# Patient Record
Sex: Female | Born: 1955 | Race: White | Hispanic: No | Smoking: Former smoker
Health system: Southern US, Community
[De-identification: ages and names within clinical notes are randomized; demographics above are authoritative.]

## PROBLEM LIST (undated history)

## (undated) DIAGNOSIS — I878 Other specified disorders of veins: Secondary | ICD-10-CM

## (undated) DIAGNOSIS — J449 Chronic obstructive pulmonary disease, unspecified: Secondary | ICD-10-CM

## (undated) DIAGNOSIS — M1990 Unspecified osteoarthritis, unspecified site: Secondary | ICD-10-CM

## (undated) DIAGNOSIS — R55 Syncope and collapse: Secondary | ICD-10-CM

## (undated) DIAGNOSIS — E119 Type 2 diabetes mellitus without complications: Secondary | ICD-10-CM

## (undated) DIAGNOSIS — F329 Major depressive disorder, single episode, unspecified: Secondary | ICD-10-CM

## (undated) DIAGNOSIS — M797 Fibromyalgia: Secondary | ICD-10-CM

## (undated) DIAGNOSIS — G2581 Restless legs syndrome: Secondary | ICD-10-CM

## (undated) DIAGNOSIS — G629 Polyneuropathy, unspecified: Secondary | ICD-10-CM

## (undated) DIAGNOSIS — E7800 Pure hypercholesterolemia, unspecified: Secondary | ICD-10-CM

## (undated) DIAGNOSIS — M79606 Pain in leg, unspecified: Secondary | ICD-10-CM

## (undated) DIAGNOSIS — G8929 Other chronic pain: Secondary | ICD-10-CM

## (undated) DIAGNOSIS — G4733 Obstructive sleep apnea (adult) (pediatric): Secondary | ICD-10-CM

## (undated) DIAGNOSIS — I10 Essential (primary) hypertension: Secondary | ICD-10-CM

## (undated) DIAGNOSIS — C649 Malignant neoplasm of unspecified kidney, except renal pelvis: Secondary | ICD-10-CM

## (undated) DIAGNOSIS — E039 Hypothyroidism, unspecified: Secondary | ICD-10-CM

## (undated) DIAGNOSIS — S129XXA Fracture of neck, unspecified, initial encounter: Secondary | ICD-10-CM

## (undated) DIAGNOSIS — N183 Chronic kidney disease, stage 3 unspecified: Secondary | ICD-10-CM

## (undated) DIAGNOSIS — G5600 Carpal tunnel syndrome, unspecified upper limb: Secondary | ICD-10-CM

## (undated) DIAGNOSIS — R1909 Other intra-abdominal and pelvic swelling, mass and lump: Secondary | ICD-10-CM

## (undated) DIAGNOSIS — I509 Heart failure, unspecified: Secondary | ICD-10-CM

## (undated) DIAGNOSIS — B1920 Unspecified viral hepatitis C without hepatic coma: Secondary | ICD-10-CM

## (undated) DIAGNOSIS — F32A Depression, unspecified: Secondary | ICD-10-CM

## (undated) HISTORY — DX: Malignant neoplasm of unspecified kidney, except renal pelvis: C64.9

## (undated) HISTORY — DX: Other chronic pain: G89.29

## (undated) HISTORY — PX: TONSILLECTOMY: SUR1361

## (undated) HISTORY — PX: ABDOMINAL HERNIA REPAIR: SHX539

## (undated) HISTORY — DX: Essential (primary) hypertension: I10

## (undated) HISTORY — DX: Major depressive disorder, single episode, unspecified: F32.9

## (undated) HISTORY — DX: Carpal tunnel syndrome, unspecified upper limb: G56.00

## (undated) HISTORY — PX: VAGINA SURGERY: SHX829

## (undated) HISTORY — PX: INCISIONAL HERNIA REPAIR: SHX193

## (undated) HISTORY — DX: Type 2 diabetes mellitus without complications: E11.9

## (undated) HISTORY — PX: WISDOM TOOTH EXTRACTION: SHX21

## (undated) HISTORY — DX: Hypothyroidism, unspecified: E03.9

## (undated) HISTORY — DX: Pain in leg, unspecified: M79.606

## (undated) HISTORY — DX: Obstructive sleep apnea (adult) (pediatric): G47.33

## (undated) HISTORY — DX: Other specified disorders of veins: I87.8

## (undated) HISTORY — DX: Morbid (severe) obesity due to excess calories: E66.01

## (undated) HISTORY — PX: BLADDER SUSPENSION: SHX72

## (undated) HISTORY — PX: ABDOMINAL HYSTERECTOMY: SHX81

## (undated) HISTORY — PX: CHOLECYSTECTOMY: SHX55

## (undated) HISTORY — PX: NEPHRECTOMY: SHX65

## (undated) HISTORY — PX: UMBILICAL HERNIA REPAIR: SHX196

## (undated) HISTORY — DX: Restless legs syndrome: G25.81

## (undated) HISTORY — PX: KNEE ARTHROSCOPY: SUR90

## (undated) HISTORY — DX: Syncope and collapse: R55

## (undated) HISTORY — PX: CYST EXCISION: SHX5701

## (undated) HISTORY — DX: Unspecified osteoarthritis, unspecified site: M19.90

## (undated) HISTORY — DX: Unspecified viral hepatitis C without hepatic coma: B19.20

## (undated) HISTORY — DX: Depression, unspecified: F32.A

## (undated) HISTORY — DX: Chronic kidney disease, stage 3 (moderate): N18.3

## (undated) HISTORY — DX: Polyneuropathy, unspecified: G62.9

## (undated) HISTORY — DX: Other intra-abdominal and pelvic swelling, mass and lump: R19.09

## (undated) HISTORY — DX: Chronic obstructive pulmonary disease, unspecified: J44.9

## (undated) HISTORY — DX: Chronic kidney disease, stage 3 unspecified: N18.30

## (undated) HISTORY — PX: TOOTH EXTRACTION: SUR596

## (undated) HISTORY — DX: Fibromyalgia: M79.7

## (undated) HISTORY — DX: Fracture of neck, unspecified, initial encounter: S12.9XXA

## (undated) HISTORY — DX: Pure hypercholesterolemia, unspecified: E78.00

---

## 2008-04-09 LAB — PROTIME-INR: INR: 1.3 — AB (ref 0.9–1.1)

## 2013-09-17 ENCOUNTER — Other Ambulatory Visit: Payer: Self-pay | Admitting: Pain Medicine

## 2013-09-17 DIAGNOSIS — M545 Low back pain, unspecified: Secondary | ICD-10-CM

## 2013-09-17 DIAGNOSIS — M542 Cervicalgia: Secondary | ICD-10-CM

## 2013-09-25 ENCOUNTER — Ambulatory Visit
Admission: RE | Admit: 2013-09-25 | Discharge: 2013-09-25 | Disposition: A | Discharge status: Home / Self Care | Payer: Commercial Managed Care - HMO | Source: Ambulatory Visit | Attending: Pain Medicine | Admitting: Pain Medicine

## 2013-09-25 DIAGNOSIS — M545 Low back pain, unspecified: Secondary | ICD-10-CM

## 2013-09-25 DIAGNOSIS — M542 Cervicalgia: Secondary | ICD-10-CM

## 2013-12-06 ENCOUNTER — Encounter: Payer: Self-pay | Admitting: Physician Assistant

## 2013-12-06 ENCOUNTER — Ambulatory Visit (INDEPENDENT_AMBULATORY_CARE_PROVIDER_SITE_OTHER): Payer: Commercial Managed Care - HMO | Admitting: Physician Assistant

## 2013-12-06 VITALS — BP 99/64 | HR 105 | Temp 98.1°F | Resp 16 | Ht 64.0 in | Wt 305.5 lb

## 2013-12-06 DIAGNOSIS — J449 Chronic obstructive pulmonary disease, unspecified: Secondary | ICD-10-CM

## 2013-12-06 DIAGNOSIS — Z13820 Encounter for screening for osteoporosis: Secondary | ICD-10-CM

## 2013-12-06 DIAGNOSIS — B354 Tinea corporis: Secondary | ICD-10-CM

## 2013-12-06 DIAGNOSIS — B182 Chronic viral hepatitis C: Secondary | ICD-10-CM

## 2013-12-06 DIAGNOSIS — Z794 Long term (current) use of insulin: Secondary | ICD-10-CM

## 2013-12-06 DIAGNOSIS — N189 Chronic kidney disease, unspecified: Secondary | ICD-10-CM

## 2013-12-06 DIAGNOSIS — L03119 Cellulitis of unspecified part of limb: Secondary | ICD-10-CM

## 2013-12-06 DIAGNOSIS — N3000 Acute cystitis without hematuria: Secondary | ICD-10-CM

## 2013-12-06 DIAGNOSIS — L02419 Cutaneous abscess of limb, unspecified: Secondary | ICD-10-CM

## 2013-12-06 DIAGNOSIS — J4489 Other specified chronic obstructive pulmonary disease: Secondary | ICD-10-CM

## 2013-12-06 DIAGNOSIS — E119 Type 2 diabetes mellitus without complications: Secondary | ICD-10-CM

## 2013-12-06 DIAGNOSIS — Z1239 Encounter for other screening for malignant neoplasm of breast: Secondary | ICD-10-CM

## 2013-12-06 DIAGNOSIS — E785 Hyperlipidemia, unspecified: Secondary | ICD-10-CM

## 2013-12-06 DIAGNOSIS — L03116 Cellulitis of left lower limb: Secondary | ICD-10-CM

## 2013-12-06 LAB — POCT URINALYSIS DIPSTICK
BILIRUBIN UA: NEGATIVE
GLUCOSE UA: 1000
Ketones, UA: NEGATIVE
NITRITE UA: NEGATIVE
Spec Grav, UA: 1.015
Urobilinogen, UA: 0.2
pH, UA: 6

## 2013-12-06 MED ORDER — CEPHALEXIN 500 MG PO CAPS
500.0000 mg | ORAL_CAPSULE | Freq: Two times a day (BID) | ORAL | Status: DC
Start: 2013-12-06 — End: 2013-12-07

## 2013-12-06 NOTE — Progress Notes (Signed)
Pre visit review using our clinic review tool, if applicable. No additional management support is needed unless otherwise documented below in the visit note/SLS  

## 2013-12-06 NOTE — Patient Instructions (Signed)
You will be contacted by the Pulmonologist and the Gastroenterologist for evaluation.  Please continue medications as directed.  Take Keflex as directed.  Stay well hydrated.  Take a probiotic and a cranberry supplement.  I will call you with your urine culture results.  We will switch antibiotic if indicated.  I will also call you once I have received your records from your previous doctor.     Cellulitis Cellulitis is an infection of the skin and the tissue beneath it. The infected area is usually red and tender. Cellulitis occurs most often in the arms and lower legs.  CAUSES  Cellulitis is caused by bacteria that enter the skin through cracks or cuts in the skin. The most common types of bacteria that cause cellulitis are staphylococci and streptococci. SIGNS AND SYMPTOMS   Redness and warmth.  Swelling.  Tenderness or pain.  Fever. DIAGNOSIS  Your health care provider can usually determine what is wrong based on a physical exam. Blood tests may also be done. TREATMENT  Treatment usually involves taking an antibiotic medicine. HOME CARE INSTRUCTIONS   Take your antibiotic medicine as directed by your health care provider. Finish the antibiotic even if you start to feel better.  Keep the infected arm or leg elevated to reduce swelling.  Apply a warm cloth to the affected area up to 4 times per day to relieve pain.  Take medicines only as directed by your health care provider.  Keep all follow-up visits as directed by your health care provider. SEEK MEDICAL CARE IF:   You notice red streaks coming from the infected area.  Your red area gets larger or turns dark in color.  Your bone or joint underneath the infected area becomes painful after the skin has healed.  Your infection returns in the same area or another area.  You notice a swollen bump in the infected area.  You develop new symptoms.  You have a fever. SEEK IMMEDIATE MEDICAL CARE IF:   You feel very  sleepy.  You develop vomiting or diarrhea.  You have a general ill feeling (malaise) with muscle aches and pains. MAKE SURE YOU:   Understand these instructions.  Will watch your condition.  Will get help right away if you are not doing well or get worse. Document Released: 02/10/2005 Document Revised: 09/17/2013 Document Reviewed: 07/19/2011 [facility] Patient Information 2015 [place], [place]. This information is not intended to replace advice given to you by your health care provider. Make sure you discuss any questions you have with your health care provider.

## 2013-12-06 NOTE — Progress Notes (Signed)
Patient presents to clinic today to establish care.  Acute Concerns: Patient complains of dysuria, urinary urgency, urinary frequency over the past week.  Denies hematuria, N/V, fever, chills or low back pain.  Patient also c/o pruritic yeast infection under the skin folds of abdomen, breast and in the inguinal region bilaterally.  Patient with history of epidermal candidiasis.  Is a diabetic.  Also with hepatitis C and unknown liver function status. Denies other immunocompromised state, such as HIV.  Chronic Issues: Type II Diabetes Mellitus -- Diagnosed in 2001.  Is currently on Lantus 30 units qam, Humalog 15 units TID, and Januvia 50 mg daily.  Endorses A1C last month < 9. Endorses AM glucose 100-110 and PM glucose ~ 120. Body mass index is 52.41 kg/(m^2)..  Is not currently followed by Endocrinology.  Overdue for Ophthalmologic exam. Endorses diabetic neuropathy for which her  Nortriptyline, taken for chronic pain, helps.  Hypothyroidism -- Unspecified cause.   Patient currently on Levothyroxine (generic) 75 mcg daily.  Endorses recent TSH with her previous PCP was good.   Hyperlipidemia -- Currently on Pravastatin.  Denies myalgias.  Has chronic back pain.  Patient with history of Hepatitis C but denies significant liver impairment.  Will need lipid panel and LFTs.  Will need records from previous PCP as patient endorses just having her physical.  COPD -- Unspecified stage.  Has never seen Pulmonology.  Endorses that her symptoms are predominantly due to chronic bronchitis and not emphysema.  Is currently on Albuterol PRN, Spiriva and Advair daily, and uses O2 at night and with ambulation.  O2 currently set on 2 L/min with good saturation.  Patient unsure if she has ever had PFTs.  Peripheral Edema -- denies hx of CHF.  Endorses history of CKD.  Possibly due to weight and other comorbidity.  Patient takes Lasix occasionally for edema.   Hepatitis C -- Endorses hx.  Denies known hepatic  impairment.  Will need records from previous PCP.  Patient denies seeing GI.  Will need referral.   Health Maintenance: Dental -- up-to-date Vision -- Overdue.  Needs Ophthalmologist. Immunizations --  Tetanus in 2007.   Colonoscopy --  Last Colonoscopy in 2010; no abnormal findings.  Due in 2020 Mammogram -- Due for mammogram.  Will send to [facility].  PAP -- s/p hysterectomy.  Followed by OB/GYN.  Bone Density -- Last DEXA in 2003.  No diagnosis of osteoporosis.  Patient s/p hysterectomy in  2010.   Past Medical History  Diagnosis Date  . Diabetes     Type II  . Neuropathy     Diabetes  . COPD (chronic obstructive pulmonary disease)   . Renal cancer     Left Kidney Removed  . Fibromyalgia   . Hepatitis C   . Abdominal mass of other site   . Chronic kidney disease, stage 3     Borderline Stage 2-3  . Depression   . Cervical compression fracture   . Hypercholesteremia   . Syncope   . Osteoarthritis   . Hypertension   . Hypothyroidism   . Chronic lower limb pain   . Morbid obesity   . CTS (carpal tunnel syndrome)   . RLS (restless legs syndrome)   . Venous stasis   . OSA (obstructive sleep apnea)     Past Surgical History  Procedure Laterality Date  . Nephrectomy      Left  . Umbilical hernia repair    . Abdominal hernia repair  x2  . Incisional hernia repair    . Abdominal hysterectomy    . Bladder suspension    . Vagina surgery    . Cholecystectomy    . Wisdom tooth extraction    . Tooth extraction    . Tonsillectomy    . Knee arthroscopy      Bilateral  . Cyst excision      Head    No current outpatient prescriptions on file prior to visit.   No current facility-administered medications on file prior to visit.    Allergies  Allergen Reactions  . Gabapentin Anaphylaxis  . Ketorolac Tromethamine Hives  . Lisinopril Cough    Family History  Problem Relation Age of Onset  . Heart attack Mother 2    Deceased  . Heart disease Mother    . Emphysema Mother   . COPD Father 68    Deceased  . Emphysema Father   . Alcoholism Father   . Alcoholism Mother   . Esophageal varices Father   . Alcoholism Paternal Grandfather   . Diabetes Maternal Grandmother   . Heart disease Maternal Grandmother   . Lung cancer Maternal Grandfather   . Emphysema Maternal Grandfather   . Brain cancer Maternal Aunt   . Diabetes Sister   . Heart defect Sister   . Obesity Son   . Breast cancer Maternal Aunt     History   Social History  . Marital Status: Single    Spouse Name: N/A    Number of Children: N/A  . Years of Education: N/A   Occupational History  . Not on file.   Social History Main Topics  . Smoking status: Former Smoker -- 2.50 packs/day for 45 years    Types: Cigarettes    Quit date: 09/25/2013  . Smokeless tobacco: Not on file  . Alcohol Use: Not on file  . Drug Use: Not on file  . Sexual Activity: Not on file   Other Topics Concern  . Not on file   Social History Narrative  . No narrative on file   ROS See HPI.  All other ROS are negative.  BP 99/64  Pulse 105  Temp(Src) 98.1 F (36.7 C) (Oral)  Resp 16  Ht 5\' 4"  (1.626 m)  Wt 305 lb 8 oz (138.574 kg)  BMI 52.41 kg/m2  SpO2 94%  Physical Exam  Vitals reviewed. Constitutional: She is oriented to person, place, and time and well-developed, well-nourished, and in no distress.  HENT:  Head: Normocephalic and atraumatic.  Right Ear: External ear normal.  Left Ear: External ear normal.  Nose: Nose normal.  Mouth/Throat: Oropharynx is clear and moist. No oropharyngeal exudate.  TM within normal limits bilaterally  Eyes: Conjunctivae are normal. Pupils are equal, round, and reactive to light.  Neck: Neck supple. No thyromegaly present.  Cardiovascular: Normal rate, regular rhythm, normal heart sounds and intact distal pulses.   Pulmonary/Chest: Effort normal. No respiratory distress. She has no wheezes. She has no rales. She exhibits no tenderness.   Lymphadenopathy:    She has no cervical adenopathy.  Neurological: She is alert and oriented to person, place, and time.  Skin: Skin is warm and dry.  Presence of tinea corporis rash of axillary regions, inguinal regions, and underneath breasts and abdominal pannus.  Presence of erythema, warmth and tenderness of left inguinal region; consistent with cellulitis.   Assessment/Plan: COPD (chronic obstructive pulmonary disease) with chronic bronchitis Continue current medication regimen.  Referral placed to Pulmonology for  evaluation and formal PFTs.  Chronic hepatitis C without hepatic coma Patient endorses hx of Hep C.  Thinks she has chronic.  Will check CMP for liver function and refer to a hepatic specialist for further workup and treatment.   Diabetes mellitus, type II, insulin dependent Continue current regimen.  Will obtain CMP and urine microalbumin.  Will obtain records from previous PCP to assess recent A1C.  Referral placed to Ophthalmology for diabetic eye examination.  Discussed appropriate foot care.  Continue Nortriptyline for neuropathy.  Acute cystitis without hematuria Ua + for nitrites and LE.  Will treat with Keflex. Will obtain culture.  Maintain hydration.  Daily probiotic and cranberry supplement.  CKD (chronic kidney disease) Will obtain CMP today. Will obtain records from previous PCP.   Cellulitis of leg, left Rx Keflex. Follow-up 1 week.  Breast cancer screening Order from screening mammogram placed.  Screening for osteoporosis DEXA ordered.  Hyperlipidemia LDL goal <100 Will check LFTS.  Will obtain records from previous PCP to see recent lipid panel.  Tinea corporis Rx lotrisone.  Continue Nystatin powder.  Hygiene measures discussed.  May need oral agent but will hold off for now until topical measure is attempted and for CMP to result.

## 2013-12-07 ENCOUNTER — Telehealth: Payer: Self-pay | Admitting: Physician Assistant

## 2013-12-07 MED ORDER — CEPHALEXIN 500 MG PO CAPS: 500.0000 mg | ORAL_CAPSULE | Freq: Two times a day (BID) | ORAL | Status: DC

## 2013-12-07 NOTE — Telephone Encounter (Signed)
Received medical records from [facility]

## 2013-12-09 LAB — CULTURE, URINE COMPREHENSIVE

## 2013-12-16 DIAGNOSIS — E1129 Type 2 diabetes mellitus with other diabetic kidney complication: Secondary | ICD-10-CM | POA: Insufficient documentation

## 2013-12-16 DIAGNOSIS — Z8619 Personal history of other infectious and parasitic diseases: Secondary | ICD-10-CM | POA: Insufficient documentation

## 2013-12-16 DIAGNOSIS — N189 Chronic kidney disease, unspecified: Secondary | ICD-10-CM | POA: Insufficient documentation

## 2013-12-16 DIAGNOSIS — L03116 Cellulitis of left lower limb: Secondary | ICD-10-CM | POA: Insufficient documentation

## 2013-12-16 DIAGNOSIS — N3000 Acute cystitis without hematuria: Secondary | ICD-10-CM | POA: Insufficient documentation

## 2013-12-16 DIAGNOSIS — Z13820 Encounter for screening for osteoporosis: Secondary | ICD-10-CM | POA: Insufficient documentation

## 2013-12-16 DIAGNOSIS — E785 Hyperlipidemia, unspecified: Secondary | ICD-10-CM | POA: Insufficient documentation

## 2013-12-16 DIAGNOSIS — J449 Chronic obstructive pulmonary disease, unspecified: Secondary | ICD-10-CM | POA: Insufficient documentation

## 2013-12-16 DIAGNOSIS — Z1239 Encounter for other screening for malignant neoplasm of breast: Secondary | ICD-10-CM | POA: Insufficient documentation

## 2013-12-16 DIAGNOSIS — B354 Tinea corporis: Secondary | ICD-10-CM | POA: Insufficient documentation

## 2013-12-16 NOTE — Assessment & Plan Note (Signed)
DEXA ordered.  

## 2013-12-16 NOTE — Assessment & Plan Note (Signed)
Rx Keflex. Follow-up 1 week.

## 2013-12-16 NOTE — Assessment & Plan Note (Signed)
Continue current medication regimen.  Referral placed to Pulmonology for evaluation and formal PFTs.

## 2013-12-16 NOTE — Assessment & Plan Note (Signed)
Ua + for nitrites and LE.  Will treat with Keflex. Will obtain culture.  Maintain hydration.  Daily probiotic and cranberry supplement.

## 2013-12-16 NOTE — Assessment & Plan Note (Signed)
Rx lotrisone.  Continue Nystatin powder.  Hygiene measures discussed.  May need oral agent but will hold off for now until topical measure is attempted and for CMP to result.

## 2013-12-16 NOTE — Assessment & Plan Note (Signed)
Will obtain CMP today. Will obtain records from previous PCP.

## 2013-12-16 NOTE — Assessment & Plan Note (Signed)
Patient endorses hx of Hep C.  Thinks she has chronic.  Will check CMP for liver function and refer to a hepatic specialist for further workup and treatment.

## 2013-12-16 NOTE — Assessment & Plan Note (Signed)
Continue current regimen.  Will obtain CMP and urine microalbumin.  Will obtain records from previous PCP to assess recent A1C.  Referral placed to Ophthalmology for diabetic eye examination.  Discussed appropriate foot care.  Continue Nortriptyline for neuropathy.

## 2013-12-16 NOTE — Assessment & Plan Note (Signed)
Order from screening mammogram placed.

## 2013-12-16 NOTE — Assessment & Plan Note (Signed)
Will check LFTS.  Will obtain records from previous PCP to see recent lipid panel.

## 2013-12-25 ENCOUNTER — Encounter: Payer: Self-pay | Admitting: Internal Medicine

## 2013-12-25 ENCOUNTER — Ambulatory Visit (INDEPENDENT_AMBULATORY_CARE_PROVIDER_SITE_OTHER): Payer: Commercial Managed Care - HMO | Admitting: Internal Medicine

## 2013-12-25 VITALS — BP 126/68 | HR 108 | Ht 65.0 in | Wt 296.0 lb

## 2013-12-25 DIAGNOSIS — J449 Chronic obstructive pulmonary disease, unspecified: Secondary | ICD-10-CM

## 2013-12-25 NOTE — Patient Instructions (Addendum)
Continue spiriva each am automatically   Only use your albuterol (proair) as a rescue medication to be used if you can't catch your breath by resting or doing a relaxed purse lip breathing pattern.  - The less you use it, the better it will work when you need it. - Ok to use up to 2 puffs  every 4 hours if you must but call for immediate appointment if use goes up over your usual need - Don't leave home without it !!  (think of it like the spare tire for your car)   Only use your albuterol nebulizer after trying the proair first   Adjust the 02 for a sat over 90% but continue at bedtime x 2.5 lpm   Please schedule a follow up office visit in 4 weeks, sooner if needed for pfts on return Add needs cxr on return

## 2013-12-25 NOTE — Progress Notes (Signed)
   Subjective:    Patient ID: Renee Noble, female    DOB: [birth year],    MRN: [id number]  HPI  16 yowf quit smoking 09/2013 Mothers day referred by Renee Noble to pulmonary clinic for copd evaluation 12/25/2013   12/25/2013 1st [facility] office visit/ Renee Noble on spiriva and saba rarely/ not consistent with advair Chief Complaint  Patient presents with  . Advice Only    Referred by Renee Noble for COPD.  Was dx'ed with copd in 2000. Has been on cpap X13 years.    doe x whole HT some better on 02, worse in heat  Did have a lot of chest congestion now better since quit smoking  No obvious other patterns in day to day or daytime variabilty or cp or chest tightness, subjective wheeze overt sinus or hb symptoms. No unusual exp hx or h/o childhood pna/ asthma or knowledge of premature birth.  Sleeping ok without nocturnal  or early am exacerbation  of respiratory  c/o's or need for noct saba. Also denies any obvious fluctuation of symptoms with weather or environmental changes or other aggravating or alleviating factors except as outlined above   Current Medications, Allergies, Complete Past Medical History, Past Surgical History, Family History, and Social History were reviewed in [employer] record.             Review of Systems  Constitutional: Negative for fever and unexpected weight change.  HENT: Negative for congestion, dental problem, ear pain, nosebleeds, postnasal drip, rhinorrhea, sinus pressure, sneezing, sore throat and trouble swallowing.   Eyes: Negative for redness and itching.  Respiratory: Positive for chest tightness, shortness of breath and wheezing. Negative for cough.   Cardiovascular: Negative for palpitations and leg swelling.  Gastrointestinal: Negative for nausea and vomiting.  Genitourinary: Negative for dysuria.  Musculoskeletal: Negative for joint swelling.  Skin: Negative for rash.  Neurological: Negative for headaches.    Hematological: Does not bruise/bleed easily.  Psychiatric/Behavioral: Negative for dysphoric mood. The patient is not nervous/anxious.        Objective:   Physical Exam  Wt Readings from Last 3 Encounters:  12/25/13 296 lb (134.265 kg)  12/06/13 305 lb 8 oz (138.574 kg)    amb obese wf nad  Full dentures  HEENT: nl dentition, turbinates, and orophanx. Nl external ear canals without cough reflex   NECK :  without JVD/Nodes/TM/ nl carotid upstrokes bilaterally   LUNGS: no acc muscle use, clear to A and P bilaterally without cough on insp or exp maneuvers   CV:  RRR  no s3 or murmur or increase in P2, no edema   ABD:  soft and nontender with nl excursion in the supine position. No bruits or organomegaly, bowel sounds nl  MS:  warm without deformities, calf tenderness, cyanosis or clubbing  SKIN: warm and dry without lesions    NEURO:  alert, approp, no deficits    cxr not on file            Assessment & Plan:

## 2013-12-27 NOTE — Assessment & Plan Note (Addendum)
She appears to have at least moderate dz though her weight probably contributing just as much to her doe - note Try ok to try  off advair   as not using it consistently anyway and now that not smoking does not have much in terms of an AB component anyway   Reinforced importance of maintaining off cigs at all cost and returning for full pfts in one month    Each maintenance medication was reviewed in detail including most importantly the difference between maintenance and as needed and under what circumstances the prns are to be used.  Please see instructions for details which were reviewed in writing and the patient given a copy.    The proper method of use, as well as anticipated side effects, of a metered-dose inhaler are discussed and demonstrated to the patient. Improved effectiveness after extensive coaching during this visit to a level of approximately  75%

## 2013-12-27 NOTE — Addendum Note (Signed)
Addended by: Christinia Gully B on: 12/27/2013 07:24 AM   Modules accepted: Orders

## 2014-01-02 ENCOUNTER — Encounter: Payer: Self-pay | Admitting: Physician Assistant

## 2014-01-15 ENCOUNTER — Telehealth: Payer: Self-pay | Admitting: Physician Assistant

## 2014-01-15 DIAGNOSIS — E2839 Other primary ovarian failure: Secondary | ICD-10-CM

## 2014-01-15 NOTE — Telephone Encounter (Signed)
Order changed.

## 2014-01-25 ENCOUNTER — Ambulatory Visit: Payer: Commercial Managed Care - HMO | Admitting: Internal Medicine

## 2014-02-14 ENCOUNTER — Encounter: Payer: Self-pay | Admitting: Internal Medicine

## 2014-02-14 ENCOUNTER — Ambulatory Visit (INDEPENDENT_AMBULATORY_CARE_PROVIDER_SITE_OTHER): Payer: Commercial Managed Care - HMO | Admitting: Internal Medicine

## 2014-02-14 ENCOUNTER — Ambulatory Visit (INDEPENDENT_AMBULATORY_CARE_PROVIDER_SITE_OTHER)
Admission: RE | Admit: 2014-02-14 | Discharge: 2014-02-14 | Disposition: A | Discharge status: Home / Self Care | Payer: Commercial Managed Care - HMO | Source: Ambulatory Visit | Attending: Internal Medicine | Admitting: Internal Medicine

## 2014-02-14 VITALS — BP 120/90 | HR 106 | Temp 98.1°F | Ht 64.0 in | Wt 300.0 lb

## 2014-02-14 DIAGNOSIS — R938 Abnormal findings on diagnostic imaging of other specified body structures: Secondary | ICD-10-CM

## 2014-02-14 DIAGNOSIS — J449 Chronic obstructive pulmonary disease, unspecified: Secondary | ICD-10-CM

## 2014-02-14 DIAGNOSIS — R9389 Abnormal findings on diagnostic imaging of other specified body structures: Secondary | ICD-10-CM

## 2014-02-14 LAB — PULMONARY FUNCTION TEST
DL/VA % pred: 84 %
DL/VA: 4.07 ml/min/mmHg/L
DLCO UNC % PRED: 73 %
DLCO UNC: 17.79 ml/min/mmHg
FEF 25-75 Post: 0.57 L/sec
FEF 25-75 Pre: 0.42 L/sec
FEF2575-%Change-Post: 33 %
FEF2575-%Pred-Post: 24 %
FEF2575-%Pred-Pre: 18 %
FEV1-%Change-Post: 12 %
FEV1-%PRED-POST: 48 %
FEV1-%Pred-Pre: 42 %
FEV1-Post: 1.22 L
FEV1-Pre: 1.09 L
FEV1FVC-%CHANGE-POST: 6 %
FEV1FVC-%Pred-Pre: 64 %
FEV6-%Change-Post: 5 %
FEV6-%PRED-PRE: 65 %
FEV6-%Pred-Post: 69 %
FEV6-POST: 2.2 L
FEV6-PRE: 2.08 L
FEV6FVC-%CHANGE-POST: 0 %
FEV6FVC-%Pred-Post: 99 %
FEV6FVC-%Pred-Pre: 99 %
FVC-%CHANGE-POST: 5 %
FVC-%PRED-PRE: 65 %
FVC-%Pred-Post: 69 %
FVC-PRE: 2.17 L
FVC-Post: 2.29 L
POST FEV6/FVC RATIO: 96 %
PRE FEV6/FVC RATIO: 96 %
Post FEV1/FVC ratio: 53 %
Pre FEV1/FVC ratio: 50 %
RV % PRED: 185 %
RV: 3.72 L
TLC % PRED: 120 %
TLC: 6.07 L

## 2014-02-14 NOTE — Patient Instructions (Addendum)
Weight control is simply a matter of calorie balance which needs to be tilted in your favor by eating less and exercising more.  To get the most out of exercise, you need to be continuously aware that you are short of breath, but never out of breath, for 30 minutes daily. As you improve, it will actually be easier for you to do the same amount of exercise  in  30 minutes so always push to the level where you are short of breath.  If this does not result in gradual weight reduction then I strongly recommend you see a nutritionist with a food diary x 2 weeks so that we can work out a negative calorie balance which is universally effective in steady weight loss programs.  Think of your calorie balance like you do your bank account where in this case you want the balance to go down so you must take in less calories than you burn up.  It's just that simple:  Hard to do, but easy to understand.  Good luck!   No change in medications   Call us if desire referral to pulmonary rehab    If you are satisfied with your treatment plan,  let your doctor know and he/she can either refill your medications or you can return here when your prescription runs out.     If in any way you are not 100% satisfied,  please tell us.  If 100% better, tell your friends!  Please remember to go to the   x-ray department downstairs for your tests - we will call you with the results when they are available.     Pulmonary follow up is as needed

## 2014-02-14 NOTE — Progress Notes (Signed)
Subjective:    Patient ID: Renee Noble, female    DOB: [birth year],    MRN: [id number]    Brief patient profile:  58 yowf quit smoking 09/2013 on  Mothers day referred by [name] to pulmonary clinic for copd evaluation 12/25/2013   12/25/2013 1st [facility] office visit/ Wert on spiriva and saba rarely/ not consistent with advair Chief Complaint  Patient presents with  . Advice Only    Referred by [name] for COPD.  Was dx'ed with copd in 2000. Has been on cpap X13 years.   doe x whole HT some better on 02, worse in heat  Did have a lot of chest congestion now better since quit smoking rec Continue spiriva each am automatically  Only use your albuterol (proair) as a rescue medication  Only use your albuterol nebulizer after trying the proair first  Adjust the 02 for a sat over 90% but continue at bedtime x 2.5 lpm       02/14/2014 f/u ov/Wert re: GOLD III spiriva and advair  Chief Complaint  Patient presents with  . Follow-up    PFT done today. Pt states that her breathing is doing well. She uses rescue inhaler approx 1 x per wk.   doe a little better since started back on advair and only use saba maybe once a week    Not using 02 at all    No obvious day to day or daytime variabilty or assoc chronic cough or cp or chest tightness, subjective wheeze overt sinus or hb symptoms. No unusual exp hx or h/o childhood pna/ asthma or knowledge of premature birth.  Sleeping ok without nocturnal  or early am exacerbation  of respiratory  c/o's or need for noct saba. Also denies any obvious fluctuation of symptoms with weather or environmental changes or other aggravating or alleviating factors except as outlined above   Current Medications, Allergies, Complete Past Medical History, Past Surgical History, Family History, and Social History were reviewed in [employer] record.  ROS  The following are not active complaints unless bolded sore throat,  dysphagia, dental problems, itching, sneezing,  nasal congestion or excess/ purulent secretions, ear ache,   fever, chills, sweats, unintended wt loss, pleuritic or exertional cp, hemoptysis,  orthopnea pnd or leg swelling, presyncope, palpitations, heartburn, abdominal pain, anorexia, nausea, vomiting, diarrhea  or change in bowel or urinary habits, change in stools or urine, dysuria,hematuria,  rash, arthralgias, visual complaints, headache, numbness weakness or ataxia or problems with walking or coordination,  change in mood/affect or memory.                        Objective:   Physical Exam   Wt Readings from Last 3 Encounters:  02/14/14 300 lb (136.079 kg)  12/25/13 296 lb (134.265 kg)  12/06/13 305 lb 8 oz (138.574 kg)       amb obese wf nad    HEENT: Full dentures -  nl  turbinates, and orophanx. Nl external ear canals without cough reflex   NECK :  without JVD/Nodes/TM/ nl carotid upstrokes bilaterally   LUNGS: no acc muscle use, clear to A and P bilaterally without cough on insp or exp maneuvers   CV:  RRR  no s3 or murmur or increase in P2, no edema   ABD:  soft and nontender with nl excursion in the supine position. No bruits or organomegaly, bowel sounds nl  MS:  warm without  deformities, calf tenderness, cyanosis or clubbing  SKIN: warm and dry without lesions    NEURO:  alert, approp, no deficits      CXR  02/14/2014 :  Left basilar opacity may reflect acute process such as pneumonia,  although partial lobar collapse or mass is included on the  differential            Assessment & Plan:

## 2014-02-14 NOTE — Assessment & Plan Note (Addendum)
-   02/14/2014 PFTs  FEV1  1.22 (48%) ratio 53 p 12% improvement from SABA and dlco 73% corrects to 84  - 02/15/2014  Walked RA  2 laps @ 185 ft each stopped due to  89% /   nl pace / legs gave out same time chest got "tight" relieved immediately at rest   Adequate control on present rx, reviewed > no change in rx needed

## 2014-02-14 NOTE — Progress Notes (Signed)
PFT done today. 

## 2014-02-15 ENCOUNTER — Encounter: Payer: Self-pay | Admitting: Internal Medicine

## 2014-02-15 ENCOUNTER — Telehealth: Payer: Self-pay | Admitting: Internal Medicine

## 2014-02-15 DIAGNOSIS — R9389 Abnormal findings on diagnostic imaging of other specified body structures: Secondary | ICD-10-CM | POA: Insufficient documentation

## 2014-02-15 NOTE — Progress Notes (Signed)
Quick Note:  Dr Melvyn Novas has called and discussed this with the pt ______

## 2014-02-15 NOTE — Assessment & Plan Note (Signed)
?   Hernia vs atx > CT chest rec

## 2014-04-16 ENCOUNTER — Other Ambulatory Visit: Payer: Self-pay | Admitting: Physician Assistant

## 2014-04-16 NOTE — Telephone Encounter (Signed)
Caller name: Loghan, Subia Relation to pt: self  Call back number: [phone] Pharmacy: [facility]   Reason for call:  levothyroxine (SYNTHROID, LEVOTHROID) 75 MCG tablet  tiotropium (SPIRIVA) 18 MCG inhalation capsule  nortriptyline (PAMELOR) 75 MG capsule pravastatin (PRAVACHOL) 20 MG tablet  albuterol (PROVENTIL HFA;VENTOLIN HFA) 108 (90 BASE) MCG/ACT inhaler Fluticasone-Salmeterol (ADVAIR) 500-50 MCG/DOSE AEPB insulin lispro (HUMALOG) 100 UNIT/ML injection insulin glargine (LANTUS) 100 UNIT/ML injection zolpidem (AMBIEN) 10 MG tablet sitaGLIPtin (JANUVIA) 50 MG tablet insulin needles 1 millimeters length 12.7 millimeters 30 gage needle

## 2014-04-17 NOTE — Telephone Encounter (Signed)
Cellulitis of leg, left - [name], PA-C at 12/16/2013 6:32 PM     Status: Written Related Problem: Cellulitis of leg, left   Expand All Collapse All   Rx Keflex. Follow-up 1 week

## 2014-04-23 ENCOUNTER — Telehealth: Payer: Self-pay

## 2014-04-23 MED ORDER — "INSULIN SYRINGE 30G X 1/2"" 0.5 ML MISC": Status: DC

## 2014-04-23 MED ORDER — INSULIN GLARGINE 100 UNIT/ML ~~LOC~~ SOLN: 30.0000 [IU] | Freq: Every day | SUBCUTANEOUS | Status: DC

## 2014-04-23 MED ORDER — ZOLPIDEM TARTRATE 10 MG PO TABS: 10.0000 mg | ORAL_TABLET | Freq: Every evening | ORAL | Status: DC | PRN

## 2014-04-23 MED ORDER — SITAGLIPTIN PHOSPHATE 50 MG PO TABS: 50.0000 mg | ORAL_TABLET | Freq: Every day | ORAL | Status: DC

## 2014-04-23 MED ORDER — TIOTROPIUM BROMIDE MONOHYDRATE 18 MCG IN CAPS: 18.0000 ug | ORAL_CAPSULE | Freq: Every day | RESPIRATORY_TRACT | Status: DC

## 2014-04-23 MED ORDER — FLUTICASONE-SALMETEROL 500-50 MCG/DOSE IN AEPB: 1.0000 | INHALATION_SPRAY | Freq: Once | RESPIRATORY_TRACT | Status: DC

## 2014-04-23 MED ORDER — INSULIN LISPRO 100 UNIT/ML ~~LOC~~ SOLN: 15.0000 [IU] | Freq: Three times a day (TID) | SUBCUTANEOUS | Status: DC

## 2014-04-23 MED ORDER — ALBUTEROL SULFATE HFA 108 (90 BASE) MCG/ACT IN AERS: 1.0000 | INHALATION_SPRAY | Freq: Four times a day (QID) | RESPIRATORY_TRACT | Status: DC | PRN

## 2014-04-23 MED ORDER — NORTRIPTYLINE HCL 75 MG PO CAPS: 75.0000 mg | ORAL_CAPSULE | Freq: Every day | ORAL | Status: DC

## 2014-04-23 MED ORDER — PRAVASTATIN SODIUM 20 MG PO TABS: 20.0000 mg | ORAL_TABLET | Freq: Every day | ORAL | Status: DC

## 2014-04-23 MED ORDER — LEVOTHYROXINE SODIUM 75 MCG PO TABS: 75.0000 ug | ORAL_TABLET | Freq: Every day | ORAL | Status: DC

## 2014-04-23 NOTE — Telephone Encounter (Signed)
Patient does not have a voice mail set up with [facility] phone.  Needs to schedule appt for CPE. Can send in 30 day refills to local pharmacy until she can be seen in office with [facility].

## 2014-04-23 NOTE — Telephone Encounter (Signed)
Per [name] to refill pts medications only for one month since pt is overdue for appointment. Spoke with pts relative margaret ,  pt to call back to schedule follow - up appointment.

## 2014-04-25 NOTE — Telephone Encounter (Signed)
Patient does not have a voice mail set up on [name] phone

## 2014-04-28 ENCOUNTER — Other Ambulatory Visit: Payer: Commercial Managed Care - HMO

## 2014-04-29 ENCOUNTER — Ambulatory Visit: Payer: Commercial Managed Care - HMO | Admitting: Physician Assistant

## 2014-04-29 ENCOUNTER — Telehealth: Payer: Self-pay | Admitting: *Deleted

## 2014-04-29 NOTE — Telephone Encounter (Signed)
Pt did not show for appointment 04/29/2014 at 3:30pm for med check. Pt called 04/29/14 at 2:21pm and rescheduled for 04/30/14 at 4:00pm

## 2014-04-29 NOTE — Telephone Encounter (Signed)
Patient is scheduled to see PCP 04/30/2014. Ambien Rx returned to provider.

## 2014-04-30 ENCOUNTER — Encounter: Payer: Self-pay | Admitting: Physician Assistant

## 2014-04-30 ENCOUNTER — Ambulatory Visit (INDEPENDENT_AMBULATORY_CARE_PROVIDER_SITE_OTHER): Payer: Commercial Managed Care - HMO | Admitting: Physician Assistant

## 2014-04-30 VITALS — BP 120/71 | HR 99 | Temp 99.0°F | Resp 16 | Ht 64.0 in | Wt 295.4 lb

## 2014-04-30 DIAGNOSIS — E785 Hyperlipidemia, unspecified: Secondary | ICD-10-CM

## 2014-04-30 DIAGNOSIS — Z794 Long term (current) use of insulin: Secondary | ICD-10-CM

## 2014-04-30 DIAGNOSIS — B354 Tinea corporis: Secondary | ICD-10-CM

## 2014-04-30 DIAGNOSIS — E119 Type 2 diabetes mellitus without complications: Secondary | ICD-10-CM

## 2014-04-30 DIAGNOSIS — N189 Chronic kidney disease, unspecified: Secondary | ICD-10-CM

## 2014-04-30 DIAGNOSIS — B182 Chronic viral hepatitis C: Secondary | ICD-10-CM

## 2014-04-30 DIAGNOSIS — J449 Chronic obstructive pulmonary disease, unspecified: Secondary | ICD-10-CM

## 2014-04-30 MED ORDER — NORTRIPTYLINE HCL 75 MG PO CAPS: 75.0000 mg | ORAL_CAPSULE | Freq: Every day | ORAL | Status: DC

## 2014-04-30 MED ORDER — INSULIN LISPRO 100 UNIT/ML ~~LOC~~ SOLN: 15.0000 [IU] | Freq: Three times a day (TID) | SUBCUTANEOUS | Status: DC

## 2014-04-30 MED ORDER — "INSULIN SYRINGE-NEEDLE U-100 30G X 1/2"" 1 ML MISC": Status: DC

## 2014-04-30 MED ORDER — FUROSEMIDE 20 MG PO TABS: 20.0000 mg | ORAL_TABLET | Freq: Every day | ORAL | Status: DC | PRN

## 2014-04-30 MED ORDER — FLUTICASONE-SALMETEROL 500-50 MCG/DOSE IN AEPB: 1.0000 | INHALATION_SPRAY | Freq: Once | RESPIRATORY_TRACT | Status: DC

## 2014-04-30 MED ORDER — CLOTRIMAZOLE-BETAMETHASONE 1-0.05 % EX CREA: 1.0000 "application " | TOPICAL_CREAM | Freq: Two times a day (BID) | CUTANEOUS | Status: DC

## 2014-04-30 MED ORDER — INSULIN GLARGINE 100 UNIT/ML ~~LOC~~ SOLN: 30.0000 [IU] | Freq: Every day | SUBCUTANEOUS | Status: DC

## 2014-04-30 MED ORDER — SITAGLIPTIN PHOSPHATE 50 MG PO TABS: 50.0000 mg | ORAL_TABLET | Freq: Every day | ORAL | Status: DC

## 2014-04-30 MED ORDER — LEVOTHYROXINE SODIUM 75 MCG PO TABS: 75.0000 ug | ORAL_TABLET | Freq: Every day | ORAL | Status: DC

## 2014-04-30 MED ORDER — ALBUTEROL SULFATE HFA 108 (90 BASE) MCG/ACT IN AERS: 1.0000 | INHALATION_SPRAY | Freq: Four times a day (QID) | RESPIRATORY_TRACT | Status: DC | PRN

## 2014-04-30 MED ORDER — TIOTROPIUM BROMIDE MONOHYDRATE 18 MCG IN CAPS: 18.0000 ug | ORAL_CAPSULE | Freq: Every day | RESPIRATORY_TRACT | Status: DC

## 2014-04-30 MED ORDER — PRAVASTATIN SODIUM 20 MG PO TABS
20.0000 mg | ORAL_TABLET | Freq: Every day | ORAL | Status: DC
Start: 2014-04-30 — End: 2014-12-10

## 2014-04-30 NOTE — Patient Instructions (Signed)
Please stop by the lab for blood work.  Please continue medications as directed.  I will call you with your results.  I will also call you when I have spoken to Dr. Alonza Bogus and have ordered the imaging he has requested.  Follow-up in 3 months.  Diabetes and Foot Care Diabetes may cause you to have problems because of poor blood supply (circulation) to your feet and legs. This may cause the skin on your feet to become thinner, break easier, and heal more slowly. Your skin may become dry, and the skin may peel and crack. You may also have nerve damage in your legs and feet causing decreased feeling in them. You may not notice minor injuries to your feet that could lead to infections or more serious problems. Taking care of your feet is one of the most important things you can do for yourself.  HOME CARE INSTRUCTIONS  Wear shoes at all times, even in the house. Do not go barefoot. Bare feet are easily injured.  Check your feet daily for blisters, cuts, and redness. If you cannot see the bottom of your feet, use a mirror or ask someone for help.  Wash your feet with warm water (do not use hot water) and mild soap. Then pat your feet and the areas between your toes until they are completely dry. Do not soak your feet as this can dry your skin.  Apply a moisturizing lotion or petroleum jelly (that does not contain alcohol and is unscented) to the skin on your feet and to dry, brittle toenails. Do not apply lotion between your toes.  Trim your toenails straight across. Do not dig under them or around the cuticle. File the edges of your nails with an emery board or nail file.  Do not cut corns or calluses or try to remove them with medicine.  Wear clean socks or stockings every day. Make sure they are not too tight. Do not wear knee-high stockings since they may decrease blood flow to your legs.  Wear shoes that fit properly and have enough cushioning. To break in new shoes, wear them for just a  few hours a day. This prevents you from injuring your feet. Always look in your shoes before you put them on to be sure there are no objects inside.  Do not cross your legs. This may decrease the blood flow to your feet.  If you find a minor scrape, cut, or break in the skin on your feet, keep it and the skin around it clean and dry. These areas may be cleansed with mild soap and water. Do not cleanse the area with peroxide, alcohol, or iodine.  When you remove an adhesive bandage, be sure not to damage the skin around it.  If you have a wound, look at it several times a day to make sure it is healing.  Do not use heating pads or hot water bottles. They may burn your skin. If you have lost feeling in your feet or legs, you may not know it is happening until it is too late.  Make sure your health care provider performs a complete foot exam at least annually or more often if you have foot problems. Report any cuts, sores, or bruises to your health care provider immediately. SEEK MEDICAL CARE IF:   You have an injury that is not healing.  You have cuts or breaks in the skin.  You have an ingrown nail.  You notice redness on your  legs or feet.  You feel burning or tingling in your legs or feet.  You have pain or cramps in your legs and feet.  Your legs or feet are numb.  Your feet always feel cold. SEEK IMMEDIATE MEDICAL CARE IF:   There is increasing redness, swelling, or pain in or around a wound.  There is a red line that goes up your leg.  Pus is coming from a wound.  You develop a fever or as directed by your health care provider.  You notice a bad smell coming from an ulcer or wound. Document Released: 04/30/2000 Document Revised: 01/03/2013 Document Reviewed: 10/10/2012 [facility] Patient Information 2015 [place], [place]. This information is not intended to replace advice given to you by your health care provider. Make sure you discuss any questions you have with your  health care provider.

## 2014-04-30 NOTE — Progress Notes (Signed)
Patient presents to clinic today for medication management.  Diabetes Mellitus, II -- Endorses AM fasting sugars averaging 100-120.  Post-prandial glucose averaging 130-140.  Is taking 30 units of Lantus daily and 15 units of Humalog with meals.  Is continuing oral agents.   Hyperlipidemia -- Is continuing Pravachol as directed without myalgias.  Hepatitis C -- Has been set up with Dr. Alonza Bogus and has started treatment.  Has upcoming imaging to further assess an asymptomatic abdominal hernia. Denies pain, nausea or vomiting.  Denies dark urine or yellowing of skins.   Past Medical History  Diagnosis Date  . Diabetes     Type II  . Neuropathy     Diabetes  . COPD (chronic obstructive pulmonary disease)   . Renal cancer     Left Kidney Removed  . Fibromyalgia   . Hepatitis C   . Abdominal mass of other site   . Chronic kidney disease, stage 3     Borderline Stage 2-3  . Depression   . Cervical compression fracture   . Hypercholesteremia   . Syncope   . Osteoarthritis   . Hypertension   . Hypothyroidism   . Chronic lower limb pain   . Morbid obesity   . CTS (carpal tunnel syndrome)   . RLS (restless legs syndrome)   . Venous stasis   . OSA (obstructive sleep apnea)     Current Outpatient Prescriptions on File Prior to Visit  Medication Sig Dispense Refill  . Bisacodyl (LAXATIVE PO) Take by mouth as needed.    . nystatin (MYCOSTATIN/NYSTOP) 100000 UNIT/GM POWD Apply topically.    . Oxycodone HCl 10 MG TABS Take 10 mg by mouth 4 (four) times daily as needed.     . polycarbophil (FIBERCON) 625 MG tablet Take 625 mg by mouth daily.    [name] Kitchen zolpidem (AMBIEN) 10 MG tablet Take 1 tablet (10 mg total) by mouth at bedtime as needed for sleep. 30 tablet 0   No current facility-administered medications on file prior to visit.    Allergies  Allergen Reactions  . Gabapentin Anaphylaxis  . Ketorolac Tromethamine Hives  . Lisinopril Cough    Family History  Problem Relation  Age of Onset  . Heart attack Mother 21    Deceased  . Heart disease Mother   . Emphysema Mother   . COPD Father 30    Deceased  . Emphysema Father   . Alcoholism Father   . Alcoholism Mother   . Esophageal varices Father   . Alcoholism Paternal Grandfather   . Diabetes Maternal Grandmother   . Heart disease Maternal Grandmother   . Lung cancer Maternal Grandfather   . Emphysema Maternal Grandfather   . Brain cancer Maternal Aunt   . Diabetes Sister   . Heart defect Sister   . Obesity Son   . Breast cancer Maternal Aunt     History   Social History  . Marital Status: Single    Spouse Name: N/A    Number of Children: N/A  . Years of Education: N/A   Social History Main Topics  . Smoking status: Former Smoker -- 2.50 packs/day for 45 years    Types: Cigarettes    Quit date: 09/25/2013  . Smokeless tobacco: Never Used  . Alcohol Use: None  . Drug Use: None  . Sexual Activity: None   Other Topics Concern  . None   Social History Narrative   Review of Systems - See HPI.  All other ROS  are negative.  BP 120/71 mmHg  Pulse 99  Temp(Src) 99 F (37.2 C) (Oral)  Resp 16  Ht _0  (1.626 m)  Wt 295 lb 6 oz (133.981 kg)  BMI 50.68 kg/m2  SpO2 98%  Physical Exam  Constitutional: She is oriented to person, place, and time and well-developed, well-nourished, and in no distress.  HENT:  Head: Normocephalic and atraumatic.  Eyes: Conjunctivae are normal. Pupils are equal, round, and reactive to light.  Neck: Neck supple.  Cardiovascular: Normal rate, regular rhythm, normal heart sounds and intact distal pulses.   Pulmonary/Chest: Effort normal and breath sounds normal. No respiratory distress. She has no wheezes. She has no rales. She exhibits no tenderness.  Neurological: She is alert and oriented to person, place, and time.  Skin: Skin is warm and dry. No rash noted.  Psychiatric: Affect normal.  Vitals reviewed.  Recent Results (from the past 2160 hour(s))    Pulmonary function test     Status: None   Collection Time: 02/14/14 11:47 AM  Result Value Ref Range   FVC-Pre 2.17 L   FVC-%Pred-Pre 65 %   FVC-Post 2.29 L   FVC-%Pred-Post 69 %   FVC-%Change-Post 5 %   FEV1-Pre 1.09 L   FEV1-%Pred-Pre 42 %   FEV1-Post 1.22 L   FEV1-%Pred-Post 48 %   FEV1-%Change-Post 12 %   FEV6-Pre 2.08 L   FEV6-%Pred-Pre 65 %   FEV6-Post 2.20 L   FEV6-%Pred-Post 69 %   FEV6-%Change-Post 5 %   Pre FEV1/FVC ratio 50 %   FEV1FVC-%Pred-Pre 64 %   Post FEV1/FVC ratio 53 %   FEV1FVC-%Change-Post 6 %   Pre FEV6/FVC Ratio 96 %   FEV6FVC-%Pred-Pre 99 %   Post FEV6/FVC ratio 96 %   FEV6FVC-%Pred-Post 99 %   FEV6FVC-%Change-Post 0 %   FEF 25-75 Pre 0.42 L/sec   FEF2575-%Pred-Pre 18 %   FEF 25-75 Post 0.57 L/sec   FEF2575-%Pred-Post 24 %   FEF2575-%Change-Post 33 %   RV 3.72 L   RV % pred 185 %   TLC 6.07 L   TLC % pred 120 %   DLCO unc 17.79 ml/min/mmHg   DLCO unc % pred 73 %   DL/VA 4.07 ml/min/mmHg/L   DL/VA % pred 84 %  Hemoglobin A1c     Status: Abnormal   Collection Time: 04/30/14  4:25 PM  Result Value Ref Range   Hgb A1c MFr Bld 7.7 (H) 4.6 - 6.5 %    Comment: Glycemic Control Guidelines for People with Diabetes:Non Diabetic:  <6%Goal of Therapy: <7%Additional Action Suggested:  >8%   Urinalysis, Routine w reflex microscopic     Status: Abnormal   Collection Time: 04/30/14  4:25 PM  Result Value Ref Range   Color, Urine YELLOW Yellow;Lt. Yellow   APPearance Sl Cloudy (A) Clear   Specific Gravity, Urine 1.010 1.000-1.030   pH 6.5 5.0 - 8.0   Total Protein, Urine NEGATIVE Negative   Urine Glucose >=1000 (A) Negative   Ketones, ur NEGATIVE Negative   Bilirubin Urine NEGATIVE Negative   Hgb urine dipstick NEGATIVE Negative   Urobilinogen, UA 0.2 0.0 - 1.0   Leukocytes, UA NEGATIVE Negative   Nitrite NEGATIVE Negative   WBC, UA 0-2/hpf 0-2/hpf   RBC / HPF 0-2/hpf 0-2/hpf   Squamous Epithelial / LPF Few(5-10/hpf) (A) Rare(0-4/hpf)    Bacteria, UA Rare(<10/hpf) (A) None   Yeast, UA Presence of (A) None  Comp Met (CMET)     Status: Abnormal  Collection Time: 04/30/14  4:25 PM  Result Value Ref Range   Sodium 131 (L) 135 - 145 mEq/L   Potassium 4.1 3.5 - 5.1 mEq/L   Chloride 97 96 - 112 mEq/L   CO2 28 19 - 32 mEq/L   Glucose, Bld 278 (H) 70 - 99 mg/dL   BUN 12 6 - 23 mg/dL   Creatinine, Ser 1.0 0.4 - 1.2 mg/dL   Total Bilirubin 0.8 0.2 - 1.2 mg/dL   Alkaline Phosphatase 65 39 - 117 U/L   AST 18 0 - 37 U/L   ALT 10 0 - 35 U/L   Total Protein 7.4 6.0 - 8.3 g/dL   Albumin 3.5 3.5 - 5.2 g/dL   Calcium 9.0 8.4 - 10.5 mg/dL   GFR 59.95 (L) >60.00 mL/min  Urine Microalbumin w/creat. ratio     Status: None   Collection Time: 04/30/14  4:25 PM  Result Value Ref Range   Microalb, Ur 0.4 0.0 - 1.9 mg/dL   Creatinine,U 64.4 mg/dL   Microalb Creat Ratio 0.6 0.0 - 30.0 mg/g    Assessment/Plan: Chronic hepatitis C without hepatic coma Now followed by Hepatology.  Currently on antiviral regimen.  Has scheduled follow-up and upcoming imaging to further assess liver.  Diabetes mellitus, type II, insulin dependent Endorses sugars are at acceptable range.  Will check repeat labs today to include a urine microalbumin/creatinine ratio.  Continue foot care. Continue medications as directed.  Again recommended nutritionist.  Follow-up 3 months.  CKD (chronic kidney disease) Will repeat BMP today to assess.

## 2014-04-30 NOTE — Progress Notes (Signed)
Pre visit review using our clinic review tool, if applicable. No additional management support is needed unless otherwise documented below in the visit note/SLS  

## 2014-05-01 ENCOUNTER — Telehealth: Payer: Self-pay | Admitting: Physician Assistant

## 2014-05-01 LAB — URINALYSIS, ROUTINE W REFLEX MICROSCOPIC
Bilirubin Urine: NEGATIVE
Hgb urine dipstick: NEGATIVE
KETONES UR: NEGATIVE
LEUKOCYTES UA: NEGATIVE
NITRITE: NEGATIVE
PH: 6.5 (ref 5.0–8.0)
SPECIFIC GRAVITY, URINE: 1.01 (ref 1.000–1.030)
Total Protein, Urine: NEGATIVE
Urine Glucose: >=1000 — AB
Urobilinogen, UA: 0.2 (ref 0.0–1.0)

## 2014-05-01 LAB — COMPREHENSIVE METABOLIC PANEL
ALBUMIN: 3.5 g/dL (ref 3.5–5.2)
ALT: 10 U/L (ref 0–35)
AST: 18 U/L (ref 0–37)
Alkaline Phosphatase: 65 U/L (ref 39–117)
BILIRUBIN TOTAL: 0.8 mg/dL (ref 0.2–1.2)
BUN: 12 mg/dL (ref 6–23)
CO2: 28 mEq/L (ref 19–32)
CREATININE: 1 mg/dL (ref 0.4–1.2)
Calcium: 9 mg/dL (ref 8.4–10.5)
Chloride: 97 mEq/L (ref 96–112)
GFR: 59.95 mL/min — ABNORMAL LOW (ref >60.00)
GLUCOSE: 278 mg/dL — AB (ref 70–99)
POTASSIUM: 4.1 meq/L (ref 3.5–5.1)
Sodium: 131 mEq/L — ABNORMAL LOW (ref 135–145)
Total Protein: 7.4 g/dL (ref 6.0–8.3)

## 2014-05-01 LAB — MICROALBUMIN / CREATININE URINE RATIO
CREATININE, U: 64.4 mg/dL
MICROALB UR: 0.4 mg/dL (ref 0.0–1.9)
MICROALB/CREAT RATIO: 0.6 mg/g (ref 0.0–30.0)

## 2014-05-01 LAB — HEMOGLOBIN A1C: Hgb A1c MFr Bld: 7.7 % — ABNORMAL HIGH (ref 4.6–6.5)

## 2014-05-01 MED ORDER — FLUCONAZOLE 150 MG PO TABS: 150.0000 mg | ORAL_TABLET | Freq: Once | ORAL | Status: DC

## 2014-05-01 NOTE — Telephone Encounter (Signed)
Patient informed, understood & agreed/SLS  

## 2014-05-01 NOTE — Telephone Encounter (Signed)
Labs good overall.  A1C at 7.7 so just above goal of 7.5.  Continue current measures but decrease intake of carbohydrates.  Urine shows evidence of yeast.  I have sent in a diflucan.  Take as directed.  Follow-up in 3 months.

## 2014-05-07 ENCOUNTER — Ambulatory Visit
Admission: RE | Admit: 2014-05-07 | Discharge: 2014-05-07 | Disposition: A | Discharge status: Home / Self Care | Payer: Commercial Managed Care - HMO | Source: Ambulatory Visit | Attending: Pain Medicine | Admitting: Pain Medicine

## 2014-05-08 NOTE — Assessment & Plan Note (Signed)
Endorses sugars are at acceptable range.  Will check repeat labs today to include a urine microalbumin/creatinine ratio.  Continue foot care. Continue medications as directed.  Again recommended nutritionist.  Follow-up 3 months.

## 2014-05-08 NOTE — Assessment & Plan Note (Signed)
Will repeat BMP today to assess.

## 2014-05-08 NOTE — Assessment & Plan Note (Signed)
Now followed by Hepatology.  Currently on antiviral regimen.  Has scheduled follow-up and upcoming imaging to further assess liver.

## 2014-05-30 ENCOUNTER — Telehealth: Payer: Self-pay | Admitting: Physician Assistant

## 2014-05-30 MED ORDER — FLUTICASONE-SALMETEROL 500-50 MCG/DOSE IN AEPB: 1.0000 | INHALATION_SPRAY | Freq: Once | RESPIRATORY_TRACT | Status: DC

## 2014-05-30 NOTE — Telephone Encounter (Signed)
Refilled Advair.  Appointment scheduled with Mackie Pai, PA-C tomorrow at 8:45 am for UTI.

## 2014-05-30 NOTE — Telephone Encounter (Signed)
Caller name: Renee Noble, Renee Noble Relationship to patient: self  Can be reached: [phone] Pharmacy:  [facility] [place], [place] - [address] [phone] (Phone)     Reason for call: Pt requesting a refill Fluticasone-Salmeterol (ADVAIR DISKUS) 500-50 MCG/DOSE AEPB and a rx for reoccurring UTI. Advised pt she would need an appointment pt stated she knows its a UTI due to frequent urination and urine being cloudy. Please advise

## 2014-05-31 ENCOUNTER — Ambulatory Visit (INDEPENDENT_AMBULATORY_CARE_PROVIDER_SITE_OTHER): Payer: Commercial Managed Care - HMO | Admitting: Medical

## 2014-05-31 ENCOUNTER — Encounter: Payer: Self-pay | Admitting: Medical

## 2014-05-31 VITALS — BP 138/95 | HR 106 | Temp 98.3°F | Ht 64.0 in | Wt 293.6 lb

## 2014-05-31 DIAGNOSIS — R3915 Urgency of urination: Secondary | ICD-10-CM

## 2014-05-31 DIAGNOSIS — N390 Urinary tract infection, site not specified: Secondary | ICD-10-CM | POA: Insufficient documentation

## 2014-05-31 DIAGNOSIS — N3001 Acute cystitis with hematuria: Secondary | ICD-10-CM

## 2014-05-31 DIAGNOSIS — R82998 Other abnormal findings in urine: Secondary | ICD-10-CM

## 2014-05-31 LAB — POCT URINALYSIS DIPSTICK
BILIRUBIN UA: NEGATIVE
Glucose, UA: 2000
Ketones, UA: NEGATIVE
NITRITE UA: NEGATIVE
SPEC GRAV UA: 1.01
Urobilinogen, UA: 0.2
pH, UA: 6

## 2014-05-31 MED ORDER — CEFTRIAXONE SODIUM 1 G IJ SOLR: 1.0000 g | Freq: Once | INTRAMUSCULAR | Status: DC

## 2014-05-31 MED ORDER — CIPROFLOXACIN HCL 500 MG PO TABS: 500.0000 mg | ORAL_TABLET | Freq: Two times a day (BID) | ORAL | Status: DC

## 2014-05-31 NOTE — Progress Notes (Signed)
Lb

## 2014-05-31 NOTE — Patient Instructions (Addendum)
You appear to have a urinary tract infection. I am prescribing  cipr antibiotic for the probable infection.(But also rocephin 1 gram since you only have one kidney. Would like to make sure infection resolved quickly).  Hydrate well. I am sending out a urine culture. During the interim if your signs and symptoms worsen rather than improving please notify us. We will notify your when the culture results are back.  Follow up in 7 days or as needed.

## 2014-05-31 NOTE — Progress Notes (Signed)
Subjective:    Patient ID: Renee Noble, female    DOB: 04/15/1956, 59 y.o.   MRN: [id number]  HPI   Pt in today reporting urinary symptoms x 1 wk.  Dysuria- yes Frequent urination-yes Hesitancy-yes Suprapubic pressure-yes Fever-yes chills-yes Nausea-no Vomiting-no CVA pain-No. 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 History of UTI-No Gross hematuria-No.  Pt is diabetic. Her blood sugar this am is 97 this year.  Past Medical History  Diagnosis Date  . Diabetes     Type II  . Neuropathy     Diabetes  . COPD (chronic obstructive pulmonary disease)   . Renal cancer     Left Kidney Removed  . Fibromyalgia   . Hepatitis C   . Abdominal mass of other site   . Chronic kidney disease, stage 3     Borderline Stage 2-3  . Depression   . Cervical compression fracture   . Hypercholesteremia   . Syncope   . Osteoarthritis   . Hypertension   . Hypothyroidism   . Chronic lower limb pain   . Morbid obesity   . CTS (carpal tunnel syndrome)   . RLS (restless legs syndrome)   . Venous stasis   . OSA (obstructive sleep apnea)     History   Social History  . Marital Status: Single    Spouse Name: N/A    Number of Children: N/A  . Years of Education: N/A   Occupational History  . Not on file.   Social History Main Topics  . Smoking status: Former Smoker -- 2.50 packs/day for 45 years    Types: Cigarettes    Quit date: 09/25/2013  . Smokeless tobacco: Never Used  . Alcohol Use: Not on file  . Drug Use: Not on file  . Sexual Activity: Not on file   Other Topics Concern  . Not on file   Social History Narrative    Past Surgical History  Procedure Laterality Date  . Nephrectomy      Left  . Umbilical hernia repair    . Abdominal hernia repair      x2  . Incisional hernia repair    . Abdominal hysterectomy    . Bladder suspension    . Vagina surgery    . Cholecystectomy    . Wisdom tooth extraction    . Tooth extraction    . Tonsillectomy    . Knee  arthroscopy      Bilateral  . Cyst excision      Head    Family History  Problem Relation Age of Onset  . Heart attack Mother 81    Deceased  . Heart disease Mother   . Emphysema Mother   . COPD Father 50    Deceased  . Emphysema Father   . Alcoholism Father   . Alcoholism Mother   . Esophageal varices Father   . Alcoholism Paternal Grandfather   . Diabetes Maternal Grandmother   . Heart disease Maternal Grandmother   . Lung cancer Maternal Grandfather   . Emphysema Maternal Grandfather   . Brain cancer Maternal Aunt   . Diabetes Sister   . Heart defect Sister   . Obesity Son   . Breast cancer Maternal Aunt     Allergies  Allergen Reactions  . Gabapentin Anaphylaxis  . Ketorolac Tromethamine Hives  . Lisinopril Cough    Current Outpatient Prescriptions on File Prior to Visit  Medication Sig Dispense Refill  . albuterol (PROVENTIL HFA;VENTOLIN HFA)  108 (90 BASE) MCG/ACT inhaler Inhale 1-2 puffs into the lungs every 6 (six) hours as needed for wheezing or shortness of breath. 3.7 g 0  . Bisacodyl (LAXATIVE PO) Take by mouth as needed.    . clotrimazole-betamethasone (LOTRISONE) cream Apply 1 application topically 2 (two) times daily. 30 g 0  . Fluticasone-Salmeterol (ADVAIR DISKUS) 500-50 MCG/DOSE AEPB Inhale 1 puff into the lungs once. 60 each 0  . furosemide (LASIX) 20 MG tablet Take 1 tablet (20 mg total) by mouth daily as needed. 90 tablet 1  . insulin glargine (LANTUS) 100 UNIT/ML injection Inject 0.3 mLs (30 Units total) into the skin daily. 10 mL 3  . insulin lispro (HUMALOG) 100 UNIT/ML injection Inject 0.15 mLs (15 Units total) into the skin 3 (three) times daily before meals. 10 mL 3  . Insulin Syringe-Needle U-100 30G X 1/2" 1 ML MISC Use 4 times daily as directed. 100 each 3  . levothyroxine (SYNTHROID, LEVOTHROID) 75 MCG tablet Take 1 tablet (75 mcg total) by mouth daily before breakfast. 30 tablet 0  . nortriptyline (PAMELOR) 75 MG capsule Take 1 capsule  (75 mg total) by mouth at bedtime. 30 capsule 0  . Oxycodone HCl 10 MG TABS Take 10 mg by mouth 4 (four) times daily as needed.     . polycarbophil (FIBERCON) 625 MG tablet Take 625 mg by mouth daily.    . pravastatin (PRAVACHOL) 20 MG tablet Take 1 tablet (20 mg total) by mouth daily. 90 tablet 1  . ribavirin (REBETOL) 200 MG capsule Take 200 mg by mouth 2 (two) times daily.    . sitaGLIPtin (JANUVIA) 50 MG tablet Take 1 tablet (50 mg total) by mouth daily. 90 tablet 1  . Sofosbuvir (SOVALDI) 400 MG TABS Take 400 mg by mouth daily.    [name] Kitchen tiotropium (SPIRIVA) 18 MCG inhalation capsule Place 1 capsule (18 mcg total) into inhaler and inhale daily. 30 capsule 0  . zolpidem (AMBIEN) 10 MG tablet Take 1 tablet (10 mg total) by mouth at bedtime as needed for sleep. 30 tablet 0  . fluconazole (DIFLUCAN) 150 MG tablet Take 1 tablet (150 mg total) by mouth once. (Patient not taking: Reported on 05/31/2014) 1 tablet 0  . nystatin (MYCOSTATIN/NYSTOP) 100000 UNIT/GM POWD Apply topically.     No current facility-administered medications on file prior to visit.    BP 138/95 mmHg  Pulse 106  Temp(Src) 98.3 F (36.8 C) (Oral)  Ht 5\' 4"  (1.626 m)  Wt 293 lb 9.6 oz (133.176 kg)  BMI 50.37 kg/m2  SpO2 96%      Review of Systems  Constitutional: Negative for fever, chills and fatigue.  Cardiovascular: Negative for chest pain and palpitations.  Gastrointestinal: Negative for nausea, abdominal pain, diarrhea, constipation and blood in stool.  Genitourinary: Positive for dysuria, urgency and frequency. Negative for hematuria, flank pain, decreased urine volume, genital sores, vaginal pain and pelvic pain.  Musculoskeletal: Negative for back pain.  Neurological: Negative for headaches.  Hematological: Negative for adenopathy. Does not bruise/bleed easily.       Objective:   Physical Exam   General Appearance- Not in acute distress.  HEENT Eyes- Scleraeral/Conjuntiva-bilat- Not Yellow. Mouth &  Throat- Normal.  Chest and Lung Exam Auscultation: Breath sounds:-Normal. Adventitious sounds:- No Adventitious sounds.  Cardiovascular Auscultation:Rythm - Regular. Heart Sounds -Normal heart sounds.  Abdomen Inspection:-Inspection Normal.  Palpation/Perucssion: Palpation and Percussion of the abdomen reveal- faint  Tender suprapubic, No Rebound tenderness, No rigidity(Guarding) and No Palpable abdominal masses. +  bs.  Liver:-Normal.  Spleen:- Normal.   Back- no cva tenderness.         Assessment & Plan:

## 2014-05-31 NOTE — Assessment & Plan Note (Signed)
Rocephin 1 gram im and cipro rx. Pending urine culture.

## 2014-06-03 LAB — URINE CULTURE

## 2014-06-11 NOTE — Telephone Encounter (Signed)
error 

## 2014-07-10 ENCOUNTER — Other Ambulatory Visit: Payer: Self-pay | Admitting: Physician Assistant

## 2014-07-10 DIAGNOSIS — J449 Chronic obstructive pulmonary disease, unspecified: Secondary | ICD-10-CM

## 2014-07-10 MED ORDER — LEVOTHYROXINE SODIUM 75 MCG PO TABS: 75.0000 ug | ORAL_TABLET | Freq: Every day | ORAL | Status: DC

## 2014-07-10 MED ORDER — TIOTROPIUM BROMIDE MONOHYDRATE 18 MCG IN CAPS
18.0000 ug | ORAL_CAPSULE | Freq: Every day | RESPIRATORY_TRACT | Status: DC
Start: 2014-07-10 — End: 2015-06-03

## 2014-07-10 MED ORDER — NORTRIPTYLINE HCL 75 MG PO CAPS: 75.0000 mg | ORAL_CAPSULE | Freq: Every day | ORAL | Status: DC

## 2014-07-10 MED ORDER — ALBUTEROL SULFATE HFA 108 (90 BASE) MCG/ACT IN AERS: 1.0000 | INHALATION_SPRAY | Freq: Four times a day (QID) | RESPIRATORY_TRACT | Status: DC | PRN

## 2014-07-30 ENCOUNTER — Encounter: Payer: Self-pay | Admitting: Physician Assistant

## 2014-07-30 ENCOUNTER — Ambulatory Visit (INDEPENDENT_AMBULATORY_CARE_PROVIDER_SITE_OTHER): Payer: Commercial Managed Care - HMO | Admitting: Physician Assistant

## 2014-07-30 ENCOUNTER — Ambulatory Visit: Payer: Commercial Managed Care - HMO | Admitting: Physician Assistant

## 2014-07-30 VITALS — BP 128/86 | HR 99 | Temp 98.4°F | Resp 18 | Ht 64.0 in | Wt 310.5 lb

## 2014-07-30 DIAGNOSIS — Z794 Long term (current) use of insulin: Secondary | ICD-10-CM

## 2014-07-30 DIAGNOSIS — J449 Chronic obstructive pulmonary disease, unspecified: Secondary | ICD-10-CM

## 2014-07-30 DIAGNOSIS — K469 Unspecified abdominal hernia without obstruction or gangrene: Secondary | ICD-10-CM

## 2014-07-30 DIAGNOSIS — E119 Type 2 diabetes mellitus without complications: Secondary | ICD-10-CM

## 2014-07-30 MED ORDER — ALBUTEROL SULFATE HFA 108 (90 BASE) MCG/ACT IN AERS: 1.0000 | INHALATION_SPRAY | Freq: Four times a day (QID) | RESPIRATORY_TRACT | Status: DC | PRN

## 2014-07-30 NOTE — Assessment & Plan Note (Signed)
Medications refilled.  Condition stable.

## 2014-07-30 NOTE — Patient Instructions (Signed)
Go to the lab for blood work.  I will call you with your results.  Increase your Lantus by 1-2 units each morning. Eat more consistently but continue to limit carbs.  Follow-up will be based on results.

## 2014-07-30 NOTE — Progress Notes (Signed)
Pre visit review using our clinic review tool, if applicable. No additional management support is needed unless otherwise documented below in the visit note/SLS  

## 2014-07-30 NOTE — Assessment & Plan Note (Signed)
Will check BMP and A1C. Increase Lantus 2 units in the am.  Continue diet.  Continue other medications as directed.  Follow-up Q3 months.

## 2014-07-30 NOTE — Progress Notes (Signed)
Patient presents to clinic today c/o worsening of her abdominal hernia over the past few weeks.  Endorses pain and increased swelling.  Denies erythema, warmth.  Denies fever, chills.  Still passing gas and with good bowel output.  Last bowel movement was this morning.  Patient endorses taking Lantus 30 units QAM and Humalog 15 units with meals.  Endorses fasting glucose 120-140.  Is trying to do better with diet.  Is due for repeat A1C.  Past Medical History  Diagnosis Date  . Diabetes     Type II  . Neuropathy     Diabetes  . COPD (chronic obstructive pulmonary disease)   . Renal cancer     Left Kidney Removed  . Fibromyalgia   . Hepatitis C   . Abdominal mass of other site   . Chronic kidney disease, stage 3     Borderline Stage 2-3  . Depression   . Cervical compression fracture   . Hypercholesteremia   . Syncope   . Osteoarthritis   . Hypertension   . Hypothyroidism   . Chronic lower limb pain   . Morbid obesity   . CTS (carpal tunnel syndrome)   . RLS (restless legs syndrome)   . Venous stasis   . OSA (obstructive sleep apnea)     Current Outpatient Prescriptions on File Prior to Visit  Medication Sig Dispense Refill  . Bisacodyl (LAXATIVE PO) Take by mouth as needed.    . clotrimazole-betamethasone (LOTRISONE) cream Apply 1 application topically 2 (two) times daily. 30 g 0  . Fluticasone-Salmeterol (ADVAIR DISKUS) 500-50 MCG/DOSE AEPB Inhale 1 puff into the lungs once. 60 each 0  . furosemide (LASIX) 20 MG tablet Take 1 tablet (20 mg total) by mouth daily as needed. 90 tablet 1  . insulin glargine (LANTUS) 100 UNIT/ML injection Inject 0.3 mLs (30 Units total) into the skin daily. 10 mL 3  . insulin lispro (HUMALOG) 100 UNIT/ML injection Inject 0.15 mLs (15 Units total) into the skin 3 (three) times daily before meals. 10 mL 3  . Insulin Syringe-Needle U-100 30G X 1/2" 1 ML MISC Use 4 times daily as directed. 100 each 3  . levothyroxine (SYNTHROID, LEVOTHROID) 75  MCG tablet Take 1 tablet (75 mcg total) by mouth daily before breakfast. 30 tablet 0  . nortriptyline (PAMELOR) 75 MG capsule Take 1 capsule (75 mg total) by mouth at bedtime. 30 capsule 0  . nystatin (MYCOSTATIN/NYSTOP) 100000 UNIT/GM POWD Apply topically.    . Oxycodone HCl 10 MG TABS Take 10 mg by mouth 4 (four) times daily as needed.     . polycarbophil (FIBERCON) 625 MG tablet Take 625 mg by mouth daily.    . pravastatin (PRAVACHOL) 20 MG tablet Take 1 tablet (20 mg total) by mouth daily. 90 tablet 1  . ribavirin (REBETOL) 200 MG capsule Take 200 mg by mouth 2 (two) times daily.    . sitaGLIPtin (JANUVIA) 50 MG tablet Take 1 tablet (50 mg total) by mouth daily. 90 tablet 1  . Sofosbuvir (SOVALDI) 400 MG TABS Take 400 mg by mouth daily.    [name] Kitchen tiotropium (SPIRIVA) 18 MCG inhalation capsule Place 1 capsule (18 mcg total) into inhaler and inhale daily. 30 capsule 6  . zolpidem (AMBIEN) 10 MG tablet Take 1 tablet (10 mg total) by mouth at bedtime as needed for sleep. 30 tablet 0   No current facility-administered medications on file prior to visit.    Allergies  Allergen Reactions  .  Gabapentin Anaphylaxis  . Ketorolac Tromethamine Hives  . Lisinopril Cough    Family History  Problem Relation Age of Onset  . Heart attack Mother 50    Deceased  . Heart disease Mother   . Emphysema Mother   . COPD Father 28    Deceased  . Emphysema Father   . Alcoholism Father   . Alcoholism Mother   . Esophageal varices Father   . Alcoholism Paternal Grandfather   . Diabetes Maternal Grandmother   . Heart disease Maternal Grandmother   . Lung cancer Maternal Grandfather   . Emphysema Maternal Grandfather   . Brain cancer Maternal Aunt   . Diabetes Sister   . Heart defect Sister   . Obesity Son   . Breast cancer Maternal Aunt     History   Social History  . Marital Status: Single    Spouse Name: N/A  . Number of Children: N/A  . Years of Education: N/A   Social History Main Topics    . Smoking status: Former Smoker -- 2.50 packs/day for 45 years    Types: Cigarettes    Quit date: 09/25/2013  . Smokeless tobacco: Never Used  . Alcohol Use: Not on file  . Drug Use: Not on file  . Sexual Activity: Not on file   Other Topics Concern  . None   Social History Narrative    Review of Systems - See HPI.  All other ROS are negative.  BP 128/86 mmHg  Pulse 99  Temp(Src) 98.4 F (36.9 C) (Oral)  Resp 18  Ht 5\' 4"  (1.626 m)  Wt 310 lb 8 oz (140.842 kg)  BMI 53.27 kg/m2  SpO2 94%  Physical Exam  Constitutional: She is oriented to person, place, and time and well-developed, well-nourished, and in no distress.  HENT:  Head: Normocephalic and atraumatic.  Eyes: Conjunctivae are normal.  Neck: Neck supple.  Cardiovascular: Normal rate, regular rhythm, normal heart sounds and intact distal pulses.   Pulmonary/Chest: Effort normal and breath sounds normal. No respiratory distress. She has no wheezes. She has no rales. She exhibits no tenderness.  Abdominal: Soft. Bowel sounds are normal.    Neurological: She is alert and oriented to person, place, and time.  Skin: Skin is warm and dry. No rash noted.  Psychiatric: Affect normal.  Vitals reviewed.   Recent Results (from the past 2160 hour(s))  POCT Urinalysis Dipstick     Status: Abnormal   Collection Time: 05/31/14  9:46 AM  Result Value Ref Range   Color, UA Yellow    Clarity, UA Clear    Glucose, UA 2000    Bilirubin, UA Neg    Ketones, UA Neg    Spec Grav, UA 1.010    Blood, UA Moderate    pH, UA 6.0    Protein, UA Trace    Urobilinogen, UA 0.2    Nitrite, UA Neg    Leukocytes, UA Trace   Urine Culture     Status: None   Collection Time: 05/31/14 12:33 PM  Result Value Ref Range   Culture ESCHERICHIA COLI    Colony Count >=100,000 COLONIES/ML    Organism ID, Bacteria ESCHERICHIA COLI       Susceptibility   Escherichia coli -  (no method available)    AMPICILLIN <=2 Sensitive      AMOX/CLAVULANIC <=2 Sensitive     AMPICILLIN/SULBACTAM <=2 Sensitive     PIP/TAZO <=4 Sensitive     IMIPENEM <=0.25 Sensitive  CEFAZOLIN <=4 Sensitive     CEFTRIAXONE <=1 Sensitive     CEFTAZIDIME <=1 Sensitive     CEFEPIME <=1 Sensitive     GENTAMICIN <=1 Sensitive     TOBRAMYCIN <=1 Sensitive     CIPROFLOXACIN <=0.25 Sensitive     LEVOFLOXACIN <=0.12 Sensitive     NITROFURANTOIN <=16 Sensitive     TRIMETH/SULFA <=20 Sensitive     Assessment/Plan: Diabetes mellitus, type II, insulin dependent Will check BMP and A1C. Increase Lantus 2 units in the am.  Continue diet.  Continue other medications as directed.  Follow-up Q3 months.   COPD GOLDIII with min reversibility  Medications refilled.  Condition stable.   Hernia of abdominal cavity Deteriorating.  Referral to General Surgery placed.  Alarm signs/symptoms discussed with patient.

## 2014-07-30 NOTE — Assessment & Plan Note (Signed)
Deteriorating.  Referral to General Surgery placed.  Alarm signs/symptoms discussed with patient.

## 2014-07-31 LAB — BASIC METABOLIC PANEL
BUN: 14 mg/dL (ref 6–23)
CALCIUM: 8.9 mg/dL (ref 8.4–10.5)
CHLORIDE: 99 meq/L (ref 96–112)
CO2: 31 meq/L (ref 19–32)
CREATININE: 1.1 mg/dL (ref 0.40–1.20)
GFR: 53.66 mL/min — ABNORMAL LOW (ref >60.00)
Glucose, Bld: 189 mg/dL — ABNORMAL HIGH (ref 70–99)
Potassium: 4.2 mEq/L (ref 3.5–5.1)
Sodium: 135 mEq/L (ref 135–145)

## 2014-07-31 LAB — HEMOGLOBIN A1C: Hgb A1c MFr Bld: 8.8 % — ABNORMAL HIGH (ref 4.6–6.5)

## 2014-08-15 ENCOUNTER — Telehealth: Payer: Self-pay | Admitting: *Deleted

## 2014-08-15 DIAGNOSIS — M5136 Other intervertebral disc degeneration, lumbar region: Secondary | ICD-10-CM

## 2014-08-15 DIAGNOSIS — G894 Chronic pain syndrome: Secondary | ICD-10-CM

## 2014-08-15 DIAGNOSIS — E038 Other specified hypothyroidism: Secondary | ICD-10-CM

## 2014-08-15 MED ORDER — FLUTICASONE-SALMETEROL 500-50 MCG/DOSE IN AEPB: 1.0000 | INHALATION_SPRAY | Freq: Once | RESPIRATORY_TRACT | Status: DC

## 2014-08-16 ENCOUNTER — Other Ambulatory Visit: Payer: Self-pay | Admitting: Physician Assistant

## 2014-08-16 MED ORDER — NORTRIPTYLINE HCL 75 MG PO CAPS: 75.0000 mg | ORAL_CAPSULE | Freq: Every day | ORAL | Status: DC

## 2014-08-16 NOTE — Telephone Encounter (Signed)
Last OV with PCP 07/30/14 Last refill #30 with 0 refills on 07/10/14. No Future appointment scheduled at this time. Advise on refill please

## 2014-08-16 NOTE — Telephone Encounter (Signed)
Called and spoke with the pt and informed her that [name] would like for her to come in for a TSH blood draw.  Pt verbalized understanding and agreed.  Lab appt scheduled for (Wed 08/21/14 @ 1:30pm).  Will refill the Levothyroxine after getting the TSH results.  Rx for the Advair was sent to the pharmacy by e-script.//AB/CMA

## 2014-08-16 NOTE — Telephone Encounter (Signed)
Referral to pain clinic placed. TSH ordered -- return ASAP to lab for this test.

## 2014-08-20 ENCOUNTER — Other Ambulatory Visit: Payer: Self-pay | Admitting: *Deleted

## 2014-08-20 ENCOUNTER — Other Ambulatory Visit (INDEPENDENT_AMBULATORY_CARE_PROVIDER_SITE_OTHER): Payer: Self-pay | Admitting: General Surgery

## 2014-08-20 DIAGNOSIS — K432 Incisional hernia without obstruction or gangrene: Secondary | ICD-10-CM

## 2014-08-20 NOTE — Addendum Note (Signed)
Addended by: Adin Hector on: 08/20/2014 05:55 PM   Modules accepted: Orders

## 2014-08-21 ENCOUNTER — Other Ambulatory Visit: Payer: Self-pay | Admitting: *Deleted

## 2014-08-21 ENCOUNTER — Other Ambulatory Visit (INDEPENDENT_AMBULATORY_CARE_PROVIDER_SITE_OTHER): Payer: Commercial Managed Care - HMO

## 2014-08-21 DIAGNOSIS — K432 Incisional hernia without obstruction or gangrene: Secondary | ICD-10-CM

## 2014-08-21 DIAGNOSIS — E038 Other specified hypothyroidism: Secondary | ICD-10-CM

## 2014-08-21 LAB — TSH: TSH: 3.73 u[IU]/mL (ref 0.35–4.50)

## 2014-08-22 ENCOUNTER — Other Ambulatory Visit: Payer: Self-pay | Admitting: Physician Assistant

## 2014-08-22 MED ORDER — LEVOTHYROXINE SODIUM 75 MCG PO TABS: 75.0000 ug | ORAL_TABLET | Freq: Every day | ORAL | Status: DC

## 2014-08-23 ENCOUNTER — Ambulatory Visit
Admission: RE | Admit: 2014-08-23 | Discharge: 2014-08-23 | Disposition: A | Discharge status: Home / Self Care | Payer: Commercial Managed Care - HMO | Source: Ambulatory Visit | Attending: General Surgery | Admitting: General Surgery

## 2014-08-23 MED ORDER — IOPAMIDOL (ISOVUE-300) INJECTION 61%
125.0000 mL | Freq: Once | INTRAVENOUS | Status: AC | PRN
  Administered 2014-08-23: 125 mL via INTRAVENOUS

## 2014-09-02 ENCOUNTER — Encounter: Payer: Self-pay | Admitting: *Deleted

## 2014-09-02 ENCOUNTER — Ambulatory Visit (INDEPENDENT_AMBULATORY_CARE_PROVIDER_SITE_OTHER): Payer: Commercial Managed Care - HMO | Admitting: Physician Assistant

## 2014-09-02 ENCOUNTER — Encounter: Payer: Self-pay | Admitting: Physician Assistant

## 2014-09-02 VITALS — BP 142/79 | HR 107 | Temp 98.3°F | Resp 18 | Ht 64.0 in | Wt 291.1 lb

## 2014-09-02 DIAGNOSIS — E119 Type 2 diabetes mellitus without complications: Secondary | ICD-10-CM | POA: Diagnosis not present

## 2014-09-02 DIAGNOSIS — G8929 Other chronic pain: Secondary | ICD-10-CM

## 2014-09-02 DIAGNOSIS — Z4659 Encounter for fitting and adjustment of other gastrointestinal appliance and device: Secondary | ICD-10-CM | POA: Insufficient documentation

## 2014-09-02 DIAGNOSIS — J449 Chronic obstructive pulmonary disease, unspecified: Secondary | ICD-10-CM | POA: Diagnosis not present

## 2014-09-02 DIAGNOSIS — Z7189 Other specified counseling: Secondary | ICD-10-CM

## 2014-09-02 DIAGNOSIS — Z794 Long term (current) use of insulin: Secondary | ICD-10-CM | POA: Diagnosis not present

## 2014-09-02 MED ORDER — ZOLPIDEM TARTRATE 10 MG PO TABS: 10.0000 mg | ORAL_TABLET | Freq: Every evening | ORAL | Status: DC | PRN

## 2014-09-02 MED ORDER — OXYCODONE HCL 15 MG PO TABS: 15.0000 mg | ORAL_TABLET | Freq: Four times a day (QID) | ORAL | Status: DC | PRN

## 2014-09-02 MED ORDER — FLUTICASONE-SALMETEROL 500-50 MCG/DOSE IN AEPB: 1.0000 | INHALATION_SPRAY | Freq: Once | RESPIRATORY_TRACT | Status: DC

## 2014-09-02 MED ORDER — INSULIN LISPRO 100 UNIT/ML ~~LOC~~ SOLN: 15.0000 [IU] | Freq: Three times a day (TID) | SUBCUTANEOUS | Status: DC

## 2014-09-02 MED ORDER — ALBUTEROL SULFATE HFA 108 (90 BASE) MCG/ACT IN AERS: 1.0000 | INHALATION_SPRAY | Freq: Four times a day (QID) | RESPIRATORY_TRACT | Status: DC | PRN

## 2014-09-02 NOTE — Assessment & Plan Note (Signed)
Patient in queue for Pain Management.  I will take over medication management until her appointment.  CSC signed. Medications refilled. Follow-up in 1 month.  Will obtain UDS at next visit.

## 2014-09-02 NOTE — Progress Notes (Signed)
Patient presents to clinic today to discuss medication management for chronic pain.  Was just released from her current Pain Clinic as they no longer take her insurance.  Referral to new specialist in the works but she has been told it may be 1-2 months before she can get in.  Is requesting we take over management in the interm time period.  Past Medical History  Diagnosis Date  . Diabetes     Type II  . Neuropathy     Diabetes  . COPD (chronic obstructive pulmonary disease)   . Renal cancer     Left Kidney Removed  . Fibromyalgia   . Hepatitis C   . Abdominal mass of other site   . Chronic kidney disease, stage 3     Borderline Stage 2-3  . Depression   . Cervical compression fracture   . Hypercholesteremia   . Syncope   . Osteoarthritis   . Hypertension   . Hypothyroidism   . Chronic lower limb pain   . Morbid obesity   . CTS (carpal tunnel syndrome)   . RLS (restless legs syndrome)   . Venous stasis   . OSA (obstructive sleep apnea)     Current Outpatient Prescriptions on File Prior to Visit  Medication Sig Dispense Refill  . Bisacodyl (LAXATIVE PO) Take by mouth as needed.    . clotrimazole-betamethasone (LOTRISONE) cream Apply 1 application topically 2 (two) times daily. 30 g 0  . furosemide (LASIX) 20 MG tablet Take 1 tablet (20 mg total) by mouth daily as needed. 90 tablet 1  . insulin glargine (LANTUS) 100 UNIT/ML injection Inject 0.3 mLs (30 Units total) into the skin daily. 10 mL 3  . Insulin Syringe-Needle U-100 30G X 1/2" 1 ML MISC Use 4 times daily as directed. 100 each 3  . levothyroxine (SYNTHROID, LEVOTHROID) 75 MCG tablet Take 1 tablet (75 mcg total) by mouth daily before breakfast. 90 tablet 1  . nortriptyline (PAMELOR) 75 MG capsule Take 1 capsule (75 mg total) by mouth at bedtime. 30 capsule 0  . nystatin (MYCOSTATIN/NYSTOP) 100000 UNIT/GM POWD Apply topically.    . polycarbophil (FIBERCON) 625 MG tablet Take 625 mg by mouth daily.    . pravastatin  (PRAVACHOL) 20 MG tablet Take 1 tablet (20 mg total) by mouth daily. 90 tablet 1  . sitaGLIPtin (JANUVIA) 50 MG tablet Take 1 tablet (50 mg total) by mouth daily. 90 tablet 1  . tiotropium (SPIRIVA) 18 MCG inhalation capsule Place 1 capsule (18 mcg total) into inhaler and inhale daily. 30 capsule 6   No current facility-administered medications on file prior to visit.    Allergies  Allergen Reactions  . Gabapentin Anaphylaxis  . Ketorolac Tromethamine Hives  . Lisinopril Cough    Family History  Problem Relation Age of Onset  . Heart attack Mother 29    Deceased  . Heart disease Mother   . Emphysema Mother   . COPD Father 44    Deceased  . Emphysema Father   . Alcoholism Father   . Alcoholism Mother   . Esophageal varices Father   . Alcoholism Paternal Grandfather   . Diabetes Maternal Grandmother   . Heart disease Maternal Grandmother   . Lung cancer Maternal Grandfather   . Emphysema Maternal Grandfather   . Brain cancer Maternal Aunt   . Diabetes Sister   . Heart defect Sister   . Obesity Son   . Breast cancer Maternal Aunt  History   Social History  . Marital Status: Single    Spouse Name: N/A  . Number of Children: N/A  . Years of Education: N/A   Social History Main Topics  . Smoking status: Former Smoker -- 2.50 packs/day for 45 years    Types: Cigarettes    Quit date: 09/25/2013  . Smokeless tobacco: Never Used  . Alcohol Use: Not on file  . Drug Use: Not on file  . Sexual Activity: Not on file   Other Topics Concern  . None   Social History Narrative   Review of Systems - See HPI.  All other ROS are negative.  BP 142/79 mmHg  Pulse 107  Temp(Src) 98.3 F (36.8 C) (Oral)  Resp 18  Ht 5\' 4"  (1.626 m)  Wt 291 lb 2 oz (132.053 kg)  BMI 49.95 kg/m2  SpO2 97%  Physical Exam  Constitutional: She is oriented to person, place, and time and well-developed, well-nourished, and in no distress.  HENT:  Head: Normocephalic and atraumatic.    Cardiovascular: Normal rate.   Pulmonary/Chest: Effort normal.  Neurological: She is alert and oriented to person, place, and time.  Psychiatric: Affect normal.  Vitals reviewed.   Recent Results (from the past 2160 hour(s))  Basic Metabolic Panel (BMET)     Status: Abnormal   Collection Time: 07/30/14  4:02 PM  Result Value Ref Range   Sodium 135 135 - 145 mEq/L   Potassium 4.2 3.5 - 5.1 mEq/L   Chloride 99 96 - 112 mEq/L   CO2 31 19 - 32 mEq/L   Glucose, Bld 189 (H) 70 - 99 mg/dL   BUN 14 6 - 23 mg/dL   Creatinine, Ser 1.10 0.40 - 1.20 mg/dL   Calcium 8.9 8.4 - 10.5 mg/dL   GFR 53.66 (L) >60.00 mL/min  Hemoglobin A1c     Status: Abnormal   Collection Time: 07/30/14  4:02 PM  Result Value Ref Range   Hgb A1c MFr Bld 8.8 (H) 4.6 - 6.5 %    Comment: Glycemic Control Guidelines for People with Diabetes:Non Diabetic:  <6%Goal of Therapy: <7%Additional Action Suggested:  >8%   TSH     Status: None   Collection Time: 08/21/14  1:19 PM  Result Value Ref Range   TSH 3.73 0.35 - 4.50 uIU/mL    Assessment/Plan: Encounter for chronic pain management Patient in queue for Pain Management.  I will take over medication management until her appointment.  CSC signed. Medications refilled. Follow-up in 1 month.  Will obtain UDS at next visit.

## 2014-09-02 NOTE — Progress Notes (Signed)
Pre visit review using our clinic review tool, if applicable. No additional management support is needed unless otherwise documented below in the visit note/SLS  

## 2014-09-02 NOTE — Patient Instructions (Signed)
Please continue medications as directed. I will take over your pain medications until we get you in with Dr. Gerarda Fraction with pain management. Let me know if you have any difficulty getting the Ventolin covered by insurance.  Chronic Pain Chronic pain can be defined as pain that is off and on and lasts for 3-6 months or longer. Many things cause chronic pain, which can make it difficult to make a diagnosis. There are many treatment options available for chronic pain. However, finding a treatment that works well for you may require trying various approaches until the right one is found. Many people benefit from a combination of two or more types of treatment to control their pain. SYMPTOMS  Chronic pain can occur anywhere in the body and can range from mild to very severe. Some types of chronic pain include:  Headache.  Low back pain.  Cancer pain.  Arthritis pain.  Neurogenic pain. This is pain resulting from damage to nerves. People with chronic pain may also have other symptoms such as:  Depression.  Anger.  Insomnia.  Anxiety. DIAGNOSIS  Your health care provider will help diagnose your condition over time. In many cases, the initial focus will be on excluding possible conditions that could be causing the pain. Depending on your symptoms, your health care provider may order tests to diagnose your condition. Some of these tests may include:   Blood tests.   CT scan.   MRI.   X-rays.   Ultrasounds.   Nerve conduction studies.  You may need to see a specialist.  TREATMENT  Finding treatment that works well may take time. You may be referred to a pain specialist. He or she may prescribe medicine or therapies, such as:   Mindful meditation or yoga.  Shots (injections) of numbing or pain-relieving medicines into the spine or area of pain.  Local electrical stimulation.  Acupuncture.   Massage therapy.   Aroma, color, light, or sound therapy.    Biofeedback.   Working with a physical therapist to keep from getting stiff.   Regular, gentle exercise.   Cognitive or behavioral therapy.   Group support.  Sometimes, surgery may be recommended.  HOME CARE INSTRUCTIONS   Take all medicines as directed by your health care provider.   Lessen stress in your life by relaxing and doing things such as listening to calming music.   Exercise or be active as directed by your health care provider.   Eat a healthy diet and include things such as vegetables, fruits, fish, and lean meats in your diet.   Keep all follow-up appointments with your health care provider.   Attend a support group with others suffering from chronic pain. SEEK MEDICAL CARE IF:   Your pain gets worse.   You develop a new pain that was not there before.   You cannot tolerate medicines given to you by your health care provider.   You have new symptoms since your last visit with your health care provider.  SEEK IMMEDIATE MEDICAL CARE IF:   You feel weak.   You have decreased sensation or numbness.   You lose control of bowel or bladder function.   Your pain suddenly gets much worse.   You develop shaking.  You develop chills.  You develop confusion.  You develop chest pain.  You develop shortness of breath.  MAKE SURE YOU:  Understand these instructions.  Will watch your condition.  Will get help right away if you are not doing well or  get worse. Document Released: 01/23/2002 Document Revised: 01/03/2013 Document Reviewed: 10/27/2012 [facility] Patient Information 2015 [place], [place]. This information is not intended to replace advice given to you by your health care provider. Make sure you discuss any questions you have with your health care provider.

## 2014-10-04 ENCOUNTER — Telehealth: Payer: Self-pay | Admitting: Physician Assistant

## 2014-10-04 MED ORDER — OXYCODONE HCL 15 MG PO TABS: 15.0000 mg | ORAL_TABLET | Freq: Four times a day (QID) | ORAL | Status: DC | PRN

## 2014-10-04 NOTE — Telephone Encounter (Signed)
Refill Oxycodone granted.  Rx printed and at front desk for signature.  Ambien should have refills on it. She needs to contact her pharmacy.

## 2014-10-04 NOTE — Telephone Encounter (Signed)
Caller name: [place] Relation to pt: self Call back number: [phone] Pharmacy:  Reason for call:   Requesting ambien and oxycodone rx

## 2014-10-04 NOTE — Telephone Encounter (Signed)
Last OV 09/02/14 ambien last filled 09/02/14 #30 with 5 (should have refills) Oxycodone last filled 09/02/14 #120 with 0  UDS given on 09/02/14 and CSC signed.

## 2014-10-04 NOTE — Telephone Encounter (Signed)
Informed patient of this.  °

## 2014-10-07 ENCOUNTER — Other Ambulatory Visit: Payer: Self-pay | Admitting: Physician Assistant

## 2014-10-07 DIAGNOSIS — E119 Type 2 diabetes mellitus without complications: Secondary | ICD-10-CM

## 2014-10-07 DIAGNOSIS — Z1231 Encounter for screening mammogram for malignant neoplasm of breast: Secondary | ICD-10-CM

## 2014-10-07 DIAGNOSIS — Z794 Long term (current) use of insulin: Principal | ICD-10-CM

## 2014-10-07 MED ORDER — SITAGLIPTIN PHOSPHATE 50 MG PO TABS: 50.0000 mg | ORAL_TABLET | Freq: Every day | ORAL | Status: DC

## 2014-10-11 ENCOUNTER — Other Ambulatory Visit: Payer: Self-pay | Admitting: Physician Assistant

## 2014-10-11 MED ORDER — NORTRIPTYLINE HCL 75 MG PO CAPS: 75.0000 mg | ORAL_CAPSULE | Freq: Every day | ORAL | Status: DC

## 2014-10-17 NOTE — Addendum Note (Signed)
Addended by: Peggyann Shoals on: 10/17/2014 03:01 PM   Modules accepted: Orders

## 2014-10-22 ENCOUNTER — Encounter: Payer: Self-pay | Admitting: Physical Medicine & Rehabilitation

## 2014-11-06 ENCOUNTER — Telehealth: Payer: Self-pay | Admitting: Physician Assistant

## 2014-11-06 MED ORDER — OXYCODONE HCL 15 MG PO TABS: 15.0000 mg | ORAL_TABLET | Freq: Four times a day (QID) | ORAL | Status: DC | PRN

## 2014-11-06 NOTE — Telephone Encounter (Signed)
Informed patient of this.  °

## 2014-11-06 NOTE — Telephone Encounter (Signed)
Caller name: [place] Relation to pt: Call back number: [phone] Pharmacy:  Reason for call:   Requesting oxycodone rx

## 2014-11-06 NOTE — Telephone Encounter (Signed)
Rx ready for pick up at front desk.

## 2014-12-02 ENCOUNTER — Encounter: Payer: Self-pay | Admitting: Physical Medicine & Rehabilitation

## 2014-12-02 ENCOUNTER — Ambulatory Visit (HOSPITAL_BASED_OUTPATIENT_CLINIC_OR_DEPARTMENT_OTHER): Payer: Commercial Managed Care - HMO | Admitting: Physical Medicine & Rehabilitation

## 2014-12-02 ENCOUNTER — Other Ambulatory Visit: Payer: Self-pay | Admitting: Physical Medicine & Rehabilitation

## 2014-12-02 ENCOUNTER — Encounter: Payer: Commercial Managed Care - HMO | Attending: Physical Medicine & Rehabilitation

## 2014-12-02 VITALS — BP 126/70 | HR 99 | Resp 16

## 2014-12-02 DIAGNOSIS — Z825 Family history of asthma and other chronic lower respiratory diseases: Secondary | ICD-10-CM | POA: Diagnosis not present

## 2014-12-02 DIAGNOSIS — M47816 Spondylosis without myelopathy or radiculopathy, lumbar region: Secondary | ICD-10-CM | POA: Diagnosis not present

## 2014-12-02 DIAGNOSIS — M25561 Pain in right knee: Secondary | ICD-10-CM

## 2014-12-02 DIAGNOSIS — Z79899 Other long term (current) drug therapy: Secondary | ICD-10-CM

## 2014-12-02 DIAGNOSIS — J449 Chronic obstructive pulmonary disease, unspecified: Secondary | ICD-10-CM | POA: Insufficient documentation

## 2014-12-02 DIAGNOSIS — Z803 Family history of malignant neoplasm of breast: Secondary | ICD-10-CM | POA: Insufficient documentation

## 2014-12-02 DIAGNOSIS — Z87891 Personal history of nicotine dependence: Secondary | ICD-10-CM | POA: Insufficient documentation

## 2014-12-02 DIAGNOSIS — M25562 Pain in left knee: Secondary | ICD-10-CM | POA: Diagnosis present

## 2014-12-02 DIAGNOSIS — M797 Fibromyalgia: Secondary | ICD-10-CM

## 2014-12-02 DIAGNOSIS — Z808 Family history of malignant neoplasm of other organs or systems: Secondary | ICD-10-CM | POA: Insufficient documentation

## 2014-12-02 DIAGNOSIS — Z5181 Encounter for therapeutic drug level monitoring: Secondary | ICD-10-CM

## 2014-12-02 DIAGNOSIS — M545 Low back pain: Secondary | ICD-10-CM | POA: Insufficient documentation

## 2014-12-02 DIAGNOSIS — E119 Type 2 diabetes mellitus without complications: Secondary | ICD-10-CM | POA: Insufficient documentation

## 2014-12-02 DIAGNOSIS — Z811 Family history of alcohol abuse and dependence: Secondary | ICD-10-CM | POA: Diagnosis not present

## 2014-12-02 DIAGNOSIS — Z833 Family history of diabetes mellitus: Secondary | ICD-10-CM | POA: Insufficient documentation

## 2014-12-02 DIAGNOSIS — G894 Chronic pain syndrome: Secondary | ICD-10-CM | POA: Diagnosis not present

## 2014-12-02 NOTE — Patient Instructions (Signed)
We'll need to get results of urine drug screen prior to prescribing any narcotic analgesics X-rays of both knees can be done at the imaging center Back injection at next visit

## 2014-12-02 NOTE — Progress Notes (Signed)
Subjective:    Patient ID: Renee Noble, female    DOB: Oct 06, 1955, 59 y.o.   MRN: [id number] CC:  Bilateral knee pain   HPI 59 year old female who has a history of low back pain. She has been seen by pain management physicians but was recently discharged from there practice due to her insurance. Patient states that over the last several weeks she's had increasing bilateral knee pain. In the past she has had arthroscopic surgery on her knees and was told by the orthopedic surgeon that eventually she will need knee replacements. This was more than 5 years ago. Knee pain increases with with movement as well as standing. Patient states that as long she stays still at night the knee pain doesn't wake her but if she does move it bothers her. He takes a sleeping pill at night.  In regards to her low back. She has had MRI in December 2015. Results were reviewed. She does have lumbar degenerative disc at L3-L4, Also has evidence of lumbar spondylosis at L4-5 and L5-S1  Has some neck pain but this is not as severe as low back pain with the knee pain. 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 Patient states she has occasional left arm numbness involving the whole arm  Patient with a history of morbid obesity however she has reduced her weight from 337-300 pounds and is still losing weight. Also the patient has quit smoking sometime in the last year and since that time has been able to come off her oxygen.  Past history also significant for fibromyalgia syndrome,  Pain Inventory Average Pain 7 Pain Right Now 8 My pain is constant, burning, stabbing, tingling and aching  In the last 24 hours, has pain interfered with the following? General activity 6 Relation with others 5 Enjoyment of life 9 What TIME of day is your pain at its worst? daytime  And evening Sleep (in general) Fair  Pain is worse with: walking, bending,  inactivity and some activites Pain improves with: rest, pacing activities and medication Relief from Meds: 6  Mobility walk without assistance how many minutes can you walk? 10-15 ability to climb steps?  no do you drive?  yes  Function retired  Neuro/Psych weakness numbness tingling spasms dizziness depression anxiety  Prior Studies Any changes since last visit?  no  Physicians involved in your care Any changes since last visit?  no   Family History  Problem Relation Age of Onset  . Heart attack Mother 60    Deceased  . Heart disease Mother   . Emphysema Mother   . COPD Father 79    Deceased  . Emphysema Father   . Alcoholism Father   . Alcoholism Mother   . Esophageal varices Father   . Alcoholism Paternal Grandfather   . Diabetes Maternal Grandmother   . Heart disease Maternal Grandmother   . Lung cancer Maternal Grandfather   . Emphysema Maternal Grandfather   . Brain cancer Maternal Aunt   . Diabetes Sister   . Heart defect Sister   . Obesity Son   . Breast cancer Maternal Aunt    History   Social History  . Marital Status: Single    Spouse Name: N/A  . Number of Children: N/A  . Years of Education: N/A   Social History Main Topics  . Smoking status: Former Smoker -- 2.50 packs/day for 45 years  Types: Cigarettes    Quit date: 09/25/2013  . Smokeless tobacco: Never Used  . Alcohol Use: No  . Drug Use: No  . Sexual Activity: No   Other Topics Concern  . None   Social History Narrative   Past Surgical History  Procedure Laterality Date  . Nephrectomy      Left  . Umbilical hernia repair    . Abdominal hernia repair      x2  . Incisional hernia repair    . Abdominal hysterectomy    . Bladder suspension    . Vagina surgery    . Cholecystectomy    . Wisdom tooth extraction    . Tooth extraction    . Tonsillectomy    . Knee arthroscopy      Bilateral  . Cyst excision      Head   Past Medical History  Diagnosis Date  .  Diabetes     Type II  . Neuropathy     Diabetes  . COPD (chronic obstructive pulmonary disease)   . Renal cancer     Left Kidney Removed  . Fibromyalgia   . Hepatitis C   . Abdominal mass of other site   . Chronic kidney disease, stage 3     Borderline Stage 2-3  . Depression   . Cervical compression fracture   . Hypercholesteremia   . Syncope   . Osteoarthritis   . Hypertension   . Hypothyroidism   . Chronic lower limb pain   . Morbid obesity   . CTS (carpal tunnel syndrome)   . RLS (restless legs syndrome)   . Venous stasis   . OSA (obstructive sleep apnea)    BP 126/70 mmHg  Pulse 99  Resp 16  SpO2 96%  Opioid Risk Score:   Fall Risk Score: High Fall Risk (>13 points)`1  Depression screen PHQ 2/9  Score of 12  No flowsheet data found.   Review of Systems  Constitutional:       Night sweats, skin rash/breakdown, high blood sugar- 99 this am  HENT: Negative.   Eyes: Negative.   Respiratory: Positive for cough.   Cardiovascular: Positive for leg swelling.  Gastrointestinal: Negative.   Endocrine: Negative.   Genitourinary: Negative.   Musculoskeletal: Positive for myalgias, back pain and arthralgias.       Bilateral knee pain  Skin: Negative.   Allergic/Immunologic: Negative.   Neurological: Positive for dizziness, weakness and numbness.       Tingling, spasms  Hematological: Negative.   Psychiatric/Behavioral: Positive for dysphoric mood. The patient is nervous/anxious.        Objective:   Physical Exam  Constitutional: She is oriented to person, place, and time. She appears well-developed and well-nourished.  HENT:  Head: Normocephalic and atraumatic.  Eyes: Conjunctivae and EOM are normal. Pupils are equal, round, and reactive to light.  Neck: Normal range of motion.  Musculoskeletal:       Right shoulder: Normal.       Left shoulder: She exhibits normal range of motion, no deformity and normal strength.       Right elbow: Normal.      Left  elbow: Normal.       Right wrist: Normal.       Left wrist: Normal.       Right hip: She exhibits decreased range of motion. She exhibits no bony tenderness.       Left hip: She exhibits decreased range of motion. She exhibits no  bony tenderness.       Right knee: She exhibits normal range of motion and no effusion. Tenderness found. Medial joint line tenderness noted.       Left knee: She exhibits no effusion. Tenderness found. Medial joint line tenderness noted.       Right ankle: She exhibits decreased range of motion.       Left ankle: She exhibits decreased range of motion. No tenderness.       Cervical back: She exhibits decreased range of motion. She exhibits no tenderness.       Thoracic back: She exhibits decreased range of motion. She exhibits no tenderness.       Lumbar back: She exhibits decreased range of motion and tenderness. She exhibits no deformity.       Right hand: Normal.       Left hand: Normal.  Knees have pain with palpation medial aspect of the joint There is crepitus at bilateral knees No evidence of lateral instability  There is no evidence of scoliosis in the lumbar or thoracic spine.    Neurological: She is alert and oriented to person, place, and time. She displays no atrophy and no tremor. A sensory deficit is present. No cranial nerve deficit. She exhibits normal muscle tone. She displays no seizure activity. Coordination and gait normal. GCS eye subscore is 4. GCS verbal subscore is 5. GCS motor subscore is 6.  Reflex Scores:      Tricep reflexes are 2+ on the right side and 2+ on the left side.      Bicep reflexes are 2+ on the right side and 2+ on the left side.      Brachioradialis reflexes are 2+ on the right side and 2+ on the left side.      Patellar reflexes are 1+ on the right side and 1+ on the left side.      Achilles reflexes are 1+ on the right side and 1+ on the left side. Decreased sensation below the tibial tubercle on the right and  decreased below the patella on the left Normal sensation in both upper extremities.  Motor strength has 5/5 bilateral deltoids, biceps, triceps, grip, hip flexor, knee extensor, ankle dorsiflexor  Negative straight leg raising test  Psychiatric: She has a normal mood and affect.  Nursing note and vitals reviewed.         Assessment & Plan:  1. Bilateral knee pain, medial joint line tenderness with crepitus, suspect osteoarthritis given her age as well as chronic obesity. Reports having had arthroscopic surgery in the past. No recent x-rays. Has tried corticosteroid injections without relief has not tried Visco supplementation  We'll check bilateral 2 views of the knees. Consider Synvisc injection under ultrasound guidance  2. Chronic low back pain mainly axial no significant radicular component.  Has L3-4 disc degeneration as well as spondylosis at L3-4 through L5-S1. No significant stenosis noted. It is possible she could have a dynamic stenosis that is underestimated by the static films  Recommend lumbar medial branch blocks and if helpful on a temporary basis consider radiofrequency neurotomy   3. Fibromyalgia syndrome this is Amplifying the pain in all the areas noted above.  Exercise tolerance is poor given her comorbidities. Consider pool exercise once other conditions are stabilized

## 2014-12-04 ENCOUNTER — Telehealth: Payer: Self-pay | Admitting: Physician Assistant

## 2014-12-04 LAB — PMP ALCOHOL METABOLITE (ETG): Ethyl Glucuronide (EtG): NEGATIVE ng/mL

## 2014-12-04 NOTE — Telephone Encounter (Signed)
EMR reviewed. She saw Pain Specialist Monday. Has urine drug screen pending. Pain specialist will take over pain medications once UDS results. I cannot give refill because if pain specialist sees she is still getting from me they will dismiss her from practice.

## 2014-12-04 NOTE — Telephone Encounter (Signed)
Patient called Renee Noble stating that her PCP is refusing to cover her pain medications while Dr. Letta Pate awaits the results of her UDS.  The referring PCP is under the belief that we will kick the patient out of our program if he writes her a 2 week bridge script.  Seabron Spates this is not the case and this is starting to become a consistent problem....please advise

## 2014-12-04 NOTE — Telephone Encounter (Signed)
Caller name:Rania Relationship to patient:self Can be [phone] Pharmacy:  Reason for call: she only needs 60 of the oxycodone this month as she starts a new pain doctor soon.

## 2014-12-05 ENCOUNTER — Encounter: Payer: Self-pay | Admitting: Physical Medicine & Rehabilitation

## 2014-12-05 NOTE — Telephone Encounter (Signed)
There  is a letter in [place] from our clinic regarding New Patient Information for patient Renee Noble     DOB: Feb 19, 1956.  The bottom paragraph of the letter states, "We will obtain a urine drug screen, after receipt of results the evaluating MD will determine if any narcotic medications will be prescribed or not. Turn around time for results are usually two weeks. If the patient is to continue with any narcotic medications in this interim time frame, they will need to be prescribed by the referring physician office".  There is no penalty to the patient while going through this transition. If you have any questions you can call our clinic manager Anner Crete.  She can be reached at [phone].

## 2014-12-05 NOTE — Telephone Encounter (Signed)
Thank you for making me aware of of this. Our office will now know that you generate letters regarding new patient and filling medication gaps. Did not want to jeopardize patient's ability to remain in your clinic as this has been an ongoing problem for [place] in primary care regarding pain management clinics. I have no issue covering her if Pain Specialist is fine with this. Will write Rx when back in office tomorrow.

## 2014-12-06 ENCOUNTER — Telehealth: Payer: Self-pay | Admitting: Physician Assistant

## 2014-12-06 MED ORDER — OXYCODONE HCL 15 MG PO TABS: 15.0000 mg | ORAL_TABLET | Freq: Four times a day (QID) | ORAL | Status: DC | PRN

## 2014-12-06 NOTE — Telephone Encounter (Signed)
Will you please tell patient I spoke with Dr. Gardenia Phlegm who explained situation to me. Refill of pain medication (60 tablets) is at front desk ready for pick up.

## 2014-12-07 LAB — OPIATES/OPIOIDS (LC/MS-MS)
CODEINE URINE: NEGATIVE ng/mL (ref <50)
HYDROCODONE: NEGATIVE ng/mL (ref <50)
Hydromorphone: NEGATIVE ng/mL (ref <50)
Morphine Urine: NEGATIVE ng/mL (ref <50)
NORHYDROCODONE, UR: NEGATIVE ng/mL (ref <50)
NOROXYCODONE, UR: 8518 ng/mL (ref <50)
OXYCODONE, UR: 4741 ng/mL (ref <50)
Oxymorphone: 1568 ng/mL (ref <50)

## 2014-12-07 LAB — OXYCODONE, URINE (LC/MS-MS)
Noroxycodone, Ur: 8518 ng/mL (ref <50)
OXYCODONE, UR: 4741 ng/mL (ref <50)
Oxymorphone: 1568 ng/mL (ref <50)

## 2014-12-07 LAB — TRAMADOL, URINE
N-DESMETHYL-CIS-TRAMADOL: 1862 ng/mL — AB (ref <100)
Tramadol, Urine: 10804 ng/mL — AB (ref <100)

## 2014-12-10 ENCOUNTER — Other Ambulatory Visit: Payer: Self-pay | Admitting: *Deleted

## 2014-12-10 DIAGNOSIS — E785 Hyperlipidemia, unspecified: Secondary | ICD-10-CM

## 2014-12-10 LAB — PRESCRIPTION MONITORING PROFILE (SOLSTAS)
Amphetamine/Meth: NEGATIVE ng/mL
Barbiturate Screen, Urine: NEGATIVE ng/mL
Benzodiazepine Screen, Urine: NEGATIVE ng/mL
Buprenorphine, Urine: NEGATIVE ng/mL
Cannabinoid Scrn, Ur: NEGATIVE ng/mL
Carisoprodol, Urine: NEGATIVE ng/mL
Cocaine Metabolites: NEGATIVE ng/mL
Creatinine, Urine: 113.88 mg/dL
Fentanyl, Ur: NEGATIVE ng/mL
MDMA URINE: NEGATIVE ng/mL
Meperidine, Ur: NEGATIVE ng/mL
Methadone Screen, Urine: NEGATIVE ng/mL
Nitrites, Initial: NEGATIVE ug/mL
Propoxyphene: NEGATIVE ng/mL
Tapentadol, urine: NEGATIVE ng/mL
Zolpidem, Urine: NEGATIVE ng/mL
pH, Initial: 5.7 pH (ref 4.5–8.9)

## 2014-12-10 MED ORDER — PRAVASTATIN SODIUM 20 MG PO TABS: 20.0000 mg | ORAL_TABLET | Freq: Every day | ORAL | Status: DC

## 2014-12-10 MED ORDER — FUROSEMIDE 20 MG PO TABS: 20.0000 mg | ORAL_TABLET | Freq: Every day | ORAL | Status: DC | PRN

## 2014-12-10 NOTE — Telephone Encounter (Signed)
Rx's sent to the pharmacy by e-script.  Pt needs to schedule a complete physical and fasting labs.//AB/CMA

## 2014-12-12 ENCOUNTER — Ambulatory Visit
Admission: RE | Admit: 2014-12-12 | Discharge: 2014-12-12 | Disposition: A | Discharge status: Home / Self Care | Payer: Commercial Managed Care - HMO | Source: Ambulatory Visit | Attending: Physical Medicine & Rehabilitation | Admitting: Physical Medicine & Rehabilitation

## 2014-12-12 DIAGNOSIS — M25561 Pain in right knee: Secondary | ICD-10-CM

## 2014-12-12 DIAGNOSIS — M25562 Pain in left knee: Principal | ICD-10-CM

## 2014-12-13 NOTE — Progress Notes (Addendum)
Urine drug screen for this encounter is inconsistent. Positive for prescribed oxycodone, but is also positivie for high amount of tramadol.  There is no record on [facility] for Rx in last year. Per Dr Letta Pate she will be non narcotic treatment only.                           Charlett Blake, MD   Sent: Fri December 13, 2014 3:34 PM    To: Caro Hight, RN        Message     Will do nonnarcotic treatment only

## 2014-12-17 ENCOUNTER — Other Ambulatory Visit: Payer: Self-pay

## 2014-12-17 DIAGNOSIS — E119 Type 2 diabetes mellitus without complications: Secondary | ICD-10-CM

## 2014-12-17 DIAGNOSIS — Z794 Long term (current) use of insulin: Principal | ICD-10-CM

## 2014-12-17 MED ORDER — INSULIN GLARGINE 100 UNIT/ML ~~LOC~~ SOLN: 30.0000 [IU] | Freq: Every day | SUBCUTANEOUS | Status: DC

## 2014-12-18 ENCOUNTER — Telehealth: Payer: Self-pay | Admitting: *Deleted

## 2014-12-18 ENCOUNTER — Encounter: Payer: Commercial Managed Care - HMO | Admitting: Registered Nurse

## 2014-12-18 NOTE — Telephone Encounter (Signed)
I spoke with Renee Noble and gave her Dr Letta Pate decision on  non narcotic treatment only. She does recall taking some of her cousins Ultram because she had run out of her pain medication.  I was offering her to cancel today's appointment since [facility] can offer her nothing but an exam at this point.  She has agreed to cancel and keep the appointment with Dr Letta Pate on 01/10/15 for an injection.  We have placed her on a cancellation list if an appointment opens earlier.  In reviewing her drug screen after we concluded our conversation-because she told me she took the tramadol because she had run out of her medication-  Her test was positive for very high levels of oxycodone and its metabolites which does not match up with the explanation.  She will discuss plans going forward with Dr Letta Pate.

## 2015-01-09 ENCOUNTER — Other Ambulatory Visit: Payer: Self-pay | Admitting: Physician Assistant

## 2015-01-09 DIAGNOSIS — J449 Chronic obstructive pulmonary disease, unspecified: Secondary | ICD-10-CM

## 2015-01-09 MED ORDER — ALBUTEROL SULFATE HFA 108 (90 BASE) MCG/ACT IN AERS: 1.0000 | INHALATION_SPRAY | Freq: Four times a day (QID) | RESPIRATORY_TRACT | Status: DC | PRN

## 2015-01-10 ENCOUNTER — Ambulatory Visit (HOSPITAL_BASED_OUTPATIENT_CLINIC_OR_DEPARTMENT_OTHER): Payer: Commercial Managed Care - HMO | Admitting: Physical Medicine & Rehabilitation

## 2015-01-10 ENCOUNTER — Encounter: Payer: Self-pay | Admitting: Physical Medicine & Rehabilitation

## 2015-01-10 ENCOUNTER — Encounter: Payer: Commercial Managed Care - HMO | Attending: Physical Medicine & Rehabilitation

## 2015-01-10 VITALS — BP 130/73 | HR 100 | Resp 14

## 2015-01-10 DIAGNOSIS — G894 Chronic pain syndrome: Secondary | ICD-10-CM | POA: Diagnosis not present

## 2015-01-10 DIAGNOSIS — Z833 Family history of diabetes mellitus: Secondary | ICD-10-CM | POA: Diagnosis not present

## 2015-01-10 DIAGNOSIS — M545 Low back pain: Secondary | ICD-10-CM | POA: Insufficient documentation

## 2015-01-10 DIAGNOSIS — M25561 Pain in right knee: Secondary | ICD-10-CM | POA: Insufficient documentation

## 2015-01-10 DIAGNOSIS — E119 Type 2 diabetes mellitus without complications: Secondary | ICD-10-CM | POA: Insufficient documentation

## 2015-01-10 DIAGNOSIS — Z87891 Personal history of nicotine dependence: Secondary | ICD-10-CM | POA: Insufficient documentation

## 2015-01-10 DIAGNOSIS — J449 Chronic obstructive pulmonary disease, unspecified: Secondary | ICD-10-CM | POA: Diagnosis not present

## 2015-01-10 DIAGNOSIS — M25562 Pain in left knee: Secondary | ICD-10-CM | POA: Insufficient documentation

## 2015-01-10 DIAGNOSIS — Z811 Family history of alcohol abuse and dependence: Secondary | ICD-10-CM | POA: Diagnosis not present

## 2015-01-10 DIAGNOSIS — M797 Fibromyalgia: Secondary | ICD-10-CM | POA: Diagnosis not present

## 2015-01-10 DIAGNOSIS — Z825 Family history of asthma and other chronic lower respiratory diseases: Secondary | ICD-10-CM | POA: Diagnosis not present

## 2015-01-10 DIAGNOSIS — Z808 Family history of malignant neoplasm of other organs or systems: Secondary | ICD-10-CM | POA: Insufficient documentation

## 2015-01-10 DIAGNOSIS — M47816 Spondylosis without myelopathy or radiculopathy, lumbar region: Secondary | ICD-10-CM | POA: Diagnosis not present

## 2015-01-10 DIAGNOSIS — Z803 Family history of malignant neoplasm of breast: Secondary | ICD-10-CM | POA: Insufficient documentation

## 2015-01-10 NOTE — Progress Notes (Signed)
Bilateral Lumbar L2, L3, L4  medial branch blocks and L 5 dorsal ramus injection under fluoroscopic guidance  Indication: Lumbar pain which is not relieved by medication management or other conservative care and interfering with self-care and mobility.  Informed consent was obtained after describing risks and benefits of the procedure with the patient, this includes bleeding, infection, paralysis and medication side effects.  The patient wishes to proceed and has given written consent.  The patient was placed in prone position.  The lumbar area was marked and prepped with Betadine.  One mL of 1% lidocaine was injected into each of 6 areas into the skin and subcutaneous tissue.  Then a 22-gauge 5inch spinal needle was inserted targeting the junction of the left S1 superior articular process and sacral ala junction. Needle was advanced under fluoroscopic guidance.  Bone contact was made.  Omnipaque 180 was injected x 0.5 mL demonstrating no intravascular uptake.  Then a solution containing one mL of 4 mg per mL dexamethasone and 3 mL of 2% MPF lidocaine was injected x 0.5 mL.  Then the left L5 superior articular process in transverse process junction was targeted.  Bone contact was made.  Omnipaque 180 was injected x 0.5 mL demonstrating no intravascular uptake. Then a solution containing one mL of 4 mg per mL dexamethasone and 3 mL of 2% MPF lidocaine was injected x 0.5 mL.  Then the left L4 superior articular process in transverse process junction was targeted.  Bone contact was made.  Omnipaque 180 was injected x 0.5 mL demonstrating no intravascular uptake.  Then a solution containing one mL of 4 mg per mL dexamethasone and 3 mL if 2% MPF lidocaine was injected x 0.5 mL.Then the left L3 superior articular process in transverse process junction was targeted.  Bone contact was made.  Omnipaque 180 was injected x 0.5 mL demonstrating no intravascular uptake.  Then a solution containing one mL of 4 mg per mL  dexamethasone and 3 mL if 2% MPF lidocaine was injected x 0.5 mL.   This same procedure was performed on the right side using the same needle, technique and injectate.  Patient tolerated procedure well.  Post procedure instructions were given.

## 2015-01-10 NOTE — Progress Notes (Signed)
  [facility]   Name: Renee Noble DOB:12/21/1955 MRN: [id number]  Date:01/10/2015  Physician: Alysia Penna, MD    Nurse/CMA: Wenda Overland  Allergies:  Allergies  Allergen Reactions  . Gabapentin Anaphylaxis  . Ketorolac Tromethamine Hives  . Lisinopril Cough    Consent Signed: Yes.    Is patient diabetic? Yes.    CBG today? 46  Pregnant: No. LMP: No LMP recorded. Patient has had a hysterectomy. (age [age])  Anticoagulants: no Anti-inflammatory: no Antibiotics: no  Procedure: Bilateral Medial Branch Block L2-3-4-5  Position: Prone Start Time: 325  End Time: 338 Fluoro Time: 57  RN/CMA Mancel Parsons Sybil Shumaker    Time 3:07 pm 344    BP 130/73 147/82    Pulse 100 103    Respirations 14 14    O2 Sat 92 94    S/S 6 6    Pain Level 10/10 7/10     D/C home with Orpah Greek, patient A & O X 3, D/C instructions reviewed, and sits independently.

## 2015-01-15 ENCOUNTER — Ambulatory Visit: Payer: Commercial Managed Care - HMO | Admitting: Physician Assistant

## 2015-01-15 ENCOUNTER — Encounter: Payer: Self-pay | Admitting: Physician Assistant

## 2015-01-15 ENCOUNTER — Ambulatory Visit (INDEPENDENT_AMBULATORY_CARE_PROVIDER_SITE_OTHER): Payer: Commercial Managed Care - HMO | Admitting: Physician Assistant

## 2015-01-15 VITALS — BP 118/72 | HR 100 | Temp 98.1°F | Resp 16 | Ht 64.0 in | Wt 293.1 lb

## 2015-01-15 DIAGNOSIS — G8929 Other chronic pain: Secondary | ICD-10-CM

## 2015-01-15 DIAGNOSIS — E119 Type 2 diabetes mellitus without complications: Secondary | ICD-10-CM

## 2015-01-15 DIAGNOSIS — Z7189 Other specified counseling: Secondary | ICD-10-CM | POA: Diagnosis not present

## 2015-01-15 DIAGNOSIS — R350 Frequency of micturition: Secondary | ICD-10-CM | POA: Insufficient documentation

## 2015-01-15 DIAGNOSIS — K469 Unspecified abdominal hernia without obstruction or gangrene: Secondary | ICD-10-CM | POA: Diagnosis not present

## 2015-01-15 DIAGNOSIS — Z794 Long term (current) use of insulin: Secondary | ICD-10-CM | POA: Diagnosis not present

## 2015-01-15 LAB — POCT URINALYSIS DIPSTICK
Bilirubin, UA: NEGATIVE
Glucose, UA: NEGATIVE
KETONES UA: NEGATIVE
Nitrite, UA: NEGATIVE
Protein, UA: POSITIVE
SPEC GRAV UA: 1.02
Urobilinogen, UA: 1
pH, UA: 6

## 2015-01-15 LAB — BASIC METABOLIC PANEL
BUN: 18 mg/dL (ref 6–23)
CHLORIDE: 98 meq/L (ref 96–112)
CO2: 34 meq/L — AB (ref 19–32)
Calcium: 9.4 mg/dL (ref 8.4–10.5)
Creatinine, Ser: 1.06 mg/dL (ref 0.40–1.20)
GFR: 55.92 mL/min — ABNORMAL LOW (ref >60.00)
Glucose, Bld: 106 mg/dL — ABNORMAL HIGH (ref 70–99)
Potassium: 4 mEq/L (ref 3.5–5.1)
Sodium: 138 mEq/L (ref 135–145)

## 2015-01-15 LAB — HEMOGLOBIN A1C: Hgb A1c MFr Bld: 6.5 % (ref 4.6–6.5)

## 2015-01-15 MED ORDER — CEPHALEXIN 500 MG PO CAPS: 500.0000 mg | ORAL_CAPSULE | Freq: Two times a day (BID) | ORAL | Status: DC

## 2015-01-15 MED ORDER — OXYCODONE HCL 15 MG PO TABS: 15.0000 mg | ORAL_TABLET | Freq: Three times a day (TID) | ORAL | Status: DC | PRN

## 2015-01-15 NOTE — Assessment & Plan Note (Signed)
Reviewed notes received from [facility]. She was supposed to schedule pulmonary surgical clearance with Dr. Melvyn Novas so they could proceed with surgery. Encouraged patient to set this up as well as a follow-up with Dr. Dalbert Batman and [facility].

## 2015-01-15 NOTE — Assessment & Plan Note (Signed)
Urine dip + LE and blood. Will begin Keflex 500 mg BID due to renal impairment. Increase fluids. Probiotic. Urine sent for culture. Will alter regimen based on results.

## 2015-01-15 NOTE — Patient Instructions (Addendum)
Please go to the lab for blood work. I will call you with your results.  Schedule a follow-up with Dr. Melvyn Novas for pulmonary surgical clearance as directed by Dr. Dalbert Batman. Follow-up with Dr. Dalbert Batman at [facility].  You will be contacted regarding new pain management referral. I will refill pain medication but we will start weaning down to three times daily instead of four times daily. I do not do chronic pain management, so we will continue to wean until you are seen by new specialist.  I have sent your urine for culture.  Please take the antibiotic (Keflex) as directed. Stay hydrated. I will call you with your culture results.

## 2015-01-15 NOTE — Assessment & Plan Note (Signed)
Record reviewed. Patient tested + for Tramadol which she was not prescribed and still tested highly + for Oxycodone that she is prescribed. Was told by specialist she could be given non-narcotic treatment only. Will refer her to Dr. Nelva Bush but informed patient that cannot guarantee he will take her. Will grant one-time Rx for Oxycodone but will decrease to TID from QID as prescribed previously.

## 2015-01-15 NOTE — Assessment & Plan Note (Signed)
Will check BMP and A1C today to assess glycemic control. Encouraged diet and exercise regimen although this is limited due to chronic back pain.

## 2015-01-15 NOTE — Progress Notes (Signed)
Pre visit review using our clinic review tool, if applicable. No additional management support is needed unless otherwise documented below in the visit note/SLS  

## 2015-01-15 NOTE — Progress Notes (Signed)
Patient presents to clinic today to discuss multiple issues.  Patient c/o urinary urgency and frequency over the past week with suprapubic pressure. Denies fever, chills, dysuria. Denies flank pain or hematuria. Has + hx of UTI.  Patient requests referral to new Pain Specialist. Patient currently followed by Dr. Joan Mayans. Patient states provider has been rude to her and she would like to see Dr. Nelva Bush instead.  Patient also states she has not heard from [facility] regarding surgery for her incisional hernia. Endorses seeing them in April and having CT scan. States she never received results or surgery appointment. Has not tried to reach specialist regarding this.  Patient also due for follow-up of DM previously uncontrolled. LAst A1C at 8.8. Patient endorses taking medications as directed with fasting CBGs averaging 100-120.   Past Medical History  Diagnosis Date  . Diabetes     Type II  . Neuropathy     Diabetes  . COPD (chronic obstructive pulmonary disease)   . Renal cancer     Left Kidney Removed  . Fibromyalgia   . Hepatitis C   . Abdominal mass of other site   . Chronic kidney disease, stage 3     Borderline Stage 2-3  . Depression   . Cervical compression fracture   . Hypercholesteremia   . Syncope   . Osteoarthritis   . Hypertension   . Hypothyroidism   . Chronic lower limb pain   . Morbid obesity   . CTS (carpal tunnel syndrome)   . RLS (restless legs syndrome)   . Venous stasis   . OSA (obstructive sleep apnea)     Current Outpatient Prescriptions on File Prior to Visit  Medication Sig Dispense Refill  . albuterol (PROVENTIL HFA;VENTOLIN HFA) 108 (90 BASE) MCG/ACT inhaler Inhale 1-2 puffs into the lungs every 6 (six) hours as needed for wheezing or shortness of breath. 3.7 g 3  . Bisacodyl (LAXATIVE PO) Take by mouth as needed.    . clotrimazole-betamethasone (LOTRISONE) cream Apply 1 application topically 2 (two) times daily. 30 g 0  . Fluticasone-Salmeterol  (ADVAIR DISKUS) 500-50 MCG/DOSE AEPB Inhale 1 puff into the lungs once. 60 each 5  . furosemide (LASIX) 20 MG tablet Take 1 tablet (20 mg total) by mouth daily as needed. 30 tablet 0  . insulin glargine (LANTUS) 100 UNIT/ML injection Inject 0.3 mLs (30 Units total) into the skin daily. 10 mL 1  . insulin lispro (HUMALOG) 100 UNIT/ML injection Inject 0.15 mLs (15 Units total) into the skin 3 (three) times daily before meals. 10 mL 5  . Insulin Syringe-Needle U-100 30G X 1/2" 1 ML MISC Use 4 times daily as directed. 100 each 3  . levothyroxine (SYNTHROID, LEVOTHROID) 75 MCG tablet Take 1 tablet (75 mcg total) by mouth daily before breakfast. 90 tablet 1  . nortriptyline (PAMELOR) 75 MG capsule Take 1 capsule (75 mg total) by mouth at bedtime. 30 capsule 5  . nystatin (MYCOSTATIN/NYSTOP) 100000 UNIT/GM POWD Apply topically.    . polycarbophil (FIBERCON) 625 MG tablet Take 625 mg by mouth daily.    . pravastatin (PRAVACHOL) 20 MG tablet Take 1 tablet (20 mg total) by mouth daily. 30 tablet 0  . sitaGLIPtin (JANUVIA) 50 MG tablet Take 1 tablet (50 mg total) by mouth daily. 90 tablet 1  . tiotropium (SPIRIVA) 18 MCG inhalation capsule Place 1 capsule (18 mcg total) into inhaler and inhale daily. 30 capsule 6  . zolpidem (AMBIEN) 10 MG tablet Take 1 tablet (  10 mg total) by mouth at bedtime as needed for sleep. 30 tablet 5   No current facility-administered medications on file prior to visit.    Allergies  Allergen Reactions  . Gabapentin Anaphylaxis  . Ketorolac Tromethamine Hives  . Lisinopril Cough    Family History  Problem Relation Age of Onset  . Heart attack Mother 39    Deceased  . Heart disease Mother   . Emphysema Mother   . COPD Father 70    Deceased  . Emphysema Father   . Alcoholism Father   . Alcoholism Mother   . Esophageal varices Father   . Alcoholism Paternal Grandfather   . Diabetes Maternal Grandmother   . Heart disease Maternal Grandmother   . Lung cancer Maternal  Grandfather   . Emphysema Maternal Grandfather   . Brain cancer Maternal Aunt   . Diabetes Sister   . Heart defect Sister   . Obesity Son   . Breast cancer Maternal Aunt     Social History   Social History  . Marital Status: Single    Spouse Name: N/A  . Number of Children: N/A  . Years of Education: N/A   Social History Main Topics  . Smoking status: Former Smoker -- 2.50 packs/day for 45 years    Types: Cigarettes    Quit date: 09/25/2013  . Smokeless tobacco: Never Used  . Alcohol Use: No  . Drug Use: No  . Sexual Activity: No   Other Topics Concern  . None   Social History Narrative    Review of Systems - See HPI.  All other ROS are negative.  BP 118/72 mmHg  Pulse 100  Temp(Src) 98.1 F (36.7 C) (Oral)  Resp 16  Ht 5\' 4"  (1.626 m)  Wt 293 lb 2 oz (132.961 kg)  BMI 50.29 kg/m2  SpO2 95%  Physical Exam  Constitutional: She is well-developed, well-nourished, and in no distress.  HENT:  Head: Normocephalic and atraumatic.  Eyes: Conjunctivae are normal.  Cardiovascular: Normal rate, regular rhythm, normal heart sounds and intact distal pulses.   Pulmonary/Chest: Effort normal.  Abdominal: There is no tenderness.  Skin: Skin is warm and dry. No rash noted.  Vitals reviewed.   Recent Results (from the past 2160 hour(s))  PMP Alcohol Metabolite (ETG)     Status: None   Collection Time: 12/02/14  1:51 PM  Result Value Ref Range   Ethyl Glucuronide (EtG) NEG Cutoff:500 ng/mL  Prescription Monitoring Profile-[facility]     Status: None   Collection Time: 12/02/14  1:51 PM  Result Value Ref Range   Creatinine, Urine 113.88 >20.0 mg/dL   pH, Initial 5.7 4.5 - 8.9 pH   Nitrites, Initial NEG Cutoff:200 ug/mL   Amphetamine/Meth NEG Cutoff:500 ng/mL   Barbiturate Screen, Urine NEG Cutoff:200 ng/mL   Benzodiazepine Screen, Urine NEG Cutoff:100 ng/mL   Buprenorphine, Urine NEG Cutoff:10 ng/mL   Cannabinoid Scrn, Ur NEG Cutoff:50 ng/mL   Cocaine Metabolites NEG  Cutoff:150 ng/mL   MDMA URINE NEG Cutoff:500 ng/mL   Methadone Screen, Urine NEG Cutoff:300 ng/mL   Oxycodone Screen, Ur PPS Cutoff:100 ng/mL   Tramadol Scrn, Ur PPS Cutoff:200 ng/mL   Propoxyphene NEG Cutoff:300 ng/mL   Tapentadol, urine NEG Cutoff:200 ng/mL   Opiate Screen, Urine PPS Cutoff:100 ng/mL   Zolpidem, Urine NEG Cutoff:20 ng/mL   Fentanyl, Ur NEG Cutoff:2 ng/mL   Meperidine, Ur NEG Cutoff:200 ng/mL   Carisoprodol, Urine NEG Cutoff:100 ng/mL   Prescribed Drug 1 OXYCODONE  Comment: * (PPS) Presumptive positive screen result to be verified by         quantitative LC/MS or GC/MS confirmation testing.   Opiates/Opioids (LC/MS-MS)     Status: None   Collection Time: 12/02/14  1:51 PM  Result Value Ref Range   Codeine Urine NEG <50 ng/mL   Hydrocodone NEG <50 ng/mL   Hydromorphone NEG <50 ng/mL   Morphine Urine NEG <50 ng/mL   Norhydrocodone, Ur NEG <50 ng/mL   Noroxycodone, Ur 8518 <50 ng/mL   Oxycodone, ur 4741 <50 ng/mL   Oxymorphone 1568 <50 ng/mL  Oxycodone, Urine (LC/MS-MS)     Status: None   Collection Time: 12/02/14  1:51 PM  Result Value Ref Range   Noroxycodone, Ur 8518 <50 ng/mL   Oxycodone, ur 4741 <50 ng/mL   Oxymorphone 1568 <50 ng/mL  Tramadol, urine     Status: Abnormal   Collection Time: 12/02/14  1:51 PM  Result Value Ref Range   Tramadol, Urine 10804 (A) <100 ng/mL   N-DESMETHYL-CIS-TRAMADOL 1862 (A) <100 ng/mL  POCT urinalysis dipstick     Status: Abnormal   Collection Time: 01/15/15  1:53 PM  Result Value Ref Range   Color, UA yellow    Clarity, UA cloudy    Glucose, UA neg    Bilirubin, UA neg    Ketones, UA neg    Spec Grav, UA 1.020    Blood, UA small    pH, UA 6.0    Protein, UA positive+    Urobilinogen, UA 1.0    Nitrite, UA neg    Leukocytes, UA moderate (2+) (A) Negative    Assessment/Plan: Diabetes mellitus, type II, insulin dependent Will check BMP and A1C today to assess glycemic control. Encouraged diet and exercise  regimen although this is limited due to chronic back pain.  Urinary frequency Urine dip + LE and blood. Will begin Keflex 500 mg BID due to renal impairment. Increase fluids. Probiotic. Urine sent for culture. Will alter regimen based on results.  Encounter for chronic pain management Record reviewed. Patient tested + for Tramadol which she was not prescribed and still tested highly + for Oxycodone that she is prescribed. Was told by specialist she could be given non-narcotic treatment only. Will refer her to Dr. Nelva Bush but informed patient that cannot guarantee he will take her. Will grant one-time Rx for Oxycodone but will decrease to TID from QID as prescribed previously.   Hernia of abdominal cavity Reviewed notes received from [facility]. She was supposed to schedule pulmonary surgical clearance with Dr. Melvyn Novas so they could proceed with surgery. Encouraged patient to set this up as well as a follow-up with Dr. Dalbert Batman and [facility].

## 2015-01-17 LAB — CULTURE, URINE COMPREHENSIVE: Colony Count: >=100000

## 2015-02-07 ENCOUNTER — Ambulatory Visit: Payer: Commercial Managed Care - HMO | Admitting: Physical Medicine & Rehabilitation

## 2015-02-07 ENCOUNTER — Telehealth: Payer: Self-pay | Admitting: *Deleted

## 2015-02-07 DIAGNOSIS — Z794 Long term (current) use of insulin: Principal | ICD-10-CM

## 2015-02-07 DIAGNOSIS — E119 Type 2 diabetes mellitus without complications: Secondary | ICD-10-CM

## 2015-02-07 MED ORDER — INSULIN LISPRO 100 UNIT/ML ~~LOC~~ SOLN: 15.0000 [IU] | Freq: Three times a day (TID) | SUBCUTANEOUS | Status: DC

## 2015-02-07 NOTE — Telephone Encounter (Signed)
Rx request to pharmacy/SLS  

## 2015-02-12 ENCOUNTER — Telehealth: Payer: Self-pay | Admitting: Physician Assistant

## 2015-02-12 DIAGNOSIS — E119 Type 2 diabetes mellitus without complications: Secondary | ICD-10-CM

## 2015-02-12 DIAGNOSIS — G8929 Other chronic pain: Secondary | ICD-10-CM

## 2015-02-12 DIAGNOSIS — Z794 Long term (current) use of insulin: Secondary | ICD-10-CM

## 2015-02-12 MED ORDER — INSULIN GLARGINE 100 UNIT/ML ~~LOC~~ SOLN: 30.0000 [IU] | Freq: Every day | SUBCUTANEOUS | Status: DC

## 2015-02-12 MED ORDER — OXYCODONE HCL 15 MG PO TABS: 15.0000 mg | ORAL_TABLET | Freq: Three times a day (TID) | ORAL | Status: DC | PRN

## 2015-02-12 NOTE — Telephone Encounter (Signed)
Please inform patient that her pain medication refill is at front desk for pick up. I have not received insulin refill requests personally from pharmacy. Will you call patient to see which insulin she is needing as she has both long-acting and short-acting.

## 2015-02-12 NOTE — Telephone Encounter (Signed)
Medication Detail      Disp Refills Start End     insulin lispro (HUMALOG) 100 UNIT/ML injection 10 mL 5 02/07/2015     Sig - Route: Inject 0.15 mLs (15 Units total) into the skin 3 (three) times daily before meals. - Subcutaneous    Notes to Pharmacy: D/C PREVIOUS SCRIPTS FOR THIS MEDICATION    E-Prescribing Status: Receipt confirmed by pharmacy (02/07/2015 5:08 PM EDT)   Associated Diagnoses    Diabetes mellitus, type II, insulin dependent - [place], [place] - [address]   Patient informed, understood & agreed; needs Lantus insulin refill; Refill sent per [facility] refill protocol/SLS

## 2015-02-12 NOTE — Telephone Encounter (Signed)
Caller name: Millisa Giarrusso  Relationship to patient: Self   Can be reached: [phone]  Pharmacy: [facility], [place] - [address]   Reason for call: pt is requesting a refill on her Oxycodone Rx.        ALSO, pt says that her dog knocked over her insulin so she has been out over the weekend. She says that the pharmacy says that they faxed over a request for refill on it but hasn't received a response. Please advise.     Thanks.

## 2015-02-21 ENCOUNTER — Other Ambulatory Visit: Payer: Self-pay | Admitting: Physician Assistant

## 2015-03-17 ENCOUNTER — Telehealth: Payer: Self-pay | Admitting: Physician Assistant

## 2015-03-17 ENCOUNTER — Other Ambulatory Visit: Payer: Self-pay | Admitting: Physician Assistant

## 2015-03-17 DIAGNOSIS — G8929 Other chronic pain: Secondary | ICD-10-CM

## 2015-03-17 MED ORDER — OXYCODONE HCL 15 MG PO TABS: 15.0000 mg | ORAL_TABLET | Freq: Three times a day (TID) | ORAL | Status: DC | PRN

## 2015-03-17 NOTE — Telephone Encounter (Signed)
Caller name: Jelena Malicoat  Relationship to patient: Self   Can be reached: [phone]   Pharmacy: [facility], [place] - [address]   Reason for call: Pt need a refill on her oxyCODONE  Rx.

## 2015-03-17 NOTE — Telephone Encounter (Signed)
Refill ready for pick up at front desk.

## 2015-03-17 NOTE — Telephone Encounter (Signed)
Patient Unavailable; spouse informed, understood & agreed, will p/u during regular business hours/SLS

## 2015-03-21 ENCOUNTER — Other Ambulatory Visit: Payer: Self-pay | Admitting: Physician Assistant

## 2015-03-21 MED ORDER — FLUTICASONE-SALMETEROL 500-50 MCG/DOSE IN AEPB: 1.0000 | INHALATION_SPRAY | Freq: Once | RESPIRATORY_TRACT | Status: DC

## 2015-04-04 ENCOUNTER — Telehealth: Payer: Self-pay | Admitting: *Deleted

## 2015-04-04 MED ORDER — NORTRIPTYLINE HCL 75 MG PO CAPS: 75.0000 mg | ORAL_CAPSULE | Freq: Every day | ORAL | Status: DC

## 2015-04-04 NOTE — Telephone Encounter (Signed)
Rx request to pharmacy, [facility] with contact name and number RE: request Rx to pharmacy and she will be due for F/U at end of Feb per provider instructions/SLS

## 2015-04-17 ENCOUNTER — Ambulatory Visit (INDEPENDENT_AMBULATORY_CARE_PROVIDER_SITE_OTHER): Payer: Commercial Managed Care - HMO | Admitting: Behavioral Health

## 2015-04-17 ENCOUNTER — Telehealth: Payer: Self-pay | Admitting: Physician Assistant

## 2015-04-17 DIAGNOSIS — Z23 Encounter for immunization: Secondary | ICD-10-CM | POA: Diagnosis not present

## 2015-04-17 DIAGNOSIS — G8929 Other chronic pain: Secondary | ICD-10-CM

## 2015-04-17 NOTE — Progress Notes (Signed)
Pre visit review using our clinic review tool, if applicable. No additional management support is needed unless otherwise documented below in the visit note. 

## 2015-04-17 NOTE — Telephone Encounter (Signed)
Caller name: Self   Can be reached: [phone]   Reason for call: Requesting refill on oxyCODONE (ROXICODONE) 15 MG immediate release tablet [id number] and will get Flu Shot when she picks up rx. Tomorrow 12/2

## 2015-04-17 NOTE — Telephone Encounter (Signed)
Caller name: Self   Can be reached: [phone]   Reason for call: Requesting refill on oxyCODONE (ROXICODONE) 15 MG immediate release tablet [id number] and will get Flu Shot when she picks up rx. Tomorrow 12/2

## 2015-04-17 NOTE — Telephone Encounter (Signed)
Called patient to check on appointment for flu shot. States she was scheduled to come in tomorrow and is supposed to pick up Rx for Oxycodone at the same time. Forwarded message to provider for medication approval because it is a Narcotic.

## 2015-04-18 MED ORDER — OXYCODONE HCL 15 MG PO TABS: 15.0000 mg | ORAL_TABLET | Freq: Three times a day (TID) | ORAL | Status: DC | PRN

## 2015-04-18 NOTE — Telephone Encounter (Signed)
Refill granted. At front desk. We will begin weaning down on medication next month as stated before I am not a chronic pain management specialist.  Please remind her that there is a 72 hour turn around for these medications.

## 2015-04-28 ENCOUNTER — Telehealth: Payer: Self-pay | Admitting: Physician Assistant

## 2015-04-28 DIAGNOSIS — Z794 Long term (current) use of insulin: Secondary | ICD-10-CM

## 2015-04-28 DIAGNOSIS — E785 Hyperlipidemia, unspecified: Secondary | ICD-10-CM

## 2015-04-28 DIAGNOSIS — E119 Type 2 diabetes mellitus without complications: Secondary | ICD-10-CM

## 2015-04-28 MED ORDER — PRAVASTATIN SODIUM 20 MG PO TABS: 20.0000 mg | ORAL_TABLET | Freq: Every day | ORAL | Status: DC

## 2015-04-28 MED ORDER — INSULIN GLARGINE 100 UNIT/ML ~~LOC~~ SOLN: 30.0000 [IU] | Freq: Every day | SUBCUTANEOUS | Status: DC

## 2015-04-28 NOTE — Telephone Encounter (Signed)
Caller name: Self    Can be reached: [phone]  Pharmacy:  [facility], [place] - [address] [phone] (Phone) [phone] (Fax)         Reason for call: Request refill on pravastatin (PRAVACHOL) 20 MG tablet [id number] and insulin glargine (LANTUS) 100 UNIT/ML injection [id number]

## 2015-04-28 NOTE — Telephone Encounter (Signed)
Refill sent per [facility] refill protocol/SLS  

## 2015-05-06 ENCOUNTER — Telehealth: Payer: Self-pay | Admitting: *Deleted

## 2015-05-06 NOTE — Telephone Encounter (Signed)
Received fax request from [employer] for patient's Diabetic Testing supplies, will verify with patient prior to faxing; forwarded to provider for completion/SLS

## 2015-05-07 NOTE — Telephone Encounter (Signed)
Per patient request, order form was shredded; pt did not request from supplier/SLS

## 2015-05-09 ENCOUNTER — Other Ambulatory Visit: Payer: Self-pay | Admitting: Physician Assistant

## 2015-05-09 DIAGNOSIS — J449 Chronic obstructive pulmonary disease, unspecified: Secondary | ICD-10-CM

## 2015-05-09 MED ORDER — ALBUTEROL SULFATE HFA 108 (90 BASE) MCG/ACT IN AERS: 1.0000 | INHALATION_SPRAY | Freq: Four times a day (QID) | RESPIRATORY_TRACT | Status: DC | PRN

## 2015-05-20 ENCOUNTER — Telehealth: Payer: Self-pay

## 2015-05-20 DIAGNOSIS — G8929 Other chronic pain: Secondary | ICD-10-CM

## 2015-05-20 NOTE — Telephone Encounter (Signed)
Patient needs a refill for oxyCODONE (ROXICODONE) 15 MG immediate release tablet please notify when ready for pick up. She has 3 days left

## 2015-05-20 NOTE — Telephone Encounter (Signed)
Requesting Oxycodone 15mg -Take 1 tablet by mouth every 8 hours as needed for pain. Last refill:04/18/15;#90,0 Last OV:01/15/15 UDS:02/17/15-Moderate risk-Next screen:05/20/15 Please advise.//AB/CMA

## 2015-05-21 MED ORDER — OXYCODONE HCL 10 MG PO TABS: 10.0000 mg | ORAL_TABLET | Freq: Three times a day (TID) | ORAL | Status: DC | PRN

## 2015-05-21 NOTE — Telephone Encounter (Signed)
Called and spoke with the pt and informed her of the note below.  Pt verbalized understanding and agreed.  Pt stated that she has not gotten an appt with the pain specialist yet because they would not take her old insurance.  Now she has new insurance and the pain specialist is under the new insurance.  Pt stated that she will call the pain specialist to schedule an appt.//AB/CMA

## 2015-05-21 NOTE — Telephone Encounter (Signed)
Again we are weaning down on the medication until she sees pain specialist. New Rx Oxycodone 10 mg printed, signed and ready for pick up. She needs to schedule a follow-up before next refill.

## 2015-05-22 DIAGNOSIS — B182 Chronic viral hepatitis C: Secondary | ICD-10-CM | POA: Diagnosis not present

## 2015-05-27 ENCOUNTER — Other Ambulatory Visit: Payer: Self-pay | Admitting: *Deleted

## 2015-05-27 MED ORDER — LEVOTHYROXINE SODIUM 75 MCG PO TABS: 75.0000 ug | ORAL_TABLET | Freq: Every day | ORAL | Status: DC

## 2015-05-27 NOTE — Telephone Encounter (Signed)
Rx sent to the pharmacy by e-script.//AB/CMA 

## 2015-06-02 ENCOUNTER — Other Ambulatory Visit: Payer: Self-pay | Admitting: *Deleted

## 2015-06-02 DIAGNOSIS — Z794 Long term (current) use of insulin: Principal | ICD-10-CM

## 2015-06-02 DIAGNOSIS — E119 Type 2 diabetes mellitus without complications: Secondary | ICD-10-CM

## 2015-06-02 MED ORDER — INSULIN GLARGINE 100 UNIT/ML ~~LOC~~ SOLN: 30.0000 [IU] | Freq: Every day | SUBCUTANEOUS | Status: DC

## 2015-06-02 NOTE — Telephone Encounter (Signed)
Rx sent to the pharmacy by e-script.//AB/CMA 

## 2015-06-03 ENCOUNTER — Other Ambulatory Visit: Payer: Self-pay | Admitting: *Deleted

## 2015-06-03 MED ORDER — TIOTROPIUM BROMIDE MONOHYDRATE 18 MCG IN CAPS: 18.0000 ug | ORAL_CAPSULE | Freq: Every day | RESPIRATORY_TRACT | Status: DC

## 2015-06-03 NOTE — Telephone Encounter (Signed)
Rx for the Spiriva inhaler approved and sent to the pharmacy by e-script.  Rx for the Ambien has been refused.  Pt need to schedule an office visit.  Note sent to the pharmacy.//AB/CMA

## 2015-06-18 ENCOUNTER — Emergency Department (HOSPITAL_BASED_OUTPATIENT_CLINIC_OR_DEPARTMENT_OTHER): Payer: Medicare Other

## 2015-06-18 ENCOUNTER — Encounter: Payer: Self-pay | Admitting: Physician Assistant

## 2015-06-18 ENCOUNTER — Encounter (HOSPITAL_BASED_OUTPATIENT_CLINIC_OR_DEPARTMENT_OTHER): Payer: Self-pay | Admitting: Emergency Medicine

## 2015-06-18 ENCOUNTER — Emergency Department (HOSPITAL_BASED_OUTPATIENT_CLINIC_OR_DEPARTMENT_OTHER)
Admission: EM | Admit: 2015-06-18 | Discharge: 2015-06-18 | Disposition: A | Discharge status: Home / Self Care | Payer: Medicare Other | Attending: Emergency Medicine | Admitting: Emergency Medicine

## 2015-06-18 ENCOUNTER — Ambulatory Visit (INDEPENDENT_AMBULATORY_CARE_PROVIDER_SITE_OTHER): Payer: Medicare Other | Admitting: Physician Assistant

## 2015-06-18 VITALS — BP 144/90 | HR 107 | Temp 98.1°F | Ht 64.0 in | Wt 294.6 lb

## 2015-06-18 DIAGNOSIS — Z85528 Personal history of other malignant neoplasm of kidney: Secondary | ICD-10-CM | POA: Diagnosis not present

## 2015-06-18 DIAGNOSIS — Z7951 Long term (current) use of inhaled steroids: Secondary | ICD-10-CM | POA: Diagnosis not present

## 2015-06-18 DIAGNOSIS — Z8739 Personal history of other diseases of the musculoskeletal system and connective tissue: Secondary | ICD-10-CM | POA: Diagnosis not present

## 2015-06-18 DIAGNOSIS — B354 Tinea corporis: Secondary | ICD-10-CM

## 2015-06-18 DIAGNOSIS — Z8781 Personal history of (healed) traumatic fracture: Secondary | ICD-10-CM | POA: Insufficient documentation

## 2015-06-18 DIAGNOSIS — G8929 Other chronic pain: Secondary | ICD-10-CM | POA: Insufficient documentation

## 2015-06-18 DIAGNOSIS — E1140 Type 2 diabetes mellitus with diabetic neuropathy, unspecified: Secondary | ICD-10-CM | POA: Insufficient documentation

## 2015-06-18 DIAGNOSIS — Z794 Long term (current) use of insulin: Secondary | ICD-10-CM | POA: Insufficient documentation

## 2015-06-18 DIAGNOSIS — E039 Hypothyroidism, unspecified: Secondary | ICD-10-CM

## 2015-06-18 DIAGNOSIS — Z79899 Other long term (current) drug therapy: Secondary | ICD-10-CM | POA: Insufficient documentation

## 2015-06-18 DIAGNOSIS — Z8619 Personal history of other infectious and parasitic diseases: Secondary | ICD-10-CM | POA: Diagnosis not present

## 2015-06-18 DIAGNOSIS — J441 Chronic obstructive pulmonary disease with (acute) exacerbation: Secondary | ICD-10-CM | POA: Insufficient documentation

## 2015-06-18 DIAGNOSIS — I129 Hypertensive chronic kidney disease with stage 1 through stage 4 chronic kidney disease, or unspecified chronic kidney disease: Secondary | ICD-10-CM | POA: Diagnosis not present

## 2015-06-18 DIAGNOSIS — E7800 Pure hypercholesterolemia, unspecified: Secondary | ICD-10-CM | POA: Diagnosis not present

## 2015-06-18 DIAGNOSIS — G2581 Restless legs syndrome: Secondary | ICD-10-CM | POA: Insufficient documentation

## 2015-06-18 DIAGNOSIS — E119 Type 2 diabetes mellitus without complications: Secondary | ICD-10-CM | POA: Diagnosis not present

## 2015-06-18 DIAGNOSIS — E785 Hyperlipidemia, unspecified: Secondary | ICD-10-CM

## 2015-06-18 DIAGNOSIS — N183 Chronic kidney disease, stage 3 (moderate): Secondary | ICD-10-CM | POA: Insufficient documentation

## 2015-06-18 DIAGNOSIS — F329 Major depressive disorder, single episode, unspecified: Secondary | ICD-10-CM | POA: Diagnosis not present

## 2015-06-18 DIAGNOSIS — R0602 Shortness of breath: Secondary | ICD-10-CM | POA: Diagnosis not present

## 2015-06-18 DIAGNOSIS — Z7189 Other specified counseling: Secondary | ICD-10-CM | POA: Diagnosis not present

## 2015-06-18 DIAGNOSIS — Z87891 Personal history of nicotine dependence: Secondary | ICD-10-CM | POA: Diagnosis not present

## 2015-06-18 LAB — COMPREHENSIVE METABOLIC PANEL
ALT: 14 U/L (ref 14–54)
ANION GAP: 9 (ref 5–15)
AST: 18 U/L (ref 15–41)
Albumin: 3.9 g/dL (ref 3.5–5.0)
Alkaline Phosphatase: 69 U/L (ref 38–126)
BILIRUBIN TOTAL: 0.3 mg/dL (ref 0.3–1.2)
BUN: 19 mg/dL (ref 6–20)
CHLORIDE: 92 mmol/L — AB (ref 101–111)
CO2: 37 mmol/L — ABNORMAL HIGH (ref 22–32)
Calcium: 9.2 mg/dL (ref 8.9–10.3)
Creatinine, Ser: 0.99 mg/dL (ref 0.44–1.00)
Glucose, Bld: 127 mg/dL — ABNORMAL HIGH (ref 65–99)
POTASSIUM: 3.8 mmol/L (ref 3.5–5.1)
Sodium: 138 mmol/L (ref 135–145)
TOTAL PROTEIN: 7.9 g/dL (ref 6.5–8.1)

## 2015-06-18 LAB — CBC WITH DIFFERENTIAL/PLATELET
BASOS ABS: 0 10*3/uL (ref 0.0–0.1)
Basophils Relative: 0 %
EOS ABS: 0.1 10*3/uL (ref 0.0–0.7)
Eosinophils Relative: 1 %
HCT: 44.5 % (ref 36.0–46.0)
Hemoglobin: 13.2 g/dL (ref 12.0–15.0)
LYMPHS PCT: 17 %
Lymphs Abs: 1.3 10*3/uL (ref 0.7–4.0)
MCH: 25.8 pg — ABNORMAL LOW (ref 26.0–34.0)
MCHC: 29.7 g/dL — ABNORMAL LOW (ref 30.0–36.0)
MCV: 86.9 fL (ref 78.0–100.0)
Monocytes Absolute: 0.6 10*3/uL (ref 0.1–1.0)
Monocytes Relative: 8 %
Neutro Abs: 6 10*3/uL (ref 1.7–7.7)
Neutrophils Relative %: 75 %
PLATELETS: 83 10*3/uL — AB (ref 150–400)
RBC: 5.12 MIL/uL — AB (ref 3.87–5.11)
RDW: 17 % — ABNORMAL HIGH (ref 11.5–15.5)
WBC: 8 10*3/uL (ref 4.0–10.5)

## 2015-06-18 LAB — TROPONIN I

## 2015-06-18 MED ORDER — METHYLPREDNISOLONE SODIUM SUCC 125 MG IJ SOLR
125.0000 mg | Freq: Once | INTRAMUSCULAR | Status: AC
  Administered 2015-06-18: 125 mg via INTRAVENOUS
  Filled 2015-06-18: qty 2

## 2015-06-18 MED ORDER — NORTRIPTYLINE HCL 25 MG PO CAPS: 25.0000 mg | ORAL_CAPSULE | Freq: Every day | ORAL | Status: DC

## 2015-06-18 MED ORDER — ALBUTEROL (5 MG/ML) CONTINUOUS INHALATION SOLN
10.0000 mg/h | INHALATION_SOLUTION | Freq: Once | RESPIRATORY_TRACT | Status: AC
  Administered 2015-06-18: 10 mg/h via RESPIRATORY_TRACT

## 2015-06-18 MED ORDER — CLOTRIMAZOLE-BETAMETHASONE 1-0.05 % EX CREA: 1.0000 "application " | TOPICAL_CREAM | Freq: Two times a day (BID) | CUTANEOUS | Status: DC

## 2015-06-18 MED ORDER — NORTRIPTYLINE HCL 75 MG PO CAPS: 75.0000 mg | ORAL_CAPSULE | Freq: Every day | ORAL | Status: DC

## 2015-06-18 MED ORDER — ALBUTEROL (5 MG/ML) CONTINUOUS INHALATION SOLN
15.0000 mg/h | INHALATION_SOLUTION | RESPIRATORY_TRACT | Status: AC
  Administered 2015-06-18: 15:00:00 via RESPIRATORY_TRACT

## 2015-06-18 MED ORDER — OXYCODONE HCL 10 MG PO TABS: 10.0000 mg | ORAL_TABLET | Freq: Three times a day (TID) | ORAL | Status: DC | PRN

## 2015-06-18 MED ORDER — ALBUTEROL (5 MG/ML) CONTINUOUS INHALATION SOLN
INHALATION_SOLUTION | RESPIRATORY_TRACT | Status: AC
  Filled 2015-06-18: qty 20

## 2015-06-18 MED ORDER — DOXYCYCLINE HYCLATE 100 MG PO TABS
100.0000 mg | ORAL_TABLET | Freq: Once | ORAL | Status: AC
  Administered 2015-06-18: 100 mg via ORAL
  Filled 2015-06-18: qty 1

## 2015-06-18 MED ORDER — IPRATROPIUM BROMIDE 0.02 % IN SOLN
RESPIRATORY_TRACT | Status: AC
  Administered 2015-06-18: 0.5 mg via RESPIRATORY_TRACT
  Filled 2015-06-18: qty 2.5

## 2015-06-18 MED ORDER — ALBUTEROL SULFATE (2.5 MG/3ML) 0.083% IN NEBU: 2.5000 mg | INHALATION_SOLUTION | Freq: Four times a day (QID) | RESPIRATORY_TRACT | Status: DC | PRN

## 2015-06-18 MED ORDER — ALBUTEROL SULFATE HFA 108 (90 BASE) MCG/ACT IN AERS: 1.0000 | INHALATION_SPRAY | Freq: Four times a day (QID) | RESPIRATORY_TRACT | Status: DC | PRN

## 2015-06-18 MED ORDER — PREDNISONE 20 MG PO TABS: 20.0000 mg | ORAL_TABLET | Freq: Two times a day (BID) | ORAL | Status: DC

## 2015-06-18 MED ORDER — DOXYCYCLINE HYCLATE 100 MG PO CAPS: 100.0000 mg | ORAL_CAPSULE | Freq: Two times a day (BID) | ORAL | Status: DC

## 2015-06-18 MED ORDER — ALBUTEROL SULFATE (2.5 MG/3ML) 0.083% IN NEBU
2.5000 mg | INHALATION_SOLUTION | Freq: Once | RESPIRATORY_TRACT | Status: AC
  Administered 2015-06-18: 2.5 mg via RESPIRATORY_TRACT

## 2015-06-18 MED ORDER — IPRATROPIUM BROMIDE 0.02 % IN SOLN
0.5000 mg | Freq: Once | RESPIRATORY_TRACT | Status: AC
  Administered 2015-06-18: 0.5 mg via RESPIRATORY_TRACT

## 2015-06-18 NOTE — ED Notes (Signed)
Patient states that she was at her Dr's for a routine visit. Reports that she had a RA sat of 70 in office, Breathing treatment given and she was 84%. Patient states that she has become progressively more SOB with walking and talking. Patient on o2 at home at night

## 2015-06-18 NOTE — ED Notes (Signed)
Patient placed on cardiac monitor with vitals set for every 30 minutes.

## 2015-06-18 NOTE — ED Provider Notes (Signed)
CSN: [id number]     Arrival date & time 06/18/15  1442 History   First MD Initiated Contact with Patient 06/18/15 [phone]     Chief Complaint  Patient presents with  . Shortness of Breath      HPI  Patient presents for evaluation of difficulty breathing. Has history of COPD.[name] Kitchen Sent home O2. 6 she quit smoking last August was able to stop her home O2. Came for routine clinic appointment today was noted to be hypoxemic down to 70. She states this was shortly after she walked in the door. She states that she is "always low when I come in but he gets better. After nebulized albuterol treatment she was still in the mid 80s and referred here from a clinic appointment upstairs. Denies chest pain, fever, or surgical symptoms, increase in edema. No PND or orthopnea.  Past Medical History  Diagnosis Date  . Diabetes ([place])     Type II  . Neuropathy ([place])     Diabetes  . COPD (chronic obstructive pulmonary disease) ([place])   . Renal cancer ([place])     Left Kidney Removed  . Fibromyalgia   . Hepatitis C   . Abdominal mass of other site   . Chronic kidney disease, stage 3     Borderline Stage 2-3  . Depression   . Cervical compression fracture ([place])   . Hypercholesteremia   . Syncope   . Osteoarthritis   . Hypertension   . Hypothyroidism   . Chronic lower limb pain   . Morbid obesity ([place])   . CTS (carpal tunnel syndrome)   . RLS (restless legs syndrome)   . Venous stasis   . OSA (obstructive sleep apnea)    Past Surgical History  Procedure Laterality Date  . Nephrectomy      Left  . Umbilical hernia repair    . Abdominal hernia repair      x2  . Incisional hernia repair    . Abdominal hysterectomy    . Bladder suspension    . Vagina surgery    . Cholecystectomy    . Wisdom tooth extraction    . Tooth extraction    . Tonsillectomy    . Knee arthroscopy      Bilateral  . Cyst excision      Head   Family History  Problem Relation Age of Onset  . Heart attack Mother 9     Deceased  . Heart disease Mother   . Emphysema Mother   . COPD Father 28    Deceased  . Emphysema Father   . Alcoholism Father   . Alcoholism Mother   . Esophageal varices Father   . Alcoholism Paternal Grandfather   . Diabetes Maternal Grandmother   . Heart disease Maternal Grandmother   . Lung cancer Maternal Grandfather   . Emphysema Maternal Grandfather   . Brain cancer Maternal Aunt   . Diabetes Sister   . Heart defect Sister   . Obesity Son   . Breast cancer Maternal Aunt    Social History  Substance Use Topics  . Smoking status: Former Smoker -- 2.50 packs/day for 45 years    Types: Cigarettes    Quit date: 09/25/2013  . Smokeless tobacco: Never Used  . Alcohol Use: No   OB History    No data available     Review of Systems  Constitutional: Negative for fever, chills, diaphoresis, appetite change and fatigue.  HENT: Negative for mouth sores, sore throat  and trouble swallowing.   Eyes: Negative for visual disturbance.  Respiratory: Positive for cough, shortness of breath and wheezing. Negative for chest tightness.   Cardiovascular: Negative for chest pain.  Gastrointestinal: Negative for nausea, vomiting, abdominal pain, diarrhea and abdominal distention.  Endocrine: Negative for polydipsia, polyphagia and polyuria.  Genitourinary: Negative for dysuria, frequency and hematuria.  Musculoskeletal: Negative for gait problem.  Skin: Negative for color change, pallor and rash.  Neurological: Negative for dizziness, syncope, light-headedness and headaches.  Hematological: Does not bruise/bleed easily.  Psychiatric/Behavioral: Negative for behavioral problems and confusion.      Allergies  Gabapentin; Lyrica; Ketorolac tromethamine; and Lisinopril  Home Medications   Prior to Admission medications   Medication Sig Start Date End Date Taking? Authorizing Provider  albuterol (PROVENTIL HFA;VENTOLIN HFA) 108 (90 Base) MCG/ACT inhaler Inhale 1-2 puffs into the  lungs every 6 (six) hours as needed for wheezing. 06/18/15   Tanna Furry, MD  albuterol (PROVENTIL) (2.5 MG/3ML) 0.083% nebulizer solution Take 3 mLs (2.5 mg total) by nebulization every 6 (six) hours as needed for wheezing or shortness of breath. 06/18/15   Tanna Furry, MD  Bisacodyl (LAXATIVE PO) Take by mouth as needed.    Historical Provider, MD  clotrimazole-betamethasone (LOTRISONE) cream Apply 1 application topically 2 (two) times daily. 06/18/15   [name], PA-C  doxycycline (VIBRAMYCIN) 100 MG capsule Take 1 capsule (100 mg total) by mouth 2 (two) times daily. 06/18/15   Tanna Furry, MD  Fluticasone-Salmeterol (ADVAIR DISKUS) 500-50 MCG/DOSE AEPB Inhale 1 puff into the lungs once. 03/21/15   [name], PA-C  furosemide (LASIX) 20 MG tablet Take 1 tablet (20 mg total) by mouth daily as needed. 12/10/14   [name], PA-C  insulin glargine (LANTUS) 100 UNIT/ML injection Inject 0.3 mLs (30 Units total) into the skin daily. 06/02/15   [name], PA-C  insulin lispro (HUMALOG) 100 UNIT/ML injection Inject 0.15 mLs (15 Units total) into the skin 3 (three) times daily before meals. 02/07/15   [name], PA-C  Insulin Syringe-Needle U-100 30G X 1/2" 1 ML MISC Use 4 times daily as directed. 04/30/14   [name], PA-C  levothyroxine (SYNTHROID, LEVOTHROID) 75 MCG tablet Take 1 tablet (75 mcg total) by mouth daily before breakfast. 05/27/15   [name], PA-C  nortriptyline (PAMELOR) 25 MG capsule Take 1 capsule (25 mg total) by mouth at bedtime. 06/18/15   [name], PA-C  nortriptyline (PAMELOR) 75 MG capsule Take 1 capsule (75 mg total) by mouth at bedtime. 06/18/15   [name], PA-C  nystatin (MYCOSTATIN/NYSTOP) 100000 UNIT/GM POWD Apply topically.    Historical Provider, MD  Oxycodone HCl 10 MG TABS Take 1 tablet (10 mg total) by mouth every 8 (eight) hours as needed. 06/18/15   [name], PA-C  polycarbophil (FIBERCON) 625 MG tablet Take 625 mg by  mouth daily.    Historical Provider, MD  pravastatin (PRAVACHOL) 20 MG tablet Take 1 tablet (20 mg total) by mouth daily. 04/28/15   [name], PA-C  predniSONE (DELTASONE) 20 MG tablet Take 1 tablet (20 mg total) by mouth 2 (two) times daily with a meal. 06/18/15   Tanna Furry, MD  sitaGLIPtin (JANUVIA) 50 MG tablet Take 1 tablet (50 mg total) by mouth daily. 10/07/14   [name], PA-C  tiotropium (SPIRIVA) 18 MCG inhalation capsule Place 1 capsule (18 mcg total) into inhaler and inhale daily. 06/03/15   [name],  PA-C   BP 162/93 mmHg  Pulse 110  Temp(Src) 98.1 F (36.7 C) (Oral)  Resp 16  Ht 5\' 3"  (1.6 m)  Wt 294 lb (133.358 kg)  BMI 52.09 kg/m2  SpO2 95% Physical Exam  Constitutional: She is oriented to person, place, and time. She appears well-developed and well-nourished. No distress.  HENT:  Head: Normocephalic.  Eyes: Conjunctivae are normal. Pupils are equal, round, and reactive to light. No scleral icterus.  Neck: Normal range of motion. Neck supple. No thyromegaly present.  Cardiovascular: Normal rate and regular rhythm.  Exam reveals no gallop and no friction rub.   No murmur heard. Pulmonary/Chest: Effort normal. No respiratory distress. She has wheezes in the right upper field, the right middle field, the right lower field, the left upper field, the left middle field and the left lower field. She has no rales.  Abdominal: Soft. Bowel sounds are normal. She exhibits no distension. There is no tenderness. There is no rebound.  Musculoskeletal: Normal range of motion.  Neurological: She is alert and oriented to person, place, and time.  Skin: Skin is warm and dry. No rash noted.  Psychiatric: She has a normal mood and affect. Her behavior is normal.    ED Course  Procedures (including critical care time) Labs Review Labs Reviewed  CBC WITH DIFFERENTIAL/PLATELET - Abnormal; Notable for the following:    RBC 5.12 (*)    MCH 25.8 (*)    MCHC 29.7 (*)     RDW 17.0 (*)    Platelets 83 (*)    All other components within normal limits  COMPREHENSIVE METABOLIC PANEL - Abnormal; Notable for the following:    Chloride 92 (*)    CO2 37 (*)    Glucose, Bld 127 (*)    All other components within normal limits  TROPONIN I    Imaging Review Dg Chest 2 View  06/18/2015  CLINICAL DATA:  Shortness of breath. EXAM: CHEST  2 VIEW COMPARISON:  Radiograph of February 14, 2014. CT scan of June 20, 2013. FINDINGS: The heart size and mediastinal contours are within normal limits. Both lungs are clear. No pneumothorax or pleural effusion is noted. Stable left posterior basilar opacity is noted consistent with fatty diaphragmatic hernia as seen on prior CT. The visualized skeletal structures are unremarkable. IMPRESSION: No active cardiopulmonary disease. Electronically Signed   By: Marijo Conception, M.D.   On: 06/18/2015 16:28   I have personally reviewed and evaluated these images and lab results as part of my medical decision-making.   EKG Interpretation   Date/Time:  Wednesday June 18 2015 14:57:38 EST Ventricular Rate:  108 PR Interval:  197 QRS Duration: 103 QT Interval:  327 QTC Calculation: 438 R Axis:   102 Text Interpretation:  Sinus tachycardia Atrial premature complex Right  axis deviation Low voltage, precordial leads Anteroseptal infarct, old  Confirmed by Jeneen Rinks  MD, [place] ([id number]) on 06/18/2015 3:24:11 PM      MDM   Final diagnoses:  COPD exacerbation ([place])    After 2 treatments. Patient was very adamant that she wanted to go home. Her lungs show very faint neck for a wheeze. She is saturating 97% on her 1.5 L nasal cannula. She has been on home O2 before. Think she is appropriate for discharge home with continued home O2, steroids, mucolytic, and buttocks, bronchodilators, close primary care follow-up.    Tanna Furry, MD 06/18/15 2352

## 2015-06-18 NOTE — ED Notes (Signed)
Patient transported to X-ray 

## 2015-06-18 NOTE — Discharge Instructions (Signed)

## 2015-06-18 NOTE — Progress Notes (Signed)
Patient presents to clinic today for follow-up of COPD, Hypothyroidism, Chronic Pain and DM II previously controlled with renal manifestations.  Hypothyroidism -- Is taking levothyroxine as directed. Denies change in energy levels or bowel habits. Is due for repeat TSH.  COPD -- Categorized in EMR as GOLD III. Endorses taking medications as directed. In on nocturnal O2 at 2L/min. Endorses some worsening of baseline symptoms over the past few weeks. Endorses increased productive cough with change in characteristics. Denies fever or chilld. Denies chest pain. Notes SOB above baseline.  Chronic Pain -- Multiple issues. Followed by Pain Management. Is taking Pamelor and Oxycodone as directed. Has upcoming follow-up with Dr. Gerarda Fraction.  DM -- with chronic kidney disease. Endorses taking 30 units of Lantus daily. Humalog only PRN following scale. Also taking Januvia. Is due for repeat lab work. Endorses fasting sugars averaging 100-110. Denies decreased urinary output or dark urine.  Past Medical History  Diagnosis Date  . Diabetes ([place])     Type II  . Neuropathy ([place])     Diabetes  . COPD (chronic obstructive pulmonary disease) ([place])   . Renal cancer ([place])     Left Kidney Removed  . Fibromyalgia   . Hepatitis C   . Abdominal mass of other site   . Chronic kidney disease, stage 3     Borderline Stage 2-3  . Depression   . Cervical compression fracture ([place])   . Hypercholesteremia   . Syncope   . Osteoarthritis   . Hypertension   . Hypothyroidism   . Chronic lower limb pain   . Morbid obesity ([place])   . CTS (carpal tunnel syndrome)   . RLS (restless legs syndrome)   . Venous stasis   . OSA (obstructive sleep apnea)     Current Outpatient Prescriptions on File Prior to Visit  Medication Sig Dispense Refill  . Bisacodyl (LAXATIVE PO) Take by mouth as needed.    . Fluticasone-Salmeterol (ADVAIR DISKUS) 500-50 MCG/DOSE AEPB Inhale 1 puff into the lungs once. 60 each 5  . furosemide  (LASIX) 20 MG tablet Take 1 tablet (20 mg total) by mouth daily as needed. 30 tablet 0  . insulin glargine (LANTUS) 100 UNIT/ML injection Inject 0.3 mLs (30 Units total) into the skin daily. 10 mL 0  . insulin lispro (HUMALOG) 100 UNIT/ML injection Inject 0.15 mLs (15 Units total) into the skin 3 (three) times daily before meals. 10 mL 5  . Insulin Syringe-Needle U-100 30G X 1/2" 1 ML MISC Use 4 times daily as directed. 100 each 3  . levothyroxine (SYNTHROID, LEVOTHROID) 75 MCG tablet Take 1 tablet (75 mcg total) by mouth daily before breakfast. 90 tablet 1  . nystatin (MYCOSTATIN/NYSTOP) 100000 UNIT/GM POWD Apply topically.    . polycarbophil (FIBERCON) 625 MG tablet Take 625 mg by mouth daily.    . pravastatin (PRAVACHOL) 20 MG tablet Take 1 tablet (20 mg total) by mouth daily. 30 tablet 2  . sitaGLIPtin (JANUVIA) 50 MG tablet Take 1 tablet (50 mg total) by mouth daily. 90 tablet 1  . tiotropium (SPIRIVA) 18 MCG inhalation capsule Place 1 capsule (18 mcg total) into inhaler and inhale daily. 30 capsule 0   No current facility-administered medications on file prior to visit.    Allergies  Allergen Reactions  . Gabapentin Anaphylaxis  . Lyrica [Pregabalin] Shortness Of Breath    Trouble breathing  . Ketorolac Tromethamine Hives  . Lisinopril Cough    Family History  Problem Relation Age of Onset  .  Heart attack Mother 1    Deceased  . Heart disease Mother   . Emphysema Mother   . COPD Father 33    Deceased  . Emphysema Father   . Alcoholism Father   . Alcoholism Mother   . Esophageal varices Father   . Alcoholism Paternal Grandfather   . Diabetes Maternal Grandmother   . Heart disease Maternal Grandmother   . Lung cancer Maternal Grandfather   . Emphysema Maternal Grandfather   . Brain cancer Maternal Aunt   . Diabetes Sister   . Heart defect Sister   . Obesity Son   . Breast cancer Maternal Aunt     Social History   Social History  . Marital Status: Single     Spouse Name: N/A  . Number of Children: N/A  . Years of Education: N/A   Social History Main Topics  . Smoking status: Former Smoker -- 2.50 packs/day for 45 years    Types: Cigarettes    Quit date: 09/25/2013  . Smokeless tobacco: Never Used  . Alcohol Use: No  . Drug Use: No  . Sexual Activity: No   Other Topics Concern  . None   Social History Narrative   Review of Systems - See HPI.  All other ROS are negative.  BP 144/90 mmHg  Pulse 107  Temp(Src) 98.1 F (36.7 C) (Oral)  Ht 5\' 4"  (1.626 m)  Wt 294 lb 9.6 oz (133.63 kg)  BMI 50.54 kg/m2  SpO2 82%  Physical Exam  Constitutional: She is oriented to person, place, and time and well-developed, well-nourished, and in no distress.  HENT:  Head: Normocephalic and atraumatic.  Eyes: Conjunctivae are normal.  Neck: Neck supple.  Cardiovascular: Normal rate, regular rhythm, normal heart sounds and intact distal pulses.   Pulmonary/Chest: Effort normal. No respiratory distress. She has wheezes. She has no rales. She exhibits no tenderness.  Neurological: She is alert and oriented to person, place, and time.  Skin: Skin is warm and dry. No rash noted.  Psychiatric: Affect normal.  Vitals reviewed.   Recent Results (from the past 2160 hour(s))  CBC with Differential     Status: Abnormal   Collection Time: 06/18/15  3:00 PM  Result Value Ref Range   WBC 8.0 4.0 - 10.5 K/uL   RBC 5.12 (H) 3.87 - 5.11 MIL/uL   Hemoglobin 13.2 12.0 - 15.0 g/dL   HCT 08/16/15 36.1 - 49.2 %   MCV 86.9 78.0 - 100.0 fL   MCH 25.8 (L) 26.0 - 34.0 pg   MCHC 29.7 (L) 30.0 - 36.0 g/dL   RDW 98.8 (H) 21.3 - 65.3 %   Platelets 83 (L) 150 - 400 K/uL    Comment: PLATELET COUNT CONFIRMED BY SMEAR SPECIMEN CHECKED FOR CLOTS REPEATED TO VERIFY    Neutrophils Relative % 75 %   Neutro Abs 6.0 1.7 - 7.7 K/uL   Lymphocytes Relative 17 %   Lymphs Abs 1.3 0.7 - 4.0 K/uL   Monocytes Relative 8 %   Monocytes Absolute 0.6 0.1 - 1.0 K/uL   Eosinophils  Relative 1 %   Eosinophils Absolute 0.1 0.0 - 0.7 K/uL   Basophils Relative 0 %   Basophils Absolute 0.0 0.0 - 0.1 K/uL  Comprehensive metabolic panel     Status: Abnormal   Collection Time: 06/18/15  3:00 PM  Result Value Ref Range   Sodium 138 135 - 145 mmol/L   Potassium 3.8 3.5 - 5.1 mmol/L   Chloride 92 (L)  101 - 111 mmol/L   CO2 37 (H) 22 - 32 mmol/L   Glucose, Bld 127 (H) 65 - 99 mg/dL   BUN 19 6 - 20 mg/dL   Creatinine, Ser 0.99 0.44 - 1.00 mg/dL   Calcium 9.2 8.9 - 10.3 mg/dL   Total Protein 7.9 6.5 - 8.1 g/dL   Albumin 3.9 3.5 - 5.0 g/dL   AST 18 15 - 41 U/L   ALT 14 14 - 54 U/L   Alkaline Phosphatase 69 38 - 126 U/L   Total Bilirubin 0.3 0.3 - 1.2 mg/dL   GFR calc non Af Amer >60 >60 mL/min   GFR calc Af Amer >60 >60 mL/min    Comment: (NOTE) The eGFR has been calculated using the CKD EPI equation. This calculation has not been validated in all clinical situations. eGFR's persistently <60 mL/min signify possible Chronic Kidney Disease.    Anion gap 9 5 - 15  Troponin I     Status: None   Collection Time: 06/18/15  3:00 PM  Result Value Ref Range   Troponin I <0.03 <0.031 ng/mL    Comment:        NO INDICATION OF MYOCARDIAL INJURY.     Assessment/Plan: COPD exacerbation ([place]) Despite 2 albuterol nebulizers, unable to get O2 sats above 84% on RA. Needs further assessment. Patient triaged to ER for more acute assessment. Will likely need daily home O2.  Diabetes mellitus, type II, insulin dependent Plan to repeat labs today. Patient was triaged to ER for COPD exacerbation. She will return to have labs. Continue current regimen.  Encounter for chronic pain management Medications refilled until she has follow-up with Pain Management. They will be taking over medications soon.  Thyroid activity decreased Will obtain repeat TSH today.

## 2015-06-18 NOTE — Progress Notes (Signed)
Pre visit review using our clinic review tool, if applicable. No additional management support is needed unless otherwise documented below in the visit note. 

## 2015-06-23 DIAGNOSIS — E039 Hypothyroidism, unspecified: Secondary | ICD-10-CM | POA: Insufficient documentation

## 2015-06-23 NOTE — Assessment & Plan Note (Signed)
Will obtain repeat TSH today. 

## 2015-06-23 NOTE — Assessment & Plan Note (Signed)
Plan to repeat labs today. Patient was triaged to ER for COPD exacerbation. She will return to have labs. Continue current regimen.

## 2015-06-23 NOTE — Assessment & Plan Note (Signed)
Despite 2 albuterol nebulizers, unable to get O2 sats above 84% on RA. Needs further assessment. Patient triaged to ER for more acute assessment. Will likely need daily home O2.

## 2015-06-23 NOTE — Assessment & Plan Note (Signed)
Medications refilled until she has follow-up with Pain Management. They will be taking over medications soon.

## 2015-06-25 ENCOUNTER — Encounter (HOSPITAL_BASED_OUTPATIENT_CLINIC_OR_DEPARTMENT_OTHER): Payer: Self-pay | Admitting: *Deleted

## 2015-06-25 ENCOUNTER — Emergency Department (HOSPITAL_BASED_OUTPATIENT_CLINIC_OR_DEPARTMENT_OTHER)
Admission: EM | Admit: 2015-06-25 | Discharge: 2015-06-25 | Disposition: A | Discharge status: Home / Self Care | Payer: Medicare Other | Attending: Emergency Medicine | Admitting: Emergency Medicine

## 2015-06-25 DIAGNOSIS — J441 Chronic obstructive pulmonary disease with (acute) exacerbation: Secondary | ICD-10-CM | POA: Diagnosis not present

## 2015-06-25 DIAGNOSIS — Y9289 Other specified places as the place of occurrence of the external cause: Secondary | ICD-10-CM | POA: Diagnosis not present

## 2015-06-25 DIAGNOSIS — Y998 Other external cause status: Secondary | ICD-10-CM | POA: Diagnosis not present

## 2015-06-25 DIAGNOSIS — Z7952 Long term (current) use of systemic steroids: Secondary | ICD-10-CM | POA: Insufficient documentation

## 2015-06-25 DIAGNOSIS — E039 Hypothyroidism, unspecified: Secondary | ICD-10-CM | POA: Diagnosis not present

## 2015-06-25 DIAGNOSIS — Z8781 Personal history of (healed) traumatic fracture: Secondary | ICD-10-CM | POA: Diagnosis not present

## 2015-06-25 DIAGNOSIS — S61219A Laceration without foreign body of unspecified finger without damage to nail, initial encounter: Secondary | ICD-10-CM

## 2015-06-25 DIAGNOSIS — Z8739 Personal history of other diseases of the musculoskeletal system and connective tissue: Secondary | ICD-10-CM | POA: Insufficient documentation

## 2015-06-25 DIAGNOSIS — Z87891 Personal history of nicotine dependence: Secondary | ICD-10-CM | POA: Diagnosis not present

## 2015-06-25 DIAGNOSIS — Z85528 Personal history of other malignant neoplasm of kidney: Secondary | ICD-10-CM | POA: Insufficient documentation

## 2015-06-25 DIAGNOSIS — N183 Chronic kidney disease, stage 3 (moderate): Secondary | ICD-10-CM | POA: Diagnosis not present

## 2015-06-25 DIAGNOSIS — Z794 Long term (current) use of insulin: Secondary | ICD-10-CM | POA: Insufficient documentation

## 2015-06-25 DIAGNOSIS — Y9389 Activity, other specified: Secondary | ICD-10-CM | POA: Insufficient documentation

## 2015-06-25 DIAGNOSIS — S61211A Laceration without foreign body of left index finger without damage to nail, initial encounter: Secondary | ICD-10-CM | POA: Diagnosis not present

## 2015-06-25 DIAGNOSIS — Z792 Long term (current) use of antibiotics: Secondary | ICD-10-CM | POA: Insufficient documentation

## 2015-06-25 DIAGNOSIS — I129 Hypertensive chronic kidney disease with stage 1 through stage 4 chronic kidney disease, or unspecified chronic kidney disease: Secondary | ICD-10-CM | POA: Diagnosis not present

## 2015-06-25 DIAGNOSIS — E1140 Type 2 diabetes mellitus with diabetic neuropathy, unspecified: Secondary | ICD-10-CM | POA: Insufficient documentation

## 2015-06-25 DIAGNOSIS — F329 Major depressive disorder, single episode, unspecified: Secondary | ICD-10-CM | POA: Insufficient documentation

## 2015-06-25 DIAGNOSIS — Z8619 Personal history of other infectious and parasitic diseases: Secondary | ICD-10-CM | POA: Insufficient documentation

## 2015-06-25 DIAGNOSIS — G8929 Other chronic pain: Secondary | ICD-10-CM | POA: Diagnosis not present

## 2015-06-25 DIAGNOSIS — Z79899 Other long term (current) drug therapy: Secondary | ICD-10-CM | POA: Diagnosis not present

## 2015-06-25 DIAGNOSIS — W260XXA Contact with knife, initial encounter: Secondary | ICD-10-CM | POA: Diagnosis not present

## 2015-06-25 DIAGNOSIS — Z7984 Long term (current) use of oral hypoglycemic drugs: Secondary | ICD-10-CM | POA: Diagnosis not present

## 2015-06-25 DIAGNOSIS — E7800 Pure hypercholesterolemia, unspecified: Secondary | ICD-10-CM | POA: Diagnosis not present

## 2015-06-25 DIAGNOSIS — S6992XA Unspecified injury of left wrist, hand and finger(s), initial encounter: Secondary | ICD-10-CM | POA: Diagnosis present

## 2015-06-25 MED ORDER — BACITRACIN ZINC 500 UNIT/GM EX OINT
1.0000 "application " | TOPICAL_OINTMENT | Freq: Two times a day (BID) | CUTANEOUS | Status: DC
  Administered 2015-06-25: 1 via TOPICAL

## 2015-06-25 NOTE — ED Notes (Signed)
Pt cut left ring finger on a kitchen knife last night.

## 2015-06-25 NOTE — ED Provider Notes (Signed)
CSN: [id number]     Arrival date & time 06/25/15  0944 History   First MD Initiated Contact with Patient 06/25/15 1010     Chief Complaint  Patient presents with  . Extremity Laceration     (Consider location/radiation/quality/duration/timing/severity/associated sxs/prior Treatment) The history is provided by the patient.     Renee Noble is a 60 year-old female who presents to the emergency room this morning for evaluation of a laceration to her left ring finger. She states that she cut her finger last night on a brand-new, sharp kitchen knife that she just washed.  She denies being on blood thinners was stated he continued to slowly bleed through multiple towels, she attempted to hold pressure, she applied a tight bandage and she elevated it above her head. This morning she continued to have small amount of bleeding so she presented to the emergency department for evaluation. When she took off her Band-Aid, a small flap of skin was removed with that leaving less than a centimeter area of skin on the distal finger fat pad exposed. She states that she had a tetanus updated 3 years ago.  She denies color change, numbness, tingling, weakness.  She was also seen recently in the ER because of hypoxia at her doctor's office, approximately one week ago. She states that her shortness of breath and wheezes continue to improve and she has 3 days remaining steroid. Her doctor's offices upstairs recurrently aren't she is going there after this visit to follow-up on her home oxygen take. She denies shortness of breath, chest pain, worsening exertional dyspnea.  She denies headache abdominal pain visual disturbances  Past Medical History  Diagnosis Date  . Diabetes ([place])     Type II  . Neuropathy ([place])     Diabetes  . COPD (chronic obstructive pulmonary disease) ([place])   . Renal cancer ([place])     Left Kidney Removed  . Fibromyalgia   . Hepatitis C   . Abdominal mass of other site   . Chronic kidney  disease, stage 3     Borderline Stage 2-3  . Depression   . Cervical compression fracture ([place])   . Hypercholesteremia   . Syncope   . Osteoarthritis   . Hypertension   . Hypothyroidism   . Chronic lower limb pain   . Morbid obesity ([place])   . CTS (carpal tunnel syndrome)   . RLS (restless legs syndrome)   . Venous stasis   . OSA (obstructive sleep apnea)    Past Surgical History  Procedure Laterality Date  . Nephrectomy      Left  . Umbilical hernia repair    . Abdominal hernia repair      x2  . Incisional hernia repair    . Abdominal hysterectomy    . Bladder suspension    . Vagina surgery    . Cholecystectomy    . Wisdom tooth extraction    . Tooth extraction    . Tonsillectomy    . Knee arthroscopy      Bilateral  . Cyst excision      Head   Family History  Problem Relation Age of Onset  . Heart attack Mother 73    Deceased  . Heart disease Mother   . Emphysema Mother   . COPD Father 52    Deceased  . Emphysema Father   . Alcoholism Father   . Alcoholism Mother   . Esophageal varices Father   . Alcoholism Paternal Grandfather   .  Diabetes Maternal Grandmother   . Heart disease Maternal Grandmother   . Lung cancer Maternal Grandfather   . Emphysema Maternal Grandfather   . Brain cancer Maternal Aunt   . Diabetes Sister   . Heart defect Sister   . Obesity Son   . Breast cancer Maternal Aunt    Social History  Substance Use Topics  . Smoking status: Former Smoker -- 2.50 packs/day for 45 years    Types: Cigarettes    Quit date: 09/25/2013  . Smokeless tobacco: Never Used  . Alcohol Use: No   OB History    No data available     Review of Systems  Constitutional: Negative.   HENT: Negative.   Respiratory: Positive for wheezing. Negative for cough.   Cardiovascular: Negative.   Gastrointestinal: Negative.   Genitourinary: Negative.   Musculoskeletal: Negative.  Negative for joint swelling and arthralgias.  Skin: Negative.  Negative for color  change and pallor.  Neurological: Negative.  Negative for weakness, light-headedness and numbness.  Hematological: Negative.   Psychiatric/Behavioral: Negative.   All other systems reviewed and are negative.     Allergies  Gabapentin; Lyrica; Ketorolac tromethamine; and Lisinopril  Home Medications   Prior to Admission medications   Medication Sig Start Date End Date Taking? Authorizing Provider  albuterol (PROVENTIL HFA;VENTOLIN HFA) 108 (90 Base) MCG/ACT inhaler Inhale 1-2 puffs into the lungs every 6 (six) hours as needed for wheezing. 06/18/15   Tanna Furry, MD  albuterol (PROVENTIL) (2.5 MG/3ML) 0.083% nebulizer solution Take 3 mLs (2.5 mg total) by nebulization every 6 (six) hours as needed for wheezing or shortness of breath. 06/18/15   Tanna Furry, MD  Bisacodyl (LAXATIVE PO) Take by mouth as needed.    Historical Provider, MD  clotrimazole-betamethasone (LOTRISONE) cream Apply 1 application topically 2 (two) times daily. 06/18/15   [name], PA-C  doxycycline (VIBRAMYCIN) 100 MG capsule Take 1 capsule (100 mg total) by mouth 2 (two) times daily. 06/18/15   Tanna Furry, MD  Fluticasone-Salmeterol (ADVAIR DISKUS) 500-50 MCG/DOSE AEPB Inhale 1 puff into the lungs once. 03/21/15   [name], PA-C  furosemide (LASIX) 20 MG tablet Take 1 tablet (20 mg total) by mouth daily as needed. 12/10/14   [name], PA-C  insulin glargine (LANTUS) 100 UNIT/ML injection Inject 0.3 mLs (30 Units total) into the skin daily. 06/02/15   [name], PA-C  insulin lispro (HUMALOG) 100 UNIT/ML injection Inject 0.15 mLs (15 Units total) into the skin 3 (three) times daily before meals. 02/07/15   [name], PA-C  Insulin Syringe-Needle U-100 30G X 1/2" 1 ML MISC Use 4 times daily as directed. 04/30/14   [name], PA-C  levothyroxine (SYNTHROID, LEVOTHROID) 75 MCG tablet Take 1 tablet (75 mcg total) by mouth daily before breakfast. 05/27/15   [name], PA-C  nortriptyline  (PAMELOR) 25 MG capsule Take 1 capsule (25 mg total) by mouth at bedtime. 06/18/15   [name], PA-C  nortriptyline (PAMELOR) 75 MG capsule Take 1 capsule (75 mg total) by mouth at bedtime. 06/18/15   [name], PA-C  nystatin (MYCOSTATIN/NYSTOP) 100000 UNIT/GM POWD Apply topically.    Historical Provider, MD  Oxycodone HCl 10 MG TABS Take 1 tablet (10 mg total) by mouth every 8 (eight) hours as needed. 06/18/15   [name], PA-C  polycarbophil (FIBERCON) 625 MG tablet Take 625 mg by mouth daily.    Historical Provider, MD  pravastatin (PRAVACHOL) 20 MG  tablet Take 1 tablet (20 mg total) by mouth daily. 04/28/15   [name], PA-C  predniSONE (DELTASONE) 20 MG tablet Take 1 tablet (20 mg total) by mouth 2 (two) times daily with a meal. 06/18/15   Tanna Furry, MD  sitaGLIPtin (JANUVIA) 50 MG tablet Take 1 tablet (50 mg total) by mouth daily. 10/07/14   [name], PA-C  tiotropium (SPIRIVA) 18 MCG inhalation capsule Place 1 capsule (18 mcg total) into inhaler and inhale daily. 06/03/15   [name], PA-C   BP 152/83 mmHg  Pulse 95  Temp(Src) 98.2 F (36.8 C) (Oral)  Resp 20  Ht 5\' 3"  (1.6 m)  Wt 133.811 kg  BMI 52.27 kg/m2  SpO2 98% Physical Exam  Constitutional: She is oriented to person, place, and time. She appears well-developed and well-nourished. No distress.  HENT:  Head: Normocephalic and atraumatic.  Right Ear: External ear normal.  Left Ear: External ear normal.  Nose: Nose normal.  Mouth/Throat: Oropharynx is clear and moist. No oropharyngeal exudate.  Eyes: Conjunctivae and EOM are normal. Pupils are equal, round, and reactive to light. Right eye exhibits no discharge. Left eye exhibits no discharge. No scleral icterus.  Neck: Normal range of motion. Neck supple. No JVD present. No tracheal deviation present.  Cardiovascular: Normal rate, regular rhythm and intact distal pulses.   Pulmonary/Chest: Effort normal. No stridor. No respiratory distress.  She has wheezes.  Abdominal: Soft. Bowel sounds are normal. She exhibits no distension.  Musculoskeletal: Normal range of motion. She exhibits tenderness. She exhibits no edema.  Left hand and fingers, normal ROM  Lymphadenopathy:    She has no cervical adenopathy.  Neurological: She is alert and oriented to person, place, and time. She exhibits normal muscle tone. Coordination normal.  Skin: Skin is warm and dry. No rash noted. She is not diaphoretic. No erythema. No pallor.  Small skin tear, less than 1 x 1 cm on left index finger pad, no active bleeding, normal capillary refill  Psychiatric: She has a normal mood and affect. Her behavior is normal. Judgment and thought content normal.  Nursing note and vitals reviewed.   ED Course  Procedures (including critical care time) Labs Review Labs Reviewed - No data to display  Imaging Review No results found. I have personally reviewed and evaluated these images and lab results as part of my medical decision-making.   EKG Interpretation None      MDM   Simple laceration to left index finger pad, occurred last night.  On exam, small skin tear, no area gapping or repairable.  No active bleeding.  No nailbed involvement.  Normal ROM, no joint involvement.  Normal capillary refill.  Tetanus updated.    Wound dressed, with abx ointment.  Finger splint applied Pt had not yet taken her morning BP meds, was mildly hypertensive in the ER.   She denies CP, HA, abdominal pain, no SOB at rest.  Pt had recent COPD exacerbation, on home O2, denies SOB, claims improving wheeze and exertional dyspnea.  Feel she is safe to discharge home, her chronic issues are under control and she states improving.  Only acute issue is her injury  Vitals improved at the time of discharge. Filed Vitals:   06/25/15 1012 06/25/15 1057  BP: 176/97 152/83  Pulse: 100 95  Temp: 98.2 F (36.8 C)   Resp: 18 20   Pt will f/up with PCP for wound check. Return  precautions reviewed.  Final diagnoses:  Finger  laceration, initial encounter      Delsa Grana, PA-C 06/26/15 0052  Tanna Furry, MD 07/07/15 [phone]

## 2015-06-25 NOTE — ED Notes (Signed)
Patient preparing for discharge. 

## 2015-06-25 NOTE — Discharge Instructions (Signed)
Apply antibiotic ointment to wound followed by non-adhesive gauze or bandage.  Keep clean.  Use finger splint as needed for comfort and to protect while healing over the first 3-5 days  Nonsutured Laceration Care A laceration is a cut that goes through all layers of the skin and extends into the tissue that is right under the skin. This type of cut is usually stitched up (sutured) or closed with tape (adhesive strips) or skin glue shortly after the injury happens. However, if the wound is dirty or if several hours pass before medical treatment is provided, it is likely that germs (bacteria) will enter the wound. Closing a laceration after bacteria have entered it increases the risk of infection. In these cases, your health care provider may leave the laceration open (nonsutured) and cover it with a bandage. This type of treatment helps prevent infection and allows the wound to heal from the deepest layer of tissue damage up to the surface. An open fracture is a type of injury that may involve nonsutured lacerations. An open fracture is a break in a bone that happens along with one or more lacerations through the skin that is near the fracture site. HOW TO CARE FOR YOUR NONSUTURED LACERATION  Take or apply over-the-counter and prescription medicines only as told by your health care provider.  If you were prescribed an antibiotic medicine, take or apply it as told by your health care provider. Do not stop using the antibiotic even if your condition improves.  Clean the wound one time each day or as told by your health care provider.  Wash the wound with mild soap and water.  Rinse the wound with water to remove all soap.  Pat your wound dry with a clean towel. Do not rub the wound.  Do not inject anything into the wound unless your health care provider told you to.  Change any bandages (dressings) as told by your health care provider. This includes changing the dressing if it gets wet, dirty, or  starts to smell bad.  Keep the dressing dry until your health care provider says it can be removed. Do not take baths, swim, or do anything that puts your wound underwater until your health care provider approves.  Raise (elevate) the injured area above the level of your heart while you are sitting or lying down, if possible.  Do not scratch or pick at the wound.  Check your wound every day for signs of infection. Watch for:  Redness, swelling, or pain.  Fluid, blood, or pus.  Keep all follow-up visits as told by your health care provider. This is important. SEEK MEDICAL CARE IF:  You received a tetanus and shot and you have swelling, severe pain, redness, or bleeding at the injection site.   You have a fever.  Your pain is not controlled with medicine.  You have increased redness, swelling, or pain at the site of your wound.  You have fluid, blood, or pus coming from your wound.  You notice a bad smell coming from your wound or your dressing.  You notice something coming out of the wound, such as wood or glass.  You notice a change in the color of your skin near your wound.  You develop a new rash.  You need to change the dressing frequently due to fluid, blood, or pus draining from the wound.  You develop numbness around your wound. SEEK IMMEDIATE MEDICAL CARE IF:  Your pain suddenly increases and is severe.  You develop severe swelling around the wound.  The wound is on your hand or foot and you cannot properly move a finger or toe.  The wound is on your hand or foot and you notice that your fingers or toes look pale or bluish.  You have a red streak going away from your wound.   This information is not intended to replace advice given to you by your health care provider. Make sure you discuss any questions you have with your health care provider.   Document Released: 03/31/2006 Document Revised: 09/17/2014 Document Reviewed: 04/29/2014 Elsevier Interactive  Patient Education 2016 [place] A skin tear is when the top layer of skin peels off. To repair the skin, your doctor may use:   Tape.  Skin adhesive strips. HOME CARE  Change bandages (dressings) once a day or as told by your doctor.  Gently clean the area with salt (saline) solution or with a mild soap and water.  Do not rub the injured skin dry. Let the area air dry.  Put petroleum jelly or antibiotic cream on the tear. Do not allow a scab to form.  If the bandage sticks, moisten it with warm soapy water and remove it.  Protect the injured skin until it has healed.  Only take medicine as told by your doctor.  Take showers or baths using warm soapy water. Apply a new bandage after the shower or bath.  Keep all doctor visits as told. GET HELP RIGHT AWAY IF:   You have redness, puffiness (swelling), or more pain in the tear.  You have ayellowish-white fluid (pus) coming from the tear.  You have chills.  You have a red streak that goes away from the tear.  You have a bad smell coming from the tear or bandage.  You have a fever or lasting symptoms for more than 2-3 days.  You have a fever and your symptoms suddenly get worse. MAKE SURE YOU:   Understand these instructions.  Will watch this condition.  Will get help right away if you are not doing well or get worse.   This information is not intended to replace advice given to you by your health care provider. Make sure you discuss any questions you have with your health care provider.   Document Released: 02/10/2008 Document Revised: 01/26/2012 Document Reviewed: 11/15/2011 Elsevier Interactive Patient Education [employer].

## 2015-06-26 DIAGNOSIS — M5137 Other intervertebral disc degeneration, lumbosacral region: Secondary | ICD-10-CM | POA: Diagnosis not present

## 2015-06-26 DIAGNOSIS — G894 Chronic pain syndrome: Secondary | ICD-10-CM | POA: Diagnosis not present

## 2015-06-26 DIAGNOSIS — M47817 Spondylosis without myelopathy or radiculopathy, lumbosacral region: Secondary | ICD-10-CM | POA: Diagnosis not present

## 2015-06-26 DIAGNOSIS — M1288 Other specific arthropathies, not elsewhere classified, other specified site: Secondary | ICD-10-CM | POA: Diagnosis not present

## 2015-06-26 DIAGNOSIS — Z79899 Other long term (current) drug therapy: Secondary | ICD-10-CM | POA: Diagnosis not present

## 2015-07-03 ENCOUNTER — Telehealth: Payer: Self-pay | Admitting: Physician Assistant

## 2015-07-03 DIAGNOSIS — J441 Chronic obstructive pulmonary disease with (acute) exacerbation: Secondary | ICD-10-CM

## 2015-07-03 NOTE — Telephone Encounter (Signed)
Called [facility] to discuss verbal order for for oxygen concentrator. [facility] said current O2 supplier would need to supply O2 in the interim as a) they cannot accept a verbal order and b) setting up a mobile oxygen concentrator for a new pt is a 6-8 week process. I then called the pt to determine her current O2 supplier. She stated that she had been using an oxygen concentrator that belonged to her mother. Per pt, she was off of home O2 since Aug. 2016 until Feb. 2017 when she had an appt w/ [facility] and her O2 sat was 82% on RA in our office. She was sent to ED and discharged. She states she was told by EDP that she need to be on home O2, so she restarted using the concentrator she obtained from her mother but the [place] that owns the equipment came from [place] and picked it up a week ago. She has been without oxygen since that time. She then told me that her O2 sat was currently 74% on room air even after using nebulizer treatments. She was noted to take occasional deep breath, but was able to speak in full and complete sentences without becoming short of breath. She says "if I sit here long enough, it'll go back up but then it drops as soon as I try to do anything." I advised pt to proceed to ED as she clearly needed supplemental O2 at this time and we would not be able to set this up for her as urgently as she needed. Pt stated that she did not have anyone to drive her to ED right now but would have her cousin take her when he/she returned from the store ("they should be back any minute"). I offered to call 911 for pt to be transported by ambulance and she adamantly refused, stating that her insurance would not pay for it and she could not afford it. She agreed to have a family member take her to ED ASAP.

## 2015-07-03 NOTE — Telephone Encounter (Signed)
To PCP for review

## 2015-07-03 NOTE — Telephone Encounter (Signed)
Relation to [id number] Call back number:[phone]   Reason for call:  Patient requesting a Rx for oxygen concentrator / mobile 2liter. Insurance advised patient PCP would know what manufacutor to use, as per insurance will not cover [employer] but will cover [facility]. Please advise  Patient states she needs this as soon as possible because she is out of oxygen.

## 2015-07-03 NOTE — Telephone Encounter (Signed)
Called pt back to follow up. She is still at home awaiting the return of a family member. She again states she should be back "shortly" as she "was just going to the store." Interestingly, pt also reports that a neighbor was able to bring her one of their back up O2 tanks to use and her O2 sat is currently 92%. She says this O2 tank should last at least 24 hours and that he has another one she can use after that. I recommended that pt still proceed to ED considering her prolonged hypoxia. She stated that if her O2 dropped below 90% on the oxygen, then she would go immediately, but otherwise she was going to stay at home. I repeated to pt that she should still be evaluated in ED today even though her O2 sat had recovered on supplemental O2, as she is likely still hypoxemic and acidotic. Pt aware of instructions and refused recommendations.She stated, "I understand, and if it drops below 90%, I will go. I'm not going to die."   [facility], please advise next steps. Would you like pt to schedule OV or can we place referral to [facility]?

## 2015-07-03 NOTE — Telephone Encounter (Signed)
We can schedule an OV to assess further as long as O2 stays stable with O2.  Unfortunately I was not aware she was on any oxygen until her last visit when she was sent to the ER and even then I was told she wore nighttime oxygen only. Obviously she needs continuous O2 which we will get set up as quickly as possible. See if she can be scheduled tomorrow (30 minutes please). ER if anything worsens. Go ahead and see if we can set up medical social work to go out to the home for assessment and to help [place] expedite getting her home oxygen.

## 2015-07-03 NOTE — Telephone Encounter (Signed)
Orders for oxygen concentrator and continuous home O2 at 2L faxed to [place] per telephone order from provider. Urgent referral to social work for home assessment placed. Scheduled deferred to clinic scheduler; advised to schedule pt for 30 min OV and unblock spot on provider's scheduled as necessary.

## 2015-07-03 NOTE — Telephone Encounter (Signed)
She needs the order ASAP. I am not on office today. Please call [facility] and see if they will accept a verbal order for an oxygen concentrator with tubes and a plain face mask until I can write a written order tomorrow in office. Patient with bad COPD requiring O2 at 2L/min. Is out of oxygen.

## 2015-07-04 ENCOUNTER — Ambulatory Visit (INDEPENDENT_AMBULATORY_CARE_PROVIDER_SITE_OTHER): Payer: Medicare Other | Admitting: Physician Assistant

## 2015-07-04 ENCOUNTER — Ambulatory Visit (HOSPITAL_BASED_OUTPATIENT_CLINIC_OR_DEPARTMENT_OTHER)
Admission: RE | Admit: 2015-07-04 | Discharge: 2015-07-04 | Disposition: A | Discharge status: Home / Self Care | Payer: Medicare Other | Source: Ambulatory Visit | Attending: Physician Assistant | Admitting: Physician Assistant

## 2015-07-04 ENCOUNTER — Encounter: Payer: Self-pay | Admitting: Physician Assistant

## 2015-07-04 VITALS — BP 153/75 | HR 117 | Temp 98.3°F | Ht 64.0 in | Wt 308.4 lb

## 2015-07-04 DIAGNOSIS — J441 Chronic obstructive pulmonary disease with (acute) exacerbation: Secondary | ICD-10-CM | POA: Diagnosis not present

## 2015-07-04 DIAGNOSIS — R05 Cough: Secondary | ICD-10-CM | POA: Diagnosis not present

## 2015-07-04 DIAGNOSIS — Z87891 Personal history of nicotine dependence: Secondary | ICD-10-CM | POA: Insufficient documentation

## 2015-07-04 DIAGNOSIS — J449 Chronic obstructive pulmonary disease, unspecified: Secondary | ICD-10-CM | POA: Diagnosis not present

## 2015-07-04 MED ORDER — VARENICLINE TARTRATE 0.5 MG X 11 & 1 MG X 42 PO MISC: ORAL | Status: DC

## 2015-07-04 MED ORDER — PREDNISONE 10 MG PO TABS: ORAL_TABLET | ORAL | Status: DC

## 2015-07-04 MED ORDER — AMOXICILLIN-POT CLAVULANATE 875-125 MG PO TABS: 1.0000 | ORAL_TABLET | Freq: Two times a day (BID) | ORAL | Status: DC

## 2015-07-04 NOTE — Patient Instructions (Signed)
Please take the antibiotic and steroids as directed. Use the oxygen at home on 2L increase to 3L if needed to keep O2 90-95%.   Please go downstairs for chest x-ray. I will call with results.  Use home O2 that neighbor has lent you until [facility] comes this afternoon. If anything worsens go directly to the ER.

## 2015-07-04 NOTE — Progress Notes (Signed)
Pre visit review using our clinic review tool, if applicable. No additional management support is needed unless otherwise documented below in the visit note. 

## 2015-07-07 ENCOUNTER — Telehealth: Payer: Self-pay | Admitting: *Deleted

## 2015-07-07 ENCOUNTER — Other Ambulatory Visit: Payer: Self-pay | Admitting: Physician Assistant

## 2015-07-07 DIAGNOSIS — Z1231 Encounter for screening mammogram for malignant neoplasm of breast: Secondary | ICD-10-CM

## 2015-07-07 NOTE — Telephone Encounter (Signed)
Received Certificate of Medical Necessity for Oxygen; forwarded to provider/SLS 02/02

## 2015-07-07 NOTE — Assessment & Plan Note (Signed)
At presentation, patient was off of oxygen for 10 minutes between arriving at clinic and being assessed by [place]. O2 at 57% when pulse ox placed but quickly shot up to 80%. Patient was given 2L 02 via nasal cannula. Eventually needed 3L 02 to get O2 above 90%. Patient was taken off of O2 after she had time to rest and O2 decreased to 77% at its lowest. Patient very well-compensated, concerning that her COPD has obviously worsened over time. CXR obtained and negative for pneumonia. Pulmonology consulted to discuss case to determine need for ER evaluation versus sending patient home on her chronic O2 (as she has been using without our knowledge). Was recommended to send patient home with O2 and continue at home while having her follow up with Dr. Melvyn Novas in office. Patient given repeat prednisone taper and Rx Augmentin. [facility] contacted who will be setting up home O2 this afternoon. Patient to follow-up early next week. Continue pulse ox at home. ER precautions given.

## 2015-07-07 NOTE — Progress Notes (Signed)
Patient with history of COPD Gold III with recent COPD exacerbation presents to clinic today c/o continued wheezing and increased SOB. Has finished steroid taper and antibiotic. Endorses continued productive cough and chest tightness. O2 sats at home around 90% per patient.   Patient is requesting order for home oxygen. Unbeknownst to this provider patient has been using an oxygen concentrator daily that belonged to her deceased mother. It was previously only known that she used nighttime O2 per Pulmonology. Notes company came and picked up her concentrator the day before yesterday and she has been using spare oxygen that her neighbor has given her. Is followed by Pulmonology but has not been sen in > 1 year.   Past Medical History  Diagnosis Date  . Diabetes ([place])     Type II  . Neuropathy ([place])     Diabetes  . COPD (chronic obstructive pulmonary disease) ([place])   . Renal cancer ([place])     Left Kidney Removed  . Fibromyalgia   . Hepatitis C   . Abdominal mass of other site   . Chronic kidney disease, stage 3     Borderline Stage 2-3  . Depression   . Cervical compression fracture ([place])   . Hypercholesteremia   . Syncope   . Osteoarthritis   . Hypertension   . Hypothyroidism   . Chronic lower limb pain   . Morbid obesity ([place])   . CTS (carpal tunnel syndrome)   . RLS (restless legs syndrome)   . Venous stasis   . OSA (obstructive sleep apnea)     Current Outpatient Prescriptions on File Prior to Visit  Medication Sig Dispense Refill  . albuterol (PROVENTIL HFA;VENTOLIN HFA) 108 (90 Base) MCG/ACT inhaler Inhale 1-2 puffs into the lungs every 6 (six) hours as needed for wheezing. 1 Inhaler 0  . albuterol (PROVENTIL) (2.5 MG/3ML) 0.083% nebulizer solution Take 3 mLs (2.5 mg total) by nebulization every 6 (six) hours as needed for wheezing or shortness of breath. 75 mL 12  . Bisacodyl (LAXATIVE PO) Take by mouth as needed.    . clotrimazole-betamethasone (LOTRISONE) cream Apply  1 application topically 2 (two) times daily. 30 g 0  . Fluticasone-Salmeterol (ADVAIR DISKUS) 500-50 MCG/DOSE AEPB Inhale 1 puff into the lungs once. 60 each 5  . furosemide (LASIX) 20 MG tablet Take 1 tablet (20 mg total) by mouth daily as needed. 30 tablet 0  . insulin glargine (LANTUS) 100 UNIT/ML injection Inject 0.3 mLs (30 Units total) into the skin daily. 10 mL 0  . insulin lispro (HUMALOG) 100 UNIT/ML injection Inject 0.15 mLs (15 Units total) into the skin 3 (three) times daily before meals. 10 mL 5  . Insulin Syringe-Needle U-100 30G X 1/2" 1 ML MISC Use 4 times daily as directed. 100 each 3  . levothyroxine (SYNTHROID, LEVOTHROID) 75 MCG tablet Take 1 tablet (75 mcg total) by mouth daily before breakfast. 90 tablet 1  . nortriptyline (PAMELOR) 25 MG capsule Take 1 capsule (25 mg total) by mouth at bedtime. 30 capsule 3  . nortriptyline (PAMELOR) 75 MG capsule Take 1 capsule (75 mg total) by mouth at bedtime. 30 capsule 2  . nystatin (MYCOSTATIN/NYSTOP) 100000 UNIT/GM POWD Apply topically.    . Oxycodone HCl 10 MG TABS Take 1 tablet (10 mg total) by mouth every 8 (eight) hours as needed. 90 tablet 0  . polycarbophil (FIBERCON) 625 MG tablet Take 625 mg by mouth daily.    . pravastatin (PRAVACHOL) 20 MG tablet  Take 1 tablet (20 mg total) by mouth daily. 30 tablet 2  . sitaGLIPtin (JANUVIA) 50 MG tablet Take 1 tablet (50 mg total) by mouth daily. 90 tablet 1  . tiotropium (SPIRIVA) 18 MCG inhalation capsule Place 1 capsule (18 mcg total) into inhaler and inhale daily. 30 capsule 0   No current facility-administered medications on file prior to visit.    Allergies  Allergen Reactions  . Gabapentin Anaphylaxis  . Lyrica [Pregabalin] Shortness Of Breath    Trouble breathing  . Ketorolac Tromethamine Hives  . Lisinopril Cough    Family History  Problem Relation Age of Onset  . Heart attack Mother 58    Deceased  . Heart disease Mother   . Emphysema Mother   . COPD Father 73     Deceased  . Emphysema Father   . Alcoholism Father   . Alcoholism Mother   . Esophageal varices Father   . Alcoholism Paternal Grandfather   . Diabetes Maternal Grandmother   . Heart disease Maternal Grandmother   . Lung cancer Maternal Grandfather   . Emphysema Maternal Grandfather   . Brain cancer Maternal Aunt   . Diabetes Sister   . Heart defect Sister   . Obesity Son   . Breast cancer Maternal Aunt     Social History   Social History  . Marital Status: Single    Spouse Name: N/A  . Number of Children: N/A  . Years of Education: N/A   Social History Main Topics  . Smoking status: Former Smoker -- 2.50 packs/day for 45 years    Types: Cigarettes    Quit date: 09/25/2013  . Smokeless tobacco: Never Used  . Alcohol Use: No  . Drug Use: No  . Sexual Activity: No   Other Topics Concern  . None   Social History Narrative   Review of Systems - See HPI.  All other ROS are negative.  BP 153/75 mmHg  Pulse 117  Temp(Src) 98.3 F (36.8 C) (Oral)  Ht _0  (1.626 m)  Wt 308 lb 6.4 oz (139.889 kg)  BMI 52.91 kg/m2  SpO2 80%  Physical Exam  Constitutional: She is oriented to person, place, and time and well-developed, well-nourished, and in no distress.  HENT:  Head: Normocephalic and atraumatic.  Right Ear: External ear normal.  Left Ear: External ear normal.  Nose: Nose normal.  Mouth/Throat: Oropharynx is clear and moist. No oropharyngeal exudate.  TM within normal limits bilaterally.  Eyes: Conjunctivae are normal.  Neck: Neck supple.  Cardiovascular: Normal rate, regular rhythm, normal heart sounds and intact distal pulses.   Pulmonary/Chest: Effort normal. She has wheezes. She has no rales. She exhibits no tenderness.  Neurological: She is alert and oriented to person, place, and time.  Skin: Skin is warm and dry. No rash noted.  Psychiatric: Affect normal.  Vitals reviewed.   Recent Results (from the past 2160 hour(s))  CBC with Differential      Status: Abnormal   Collection Time: 06/18/15  3:00 PM  Result Value Ref Range   WBC 8.0 4.0 - 10.5 K/uL   RBC 5.12 (H) 3.87 - 5.11 MIL/uL   Hemoglobin 13.2 12.0 - 15.0 g/dL   HCT 44.5 36.0 - 46.0 %   MCV 86.9 78.0 - 100.0 fL   MCH 25.8 (L) 26.0 - 34.0 pg   MCHC 29.7 (L) 30.0 - 36.0 g/dL   RDW 17.0 (H) 11.5 - 15.5 %   Platelets 83 (L) 150 - 400  K/uL    Comment: PLATELET COUNT CONFIRMED BY SMEAR SPECIMEN CHECKED FOR CLOTS REPEATED TO VERIFY    Neutrophils Relative % 75 %   Neutro Abs 6.0 1.7 - 7.7 K/uL   Lymphocytes Relative 17 %   Lymphs Abs 1.3 0.7 - 4.0 K/uL   Monocytes Relative 8 %   Monocytes Absolute 0.6 0.1 - 1.0 K/uL   Eosinophils Relative 1 %   Eosinophils Absolute 0.1 0.0 - 0.7 K/uL   Basophils Relative 0 %   Basophils Absolute 0.0 0.0 - 0.1 K/uL  Comprehensive metabolic panel     Status: Abnormal   Collection Time: 06/18/15  3:00 PM  Result Value Ref Range   Sodium 138 135 - 145 mmol/L   Potassium 3.8 3.5 - 5.1 mmol/L   Chloride 92 (L) 101 - 111 mmol/L   CO2 37 (H) 22 - 32 mmol/L   Glucose, Bld 127 (H) 65 - 99 mg/dL   BUN 19 6 - 20 mg/dL   Creatinine, Ser 0.99 0.44 - 1.00 mg/dL   Calcium 9.2 8.9 - 10.3 mg/dL   Total Protein 7.9 6.5 - 8.1 g/dL   Albumin 3.9 3.5 - 5.0 g/dL   AST 18 15 - 41 U/L   ALT 14 14 - 54 U/L   Alkaline Phosphatase 69 38 - 126 U/L   Total Bilirubin 0.3 0.3 - 1.2 mg/dL   GFR calc non Af Amer >60 >60 mL/min   GFR calc Af Amer >60 >60 mL/min    Comment: (NOTE) The eGFR has been calculated using the CKD EPI equation. This calculation has not been validated in all clinical situations. eGFR's persistently <60 mL/min signify possible Chronic Kidney Disease.    Anion gap 9 5 - 15  Troponin I     Status: None   Collection Time: 06/18/15  3:00 PM  Result Value Ref Range   Troponin I <0.03 <0.031 ng/mL    Comment:        NO INDICATION OF MYOCARDIAL INJURY.     Assessment/Plan: COPD exacerbation ([place]) At presentation, patient was off of  oxygen for 10 minutes between arriving at clinic and being assessed by [place]. O2 at 57% when pulse ox placed but quickly shot up to 80%. Patient was given 2L 02 via nasal cannula. Eventually needed 3L 02 to get O2 above 90%. Patient was taken off of O2 after she had time to rest and O2 decreased to 77% at its lowest. Patient very well-compensated, concerning that her COPD has obviously worsened over time. CXR obtained and negative for pneumonia. Pulmonology consulted to discuss case to determine need for ER evaluation versus sending patient home on her chronic O2 (as she has been using without our knowledge). Was recommended to send patient home with O2 and continue at home while having her follow up with Dr. Melvyn Novas in office. Patient given repeat prednisone taper and Rx Augmentin. [facility] contacted who will be setting up home O2 this afternoon. Patient to follow-up early next week. Continue pulse ox at home. ER precautions given.

## 2015-07-09 ENCOUNTER — Ambulatory Visit: Payer: Medicare Other

## 2015-07-09 ENCOUNTER — Ambulatory Visit: Payer: Medicare Other | Admitting: Physician Assistant

## 2015-07-09 ENCOUNTER — Telehealth: Payer: Self-pay | Admitting: Physician Assistant

## 2015-07-11 ENCOUNTER — Encounter: Payer: Self-pay | Admitting: Adult Health

## 2015-07-11 ENCOUNTER — Ambulatory Visit (INDEPENDENT_AMBULATORY_CARE_PROVIDER_SITE_OTHER): Payer: Medicare Other | Admitting: Physician Assistant

## 2015-07-11 ENCOUNTER — Inpatient Hospital Stay (HOSPITAL_COMMUNITY)
Admission: EM | Admit: 2015-07-11 | Discharge: 2015-07-17 | DRG: 190 | Disposition: A | Discharge status: Home / Self Care | Payer: Medicare Other | Attending: Internal Medicine | Admitting: Internal Medicine

## 2015-07-11 ENCOUNTER — Emergency Department (HOSPITAL_COMMUNITY): Payer: Medicare Other

## 2015-07-11 ENCOUNTER — Encounter: Payer: Self-pay | Admitting: Physician Assistant

## 2015-07-11 ENCOUNTER — Ambulatory Visit (INDEPENDENT_AMBULATORY_CARE_PROVIDER_SITE_OTHER): Payer: Medicare Other | Admitting: Adult Health

## 2015-07-11 ENCOUNTER — Inpatient Hospital Stay: Admission: AD | Admit: 2015-07-11 | Payer: Medicare Other | Source: Ambulatory Visit | Admitting: Internal Medicine

## 2015-07-11 ENCOUNTER — Encounter (HOSPITAL_COMMUNITY): Payer: Self-pay

## 2015-07-11 ENCOUNTER — Ambulatory Visit: Payer: Medicare Other | Admitting: Adult Health

## 2015-07-11 ENCOUNTER — Telehealth: Payer: Self-pay | Admitting: Internal Medicine

## 2015-07-11 VITALS — BP 126/86 | HR 111 | Temp 98.3°F | Ht 64.0 in | Wt 302.0 lb

## 2015-07-11 VITALS — BP 150/76 | HR 101 | Temp 97.7°F | Resp 14 | Ht 64.0 in

## 2015-07-11 DIAGNOSIS — Z801 Family history of malignant neoplasm of trachea, bronchus and lung: Secondary | ICD-10-CM | POA: Diagnosis not present

## 2015-07-11 DIAGNOSIS — J209 Acute bronchitis, unspecified: Secondary | ICD-10-CM | POA: Diagnosis not present

## 2015-07-11 DIAGNOSIS — D696 Thrombocytopenia, unspecified: Secondary | ICD-10-CM | POA: Diagnosis present

## 2015-07-11 DIAGNOSIS — Z825 Family history of asthma and other chronic lower respiratory diseases: Secondary | ICD-10-CM | POA: Diagnosis not present

## 2015-07-11 DIAGNOSIS — E039 Hypothyroidism, unspecified: Secondary | ICD-10-CM | POA: Diagnosis present

## 2015-07-11 DIAGNOSIS — R0902 Hypoxemia: Secondary | ICD-10-CM | POA: Insufficient documentation

## 2015-07-11 DIAGNOSIS — N183 Chronic kidney disease, stage 3 (moderate): Secondary | ICD-10-CM | POA: Diagnosis not present

## 2015-07-11 DIAGNOSIS — E785 Hyperlipidemia, unspecified: Secondary | ICD-10-CM | POA: Diagnosis present

## 2015-07-11 DIAGNOSIS — E1140 Type 2 diabetes mellitus with diabetic neuropathy, unspecified: Secondary | ICD-10-CM | POA: Diagnosis not present

## 2015-07-11 DIAGNOSIS — Z6841 Body Mass Index (BMI) 40.0 and over, adult: Secondary | ICD-10-CM | POA: Diagnosis not present

## 2015-07-11 DIAGNOSIS — F1910 Other psychoactive substance abuse, uncomplicated: Secondary | ICD-10-CM

## 2015-07-11 DIAGNOSIS — Z7952 Long term (current) use of systemic steroids: Secondary | ICD-10-CM

## 2015-07-11 DIAGNOSIS — F329 Major depressive disorder, single episode, unspecified: Secondary | ICD-10-CM | POA: Diagnosis present

## 2015-07-11 DIAGNOSIS — D6959 Other secondary thrombocytopenia: Secondary | ICD-10-CM | POA: Diagnosis present

## 2015-07-11 DIAGNOSIS — E7800 Pure hypercholesterolemia, unspecified: Secondary | ICD-10-CM | POA: Diagnosis present

## 2015-07-11 DIAGNOSIS — J441 Chronic obstructive pulmonary disease with (acute) exacerbation: Secondary | ICD-10-CM | POA: Diagnosis not present

## 2015-07-11 DIAGNOSIS — Z888 Allergy status to other drugs, medicaments and biological substances status: Secondary | ICD-10-CM

## 2015-07-11 DIAGNOSIS — E1122 Type 2 diabetes mellitus with diabetic chronic kidney disease: Secondary | ICD-10-CM | POA: Diagnosis not present

## 2015-07-11 DIAGNOSIS — F17200 Nicotine dependence, unspecified, uncomplicated: Secondary | ICD-10-CM | POA: Diagnosis present

## 2015-07-11 DIAGNOSIS — Z85528 Personal history of other malignant neoplasm of kidney: Secondary | ICD-10-CM

## 2015-07-11 DIAGNOSIS — G2581 Restless legs syndrome: Secondary | ICD-10-CM | POA: Diagnosis present

## 2015-07-11 DIAGNOSIS — M1990 Unspecified osteoarthritis, unspecified site: Secondary | ICD-10-CM | POA: Diagnosis present

## 2015-07-11 DIAGNOSIS — R0602 Shortness of breath: Secondary | ICD-10-CM | POA: Diagnosis not present

## 2015-07-11 DIAGNOSIS — R600 Localized edema: Secondary | ICD-10-CM | POA: Diagnosis not present

## 2015-07-11 DIAGNOSIS — M797 Fibromyalgia: Secondary | ICD-10-CM | POA: Diagnosis present

## 2015-07-11 DIAGNOSIS — R0600 Dyspnea, unspecified: Secondary | ICD-10-CM | POA: Diagnosis not present

## 2015-07-11 DIAGNOSIS — Z886 Allergy status to analgesic agent status: Secondary | ICD-10-CM

## 2015-07-11 DIAGNOSIS — E119 Type 2 diabetes mellitus without complications: Secondary | ICD-10-CM | POA: Diagnosis not present

## 2015-07-11 DIAGNOSIS — Z8619 Personal history of other infectious and parasitic diseases: Secondary | ICD-10-CM | POA: Diagnosis present

## 2015-07-11 DIAGNOSIS — Z833 Family history of diabetes mellitus: Secondary | ICD-10-CM | POA: Diagnosis not present

## 2015-07-11 DIAGNOSIS — J9600 Acute respiratory failure, unspecified whether with hypoxia or hypercapnia: Secondary | ICD-10-CM | POA: Diagnosis present

## 2015-07-11 DIAGNOSIS — Z720 Tobacco use: Secondary | ICD-10-CM | POA: Diagnosis present

## 2015-07-11 DIAGNOSIS — E1129 Type 2 diabetes mellitus with other diabetic kidney complication: Secondary | ICD-10-CM

## 2015-07-11 DIAGNOSIS — J449 Chronic obstructive pulmonary disease, unspecified: Secondary | ICD-10-CM | POA: Diagnosis present

## 2015-07-11 DIAGNOSIS — J9601 Acute respiratory failure with hypoxia: Secondary | ICD-10-CM | POA: Diagnosis present

## 2015-07-11 DIAGNOSIS — Z803 Family history of malignant neoplasm of breast: Secondary | ICD-10-CM

## 2015-07-11 DIAGNOSIS — Z794 Long term (current) use of insulin: Secondary | ICD-10-CM

## 2015-07-11 DIAGNOSIS — G4733 Obstructive sleep apnea (adult) (pediatric): Secondary | ICD-10-CM | POA: Diagnosis present

## 2015-07-11 DIAGNOSIS — I129 Hypertensive chronic kidney disease with stage 1 through stage 4 chronic kidney disease, or unspecified chronic kidney disease: Secondary | ICD-10-CM | POA: Diagnosis not present

## 2015-07-11 DIAGNOSIS — Z87891 Personal history of nicotine dependence: Secondary | ICD-10-CM | POA: Diagnosis not present

## 2015-07-11 DIAGNOSIS — N189 Chronic kidney disease, unspecified: Secondary | ICD-10-CM | POA: Diagnosis present

## 2015-07-11 DIAGNOSIS — B182 Chronic viral hepatitis C: Secondary | ICD-10-CM | POA: Diagnosis present

## 2015-07-11 DIAGNOSIS — J440 Chronic obstructive pulmonary disease with acute lower respiratory infection: Secondary | ICD-10-CM | POA: Diagnosis present

## 2015-07-11 DIAGNOSIS — Z808 Family history of malignant neoplasm of other organs or systems: Secondary | ICD-10-CM

## 2015-07-11 DIAGNOSIS — Z79899 Other long term (current) drug therapy: Secondary | ICD-10-CM | POA: Diagnosis not present

## 2015-07-11 DIAGNOSIS — I878 Other specified disorders of veins: Secondary | ICD-10-CM | POA: Diagnosis not present

## 2015-07-11 DIAGNOSIS — N179 Acute kidney failure, unspecified: Secondary | ICD-10-CM | POA: Diagnosis not present

## 2015-07-11 DIAGNOSIS — E860 Dehydration: Secondary | ICD-10-CM | POA: Diagnosis present

## 2015-07-11 DIAGNOSIS — Z8249 Family history of ischemic heart disease and other diseases of the circulatory system: Secondary | ICD-10-CM

## 2015-07-11 DIAGNOSIS — R05 Cough: Secondary | ICD-10-CM | POA: Diagnosis not present

## 2015-07-11 LAB — BASIC METABOLIC PANEL
ANION GAP: 7 (ref 5–15)
BUN: 21 mg/dL — AB (ref 6–20)
CHLORIDE: 92 mmol/L — AB (ref 101–111)
CO2: 43 mmol/L — ABNORMAL HIGH (ref 22–32)
Calcium: 9 mg/dL (ref 8.9–10.3)
Creatinine, Ser: 1.02 mg/dL — ABNORMAL HIGH (ref 0.44–1.00)
GFR calc Af Amer: >60 mL/min (ref >60)
GFR, EST NON AFRICAN AMERICAN: 58 mL/min — AB (ref >60)
Glucose, Bld: 235 mg/dL — ABNORMAL HIGH (ref 65–99)
POTASSIUM: 5 mmol/L (ref 3.5–5.1)
SODIUM: 142 mmol/L (ref 135–145)

## 2015-07-11 LAB — CBC WITH DIFFERENTIAL/PLATELET
BASOS PCT: 0 %
Basophils Absolute: 0 10*3/uL (ref 0.0–0.1)
EOS ABS: 0 10*3/uL (ref 0.0–0.7)
Eosinophils Relative: 0 %
HCT: 50 % — ABNORMAL HIGH (ref 36.0–46.0)
HEMOGLOBIN: 14 g/dL (ref 12.0–15.0)
LYMPHS PCT: 10 %
Lymphs Abs: 0.6 10*3/uL — ABNORMAL LOW (ref 0.7–4.0)
MCH: 26.2 pg (ref 26.0–34.0)
MCHC: 28 g/dL — ABNORMAL LOW (ref 30.0–36.0)
MCV: 93.5 fL (ref 78.0–100.0)
Monocytes Absolute: 0.1 10*3/uL (ref 0.1–1.0)
Monocytes Relative: 2 %
NEUTROS PCT: 88 %
Neutro Abs: 5.3 10*3/uL (ref 1.7–7.7)
Platelets: 65 10*3/uL — ABNORMAL LOW (ref 150–400)
RBC: 5.35 MIL/uL — AB (ref 3.87–5.11)
RDW: 16.8 % — ABNORMAL HIGH (ref 11.5–15.5)
WBC: 6 10*3/uL (ref 4.0–10.5)

## 2015-07-11 LAB — CBG MONITORING, ED: GLUCOSE-CAPILLARY: 219 mg/dL — AB (ref 65–99)

## 2015-07-11 LAB — TROPONIN I

## 2015-07-11 LAB — CREATININE, SERUM
Creatinine, Ser: 1.14 mg/dL — ABNORMAL HIGH (ref 0.44–1.00)
GFR, EST AFRICAN AMERICAN: 59 mL/min — AB (ref >60)
GFR, EST NON AFRICAN AMERICAN: 51 mL/min — AB (ref >60)

## 2015-07-11 LAB — BRAIN NATRIURETIC PEPTIDE: B Natriuretic Peptide: 143.1 pg/mL — ABNORMAL HIGH (ref 0.0–100.0)

## 2015-07-11 MED ORDER — METHYLPREDNISOLONE SODIUM SUCC 125 MG IJ SOLR
125.0000 mg | Freq: Once | INTRAMUSCULAR | Status: AC
  Administered 2015-07-11: 125 mg via INTRAVENOUS
  Filled 2015-07-11: qty 2

## 2015-07-11 MED ORDER — ACETAMINOPHEN 650 MG RE SUPP: 650.0000 mg | Freq: Four times a day (QID) | RECTAL | Status: DC | PRN

## 2015-07-11 MED ORDER — VARENICLINE TARTRATE 0.5 MG PO TABS
0.5000 mg | ORAL_TABLET | Freq: Two times a day (BID) | ORAL | Status: AC
  Administered 2015-07-13 – 2015-07-16 (×8): 0.5 mg via ORAL
  Filled 2015-07-11 (×8): qty 1

## 2015-07-11 MED ORDER — IPRATROPIUM-ALBUTEROL 0.5-2.5 (3) MG/3ML IN SOLN
3.0000 mL | Freq: Once | RESPIRATORY_TRACT | Status: AC
  Administered 2015-07-11: 3 mL via RESPIRATORY_TRACT

## 2015-07-11 MED ORDER — ONDANSETRON HCL 4 MG/2ML IJ SOLN: 4.0000 mg | Freq: Four times a day (QID) | INTRAMUSCULAR | Status: DC | PRN

## 2015-07-11 MED ORDER — ONDANSETRON HCL 4 MG PO TABS: 4.0000 mg | ORAL_TABLET | Freq: Four times a day (QID) | ORAL | Status: DC | PRN

## 2015-07-11 MED ORDER — ALBUTEROL (5 MG/ML) CONTINUOUS INHALATION SOLN
10.0000 mg/h | INHALATION_SOLUTION | Freq: Once | RESPIRATORY_TRACT | Status: AC
  Administered 2015-07-11: 10 mg/h via RESPIRATORY_TRACT
  Filled 2015-07-11: qty 20

## 2015-07-11 MED ORDER — BUDESONIDE 0.25 MG/2ML IN SUSP
0.2500 mg | Freq: Two times a day (BID) | RESPIRATORY_TRACT | Status: DC
  Administered 2015-07-11 – 2015-07-17 (×12): 0.25 mg via RESPIRATORY_TRACT
  Filled 2015-07-11 (×12): qty 2

## 2015-07-11 MED ORDER — SODIUM CHLORIDE 0.9% FLUSH
3.0000 mL | INTRAVENOUS | Status: DC | PRN
  Administered 2015-07-16: 3 mL via INTRAVENOUS
  Filled 2015-07-11: qty 3

## 2015-07-11 MED ORDER — ALUM & MAG HYDROXIDE-SIMETH 200-200-20 MG/5ML PO SUSP: 30.0000 mL | Freq: Four times a day (QID) | ORAL | Status: DC | PRN

## 2015-07-11 MED ORDER — ALBUTEROL SULFATE (2.5 MG/3ML) 0.083% IN NEBU
2.5000 mg | INHALATION_SOLUTION | Freq: Four times a day (QID) | RESPIRATORY_TRACT | Status: DC
  Administered 2015-07-12: 2.5 mg via RESPIRATORY_TRACT
  Filled 2015-07-11: qty 3

## 2015-07-11 MED ORDER — INSULIN ASPART 100 UNIT/ML ~~LOC~~ SOLN
0.0000 [IU] | Freq: Three times a day (TID) | SUBCUTANEOUS | Status: DC
  Administered 2015-07-12: 11 [IU] via SUBCUTANEOUS
  Administered 2015-07-12: 3 [IU] via SUBCUTANEOUS

## 2015-07-11 MED ORDER — IPRATROPIUM-ALBUTEROL 0.5-2.5 (3) MG/3ML IN SOLN
3.0000 mL | Freq: Four times a day (QID) | RESPIRATORY_TRACT | Status: DC
  Administered 2015-07-11: 3 mL via RESPIRATORY_TRACT
  Filled 2015-07-11: qty 3

## 2015-07-11 MED ORDER — LEVOTHYROXINE SODIUM 75 MCG PO TABS
75.0000 ug | ORAL_TABLET | Freq: Every day | ORAL | Status: DC
  Administered 2015-07-11 – 2015-07-16 (×6): 75 ug via ORAL
  Filled 2015-07-11 (×7): qty 1

## 2015-07-11 MED ORDER — ALBUTEROL SULFATE (2.5 MG/3ML) 0.083% IN NEBU: 2.5000 mg | INHALATION_SOLUTION | RESPIRATORY_TRACT | Status: DC | PRN

## 2015-07-11 MED ORDER — INSULIN GLARGINE 100 UNIT/ML ~~LOC~~ SOLN
30.0000 [IU] | Freq: Every day | SUBCUTANEOUS | Status: DC
  Administered 2015-07-12 – 2015-07-17 (×6): 30 [IU] via SUBCUTANEOUS
  Filled 2015-07-11 (×6): qty 0.3

## 2015-07-11 MED ORDER — NORTRIPTYLINE HCL 25 MG PO CAPS
100.0000 mg | ORAL_CAPSULE | Freq: Every day | ORAL | Status: DC
  Administered 2015-07-11 – 2015-07-16 (×6): 100 mg via ORAL
  Filled 2015-07-11 (×9): qty 4

## 2015-07-11 MED ORDER — SODIUM CHLORIDE 0.9% FLUSH
3.0000 mL | Freq: Two times a day (BID) | INTRAVENOUS | Status: DC
  Administered 2015-07-12 – 2015-07-16 (×10): 3 mL via INTRAVENOUS

## 2015-07-11 MED ORDER — INSULIN ASPART 100 UNIT/ML ~~LOC~~ SOLN
0.0000 [IU] | Freq: Every day | SUBCUTANEOUS | Status: DC
  Administered 2015-07-11: 2 [IU] via SUBCUTANEOUS
  Filled 2015-07-11: qty 1

## 2015-07-11 MED ORDER — VARENICLINE TARTRATE 0.5 MG X 11 & 1 MG X 42 PO MISC: 1.0000 | Freq: Every day | ORAL | Status: DC

## 2015-07-11 MED ORDER — METHYLPREDNISOLONE SODIUM SUCC 125 MG IJ SOLR
80.0000 mg | Freq: Two times a day (BID) | INTRAMUSCULAR | Status: DC
  Administered 2015-07-12 – 2015-07-15 (×7): 80 mg via INTRAVENOUS
  Filled 2015-07-11: qty 1.28
  Filled 2015-07-11 (×3): qty 2
  Filled 2015-07-11: qty 1.28
  Filled 2015-07-11: qty 2
  Filled 2015-07-11: qty 1.28
  Filled 2015-07-11 (×3): qty 2

## 2015-07-11 MED ORDER — VARENICLINE TARTRATE 1 MG PO TABS
1.0000 mg | ORAL_TABLET | Freq: Two times a day (BID) | ORAL | Status: DC
  Administered 2015-07-17: 1 mg via ORAL
  Filled 2015-07-11 (×2): qty 1

## 2015-07-11 MED ORDER — GUAIFENESIN ER 600 MG PO TB12
1200.0000 mg | ORAL_TABLET | Freq: Two times a day (BID) | ORAL | Status: DC
  Administered 2015-07-11 – 2015-07-17 (×12): 1200 mg via ORAL
  Filled 2015-07-11 (×14): qty 2

## 2015-07-11 MED ORDER — LINAGLIPTIN 5 MG PO TABS
5.0000 mg | ORAL_TABLET | Freq: Every day | ORAL | Status: DC
  Administered 2015-07-12 – 2015-07-17 (×6): 5 mg via ORAL
  Filled 2015-07-11 (×7): qty 1

## 2015-07-11 MED ORDER — TIOTROPIUM BROMIDE MONOHYDRATE 18 MCG IN CAPS
18.0000 ug | ORAL_CAPSULE | Freq: Every day | RESPIRATORY_TRACT | Status: DC
  Administered 2015-07-12: 18 ug via RESPIRATORY_TRACT
  Filled 2015-07-11: qty 5

## 2015-07-11 MED ORDER — ENOXAPARIN SODIUM 80 MG/0.8ML ~~LOC~~ SOLN
70.0000 mg | SUBCUTANEOUS | Status: DC
  Administered 2015-07-11: 70 mg via SUBCUTANEOUS
  Filled 2015-07-11 (×2): qty 0.8

## 2015-07-11 MED ORDER — VARENICLINE TARTRATE 0.5 MG PO TABS
0.5000 mg | ORAL_TABLET | Freq: Every day | ORAL | Status: AC
  Administered 2015-07-12: 0.5 mg via ORAL
  Filled 2015-07-11: qty 1

## 2015-07-11 MED ORDER — NORTRIPTYLINE HCL 25 MG PO CAPS: 25.0000 mg | ORAL_CAPSULE | Freq: Every day | ORAL | Status: DC

## 2015-07-11 MED ORDER — DOXYCYCLINE HYCLATE 100 MG IV SOLR
100.0000 mg | Freq: Once | INTRAVENOUS | Status: AC
  Administered 2015-07-11: 100 mg via INTRAVENOUS
  Filled 2015-07-11: qty 100

## 2015-07-11 MED ORDER — OXYCODONE HCL 5 MG PO TABS
10.0000 mg | ORAL_TABLET | Freq: Three times a day (TID) | ORAL | Status: DC | PRN
  Administered 2015-07-11 – 2015-07-12 (×4): 10 mg via ORAL
  Filled 2015-07-11 (×4): qty 2

## 2015-07-11 MED ORDER — ACETAMINOPHEN 325 MG PO TABS: 650.0000 mg | ORAL_TABLET | Freq: Four times a day (QID) | ORAL | Status: DC | PRN

## 2015-07-11 MED ORDER — SODIUM CHLORIDE 0.9 % IV SOLN: 250.0000 mL | INTRAVENOUS | Status: DC | PRN

## 2015-07-11 MED ORDER — DOXYCYCLINE HYCLATE 100 MG PO TABS
100.0000 mg | ORAL_TABLET | Freq: Two times a day (BID) | ORAL | Status: AC
  Administered 2015-07-11 – 2015-07-16 (×10): 100 mg via ORAL
  Filled 2015-07-11 (×10): qty 1

## 2015-07-11 MED ORDER — PRAVASTATIN SODIUM 20 MG PO TABS
20.0000 mg | ORAL_TABLET | Freq: Every day | ORAL | Status: DC
  Administered 2015-07-12 – 2015-07-17 (×6): 20 mg via ORAL
  Filled 2015-07-11 (×6): qty 1

## 2015-07-11 MED ORDER — FUROSEMIDE 40 MG PO TABS: 20.0000 mg | ORAL_TABLET | Freq: Every day | ORAL | Status: DC

## 2015-07-11 MED ORDER — LEVOTHYROXINE SODIUM 75 MCG PO TABS: 75.0000 ug | ORAL_TABLET | Freq: Every day | ORAL | Status: DC

## 2015-07-11 NOTE — Telephone Encounter (Signed)
Called spoke with [place] from [facility] office. Requesting pt to be worked in today. Not feeling herself. O2 is about 89%-90% on her POC. Spoke with TP. Ok to work in at [employer] but pt will need to wait as she is a work in  [employer] aware. Nothing further needed

## 2015-07-11 NOTE — ED Notes (Signed)
Pt short of breath x 2 weeks.  Has recently been placed on oxygen x 2 weeks.  Low grade fever at home.  Pt MD thought she had pneumonia.  Pt has hx of COPD but was taken off oxygen last year.

## 2015-07-11 NOTE — ED Provider Notes (Signed)
CSN: [id number]     Arrival date & time 07/11/15  1553 History   First MD Initiated Contact with Patient 07/11/15 1626     Chief Complaint  Patient presents with  . Shortness of Breath  . Cough     (Consider location/radiation/quality/duration/timing/severity/associated sxs/prior Treatment) HPI Comments: Pt comes in with cc of dib.  Pt has hx of COPD, advanced. She used to be on home O2, but quit smoking in Aug and was taken off of O2. Pt started having dib 10 days ago, and she checked her O2 sats and she was hypoxic. She went to her pcp, got multiple treatments and was discharged home with her pcp ordering 2 liters O2. She has been taking constant O2, 3 liters now to stay at 95% (2 liters - 92%). Pt has continued wheezing, despite 4 neb tx everyday. Same cough, no new phlegm. Pt is still not a smoker. Pt was sent to the Pulmonologist today   ROS 10 Systems reviewed and are negative for acute change except as noted in the HPI.     Patient is a 60 y.o. female presenting with shortness of breath and cough. The history is provided by the patient.  Shortness of Breath Associated symptoms: cough   Cough Associated symptoms: shortness of breath     Past Medical History  Diagnosis Date  . Diabetes ([place])     Type II  . Neuropathy ([place])     Diabetes  . COPD (chronic obstructive pulmonary disease) ([place])   . Renal cancer ([place])     Left Kidney Removed  . Fibromyalgia   . Hepatitis C   . Abdominal mass of other site   . Chronic kidney disease, stage 3     Borderline Stage 2-3  . Depression   . Cervical compression fracture ([place])   . Hypercholesteremia   . Syncope   . Osteoarthritis   . Hypertension   . Hypothyroidism   . Chronic lower limb pain   . Morbid obesity ([place])   . CTS (carpal tunnel syndrome)   . RLS (restless legs syndrome)   . Venous stasis   . OSA (obstructive sleep apnea)    Past Surgical History  Procedure Laterality Date  . Nephrectomy      Left  .  Umbilical hernia repair    . Abdominal hernia repair      x2  . Incisional hernia repair    . Abdominal hysterectomy    . Bladder suspension    . Vagina surgery    . Cholecystectomy    . Wisdom tooth extraction    . Tooth extraction    . Tonsillectomy    . Knee arthroscopy      Bilateral  . Cyst excision      Head   Family History  Problem Relation Age of Onset  . Heart attack Mother 71    Deceased  . Heart disease Mother   . Emphysema Mother   . COPD Father 89    Deceased  . Emphysema Father   . Alcoholism Father   . Alcoholism Mother   . Esophageal varices Father   . Alcoholism Paternal Grandfather   . Diabetes Maternal Grandmother   . Heart disease Maternal Grandmother   . Lung cancer Maternal Grandfather   . Emphysema Maternal Grandfather   . Brain cancer Maternal Aunt   . Diabetes Sister   . Heart defect Sister   . Obesity Son   . Breast cancer Maternal Aunt  Social History  Substance Use Topics  . Smoking status: Former Smoker -- 2.50 packs/day for 45 years    Types: Cigarettes    Quit date: 09/25/2013  . Smokeless tobacco: Never Used  . Alcohol Use: No   OB History    No data available     Review of Systems  Respiratory: Positive for cough and shortness of breath.       Allergies  Gabapentin; Lyrica; Ketorolac tromethamine; and Lisinopril  Home Medications   Prior to Admission medications   Medication Sig Start Date End Date Taking? Authorizing Provider  albuterol (PROVENTIL HFA;VENTOLIN HFA) 108 (90 Base) MCG/ACT inhaler Inhale 1-2 puffs into the lungs every 6 (six) hours as needed for wheezing. 06/18/15   Tanna Furry, MD  albuterol (PROVENTIL) (2.5 MG/3ML) 0.083% nebulizer solution Take 3 mLs (2.5 mg total) by nebulization every 6 (six) hours as needed for wheezing or shortness of breath. 06/18/15   Tanna Furry, MD  amoxicillin-clavulanate (AUGMENTIN) 875-125 MG tablet Take 1 tablet by mouth 2 (two) times daily. 07/04/15   [name], PA-C   Bisacodyl (LAXATIVE PO) Take by mouth as needed.    Historical Provider, MD  clotrimazole-betamethasone (LOTRISONE) cream Apply 1 application topically 2 (two) times daily. 06/18/15   [name], PA-C  Fluticasone-Salmeterol (ADVAIR DISKUS) 500-50 MCG/DOSE AEPB Inhale 1 puff into the lungs once. 03/21/15   [name], PA-C  furosemide (LASIX) 20 MG tablet Take 1 tablet (20 mg total) by mouth daily as needed. 12/10/14   [name], PA-C  insulin glargine (LANTUS) 100 UNIT/ML injection Inject 0.3 mLs (30 Units total) into the skin daily. 06/02/15   [name], PA-C  insulin lispro (HUMALOG) 100 UNIT/ML injection Inject 0.15 mLs (15 Units total) into the skin 3 (three) times daily before meals. 02/07/15   [name], PA-C  Insulin Syringe-Needle U-100 30G X 1/2" 1 ML MISC Use 4 times daily as directed. 04/30/14   [name], PA-C  levothyroxine (SYNTHROID, LEVOTHROID) 75 MCG tablet Take 1 tablet (75 mcg total) by mouth daily before breakfast. 05/27/15   [name], PA-C  nortriptyline (PAMELOR) 25 MG capsule Take 1 capsule (25 mg total) by mouth at bedtime. Patient not taking: Reported on 07/11/2015 06/18/15   [name], PA-C  nortriptyline (PAMELOR) 75 MG capsule Take 1 capsule (75 mg total) by mouth at bedtime. 06/18/15   [name], PA-C  nystatin (MYCOSTATIN/NYSTOP) 100000 UNIT/GM POWD Apply topically.    Historical Provider, MD  Oxycodone HCl 10 MG TABS Take 1 tablet (10 mg total) by mouth every 8 (eight) hours as needed. 06/18/15   [name], PA-C  polycarbophil (FIBERCON) 625 MG tablet Take 625 mg by mouth daily.    Historical Provider, MD  pravastatin (PRAVACHOL) 20 MG tablet Take 1 tablet (20 mg total) by mouth daily. 04/28/15   [name], PA-C  predniSONE (DELTASONE) 10 MG tablet Take 4 tablets by mouth x 3 days, then take 3 tablets x 3 days, then 2 tablets x 3 days, then 1 tablet x 3 days. 07/04/15   [name], PA-C  sitaGLIPtin  (JANUVIA) 50 MG tablet Take 1 tablet (50 mg total) by mouth daily. 10/07/14   [name], PA-C  tiotropium (SPIRIVA) 18 MCG inhalation capsule Place 1 capsule (18 mcg total) into inhaler and inhale daily. 06/03/15   [name], PA-C  varenicline (CHANTIX STARTING MONTH PAK) 0.5 MG X 11 & 1  MG X 42 tablet Take one 0.5 mg tablet by mouth once daily for 3 days, then increase to one 0.5 mg tablet twice daily for 4 days, then increase to one 1 mg tablet twice daily. 07/04/15   [name], PA-C   BP 164/90 mmHg  Pulse 107  Temp(Src) 98.8 F (37.1 C) (Oral)  Resp 18  SpO2 92% Physical Exam  Constitutional: She is oriented to person, place, and time. She appears well-developed.  HENT:  Head: Normocephalic and atraumatic.  Eyes: EOM are normal.  Neck: Normal range of motion. Neck supple.  Cardiovascular: Normal rate.   Pulmonary/Chest: Effort normal. No respiratory distress. She has wheezes.  Diffuse wheezing  Abdominal: Bowel sounds are normal.  Musculoskeletal: She exhibits edema. She exhibits no tenderness.  Neurological: She is alert and oriented to person, place, and time.  Skin: Skin is warm and dry.  Nursing note and vitals reviewed.   ED Course  Procedures (including critical care time) Labs Review Labs Reviewed  CBC WITH DIFFERENTIAL/PLATELET  BASIC METABOLIC PANEL  BRAIN NATRIURETIC PEPTIDE  TROPONIN I    Imaging Review Dg Chest 2 View  07/11/2015  CLINICAL DATA:  Shortness of breath, cough and congestion for 2 weeks, hypertension, diabetes mellitus, smoker EXAM: CHEST  2 VIEW COMPARISON:  07/04/2015 ; correlation CTA chest 06/20/2013 FINDINGS: Enlargement of cardiac silhouette with slight pulmonary vascular congestion Mediastinal contours normal. 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 Peribronchial thickening without gross infiltrate, pleural effusion or pneumothorax. Scattered endplate spur formation thoracic  spine. IMPRESSION: Enlargement of cardiac silhouette with pulmonary vascular congestion. Bronchitic changes without infiltrate. LEFT diaphragmatic hernia, stable. Electronically Signed   By: Lavonia Dana M.D.   On: 07/11/2015 16:29   I have personally reviewed and evaluated these images and lab results as part of my medical decision-making.   EKG Interpretation   Date/Time:  Friday July 11 2015 17:17:15 EST Ventricular Rate:  107 PR Interval:  171 QRS Duration: 98 QT Interval:  325 QTC Calculation: 434 R Axis:   -49 Text Interpretation:  Sinus tachycardia Left anterior fascicular block  Anterior infarct, old Poor R wave progression in anterior leads No  significant change since last tracing Confirmed by Kathrynn Humble, MD, Thelma Comp  [phone]) on 07/11/2015 5:35:23 PM      MDM   Final diagnoses:  COPD with acute exacerbation ([place])    Pt comes in with cc of dib. She has hx of COPD, DM. No known CAD, she does take lasix for lower extremity edema.  PT has diffuse wheezing, and new O2 requirement. She has had failed aggressive outpatient management. She has exertional dyspnea, no chest pain, no new cough.  PE is in the ddx along with URI/Viral syndrome. Will give more treatments and steroids. Doxy added - as there could be an underlying atypical infection. We will reassess post treatment, and decide if Ct PE is needed.    Varney Biles, MD 07/11/15 1739

## 2015-07-11 NOTE — Addendum Note (Signed)
Addended by: Melvenia Needles on: 07/11/2015 03:27 PM   Modules accepted: Level of Service

## 2015-07-11 NOTE — Progress Notes (Signed)
Subjective:    Patient ID: Renee Noble, female    DOB: Mar 19, 1956,    MRN: [id number]    Brief patient profile:  45 yowf quit smoking 09/2013 on  Mothers day referred by Renee Noble to pulmonary clinic for copd evaluation 12/25/2013    07/11/2015 Acute OV : COPD  Pt presents for an acute office visit.  Complains over last 2-3 weeks of increased SOB, wheezing, and a dry cough x 2 weeks.  Seen in ER on 2/1 , tx w/ Nebs and started on O2 .  She says O2 helps but still has cough and wheezing .Seen by PCP on 07/04/15 started on Augmentin and prednisone. She returned back to PCP today without much improvement and O2 sats 84% on 2l/m . Sent to our office from PCP for evaluation. Pt gets very winded with activity . O2 sats were low on arrival at 84% on 2l/m , improved on 3l/m .  Denies any sinus pressure/drainage, chest congestion/drainage, fever, nausea or vomiting.  She is on  Advair and spiriva . Also taking albuterol Neb Four times a day  .  She will be admitted to Hospital for further treatment .     Current Medications, Allergies, Complete Past Medical History, Past Surgical History, Family History, and Social History were reviewed in [employer] record.  ROS   Constitutional:   No  weight loss, night sweats,  Fevers, chills,  +fatigue, or  lassitude.  HEENT:   No headaches,  Difficulty swallowing,  Tooth/dental problems, or  Sore throat,                No sneezing, itching, ear ache, nasal congestion, post nasal drip,   CV:  No chest pain,  Orthopnea, PND, swelling in lower extremities, anasarca, dizziness, palpitations, syncope.   GI  No heartburn, indigestion, abdominal pain, nausea, vomiting, diarrhea, change in bowel habits, loss of appetite, bloody stools.   Resp:   No chest wall deformity  Skin: no rash or lesions.  GU: no dysuria, change in color of urine, no urgency or frequency.  No flank pain, no hematuria   MS:  No joint pain or swelling.  No  decreased range of motion.  No back pain.  Psych:  No change in mood or affect. No depression or anxiety.  No memory loss.                            Objective:   Physical Exam Filed Vitals:   07/11/15 1415  BP: 126/86  Pulse: 111  Temp: 98.3 F (36.8 C)  TempSrc: Oral  Height: 5\' 4"  (1.626 m)  Weight: 302 lb (136.986 kg)  SpO2: 92%         amb obese wf  In wc on O2     HEENT: Full dentures -  nl  turbinates, and orophanx. Nl external ear canals without cough reflex   NECK :  without JVD/Nodes/TM/ nl carotid upstrokes bilaterally   LUNGS: diffuse exp wheezing w/ + psuedowheezing     CV:  RRR  no s3 or murmur or increase in P2, 1+  edema with VI changes   ABD:  soft and nontender with nl excursion in the supine position. No bruits or organomegaly, bowel sounds nl  MS:  warm without deformities, calf tenderness, cyanosis or clubbing  SKIN: warm and dry without lesions    NEURO:  alert, approp, no deficits  Assessment & Plan:

## 2015-07-11 NOTE — Progress Notes (Signed)
Chart and office note reviewed in detail  > agree with a/p as outlined    

## 2015-07-11 NOTE — Patient Instructions (Addendum)
Go to Emergency Room at [facility] Emergency room

## 2015-07-11 NOTE — Progress Notes (Signed)
Patient presents to clinic today for follow-up of COPD exacerbation. Endorses taking antibiotic and prednisone as directed. Endorses improvement in breathing since last visit but notes sheis having to crank up her home O2 to 3 L/min to keep O2 above 90%. Previously requiring only nighttime oxygen. CXR obtained at last visit negative. Patient denies new symptoms but endorses fatigue worsening. Has not called to schedule an appointment with her Pulmonologist.  Past Medical History  Diagnosis Date  . Diabetes ([place])     Type II  . Neuropathy ([place])     Diabetes  . COPD (chronic obstructive pulmonary disease) ([place])   . Renal cancer ([place])     Left Kidney Removed  . Fibromyalgia   . Hepatitis C   . Abdominal mass of other site   . Chronic kidney disease, stage 3     Borderline Stage 2-3  . Depression   . Cervical compression fracture ([place])   . Hypercholesteremia   . Syncope   . Osteoarthritis   . Hypertension   . Hypothyroidism   . Chronic lower limb pain   . Morbid obesity ([place])   . CTS (carpal tunnel syndrome)   . RLS (restless legs syndrome)   . Venous stasis   . OSA (obstructive sleep apnea)     Current Outpatient Prescriptions on File Prior to Visit  Medication Sig Dispense Refill  . albuterol (PROVENTIL HFA;VENTOLIN HFA) 108 (90 Base) MCG/ACT inhaler Inhale 1-2 puffs into the lungs every 6 (six) hours as needed for wheezing. 1 Inhaler 0  . albuterol (PROVENTIL) (2.5 MG/3ML) 0.083% nebulizer solution Take 3 mLs (2.5 mg total) by nebulization every 6 (six) hours as needed for wheezing or shortness of breath. 75 mL 12  . amoxicillin-clavulanate (AUGMENTIN) 875-125 MG tablet Take 1 tablet by mouth 2 (two) times daily. 14 tablet 0  . Bisacodyl (LAXATIVE PO) Take by mouth as needed.    . clotrimazole-betamethasone (LOTRISONE) cream Apply 1 application topically 2 (two) times daily. 30 g 0  . Fluticasone-Salmeterol (ADVAIR DISKUS) 500-50 MCG/DOSE AEPB Inhale 1 puff into the lungs  once. 60 each 5  . furosemide (LASIX) 20 MG tablet Take 1 tablet (20 mg total) by mouth daily as needed. 30 tablet 0  . insulin glargine (LANTUS) 100 UNIT/ML injection Inject 0.3 mLs (30 Units total) into the skin daily. 10 mL 0  . insulin lispro (HUMALOG) 100 UNIT/ML injection Inject 0.15 mLs (15 Units total) into the skin 3 (three) times daily before meals. 10 mL 5  . Insulin Syringe-Needle U-100 30G X 1/2" 1 ML MISC Use 4 times daily as directed. 100 each 3  . levothyroxine (SYNTHROID, LEVOTHROID) 75 MCG tablet Take 1 tablet (75 mcg total) by mouth daily before breakfast. 90 tablet 1  . nortriptyline (PAMELOR) 25 MG capsule Take 1 capsule (25 mg total) by mouth at bedtime. 30 capsule 3  . nortriptyline (PAMELOR) 75 MG capsule Take 1 capsule (75 mg total) by mouth at bedtime. 30 capsule 2  . nystatin (MYCOSTATIN/NYSTOP) 100000 UNIT/GM POWD Apply topically.    . Oxycodone HCl 10 MG TABS Take 1 tablet (10 mg total) by mouth every 8 (eight) hours as needed. 90 tablet 0  . polycarbophil (FIBERCON) 625 MG tablet Take 625 mg by mouth daily.    . pravastatin (PRAVACHOL) 20 MG tablet Take 1 tablet (20 mg total) by mouth daily. 30 tablet 2  . predniSONE (DELTASONE) 10 MG tablet Take 4 tablets by mouth x 3 days, then take 3 tablets  x 3 days, then 2 tablets x 3 days, then 1 tablet x 3 days. 30 tablet 0  . sitaGLIPtin (JANUVIA) 50 MG tablet Take 1 tablet (50 mg total) by mouth daily. 90 tablet 1  . tiotropium (SPIRIVA) 18 MCG inhalation capsule Place 1 capsule (18 mcg total) into inhaler and inhale daily. 30 capsule 0  . varenicline (CHANTIX STARTING MONTH PAK) 0.5 MG X 11 & 1 MG X 42 tablet Take one 0.5 mg tablet by mouth once daily for 3 days, then increase to one 0.5 mg tablet twice daily for 4 days, then increase to one 1 mg tablet twice daily. 53 tablet 0   No current facility-administered medications on file prior to visit.    Allergies  Allergen Reactions  . Gabapentin Anaphylaxis  . Lyrica  [Pregabalin] Shortness Of Breath    Trouble breathing  . Ketorolac Tromethamine Hives  . Lisinopril Cough    Family History  Problem Relation Age of Onset  . Heart attack Mother 2    Deceased  . Heart disease Mother   . Emphysema Mother   . COPD Father 57    Deceased  . Emphysema Father   . Alcoholism Father   . Alcoholism Mother   . Esophageal varices Father   . Alcoholism Paternal Grandfather   . Diabetes Maternal Grandmother   . Heart disease Maternal Grandmother   . Lung cancer Maternal Grandfather   . Emphysema Maternal Grandfather   . Brain cancer Maternal Aunt   . Diabetes Sister   . Heart defect Sister   . Obesity Son   . Breast cancer Maternal Aunt     Social History   Social History  . Marital Status: Single    Spouse Name: N/A  . Number of Children: N/A  . Years of Education: N/A   Social History Main Topics  . Smoking status: Former Smoker -- 2.50 packs/day for 45 years    Types: Cigarettes    Quit date: 09/25/2013  . Smokeless tobacco: Never Used  . Alcohol Use: No  . Drug Use: No  . Sexual Activity: No   Other Topics Concern  . Not on file   Social History Narrative    Review of Systems - See HPI.  All other ROS are negative.  There were no vitals taken for this visit.  Physical Exam  Constitutional: She is oriented to person, place, and time and well-developed, well-nourished, and in no distress.  HENT:  Head: Normocephalic and atraumatic.  Right Ear: External ear normal.  Left Ear: External ear normal.  Nose: Nose normal.  Mouth/Throat: Oropharynx is clear and moist. No oropharyngeal exudate.  TM within normal limits bilaterally  Eyes: Conjunctivae are normal.  Cardiovascular: Normal rate, regular rhythm, normal heart sounds and intact distal pulses.   Pulmonary/Chest: Effort normal. No respiratory distress. She has wheezes. She has no rales. She exhibits no tenderness.  Neurological: She is alert and oriented to person, place, and  time.  Skin: Skin is warm and dry. No rash noted.  Vitals reviewed.   Recent Results (from the past 2160 hour(s))  CBC with Differential     Status: Abnormal   Collection Time: 06/18/15  3:00 PM  Result Value Ref Range   WBC 8.0 4.0 - 10.5 K/uL   RBC 5.12 (H) 3.87 - 5.11 MIL/uL   Hemoglobin 13.2 12.0 - 15.0 g/dL   HCT 44.5 36.0 - 46.0 %   MCV 86.9 78.0 - 100.0 fL   MCH 25.8 (  L) 26.0 - 34.0 pg   MCHC 29.7 (L) 30.0 - 36.0 g/dL   RDW 17.0 (H) 11.5 - 15.5 %   Platelets 83 (L) 150 - 400 K/uL    Comment: PLATELET COUNT CONFIRMED BY SMEAR SPECIMEN CHECKED FOR CLOTS REPEATED TO VERIFY    Neutrophils Relative % 75 %   Neutro Abs 6.0 1.7 - 7.7 K/uL   Lymphocytes Relative 17 %   Lymphs Abs 1.3 0.7 - 4.0 K/uL   Monocytes Relative 8 %   Monocytes Absolute 0.6 0.1 - 1.0 K/uL   Eosinophils Relative 1 %   Eosinophils Absolute 0.1 0.0 - 0.7 K/uL   Basophils Relative 0 %   Basophils Absolute 0.0 0.0 - 0.1 K/uL  Comprehensive metabolic panel     Status: Abnormal   Collection Time: 06/18/15  3:00 PM  Result Value Ref Range   Sodium 138 135 - 145 mmol/L   Potassium 3.8 3.5 - 5.1 mmol/L   Chloride 92 (L) 101 - 111 mmol/L   CO2 37 (H) 22 - 32 mmol/L   Glucose, Bld 127 (H) 65 - 99 mg/dL   BUN 19 6 - 20 mg/dL   Creatinine, Ser 0.99 0.44 - 1.00 mg/dL   Calcium 9.2 8.9 - 10.3 mg/dL   Total Protein 7.9 6.5 - 8.1 g/dL   Albumin 3.9 3.5 - 5.0 g/dL   AST 18 15 - 41 U/L   ALT 14 14 - 54 U/L   Alkaline Phosphatase 69 38 - 126 U/L   Total Bilirubin 0.3 0.3 - 1.2 mg/dL   GFR calc non Af Amer >60 >60 mL/min   GFR calc Af Amer >60 >60 mL/min    Comment: (NOTE) The eGFR has been calculated using the CKD EPI equation. This calculation has not been validated in all clinical situations. eGFR's persistently <60 mL/min signify possible Chronic Kidney Disease.    Anion gap 9 5 - 15  Troponin I     Status: None   Collection Time: 06/18/15  3:00 PM  Result Value Ref Range   Troponin I <0.03 <0.031  ng/mL    Comment:        NO INDICATION OF MYOCARDIAL INJURY.     Assessment/Plan: No problem-specific assessment & plan notes found for this encounter.

## 2015-07-11 NOTE — Progress Notes (Signed)
Pre visit review using our clinic review tool, if applicable. No additional management support is needed unless otherwise documented below in the visit note. 

## 2015-07-11 NOTE — Assessment & Plan Note (Addendum)
Progressively worsening flare despite OP treatment regimen  Call back from bed control no available bed after waiting for >30 min at [facility] or [facility]  Case discussed and pt assessed by Dr. Melvyn Novas   Will send to ER at [facility] now for evaluation and consideration for admission .

## 2015-07-11 NOTE — H&P (Addendum)
[facility] History and Physical  [name] [id number] DOB: Jan 25, 1956 DOA: 07/11/2015   PCP: [name], PA-C    Chief Complaint: Cough and shortness of breath  HPI: Renee Noble is a 60 y.o. female with COPD not on home O2, insulin requiring diabetes mellitus, renal cancer status post left nephrectomy, hepatitis C, chronic pedal edema, hypertension, morbid obesity who presents to the hospital for cough shortness of breath and wheezing. The patient states that she has been sick with these symptoms for almost 2 weeks now. She was seen by pulmonary on 2/17 and started on prednisone and Augmentin. She has been using nebulizers at home. She is usually not on oxygen at baseline but has been requiring 2-3 L for the past couple of weeks. Despite these measures, her symptoms have not improved. She returned to the pulmonary office today and was evaluated and told to go to the ER to be admitted. In the ER she is noted to be wheezing. Oxygen saturation is about 95-97% on 3 L. He states that she becomes short of breath even with mild exertion. She has a cough and although she feels congested, cough is nonproductive. No complaint of fevers.    General: The patient denies anorexia, fever, unintentional weight loss Cardiac: Denies chest pain, syncope, palpitations, pedal edema  Respiratory: + cough, shortness of breath, wheezing GI: Denies severe indigestion/heartburn, abdominal pain, nausea, vomiting, diarrhea and constipation GU: Denies hematuria, incontinence, dysuria  Musculoskeletal: Denies arthritis  Skin: Denies suspicious skin lesions Neurologic: Denies focal weakness or numbness, change in vision Psychiatry: Denies depression or anxiety. Hematologic: no easy bruising or bleeding  All other systems reviewed and found to be negative.  Past Medical History  Diagnosis Date  . Diabetes ([place])     Type II  . Neuropathy ([place])     Diabetes  . COPD (chronic obstructive  pulmonary disease) ([place])   . Renal cancer ([place])     Left Kidney Removed  . Fibromyalgia   . Hepatitis C   . Abdominal mass of other site   . Chronic kidney disease, stage 3     Borderline Stage 2-3  . Depression   . Cervical compression fracture ([place])   . Hypercholesteremia   . Syncope   . Osteoarthritis   . Hypertension   . Hypothyroidism   . Chronic lower limb pain   . Morbid obesity ([place])   . CTS (carpal tunnel syndrome)   . RLS (restless legs syndrome)   . Venous stasis   . OSA (obstructive sleep apnea)     Past Surgical History  Procedure Laterality Date  . Nephrectomy      Left  . Umbilical hernia repair    . Abdominal hernia repair      x2  . Incisional hernia repair    . Abdominal hysterectomy    . Bladder suspension    . Vagina surgery    . Cholecystectomy    . Wisdom tooth extraction    . Tooth extraction    . Tonsillectomy    . Knee arthroscopy      Bilateral  . Cyst excision      Head    Social History: She smokes occasionally, does not drink alcohol Lives at home and manages ADLs well   Allergies  Allergen Reactions  . Gabapentin Anaphylaxis  . Lyrica [Pregabalin] Shortness Of Breath    Trouble breathing  . Ketorolac Tromethamine Hives  . Lisinopril Cough    Family history:  Family History  Problem Relation Age of Onset  . Heart attack Mother 54    Deceased  . Heart disease Mother   . Emphysema Mother   . COPD Father 90    Deceased  . Emphysema Father   . Alcoholism Father   . Alcoholism Mother   . Esophageal varices Father   . Alcoholism Paternal Grandfather   . Diabetes Maternal Grandmother   . Heart disease Maternal Grandmother   . Lung cancer Maternal Grandfather   . Emphysema Maternal Grandfather   . Brain cancer Maternal Aunt   . Diabetes Sister   . Heart defect Sister   . Obesity Son   . Breast cancer Maternal Aunt       Prior to Admission medications   Medication Sig Start Date End Date Taking? Authorizing  Provider  albuterol (PROVENTIL HFA;VENTOLIN HFA) 108 (90 Base) MCG/ACT inhaler Inhale 1-2 puffs into the lungs every 6 (six) hours as needed for wheezing. 06/18/15  Yes Tanna Furry, MD  albuterol (PROVENTIL) (2.5 MG/3ML) 0.083% nebulizer solution Take 3 mLs (2.5 mg total) by nebulization every 6 (six) hours as needed for wheezing or shortness of breath. 06/18/15  Yes Tanna Furry, MD  amoxicillin-clavulanate (AUGMENTIN) 875-125 MG tablet Take 1 tablet by mouth 2 (two) times daily. 07/04/15  Yes [name], PA-C  clotrimazole-betamethasone (LOTRISONE) cream Apply 1 application topically 2 (two) times daily. 06/18/15  Yes [name], PA-C  Fluticasone-Salmeterol (ADVAIR DISKUS) 500-50 MCG/DOSE AEPB Inhale 1 puff into the lungs once. 03/21/15  Yes [name], PA-C  furosemide (LASIX) 20 MG tablet Take 1 tablet (20 mg total) by mouth daily as needed. Patient taking differently: Take 20 mg by mouth daily as needed for fluid.  12/10/14  Yes [name], PA-C  insulin glargine (LANTUS) 100 UNIT/ML injection Inject 0.3 mLs (30 Units total) into the skin daily. 06/02/15  Yes [name], PA-C  insulin lispro (HUMALOG) 100 UNIT/ML injection Inject 0.15 mLs (15 Units total) into the skin 3 (three) times daily before meals. Patient taking differently: Inject 15 Units into the skin 3 (three) times daily as needed for high blood sugar.  02/07/15  Yes [name], PA-C  levothyroxine (SYNTHROID, LEVOTHROID) 75 MCG tablet Take 1 tablet (75 mcg total) by mouth daily before breakfast. 05/27/15  Yes [name], PA-C  nortriptyline (PAMELOR) 25 MG capsule Take 1 capsule (25 mg total) by mouth at bedtime. 06/18/15  Yes [name], PA-C  nortriptyline (PAMELOR) 75 MG capsule Take 1 capsule (75 mg total) by mouth at bedtime. 06/18/15  Yes [name], PA-C  nystatin (MYCOSTATIN/NYSTOP) 100000 UNIT/GM POWD Apply 1 Bottle topically 2 (two) times daily as needed (yeast infection).    Yes  Historical Provider, MD  Oxycodone HCl 10 MG TABS Take 1 tablet (10 mg total) by mouth every 8 (eight) hours as needed. 06/18/15  Yes [name], PA-C  pravastatin (PRAVACHOL) 20 MG tablet Take 1 tablet (20 mg total) by mouth daily. 04/28/15  Yes [name], PA-C  predniSONE (DELTASONE) 10 MG tablet Take 4 tablets by mouth x 3 days, then take 3 tablets x 3 days, then 2 tablets x 3 days, then 1 tablet x 3 days. 07/04/15  Yes [name], PA-C  sitaGLIPtin (JANUVIA) 50 MG tablet Take 1 tablet (50 mg total) by mouth daily. 10/07/14  Yes [name], PA-C  tiotropium (SPIRIVA) 18 MCG inhalation capsule Place 1 capsule (18 mcg total)  into inhaler and inhale daily. 06/03/15  Yes [name], PA-C  varenicline (CHANTIX STARTING MONTH PAK) 0.5 MG X 11 & 1 MG X 42 tablet Take one 0.5 mg tablet by mouth once daily for 3 days, then increase to one 0.5 mg tablet twice daily for 4 days, then increase to one 1 mg tablet twice daily. 07/04/15  Yes [name], PA-C  baclofen (LIORESAL) 10 MG tablet Take 10 mg by mouth 2 (two) times daily as needed for muscle spasms.  06/26/15   Historical Provider, MD  Insulin Syringe-Needle U-100 30G X 1/2" 1 ML MISC Use 4 times daily as directed. 04/30/14   [name], PA-C     Physical Exam: Filed Vitals:   07/11/15 1559 07/11/15 1720 07/11/15 1756 07/11/15 1824  BP: 158/89 164/90  161/97  Pulse: 113 107  104  Temp: 98.8 F (37.1 C)     TempSrc: Oral     Resp: 20 18  19   SpO2: 91% 92% 97% 97%     General: Awake alert oriented 3, mild respiratory distress HEENT: Normocephalic and Atraumatic, Mucous membranes pink                PERRLA; EOM intact; No scleral icterus,                 Nares: Patent, Oropharynx: Clear, Fair Dentition                 Neck: FROM, no cervical lymphadenopathy, thyromegaly, carotid bruit or JVD;  Breasts: deferred CHEST WALL: No tenderness  CHEST: Normal respiration, wheezing and rhonchi bilaterally, mildly  tachypnea but able to complete full sentences  HEART: Regular rate and rhythm; no murmurs rubs or gallops  BACK: No kyphosis or scoliosis; no CVA tenderness  GI: Positive Bowel Sounds, soft, non-tender; no masses, no organomegaly Rectal Exam: deferred MSK: No cyanosis, clubbing, +2 pitting edema with chronic skin changes including discoloration and thickening Genitalia: not examined  SKIN:  no rash or ulceration  CNS: Alert and Oriented x 4, Nonfocal exam, CN 2-12 intact  Labs on Admission:  Basic Metabolic Panel:  Recent Labs Lab 07/11/15 1724  NA 142  K 5.0  CL 92*  CO2 43*  GLUCOSE 235*  BUN 21*  CREATININE 1.02*  CALCIUM 9.0   Liver Function Tests: No results for input(s): AST, ALT, ALKPHOS, BILITOT, PROT, ALBUMIN in the last 168 hours. No results for input(s): LIPASE, AMYLASE in the last 168 hours. No results for input(s): AMMONIA in the last 168 hours. CBC: No results for input(s): WBC, NEUTROABS, HGB, HCT, MCV, PLT in the last 168 hours. Cardiac Enzymes:  Recent Labs Lab 07/11/15 1724  TROPONINI <0.03    BNP (last 3 results)  Recent Labs  07/11/15 1724  BNP 143.1*    ProBNP (last 3 results) No results for input(s): PROBNP in the last 8760 hours.  CBG: No results for input(s): GLUCAP in the last 168 hours.  Radiological Exams on Admission: Dg Chest 2 View  07/11/2015  CLINICAL DATA:  Shortness of breath, cough and congestion for 2 weeks, hypertension, diabetes mellitus, smoker EXAM: CHEST  2 VIEW COMPARISON:  07/04/2015 ; correlation CTA chest 06/20/2013 FINDINGS: Enlargement of cardiac silhouette with slight pulmonary vascular congestion Mediastinal contours normal. 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 Peribronchial thickening without gross infiltrate, pleural effusion or pneumothorax. Scattered endplate spur formation thoracic spine. IMPRESSION: Enlargement of cardiac silhouette with pulmonary vascular  congestion. Bronchitic changes without infiltrate. LEFT diaphragmatic hernia, stable. Electronically Signed   By: Lavonia Dana M.D.   On: 07/11/2015 16:29    EKG: Independently reviewed. Sinus tachycardia   Assessment/Plan Principal Problem:   COPD exacerbation-acute respiratory failure- COPD GOLDIII  -Chest x-ray reveals bronchitic changes without any infiltrate-there is possibly some pulmonary vascular congestion-BNP only 143-no history of congestive heart failure --Have started IV solumedrol, nebulizer treatments, Mucinex - continue O2 to keep pulse ox > 95%- wean as able -Doppler precautions-Check influenza swab and respiratory viral panel -Started on doxycycline which I will continue -Symptoms don't improve by tomorrow, we'll consider obtaining a pulmonary consult and possibly a CT of the chest  Active Problems:      Diabetes mellitus, type II, insulin dependent  -Continue Lantus-sliding scale NovoLog with meals and at bedtime ordered -Tradjenta ordered in place of Januvia  Mild AKI - Appears to be dehydrated-hold Lasix   Pedal edema/venous stasis -She takes Lasix as needed-BUN/creatinine ratio slightly elevated therefore we will hold off on starting Lasix  Chronic hepatitis C  Hypothyroidism ---Continue Synthroid  Morbid obesity -She states she has been attempting to lose weight   Consulted: None  Code Status: Full code  Family Communication:   DVT Prophylaxis: Lovenox  Time spent: 75 minutes  [place], MD [facility]  If 7PM-7AM, please contact night-coverage [email or web address] 07/11/2015, 6:53 PM

## 2015-07-11 NOTE — Patient Instructions (Signed)
Please continue medications as directed. Continue o2 as directed.  Go to appointment with Rexene Edison today at [facility].

## 2015-07-12 DIAGNOSIS — D696 Thrombocytopenia, unspecified: Secondary | ICD-10-CM | POA: Diagnosis present

## 2015-07-12 DIAGNOSIS — J209 Acute bronchitis, unspecified: Secondary | ICD-10-CM | POA: Diagnosis present

## 2015-07-12 DIAGNOSIS — J440 Chronic obstructive pulmonary disease with acute lower respiratory infection: Secondary | ICD-10-CM

## 2015-07-12 DIAGNOSIS — E039 Hypothyroidism, unspecified: Secondary | ICD-10-CM

## 2015-07-12 LAB — BASIC METABOLIC PANEL
ANION GAP: 9 (ref 5–15)
BUN: 20 mg/dL (ref 6–20)
CALCIUM: 9 mg/dL (ref 8.9–10.3)
CO2: 40 mmol/L — AB (ref 22–32)
Chloride: 92 mmol/L — ABNORMAL LOW (ref 101–111)
Creatinine, Ser: 0.96 mg/dL (ref 0.44–1.00)
GFR calc non Af Amer: >60 mL/min (ref >60)
GLUCOSE: 180 mg/dL — AB (ref 65–99)
POTASSIUM: 4.6 mmol/L (ref 3.5–5.1)
SODIUM: 141 mmol/L (ref 135–145)

## 2015-07-12 LAB — CBC
HCT: 45.2 % (ref 36.0–46.0)
HEMOGLOBIN: 12.9 g/dL (ref 12.0–15.0)
MCH: 26.2 pg (ref 26.0–34.0)
MCHC: 28.5 g/dL — AB (ref 30.0–36.0)
MCV: 91.9 fL (ref 78.0–100.0)
Platelets: 62 10*3/uL — ABNORMAL LOW (ref 150–400)
RBC: 4.92 MIL/uL (ref 3.87–5.11)
RDW: 16.9 % — ABNORMAL HIGH (ref 11.5–15.5)
WBC: 5.4 10*3/uL (ref 4.0–10.5)

## 2015-07-12 LAB — INFLUENZA PANEL BY PCR (TYPE A & B)
H1N1 flu by pcr: NOT DETECTED
INFLAPCR: NEGATIVE
Influenza B By PCR: NEGATIVE

## 2015-07-12 LAB — MRSA PCR SCREENING: MRSA BY PCR: NEGATIVE

## 2015-07-12 MED ORDER — CHLORHEXIDINE GLUCONATE 0.12 % MT SOLN
15.0000 mL | Freq: Two times a day (BID) | OROMUCOSAL | Status: DC
  Administered 2015-07-12 – 2015-07-17 (×9): 15 mL via OROMUCOSAL
  Filled 2015-07-12 (×11): qty 15

## 2015-07-12 MED ORDER — PANTOPRAZOLE SODIUM 40 MG PO TBEC
40.0000 mg | DELAYED_RELEASE_TABLET | Freq: Every day | ORAL | Status: DC
  Administered 2015-07-12 – 2015-07-17 (×6): 40 mg via ORAL
  Filled 2015-07-12 (×8): qty 1

## 2015-07-12 MED ORDER — HYDROCODONE-HOMATROPINE 5-1.5 MG/5ML PO SYRP: 5.0000 mL | ORAL_SOLUTION | Freq: Four times a day (QID) | ORAL | Status: DC | PRN

## 2015-07-12 MED ORDER — LEVALBUTEROL HCL 0.63 MG/3ML IN NEBU
0.6300 mg | INHALATION_SOLUTION | Freq: Four times a day (QID) | RESPIRATORY_TRACT | Status: DC
  Administered 2015-07-12 – 2015-07-14 (×8): 0.63 mg via RESPIRATORY_TRACT
  Filled 2015-07-12 (×8): qty 3

## 2015-07-12 MED ORDER — CETYLPYRIDINIUM CHLORIDE 0.05 % MT LIQD
7.0000 mL | Freq: Two times a day (BID) | OROMUCOSAL | Status: DC
  Administered 2015-07-15 – 2015-07-16 (×4): 7 mL via OROMUCOSAL

## 2015-07-12 MED ORDER — LORATADINE 10 MG PO TABS
10.0000 mg | ORAL_TABLET | Freq: Every day | ORAL | Status: DC
  Administered 2015-07-12 – 2015-07-17 (×6): 10 mg via ORAL
  Filled 2015-07-12 (×6): qty 1

## 2015-07-12 MED ORDER — ARFORMOTEROL TARTRATE 15 MCG/2ML IN NEBU
15.0000 ug | INHALATION_SOLUTION | Freq: Two times a day (BID) | RESPIRATORY_TRACT | Status: DC
  Administered 2015-07-12 – 2015-07-14 (×4): 15 ug via RESPIRATORY_TRACT
  Filled 2015-07-12 (×4): qty 2

## 2015-07-12 MED ORDER — OSELTAMIVIR PHOSPHATE 75 MG PO CAPS
75.0000 mg | ORAL_CAPSULE | Freq: Two times a day (BID) | ORAL | Status: DC
  Administered 2015-07-12 – 2015-07-15 (×8): 75 mg via ORAL
  Filled 2015-07-12 (×10): qty 1

## 2015-07-12 MED ORDER — IPRATROPIUM BROMIDE 0.02 % IN SOLN
0.5000 mg | Freq: Four times a day (QID) | RESPIRATORY_TRACT | Status: DC
  Administered 2015-07-12 – 2015-07-14 (×8): 0.5 mg via RESPIRATORY_TRACT
  Filled 2015-07-12 (×8): qty 2.5

## 2015-07-12 MED ORDER — IPRATROPIUM BROMIDE 0.02 % IN SOLN: 0.5000 mg | RESPIRATORY_TRACT | Status: DC | PRN

## 2015-07-12 MED ORDER — METOPROLOL TARTRATE 1 MG/ML IV SOLN
5.0000 mg | Freq: Once | INTRAVENOUS | Status: AC
  Administered 2015-07-12: 5 mg via INTRAVENOUS
  Filled 2015-07-12: qty 5

## 2015-07-12 MED ORDER — LEVALBUTEROL HCL 0.63 MG/3ML IN NEBU: 0.6300 mg | INHALATION_SOLUTION | RESPIRATORY_TRACT | Status: DC | PRN

## 2015-07-12 MED ORDER — INSULIN ASPART 100 UNIT/ML ~~LOC~~ SOLN
0.0000 [IU] | Freq: Three times a day (TID) | SUBCUTANEOUS | Status: DC
  Administered 2015-07-13: 7 [IU] via SUBCUTANEOUS
  Administered 2015-07-13 (×2): 4 [IU] via SUBCUTANEOUS
  Administered 2015-07-14: 3 [IU] via SUBCUTANEOUS
  Administered 2015-07-14 (×2): 4 [IU] via SUBCUTANEOUS
  Administered 2015-07-15: 2 [IU] via SUBCUTANEOUS
  Administered 2015-07-15: 3 [IU] via SUBCUTANEOUS
  Administered 2015-07-15: 4 [IU] via SUBCUTANEOUS

## 2015-07-12 MED ORDER — INSULIN ASPART 100 UNIT/ML ~~LOC~~ SOLN
0.0000 [IU] | Freq: Every day | SUBCUTANEOUS | Status: DC
  Administered 2015-07-12 – 2015-07-14 (×2): 2 [IU] via SUBCUTANEOUS

## 2015-07-12 MED ORDER — CHLORHEXIDINE GLUCONATE 0.12 % MT SOLN
OROMUCOSAL | Status: AC
Start: 2015-07-12 — End: 2015-07-12
  Filled 2015-07-12: qty 15

## 2015-07-12 MED ORDER — HYDRALAZINE HCL 20 MG/ML IJ SOLN
5.0000 mg | Freq: Four times a day (QID) | INTRAMUSCULAR | Status: DC | PRN
  Administered 2015-07-12 – 2015-07-13 (×2): 5 mg via INTRAVENOUS
  Filled 2015-07-12 (×2): qty 1

## 2015-07-12 NOTE — Progress Notes (Signed)
Utilization review completed.  

## 2015-07-12 NOTE — Progress Notes (Signed)
[facility] PROGRESS NOTE  Renee Noble [id number] DOB: [birth year] DOA: 07/11/2015 PCP: [name], PA-C  Assessment/Plan: #1 acute respiratory failure with hypoxia secondary to acute COPD exacerbation with bronchitis Patient with clinical improvement per patient. Patient still with expiratory wheezing with poor to fair air movement. Patient speaking in full sentences. Chest x-ray on admission negative for any acute infiltrate. A respiratory viral panel pending. Continue Pulmicort, doxycycline, IV Solu-Medrol, oxygen, Mucinex. Discontinue Spiriva. Place on [place]. Place on scheduled Xopenex and Atrovent nebs. Discontinue albuterol nebs due to tachycardia. Add Claritin and the PPI. Follow. If patient's condition worsens may need to get a CT scan of her chest and consult with pulmonary.  #2 hypothyroidism Continue Synthroid.  #3 diabetes mellitus type 2 insulin-dependent Hemoglobin A1c was 6.5 on 01/15/2015. Check a hemoglobin A1c. Continue TriGen tach, Lantus, sliding scale insulin.  #4 hyperlipidemia Check a fasting lipid panel. Continue Pravachol.  #5 tobacco abuse Tobacco cessation. Continue Chantix.  #6 mild acute kidney injury Resolved. Continue to hold Lasix.  #7 chronic venous stasis changes/pedal edema Patient takes Lasix as needed. BUN/creatinine ratio was slightly elevated on admission and a such Lasix on hold.  #8 chronic hepatitis C  #9 morbid obesity  #10 thrombocytopenia Likely secondary to acute infection and possibly secondary to recent antibiotic. Patient with no overt bleeding. Discontinue Lovenox and place on SCDs.  #11 prophylaxis PPI for GI prophylaxis. SCDs for DVT prophylaxis.  Code Status: Full Family Communication: Updated patient. No family present. Disposition Plan: Remain in the stepdown unit.   Consultants:  None  Procedures:  Chest x-ray 07/11/2015  Antibiotics:  Doxycycline 07/11/2015  HPI/Subjective: Patient  sitting up with the side of the bed eating breakfast. Patient states she's feeling better than on admission. Patient feels she has a lot of congestion that she cannot get up from her throat. Some improvement with shortness of breath. No chest pain.  Objective: Filed Vitals:   07/12/15 0800 07/12/15 0840  BP: 155/80   Pulse: 92 94  Temp:    Resp: 19 13    Intake/Output Summary (Last 24 hours) at 07/12/15 0916 Last data filed at 07/12/15 0800  Gross per 24 hour  Intake    370 ml  Output    400 ml  Net    -30 ml   Filed Weights   07/12/15 0030  Weight: 138.9 kg (306 lb 3.5 oz)    Exam:   General:  Sitting up on side of bed. Speaking in full sentences.  Cardiovascular: Tachycardic  Respiratory: Expiratory wheezing. Poor to fair air movement.  Abdomen: Obese, soft, nontender, positive left upper abdominal hernia, positive bowel sounds.  Musculoskeletal: No clubbing or cyanosis. Bilateral chronic venous stasis changes with 1+ edema.  Data Reviewed: Basic Metabolic Panel:  Recent Labs Lab 07/11/15 1724 07/11/15 1943 07/12/15 0339  NA 142  --  141  K 5.0  --  4.6  CL 92*  --  92*  CO2 43*  --  40*  GLUCOSE 235*  --  180*  BUN 21*  --  20  CREATININE 1.02* 1.14* 0.96  CALCIUM 9.0  --  9.0   Liver Function Tests: No results for input(s): AST, ALT, ALKPHOS, BILITOT, PROT, ALBUMIN in the last 168 hours. No results for input(s): LIPASE, AMYLASE in the last 168 hours. No results for input(s): AMMONIA in the last 168 hours. CBC:  Recent Labs Lab 07/11/15 1859 07/12/15 0339  WBC 6.0 5.4  NEUTROABS 5.3  --  HGB 14.0 12.9  HCT 50.0* 45.2  MCV 93.5 91.9  PLT 65* 62*   Cardiac Enzymes:  Recent Labs Lab 07/11/15 1724  TROPONINI <0.03   BNP (last 3 results)  Recent Labs  07/11/15 1724  BNP 143.1*    ProBNP (last 3 results) No results for input(s): PROBNP in the last 8760 hours.  CBG:  Recent Labs Lab 07/11/15 2203  GLUCAP 219*    Recent  Results (from the past 240 hour(s))  MRSA PCR Screening     Status: None   Collection Time: 07/11/15 11:30 PM  Result Value Ref Range Status   MRSA by PCR NEGATIVE NEGATIVE Final    Comment:        The GeneXpert MRSA Assay (FDA approved for NASAL specimens only), is one component of a comprehensive MRSA colonization surveillance program. It is not intended to diagnose MRSA infection nor to guide or monitor treatment for MRSA infections.      Studies: Dg Chest 2 View  07/11/2015  CLINICAL DATA:  Shortness of breath, cough and congestion for 2 weeks, hypertension, diabetes mellitus, smoker EXAM: CHEST  2 VIEW COMPARISON:  07/04/2015 ; correlation CTA chest 06/20/2013 FINDINGS: Enlargement of cardiac silhouette with slight pulmonary vascular congestion Mediastinal contours normal. 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 Peribronchial thickening without gross infiltrate, pleural effusion or pneumothorax. Scattered endplate spur formation thoracic spine. IMPRESSION: Enlargement of cardiac silhouette with pulmonary vascular congestion. Bronchitic changes without infiltrate. LEFT diaphragmatic hernia, stable. Electronically Signed   By: Lavonia Dana M.D.   On: 07/11/2015 16:29    Scheduled Meds: . albuterol  2.5 mg Nebulization QID  . antiseptic oral rinse  7 mL Mouth Rinse q12n4p  . budesonide (PULMICORT) nebulizer solution  0.25 mg Nebulization BID  . chlorhexidine  15 mL Mouth Rinse BID  . doxycycline  100 mg Oral Q12H  . enoxaparin (LOVENOX) injection  70 mg Subcutaneous Q24H  . guaiFENesin  1,200 mg Oral BID  . insulin aspart  0-15 Units Subcutaneous TID WC  . insulin aspart  0-5 Units Subcutaneous QHS  . insulin glargine  30 Units Subcutaneous Daily  . levothyroxine  75 mcg Oral QHS  . linagliptin  5 mg Oral Daily  . methylPREDNISolone (SOLU-MEDROL) injection  80 mg Intravenous Q12H  . nortriptyline  100 mg Oral QHS  . pravastatin   20 mg Oral Daily  . sodium chloride flush  3 mL Intravenous Q12H  . tiotropium  18 mcg Inhalation Daily  . varenicline  0.5 mg Oral Daily   Followed by  . [START ON 07/13/2015] varenicline  0.5 mg Oral BID   Followed by  . [START ON 07/17/2015] varenicline  1 mg Oral BID   Continuous Infusions:   Principal Problem:   Respiratory failure, acute (HCC) Active Problems:   COPD exacerbation (HCC)   COPD GOLDIII with min reversibility    Chronic hepatitis C without hepatic coma (HCC)   Diabetes mellitus, type II, insulin dependent (HCC)   Thyroid activity decreased   Nicotine abuse   AKI (acute kidney injury) ([place])   COPD (chronic obstructive pulmonary disease) with acute bronchitis ([place])   Thrombocytopenia ([place])    Time spent: [facility] Pager [phone]. If 7PM-7AM, please contact night-coverage at [email or web address], password [facility] 07/12/2015, 9:16 AM  LOS: 1 day

## 2015-07-12 NOTE — Evaluation (Addendum)
Physical Therapy Evaluation Patient Details Name: Renee Noble MRN: [id number] DOB: 15-Aug-1955 Today's Date: 07/12/2015   History of Present Illness  admitted with resp distress 07/11/15  Clinical Impression  Patient's BP with cuff on forearm consistently read > [id number] diastolic. RN aware and went in to recheck. Patient is very mobile, just with DOE. Patient will benefit from PT while in acute care to return to independent level..    Follow Up Recommendations No PT follow up    Equipment Recommendations  None recommended by PT    Recommendations for Other Services       Precautions / Restrictions Precautions Precaution Comments: on O2       Mobility  Bed Mobility               General bed mobility comments: in recliner  Transfers Overall transfer level: Needs assistance Equipment used: None Transfers: Sit to/from Stand Sit to Stand: Independent            Ambulation/Gait Ambulation/Gait assistance: Supervision Ambulation Distance (Feet): 40 Feet Assistive device: None Gait Pattern/deviations: Step-through pattern     General Gait Details: patient is mobilizing well without asisstance. HR 100 sats 87 % 4 l.  Stairs            Wheelchair Mobility    Modified Rankin (Stroke Patients Only)       Balance                                             Pertinent Vitals/Pain Pain Assessment: No/denies pain    Home Living Family/patient expects to be discharged to:: Private residence Living Arrangements: Other relatives Available Help at Discharge: Family Type of Home: House Home Access: Stairs to enter   Technical brewer of Steps: 2 Home Layout: One level Home Equipment: Environmental consultant - 2 wheels;Cane - single point      Prior Function Level of Independence: Independent               Hand Dominance        Extremity/Trunk Assessment   Upper Extremity Assessment: Overall WFL for tasks assessed            Lower Extremity Assessment: Overall WFL for tasks assessed      Cervical / Trunk Assessment: Normal  Communication   Communication: No difficulties  Cognition Arousal/Alertness: Awake/alert Behavior During Therapy: WFL for tasks assessed/performed Overall Cognitive Status: Within Functional Limits for tasks assessed                      General Comments      Exercises        Assessment/Plan    PT Assessment Patient needs continued PT services  PT Diagnosis Difficulty walking   PT Problem List Cardiopulmonary status limiting activity;Decreased activity tolerance  PT Treatment Interventions DME instruction;Gait training;Functional mobility training;Therapeutic activities;Patient/family education   PT Goals (Current goals can be found in the Care Plan section) Acute Rehab PT Goals Patient Stated Goal: to go home PT Goal Formulation: With patient Time For Goal Achievement: 07/26/15 Potential to Achieve Goals: Good    Frequency Min 3X/week   Barriers to discharge        Co-evaluation               End of Session   Activity Tolerance: Patient tolerated treatment well Patient left: in  chair;with call bell/phone within reach Nurse Communication: Mobility status         Time: [id number] PT Time Calculation (min) (ACUTE ONLY): 27 min   Charges:   PT Evaluation $PT Eval Low Complexity: 1 Procedure PT Treatments $Gait Training: 8-22 mins   PT G Codes:        Claretha Cooper 07/12/2015, 4:55 PM Tresa Endo PT [phone]

## 2015-07-13 ENCOUNTER — Inpatient Hospital Stay (HOSPITAL_COMMUNITY): Payer: Medicare Other

## 2015-07-13 ENCOUNTER — Encounter (HOSPITAL_COMMUNITY): Payer: Self-pay | Admitting: Radiology

## 2015-07-13 DIAGNOSIS — R0902 Hypoxemia: Secondary | ICD-10-CM

## 2015-07-13 LAB — BASIC METABOLIC PANEL
ANION GAP: 8 (ref 5–15)
BUN: 31 mg/dL — AB (ref 6–20)
CALCIUM: 9 mg/dL (ref 8.9–10.3)
CO2: 39 mmol/L — ABNORMAL HIGH (ref 22–32)
Chloride: 90 mmol/L — ABNORMAL LOW (ref 101–111)
Creatinine, Ser: 0.92 mg/dL (ref 0.44–1.00)
GFR calc Af Amer: >60 mL/min (ref >60)
GLUCOSE: 175 mg/dL — AB (ref 65–99)
Potassium: 5.5 mmol/L — ABNORMAL HIGH (ref 3.5–5.1)
Sodium: 137 mmol/L (ref 135–145)

## 2015-07-13 LAB — CBC
HCT: 44 % (ref 36.0–46.0)
Hemoglobin: 12.6 g/dL (ref 12.0–15.0)
MCH: 26.1 pg (ref 26.0–34.0)
MCHC: 28.6 g/dL — ABNORMAL LOW (ref 30.0–36.0)
MCV: 91.1 fL (ref 78.0–100.0)
PLATELETS: 70 10*3/uL — AB (ref 150–400)
RBC: 4.83 MIL/uL (ref 3.87–5.11)
RDW: 16.7 % — AB (ref 11.5–15.5)
WBC: 6.7 10*3/uL (ref 4.0–10.5)

## 2015-07-13 LAB — LIPID PANEL
Cholesterol: 154 mg/dL (ref 0–200)
HDL: 62 mg/dL (ref >40)
LDL CALC: 80 mg/dL (ref 0–99)
Total CHOL/HDL Ratio: 2.5 RATIO
Triglycerides: 60 mg/dL (ref <150)
VLDL: 12 mg/dL (ref 0–40)

## 2015-07-13 LAB — GLUCOSE, CAPILLARY
Glucose-Capillary: 207 mg/dL — ABNORMAL HIGH (ref 65–99)
Glucose-Capillary: 201 mg/dL — ABNORMAL HIGH (ref 65–99)

## 2015-07-13 LAB — MAGNESIUM: Magnesium: 1.7 mg/dL (ref 1.7–2.4)

## 2015-07-13 LAB — POTASSIUM: POTASSIUM: 5.2 mmol/L — AB (ref 3.5–5.1)

## 2015-07-13 LAB — TSH: TSH: 0.642 u[IU]/mL (ref 0.350–4.500)

## 2015-07-13 MED ORDER — MAGNESIUM SULFATE 2 GM/50ML IV SOLN
2.0000 g | Freq: Once | INTRAVENOUS | Status: AC
  Administered 2015-07-13: 2 g via INTRAVENOUS
  Filled 2015-07-13: qty 50

## 2015-07-13 MED ORDER — AMLODIPINE BESYLATE 10 MG PO TABS
10.0000 mg | ORAL_TABLET | Freq: Every day | ORAL | Status: DC
  Administered 2015-07-13 – 2015-07-17 (×5): 10 mg via ORAL
  Filled 2015-07-13 (×5): qty 1

## 2015-07-13 MED ORDER — OXYCODONE HCL 5 MG PO TABS
10.0000 mg | ORAL_TABLET | Freq: Four times a day (QID) | ORAL | Status: DC | PRN
  Administered 2015-07-13 – 2015-07-17 (×11): 10 mg via ORAL
  Filled 2015-07-13 (×11): qty 2

## 2015-07-13 MED ORDER — IOHEXOL 350 MG/ML SOLN
100.0000 mL | Freq: Once | INTRAVENOUS | Status: AC | PRN
  Administered 2015-07-13: 100 mL via INTRAVENOUS

## 2015-07-13 NOTE — Assessment & Plan Note (Signed)
Unchanged despite two rounds of steroids. Duoneb given in office without marked change in o2. Discussed ER versus Hospital Admission. Patient felt that at last ER visit she was not listened to and shipped out. Have personally had issues with direct admission recently. Consulted Pulmonology who agrees to assess patient today in office for admission by Pulmonary service. Appointment scheduled this afternoon. Patient and driver encouraged to go to ER if anything worsens before assessment.

## 2015-07-13 NOTE — Progress Notes (Signed)
[facility] PROGRESS NOTE  Renee Noble [id number] DOB: 1955-08-17 DOA: 07/11/2015 PCP: [name], PA-C  Assessment/Plan: #1 acute respiratory failure with hypoxia secondary to acute COPD exacerbation with bronchitis Patient with clinical improvement per patient. Patient still with expiratory wheezing with poor to fair air movement. Patient speaking in full sentences. Patient states significantly hypoxic on minimal exertion.Chest x-ray on admission negative for any acute infiltrate. A respiratory viral panel pending. Continue Pulmicort, doxycycline, IV Solu-Medrol, oxygen, Mucinex. Discontinued Spiriva. Continue [place], Xopenex and Atrovent nebs, claritin,PPI. Check a CT chest as patient with significant hypoxia on minimal exertion and consult with critical care for further evaluation and management..   #2 hypothyroidism  TSH was 0.642.Continue Synthroid.  #3 diabetes mellitus type 2 insulin-dependent Hemoglobin A1c was 6.5 on 01/15/2015. Check a hemoglobin A1c. Continue Trajenta, Lantus, sliding scale insulin.  #4 hyperlipidemia  fasting lipid panel with LDL of 80. Continue Pravachol.  #5 tobacco abuse Tobacco cessation. Continue Chantix.  #6 mild acute kidney injury Resolved. Continue to hold Lasix.  #7 chronic venous stasis changes/pedal edema Patient takes Lasix as needed. BUN/creatinine ratio was slightly elevated on admission and a such Lasix on hold.  #8 chronic hepatitis C   #9  Elevated blood pressure  Patient states does not have a history of hypertension. Will place on Norvasc daily. Hydralazine as needed.  #10 morbid obesity  #11 thrombocytopenia Likely secondary to acute infection and possibly secondary to recent antibiotic.  Improving.Patient with no overt bleeding. Discontinued Lovenox and place on SCDs.  #12 prophylaxis PPI for GI prophylaxis. SCDs for DVT prophylaxis.  Code Status: Full Family Communication: Updated patient. No family  present. Disposition Plan: Remain in the stepdown unit.   Consultants:  None  Procedures:  Chest x-ray 07/11/2015  Antibiotics:  Doxycycline 07/11/2015  HPI/Subjective: Patient states she is feeling better than on admission. Patient denies CP. Patient states significantly hypoxic on minimal exertion with sats in low 80s.  Objective: Filed Vitals:   07/13/15 0800 07/13/15 0805  BP:  151/93  Pulse:  95  Temp: 97.6 F (36.4 C) 97.6 F (36.4 C)  Resp:  22    Intake/Output Summary (Last 24 hours) at 07/13/15 1058 Last data filed at 07/13/15 0800  Gross per 24 hour  Intake    250 ml  Output   2300 ml  Net  -2050 ml   Filed Weights   07/12/15 0030 07/13/15 0400  Weight: 138.9 kg (306 lb 3.5 oz) 139.6 kg (307 lb 12.2 oz)    Exam:   General:  Speaking in full sentences.  Cardiovascular: RRR  Respiratory: Expiratory wheezing. Poor to fair air movement.  Abdomen: Obese, soft, nontender, positive left upper abdominal hernia, positive bowel sounds.  Musculoskeletal: No clubbing or cyanosis. Bilateral chronic venous stasis changes with 1+ edema.  Data Reviewed: Basic Metabolic Panel:  Recent Labs Lab 07/11/15 1724 07/11/15 1943 07/12/15 0339 07/13/15 0326  NA 142  --  141 137  K 5.0  --  4.6 5.5*  CL 92*  --  92* 90*  CO2 43*  --  40* 39*  GLUCOSE 235*  --  180* 175*  BUN 21*  --  20 31*  CREATININE 1.02* 1.14* 0.96 0.92  CALCIUM 9.0  --  9.0 9.0  MG  --   --   --  1.7   Liver Function Tests: No results for input(s): AST, ALT, ALKPHOS, BILITOT, PROT, ALBUMIN in the last 168 hours. No results for input(s): LIPASE, AMYLASE in  the last 168 hours. No results for input(s): AMMONIA in the last 168 hours. CBC:  Recent Labs Lab 07/11/15 1859 07/12/15 0339 07/13/15 0326  WBC 6.0 5.4 6.7  NEUTROABS 5.3  --   --   HGB 14.0 12.9 12.6  HCT 50.0* 45.2 44.0  MCV 93.5 91.9 91.1  PLT 65* 62* 70*   Cardiac Enzymes:  Recent Labs Lab 07/11/15 1724   TROPONINI <0.03   BNP (last 3 results)  Recent Labs  07/11/15 1724  BNP 143.1*    ProBNP (last 3 results) No results for input(s): PROBNP in the last 8760 hours.  CBG:  Recent Labs Lab 07/11/15 2203 07/12/15 2210  GLUCAP 219* 207*    Recent Results (from the past 240 hour(s))  MRSA PCR Screening     Status: None   Collection Time: 07/11/15 11:30 PM  Result Value Ref Range Status   MRSA by PCR NEGATIVE NEGATIVE Final    Comment:        The GeneXpert MRSA Assay (FDA approved for NASAL specimens only), is one component of a comprehensive MRSA colonization surveillance program. It is not intended to diagnose MRSA infection nor to guide or monitor treatment for MRSA infections.      Studies: Dg Chest 2 View  07/11/2015  CLINICAL DATA:  Shortness of breath, cough and congestion for 2 weeks, hypertension, diabetes mellitus, smoker EXAM: CHEST  2 VIEW COMPARISON:  07/04/2015 ; correlation CTA chest 06/20/2013 FINDINGS: Enlargement of cardiac silhouette with slight pulmonary vascular congestion Mediastinal contours normal. 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 Peribronchial thickening without gross infiltrate, pleural effusion or pneumothorax. Scattered endplate spur formation thoracic spine. IMPRESSION: Enlargement of cardiac silhouette with pulmonary vascular congestion. Bronchitic changes without infiltrate. LEFT diaphragmatic hernia, stable. Electronically Signed   By: Lavonia Dana M.D.   On: 07/11/2015 16:29    Scheduled Meds: . antiseptic oral rinse  7 mL Mouth Rinse q12n4p  . arformoterol  15 mcg Nebulization BID  . budesonide (PULMICORT) nebulizer solution  0.25 mg Nebulization BID  . chlorhexidine  15 mL Mouth Rinse BID  . doxycycline  100 mg Oral Q12H  . guaiFENesin  1,200 mg Oral BID  . insulin aspart  0-20 Units Subcutaneous TID WC  . insulin aspart  0-5 Units Subcutaneous QHS  . insulin glargine  30 Units  Subcutaneous Daily  . ipratropium  0.5 mg Nebulization Q6H  . levalbuterol  0.63 mg Nebulization Q6H  . levothyroxine  75 mcg Oral QHS  . linagliptin  5 mg Oral Daily  . loratadine  10 mg Oral Daily  . methylPREDNISolone (SOLU-MEDROL) injection  80 mg Intravenous Q12H  . nortriptyline  100 mg Oral QHS  . oseltamivir  75 mg Oral BID  . pantoprazole  40 mg Oral Q0600  . pravastatin  20 mg Oral Daily  . sodium chloride flush  3 mL Intravenous Q12H  . varenicline  0.5 mg Oral BID   Followed by  . [START ON 07/17/2015] varenicline  1 mg Oral BID   Continuous Infusions:   Principal Problem:   Respiratory failure, acute (HCC) Active Problems:   COPD exacerbation (HCC)   COPD GOLDIII with min reversibility    Chronic hepatitis C without hepatic coma (HCC)   Diabetes mellitus, type II, insulin dependent (HCC)   Thyroid activity decreased   Nicotine abuse   AKI (acute kidney injury) ([place])   COPD (chronic obstructive pulmonary disease) with acute bronchitis ([place])  Thrombocytopenia ([place])   COPD with acute exacerbation ([place])    Time spent: Boyd MD [facility] Pager [phone]. If 7PM-7AM, please contact night-coverage at [email or web address], password [facility] 07/13/2015, 10:58 AM  LOS: 2 days

## 2015-07-13 NOTE — Progress Notes (Signed)
Nutrition Brief Note  RD consulted via COPD gold protocol.  Pt with recent weight gain but has had some pedal edema. Pt reports attempted weight loss. Eating well.   Wt Readings from Last 15 Encounters:  07/13/15 307 lb 12.2 oz (139.6 kg)  07/11/15 302 lb (136.986 kg)  07/04/15 308 lb 6.4 oz (139.889 kg)  06/25/15 295 lb (133.811 kg)  06/18/15 294 lb (133.358 kg)  06/18/15 294 lb 9.6 oz (133.63 kg)  01/15/15 293 lb 2 oz (132.961 kg)  09/02/14 291 lb 2 oz (132.053 kg)  07/30/14 310 lb 8 oz (140.842 kg)  05/31/14 293 lb 9.6 oz (133.176 kg)  04/30/14 295 lb 6 oz (133.981 kg)  02/14/14 300 lb (136.079 kg)  12/25/13 296 lb (134.265 kg)  12/06/13 305 lb 8 oz (138.574 kg)    Body mass index is 54.53 kg/(m^2). Patient meets criteria for morbid obesity based on current BMI.   Current diet order is CHO modified. Labs and medications reviewed.   No nutrition interventions warranted at this time. If nutrition issues arise, please consult RD.   Clayton Bibles, MS, RD, LDN Pager: [phone] After Hours Pager: [phone]

## 2015-07-14 ENCOUNTER — Encounter: Payer: Self-pay | Admitting: Physician Assistant

## 2015-07-14 ENCOUNTER — Inpatient Hospital Stay (HOSPITAL_COMMUNITY): Payer: Medicare Other

## 2015-07-14 DIAGNOSIS — J9601 Acute respiratory failure with hypoxia: Secondary | ICD-10-CM

## 2015-07-14 DIAGNOSIS — J441 Chronic obstructive pulmonary disease with (acute) exacerbation: Principal | ICD-10-CM

## 2015-07-14 DIAGNOSIS — R0600 Dyspnea, unspecified: Secondary | ICD-10-CM

## 2015-07-14 LAB — GLUCOSE, CAPILLARY
GLUCOSE-CAPILLARY: 162 mg/dL — AB (ref 65–99)
GLUCOSE-CAPILLARY: 126 mg/dL — AB (ref 65–99)
GLUCOSE-CAPILLARY: 165 mg/dL — AB (ref 65–99)
GLUCOSE-CAPILLARY: 178 mg/dL — AB (ref 65–99)
Glucose-Capillary: 177 mg/dL — ABNORMAL HIGH (ref 65–99)

## 2015-07-14 LAB — CBC
HEMATOCRIT: 43.7 % (ref 36.0–46.0)
HEMOGLOBIN: 13.1 g/dL (ref 12.0–15.0)
MCH: 26.5 pg (ref 26.0–34.0)
MCHC: 30 g/dL (ref 30.0–36.0)
MCV: 88.3 fL (ref 78.0–100.0)
Platelets: 65 10*3/uL — ABNORMAL LOW (ref 150–400)
RBC: 4.95 MIL/uL (ref 3.87–5.11)
RDW: 16.6 % — ABNORMAL HIGH (ref 11.5–15.5)
WBC: 7.1 10*3/uL (ref 4.0–10.5)

## 2015-07-14 LAB — BASIC METABOLIC PANEL
ANION GAP: 9 (ref 5–15)
BUN: 33 mg/dL — ABNORMAL HIGH (ref 6–20)
CHLORIDE: 90 mmol/L — AB (ref 101–111)
CO2: 38 mmol/L — AB (ref 22–32)
CREATININE: 0.95 mg/dL (ref 0.44–1.00)
Calcium: 9.2 mg/dL (ref 8.9–10.3)
GFR calc non Af Amer: >60 mL/min (ref >60)
Glucose, Bld: 185 mg/dL — ABNORMAL HIGH (ref 65–99)
POTASSIUM: 5.1 mmol/L (ref 3.5–5.1)
Sodium: 137 mmol/L (ref 135–145)

## 2015-07-14 LAB — HEMOGLOBIN A1C
Hgb A1c MFr Bld: 6.2 % — ABNORMAL HIGH (ref 4.8–5.6)
MEAN PLASMA GLUCOSE: 131 mg/dL

## 2015-07-14 LAB — MAGNESIUM: MAGNESIUM: 1.7 mg/dL (ref 1.7–2.4)

## 2015-07-14 MED ORDER — FUROSEMIDE 10 MG/ML IJ SOLN
40.0000 mg | Freq: Every day | INTRAMUSCULAR | Status: DC
  Administered 2015-07-14: 40 mg via INTRAVENOUS
  Filled 2015-07-14 (×2): qty 4

## 2015-07-14 MED ORDER — MAGNESIUM SULFATE 2 GM/50ML IV SOLN
2.0000 g | Freq: Once | INTRAVENOUS | Status: AC
  Administered 2015-07-14: 2 g via INTRAVENOUS
  Filled 2015-07-14: qty 50

## 2015-07-14 MED ORDER — IPRATROPIUM BROMIDE 0.02 % IN SOLN
0.5000 mg | RESPIRATORY_TRACT | Status: DC
  Administered 2015-07-14 – 2015-07-17 (×18): 0.5 mg via RESPIRATORY_TRACT
  Filled 2015-07-14 (×19): qty 2.5

## 2015-07-14 MED ORDER — PERFLUTREN LIPID MICROSPHERE
1.0000 mL | INTRAVENOUS | Status: AC | PRN
  Administered 2015-07-14: 2 mL via INTRAVENOUS
  Filled 2015-07-14: qty 10

## 2015-07-14 MED ORDER — LEVALBUTEROL HCL 0.63 MG/3ML IN NEBU
0.6300 mg | INHALATION_SOLUTION | RESPIRATORY_TRACT | Status: DC
  Administered 2015-07-14 – 2015-07-17 (×18): 0.63 mg via RESPIRATORY_TRACT
  Filled 2015-07-14 (×19): qty 3

## 2015-07-14 MED ORDER — NYSTATIN 100000 UNIT/GM EX POWD
1.0000 | Freq: Two times a day (BID) | CUTANEOUS | Status: DC | PRN
  Filled 2015-07-14: qty 15

## 2015-07-14 MED ORDER — BACLOFEN 10 MG PO TABS
10.0000 mg | ORAL_TABLET | Freq: Two times a day (BID) | ORAL | Status: DC | PRN
  Filled 2015-07-14: qty 1

## 2015-07-14 NOTE — Telephone Encounter (Signed)
charge 

## 2015-07-14 NOTE — Progress Notes (Signed)
Echocardiogram 2D Echocardiogram with Definity has been performed.  Tresa Res 07/14/2015, 3:53 PM

## 2015-07-14 NOTE — Consult Note (Addendum)
PULMONARY / CRITICAL CARE MEDICINE   Name: Renee Noble MRN: [id number] DOB: [birth year]    ADMISSION DATE:  07/11/2015 CONSULTATION DATE:  07/14/15  REFERRING MD:  Dr Irine Seal  CHIEF COMPLAINT:  Slow to resolve AECOPD  HISTORY OF PRESENT ILLNESS:   60 year old female with severe Gold stage III COPD based on 2015 pulmonary function test that showed FEV1 1.22 L/48% and DLCO 73%. According to the patient back in 2015 based on walking desaturation test in the office she was taken off oxygen. Review of the record shows that she was only walked 2 out of 3 customary laps 185 feet each and stopped when her pulse ox dropped to 89% because of chest tightness. Nevertheless she was taken off oxygen and according to history. Overall she rates her quality of life as 8 out of 10 [10 being excellent]. She was then admitted 07/11/2015 after having a 2 week history of worsening cough and chest tightness. In the pulmonary office on 07/11/2015 she was found desaturating to 84% requiring oxygen. Here in the hospital she's been treated by the hospitalist service with conventional COPD exacerbation management doxycycline and steroids [review the medication list shows that her home Lasix has been held]. Since admission she has improved the subject's score of 7 out of 10 at rest but she finds that she easily desaturates and gets very short of breath when she transfers out of bed and goes to the toilet. She says this is a new finding for her because at baseline she is able to walk a block and a half at home before she gets dyspneic. A CT Angio chest was done and it ruled out pulmonary embolism. Pulmonary critical care medicine has been consulted 07/14/2015 due to acute on chronic hypoxemic respiratory failure and slow to improve COPD exacerbation. s e is on empiric flu rx though nasal PCR flu is negative due to community outbreak  PAST MEDICAL HISTORY :  She  has a past medical history of Diabetes ([place]); Neuropathy  ([place]); COPD (chronic obstructive pulmonary disease) ([place]); Renal cancer ([place]); Fibromyalgia; Hepatitis C; Abdominal mass of other site; Chronic kidney disease, stage 3; Depression; Cervical compression fracture ([place]); Hypercholesteremia; Syncope; Osteoarthritis; Hypertension; Hypothyroidism; Chronic lower limb pain; Morbid obesity ([place]); CTS (carpal tunnel syndrome); RLS (restless legs syndrome); Venous stasis; and OSA (obstructive sleep apnea).  PAST SURGICAL HISTORY: She  has past surgical history that includes Nephrectomy; Umbilical hernia repair; Abdominal hernia repair; Incisional hernia repair; Abdominal hysterectomy; Bladder suspension; Vagina surgery; Cholecystectomy; Wisdom tooth extraction; Tooth extraction; Tonsillectomy; Knee arthroscopy; and Cyst excision.  Allergies  Allergen Reactions  . Gabapentin Anaphylaxis  . Lyrica [Pregabalin] Shortness Of Breath    Trouble breathing  . Ketorolac Tromethamine Hives  . Lisinopril Cough    No current facility-administered medications on file prior to encounter.   Current Outpatient Prescriptions on File Prior to Encounter  Medication Sig  . albuterol (PROVENTIL HFA;VENTOLIN HFA) 108 (90 Base) MCG/ACT inhaler Inhale 1-2 puffs into the lungs every 6 (six) hours as needed for wheezing.  [name] Kitchen albuterol (PROVENTIL) (2.5 MG/3ML) 0.083% nebulizer solution Take 3 mLs (2.5 mg total) by nebulization every 6 (six) hours as needed for wheezing or shortness of breath.  [name] Kitchen amoxicillin-clavulanate (AUGMENTIN) 875-125 MG tablet Take 1 tablet by mouth 2 (two) times daily.  . clotrimazole-betamethasone (LOTRISONE) cream Apply 1 application topically 2 (two) times daily.  . Fluticasone-Salmeterol (ADVAIR DISKUS) 500-50 MCG/DOSE AEPB Inhale 1 puff into the lungs once.  . furosemide (LASIX) 20  MG tablet Take 1 tablet (20 mg total) by mouth daily as needed. (Patient taking differently: Take 20 mg by mouth daily as needed for fluid. )  . insulin glargine (LANTUS) 100  UNIT/ML injection Inject 0.3 mLs (30 Units total) into the skin daily.  . insulin lispro (HUMALOG) 100 UNIT/ML injection Inject 0.15 mLs (15 Units total) into the skin 3 (three) times daily before meals. (Patient taking differently: Inject 15 Units into the skin 3 (three) times daily as needed for high blood sugar. )  . levothyroxine (SYNTHROID, LEVOTHROID) 75 MCG tablet Take 1 tablet (75 mcg total) by mouth daily before breakfast.  . nortriptyline (PAMELOR) 25 MG capsule Take 1 capsule (25 mg total) by mouth at bedtime.  . nortriptyline (PAMELOR) 75 MG capsule Take 1 capsule (75 mg total) by mouth at bedtime.  [name] Kitchen nystatin (MYCOSTATIN/NYSTOP) 100000 UNIT/GM POWD Apply 1 Bottle topically 2 (two) times daily as needed (yeast infection).   . Oxycodone HCl 10 MG TABS Take 1 tablet (10 mg total) by mouth every 8 (eight) hours as needed.  . pravastatin (PRAVACHOL) 20 MG tablet Take 1 tablet (20 mg total) by mouth daily.  . predniSONE (DELTASONE) 10 MG tablet Take 4 tablets by mouth x 3 days, then take 3 tablets x 3 days, then 2 tablets x 3 days, then 1 tablet x 3 days.  . sitaGLIPtin (JANUVIA) 50 MG tablet Take 1 tablet (50 mg total) by mouth daily.  [name] Kitchen tiotropium (SPIRIVA) 18 MCG inhalation capsule Place 1 capsule (18 mcg total) into inhaler and inhale daily.  . varenicline (CHANTIX STARTING MONTH PAK) 0.5 MG X 11 & 1 MG X 42 tablet Take one 0.5 mg tablet by mouth once daily for 3 days, then increase to one 0.5 mg tablet twice daily for 4 days, then increase to one 1 mg tablet twice daily.  . Insulin Syringe-Needle U-100 30G X 1/2" 1 ML MISC Use 4 times daily as directed.    FAMILY HISTORY:  Her has no family status information on file.   SOCIAL HISTORY: She  reports that she quit smoking about 21 months ago. Her smoking use included Cigarettes. She has a 112.5 pack-year smoking history. She has never used smokeless tobacco. She reports that she does not drink alcohol or use illicit drugs.  REVIEW OF  SYSTEMS:   According to history of present illness otherwise 11 point review of system is normal   VIAL SIGNS: BP 161/96 mmHg  Pulse 90  Temp(Src) 97.4 F (36.3 C) (Oral)  Resp 18  Ht 5\' 3"  (1.6 m)  Wt 139.6 kg (307 lb 12.2 oz)  BMI 54.53 kg/m2  SpO2 98%  HEMODYNAMICS:    VENTILATOR SETTINGS:    INTAKE / OUTPUT: I/O last 3 completed shifts: In: 1210 [P.O.:1200; I.V.:10] Out: 3800 [Urine:3800]  PHYSICAL EXAMINATION: General:  Obese lady seated comfortably in the bed Neuro:  Alert and oriented 3. Speech is normal. Glasgow Coma Scale 15. Chem ICU negative for delirium. Moves all 4 HEENT:  Obese neck. Mallampati class III-IV oxygen on. Cardiovascular:  Regular rate and rhythm no murmurs Lungs:  Overall diminished air entry due to obesity. No wheeze no crackles Abdomen:  Obese soft nontender Musculoskeletal:  Chronic bilateral 1-2 plus edema present Skin:  Intact in the exposed areas.  LABS:  PULMONARY No results for input(s): PHART, PCO2ART, PO2ART, HCO3, TCO2, O2SAT in the last 168 hours.  Invalid input(s): PCO2, PO2  CBC  Recent Labs Lab 07/12/15 0339 07/13/15 0326 07/14/15 0350  HGB 12.9 12.6 13.1  HCT 45.2 44.0 43.7  WBC 5.4 6.7 7.1  PLT 62* 70* 65*    COAGULATION No results for input(s): INR in the last 168 hours.  CARDIAC   Recent Labs Lab 07/11/15 1724  TROPONINI <0.03   No results for input(s): PROBNP in the last 168 hours.   CHEMISTRY  Recent Labs Lab 07/11/15 1724 07/11/15 1943 07/12/15 0339 07/13/15 0326 07/13/15 0815 07/14/15 0350  NA 142  --  141 137  --  137  K 5.0  --  4.6 5.5* 5.2* 5.1  CL 92*  --  92* 90*  --  90*  CO2 43*  --  40* 39*  --  38*  GLUCOSE 235*  --  180* 175*  --  185*  BUN 21*  --  20 31*  --  33*  CREATININE 1.02* 1.14* 0.96 0.92  --  0.95  CALCIUM 9.0  --  9.0 9.0  --  9.2  MG  --   --   --  1.7  --  1.7   Estimated Creatinine Clearance: 85.7 mL/min (by C-G formula based on Cr of  0.95).   LIVER No results for input(s): AST, ALT, ALKPHOS, BILITOT, PROT, ALBUMIN, INR in the last 168 hours.   INFECTIOUS No results for input(s): LATICACIDVEN, PROCALCITON in the last 168 hours.   ENDOCRINE CBG (last 3)   Recent Labs  07/12/15 2210 07/13/15 1151 07/13/15 2222  GLUCAP 207* 201* 162*         IMAGING x48h  - image(s) personally visualized  -   highlighted in bold Ct Angio Chest Pe W/cm &/or Wo Cm  07/13/2015  CLINICAL DATA:  Chronic worsening shortness of breath, with decreased O2 saturation. Initial encounter. EXAM: CT ANGIOGRAPHY CHEST WITH CONTRAST TECHNIQUE: Multidetector CT imaging of the chest was performed using the standard protocol during bolus administration of intravenous contrast. Multiplanar CT image reconstructions and MIPs were obtained to evaluate the vascular anatomy. CONTRAST:  126mL OMNIPAQUE IOHEXOL 350 MG/ML SOLN COMPARISON:  Chest radiograph performed 07/11/2015, and CTA of the chest performed 06/20/2013 FINDINGS: There is no evidence of pulmonary embolus. A trace left pleural effusion is noted, with mild left basilar opacity likely reflecting atelectasis. Mild scarring is noted at the posterior aspect of the right upper lobe. Minimal nonspecific peribronchovascular opacity within the right upper lobe may reflect chronic changes of infection. There is no evidence of pneumothorax. No masses are identified; no abnormal focal contrast enhancement is seen. Scattered calcification is noted at the mitral and aortic valves. Diffuse coronary artery calcifications are seen. Scattered calcification is noted along the proximal great vessels and aortic arch. No pericardial effusion is identified. No mediastinal lymphadenopathy is seen. No axillary lymphadenopathy is seen. The visualized portions of the thyroid gland are unremarkable in appearance. The spleen is diffusely enlarged, measuring up to 20.5 cm in length. The liver also appears mildly prominent. The  patient is status post median sternotomy, with evidence of prior CABG. The visualized portions of the pancreas, adrenal glands and right kidney are grossly unremarkable. No acute osseous abnormalities are seen. Review of the MIP images confirms the above findings. IMPRESSION: 1. No evidence of pulmonary embolus. 2. Minimal nonspecific peribronchial vascular opacity within the right upper lobe may reflect chronic changes of infection, new from 2015. 3. Trace left pleural effusion, with mild left basilar airspace opacity likely reflecting atelectasis. Mild scarring at the posterior aspect of the right upper lobe is stable in appearance. 4.  Diffuse coronary artery calcifications seen. Scattered calcification at the mitral and aortic valves. 5. Significant splenomegaly noted. Electronically Signed   By: Garald Balding M.D.   On: 07/13/2015 22:50       ASSESSMENT / PLAN:  PULMONARY A: #Baseline: Gold stage III COPD with borderline exertional desaturations as of 2015  #Current - Acute COPD exacerbation with hypoxemic respiratory failure requiring 2-4 liters oxygen at rest -likely viral exacerbation even though flu PCR is negative - Pulmonary embolism ruled out  - Other differential diagnosis includes acute on chronic diastolic heart failure [trace pleural effusion on CT] and viral mediated hypoxemia  - Slow to improve but this is par for the course  P:   Continue bronchodilation-increased frequency to every 4 hours continue steroids agre with doxy Check echocardiogram Repeat troponin Start Lasix daily IV Aggressive pulmonary toilet Await Resp Multiplex virus panel  Noted: Sometimes vital COPD exacerbation can leave patients permanently hypoxemic because of "hit and run" mechanisms related to viral and airway epithelia  RENAL A:   Mild hypomagnesemia less than 2 g percent P:   Replete mag     Dr. Brand Males, M.D., [facility].C.P Pulmonary and Critical Care Medicine Staff  Physician [facility] Pager: [phone], If no answer or between  15:00h - 7:00h: call [phone]  07/14/2015 9:03 AM

## 2015-07-14 NOTE — Telephone Encounter (Signed)
Marked to charge and mailing no show letter °

## 2015-07-14 NOTE — Evaluation (Signed)
Occupational Therapy Evaluation Patient Details Name: Renee Noble MRN: [id number] DOB: 02-02-1956 Today's Date: 07/14/2015    History of Present Illness admitted with resp distress 07/11/15   Clinical Impression   Pt admitted with RD. Pt currently with functional limitations due to the deficits listed below (see OT Problem List).  Pt will benefit from skilled OT to increase their safety and independence with ADL and functional mobility for ADL to facilitate discharge to venue listed below.      Follow Up Recommendations  No OT follow up    Equipment Recommendations  Tub/shower bench       Precautions / Restrictions Precautions Precaution Comments: on O2       Mobility Bed Mobility               General bed mobility comments: pt in chair  Transfers Overall transfer level: Needs assistance   Transfers: Sit to/from Stand Sit to Stand: Independent                   ADL Overall ADL's : Needs assistance/impaired     Grooming: Wash/dry face;Sitting   Upper Body Bathing: Minimal assitance;Sitting   Lower Body Bathing: Moderate assistance;Sit to/from stand   Upper Body Dressing : Set up;Sitting   Lower Body Dressing: Moderate assistance;Sit to/from stand                 General ADL Comments: pt needs sock aid and tub bench.  Pt aware tub bench is private pay               Pertinent Vitals/Pain Pain Assessment: No/denies pain     Hand Dominance     Extremity/Trunk Assessment Upper Extremity Assessment Upper Extremity Assessment: Generalized weakness           Communication Communication Communication: No difficulties   Cognition Arousal/Alertness: Awake/alert Behavior During Therapy: WFL for tasks assessed/performed Overall Cognitive Status: Within Functional Limits for tasks assessed                     General Comments               Home Living Family/patient expects to be discharged to:: Private  residence Living Arrangements: Other relatives Available Help at Discharge: Family Type of Home: House Home Access: Stairs to enter Technical brewer of Steps: 2   Home Layout: One level     Bathroom Shower/Tub: Tub/shower unit         Home Equipment: Environmental consultant - 2 wheels;Cane - single point          Prior Functioning/Environment Level of Independence: Independent             OT Diagnosis:     OT Problem List: Decreased activity tolerance;Decreased strength   OT Treatment/Interventions: Self-care/ADL training;DME and/or AE instruction;Patient/family education    OT Goals(Current goals can be found in the care plan section) Acute Rehab OT Goals Patient Stated Goal: to go home OT Goal Formulation: With patient Time For Goal Achievement: 07/21/15  OT Frequency:     Barriers to D/C:            Co-evaluation              End of Session Nurse Communication: Mobility status  Activity Tolerance: Patient tolerated treatment well Patient left: in chair   Time: 1530-1617 OT Time Calculation (min): 47 min Charges:  OT General Charges $OT Visit: 1 Procedure OT Evaluation $OT Eval Low Complexity: 1  Procedure OT Treatments $Self Care/Home Management : 8-22 mins G-Codes:    Betsy Pries 2015-07-23, 4:21 PM

## 2015-07-14 NOTE — Telephone Encounter (Signed)
Pt was no show 07/09/15 3:30pm for follow up appt, pt came in 07/11/15, charge or no charge?

## 2015-07-14 NOTE — Progress Notes (Signed)
Date: July 14, 2015 Chart reviewed for concurrent status and case management needs. Will continue to follow patient for changes and needs:  Copd unresolved continued to have wheezing, iv soul-medrol and o2 requirements/transferredfrom sdu to acute care floors. Velva Harman, BSN, [place], [place]   [phone]

## 2015-07-14 NOTE — Progress Notes (Signed)
PT Cancellation Note  Patient Details Name: Renee Noble MRN: [id number] DOB: 1956/05/14   Cancelled Treatment:    Reason Eval/Treat Not Completed: Medical issues which prohibited therapy (attempted earlier at 37;30 to ambulate, had had Lasix and unable to ambulate. will check back 2/28.)   Claretha Cooper 07/14/2015, 5:23 PM Tresa Endo PT [phone]

## 2015-07-14 NOTE — Progress Notes (Signed)
[facility] PROGRESS NOTE  SEVANNAH HUARACHA [id number] DOB: 28-Oct-1955 DOA: 07/11/2015 PCP: [name], PA-C  Assessment/Plan: #1 acute respiratory failure with hypoxia secondary to acute COPD exacerbation with bronchitis Patient with clinical improvement per patient. Patient still with some expiratory wheezing with poor to fair air movement. Patient speaking in full sentences. Patient states significantly hypoxic on minimal exertion.Chest x-ray on admission negative for any acute infiltrate. A respiratory viral panel pending. Continue Pulmicort, doxycycline, IV Solu-Medrol, oxygen, Mucinex. Discontinued Spiriva. Continue Brovana, Xopenex and Atrovent nebs, claritin,PPI, Tamiflu. CT chest negative for pulmonary emboli. Pulmonary/PCCM has been consulted for further evaluation and management. Patient has been placed on daily IV Lasix and 2-D echo pending. PCCM ff.   #2 hypothyroidism  TSH was 0.642.Continue Synthroid.  #3 diabetes mellitus type 2 insulin-dependent Hemoglobin A1c was 6.5 on 01/15/2015. Hemoglobin A1c pending. Continue Trajenta, Lantus, sliding scale insulin.  #4 hyperlipidemia  fasting lipid panel with LDL of 80. Continue Pravachol.  #5 tobacco abuse Tobacco cessation. Continue Chantix.  #6 mild acute kidney injury Resolved. Patient has been placed on IV Lasix per pulmonary. Follow renal function closely.   #7 chronic venous stasis changes/pedal edema Patient takes Lasix as needed. BUN/creatinine ratio was slightly elevated on admission and as such Lasix was held. Patient has been placed on IV Lasix per pulmonary.   #8 chronic hepatitis C   #9  Elevated blood pressure  Patient states does not have a history of hypertension. Continue Norvasc. Patient has been placed on IV Lasix per pulmonary. Hydralazine as needed.  #10 morbid obesity  #11 thrombocytopenia Likely secondary to acute infection and possibly secondary to recent antibiotic. Patient also  noted to have splenomegaly on CT chest. Check LDH. Check a haptoglobin. Patient with no overt bleeding. Discontinued Lovenox and placed on SCDs.  #12 prophylaxis PPI for GI prophylaxis. SCDs for DVT prophylaxis.  Code Status: Full Family Communication: Updated patient. No family present. Disposition Plan: Transfer to telemetry.   Consultants:  PCCM: Dr Chase Caller 07/14/2015  Procedures:  Chest x-ray 07/11/2015  CT chest 07/13/2015  Antibiotics:  Doxycycline 07/11/2015  HPI/Subjective: Patient states some improvement with shortness of breath since admission however still hypoxic on minimal exertion. No chest pain. Sitting up in chair.  Objective: Filed Vitals:   07/14/15 0800 07/14/15 0840  BP: 161/96 161/96  Pulse: 85 90  Temp: 97.4 F (36.3 C)   Resp: 16 18    Intake/Output Summary (Last 24 hours) at 07/14/15 1007 Last data filed at 07/14/15 0829  Gross per 24 hour  Intake    530 ml  Output   2100 ml  Net  -1570 ml   Filed Weights   07/12/15 0030 07/13/15 0400  Weight: 138.9 kg (306 lb 3.5 oz) 139.6 kg (307 lb 12.2 oz)    Exam:   General:  Speaking in full sentences.Sitting up in chair.  Cardiovascular: RRR  Respiratory: Expiratory wheezing. Poor to fair air movement.  Abdomen: Obese, soft, nontender, positive left upper abdominal hernia, positive bowel sounds.  Musculoskeletal: No clubbing or cyanosis. Bilateral chronic venous stasis changes with trace-1+ edema.  Data Reviewed: Basic Metabolic Panel:  Recent Labs Lab 07/11/15 1724 07/11/15 1943 07/12/15 0339 07/13/15 0326 07/13/15 0815 07/14/15 0350  NA 142  --  141 137  --  137  K 5.0  --  4.6 5.5* 5.2* 5.1  CL 92*  --  92* 90*  --  90*  CO2 43*  --  40* 39*  --  38*  GLUCOSE 235*  --  180* 175*  --  185*  BUN 21*  --  20 31*  --  33*  CREATININE 1.02* 1.14* 0.96 0.92  --  0.95  CALCIUM 9.0  --  9.0 9.0  --  9.2  MG  --   --   --  1.7  --  1.7   Liver Function Tests: No results for  input(s): AST, ALT, ALKPHOS, BILITOT, PROT, ALBUMIN in the last 168 hours. No results for input(s): LIPASE, AMYLASE in the last 168 hours. No results for input(s): AMMONIA in the last 168 hours. CBC:  Recent Labs Lab 07/11/15 1859 07/12/15 0339 07/13/15 0326 07/14/15 0350  WBC 6.0 5.4 6.7 7.1  NEUTROABS 5.3  --   --   --   HGB 14.0 12.9 12.6 13.1  HCT 50.0* 45.2 44.0 43.7  MCV 93.5 91.9 91.1 88.3  PLT 65* 62* 70* 65*   Cardiac Enzymes:  Recent Labs Lab 07/11/15 1724  TROPONINI <0.03   BNP (last 3 results)  Recent Labs  07/11/15 1724  BNP 143.1*    ProBNP (last 3 results) No results for input(s): PROBNP in the last 8760 hours.  CBG:  Recent Labs Lab 07/11/15 2203 07/12/15 2210 07/13/15 1151 07/13/15 2222 07/14/15 0757  GLUCAP 219* 207* 201* 162* 126*    Recent Results (from the past 240 hour(s))  MRSA PCR Screening     Status: None   Collection Time: 07/11/15 11:30 PM  Result Value Ref Range Status   MRSA by PCR NEGATIVE NEGATIVE Final    Comment:        The GeneXpert MRSA Assay (FDA approved for NASAL specimens only), is one component of a comprehensive MRSA colonization surveillance program. It is not intended to diagnose MRSA infection nor to guide or monitor treatment for MRSA infections.      Studies: Ct Angio Chest Pe W/cm &/or Wo Cm  07/13/2015  CLINICAL DATA:  Chronic worsening shortness of breath, with decreased O2 saturation. Initial encounter. EXAM: CT ANGIOGRAPHY CHEST WITH CONTRAST TECHNIQUE: Multidetector CT imaging of the chest was performed using the standard protocol during bolus administration of intravenous contrast. Multiplanar CT image reconstructions and MIPs were obtained to evaluate the vascular anatomy. CONTRAST:  179mL OMNIPAQUE IOHEXOL 350 MG/ML SOLN COMPARISON:  Chest radiograph performed 07/11/2015, and CTA of the chest performed 06/20/2013 FINDINGS: There is no evidence of pulmonary embolus. A trace left pleural effusion  is noted, with mild left basilar opacity likely reflecting atelectasis. Mild scarring is noted at the posterior aspect of the right upper lobe. Minimal nonspecific peribronchovascular opacity within the right upper lobe may reflect chronic changes of infection. There is no evidence of pneumothorax. No masses are identified; no abnormal focal contrast enhancement is seen. Scattered calcification is noted at the mitral and aortic valves. Diffuse coronary artery calcifications are seen. Scattered calcification is noted along the proximal great vessels and aortic arch. No pericardial effusion is identified. No mediastinal lymphadenopathy is seen. No axillary lymphadenopathy is seen. The visualized portions of the thyroid gland are unremarkable in appearance. The spleen is diffusely enlarged, measuring up to 20.5 cm in length. The liver also appears mildly prominent. The patient is status post median sternotomy, with evidence of prior CABG. The visualized portions of the pancreas, adrenal glands and right kidney are grossly unremarkable. No acute osseous abnormalities are seen. Review of the MIP images confirms the above findings. IMPRESSION: 1. No evidence of pulmonary embolus. 2. Minimal nonspecific peribronchial vascular opacity  within the right upper lobe may reflect chronic changes of infection, new from 2015. 3. Trace left pleural effusion, with mild left basilar airspace opacity likely reflecting atelectasis. Mild scarring at the posterior aspect of the right upper lobe is stable in appearance. 4. Diffuse coronary artery calcifications seen. Scattered calcification at the mitral and aortic valves. 5. Significant splenomegaly noted. Electronically Signed   By: Garald Balding M.D.   On: 07/13/2015 22:50    Scheduled Meds: . amLODipine  10 mg Oral Daily  . antiseptic oral rinse  7 mL Mouth Rinse q12n4p  . budesonide (PULMICORT) nebulizer solution  0.25 mg Nebulization BID  . chlorhexidine  15 mL Mouth Rinse  BID  . doxycycline  100 mg Oral Q12H  . furosemide  40 mg Intravenous Daily  . guaiFENesin  1,200 mg Oral BID  . insulin aspart  0-20 Units Subcutaneous TID WC  . insulin aspart  0-5 Units Subcutaneous QHS  . insulin glargine  30 Units Subcutaneous Daily  . ipratropium  0.5 mg Nebulization Q4H  . levalbuterol  0.63 mg Nebulization Q4H  . levothyroxine  75 mcg Oral QHS  . linagliptin  5 mg Oral Daily  . loratadine  10 mg Oral Daily  . magnesium sulfate 1 - 4 g bolus IVPB  2 g Intravenous Once  . methylPREDNISolone (SOLU-MEDROL) injection  80 mg Intravenous Q12H  . nortriptyline  100 mg Oral QHS  . oseltamivir  75 mg Oral BID  . pantoprazole  40 mg Oral Q0600  . pravastatin  20 mg Oral Daily  . sodium chloride flush  3 mL Intravenous Q12H  . varenicline  0.5 mg Oral BID   Followed by  . [START ON 07/17/2015] varenicline  1 mg Oral BID   Continuous Infusions:   Principal Problem:   Respiratory failure, acute (HCC) Active Problems:   COPD exacerbation (HCC)   COPD GOLDIII with min reversibility    Chronic hepatitis C without hepatic coma (HCC)   Diabetes mellitus, type II, insulin dependent (HCC)   Thyroid activity decreased   Nicotine abuse   AKI (acute kidney injury) ([place])   COPD (chronic obstructive pulmonary disease) with acute bronchitis ([place])   Thrombocytopenia ([place])   COPD with acute exacerbation ([place])   Hypoxia    Time spent: [facility] Pager [phone]. If 7PM-7AM, please contact night-coverage at [email or web address], password [facility] 07/14/2015, 10:07 AM  LOS: 3 days

## 2015-07-15 ENCOUNTER — Ambulatory Visit (HOSPITAL_BASED_OUTPATIENT_CLINIC_OR_DEPARTMENT_OTHER): Payer: Medicare Other

## 2015-07-15 LAB — MAGNESIUM: Magnesium: 2.2 mg/dL (ref 1.7–2.4)

## 2015-07-15 LAB — BLOOD GAS, ARTERIAL
Acid-Base Excess: 13.3 mmol/L — ABNORMAL HIGH (ref 0.0–2.0)
BICARBONATE: 41.2 meq/L — AB (ref 20.0–24.0)
DRAWN BY: 232811
O2 CONTENT: 4 L/min
O2 Saturation: 97 %
PCO2 ART: 68 mmHg — AB (ref 35.0–45.0)
PH ART: 7.397 (ref 7.350–7.450)
PO2 ART: 96.2 mmHg (ref 80.0–100.0)
Patient temperature: 97.7
TCO2: 36.8 mmol/L (ref 0–100)

## 2015-07-15 LAB — BASIC METABOLIC PANEL
Anion gap: 8 (ref 5–15)
BUN: 34 mg/dL — AB (ref 6–20)
CO2: 40 mmol/L — ABNORMAL HIGH (ref 22–32)
CREATININE: 0.97 mg/dL (ref 0.44–1.00)
Calcium: 9.1 mg/dL (ref 8.9–10.3)
Chloride: 89 mmol/L — ABNORMAL LOW (ref 101–111)
GFR calc Af Amer: >60 mL/min (ref >60)
GLUCOSE: 166 mg/dL — AB (ref 65–99)
POTASSIUM: 4.4 mmol/L (ref 3.5–5.1)
SODIUM: 137 mmol/L (ref 135–145)

## 2015-07-15 LAB — CBC WITH DIFFERENTIAL/PLATELET
Basophils Absolute: 0 10*3/uL (ref 0.0–0.1)
Basophils Relative: 0 %
EOS ABS: 0 10*3/uL (ref 0.0–0.7)
EOS PCT: 0 %
HCT: 42.1 % (ref 36.0–46.0)
Hemoglobin: 13.1 g/dL (ref 12.0–15.0)
LYMPHS ABS: 0.3 10*3/uL — AB (ref 0.7–4.0)
LYMPHS PCT: 6 %
MCH: 27 pg (ref 26.0–34.0)
MCHC: 31.1 g/dL (ref 30.0–36.0)
MCV: 86.8 fL (ref 78.0–100.0)
MONO ABS: 0.3 10*3/uL (ref 0.1–1.0)
MONOS PCT: 4 %
Neutro Abs: 5.2 10*3/uL (ref 1.7–7.7)
Neutrophils Relative %: 90 %
PLATELETS: 65 10*3/uL — AB (ref 150–400)
RBC: 4.85 MIL/uL (ref 3.87–5.11)
RDW: 16.6 % — AB (ref 11.5–15.5)
WBC: 5.7 10*3/uL (ref 4.0–10.5)

## 2015-07-15 LAB — RESPIRATORY VIRUS PANEL
Adenovirus: NEGATIVE
Influenza A: NEGATIVE
Influenza B: NEGATIVE
METAPNEUMOVIRUS: NEGATIVE
PARAINFLUENZA 2 A: NEGATIVE
PARAINFLUENZA 3 A: NEGATIVE
Parainfluenza 1: NEGATIVE
RESPIRATORY SYNCYTIAL VIRUS A: NEGATIVE
Respiratory Syncytial Virus B: NEGATIVE
Rhinovirus: NEGATIVE

## 2015-07-15 LAB — TROPONIN I

## 2015-07-15 LAB — PHOSPHORUS: PHOSPHORUS: 4.9 mg/dL — AB (ref 2.5–4.6)

## 2015-07-15 LAB — GLUCOSE, CAPILLARY
GLUCOSE-CAPILLARY: 171 mg/dL — AB (ref 65–99)
GLUCOSE-CAPILLARY: 97 mg/dL (ref 65–99)
Glucose-Capillary: 135 mg/dL — ABNORMAL HIGH (ref 65–99)
Glucose-Capillary: 178 mg/dL — ABNORMAL HIGH (ref 65–99)

## 2015-07-15 MED ORDER — FUROSEMIDE 40 MG PO TABS
40.0000 mg | ORAL_TABLET | Freq: Every day | ORAL | Status: DC
  Administered 2015-07-15 – 2015-07-16 (×2): 40 mg via ORAL
  Filled 2015-07-15 (×2): qty 1

## 2015-07-15 NOTE — Progress Notes (Signed)
PULMONARY / CRITICAL CARE MEDICINE   Name: Renee Noble MRN: [id number] DOB: [birth year]    ADMISSION DATE:  07/11/2015 CONSULTATION DATE:  07/14/15  REFERRING MD:  Dr Irine Seal  CHIEF COMPLAINT:  Slow to resolve AECOPD  BRIEF   60 year old female with severe Gold stage III COPD based on 2015 pulmonary function test that showed FEV1 1.22 L/48% and DLCO 73%. According to the patient back in 2015 based on walking desaturation test in the office she was taken off oxygen. Review of the record shows that she was only walked 2 out of 3 customary laps 185 feet each and stopped when her pulse ox dropped to 89% because of chest tightness. Nevertheless she was taken off oxygen and according to history. Overall she rates her quality of life as 8 out of 10 [10 being excellent]. She was then admitted 07/11/2015 after having a 2 week history of worsening cough and chest tightness. In the pulmonary office on 07/11/2015 she was found desaturating to 84% requiring oxygen. Here in the hospital she's been treated by the hospitalist service with conventional COPD exacerbation management doxycycline and steroids [review the medication list shows that her home Lasix has been held]. Since admission she has improved the subject's score of 7 out of 10 at rest but she finds that she easily desaturates and gets very short of breath when she transfers out of bed and goes to the toilet. She says this is a new finding for her because at baseline she is able to walk a block and a half at home before she gets dyspneic. A CT Angio chest was done and it ruled out pulmonary embolism. Pulmonary critical care medicine has been consulted 07/14/2015 due to acute on chronic hypoxemic respiratory failure and slow to improve COPD exacerbation. s e is on empiric flu rx though nasal PCR flu is negative due to community outbreak    SUBJECTIVE/OVERNIGHT/INTERVAL HX 2/28 - beginningt to feel better. No longer dyspneic goin gto toilet.  Walaked 100 feeet with PT on 3L [place] and desats to 87% - so better but still worse than baseline.  Improvement noticed after starting lasix 07/14/15  VIAL SIGNS: BP 131/68 mmHg  Pulse 95  Temp(Src) 97.7 F (36.5 C) (Oral)  Resp 22  Ht 5\' 3"  (1.6 m)  Wt 139.6 kg (307 lb 12.2 oz)  BMI 54.53 kg/m2  SpO2 96%  HEMODYNAMICS:    VENTILATOR SETTINGS:    INTAKE / OUTPUT: I/O last 3 completed shifts: In: 87 [P.O.:480; IV Piggyback:100] Out: 3100 [Urine:3100]  PHYSICAL EXAMINATION: General:  Obese lady seated comfortably in the bed Neuro:  Alert and oriented 3. Speech is normal. Glasgow Coma Scale 15. CAM-ICU negative for delirium. Moves all 4. Walkng normally Psych: cheerful.  HEENT:  Obese neck. Mallampati class III-IV oxygen on. Cardiovascular:  Regular rate and rhythm no murmurs Lungs:  Overall diminished air entry due to obesity. No wheeze no crackles Abdomen:  Obese soft nontender Musculoskeletal:  Chronic bilateral 1-2 plus edema present Skin:  Intact in the exposed areas.  LABS:  PULMONARY  Recent Labs Lab 07/15/15 0425  PHART 7.397  PCO2ART 68.0*  PO2ART 96.2  HCO3 41.2*  TCO2 36.8  O2SAT 97.0    CBC  Recent Labs Lab 07/13/15 0326 07/14/15 0350 07/15/15 0448  HGB 12.6 13.1 13.1  HCT 44.0 43.7 42.1  WBC 6.7 7.1 5.7  PLT 70* 65* 65*    COAGULATION No results for input(s): INR in the last 168 hours.  CARDIAC    Recent Labs Lab 07/11/15 1724 07/15/15 0448  TROPONINI <0.03 <0.03   No results for input(s): PROBNP in the last 168 hours.   CHEMISTRY  Recent Labs Lab 07/11/15 1724 07/11/15 1943 07/12/15 0339 07/13/15 0326  07/14/15 0350 07/15/15 0448  NA 142  --  141 137  --  137 137  K 5.0  --  4.6 5.5*  < > 5.1 4.4  CL 92*  --  92* 90*  --  90* 89*  CO2 43*  --  40* 39*  --  38* 40*  GLUCOSE 235*  --  180* 175*  --  185* 166*  BUN 21*  --  20 31*  --  33* 34*  CREATININE 1.02* 1.14* 0.96 0.92  --  0.95 0.97  CALCIUM 9.0  --  9.0 9.0   --  9.2 9.1  MG  --   --   --  1.7  --  1.7 2.2  PHOS  --   --   --   --   --   --  4.9*  < > = values in this interval not displayed. Estimated Creatinine Clearance: 83.9 mL/min (by C-G formula based on Cr of 0.97).   LIVER No results for input(s): AST, ALT, ALKPHOS, BILITOT, PROT, ALBUMIN, INR in the last 168 hours.   INFECTIOUS No results for input(s): LATICACIDVEN, PROCALCITON in the last 168 hours.   ENDOCRINE CBG (last 3)   Recent Labs  07/14/15 1609 07/14/15 1651 07/15/15 0747  GLUCAP 178* 177* 135*         IMAGING x48h  - image(s) personally visualized  -   highlighted in bold Ct Angio Chest Pe W/cm &/or Wo Cm  07/13/2015  CLINICAL DATA:  Chronic worsening shortness of breath, with decreased O2 saturation. Initial encounter. EXAM: CT ANGIOGRAPHY CHEST WITH CONTRAST TECHNIQUE: Multidetector CT imaging of the chest was performed using the standard protocol during bolus administration of intravenous contrast. Multiplanar CT image reconstructions and MIPs were obtained to evaluate the vascular anatomy. CONTRAST:  148mL OMNIPAQUE IOHEXOL 350 MG/ML SOLN COMPARISON:  Chest radiograph performed 07/11/2015, and CTA of the chest performed 06/20/2013 FINDINGS: There is no evidence of pulmonary embolus. A trace left pleural effusion is noted, with mild left basilar opacity likely reflecting atelectasis. Mild scarring is noted at the posterior aspect of the right upper lobe. Minimal nonspecific peribronchovascular opacity within the right upper lobe may reflect chronic changes of infection. There is no evidence of pneumothorax. No masses are identified; no abnormal focal contrast enhancement is seen. Scattered calcification is noted at the mitral and aortic valves. Diffuse coronary artery calcifications are seen. Scattered calcification is noted along the proximal great vessels and aortic arch. No pericardial effusion is identified. No mediastinal lymphadenopathy is seen. No axillary  lymphadenopathy is seen. The visualized portions of the thyroid gland are unremarkable in appearance. The spleen is diffusely enlarged, measuring up to 20.5 cm in length. The liver also appears mildly prominent. The patient is status post median sternotomy, with evidence of prior CABG. The visualized portions of the pancreas, adrenal glands and right kidney are grossly unremarkable. No acute osseous abnormalities are seen. Review of the MIP images confirms the above findings. IMPRESSION: 1. No evidence of pulmonary embolus. 2. Minimal nonspecific peribronchial vascular opacity within the right upper lobe may reflect chronic changes of infection, new from 2015. 3. Trace left pleural effusion, with mild left basilar airspace opacity likely reflecting atelectasis. Mild scarring at the posterior  aspect of the right upper lobe is stable in appearance. 4. Diffuse coronary artery calcifications seen. Scattered calcification at the mitral and aortic valves. 5. Significant splenomegaly noted. Electronically Signed   By: Garald Balding M.D.   On: 07/13/2015 22:50       ASSESSMENT / PLAN:  PULMONARY A: #Baseline: Gold stage III COPD with borderline exertional desaturations as of 2015  #Current - Acute COPD exacerbation with hypoxemic respiratory failure requiring 2-4 liters oxygen at rest -likely viral exacerbation even though flu PCR is negative - Pulmonary embolism ruled out  - Other differential diagnosis includes acute on chronic diastolic heart failure [trace pleural effusion on CT] and viral mediated hypoxemia  - Slow to improve but this is par for the course  - some subjective and objective improvement with dyspnea/ex hypoxemia 2/28 after  Starting lasix 2/27 - so suspect component of acute diast chf in setting of aecopd, obesity and steroids  P:   Continue bronchodilation- frequency to every 4 hours continue steroids agre with doxy - end at f 5days Await  Echocardiogram result 2/27 Repeat  troponin  Lasix daily -> chagne IV to po 40mg   Aggressive pulmonary toilet Await Resp Multiplex virus panel Check Spirometry - ordered 07/15/2015   Noted: Sometimes vital COPD exacerbation can leave patients permanently hypoxemic because of "hit and run" mechanisms related to viral and airway epithelia  RENAL A:   Mild hypomagnesemia less than 2 g percent 2/27 and repleted P:   Monitor     Dr. Brand Males, M.D., [facility]C.P Pulmonary and Critical Care Medicine Staff Physician [place] [facility] Pager: [phone], If no answer or between  15:00h - 7:00h: call [phone]  07/15/2015 8:57 AM

## 2015-07-15 NOTE — Progress Notes (Signed)
[facility] PROGRESS NOTE  Renee Noble [id number] DOB: [birth year] DOA: 07/11/2015 PCP: [name], PA-C  Brief interval history  Renee Noble is a 60 y.o. female with COPD not on home O2, insulin requiring diabetes mellitus, renal cancer status post left nephrectomy, hepatitis C, chronic pedal edema, hypertension, morbid obesity who presented to the hospital for cough shortness of breath and wheezing. The patient stated that she had been sick with these symptoms for almost 2 weeks now. She was seen by pulmonary on 2/17 and started on prednisone and Augmentin. She had been using nebulizers at home. She is usually not on oxygen at baseline but has been requiring 2-3 L for the past couple of weeks. Despite these measures, her symptoms have not improved. She returned to the pulmonary office on the day of admission and was evaluated and told to go to the ER to be admitted. In the ER she is noted to be wheezing. Oxygen saturation is about 95-97% on 3 L. He states that she becomes short of breath even with mild exertion. She has a cough and although she feels congested, cough is nonproductive. No complaint of fevers.   Patient was admitted to the step down unit with acute respiratory failure with hypoxia felt to be secondary to an acute COPD exacerbation with bronchitis. Patient was placed on Pulmicort, Brovana, IV steroids, doxycycline, oxygen, scheduled nebulizers, PPI, Tamiflu. Influenza PCR was obtained and is currently pending. Patient improved somewhat however had significant hypoxia on minimal exertion as such CT chest was done which was negative for PE. 2-D echo was done that showed a EF of 55-60%. Pulmicort consultation was obtained and patient was given a dose of IV Lasix with good urine output and clinical improvement. Patient was maintained on current regimen and has been followed by pulmonary.    Assessment/Plan: #1 acute respiratory failure with hypoxia secondary to acute  COPD exacerbation with bronchitis Patient with clinical improvement per patient. Patient still with some expiratory wheezing with fair air movement. Patient speaking in full sentences. Patient states significantly hypoxia on minimal exertion has improved significantly with diuresis.Chest x-ray on admission negative for any acute infiltrate. A respiratory viral panel pending. Continue Pulmicort, doxycycline, IV Solu-Medrol, oxygen, Mucinex. Discontinued Spiriva. Continue Brovana, Xopenex and Atrovent nebs, claritin,PPI, Tamiflu. CT chest negative for pulmonary emboli. 2-D echo with 55-60%. Normal right ventricular size and systolic function. Normal left ventricular size. Patient is -4.18 L over the past 24 hours and -2.86 L during this hospitalization. IV Lasix having transitioned to oral Lasix per pulmonary. Pulmonary following and appreciate the upper recommendations.   #2 hypothyroidism  TSH was 0.642.Continue Synthroid.  #3 diabetes mellitus type 2 insulin-dependent Hemoglobin A1c was 6.5 on 01/15/2015. Hemoglobin A1c is 6.2. Continue Trajenta, Lantus, sliding scale insulin.  #4 hyperlipidemia  fasting lipid panel with LDL of 80. Continue Pravachol.  #5 tobacco abuse Tobacco cessation. Continue Chantix.  #6 mild acute kidney injury Resolved. Patient has been placed on IV Lasix per pulmonary. Follow renal function closely.   #7 chronic venous stasis changes/pedal edema Patient takes Lasix as needed. BUN/creatinine ratio was slightly elevated on admission and as such Lasix was held. Patient has been placed on Lasix per pulmonary.   #8 chronic hepatitis C   #9  Elevated blood pressure  Patient states does not have a history of hypertension. Continue Norvasc. Patient has been placed on  Lasix per pulmonary. Hydralazine as needed.  #10 morbid obesity  #11 thrombocytopenia Likely secondary to acute  infection and possibly secondary to recent antibiotic. Patient also noted to have  splenomegaly on CT chest. Check LDH. Check a haptoglobin. Patient with no overt bleeding. Discontinued Lovenox and placed on SCDs.  #12 prophylaxis PPI for GI prophylaxis. SCDs for DVT prophylaxis.  Code Status: Full Family Communication: Updated patient. No family present. Disposition Plan: Remain inpatient. Home when breathing has improved significantly and on oral medications and per pulmonary.   Consultants:  PCCM: Dr Chase Caller 07/14/2015  Procedures:  Chest x-ray 07/11/2015  CT chest 07/13/2015  2-D echo 07/14/2015  Antibiotics:  Doxycycline 07/11/2015  HPI/Subjective: Patient states feels much better. Shortness of breath improved. No chest pain.  Objective: Filed Vitals:   07/14/15 2218 07/15/15 0420  BP: 142/77 131/68  Pulse: 102 95  Temp: 98.3 F (36.8 C) 97.7 F (36.5 C)  Resp: 22 22    Intake/Output Summary (Last 24 hours) at 07/15/15 1357 Last data filed at 07/15/15 0857  Gross per 24 hour  Intake    720 ml  Output    700 ml  Net     20 ml   Filed Weights   07/12/15 0030 07/13/15 0400  Weight: 138.9 kg (306 lb 3.5 oz) 139.6 kg (307 lb 12.2 oz)    Exam:   General:  Speaking in full sentences.Sitting up in chair.  Cardiovascular: RRR  Respiratory: Minimal Expiratory wheezing. fair air movement.  Abdomen: Obese, soft, nontender, positive left upper abdominal hernia, positive bowel sounds.  Musculoskeletal: No clubbing or cyanosis. Bilateral chronic venous stasis changes with trace-1+ edema.  Data Reviewed: Basic Metabolic Panel:  Recent Labs Lab 07/11/15 1724 07/11/15 1943 07/12/15 [id number] 07/13/15 0326 07/13/15 0815 07/14/15 0350 07/15/15 0448  NA 142  --  141 137  --  137 137  K 5.0  --  4.6 5.5* 5.2* 5.1 4.4  CL 92*  --  92* 90*  --  90* 89*  CO2 43*  --  40* 39*  --  38* 40*  GLUCOSE 235*  --  180* 175*  --  185* 166*  BUN 21*  --  20 31*  --  33* 34*  CREATININE 1.02* 1.14* 0.96 0.92  --  0.95 0.97  CALCIUM 9.0  --  9.0 9.0   --  9.2 9.1  MG  --   --   --  1.7  --  1.7 2.2  PHOS  --   --   --   --   --   --  4.9*   Liver Function Tests: No results for input(s): AST, ALT, ALKPHOS, BILITOT, PROT, ALBUMIN in the last 168 hours. No results for input(s): LIPASE, AMYLASE in the last 168 hours. No results for input(s): AMMONIA in the last 168 hours. CBC:  Recent Labs Lab 07/11/15 1859 07/12/15 0339 07/13/15 0326 07/14/15 0350 07/15/15 0448  WBC 6.0 5.4 6.7 7.1 5.7  NEUTROABS 5.3  --   --   --  5.2  HGB 14.0 12.9 12.6 13.1 13.1  HCT 50.0* 45.2 44.0 43.7 42.1  MCV 93.5 91.9 91.1 88.3 86.8  PLT 65* 62* 70* 65* 65*   Cardiac Enzymes:  Recent Labs Lab 07/11/15 1724 07/15/15 0448  TROPONINI <0.03 <0.03   BNP (last 3 results)  Recent Labs  07/11/15 1724  BNP 143.1*    ProBNP (last 3 results) No results for input(s): PROBNP in the last 8760 hours.  CBG:  Recent Labs Lab 07/14/15 1152 07/14/15 1609 07/14/15 1651 07/15/15 0747 07/15/15 1207  GLUCAP 165*  178* 177* 135* 178*    Recent Results (from the past 240 hour(s))  MRSA PCR Screening     Status: None   Collection Time: 07/11/15 11:30 PM  Result Value Ref Range Status   MRSA by PCR NEGATIVE NEGATIVE Final    Comment:        The GeneXpert MRSA Assay (FDA approved for NASAL specimens only), is one component of a comprehensive MRSA colonization surveillance program. It is not intended to diagnose MRSA infection nor to guide or monitor treatment for MRSA infections.      Studies: Ct Angio Chest Pe W/cm &/or Wo Cm  07/13/2015  CLINICAL DATA:  Chronic worsening shortness of breath, with decreased O2 saturation. Initial encounter. EXAM: CT ANGIOGRAPHY CHEST WITH CONTRAST TECHNIQUE: Multidetector CT imaging of the chest was performed using the standard protocol during bolus administration of intravenous contrast. Multiplanar CT image reconstructions and MIPs were obtained to evaluate the vascular anatomy. CONTRAST:  185mL OMNIPAQUE  IOHEXOL 350 MG/ML SOLN COMPARISON:  Chest radiograph performed 07/11/2015, and CTA of the chest performed 06/20/2013 FINDINGS: There is no evidence of pulmonary embolus. A trace left pleural effusion is noted, with mild left basilar opacity likely reflecting atelectasis. Mild scarring is noted at the posterior aspect of the right upper lobe. Minimal nonspecific peribronchovascular opacity within the right upper lobe may reflect chronic changes of infection. There is no evidence of pneumothorax. No masses are identified; no abnormal focal contrast enhancement is seen. Scattered calcification is noted at the mitral and aortic valves. Diffuse coronary artery calcifications are seen. Scattered calcification is noted along the proximal great vessels and aortic arch. No pericardial effusion is identified. No mediastinal lymphadenopathy is seen. No axillary lymphadenopathy is seen. The visualized portions of the thyroid gland are unremarkable in appearance. The spleen is diffusely enlarged, measuring up to 20.5 cm in length. The liver also appears mildly prominent. The patient is status post median sternotomy, with evidence of prior CABG. The visualized portions of the pancreas, adrenal glands and right kidney are grossly unremarkable. No acute osseous abnormalities are seen. Review of the MIP images confirms the above findings. IMPRESSION: 1. No evidence of pulmonary embolus. 2. Minimal nonspecific peribronchial vascular opacity within the right upper lobe may reflect chronic changes of infection, new from 2015. 3. Trace left pleural effusion, with mild left basilar airspace opacity likely reflecting atelectasis. Mild scarring at the posterior aspect of the right upper lobe is stable in appearance. 4. Diffuse coronary artery calcifications seen. Scattered calcification at the mitral and aortic valves. 5. Significant splenomegaly noted. Electronically Signed   By: Garald Balding M.D.   On: 07/13/2015 22:50    Scheduled  Meds: . amLODipine  10 mg Oral Daily  . antiseptic oral rinse  7 mL Mouth Rinse q12n4p  . budesonide (PULMICORT) nebulizer solution  0.25 mg Nebulization BID  . chlorhexidine  15 mL Mouth Rinse BID  . doxycycline  100 mg Oral Q12H  . furosemide  40 mg Oral Daily  . guaiFENesin  1,200 mg Oral BID  . insulin aspart  0-20 Units Subcutaneous TID WC  . insulin aspart  0-5 Units Subcutaneous QHS  . insulin glargine  30 Units Subcutaneous Daily  . ipratropium  0.5 mg Nebulization Q4H  . levalbuterol  0.63 mg Nebulization Q4H  . levothyroxine  75 mcg Oral QHS  . linagliptin  5 mg Oral Daily  . loratadine  10 mg Oral Daily  . nortriptyline  100 mg Oral QHS  . oseltamivir  75 mg Oral BID  . pantoprazole  40 mg Oral Q0600  . pravastatin  20 mg Oral Daily  . sodium chloride flush  3 mL Intravenous Q12H  . varenicline  0.5 mg Oral BID   Followed by  . [START ON 07/17/2015] varenicline  1 mg Oral BID   Continuous Infusions:   Principal Problem:   Respiratory failure, acute (HCC) Active Problems:   COPD exacerbation (HCC)   COPD GOLDIII with min reversibility    Chronic hepatitis C without hepatic coma (HCC)   Diabetes mellitus, type II, insulin dependent (HCC)   Thyroid activity decreased   Nicotine abuse   AKI (acute kidney injury) ([place])   COPD (chronic obstructive pulmonary disease) with acute bronchitis ([place])   Thrombocytopenia ([place])   COPD with acute exacerbation ([place])   Hypoxia    Time spent: [place] [facility] Pager [phone]. If 7PM-7AM, please contact night-coverage at [email or web address], password [facility] 07/15/2015, 1:57 PM  LOS: 4 days

## 2015-07-15 NOTE — Care Management Important Message (Signed)
Important Message  Patient Details IM Letter given to Rhonda/Case Manager to present to Patient Name: Renee Noble MRN: [id number] Date of Birth: 1955/08/23   Medicare Important Message Given:  Yes    Camillo Flaming 07/15/2015, 1:19 PMImportant Message  Patient Details  Name: Renee Noble MRN: [id number] Date of Birth: 02-Nov-1955   Medicare Important Message Given:  Yes    Camillo Flaming 07/15/2015, 1:19 PM

## 2015-07-15 NOTE — Progress Notes (Signed)
Physical Therapy Treatment Patient Details Name: ULAH NUMAN MRN: [id number] DOB: [birth year] Today's Date: 07/15/2015    History of Present Illness admitted with resp distress 07/11/15    PT Comments    The patient is progressing well, ambulated on 3 liters O2 with sats down to 87%, HR 115.  Follow Up Recommendations  No PT follow up     Equipment Recommendations  None recommended by PT    Recommendations for Other Services       Precautions / Restrictions Precautions Precaution Comments: on O2     Mobility  Bed Mobility                  Transfers       Sit to Stand: Independent            Ambulation/Gait Ambulation/Gait assistance: Supervision Ambulation Distance (Feet): 200 Feet (x 2) Assistive device: None Gait Pattern/deviations: Step-through pattern;Wide base of support     General Gait Details: patient is mobilizing well without asisstance. on 3 L. sats 87% first walk then 88%, HR 115.   Stairs            Wheelchair Mobility    Modified Rankin (Stroke Patients Only)       Balance Overall balance assessment: Needs assistance         Standing balance support: No upper extremity supported;During functional activity Standing balance-Leahy Scale: Fair                      Cognition Arousal/Alertness: Awake/alert                          Exercises      General Comments        Pertinent Vitals/Pain Pain Assessment: No/denies pain    Home Living                      Prior Function            PT Goals (current goals can now be found in the care plan section) Progress towards PT goals: Progressing toward goals    Frequency  Min 3X/week    PT Plan Current plan remains appropriate    Co-evaluation             End of Session   Activity Tolerance: Patient tolerated treatment well Patient left: in chair;with call bell/phone within reach     Time: 0840-0859 PT Time  Calculation (min) (ACUTE ONLY): 19 min  Charges:  $Gait Training: 8-22 mins                    G Codes:      Marcelino Freestone PT [id number]  07/15/2015, 4:34 PM

## 2015-07-16 DIAGNOSIS — N179 Acute kidney failure, unspecified: Secondary | ICD-10-CM

## 2015-07-16 DIAGNOSIS — J440 Chronic obstructive pulmonary disease with acute lower respiratory infection: Secondary | ICD-10-CM

## 2015-07-16 DIAGNOSIS — D696 Thrombocytopenia, unspecified: Secondary | ICD-10-CM

## 2015-07-16 DIAGNOSIS — J9600 Acute respiratory failure, unspecified whether with hypoxia or hypercapnia: Secondary | ICD-10-CM

## 2015-07-16 LAB — BASIC METABOLIC PANEL
Anion gap: 7 (ref 5–15)
BUN: 37 mg/dL — AB (ref 6–20)
CHLORIDE: 88 mmol/L — AB (ref 101–111)
CO2: 44 mmol/L — ABNORMAL HIGH (ref 22–32)
CREATININE: 1.29 mg/dL — AB (ref 0.44–1.00)
Calcium: 9.1 mg/dL (ref 8.9–10.3)
GFR calc Af Amer: 51 mL/min — ABNORMAL LOW (ref >60)
GFR calc non Af Amer: 44 mL/min — ABNORMAL LOW (ref >60)
Glucose, Bld: 77 mg/dL (ref 65–99)
POTASSIUM: 4.3 mmol/L (ref 3.5–5.1)
Sodium: 139 mmol/L (ref 135–145)

## 2015-07-16 LAB — CBC WITH DIFFERENTIAL/PLATELET
Basophils Absolute: 0 10*3/uL (ref 0.0–0.1)
Basophils Relative: 0 %
EOS ABS: 0 10*3/uL (ref 0.0–0.7)
EOS PCT: 0 %
HCT: 44 % (ref 36.0–46.0)
HEMOGLOBIN: 13.2 g/dL (ref 12.0–15.0)
LYMPHS ABS: 1 10*3/uL (ref 0.7–4.0)
LYMPHS PCT: 17 %
MCH: 26.2 pg (ref 26.0–34.0)
MCHC: 30 g/dL (ref 30.0–36.0)
MCV: 87.5 fL (ref 78.0–100.0)
MONOS PCT: 9 %
Monocytes Absolute: 0.5 10*3/uL (ref 0.1–1.0)
NEUTROS PCT: 73 %
Neutro Abs: 4.1 10*3/uL (ref 1.7–7.7)
Platelets: 56 10*3/uL — ABNORMAL LOW (ref 150–400)
RBC: 5.03 MIL/uL (ref 3.87–5.11)
RDW: 16.8 % — ABNORMAL HIGH (ref 11.5–15.5)
WBC: 5.6 10*3/uL (ref 4.0–10.5)

## 2015-07-16 LAB — PHOSPHORUS: Phosphorus: 5.2 mg/dL — ABNORMAL HIGH (ref 2.5–4.6)

## 2015-07-16 LAB — GLUCOSE, CAPILLARY
GLUCOSE-CAPILLARY: 343 mg/dL — AB (ref 65–99)
GLUCOSE-CAPILLARY: 109 mg/dL — AB (ref 65–99)
GLUCOSE-CAPILLARY: 151 mg/dL — AB (ref 65–99)
GLUCOSE-CAPILLARY: 167 mg/dL — AB (ref 65–99)
GLUCOSE-CAPILLARY: 221 mg/dL — AB (ref 65–99)
GLUCOSE-CAPILLARY: 86 mg/dL (ref 65–99)
GLUCOSE-CAPILLARY: 116 mg/dL — AB (ref 65–99)
GLUCOSE-CAPILLARY: 110 mg/dL — AB (ref 65–99)
Glucose-Capillary: 177 mg/dL — ABNORMAL HIGH (ref 65–99)
Glucose-Capillary: 72 mg/dL (ref 65–99)

## 2015-07-16 LAB — MAGNESIUM: Magnesium: 1.8 mg/dL (ref 1.7–2.4)

## 2015-07-16 MED ORDER — MAGNESIUM SULFATE 2 GM/50ML IV SOLN
2.0000 g | Freq: Once | INTRAVENOUS | Status: AC
  Administered 2015-07-16: 2 g via INTRAVENOUS
  Filled 2015-07-16: qty 50

## 2015-07-16 NOTE — Progress Notes (Addendum)
[facility] PROGRESS NOTE  Renee Noble [id number] DOB: 1955/09/21 DOA: 07/11/2015 PCP: [name], PA-C  Brief interval history  Renee Noble is a 60 y.o. female with COPD not on home O2, insulin requiring diabetes mellitus, renal cancer status post left nephrectomy, hepatitis C, chronic pedal edema, hypertension, morbid obesity who presented to the hospital for cough shortness of breath and wheezing. The patient stated that she had been sick with these symptoms for almost 2 weeks now. She was seen by pulmonary on 2/17 and started on prednisone and Augmentin. She had been using nebulizers at home. She is usually not on oxygen at baseline but has been requiring 2-3 L for the past couple of weeks. Despite these measures, her symptoms have not improved. She returned to the pulmonary office on the day of admission and was evaluated and told to go to the ER to be admitted. In the ER she is noted to be wheezing. Oxygen saturation is about 95-97% on 3 L. He states that she becomes short of breath even with mild exertion. She has a cough and although she feels congested, cough is nonproductive. No complaint of fevers.   Patient was admitted to the step down unit with acute respiratory failure with hypoxia felt to be secondary to an acute COPD exacerbation with bronchitis. Patient was placed on Pulmicort, Brovana, IV steroids, doxycycline, oxygen, scheduled nebulizers, PPI, Tamiflu. Influenza PCR was obtained and is currently pending. Patient improved somewhat however had significant hypoxia on minimal exertion as such CT chest was done which was negative for PE. 2-D echo was done that showed a EF of 55-60%. Pulmicort consultation was obtained and patient was given a dose of IV Lasix with good urine output and clinical improvement. Patient was maintained on current regimen and has been followed by pulmonary.    Assessment/Plan: #1 acute respiratory failure with hypoxia secondary to acute  COPD exacerbation with bronchitis Patient with clinical improvement per patient. Patient still with some expiratory wheezing with fair air movement. Patient speaking in full sentences. Patient states significantly hypoxia on minimal exertion has improved significantly with diuresis.Chest x-ray on admission negative for any acute infiltrate.  Continue Pulmicort, doxycycline, IV Solu-Medrol, oxygen, Mucinex. Discontinued Spiriva. Continue Brovana, Xopenex and Atrovent nebs, claritin,PPI, Tamiflu. CT chest negative for pulmonary emboli. 2-D echo with 55-60%. Normal right ventricular size and systolic function. Normal left ventricular size. Patient is -4.18 L over the past 24 hours and -2.86 L during this hospitalization.  -Respiratory viral panel negative. Tamiflu discontinued. -Ins and outs show she has a net negative fluid balance of 2.8 L. Since there is an upward trend in her creatinine to 1.29 with BUN of 37 Will hold Lasix in a.m.  #2 hypothyroidism  TSH was 0.642.Continue Synthroid.  #3 diabetes mellitus type 2 insulin-dependent Hemoglobin A1c was 6.5 on 01/15/2015. Hemoglobin A1c is 6.2. Continue Trajenta, Lantus, sliding scale insulin.  #4 hyperlipidemia  fasting lipid panel with LDL of 80. Continue Pravachol.  #5 tobacco abuse Tobacco cessation. Continue Chantix.  #6 acute kidney injury  Creatinine trending up to 1.29 with BUN of 37, likely reflecting prerenal azotemia. Will hold Lasix for now.  #7 chronic venous stasis changes/pedal edema Patient takes Lasix as needed. BUN/creatinine ratio was slightly elevated on admission and as such Lasix was held. Patient has been placed on Lasix per pulmonary.   #8 chronic hepatitis C   #9  Elevated blood pressure  Patient states does not have a history of hypertension. Continue Norvasc.  Patient has been placed on  Lasix per pulmonary. Hydralazine as needed.  #10 morbid obesity  #11 thrombocytopenia Likely secondary to acute infection and  possibly secondary to recent antibiotic. Patient also noted to have splenomegaly on CT chest. Check LDH. Check a haptoglobin. Patient with no overt bleeding. Discontinued Lovenox and placed on SCDs.  #12 prophylaxis PPI for GI prophylaxis. SCDs for DVT prophylaxis.  Code Status: Full Family Communication: Updated patient. No family present. Disposition Plan: Anticipate discharge home when medically stable   Consultants:  PCCM: Dr Chase Caller 07/14/2015  Procedures:  Chest x-ray 07/11/2015  CT chest 07/13/2015  2-D echo 07/14/2015  Antibiotics:  Doxycycline 07/11/2015  HPI/Subjective: Reporting feeling better although not quite at her baseline.   Objective: Filed Vitals:   07/16/15 0926 07/16/15 1015  BP: 132/72   Pulse: 92 92  Temp: 98.1 F (36.7 C)   Resp:      Intake/Output Summary (Last 24 hours) at 07/16/15 1247 Last data filed at 07/15/15 1700  Gross per 24 hour  Intake    840 ml  Output      2 ml  Net    838 ml   Filed Weights   07/12/15 0030 07/13/15 0400  Weight: 138.9 kg (306 lb 3.5 oz) 139.6 kg (307 lb 12.2 oz)    Exam:   General:  Nontoxic-appearing no acute distress  Cardiovascular: RRR  Respiratory: Minimal Expiratory wheezing. fair air movement.  Abdomen: Obese, soft, nontender, positive left upper abdominal hernia, positive bowel sounds.  Musculoskeletal: No clubbing or cyanosis. Bilateral chronic venous stasis changes with trace-1+ edema.  Data Reviewed: Basic Metabolic Panel:  Recent Labs Lab 07/12/15 0339 07/13/15 0326 07/13/15 0815 07/14/15 0350 07/15/15 0448 07/16/15 0458  NA 141 137  --  137 137 139  K 4.6 5.5* 5.2* 5.1 4.4 4.3  CL 92* 90*  --  90* 89* 88*  CO2 40* 39*  --  38* 40* 44*  GLUCOSE 180* 175*  --  185* 166* 77  BUN 20 31*  --  33* 34* 37*  CREATININE 0.96 0.92  --  0.95 0.97 1.29*  CALCIUM 9.0 9.0  --  9.2 9.1 9.1  MG  --  1.7  --  1.7 2.2 1.8  PHOS  --   --   --   --  4.9* 5.2*   Liver Function  Tests: No results for input(s): AST, ALT, ALKPHOS, BILITOT, PROT, ALBUMIN in the last 168 hours. No results for input(s): LIPASE, AMYLASE in the last 168 hours. No results for input(s): AMMONIA in the last 168 hours. CBC:  Recent Labs Lab 07/11/15 1859 07/12/15 0339 07/13/15 0326 07/14/15 0350 07/15/15 0448 07/16/15 0458  WBC 6.0 5.4 6.7 7.1 5.7 5.6  NEUTROABS 5.3  --   --   --  5.2 4.1  HGB 14.0 12.9 12.6 13.1 13.1 13.2  HCT 50.0* 45.2 44.0 43.7 42.1 44.0  MCV 93.5 91.9 91.1 88.3 86.8 87.5  PLT 65* 62* 70* 65* 65* 56*   Cardiac Enzymes:  Recent Labs Lab 07/11/15 1724 07/15/15 0448  TROPONINI <0.03 <0.03   BNP (last 3 results)  Recent Labs  07/11/15 1724  BNP 143.1*    ProBNP (last 3 results) No results for input(s): PROBNP in the last 8760 hours.  CBG:  Recent Labs Lab 07/15/15 1207 07/15/15 1649 07/15/15 2043 07/16/15 0738 07/16/15 1214  GLUCAP 178* 171* 97 72 86    Recent Results (from the past 240 hour(s))  MRSA PCR Screening  Status: None   Collection Time: 07/11/15 11:30 PM  Result Value Ref Range Status   MRSA by PCR NEGATIVE NEGATIVE Final    Comment:        The GeneXpert MRSA Assay (FDA approved for NASAL specimens only), is one component of a comprehensive MRSA colonization surveillance program. It is not intended to diagnose MRSA infection nor to guide or monitor treatment for MRSA infections.   Respiratory virus panel     Status: None   Collection Time: 07/13/15  8:45 PM  Result Value Ref Range Status   Respiratory Syncytial Virus A Negative Negative Final   Respiratory Syncytial Virus B Negative Negative Final   Influenza A Negative Negative Final   Influenza B Negative Negative Final   Parainfluenza 1 Negative Negative Final   Parainfluenza 2 Negative Negative Final   Parainfluenza 3 Negative Negative Final   Metapneumovirus Negative Negative Final   Rhinovirus Negative Negative Final   Adenovirus Negative Negative Final     Comment: (NOTE) Performed At: [facility] [place], [place] [id number] Lindon Romp MD [id number]      Studies: No results found.  Scheduled Meds: . amLODipine  10 mg Oral Daily  . antiseptic oral rinse  7 mL Mouth Rinse q12n4p  . budesonide (PULMICORT) nebulizer solution  0.25 mg Nebulization BID  . chlorhexidine  15 mL Mouth Rinse BID  . furosemide  40 mg Oral Daily  . guaiFENesin  1,200 mg Oral BID  . insulin aspart  0-20 Units Subcutaneous TID WC  . insulin aspart  0-5 Units Subcutaneous QHS  . insulin glargine  30 Units Subcutaneous Daily  . ipratropium  0.5 mg Nebulization Q4H  . levalbuterol  0.63 mg Nebulization Q4H  . levothyroxine  75 mcg Oral QHS  . linagliptin  5 mg Oral Daily  . loratadine  10 mg Oral Daily  . nortriptyline  100 mg Oral QHS  . pantoprazole  40 mg Oral Q0600  . pravastatin  20 mg Oral Daily  . sodium chloride flush  3 mL Intravenous Q12H  . varenicline  0.5 mg Oral BID   Followed by  . [START ON 07/17/2015] varenicline  1 mg Oral BID   Continuous Infusions:   Principal Problem:   Respiratory failure, acute (HCC) Active Problems:   COPD GOLDIII with min reversibility    Chronic hepatitis C without hepatic coma (HCC)   Diabetes mellitus, type II, insulin dependent (HCC)   COPD exacerbation (HCC)   Thyroid activity decreased   Nicotine abuse   AKI (acute kidney injury) ([place])   COPD (chronic obstructive pulmonary disease) with acute bronchitis (HCC)   Thrombocytopenia (HCC)   COPD with acute exacerbation ([place])   Hypoxia    Time spent: 30 MINS    Kelvin Cellar MD [facility] Pager [phone]. If 7PM-7AM, please contact night-coverage at [email or web address], password [facility] 07/16/2015, 12:47 PM  LOS: 5 days

## 2015-07-16 NOTE — Progress Notes (Signed)
Occupational Therapy Treatment Patient Details Name: MAYZELL BANKEN MRN: [id number] DOB: 07/10/1955 Today's Date: 07/16/2015    History of present illness admitted with resp distress 07/11/15   OT comments  Pt progressing toward OT goals related to ADL transfers and functional mobility on 2L O2 via nasal cannula today. O2 was noted to decrease from 96% to 88-89% after amb while talking in room. Pt independently performed breathing techniques and O2 returned to 92-95%. Pt is anticipating d/c home tomorrow if medically able.    Follow Up Recommendations  No OT follow up    Equipment Recommendations  Tub/shower bench    Recommendations for Other Services      Precautions / Restrictions Precautions Precaution Comments: on O2        Mobility Bed Mobility               General bed mobility comments: pt in chair  Transfers Overall transfer level: Needs assistance Equipment used: None Transfers: Sit to/from Stand;Stand Pivot Transfers Sit to Stand: Independent Stand pivot transfers: Independent;Modified independent (Device/Increase time)       General transfer comment: No assist, increased time & bnreathing techniques on 2L O2 via nasal cannula    Balance Overall balance assessment: Needs assistance Sitting-balance support: Feet supported Sitting balance-Leahy Scale: Good     Standing balance support: No upper extremity supported;During functional activity Standing balance-Leahy Scale: Good                     ADL Overall ADL's : Needs assistance/impaired     Grooming: Wash/dry hands;Standing;Modified independent                   Toilet Transfer: Ambulation;Comfort height toilet;Modified Independent   Toileting- Clothing Manipulation and Hygiene: Modified independent;Sit to/from Nurse, children's Details (indicate cue type and reason): Handout issued & reviewed for tub bench as pt reports that she has had some difficulty stepping up  into tub recently. Recommend bench for home   General ADL Comments: Pt participated in ADL retraining session today for functional mobility in room and toilet transfers on 2L O2 via nasal cannula.Recommend  tub bench, hand out issued and reviewed.  Pt aware tub bench is private pay. Pt is Independent/Mod I toileting all aspects and plans to d/c home tomorrow if medically able.      Vision                     Perception     Praxis      Cognition   Behavior During Therapy: WFL for tasks assessed/performed Overall Cognitive Status: Within Functional Limits for tasks assessed                       Extremity/Trunk Assessment               Exercises     Shoulder Instructions       General Comments      Pertinent Vitals/ Pain       Pain Assessment: No/denies pain  Home Living                                          Prior Functioning/Environment              Frequency       Progress Toward Goals  OT Goals(current goals can now be found in the care plan section)  Progress towards OT goals: Progressing toward goals  Acute Rehab OT Goals Patient Stated Goal: to go home  Plan Discharge plan remains appropriate    Co-evaluation                 End of Session Equipment Utilized During Treatment: Oxygen   Activity Tolerance Patient tolerated treatment well   Patient Left in chair;with call bell/phone within reach   Nurse Communication          Time: [id number] OT Time Calculation (min): 23 min  Charges: OT General Charges $OT Visit: 1 Procedure OT Treatments $Self Care/Home Management : 23-37 mins  Ridley Dileo Beth Dixon, OTR/L 07/16/2015, 10:49 AM

## 2015-07-16 NOTE — Progress Notes (Signed)
Droplet precautions D/C Per infection prevention RN.

## 2015-07-16 NOTE — Progress Notes (Addendum)
PULMONARY / CRITICAL CARE MEDICINE   Name: Renee Noble MRN: [id number] DOB: 10/01/1955    ADMISSION DATE:  07/11/2015 CONSULTATION DATE:  07/14/15  REFERRING MD:  Dr Irine Seal  CHIEF COMPLAINT:  Slow to resolve AECOPD  BRIEF   60 year old female with severe Gold stage III COPD based on 2015 pulmonary function test that showed FEV1 1.22 L/48% and DLCO 73%. According to the patient back in 2015 based on walking desaturation test in the office she was taken off oxygen. Review of the record shows that she was only walked 2 out of 3 customary laps 185 feet each and stopped when her pulse ox dropped to 89% because of chest tightness. Nevertheless she was taken off oxygen and according to history. Overall she rates her quality of life as 8 out of 10 [10 being excellent]. She was then admitted 07/11/2015 after having a 2 week history of worsening cough and chest tightness. In the pulmonary office on 07/11/2015 she was found desaturating to 84% requiring oxygen. Here in the hospital she's been treated by the hospitalist service with conventional COPD exacerbation management doxycycline and steroids [review the medication list shows that her home Lasix has been held]. Since admission she has improved the subject's score of 7 out of 10 at rest but she finds that she easily desaturates and gets very short of breath when she transfers out of bed and goes to the toilet. She says this is a new finding for her because at baseline she is able to walk a block and a half at home before she gets dyspneic. A CT Angio chest was done and it ruled out pulmonary embolism. Pulmonary critical care medicine has been consulted 07/14/2015 due to acute on chronic hypoxemic respiratory failure and slow to improve COPD exacerbation. s e is on empiric flu rx though nasal PCR flu is negative due to community outbreak    events 2/28 - beginningt to feel better. No longer dyspneic goin gto toilet. Walaked 100 feeet with PT on  3L [place] and desats to 87% - so better but still worse than baseline.  Improvement noticed after starting lasix 07/14/15   SUBJECTIVE/OVERNIGHT/INTERVAL HX 07/16/2015 - feels lasix is helping and conditioning returning to normal. D/ w OT at bedsie - can go home. Desats on 2L [place] to BR and back to 88% . Arlyce Harman today is pending    VIAL SIGNS: BP 132/72 mmHg  Pulse 92  Temp(Src) 98.1 F (36.7 C) (Oral)  Resp 20  Ht 5\' 3"  (1.6 m)  Wt 139.6 kg (307 lb 12.2 oz)  BMI 54.53 kg/m2  SpO2 95%  HEMODYNAMICS:    VENTILATOR SETTINGS:    INTAKE / OUTPUT: I/O last 3 completed shifts: In: 1560 [P.O.:1560] Out: 3 [Urine:3]  PHYSICAL EXAMINATION: General:  Obese lady seated comfortably in the bed Neuro:  Alert and oriented 3. Speech is normal. Glasgow Coma Scale 15. CAM-ICU negative for delirium. Moves all 4. Walkng normally Psych: cheerful.  HEENT:  Obese neck. Mallampati class III-IV oxygen on. Cardiovascular:  Regular rate and rhythm no murmurs Lungs:  Overall diminished air entry due to obesity. No wheeze no crackles Abdomen:  Obese soft nontender Musculoskeletal:  Chronic bilateral 1-2 plus edema present but improved Skin:  Intact in the exposed areas.  LABS:  PULMONARY  Recent Labs Lab 07/15/15 0425  PHART 7.397  PCO2ART 68.0*  PO2ART 96.2  HCO3 41.2*  TCO2 36.8  O2SAT 97.0    CBC  Recent Labs Lab  07/14/15 0350 07/15/15 0448 07/16/15 0458  HGB 13.1 13.1 13.2  HCT 43.7 42.1 44.0  WBC 7.1 5.7 5.6  PLT 65* 65* 56*    COAGULATION No results for input(s): INR in the last 168 hours.  CARDIAC    Recent Labs Lab 07/11/15 1724 07/15/15 0448  TROPONINI <0.03 <0.03   No results for input(s): PROBNP in the last 168 hours.   CHEMISTRY  Recent Labs Lab 07/12/15 0339 07/13/15 0326  07/14/15 0350 07/15/15 0448 07/16/15 0458  NA 141 137  --  137 137 139  K 4.6 5.5*  < > 5.1 4.4 4.3  CL 92* 90*  --  90* 89* 88*  CO2 40* 39*  --  38* 40* 44*  GLUCOSE 180* 175*   --  185* 166* 77  BUN 20 31*  --  33* 34* 37*  CREATININE 0.96 0.92  --  0.95 0.97 1.29*  CALCIUM 9.0 9.0  --  9.2 9.1 9.1  MG  --  1.7  --  1.7 2.2 1.8  PHOS  --   --   --   --  4.9* 5.2*  < > = values in this interval not displayed. Estimated Creatinine Clearance: 63.1 mL/min (by C-G formula based on Cr of 1.29).   LIVER No results for input(s): AST, ALT, ALKPHOS, BILITOT, PROT, ALBUMIN, INR in the last 168 hours.   INFECTIOUS No results for input(s): LATICACIDVEN, PROCALCITON in the last 168 hours.    Recent Labs Lab 07/11/15 1724 07/15/15 0448  TROPONINI <0.03 <0.03    ENDOCRINE CBG (last 3)   Recent Labs  07/15/15 1649 07/15/15 2043 07/16/15 0738  GLUCAP 171* 97 72         IMAGING x48h  - image(s) personally visualized  -   highlighted in bold No results found.   Anti-infectives    Start     Dose/Rate Route Frequency Ordered Stop   07/12/15 1000  oseltamivir (TAMIFLU) capsule 75 mg     75 mg Oral 2 times daily 07/12/15 0940 07/17/15 0959   07/11/15 2200  doxycycline (VIBRA-TABS) tablet 100 mg     100 mg Oral Every 12 hours 07/11/15 1844 07/16/15 2159   07/11/15 1715  doxycycline (VIBRAMYCIN) 100 mg in dextrose 5 % 250 mL IVPB     100 mg 125 mL/hr over 120 Minutes Intravenous  Once 07/11/15 1705 07/11/15 2247     Results for orders placed or performed during the hospital encounter of 07/11/15  MRSA PCR Screening     Status: None   Collection Time: 07/11/15 11:30 PM  Result Value Ref Range Status   MRSA by PCR NEGATIVE NEGATIVE Final    Comment:        The GeneXpert MRSA Assay (FDA approved for NASAL specimens only), is one component of a comprehensive MRSA colonization surveillance program. It is not intended to diagnose MRSA infection nor to guide or monitor treatment for MRSA infections.   Respiratory virus panel     Status: None   Collection Time: 07/13/15  8:45 PM  Result Value Ref Range Status   Respiratory Syncytial Virus A  Negative Negative Final   Respiratory Syncytial Virus B Negative Negative Final   Influenza A Negative Negative Final   Influenza B Negative Negative Final   Parainfluenza 1 Negative Negative Final   Parainfluenza 2 Negative Negative Final   Parainfluenza 3 Negative Negative Final   Metapneumovirus Negative Negative Final   Rhinovirus Negative Negative Final   Adenovirus  Negative Negative Final    Comment: (NOTE) Performed At: [facility] [address] [place], [place] [id number] Lindon Romp MD [id number]      ECHO 2/27 - GR1 diast dysfhn  ASSESSMENT / PLAN:  PULMONARY A: #Baseline: Gold stage III COPD with borderline exertional desaturations as of 2015  #Current - Acute COPD exacerbation with hypoxemic respiratory failure requiring 2-4 liters oxygen at rest -likely viral exacerbation even though flu PCR is negative and resp virus pcr negative - Pulmonary embolism ruled out  - Other differential diagnosis includes acute on chronic diastolic heart failure [trace pleural effusion on CT] and viral mediated hypoxemia  - Slow to improve but this is par for the course  -definite subjective and objective improvement with dyspnea/ex hypoxemia 2/28 after  Starting lasix 2/27 - so suspect component of acute diast chf in setting of aecopd, obesity and steroids. This is unchanged 07/16/15   P:   Continue o2 - needs it at dc - will walk her to determine her needs - imight take weeks to months to improve or this could be permanent ("hit an run") Await pre-dc spiroemtry Continue bronchodilation- frequency to every 4 hours -> recommend dc on duoneb schedule continue steroids - 2 week taper recommended - TRH to do it agre with doxy - end at f 5days  07/16/2015  DC tamiflu Lasix daily - TRH to adjust to home needs at dc Aggressive pulmonary toilet     Noted: Sometimes vital COPD exacerbation can leave patients permanently hypoxemic because of "hit and run" mechanisms  related to viral and airway epithelia  RENAL A:   Mild hypomagnesemia less than 2 g percent   P:   Replete again Monitor  [facility] Future Appointments Date Time Provider [place]  07/28/2015 10:00 AM Donita Brooks, NP [facility] None    PCCM will sign off  Dr. Brand Males, M.D., [facility].C.P Pulmonary and Critical Care Medicine Staff Physician [facility] Pager: [phone], If no answer or between  15:00h - 7:00h: call [phone]  07/16/2015 10:39 AM

## 2015-07-17 LAB — GLUCOSE, CAPILLARY: GLUCOSE-CAPILLARY: 83 mg/dL (ref 65–99)

## 2015-07-17 LAB — PHOSPHORUS: PHOSPHORUS: 5.1 mg/dL — AB (ref 2.5–4.6)

## 2015-07-17 LAB — BASIC METABOLIC PANEL
Anion gap: 8 (ref 5–15)
BUN: 40 mg/dL — AB (ref 6–20)
CALCIUM: 8.7 mg/dL — AB (ref 8.9–10.3)
CO2: 40 mmol/L — ABNORMAL HIGH (ref 22–32)
CREATININE: 1.14 mg/dL — AB (ref 0.44–1.00)
Chloride: 92 mmol/L — ABNORMAL LOW (ref 101–111)
GFR calc Af Amer: 59 mL/min — ABNORMAL LOW (ref >60)
GFR, EST NON AFRICAN AMERICAN: 51 mL/min — AB (ref >60)
Glucose, Bld: 98 mg/dL (ref 65–99)
POTASSIUM: 4.1 mmol/L (ref 3.5–5.1)
SODIUM: 140 mmol/L (ref 135–145)

## 2015-07-17 LAB — CBC WITH DIFFERENTIAL/PLATELET
Basophils Absolute: 0 10*3/uL (ref 0.0–0.1)
Basophils Relative: 0 %
EOS ABS: 0 10*3/uL (ref 0.0–0.7)
EOS PCT: 1 %
HCT: 43.2 % (ref 36.0–46.0)
Hemoglobin: 13.1 g/dL (ref 12.0–15.0)
LYMPHS ABS: 1.3 10*3/uL (ref 0.7–4.0)
Lymphocytes Relative: 19 %
MCH: 26.8 pg (ref 26.0–34.0)
MCHC: 30.3 g/dL (ref 30.0–36.0)
MCV: 88.5 fL (ref 78.0–100.0)
MONO ABS: 0.3 10*3/uL (ref 0.1–1.0)
Monocytes Relative: 5 %
Neutro Abs: 5.2 10*3/uL (ref 1.7–7.7)
Neutrophils Relative %: 75 %
PLATELETS: 61 10*3/uL — AB (ref 150–400)
RBC: 4.88 MIL/uL (ref 3.87–5.11)
RDW: 17.1 % — AB (ref 11.5–15.5)
WBC: 6.9 10*3/uL (ref 4.0–10.5)

## 2015-07-17 LAB — MAGNESIUM: Magnesium: 1.9 mg/dL (ref 1.7–2.4)

## 2015-07-17 MED ORDER — FUROSEMIDE 20 MG PO TABS
20.0000 mg | ORAL_TABLET | Freq: Every day | ORAL | Status: DC
  Administered 2015-07-17: 20 mg via ORAL
  Filled 2015-07-17: qty 1

## 2015-07-17 NOTE — Discharge Summary (Signed)
Physician Discharge Summary  Renee Noble [id number] DOB: [birth year] DOA: 07/11/2015  PCP: [name], PA-C  Admit date: 07/11/2015 Discharge date: 07/17/2015  Time spent: 35 minutes  Recommendations for Outpatient Follow-up:  1. Please follow up on respiratory status she was admitted for COPD exacerbaiton 2. Discharged on supplemental oxygen 3. Follow up on volume status, she was treated with volume overload during this hospitalization with IV lasix, discharged on her home regimen with Lasix 20 mg PO q daily 4. Follow up on BMP on hospital follow up, had mild AKI likely secondary to lasix.    Discharge Diagnoses:  Principal Problem:   Respiratory failure, acute ([place]) Active Problems:   COPD GOLDIII with min reversibility    Chronic hepatitis C without hepatic coma (HCC)   Diabetes mellitus, type II, insulin dependent (HCC)   COPD exacerbation (HCC)   Thyroid activity decreased   Nicotine abuse   AKI (acute kidney injury) ([place])   COPD (chronic obstructive pulmonary disease) with acute bronchitis (HCC)   Thrombocytopenia (HCC)   COPD with acute exacerbation ([place])   Hypoxia   Discharge Condition: Stable  Diet recommendation: Heart Healthy  Filed Weights   07/12/15 0030 07/13/15 0400  Weight: 138.9 kg (306 lb 3.5 oz) 139.6 kg (307 lb 12.2 oz)    History of present illness:  Renee Noble is a 60 y.o. female with COPD not on home O2, insulin requiring diabetes mellitus, renal cancer status post left nephrectomy, hepatitis C, chronic pedal edema, hypertension, morbid obesity who presents to the hospital for cough shortness of breath and wheezing. The patient states that she has been sick with these symptoms for almost 2 weeks now. She was seen by pulmonary on 2/17 and started on prednisone and Augmentin. She has been using nebulizers at home. She is usually not on oxygen at baseline but has been requiring 2-3 L for the past couple of weeks. Despite these measures, her  symptoms have not improved. She returned to the pulmonary office today and was evaluated and told to go to the ER to be admitted. In the ER she is noted to be wheezing. Oxygen saturation is about 95-97% on 3 L. He states that she becomes short of breath even with mild exertion. She has a cough and although she feels congested, cough is nonproductive. No complaint of fevers.  Hospital Course:  Initially patient was admitted to the step down unit with acute respiratory failure with hypoxia which was  felt to be secondary to an acute COPD exacerbation with bronchitis. She was placed on Pulmicort, Brovana, IV steroids, doxycycline, oxygen, scheduled nebulizers, PPI. Flu swab was negative.She improved somewhat however had significant hypoxia on minimal exertion as such CT chest was done which was negative for PE. 2-D echo was done that showed a EF of 55-60%. Pulmicort consultation was obtained and patient was given a dose of IV Lasix with good urine output and clinical improvement.  By 07/17/2015 she was at her baseline and stated feeling well enough to go home. She was discharged on her home regimen of lasix at 20 mg Po q daily, please follow up on her volume status on her hospital follow up visit. A follow BMP should also be checked.   Consultations:  Pulmonary Medicine  Discharge Exam: Filed Vitals:   07/17/15 0536 07/17/15 0830  BP: 94/57 116/75  Pulse: 96 95  Temp: 98 F (36.7 C) 97.9 F (36.6 C)  Resp: 20 20    General: Nontoxic-appearing no acute  distress, wants to go home today  Cardiovascular: RRR  Respiratory: Lungs clear, no wheezing rhonchi rales, diminished breath sounds.   Abdomen: Obese, soft, nontender, positive left upper abdominal hernia, positive bowel sounds.  Musculoskeletal: No clubbing or cyanosis. Bilateral chronic venous stasis changes with trace-1+ edema.  Discharge Instructions   Discharge Instructions    Call MD for:  difficulty breathing, headache or visual  disturbances    Complete by:  As directed      Call MD for:  extreme fatigue    Complete by:  As directed      Call MD for:  hives    Complete by:  As directed      Call MD for:  persistant dizziness or light-headedness    Complete by:  As directed      Call MD for:  persistant nausea and vomiting    Complete by:  As directed      Call MD for:  redness, tenderness, or signs of infection (pain, swelling, redness, odor or green/yellow discharge around incision site)    Complete by:  As directed      Call MD for:  severe uncontrolled pain    Complete by:  As directed      Call MD for:  temperature >100.4    Complete by:  As directed      Call MD for:    Complete by:  As directed      Diet - low sodium heart healthy    Complete by:  As directed      Increase activity slowly    Complete by:  As directed           Current Discharge Medication List    CONTINUE these medications which have NOT CHANGED   Details  albuterol (PROVENTIL HFA;VENTOLIN HFA) 108 (90 Base) MCG/ACT inhaler Inhale 1-2 puffs into the lungs every 6 (six) hours as needed for wheezing. Qty: 1 Inhaler, Refills: 0    albuterol (PROVENTIL) (2.5 MG/3ML) 0.083% nebulizer solution Take 3 mLs (2.5 mg total) by nebulization every 6 (six) hours as needed for wheezing or shortness of breath. Qty: 75 mL, Refills: 12    clotrimazole-betamethasone (LOTRISONE) cream Apply 1 application topically 2 (two) times daily. Qty: 30 g, Refills: 0   Associated Diagnoses: Tinea corporis    Fluticasone-Salmeterol (ADVAIR DISKUS) 500-50 MCG/DOSE AEPB Inhale 1 puff into the lungs once. Qty: 60 each, Refills: 5    furosemide (LASIX) 20 MG tablet Take 1 tablet (20 mg total) by mouth daily as needed. Qty: 30 tablet, Refills: 0    insulin glargine (LANTUS) 100 UNIT/ML injection Inject 0.3 mLs (30 Units total) into the skin daily. Qty: 10 mL, Refills: 0   Associated Diagnoses: Diabetes mellitus, type II, insulin dependent (HCC)    insulin  lispro (HUMALOG) 100 UNIT/ML injection Inject 0.15 mLs (15 Units total) into the skin 3 (three) times daily before meals. Qty: 10 mL, Refills: 5   Associated Diagnoses: Diabetes mellitus, type II, insulin dependent (HCC)    levothyroxine (SYNTHROID, LEVOTHROID) 75 MCG tablet Take 1 tablet (75 mcg total) by mouth daily before breakfast. Qty: 90 tablet, Refills: 1    nortriptyline (PAMELOR) 75 MG capsule Take 1 capsule (75 mg total) by mouth at bedtime. Qty: 30 capsule, Refills: 2   Associated Diagnoses: Encounter for chronic pain management    nystatin (MYCOSTATIN/NYSTOP) 100000 UNIT/GM POWD Apply 1 Bottle topically 2 (two) times daily as needed (yeast infection).     Oxycodone HCl 10  MG TABS Take 1 tablet (10 mg total) by mouth every 8 (eight) hours as needed. Qty: 90 tablet, Refills: 0   Associated Diagnoses: Encounter for chronic pain management    pravastatin (PRAVACHOL) 20 MG tablet Take 1 tablet (20 mg total) by mouth daily. Qty: 30 tablet, Refills: 2   Associated Diagnoses: Hyperlipidemia LDL goal <100    sitaGLIPtin (JANUVIA) 50 MG tablet Take 1 tablet (50 mg total) by mouth daily. Qty: 90 tablet, Refills: 1   Associated Diagnoses: Diabetes mellitus, type II, insulin dependent (HCC)    tiotropium (SPIRIVA) 18 MCG inhalation capsule Place 1 capsule (18 mcg total) into inhaler and inhale daily. Qty: 30 capsule, Refills: 0    varenicline (CHANTIX STARTING MONTH PAK) 0.5 MG X 11 & 1 MG X 42 tablet Take one 0.5 mg tablet by mouth once daily for 3 days, then increase to one 0.5 mg tablet twice daily for 4 days, then increase to one 1 mg tablet twice daily. Qty: 53 tablet, Refills: 0    baclofen (LIORESAL) 10 MG tablet Take 10 mg by mouth 2 (two) times daily as needed for muscle spasms.     Insulin Syringe-Needle U-100 30G X 1/2" 1 ML MISC Use 4 times daily as directed. Qty: 100 each, Refills: 3   Associated Diagnoses: Diabetes mellitus, type II, insulin dependent ([place])       STOP taking these medications     amoxicillin-clavulanate (AUGMENTIN) 875-125 MG tablet      predniSONE (DELTASONE) 10 MG tablet        Allergies  Allergen Reactions  . Gabapentin Anaphylaxis  . Lyrica [Pregabalin] Shortness Of Breath    Trouble breathing  . Ketorolac Tromethamine Hives  . Lisinopril Cough   Follow-up Information    Follow up with [name], PA-C In 1 week.   Specialty:  Family Medicine   Contact information:   [place] [address] [phone]       Follow up with Christinia Gully, MD In 2 weeks.   Specialty:  Pulmonary Disease   Contact information:   [address] [place] [address] [phone]        The results of significant diagnostics from this hospitalization (including imaging, microbiology, ancillary and laboratory) are listed below for reference.    Significant Diagnostic Studies: Dg Chest 2 View  07/11/2015  CLINICAL DATA:  Shortness of breath, cough and congestion for 2 weeks, hypertension, diabetes mellitus, smoker EXAM: CHEST  2 VIEW COMPARISON:  07/04/2015 ; correlation CTA chest 06/20/2013 FINDINGS: Enlargement of cardiac silhouette with slight pulmonary vascular congestion Mediastinal contours normal. 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 Peribronchial thickening without gross infiltrate, pleural effusion or pneumothorax. Scattered endplate spur formation thoracic spine. IMPRESSION: Enlargement of cardiac silhouette with pulmonary vascular congestion. Bronchitic changes without infiltrate. LEFT diaphragmatic hernia, stable. Electronically Signed   By: Lavonia Dana M.D.   On: 07/11/2015 16:29   Dg Chest 2 View  07/04/2015  CLINICAL DATA:  60 year old female with cough. Former smoker. Initial encounter. EXAM: CHEST  2 VIEW COMPARISON:  06/18/2015 and earlier. FINDINGS: Fat containing Bochdalek hernia seen along the left costophrenic angle on CT  Abdomen and Pelvis 08/23/2014. Stable lung volumes. Stable cardiac size and mediastinal contours. Visualized tracheal air column is within normal limits. Chronic somewhat coarse increased interstitial markings appear stable. No pneumothorax, pulmonary edema, pleural effusion or acute pulmonary opacity. No acute osseous abnormality identified.  IMPRESSION: Stable, including left costophrenic angle Bochdalek hernia. No acute cardiopulmonary abnormality. Electronically Signed   By: Genevie Ann M.D.   On: 07/04/2015 13:07   Dg Chest 2 View  06/18/2015  CLINICAL DATA:  Shortness of breath. EXAM: CHEST  2 VIEW COMPARISON:  Radiograph of February 14, 2014. CT scan of June 20, 2013. FINDINGS: The heart size and mediastinal contours are within normal limits. Both lungs are clear. No pneumothorax or pleural effusion is noted. Stable left posterior basilar opacity is noted consistent with fatty diaphragmatic hernia as seen on prior CT. The visualized skeletal structures are unremarkable. IMPRESSION: No active cardiopulmonary disease. Electronically Signed   By: Marijo Conception, M.D.   On: 06/18/2015 16:28   Ct Angio Chest Pe W/cm &/or Wo Cm  07/13/2015  CLINICAL DATA:  Chronic worsening shortness of breath, with decreased O2 saturation. Initial encounter. EXAM: CT ANGIOGRAPHY CHEST WITH CONTRAST TECHNIQUE: Multidetector CT imaging of the chest was performed using the standard protocol during bolus administration of intravenous contrast. Multiplanar CT image reconstructions and MIPs were obtained to evaluate the vascular anatomy. CONTRAST:  158mL OMNIPAQUE IOHEXOL 350 MG/ML SOLN COMPARISON:  Chest radiograph performed 07/11/2015, and CTA of the chest performed 06/20/2013 FINDINGS: There is no evidence of pulmonary embolus. A trace left pleural effusion is noted, with mild left basilar opacity likely reflecting atelectasis. Mild scarring is noted at the posterior aspect of the right upper lobe. Minimal nonspecific  peribronchovascular opacity within the right upper lobe may reflect chronic changes of infection. There is no evidence of pneumothorax. No masses are identified; no abnormal focal contrast enhancement is seen. Scattered calcification is noted at the mitral and aortic valves. Diffuse coronary artery calcifications are seen. Scattered calcification is noted along the proximal great vessels and aortic arch. No pericardial effusion is identified. No mediastinal lymphadenopathy is seen. No axillary lymphadenopathy is seen. The visualized portions of the thyroid gland are unremarkable in appearance. The spleen is diffusely enlarged, measuring up to 20.5 cm in length. The liver also appears mildly prominent. The patient is status post median sternotomy, with evidence of prior CABG. The visualized portions of the pancreas, adrenal glands and right kidney are grossly unremarkable. No acute osseous abnormalities are seen. Review of the MIP images confirms the above findings. IMPRESSION: 1. No evidence of pulmonary embolus. 2. Minimal nonspecific peribronchial vascular opacity within the right upper lobe may reflect chronic changes of infection, new from 2015. 3. Trace left pleural effusion, with mild left basilar airspace opacity likely reflecting atelectasis. Mild scarring at the posterior aspect of the right upper lobe is stable in appearance. 4. Diffuse coronary artery calcifications seen. Scattered calcification at the mitral and aortic valves. 5. Significant splenomegaly noted. Electronically Signed   By: Garald Balding M.D.   On: 07/13/2015 22:50    Microbiology: Recent Results (from the past 240 hour(s))  MRSA PCR Screening     Status: None   Collection Time: 07/11/15 11:30 PM  Result Value Ref Range Status   MRSA by PCR NEGATIVE NEGATIVE Final    Comment:        The GeneXpert MRSA Assay (FDA approved for NASAL specimens only), is one component of a comprehensive MRSA colonization surveillance program. It  is not intended to diagnose MRSA infection nor to guide or monitor treatment for MRSA infections.   Respiratory virus panel     Status: None   Collection Time: 07/13/15  8:45 PM  Result Value Ref Range Status   Respiratory Syncytial  Virus A Negative Negative Final   Respiratory Syncytial Virus B Negative Negative Final   Influenza A Negative Negative Final   Influenza B Negative Negative Final   Parainfluenza 1 Negative Negative Final   Parainfluenza 2 Negative Negative Final   Parainfluenza 3 Negative Negative Final   Metapneumovirus Negative Negative Final   Rhinovirus Negative Negative Final   Adenovirus Negative Negative Final    Comment: (NOTE) Performed At: [facility] [address] [place], [place] [id number] Lindon Romp MD [id number]      Labs: Basic Metabolic Panel:  Recent Labs Lab 07/13/15 0326 07/13/15 0815 07/14/15 0350 07/15/15 0448 07/16/15 0458 07/17/15 0520  NA 137  --  137 137 139 140  K 5.5* 5.2* 5.1 4.4 4.3 4.1  CL 90*  --  90* 89* 88* 92*  CO2 39*  --  38* 40* 44* 40*  GLUCOSE 175*  --  185* 166* 77 98  BUN 31*  --  33* 34* 37* 40*  CREATININE 0.92  --  0.95 0.97 1.29* 1.14*  CALCIUM 9.0  --  9.2 9.1 9.1 8.7*  MG 1.7  --  1.7 2.2 1.8 1.9  PHOS  --   --   --  4.9* 5.2* 5.1*   Liver Function Tests: No results for input(s): AST, ALT, ALKPHOS, BILITOT, PROT, ALBUMIN in the last 168 hours. No results for input(s): LIPASE, AMYLASE in the last 168 hours. No results for input(s): AMMONIA in the last 168 hours. CBC:  Recent Labs Lab 07/11/15 1859  07/13/15 0326 07/14/15 0350 07/15/15 0448 07/16/15 0458 07/17/15 0520  WBC 6.0  < > 6.7 7.1 5.7 5.6 6.9  NEUTROABS 5.3  --   --   --  5.2 4.1 5.2  HGB 14.0  < > 12.6 13.1 13.1 13.2 13.1  HCT 50.0*  < > 44.0 43.7 42.1 44.0 43.2  MCV 93.5  < > 91.1 88.3 86.8 87.5 88.5  PLT 65*  < > 70* 65* 65* 56* 61*  < > = values in this interval not displayed. Cardiac Enzymes:  Recent  Labs Lab 07/11/15 1724 07/15/15 0448  TROPONINI <0.03 <0.03   BNP: BNP (last 3 results)  Recent Labs  07/11/15 1724  BNP 143.1*    ProBNP (last 3 results) No results for input(s): PROBNP in the last 8760 hours.  CBG:  Recent Labs Lab 07/16/15 0738 07/16/15 1214 07/16/15 1632 07/16/15 2047 07/17/15 0734  GLUCAP 72 86 116* 110* 83       Signed:  Kelvin Cellar MD.  [facility] 07/17/2015, 9:50 AM

## 2015-07-17 NOTE — Progress Notes (Signed)
07/17/15  1045  Reviewed discharge instructions with patient. Patient verbalized understanding of discharge instructions. Copy of discharge instructions given to patient.

## 2015-07-18 ENCOUNTER — Telehealth: Payer: Self-pay | Admitting: *Deleted

## 2015-07-18 NOTE — Telephone Encounter (Signed)
Transition Care Management Follow-up Telephone Call  KEYMANI BROCKSCHMIDT [id number] DOB: 05/03/1956 DOA: 07/11/2015  PCP: [name], PA-C  Admit date: 07/11/2015 Discharge date: 07/17/2015  Time spent: 35 minutes  Recommendations for Outpatient Follow-up:  1. Please follow up on respiratory status she was admitted for COPD exacerbaiton 2. Discharged on supplemental oxygen 3. Follow up on volume status, she was treated with volume overload during this hospitalization with IV lasix, discharged on her home regimen with Lasix 20 mg PO q daily 4. Follow up on BMP on hospital follow up, had mild AKI likely secondary to lasix.   Discharge Diagnoses:  Principal Problem:  Respiratory failure, acute ([place]) Active Problems:  COPD GOLDIII with min reversibility   Chronic hepatitis C without hepatic coma (HCC)  Diabetes mellitus, type II, insulin dependent (HCC)  COPD exacerbation (HCC)  Thyroid activity decreased  Nicotine abuse  AKI (acute kidney injury) ([place])  COPD (chronic obstructive pulmonary disease) with acute bronchitis (HCC)  Thrombocytopenia (HCC)  COPD with acute exacerbation ([place])  Hypoxia   Discharge Condition: Stable  Diet recommendation: Heart Healthy  How have you been since you were released from the hospital? "Okay, I'm a little tired but I'm ok. I feel much better."   Do you understand why you were in the hospital? yes   Do you understand the discharge instructions? yes   Where were you discharged to? Home   Items Reviewed:  Medications reviewed: yes  Allergies reviewed: yes  Dietary changes reviewed: no  Referrals reviewed: no, none made   Functional Questionnaire:   Activities of Daily Living (ADLs):   She states they are independent in the following: ambulation, bathing and hygiene, feeding, continence, grooming, toileting and dressing States they require assistance with the following: None   Any transportation  issues/concerns?: no, "I just have to make sure I can coordinate a ride."   Any patient concerns? no   Confirmed importance and date/time of follow-up visits scheduled yes  Provider Appointment booked with [name], PA-C 07/23/15 @ 2:30pm  Confirmed with patient if condition begins to worsen call PCP or go to the ER.  Patient was given the office number and encouraged to call back with question or concerns.  : yes

## 2015-07-22 ENCOUNTER — Other Ambulatory Visit: Payer: Self-pay | Admitting: *Deleted

## 2015-07-22 MED ORDER — TIOTROPIUM BROMIDE MONOHYDRATE 18 MCG IN CAPS: 18.0000 ug | ORAL_CAPSULE | Freq: Every day | RESPIRATORY_TRACT | Status: DC

## 2015-07-22 NOTE — Telephone Encounter (Signed)
Rx sent to the pharmacy by e-script.//AB/CMA 

## 2015-07-23 ENCOUNTER — Encounter: Payer: Self-pay | Admitting: Physician Assistant

## 2015-07-23 ENCOUNTER — Ambulatory Visit (INDEPENDENT_AMBULATORY_CARE_PROVIDER_SITE_OTHER): Payer: Medicare Other | Admitting: Physician Assistant

## 2015-07-23 VITALS — BP 130/60 | HR 106 | Temp 98.1°F | Ht 64.0 in | Wt 294.0 lb

## 2015-07-23 DIAGNOSIS — J441 Chronic obstructive pulmonary disease with (acute) exacerbation: Secondary | ICD-10-CM

## 2015-07-23 MED ORDER — ALBUTEROL SULFATE HFA 108 (90 BASE) MCG/ACT IN AERS: 1.0000 | INHALATION_SPRAY | Freq: Four times a day (QID) | RESPIRATORY_TRACT | Status: DC | PRN

## 2015-07-23 NOTE — Progress Notes (Signed)
Patient presents to clinic today for hospital follow-up for COPD exacerbation. Patient was admitted to hospital after unsuccessful attempts to treat COPD exacerbation outpatient. Workup included CTA which was negative for PE. Patient placed on antibiotics, IV steroids and medications were tweaked by Pulmonary. Patient discharged home on O2 at 2L. Patient endorses doing very well since discharge. Is wearing O2 as directed. Is not requiring additional O2 with exertion. Is using her inhalers as directed. Has scheduled follow-up with Pulmonary. Patient also noted to have edema during hospital stay. Was given IV lasix with resolution of edema. Is taking Lasix 20 mg daily. Is weighing daily and denies weight gain. Denies orthopnea or PND. Denies new symptoms or acute concerns at today's visit.   Past Medical History  Diagnosis Date  . Diabetes ([place])     Type II  . Neuropathy ([place])     Diabetes  . COPD (chronic obstructive pulmonary disease) ([place])   . Renal cancer ([place])     Left Kidney Removed  . Fibromyalgia   . Hepatitis C   . Abdominal mass of other site   . Chronic kidney disease, stage 3     Borderline Stage 2-3  . Depression   . Cervical compression fracture ([place])   . Hypercholesteremia   . Syncope   . Osteoarthritis   . Hypertension   . Hypothyroidism   . Chronic lower limb pain   . Morbid obesity ([place])   . CTS (carpal tunnel syndrome)   . RLS (restless legs syndrome)   . Venous stasis   . OSA (obstructive sleep apnea)     Current Outpatient Prescriptions on File Prior to Visit  Medication Sig Dispense Refill  . baclofen (LIORESAL) 10 MG tablet Take 10 mg by mouth 2 (two) times daily as needed for muscle spasms.     . clotrimazole-betamethasone (LOTRISONE) cream Apply 1 application topically 2 (two) times daily. 30 g 0  . Fluticasone-Salmeterol (ADVAIR DISKUS) 500-50 MCG/DOSE AEPB Inhale 1 puff into the lungs once. 60 each 5  . furosemide (LASIX) 20 MG tablet Take 1 tablet  (20 mg total) by mouth daily as needed. (Patient taking differently: Take 20 mg by mouth daily. ) 30 tablet 0  . insulin glargine (LANTUS) 100 UNIT/ML injection Inject 0.3 mLs (30 Units total) into the skin daily. 10 mL 0  . insulin lispro (HUMALOG) 100 UNIT/ML injection Inject 0.15 mLs (15 Units total) into the skin 3 (three) times daily before meals. (Patient taking differently: Inject 15 Units into the skin 3 (three) times daily as needed for high blood sugar. ) 10 mL 5  . Insulin Syringe-Needle U-100 30G X 1/2" 1 ML MISC Use 4 times daily as directed. 100 each 3  . levothyroxine (SYNTHROID, LEVOTHROID) 75 MCG tablet Take 1 tablet (75 mcg total) by mouth daily before breakfast. 90 tablet 1  . nortriptyline (PAMELOR) 75 MG capsule Take 1 capsule (75 mg total) by mouth at bedtime. 30 capsule 2  . nystatin (MYCOSTATIN/NYSTOP) 100000 UNIT/GM POWD Apply 1 Bottle topically 2 (two) times daily as needed (yeast infection).     . Oxycodone HCl 10 MG TABS Take 1 tablet (10 mg total) by mouth every 8 (eight) hours as needed. 90 tablet 0  . pravastatin (PRAVACHOL) 20 MG tablet Take 1 tablet (20 mg total) by mouth daily. 30 tablet 2  . sitaGLIPtin (JANUVIA) 50 MG tablet Take 1 tablet (50 mg total) by mouth daily. 90 tablet 1  . tiotropium (SPIRIVA) 18 MCG  inhalation capsule Place 1 capsule (18 mcg total) into inhaler and inhale daily. 30 capsule 11   No current facility-administered medications on file prior to visit.    Allergies  Allergen Reactions  . Gabapentin Anaphylaxis  . Lyrica [Pregabalin] Shortness Of Breath    Trouble breathing  . Ketorolac Tromethamine Hives  . Lisinopril Cough    Family History  Problem Relation Age of Onset  . Heart attack Mother 34    Deceased  . Heart disease Mother   . Emphysema Mother   . COPD Father 79    Deceased  . Emphysema Father   . Alcoholism Father   . Alcoholism Mother   . Esophageal varices Father   . Alcoholism Paternal Grandfather   . Diabetes  Maternal Grandmother   . Heart disease Maternal Grandmother   . Lung cancer Maternal Grandfather   . Emphysema Maternal Grandfather   . Brain cancer Maternal Aunt   . Diabetes Sister   . Heart defect Sister   . Obesity Son   . Breast cancer Maternal Aunt     Social History   Social History  . Marital Status: Single    Spouse Name: N/A  . Number of Children: N/A  . Years of Education: N/A   Social History Main Topics  . Smoking status: Former Smoker -- 2.50 packs/day for 45 years    Types: Cigarettes    Quit date: 09/25/2013  . Smokeless tobacco: Never Used  . Alcohol Use: No  . Drug Use: No  . Sexual Activity: No   Other Topics Concern  . None   Social History Narrative    Review of Systems - See HPI.  All other ROS are negative.  BP 130/60 mmHg  Pulse 106  Temp(Src) 98.1 F (36.7 C) (Other (Comment))  Ht 5' 4" (1.626 m)  Wt 294 lb (133.358 kg)  BMI 50.44 kg/m2  SpO2 95%  Physical Exam  Constitutional: She is oriented to person, place, and time and well-developed, well-nourished, and in no distress.  HENT:  Head: Normocephalic and atraumatic.  Eyes: Conjunctivae are normal. Pupils are equal, round, and reactive to light.  Neck: Neck supple.  Cardiovascular: Normal rate, regular rhythm, normal heart sounds and intact distal pulses.   No noted peripheral edema on examination  Pulmonary/Chest: Effort normal and breath sounds normal. No respiratory distress. She has no wheezes. She has no rales. She exhibits no tenderness.  Neurological: She is alert and oriented to person, place, and time.  Skin: Skin is warm and dry. No rash noted.  Psychiatric: Affect normal.  Vitals reviewed.   Recent Results (from the past 2160 hour(s))  CBC with Differential     Status: Abnormal   Collection Time: 06/18/15  3:00 PM  Result Value Ref Range   WBC 8.0 4.0 - 10.5 K/uL   RBC 5.12 (H) 3.87 - 5.11 MIL/uL   Hemoglobin 13.2 12.0 - 15.0 g/dL   HCT 44.5 36.0 - 46.0 %   MCV  86.9 78.0 - 100.0 fL   MCH 25.8 (L) 26.0 - 34.0 pg   MCHC 29.7 (L) 30.0 - 36.0 g/dL   RDW 17.0 (H) 11.5 - 15.5 %   Platelets 83 (L) 150 - 400 K/uL    Comment: PLATELET COUNT CONFIRMED BY SMEAR SPECIMEN CHECKED FOR CLOTS REPEATED TO VERIFY    Neutrophils Relative % 75 %   Neutro Abs 6.0 1.7 - 7.7 K/uL   Lymphocytes Relative 17 %   Lymphs Abs 1.3 0.7 -  4.0 K/uL   Monocytes Relative 8 %   Monocytes Absolute 0.6 0.1 - 1.0 K/uL   Eosinophils Relative 1 %   Eosinophils Absolute 0.1 0.0 - 0.7 K/uL   Basophils Relative 0 %   Basophils Absolute 0.0 0.0 - 0.1 K/uL  Comprehensive metabolic panel     Status: Abnormal   Collection Time: 06/18/15  3:00 PM  Result Value Ref Range   Sodium 138 135 - 145 mmol/L   Potassium 3.8 3.5 - 5.1 mmol/L   Chloride 92 (L) 101 - 111 mmol/L   CO2 37 (H) 22 - 32 mmol/L   Glucose, Bld 127 (H) 65 - 99 mg/dL   BUN 19 6 - 20 mg/dL   Creatinine, Ser 0.99 0.44 - 1.00 mg/dL   Calcium 9.2 8.9 - 10.3 mg/dL   Total Protein 7.9 6.5 - 8.1 g/dL   Albumin 3.9 3.5 - 5.0 g/dL   AST 18 15 - 41 U/L   ALT 14 14 - 54 U/L   Alkaline Phosphatase 69 38 - 126 U/L   Total Bilirubin 0.3 0.3 - 1.2 mg/dL   GFR calc non Af Amer >60 >60 mL/min   GFR calc Af Amer >60 >60 mL/min    Comment: (NOTE) The eGFR has been calculated using the CKD EPI equation. This calculation has not been validated in all clinical situations. eGFR's persistently <60 mL/min signify possible Chronic Kidney Disease.    Anion gap 9 5 - 15  Troponin I     Status: None   Collection Time: 06/18/15  3:00 PM  Result Value Ref Range   Troponin I <0.03 <0.031 ng/mL    Comment:        NO INDICATION OF MYOCARDIAL INJURY.   Basic metabolic panel     Status: Abnormal   Collection Time: 07/11/15  5:24 PM  Result Value Ref Range   Sodium 142 135 - 145 mmol/L   Potassium 5.0 3.5 - 5.1 mmol/L   Chloride 92 (L) 101 - 111 mmol/L   CO2 43 (H) 22 - 32 mmol/L   Glucose, Bld 235 (H) 65 - 99 mg/dL   BUN 21 (H) 6 -  20 mg/dL   Creatinine, Ser 1.02 (H) 0.44 - 1.00 mg/dL   Calcium 9.0 8.9 - 10.3 mg/dL   GFR calc non Af Amer 58 (L) >60 mL/min   GFR calc Af Amer >60 >60 mL/min    Comment: (NOTE) The eGFR has been calculated using the CKD EPI equation. This calculation has not been validated in all clinical situations. eGFR's persistently <60 mL/min signify possible Chronic Kidney Disease.    Anion gap 7 5 - 15  Brain natriuretic peptide     Status: Abnormal   Collection Time: 07/11/15  5:24 PM  Result Value Ref Range   B Natriuretic Peptide 143.1 (H) 0.0 - 100.0 pg/mL  Troponin I     Status: None   Collection Time: 07/11/15  5:24 PM  Result Value Ref Range   Troponin I <0.03 <0.031 ng/mL    Comment:        NO INDICATION OF MYOCARDIAL INJURY.   CBC with Differential/Platelet     Status: Abnormal   Collection Time: 07/11/15  6:59 PM  Result Value Ref Range   WBC 6.0 4.0 - 10.5 K/uL   RBC 5.35 (H) 3.87 - 5.11 MIL/uL   Hemoglobin 14.0 12.0 - 15.0 g/dL   HCT 50.0 (H) 36.0 - 46.0 %   MCV 93.5 78.0 - 100.0  fL   MCH 26.2 26.0 - 34.0 pg   MCHC 28.0 (L) 30.0 - 36.0 g/dL   RDW 16.8 (H) 11.5 - 15.5 %   Platelets 65 (L) 150 - 400 K/uL    Comment: SPECIMEN CHECKED FOR CLOTS RESULT REPEATED AND VERIFIED PLATELET COUNT CONFIRMED BY SMEAR    Neutrophils Relative % 88 %   Lymphocytes Relative 10 %   Monocytes Relative 2 %   Eosinophils Relative 0 %   Basophils Relative 0 %   Neutro Abs 5.3 1.7 - 7.7 K/uL   Lymphs Abs 0.6 (L) 0.7 - 4.0 K/uL   Monocytes Absolute 0.1 0.1 - 1.0 K/uL   Eosinophils Absolute 0.0 0.0 - 0.7 K/uL   Basophils Absolute 0.0 0.0 - 0.1 K/uL   Smear Review MORPHOLOGY UNREMARKABLE   Creatinine, serum     Status: Abnormal   Collection Time: 07/11/15  7:43 PM  Result Value Ref Range   Creatinine, Ser 1.14 (H) 0.44 - 1.00 mg/dL   GFR calc non Af Amer 51 (L) >60 mL/min   GFR calc Af Amer 59 (L) >60 mL/min    Comment: (NOTE) The eGFR has been calculated using the CKD EPI  equation. This calculation has not been validated in all clinical situations. eGFR's persistently <60 mL/min signify possible Chronic Kidney Disease.   CBG monitoring, ED     Status: Abnormal   Collection Time: 07/11/15 10:03 PM  Result Value Ref Range   Glucose-Capillary 219 (H) 65 - 99 mg/dL   Comment 1 Notify RN   MRSA PCR Screening     Status: None   Collection Time: 07/11/15 11:30 PM  Result Value Ref Range   MRSA by PCR NEGATIVE NEGATIVE    Comment:        The GeneXpert MRSA Assay (FDA approved for NASAL specimens only), is one component of a comprehensive MRSA colonization surveillance program. It is not intended to diagnose MRSA infection nor to guide or monitor treatment for MRSA infections.   Influenza panel by PCR (type A & B, H1N1)     Status: None   Collection Time: 07/11/15 11:59 PM  Result Value Ref Range   Influenza A By PCR NEGATIVE NEGATIVE   Influenza B By PCR NEGATIVE NEGATIVE   H1N1 flu by pcr NOT DETECTED NOT DETECTED    Comment:        The Xpert Flu assay (FDA approved for nasal aspirates or washes and nasopharyngeal swab specimens), is intended as an aid in the diagnosis of influenza and should not be used as a sole basis for treatment. Performed at [facility] metabolic panel     Status: Abnormal   Collection Time: 07/12/15  3:39 AM  Result Value Ref Range   Sodium 141 135 - 145 mmol/L   Potassium 4.6 3.5 - 5.1 mmol/L   Chloride 92 (L) 101 - 111 mmol/L   CO2 40 (H) 22 - 32 mmol/L   Glucose, Bld 180 (H) 65 - 99 mg/dL   BUN 20 6 - 20 mg/dL   Creatinine, Ser 0.96 0.44 - 1.00 mg/dL   Calcium 9.0 8.9 - 10.3 mg/dL   GFR calc non Af Amer >60 >60 mL/min   GFR calc Af Amer >60 >60 mL/min    Comment: (NOTE) The eGFR has been calculated using the CKD EPI equation. This calculation has not been validated in all clinical situations. eGFR's persistently <60 mL/min signify possible Chronic Kidney Disease.    Anion gap  9 5 - 15  CBC      Status: Abnormal   Collection Time: 07/12/15  3:39 AM  Result Value Ref Range   WBC 5.4 4.0 - 10.5 K/uL   RBC 4.92 3.87 - 5.11 MIL/uL   Hemoglobin 12.9 12.0 - 15.0 g/dL   HCT 45.2 36.0 - 46.0 %   MCV 91.9 78.0 - 100.0 fL   MCH 26.2 26.0 - 34.0 pg   MCHC 28.5 (L) 30.0 - 36.0 g/dL   RDW 16.9 (H) 11.5 - 15.5 %   Platelets 62 (L) 150 - 400 K/uL    Comment: CONSISTENT WITH PREVIOUS RESULT  Glucose, capillary     Status: Abnormal   Collection Time: 07/12/15  8:01 AM  Result Value Ref Range   Glucose-Capillary 177 (H) 65 - 99 mg/dL  Glucose, capillary     Status: Abnormal   Collection Time: 07/12/15 11:39 AM  Result Value Ref Range   Glucose-Capillary 343 (H) 65 - 99 mg/dL  Glucose, capillary     Status: Abnormal   Collection Time: 07/12/15  3:46 PM  Result Value Ref Range   Glucose-Capillary 109 (H) 65 - 99 mg/dL  Glucose, capillary     Status: Abnormal   Collection Time: 07/12/15 10:10 PM  Result Value Ref Range   Glucose-Capillary 207 (H) 65 - 99 mg/dL   Comment 1 Notify RN    Comment 2 Document in Chart   CBC     Status: Abnormal   Collection Time: 07/13/15  3:26 AM  Result Value Ref Range   WBC 6.7 4.0 - 10.5 K/uL   RBC 4.83 3.87 - 5.11 MIL/uL   Hemoglobin 12.6 12.0 - 15.0 g/dL   HCT 44.0 36.0 - 46.0 %   MCV 91.1 78.0 - 100.0 fL   MCH 26.1 26.0 - 34.0 pg   MCHC 28.6 (L) 30.0 - 36.0 g/dL   RDW 16.7 (H) 11.5 - 15.5 %   Platelets 70 (L) 150 - 400 K/uL    Comment: CONSISTENT WITH PREVIOUS RESULT  Basic metabolic panel     Status: Abnormal   Collection Time: 07/13/15  3:26 AM  Result Value Ref Range   Sodium 137 135 - 145 mmol/L   Potassium 5.5 (H) 3.5 - 5.1 mmol/L    Comment: REPEATED TO VERIFY NO VISIBLE HEMOLYSIS    Chloride 90 (L) 101 - 111 mmol/L   CO2 39 (H) 22 - 32 mmol/L   Glucose, Bld 175 (H) 65 - 99 mg/dL   BUN 31 (H) 6 - 20 mg/dL   Creatinine, Ser 0.92 0.44 - 1.00 mg/dL   Calcium 9.0 8.9 - 10.3 mg/dL   GFR calc non Af Amer >60 >60 mL/min   GFR calc  Af Amer >60 >60 mL/min    Comment: (NOTE) The eGFR has been calculated using the CKD EPI equation. This calculation has not been validated in all clinical situations. eGFR's persistently <60 mL/min signify possible Chronic Kidney Disease.    Anion gap 8 5 - 15  Magnesium     Status: None   Collection Time: 07/13/15  3:26 AM  Result Value Ref Range   Magnesium 1.7 1.7 - 2.4 mg/dL  Lipid panel     Status: None   Collection Time: 07/13/15  3:26 AM  Result Value Ref Range   Cholesterol 154 0 - 200 mg/dL   Triglycerides 60 <150 mg/dL   HDL 62 >40 mg/dL   Total CHOL/HDL Ratio 2.5 RATIO   VLDL 12  0 - 40 mg/dL   LDL Cholesterol 80 0 - 99 mg/dL    Comment:        Total Cholesterol/HDL:CHD Risk Coronary Heart Disease Risk Table                     Men   Women  1/2 Average Risk   3.4   3.3  Average Risk       5.0   4.4  2 X Average Risk   9.6   7.1  3 X Average Risk  23.4   11.0        Use the calculated Patient Ratio above and the CHD Risk Table to determine the patient's CHD Risk.        ATP III CLASSIFICATION (LDL):  <100     mg/dL   Optimal  100-129  mg/dL   Near or Above                    Optimal  130-159  mg/dL   Borderline  160-189  mg/dL   High  >190     mg/dL   Very High Performed at [facility]   TSH     Status: None   Collection Time: 07/13/15  3:26 AM  Result Value Ref Range   TSH 0.642 0.350 - 4.500 uIU/mL  Hemoglobin A1c     Status: Abnormal   Collection Time: 07/13/15  3:26 AM  Result Value Ref Range   Hgb A1c MFr Bld 6.2 (H) 4.8 - 5.6 %    Comment: (NOTE)         Pre-diabetes: 5.7 - 6.4         Diabetes: >6.4         Glycemic control for adults with diabetes: <7.0    Mean Plasma Glucose 131 mg/dL    Comment: (NOTE) Performed At: [facility] [place], [place] [id number] Lindon Romp MD PP:[id number]   Potassium     Status: Abnormal   Collection Time: 07/13/15  8:15 AM  Result Value Ref Range   Potassium 5.2 (H)  3.5 - 5.1 mmol/L    Comment: NO VISIBLE HEMOLYSIS  Glucose, capillary     Status: Abnormal   Collection Time: 07/13/15  8:21 AM  Result Value Ref Range   Glucose-Capillary 151 (H) 65 - 99 mg/dL  Glucose, capillary     Status: Abnormal   Collection Time: 07/13/15 11:51 AM  Result Value Ref Range   Glucose-Capillary 201 (H) 65 - 99 mg/dL  Glucose, capillary     Status: Abnormal   Collection Time: 07/13/15  3:24 PM  Result Value Ref Range   Glucose-Capillary 167 (H) 65 - 99 mg/dL  Respiratory virus panel     Status: None   Collection Time: 07/13/15  8:45 PM  Result Value Ref Range   Respiratory Syncytial Virus A Negative Negative   Respiratory Syncytial Virus B Negative Negative   Influenza A Negative Negative   Influenza B Negative Negative   Parainfluenza 1 Negative Negative   Parainfluenza 2 Negative Negative   Parainfluenza 3 Negative Negative   Metapneumovirus Negative Negative   Rhinovirus Negative Negative   Adenovirus Negative Negative    Comment: (NOTE) Performed At: [facility] [address] [place], [place] [id number] Lindon Romp MD SN:[id number]   Glucose, capillary     Status: Abnormal   Collection Time: 07/13/15 10:22 PM  Result Value Ref Range   Glucose-Capillary  162 (H) 65 - 99 mg/dL  CBC     Status: Abnormal   Collection Time: 07/14/15  3:50 AM  Result Value Ref Range   WBC 7.1 4.0 - 10.5 K/uL   RBC 4.95 3.87 - 5.11 MIL/uL   Hemoglobin 13.1 12.0 - 15.0 g/dL   HCT 43.7 36.0 - 46.0 %   MCV 88.3 78.0 - 100.0 fL   MCH 26.5 26.0 - 34.0 pg   MCHC 30.0 30.0 - 36.0 g/dL   RDW 16.6 (H) 11.5 - 15.5 %   Platelets 65 (L) 150 - 400 K/uL    Comment: CONSISTENT WITH PREVIOUS RESULT  Basic metabolic panel     Status: Abnormal   Collection Time: 07/14/15  3:50 AM  Result Value Ref Range   Sodium 137 135 - 145 mmol/L   Potassium 5.1 3.5 - 5.1 mmol/L   Chloride 90 (L) 101 - 111 mmol/L   CO2 38 (H) 22 - 32 mmol/L   Glucose, Bld 185 (H) 65 - 99 mg/dL    BUN 33 (H) 6 - 20 mg/dL   Creatinine, Ser 0.95 0.44 - 1.00 mg/dL   Calcium 9.2 8.9 - 10.3 mg/dL   GFR calc non Af Amer >60 >60 mL/min   GFR calc Af Amer >60 >60 mL/min    Comment: (NOTE) The eGFR has been calculated using the CKD EPI equation. This calculation has not been validated in all clinical situations. eGFR's persistently <60 mL/min signify possible Chronic Kidney Disease.    Anion gap 9 5 - 15  Magnesium     Status: None   Collection Time: 07/14/15  3:50 AM  Result Value Ref Range   Magnesium 1.7 1.7 - 2.4 mg/dL  Glucose, capillary     Status: Abnormal   Collection Time: 07/14/15  7:57 AM  Result Value Ref Range   Glucose-Capillary 126 (H) 65 - 99 mg/dL  Glucose, capillary     Status: Abnormal   Collection Time: 07/14/15 11:52 AM  Result Value Ref Range   Glucose-Capillary 165 (H) 65 - 99 mg/dL  Glucose, capillary     Status: Abnormal   Collection Time: 07/14/15  4:09 PM  Result Value Ref Range   Glucose-Capillary 178 (H) 65 - 99 mg/dL   Comment 1 Notify RN    Comment 2 Document in Chart   Glucose, capillary     Status: Abnormal   Collection Time: 07/14/15  4:51 PM  Result Value Ref Range   Glucose-Capillary 177 (H) 65 - 99 mg/dL  Glucose, capillary     Status: Abnormal   Collection Time: 07/14/15  8:28 PM  Result Value Ref Range   Glucose-Capillary 221 (H) 65 - 99 mg/dL  Blood gas, arterial     Status: Abnormal   Collection Time: 07/15/15  4:25 AM  Result Value Ref Range   O2 Content 4.0 L/min   Delivery systems NASAL CANNULA    pH, Arterial 7.397 7.350 - 7.450   pCO2 arterial 68.0 (HH) 35.0 - 45.0 mmHg    Comment: CRITICAL RESULT CALLED TO, READ BACK BY AND VERIFIED WITH: DR DE DIOS AT 0440 BY ANNALISSA AGUSTIN,RRT,RCP ON 07/15/15    pO2, Arterial 96.2 80.0 - 100.0 mmHg   Bicarbonate 41.2 (H) 20.0 - 24.0 mEq/L   TCO2 36.8 0 - 100 mmol/L   Acid-Base Excess 13.3 (H) 0.0 - 2.0 mmol/L   O2 Saturation 97.0 %   Patient temperature 97.7    Collection site LEFT  RADIAL  Drawn by [id number]    Sample type ARTERIAL    Allens test (pass/fail) PASS PASS  Magnesium     Status: None   Collection Time: 07/15/15  4:48 AM  Result Value Ref Range   Magnesium 2.2 1.7 - 2.4 mg/dL  Phosphorus     Status: Abnormal   Collection Time: 07/15/15  4:48 AM  Result Value Ref Range   Phosphorus 4.9 (H) 2.5 - 4.6 mg/dL  Troponin I     Status: None   Collection Time: 07/15/15  4:48 AM  Result Value Ref Range   Troponin I <0.03 <0.031 ng/mL    Comment:        NO INDICATION OF MYOCARDIAL INJURY.   CBC with Differential/Platelet     Status: Abnormal   Collection Time: 07/15/15  4:48 AM  Result Value Ref Range   WBC 5.7 4.0 - 10.5 K/uL   RBC 4.85 3.87 - 5.11 MIL/uL   Hemoglobin 13.1 12.0 - 15.0 g/dL   HCT 42.1 36.0 - 46.0 %   MCV 86.8 78.0 - 100.0 fL   MCH 27.0 26.0 - 34.0 pg   MCHC 31.1 30.0 - 36.0 g/dL   RDW 16.6 (H) 11.5 - 15.5 %   Platelets 65 (L) 150 - 400 K/uL    Comment: CONSISTENT WITH PREVIOUS RESULT   Neutrophils Relative % 90 %   Neutro Abs 5.2 1.7 - 7.7 K/uL   Lymphocytes Relative 6 %   Lymphs Abs 0.3 (L) 0.7 - 4.0 K/uL   Monocytes Relative 4 %   Monocytes Absolute 0.3 0.1 - 1.0 K/uL   Eosinophils Relative 0 %   Eosinophils Absolute 0.0 0.0 - 0.7 K/uL   Basophils Relative 0 %   Basophils Absolute 0.0 0.0 - 0.1 K/uL  Basic metabolic panel     Status: Abnormal   Collection Time: 07/15/15  4:48 AM  Result Value Ref Range   Sodium 137 135 - 145 mmol/L   Potassium 4.4 3.5 - 5.1 mmol/L   Chloride 89 (L) 101 - 111 mmol/L   CO2 40 (H) 22 - 32 mmol/L   Glucose, Bld 166 (H) 65 - 99 mg/dL   BUN 34 (H) 6 - 20 mg/dL   Creatinine, Ser 0.97 0.44 - 1.00 mg/dL   Calcium 9.1 8.9 - 10.3 mg/dL   GFR calc non Af Amer >60 >60 mL/min   GFR calc Af Amer >60 >60 mL/min    Comment: (NOTE) The eGFR has been calculated using the CKD EPI equation. This calculation has not been validated in all clinical situations. eGFR's persistently <60 mL/min signify possible  Chronic Kidney Disease.    Anion gap 8 5 - 15  Glucose, capillary     Status: Abnormal   Collection Time: 07/15/15  7:47 AM  Result Value Ref Range   Glucose-Capillary 135 (H) 65 - 99 mg/dL   Comment 1 Notify RN   Glucose, capillary     Status: Abnormal   Collection Time: 07/15/15 12:07 PM  Result Value Ref Range   Glucose-Capillary 178 (H) 65 - 99 mg/dL   Comment 1 Notify RN   Glucose, capillary     Status: Abnormal   Collection Time: 07/15/15  4:49 PM  Result Value Ref Range   Glucose-Capillary 171 (H) 65 - 99 mg/dL   Comment 1 Notify RN   Glucose, capillary     Status: None   Collection Time: 07/15/15  8:43 PM  Result Value Ref Range   Glucose-Capillary 97 65 -  99 mg/dL  Magnesium     Status: None   Collection Time: 07/16/15  4:58 AM  Result Value Ref Range   Magnesium 1.8 1.7 - 2.4 mg/dL  Phosphorus     Status: Abnormal   Collection Time: 07/16/15  4:58 AM  Result Value Ref Range   Phosphorus 5.2 (H) 2.5 - 4.6 mg/dL  CBC with Differential/Platelet     Status: Abnormal   Collection Time: 07/16/15  4:58 AM  Result Value Ref Range   WBC 5.6 4.0 - 10.5 K/uL   RBC 5.03 3.87 - 5.11 MIL/uL   Hemoglobin 13.2 12.0 - 15.0 g/dL   HCT 44.0 36.0 - 46.0 %   MCV 87.5 78.0 - 100.0 fL   MCH 26.2 26.0 - 34.0 pg   MCHC 30.0 30.0 - 36.0 g/dL   RDW 16.8 (H) 11.5 - 15.5 %   Platelets 56 (L) 150 - 400 K/uL    Comment: CONSISTENT WITH PREVIOUS RESULT   Neutrophils Relative % 73 %   Neutro Abs 4.1 1.7 - 7.7 K/uL   Lymphocytes Relative 17 %   Lymphs Abs 1.0 0.7 - 4.0 K/uL   Monocytes Relative 9 %   Monocytes Absolute 0.5 0.1 - 1.0 K/uL   Eosinophils Relative 0 %   Eosinophils Absolute 0.0 0.0 - 0.7 K/uL   Basophils Relative 0 %   Basophils Absolute 0.0 0.0 - 0.1 K/uL  Basic metabolic panel     Status: Abnormal   Collection Time: 07/16/15  4:58 AM  Result Value Ref Range   Sodium 139 135 - 145 mmol/L   Potassium 4.3 3.5 - 5.1 mmol/L   Chloride 88 (L) 101 - 111 mmol/L   CO2 44 (H)  22 - 32 mmol/L   Glucose, Bld 77 65 - 99 mg/dL   BUN 37 (H) 6 - 20 mg/dL   Creatinine, Ser 1.29 (H) 0.44 - 1.00 mg/dL   Calcium 9.1 8.9 - 10.3 mg/dL   GFR calc non Af Amer 44 (L) >60 mL/min   GFR calc Af Amer 51 (L) >60 mL/min    Comment: (NOTE) The eGFR has been calculated using the CKD EPI equation. This calculation has not been validated in all clinical situations. eGFR's persistently <60 mL/min signify possible Chronic Kidney Disease.    Anion gap 7 5 - 15  Glucose, capillary     Status: None   Collection Time: 07/16/15  7:38 AM  Result Value Ref Range   Glucose-Capillary 72 65 - 99 mg/dL   Comment 1 Notify RN   Glucose, capillary     Status: None   Collection Time: 07/16/15 12:14 PM  Result Value Ref Range   Glucose-Capillary 86 65 - 99 mg/dL   Comment 1 Notify RN   Glucose, capillary     Status: Abnormal   Collection Time: 07/16/15  4:32 PM  Result Value Ref Range   Glucose-Capillary 116 (H) 65 - 99 mg/dL   Comment 1 Notify RN   Glucose, capillary     Status: Abnormal   Collection Time: 07/16/15  8:47 PM  Result Value Ref Range   Glucose-Capillary 110 (H) 65 - 99 mg/dL  Magnesium     Status: None   Collection Time: 07/17/15  5:20 AM  Result Value Ref Range   Magnesium 1.9 1.7 - 2.4 mg/dL  Phosphorus     Status: Abnormal   Collection Time: 07/17/15  5:20 AM  Result Value Ref Range   Phosphorus 5.1 (H) 2.5 - 4.6  mg/dL  CBC with Differential/Platelet     Status: Abnormal   Collection Time: 07/17/15  5:20 AM  Result Value Ref Range   WBC 6.9 4.0 - 10.5 K/uL   RBC 4.88 3.87 - 5.11 MIL/uL   Hemoglobin 13.1 12.0 - 15.0 g/dL   HCT 43.2 36.0 - 46.0 %   MCV 88.5 78.0 - 100.0 fL   MCH 26.8 26.0 - 34.0 pg   MCHC 30.3 30.0 - 36.0 g/dL   RDW 17.1 (H) 11.5 - 15.5 %   Platelets 61 (L) 150 - 400 K/uL    Comment: CONSISTENT WITH PREVIOUS RESULT   Neutrophils Relative % 75 %   Neutro Abs 5.2 1.7 - 7.7 K/uL   Lymphocytes Relative 19 %   Lymphs Abs 1.3 0.7 - 4.0 K/uL    Monocytes Relative 5 %   Monocytes Absolute 0.3 0.1 - 1.0 K/uL   Eosinophils Relative 1 %   Eosinophils Absolute 0.0 0.0 - 0.7 K/uL   Basophils Relative 0 %   Basophils Absolute 0.0 0.0 - 0.1 K/uL  Basic metabolic panel     Status: Abnormal   Collection Time: 07/17/15  5:20 AM  Result Value Ref Range   Sodium 140 135 - 145 mmol/L   Potassium 4.1 3.5 - 5.1 mmol/L   Chloride 92 (L) 101 - 111 mmol/L   CO2 40 (H) 22 - 32 mmol/L   Glucose, Bld 98 65 - 99 mg/dL   BUN 40 (H) 6 - 20 mg/dL   Creatinine, Ser 1.14 (H) 0.44 - 1.00 mg/dL   Calcium 8.7 (L) 8.9 - 10.3 mg/dL   GFR calc non Af Amer 51 (L) >60 mL/min   GFR calc Af Amer 59 (L) >60 mL/min    Comment: (NOTE) The eGFR has been calculated using the CKD EPI equation. This calculation has not been validated in all clinical situations. eGFR's persistently <60 mL/min signify possible Chronic Kidney Disease.    Anion gap 8 5 - 15  Glucose, capillary     Status: None   Collection Time: 07/17/15  7:34 AM  Result Value Ref Range   Glucose-Capillary 83 65 - 99 mg/dL   Comment 1 Notify RN    Comment 2 Document in Chart   Basic Metabolic Panel (BMET)     Status: Abnormal   Collection Time: 07/23/15  3:11 PM  Result Value Ref Range   Sodium 140 135 - 145 mEq/L   Potassium 4.3 3.5 - 5.1 mEq/L   Chloride 98 96 - 112 mEq/L   CO2 36 (H) 19 - 32 mEq/L   Glucose, Bld 124 (H) 70 - 99 mg/dL   BUN 18 6 - 23 mg/dL   Creatinine, Ser 1.05 0.40 - 1.20 mg/dL   Calcium 9.6 8.4 - 10.5 mg/dL   GFR 56.44 (L) >60.00 mL/min    Assessment/Plan: COPD exacerbation (HCC) Resolved. Vitals look good today. Patient is happy with how she is feeling. Will continue current regimen. She is to follow-up with Pulmonology as scheduled. Will check repeat BMP today.

## 2015-07-23 NOTE — Patient Instructions (Signed)
Please go to the lab for blood work. I will call with results.  Continue your current medication regimen and oxygen at 2L/min. Make sure to schedule follow-up with Dr. Melvyn Novas.  Follow-up with me in 1 month.

## 2015-07-23 NOTE — Progress Notes (Signed)
Pre visit review using our clinic review tool, if applicable. No additional management support is needed unless otherwise documented below in the visit note. 

## 2015-07-24 LAB — BASIC METABOLIC PANEL
BUN: 18 mg/dL (ref 6–23)
CALCIUM: 9.6 mg/dL (ref 8.4–10.5)
CHLORIDE: 98 meq/L (ref 96–112)
CO2: 36 meq/L — AB (ref 19–32)
CREATININE: 1.05 mg/dL (ref 0.40–1.20)
GFR: 56.44 mL/min — ABNORMAL LOW (ref >60.00)
GLUCOSE: 124 mg/dL — AB (ref 70–99)
Potassium: 4.3 mEq/L (ref 3.5–5.1)
Sodium: 140 mEq/L (ref 135–145)

## 2015-07-27 NOTE — Assessment & Plan Note (Signed)
Resolved. Vitals look good today. Patient is happy with how she is feeling. Will continue current regimen. She is to follow-up with Pulmonology as scheduled. Will check repeat BMP today.

## 2015-07-28 ENCOUNTER — Encounter: Payer: Self-pay | Admitting: Pulmonary Disease

## 2015-07-28 ENCOUNTER — Ambulatory Visit (INDEPENDENT_AMBULATORY_CARE_PROVIDER_SITE_OTHER): Payer: Medicare Other | Admitting: Pulmonary Disease

## 2015-07-28 VITALS — BP 112/68 | HR 109 | Ht 64.0 in | Wt 302.0 lb

## 2015-07-28 DIAGNOSIS — J9611 Chronic respiratory failure with hypoxia: Secondary | ICD-10-CM

## 2015-07-28 DIAGNOSIS — J449 Chronic obstructive pulmonary disease, unspecified: Secondary | ICD-10-CM | POA: Diagnosis not present

## 2015-07-28 DIAGNOSIS — Z9981 Dependence on supplemental oxygen: Secondary | ICD-10-CM | POA: Diagnosis not present

## 2015-07-28 NOTE — Patient Instructions (Signed)
1.  Continue your medications as prescribed 2.  Congratulations on quitting smoking!  Stick with it!  Use the help line if needed (card provided) 3.  Follow up with Dr. Melvyn Novas in 3 months for ambulatory assessment to determine if you can come off oxygen 4.  No smoking near the oxygen tanks / concentrator for your safety 5.  Follow up with your primary provider as needed

## 2015-07-28 NOTE — Progress Notes (Signed)
Chart and office note reviewed in detail  > agree with a/p as outlined    

## 2015-07-28 NOTE — Progress Notes (Signed)
Current Outpatient Prescriptions on File Prior to Visit  Medication Sig  . albuterol (PROVENTIL HFA;VENTOLIN HFA) 108 (90 Base) MCG/ACT inhaler Inhale 1-2 puffs into the lungs every 6 (six) hours as needed for wheezing or shortness of breath.  . baclofen (LIORESAL) 10 MG tablet Take 10 mg by mouth 2 (two) times daily as needed for muscle spasms.   . clotrimazole-betamethasone (LOTRISONE) cream Apply 1 application topically 2 (two) times daily.  . Fluticasone-Salmeterol (ADVAIR DISKUS) 500-50 MCG/DOSE AEPB Inhale 1 puff into the lungs once.  . furosemide (LASIX) 20 MG tablet Take 1 tablet (20 mg total) by mouth daily as needed. (Patient taking differently: Take 20 mg by mouth daily. )  . insulin glargine (LANTUS) 100 UNIT/ML injection Inject 0.3 mLs (30 Units total) into the skin daily.  . insulin lispro (HUMALOG) 100 UNIT/ML injection Inject 0.15 mLs (15 Units total) into the skin 3 (three) times daily before meals. (Patient taking differently: Inject 15 Units into the skin 3 (three) times daily as needed for high blood sugar. )  . Insulin Syringe-Needle U-100 30G X 1/2" 1 ML MISC Use 4 times daily as directed.  [name] Kitchen levothyroxine (SYNTHROID, LEVOTHROID) 75 MCG tablet Take 1 tablet (75 mcg total) by mouth daily before breakfast.  . nortriptyline (PAMELOR) 75 MG capsule Take 1 capsule (75 mg total) by mouth at bedtime.  [name] Kitchen nystatin (MYCOSTATIN/NYSTOP) 100000 UNIT/GM POWD Apply 1 Bottle topically 2 (two) times daily as needed (yeast infection).   . Oxycodone HCl 10 MG TABS Take 1 tablet (10 mg total) by mouth every 8 (eight) hours as needed.  . pravastatin (PRAVACHOL) 20 MG tablet Take 1 tablet (20 mg total) by mouth daily.  . sitaGLIPtin (JANUVIA) 50 MG tablet Take 1 tablet (50 mg total) by mouth daily.  [name] Kitchen tiotropium (SPIRIVA) 18 MCG inhalation capsule Place 1 capsule (18 mcg total) into inhaler and inhale daily.   No current facility-administered medications on file prior to visit.     Chief  Complaint  Patient presents with  . Hospitalization Follow-up    Breathing has improved since leaving the hospital. Reports increased coughing with production of white mucus. Oxygen has helped her pulmonary issues.     Tests 02/2014  PFT's >> reduced diffusing capacity, severe airway obstruction and overinflation are characteristic of emphysema.  Past medical hx  has a past medical history of Diabetes ([place]); Neuropathy ([place]); COPD (chronic obstructive pulmonary disease) ([place]); Renal cancer ([place]); Fibromyalgia; Hepatitis C; Abdominal mass of other site; Chronic kidney disease, stage 3; Depression; Cervical compression fracture ([place]); Hypercholesteremia; Syncope; Osteoarthritis; Hypertension; Hypothyroidism; Chronic lower limb pain; Morbid obesity ([place]); CTS (carpal tunnel syndrome); RLS (restless legs syndrome); Venous stasis; and OSA (obstructive sleep apnea).   Past surgical hx, Allergies, Family hx, Social hx all reviewed.  Vital Signs BP 112/68 mmHg  Pulse 109  Ht 5\' 4"  (1.626 m)  Wt 302 lb (136.986 kg)  BMI 51.81 kg/m2  SpO2 95%  History of Present Illness Renee Noble is a 60 y.o. female, former smoker (quit May 2015) with a PMH as above, including former O2 dependence and recent admission to [facility] from 2/24 -3/2 for an acute COPD exacerbation (flu, RVP negative) who presented to pulmonary office for follow up. During admission, she was assessed for O2 needs and discharge on 2L continuous.    The patient previously was on O2 but was able to come off in 12/2013.  She was previously followed by Dr. Melvyn Novas and released back to primary (due to  cost for patient, limited income and stable on pulmonary regimen at that time).  Hospital course included CTA chest negative for PE, treated with IV steroids, nebulized bronchodilators and lasix (dose adjusted to daily while inpatient, follow up labs 3/8 reviewed / normal).   Since discharge, she reports feeling better overall.  Denies fevers,  chills, SOB, pain with inspiration. She does report cough with white sputum production.  Feels as though sputum is finally breaking loose.   Reports feeling better on 2L O2,but disappointed she had to go back on it since she was taken off previously.      Physical Exam  General - obese female in no acute distress ENT - No sinus tenderness, no oral exudate, no LAN Cardiac - s1s2 regular, no murmur Chest - even/non-labored, soft wheeze on right, left diminished.  Back - No focal tenderness Abd - Soft, non-tender Ext - BLE with changes consistent with chronic venous stasis, 1+ edema Neuro - Normal strength Skin - No rashes Psych - normal mood, and behavior   Assessment/Plan  Acute on Chronic Hypoxic Respiratory Failure - in setting of COPD exacerbation with recent hospitalization  Plan: Continue O2 as prescribed Bring back in 3 months for ambulatory assessment and possibility of coming off O2   COPD - severe airway obstruction on 02/2014 PFT's  Plan: Continue Spiriva, Advair as maintenance regimen PRN albuterol HFA for SOB / wheezing  BID duonebs per patient, prescribed by Dr. Chase Caller while inpatient    Smoking Cessation - pt quit in 09/2013!  She lives with a smoker but they do not smoke in the house  Plan: Encouraged ongoing smoking cessation    Patient Instructions  1.  Continue your medications as prescribed 2.  Congratulations on quitting smoking!  Stick with it!  Use the help line if needed (card provided) 3.  Follow up with Dr. Melvyn Novas in 3 months for ambulatory assessment to determine if you can come off oxygen 4.  No smoking near the oxygen tanks / concentrator for your safety 5.  Follow up with your primary provider as needed    Noe Gens, NP-C [place]  [phone] 07/28/2015, 10:40 AM   CC: Dr. Melvyn Novas

## 2015-07-30 DIAGNOSIS — M47817 Spondylosis without myelopathy or radiculopathy, lumbosacral region: Secondary | ICD-10-CM | POA: Diagnosis not present

## 2015-07-30 DIAGNOSIS — Z79899 Other long term (current) drug therapy: Secondary | ICD-10-CM | POA: Diagnosis not present

## 2015-07-30 DIAGNOSIS — G894 Chronic pain syndrome: Secondary | ICD-10-CM | POA: Diagnosis not present

## 2015-07-30 DIAGNOSIS — M5137 Other intervertebral disc degeneration, lumbosacral region: Secondary | ICD-10-CM | POA: Diagnosis not present

## 2015-08-01 DIAGNOSIS — J449 Chronic obstructive pulmonary disease, unspecified: Secondary | ICD-10-CM | POA: Diagnosis not present

## 2015-08-06 ENCOUNTER — Telehealth: Payer: Self-pay | Admitting: *Deleted

## 2015-08-06 NOTE — Telephone Encounter (Signed)
Received request for update on CMN from 07/04/15; forwarded to provider/SLS 03/22

## 2015-08-08 ENCOUNTER — Telehealth: Payer: Self-pay | Admitting: Physician Assistant

## 2015-08-08 DIAGNOSIS — E119 Type 2 diabetes mellitus without complications: Secondary | ICD-10-CM

## 2015-08-08 DIAGNOSIS — Z794 Long term (current) use of insulin: Principal | ICD-10-CM

## 2015-08-08 MED ORDER — INSULIN LISPRO 100 UNIT/ML ~~LOC~~ SOLN: 15.0000 [IU] | Freq: Three times a day (TID) | SUBCUTANEOUS | Status: DC | PRN

## 2015-08-08 MED ORDER — INSULIN GLARGINE 100 UNIT/ML ~~LOC~~ SOLN: 30.0000 [IU] | Freq: Every day | SUBCUTANEOUS | Status: DC

## 2015-08-08 NOTE — Telephone Encounter (Signed)
Rx has been sent  

## 2015-08-08 NOTE — Telephone Encounter (Signed)
Notified pt. 

## 2015-08-08 NOTE — Telephone Encounter (Signed)
Pharmacy: [facility], [place] - [address]  Reason for call: pt called for refill on Lantus and Humalog. She said she will be out of both tomorrow and her pharmacy told her they contacted [place] a couple days ago.

## 2015-08-11 NOTE — Telephone Encounter (Signed)
Received new blank CMN for oxygen from [facility], stating that previous update faxed in did not have date beside of initialed areas, completed form and forwarded to provider for signature/SLS 03/27

## 2015-08-13 DIAGNOSIS — M47817 Spondylosis without myelopathy or radiculopathy, lumbosacral region: Secondary | ICD-10-CM | POA: Diagnosis not present

## 2015-08-27 DIAGNOSIS — M5137 Other intervertebral disc degeneration, lumbosacral region: Secondary | ICD-10-CM | POA: Diagnosis not present

## 2015-08-27 DIAGNOSIS — Z79891 Long term (current) use of opiate analgesic: Secondary | ICD-10-CM | POA: Diagnosis not present

## 2015-08-27 DIAGNOSIS — Z79899 Other long term (current) drug therapy: Secondary | ICD-10-CM | POA: Diagnosis not present

## 2015-08-27 DIAGNOSIS — M542 Cervicalgia: Secondary | ICD-10-CM | POA: Diagnosis not present

## 2015-08-27 DIAGNOSIS — G894 Chronic pain syndrome: Secondary | ICD-10-CM | POA: Diagnosis not present

## 2015-08-27 DIAGNOSIS — M47817 Spondylosis without myelopathy or radiculopathy, lumbosacral region: Secondary | ICD-10-CM | POA: Diagnosis not present

## 2015-09-01 DIAGNOSIS — J449 Chronic obstructive pulmonary disease, unspecified: Secondary | ICD-10-CM | POA: Diagnosis not present

## 2015-09-11 DIAGNOSIS — M5412 Radiculopathy, cervical region: Secondary | ICD-10-CM | POA: Diagnosis not present

## 2015-09-11 DIAGNOSIS — R200 Anesthesia of skin: Secondary | ICD-10-CM | POA: Diagnosis not present

## 2015-09-24 ENCOUNTER — Other Ambulatory Visit: Payer: Self-pay | Admitting: *Deleted

## 2015-09-24 DIAGNOSIS — M47817 Spondylosis without myelopathy or radiculopathy, lumbosacral region: Secondary | ICD-10-CM | POA: Diagnosis not present

## 2015-09-24 DIAGNOSIS — G894 Chronic pain syndrome: Secondary | ICD-10-CM | POA: Diagnosis not present

## 2015-09-24 DIAGNOSIS — M5412 Radiculopathy, cervical region: Secondary | ICD-10-CM | POA: Diagnosis not present

## 2015-09-24 DIAGNOSIS — E119 Type 2 diabetes mellitus without complications: Secondary | ICD-10-CM

## 2015-09-24 DIAGNOSIS — M5137 Other intervertebral disc degeneration, lumbosacral region: Secondary | ICD-10-CM | POA: Diagnosis not present

## 2015-09-24 DIAGNOSIS — Z79891 Long term (current) use of opiate analgesic: Secondary | ICD-10-CM | POA: Diagnosis not present

## 2015-09-24 DIAGNOSIS — Z79899 Other long term (current) drug therapy: Secondary | ICD-10-CM | POA: Diagnosis not present

## 2015-09-24 DIAGNOSIS — Z794 Long term (current) use of insulin: Principal | ICD-10-CM

## 2015-09-24 MED ORDER — SITAGLIPTIN PHOSPHATE 50 MG PO TABS: 50.0000 mg | ORAL_TABLET | Freq: Every day | ORAL | Status: DC

## 2015-09-24 NOTE — Telephone Encounter (Signed)
Rx sent to the pharmacy by e-script.  Pt will need to schdedule an office visit.//AB/CMA

## 2015-10-01 DIAGNOSIS — J449 Chronic obstructive pulmonary disease, unspecified: Secondary | ICD-10-CM | POA: Diagnosis not present

## 2015-10-22 ENCOUNTER — Ambulatory Visit (HOSPITAL_BASED_OUTPATIENT_CLINIC_OR_DEPARTMENT_OTHER)
Admission: RE | Admit: 2015-10-22 | Discharge: 2015-10-22 | Disposition: A | Discharge status: Home / Self Care | Payer: Medicare Other | Source: Ambulatory Visit | Attending: Anesthesiology | Admitting: Anesthesiology

## 2015-10-22 ENCOUNTER — Other Ambulatory Visit: Payer: Self-pay | Admitting: Physician Assistant

## 2015-10-22 ENCOUNTER — Other Ambulatory Visit (HOSPITAL_BASED_OUTPATIENT_CLINIC_OR_DEPARTMENT_OTHER): Payer: Self-pay | Admitting: Anesthesiology

## 2015-10-22 DIAGNOSIS — M542 Cervicalgia: Secondary | ICD-10-CM | POA: Diagnosis not present

## 2015-10-22 DIAGNOSIS — M5031 Other cervical disc degeneration,  high cervical region: Secondary | ICD-10-CM | POA: Diagnosis not present

## 2015-10-22 DIAGNOSIS — E119 Type 2 diabetes mellitus without complications: Secondary | ICD-10-CM

## 2015-10-22 DIAGNOSIS — M50321 Other cervical disc degeneration at C4-C5 level: Secondary | ICD-10-CM | POA: Diagnosis not present

## 2015-10-22 DIAGNOSIS — M50322 Other cervical disc degeneration at C5-C6 level: Secondary | ICD-10-CM | POA: Diagnosis not present

## 2015-10-22 DIAGNOSIS — M50323 Other cervical disc degeneration at C6-C7 level: Secondary | ICD-10-CM | POA: Diagnosis not present

## 2015-10-22 DIAGNOSIS — Z794 Long term (current) use of insulin: Principal | ICD-10-CM

## 2015-10-22 MED ORDER — SITAGLIPTIN PHOSPHATE 50 MG PO TABS: 50.0000 mg | ORAL_TABLET | Freq: Every day | ORAL | Status: DC

## 2015-10-23 DIAGNOSIS — G894 Chronic pain syndrome: Secondary | ICD-10-CM | POA: Diagnosis not present

## 2015-10-23 DIAGNOSIS — M5412 Radiculopathy, cervical region: Secondary | ICD-10-CM | POA: Diagnosis not present

## 2015-10-23 DIAGNOSIS — Z79891 Long term (current) use of opiate analgesic: Secondary | ICD-10-CM | POA: Diagnosis not present

## 2015-10-23 DIAGNOSIS — M542 Cervicalgia: Secondary | ICD-10-CM | POA: Diagnosis not present

## 2015-10-23 DIAGNOSIS — M79642 Pain in left hand: Secondary | ICD-10-CM | POA: Diagnosis not present

## 2015-10-23 DIAGNOSIS — Z79899 Other long term (current) drug therapy: Secondary | ICD-10-CM | POA: Diagnosis not present

## 2015-10-23 DIAGNOSIS — M79602 Pain in left arm: Secondary | ICD-10-CM | POA: Diagnosis not present

## 2015-10-24 ENCOUNTER — Other Ambulatory Visit: Payer: Self-pay | Admitting: Pain Medicine

## 2015-10-24 DIAGNOSIS — M79602 Pain in left arm: Secondary | ICD-10-CM

## 2015-10-24 DIAGNOSIS — M542 Cervicalgia: Secondary | ICD-10-CM

## 2015-10-28 ENCOUNTER — Ambulatory Visit: Payer: Medicare Other | Admitting: Internal Medicine

## 2015-11-01 DIAGNOSIS — J449 Chronic obstructive pulmonary disease, unspecified: Secondary | ICD-10-CM | POA: Diagnosis not present

## 2015-11-04 ENCOUNTER — Ambulatory Visit
Admission: RE | Admit: 2015-11-04 | Discharge: 2015-11-04 | Disposition: A | Discharge status: Home / Self Care | Payer: Medicare Other | Source: Ambulatory Visit | Attending: Pain Medicine | Admitting: Pain Medicine

## 2015-11-04 DIAGNOSIS — M4802 Spinal stenosis, cervical region: Secondary | ICD-10-CM | POA: Diagnosis not present

## 2015-11-04 DIAGNOSIS — M542 Cervicalgia: Secondary | ICD-10-CM

## 2015-11-04 DIAGNOSIS — M79602 Pain in left arm: Secondary | ICD-10-CM

## 2015-11-05 ENCOUNTER — Telehealth: Payer: Self-pay | Admitting: *Deleted

## 2015-11-05 DIAGNOSIS — E785 Hyperlipidemia, unspecified: Secondary | ICD-10-CM

## 2015-11-05 MED ORDER — PRAVASTATIN SODIUM 20 MG PO TABS: 20.0000 mg | ORAL_TABLET | Freq: Every day | ORAL | Status: DC

## 2015-11-05 NOTE — Telephone Encounter (Signed)
Rx request to pharmacy/SLS  

## 2015-11-10 ENCOUNTER — Telehealth: Payer: Self-pay | Admitting: Physician Assistant

## 2015-11-10 DIAGNOSIS — E119 Type 2 diabetes mellitus without complications: Secondary | ICD-10-CM

## 2015-11-10 DIAGNOSIS — Z794 Long term (current) use of insulin: Principal | ICD-10-CM

## 2015-11-10 DIAGNOSIS — E785 Hyperlipidemia, unspecified: Secondary | ICD-10-CM

## 2015-11-10 MED ORDER — INSULIN GLARGINE 100 UNIT/ML ~~LOC~~ SOLN: 30.0000 [IU] | Freq: Every day | SUBCUTANEOUS | Status: DC

## 2015-11-10 MED ORDER — PRAVASTATIN SODIUM 20 MG PO TABS: 20.0000 mg | ORAL_TABLET | Freq: Every day | ORAL | Status: DC

## 2015-11-10 NOTE — Telephone Encounter (Signed)
Relation to [id number] Call back number:[phone] Pharmacy: [facility], [place] - [address]  Reason for call:  Patient requesting a refill insulin glargine (LANTUS) 100 UNIT/ML injection and states pravastatin (PRAVACHOL) 20 MG tablet was never received by pharmacy. Please advise

## 2015-11-10 NOTE — Telephone Encounter (Signed)
Rx request to pharmacy/SLS  

## 2015-11-12 ENCOUNTER — Ambulatory Visit: Payer: Medicare Other | Admitting: Internal Medicine

## 2015-11-20 DIAGNOSIS — M79602 Pain in left arm: Secondary | ICD-10-CM | POA: Diagnosis not present

## 2015-11-20 DIAGNOSIS — M5412 Radiculopathy, cervical region: Secondary | ICD-10-CM | POA: Diagnosis not present

## 2015-11-20 DIAGNOSIS — G894 Chronic pain syndrome: Secondary | ICD-10-CM | POA: Diagnosis not present

## 2015-11-20 DIAGNOSIS — M542 Cervicalgia: Secondary | ICD-10-CM | POA: Diagnosis not present

## 2015-11-26 ENCOUNTER — Telehealth: Payer: Self-pay | Admitting: Physician Assistant

## 2015-11-26 DIAGNOSIS — G8929 Other chronic pain: Secondary | ICD-10-CM

## 2015-11-26 MED ORDER — NORTRIPTYLINE HCL 75 MG PO CAPS: 75.0000 mg | ORAL_CAPSULE | Freq: Every day | ORAL | Status: DC

## 2015-11-26 NOTE — Telephone Encounter (Signed)
Relation to [id number] Call back number:[phone] Pharmacy: [facility], [place] - [address] [phone] (Phone) [phone] (Fax)         Reason for call:  Patient requesting a refill nortriptyline (PAMELOR) 75 MG capsule

## 2015-11-26 NOTE — Telephone Encounter (Signed)
Refill sent per [facility] refill protocol/SLS  

## 2015-12-01 DIAGNOSIS — J449 Chronic obstructive pulmonary disease, unspecified: Secondary | ICD-10-CM | POA: Diagnosis not present

## 2015-12-18 DIAGNOSIS — M542 Cervicalgia: Secondary | ICD-10-CM | POA: Diagnosis not present

## 2015-12-18 DIAGNOSIS — Z79899 Other long term (current) drug therapy: Secondary | ICD-10-CM | POA: Diagnosis not present

## 2015-12-18 DIAGNOSIS — G894 Chronic pain syndrome: Secondary | ICD-10-CM | POA: Diagnosis not present

## 2015-12-18 DIAGNOSIS — M5412 Radiculopathy, cervical region: Secondary | ICD-10-CM | POA: Diagnosis not present

## 2015-12-18 DIAGNOSIS — Z79891 Long term (current) use of opiate analgesic: Secondary | ICD-10-CM | POA: Diagnosis not present

## 2015-12-18 DIAGNOSIS — M79602 Pain in left arm: Secondary | ICD-10-CM | POA: Diagnosis not present

## 2015-12-19 ENCOUNTER — Other Ambulatory Visit: Payer: Self-pay | Admitting: *Deleted

## 2015-12-19 MED ORDER — LEVOTHYROXINE SODIUM 75 MCG PO TABS: 75.0000 ug | ORAL_TABLET | Freq: Every day | ORAL | 1 refills | Status: DC

## 2015-12-19 NOTE — Telephone Encounter (Signed)
Rx sent to the pharmacy by e-script.//AB/CMA 

## 2016-01-01 DIAGNOSIS — J449 Chronic obstructive pulmonary disease, unspecified: Secondary | ICD-10-CM | POA: Diagnosis not present

## 2016-01-07 ENCOUNTER — Ambulatory Visit (INDEPENDENT_AMBULATORY_CARE_PROVIDER_SITE_OTHER): Payer: Medicare Other | Admitting: Physician Assistant

## 2016-01-07 ENCOUNTER — Encounter: Payer: Self-pay | Admitting: Physician Assistant

## 2016-01-07 VITALS — BP 106/72 | HR 96 | Temp 98.2°F | Resp 18 | Ht 64.0 in | Wt 293.2 lb

## 2016-01-07 DIAGNOSIS — E039 Hypothyroidism, unspecified: Secondary | ICD-10-CM | POA: Diagnosis not present

## 2016-01-07 DIAGNOSIS — Z794 Long term (current) use of insulin: Secondary | ICD-10-CM

## 2016-01-07 DIAGNOSIS — Z114 Encounter for screening for human immunodeficiency virus [HIV]: Secondary | ICD-10-CM | POA: Diagnosis not present

## 2016-01-07 DIAGNOSIS — E119 Type 2 diabetes mellitus without complications: Secondary | ICD-10-CM | POA: Diagnosis not present

## 2016-01-07 DIAGNOSIS — I10 Essential (primary) hypertension: Secondary | ICD-10-CM

## 2016-01-07 DIAGNOSIS — E785 Hyperlipidemia, unspecified: Secondary | ICD-10-CM

## 2016-01-07 DIAGNOSIS — B354 Tinea corporis: Secondary | ICD-10-CM

## 2016-01-07 DIAGNOSIS — J449 Chronic obstructive pulmonary disease, unspecified: Secondary | ICD-10-CM

## 2016-01-07 LAB — HIV ANTIBODY (ROUTINE TESTING W REFLEX): HIV 1&2 Ab, 4th Generation: NONREACTIVE

## 2016-01-07 MED ORDER — CLOTRIMAZOLE-BETAMETHASONE 1-0.05 % EX CREA: 1.0000 "application " | TOPICAL_CREAM | Freq: Two times a day (BID) | CUTANEOUS | 0 refills | Status: DC

## 2016-01-07 MED ORDER — ALBUTEROL SULFATE HFA 108 (90 BASE) MCG/ACT IN AERS: 1.0000 | INHALATION_SPRAY | Freq: Four times a day (QID) | RESPIRATORY_TRACT | 5 refills | Status: DC | PRN

## 2016-01-07 MED ORDER — NYSTATIN 100000 UNIT/GM EX POWD: 1.0000 | Freq: Two times a day (BID) | CUTANEOUS | 2 refills | Status: DC | PRN

## 2016-01-07 MED ORDER — NORTRIPTYLINE HCL 25 MG PO CAPS: 25.0000 mg | ORAL_CAPSULE | Freq: Every day | ORAL | 2 refills | Status: DC

## 2016-01-07 NOTE — Progress Notes (Signed)
Patient presents to clinic today for follow-up of multiple chronic problems.  Hypertension -- Patient most recently with normotensive BP. Lasix is taking as needed for swelling of lower extremities. Patient has been making great changes to her diet. Exercise is limited due to chronic pain and COPD. Patient denies chest pain, palpitations, lightheadedness, dizziness, vision changes or frequent headaches.  BP Readings from Last 3 Encounters:  01/07/16 106/72  07/28/15 112/68  07/23/15 130/60   COPD -- Followed by Pulmonology. Currently on O2 at 2l/min, sometimes increased to 3L when exerting herselg. Is taking pulmonary medications as directed. No recent exacerbations. Is trying to keep out of the heat.   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 Is currently on regimen of Januvia, Lantus and Humalog. Endorses taking medications as directed..  Due for foot exam and eye examination. Denies history of retinopathy. + history of neuropathy.   Chronic Pain -- Followed by Pain Management. Patient most recently placed on a regimen of Embeda and Zanaflex. Continues chronic Pamelor prescribed by this office. Is doing well overall, but still having significant breakthrough pain. Sees specialist this coming week.   Hyperlipidemia -- Patient currently on Pravachol 20 mg daily. Endorses taking daily as directed. Again is working on her diet, watching food choices and portion sizes.  Hypothyroidism -- Currently on levothyroxine 75 mcg daily. Is taking as directed. Is due for repeat TSH testing.  Past Medical History:  Diagnosis Date  . Abdominal mass of other site   . Cervical compression fracture ([place])   . Chronic kidney disease, stage 3    Borderline Stage 2-3  . Chronic lower limb pain   . COPD (chronic obstructive pulmonary disease) ([place])   . CTS (carpal tunnel syndrome)   . Depression   . Diabetes ([place])    Type II  .  Fibromyalgia   . Hepatitis C   . Hypercholesteremia   . Hypertension   . Hypothyroidism   . Morbid obesity ([place])   . Neuropathy ([place])    Diabetes  . OSA (obstructive sleep apnea)   . Osteoarthritis   . Renal cancer ([place])    Left Kidney Removed  . RLS (restless legs syndrome)   . Syncope   . Venous stasis     Current Outpatient Prescriptions on File Prior to Visit  Medication Sig Dispense Refill  . Fluticasone-Salmeterol (ADVAIR DISKUS) 500-50 MCG/DOSE AEPB Inhale 1 puff into the lungs once. 60 each 5  . furosemide (LASIX) 20 MG tablet Take 1 tablet (20 mg total) by mouth daily as needed. (Patient taking differently: Take 20 mg by mouth daily. ) 30 tablet 0  . insulin glargine (LANTUS) 100 UNIT/ML injection Inject 0.3 mLs (30 Units total) into the skin daily. 30 mL 2  . insulin lispro (HUMALOG) 100 UNIT/ML injection Inject 0.15 mLs (15 Units total) into the skin 3 (three) times daily as needed for high blood sugar. 10 mL 5  . Insulin Syringe-Needle U-100 30G X 1/2" 1 ML MISC Use 4 times daily as directed. 100 each 3  . ipratropium-albuterol (DUONEB) 0.5-2.5 (3) MG/3ML SOLN Take 3 mLs by nebulization 2 (two) times daily.    [name] Kitchen levothyroxine (SYNTHROID, LEVOTHROID) 75 MCG tablet Take 1 tablet (75 mcg total) by mouth daily before breakfast. 90 tablet 1  . nortriptyline (PAMELOR) 75 MG capsule Take 1 capsule (75 mg total) by mouth at bedtime. 30 capsule 2  . pravastatin (PRAVACHOL) 20 MG tablet Take  1 tablet (20 mg total) by mouth daily. 30 tablet 2  . sitaGLIPtin (JANUVIA) 50 MG tablet Take 1 tablet (50 mg total) by mouth daily. 60 tablet 2  . tiotropium (SPIRIVA) 18 MCG inhalation capsule Place 1 capsule (18 mcg total) into inhaler and inhale daily. 30 capsule 11   No current facility-administered medications on file prior to visit.     Allergies  Allergen Reactions  . Gabapentin Anaphylaxis  . Lyrica [Pregabalin] Shortness Of Breath    Trouble breathing  . Ketorolac Tromethamine  Hives  . Lisinopril Cough    Family History  Problem Relation Age of Onset  . Heart attack Mother 28    Deceased  . Heart disease Mother   . Emphysema Mother   . COPD Father 61    Deceased  . Emphysema Father   . Alcoholism Father   . Alcoholism Mother   . Esophageal varices Father   . Alcoholism Paternal Grandfather   . Diabetes Maternal Grandmother   . Heart disease Maternal Grandmother   . Lung cancer Maternal Grandfather   . Emphysema Maternal Grandfather   . Brain cancer Maternal Aunt   . Diabetes Sister   . Heart defect Sister   . Obesity Son   . Breast cancer Maternal Aunt     Social History   Social History  . Marital status: Single    Spouse name: N/A  . Number of children: N/A  . Years of education: N/A   Social History Main Topics  . Smoking status: Former Smoker    Packs/day: 2.50    Years: 45.00    Types: Cigarettes    Quit date: 09/25/2013  . Smokeless tobacco: Never Used  . Alcohol use No  . Drug use: No  . Sexual activity: No   Other Topics Concern  . None   Social History Narrative  . None   Review of Systems - See HPI.  All other ROS are negative.  BP 106/72 (BP Location: Left Arm, Patient Position: Sitting, Cuff Size: Large)   Pulse 96   Temp 98.2 F (36.8 C) (Oral)   Resp 18   Ht 5\' 4"  (1.626 m)   Wt 293 lb 4 oz (133 kg)   SpO2 91% Comment: 2 ltr/pm nasal  BMI 50.34 kg/m   Physical Exam  Constitutional: She is oriented to person, place, and time and well-developed, well-nourished, and in no distress.  HENT:  Head: Normocephalic and atraumatic.  Eyes: Conjunctivae are normal.  Neck: Neck supple.  Cardiovascular: Normal rate, regular rhythm, normal heart sounds and intact distal pulses.   Pulmonary/Chest: Effort normal and breath sounds normal. No respiratory distress. She has no wheezes. She has no rales. She exhibits no tenderness.  Neurological: She is alert and oriented to person, place, and time.  Skin: Skin is warm and  dry. No rash noted.  Psychiatric: Affect normal.  Vitals reviewed.  Diabetic Foot Form - Detailed   Diabetic Foot Exam - detailed Diabetic Foot exam was performed with the following findings:  Yes 01/07/2016 11:00 AM  Visual Foot Exam completed.:  Yes  Is there a history of foot ulcer?:  No Can the patient see the bottom of their feet?:  No Are the shoes appropriate in style and fit?:  Yes Is there swelling or and abnormal foot shape?:  No Are the toenails long?:  Yes Are the toenails thick?:  No Do you have pain in calf while walking?:  No Is there a claw toe  deformity?:  No Is there elevated skin temparature?:  No Is there limited skin dorsiflexion?:  No Is there foot or ankle muscle weakness?:  No Are the toenails ingrown?:  No Normal Range of Motion:  Yes Pulse Foot Exam completed.:  Yes  Right posterior Tibialias:  Present Left posterior Tibialias:  Present  Right Dorsalis Pedis:  Present Left Dorsalis Pedis:  Present  Sensory Foot Exam Completed.:  Yes Swelling:  No Semmes-Weinstein Monofilament Test R Foot Test Control:  Neg L Foot Test Control:  Neg  R Site 1-Great Toe:  Neg L Site 1-Great Toe:  Neg  R Site 4:  Neg L Site 4:  Neg  R Site 5:  Neg L Site 5:  Neg    Comments:  Monofilament testing within normal limits.    Assessment/Plan: Thyroid activity decreased Will repeat labs today to further assess.  Diabetes mellitus, type II, insulin dependent ([place]) Foot exam updated and within normal limits. She is to schedule eye examination. Will obtain repeat labs today. Referral to Nephrology placed.  COPD GOLDIII with min reversibility  Followed by Pulmonology. Endorses doing very well. Vitals stable.  Essential hypertension BP stable. Continue current regimen. Will check labs today.  Hyperlipidemia Will check fasting lipids today.    [name], PA-C

## 2016-01-07 NOTE — Progress Notes (Signed)
Pre visit review using our clinic review tool, if applicable. No additional management support is needed unless otherwise documented below in the visit note/SLS  

## 2016-01-07 NOTE — Patient Instructions (Signed)
Please go to the lab for blood work. I will call you with your results.   Continue medications as directed, taking the new total dose of Pamelor (100 mg) daily. Let me know how this works for you.  Please go downstairs to schedule your screening mammogram. Please let me know if you have difficulty getting in with your eye doctor.  I am placing a referral for you to Nephrology. You will be contacted to schedule an appointment.  Follow-up with me in 3 months.

## 2016-01-08 LAB — COMPREHENSIVE METABOLIC PANEL
ALBUMIN: 4.1 g/dL (ref 3.5–5.2)
ALK PHOS: 70 U/L (ref 39–117)
ALT: 12 U/L (ref 0–35)
AST: 17 U/L (ref 0–37)
BUN: 17 mg/dL (ref 6–23)
CHLORIDE: 97 meq/L (ref 96–112)
CO2: 39 mEq/L — ABNORMAL HIGH (ref 19–32)
CREATININE: 1.01 mg/dL (ref 0.40–1.20)
Calcium: 9.2 mg/dL (ref 8.4–10.5)
GFR: 58.94 mL/min — ABNORMAL LOW (ref >60.00)
Glucose, Bld: 50 mg/dL — ABNORMAL LOW (ref 70–99)
Potassium: 4.1 mEq/L (ref 3.5–5.1)
SODIUM: 142 meq/L (ref 135–145)
TOTAL PROTEIN: 7.4 g/dL (ref 6.0–8.3)
Total Bilirubin: 0.3 mg/dL (ref 0.2–1.2)

## 2016-01-08 LAB — MICROALBUMIN / CREATININE URINE RATIO
Creatinine,U: 126.7 mg/dL
Microalb Creat Ratio: 2 mg/g (ref 0.0–30.0)
Microalb, Ur: 2.5 mg/dL — ABNORMAL HIGH (ref 0.0–1.9)

## 2016-01-08 LAB — HEMOGLOBIN A1C: HEMOGLOBIN A1C: 5.6 % (ref 4.6–6.5)

## 2016-01-08 LAB — TSH: TSH: 4.83 u[IU]/mL — AB (ref 0.35–4.50)

## 2016-01-12 ENCOUNTER — Other Ambulatory Visit: Payer: Self-pay | Admitting: Physician Assistant

## 2016-01-12 DIAGNOSIS — R7989 Other specified abnormal findings of blood chemistry: Secondary | ICD-10-CM

## 2016-01-15 DIAGNOSIS — M542 Cervicalgia: Secondary | ICD-10-CM | POA: Diagnosis not present

## 2016-01-15 DIAGNOSIS — M79602 Pain in left arm: Secondary | ICD-10-CM | POA: Diagnosis not present

## 2016-01-15 DIAGNOSIS — M47817 Spondylosis without myelopathy or radiculopathy, lumbosacral region: Secondary | ICD-10-CM | POA: Diagnosis not present

## 2016-01-15 DIAGNOSIS — M5412 Radiculopathy, cervical region: Secondary | ICD-10-CM | POA: Diagnosis not present

## 2016-01-15 DIAGNOSIS — Z79891 Long term (current) use of opiate analgesic: Secondary | ICD-10-CM | POA: Diagnosis not present

## 2016-01-15 DIAGNOSIS — G894 Chronic pain syndrome: Secondary | ICD-10-CM | POA: Diagnosis not present

## 2016-01-15 DIAGNOSIS — M5137 Other intervertebral disc degeneration, lumbosacral region: Secondary | ICD-10-CM | POA: Diagnosis not present

## 2016-01-15 DIAGNOSIS — Z79899 Other long term (current) drug therapy: Secondary | ICD-10-CM | POA: Diagnosis not present

## 2016-01-15 DIAGNOSIS — M1288 Other specific arthropathies, not elsewhere classified, other specified site: Secondary | ICD-10-CM | POA: Diagnosis not present

## 2016-01-16 DIAGNOSIS — E785 Hyperlipidemia, unspecified: Secondary | ICD-10-CM | POA: Insufficient documentation

## 2016-01-16 DIAGNOSIS — I10 Essential (primary) hypertension: Secondary | ICD-10-CM | POA: Insufficient documentation

## 2016-01-16 NOTE — Assessment & Plan Note (Signed)
Will repeat labs today to further assess.

## 2016-01-16 NOTE — Assessment & Plan Note (Signed)
Will check fasting lipids today.  

## 2016-01-16 NOTE — Assessment & Plan Note (Signed)
Followed by Pulmonology. Endorses doing very well. Vitals stable.

## 2016-01-16 NOTE — Assessment & Plan Note (Signed)
BP stable. Continue current regimen. Will check labs today.

## 2016-01-16 NOTE — Assessment & Plan Note (Signed)
Foot exam updated and within normal limits. She is to schedule eye examination. Will obtain repeat labs today. Referral to Nephrology placed.

## 2016-02-01 DIAGNOSIS — J449 Chronic obstructive pulmonary disease, unspecified: Secondary | ICD-10-CM | POA: Diagnosis not present

## 2016-02-12 DIAGNOSIS — M542 Cervicalgia: Secondary | ICD-10-CM | POA: Diagnosis not present

## 2016-02-12 DIAGNOSIS — M5412 Radiculopathy, cervical region: Secondary | ICD-10-CM | POA: Diagnosis not present

## 2016-02-12 DIAGNOSIS — Z79899 Other long term (current) drug therapy: Secondary | ICD-10-CM | POA: Diagnosis not present

## 2016-02-12 DIAGNOSIS — Z79891 Long term (current) use of opiate analgesic: Secondary | ICD-10-CM | POA: Diagnosis not present

## 2016-02-12 DIAGNOSIS — G894 Chronic pain syndrome: Secondary | ICD-10-CM | POA: Diagnosis not present

## 2016-03-02 DIAGNOSIS — J449 Chronic obstructive pulmonary disease, unspecified: Secondary | ICD-10-CM | POA: Diagnosis not present

## 2016-03-18 DIAGNOSIS — M5137 Other intervertebral disc degeneration, lumbosacral region: Secondary | ICD-10-CM | POA: Diagnosis not present

## 2016-03-18 DIAGNOSIS — M5412 Radiculopathy, cervical region: Secondary | ICD-10-CM | POA: Diagnosis not present

## 2016-03-18 DIAGNOSIS — G894 Chronic pain syndrome: Secondary | ICD-10-CM | POA: Diagnosis not present

## 2016-03-18 DIAGNOSIS — Z79891 Long term (current) use of opiate analgesic: Secondary | ICD-10-CM | POA: Diagnosis not present

## 2016-03-18 DIAGNOSIS — M47817 Spondylosis without myelopathy or radiculopathy, lumbosacral region: Secondary | ICD-10-CM | POA: Diagnosis not present

## 2016-03-18 DIAGNOSIS — Z79899 Other long term (current) drug therapy: Secondary | ICD-10-CM | POA: Diagnosis not present

## 2016-03-19 ENCOUNTER — Other Ambulatory Visit: Payer: Self-pay | Admitting: Physician Assistant

## 2016-03-19 DIAGNOSIS — G8929 Other chronic pain: Secondary | ICD-10-CM

## 2016-03-19 MED ORDER — NORTRIPTYLINE HCL 75 MG PO CAPS: 75.0000 mg | ORAL_CAPSULE | Freq: Every day | ORAL | 2 refills | Status: DC

## 2016-04-02 DIAGNOSIS — J449 Chronic obstructive pulmonary disease, unspecified: Secondary | ICD-10-CM | POA: Diagnosis not present

## 2016-04-05 ENCOUNTER — Telehealth: Payer: Self-pay | Admitting: *Deleted

## 2016-04-05 DIAGNOSIS — Z794 Long term (current) use of insulin: Principal | ICD-10-CM

## 2016-04-05 DIAGNOSIS — E119 Type 2 diabetes mellitus without complications: Secondary | ICD-10-CM

## 2016-04-05 MED ORDER — SITAGLIPTIN PHOSPHATE 50 MG PO TABS: 50.0000 mg | ORAL_TABLET | Freq: Every day | ORAL | 2 refills | Status: DC

## 2016-04-05 MED ORDER — "INSULIN SYRINGE-NEEDLE U-100 30G X 1/2"" 1 ML MISC": 3 refills | Status: DC

## 2016-04-05 MED ORDER — INSULIN LISPRO 100 UNIT/ML ~~LOC~~ SOLN: 15.0000 [IU] | Freq: Three times a day (TID) | SUBCUTANEOUS | 5 refills | Status: DC | PRN

## 2016-04-05 NOTE — Telephone Encounter (Signed)
Rx request to pharmacy/SLS  

## 2016-04-07 ENCOUNTER — Ambulatory Visit: Payer: Medicare Other | Admitting: Physician Assistant

## 2016-04-13 ENCOUNTER — Ambulatory Visit (INDEPENDENT_AMBULATORY_CARE_PROVIDER_SITE_OTHER): Payer: Medicare Other | Admitting: Physician Assistant

## 2016-04-13 ENCOUNTER — Encounter: Payer: Self-pay | Admitting: Physician Assistant

## 2016-04-13 VITALS — BP 120/72 | HR 92 | Temp 98.3°F | Resp 16 | Ht 64.0 in | Wt 285.0 lb

## 2016-04-13 DIAGNOSIS — E119 Type 2 diabetes mellitus without complications: Secondary | ICD-10-CM

## 2016-04-13 DIAGNOSIS — E039 Hypothyroidism, unspecified: Secondary | ICD-10-CM

## 2016-04-13 DIAGNOSIS — Z794 Long term (current) use of insulin: Secondary | ICD-10-CM

## 2016-04-13 DIAGNOSIS — B354 Tinea corporis: Secondary | ICD-10-CM | POA: Diagnosis not present

## 2016-04-13 DIAGNOSIS — I10 Essential (primary) hypertension: Secondary | ICD-10-CM | POA: Diagnosis not present

## 2016-04-13 DIAGNOSIS — N189 Chronic kidney disease, unspecified: Secondary | ICD-10-CM

## 2016-04-13 MED ORDER — CLOTRIMAZOLE-BETAMETHASONE 1-0.05 % EX CREA: 1.0000 "application " | TOPICAL_CREAM | Freq: Two times a day (BID) | CUTANEOUS | 0 refills | Status: DC

## 2016-04-13 NOTE — Progress Notes (Signed)
Pre visit review using our clinic review tool, if applicable. No additional management support is needed unless otherwise documented below in the visit note. 

## 2016-04-13 NOTE — Patient Instructions (Addendum)
Please go to the lab for blood work. I will call you with your results.  Please continue medications as directed. Apply the yeast cream to the area in question. Keep skin clean and dry.   Please keep a well-balanced diet. Follow-up with your specialist as scheduled.  Please schedule an eye exam. Please reconsider a mammogram.

## 2016-04-13 NOTE — Progress Notes (Signed)
Patient presents to clinic today for follow-up of chronic medical conditions.   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  Patient endorses taking medications as directed. Endorses fasting sugars averaging 90s. Non fasting sugars averaging around 180-200. Endorses chronic tingling of feet bilaterally without worsening. Patient is unable to tolerate Gabapentin or Lyrica due to anaphylaxis. Using Nortriptyline to help with pain from neuropathy and insomnia. Doing well on this regimen. Denies any fall or loss of balance. Last eye exam in February of this year per patient. Has follow-up scheduled. She will get records from her specialist.   CKD stage III -- Denies hearing from Nephrologist. Needs new referral. Will place today. Last creatinine and BUN within normal limits. GFR at 59. Denies oliguria.   Endorses last mammogram many years ago. Declines at present. Denies history of abnormal mammogram.  Patient with history of tinea of skin, typically underneath the breasts bilaterally. Well-controlled previously with Lotrisone and powders. Notes a flare-up over the past couple of weeks. Has been keeping clean and dry but does not have her cream. Requesting refill.  Thyroid -- overdue for TSH level as last TSH was mildly elevated. Patient endorses taking her current dose of thryoid medication as directed.  COPD --  Followed by Pulmonology. Is on round-the-clock O2 at 2L/min. Denies any chest congestion or recent exacerbation of COPD. Denies complaints today.   Past Medical History:  Diagnosis Date  . Abdominal mass of other site   . Cervical compression fracture ([place])   . Chronic kidney disease, stage 3    Borderline Stage 2-3  . Chronic lower limb pain   . COPD (chronic obstructive pulmonary disease) ([place])   . CTS (carpal tunnel syndrome)   . Depression     . Diabetes ([place])    Type II  . Fibromyalgia   . Hepatitis C   . Hypercholesteremia   . Hypertension   . Hypothyroidism   . Morbid obesity ([place])   . Neuropathy ([place])    Diabetes  . OSA (obstructive sleep apnea)   . Osteoarthritis   . Renal cancer ([place])    Left Kidney Removed  . RLS (restless legs syndrome)   . Syncope   . Venous stasis     Current Outpatient Prescriptions on File Prior to Visit  Medication Sig Dispense Refill  . albuterol (PROVENTIL HFA;VENTOLIN HFA) 108 (90 Base) MCG/ACT inhaler Inhale 1-2 puffs into the lungs every 6 (six) hours as needed for wheezing or shortness of breath. 6.7 g 5  . Fluticasone-Salmeterol (ADVAIR DISKUS) 500-50 MCG/DOSE AEPB Inhale 1 puff into the lungs once. 60 each 5  . furosemide (LASIX) 20 MG tablet Take 1 tablet (20 mg total) by mouth daily as needed. (Patient taking differently: Take 20 mg by mouth daily. ) 30 tablet 0  . insulin glargine (LANTUS) 100 UNIT/ML injection Inject 0.3 mLs (30 Units total) into the skin daily. (Patient taking differently: Inject 31 Units into the skin daily. ) 30 mL 2  . insulin lispro (HUMALOG) 100 UNIT/ML injection Inject 0.15 mLs (15 Units total) into the skin 3 (three) times daily as needed for high blood sugar. Dx: E11.9 10 mL 5  . Insulin Syringe-Needle U-100 30G X 1/2" 1 ML MISC Use 4 times daily as directed. 100 each 3  . ipratropium-albuterol (DUONEB) 0.5-2.5 (3) MG/3ML SOLN Take  3 mLs by nebulization 2 (two) times daily.    [name] Kitchen levothyroxine (SYNTHROID, LEVOTHROID) 75 MCG tablet Take 1 tablet (75 mcg total) by mouth daily before breakfast. 90 tablet 1  . Morphine-Naltrexone (EMBEDA) 50-2 MG CPCR Take 1 capsule by mouth daily.    . nortriptyline (PAMELOR) 25 MG capsule Take 1 capsule (25 mg total) by mouth at bedtime. 30 capsule 2  . nortriptyline (PAMELOR) 75 MG capsule Take 1 capsule (75 mg total) by mouth at bedtime. 30 capsule 2  . nystatin (NYSTATIN) powder Apply 1 Bottle topically 2 (two) times  daily as needed (yeast infection). 15 g 2  . pravastatin (PRAVACHOL) 20 MG tablet Take 1 tablet (20 mg total) by mouth daily. 30 tablet 2  . sitaGLIPtin (JANUVIA) 50 MG tablet Take 1 tablet (50 mg total) by mouth daily. 60 tablet 2  . tiotropium (SPIRIVA) 18 MCG inhalation capsule Place 1 capsule (18 mcg total) into inhaler and inhale daily. 30 capsule 11  . tiZANidine (ZANAFLEX) 2 MG tablet Take 2-4 mg by mouth every 12 (twelve) hours as needed for muscle spasms.     No current facility-administered medications on file prior to visit.     Allergies  Allergen Reactions  . Gabapentin Anaphylaxis  . Lyrica [Pregabalin] Shortness Of Breath    Trouble breathing  . Ketorolac Tromethamine Hives  . Lisinopril Cough    Family History  Problem Relation Age of Onset  . Heart attack Mother 19    Deceased  . Heart disease Mother   . Emphysema Mother   . COPD Father 70    Deceased  . Emphysema Father   . Alcoholism Father   . Alcoholism Mother   . Esophageal varices Father   . Alcoholism Paternal Grandfather   . Diabetes Maternal Grandmother   . Heart disease Maternal Grandmother   . Lung cancer Maternal Grandfather   . Emphysema Maternal Grandfather   . Brain cancer Maternal Aunt   . Diabetes Sister   . Heart defect Sister   . Obesity Son   . Breast cancer Maternal Aunt     Social History   Social History  . Marital status: Single    Spouse name: N/A  . Number of children: N/A  . Years of education: N/A   Social History Main Topics  . Smoking status: Former Smoker    Packs/day: 2.50    Years: 45.00    Types: Cigarettes    Quit date: 09/25/2013  . Smokeless tobacco: Never Used  . Alcohol use No  . Drug use: No  . Sexual activity: No   Other Topics Concern  . None   Social History Narrative  . None   Review of Systems - See HPI.  All other ROS are negative.  BP 120/72   Pulse 92   Temp 98.3 F (36.8 C) (Oral)   Resp 16   Ht 5\' 4"  (1.626 m)   Wt 285 lb  (129.3 kg)   SpO2 93%   BMI 48.92 kg/m   Physical Exam  Constitutional: She is oriented to person, place, and time and well-developed, well-nourished, and in no distress.  HENT:  Head: Normocephalic and atraumatic.  Eyes: Conjunctivae are normal.  Neck: Neck supple.  Cardiovascular: Normal rate, regular rhythm, normal heart sounds and intact distal pulses.   Pulmonary/Chest: Effort normal and breath sounds normal. No respiratory distress. She has no wheezes. She has no rales. She exhibits no tenderness.  Lymphadenopathy:    She has no  cervical adenopathy.  Neurological: She is alert and oriented to person, place, and time.  Skin: Skin is warm and dry.  Psychiatric: Affect normal.  Vitals reviewed.  Assessment/Plan: Thyroid activity decreased Taking medication as directed. Will repeat TSH today.  Essential hypertension Stable. Continue current regimen. Recent BMP within normal limits.  Diabetes mellitus, type II, insulin dependent ([place]) Repeat labs today. Patient to schedule next eye examination. Continue current regimen. Dietary recommendations reviewed. FU scheduled.  Lab Results  Component Value Date   HGBA1C 5.6 01/07/2016     CKD (chronic kidney disease) Referral to nephrology replaced.     [name], PA-C

## 2016-04-14 DIAGNOSIS — N183 Chronic kidney disease, stage 3 unspecified: Secondary | ICD-10-CM | POA: Insufficient documentation

## 2016-04-14 LAB — TSH: TSH: 3.94 u[IU]/mL (ref 0.35–4.50)

## 2016-04-14 NOTE — Assessment & Plan Note (Signed)
Taking medication as directed. Will repeat TSH today.

## 2016-04-14 NOTE — Assessment & Plan Note (Signed)
Repeat labs today. Patient to schedule next eye examination. Continue current regimen. Dietary recommendations reviewed. FU scheduled.  Lab Results  Component Value Date   HGBA1C 5.6 01/07/2016

## 2016-04-14 NOTE — Assessment & Plan Note (Signed)
Referral to nephrology replaced.

## 2016-04-14 NOTE — Assessment & Plan Note (Signed)
Stable. Continue current regimen. Recent BMP within normal limits.

## 2016-04-15 DIAGNOSIS — M5412 Radiculopathy, cervical region: Secondary | ICD-10-CM | POA: Diagnosis not present

## 2016-04-15 DIAGNOSIS — Z79891 Long term (current) use of opiate analgesic: Secondary | ICD-10-CM | POA: Diagnosis not present

## 2016-04-15 DIAGNOSIS — M542 Cervicalgia: Secondary | ICD-10-CM | POA: Diagnosis not present

## 2016-04-15 DIAGNOSIS — Z79899 Other long term (current) drug therapy: Secondary | ICD-10-CM | POA: Diagnosis not present

## 2016-04-15 DIAGNOSIS — G894 Chronic pain syndrome: Secondary | ICD-10-CM | POA: Diagnosis not present

## 2016-04-15 DIAGNOSIS — M545 Low back pain: Secondary | ICD-10-CM | POA: Diagnosis not present

## 2016-04-29 DIAGNOSIS — M47817 Spondylosis without myelopathy or radiculopathy, lumbosacral region: Secondary | ICD-10-CM | POA: Diagnosis not present

## 2016-05-02 DIAGNOSIS — J449 Chronic obstructive pulmonary disease, unspecified: Secondary | ICD-10-CM | POA: Diagnosis not present

## 2016-05-11 ENCOUNTER — Other Ambulatory Visit: Payer: Self-pay | Admitting: Physician Assistant

## 2016-05-11 NOTE — Telephone Encounter (Signed)
Is there anything that we can do for patient?

## 2016-05-11 NOTE — Telephone Encounter (Signed)
Pt states that she came here last week and brought Cody a cake and they spoke about pt getting an Rx fir chantix. Pt states that he must have forgot to call this in for pt and pt states that she needs this before the end of year so insurance will pay for it. Romelle Starcher said she remembers this conversation with pt.

## 2016-05-12 MED ORDER — VARENICLINE TARTRATE 0.5 MG X 11 & 1 MG X 42 PO MISC
ORAL | 0 refills | Status: DC
Start: 2016-05-12 — End: 2016-05-12

## 2016-05-12 MED ORDER — VARENICLINE TARTRATE 1 MG PO TABS: 1.0000 mg | ORAL_TABLET | Freq: Two times a day (BID) | ORAL | 0 refills | Status: DC

## 2016-05-12 MED ORDER — VARENICLINE TARTRATE 0.5 MG X 11 & 1 MG X 42 PO MISC: ORAL | 0 refills | Status: DC

## 2016-05-12 NOTE — Addendum Note (Signed)
Addended by: Katina Dung on: 05/12/2016 09:52 AM   Modules accepted: Orders

## 2016-05-12 NOTE — Telephone Encounter (Signed)
Orders sent to pharmacy - Patient aware that it was sent in and to call with any questions.

## 2016-05-12 NOTE — Telephone Encounter (Signed)
[place] for Chantix starting pack and continuation pack to be sent to pharmacy for pt.  She should take as directed

## 2016-05-12 NOTE — Addendum Note (Signed)
Addended by: Katina Dung on: 05/12/2016 09:56 AM   Modules accepted: Orders

## 2016-05-13 DIAGNOSIS — Z79891 Long term (current) use of opiate analgesic: Secondary | ICD-10-CM | POA: Diagnosis not present

## 2016-05-13 DIAGNOSIS — Z79899 Other long term (current) drug therapy: Secondary | ICD-10-CM | POA: Diagnosis not present

## 2016-05-13 DIAGNOSIS — G894 Chronic pain syndrome: Secondary | ICD-10-CM | POA: Diagnosis not present

## 2016-05-13 DIAGNOSIS — M47817 Spondylosis without myelopathy or radiculopathy, lumbosacral region: Secondary | ICD-10-CM | POA: Diagnosis not present

## 2016-05-18 ENCOUNTER — Telehealth: Payer: Self-pay | Admitting: Physician Assistant

## 2016-05-18 DIAGNOSIS — E785 Hyperlipidemia, unspecified: Secondary | ICD-10-CM

## 2016-05-18 NOTE — Telephone Encounter (Signed)
Advised family member [name] patient is due for fasting lipids. She is scheduled for lab visit tomorrow.

## 2016-05-18 NOTE — Telephone Encounter (Signed)
Reviewing chart, patient is overdue for fasting lipid panel.  Can we please place order and schedule patient for test?

## 2016-05-19 ENCOUNTER — Other Ambulatory Visit (INDEPENDENT_AMBULATORY_CARE_PROVIDER_SITE_OTHER): Payer: Medicare Other

## 2016-05-19 DIAGNOSIS — E785 Hyperlipidemia, unspecified: Secondary | ICD-10-CM | POA: Diagnosis not present

## 2016-05-19 LAB — LIPID PANEL
CHOL/HDL RATIO: 3
Cholesterol: 109 mg/dL (ref 0–200)
HDL: 39.5 mg/dL (ref >39.00)
LDL CALC: 51 mg/dL (ref 0–99)
NONHDL: 69.12
TRIGLYCERIDES: 93 mg/dL (ref 0.0–149.0)
VLDL: 18.6 mg/dL (ref 0.0–40.0)

## 2016-06-02 DIAGNOSIS — J449 Chronic obstructive pulmonary disease, unspecified: Secondary | ICD-10-CM | POA: Diagnosis not present

## 2016-06-10 DIAGNOSIS — M542 Cervicalgia: Secondary | ICD-10-CM | POA: Diagnosis not present

## 2016-06-10 DIAGNOSIS — G894 Chronic pain syndrome: Secondary | ICD-10-CM | POA: Diagnosis not present

## 2016-06-10 DIAGNOSIS — M47817 Spondylosis without myelopathy or radiculopathy, lumbosacral region: Secondary | ICD-10-CM | POA: Diagnosis not present

## 2016-06-10 DIAGNOSIS — Z79891 Long term (current) use of opiate analgesic: Secondary | ICD-10-CM | POA: Diagnosis not present

## 2016-06-10 DIAGNOSIS — M5412 Radiculopathy, cervical region: Secondary | ICD-10-CM | POA: Diagnosis not present

## 2016-06-10 DIAGNOSIS — Z79899 Other long term (current) drug therapy: Secondary | ICD-10-CM | POA: Diagnosis not present

## 2016-06-15 DIAGNOSIS — Z905 Acquired absence of kidney: Secondary | ICD-10-CM | POA: Diagnosis not present

## 2016-06-15 DIAGNOSIS — N390 Urinary tract infection, site not specified: Secondary | ICD-10-CM | POA: Diagnosis not present

## 2016-06-15 DIAGNOSIS — E1129 Type 2 diabetes mellitus with other diabetic kidney complication: Secondary | ICD-10-CM | POA: Diagnosis not present

## 2016-06-15 DIAGNOSIS — N189 Chronic kidney disease, unspecified: Secondary | ICD-10-CM | POA: Diagnosis not present

## 2016-06-16 ENCOUNTER — Other Ambulatory Visit: Payer: Self-pay | Admitting: Nephrology

## 2016-06-16 DIAGNOSIS — N189 Chronic kidney disease, unspecified: Secondary | ICD-10-CM

## 2016-06-21 ENCOUNTER — Ambulatory Visit
Admission: RE | Admit: 2016-06-21 | Discharge: 2016-06-21 | Disposition: A | Discharge status: Home / Self Care | Payer: Medicare Other | Source: Ambulatory Visit | Attending: Nephrology | Admitting: Nephrology

## 2016-06-21 DIAGNOSIS — N183 Chronic kidney disease, stage 3 (moderate): Secondary | ICD-10-CM | POA: Diagnosis not present

## 2016-06-21 DIAGNOSIS — N189 Chronic kidney disease, unspecified: Secondary | ICD-10-CM

## 2016-06-25 ENCOUNTER — Other Ambulatory Visit: Payer: Self-pay | Admitting: Emergency Medicine

## 2016-06-25 DIAGNOSIS — E785 Hyperlipidemia, unspecified: Secondary | ICD-10-CM

## 2016-06-25 MED ORDER — PRAVASTATIN SODIUM 20 MG PO TABS: 20.0000 mg | ORAL_TABLET | Freq: Every day | ORAL | 1 refills | Status: DC

## 2016-07-03 DIAGNOSIS — J449 Chronic obstructive pulmonary disease, unspecified: Secondary | ICD-10-CM | POA: Diagnosis not present

## 2016-07-07 LAB — HM DIABETES EYE EXAM

## 2016-07-08 DIAGNOSIS — M5137 Other intervertebral disc degeneration, lumbosacral region: Secondary | ICD-10-CM | POA: Diagnosis not present

## 2016-07-08 DIAGNOSIS — M47817 Spondylosis without myelopathy or radiculopathy, lumbosacral region: Secondary | ICD-10-CM | POA: Diagnosis not present

## 2016-07-08 DIAGNOSIS — Z79899 Other long term (current) drug therapy: Secondary | ICD-10-CM | POA: Diagnosis not present

## 2016-07-08 DIAGNOSIS — G894 Chronic pain syndrome: Secondary | ICD-10-CM | POA: Diagnosis not present

## 2016-07-08 DIAGNOSIS — M5412 Radiculopathy, cervical region: Secondary | ICD-10-CM | POA: Diagnosis not present

## 2016-07-08 DIAGNOSIS — Z79891 Long term (current) use of opiate analgesic: Secondary | ICD-10-CM | POA: Diagnosis not present

## 2016-07-14 ENCOUNTER — Other Ambulatory Visit: Payer: Self-pay | Admitting: Physician Assistant

## 2016-07-14 DIAGNOSIS — G8929 Other chronic pain: Secondary | ICD-10-CM

## 2016-07-31 DIAGNOSIS — J449 Chronic obstructive pulmonary disease, unspecified: Secondary | ICD-10-CM | POA: Diagnosis not present

## 2016-08-04 ENCOUNTER — Other Ambulatory Visit: Payer: Self-pay | Admitting: Physician Assistant

## 2016-08-04 DIAGNOSIS — E119 Type 2 diabetes mellitus without complications: Secondary | ICD-10-CM

## 2016-08-04 DIAGNOSIS — Z794 Long term (current) use of insulin: Principal | ICD-10-CM

## 2016-08-05 ENCOUNTER — Telehealth: Payer: Self-pay | Admitting: Emergency Medicine

## 2016-08-05 DIAGNOSIS — Z79891 Long term (current) use of opiate analgesic: Secondary | ICD-10-CM | POA: Diagnosis not present

## 2016-08-05 DIAGNOSIS — M47817 Spondylosis without myelopathy or radiculopathy, lumbosacral region: Secondary | ICD-10-CM | POA: Diagnosis not present

## 2016-08-05 DIAGNOSIS — M542 Cervicalgia: Secondary | ICD-10-CM | POA: Diagnosis not present

## 2016-08-05 DIAGNOSIS — Z79899 Other long term (current) drug therapy: Secondary | ICD-10-CM | POA: Diagnosis not present

## 2016-08-05 DIAGNOSIS — M5412 Radiculopathy, cervical region: Secondary | ICD-10-CM | POA: Diagnosis not present

## 2016-08-05 DIAGNOSIS — G894 Chronic pain syndrome: Secondary | ICD-10-CM | POA: Diagnosis not present

## 2016-08-05 NOTE — Telephone Encounter (Signed)
LMOVM asking patient if she receives her diabetic supplies thru [facility]. We received a prescription order and wanted to verify before faxing.

## 2016-08-16 ENCOUNTER — Other Ambulatory Visit: Payer: Self-pay | Admitting: Physician Assistant

## 2016-08-17 ENCOUNTER — Encounter: Payer: Self-pay | Admitting: Physician Assistant

## 2016-08-17 ENCOUNTER — Ambulatory Visit (INDEPENDENT_AMBULATORY_CARE_PROVIDER_SITE_OTHER): Payer: Medicare Other | Admitting: Physician Assistant

## 2016-08-17 VITALS — BP 116/70 | HR 83 | Temp 98.6°F | Resp 14 | Ht 64.0 in | Wt 264.0 lb

## 2016-08-17 DIAGNOSIS — M7989 Other specified soft tissue disorders: Secondary | ICD-10-CM | POA: Diagnosis not present

## 2016-08-17 DIAGNOSIS — N189 Chronic kidney disease, unspecified: Secondary | ICD-10-CM | POA: Diagnosis not present

## 2016-08-17 DIAGNOSIS — Z794 Long term (current) use of insulin: Secondary | ICD-10-CM

## 2016-08-17 DIAGNOSIS — E039 Hypothyroidism, unspecified: Secondary | ICD-10-CM | POA: Diagnosis not present

## 2016-08-17 DIAGNOSIS — I10 Essential (primary) hypertension: Secondary | ICD-10-CM

## 2016-08-17 DIAGNOSIS — Z8619 Personal history of other infectious and parasitic diseases: Secondary | ICD-10-CM | POA: Diagnosis not present

## 2016-08-17 DIAGNOSIS — E119 Type 2 diabetes mellitus without complications: Secondary | ICD-10-CM

## 2016-08-17 DIAGNOSIS — D696 Thrombocytopenia, unspecified: Secondary | ICD-10-CM

## 2016-08-17 LAB — CBC
HCT: 40.5 % (ref 36.0–46.0)
Hemoglobin: 13.1 g/dL (ref 12.0–15.0)
MCHC: 32.4 g/dL (ref 30.0–36.0)
MCV: 84.7 fl (ref 78.0–100.0)
PLATELETS: 106 10*3/uL — AB (ref 150.0–400.0)
RBC: 4.78 Mil/uL (ref 3.87–5.11)
RDW: 18.6 % — ABNORMAL HIGH (ref 11.5–15.5)
WBC: 6.4 10*3/uL (ref 4.0–10.5)

## 2016-08-17 LAB — TSH: TSH: 6.26 u[IU]/mL — ABNORMAL HIGH (ref 0.35–4.50)

## 2016-08-17 LAB — URINALYSIS, ROUTINE W REFLEX MICROSCOPIC
BILIRUBIN URINE: NEGATIVE
HGB URINE DIPSTICK: NEGATIVE
Ketones, ur: NEGATIVE
LEUKOCYTES UA: NEGATIVE
NITRITE: NEGATIVE
RBC / HPF: NONE SEEN (ref 0–?)
Specific Gravity, Urine: 1.01 (ref 1.000–1.030)
TOTAL PROTEIN, URINE-UPE24: NEGATIVE
Urine Glucose: NEGATIVE
Urobilinogen, UA: 1 (ref 0.0–1.0)
WBC UA: NONE SEEN (ref 0–?)
pH: 7 (ref 5.0–8.0)

## 2016-08-17 LAB — COMPREHENSIVE METABOLIC PANEL
ALT: 10 U/L (ref 0–35)
AST: 15 U/L (ref 0–37)
Albumin: 3.8 g/dL (ref 3.5–5.2)
Alkaline Phosphatase: 76 U/L (ref 39–117)
BUN: 17 mg/dL (ref 6–23)
CALCIUM: 9 mg/dL (ref 8.4–10.5)
CHLORIDE: 98 meq/L (ref 96–112)
CO2: 37 meq/L — AB (ref 19–32)
Creatinine, Ser: 1.13 mg/dL (ref 0.40–1.20)
GFR: 51.68 mL/min — ABNORMAL LOW (ref >60.00)
GLUCOSE: 54 mg/dL — AB (ref 70–99)
Potassium: 4.1 mEq/L (ref 3.5–5.1)
SODIUM: 139 meq/L (ref 135–145)
Total Bilirubin: 0.3 mg/dL (ref 0.2–1.2)
Total Protein: 6.9 g/dL (ref 6.0–8.3)

## 2016-08-17 LAB — HEMOGLOBIN A1C: HEMOGLOBIN A1C: 5.7 % (ref 4.6–6.5)

## 2016-08-17 NOTE — Progress Notes (Signed)
Patient presents to clinic today for follow-up of chronic medical conditions.  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 Has follow-up scheduled every 6 months. Was started on losartan 25 mg at last visit one month ago.  Is currently on regimen of Lantus 31 units and Januvia 50 mg daily. Humalog following sliding scale. Very rare use.  Patient is up-to-date on immunizations and foot examination.  Last eye examination this past Monday. Patient endorses she is having notes sent over to our office. Patient is checking fasting sugars with levels averaging 100-110. Non-fasting sugars averaging 110-130s. Is following a low-carb diet. Is watching protein intake per Nephrology. Has increased exercise. Has lost 20 pounds intentionally since last visit.   Hyperlipidemia -- Currently on Pravachol 20 mg daily. Is taking as directed. Is watching her diet -- recently joining [employer]. Has lost 20 pounds since last visit.  Tobacco Use Disorder -- Has completely stopped smoking with help of Chantix.  Breast Cancer Screening -- Is overdue for screening mammogram. Agrees to screening. Is followed by GYN.   Patient also noting some pain in LLE over the past couple of days with some increased swelling to the area that has resolved. Denies redness or hardness of skin. States pain is mostly resolved today.  Lab Results  Component Value Date   HGBA1C 5.7 08/17/2016     Past Medical History:  Diagnosis Date  . Abdominal mass of other site   . Cervical compression fracture ([place])   . Chronic kidney disease, stage 3    Borderline Stage 2-3  . Chronic lower limb pain   . COPD (chronic obstructive pulmonary disease) ([place])   . CTS (carpal tunnel syndrome)   . Depression   . Diabetes ([place])    Type II  . Fibromyalgia   . Hepatitis C   . Hypercholesteremia   . Hypertension   . Hypothyroidism   . Morbid obesity ([place])   . Neuropathy ([place])    Diabetes  . OSA (obstructive sleep apnea)   . Osteoarthritis   . 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 RLS (restless legs syndrome)   . Syncope   . Venous stasis     Current Outpatient Prescriptions on File Prior to Visit  Medication Sig Dispense Refill  . albuterol (PROVENTIL HFA;VENTOLIN HFA) 108 (90 Base) MCG/ACT inhaler Inhale 1-2 puffs into the lungs every 6 (six) hours as needed for wheezing or shortness of breath. 6.7 g 5  . Fluticasone-Salmeterol (ADVAIR DISKUS) 500-50 MCG/DOSE AEPB Inhale 1 puff into the lungs once. 60 each 5  . furosemide (LASIX) 20 MG tablet Take 1 tablet (20 mg total) by mouth daily as needed. (Patient taking differently: Take 20 mg by mouth daily. ) 30 tablet 0  . insulin lispro (HUMALOG) 100 UNIT/ML injection Inject 0.15 mLs (15 Units total) into the skin 3 (three) times daily as needed for high blood sugar. Dx: E11.9 10 mL 5  . Insulin Syringe-Needle U-100 30G X 1/2" 1 ML MISC Use 4 times daily as directed. 100 each 3  . ipratropium-albuterol (DUONEB) 0.5-2.5 (3) MG/3ML SOLN Take 3 mLs by nebulization 2 (two) times daily.    [name] Kitchen LANTUS 100 UNIT/ML injection INJECT 0.3MLS (30 UNITS TOTAL) INTO THE SKIN EVERY DAY 30 mL 3  . Morphine-Naltrexone (EMBEDA) 50-2 MG CPCR Take 1 capsule by mouth daily.    . nortriptyline (PAMELOR) 25 MG capsule TAKE 1 CAPSULE BY MOUTH AT BEDTIME  30 capsule 3  . nortriptyline (PAMELOR) 75 MG capsule TAKE 1 CAPSULE BY MOUTH AT BEDTIME 30 capsule 3  . nystatin (NYSTATIN) powder Apply 1 Bottle topically 2 (two) times daily as needed (yeast infection). 15 g 2  . pravastatin (PRAVACHOL) 20 MG tablet Take 1 tablet (20 mg total) by mouth daily. 90 tablet 1  . sitaGLIPtin (JANUVIA) 50 MG tablet Take 1 tablet (50 mg total) by mouth daily. 60 tablet 2  . SPIRIVA HANDIHALER 18 MCG inhalation capsule PLACE 1 CAPSULE INTO THE INHALER AND INHALE DAILY 30 capsule 6  . tiZANidine (ZANAFLEX) 2 MG tablet Take 2-4 mg by mouth every 12 (twelve) hours  as needed for muscle spasms.     No current facility-administered medications on file prior to visit.     Allergies  Allergen Reactions  . Gabapentin Anaphylaxis  . Lyrica [Pregabalin] Shortness Of Breath    Trouble breathing  . Ketorolac Tromethamine Hives  . Lisinopril Cough    Family History  Problem Relation Age of Onset  . Heart attack Mother 80    Deceased  . Heart disease Mother   . Emphysema Mother   . Alcoholism Mother   . COPD Father 1    Deceased  . Emphysema Father   . Alcoholism Father   . Esophageal varices Father   . Alcoholism Paternal Grandfather   . Diabetes Maternal Grandmother   . Heart disease Maternal Grandmother   . Lung cancer Maternal Grandfather   . Emphysema Maternal Grandfather   . Brain cancer Maternal Aunt   . Diabetes Sister   . Heart defect Sister   . Obesity Son   . Breast cancer Maternal Aunt     Social History   Social History  . Marital status: Single    Spouse name: N/A  . Number of children: N/A  . Years of education: N/A   Social History Main Topics  . Smoking status: Former Smoker    Packs/day: 2.50    Years: 45.00    Types: Cigarettes    Quit date: 07/21/2016  . Smokeless tobacco: Never Used  . Alcohol use No  . Drug use: No  . Sexual activity: No   Other Topics Concern  . None   Social History Narrative  . None   Review of Systems - See HPI.  All other ROS are negative.  BP 116/70   Pulse 83   Temp 98.6 F (37 C) (Oral)   Resp 14   Ht 5\' 4"  (1.626 m)   Wt 264 lb (119.7 kg)   SpO2 95% Comment: 2.5 L  BMI 45.32 kg/m   Physical Exam  Constitutional: She is oriented to person, place, and time and well-developed, well-nourished, and in no distress.  HENT:  Head: Normocephalic and atraumatic.  Eyes: Conjunctivae are normal.  Neck: Neck supple.  Cardiovascular: Normal rate, regular rhythm, normal heart sounds and intact distal pulses.   Pulmonary/Chest: Effort normal and breath sounds normal. No  respiratory distress. She has no wheezes. She has no rales. She exhibits no tenderness.  Musculoskeletal:  Chronic leg swelling bilaterally with venous stasis dermatitis. No ulceration noted. Area of tenderness of LLE reviewed with patient. No reproducible tenderness with palpation. No erythema noted. No evidence of superficial thrombus on examination. Pulses 2+ and equal bilaterally.  Neurological: She is alert and oriented to person, place, and time.  Skin: Skin is warm and dry. No rash noted.  Psychiatric: Affect normal.  Vitals reviewed.  Assessment/Plan: Thyroid activity  decreased Due for repeat TSH level. Will check today. Is taking medication as directed.  Thrombocytopenia ([place]) Repeat CBC today to reassess.  Essential hypertension BP stable. Asymptomatic. Continue current regimen. Labs today.  Diabetes mellitus, type II, insulin dependent ([place]) Repeat labs today. Will alter regimen accordingly. Follow-up with Nephrology as scheduled. Up-to-date on immunization, foot examination and eye examination. Will obtain records from recent diabetic eye examination. Continue efforts on weight loss.  CKD (chronic kidney disease) Followed by Neprhology. Recently started on Losartan BP stable. Will check CMP today.  Calf swelling Chronic. Slight tenderness in the area without sign of superficial clot, thrombophlebitis or cellulitis. Will check [place] today to r/o DVT.    [name], PA-C

## 2016-08-17 NOTE — Patient Instructions (Addendum)
Please go to the lab for blood work. I will call you with your results.  Continue current medication regimen for now.  Will alter based on results.   Please follow-up with Nephrology and Pulmonology as scheduled.  Stop by the front desk to speak with Levada Dy about your Ultrasound. You will also be contacted for mammogram.

## 2016-08-17 NOTE — Progress Notes (Signed)
Pre visit review using our clinic review tool, if applicable. No additional management support is needed unless otherwise documented below in the visit note. 

## 2016-08-18 ENCOUNTER — Ambulatory Visit (HOSPITAL_BASED_OUTPATIENT_CLINIC_OR_DEPARTMENT_OTHER)
Admission: RE | Admit: 2016-08-18 | Discharge: 2016-08-18 | Disposition: A | Discharge status: Home / Self Care | Payer: Medicare Other | Source: Ambulatory Visit | Attending: Physician Assistant | Admitting: Physician Assistant

## 2016-08-18 DIAGNOSIS — M79662 Pain in left lower leg: Secondary | ICD-10-CM | POA: Diagnosis not present

## 2016-08-18 DIAGNOSIS — M7989 Other specified soft tissue disorders: Secondary | ICD-10-CM | POA: Diagnosis not present

## 2016-08-18 DIAGNOSIS — M79605 Pain in left leg: Secondary | ICD-10-CM | POA: Diagnosis not present

## 2016-08-24 ENCOUNTER — Other Ambulatory Visit: Payer: Self-pay | Admitting: Physician Assistant

## 2016-08-24 DIAGNOSIS — E039 Hypothyroidism, unspecified: Secondary | ICD-10-CM

## 2016-08-24 MED ORDER — LEVOTHYROXINE SODIUM 88 MCG PO TABS: 88.0000 ug | ORAL_TABLET | Freq: Every day | ORAL | 1 refills | Status: DC

## 2016-08-25 DIAGNOSIS — M7989 Other specified soft tissue disorders: Secondary | ICD-10-CM | POA: Insufficient documentation

## 2016-08-25 NOTE — Assessment & Plan Note (Signed)
BP stable. Asymptomatic. Continue current regimen. Labs today.

## 2016-08-25 NOTE — Assessment & Plan Note (Addendum)
Repeat labs today. Will alter regimen accordingly. Follow-up with Nephrology as scheduled. Up-to-date on immunization, foot examination and eye examination. Will obtain records from recent diabetic eye examination. Continue efforts on weight loss.

## 2016-08-25 NOTE — Assessment & Plan Note (Signed)
Repeat CBC today to reassess.

## 2016-08-25 NOTE — Assessment & Plan Note (Signed)
Chronic. Slight tenderness in the area without sign of superficial clot, thrombophlebitis or cellulitis. Will check [place] today to r/o DVT.

## 2016-08-25 NOTE — Assessment & Plan Note (Signed)
Due for repeat TSH level. Will check today. Is taking medication as directed.

## 2016-08-25 NOTE — Assessment & Plan Note (Signed)
Followed by Neprhology. Recently started on Losartan BP stable. Will check CMP today.

## 2016-08-31 ENCOUNTER — Other Ambulatory Visit: Payer: Self-pay | Admitting: Physician Assistant

## 2016-08-31 DIAGNOSIS — Z794 Long term (current) use of insulin: Principal | ICD-10-CM

## 2016-08-31 DIAGNOSIS — J449 Chronic obstructive pulmonary disease, unspecified: Secondary | ICD-10-CM | POA: Diagnosis not present

## 2016-08-31 DIAGNOSIS — E119 Type 2 diabetes mellitus without complications: Secondary | ICD-10-CM

## 2016-09-08 DIAGNOSIS — M542 Cervicalgia: Secondary | ICD-10-CM | POA: Diagnosis not present

## 2016-09-08 DIAGNOSIS — G894 Chronic pain syndrome: Secondary | ICD-10-CM | POA: Diagnosis not present

## 2016-09-08 DIAGNOSIS — Z79899 Other long term (current) drug therapy: Secondary | ICD-10-CM | POA: Diagnosis not present

## 2016-09-08 DIAGNOSIS — Z79891 Long term (current) use of opiate analgesic: Secondary | ICD-10-CM | POA: Diagnosis not present

## 2016-09-08 DIAGNOSIS — M545 Low back pain: Secondary | ICD-10-CM | POA: Diagnosis not present

## 2016-09-08 DIAGNOSIS — M5412 Radiculopathy, cervical region: Secondary | ICD-10-CM | POA: Diagnosis not present

## 2016-09-10 ENCOUNTER — Encounter: Payer: Self-pay | Admitting: Physician Assistant

## 2016-09-10 ENCOUNTER — Ambulatory Visit (INDEPENDENT_AMBULATORY_CARE_PROVIDER_SITE_OTHER): Payer: Medicare Other | Admitting: Physician Assistant

## 2016-09-10 VITALS — BP 122/62 | HR 91 | Temp 98.1°F | Resp 16 | Ht 64.0 in | Wt 273.0 lb

## 2016-09-10 DIAGNOSIS — L03116 Cellulitis of left lower limb: Secondary | ICD-10-CM | POA: Diagnosis not present

## 2016-09-10 MED ORDER — CEPHALEXIN 500 MG PO CAPS: 500.0000 mg | ORAL_CAPSULE | Freq: Two times a day (BID) | ORAL | 0 refills | Status: AC

## 2016-09-10 NOTE — Patient Instructions (Signed)
Please take the antibiotic as directed. Keep the leg elevated while resting. Take an extra 40 of lasix this evening, then take 20 mg twice daily for the next 2 days, before resuming regular regimen. Increase your potassium intake over the next couple of days.  Follow-up with me on Monday for reassessment. If anything is worsening, please go to the ER.   Cellulitis, Adult Cellulitis is a skin infection. The infected area is usually red and sore. This condition occurs most often in the arms and lower legs. It is very important to get treated for this condition. Follow these instructions at home:  Take over-the-counter and prescription medicines only as told by your doctor.  If you were prescribed an antibiotic medicine, take it as told by your doctor. Do not stop taking the antibiotic even if you start to feel better.  Drink enough fluid to keep your pee (urine) clear or pale yellow.  Do not touch or rub the infected area.  Raise (elevate) the infected area above the level of your heart while you are sitting or lying down.  Place warm or cold wet cloths (warm or cold compresses) on the infected area. Do this as told by your doctor.  Keep all follow-up visits as told by your doctor. This is important. These visits let your doctor make sure your infection is not getting worse. Contact a doctor if:  You have a fever.  Your symptoms do not get better after 1-2 days of treatment.  Your bone or joint under the infected area starts to hurt after the skin has healed.  Your infection comes back. This can happen in the same area or another area.  You have a swollen bump in the infected area.  You have new symptoms.  You feel ill and also have muscle aches and pains. Get help right away if:  Your symptoms get worse.  You feel very sleepy.  You throw up (vomit) or have watery poop (diarrhea) for a long time.  There are red streaks coming from the infected area.  Your red area  gets larger.  Your red area turns darker. This information is not intended to replace advice given to you by your health care provider. Make sure you discuss any questions you have with your health care provider. Document Released: 10/20/2007 Document Revised: 10/09/2015 Document Reviewed: 03/12/2015 Elsevier Interactive Patient Education  2017 [employer].

## 2016-09-10 NOTE — Progress Notes (Signed)
Patient presents to clinic today c/o 3 days of increased peripheral edema in bilateral extremities with L > R associated with significant erythema, tenderness and warmth. Denies fever, chills, nausea or vomiting. Denies drainage from extremity. Patient with chronic peripheral edema and venous insufficiency. Recently with negative [place] of LLE. Is taking lasix daily as directed. Denies PND or orthopnea.  Past Medical History:  Diagnosis Date  . Abdominal mass of other site   . Cervical compression fracture ([place])   . Chronic kidney disease, stage 3    Borderline Stage 2-3  . Chronic lower limb pain   . COPD (chronic obstructive pulmonary disease) ([place])   . CTS (carpal tunnel syndrome)   . Depression   . Diabetes ([place])    Type II  . Fibromyalgia   . Hepatitis C   . Hypercholesteremia   . Hypertension   . Hypothyroidism   . Morbid obesity ([place])   . Neuropathy    Diabetes  . OSA (obstructive sleep apnea)   . Osteoarthritis   . Renal cancer ([place])    Left Kidney Removed  . RLS (restless legs syndrome)   . Syncope   . Venous stasis     Current Outpatient Prescriptions on File Prior to Visit  Medication Sig Dispense Refill  . albuterol (PROVENTIL HFA;VENTOLIN HFA) 108 (90 Base) MCG/ACT inhaler Inhale 1-2 puffs into the lungs every 6 (six) hours as needed for wheezing or shortness of breath. 6.7 g 5  . Fluticasone-Salmeterol (ADVAIR DISKUS) 500-50 MCG/DOSE AEPB Inhale 1 puff into the lungs once. 60 each 5  . furosemide (LASIX) 20 MG tablet Take 1 tablet (20 mg total) by mouth daily as needed. (Patient taking differently: Take 20 mg by mouth daily. ) 30 tablet 0  . insulin lispro (HUMALOG) 100 UNIT/ML injection Inject 0.15 mLs (15 Units total) into the skin 3 (three) times daily as needed for high blood sugar. Dx: E11.9 10 mL 5  . ipratropium-albuterol (DUONEB) 0.5-2.5 (3) MG/3ML SOLN Take 3 mLs by nebulization 2 (two) times daily.    [name] Kitchen LANTUS 100 UNIT/ML injection INJECT 0.3MLS (30 UNITS  TOTAL) INTO THE SKIN EVERY DAY 30 mL 3  . levothyroxine (SYNTHROID, LEVOTHROID) 88 MCG tablet Take 1 tablet (88 mcg total) by mouth daily before breakfast. 30 tablet 1  . losartan (COZAAR) 25 MG tablet Take 1 tablet by mouth daily.    . Morphine-Naltrexone (EMBEDA) 50-2 MG CPCR Take 1 capsule by mouth daily.    . nortriptyline (PAMELOR) 25 MG capsule TAKE 1 CAPSULE BY MOUTH AT BEDTIME 30 capsule 3  . nortriptyline (PAMELOR) 75 MG capsule TAKE 1 CAPSULE BY MOUTH AT BEDTIME 30 capsule 3  . nystatin (NYSTATIN) powder Apply 1 Bottle topically 2 (two) times daily as needed (yeast infection). 15 g 2  . pravastatin (PRAVACHOL) 20 MG tablet Take 1 tablet (20 mg total) by mouth daily. 90 tablet 1  . sitaGLIPtin (JANUVIA) 50 MG tablet Take 1 tablet (50 mg total) by mouth daily. 60 tablet 2  . SPIRIVA HANDIHALER 18 MCG inhalation capsule PLACE 1 CAPSULE INTO THE INHALER AND INHALE DAILY 30 capsule 6  . SURE COMFORT INSULIN SYRINGE 30G X 1/2" 1 ML MISC USE FOUR (4) TIMES DAILY AS DIRECTED 100 each 3  . tiZANidine (ZANAFLEX) 2 MG tablet Take 2-4 mg by mouth every 12 (twelve) hours as needed for muscle spasms.     No current facility-administered medications on file prior to visit.     Allergies  Allergen Reactions  .  Gabapentin Anaphylaxis  . Lyrica [Pregabalin] Shortness Of Breath    Trouble breathing  . Ketorolac Tromethamine Hives  . Lisinopril Cough    Family History  Problem Relation Age of Onset  . Heart attack Mother 60    Deceased  . Heart disease Mother   . Emphysema Mother   . Alcoholism Mother   . COPD Father 57    Deceased  . Emphysema Father   . Alcoholism Father   . Esophageal varices Father   . Alcoholism Paternal Grandfather   . Diabetes Maternal Grandmother   . Heart disease Maternal Grandmother   . Lung cancer Maternal Grandfather   . Emphysema Maternal Grandfather   . Brain cancer Maternal Aunt   . Diabetes Sister   . Heart defect Sister   . Obesity Son   . Breast  cancer Maternal Aunt     Social History   Social History  . Marital status: Single    Spouse name: N/A  . Number of children: N/A  . Years of education: N/A   Social History Main Topics  . Smoking status: Former Smoker    Packs/day: 2.50    Years: 45.00    Types: Cigarettes    Quit date: 07/21/2016  . Smokeless tobacco: Never Used  . Alcohol use No  . Drug use: No  . Sexual activity: No   Other Topics Concern  . None   Social History Narrative  . None    Review of Systems - See HPI.  All other ROS are negative.  BP 122/62   Pulse 91   Temp 98.1 F (36.7 C) (Oral)   Resp 16   Ht _0  (1.626 m)   Wt 273 lb (123.8 kg)   SpO2 92% Comment: 2L  BMI 46.86 kg/m   Physical Exam  Constitutional: She is oriented to person, place, and time and well-developed, well-nourished, and in no distress.  HENT:  Head: Normocephalic and atraumatic.  Eyes: Conjunctivae are normal.  Neck: Neck supple.  Cardiovascular: Normal rate, regular rhythm, normal heart sounds and intact distal pulses.   Pulses:      Dorsalis pedis pulses are 2+ on the right side, and 2+ on the left side.       Posterior tibial pulses are 2+ on the right side, and 2+ on the left side.  Pulmonary/Chest: Effort normal and breath sounds normal. No respiratory distress. She has no wheezes. She has no rales. She exhibits no tenderness.  Neurological: She is alert and oriented to person, place, and time.  Skin: Skin is warm and dry.     Vitals reviewed.   Recent Results (from the past 2160 hour(s))  CBC     Status: Abnormal   Collection Time: 08/17/16  1:40 PM  Result Value Ref Range   WBC 6.4 4.0 - 10.5 K/uL   RBC 4.78 3.87 - 5.11 Mil/uL   Platelets 106.0 (L) 150.0 - 400.0 K/uL   Hemoglobin 13.1 12.0 - 15.0 g/dL   HCT 40.5 36.0 - 46.0 %   MCV 84.7 78.0 - 100.0 fl   MCHC 32.4 30.0 - 36.0 g/dL   RDW 18.6 (H) 11.5 - 15.5 %  Comp Met (CMET)     Status: Abnormal   Collection Time: 08/17/16  1:40 PM  Result  Value Ref Range   Sodium 139 135 - 145 mEq/L   Potassium 4.1 3.5 - 5.1 mEq/L   Chloride 98 96 - 112 mEq/L   CO2 37 (H) 19 -  32 mEq/L   Glucose, Bld 54 (L) 70 - 99 mg/dL   BUN 17 6 - 23 mg/dL   Creatinine, Ser 1.13 0.40 - 1.20 mg/dL   Total Bilirubin 0.3 0.2 - 1.2 mg/dL   Alkaline Phosphatase 76 39 - 117 U/L   AST 15 0 - 37 U/L   ALT 10 0 - 35 U/L   Total Protein 6.9 6.0 - 8.3 g/dL   Albumin 3.8 3.5 - 5.2 g/dL   Calcium 9.0 8.4 - 10.5 mg/dL   GFR 51.68 (L) >60.00 mL/min  TSH     Status: Abnormal   Collection Time: 08/17/16  1:40 PM  Result Value Ref Range   TSH 6.26 (H) 0.35 - 4.50 uIU/mL  Hemoglobin A1c     Status: None   Collection Time: 08/17/16  1:40 PM  Result Value Ref Range   Hgb A1c MFr Bld 5.7 4.6 - 6.5 %    Comment: Glycemic Control Guidelines for People with Diabetes:Non Diabetic:  <6%Goal of Therapy: <7%Additional Action Suggested:  >8%   Urinalysis, Routine w reflex microscopic     Status: None   Collection Time: 08/17/16  1:40 PM  Result Value Ref Range   Color, Urine YELLOW Yellow;Lt. Yellow   APPearance CLEAR Clear   Specific Gravity, Urine 1.010 1.000 - 1.030   pH 7.0 5.0 - 8.0   Total Protein, Urine NEGATIVE Negative   Urine Glucose NEGATIVE Negative   Ketones, ur NEGATIVE Negative   Bilirubin Urine NEGATIVE Negative   Hgb urine dipstick NEGATIVE Negative   Urobilinogen, UA 1.0 0.0 - 1.0   Leukocytes, UA NEGATIVE Negative   Nitrite NEGATIVE Negative   WBC, UA none seen 0-2/hpf   RBC / HPF none seen 0-2/hpf   Squamous Epithelial / LPF Rare(0-4/hpf) Rare(0-4/hpf)    Assessment/Plan: 1. Cellulitis of left lower extremity Rx Keflex BID. Supportive measures and OTC medications reviewed. Increase Lasix to 20 AM and 40 PM today, 20 mg BID x 2 days, then resuming original dosing. She is to increase potassium intake. FU scheduled for Monday. Alarm signs/symptoms discussed with patient that would prompt ER assessment.  - cephALEXin (KEFLEX) 500 MG capsule; Take  1 capsule (500 mg total) by mouth 2 (two) times daily.  Dispense: 14 capsule; Refill: 0   [name], [place]

## 2016-09-10 NOTE — Progress Notes (Signed)
Pre visit review using our clinic review tool, if applicable. No additional management support is needed unless otherwise documented below in the visit note. 

## 2016-09-28 ENCOUNTER — Other Ambulatory Visit (INDEPENDENT_AMBULATORY_CARE_PROVIDER_SITE_OTHER): Payer: Medicare Other

## 2016-09-28 DIAGNOSIS — E039 Hypothyroidism, unspecified: Secondary | ICD-10-CM

## 2016-09-28 LAB — TSH: TSH: 2.39 u[IU]/mL (ref 0.35–4.50)

## 2016-09-30 DIAGNOSIS — J449 Chronic obstructive pulmonary disease, unspecified: Secondary | ICD-10-CM | POA: Diagnosis not present

## 2016-10-05 ENCOUNTER — Other Ambulatory Visit: Payer: Medicare Other

## 2016-10-06 DIAGNOSIS — G894 Chronic pain syndrome: Secondary | ICD-10-CM | POA: Diagnosis not present

## 2016-10-06 DIAGNOSIS — M542 Cervicalgia: Secondary | ICD-10-CM | POA: Diagnosis not present

## 2016-10-06 DIAGNOSIS — M5412 Radiculopathy, cervical region: Secondary | ICD-10-CM | POA: Diagnosis not present

## 2016-10-06 DIAGNOSIS — Z79899 Other long term (current) drug therapy: Secondary | ICD-10-CM | POA: Diagnosis not present

## 2016-10-06 DIAGNOSIS — M549 Dorsalgia, unspecified: Secondary | ICD-10-CM | POA: Diagnosis not present

## 2016-10-06 DIAGNOSIS — Z79891 Long term (current) use of opiate analgesic: Secondary | ICD-10-CM | POA: Diagnosis not present

## 2016-11-11 ENCOUNTER — Other Ambulatory Visit: Payer: Self-pay | Admitting: Physician Assistant

## 2016-11-11 DIAGNOSIS — G8929 Other chronic pain: Secondary | ICD-10-CM

## 2016-11-18 ENCOUNTER — Other Ambulatory Visit: Payer: Self-pay | Admitting: Physician Assistant

## 2016-11-18 DIAGNOSIS — Z1231 Encounter for screening mammogram for malignant neoplasm of breast: Secondary | ICD-10-CM

## 2016-11-23 ENCOUNTER — Encounter (HOSPITAL_BASED_OUTPATIENT_CLINIC_OR_DEPARTMENT_OTHER): Payer: Self-pay

## 2016-11-23 ENCOUNTER — Ambulatory Visit (HOSPITAL_BASED_OUTPATIENT_CLINIC_OR_DEPARTMENT_OTHER)
Admission: RE | Admit: 2016-11-23 | Discharge: 2016-11-23 | Disposition: A | Discharge status: Home / Self Care | Payer: Medicare Other | Source: Ambulatory Visit | Attending: Physician Assistant | Admitting: Physician Assistant

## 2016-11-23 DIAGNOSIS — Z1231 Encounter for screening mammogram for malignant neoplasm of breast: Secondary | ICD-10-CM | POA: Diagnosis not present

## 2016-11-24 ENCOUNTER — Other Ambulatory Visit: Payer: Self-pay | Admitting: Physician Assistant

## 2016-11-24 DIAGNOSIS — R928 Other abnormal and inconclusive findings on diagnostic imaging of breast: Secondary | ICD-10-CM

## 2016-12-01 ENCOUNTER — Other Ambulatory Visit: Payer: Medicare Other

## 2016-12-16 ENCOUNTER — Ambulatory Visit
Admission: RE | Admit: 2016-12-16 | Discharge: 2016-12-16 | Disposition: A | Discharge status: Home / Self Care | Payer: Medicare Other | Source: Ambulatory Visit | Attending: Physician Assistant | Admitting: Physician Assistant

## 2016-12-16 DIAGNOSIS — R928 Other abnormal and inconclusive findings on diagnostic imaging of breast: Secondary | ICD-10-CM

## 2016-12-28 ENCOUNTER — Other Ambulatory Visit: Payer: Self-pay | Admitting: Physician Assistant

## 2016-12-28 DIAGNOSIS — E119 Type 2 diabetes mellitus without complications: Secondary | ICD-10-CM

## 2016-12-28 DIAGNOSIS — Z794 Long term (current) use of insulin: Principal | ICD-10-CM

## 2016-12-29 ENCOUNTER — Other Ambulatory Visit: Payer: Self-pay | Admitting: Physician Assistant

## 2016-12-29 DIAGNOSIS — E785 Hyperlipidemia, unspecified: Secondary | ICD-10-CM

## 2016-12-29 NOTE — Telephone Encounter (Signed)
Patient is due for an follow up appointment-fasting

## 2016-12-29 NOTE — Telephone Encounter (Signed)
Appointment scheduled for 12/31/16

## 2016-12-29 NOTE — Telephone Encounter (Signed)
Scheduled follow up appt.

## 2016-12-31 ENCOUNTER — Telehealth: Payer: Self-pay | Admitting: *Deleted

## 2016-12-31 ENCOUNTER — Ambulatory Visit (INDEPENDENT_AMBULATORY_CARE_PROVIDER_SITE_OTHER): Payer: Medicare Other | Admitting: Physician Assistant

## 2016-12-31 ENCOUNTER — Encounter: Payer: Self-pay | Admitting: Physician Assistant

## 2016-12-31 VITALS — BP 100/70 | HR 83 | Temp 97.8°F | Resp 14 | Ht 64.0 in | Wt 266.0 lb

## 2016-12-31 DIAGNOSIS — E785 Hyperlipidemia, unspecified: Secondary | ICD-10-CM

## 2016-12-31 DIAGNOSIS — E119 Type 2 diabetes mellitus without complications: Secondary | ICD-10-CM | POA: Diagnosis not present

## 2016-12-31 DIAGNOSIS — I10 Essential (primary) hypertension: Secondary | ICD-10-CM | POA: Diagnosis not present

## 2016-12-31 DIAGNOSIS — Z794 Long term (current) use of insulin: Secondary | ICD-10-CM | POA: Diagnosis not present

## 2016-12-31 LAB — COMPREHENSIVE METABOLIC PANEL
ALBUMIN: 3.5 g/dL (ref 3.5–5.2)
ALT: 10 U/L (ref 0–35)
AST: 14 U/L (ref 0–37)
Alkaline Phosphatase: 67 U/L (ref 39–117)
BUN: 22 mg/dL (ref 6–23)
CALCIUM: 8.7 mg/dL (ref 8.4–10.5)
CHLORIDE: 99 meq/L (ref 96–112)
CO2: 38 mEq/L — ABNORMAL HIGH (ref 19–32)
Creatinine, Ser: 1.18 mg/dL (ref 0.40–1.20)
GFR: 49.1 mL/min — AB (ref >60.00)
Glucose, Bld: 42 mg/dL — CL (ref 70–99)
POTASSIUM: 4.5 meq/L (ref 3.5–5.1)
Sodium: 140 mEq/L (ref 135–145)
Total Bilirubin: 0.4 mg/dL (ref 0.2–1.2)
Total Protein: 6.3 g/dL (ref 6.0–8.3)

## 2016-12-31 LAB — LIPID PANEL
CHOLESTEROL: 105 mg/dL (ref 0–200)
HDL: 40.4 mg/dL (ref >39.00)
LDL CALC: 49 mg/dL (ref 0–99)
NonHDL: 64.1
TRIGLYCERIDES: 75 mg/dL (ref 0.0–149.0)
Total CHOL/HDL Ratio: 3
VLDL: 15 mg/dL (ref 0.0–40.0)

## 2016-12-31 MED ORDER — VARENICLINE TARTRATE 1 MG PO TABS: 1.0000 mg | ORAL_TABLET | Freq: Two times a day (BID) | ORAL | 0 refills | Status: DC

## 2016-12-31 MED ORDER — ZOLPIDEM TARTRATE 5 MG PO TABS: 5.0000 mg | ORAL_TABLET | Freq: Every evening | ORAL | 1 refills | Status: DC | PRN

## 2016-12-31 NOTE — Telephone Encounter (Signed)
Patient advised of PCP's notes. Stated verbal understanding.  She will call on Monday with weekend levels.

## 2016-12-31 NOTE — Progress Notes (Signed)
History of Present Illness: Patient is a 61 y.o. female who presents to clinic today for follow-up of Diabetes Mellitus II, previously well-controlled.  Patient currently on medication regimen of Lantus 32 units daily, Humalog per SSI.  Is taking medications as directed. Endorses fasting sugars are averaging 90-120.  Denies vision changes, increased neuropathic pains.  Patient has been working hard on diet and exercise, noting weight loss. Denies polyuria, polydipsia or polyphagia.   Latest Maintenance: A1C --  Lab Results  Component Value Date   HGBA1C 5.7 08/17/2016   Diabetic Eye Exam -- Patient has scheduled.   Urine Microalbumin -- On arb.  Foot Exam -- Foot exam up-to-date.   Past Medical History:  Diagnosis Date  . Abdominal mass of other site   . Cervical compression fracture ([place])   . Chronic kidney disease, stage 3    Borderline Stage 2-3  . Chronic lower limb pain   . COPD (chronic obstructive pulmonary disease) ([place])   . CTS (carpal tunnel syndrome)   . Depression   . Diabetes ([place])    Type II  . Fibromyalgia   . Hepatitis C   . Hypercholesteremia   . Hypertension   . Hypothyroidism   . Morbid obesity ([place])   . Neuropathy    Diabetes  . OSA (obstructive sleep apnea)   . Osteoarthritis   . Renal cancer ([place])    Left Kidney Removed  . RLS (restless legs syndrome)   . Syncope   . Venous stasis     Current Outpatient Prescriptions on File Prior to Visit  Medication Sig Dispense Refill  . albuterol (PROVENTIL HFA;VENTOLIN HFA) 108 (90 Base) MCG/ACT inhaler Inhale 1-2 puffs into the lungs every 6 (six) hours as needed for wheezing or shortness of breath. 6.7 g 5  . Fluticasone-Salmeterol (ADVAIR DISKUS) 500-50 MCG/DOSE AEPB Inhale 1 puff into the lungs once. 60 each 5  . furosemide (LASIX) 20 MG tablet Take 1 tablet (20 mg total) by mouth daily as needed. (Patient taking differently: Take 20 mg by mouth daily. ) 30 tablet 0  . HUMALOG 100 UNIT/ML injection  INJECT 0.15 MILLILITERS (15 UNITS) INTO THE SKIN 3 TIMES DAILY AS NEEDED FOR HIGH BLOOD SUGAR 10 mL 2  . ipratropium-albuterol (DUONEB) 0.5-2.5 (3) MG/3ML SOLN Take 3 mLs by nebulization 2 (two) times daily.    [name] Kitchen LANTUS 100 UNIT/ML injection INJECT 0.3MLS (30 UNITS TOTAL) INTO THE SKIN EVERY DAY 30 mL 3  . levothyroxine (SYNTHROID, LEVOTHROID) 88 MCG tablet TAKE ONE TABLET BY MOUTH EVERY DAY BEFORE BREAKFAST 90 tablet 1  . losartan (COZAAR) 25 MG tablet Take 1 tablet by mouth daily.    . nortriptyline (PAMELOR) 25 MG capsule TAKE 1 CAPSULE BY MOUTH AT BEDTIME 30 capsule 5  . nortriptyline (PAMELOR) 75 MG capsule TAKE 1 CAPSULE BY MOUTH AT BEDTIME 30 capsule 5  . nystatin (NYSTATIN) powder Apply 1 Bottle topically 2 (two) times daily as needed (yeast infection). 15 g 2  . pravastatin (PRAVACHOL) 20 MG tablet TAKE ONE TABLET BY MOUTH EVERY DAY 90 tablet 0  . SPIRIVA HANDIHALER 18 MCG inhalation capsule PLACE 1 CAPSULE INTO THE INHALER AND INHALE DAILY 30 capsule 6  . SURE COMFORT INSULIN SYRINGE 30G X 1/2" 1 ML MISC USE FOUR (4) TIMES DAILY AS DIRECTED 100 each 3  . tiZANidine (ZANAFLEX) 2 MG tablet Take 2-4 mg by mouth every 12 (twelve) hours as needed for muscle spasms.     No current facility-administered medications on  file prior to visit.     Allergies  Allergen Reactions  . Gabapentin Anaphylaxis  . Lyrica [Pregabalin] Shortness Of Breath    Trouble breathing  . Ketorolac Tromethamine Hives  . Lisinopril Cough    Family History  Problem Relation Age of Onset  . Heart attack Mother 5       Deceased  . Heart disease Mother   . Emphysema Mother   . Alcoholism Mother   . COPD Father 68       Deceased  . Emphysema Father   . Alcoholism Father   . Esophageal varices Father   . Alcoholism Paternal Grandfather   . Diabetes Maternal Grandmother   . Heart disease Maternal Grandmother   . Lung cancer Maternal Grandfather   . Emphysema Maternal Grandfather   . Brain cancer Maternal  Aunt   . Diabetes Sister   . Heart defect Sister   . Obesity Son   . Breast cancer Maternal Aunt     Social History   Social History  . Marital status: Single    Spouse name: N/A  . Number of children: N/A  . Years of education: N/A   Social History Main Topics  . Smoking status: Former Smoker    Packs/day: 2.50    Years: 45.00    Types: Cigarettes    Quit date: 07/21/2016  . Smokeless tobacco: Never Used  . Alcohol use No  . Drug use: No  . Sexual activity: No   Other Topics Concern  . None   Social History Narrative  . None    Review of Systems: Pertinent ROS are listed in HPI  Physical Examination: BP 100/70   Pulse 83   Temp 97.8 F (36.6 C) (Oral)   Resp 14   Ht 5\' 4"  (1.626 m)   Wt 266 lb (120.7 kg)   SpO2 91%   BMI 45.66 kg/m  General appearance: alert, cooperative, appears stated age and no distress Lungs: clear to auscultation bilaterally Heart: regular rate and rhythm, S1, S2 normal, no murmur, click, rub or gallop Extremities: no ulcers, gangrene or trophic changes and venous stasis dermatitis noted Pulses: 2+ and symmetric Neurologic: Alert and oriented X 3, normal strength and tone. Normal symmetric reflexes. Normal coordination and gait  Assessment/Plan: Essential hypertension BP stable. Asymptomatic. Repeat labs today.  Diabetes mellitus, type II, insulin dependent ([place]) Repeat labs today. Foot exam up-to-date. Eye exam scheduled. Immunizations up-to-date. Continue current regimen. Will alter based on results.   Hyperlipidemia Repeat labs today.

## 2016-12-31 NOTE — Patient Instructions (Signed)
Please go to the lab for blood work. I will call you with your results.  Continue medications as directed. I am sending in a refill of the Chantix. I am also given you a small supply of Ambien. We will work on [employer] prescription as well.  Follow-up 3 months.

## 2016-12-31 NOTE — Progress Notes (Signed)
Pre visit review using our clinic review tool, if applicable. No additional management support is needed unless otherwise documented below in the visit note. 

## 2016-12-31 NOTE — Telephone Encounter (Signed)
Noted. Keep check on sugars over the weekend and call with levels on Monday. Would have her decrease long-acting insulin by 3 units.

## 2016-12-31 NOTE — Telephone Encounter (Signed)
Lab called with a critical glucose of 42 from this morning. Spoke with PCP and he asked that I call patient to see how she was feeling, and have her check her sugar while she was on the phone so that we can determine if we need to adjust any medication.  Patient states that she had not eaten supper last night, and she had not had breakfast this morning when she came in.  Patient has had lunch and sugar is currently 123. She is feeling fine.

## 2017-01-03 NOTE — Telephone Encounter (Signed)
Patient was informed of information.   Stated verbal understanding.

## 2017-01-03 NOTE — Telephone Encounter (Signed)
Much better. Continue current regimen.

## 2017-01-03 NOTE — Telephone Encounter (Signed)
Blood Sugar Readings -   All readings were after patient decreased insulin by three units per notes below.  ALSO - Patient did not take any Novalog all weekend.  She states she has that one usually only if it is over 200.   Friday 12/31/16   Lunch:  123 Dinner: 115 Bedtime: 126  Saturday 01/01/17 Morning: 100 Breakfast: 102 Lunch: 85 Dinner: 97 Bedtime: 131  Sunday  01/02/17 Morning: 92 Breakfast: 110 Lunch: 90 Dinner: 99 Bedtime: 101  01/03/17 Morning: 100 Breakfast: 111

## 2017-01-09 NOTE — Assessment & Plan Note (Signed)
BP stable. Asymptomatic. Repeat labs today.

## 2017-01-09 NOTE — Assessment & Plan Note (Signed)
Repeat labs today

## 2017-01-09 NOTE — Assessment & Plan Note (Signed)
Repeat labs today. Foot exam up-to-date. Eye exam scheduled. Immunizations up-to-date. Continue current regimen. Will alter based on results.

## 2017-01-11 ENCOUNTER — Other Ambulatory Visit: Payer: Self-pay | Admitting: Physician Assistant

## 2017-01-11 DIAGNOSIS — E785 Hyperlipidemia, unspecified: Secondary | ICD-10-CM

## 2017-01-21 ENCOUNTER — Other Ambulatory Visit: Payer: Self-pay | Admitting: Physician Assistant

## 2017-03-08 ENCOUNTER — Ambulatory Visit (HOSPITAL_BASED_OUTPATIENT_CLINIC_OR_DEPARTMENT_OTHER)
Admission: RE | Admit: 2017-03-08 | Discharge: 2017-03-08 | Disposition: A | Discharge status: Home / Self Care | Payer: Medicare Other | Source: Ambulatory Visit | Attending: Physician Assistant | Admitting: Physician Assistant

## 2017-03-08 ENCOUNTER — Other Ambulatory Visit (HOSPITAL_BASED_OUTPATIENT_CLINIC_OR_DEPARTMENT_OTHER): Payer: Self-pay | Admitting: Physician Assistant

## 2017-03-08 DIAGNOSIS — M25561 Pain in right knee: Secondary | ICD-10-CM | POA: Insufficient documentation

## 2017-03-08 DIAGNOSIS — M25562 Pain in left knee: Secondary | ICD-10-CM | POA: Diagnosis present

## 2017-03-08 DIAGNOSIS — G894 Chronic pain syndrome: Secondary | ICD-10-CM | POA: Diagnosis not present

## 2017-03-08 DIAGNOSIS — G8929 Other chronic pain: Secondary | ICD-10-CM

## 2017-03-08 DIAGNOSIS — M170 Bilateral primary osteoarthritis of knee: Secondary | ICD-10-CM | POA: Diagnosis not present

## 2017-03-08 DIAGNOSIS — M2391 Unspecified internal derangement of right knee: Secondary | ICD-10-CM | POA: Diagnosis not present

## 2017-03-09 ENCOUNTER — Other Ambulatory Visit: Payer: Self-pay | Admitting: Physician Assistant

## 2017-03-29 ENCOUNTER — Other Ambulatory Visit: Payer: Self-pay | Admitting: Physician Assistant

## 2017-03-29 ENCOUNTER — Ambulatory Visit: Payer: Medicare Other | Admitting: Physician Assistant

## 2017-04-06 ENCOUNTER — Other Ambulatory Visit: Payer: Self-pay | Admitting: Physician Assistant

## 2017-04-06 DIAGNOSIS — G8929 Other chronic pain: Secondary | ICD-10-CM

## 2017-04-06 NOTE — Telephone Encounter (Signed)
Last OV 12/31/16 chantix last filled 01/24/17 #60 with 1

## 2017-04-06 NOTE — Telephone Encounter (Signed)
Last OV 12/31/16  Nortriptyline 25mg  11/12/16 #30 with 5 Nortriptyline 75mg  11/12/16 #30 with 5 Zolpidem last filled 03/09/17 #15 with 0

## 2017-04-13 ENCOUNTER — Ambulatory Visit (INDEPENDENT_AMBULATORY_CARE_PROVIDER_SITE_OTHER): Payer: Medicare Other | Admitting: Physician Assistant

## 2017-04-13 ENCOUNTER — Encounter: Payer: Self-pay | Admitting: Physician Assistant

## 2017-04-13 ENCOUNTER — Other Ambulatory Visit: Payer: Self-pay

## 2017-04-13 ENCOUNTER — Ambulatory Visit (INDEPENDENT_AMBULATORY_CARE_PROVIDER_SITE_OTHER): Payer: Medicare Other

## 2017-04-13 VITALS — BP 98/50 | HR 94 | Temp 97.7°F | Resp 16 | Ht 64.0 in | Wt 279.0 lb

## 2017-04-13 DIAGNOSIS — J208 Acute bronchitis due to other specified organisms: Secondary | ICD-10-CM | POA: Diagnosis not present

## 2017-04-13 DIAGNOSIS — B9689 Other specified bacterial agents as the cause of diseases classified elsewhere: Secondary | ICD-10-CM

## 2017-04-13 DIAGNOSIS — R062 Wheezing: Secondary | ICD-10-CM | POA: Diagnosis not present

## 2017-04-13 MED ORDER — ALBUTEROL SULFATE (2.5 MG/3ML) 0.083% IN NEBU
2.5000 mg | INHALATION_SOLUTION | Freq: Once | RESPIRATORY_TRACT | Status: AC
  Administered 2017-04-13: 2.5 mg via RESPIRATORY_TRACT

## 2017-04-13 MED ORDER — PREDNISONE 20 MG PO TABS: 40.0000 mg | ORAL_TABLET | Freq: Every day | ORAL | 0 refills | Status: DC

## 2017-04-13 MED ORDER — DOXYCYCLINE HYCLATE 100 MG PO CAPS: 100.0000 mg | ORAL_CAPSULE | Freq: Two times a day (BID) | ORAL | 0 refills | Status: DC

## 2017-04-13 NOTE — Patient Instructions (Signed)
Take antibiotic (doxycycline) as directed.  Increase fluids.  Get plenty of rest. Use Mucinex for congestion. Start prednisone as directed. Take a daily probiotic (I recommend Align or Culturelle, but even Activia Yogurt may be beneficial).  A humidifier placed in the bedroom may offer some relief for a dry, scratchy throat of nasal irritation.  Read information below on acute bronchitis. Please call or return to clinic if symptoms are not improving.  Acute Bronchitis Bronchitis is when the airways that extend from the windpipe into the lungs get red, puffy, and painful (inflamed). Bronchitis often causes thick spit (mucus) to develop. This leads to a cough. A cough is the most common symptom of bronchitis. In acute bronchitis, the condition usually begins suddenly and goes away over time (usually in 2 weeks). Smoking, allergies, and asthma can make bronchitis worse. Repeated episodes of bronchitis may cause more lung problems.  HOME CARE  Rest.  Drink enough fluids to keep your pee (urine) clear or pale yellow (unless you need to limit fluids as told by your doctor).  Only take over-the-counter or prescription medicines as told by your doctor.  Avoid smoking and secondhand smoke. These can make bronchitis worse. If you are a smoker, think about using nicotine gum or skin patches. Quitting smoking will help your lungs heal faster.  Reduce the chance of getting bronchitis again by:  Washing your hands often.  Avoiding people with cold symptoms.  Trying not to touch your hands to your mouth, nose, or eyes.  Follow up with your doctor as told.  GET HELP IF: Your symptoms do not improve after 1 week of treatment. Symptoms include:  Cough.  Fever.  Coughing up thick spit.  Body aches.  Chest congestion.  Chills.  Shortness of breath.  Sore throat.  GET HELP RIGHT AWAY IF:   You have an increased fever.  You have chills.  You have severe shortness of breath.  You have  bloody thick spit (sputum).  You throw up (vomit) often.  You lose too much body fluid (dehydration).  You have a severe headache.  You faint.  MAKE SURE YOU:   Understand these instructions.  Will watch your condition.  Will get help right away if you are not doing well or get worse. Document Released: 10/20/2007 Document Revised: 01/03/2013 Document Reviewed: 10/24/2012 [facility] Patient Information 2015 [place], [place]. This information is not intended to replace advice given to you by your health care provider. Make sure you discuss any questions you have with your health care provider.

## 2017-04-13 NOTE — Progress Notes (Signed)
Patient presents to clinic today c/o 1.5 weeks of chest congestion with cough productive of sputum, fatigue, wheezing increased from baseline. Notes low-grade fever starting yesterday. Denies chest pain. Is keeping good O2 sats at home with her oxygen. Denies recent travel. Is taking respiratory medications as directed.   Past Medical History:  Diagnosis Date  . Abdominal mass of other site   . Cervical compression fracture ([place])   . Chronic kidney disease, stage 3 (HCC)    Borderline Stage 2-3  . Chronic lower limb pain   . COPD (chronic obstructive pulmonary disease) ([place])   . CTS (carpal tunnel syndrome)   . Depression   . Diabetes ([place])    Type II  . Fibromyalgia   . Hepatitis C   . Hypercholesteremia   . Hypertension   . Hypothyroidism   . Morbid obesity ([place])   . Neuropathy    Diabetes  . OSA (obstructive sleep apnea)   . Osteoarthritis   . Renal cancer ([place])    Left Kidney Removed  . RLS (restless legs syndrome)   . Syncope   . Venous stasis     Current Outpatient Medications on File Prior to Visit  Medication Sig Dispense Refill  . ADVAIR DISKUS 500-50 MCG/DOSE AEPB INHALE 1 PUFF INTO THE LUNGS ONCE 60 each 1  . albuterol (PROVENTIL HFA;VENTOLIN HFA) 108 (90 Base) MCG/ACT inhaler Inhale 1-2 puffs into the lungs every 6 (six) hours as needed for wheezing or shortness of breath. 6.7 g 5  . CHANTIX 1 MG tablet TAKE ONE TABLET BY MOUTH TWICE DAILY 60 tablet 0  . EMBEDA 30-1.2 MG CPCR Take 1 capsule by mouth twice daily    . furosemide (LASIX) 20 MG tablet Take 1 tablet (20 mg total) by mouth daily as needed. (Patient taking differently: Take 20 mg by mouth daily. ) 30 tablet 0  . HUMALOG 100 UNIT/ML injection INJECT 0.15 MILLILITERS (15 UNITS) INTO THE SKIN 3 TIMES DAILY AS NEEDED FOR HIGH BLOOD SUGAR 10 mL 2  . ipratropium-albuterol (DUONEB) 0.5-2.5 (3) MG/3ML SOLN Take 3 mLs by nebulization 2 (two) times daily.    [name] Kitchen LANTUS 100 UNIT/ML injection INJECT 0.3MLS (30  UNITS TOTAL) INTO THE SKIN EVERY DAY 30 mL 3  . levothyroxine (SYNTHROID, LEVOTHROID) 88 MCG tablet TAKE ONE TABLET BY MOUTH EVERY DAY BEFORE BREAKFAST 90 tablet 1  . losartan (COZAAR) 25 MG tablet Take 1 tablet by mouth daily.    . nortriptyline (PAMELOR) 25 MG capsule TAKE 1 CAPSULE BY MOUTH AT BEDTIME 30 capsule 5  . nortriptyline (PAMELOR) 75 MG capsule TAKE 1 CAPSULE BY MOUTH AT BEDTIME 30 capsule 5  . nystatin (NYSTATIN) powder Apply 1 Bottle topically 2 (two) times daily as needed (yeast infection). 15 g 2  . oxyCODONE (OXY IR/ROXICODONE) 5 MG immediate release tablet Take 1 tablet by mouth twice daily as needed    . pravastatin (PRAVACHOL) 20 MG tablet TAKE ONE TABLET BY MOUTH EVERY DAY 90 tablet 0  . pravastatin (PRAVACHOL) 20 MG tablet TAKE ONE TABLET BY MOUTH EVERY DAY 90 tablet 1  . SPIRIVA HANDIHALER 18 MCG inhalation capsule PLACE 1 CAPSULE INTO THE INHALER AND INHALE DAILY 30 capsule 5  . SURE COMFORT INSULIN SYRINGE 30G X 1/2" 1 ML MISC USE FOUR (4) TIMES DAILY AS DIRECTED 100 each 3  . tiZANidine (ZANAFLEX) 2 MG tablet Take 2-4 mg by mouth every 12 (twelve) hours as needed for muscle spasms.    [name] Kitchen zolpidem (AMBIEN)  5 MG tablet TAKE ONE TABLET BY MOUTH AT BEDTIME AS NEEDED FOR SLEEP 15 tablet 0   No current facility-administered medications on file prior to visit.     Allergies  Allergen Reactions  . Gabapentin Anaphylaxis  . Lyrica [Pregabalin] Shortness Of Breath    Trouble breathing  . Ketorolac Tromethamine Hives  . Lisinopril Cough    Family History  Problem Relation Age of Onset  . Heart attack Mother 19       Deceased  . Heart disease Mother   . Emphysema Mother   . Alcoholism Mother   . COPD Father 52       Deceased  . Emphysema Father   . Alcoholism Father   . Esophageal varices Father   . Alcoholism Paternal Grandfather   . Diabetes Maternal Grandmother   . Heart disease Maternal Grandmother   . Lung cancer Maternal Grandfather   . Emphysema Maternal  Grandfather   . Brain cancer Maternal Aunt   . Diabetes Sister   . Heart defect Sister   . Obesity Son   . Breast cancer Maternal Aunt     Social History   Socioeconomic History  . Marital status: Single    Spouse name: None  . Number of children: None  . Years of education: None  . Highest education level: None  Social Needs  . Financial resource strain: None  . Food insecurity - worry: None  . Food insecurity - inability: None  . Transportation needs - medical: None  . Transportation needs - non-medical: None  Occupational History  . None  Tobacco Use  . Smoking status: Former Smoker    Packs/day: 2.50    Years: 45.00    Pack years: 112.50    Types: Cigarettes    Last attempt to quit: 07/21/2016    Years since quitting: 0.7  . Smokeless tobacco: Never Used  Substance and Sexual Activity  . Alcohol use: No  . Drug use: No  . Sexual activity: No  Other Topics Concern  . None  Social History Narrative  . None   Review of Systems - See HPI.  All other ROS are negative.  BP (!) 98/50   Pulse 94   Temp 97.7 F (36.5 C) (Oral)   Resp 16   Ht 5\' 4"  (1.626 m)   Wt 279 lb (126.6 kg)   SpO2 90% Comment: 3 L  BMI 47.89 kg/m   Physical Exam  Constitutional: She is oriented to person, place, and time and well-developed, well-nourished, and in no distress.  HENT:  Head: Normocephalic and atraumatic.  Right Ear: External ear normal.  Left Ear: External ear normal.  Nose: Nose normal.  Mouth/Throat: Oropharynx is clear and moist. No oropharyngeal exudate.  Eyes: Conjunctivae are normal.  Neck: Neck supple.  Cardiovascular: Normal rate, regular rhythm, normal heart sounds and intact distal pulses.  Pulmonary/Chest: No respiratory distress. She has wheezes. She has no rales. She exhibits no tenderness.  Lymphadenopathy:    She has no cervical adenopathy.  Neurological: She is alert and oriented to person, place, and time.  Skin: Skin is warm and dry.  Vitals  reviewed.   Assessment/Plan: 1. Wheeze Concerning for start of a COPD exacerbation. A1C well-controlled. Start steroid burst. Neb given in office with improvement.  - albuterol (PROVENTIL) (2.5 MG/3ML) 0.083% nebulizer solution 2.5 mg - doxycycline (VIBRAMYCIN) 100 MG capsule; Take 1 capsule (100 mg total) by mouth 2 (two) times daily.  Dispense: 14 capsule; Refill: 0 -  predniSONE (DELTASONE) 20 MG tablet; Take 2 tablets (40 mg total) by mouth daily with breakfast.  Dispense: 10 tablet; Refill: 0  2. Acute bacterial bronchitis Start antibiotics and steroid burst. CXR today to further assess. Supportive measures and OTC medications reviewed. Strict ER precautions reviewed.  - CBC w/Diff - DG Chest 2 View; Future - doxycycline (VIBRAMYCIN) 100 MG capsule; Take 1 capsule (100 mg total) by mouth 2 (two) times daily.  Dispense: 14 capsule; Refill: 0   [name], PA-C

## 2017-04-13 NOTE — Progress Notes (Signed)
Pre visit review using our clinic review tool, if applicable. No additional management support is needed unless otherwise documented below in the visit note. 

## 2017-04-14 ENCOUNTER — Other Ambulatory Visit: Payer: Self-pay | Admitting: Physician Assistant

## 2017-04-14 DIAGNOSIS — J42 Unspecified chronic bronchitis: Secondary | ICD-10-CM

## 2017-04-14 DIAGNOSIS — I272 Pulmonary hypertension, unspecified: Secondary | ICD-10-CM

## 2017-04-14 DIAGNOSIS — J9611 Chronic respiratory failure with hypoxia: Secondary | ICD-10-CM

## 2017-04-21 ENCOUNTER — Ambulatory Visit (INDEPENDENT_AMBULATORY_CARE_PROVIDER_SITE_OTHER): Payer: Medicare Other | Admitting: Physician Assistant

## 2017-04-21 ENCOUNTER — Encounter: Payer: Self-pay | Admitting: Physician Assistant

## 2017-04-21 VITALS — BP 118/70 | HR 87 | Temp 97.5°F | Resp 16 | Ht 64.0 in | Wt 279.0 lb

## 2017-04-21 DIAGNOSIS — J208 Acute bronchitis due to other specified organisms: Secondary | ICD-10-CM

## 2017-04-21 DIAGNOSIS — B9689 Other specified bacterial agents as the cause of diseases classified elsewhere: Secondary | ICD-10-CM

## 2017-04-21 MED ORDER — PREDNISONE 10 MG PO TABS: ORAL_TABLET | ORAL | 0 refills | Status: DC

## 2017-04-21 NOTE — Progress Notes (Signed)
Patient presents to clinic today for one-week follow-up of acute bacterial bronchitis and COPD exacerbation.  At last visit, chest x-ray was obtained and negative for acute cardiopulmonary abnormality.  Some evidence of pulmonary hypertension noted.  As such patient has been scheduled to reestablish with pulmonology.  Patient was given a steroid burst along with a prescription for doxycycline.  Patient has been taking medications as directed and tolerating well.  Endorses improvement in symptoms.  Denies any fever, chills, aches or chest pain.  States her breathing is significantly improved from last visit.  Denies any new or worsening symptoms.  Past Medical History:  Diagnosis Date  . Abdominal mass of other site   . Cervical compression fracture ([place])   . Chronic kidney disease, stage 3 (HCC)    Borderline Stage 2-3  . Chronic lower limb pain   . COPD (chronic obstructive pulmonary disease) ([place])   . CTS (carpal tunnel syndrome)   . Depression   . Diabetes ([place])    Type II  . Fibromyalgia   . Hepatitis C   . Hypercholesteremia   . Hypertension   . Hypothyroidism   . Morbid obesity ([place])   . Neuropathy    Diabetes  . OSA (obstructive sleep apnea)   . Osteoarthritis   . Renal cancer ([place])    Left Kidney Removed  . RLS (restless legs syndrome)   . Syncope   . Venous stasis     Current Outpatient Medications on File Prior to Visit  Medication Sig Dispense Refill  . ADVAIR DISKUS 500-50 MCG/DOSE AEPB INHALE 1 PUFF INTO THE LUNGS ONCE 60 each 1  . albuterol (PROVENTIL HFA;VENTOLIN HFA) 108 (90 Base) MCG/ACT inhaler Inhale 1-2 puffs into the lungs every 6 (six) hours as needed for wheezing or shortness of breath. 6.7 g 5  . CHANTIX 1 MG tablet TAKE ONE TABLET BY MOUTH TWICE DAILY 60 tablet 0  . EMBEDA 30-1.2 MG CPCR Take 1 capsule by mouth twice daily    . furosemide (LASIX) 20 MG tablet Take 1 tablet (20 mg total) by mouth daily as needed. (Patient taking differently: Take 20 mg  by mouth daily. ) 30 tablet 0  . HUMALOG 100 UNIT/ML injection INJECT 0.15 MILLILITERS (15 UNITS) INTO THE SKIN 3 TIMES DAILY AS NEEDED FOR HIGH BLOOD SUGAR 10 mL 2  . ipratropium-albuterol (DUONEB) 0.5-2.5 (3) MG/3ML SOLN Take 3 mLs by nebulization 2 (two) times daily.    [name] Kitchen LANTUS 100 UNIT/ML injection INJECT 0.3MLS (30 UNITS TOTAL) INTO THE SKIN EVERY DAY 30 mL 3  . levothyroxine (SYNTHROID, LEVOTHROID) 88 MCG tablet TAKE ONE TABLET BY MOUTH EVERY DAY BEFORE BREAKFAST 90 tablet 1  . losartan (COZAAR) 25 MG tablet Take 1 tablet by mouth daily.    . nortriptyline (PAMELOR) 25 MG capsule TAKE 1 CAPSULE BY MOUTH AT BEDTIME 30 capsule 5  . nortriptyline (PAMELOR) 75 MG capsule TAKE 1 CAPSULE BY MOUTH AT BEDTIME 30 capsule 5  . nystatin (NYSTATIN) powder Apply 1 Bottle topically 2 (two) times daily as needed (yeast infection). 15 g 2  . oxyCODONE (OXY IR/ROXICODONE) 5 MG immediate release tablet Take 1 tablet by mouth twice daily as needed    . pravastatin (PRAVACHOL) 20 MG tablet TAKE ONE TABLET BY MOUTH EVERY DAY 90 tablet 0  . pravastatin (PRAVACHOL) 20 MG tablet TAKE ONE TABLET BY MOUTH EVERY DAY 90 tablet 1  . SPIRIVA HANDIHALER 18 MCG inhalation capsule PLACE 1 CAPSULE INTO THE INHALER AND INHALE DAILY  30 capsule 5  . SURE COMFORT INSULIN SYRINGE 30G X 1/2" 1 ML MISC USE FOUR (4) TIMES DAILY AS DIRECTED 100 each 3  . tiZANidine (ZANAFLEX) 2 MG tablet Take 2-4 mg by mouth every 12 (twelve) hours as needed for muscle spasms.    [name] Kitchen zolpidem (AMBIEN) 5 MG tablet TAKE ONE TABLET BY MOUTH AT BEDTIME AS NEEDED FOR SLEEP 15 tablet 0   No current facility-administered medications on file prior to visit.     Allergies  Allergen Reactions  . Gabapentin Anaphylaxis  . Lyrica [Pregabalin] Shortness Of Breath    Trouble breathing  . Ketorolac Tromethamine Hives  . Lisinopril Cough    Family History  Problem Relation Age of Onset  . Heart attack Mother 63       Deceased  . Heart disease Mother     . Emphysema Mother   . Alcoholism Mother   . COPD Father 25       Deceased  . Emphysema Father   . Alcoholism Father   . Esophageal varices Father   . Alcoholism Paternal Grandfather   . Diabetes Maternal Grandmother   . Heart disease Maternal Grandmother   . Lung cancer Maternal Grandfather   . Emphysema Maternal Grandfather   . Brain cancer Maternal Aunt   . Diabetes Sister   . Heart defect Sister   . Obesity Son   . Breast cancer Maternal Aunt     Social History   Socioeconomic History  . Marital status: Single    Spouse name: None  . Number of children: None  . Years of education: None  . Highest education level: None  Social Needs  . Financial resource strain: None  . Food insecurity - worry: None  . Food insecurity - inability: None  . Transportation needs - medical: None  . Transportation needs - non-medical: None  Occupational History  . None  Tobacco Use  . Smoking status: Former Smoker    Packs/day: 2.50    Years: 45.00    Pack years: 112.50    Types: Cigarettes    Last attempt to quit: 07/21/2016    Years since quitting: 0.7  . Smokeless tobacco: Never Used  Substance and Sexual Activity  . Alcohol use: No  . Drug use: No  . Sexual activity: No  Other Topics Concern  . None  Social History Narrative  . None   Review of Systems - See HPI.  All other ROS are negative.  BP 118/70   Pulse 87   Temp (!) 97.5 F (36.4 C) (Oral)   Resp 16 Comment: 3 L  Ht 5\' 4"  (1.626 m)   Wt 279 lb (126.6 kg)   SpO2 96%   BMI 47.89 kg/m   Physical Exam  Constitutional: She is well-developed, well-nourished, and in no distress.  HENT:  Head: Normocephalic and atraumatic.  Right Ear: External ear normal.  Left Ear: External ear normal.  Nose: Nose normal.  Mouth/Throat: Oropharynx is clear and moist. No oropharyngeal exudate.  Eyes: Conjunctivae are normal.  Neck: Neck supple.  Cardiovascular: Normal rate, regular rhythm and normal heart sounds.   Pulmonary/Chest: Effort normal and breath sounds normal. No respiratory distress. She has no wheezes. She has no rales. She exhibits no tenderness.  Skin: Skin is warm and dry. No rash noted.  Psychiatric: Affect normal.  Vitals reviewed.    Assessment/Plan: 1. Acute bacterial bronchitis Clinically much improved. Lungs CTAB. Will extend prednisone due to history of needing longer courses.  Will do a quick taper. Continue supportive measures and OTC medications. Follow-up with Pulmonology on Monday as scheduled.   [name], PA-C

## 2017-04-21 NOTE — Patient Instructions (Signed)
Please stay well-hydrated and get plenty of rest. Please continue the prednisone, starting the taper given. Continue supportive measures and OTC medications discussed at last visit.  Start a saline nasal rinse as directed. If you note any worsening of symptoms, please return to the office or be seen at the ER or an Urgent Care.

## 2017-04-23 ENCOUNTER — Encounter (HOSPITAL_COMMUNITY): Payer: Self-pay

## 2017-04-23 ENCOUNTER — Emergency Department (HOSPITAL_COMMUNITY)
Admission: EM | Admit: 2017-04-23 | Discharge: 2017-04-23 | Disposition: A | Discharge status: Home / Self Care | Payer: Medicare Other | Attending: Emergency Medicine | Admitting: Emergency Medicine

## 2017-04-23 DIAGNOSIS — I129 Hypertensive chronic kidney disease with stage 1 through stage 4 chronic kidney disease, or unspecified chronic kidney disease: Secondary | ICD-10-CM | POA: Insufficient documentation

## 2017-04-23 DIAGNOSIS — Y939 Activity, unspecified: Secondary | ICD-10-CM | POA: Diagnosis not present

## 2017-04-23 DIAGNOSIS — W228XXA Striking against or struck by other objects, initial encounter: Secondary | ICD-10-CM | POA: Diagnosis not present

## 2017-04-23 DIAGNOSIS — Z79899 Other long term (current) drug therapy: Secondary | ICD-10-CM | POA: Diagnosis not present

## 2017-04-23 DIAGNOSIS — Y999 Unspecified external cause status: Secondary | ICD-10-CM | POA: Insufficient documentation

## 2017-04-23 DIAGNOSIS — Z87891 Personal history of nicotine dependence: Secondary | ICD-10-CM | POA: Diagnosis not present

## 2017-04-23 DIAGNOSIS — Z794 Long term (current) use of insulin: Secondary | ICD-10-CM | POA: Insufficient documentation

## 2017-04-23 DIAGNOSIS — E039 Hypothyroidism, unspecified: Secondary | ICD-10-CM | POA: Diagnosis not present

## 2017-04-23 DIAGNOSIS — E1122 Type 2 diabetes mellitus with diabetic chronic kidney disease: Secondary | ICD-10-CM | POA: Diagnosis not present

## 2017-04-23 DIAGNOSIS — J449 Chronic obstructive pulmonary disease, unspecified: Secondary | ICD-10-CM | POA: Insufficient documentation

## 2017-04-23 DIAGNOSIS — S81812A Laceration without foreign body, left lower leg, initial encounter: Secondary | ICD-10-CM | POA: Diagnosis not present

## 2017-04-23 DIAGNOSIS — N183 Chronic kidney disease, stage 3 (moderate): Secondary | ICD-10-CM | POA: Diagnosis not present

## 2017-04-23 DIAGNOSIS — Y92010 Kitchen of single-family (private) house as the place of occurrence of the external cause: Secondary | ICD-10-CM | POA: Insufficient documentation

## 2017-04-23 MED ORDER — LIDOCAINE HCL (PF) 1 % IJ SOLN
30.0000 mL | Freq: Once | INTRAMUSCULAR | Status: DC
  Filled 2017-04-23: qty 30

## 2017-04-23 MED ORDER — POVIDONE-IODINE 10 % EX SOLN
CUTANEOUS | Status: AC
  Administered 2017-04-23: 20:00:00
  Filled 2017-04-23: qty 150

## 2017-04-23 MED ORDER — TETANUS-DIPHTH-ACELL PERTUSSIS 5-2.5-18.5 LF-MCG/0.5 IM SUSP: 0.5000 mL | Freq: Once | INTRAMUSCULAR | Status: DC

## 2017-04-23 MED ORDER — HYDROCODONE-ACETAMINOPHEN 5-325 MG PO TABS
1.0000 | ORAL_TABLET | Freq: Once | ORAL | Status: AC
  Administered 2017-04-23: 1 via ORAL
  Filled 2017-04-23: qty 1

## 2017-04-23 MED ORDER — LIDOCAINE HCL (PF) 2 % IJ SOLN
INTRAMUSCULAR | Status: AC
  Administered 2017-04-23: 20 mL
  Filled 2017-04-23: qty 40

## 2017-04-23 NOTE — Discharge Instructions (Signed)
Keep the wound clean with soap and water daily, and apply a small film of antibiotic ointment such as Neosporin, to encourage healing.  For pain, take Tylenol.

## 2017-04-23 NOTE — ED Provider Notes (Signed)
[facility] EMERGENCY DEPARTMENT Provider Note   CSN: [id number] Arrival date & time: 04/23/17  1747     History   Chief Complaint Chief Complaint  Patient presents with  . Laceration    HPI Renee Noble is a 61 y.o. female.  She injured her left lower leg when she accidentally bumped into an open dishwasher door.  Her only injury was to the left lower leg.  She has been able to walk since.  There are no other known modifying factors.  HPI  Past Medical History:  Diagnosis Date  . Abdominal mass of other site   . Cervical compression fracture ([place])   . Chronic kidney disease, stage 3 (HCC)    Borderline Stage 2-3  . Chronic lower limb pain   . COPD (chronic obstructive pulmonary disease) ([place])   . CTS (carpal tunnel syndrome)   . Depression   . Diabetes ([place])    Type II  . Fibromyalgia   . Hepatitis C   . Hypercholesteremia   . Hypertension   . Hypothyroidism   . Morbid obesity ([place])   . Neuropathy    Diabetes  . OSA (obstructive sleep apnea)   . Osteoarthritis   . Renal cancer ([place])    Left Kidney Removed  . RLS (restless legs syndrome)   . Syncope   . Venous stasis     Patient Active Problem List   Diagnosis Date Noted  . Calf swelling 08/25/2016  . CKD (chronic kidney disease) 04/14/2016  . Hyperlipidemia 01/16/2016  . Essential hypertension 01/16/2016  . Hypoxia   . Thrombocytopenia ([place]) 07/12/2015  . Thyroid activity decreased 06/23/2015  . Encounter for chronic pain management 09/02/2014  . Hernia of abdominal cavity 07/30/2014  . Abnormal CXR 02/15/2014  . COPD GOLDIII with min reversibility  12/16/2013  . History of hepatitis C 12/16/2013  . Breast cancer screening 12/16/2013  . Diabetes mellitus, type II, insulin dependent ([place]) 12/16/2013  . Screening for osteoporosis 12/16/2013    Past Surgical History:  Procedure Laterality Date  . ABDOMINAL HERNIA REPAIR     x2  . ABDOMINAL HYSTERECTOMY    . BLADDER SUSPENSION    .  CHOLECYSTECTOMY    . CYST EXCISION     Head  . INCISIONAL HERNIA REPAIR    . KNEE ARTHROSCOPY     Bilateral  . NEPHRECTOMY     Left  . TONSILLECTOMY    . TOOTH EXTRACTION    . UMBILICAL HERNIA REPAIR    . VAGINA SURGERY    . WISDOM TOOTH EXTRACTION      OB History    No data available       Home Medications    Prior to Admission medications   Medication Sig Start Date End Date Taking? Authorizing Provider  albuterol (PROVENTIL HFA;VENTOLIN HFA) 108 (90 Base) MCG/ACT inhaler Inhale 1-2 puffs into the lungs every 6 (six) hours as needed for wheezing or shortness of breath. 01/07/16  Yes [name], PA-C  CHANTIX 1 MG tablet TAKE ONE TABLET BY MOUTH TWICE DAILY 04/07/17  Yes [name], PA-C  EMBEDA 30-1.2 MG CPCR Take 1 capsule by mouth 2 (two) times daily. Take 1 capsule by mouth twice daily 12/08/16  Yes [provider]  furosemide (LASIX) 20 MG tablet Take 1 tablet (20 mg total) by mouth daily as needed. Patient taking differently: Take 20 mg by mouth 2 (two) times daily.  12/10/14  Yes [name], PA-C  HUMALOG  100 UNIT/ML injection INJECT 0.15 MILLILITERS (15 UNITS) INTO THE SKIN 3 TIMES DAILY AS NEEDED FOR HIGH BLOOD SUGAR Patient taking differently: INJECT 10-15 MILLILITERS (15 UNITS) INTO THE SKIN 3 TIMES DAILY AS NEEDED FOR HIGH BLOOD SUGAR 12/30/16  Yes [name], PA-C  ipratropium-albuterol (DUONEB) 0.5-2.5 (3) MG/3ML SOLN Take 3 mLs by nebulization 2 (two) times daily.   Yes [provider]  LANTUS 100 UNIT/ML injection INJECT 0.3MLS (30 UNITS TOTAL) INTO THE SKIN EVERY DAY 08/04/16  Yes [name], PA-C  levothyroxine (SYNTHROID, LEVOTHROID) 88 MCG tablet TAKE ONE TABLET BY MOUTH EVERY DAY BEFORE BREAKFAST Patient taking differently: TAKE ONE TABLET BY MOUTH EVERY DAY IN THE EVENING 11/11/16  Yes [name], PA-C  losartan (COZAAR) 25 MG tablet Take 1 tablet by mouth daily. 08/11/16  Yes [provider]    nortriptyline (PAMELOR) 25 MG capsule TAKE 1 CAPSULE BY MOUTH AT BEDTIME 04/07/17  Yes [name], PA-C  nortriptyline (PAMELOR) 75 MG capsule TAKE 1 CAPSULE BY MOUTH AT BEDTIME 04/07/17  Yes [name], PA-C  nystatin (NYSTATIN) powder Apply 1 Bottle topically 2 (two) times daily as needed (yeast infection). 01/07/16  Yes [name], PA-C  oxyCODONE (OXY IR/ROXICODONE) 5 MG immediate release tablet Take 5 mg by mouth 2 (two) times daily as needed for breakthrough pain. Take 1 tablet by mouth twice daily as needed 12/09/16  Yes [provider]  pravastatin (PRAVACHOL) 20 MG tablet TAKE ONE TABLET BY MOUTH EVERY DAY 12/29/16  Yes [name], PA-C  predniSONE (DELTASONE) 10 MG tablet Take 4 tablets by mouth x 2 days, then 3 tablets x 2 days, then 2 tablets x 2 days, then 1 tablet x 2 days. 04/21/17  Yes [name], PA-C  SPIRIVA HANDIHALER 18 MCG inhalation capsule PLACE 1 CAPSULE INTO THE INHALER AND INHALE DAILY 03/09/17  Yes [name], PA-C  pravastatin (PRAVACHOL) 20 MG tablet TAKE ONE TABLET BY MOUTH EVERY DAY Patient not taking: Reported on 04/23/2017 01/12/17   [name], PA-C  SURE COMFORT INSULIN SYRINGE 30G X 1/2" 1 ML MISC USE FOUR (4) TIMES DAILY AS DIRECTED 08/31/16   [name], PA-C  tiZANidine (ZANAFLEX) 2 MG tablet Take 2-4 mg by mouth every 12 (twelve) hours as needed for muscle spasms.    [provider]  zolpidem (AMBIEN) 5 MG tablet TAKE ONE TABLET BY MOUTH AT BEDTIME AS NEEDED FOR SLEEP Patient not taking: Reported on 04/23/2017 03/09/17   [name], PA-C    Family History Family History  Problem Relation Age of Onset  . Heart attack Mother 64       Deceased  . Heart disease Mother   . Emphysema Mother   . Alcoholism Mother   . COPD Father 32       Deceased  . Emphysema Father   . Alcoholism Father   . Esophageal varices Father   . Alcoholism Paternal Grandfather   . Diabetes Maternal  Grandmother   . Heart disease Maternal Grandmother   . Lung cancer Maternal Grandfather   . Emphysema Maternal Grandfather   . Brain cancer Maternal Aunt   . Diabetes Sister   . Heart defect Sister   . Obesity Son   . Breast cancer Maternal Aunt     Social History Social History   Tobacco Use  . Smoking status: Former Smoker    Packs/day: 2.50    Years: 45.00  Pack years: 112.50    Types: Cigarettes    Last attempt to quit: 07/21/2016    Years since quitting: 0.7  . Smokeless tobacco: Never Used  Substance Use Topics  . Alcohol use: No  . Drug use: No     Allergies   Gabapentin; Lyrica [pregabalin]; Ketorolac tromethamine; and Lisinopril   Review of Systems Review of Systems  All other systems reviewed and are negative.    Physical Exam Updated Vital Signs BP (!) 149/80 (BP Location: Right Arm)   Pulse (!) 111   Temp (!) 97.5 F (36.4 C) (Oral)   Resp 20   Ht 5\' 5"  (1.651 m)   Wt 126.6 kg (279 lb)   SpO2 94%   BMI 46.43 kg/m   Physical Exam  Constitutional: She is oriented to person, place, and time. She appears well-developed.  Overweight  HENT:  Head: Normocephalic and atraumatic.  Eyes: Conjunctivae and EOM are normal. Pupils are equal, round, and reactive to light.  Neck: Normal range of motion and phonation normal. Neck supple.  Cardiovascular: Normal rate.  Pulmonary/Chest: Effort normal.  Musculoskeletal: Normal range of motion. She exhibits edema (Bilateral lower legs, moderate). She exhibits no deformity.  Gaping flap laceration, proximal based, mid tibia.  No associated bleeding, or deformity.  Neurological: She is alert and oriented to person, place, and time. She exhibits normal muscle tone.  Skin: Skin is warm and dry.  Psychiatric: She has a normal mood and affect. Her behavior is normal. Judgment and thought content normal.  Nursing note and vitals reviewed.    ED Treatments / Results  Labs (all labs ordered are listed, but only  abnormal results are displayed) Labs Reviewed - No data to display  EKG  EKG Interpretation None       Radiology No results found.  Procedures .[name] KitchenLaceration Repair Date/Time: 04/23/2017 9:27 PM Performed by: Daleen Bo, MD Authorized by: Daleen Bo, MD   Consent:    Consent obtained:  Verbal   Alternatives discussed:  No treatment, delayed treatment and observation Anesthesia (see MAR for exact dosages):    Anesthesia method:  Local infiltration   Local anesthetic:  Lidocaine 1% w/o epi Laceration details:    Location:  Leg   Leg location:  L lower leg   Length (cm):  7   Depth (mm):  1 Repair type:    Repair type:  Simple Pre-procedure details:    Preparation:  Patient was prepped and draped in usual sterile fashion Exploration:    Hemostasis achieved with:  Direct pressure   Wound exploration: wound explored through full range of motion and entire depth of wound probed and visualized     Wound extent: no areolar tissue violation noted, no fascia violation noted, no foreign bodies/material noted, no muscle damage noted, no nerve damage noted, no tendon damage noted and no underlying fracture noted     Contaminated: no   Treatment:    Area cleansed with:  Betadine   Amount of cleaning:  Standard   Irrigation solution:  Sterile water   Irrigation method:  Syringe   Visualized foreign bodies/material removed: no   Skin repair:    Repair method:  Sutures   Suture size:  3-0   Suture material:  Prolene Approximation:    Approximation:  Loose   Vermilion border: well-aligned   Post-procedure details:    Dressing:  Antibiotic ointment and sterile dressing   Patient tolerance of procedure:  Tolerated well, no immediate complications    (  including critical care time)  Medications Ordered in ED Medications  lidocaine (PF) (XYLOCAINE) 1 % injection 30 mL (30 mLs Infiltration Not Given 04/23/17 1948)  Tdap (BOOSTRIX) injection 0.5 mL (not administered)    HYDROcodone-acetaminophen (NORCO/VICODIN) 5-325 MG per tablet 1 tablet (1 tablet Oral Given 04/23/17 1939)  lidocaine (XYLOCAINE) 2 % injection (20 mLs  Given 04/23/17 1938)  povidone-iodine (BETADINE) 10 % external solution (  Given 04/23/17 1938)     Initial Impression / Assessment and Plan / ED Course  I have reviewed the triage vital signs and the nursing notes.  Pertinent labs & imaging results that were available during my care of the patient were reviewed by me and considered in my medical decision making (see chart for details).      Patient Vitals for the past 24 hrs:  BP Temp Temp src Pulse Resp SpO2 Height Weight  04/23/17 1747 (!) 149/80 (!) 97.5 F (36.4 C) Oral (!) 111 20 94 % - -  04/23/17 1746 - - - - - - 5\' 5"  (1.651 m) 126.6 kg (279 lb)    At discharge- reevaluation with update and discussion. After initial assessment and treatment, an updated evaluation reveals she is comfortable has no further complaints.  Findings discussed with patient and all questions answered. Daleen Bo      Final Clinical Impressions(s) / ED Diagnoses   Final diagnoses:  Laceration of left lower extremity, initial encounter   Leg laceration, accidental.  Laceration is superficial flap, of the lower leg, which will require prolonged wound care, and PCP follow-up.  Doubt laceration or metabolic instability.  Nursing Notes Reviewed/ Care Coordinated Applicable Imaging Reviewed Interpretation of Laboratory Data incorporated into ED treatment  The patient appears reasonably screened and/or stabilized for discharge and I doubt any other medical condition or other [facility] requiring further screening, evaluation, or treatment in the ED at this time prior to discharge.  Plan: Home Medications-OTC analgesia as needed, continue current medications; Home Treatments-wound care at home; return here if the recommended treatment, does not improve the symptoms; Recommended follow up-suture removal 14  days.   ED Discharge Orders    None       Daleen Bo, MD 04/23/17 2153

## 2017-04-23 NOTE — ED Triage Notes (Signed)
EMS reports she hit her left lower leg on dishwasher.  Pt has swelling and dark discolration to both legs.  Skin dry.  EMS reports wound is oozing clear blood tinged drainage.

## 2017-04-25 ENCOUNTER — Ambulatory Visit: Payer: Medicare Other | Admitting: Internal Medicine

## 2017-04-28 ENCOUNTER — Ambulatory Visit: Payer: Medicare Other | Admitting: Internal Medicine

## 2017-04-28 ENCOUNTER — Encounter: Payer: Self-pay | Admitting: Internal Medicine

## 2017-04-28 VITALS — BP 126/74 | HR 96 | Ht 64.0 in | Wt 276.0 lb

## 2017-04-28 DIAGNOSIS — J9612 Chronic respiratory failure with hypercapnia: Secondary | ICD-10-CM | POA: Diagnosis not present

## 2017-04-28 DIAGNOSIS — J9611 Chronic respiratory failure with hypoxia: Secondary | ICD-10-CM | POA: Insufficient documentation

## 2017-04-28 DIAGNOSIS — I2781 Cor pulmonale (chronic): Secondary | ICD-10-CM

## 2017-04-28 DIAGNOSIS — J449 Chronic obstructive pulmonary disease, unspecified: Secondary | ICD-10-CM | POA: Diagnosis not present

## 2017-04-28 DIAGNOSIS — Z23 Encounter for immunization: Secondary | ICD-10-CM | POA: Diagnosis not present

## 2017-04-28 MED ORDER — TIOTROPIUM BROMIDE-OLODATEROL 2.5-2.5 MCG/ACT IN AERS: 2.0000 | INHALATION_SPRAY | Freq: Every day | RESPIRATORY_TRACT | 11 refills | Status: DC

## 2017-04-28 MED ORDER — TIOTROPIUM BROMIDE-OLODATEROL 2.5-2.5 MCG/ACT IN AERS: 2.0000 | INHALATION_SPRAY | Freq: Every day | RESPIRATORY_TRACT | 0 refills | Status: DC

## 2017-04-28 NOTE — Patient Instructions (Addendum)
Start stiolto 2 pffs each am   Please see patient coordinator before you leave today  to schedule 2d echo and overnight 02 sat on cpap and your 02 2.5lpm   Please schedule a follow up office visit in 6 weeks, call sooner if needed  - add need to clarify who is following cpap ? Needs bipap

## 2017-04-28 NOTE — Progress Notes (Signed)
Subjective:    Patient ID: Renee Noble, female    DOB: 01/13/1956,    MRN: [id number]    Brief patient profile:  61 yowf quit smoking 07/2016   referred by Renee Noble to pulmonary clinic for copd evaluation in 12/2013  With GOLD III criteria and referred back  04/28/2017      04/28/2017  New pt ov/Renee Noble re:re- establish for eval of sob/ ? Clear for surgery / maint rx = spiriva dpi Chief Complaint  Patient presents with  . Pulmonary Consult    Referred by Renee Noble, PA for eval of pulmonary hypertension. Pt states she also needs clearance for hernia repair surgery.  She c/o occ non prod cough. She states her breathing is doing well today. She rarely uses her rescue inhaler.    02 sat at rest RA = low 90s Also wearing 02 2.5 lpm hs Walking x 197ft on 02 = 2.5lpm may be 100 ft slow pace = [facility] = can't walk 100 yards even at a slow pace at a flat grade s stopping due to sob  / may be uses saba hfa once a month, almost never noct unless flaring with uri  Sleeping one- 2 pillows on cpap 2.5 lpm  Losing wt voluntarily hoping to have abd wall hernia surgery 2019   No obvious day to day or daytime variability or assoc excess/ purulent sputum or mucus plugs or hemoptysis or cp or chest tightness, subjective wheeze or overt sinus or hb symptoms. No unusual exposure hx or h/o childhood pna/ asthma or knowledge of premature birth.  Sleeping ok as above on cpap  without nocturnal  or early am exacerbation  of respiratory  c/o's or need for noct saba. Also denies any obvious fluctuation of symptoms with weather or environmental changes or other aggravating or alleviating factors except as outlined above   Current Allergies, Complete Past Medical History, Past Surgical History, Family History, and Social History were reviewed in [employer] record.  ROS  The following are not active complaints unless bolded Hoarseness, sore throat, dysphagia, dental problems, itching,  sneezing,  nasal congestion or discharge of excess mucus or purulent secretions, ear ache,   fever, chills, sweats, unintended wt loss or wt gain, classically pleuritic or exertional cp,  orthopnea pnd or leg swelling, presyncope, palpitations, abdominal pain, anorexia, nausea, vomiting, diarrhea  or change in bowel habits or change in bladder habits, change in stools or change in urine, dysuria, hematuria,  rash, arthralgias, visual complaints, headache, numbness, weakness or ataxia or problems with walking or coordination,  change in mood/affect or memory.        Current Meds  Medication Sig  . albuterol (PROVENTIL HFA;VENTOLIN HFA) 108 (90 Base) MCG/ACT inhaler Inhale 1-2 puffs into the lungs every 6 (six) hours as needed for wheezing or shortness of breath.  . CHANTIX 1 MG tablet TAKE ONE TABLET BY MOUTH TWICE DAILY  . EMBEDA 30-1.2 MG CPCR Take 1 capsule by mouth 2 (two) times daily. Take 1 capsule by mouth twice daily  . furosemide (LASIX) 20 MG tablet Take 1 tablet (20 mg total) by mouth daily as needed. (Patient taking differently: Take 20 mg by mouth 2 (two) times daily. )  . HUMALOG 100 UNIT/ML injection INJECT 0.15 MILLILITERS (15 UNITS) INTO THE SKIN 3 TIMES DAILY AS NEEDED FOR HIGH BLOOD SUGAR (Patient taking differently: INJECT 10-15 MILLILITERS (15 UNITS) INTO THE SKIN 3 TIMES DAILY AS NEEDED FOR HIGH BLOOD SUGAR)  .  ipratropium-albuterol (DUONEB) 0.5-2.5 (3) MG/3ML SOLN Take 3 mLs by nebulization 2 (two) times daily.  [name] Kitchen LANTUS 100 UNIT/ML injection INJECT 0.3MLS (30 UNITS TOTAL) INTO THE SKIN EVERY DAY  . levothyroxine (SYNTHROID, LEVOTHROID) 88 MCG tablet TAKE ONE TABLET BY MOUTH EVERY DAY BEFORE BREAKFAST (Patient taking differently: TAKE ONE TABLET BY MOUTH EVERY DAY IN THE EVENING)  . losartan (COZAAR) 25 MG tablet Take 1 tablet by mouth daily.  . nortriptyline (PAMELOR) 25 MG capsule TAKE 1 CAPSULE BY MOUTH AT BEDTIME  . nortriptyline (PAMELOR) 75 MG capsule TAKE 1 CAPSULE BY  MOUTH AT BEDTIME  . nystatin (NYSTATIN) powder Apply 1 Bottle topically 2 (two) times daily as needed (yeast infection).  [name] Kitchen oxyCODONE (OXY IR/ROXICODONE) 5 MG immediate release tablet Take 5 mg by mouth 2 (two) times daily as needed for breakthrough pain. Take 1 tablet by mouth twice daily as needed  . OXYGEN O2 2lpm with sleep and 2.5 lpm with exertion  [employer]  . pravastatin (PRAVACHOL) 20 MG tablet TAKE ONE TABLET BY MOUTH EVERY DAY  . SURE COMFORT INSULIN SYRINGE 30G X 1/2" 1 ML MISC USE FOUR (4) TIMES DAILY AS DIRECTED  . tiZANidine (ZANAFLEX) 2 MG tablet Take 2-4 mg by mouth every 12 (twelve) hours as needed for muscle spasms.  . [  SPIRIVA HANDIHALER 18 MCG inhalation capsule PLACE 1 CAPSULE INTO THE INHALER AND INHALE DAILY                            Objective:   Physical Exam   amb obese pleasant wf nad  Wt Readings from Last 3 Encounters:  04/28/17 276 lb (125.2 kg)  04/23/17 279 lb (126.6 kg)  04/21/17 279 lb (126.6 kg)    Vital signs reviewed - Note on arrival 02 sats  98% on 2lpm       elephantiasis both legs /  Full dentures  / Modified Mallampati Score =   1 +   HEENT: nl   turbinates bilaterally, and oropharynx. Nl external ear canals without cough reflex - full dentures Modified Mallampati Score =   1    NECK :  without JVD/Nodes/TM/ nl carotid upstrokes bilaterally   LUNGS: no acc muscle use,  Nl contour chest with distant bs bilaterally sym , no wheezing  n or cough on insp/exp  CV:  RRR  no s3 or murmur or increase in P2, and no edema   ABD:  Massively soft and nontender with limited inspiratory excursion  No bruits or organomegaly appreciated, bowel sounds nl/ cantaloupe  sized abd wall hernia noted  MS:  Nl gait/ ext warm without deformities, calf tenderness, cyanosis or clubbing No obvious joint restrictions   SKIN: warm and dry with elephantiasis changes both legs/ L leg wrapped and not examined above the ankle    NEURO:  alert, approp,  nl sensorium with  no motor or cerebellar deficits apparent.          I personally reviewed images and agree with radiology impression as follows:  CXR:   04/13/17 Cardiomegaly with moderate pulmonary interstitial prominence, favored to be related to COPD/ chronic bronchitis.  No evidence of superimposed pneumonia.  Pulmonary artery enlargement suggests pulmonary arterial hypertension.     Assessment & Plan:

## 2017-04-29 ENCOUNTER — Encounter: Payer: Self-pay | Admitting: Internal Medicine

## 2017-04-29 DIAGNOSIS — I2781 Cor pulmonale (chronic): Secondary | ICD-10-CM | POA: Insufficient documentation

## 2017-04-29 NOTE — Assessment & Plan Note (Addendum)
Clinical dx/ needs ono on 02 and echo> ordered today   Also very high risk TEPAH from occult dvt/ PE and very difficult to exclude in setting of such severe copd (v/q not likely helpful) so need to keep this in ddx esp for acute clinical change> CTa  Would be needed

## 2017-04-29 NOTE — Assessment & Plan Note (Addendum)
HCO3   12/31/16  = 38    Appears well compensated on 2lpm today   but needs ono on cpap/2.5 lpm to be sure the 02 rx is adequate esp is setting of ? Cor pulmonale and ? Re-eval by sleep medicine with consideration for bipap  - will set this up when the pulmonary portion of w/u is complete

## 2017-04-29 NOTE — Assessment & Plan Note (Signed)
Trial off advair 12/25/13 as not using it consistently anyway> pt restarted around 02/03/14   - 02/14/2014 PFTs  FEV1  1.22 (48%) ratio 53 p 12% improvement from SABA and dlco 73% corrects to 84  - 02/15/2014  Walked RA  2 laps @ 185 ft each stopped due to  89% /   nl pace / legs gave out same time chest got "tight" relieved immediately at rest  - Spirometry 04/28/2017  FEV1 0.74 (30%)  Ratio 47 with classic curvature p spiriva dpi in am  - 04/28/2017  After extensive coaching device effectiveness =    75% > try stiolto 2 each am    Unfortunately she continue to smoke after last ov but the good news is that she has now smoked and is losing considerable wt which should help a lot with regaining some level of mobility.  Pt is Group B in terms of symptom/risk and laba/lama therefore appropriate rx at this point so will try stiolto and w/u for cor pulmonale (see separate a/p)  Prior to considering clearing for elective surgery   Total time devoted to counseling  > 50 % of initial 60 min office visit:  review case with pt/ discussion of options/alternatives/ personally creating written customized instructions  in presence of pt  then going over those specific  Instructions directly with the pt including how to use all of the meds but in particular covering each new medication in detail and the difference between the maintenance= "automatic" meds and the prns using an action plan format for the latter (If this problem/symptom => do that organization reading Left to right).  Please see AVS from this visit for a full list of these instructions which I personally wrote for this pt and  are unique to this visit.

## 2017-04-29 NOTE — Assessment & Plan Note (Addendum)
Body mass index is 47.38 kg/m.  -  trending down/ encouraged  Lab Results  Component Value Date   TSH 2.39 09/28/2016     Contributing to PE/ gerd risk/ doe/reviewed the need and the process to achieve and maintain neg calorie balance > defer f/u primary care including intermittently monitoring thyroid status

## 2017-05-05 ENCOUNTER — Other Ambulatory Visit: Payer: Self-pay

## 2017-05-05 ENCOUNTER — Ambulatory Visit (HOSPITAL_COMMUNITY): Payer: Medicare Other | Attending: Cardiovascular Disease

## 2017-05-05 DIAGNOSIS — J9611 Chronic respiratory failure with hypoxia: Secondary | ICD-10-CM | POA: Diagnosis not present

## 2017-05-05 DIAGNOSIS — I082 Rheumatic disorders of both aortic and tricuspid valves: Secondary | ICD-10-CM | POA: Insufficient documentation

## 2017-05-05 DIAGNOSIS — J9612 Chronic respiratory failure with hypercapnia: Secondary | ICD-10-CM

## 2017-05-05 DIAGNOSIS — N189 Chronic kidney disease, unspecified: Secondary | ICD-10-CM | POA: Diagnosis not present

## 2017-05-05 LAB — ECHOCARDIOGRAM COMPLETE
AO mean calculated velocity dopler: 154 cm/s
AOVTI: 42 cm
AV Area VTI index: 0.63 cm2/m2
AV Area VTI: 1.16 cm2
AV Peak grad: 21 mmHg
AV VEL mean LVOT/AV: 0.35
AV pk vel: 227 cm/s
AV vel: 1.41
AVAREAMEANV: 1.11 cm2
AVAREAMEANVIN: 0.49 cm2/m2
AVG: 11 mmHg
Ao pk vel: 0.37 m/s
CHL CUP AV PEAK INDEX: 0.52
CHL CUP DOP CALC LVOT VTI: 18.8 cm
CHL CUP MV DEC (S): 183
E decel time: 183 msec
E/e' ratio: 11.65
FS: 25 % — AB (ref 28–44)
IV/PV OW: 1.12
LA ID, A-P, ES: 43 mm
LA diam end sys: 43 mm
LA diam index: 1.92 cm/m2
LA vol: 62 mL
LAVOLA4C: 49 mL
LAVOLIN: 27.7 mL/m2
LV E/e' medial: 11.65
LV E/e'average: 11.65
LV PW d: 13.2 mm — AB (ref 0.6–1.1)
LV TDI E'LATERAL: 10.3
LVELAT: 10.3 cm/s
LVOT area: 3.14 cm2
LVOT diameter: 20 mm
LVOT peak grad rest: 3 mmHg
LVOTPV: 83.5 cm/s
LVOTSV: 59 mL
LVOTVTI: 0.45 cm
MV Peak grad: 6 mmHg
MV pk E vel: 120 m/s
MVPKAVEL: 149 m/s
TDI e' medial: 7.9
Valve area index: 0.63
Valve area: 1.41 cm2

## 2017-05-05 NOTE — Progress Notes (Signed)
Spoke with pt and notified of results per Dr. Wert. Pt verbalized understanding and denied any questions. 

## 2017-05-06 ENCOUNTER — Ambulatory Visit (INDEPENDENT_AMBULATORY_CARE_PROVIDER_SITE_OTHER): Payer: Medicare Other | Admitting: Physician Assistant

## 2017-05-06 ENCOUNTER — Other Ambulatory Visit: Payer: Self-pay

## 2017-05-06 ENCOUNTER — Encounter: Payer: Self-pay | Admitting: Physician Assistant

## 2017-05-06 VITALS — BP 126/74 | HR 86 | Temp 98.3°F | Resp 16 | Ht 64.0 in | Wt 276.0 lb

## 2017-05-06 DIAGNOSIS — S81812D Laceration without foreign body, left lower leg, subsequent encounter: Secondary | ICD-10-CM | POA: Diagnosis not present

## 2017-05-06 NOTE — Progress Notes (Signed)
Patient presents to clinic today for suture removal. Patient seen in ER on 04/23/17 for laceration to left lower extremity after bumping into the dishwasher at home. 5 simple interrupted sutures placed. Wound care reviewed. Patient was instructed to follow-up with PCP in 2 weeks for removal. States pain has resolved. Notes drainage but this is weeping from her chronic edema. Denies an purulent drainage. Denies redness of warmth. States some of the sutures have felt loose since placement. Does not believe any have fallen out.  Past Medical History:  Diagnosis Date  . Abdominal mass of other site   . Cervical compression fracture ([place])   . Chronic kidney disease, stage 3 (HCC)    Borderline Stage 2-3  . Chronic lower limb pain   . COPD (chronic obstructive pulmonary disease) ([place])   . CTS (carpal tunnel syndrome)   . Depression   . Diabetes ([place])    Type II  . Fibromyalgia   . Hepatitis C   . Hypercholesteremia   . Hypertension   . Hypothyroidism   . Morbid obesity ([place])   . Neuropathy    Diabetes  . OSA (obstructive sleep apnea)   . Osteoarthritis   . Renal cancer ([place])    Left Kidney Removed  . RLS (restless legs syndrome)   . Syncope   . Venous stasis     Current Outpatient Medications on File Prior to Visit  Medication Sig Dispense Refill  . albuterol (PROVENTIL HFA;VENTOLIN HFA) 108 (90 Base) MCG/ACT inhaler Inhale 1-2 puffs into the lungs every 6 (six) hours as needed for wheezing or shortness of breath. 6.7 g 5  . CHANTIX 1 MG tablet TAKE ONE TABLET BY MOUTH TWICE DAILY 60 tablet 0  . EMBEDA 30-1.2 MG CPCR Take 1 capsule by mouth 2 (two) times daily. Take 1 capsule by mouth twice daily    . furosemide (LASIX) 20 MG tablet Take 1 tablet (20 mg total) by mouth daily as needed. (Patient taking differently: Take 20 mg by mouth 2 (two) times daily. ) 30 tablet 0  . HUMALOG 100 UNIT/ML injection INJECT 0.15 MILLILITERS (15 UNITS) INTO THE SKIN 3 TIMES DAILY AS NEEDED FOR HIGH  BLOOD SUGAR (Patient taking differently: INJECT 10-15 MILLILITERS (15 UNITS) INTO THE SKIN 3 TIMES DAILY AS NEEDED FOR HIGH BLOOD SUGAR) 10 mL 2  . ipratropium-albuterol (DUONEB) 0.5-2.5 (3) MG/3ML SOLN Take 3 mLs by nebulization 2 (two) times daily.    [name] Kitchen LANTUS 100 UNIT/ML injection INJECT 0.3MLS (30 UNITS TOTAL) INTO THE SKIN EVERY DAY 30 mL 3  . levothyroxine (SYNTHROID, LEVOTHROID) 88 MCG tablet TAKE ONE TABLET BY MOUTH EVERY DAY BEFORE BREAKFAST (Patient taking differently: TAKE ONE TABLET BY MOUTH EVERY DAY IN THE EVENING) 90 tablet 1  . losartan (COZAAR) 25 MG tablet Take 1 tablet by mouth daily.    . nortriptyline (PAMELOR) 25 MG capsule TAKE 1 CAPSULE BY MOUTH AT BEDTIME 30 capsule 5  . nortriptyline (PAMELOR) 75 MG capsule TAKE 1 CAPSULE BY MOUTH AT BEDTIME 30 capsule 5  . nystatin (NYSTATIN) powder Apply 1 Bottle topically 2 (two) times daily as needed (yeast infection). 15 g 2  . oxyCODONE (OXY IR/ROXICODONE) 5 MG immediate release tablet Take 5 mg by mouth 2 (two) times daily as needed for breakthrough pain. Take 1 tablet by mouth twice daily as needed    . OXYGEN O2 2lpm with sleep and 2.5 lpm with exertion  [employer]    . pravastatin (PRAVACHOL) 20 MG tablet TAKE ONE  TABLET BY MOUTH EVERY DAY 90 tablet 0  . SURE COMFORT INSULIN SYRINGE 30G X 1/2" 1 ML MISC USE FOUR (4) TIMES DAILY AS DIRECTED 100 each 3  . Tiotropium Bromide-Olodaterol (STIOLTO RESPIMAT) 2.5-2.5 MCG/ACT AERS Inhale 2 puffs into the lungs daily. 1 Inhaler 11  . tiZANidine (ZANAFLEX) 2 MG tablet Take 2-4 mg by mouth every 12 (twelve) hours as needed for muscle spasms.     No current facility-administered medications on file prior to visit.     Allergies  Allergen Reactions  . Gabapentin Anaphylaxis  . Lyrica [Pregabalin] Shortness Of Breath    Trouble breathing  . Ketorolac Tromethamine Hives  . Lisinopril Cough    Family History  Problem Relation Age of Onset  . Heart attack Mother 63       Deceased  .  Heart disease Mother   . Emphysema Mother   . Alcoholism Mother   . COPD Father 50       Deceased  . Emphysema Father   . Alcoholism Father   . Esophageal varices Father   . Alcoholism Paternal Grandfather   . Diabetes Maternal Grandmother   . Heart disease Maternal Grandmother   . Lung cancer Maternal Grandfather   . Emphysema Maternal Grandfather   . Brain cancer Maternal Aunt   . Diabetes Sister   . Heart defect Sister   . Obesity Son   . Breast cancer Maternal Aunt     Social History   Socioeconomic History  . Marital status: Single    Spouse name: None  . Number of children: None  . Years of education: None  . Highest education level: None  Social Needs  . Financial resource strain: None  . Food insecurity - worry: None  . Food insecurity - inability: None  . Transportation needs - medical: None  . Transportation needs - non-medical: None  Occupational History  . None  Tobacco Use  . Smoking status: Former Smoker    Packs/day: 2.50    Years: 45.00    Pack years: 112.50    Types: Cigarettes    Last attempt to quit: 07/21/2016    Years since quitting: 0.7  . Smokeless tobacco: Never Used  Substance and Sexual Activity  . Alcohol use: No  . Drug use: No  . Sexual activity: No  Other Topics Concern  . None  Social History Narrative  . None   Review of Systems - See HPI.  All other ROS are negative.  BP 126/74   Pulse 86   Temp 98.3 F (36.8 C) (Oral)   Resp 16   Ht 5\' 4"  (1.626 m)   Wt 276 lb (125.2 kg)   HC 16" (40.6 cm)   SpO2 96%   BMI 47.38 kg/m   Physical Exam  Recent Results (from the past 2160 hour(s))  ECHOCARDIOGRAM COMPLETE     Status: Abnormal   Collection Time: 05/05/17  1:57 PM  Result Value Ref Range   AV vel 1.41    LV PW d 13.2 (A) 0.6 - 1.1 mm   FS 25 (A) 28 - 44 %   LA vol 62 mL   LA ID, A-P, ES 43 mm   IVS/LV PW RATIO, ED 1.12    LVOT VTI 18.8 cm   LV e' LATERAL 10.3 cm/s   LV E/e' medial 11.65    LV E/e'average  11.65    AV pk vel 227 cm/s   AV Area VTI index .63 cm2/m2  AV Area VTI 1.16 cm2   AV VEL mean LVOT/AV .35    AV Area mean vel 1.11 cm2   AV area mean vel ind .49 cm2/m2   LA diam index 1.92 cm/m2   LA vol A4C 49 ml   Mean grad 11 mmHg   Valve area 1.41 cm2   LVOT peak grad rest 3 mmHg   E decel time 183 msec   LVOT diameter 20 mm   LVOT area 3.14 cm2   LVOT peak vel 83.5 cm/s   LVOT peak VTI .45 cm   Ao pk vel .37 m/s   VTI 42 cm   LVOT SV 59.00 mL   Peak grad 21 mmHg   Peak grad 6 mmHg   E/e' ratio 11.65    AO mean calculated velocity dopler 154 cm/s   MV pk E vel 120 m/s   MV pk A vel 149 m/s   LA vol index 27.7 mL/m2   Valve area index .63    AV peak Index .56    MV Dec 183    LA diam end sys 43.00 mm   TDI e' medial 7.90    TDI e' lateral 10.30     Assessment/Plan: 1. Laceration of left lower leg, subsequent encounter Will take some time to completely heal. The lateral edges of wound had great approximation and healing. The middle two sutures with poor approximation and not really holding skin together as tissue is dead. Removed as they were not helping to hold. All sutures removed without issue. Discussed prognosis for continued healing. Wound care reviewed. Will sent [facility] RN for wound care to house. Follow-up scheduled.   [name], PA-C

## 2017-05-06 NOTE — Patient Instructions (Signed)
Sutures have been removed successfully.  Mild bleeding and some weeping edema during removal noted. This is to be expected. The lateral parts of the wound have healed faster than the central portion. This is partly due to the sutures not being tight in this area. We are outisde the window where this can be resutured. Will have to continue healing from the inside out and may leave a scar.  Keep skin clean and dry.  You can use a thing layer of neosporin still but do not use much.  We do not want the wound to be wet.  Follow-up with me in 1.5 weeks for reassessment. Giving your health history and location of wound, I am seeing a wound nurse out to the house to provide skilled care a few times per week to promote more rapid healing.   Return immediately for any increased bleeding, tenderness, or drainage.

## 2017-05-07 ENCOUNTER — Other Ambulatory Visit: Payer: Self-pay | Admitting: Physician Assistant

## 2017-05-13 ENCOUNTER — Other Ambulatory Visit: Payer: Self-pay | Admitting: Emergency Medicine

## 2017-05-13 MED ORDER — FLUTICASONE-SALMETEROL 500-50 MCG/DOSE IN AEPB: 1.0000 | INHALATION_SPRAY | Freq: Two times a day (BID) | RESPIRATORY_TRACT | 3 refills | Status: DC

## 2017-05-16 ENCOUNTER — Other Ambulatory Visit: Payer: Self-pay | Admitting: Physician Assistant

## 2017-05-18 ENCOUNTER — Telehealth: Payer: Self-pay | Admitting: Physician Assistant

## 2017-05-18 ENCOUNTER — Other Ambulatory Visit: Payer: Self-pay

## 2017-05-18 ENCOUNTER — Ambulatory Visit (INDEPENDENT_AMBULATORY_CARE_PROVIDER_SITE_OTHER): Payer: Medicare Other | Admitting: Physician Assistant

## 2017-05-18 ENCOUNTER — Encounter: Payer: Self-pay | Admitting: Physician Assistant

## 2017-05-18 VITALS — BP 122/70 | HR 89 | Temp 97.9°F | Resp 16 | Ht 64.0 in | Wt 283.0 lb

## 2017-05-18 DIAGNOSIS — R609 Edema, unspecified: Secondary | ICD-10-CM | POA: Diagnosis not present

## 2017-05-18 DIAGNOSIS — S81802D Unspecified open wound, left lower leg, subsequent encounter: Secondary | ICD-10-CM

## 2017-05-18 LAB — BASIC METABOLIC PANEL
BUN: 22 mg/dL (ref 6–23)
CALCIUM: 8.7 mg/dL (ref 8.4–10.5)
CHLORIDE: 97 meq/L (ref 96–112)
CO2: 34 mEq/L — ABNORMAL HIGH (ref 19–32)
CREATININE: 1.27 mg/dL — AB (ref 0.40–1.20)
GFR: 45.05 mL/min — ABNORMAL LOW (ref >60.00)
Glucose, Bld: 67 mg/dL — ABNORMAL LOW (ref 70–99)
Potassium: 4.4 mEq/L (ref 3.5–5.1)
SODIUM: 138 meq/L (ref 135–145)

## 2017-05-18 MED ORDER — CEPHALEXIN 500 MG PO CAPS: 500.0000 mg | ORAL_CAPSULE | Freq: Two times a day (BID) | ORAL | 0 refills | Status: AC

## 2017-05-18 MED ORDER — TORSEMIDE 20 MG PO TABS: 20.0000 mg | ORAL_TABLET | Freq: Two times a day (BID) | ORAL | 0 refills | Status: DC

## 2017-05-18 NOTE — Patient Instructions (Signed)
Please go to the lab today for blood work.  I will call you with your results. We will alter treatment regimen(s) if indicated by your results.   Please take antibiotic as directed. Wound care nurse is coming to your home tomorrow. I am setting you up with a wound care specialist as well.  Stop the Furosemide (Lasix). Take the Torsemide as directed. Follow-up 1 week for reassessment.

## 2017-05-18 NOTE — Progress Notes (Signed)
Patient presents to clinic today for follow-up regarding healing of wound of left lower leg. Sutures removed on 05/06/17. Wound care reviewed with patient at that time. Since then, patient has been following instructions. Has noted decreased weeping. No purulent drainage. Over the past couple of days has noted some redness and tenderness of the area above the wound. Denies fever, chills. Has not heard from Wound RN since last visit.   Past Medical History:  Diagnosis Date  . Abdominal mass of other site   . Cervical compression fracture ([place])   . Chronic kidney disease, stage 3 (HCC)    Borderline Stage 2-3  . Chronic lower limb pain   . COPD (chronic obstructive pulmonary disease) ([place])   . CTS (carpal tunnel syndrome)   . Depression   . Diabetes ([place])    Type II  . Fibromyalgia   . Hepatitis C   . Hypercholesteremia   . Hypertension   . Hypothyroidism   . Morbid obesity ([place])   . Neuropathy    Diabetes  . OSA (obstructive sleep apnea)   . Osteoarthritis   . Renal cancer ([place])    Left Kidney Removed  . RLS (restless legs syndrome)   . Syncope   . Venous stasis     Current Outpatient Medications on File Prior to Visit  Medication Sig Dispense Refill  . albuterol (PROVENTIL HFA;VENTOLIN HFA) 108 (90 Base) MCG/ACT inhaler Inhale 1-2 puffs into the lungs every 6 (six) hours as needed for wheezing or shortness of breath. 6.7 g 5  . CHANTIX 1 MG tablet TAKE ONE TABLET BY MOUTH TWICE DAILY 60 tablet 0  . EMBEDA 30-1.2 MG CPCR Take 1 capsule by mouth 2 (two) times daily. Take 1 capsule by mouth twice daily    . Fluticasone-Salmeterol (ADVAIR DISKUS) 500-50 MCG/DOSE AEPB Inhale 1 puff into the lungs 2 (two) times daily. 60 each 3  . HUMALOG 100 UNIT/ML injection INJECT 0.15 MILLILITERS (15 UNITS) INTO THE SKIN 3 TIMES DAILY AS NEEDED FOR HIGH BLOOD SUGAR (Patient taking differently: INJECT 10-15 MILLILITERS (15 UNITS) INTO THE SKIN 3 TIMES DAILY AS NEEDED FOR HIGH BLOOD SUGAR) 10 mL  2  . ipratropium-albuterol (DUONEB) 0.5-2.5 (3) MG/3ML SOLN Take 3 mLs by nebulization 2 (two) times daily.    [name] Kitchen LANTUS 100 UNIT/ML injection INJECT 0.3MLS (30 UNITS TOTAL) INTO THE SKIN EVERY DAY 30 mL 3  . levothyroxine (SYNTHROID, LEVOTHROID) 88 MCG tablet TAKE ONE TABLET BY MOUTH EVERY DAY BEFORE BREAKFAST (Patient taking differently: TAKE ONE TABLET BY MOUTH EVERY DAY IN THE EVENING) 90 tablet 1  . losartan (COZAAR) 25 MG tablet Take 1 tablet by mouth daily.    . nortriptyline (PAMELOR) 25 MG capsule TAKE 1 CAPSULE BY MOUTH AT BEDTIME 30 capsule 5  . nortriptyline (PAMELOR) 75 MG capsule TAKE 1 CAPSULE BY MOUTH AT BEDTIME 30 capsule 5  . nystatin (NYSTATIN) powder Apply 1 Bottle topically 2 (two) times daily as needed (yeast infection). 15 g 2  . oxyCODONE (OXY IR/ROXICODONE) 5 MG immediate release tablet Take 5 mg by mouth 2 (two) times daily as needed for breakthrough pain. Take 1 tablet by mouth twice daily as needed    . OXYGEN O2 2lpm with sleep and 2.5 lpm with exertion  [employer]    . pravastatin (PRAVACHOL) 20 MG tablet TAKE ONE TABLET BY MOUTH EVERY DAY 90 tablet 0  . SPIRIVA HANDIHALER 18 MCG inhalation capsule Take 1 puff by mouth daily.    Haig Prophet  COMFORT INSULIN SYRINGE 30G X 1/2" 1 ML MISC USE FOUR (4) TIMES DAILY AS DIRECTED 100 each 3  . tiZANidine (ZANAFLEX) 2 MG tablet Take 2-4 mg by mouth every 12 (twelve) hours as needed for muscle spasms.    . Tiotropium Bromide-Olodaterol (STIOLTO RESPIMAT) 2.5-2.5 MCG/ACT AERS Inhale 2 puffs into the lungs daily. (Patient not taking: Reported on 05/18/2017) 1 Inhaler 11   No current facility-administered medications on file prior to visit.     Allergies  Allergen Reactions  . Gabapentin Anaphylaxis  . Lyrica [Pregabalin] Shortness Of Breath    Trouble breathing  . Ketorolac Tromethamine Hives  . Lisinopril Cough    Family History  Problem Relation Age of Onset  . Heart attack Mother 68       Deceased  . Heart disease Mother     . Emphysema Mother   . Alcoholism Mother   . COPD Father 18       Deceased  . Emphysema Father   . Alcoholism Father   . Esophageal varices Father   . Alcoholism Paternal Grandfather   . Diabetes Maternal Grandmother   . Heart disease Maternal Grandmother   . Lung cancer Maternal Grandfather   . Emphysema Maternal Grandfather   . Brain cancer Maternal Aunt   . Diabetes Sister   . Heart defect Sister   . Obesity Son   . Breast cancer Maternal Aunt     Social History   Socioeconomic History  . Marital status: Single    Spouse name: None  . Number of children: None  . Years of education: None  . Highest education level: None  Social Needs  . Financial resource strain: None  . Food insecurity - worry: None  . Food insecurity - inability: None  . Transportation needs - medical: None  . Transportation needs - non-medical: None  Occupational History  . None  Tobacco Use  . Smoking status: Former Smoker    Packs/day: 2.50    Years: 45.00    Pack years: 112.50    Types: Cigarettes    Last attempt to quit: 07/21/2016    Years since quitting: 0.8  . Smokeless tobacco: Never Used  Substance and Sexual Activity  . Alcohol use: No  . Drug use: No  . Sexual activity: No  Other Topics Concern  . None  Social History Narrative  . None   Review of Systems - See HPI.  All other ROS are negative.  BP 122/70   Pulse 89   Temp 97.9 F (36.6 C) (Oral)   Resp 16   Ht 5\' 4"  (1.626 m)   Wt 283 lb (128.4 kg)   SpO2 95% Comment: 2L  BMI 48.58 kg/m   Physical Exam  Constitutional: She is oriented to person, place, and time and well-developed, well-nourished, and in no distress.  HENT:  Head: Normocephalic and atraumatic.  Eyes: Conjunctivae are normal.  Neck: Neck supple.  Cardiovascular: Normal rate, regular rhythm, normal heart sounds and intact distal pulses.  Pulmonary/Chest: Effort normal.  Neurological: She is alert and oriented to person, place, and time.  Skin:  Skin is warm.  Psychiatric: Affect normal.  Vitals reviewed.  Recent Results (from the past 2160 hour(s))  ECHOCARDIOGRAM COMPLETE     Status: Abnormal   Collection Time: 05/05/17  1:57 PM  Result Value Ref Range   AV vel 1.41    LV PW d 13.2 (A) 0.6 - 1.1 mm   FS 25 (A) 28 - 44 %  LA vol 62 mL   LA ID, A-P, ES 43 mm   IVS/LV PW RATIO, ED 1.12    LVOT VTI 18.8 cm   LV e' LATERAL 10.3 cm/s   LV E/e' medial 11.65    LV E/e'average 11.65    AV pk vel 227 cm/s   AV Area VTI index .63 cm2/m2   AV Area VTI 1.16 cm2   AV VEL mean LVOT/AV .35    AV Area mean vel 1.11 cm2   AV area mean vel ind .49 cm2/m2   LA diam index 1.92 cm/m2   LA vol A4C 49 ml   Mean grad 11 mmHg   Valve area 1.41 cm2   LVOT peak grad rest 3 mmHg   E decel time 183 msec   LVOT diameter 20 mm   LVOT area 3.14 cm2   LVOT peak vel 83.5 cm/s   LVOT peak VTI .45 cm   Ao pk vel .37 m/s   VTI 42 cm   LVOT SV 59.00 mL   Peak grad 21 mmHg   Peak grad 6 mmHg   E/e' ratio 11.65    AO mean calculated velocity dopler 154 cm/s   MV pk E vel 120 m/s   MV pk A vel 149 m/s   LA vol index 27.7 mL/m2   Valve area index .63    AV peak Index .35    MV Dec 183    LA diam end sys 43.00 mm   TDI e' medial 7.90    TDI e' lateral 10.30    Assessment/Plan: 1. Wound of left lower extremity, subsequent encounter Wound care referral placed. Wound care RN scheduled for tomorrow. Start Keflex BID. Follow-up 1 week. - cephALEXin (KEFLEX) 500 MG capsule; Take 1 capsule (500 mg total) by mouth 2 (two) times daily for 10 days.  Dispense: 20 capsule; Refill: 0 - AMB referral to wound care center - Basic metabolic panel  2. Peripheral edema Present despite Lasix. She has been on this for some time. Will switch to Demadex. Supportive measures reviewed. BMP today. - torsemide (DEMADEX) 20 MG tablet; Take 1 tablet (20 mg total) by mouth 2 (two) times daily.  Dispense: 30 tablet; Refill: 0 - Basic metabolic panel   [name], PA-C

## 2017-05-18 NOTE — Telephone Encounter (Signed)
Copied from [place]. Topic: General - Other >> May 18, 2017 11:55 AM Neva Seat wrote: [facility] [phone]  Called to say start of care for nursing got hung up/delayed.  They received it this morning.  It will start tomorrow, Thurs 05-19-17. Will start to

## 2017-05-20 ENCOUNTER — Telehealth: Payer: Self-pay | Admitting: Physician Assistant

## 2017-05-20 ENCOUNTER — Other Ambulatory Visit: Payer: Self-pay | Admitting: Physician Assistant

## 2017-05-20 NOTE — Telephone Encounter (Signed)
Attempted to contact jennifer from [facility]. VM was generic that gave no acknowledgement of who the phone number was associated with.  I will try to reach back out to her later today.

## 2017-05-20 NOTE — Telephone Encounter (Signed)
Verbal order was given to [facility].

## 2017-05-20 NOTE — Telephone Encounter (Signed)
Ok to give verbal order. A referral has been placed to Wound clinic as well at her last visit earlier this week.

## 2017-05-20 NOTE — Telephone Encounter (Signed)
Copied from [facility] [id number]. Topic: Quick Communication - See Telephone Encounter >> May 20, 2017  8:16 AM Robina Ade, Helene Kelp D wrote: CRM for notification. See Telephone encounter for: 05/20/17. Minerva Fester from [facility] call and would like an order for 2X for 1 weel and 3X for 3 weeks. Patient also need to go to Wound Clinic appointment. Please call Anderson Malta back [phone].

## 2017-05-24 ENCOUNTER — Telehealth: Payer: Self-pay | Admitting: Physician Assistant

## 2017-05-24 DIAGNOSIS — S81812D Laceration without foreign body, left lower leg, subsequent encounter: Secondary | ICD-10-CM | POA: Diagnosis not present

## 2017-05-24 NOTE — Telephone Encounter (Signed)
Received a packet of [employer] Certification paperwork for [employer] Placed in bin up front with charge sheet.

## 2017-05-24 NOTE — Telephone Encounter (Signed)
Reviewed. Will forward to supervising MD for co-signature and fax in.

## 2017-05-24 NOTE — Telephone Encounter (Signed)
Home Health certification paperwork in your bin for signature.

## 2017-05-25 ENCOUNTER — Encounter: Payer: Self-pay | Admitting: Physician Assistant

## 2017-05-25 ENCOUNTER — Ambulatory Visit (INDEPENDENT_AMBULATORY_CARE_PROVIDER_SITE_OTHER): Payer: Medicare Other | Admitting: Physician Assistant

## 2017-05-25 VITALS — BP 122/80 | HR 84 | Temp 97.5°F | Resp 14 | Ht 64.0 in | Wt 277.0 lb

## 2017-05-25 DIAGNOSIS — Z794 Long term (current) use of insulin: Secondary | ICD-10-CM | POA: Diagnosis not present

## 2017-05-25 DIAGNOSIS — E119 Type 2 diabetes mellitus without complications: Secondary | ICD-10-CM

## 2017-05-25 DIAGNOSIS — L03116 Cellulitis of left lower limb: Secondary | ICD-10-CM | POA: Diagnosis not present

## 2017-05-25 LAB — COMPREHENSIVE METABOLIC PANEL
ALT: 8 U/L (ref 0–35)
AST: 12 U/L (ref 0–37)
Albumin: 3.8 g/dL (ref 3.5–5.2)
Alkaline Phosphatase: 76 U/L (ref 39–117)
BILIRUBIN TOTAL: 0.5 mg/dL (ref 0.2–1.2)
BUN: 23 mg/dL (ref 6–23)
CALCIUM: 8.7 mg/dL (ref 8.4–10.5)
CHLORIDE: 92 meq/L — AB (ref 96–112)
CO2: 39 meq/L — AB (ref 19–32)
CREATININE: 1.41 mg/dL — AB (ref 0.40–1.20)
GFR: 39.93 mL/min — AB (ref >60.00)
GLUCOSE: 76 mg/dL (ref 70–99)
Potassium: 3.6 mEq/L (ref 3.5–5.1)
Sodium: 137 mEq/L (ref 135–145)
Total Protein: 6.5 g/dL (ref 6.0–8.3)

## 2017-05-25 LAB — HEMOGLOBIN A1C: HEMOGLOBIN A1C: 6 % (ref 4.6–6.5)

## 2017-05-25 NOTE — Progress Notes (Signed)
Patient presents to clinic today for 1 week follow-up of cellulitis of LLE. At last visit, patient was started on Keflex BID while at the same time had Furosemide changed to Torsemide for better diuresis. [facility] wound RN is coming to the house three times weekly. Is taking antibiotic and Torsemide as directed. Noted improving symptoms daily. Denies residual pain or redness. Denies fever, chills, malaise or fatigue.[name] Kitchen   Past Medical History:  Diagnosis Date  . Abdominal mass of other site   . Cervical compression fracture ([place])   . Chronic kidney disease, stage 3 (HCC)    Borderline Stage 2-3  . Chronic lower limb pain   . COPD (chronic obstructive pulmonary disease) ([place])   . CTS (carpal tunnel syndrome)   . Depression   . Diabetes ([place])    Type II  . Fibromyalgia   . Hepatitis C   . Hypercholesteremia   . Hypertension   . Hypothyroidism   . Morbid obesity ([place])   . Neuropathy    Diabetes  . OSA (obstructive sleep apnea)   . Osteoarthritis   . Renal cancer ([place])    Left Kidney Removed  . RLS (restless legs syndrome)   . Syncope   . Venous stasis     Current Outpatient Medications on File Prior to Visit  Medication Sig Dispense Refill  . albuterol (PROVENTIL HFA;VENTOLIN HFA) 108 (90 Base) MCG/ACT inhaler Inhale 1-2 puffs into the lungs every 6 (six) hours as needed for wheezing or shortness of breath. 6.7 g 5  . cephALEXin (KEFLEX) 500 MG capsule Take 1 capsule (500 mg total) by mouth 2 (two) times daily for 10 days. 20 capsule 0  . CHANTIX 1 MG tablet TAKE ONE TABLET BY MOUTH TWICE DAILY 60 tablet 0  . EMBEDA 30-1.2 MG CPCR Take 1 capsule by mouth 2 (two) times daily. Take 1 capsule by mouth twice daily    . Fluticasone-Salmeterol (ADVAIR DISKUS) 500-50 MCG/DOSE AEPB Inhale 1 puff into the lungs 2 (two) times daily. 60 each 3  . HUMALOG 100 UNIT/ML injection INJECT 0.15 MILLILITERS (15 UNITS) INTO THE SKIN 3 TIMES DAILY AS NEEDED FOR HIGH BLOOD SUGAR (Patient taking differently:  INJECT 10-15 MILLILITERS (15 UNITS) INTO THE SKIN 3 TIMES DAILY AS NEEDED FOR HIGH BLOOD SUGAR) 10 mL 2  . ipratropium-albuterol (DUONEB) 0.5-2.5 (3) MG/3ML SOLN Take 3 mLs by nebulization 2 (two) times daily.    [name] Kitchen LANTUS 100 UNIT/ML injection INJECT 0.3MLS (30 UNITS TOTAL) INTO THE SKIN EVERY DAY 30 mL 3  . levothyroxine (SYNTHROID, LEVOTHROID) 88 MCG tablet TAKE ONE TABLET BY MOUTH EVERY DAY BEFORE BREAKFAST 90 tablet 1  . losartan (COZAAR) 25 MG tablet Take 1 tablet by mouth daily.    . nortriptyline (PAMELOR) 25 MG capsule TAKE 1 CAPSULE BY MOUTH AT BEDTIME 30 capsule 5  . nortriptyline (PAMELOR) 75 MG capsule TAKE 1 CAPSULE BY MOUTH AT BEDTIME 30 capsule 5  . nystatin (NYSTATIN) powder Apply 1 Bottle topically 2 (two) times daily as needed (yeast infection). 15 g 2  . oxyCODONE (OXY IR/ROXICODONE) 5 MG immediate release tablet Take 5 mg by mouth 2 (two) times daily as needed for breakthrough pain. Take 1 tablet by mouth twice daily as needed    . OXYGEN O2 2lpm with sleep and 2.5 lpm with exertion  [facility]    . pravastatin (PRAVACHOL) 20 MG tablet TAKE ONE TABLET BY MOUTH EVERY DAY 90 tablet 0  . SPIRIVA HANDIHALER 18 MCG inhalation capsule Take 1  puff by mouth daily.    Haig Prophet COMFORT INSULIN SYRINGE 30G X 1/2" 1 ML MISC USE FOUR (4) TIMES DAILY AS DIRECTED 100 each 3  . Tiotropium Bromide-Olodaterol (STIOLTO RESPIMAT) 2.5-2.5 MCG/ACT AERS Inhale 2 puffs into the lungs daily. 1 Inhaler 11  . tiZANidine (ZANAFLEX) 2 MG tablet Take 2-4 mg by mouth every 12 (twelve) hours as needed for muscle spasms.    [name] Kitchen torsemide (DEMADEX) 20 MG tablet Take 1 tablet (20 mg total) by mouth 2 (two) times daily. 30 tablet 0   No current facility-administered medications on file prior to visit.     Allergies  Allergen Reactions  . Gabapentin Anaphylaxis  . Lyrica [Pregabalin] Shortness Of Breath    Trouble breathing  . Ketorolac Tromethamine Hives  . Lisinopril Cough    Family History  Problem  Relation Age of Onset  . Heart attack Mother 20       Deceased  . Heart disease Mother   . Emphysema Mother   . Alcoholism Mother   . COPD Father 54       Deceased  . Emphysema Father   . Alcoholism Father   . Esophageal varices Father   . Alcoholism Paternal Grandfather   . Diabetes Maternal Grandmother   . Heart disease Maternal Grandmother   . Lung cancer Maternal Grandfather   . Emphysema Maternal Grandfather   . Brain cancer Maternal Aunt   . Diabetes Sister   . Heart defect Sister   . Obesity Son   . Breast cancer Maternal Aunt     Social History   Socioeconomic History  . Marital status: Single    Spouse name: None  . Number of children: None  . Years of education: None  . Highest education level: None  Social Needs  . Financial resource strain: None  . Food insecurity - worry: None  . Food insecurity - inability: None  . Transportation needs - medical: None  . Transportation needs - non-medical: None  Occupational History  . None  Tobacco Use  . Smoking status: Former Smoker    Packs/day: 2.50    Years: 45.00    Pack years: 112.50    Types: Cigarettes    Last attempt to quit: 07/21/2016    Years since quitting: 0.8  . Smokeless tobacco: Never Used  Substance and Sexual Activity  . Alcohol use: No  . Drug use: No  . Sexual activity: No  Other Topics Concern  . None  Social History Narrative  . None    Review of Systems - See HPI.  All other ROS are negative.  BP 122/80   Pulse 84   Temp (!) 97.5 F (36.4 C) (Oral)   Resp 14   Ht _0  (1.626 m)   Wt 277 lb (125.6 kg)   SpO2 94% Comment: 2 L  BMI 47.55 kg/m   Physical Exam  Recent Results (from the past 2160 hour(s))  ECHOCARDIOGRAM COMPLETE     Status: Abnormal   Collection Time: 05/05/17  1:57 PM  Result Value Ref Range   AV vel 1.41    LV PW d 13.2 (A) 0.6 - 1.1 mm   FS 25 (A) 28 - 44 %   LA vol 62 mL   LA ID, A-P, ES 43 mm   IVS/LV PW RATIO, ED 1.12    LVOT VTI 18.8 cm   LV  e' LATERAL 10.3 cm/s   LV E/e' medial 11.65    LV E/e'average  11.65    AV pk vel 227 cm/s   AV Area VTI index .63 cm2/m2   AV Area VTI 1.16 cm2   AV VEL mean LVOT/AV .35    AV Area mean vel 1.11 cm2   AV area mean vel ind .49 cm2/m2   LA diam index 1.92 cm/m2   LA vol A4C 49 ml   Mean grad 11 mmHg   Valve area 1.41 cm2   LVOT peak grad rest 3 mmHg   E decel time 183 msec   LVOT diameter 20 mm   LVOT area 3.14 cm2   LVOT peak vel 83.5 cm/s   LVOT peak VTI .45 cm   Ao pk vel .37 m/s   VTI 42 cm   LVOT SV 59.00 mL   Peak grad 21 mmHg   Peak grad 6 mmHg   E/e' ratio 11.65    AO mean calculated velocity dopler 154 cm/s   MV pk E vel 120 m/s   MV pk A vel 149 m/s   LA vol index 27.7 mL/m2   Valve area index .63    AV peak Index .35    MV Dec 183    LA diam end sys 43.00 mm   TDI e' medial 7.90    TDI e' lateral 67.20   Basic metabolic panel     Status: Abnormal   Collection Time: 05/18/17  1:43 PM  Result Value Ref Range   Sodium 138 135 - 145 mEq/L   Potassium 4.4 3.5 - 5.1 mEq/L   Chloride 97 96 - 112 mEq/L   CO2 34 (H) 19 - 32 mEq/L   Glucose, Bld 67 (L) 70 - 99 mg/dL   BUN 22 6 - 23 mg/dL   Creatinine, Ser 1.27 (H) 0.40 - 1.20 mg/dL   Calcium 8.7 8.4 - 10.5 mg/dL   GFR 45.05 (L) >60.00 mL/min    Assessment/Plan: 1. Diabetes mellitus, type II, insulin dependent (HCC) Previously very well-controlled. Defers foot exam. Will obtain next visit. Labs today as noted below as patient overdue for repeat.  - Hemoglobin A1c - Comp Met (CMET)  2. Cellulitis of left lower extremity Resolving. Continue wound care. Complete entire course of antibiotic. Decrease Torsemide to once daily. Will check BMP today. Strict return precautions reviewed.   [name], PA-C

## 2017-05-25 NOTE — Patient Instructions (Signed)
Please go to the lab today for blood work.  I will call you with your results. We will alter treatment regimen(s) if indicated by your results.   Please continue medications as directed, with the following exception: - decrease Torsemide to once daily.   Continue care with wound nursing. I am checking on your appointment with the wound specialist.

## 2017-05-26 ENCOUNTER — Other Ambulatory Visit: Payer: Self-pay | Admitting: Physician Assistant

## 2017-05-26 DIAGNOSIS — R609 Edema, unspecified: Secondary | ICD-10-CM

## 2017-05-26 MED ORDER — TORSEMIDE 20 MG PO TABS: 20.0000 mg | ORAL_TABLET | Freq: Every day | ORAL | 0 refills | Status: DC

## 2017-06-08 ENCOUNTER — Encounter (HOSPITAL_BASED_OUTPATIENT_CLINIC_OR_DEPARTMENT_OTHER): Payer: Medicare Other | Attending: Physician Assistant

## 2017-06-08 DIAGNOSIS — Z87891 Personal history of nicotine dependence: Secondary | ICD-10-CM | POA: Diagnosis not present

## 2017-06-08 DIAGNOSIS — L97822 Non-pressure chronic ulcer of other part of left lower leg with fat layer exposed: Secondary | ICD-10-CM | POA: Insufficient documentation

## 2017-06-08 DIAGNOSIS — M7989 Other specified soft tissue disorders: Secondary | ICD-10-CM | POA: Diagnosis not present

## 2017-06-08 DIAGNOSIS — Z905 Acquired absence of kidney: Secondary | ICD-10-CM | POA: Diagnosis not present

## 2017-06-08 DIAGNOSIS — G4730 Sleep apnea, unspecified: Secondary | ICD-10-CM | POA: Insufficient documentation

## 2017-06-08 DIAGNOSIS — E1140 Type 2 diabetes mellitus with diabetic neuropathy, unspecified: Secondary | ICD-10-CM | POA: Insufficient documentation

## 2017-06-08 DIAGNOSIS — Z6841 Body Mass Index (BMI) 40.0 and over, adult: Secondary | ICD-10-CM | POA: Insufficient documentation

## 2017-06-08 DIAGNOSIS — N183 Chronic kidney disease, stage 3 (moderate): Secondary | ICD-10-CM | POA: Insufficient documentation

## 2017-06-08 DIAGNOSIS — Z85528 Personal history of other malignant neoplasm of kidney: Secondary | ICD-10-CM | POA: Insufficient documentation

## 2017-06-08 DIAGNOSIS — E1122 Type 2 diabetes mellitus with diabetic chronic kidney disease: Secondary | ICD-10-CM | POA: Insufficient documentation

## 2017-06-08 DIAGNOSIS — J449 Chronic obstructive pulmonary disease, unspecified: Secondary | ICD-10-CM | POA: Insufficient documentation

## 2017-06-08 DIAGNOSIS — E11622 Type 2 diabetes mellitus with other skin ulcer: Secondary | ICD-10-CM | POA: Insufficient documentation

## 2017-06-08 DIAGNOSIS — Z794 Long term (current) use of insulin: Secondary | ICD-10-CM | POA: Insufficient documentation

## 2017-06-08 DIAGNOSIS — I129 Hypertensive chronic kidney disease with stage 1 through stage 4 chronic kidney disease, or unspecified chronic kidney disease: Secondary | ICD-10-CM | POA: Insufficient documentation

## 2017-06-10 ENCOUNTER — Encounter: Payer: Self-pay | Admitting: Internal Medicine

## 2017-06-10 ENCOUNTER — Ambulatory Visit: Payer: Medicare Other | Admitting: Internal Medicine

## 2017-06-10 VITALS — BP 108/62 | HR 97 | Ht 64.0 in | Wt 281.4 lb

## 2017-06-10 DIAGNOSIS — I2781 Cor pulmonale (chronic): Secondary | ICD-10-CM

## 2017-06-10 DIAGNOSIS — J449 Chronic obstructive pulmonary disease, unspecified: Secondary | ICD-10-CM | POA: Diagnosis not present

## 2017-06-10 DIAGNOSIS — J9612 Chronic respiratory failure with hypercapnia: Secondary | ICD-10-CM

## 2017-06-10 DIAGNOSIS — J9611 Chronic respiratory failure with hypoxia: Secondary | ICD-10-CM

## 2017-06-10 MED ORDER — GLYCOPYRROLATE-FORMOTEROL 9-4.8 MCG/ACT IN AERO: 2.0000 | INHALATION_SPRAY | Freq: Two times a day (BID) | RESPIRATORY_TRACT | 0 refills | Status: DC

## 2017-06-10 NOTE — Patient Instructions (Signed)
Plan A = Automatic = Bevespi Take 2 puffs first thing in am and then another 2 puffs about 12 hours later and if insurance doesn't cover or you don't like it go back to advair/spriva  Work on inhaler technique:  relax and gently blow all the way out then take a nice smooth deep breath back in, triggering the inhaler at same time you start breathing in.  Hold for up to 5 seconds if you can. Blow out thru nose. Rinse and gargle with water when done      Plan B = Backup Only use your albuterol as a rescue medication to be used if you can't catch your breath by resting or doing a relaxed purse lip breathing pattern.  - The less you use it, the better it will work when you need it. - Ok to use the inhaler up to 2 puffs  every 4 hours if you must but call for appointment if use goes up over your usual need - Don't leave home without it !!  (think of it like the spare tire for your car)   Plan C = Crisis - only use your albuterol nebulizer if you first try Plan B and it fails to help > ok to use the nebulizer up to every 4 hours but if start needing it regularly call for immediate appointment   Please schedule a follow up visit in 3 months but call sooner if needed to See [name] NP on return and bring your drug forumulary and all your inhalers and neb solutions

## 2017-06-10 NOTE — Progress Notes (Signed)
Subjective:    Patient ID: Marlane Noble, female    DOB: [birth year],    MRN: [id number]    Brief patient profile:  61 yowf quit smoking 07/2016   referred by [name] to pulmonary clinic for copd evaluation in 12/2013  With GOLD III criteria and referred back  04/28/2017      04/28/2017  New pt ov/Renee Noble re:re- establish for eval of sob/ ? Clear for surgery / maint rx = spiriva dpi Chief Complaint  Patient presents with  . Pulmonary Consult    Referred by [name], PA for eval of pulmonary hypertension. Pt states she also needs clearance for hernia repair surgery.  She c/o occ non prod cough. She states her breathing is doing well today. She rarely uses her rescue inhaler.    02 sat at rest RA = low 90s Also wearing 02 2.5 lpm hs Walking x 16ft on 02 = 2.5lpm may be 100 ft slow pace = [facility] = can't walk 100 yards even at a slow pace at a flat grade s stopping due to sob  / may be uses saba hfa once a month, almost never noct unless flaring with uri  Sleeping one- 2 pillows on cpap 2.5 lpm  Losing wt voluntarily hoping to have abd wall hernia surgery 2019  rec Start stiolto 2 pffs each am  Please see patient coordinator before you leave today  to schedule 2d echo and overnight 02 sat on cpap and your 02 2.5lpm  Please schedule a follow up office visit in 6 weeks, call sooner if needed  - add need to clarify who is following cpap  > PA doctor 47 y prior to OV  / 16 /2lpm    06/10/2017  f/u ov/Renee Noble re: back to spiriva /advair  2lpm 24/7 x does 3lp pulsed portable / better sobon stiolto but not covered  Chief Complaint  Patient presents with  . Follow-up    feels like her breathing,lungs are better,denies SOB,coughing,use O2 2.5L continuous O2  has not needed saba but doe still  = MMRC3 = can't walk 100 yards even at a slow pace at a flat grade s stopping due to sob   Sleeping ok on 2.5 lpm   No obvious day to day or daytime variability or assoc excess/ purulent sputum or mucus  plugs or hemoptysis or cp or chest tightness, subjective wheeze or overt sinus or hb symptoms. No unusual exposure hx or h/o childhood pna/ asthma or knowledge of premature birth.  Sleeping ok 2pillows on 2.5 lpm and cpap 16  without nocturnal  or early am exacerbation  of respiratory  c/o's or need for noct saba. Also denies any obvious fluctuation of symptoms with weather or environmental changes or other aggravating or alleviating factors except as outlined above   Current Allergies, Complete Past Medical History, Past Surgical History, Family History, and Social History were reviewed in [employer] record.  ROS  The following are not active complaints unless bolded Hoarseness, sore throat, dysphagia, dental problems, itching, sneezing,  nasal congestion or discharge of excess mucus or purulent secretions, ear ache,   fever, chills, sweats, unintended wt loss or wt gain, classically pleuritic or exertional cp,  orthopnea pnd or leg swelling, presyncope, palpitations, abdominal pain, anorexia, nausea, vomiting, diarrhea  or change in bowel habits or change in bladder habits, change in stools or change in urine, dysuria, hematuria,  rash, arthralgias, visual complaints, headache, numbness, weakness or ataxia or problems with  walking or coordination,  change in mood/affect or memory.        Current Meds  Medication Sig  . albuterol (PROVENTIL HFA;VENTOLIN HFA) 108 (90 Base) MCG/ACT inhaler Inhale 1-2 puffs into the lungs every 6 (six) hours as needed for wheezing or shortness of breath.  . EMBEDA 30-1.2 MG CPCR Take 1 capsule by mouth 2 (two) times daily. Take 1 capsule by mouth twice daily  . Fluticasone-Salmeterol (ADVAIR DISKUS) 500-50 MCG/DOSE AEPB Inhale 1 puff into the lungs 2 (two) times daily.  [name] Kitchen HUMALOG 100 UNIT/ML injection INJECT 0.15 MILLILITERS (15 UNITS) INTO THE SKIN 3 TIMES DAILY AS NEEDED FOR HIGH BLOOD SUGAR (Patient taking differently: INJECT 10-15 MILLILITERS  (15 UNITS) INTO THE SKIN 3 TIMES DAILY AS NEEDED FOR HIGH BLOOD SUGAR)  . ipratropium-albuterol (DUONEB) 0.5-2.5 (3) MG/3ML SOLN Take 3 mLs by nebulization 2 (two) times daily.  [name] Kitchen LANTUS 100 UNIT/ML injection INJECT 0.3MLS (30 UNITS TOTAL) INTO THE SKIN EVERY DAY  . levothyroxine (SYNTHROID, LEVOTHROID) 88 MCG tablet TAKE ONE TABLET BY MOUTH EVERY DAY BEFORE BREAKFAST  . losartan (COZAAR) 25 MG tablet Take 1 tablet by mouth daily.  . nortriptyline (PAMELOR) 25 MG capsule TAKE 1 CAPSULE BY MOUTH AT BEDTIME  . nortriptyline (PAMELOR) 75 MG capsule TAKE 1 CAPSULE BY MOUTH AT BEDTIME  . nystatin (NYSTATIN) powder Apply 1 Bottle topically 2 (two) times daily as needed (yeast infection).  [name] Kitchen oxyCODONE (OXY IR/ROXICODONE) 5 MG immediate release tablet Take 5 mg by mouth 2 (two) times daily as needed for breakthrough pain. Take 1 tablet by mouth twice daily as needed  . OXYGEN O2 2lpm with sleep and 2.5 lpm with exertion  [facility]  . pravastatin (PRAVACHOL) 20 MG tablet TAKE ONE TABLET BY MOUTH EVERY DAY  . SPIRIVA HANDIHALER 18 MCG inhalation capsule Take 1 puff by mouth daily.  Haig Prophet COMFORT INSULIN SYRINGE 30G X 1/2" 1 ML MISC USE FOUR (4) TIMES DAILY AS DIRECTED  . tiZANidine (ZANAFLEX) 2 MG tablet Take 2-4 mg by mouth every 12 (twelve) hours as needed for muscle spasms.  [name] Kitchen torsemide (DEMADEX) 20 MG tablet Take 1 tablet (20 mg total) by mouth daily.  . [DISCONTINUED] CHANTIX 1 MG tablet TAKE ONE TABLET BY MOUTH TWICE DAILY                             Objective:   Physical Exam   amb obese pleasant wf nad  06/10/2017       281   04/28/17 276 lb (125.2 kg)  04/23/17 279 lb (126.6 kg)  04/21/17 279 lb (126.6 kg)     Vital signs reviewed - Note on arrival 02 sats  93% on 2lpm      HEENT: nl  turbinates bilaterally, and oropharynx. Nl external ear canals without cough reflex - full dentures  Modified Mallampati Score =   1   NECK :  without JVD/Nodes/TM/ nl carotid upstrokes  bilaterally   LUNGS: no acc muscle use,  Nl contour chest distant bs bilaterally / no wheeze or cough on exp   CV:  RRR  no s3 or murmur or increase in P2, and decreased pitting edema both legs vs prev eval   ABD:  soft and nontender with nl inspiratory excursion in the supine position. 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           Assessment & Plan:

## 2017-06-11 ENCOUNTER — Encounter: Payer: Self-pay | Admitting: Internal Medicine

## 2017-06-11 MED ORDER — GLYCOPYRROLATE-FORMOTEROL 9-4.8 MCG/ACT IN AERO: 2.0000 | INHALATION_SPRAY | Freq: Two times a day (BID) | RESPIRATORY_TRACT | 11 refills | Status: DC

## 2017-06-11 NOTE — Assessment & Plan Note (Signed)
Body mass index is 48.3 kg/m.  -  Trending up  Lab Results  Component Value Date   TSH 2.39 09/28/2016     Contributing to gerd risk/ doe/reviewed the need and the process to achieve and maintain neg calorie balance > defer f/u primary care including intermittently monitoring thyroid status

## 2017-06-11 NOTE — Assessment & Plan Note (Addendum)
HC03   1/919  = 39   Much better compensated now on 2.5 lpm 24/7 and on using cpap hs and 3lpm with activity

## 2017-06-11 NOTE — Assessment & Plan Note (Addendum)
Trial off advair 12/25/13 as not using it consistently anyway> pt restarted around 02/03/14   - 02/14/2014 PFTs  FEV1  1.22 (48%) ratio 53 p 12% improvement from SABA and dlco 73% corrects to 84  - 02/15/2014  Walked RA  2 laps @ 185 ft each stopped due to  89% /   nl pace / legs gave out same time chest got "tight" relieved immediately at rest  - Spirometry 04/28/2017  FEV1 0.74 (30%)  Ratio 47 with classic curvature p spiriva dpi in am  - 04/28/2017    try stiolto 2 each am  > improved but not on formulary   - 06/10/2017  After extensive coaching inhaler device  effectiveness =    75% from baseline 25% > try bevespi 2bid   Continue to feel  Pt is Group B in terms of symptom/risk and laba/lama therefore appropriate rx at this point.     Formulary restrictions will be an ongoing challenge for the forseable future and I would be happy to pick an alternative if the pt will first  provide me a list of them but pt  will need to return here for training for any new device that is required eg dpi vs hfa vs respimat.    In meantime we can always provide samples so the patient never runs out of any needed respiratory medications.   For now try bevespi and if this is not covered bring back to train on anoro    I had an extended discussion with the patient reviewing all relevant studies completed to date and  lasting 15 to 20 minutes of a 25 minute visit    Each maintenance medication was reviewed in detail including most importantly the difference between maintenance and prns and under what circumstances the prns are to be triggered using an action plan format that is not reflected in the computer generated alphabetically organized AVS.    Please see AVS for specific instructions unique to this visit that I personally wrote and verbalized to the the pt in detail and then reviewed with pt  by my nurse highlighting any  changes in therapy recommended at today's visit to their plan of care.

## 2017-06-11 NOTE — Assessment & Plan Note (Addendum)
Echo 05/05/2017 >>>  - Left ventricle: The cavity size was normal. Wall thickness was increased in a pattern of mild LVH. Systolic function was normal. The estimated ejection fraction was in the range of 55% to 60%. Wall motion was normal; there were no regional wall motion abnormalities. Doppler parameters are consistent with abnormal left ventricular relaxation (grade 1 diastolic dysfunction). - Aortic valve: There was very mild stenosis. Valve area (VTI): 1.41 cm^2. Valve area (Vmax): 1.16 cm^2. Valve area (Vmean): 1.11 cm^2. - Mitral valve: Small gradient in diastole across mitral valve with mean gradient smaller than that recorded in 2017 likely mild functional stenosis Severely calcified annulus. Severely thickened, severely calcified leaflets . - Left atrium: The atrium was mildly dilated. - Atrial septum: No defect or patent foramen ovale was identified. ONO on cpap  2lpm  04/29/2017 >>>  Requested 06/10/2017   rx is treat the underlying conditions causing it, not a candidate for [place] rx

## 2017-06-13 ENCOUNTER — Telehealth: Payer: Self-pay | Admitting: *Deleted

## 2017-06-13 DIAGNOSIS — J9611 Chronic respiratory failure with hypoxia: Secondary | ICD-10-CM

## 2017-06-13 DIAGNOSIS — J9612 Chronic respiratory failure with hypercapnia: Principal | ICD-10-CM

## 2017-06-13 NOTE — Telephone Encounter (Signed)
-----   Message from Harland German sent at 06/13/2017 12:22 PM EST ----- Regarding: RE: Order [facility] ----- Message ----- From: Rosana Berger, CMA Sent: 06/13/2017  11:53 AM To: Harland German Subject: RE: Order                                      I can see it under "other orders" placed on 04/28/17 ----- Message ----- From: Harland German Sent: 06/13/2017  11:17 AM To: Rosana Berger, CMA Subject: Order                                          Jacqualin Combes see an order that was put in on this patient? ----- Message ----- From: Rosana Berger, CMA Sent: 06/13/2017  11:04 AM To: Harland German  Hey can you help me figure out who done her ONO? I can not tell by looking at the order, thanks

## 2017-06-14 ENCOUNTER — Other Ambulatory Visit: Payer: Self-pay | Admitting: Physician Assistant

## 2017-06-14 DIAGNOSIS — E119 Type 2 diabetes mellitus without complications: Secondary | ICD-10-CM

## 2017-06-14 DIAGNOSIS — Z794 Long term (current) use of insulin: Secondary | ICD-10-CM

## 2017-06-14 DIAGNOSIS — R609 Edema, unspecified: Secondary | ICD-10-CM

## 2017-06-15 DIAGNOSIS — E11622 Type 2 diabetes mellitus with other skin ulcer: Secondary | ICD-10-CM | POA: Diagnosis not present

## 2017-06-21 ENCOUNTER — Telehealth: Payer: Self-pay | Admitting: Internal Medicine

## 2017-06-21 NOTE — Telephone Encounter (Signed)
Called and spoke with Tamera Punt from the pharmacy. Advised her that per Dr. Melvyn Novas last [place] note patient was to try Charolotte Eke, however if her insurance didn't cover it or if it was too expensive she could switch back to advair/spiriva. Also if she felt like the bevespi wasn't working she could switch back. Patient is not supposed to take these at the same time. Pharmacist verbalized understanding.

## 2017-06-22 ENCOUNTER — Encounter (HOSPITAL_BASED_OUTPATIENT_CLINIC_OR_DEPARTMENT_OTHER): Payer: Medicare Other | Attending: Internal Medicine

## 2017-06-22 DIAGNOSIS — I129 Hypertensive chronic kidney disease with stage 1 through stage 4 chronic kidney disease, or unspecified chronic kidney disease: Secondary | ICD-10-CM | POA: Diagnosis not present

## 2017-06-22 DIAGNOSIS — Z6841 Body Mass Index (BMI) 40.0 and over, adult: Secondary | ICD-10-CM | POA: Diagnosis not present

## 2017-06-22 DIAGNOSIS — M7989 Other specified soft tissue disorders: Secondary | ICD-10-CM | POA: Insufficient documentation

## 2017-06-22 DIAGNOSIS — L97822 Non-pressure chronic ulcer of other part of left lower leg with fat layer exposed: Secondary | ICD-10-CM | POA: Insufficient documentation

## 2017-06-22 DIAGNOSIS — N183 Chronic kidney disease, stage 3 (moderate): Secondary | ICD-10-CM | POA: Diagnosis not present

## 2017-06-22 DIAGNOSIS — E11622 Type 2 diabetes mellitus with other skin ulcer: Secondary | ICD-10-CM | POA: Insufficient documentation

## 2017-06-22 DIAGNOSIS — E1122 Type 2 diabetes mellitus with diabetic chronic kidney disease: Secondary | ICD-10-CM | POA: Insufficient documentation

## 2017-06-28 ENCOUNTER — Encounter: Payer: Self-pay | Admitting: Internal Medicine

## 2017-06-29 DIAGNOSIS — E11622 Type 2 diabetes mellitus with other skin ulcer: Secondary | ICD-10-CM | POA: Diagnosis not present

## 2017-07-06 DIAGNOSIS — E11622 Type 2 diabetes mellitus with other skin ulcer: Secondary | ICD-10-CM | POA: Diagnosis not present

## 2017-07-13 DIAGNOSIS — E11622 Type 2 diabetes mellitus with other skin ulcer: Secondary | ICD-10-CM | POA: Diagnosis not present

## 2017-07-20 ENCOUNTER — Encounter (HOSPITAL_BASED_OUTPATIENT_CLINIC_OR_DEPARTMENT_OTHER): Payer: Medicare Other | Attending: Physician Assistant

## 2017-07-20 DIAGNOSIS — E1122 Type 2 diabetes mellitus with diabetic chronic kidney disease: Secondary | ICD-10-CM | POA: Insufficient documentation

## 2017-07-20 DIAGNOSIS — M797 Fibromyalgia: Secondary | ICD-10-CM | POA: Diagnosis not present

## 2017-07-20 DIAGNOSIS — Z6841 Body Mass Index (BMI) 40.0 and over, adult: Secondary | ICD-10-CM | POA: Diagnosis not present

## 2017-07-20 DIAGNOSIS — E11622 Type 2 diabetes mellitus with other skin ulcer: Secondary | ICD-10-CM | POA: Diagnosis present

## 2017-07-20 DIAGNOSIS — I129 Hypertensive chronic kidney disease with stage 1 through stage 4 chronic kidney disease, or unspecified chronic kidney disease: Secondary | ICD-10-CM | POA: Diagnosis not present

## 2017-07-20 DIAGNOSIS — Z794 Long term (current) use of insulin: Secondary | ICD-10-CM | POA: Insufficient documentation

## 2017-07-20 DIAGNOSIS — N189 Chronic kidney disease, unspecified: Secondary | ICD-10-CM | POA: Insufficient documentation

## 2017-07-20 DIAGNOSIS — N183 Chronic kidney disease, stage 3 (moderate): Secondary | ICD-10-CM | POA: Diagnosis not present

## 2017-07-20 DIAGNOSIS — L97822 Non-pressure chronic ulcer of other part of left lower leg with fat layer exposed: Secondary | ICD-10-CM | POA: Insufficient documentation

## 2017-07-20 DIAGNOSIS — Z87891 Personal history of nicotine dependence: Secondary | ICD-10-CM | POA: Diagnosis not present

## 2017-07-20 DIAGNOSIS — Z905 Acquired absence of kidney: Secondary | ICD-10-CM | POA: Insufficient documentation

## 2017-07-20 DIAGNOSIS — E039 Hypothyroidism, unspecified: Secondary | ICD-10-CM | POA: Insufficient documentation

## 2017-07-23 DIAGNOSIS — Z4800 Encounter for change or removal of nonsurgical wound dressing: Secondary | ICD-10-CM | POA: Diagnosis not present

## 2017-07-23 DIAGNOSIS — E1122 Type 2 diabetes mellitus with diabetic chronic kidney disease: Secondary | ICD-10-CM | POA: Diagnosis not present

## 2017-07-23 DIAGNOSIS — N183 Chronic kidney disease, stage 3 (moderate): Secondary | ICD-10-CM | POA: Diagnosis not present

## 2017-07-23 DIAGNOSIS — Z905 Acquired absence of kidney: Secondary | ICD-10-CM

## 2017-07-23 DIAGNOSIS — Z794 Long term (current) use of insulin: Secondary | ICD-10-CM

## 2017-07-23 DIAGNOSIS — E1140 Type 2 diabetes mellitus with diabetic neuropathy, unspecified: Secondary | ICD-10-CM | POA: Diagnosis not present

## 2017-07-23 DIAGNOSIS — I130 Hypertensive heart and chronic kidney disease with heart failure and stage 1 through stage 4 chronic kidney disease, or unspecified chronic kidney disease: Secondary | ICD-10-CM | POA: Diagnosis not present

## 2017-07-23 DIAGNOSIS — J449 Chronic obstructive pulmonary disease, unspecified: Secondary | ICD-10-CM

## 2017-07-23 DIAGNOSIS — Z9981 Dependence on supplemental oxygen: Secondary | ICD-10-CM | POA: Diagnosis not present

## 2017-07-23 DIAGNOSIS — I509 Heart failure, unspecified: Secondary | ICD-10-CM | POA: Diagnosis not present

## 2017-07-23 DIAGNOSIS — E669 Obesity, unspecified: Secondary | ICD-10-CM

## 2017-07-23 DIAGNOSIS — W0110XD Fall on same level from slipping, tripping and stumbling with subsequent striking against unspecified object, subsequent encounter: Secondary | ICD-10-CM | POA: Diagnosis not present

## 2017-07-23 DIAGNOSIS — Z6841 Body Mass Index (BMI) 40.0 and over, adult: Secondary | ICD-10-CM | POA: Diagnosis not present

## 2017-07-25 ENCOUNTER — Telehealth: Payer: Self-pay | Admitting: Internal Medicine

## 2017-07-25 DIAGNOSIS — G4733 Obstructive sleep apnea (adult) (pediatric): Secondary | ICD-10-CM

## 2017-07-25 NOTE — Telephone Encounter (Signed)
Received ONO on 2lpm (done on 06/28/17 by [facility]) Per MW- need to know if she was on CPAP or not  If not, needs CPAP titration study done next  ATC, NA and her VM is not set up yet [facility]

## 2017-07-26 NOTE — Telephone Encounter (Signed)
ATC, NA and still unable to leave msg, [facility]

## 2017-07-27 DIAGNOSIS — E11622 Type 2 diabetes mellitus with other skin ulcer: Secondary | ICD-10-CM | POA: Diagnosis not present

## 2017-07-28 NOTE — Telephone Encounter (Signed)
ATC, NA and no option to leave msg 

## 2017-07-29 NOTE — Telephone Encounter (Signed)
Pt states ONO was not performed with CPAP. CPAP titration has been ordered.  Pt is aware and voiced her understanding. Nothing further is needed.

## 2017-08-03 DIAGNOSIS — E11622 Type 2 diabetes mellitus with other skin ulcer: Secondary | ICD-10-CM | POA: Diagnosis not present

## 2017-08-06 ENCOUNTER — Other Ambulatory Visit: Payer: Self-pay | Admitting: Physician Assistant

## 2017-08-06 DIAGNOSIS — E119 Type 2 diabetes mellitus without complications: Secondary | ICD-10-CM

## 2017-08-06 DIAGNOSIS — Z794 Long term (current) use of insulin: Principal | ICD-10-CM

## 2017-08-06 DIAGNOSIS — R609 Edema, unspecified: Secondary | ICD-10-CM

## 2017-08-10 DIAGNOSIS — E11622 Type 2 diabetes mellitus with other skin ulcer: Secondary | ICD-10-CM | POA: Diagnosis not present

## 2017-08-12 ENCOUNTER — Ambulatory Visit (HOSPITAL_BASED_OUTPATIENT_CLINIC_OR_DEPARTMENT_OTHER): Payer: Medicare Other | Attending: Internal Medicine | Admitting: Pulmonary Disease

## 2017-08-12 VITALS — Ht 64.0 in | Wt 270.0 lb

## 2017-08-12 DIAGNOSIS — G4733 Obstructive sleep apnea (adult) (pediatric): Secondary | ICD-10-CM | POA: Insufficient documentation

## 2017-08-12 DIAGNOSIS — Z9981 Dependence on supplemental oxygen: Secondary | ICD-10-CM | POA: Diagnosis not present

## 2017-08-12 DIAGNOSIS — J449 Chronic obstructive pulmonary disease, unspecified: Secondary | ICD-10-CM | POA: Diagnosis not present

## 2017-08-16 ENCOUNTER — Other Ambulatory Visit: Payer: Self-pay | Admitting: Internal Medicine

## 2017-08-16 ENCOUNTER — Telehealth: Payer: Self-pay | Admitting: Internal Medicine

## 2017-08-16 ENCOUNTER — Encounter: Payer: Self-pay | Admitting: Internal Medicine

## 2017-08-16 DIAGNOSIS — G4733 Obstructive sleep apnea (adult) (pediatric): Secondary | ICD-10-CM | POA: Diagnosis not present

## 2017-08-16 DIAGNOSIS — G4730 Sleep apnea, unspecified: Secondary | ICD-10-CM | POA: Diagnosis not present

## 2017-08-16 NOTE — Procedures (Signed)
Patient Name: Renee Noble, [place] Date: 08/12/2017 Gender: Female D.O.B: Sep 30, 1955 Age (years): 50 Referring Provider: Tanda Rockers Height (inches): 54 Interpreting Physician: Kara Mead MD, ABSM Weight (lbs): 270 RPSGT: Lanae Boast BMI: 65 MRN: [id number] Neck Size: 16.00 <br> <br> CLINICAL INFORMATION The patient is referred for a CPAP titration to treat sleep apnea.  She has COPD on home O2 2.5 L ,baseline study not available on chart review  SLEEP STUDY TECHNIQUE As per the AASM Manual for the Scoring of Sleep and Associated Events v2.3 (April 2016) with a hypopnea requiring 4% desaturations.  The channels recorded and monitored were frontal, central and occipital EEG, electrooculogram (EOG), submentalis EMG (chin), nasal and oral airflow, thoracic and abdominal wall motion, anterior tibialis EMG, snore microphone, electrocardiogram, and pulse oximetry. Continuous positive airway pressure (CPAP) was initiated at the beginning of the study and titrated to treat sleep-disordered breathing.  RESPIRATORY PARAMETERS Optimal PAP Pressure (cm): 13 AHI at Optimal Pressure (/hr): 0.0 Overall Minimal O2 (%): 79.0 Supine % at Optimal Pressure (%): 100 Minimal O2 at Optimal Pressure (%): 86.0   SLEEP ARCHITECTURE The study was initiated at 11:19:54 PM and ended at 5:48:10 AM.  Sleep onset time was 9.4 minutes and the sleep efficiency was 90.1%%. The total sleep time was 349.8 minutes.  The patient spent 3.6%% of the night in stage N1 sleep, 87.4%% in stage N2 sleep, 2.1%% in stage N3 and 6.86% in REM.Stage REM latency was 211.5 minutes  Wake after sleep onset was 29.0. Alpha intrusion was absent. Supine sleep was 100.00%.  CARDIAC DATA The 2 lead EKG demonstrated sinus rhythm. The mean heart rate was 91.0 beats per minute. Other EKG findings include: None.   LEG MOVEMENT DATA The total Periodic Limb Movements of Sleep (PLMS) were 0. The PLMS index was 0.0. A PLMS index  of <15 is considered normal in adults.  IMPRESSIONS - The optimal PAP pressure was 13 cm of water. - Central sleep apnea was not noted during this titration (CAI = 0.0/h). - Severe oxygen desaturations were observed during this titration (min O2 = 79.0%). O2 was titrated to 3L due to desaturations at the final CPAP level - The patient snored with soft snoring volume during this titration study. - No cardiac abnormalities were observed during this study. - Clinically significant periodic limb movements were not noted during this study. Arousals associated with PLMs were rare.   DIAGNOSIS - Obstructive Sleep Apnea (327.23 [G47.33 ICD-10])   RECOMMENDATIONS - Trial of CPAP therapy on 13 cm H2O with a Small size Resmed Full Face Mask AirFit F20 mask and heated humidification. - 3L O2 should be blended into CPAP - Avoid alcohol, sedatives and other CNS depressants that may worsen sleep apnea and disrupt normal sleep architecture. - Sleep hygiene should be reviewed to assess factors that may improve sleep quality. - Weight management and regular exercise should be initiated or continued. - Return to [facility] for re-evaluation after 4 weeks of therapy   Kara Mead MD Board Certified in [place]

## 2017-08-16 NOTE — Telephone Encounter (Signed)
Tanda Rockers, MD sent to Rosana Berger, CMA        Trial of CPAP therapy on 13 cm H2O with a Small size Resmed Full Face Mask AirFit F20 mask and heated humidification.  - 3L O2 should be blended into CPAP   Arrange sleep consult Dr Elsworth Soho next available    Spoke with pt and notified of results per Dr. Melvyn Novas. Pt verbalized understanding and denied any questions. Order sent to [facility] and I have scheduled sleep cons with RA

## 2017-08-17 ENCOUNTER — Encounter (HOSPITAL_BASED_OUTPATIENT_CLINIC_OR_DEPARTMENT_OTHER): Payer: Medicare Other | Attending: Physician Assistant

## 2017-08-17 ENCOUNTER — Telehealth: Payer: Self-pay | Admitting: Pulmonary Disease

## 2017-08-17 DIAGNOSIS — N189 Chronic kidney disease, unspecified: Secondary | ICD-10-CM | POA: Diagnosis not present

## 2017-08-17 DIAGNOSIS — M7989 Other specified soft tissue disorders: Secondary | ICD-10-CM | POA: Diagnosis not present

## 2017-08-17 DIAGNOSIS — E11622 Type 2 diabetes mellitus with other skin ulcer: Secondary | ICD-10-CM | POA: Diagnosis not present

## 2017-08-17 DIAGNOSIS — L97222 Non-pressure chronic ulcer of left calf with fat layer exposed: Secondary | ICD-10-CM | POA: Diagnosis not present

## 2017-08-17 DIAGNOSIS — I129 Hypertensive chronic kidney disease with stage 1 through stage 4 chronic kidney disease, or unspecified chronic kidney disease: Secondary | ICD-10-CM | POA: Insufficient documentation

## 2017-08-17 DIAGNOSIS — L97822 Non-pressure chronic ulcer of other part of left lower leg with fat layer exposed: Secondary | ICD-10-CM | POA: Insufficient documentation

## 2017-08-17 DIAGNOSIS — E1122 Type 2 diabetes mellitus with diabetic chronic kidney disease: Secondary | ICD-10-CM | POA: Diagnosis not present

## 2017-08-17 NOTE — Telephone Encounter (Signed)
Called and spoke with [place] at [facility]. She is requesting a baseline sleep study and chart notes around the time MD ordered baseline study be faxed over to [facility], att: Bethanne Ginger, fax# [phone].  After investigating in patient's chart there is not a baseline sleep study. An overnight oxygen study was done and per that test it recommended a CPAP titration study be done. After those results were in Dr. Melvyn Novas ordered the titration study.   Called and spoke with Bethanne Ginger again and she states that the ONO does not say anything about CPAP and is going to fax over ONO results. Will look for fax and will follow up.

## 2017-08-18 DIAGNOSIS — L97929 Non-pressure chronic ulcer of unspecified part of left lower leg with unspecified severity: Secondary | ICD-10-CM | POA: Diagnosis not present

## 2017-08-18 DIAGNOSIS — I87312 Chronic venous hypertension (idiopathic) with ulcer of left lower extremity: Secondary | ICD-10-CM | POA: Diagnosis not present

## 2017-08-18 NOTE — Telephone Encounter (Signed)
Please see 07/25/17 phone note. Called pt to see where original sleep study was preformed at. Pt states sleep study was preformed in [place] abut 20 years ago.  Pt is unsure of how to obtain this study.  Pt stated that [facility] in [place] has been providing her with supplies.  ATC [facility] in [place] to obtain sleep study and was placed on hold for >25min. Will call back.  [facility] contact number is [phone]

## 2017-08-19 NOTE — Telephone Encounter (Signed)
Attempted to call [facility] contact number [phone] was placed on hold 61mins to obtain sleep study from [facility] located in [place], [place]. X2  Please see notes from 07/25/17 for full update

## 2017-08-22 NOTE — Telephone Encounter (Signed)
Called and spoke to [place] with [facility] at contact number [phone]. Pilar Plate stated we need to fax a request for medical records to [phone]. I also asked for a phone number to speak to someone at medical records for follow up and he told me there is not one. Faxed over request for sleep study.

## 2017-08-24 DIAGNOSIS — M549 Dorsalgia, unspecified: Secondary | ICD-10-CM | POA: Diagnosis not present

## 2017-08-24 DIAGNOSIS — G894 Chronic pain syndrome: Secondary | ICD-10-CM | POA: Diagnosis not present

## 2017-08-24 DIAGNOSIS — Z79891 Long term (current) use of opiate analgesic: Secondary | ICD-10-CM | POA: Diagnosis not present

## 2017-08-24 DIAGNOSIS — M542 Cervicalgia: Secondary | ICD-10-CM | POA: Diagnosis not present

## 2017-08-24 DIAGNOSIS — M5136 Other intervertebral disc degeneration, lumbar region: Secondary | ICD-10-CM | POA: Diagnosis not present

## 2017-08-24 DIAGNOSIS — Z79899 Other long term (current) drug therapy: Secondary | ICD-10-CM | POA: Diagnosis not present

## 2017-08-24 NOTE — Telephone Encounter (Signed)
We have received the Sleep Studies and I have faxed them to [facility]. I have received confirmation that the fax was received. The Sleep Studies have been placed in Dr. Elsworth Soho folder

## 2017-08-25 ENCOUNTER — Telehealth: Payer: Self-pay | Admitting: Physician Assistant

## 2017-08-25 NOTE — Telephone Encounter (Signed)
Received "[facility]" paperwork via fax. Placed in front bin with charge sheet.

## 2017-08-26 DIAGNOSIS — N183 Chronic kidney disease, stage 3 (moderate): Secondary | ICD-10-CM | POA: Diagnosis not present

## 2017-08-26 DIAGNOSIS — E1140 Type 2 diabetes mellitus with diabetic neuropathy, unspecified: Secondary | ICD-10-CM | POA: Diagnosis not present

## 2017-08-26 DIAGNOSIS — I130 Hypertensive heart and chronic kidney disease with heart failure and stage 1 through stage 4 chronic kidney disease, or unspecified chronic kidney disease: Secondary | ICD-10-CM | POA: Diagnosis not present

## 2017-08-26 DIAGNOSIS — E669 Obesity, unspecified: Secondary | ICD-10-CM

## 2017-08-26 DIAGNOSIS — E1122 Type 2 diabetes mellitus with diabetic chronic kidney disease: Secondary | ICD-10-CM | POA: Diagnosis not present

## 2017-08-26 DIAGNOSIS — Z794 Long term (current) use of insulin: Secondary | ICD-10-CM | POA: Diagnosis not present

## 2017-08-26 DIAGNOSIS — Z6841 Body Mass Index (BMI) 40.0 and over, adult: Secondary | ICD-10-CM

## 2017-08-26 DIAGNOSIS — I509 Heart failure, unspecified: Secondary | ICD-10-CM | POA: Diagnosis not present

## 2017-08-26 DIAGNOSIS — W0110XD Fall on same level from slipping, tripping and stumbling with subsequent striking against unspecified object, subsequent encounter: Secondary | ICD-10-CM | POA: Diagnosis not present

## 2017-08-26 DIAGNOSIS — J449 Chronic obstructive pulmonary disease, unspecified: Secondary | ICD-10-CM

## 2017-08-26 DIAGNOSIS — Z9981 Dependence on supplemental oxygen: Secondary | ICD-10-CM | POA: Diagnosis not present

## 2017-08-26 DIAGNOSIS — Z905 Acquired absence of kidney: Secondary | ICD-10-CM

## 2017-08-26 DIAGNOSIS — Z4800 Encounter for change or removal of nonsurgical wound dressing: Secondary | ICD-10-CM | POA: Diagnosis not present

## 2017-08-26 NOTE — Telephone Encounter (Signed)
Forms reviewed by PCP and signed by supervisor provider. Faxed to [facility].

## 2017-08-31 DIAGNOSIS — J449 Chronic obstructive pulmonary disease, unspecified: Secondary | ICD-10-CM | POA: Diagnosis not present

## 2017-09-07 DIAGNOSIS — G4733 Obstructive sleep apnea (adult) (pediatric): Secondary | ICD-10-CM | POA: Diagnosis not present

## 2017-09-09 ENCOUNTER — Other Ambulatory Visit: Payer: Self-pay | Admitting: Physician Assistant

## 2017-09-09 DIAGNOSIS — R609 Edema, unspecified: Secondary | ICD-10-CM

## 2017-09-09 NOTE — Telephone Encounter (Signed)
Last OV 05/25/17, No future OV  Last filled 08/08/17, # 30 with 0 refills  Please advise if okay to continue to fill?

## 2017-09-12 ENCOUNTER — Ambulatory Visit: Payer: Medicare Other | Admitting: Adult Health

## 2017-09-20 ENCOUNTER — Ambulatory Visit: Payer: Medicare Other | Admitting: Adult Health

## 2017-09-21 ENCOUNTER — Ambulatory Visit: Payer: Medicare Other

## 2017-09-21 DIAGNOSIS — Z79891 Long term (current) use of opiate analgesic: Secondary | ICD-10-CM | POA: Diagnosis not present

## 2017-09-21 DIAGNOSIS — M2392 Unspecified internal derangement of left knee: Secondary | ICD-10-CM | POA: Diagnosis not present

## 2017-09-21 DIAGNOSIS — Z79899 Other long term (current) drug therapy: Secondary | ICD-10-CM | POA: Diagnosis not present

## 2017-09-21 DIAGNOSIS — G894 Chronic pain syndrome: Secondary | ICD-10-CM | POA: Diagnosis not present

## 2017-09-21 DIAGNOSIS — M47817 Spondylosis without myelopathy or radiculopathy, lumbosacral region: Secondary | ICD-10-CM | POA: Diagnosis not present

## 2017-09-21 NOTE — Progress Notes (Addendum)
Subjective:   Renee Noble is a 62 y.o. female who presents for Medicare Annual (Subsequent) preventive examination.  Review of Systems:  No ROS.  Medicare Wellness Visit. Additional risk factors are reflected in the social history.    Sleep patterns: Sleeps 5-7 hours. Uses CPAP.  Home Safety/Smoke Alarms: Feels safe in home. Smoke alarms in place.  Living environment; residence and Firearm Safety: Lives with cousin, cousin in law in 1 story home.  Seat Belt Safety/Bike Helmet: Wears seat belt.   Female:   [id number]      Mammo-11/23/2016, BI-RADS CATEGORY  2: Benign. Prefers every 2 years.  Dexa scan-N/A       CCS-Colonoscopy 03/15/2014, pt reports normal. Recall 7-8 years.      Objective:     Vitals: There were no vitals taken for this visit.  There is no height or weight on file to calculate BMI.  Advanced Directives 08/12/2017 07/11/2015 07/11/2015 06/25/2015 06/18/2015 01/10/2015  Does Patient Have a Medical Advance Directive? Yes Yes Yes No No No  Type of Paramedic of [place];Living will Healthcare Power of [place] - - -  Does patient want to make changes to medical advance directive? No - Patient declined - - - - -  Copy of [place] in Chart? No - copy requested - - - - -  Would patient like information on creating a medical advance directive? - - - - No - patient declined information -    Tobacco Social History   Tobacco Use  Smoking Status Former Smoker  . Packs/day: 2.50  . Years: 45.00  . Pack years: 112.50  . Types: Cigarettes  . Last attempt to quit: 07/21/2016  . Years since quitting: 1.1  Smokeless Tobacco Never Used     Counseling given: Not Answered    Past Medical History:  Diagnosis Date  . Abdominal mass of other site   . Cervical compression fracture ([place])   . Chronic kidney disease, stage 3 (HCC)    Borderline Stage 2-3  . Chronic lower limb pain   . COPD (chronic  obstructive pulmonary disease) ([place])   . CTS (carpal tunnel syndrome)   . Depression   . Diabetes ([place])    Type II  . Fibromyalgia   . Hepatitis C   . Hypercholesteremia   . Hypertension   . Hypothyroidism   . Morbid obesity ([place])   . Neuropathy    Diabetes  . OSA (obstructive sleep apnea)   . Osteoarthritis   . Renal cancer ([place])    Left Kidney Removed  . RLS (restless legs syndrome)   . Syncope   . Venous stasis    Past Surgical History:  Procedure Laterality Date  . ABDOMINAL HERNIA REPAIR     x2  . ABDOMINAL HYSTERECTOMY    . BLADDER SUSPENSION    . CHOLECYSTECTOMY    . CYST EXCISION     Head  . INCISIONAL HERNIA REPAIR    . KNEE ARTHROSCOPY     Bilateral  . NEPHRECTOMY     Left  . TONSILLECTOMY    . TOOTH EXTRACTION    . UMBILICAL HERNIA REPAIR    . VAGINA SURGERY    . WISDOM TOOTH EXTRACTION     Family History  Problem Relation Age of Onset  . Heart attack Mother 27       Deceased  . Heart disease Mother   . Emphysema Mother   .  Alcoholism Mother   . COPD Father 36       Deceased  . Emphysema Father   . Alcoholism Father   . Esophageal varices Father   . Alcoholism Paternal Grandfather   . Diabetes Maternal Grandmother   . Heart disease Maternal Grandmother   . Lung cancer Maternal Grandfather   . Emphysema Maternal Grandfather   . Brain cancer Maternal Aunt   . Diabetes Sister   . Heart defect Sister   . Obesity Son   . Breast cancer Maternal Aunt    Social History   Socioeconomic History  . Marital status: Single    Spouse name: Not on file  . Number of children: Not on file  . Years of education: Not on file  . Highest education level: Not on file  Occupational History  . Not on file  Social Needs  . Financial resource strain: Not on file  . Food insecurity:    Worry: Not on file    Inability: Not on file  . Transportation needs:    Medical: Not on file    Non-medical: Not on file  Tobacco Use  . Smoking status: Former Smoker     Packs/day: 2.50    Years: 45.00    Pack years: 112.50    Types: Cigarettes    Last attempt to quit: 07/21/2016    Years since quitting: 1.1  . Smokeless tobacco: Never Used  Substance and Sexual Activity  . Alcohol use: No  . Drug use: No  . Sexual activity: Never  Lifestyle  . Physical activity:    Days per week: Not on file    Minutes per session: Not on file  . Stress: Not on file  Relationships  . Social connections:    Talks on phone: Not on file    Gets together: Not on file    Attends religious service: Not on file    Active member of club or organization: Not on file    Attends meetings of clubs or organizations: Not on file    Relationship status: Not on file  Other Topics Concern  . Not on file  Social History Narrative  . Not on file    Outpatient Encounter Medications as of 09/22/2017  Medication Sig  . albuterol (PROVENTIL HFA;VENTOLIN HFA) 108 (90 Base) MCG/ACT inhaler Inhale 1-2 puffs into the lungs every 6 (six) hours as needed for wheezing or shortness of breath.  . EMBEDA 30-1.2 MG CPCR Take 1 capsule by mouth 2 (two) times daily. Take 1 capsule by mouth twice daily  . Glycopyrrolate-Formoterol (BEVESPI AEROSPHERE) 9-4.8 MCG/ACT AERO Inhale 2 puffs into the lungs 2 (two) times daily.  [name] Kitchen HUMALOG 100 UNIT/ML injection INJECT 0.15 MILLILITERS (15 UNITS) INTO THE SKIN 3 TIMES DAILY AS NEEDED FOR HIGH BLOOD SUGAR  . ipratropium-albuterol (DUONEB) 0.5-2.5 (3) MG/3ML SOLN Take 3 mLs by nebulization 2 (two) times daily.  [name] Kitchen LANTUS 100 UNIT/ML injection INJECT 0.3MLS (30 UNITS TOTAL) INTO THE SKIN EVERY DAY  . levothyroxine (SYNTHROID, LEVOTHROID) 88 MCG tablet TAKE ONE TABLET BY MOUTH EVERY DAY BEFORE BREAKFAST  . losartan (COZAAR) 25 MG tablet Take 1 tablet by mouth daily.  . nortriptyline (PAMELOR) 25 MG capsule TAKE 1 CAPSULE BY MOUTH AT BEDTIME  . nortriptyline (PAMELOR) 75 MG capsule TAKE 1 CAPSULE BY MOUTH AT BEDTIME  . nystatin (NYSTATIN) powder Apply 1  Bottle topically 2 (two) times daily as needed (yeast infection).  [name] Kitchen oxyCODONE (OXY IR/ROXICODONE) 5 MG immediate release tablet  Take 5 mg by mouth 2 (two) times daily as needed for breakthrough pain. Take 1 tablet by mouth twice daily as needed  . OXYGEN O2 2lpm with sleep and 2.5 lpm with exertion  [employer]  . pravastatin (PRAVACHOL) 20 MG tablet TAKE ONE TABLET BY MOUTH EVERY DAY  . SURE COMFORT INSULIN SYRINGE 30G X 1/2" 1 ML MISC USE FOUR (4) TIMES DAILY AS DIRECTED  . tiZANidine (ZANAFLEX) 2 MG tablet Take 2-4 mg by mouth every 12 (twelve) hours as needed for muscle spasms.  [name] Kitchen torsemide (DEMADEX) 20 MG tablet TAKE ONE TABLET BY MOUTH EVERY DAY   No facility-administered encounter medications on file as of 09/22/2017.     Activities of Daily Living In your present state of health, do you have any difficulty performing the following activities: 12/31/2016  Hearing? N  Vision? N  Difficulty concentrating or making decisions? N  Walking or climbing stairs? N  Dressing or bathing? N  Doing errands, shopping? N  Some recent data might be hidden    Patient Care Team: Delorse Limber as PCP - General (Physician Assistant)    Assessment:   This is a routine wellness examination for Mallarie.  Exercise Activities and Dietary recommendations   Diet (meal preparation, eat out, water intake, caffeinated beverages, dairy products, fruits and vegetables): Drinks water and occasional sugar free tea.  Breakfast: oatmeal, toast, coffee Lunch: cheese, fruit Dinner: low carb veggies, 4 oz lean protein   Goals    None      Fall Risk Fall Risk  12/31/2016 01/10/2015  Falls in the past year? No Yes  Number falls in past yr: - 1  Injury with Fall? - No    Depression Screen PHQ 2/9 Scores 12/31/2016 12/31/2016 04/13/2016 12/02/2014  PHQ - 2 Score 0 0 2 3  PHQ- 9 Score - 0 3 12     Cognitive Function       Ad8 score reviewed for issues:  Issues making decisions: no  Less  interest in hobbies / activities: no  Repeats questions, stories (family complaining): no  Trouble using ordinary gadgets (microwave, computer, phone): no  Forgets the month or year: no  Mismanaging finances: no  Remembering appts: no  Daily problems with thinking and/or memory: no Ad8 score is=0     Immunization History  Administered Date(s) Administered  . Influenza,inj,Quad PF,6+ Mos 04/17/2015, 04/28/2017  . Influenza-Unspecified 06/17/2013  . Pneumococcal-Unspecified 06/17/2013  . Tdap 01/16/2008     Screening Tests Health Maintenance  Topic Date Due  . FOOT EXAM  01/06/2017  . OPHTHALMOLOGY EXAM  07/07/2017  . HEMOGLOBIN A1C  11/22/2017  . INFLUENZA VACCINE  12/15/2017  . DTaP/Tdap/Td (2 - Td) 01/15/2018  . TETANUS/TDAP  01/15/2018  . COLONOSCOPY  11/15/2018  . MAMMOGRAM  11/24/2018  . Hepatitis C Screening  Completed  . HIV Screening  Completed        Plan:    Bring a copy of your living will and/or healthcare power of attorney to your next office visit.  Continue doing brain stimulating activities (puzzles, reading, adult coloring books, staying active) to keep memory sharp.   I have personally reviewed and noted the following in the patient's chart:   . Medical and social history . Use of alcohol, tobacco or illicit drugs  . Current medications and supplements . Functional ability and status . Nutritional status . Physical activity . Advanced directives . List of other physicians . Hospitalizations, surgeries, and ER visits in  previous 12 months . Vitals . Screenings to include cognitive, depression, and falls . Referrals and appointments  In addition, I have reviewed and discussed with patient certain preventive protocols, quality metrics, and best practice recommendations. A written personalized care plan for preventive services as well as general preventive health recommendations were provided to patient.     Gerilyn Nestle,  RN  09/21/2017  PCP Notes: -Pt requesting prescriptions for Chantix and Lotrisone Cream, phone note sent.  -Continues to have LLE wound, pt reports healing slowly.  -Pt to call for f/u with PCP  Reviewed documentation provided by RN and agree w/ above.  Will address prescriptions via phone note.  Annye Asa, MD

## 2017-09-22 ENCOUNTER — Ambulatory Visit (INDEPENDENT_AMBULATORY_CARE_PROVIDER_SITE_OTHER): Payer: Medicare Other

## 2017-09-22 ENCOUNTER — Other Ambulatory Visit: Payer: Self-pay

## 2017-09-22 VITALS — BP 110/60 | HR 61 | Ht 64.0 in | Wt 272.0 lb

## 2017-09-22 DIAGNOSIS — Z0000 Encounter for general adult medical examination without abnormal findings: Secondary | ICD-10-CM | POA: Diagnosis not present

## 2017-09-22 NOTE — Telephone Encounter (Signed)
Pt in for [facility] requesting refills for Chantix and Lotrisone Cream. Advised PCP will be out of office until 09/27/17 and will be addressed at that time since they are not current medications. Pt okay with this and verbalized understanding.   FYI-Pt continues to not smoke currently, but family she resides with does. Requesting refill to assist with temptation.

## 2017-09-22 NOTE — Patient Instructions (Addendum)

## 2017-09-23 MED ORDER — VARENICLINE TARTRATE 1 MG PO TABS: 1.0000 mg | ORAL_TABLET | Freq: Two times a day (BID) | ORAL | 2 refills | Status: DC

## 2017-09-23 MED ORDER — CLOTRIMAZOLE-BETAMETHASONE 1-0.05 % EX CREA: 1.0000 "application " | TOPICAL_CREAM | Freq: Two times a day (BID) | CUTANEOUS | 0 refills | Status: DC | PRN

## 2017-09-23 NOTE — Telephone Encounter (Signed)
Reviewed chart and refilled requested meds.

## 2017-09-27 ENCOUNTER — Other Ambulatory Visit: Payer: Self-pay | Admitting: Physician Assistant

## 2017-09-27 DIAGNOSIS — B354 Tinea corporis: Secondary | ICD-10-CM

## 2017-09-30 DIAGNOSIS — J449 Chronic obstructive pulmonary disease, unspecified: Secondary | ICD-10-CM | POA: Diagnosis not present

## 2017-10-03 ENCOUNTER — Encounter: Payer: Self-pay | Admitting: Pulmonary Disease

## 2017-10-03 ENCOUNTER — Ambulatory Visit: Payer: Medicare Other | Admitting: Pulmonary Disease

## 2017-10-03 VITALS — BP 134/80 | HR 100 | Ht 64.0 in | Wt 269.8 lb

## 2017-10-03 DIAGNOSIS — J449 Chronic obstructive pulmonary disease, unspecified: Secondary | ICD-10-CM

## 2017-10-03 DIAGNOSIS — J9611 Chronic respiratory failure with hypoxia: Secondary | ICD-10-CM

## 2017-10-03 DIAGNOSIS — J9612 Chronic respiratory failure with hypercapnia: Secondary | ICD-10-CM

## 2017-10-03 DIAGNOSIS — G4733 Obstructive sleep apnea (adult) (pediatric): Secondary | ICD-10-CM

## 2017-10-03 MED ORDER — PREDNISONE 10 MG PO TABS: ORAL_TABLET | ORAL | 0 refills | Status: DC

## 2017-10-03 NOTE — Assessment & Plan Note (Signed)
We will treat as mild flare due to pollen Prednisone 10 mg tabs  Take 2 tabs daily with food x 5ds, then 1 tab daily with food x 5ds then STOP

## 2017-10-03 NOTE — Patient Instructions (Signed)
Prednisone 10 mg tabs  Take 2 tabs daily with food x 5ds, then 1 tab daily with food x 5ds then STOP  Prescription will be sent to [facility] for portable oxygen concentrator 2 L pulse at rest and 3 L pulse on exertion  We will check a report on your CPAP machine and ensure settings are correct

## 2017-10-03 NOTE — Assessment & Plan Note (Signed)
CPAP 13 cm seems to work very well with full facemask.  She has adjusted well to the mask. Download shows good compliance  Weight loss encouraged, compliance with goal of at least 4-6 hrs every night is the expectation. Advised against medications with sedative side effects Cautioned against driving when sleepy - understanding that sleepiness will vary on a day to day basis

## 2017-10-03 NOTE — Assessment & Plan Note (Signed)
Prescription will be sent to [facility] for portable oxygen concentrator 2 L pulse at rest and 3 L pulse on exertion

## 2017-10-03 NOTE — Progress Notes (Signed)
Subjective:    Patient ID: Renee Noble, female    DOB: 1955-09-30, 62 y.o.   MRN: [id number]  HPI 62 year old heavy ex-smoker with severe COPD maintained on home oxygen, referred for evaluation of obstructive sleep apnea. Baseline sleep study is not available to me at the time of dictation but this was apparently done years ago and she has been maintained on CPAP for more than 20 years, initially with a nasal mask with chinstrap but now is supple with full facemask. She smoked for more than 100 pack years before she quit in 2015, she is maintained on oxygen for more than 12 years but required daytime oxygen by 3 years ago and now uses about 2 L pulse at rest and 3 L on exertion.  She has lost about 40 pounds from 302 pounds in 2017 to her current weight of 269 pounds.  She also has diabetes, hypertension, fibromyalgia and arthritis.  She is being evaluated for abd hernia surgery and was asked to lose weight.  CPAP titration study 3/29 was reviewed, this showed control of events on CPAP 13 cm with 3 L oxygen blended in.  Epworth sleepiness score is 5. Bedtime is between 9:11 PM, sleep latency is about an hour or more, she sleeps on her back with 2 pillows, reports 2-3 nocturnal awakenings including nocturia and is out of bed by 6 AM feeling rested when she is CPAP.  She can tell a difference when she does not, denies dryness since she uses the humidity. She denies daytime naps or sleep pressure in the daytime.  CPAP download was reviewed which shows excellent control of events and 13 cm with good compliance and minimal leak   She would also like to obtain a portable concentrator.  She has started on exercise program which is covered by her insurance but she cannot carry her heavy oxygen tank with her. She also reports increasing wheezing and coughing over the last 2 weeks which may be due to pollen season   Significant tests/ events reviewed  Spirometry 04/2017-ratio 47, FEV1 30%, FVC  48%  CPAP titration 07/2017 >> 13 cm H2O with a Small size Resmed Full Face Mask AirFit F20  + 3L O2   Past Medical History:  Diagnosis Date  . Abdominal mass of other site   . Cervical compression fracture ([place])   . Chronic kidney disease, stage 3 (HCC)    Borderline Stage 2-3  . Chronic lower limb pain   . COPD (chronic obstructive pulmonary disease) ([place])   . CTS (carpal tunnel syndrome)   . Depression   . Diabetes ([place])    Type II  . Fibromyalgia   . Hepatitis C   . Hypercholesteremia   . Hypertension   . Hypothyroidism   . Morbid obesity ([place])   . Neuropathy    Diabetes  . OSA (obstructive sleep apnea)   . Osteoarthritis   . Renal cancer ([place])    Left Kidney Removed  . RLS (restless legs syndrome)   . Syncope   . Venous stasis     Past Surgical History:  Procedure Laterality Date  . ABDOMINAL HERNIA REPAIR     x2  . ABDOMINAL HYSTERECTOMY    . BLADDER SUSPENSION    . CHOLECYSTECTOMY    . CYST EXCISION     Head  . INCISIONAL HERNIA REPAIR    . KNEE ARTHROSCOPY     Bilateral  . NEPHRECTOMY     Left  . TONSILLECTOMY    .  TOOTH EXTRACTION    . UMBILICAL HERNIA REPAIR    . VAGINA SURGERY    . WISDOM TOOTH EXTRACTION      Allergies  Allergen Reactions  . Gabapentin Anaphylaxis  . Lyrica [Pregabalin] Shortness Of Breath    Trouble breathing  . Ketorolac Tromethamine Hives  . Lisinopril Cough    Social History   Socioeconomic History  . Marital status: Single    Spouse name: Not on file  . Number of children: Not on file  . Years of education: Not on file  . Highest education level: Not on file  Occupational History  . Not on file  Social Needs  . Financial resource strain: Not on file  . Food insecurity:    Worry: Not on file    Inability: Not on file  . Transportation needs:    Medical: Not on file    Non-medical: Not on file  Tobacco Use  . Smoking status: Former Smoker    Packs/day: 2.50    Years: 45.00    Pack years: 112.50     Types: Cigarettes    Last attempt to quit: 07/21/2016    Years since quitting: 1.2  . Smokeless tobacco: Never Used  Substance and Sexual Activity  . Alcohol use: No  . Drug use: No  . Sexual activity: Never  Lifestyle  . Physical activity:    Days per week: Not on file    Minutes per session: Not on file  . Stress: Not on file  Relationships  . Social connections:    Talks on phone: Not on file    Gets together: Not on file    Attends religious service: Not on file    Active member of club or organization: Not on file    Attends meetings of clubs or organizations: Not on file    Relationship status: Not on file  . Intimate partner violence:    Fear of current or ex partner: Not on file    Emotionally abused: Not on file    Physically abused: Not on file    Forced sexual activity: Not on file  Other Topics Concern  . Not on file  Social History Narrative  . Not on file    Family History  Problem Relation Age of Onset  . Heart attack Mother 82       Deceased  . Heart disease Mother   . Emphysema Mother   . Alcoholism Mother   . COPD Father 20       Deceased  . Emphysema Father   . Alcoholism Father   . Esophageal varices Father   . Alcoholism Paternal Grandfather   . Diabetes Maternal Grandmother   . Heart disease Maternal Grandmother   . Lung cancer Maternal Grandfather   . Emphysema Maternal Grandfather   . Brain cancer Maternal Aunt   . Diabetes Sister   . Heart defect Sister   . Heart defect Sister   . Obesity Son   . Breast cancer Maternal Aunt       Review of Systems Positive for nonproductive cough, acid heartburn, weight loss nasal congestion, feet swelling, depressive symptoms  Constitutional: negative for anorexia, fevers and sweats  Eyes: negative for irritation, redness and visual disturbance  Ears, nose, mouth, throat, and face: negative for earaches, epistaxis, nasal congestion and sore throat   Cardiovascular: negative for chest pain,  dyspnea, orthopnea, palpitations and syncope  Gastrointestinal: negative for abdominal pain, constipation, diarrhea, melena, nausea and vomiting  Genitourinary:negative for dysuria, frequency and hematuria  Hematologic/lymphatic: negative for bleeding, easy bruising and lymphadenopathy  Musculoskeletal:negative for arthralgias, muscle weakness and stiff joints  Neurological: negative for coordination problems, gait problems, headaches and weakness  Endocrine: negative for diabetic symptoms including polydipsia, polyuria and weight loss     Objective:   Physical Exam  Gen. Pleasant, obese, in no distress, normal affect ENT - no thrush, no post nasal drip, class 2-3 airway Neck: No JVD, no thyromegaly, no carotid bruits Lungs: no use of accessory muscles, no dullness to percussion, decreased without rales, BL scattered rhonchi  Cardiovascular: Rhythm regular, heart sounds  normal, no murmurs or gallops, 1+ peripheral edema Abdomen: soft and non-tender, no hepatosplenomegaly, BS normal. Musculoskeletal: No deformities, no cyanosis or clubbing Neuro:  alert, non focal, no tremors       Assessment & Plan:

## 2017-10-07 DIAGNOSIS — G4733 Obstructive sleep apnea (adult) (pediatric): Secondary | ICD-10-CM | POA: Diagnosis not present

## 2017-10-11 ENCOUNTER — Ambulatory Visit (INDEPENDENT_AMBULATORY_CARE_PROVIDER_SITE_OTHER): Payer: Medicare Other | Admitting: Adult Health

## 2017-10-11 ENCOUNTER — Encounter: Payer: Self-pay | Admitting: Adult Health

## 2017-10-11 DIAGNOSIS — J449 Chronic obstructive pulmonary disease, unspecified: Secondary | ICD-10-CM | POA: Diagnosis not present

## 2017-10-11 DIAGNOSIS — J9611 Chronic respiratory failure with hypoxia: Secondary | ICD-10-CM | POA: Diagnosis not present

## 2017-10-11 DIAGNOSIS — J9612 Chronic respiratory failure with hypercapnia: Secondary | ICD-10-CM

## 2017-10-11 DIAGNOSIS — G4733 Obstructive sleep apnea (adult) (pediatric): Secondary | ICD-10-CM | POA: Diagnosis not present

## 2017-10-11 MED ORDER — ALBUTEROL SULFATE HFA 108 (90 BASE) MCG/ACT IN AERS: 1.0000 | INHALATION_SPRAY | Freq: Four times a day (QID) | RESPIRATORY_TRACT | 5 refills | Status: DC | PRN

## 2017-10-11 NOTE — Progress Notes (Signed)
@Patient  ID: Renee Noble, female    DOB: 1955/12/11, 62 y.o.   MRN: [id number]  Chief Complaint  Patient presents with  . Follow-up    COPD     Referring provider: Delorse Limber  HPI: 62 year old female former heavy smoker followed for severe COPD on home oxygen and obstructive sleep apnea  10/11/2017 Follow up : COPD  Patient presents for a 2-week follow-up.  Patient was seen last visit for a COPD exacerbation.  He was given a prednisone taper.  She says she is feeling better.  With decreased cough and shortness of breath.  She feels breathing has returned back to baseline.  She remains on Bevespi 2 puffs twice daily    Allergies  Allergen Reactions  . Gabapentin Anaphylaxis  . Lyrica [Pregabalin] Shortness Of Breath    Trouble breathing  . Ketorolac Tromethamine Hives  . Lisinopril Cough    Immunization History  Administered Date(s) Administered  . Influenza,inj,Quad PF,6+ Mos 04/17/2015, 04/28/2017  . Influenza-Unspecified 06/17/2013  . Pneumococcal-Unspecified 06/17/2013  . Tdap 01/16/2008    Past Medical History:  Diagnosis Date  . Abdominal mass of other site   . Cervical compression fracture ([place])   . Chronic kidney disease, stage 3 (HCC)    Borderline Stage 2-3  . Chronic lower limb pain   . COPD (chronic obstructive pulmonary disease) ([place])   . CTS (carpal tunnel syndrome)   . Depression   . Diabetes ([place])    Type II  . Fibromyalgia   . Hepatitis C   . Hypercholesteremia   . Hypertension   . Hypothyroidism   . Morbid obesity ([place])   . Neuropathy    Diabetes  . OSA (obstructive sleep apnea)   . Osteoarthritis   . Renal cancer ([place])    Left Kidney Removed  . RLS (restless legs syndrome)   . Syncope   . Venous stasis     Tobacco History: Social History   Tobacco Use  Smoking Status Former Smoker  . Packs/day: 2.50  . Years: 45.00  . Pack years: 112.50  . Types: Cigarettes  . Last attempt to quit: 07/21/2016  . Years since  quitting: 1.2  Smokeless Tobacco Never Used   Counseling given: Not Answered   Outpatient Encounter Medications as of 10/11/2017  Medication Sig  . albuterol (PROVENTIL HFA;VENTOLIN HFA) 108 (90 Base) MCG/ACT inhaler Inhale 1-2 puffs into the lungs every 6 (six) hours as needed for wheezing or shortness of breath.  . clotrimazole-betamethasone (LOTRISONE) cream Apply 1 application topically 2 (two) times daily as needed.  . EMBEDA 30-1.2 MG CPCR Take 1 capsule by mouth 2 (two) times daily. Take 1 capsule by mouth twice daily  . Glycopyrrolate-Formoterol (BEVESPI AEROSPHERE) 9-4.8 MCG/ACT AERO Inhale 2 puffs into the lungs 2 (two) times daily.  [name] Kitchen HUMALOG 100 UNIT/ML injection INJECT 0.15 MILLILITERS (15 UNITS) INTO THE SKIN 3 TIMES DAILY AS NEEDED FOR HIGH BLOOD SUGAR  . ipratropium-albuterol (DUONEB) 0.5-2.5 (3) MG/3ML SOLN Take 3 mLs by nebulization 2 (two) times daily.  [name] Kitchen LANTUS 100 UNIT/ML injection INJECT 0.3MLS (30 UNITS TOTAL) INTO THE SKIN EVERY DAY  . levothyroxine (SYNTHROID, LEVOTHROID) 88 MCG tablet TAKE ONE TABLET BY MOUTH EVERY DAY BEFORE BREAKFAST  . losartan (COZAAR) 25 MG tablet Take 1 tablet by mouth daily.  . Multiple Vitamins-Minerals (CENTRUM SILVER PO) Take by mouth.  . nortriptyline (PAMELOR) 25 MG capsule TAKE 1 CAPSULE BY MOUTH AT BEDTIME  . nortriptyline (PAMELOR) 75 MG capsule TAKE  1 CAPSULE BY MOUTH AT BEDTIME  . nystatin (MYCOSTATIN/NYSTOP) powder APPLY TOPICALLY TWICE DAILY AS NEEDED FOR YEAST INFECTION  . oxyCODONE (OXY IR/ROXICODONE) 5 MG immediate release tablet Take 5 mg by mouth 2 (two) times daily as needed for breakthrough pain. Take 1 tablet by mouth twice daily as needed  . OXYGEN O2 2lpm with sleep and 2.5 lpm with exertion  [employer]  . pravastatin (PRAVACHOL) 20 MG tablet TAKE ONE TABLET BY MOUTH EVERY DAY  . predniSONE (DELTASONE) 10 MG tablet Take 2 tablets with food daily x5days, take 1 tablet with food daily x5days, then stop  . Probiotic Product  (PROBIOTIC PO) Take by mouth.  Haig Prophet COMFORT INSULIN SYRINGE 30G X 1/2" 1 ML MISC USE FOUR (4) TIMES DAILY AS DIRECTED  . tiZANidine (ZANAFLEX) 2 MG tablet Take 2-4 mg by mouth every 12 (twelve) hours as needed for muscle spasms.  [name] Kitchen torsemide (DEMADEX) 20 MG tablet TAKE ONE TABLET BY MOUTH EVERY DAY  . varenicline (CHANTIX) 1 MG tablet Take 1 tablet (1 mg total) by mouth 2 (two) times daily.  . [DISCONTINUED] albuterol (PROVENTIL HFA;VENTOLIN HFA) 108 (90 Base) MCG/ACT inhaler Inhale 1-2 puffs into the lungs every 6 (six) hours as needed for wheezing or shortness of breath.   No facility-administered encounter medications on file as of 10/11/2017.      Review of Systems  Constitutional:   No  weight loss, night sweats,  Fevers, chills,  +fatigue, or  lassitude.  HEENT:   No headaches,  Difficulty swallowing,  Tooth/dental problems, or  Sore throat,                No sneezing, itching, ear ache, nasal congestion, post nasal drip,   CV:  No chest pain,  Orthopnea, PND, swelling in lower extremities, anasarca, dizziness, palpitations, syncope.   GI  No heartburn, indigestion, abdominal pain, nausea, vomiting, diarrhea, change in bowel habits, loss of appetite, bloody stools.   Resp:    No chest wall deformity  Skin: no rash or lesions.  GU: no dysuria, change in color of urine, no urgency or frequency.  No flank pain, no hematuria   MS:  No joint pain or swelling.  No decreased range of motion.  No back pain.    Physical Exam  BP 122/72 (BP Location: Left Arm, Cuff Size: Normal)   Pulse 83   Ht 5\' 4"  (1.626 m)   Wt 269 lb (122 kg)   SpO2 93%   BMI 46.17 kg/m   GEN: A/Ox3; pleasant , NAD, obese , on O2    HEENT:  [place]/AT,  EACs-clear, TMs-wnl, NOSE-clear, THROAT-clear, no lesions, no postnasal drip or exudate noted.   NECK:  Supple w/ fair ROM; no JVD; normal carotid impulses w/o bruits; no thyromegaly or nodules palpated; no lymphadenopathy.    RESP decreased breath sounds  in the bases.   no accessory muscle use, no dullness to percussion  CARD:  RRR, no m/r/g, trperipheral edema, pulses intact, no cyanosis or clubbing.  GI:   Soft & nt; nml bowel sounds; no organomegaly or masses detected.   Musco: Warm bil, no deformities or joint swelling noted.   Neuro: alert, no focal deficits noted.    Skin: Warm, no lesions or rashes    Lab Results:  CBC   BNP  ProBNP No results found for: PROBNP  Imaging: No results found.   Assessment & Plan:   COPD GOLDIII with min reversibility  Recent flare now improved with  steroids   Plan Patient Instructions  Continue on BEVESPI 2 puffs Twice daily   Continue on Oxygen  Follow up with Dr. Melvyn Novas  In 4 months and As needed   Follow up with Dr. Elsworth Soho  In 1 year for sleep       Chronic respiratory failure with hypoxia and hypercapnia ([place]) Cont on o2   OSA (obstructive sleep apnea) Cont on CPAP      Rexene Edison, NP 10/11/2017

## 2017-10-11 NOTE — Assessment & Plan Note (Signed)
Cont on CPAP  

## 2017-10-11 NOTE — Assessment & Plan Note (Signed)
Recent flare now improved with steroids   Plan Patient Instructions  Continue on BEVESPI 2 puffs Twice daily   Continue on Oxygen  Follow up with Dr. Melvyn Novas  In 4 months and As needed   Follow up with Dr. Elsworth Soho  In 1 year for sleep

## 2017-10-11 NOTE — Assessment & Plan Note (Signed)
Cont on o2 .  

## 2017-10-11 NOTE — Patient Instructions (Signed)
Continue on BEVESPI 2 puffs Twice daily   Continue on Oxygen  Follow up with Dr. Melvyn Novas  In 4 months and As needed   Follow up with Dr. Elsworth Soho  In 1 year for sleep

## 2017-10-13 ENCOUNTER — Telehealth: Payer: Self-pay | Admitting: *Deleted

## 2017-10-13 NOTE — Progress Notes (Signed)
Chart and office note reviewed in detail  > agree with a/p as outlined  Though will move f/u to 4 weeks as just had flare and need to make sure maintains control/ verify all meds being taken as instructed

## 2017-10-13 NOTE — Telephone Encounter (Signed)
-----   Message from Tanda Rockers, MD sent at 10/13/2017  8:47 AM EDT ----- Change f/u to 4 weeks with all meds in hand

## 2017-10-13 NOTE — Telephone Encounter (Signed)
ATC, NA and voicemail box not set up

## 2017-10-17 ENCOUNTER — Other Ambulatory Visit: Payer: Self-pay | Admitting: Physician Assistant

## 2017-10-17 DIAGNOSIS — E785 Hyperlipidemia, unspecified: Secondary | ICD-10-CM

## 2017-10-17 DIAGNOSIS — R609 Edema, unspecified: Secondary | ICD-10-CM

## 2017-10-18 NOTE — Telephone Encounter (Signed)
Spoke with the pt and scheduled appt with MW with all meds 11/18/17

## 2017-10-26 DIAGNOSIS — Z79891 Long term (current) use of opiate analgesic: Secondary | ICD-10-CM | POA: Diagnosis not present

## 2017-10-26 DIAGNOSIS — G894 Chronic pain syndrome: Secondary | ICD-10-CM | POA: Diagnosis not present

## 2017-10-26 DIAGNOSIS — M47817 Spondylosis without myelopathy or radiculopathy, lumbosacral region: Secondary | ICD-10-CM | POA: Diagnosis not present

## 2017-10-26 DIAGNOSIS — M542 Cervicalgia: Secondary | ICD-10-CM | POA: Diagnosis not present

## 2017-10-26 DIAGNOSIS — Z79899 Other long term (current) drug therapy: Secondary | ICD-10-CM | POA: Diagnosis not present

## 2017-10-31 DIAGNOSIS — J449 Chronic obstructive pulmonary disease, unspecified: Secondary | ICD-10-CM | POA: Diagnosis not present

## 2017-11-01 DIAGNOSIS — G4733 Obstructive sleep apnea (adult) (pediatric): Secondary | ICD-10-CM | POA: Diagnosis not present

## 2017-11-07 DIAGNOSIS — G4733 Obstructive sleep apnea (adult) (pediatric): Secondary | ICD-10-CM | POA: Diagnosis not present

## 2017-11-14 DIAGNOSIS — M47817 Spondylosis without myelopathy or radiculopathy, lumbosacral region: Secondary | ICD-10-CM | POA: Diagnosis not present

## 2017-11-18 ENCOUNTER — Ambulatory Visit: Payer: Medicare Other | Admitting: Internal Medicine

## 2017-11-23 ENCOUNTER — Encounter: Payer: Self-pay | Admitting: Internal Medicine

## 2017-11-23 ENCOUNTER — Ambulatory Visit (INDEPENDENT_AMBULATORY_CARE_PROVIDER_SITE_OTHER): Payer: Medicare Other | Admitting: Internal Medicine

## 2017-11-23 VITALS — BP 126/68 | HR 80 | Ht 64.0 in | Wt 261.6 lb

## 2017-11-23 DIAGNOSIS — J9612 Chronic respiratory failure with hypercapnia: Secondary | ICD-10-CM

## 2017-11-23 DIAGNOSIS — J9611 Chronic respiratory failure with hypoxia: Secondary | ICD-10-CM | POA: Diagnosis not present

## 2017-11-23 DIAGNOSIS — J449 Chronic obstructive pulmonary disease, unspecified: Secondary | ICD-10-CM | POA: Diagnosis not present

## 2017-11-23 MED ORDER — BUDESONIDE-FORMOTEROL FUMARATE 160-4.5 MCG/ACT IN AERO: 2.0000 | INHALATION_SPRAY | Freq: Two times a day (BID) | RESPIRATORY_TRACT | 0 refills | Status: DC

## 2017-11-23 MED ORDER — BUDESONIDE-FORMOTEROL FUMARATE 160-4.5 MCG/ACT IN AERO: 2.0000 | INHALATION_SPRAY | Freq: Two times a day (BID) | RESPIRATORY_TRACT | 11 refills | Status: DC

## 2017-11-23 MED ORDER — IPRATROPIUM-ALBUTEROL 0.5-2.5 (3) MG/3ML IN SOLN: 3.0000 mL | Freq: Two times a day (BID) | RESPIRATORY_TRACT | 3 refills | Status: DC

## 2017-11-23 NOTE — Progress Notes (Signed)
Subjective:    Patient ID: Renee Noble, female    DOB: 08-05-1955,    MRN: [id number]    Brief patient profile:  1 yowf quit smoking 07/2016   referred by Renee Noble to pulmonary clinic for copd evaluation in 12/2013  With GOLD III criteria and referred back  04/28/2017       History of Present Illness  04/28/2017  New pt ov/Wert re:re- establish for eval of sob/ ? Clear for surgery / maint rx = spiriva dpi Chief Complaint  Patient presents with  . Pulmonary Consult    Referred by Renee Noble, PA for eval of pulmonary hypertension. Pt states she also needs clearance for hernia repair surgery.  She c/o occ non prod cough. She states her breathing is doing well today. She rarely uses her rescue inhaler.    02 sat at rest RA = low 90s Also wearing 02 2.5 lpm hs Walking x 167ft on 02 = 2.5lpm may be 100 ft slow pace = Renee Noble = can't walk 100 yards even at a slow pace at a flat grade s stopping due to sob  / may be uses saba hfa once a month, almost never noct unless flaring with uri  Sleeping one- 2 pillows on cpap 2.5 lpm  Losing wt voluntarily hoping to have abd wall hernia surgery 2019  rec Start stiolto 2 pffs each am  Please see patient coordinator before you leave today  to schedule 2d echo and overnight 02 sat on cpap and your 02 2.5lpm      06/10/2017  f/u ov/Wert re: back to spiriva /advair  2lpm 24/7 x does 3lp pulsed portable / better sobon stiolto but not covered  Chief Complaint  Patient presents with  . Follow-up    feels like her breathing,lungs are better,denies SOB,coughing,use O2 2.5L continuous O2  has not needed saba but doe still  = MMRC3 = can't walk 100 yards even at a slow pace at a flat grade s stopping due to sob   Sleeping ok 2pillows on 2.5 lpm and cpap 16    rec Plan A = Automatic = Bevespi Take 2 puffs first thing in am and then another 2 puffs about 12 hours later and if insurance doesn't cover or you don't like it go back to advair/spriva Work on  inhaler technique:   Plan B = Backup Only use your albuterol as a rescue medication Plan C = Crisis - only use your albuterol nebulizer if you first try Plan B and it fails to help > ok to use the nebulizer up to every 4 hours but if start needing it regularly call for immediate appointment   11/23/2017  f/u ov/Wert re: COPD III  Chief Complaint  Patient presents with  . Follow-up    Pt states she has been doing okay since last visit. States she has been having some digestive problems that has been bothering her x2 weeks.  Dyspnea:  MMRC2 = can't walk a nl pace on a flat grade s sob but does fine slow and flat [employer] 2lpm sats staying above 90% Cough: min dry aggravated by hot weather / ac exposure also if too cool   SABA use: twice weekly never neb 02: sometimes take off 02 at rest sats are low 90s   No obvious day to day or daytime variability or assoc excess/ purulent sputum or mucus plugs or hemoptysis or cp or chest tightness, subjective wheeze or overt sinus or hb symptoms.  Sleeping: cpap / 2lpm without nocturnal  or early am exacerbation  of respiratory  c/o's or need for noct saba. Also denies any obvious fluctuation of symptoms with weather or environmental changes or other aggravating or alleviating factors except as outlined above   No unusual exposure hx or h/o childhood pna/ asthma or knowledge of premature birth.  Current Allergies, Complete Past Medical History, Past Surgical History, Family History, and Social History were reviewed in [employer] record.  ROS  The following are not active complaints unless bolded Hoarseness, sore throat, dysphagia, dental problems, itching, sneezing,  nasal congestion or discharge of excess mucus or purulent secretions, ear ache,   fever, chills, sweats, unintended wt loss or wt gain, classically pleuritic or exertional cp,  orthopnea pnd or arm/hand swelling  or leg swelling, presyncope, palpitations, abdominal  pain, anorexia, nausea, vomiting, diarrhea  or change in bowel habits or change in bladder habits, change in stools or change in urine, dysuria, hematuria,  rash, arthralgias, visual complaints, headache, numbness, weakness or ataxia or problems with walking or coordination,  change in mood or  memory.        Current Meds  Medication Sig  . albuterol (PROVENTIL HFA;VENTOLIN HFA) 108 (90 Base) MCG/ACT inhaler Inhale 1-2 puffs into the lungs every 6 (six) hours as needed for wheezing or shortness of breath.  . clotrimazole-betamethasone (LOTRISONE) cream Apply 1 application topically 2 (two) times daily as needed.  . EMBEDA 30-1.2 MG CPCR Take 1 capsule by mouth 2 (two) times daily. Take 1 capsule by mouth twice daily  . Glycopyrrolate-Formoterol (BEVESPI AEROSPHERE) 9-4.8 MCG/ACT AERO Inhale 2 puffs into the lungs 2 (two) times daily.  [name] Kitchen HUMALOG 100 UNIT/ML injection INJECT 0.15 MILLILITERS (15 UNITS) INTO THE SKIN 3 TIMES DAILY AS NEEDED FOR HIGH BLOOD SUGAR  . ipratropium-albuterol (DUONEB) 0.5-2.5 (3) MG/3ML SOLN Take 3 mLs by nebulization 2 (two) times daily.  [name] Kitchen LANTUS 100 UNIT/ML injection INJECT 0.3MLS (30 UNITS TOTAL) INTO THE SKIN EVERY DAY  . levothyroxine (SYNTHROID, LEVOTHROID) 88 MCG tablet TAKE ONE TABLET BY MOUTH EVERY DAY BEFORE BREAKFAST  . losartan (COZAAR) 25 MG tablet Take 1 tablet by mouth daily.  . Multiple Vitamins-Minerals (CENTRUM SILVER PO) Take by mouth.  . nystatin (MYCOSTATIN/NYSTOP) powder APPLY TOPICALLY TWICE DAILY AS NEEDED FOR YEAST INFECTION  . oxyCODONE (OXY IR/ROXICODONE) 5 MG immediate release tablet Take 5 mg by mouth 2 (two) times daily as needed for breakthrough pain. Take 1 tablet by mouth twice daily as needed  . OXYGEN O2 2lpm with sleep and 2.5 lpm with exertion  Renee Noble  . pravastatin (PRAVACHOL) 20 MG tablet TAKE ONE TABLET BY MOUTH EVERY DAY  . Probiotic Product (PROBIOTIC PO) Take by mouth.  Renee Noble COMFORT INSULIN SYRINGE 30G X 1/2" 1 ML MISC USE  FOUR (4) TIMES DAILY AS DIRECTED  . tiZANidine (ZANAFLEX) 2 MG tablet Take 2-4 mg by mouth every 12 (twelve) hours as needed for muscle spasms.  [name] Kitchen torsemide (DEMADEX) 20 MG tablet TAKE ONE TABLET BY MOUTH EVERY DAY  . varenicline (CHANTIX) 1 MG tablet Take 1 tablet (1 mg total) by mouth 2 (two) times daily.  . [  ipratropium-albuterol (DUONEB) 0.5-2.5 (3) MG/3ML SOLN Take 3 mLs by nebulization 2 (two) times daily.  .     .    .  Objective:   Physical Exam   amb obese of nad  11/23/2017       261  06/10/2017       281   04/28/17 276 lb (125.2 kg)  04/23/17 279 lb (126.6 kg)  04/21/17 279 lb (126.6 kg)    Vital signs reviewed - Note on arrival 02 sats  95% on 2lpm pulsed        HEENT: nl  turbinates bilaterally, and oropharynx. Nl external ear canals without cough reflex - Modified Mallampati Score =   1 / full dentures    NECK :  without JVD/Nodes/TM/ nl carotid upstrokes bilaterally   LUNGS: no acc muscle use,  Nl contour chest with distant mid exp wheezing bilaterally without cough on insp or exp maneuvers   CV:  RRR  no s3 or murmur or increase in P2, and less pitting edema   ABD:  soft and nontender with nl inspiratory excursion in the supine position. No bruits or organomegaly appreciated, bowel sounds nl  MS:  Nl gait/ ext warm without deformities, calf tenderness, cyanosis or clubbing No obvious joint restrictions   SKIN: warm and dry with elephantiasis changes both LE's  NEURO:  alert, approp, nl sensorium with  no motor or cerebellar deficits apparent.           Assessment & Plan:

## 2017-11-23 NOTE — Patient Instructions (Addendum)
Plan A = Automatic = Try symbicort 160 Take 2 puffs first thing in am and then another 2 puffs about 12 hours later.   Work on inhaler technique:  relax and gently blow all the way out then take a nice smooth deep breath back in, triggering the inhaler at same time you start breathing in.  Hold for up to 5 seconds if you can. Blow out thru nose. Rinse and gargle with water when done     Plan B = Backup Only use your albuterol as a rescue medication to be used if you can't catch your breath by resting or doing a relaxed purse lip breathing pattern.  - The less you use it, the better it will work when you need it. - Ok to use the inhaler up to 2 puffs  every 4 hours if you must but call for appointment if use goes up over your usual need - Don't leave home without it !!  (think of it like the spare tire for your car)   Plan C = Crisis - only use your albuterol nebulizer if you first try Plan B and it fails to help > ok to use the nebulizer up to every 4 hours but if start needing it regularly call for immediate appointment   Keep up the good work on the weight loss - if needed increase 02 to keep above sats 90% with exercise   Please schedule a follow up visit in 6 months but call sooner if needed  - add: be sure she stops bevespi while on symbicort but if prefers bevespi go back to it  - also I did not address her gi issues at ov but needs to contact her PCP or be referred to our GI dept - can try maalox plus prn in meantime   .

## 2017-11-24 DIAGNOSIS — M47817 Spondylosis without myelopathy or radiculopathy, lumbosacral region: Secondary | ICD-10-CM | POA: Diagnosis not present

## 2017-11-24 DIAGNOSIS — M549 Dorsalgia, unspecified: Secondary | ICD-10-CM | POA: Diagnosis not present

## 2017-11-24 DIAGNOSIS — G894 Chronic pain syndrome: Secondary | ICD-10-CM | POA: Diagnosis not present

## 2017-11-24 DIAGNOSIS — M542 Cervicalgia: Secondary | ICD-10-CM | POA: Diagnosis not present

## 2017-11-25 ENCOUNTER — Other Ambulatory Visit: Payer: Self-pay | Admitting: Physician Assistant

## 2017-11-25 DIAGNOSIS — G8929 Other chronic pain: Secondary | ICD-10-CM

## 2017-11-25 DIAGNOSIS — G4733 Obstructive sleep apnea (adult) (pediatric): Secondary | ICD-10-CM | POA: Diagnosis not present

## 2017-11-27 ENCOUNTER — Encounter: Payer: Self-pay | Admitting: Internal Medicine

## 2017-11-27 NOTE — Assessment & Plan Note (Addendum)
Body mass index is 44.9 kg/m.  -  trending down/ encouraged though concerned re gi issues (see avs for instructions unique to this ov   Lab Results  Component Value Date   TSH 2.39 09/28/2016     Contributing to gerd risk/ doe/reviewed the need and the process to achieve and maintain neg calorie balance > defer f/u primary care including intermittently monitoring thyroid status

## 2017-11-27 NOTE — Assessment & Plan Note (Signed)
HC03   1/919  = 39  - ono on 2lpm ? On cpap or not done 06/28/17> desat  < 89% x 81.55m so needs cpap titration if no recent study   - Sleep titration done 08/12/17 > needs 3lpm blended in per Dr Bari Mantis interpretation    Adequate control on present rx, reviewed in detail with pt > no change in rx needed  = ok to take off at rest but goal is > 90% 24/7

## 2017-11-27 NOTE — Assessment & Plan Note (Signed)
Trial off advair 12/25/13 as not using it consistently anyway> pt restarted around 02/03/14   - 02/14/2014 PFTs  FEV1  1.22 (48%) ratio 53 p 12% improvement from SABA and dlco 73% corrects to 84  - 02/15/2014  Walked RA  2 laps @ 185 ft each stopped due to  89% /   nl pace / legs gave out same time chest got "tight" relieved immediately at rest  - Spirometry 04/28/2017  FEV1 0.74 (30%)  Ratio 47 with classic curvature p spiriva dpi in am  - 04/28/2017    try stiolto 2 each am   improved but not on formulary  - 06/10/2017   try bevespi 2bid   - 11/23/2017  After extensive coaching inhaler device  effectiveness =    75% and wheezing on bevespi so try symb 160 2bid   I had an extended discussion with the patient reviewing all relevant studies completed to date and  lasting 15 to 20 minutes of a 25 minute visit    See device teaching which extended face to face time for this visit.  Each maintenance medication was reviewed in detail including emphasizing most importantly the difference between maintenance and prns and under what circumstances the prns are to be triggered using an action plan format that is not reflected in the computer generated alphabetically organized AVS which I have not found useful in most complex patients, especially with respiratory illnesses  Please see AVS for specific instructions unique to this visit that I personally wrote and verbalized to the the pt in detail and then reviewed with pt  by my nurse highlighting any  changes in therapy recommended at today's visit to their plan of care.

## 2017-11-28 ENCOUNTER — Telehealth: Payer: Self-pay | Admitting: *Deleted

## 2017-11-28 NOTE — Telephone Encounter (Signed)
-----   Message from Tanda Rockers, MD sent at 11/27/2017  8:00 AM EDT ----- I did not address her gi issues at ov but needs to contact her PCP or be referred to our GI dept - can try maalox plus prn in meantime

## 2017-11-28 NOTE — Telephone Encounter (Signed)
Spoke with the pt and notified of recs per MW  She verbalized understanding  Will let us know if she wants to be referred to GI if OTC med doesn't help

## 2017-11-28 NOTE — Telephone Encounter (Signed)
lmtcb

## 2017-11-29 ENCOUNTER — Ambulatory Visit (INDEPENDENT_AMBULATORY_CARE_PROVIDER_SITE_OTHER): Payer: Medicare Other | Admitting: Physician Assistant

## 2017-11-29 ENCOUNTER — Encounter: Payer: Self-pay | Admitting: Physician Assistant

## 2017-11-29 ENCOUNTER — Other Ambulatory Visit: Payer: Self-pay

## 2017-11-29 VITALS — BP 112/78 | HR 92 | Temp 97.6°F | Resp 15 | Ht 64.0 in | Wt 256.8 lb

## 2017-11-29 DIAGNOSIS — R1110 Vomiting, unspecified: Secondary | ICD-10-CM | POA: Diagnosis not present

## 2017-11-29 LAB — COMPREHENSIVE METABOLIC PANEL
ALT: 7 U/L (ref 0–35)
AST: 12 U/L (ref 0–37)
Albumin: 3.9 g/dL (ref 3.5–5.2)
Alkaline Phosphatase: 69 U/L (ref 39–117)
BUN: 24 mg/dL — ABNORMAL HIGH (ref 6–23)
CO2: 37 mEq/L — ABNORMAL HIGH (ref 19–32)
Calcium: 9 mg/dL (ref 8.4–10.5)
Chloride: 95 mEq/L — ABNORMAL LOW (ref 96–112)
Creatinine, Ser: 1.73 mg/dL — ABNORMAL HIGH (ref 0.40–1.20)
GFR: 31.48 mL/min — ABNORMAL LOW (ref >60.00)
Glucose, Bld: 115 mg/dL — ABNORMAL HIGH (ref 70–99)
Potassium: 3.9 mEq/L (ref 3.5–5.1)
Sodium: 138 mEq/L (ref 135–145)
Total Bilirubin: 0.4 mg/dL (ref 0.2–1.2)
Total Protein: 6.6 g/dL (ref 6.0–8.3)

## 2017-11-29 LAB — CBC WITH DIFFERENTIAL/PLATELET
Basophils Absolute: 0 10*3/uL (ref 0.0–0.1)
Basophils Relative: 0.5 % (ref 0.0–3.0)
Eosinophils Absolute: 0.1 10*3/uL (ref 0.0–0.7)
Eosinophils Relative: 1.2 % (ref 0.0–5.0)
HCT: 39 % (ref 36.0–46.0)
Hemoglobin: 12.5 g/dL (ref 12.0–15.0)
Lymphocytes Relative: 15.1 % (ref 12.0–46.0)
Lymphs Abs: 0.8 10*3/uL (ref 0.7–4.0)
MCHC: 32 g/dL (ref 30.0–36.0)
MCV: 82 fl (ref 78.0–100.0)
Monocytes Absolute: 0.3 10*3/uL (ref 0.1–1.0)
Monocytes Relative: 7 % (ref 3.0–12.0)
Neutro Abs: 3.8 10*3/uL (ref 1.4–7.7)
Neutrophils Relative %: 76.2 % (ref 43.0–77.0)
Platelets: 85 10*3/uL — ABNORMAL LOW (ref 150.0–400.0)
RBC: 4.76 Mil/uL (ref 3.87–5.11)
RDW: 18.5 % — ABNORMAL HIGH (ref 11.5–15.5)
WBC: 5 10*3/uL (ref 4.0–10.5)

## 2017-11-29 LAB — LIPASE: Lipase: 5 U/L — ABNORMAL LOW (ref 11.0–59.0)

## 2017-11-29 LAB — H. PYLORI ANTIBODY, IGG: H Pylori IgG: NEGATIVE

## 2017-11-29 MED ORDER — ONDANSETRON HCL 4 MG PO TABS: 4.0000 mg | ORAL_TABLET | Freq: Three times a day (TID) | ORAL | 0 refills | Status: DC | PRN

## 2017-11-29 NOTE — Progress Notes (Signed)
Patient presents to clinic today c/o 2 weeks of intermittent vomiting associated with epigastric discomfort. Denies fever, chills, malaise or faitgue. Denies change in bowel or bladder habits. Denies change in diet or medication. States that she notes the vomiting happens 76mto 1 hr after eating. Can also sometimes have dry heaves not relating to meals. Denies heart burn or nausea. Denies melena, hematochezia or hematemesis. Is a Diabetic, on insulin therapy with history of CKD and neuropathy but without history of gastroparesis.  Lab Results  Component Value Date   HGBA1C 6.0 05/25/2017    Past Medical History:  Diagnosis Date  . Abdominal mass of other site   . Cervical compression fracture ([place]   . Chronic kidney disease, stage 3 (HCC)    Borderline Stage 2-3  . Chronic lower limb pain   . COPD (chronic obstructive pulmonary disease) ([place]   . CTS (carpal tunnel syndrome)   . Depression   . Diabetes ([place]    Type II  . Fibromyalgia   . Hepatitis C   . Hypercholesteremia   . Hypertension   . Hypothyroidism   . Morbid obesity ([place]   . Neuropathy    Diabetes  . OSA (obstructive sleep apnea)   . Osteoarthritis   . Renal cancer ([place]    Left Kidney Removed  . RLS (restless legs syndrome)   . Syncope   . Venous stasis     Current Outpatient Medications on File Prior to Visit  Medication Sig Dispense Refill  . albuterol (PROVENTIL HFA;VENTOLIN HFA) 108 (90 Base) MCG/ACT inhaler Inhale 1-2 puffs into the lungs every 6 (six) hours as needed for wheezing or shortness of breath. 6.7 g 5  . budesonide-formoterol (SYMBICORT) 160-4.5 MCG/ACT inhaler Inhale 2 puffs into the lungs 2 (two) times daily. 1 Inhaler 0  . clotrimazole-betamethasone (LOTRISONE) cream Apply 1 application topically 2 (two) times daily as needed. 45 g 0  . EMBEDA 30-1.2 MG CPCR Take 1 capsule by mouth 2 (two) times daily. Take 1 capsule by mouth twice daily    . HUMALOG 100 UNIT/ML injection INJECT 0.15  MILLILITERS (15 UNITS) INTO THE SKIN 3 TIMES DAILY AS NEEDED FOR HIGH BLOOD SUGAR 10 mL 2  . ipratropium-albuterol (DUONEB) 0.5-2.5 (3) MG/3ML SOLN Take 3 mLs by nebulization 2 (two) times daily. 360 mL 3  . LANTUS 100 UNIT/ML injection INJECT 0.3MLS (30 UNITS TOTAL) INTO THE SKIN EVERY DAY 30 mL 3  . levothyroxine (SYNTHROID, LEVOTHROID) 88 MCG tablet TAKE ONE TABLET BY MOUTH EVERY DAY BEFORE BREAKFAST 90 tablet 1  . losartan (COZAAR) 25 MG tablet Take 1 tablet by mouth daily.    . Multiple Vitamins-Minerals (CENTRUM SILVER PO) Take by mouth.    . nortriptyline (PAMELOR) 25 MG capsule TAKE 1 CAPSULE BY MOUTH AT BEDTIME 30 capsule 5  . nortriptyline (PAMELOR) 75 MG capsule TAKE 1 CAPSULE BY MOUTH AT BEDTIME 30 capsule 5  . nystatin (MYCOSTATIN/NYSTOP) powder APPLY TOPICALLY TWICE DAILY AS NEEDED FOR YEAST INFECTION 15 g 2  . oxyCODONE (OXY IR/ROXICODONE) 5 MG immediate release tablet Take 5 mg by mouth 2 (two) times daily as needed for breakthrough pain. Take 1 tablet by mouth twice daily as needed    . OXYGEN O2 2lpm with sleep and 2.5 lpm with exertion  [employer]    . pravastatin (PRAVACHOL) 20 MG tablet TAKE ONE TABLET BY MOUTH EVERY DAY 90 tablet 1  . Probiotic Product (PROBIOTIC PO) Take by mouth.    .Haig ProphetCOMFORT  INSULIN SYRINGE 30G X 1/2" 1 ML MISC USE FOUR (4) TIMES DAILY AS DIRECTED 100 each 3  . tiZANidine (ZANAFLEX) 2 MG tablet Take 2-4 mg by mouth every 12 (twelve) hours as needed for muscle spasms.    [name] Kitchen torsemide (DEMADEX) 20 MG tablet TAKE ONE TABLET BY MOUTH EVERY DAY 30 tablet 0  . varenicline (CHANTIX) 1 MG tablet Take 1 tablet (1 mg total) by mouth 2 (two) times daily. 60 tablet 2   No current facility-administered medications on file prior to visit.     Allergies  Allergen Reactions  . Gabapentin Anaphylaxis  . Lyrica [Pregabalin] Shortness Of Breath    Trouble breathing  . Ketorolac Tromethamine Hives  . Lisinopril Cough    Family History  Problem Relation Age of  Onset  . Heart attack Mother 81       Deceased  . Heart disease Mother   . Emphysema Mother   . Alcoholism Mother   . COPD Father 63       Deceased  . Emphysema Father   . Alcoholism Father   . Esophageal varices Father   . Alcoholism Paternal Grandfather   . Diabetes Maternal Grandmother   . Heart disease Maternal Grandmother   . Lung cancer Maternal Grandfather   . Emphysema Maternal Grandfather   . Brain cancer Maternal Aunt   . Diabetes Sister   . Heart defect Sister   . Heart defect Sister   . Obesity Son   . Breast cancer Maternal Aunt     Social History   Socioeconomic History  . Marital status: Single    Spouse name: Not on file  . Number of children: Not on file  . Years of education: Not on file  . Highest education level: Not on file  Occupational History  . Not on file  Social Needs  . Financial resource strain: Not on file  . Food insecurity:    Worry: Not on file    Inability: Not on file  . Transportation needs:    Medical: Not on file    Non-medical: Not on file  Tobacco Use  . Smoking status: Former Smoker    Packs/day: 2.50    Years: 45.00    Pack years: 112.50    Types: Cigarettes    Last attempt to quit: 07/21/2016    Years since quitting: 1.3  . Smokeless tobacco: Never Used  Substance and Sexual Activity  . Alcohol use: No  . Drug use: No  . Sexual activity: Never  Lifestyle  . Physical activity:    Days per week: Not on file    Minutes per session: Not on file  . Stress: Not on file  Relationships  . Social connections:    Talks on phone: Not on file    Gets together: Not on file    Attends religious service: Not on file    Active member of club or organization: Not on file    Attends meetings of clubs or organizations: Not on file    Relationship status: Not on file  Other Topics Concern  . Not on file  Social History Narrative  . Not on file   Review of Systems - See HPI.  All other ROS are negative.  BP 112/78   Pulse  92   Temp 97.6 F (36.4 C) (Oral)   Resp 15   Ht '5\' 4"'$  (1.626 m)   Wt 256 lb 12.8 oz (116.5 kg)   SpO2 95%  BMI 44.08 kg/m   Physical Exam  Constitutional: She is oriented to person, place, and time. She appears well-developed and well-nourished.  Non-toxic appearance.  HENT:  Head: Normocephalic and atraumatic.  Cardiovascular: Normal rate and regular rhythm.  Pulmonary/Chest: Effort normal and breath sounds normal.  Abdominal: Soft. Bowel sounds are normal. She exhibits mass (chronic left-sided ventral hernia -- very large but reducible. Patient has refused repair previously.). She exhibits no pulsatile midline mass. There is tenderness in the epigastric area.  Neurological: She is alert and oriented to person, place, and time.  Psychiatric: She has a normal mood and affect.   Assessment/Plan: 1. Intermittent vomiting Assess for mild pancreatitis versus more chronic issue developing such as diabetic gastroparesis. Exam with very mild epigastric tenderness to deep palpation only. Chronic left-sided ventral hernia again noted. No pain and is reducible. Discussed need for general surgery previously but patient declines. Will check lab panel as listed below. Start very small portion sizes. Zofran for nausea. Will consider CT versus GI referral for gastric emptying study pending results.   - CBC w/Diff - Comp Met (CMET) - Lipase - H. pylori antibody, IgG - ondansetron (ZOFRAN) 4 MG tablet; Take 1 tablet (4 mg total) by mouth every 8 (eight) hours as needed for nausea or vomiting.  Dispense: 20 tablet; Refill: 0   [name], PA-C

## 2017-11-29 NOTE — Patient Instructions (Addendum)
Please go to the lab today for blood work.  I will call you with your results. We will alter treatment regimen(s) if indicated by your results.   Use the Zofran as directed if needed for nausea and vomiting. Eat very small portions slowly, splitting up meals into two eating episodes and hour apart.    I am setting you up with Gastroenterology for assessment and potentially a gastric emptying study as I am concerned about diabetic gastroparesis. We will work on this once results are in and have been reviewed.

## 2017-11-30 ENCOUNTER — Other Ambulatory Visit: Payer: Self-pay | Admitting: Physician Assistant

## 2017-11-30 ENCOUNTER — Encounter: Payer: Self-pay | Admitting: Gastroenterology

## 2017-11-30 DIAGNOSIS — R1110 Vomiting, unspecified: Secondary | ICD-10-CM

## 2017-11-30 DIAGNOSIS — E1143 Type 2 diabetes mellitus with diabetic autonomic (poly)neuropathy: Secondary | ICD-10-CM

## 2017-11-30 DIAGNOSIS — R7989 Other specified abnormal findings of blood chemistry: Secondary | ICD-10-CM

## 2017-11-30 DIAGNOSIS — J449 Chronic obstructive pulmonary disease, unspecified: Secondary | ICD-10-CM | POA: Diagnosis not present

## 2017-11-30 DIAGNOSIS — K3184 Gastroparesis: Principal | ICD-10-CM

## 2017-12-02 ENCOUNTER — Other Ambulatory Visit: Payer: Self-pay | Admitting: Physician Assistant

## 2017-12-02 ENCOUNTER — Other Ambulatory Visit (INDEPENDENT_AMBULATORY_CARE_PROVIDER_SITE_OTHER): Payer: Medicare Other

## 2017-12-02 DIAGNOSIS — R7989 Other specified abnormal findings of blood chemistry: Secondary | ICD-10-CM

## 2017-12-02 LAB — BASIC METABOLIC PANEL WITH GFR
BUN: 26 mg/dL — ABNORMAL HIGH (ref 6–23)
CO2: 36 meq/L — ABNORMAL HIGH (ref 19–32)
Calcium: 9.3 mg/dL (ref 8.4–10.5)
Chloride: 95 meq/L — ABNORMAL LOW (ref 96–112)
Creatinine, Ser: 1.82 mg/dL — ABNORMAL HIGH (ref 0.40–1.20)
GFR: 29.69 mL/min — ABNORMAL LOW
Glucose, Bld: 91 mg/dL (ref 70–99)
Potassium: 3.8 meq/L (ref 3.5–5.1)
Sodium: 138 meq/L (ref 135–145)

## 2017-12-05 ENCOUNTER — Other Ambulatory Visit: Payer: Self-pay | Admitting: Physician Assistant

## 2017-12-05 DIAGNOSIS — Z794 Long term (current) use of insulin: Principal | ICD-10-CM

## 2017-12-05 DIAGNOSIS — E119 Type 2 diabetes mellitus without complications: Secondary | ICD-10-CM

## 2017-12-07 DIAGNOSIS — G4733 Obstructive sleep apnea (adult) (pediatric): Secondary | ICD-10-CM | POA: Diagnosis not present

## 2017-12-08 ENCOUNTER — Telehealth: Payer: Self-pay | Admitting: Emergency Medicine

## 2017-12-08 ENCOUNTER — Other Ambulatory Visit: Payer: Self-pay | Admitting: Emergency Medicine

## 2017-12-08 DIAGNOSIS — R609 Edema, unspecified: Secondary | ICD-10-CM

## 2017-12-08 DIAGNOSIS — N189 Chronic kidney disease, unspecified: Secondary | ICD-10-CM

## 2017-12-08 MED ORDER — LEVOTHYROXINE SODIUM 88 MCG PO TABS: 88.0000 ug | ORAL_TABLET | Freq: Every day | ORAL | 1 refills | Status: DC

## 2017-12-08 NOTE — Telephone Encounter (Signed)
Called patient to check on her to make sure she was hydrating well. She states she still having some dry heaves but is starting to feel better. She states she is drinking about 64-96 oz of water. Scheduled lab visit on 12/09/17 to recheck creatinine level.

## 2017-12-09 ENCOUNTER — Other Ambulatory Visit (INDEPENDENT_AMBULATORY_CARE_PROVIDER_SITE_OTHER): Payer: Medicare Other

## 2017-12-09 DIAGNOSIS — N189 Chronic kidney disease, unspecified: Secondary | ICD-10-CM | POA: Diagnosis not present

## 2017-12-09 LAB — COMPREHENSIVE METABOLIC PANEL
ALBUMIN: 3.6 g/dL (ref 3.5–5.2)
ALT: 6 U/L (ref 0–35)
AST: 10 U/L (ref 0–37)
Alkaline Phosphatase: 63 U/L (ref 39–117)
BILIRUBIN TOTAL: 0.4 mg/dL (ref 0.2–1.2)
BUN: 20 mg/dL (ref 6–23)
CHLORIDE: 102 meq/L (ref 96–112)
CO2: 34 mEq/L — ABNORMAL HIGH (ref 19–32)
CREATININE: 1.52 mg/dL — AB (ref 0.40–1.20)
Calcium: 8.8 mg/dL (ref 8.4–10.5)
GFR: 36.55 mL/min — ABNORMAL LOW (ref >60.00)
Glucose, Bld: 196 mg/dL — ABNORMAL HIGH (ref 70–99)
Potassium: 4.9 mEq/L (ref 3.5–5.1)
SODIUM: 140 meq/L (ref 135–145)
Total Protein: 6.3 g/dL (ref 6.0–8.3)

## 2017-12-12 ENCOUNTER — Other Ambulatory Visit: Payer: Self-pay

## 2017-12-12 DIAGNOSIS — R7989 Other specified abnormal findings of blood chemistry: Secondary | ICD-10-CM

## 2017-12-19 DIAGNOSIS — Z905 Acquired absence of kidney: Secondary | ICD-10-CM | POA: Diagnosis not present

## 2017-12-19 DIAGNOSIS — N179 Acute kidney failure, unspecified: Secondary | ICD-10-CM | POA: Diagnosis not present

## 2017-12-19 DIAGNOSIS — I129 Hypertensive chronic kidney disease with stage 1 through stage 4 chronic kidney disease, or unspecified chronic kidney disease: Secondary | ICD-10-CM | POA: Diagnosis not present

## 2017-12-19 DIAGNOSIS — N189 Chronic kidney disease, unspecified: Secondary | ICD-10-CM | POA: Diagnosis not present

## 2017-12-19 DIAGNOSIS — E1129 Type 2 diabetes mellitus with other diabetic kidney complication: Secondary | ICD-10-CM | POA: Diagnosis not present

## 2017-12-22 DIAGNOSIS — Z79891 Long term (current) use of opiate analgesic: Secondary | ICD-10-CM | POA: Diagnosis not present

## 2017-12-22 DIAGNOSIS — G894 Chronic pain syndrome: Secondary | ICD-10-CM | POA: Diagnosis not present

## 2017-12-22 DIAGNOSIS — Z79899 Other long term (current) drug therapy: Secondary | ICD-10-CM | POA: Diagnosis not present

## 2017-12-22 DIAGNOSIS — M47817 Spondylosis without myelopathy or radiculopathy, lumbosacral region: Secondary | ICD-10-CM | POA: Diagnosis not present

## 2017-12-22 DIAGNOSIS — M542 Cervicalgia: Secondary | ICD-10-CM | POA: Diagnosis not present

## 2017-12-26 ENCOUNTER — Ambulatory Visit: Payer: Medicare Other | Admitting: Gastroenterology

## 2017-12-28 DIAGNOSIS — G4733 Obstructive sleep apnea (adult) (pediatric): Secondary | ICD-10-CM | POA: Diagnosis not present

## 2017-12-29 ENCOUNTER — Encounter: Payer: Self-pay | Admitting: Gastroenterology

## 2017-12-29 ENCOUNTER — Other Ambulatory Visit: Payer: Self-pay | Admitting: Physician Assistant

## 2017-12-29 DIAGNOSIS — Z794 Long term (current) use of insulin: Principal | ICD-10-CM

## 2017-12-29 DIAGNOSIS — E119 Type 2 diabetes mellitus without complications: Secondary | ICD-10-CM

## 2017-12-31 DIAGNOSIS — J449 Chronic obstructive pulmonary disease, unspecified: Secondary | ICD-10-CM | POA: Diagnosis not present

## 2018-01-06 ENCOUNTER — Ambulatory Visit: Payer: Medicare Other | Admitting: Gastroenterology

## 2018-01-07 DIAGNOSIS — G4733 Obstructive sleep apnea (adult) (pediatric): Secondary | ICD-10-CM | POA: Diagnosis not present

## 2018-01-10 ENCOUNTER — Other Ambulatory Visit: Payer: Self-pay

## 2018-01-10 NOTE — Patient Outreach (Signed)
[facility]) Care Management  01/10/2018  Renee Noble 1956-01-08 [id number]   Medication Adherence call to Renee Noble patient did not answer she is due on Losartan 25 mg. [facility] said they have a prescription ready for her. Renee Noble is showing past due under [place].  [place] Management Direct Dial [phone]  Fax [phone] [email or web address]

## 2018-01-17 ENCOUNTER — Ambulatory Visit (INDEPENDENT_AMBULATORY_CARE_PROVIDER_SITE_OTHER): Payer: Medicare Other | Admitting: Gastroenterology

## 2018-01-17 ENCOUNTER — Encounter: Payer: Self-pay | Admitting: Gastroenterology

## 2018-01-17 VITALS — BP 120/78 | HR 85 | Ht 64.0 in | Wt 256.1 lb

## 2018-01-17 DIAGNOSIS — R768 Other specified abnormal immunological findings in serum: Secondary | ICD-10-CM | POA: Diagnosis not present

## 2018-01-17 DIAGNOSIS — R109 Unspecified abdominal pain: Secondary | ICD-10-CM

## 2018-01-17 DIAGNOSIS — R112 Nausea with vomiting, unspecified: Secondary | ICD-10-CM

## 2018-01-17 DIAGNOSIS — D696 Thrombocytopenia, unspecified: Secondary | ICD-10-CM | POA: Diagnosis not present

## 2018-01-17 MED ORDER — METOCLOPRAMIDE HCL 10 MG PO TABS: 10.0000 mg | ORAL_TABLET | Freq: Four times a day (QID) | ORAL | 1 refills | Status: DC | PRN

## 2018-01-17 NOTE — Progress Notes (Signed)
Chief Complaint: Nausea, vomiting  Referring Provider:     [name], PA-C   HPI:     Renee Noble is a 62 y.o. female referred to the Gastroenterology Clinic for evaluation of nausea, vomiting, and abdominal pain.   She states she has had daily n/v for 3+ weeks. Sxs occur throughout the day. Tolerates water, but otherwise not tolerating any PO. Not even tolerating pureed foods. Vomits approx 15-30 mins after eating. Can awaken at night with dry heaves and nausea. MEG pain started first, then n/v. Was prescribed Zofran with 80% improvement, but has since run out of medication and has not called for refill. No HB. No hx of GERD. No preceding incident, infection, Abx, travel, etc. Does have known abdominal incisional wall hernia, s/p repair x2 in LUQ, which has increased in size with the recent retching. No pain at that site. No change in bowel habits, overt GI blood loss, f/c. Takes all meds as prescribed. Has lost 110# over the last 10 months intentionally with dietary mods and increased activity/exercise. Has only lost 3# in the last 3 weeks with onset of sxs. Torsemide was stopped after onset of sxs/held in response to sxs.   EGD and colonoscopy completed approx 2 years ago by Dr. Alonza Bogus in [facility]. Unclear of results. She has paperwork at home and will try to obtain for my review.   Hx of HCV s/p tx in 2015 with reported SVR. HCV in 2017 non detectable at [facility].   Imaging in 2016 suspicious for cirrhosis along with stable marked splenomegaly. Recent LAEs o/w normal, with normal Albumin. Long history of thrombocytopenia with recent PLTs 85, and as low as 56 in 2017. No previous liver bx.    Past Medical History:  Diagnosis Date  . Abdominal mass of other site   . Cervical compression fracture ([place])   . Chronic kidney disease, stage 3 (HCC)    Borderline Stage 2-3  . Chronic lower limb pain   . COPD (chronic obstructive pulmonary disease) ([place])   .  CTS (carpal tunnel syndrome)   . Depression   . Diabetes ([place])    Type II  . Fibromyalgia   . Hepatitis C    cured last year (2018)  . Hypercholesteremia   . Hypertension   . Hypothyroidism   . Morbid obesity ([place])   . Neuropathy    Diabetes  . OSA (obstructive sleep apnea)   . Osteoarthritis   . Renal cancer ([place])    Left Kidney Removed  . RLS (restless legs syndrome)   . Syncope   . Venous stasis      Past Surgical History:  Procedure Laterality Date  . ABDOMINAL HERNIA REPAIR     x2  . ABDOMINAL HYSTERECTOMY    . BLADDER SUSPENSION    . CHOLECYSTECTOMY    . CYST EXCISION     Head  . INCISIONAL HERNIA REPAIR    . KNEE ARTHROSCOPY     Bilateral  . NEPHRECTOMY     Left  . TONSILLECTOMY    . TOOTH EXTRACTION    . UMBILICAL HERNIA REPAIR    . VAGINA SURGERY    . WISDOM TOOTH EXTRACTION     Family History  Problem Relation Age of Onset  . Heart attack Mother 56       Deceased  . Heart disease Mother   . Emphysema Mother   .  Alcoholism Mother   . COPD Father 15       Deceased  . Emphysema Father   . Alcoholism Father   . Esophageal varices Father   . Alcoholism Paternal Grandfather   . Diabetes Maternal Grandmother   . Heart disease Maternal Grandmother   . Lung cancer Maternal Grandfather   . Emphysema Maternal Grandfather   . Brain cancer Maternal Aunt   . Diabetes Sister   . Heart defect Sister   . Cancer Sister        was told her sister had cancer but beat it and dont know which one   . Heart defect Sister   . Obesity Son   . Breast cancer Maternal Aunt   . Colon cancer Neg Hx   . Esophageal cancer Neg Hx    Social History   Tobacco Use  . Smoking status: Former Smoker    Packs/day: 2.50    Years: 45.00    Pack years: 112.50    Types: Cigarettes    Last attempt to quit: 07/21/2016    Years since quitting: 1.4  . Smokeless tobacco: Never Used  Substance Use Topics  . Alcohol use: No  . Drug use: No   Current Outpatient Medications    Medication Sig Dispense Refill  . albuterol (PROVENTIL HFA;VENTOLIN HFA) 108 (90 Base) MCG/ACT inhaler Inhale 1-2 puffs into the lungs every 6 (six) hours as needed for wheezing or shortness of breath. 6.7 g 5  . budesonide-formoterol (SYMBICORT) 160-4.5 MCG/ACT inhaler Inhale 2 puffs into the lungs 2 (two) times daily. 1 Inhaler 0  . EMBEDA 30-1.2 MG CPCR Take 1 capsule by mouth 2 (two) times daily. Take 1 capsule by mouth twice daily    . HUMALOG 100 UNIT/ML injection INJECT 0.15 ML (15 UNITS) INTO THE SKIN 3 TIMES DAILY AS NEEDED FOR HIGH BLOOD SUGAR 10 mL 0  . ipratropium-albuterol (DUONEB) 0.5-2.5 (3) MG/3ML SOLN Take 3 mLs by nebulization 2 (two) times daily. (Patient taking differently: Take 3 mLs by nebulization 2 (two) times daily as needed. ) 360 mL 3  . LANTUS 100 UNIT/ML injection INJECT 0.3MLS (30 UNITS TOTAL) INTO THE SKIN EVERY DAY 30 mL 3  . levothyroxine (SYNTHROID, LEVOTHROID) 88 MCG tablet Take 1 tablet (88 mcg total) by mouth daily before breakfast. 90 tablet 1  . losartan (COZAAR) 25 MG tablet Take 1 tablet by mouth daily.    . Multiple Vitamins-Minerals (CENTRUM SILVER PO) Take by mouth.    . nortriptyline (PAMELOR) 25 MG capsule TAKE 1 CAPSULE BY MOUTH AT BEDTIME 30 capsule 5  . nortriptyline (PAMELOR) 75 MG capsule TAKE 1 CAPSULE BY MOUTH AT BEDTIME 30 capsule 5  . nystatin (MYCOSTATIN/NYSTOP) powder APPLY TOPICALLY TWICE DAILY AS NEEDED FOR YEAST INFECTION 15 g 2  . oxyCODONE (OXY IR/ROXICODONE) 5 MG immediate release tablet Take 5 mg by mouth 2 (two) times daily as needed for breakthrough pain. Take 1 tablet by mouth twice daily as needed    . OXYGEN O2 2lpm with sleep and 2.5 lpm with exertion  [employer]    . pravastatin (PRAVACHOL) 20 MG tablet TAKE ONE TABLET BY MOUTH EVERY DAY 90 tablet 1  . Probiotic Product (PROBIOTIC PO) Take by mouth.    Haig Prophet COMFORT INSULIN SYRINGE 30G X 1/2" 1 ML MISC USE FOUR (4) TIMES DAILY AS DIRECTED 100 each 3  . tiZANidine (ZANAFLEX) 2  MG tablet Take 2-4 mg by mouth every 12 (twelve) hours as needed for muscle spasms.    [name] Kitchen  varenicline (CHANTIX) 1 MG tablet Take 1 tablet (1 mg total) by mouth 2 (two) times daily. 60 tablet 2  . clotrimazole-betamethasone (LOTRISONE) cream Apply 1 application topically 2 (two) times daily as needed. 45 g 0  . ondansetron (ZOFRAN) 4 MG tablet Take 1 tablet (4 mg total) by mouth every 8 (eight) hours as needed for nausea or vomiting. (Patient not taking: Reported on 01/17/2018) 20 tablet 0  . torsemide (DEMADEX) 20 MG tablet TAKE ONE TABLET BY MOUTH EVERY DAY (Patient not taking: Reported on 01/17/2018) 30 tablet 0   No current facility-administered medications for this visit.    Allergies  Allergen Reactions  . Gabapentin Anaphylaxis  . Lyrica [Pregabalin] Shortness Of Breath    Trouble breathing  . Ketorolac Tromethamine Hives  . Lisinopril Cough     Review of Systems: All systems reviewed and negative except where noted in HPI.     Physical Exam:    Wt Readings from Last 3 Encounters:  01/17/18 256 lb 2 oz (116.2 kg)  11/29/17 256 lb 12.8 oz (116.5 kg)  11/23/17 261 lb 9.6 oz (118.7 kg)    BP 120/78   Pulse 85   Ht 5\' 4"  (1.626 m)   Wt 256 lb 2 oz (116.2 kg)   BMI 43.96 kg/m  Constitutional:  Pleasant, in no acute distress.  Psychiatric: Normal mood and affect. Behavior is normal. EENT: Nasal cannula in place. Pupils normal.  Conjunctivae are normal. No scleral icterus. Neck supple. No cervical LAD. Cardiovascular: Normal rate, regular rhythm. No edema Pulmonary/chest: Effort normal and breath sounds normal. No wheezing, rales or rhonchi. Abdominal: Soft. Hernia in LUQ with mild TTP without rebound or guarding. Smaller hernia in RUQ without TTP. Abdomen o/w nontender. Bowel sounds active throughout. No hepatomegaly. Neurological: Alert and oriented to person place and time. Skin: Skin is warm and dry. No rashes noted.   ASSESSMENT AND PLAN;    1) Nausea,  Vomiting: Discussed ddx for prolonged nausea/vomiting to include gastroparesis, which could be acute, post infectious vs chronic DM gastroparesis, vs luminal (ie, GOO) or mucosal (ie, PUD) and less likely intracranial given acute onset. Offered EGD to eval for luminal/mucosal etiology, which she politely declined in favor of medical management alone at this time. Will trial course of Reglan to stimulate gastric motility, which hopefully is just some delayed emptying 2/2 recent GI insult (ie, infectious enteritis with post infectious gastroparesis) rather than early diabetic gastroparesis.   - Reglan 10 mg PO prn Q6hours- will take scheduled BID to start, with up to 2 additional/day for gastric stimulation, then, if improving, titrate to prn only - Do not take beyond 8 weeks - Discussed medication ADRs at length - Low fat, low fiber diet for now until tolerating PO at baseline, then resume previous diet - If no improvement, can again discuss EGD to eval for mucosal/luminal etiology - Discussed GE study, which may be reduced utility given the intolerance to any PO and short interval post prandial emesis. She declined this study at this time  2) Cirrhosis by imaging CT in 2016 with cirrhotic appearing liver and splenomegaly. No subsequent imaging. Hx of HCV treated by [facility] in 2015 with SVR on labs in 2017. Dicussed cirrhosis at length, to include repeat imaging via RUQ [place], additional labs to eval for etiology (NAFLD, HCV, vs concomittant hepatic disease), and labs to assess hepatic function (no INR in EHR), but she politely declined at this time in favor of tx of acute issues only  for now. At the least, reasonable to do [facility] screening studies, but again she declined at this time.    3) Hx of HCV s/p tx: Treatment completed with [facility] and SVR on labs in 2017 Potentially c/b cirrhosis by imaging in 2016. Unclear the extent this was evaluated by previous GI based on available notes.  4)  Thrombocytopenia: Stable since at least 2017. Unclear if longer hx of this or if actually 2/2 impaired hepatic synthetic fx.   Will try to obtain old records including EGD/colonoscopy from outside facility.  Lavena Bullion, DO, FACG  01/17/2018, 2:22 PM   [name], PA-C

## 2018-01-17 NOTE — Patient Instructions (Addendum)
If you are age 64 or older, your body mass index should be between 23-30. Your Body mass index is 43.96 kg/m. If this is out of the aforementioned range listed, please consider follow up with your Primary Care Provider.  If you are age 1 or younger, your body mass index should be between 19-25. Your Body mass index is 43.96 kg/m. If this is out of the aformentioned range listed, please consider follow up with your Primary Care Provider.   We have sent the following medications to your pharmacy for you to pick up at your convenience: Reglan 10mg  by mouth every 6 hours as needed for nausea.  It was a pleasure to see you today!  Vito Cirigliano, D.O.  Low-Fat Diet What do I need to know about this diet?  Eat a low-fat diet. ? Reduce your fat intake to less than 20-30% of your total daily calories. This is less than 50-60 g of fat per day. ? Remember that you need some fat in your diet. Ask your dietician what your daily goal should be. ? Choose nonfat and low-fat healthy foods. Look for the words "nonfat," "low fat," or "fat free." ? As a guide, look on the label and choose foods with less than 3 g of fat per serving. Eat only one serving.  Avoid alcohol.  Do not smoke. If you need help quitting, talk with your health care provider.  Eat small frequent meals instead of three large heavy meals. What foods can I eat? Grains Include healthy grains and starches such as potatoes, wheat bread, fiber-rich cereal, and brown rice. Choose whole grain options whenever possible. In adults, whole grains should account for 45-65% of your daily calories. Fruits and Vegetables Eat plenty of fruits and vegetables. Fresh fruits and vegetables add fiber to your diet. Meats and Other Protein Sources Eat lean meat such as chicken and pork. Trim any fat off of meat before cooking it. Eggs, fish, and beans are other sources of protein. In adults, these foods should account for 10-35% of your daily  calories. Dairy Choose low-fat milk and dairy options. Dairy includes fat and protein, as well as calcium. Fats and Oils Limit high-fat foods such as fried foods, sweets, baked goods, sugary drinks. Other Creamy sauces and condiments, such as mayonnaise, can add extra fat. Think about whether or not you need to use them, or use smaller amounts or low fat options. What foods are not recommended?  High fat foods, such as: ? [employer]. ? Ice cream. ? [place] toast. ? Sweet rolls. ? Pizza. ? Cheese bread. ? Foods covered with batter, butter, creamy sauces, or cheese. ? Fried foods. ? Sugary drinks and desserts.  Foods that cause gas or bloating This information is not intended to replace advice given to you by your health care provider. Make sure you discuss any questions you have with your health care provider. Document Released: 05/08/2013 Document Revised: 10/09/2015 Document Reviewed: 04/16/2013 Elsevier Interactive Patient Education  2017 Elsevier Inc.  Low-Fiber Diet Fiber is found in fruits, vegetables, and whole grains. A low-fiber diet restricts fibrous foods that are not digested in the small intestine. A diet containing about 10-15 grams of fiber per day is considered low fiber. Low-fiber diets may be used to:  Promote healing and rest the bowel during intestinal flare-ups.  Prevent blockage of a partially obstructed or narrowed gastrointestinal tract.  Reduce fecal weight and volume.  Slow the movement of feces.  You may be on a  low-fiber diet as a transitional diet following surgery, after an injury (trauma), or because of a short (acute) or lifelong (chronic) illness. Your health care provider will determine the length of time you need to stay on this diet. What do I need to know about a low-fiber diet? Always check the fiber content on the packaging's Nutrition Facts label, especially on foods from the grains list. Ask your dietitian if you have questions about  specific foods that are related to your condition, especially if the food is not listed below. In general, a low-fiber food will have less than 2 g of fiber. What foods can I eat? Grains All breads and crackers made with white flour. Sweet rolls, doughnuts, waffles, pancakes, [place] toast, bagels. Pretzels, Melba toast, zwieback. Well-cooked cereals, such as cornmeal, farina, or cream cereals. Dry cereals that do not contain whole grains, fruit, or nuts, such as refined corn, wheat, rice, and oat cereals. Potatoes prepared any way without skins, plain pastas and noodles, refined white rice. Use white flour for baking and making sauces. Use allowed list of grains for casseroles, dumplings, and puddings. Vegetables Strained tomato and vegetable juices. Fresh lettuce, cucumber, spinach. Well-cooked (no skin or pulp) or canned vegetables, such as asparagus, bean sprouts, beets, carrots, green beans, mushrooms, potatoes, pumpkin, spinach, yellow squash, tomato sauce/puree, turnips, yams, and zucchini. Keep servings limited to  cup. Fruits All fruit juices except prune juice. Cooked or canned fruits without skin and seeds, such as applesauce, apricots, cherries, fruit cocktail, grapefruit, grapes, mandarin oranges, melons, peaches, pears, pineapple, and plums. Fresh fruits without skin, such as apricots, avocados, bananas, melons, pineapple, nectarines, and peaches. Keep servings limited to  cup or 1 piece. Meat and Other Protein Sources Ground or well-cooked tender beef, ham, veal, lamb, pork, or poultry. Eggs, plain cheese. Fish, oysters, shrimp, lobster, and other seafood. Liver, organ meats. Smooth nut butters. Dairy All milk products and alternative dairy substitutes, such as soy, rice, almond, and coconut, not containing added whole nuts, seeds, or added fruit. Beverages Decaf coffee, fruit, and vegetable juices or smoothies (small amounts, with no pulp or skins, and with fruits from allowed list),  sports drinks, herbal tea. Condiments Ketchup, mustard, vinegar, cream sauce, cheese sauce, cocoa powder. Spices in moderation, such as allspice, basil, bay leaves, celery powder or leaves, cinnamon, cumin powder, curry powder, ginger, mace, marjoram, onion or garlic powder, oregano, paprika, parsley flakes, ground pepper, rosemary, sage, savory, tarragon, thyme, and turmeric. Sweets and Desserts Plain cakes and cookies, pie made with allowed fruit, pudding, custard, cream pie. Gelatin, fruit, ice, sherbet, frozen ice pops. Ice cream, ice milk without nuts. Plain hard candy, honey, jelly, molasses, syrup, sugar, chocolate syrup, gumdrops, marshmallows. Limit overall sugar intake. Fats and Oil Margarine, butter, cream, mayonnaise, salad oils, plain salad dressings made from allowed foods. Choose healthy fats such as olive oil, canola oil, and omega-3 fatty acids (such as found in salmon or tuna) when possible. Other Bouillon, broth, or cream soups made from allowed foods. Any strained soup. Casseroles or mixed dishes made with allowed foods. The items listed above may not be a complete list of recommended foods or beverages. Contact your dietitian for more options. What foods are not recommended? Grains All whole wheat and whole grain breads and crackers. Multigrains, rye, bran seeds, nuts, or coconut. Cereals containing whole grains, multigrains, bran, coconut, nuts, raisins. Cooked or dry oatmeal, steel-cut oats. Coarse wheat cereals, granola. Cereals advertised as high fiber. Potato skins. Whole grain pasta, wild or  brown rice. Popcorn. Coconut flour. Bran, buckwheat, corn bread, multigrains, rye, wheat germ. Vegetables Fresh, cooked or canned vegetables, such as artichokes, asparagus, beet greens, broccoli, Brussels sprouts, cabbage, celery, cauliflower, corn, eggplant, kale, legumes or beans, okra, peas, and tomatoes. Avoid large servings of any vegetables, especially raw vegetables. Fruits Fresh  fruits, such as apples with or without skin, berries, cherries, figs, grapes, grapefruit, guavas, kiwis, mangoes, oranges, papayas, pears, persimmons, pineapple, and pomegranate. Prune juice and juices with pulp, stewed or dried prunes. Dried fruits, dates, raisins. Fruit seeds or skins. Avoid large servings of all fresh fruits. Meats and Other Protein Sources Tough, fibrous meats with gristle. Chunky nut butter. Cheese made with seeds, nuts, or other foods not recommended. Nuts, seeds, legumes (beans, including baked beans), dried peas, beans, lentils. Dairy Yogurt or cheese that contains nuts, seeds, or added fruit. Beverages Fruit juices with high pulp, prune juice. Caffeinated coffee and teas. Condiments Coconut, maple syrup, pickles, olives. Sweets and Desserts Desserts, cookies, or candies that contain nuts or coconut, chunky peanut butter, dried fruits. Jams, preserves with seeds, marmalade. Large amounts of sugar and sweets. Any other dessert made with fruits from the not recommended list. Other Soups made from vegetables that are not recommended or that contain other foods not recommended. The items listed above may not be a complete list of foods and beverages to avoid. Contact your dietitian for more information. This information is not intended to replace advice given to you by your health care provider. Make sure you discuss any questions you have with your health care provider. Document Released: 10/23/2001 Document Revised: 10/09/2015 Document Reviewed: 03/26/2013 Elsevier Interactive Patient Education  2017 [employer].

## 2018-01-19 ENCOUNTER — Telehealth: Payer: Self-pay | Admitting: Internal Medicine

## 2018-01-19 DIAGNOSIS — M542 Cervicalgia: Secondary | ICD-10-CM | POA: Diagnosis not present

## 2018-01-19 DIAGNOSIS — M47817 Spondylosis without myelopathy or radiculopathy, lumbosacral region: Secondary | ICD-10-CM | POA: Diagnosis not present

## 2018-01-19 DIAGNOSIS — G894 Chronic pain syndrome: Secondary | ICD-10-CM | POA: Diagnosis not present

## 2018-01-19 MED ORDER — BUDESONIDE-FORMOTEROL FUMARATE 160-4.5 MCG/ACT IN AERO: 2.0000 | INHALATION_SPRAY | Freq: Two times a day (BID) | RESPIRATORY_TRACT | 5 refills | Status: DC

## 2018-01-19 NOTE — Telephone Encounter (Signed)
Rx has been sent electronically. Nothing more needed at this time.

## 2018-01-20 ENCOUNTER — Other Ambulatory Visit: Payer: Self-pay | Admitting: Internal Medicine

## 2018-01-20 MED ORDER — ALBUTEROL SULFATE HFA 108 (90 BASE) MCG/ACT IN AERS: 1.0000 | INHALATION_SPRAY | Freq: Four times a day (QID) | RESPIRATORY_TRACT | 5 refills | Status: AC | PRN

## 2018-01-20 NOTE — Telephone Encounter (Signed)
Received faxed refill request from [facility] for Proair 64mcg 1-2 puffs every 6 hours as needed Last office visit 7.10.19 with MW Ventolin HFA is currently on med list, but likely not covered by insurance at this time  Med list changed to Proair and refills sent Nothing further needed at this time; will sign off

## 2018-01-24 ENCOUNTER — Other Ambulatory Visit: Payer: Self-pay

## 2018-01-24 ENCOUNTER — Other Ambulatory Visit: Payer: Self-pay | Admitting: Physician Assistant

## 2018-01-24 DIAGNOSIS — E119 Type 2 diabetes mellitus without complications: Secondary | ICD-10-CM

## 2018-01-24 DIAGNOSIS — Z794 Long term (current) use of insulin: Principal | ICD-10-CM

## 2018-01-24 DIAGNOSIS — R609 Edema, unspecified: Secondary | ICD-10-CM

## 2018-01-24 MED ORDER — INSULIN GLARGINE 100 UNIT/ML ~~LOC~~ SOLN: SUBCUTANEOUS | 3 refills | Status: DC

## 2018-01-24 MED ORDER — TORSEMIDE 20 MG PO TABS: 20.0000 mg | ORAL_TABLET | Freq: Every day | ORAL | 0 refills | Status: DC

## 2018-01-24 NOTE — Telephone Encounter (Signed)
Copied from [place] [id number]. Topic: General - Other >> Jan 24, 2018 11:08 AM Janace Aris A wrote: Medication: torsemide & LANTUS 100 UNIT/ML injection   Has the patient contacted their pharmacy? Yes  Agent: If yes, when and what did the pharmacy advise? New pharmacy on file, they said they do not have the order.   Preferred Pharmacy (with phone number or street name): [place], [place], [place] - [address]  [phone] (Phone) [phone] (Fax)

## 2018-01-24 NOTE — Telephone Encounter (Signed)
Both prescriptions have been sent to [facility].

## 2018-01-24 NOTE — Telephone Encounter (Signed)
Send new prescriptions to  [place], [place] [address] [phone]  torsemide  refill Last Refill:10/18/17 # 30 Last OV: 11/29/17 PCP: [name] Pharmacy:[facility] Pt reported not taking with another provider (Cirigliano at 01/17/18 office visit)  Lantus 100 units  refill Last Refill:08/08/17 # 30 ml  With 3 refills Last OV: 11/29/17 PCP: [name] Pharmacy:[facility]

## 2018-01-30 DIAGNOSIS — G4733 Obstructive sleep apnea (adult) (pediatric): Secondary | ICD-10-CM | POA: Diagnosis not present

## 2018-01-31 DIAGNOSIS — J449 Chronic obstructive pulmonary disease, unspecified: Secondary | ICD-10-CM | POA: Diagnosis not present

## 2018-02-06 ENCOUNTER — Other Ambulatory Visit: Payer: Self-pay

## 2018-02-06 NOTE — Patient Outreach (Signed)
[facility]) Care Management  02/06/2018  WAYLON KOFFLER 09/09/1955 [id number]   Medication Adherence call to Mrs. [name] spoke with patient she  was taking off on Pravastatin 20 mg for a little while but now doctor put her back on it and is taking it,and on Losartan 25 mg she is still  taking this medication and will order it when finished. Mrs. Rozar is showing past due under [place].   [facility] Management Direct Dial [phone]  Fax [phone] [email or web address]

## 2018-02-07 DIAGNOSIS — G4733 Obstructive sleep apnea (adult) (pediatric): Secondary | ICD-10-CM | POA: Diagnosis not present

## 2018-02-10 ENCOUNTER — Ambulatory Visit: Payer: Medicare Other | Admitting: Family Medicine

## 2018-02-10 ENCOUNTER — Other Ambulatory Visit: Payer: Self-pay

## 2018-02-10 ENCOUNTER — Encounter: Payer: Self-pay | Admitting: Physician Assistant

## 2018-02-10 ENCOUNTER — Ambulatory Visit (INDEPENDENT_AMBULATORY_CARE_PROVIDER_SITE_OTHER): Payer: Medicare Other | Admitting: Physician Assistant

## 2018-02-10 VITALS — BP 130/64 | HR 89 | Temp 98.2°F | Resp 16 | Ht 64.0 in | Wt 277.0 lb

## 2018-02-10 DIAGNOSIS — L03115 Cellulitis of right lower limb: Secondary | ICD-10-CM

## 2018-02-10 MED ORDER — CEPHALEXIN 500 MG PO CAPS: 500.0000 mg | ORAL_CAPSULE | Freq: Three times a day (TID) | ORAL | 0 refills | Status: DC

## 2018-02-10 MED ORDER — CEPHALEXIN 500 MG PO CAPS: 500.0000 mg | ORAL_CAPSULE | Freq: Two times a day (BID) | ORAL | 0 refills | Status: AC

## 2018-02-10 NOTE — Progress Notes (Signed)
Patient presents to clinic today c/o 2 days of redness, tenderness and hardness of skin of lower leg after sustaining a cut from a cardboard box. Denies fever, chills, nausea or vomiting. Notes the cut is scabbing over but the redness is more pronounced today than yesterday. Wanted the area assessed before the weekend. .   Past Medical History:  Diagnosis Date  . Abdominal mass of other site   . Cervical compression fracture ([place])   . Chronic kidney disease, stage 3 (HCC)    Borderline Stage 2-3  . Chronic lower limb pain   . COPD (chronic obstructive pulmonary disease) ([place])   . CTS (carpal tunnel syndrome)   . Depression   . Diabetes ([place])    Type II  . Fibromyalgia   . Hepatitis C    cured last year (2018)  . Hypercholesteremia   . Hypertension   . Hypothyroidism   . Morbid obesity ([place])   . Neuropathy    Diabetes  . OSA (obstructive sleep apnea)   . Osteoarthritis   . Renal cancer ([place])    Left Kidney Removed  . RLS (restless legs syndrome)   . Syncope   . Venous stasis     Current Outpatient Medications on File Prior to Visit  Medication Sig Dispense Refill  . albuterol (PROAIR HFA) 108 (90 Base) MCG/ACT inhaler Inhale 1-2 puffs into the lungs every 6 (six) hours as needed for wheezing or shortness of breath. 1 Inhaler 5  . budesonide-formoterol (SYMBICORT) 160-4.5 MCG/ACT inhaler Inhale 2 puffs into the lungs 2 (two) times daily. 1 Inhaler 5  . clotrimazole-betamethasone (LOTRISONE) cream Apply 1 application topically 2 (two) times daily as needed. 45 g 0  . EMBEDA 30-1.2 MG CPCR Take 1 capsule by mouth 2 (two) times daily. Take 1 capsule by mouth twice daily    . HUMALOG 100 UNIT/ML injection INJECT 0.15 ML (15 UNITS) INTO THE SKIN 3 TIMES DAILY AS NEEDED FOR HIGH BLOOD SUGAR 10 mL 0  . insulin glargine (LANTUS) 100 UNIT/ML injection INJECT 0.3MLS (30 UNITS TOTAL) INTO THE SKIN EVERY DAY 30 mL 3  . ipratropium-albuterol (DUONEB) 0.5-2.5 (3) MG/3ML SOLN Take 3 mLs by  nebulization 2 (two) times daily. (Patient taking differently: Take 3 mLs by nebulization 2 (two) times daily as needed. ) 360 mL 3  . levothyroxine (SYNTHROID, LEVOTHROID) 88 MCG tablet Take 1 tablet (88 mcg total) by mouth daily before breakfast. 90 tablet 1  . losartan (COZAAR) 25 MG tablet Take 1 tablet by mouth daily.    . metoCLOPramide (REGLAN) 10 MG tablet Take 1 tablet (10 mg total) by mouth every 6 (six) hours as needed for nausea. 120 tablet 1  . Multiple Vitamins-Minerals (CENTRUM SILVER PO) Take by mouth.    . nortriptyline (PAMELOR) 25 MG capsule TAKE 1 CAPSULE BY MOUTH AT BEDTIME 30 capsule 5  . nortriptyline (PAMELOR) 75 MG capsule TAKE 1 CAPSULE BY MOUTH AT BEDTIME 30 capsule 5  . nystatin (MYCOSTATIN/NYSTOP) powder APPLY TOPICALLY TWICE DAILY AS NEEDED FOR YEAST INFECTION 15 g 2  . oxyCODONE (OXY IR/ROXICODONE) 5 MG immediate release tablet Take 5 mg by mouth 2 (two) times daily as needed for breakthrough pain. Take 1 tablet by mouth twice daily as needed    . OXYGEN O2 2lpm with sleep and 2.5 lpm with exertion  [employer]    . pravastatin (PRAVACHOL) 20 MG tablet TAKE ONE TABLET BY MOUTH EVERY DAY 90 tablet 1  . Probiotic Product (PROBIOTIC PO) Take  by mouth.    Haig Prophet COMFORT INSULIN SYRINGE 30G X 1/2" 1 ML MISC USE FOUR (4) TIMES DAILY AS DIRECTED 100 each 3  . tiZANidine (ZANAFLEX) 2 MG tablet Take 2-4 mg by mouth every 12 (twelve) hours as needed for muscle spasms.    [name] Kitchen torsemide (DEMADEX) 20 MG tablet Take 1 tablet (20 mg total) by mouth daily. 30 tablet 0   No current facility-administered medications on file prior to visit.     Allergies  Allergen Reactions  . Gabapentin Anaphylaxis  . Lyrica [Pregabalin] Shortness Of Breath    Trouble breathing  . Ketorolac Tromethamine Hives  . Lisinopril Cough    Family History  Problem Relation Age of Onset  . Heart attack Mother 52       Deceased  . Heart disease Mother   . Emphysema Mother   . Alcoholism Mother   .  COPD Father 25       Deceased  . Emphysema Father   . Alcoholism Father   . Esophageal varices Father   . Alcoholism Paternal Grandfather   . Diabetes Maternal Grandmother   . Heart disease Maternal Grandmother   . Lung cancer Maternal Grandfather   . Emphysema Maternal Grandfather   . Brain cancer Maternal Aunt   . Diabetes Sister   . Heart defect Sister   . Cancer Sister        was told her sister had cancer but beat it and dont know which one   . Heart defect Sister   . Obesity Son   . Breast cancer Maternal Aunt   . Colon cancer Neg Hx   . Esophageal cancer Neg Hx    Social History   Socioeconomic History  . Marital status: Widowed    Spouse name: Not on file  . Number of children: 1  . Years of education: Not on file  . Highest education level: Not on file  Occupational History  . Occupation: Retired  Scientific laboratory technician  . Financial resource strain: Not on file  . Food insecurity:    Worry: Not on file    Inability: Not on file  . Transportation needs:    Medical: Not on file    Non-medical: Not on file  Tobacco Use  . Smoking status: Former Smoker    Packs/day: 2.50    Years: 45.00    Pack years: 112.50    Types: Cigarettes    Last attempt to quit: 07/21/2016    Years since quitting: 1.5  . Smokeless tobacco: Never Used  Substance and Sexual Activity  . Alcohol use: No  . Drug use: No  . Sexual activity: Never  Lifestyle  . Physical activity:    Days per week: Not on file    Minutes per session: Not on file  . Stress: Not on file  Relationships  . Social connections:    Talks on phone: Not on file    Gets together: Not on file    Attends religious service: Not on file    Active member of club or organization: Not on file    Attends meetings of clubs or organizations: Not on file    Relationship status: Not on file  Other Topics Concern  . Not on file  Social History Narrative  . Not on file   Review of Systems - See HPI.  All other ROS are  negative.  BP 130/64   Pulse 89   Temp 98.2 F (36.8 C) (Oral)  Resp 16   Ht _0  (1.626 m)   Wt 277 lb (125.6 kg)   SpO2 94% Comment: 2.5L  BMI 47.55 kg/m   Physical Exam  Constitutional: She appears well-developed and well-nourished.  HENT:  Head: Normocephalic and atraumatic.  Cardiovascular: Normal rate, regular rhythm, normal heart sounds and intact distal pulses.  Pulmonary/Chest: Effort normal and breath sounds normal. No stridor. No respiratory distress. She has no wheezes. She has no rales. She exhibits no tenderness.  Skin:     Psychiatric: She has a normal mood and affect.  Vitals reviewed.   Recent Results (from the past 2160 hour(s))  CBC w/Diff     Status: Abnormal   Collection Time: 11/29/17 10:54 AM  Result Value Ref Range   WBC 5.0 4.0 - 10.5 K/uL   RBC 4.76 3.87 - 5.11 Mil/uL   Hemoglobin 12.5 12.0 - 15.0 g/dL   HCT 39.0 36.0 - 46.0 %   MCV 82.0 78.0 - 100.0 fl   MCHC 32.0 30.0 - 36.0 g/dL   RDW 18.5 (H) 11.5 - 15.5 %   Platelets 85.0 (L) 150.0 - 400.0 K/uL   Neutrophils Relative % 76.2 43.0 - 77.0 %   Lymphocytes Relative 15.1 12.0 - 46.0 %   Monocytes Relative 7.0 3.0 - 12.0 %   Eosinophils Relative 1.2 0.0 - 5.0 %   Basophils Relative 0.5 0.0 - 3.0 %   Neutro Abs 3.8 1.4 - 7.7 K/uL   Lymphs Abs 0.8 0.7 - 4.0 K/uL   Monocytes Absolute 0.3 0.1 - 1.0 K/uL   Eosinophils Absolute 0.1 0.0 - 0.7 K/uL   Basophils Absolute 0.0 0.0 - 0.1 K/uL  Comp Met (CMET)     Status: Abnormal   Collection Time: 11/29/17 10:54 AM  Result Value Ref Range   Sodium 138 135 - 145 mEq/L   Potassium 3.9 3.5 - 5.1 mEq/L   Chloride 95 (L) 96 - 112 mEq/L   CO2 37 (H) 19 - 32 mEq/L   Glucose, Bld 115 (H) 70 - 99 mg/dL   BUN 24 (H) 6 - 23 mg/dL   Creatinine, Ser 1.73 (H) 0.40 - 1.20 mg/dL   Total Bilirubin 0.4 0.2 - 1.2 mg/dL   Alkaline Phosphatase 69 39 - 117 U/L   AST 12 0 - 37 U/L   ALT 7 0 - 35 U/L   Total Protein 6.6 6.0 - 8.3 g/dL   Albumin 3.9 3.5 - 5.2 g/dL    Calcium 9.0 8.4 - 10.5 mg/dL   GFR 31.48 (L) >60.00 mL/min  Lipase     Status: Abnormal   Collection Time: 11/29/17 10:54 AM  Result Value Ref Range   Lipase 5.0 (L) 11.0 - 59.0 U/L  H. pylori antibody, IgG     Status: None   Collection Time: 11/29/17 10:54 AM  Result Value Ref Range   H Pylori IgG Negative Negative  Basic metabolic panel     Status: Abnormal   Collection Time: 12/02/17  3:07 PM  Result Value Ref Range   Sodium 138 135 - 145 mEq/L   Potassium 3.8 3.5 - 5.1 mEq/L   Chloride 95 (L) 96 - 112 mEq/L   CO2 36 (H) 19 - 32 mEq/L   Glucose, Bld 91 70 - 99 mg/dL   BUN 26 (H) 6 - 23 mg/dL   Creatinine, Ser 1.82 (H) 0.40 - 1.20 mg/dL   Calcium 9.3 8.4 - 10.5 mg/dL   GFR 29.69 (L) >60.00 mL/min  Comprehensive metabolic  panel     Status: Abnormal   Collection Time: 12/09/17 12:59 PM  Result Value Ref Range   Sodium 140 135 - 145 mEq/L   Potassium 4.9 3.5 - 5.1 mEq/L   Chloride 102 96 - 112 mEq/L   CO2 34 (H) 19 - 32 mEq/L   Glucose, Bld 196 (H) 70 - 99 mg/dL   BUN 20 6 - 23 mg/dL   Creatinine, Ser 1.52 (H) 0.40 - 1.20 mg/dL   Total Bilirubin 0.4 0.2 - 1.2 mg/dL   Alkaline Phosphatase 63 39 - 117 U/L   AST 10 0 - 37 U/L   ALT 6 0 - 35 U/L   Total Protein 6.3 6.0 - 8.3 g/dL   Albumin 3.6 3.5 - 5.2 g/dL   Calcium 8.8 8.4 - 10.5 mg/dL   GFR 36.55 (L) >60.00 mL/min   Assessment/Plan: 1. Cellulitis of right lower extremity Will start Keflex 500 mg BID x 7 days. Ice and elevate leg. Increase Torsemide to 2 tablets daily x 1 day to help with swelling. Follow-up if not greatly improving over the weekend.     [name], PA-C

## 2018-02-10 NOTE — Patient Instructions (Signed)
Please keep leg elevated. Ice to help with warmth and tenderness. Take the antibiotic as directed. Increase the Torsemide to 2 tablets today, before resuming regular regimen. Limit salt intake. Follow-up Monday if not greatly improved.  ER for any worsening symptoms.

## 2018-02-13 ENCOUNTER — Ambulatory Visit: Payer: Medicare Other | Admitting: Internal Medicine

## 2018-02-14 ENCOUNTER — Other Ambulatory Visit (HOSPITAL_BASED_OUTPATIENT_CLINIC_OR_DEPARTMENT_OTHER): Payer: Self-pay | Admitting: Physician Assistant

## 2018-02-14 ENCOUNTER — Ambulatory Visit (HOSPITAL_BASED_OUTPATIENT_CLINIC_OR_DEPARTMENT_OTHER)
Admission: RE | Admit: 2018-02-14 | Discharge: 2018-02-14 | Disposition: A | Discharge status: Home / Self Care | Payer: Medicare Other | Source: Ambulatory Visit | Attending: Physician Assistant | Admitting: Physician Assistant

## 2018-02-14 DIAGNOSIS — M542 Cervicalgia: Secondary | ICD-10-CM | POA: Diagnosis not present

## 2018-02-14 DIAGNOSIS — M47812 Spondylosis without myelopathy or radiculopathy, cervical region: Secondary | ICD-10-CM | POA: Diagnosis not present

## 2018-02-16 DIAGNOSIS — G894 Chronic pain syndrome: Secondary | ICD-10-CM | POA: Diagnosis not present

## 2018-02-16 DIAGNOSIS — Z79891 Long term (current) use of opiate analgesic: Secondary | ICD-10-CM | POA: Diagnosis not present

## 2018-02-16 DIAGNOSIS — M47817 Spondylosis without myelopathy or radiculopathy, lumbosacral region: Secondary | ICD-10-CM | POA: Diagnosis not present

## 2018-02-16 DIAGNOSIS — Z79899 Other long term (current) drug therapy: Secondary | ICD-10-CM | POA: Diagnosis not present

## 2018-02-16 DIAGNOSIS — M542 Cervicalgia: Secondary | ICD-10-CM | POA: Diagnosis not present

## 2018-02-16 DIAGNOSIS — M5136 Other intervertebral disc degeneration, lumbar region: Secondary | ICD-10-CM | POA: Diagnosis not present

## 2018-02-21 ENCOUNTER — Other Ambulatory Visit: Payer: Self-pay | Admitting: Physician Assistant

## 2018-02-21 DIAGNOSIS — R609 Edema, unspecified: Secondary | ICD-10-CM

## 2018-02-24 ENCOUNTER — Encounter (HOSPITAL_COMMUNITY): Payer: Self-pay | Admitting: *Deleted

## 2018-02-24 ENCOUNTER — Emergency Department (HOSPITAL_COMMUNITY): Payer: Medicare Other

## 2018-02-24 ENCOUNTER — Other Ambulatory Visit: Payer: Self-pay

## 2018-02-24 ENCOUNTER — Inpatient Hospital Stay (HOSPITAL_COMMUNITY)
Admission: EM | Admit: 2018-02-24 | Discharge: 2018-03-02 | DRG: 190 | Disposition: A | Discharge status: Home / Self Care | Payer: Medicare Other | Attending: Family Medicine | Admitting: Family Medicine

## 2018-02-24 DIAGNOSIS — I2781 Cor pulmonale (chronic): Secondary | ICD-10-CM | POA: Diagnosis present

## 2018-02-24 DIAGNOSIS — N179 Acute kidney failure, unspecified: Secondary | ICD-10-CM | POA: Diagnosis not present

## 2018-02-24 DIAGNOSIS — I878 Other specified disorders of veins: Secondary | ICD-10-CM | POA: Diagnosis present

## 2018-02-24 DIAGNOSIS — E7800 Pure hypercholesterolemia, unspecified: Secondary | ICD-10-CM | POA: Diagnosis not present

## 2018-02-24 DIAGNOSIS — Z79890 Hormone replacement therapy (postmenopausal): Secondary | ICD-10-CM

## 2018-02-24 DIAGNOSIS — J90 Pleural effusion, not elsewhere classified: Secondary | ICD-10-CM

## 2018-02-24 DIAGNOSIS — I5032 Chronic diastolic (congestive) heart failure: Secondary | ICD-10-CM | POA: Diagnosis not present

## 2018-02-24 DIAGNOSIS — J189 Pneumonia, unspecified organism: Secondary | ICD-10-CM

## 2018-02-24 DIAGNOSIS — X088XXA Exposure to other specified smoke, fire and flames, initial encounter: Secondary | ICD-10-CM | POA: Diagnosis present

## 2018-02-24 DIAGNOSIS — I130 Hypertensive heart and chronic kidney disease with heart failure and stage 1 through stage 4 chronic kidney disease, or unspecified chronic kidney disease: Secondary | ICD-10-CM | POA: Diagnosis present

## 2018-02-24 DIAGNOSIS — G5600 Carpal tunnel syndrome, unspecified upper limb: Secondary | ICD-10-CM | POA: Diagnosis present

## 2018-02-24 DIAGNOSIS — G894 Chronic pain syndrome: Secondary | ICD-10-CM | POA: Diagnosis not present

## 2018-02-24 DIAGNOSIS — I10 Essential (primary) hypertension: Secondary | ICD-10-CM

## 2018-02-24 DIAGNOSIS — J9811 Atelectasis: Secondary | ICD-10-CM

## 2018-02-24 DIAGNOSIS — Z803 Family history of malignant neoplasm of breast: Secondary | ICD-10-CM

## 2018-02-24 DIAGNOSIS — Z905 Acquired absence of kidney: Secondary | ICD-10-CM

## 2018-02-24 DIAGNOSIS — D631 Anemia in chronic kidney disease: Secondary | ICD-10-CM | POA: Diagnosis not present

## 2018-02-24 DIAGNOSIS — E1143 Type 2 diabetes mellitus with diabetic autonomic (poly)neuropathy: Secondary | ICD-10-CM | POA: Diagnosis not present

## 2018-02-24 DIAGNOSIS — E039 Hypothyroidism, unspecified: Secondary | ICD-10-CM | POA: Diagnosis not present

## 2018-02-24 DIAGNOSIS — Z8249 Family history of ischemic heart disease and other diseases of the circulatory system: Secondary | ICD-10-CM

## 2018-02-24 DIAGNOSIS — Z79899 Other long term (current) drug therapy: Secondary | ICD-10-CM

## 2018-02-24 DIAGNOSIS — D696 Thrombocytopenia, unspecified: Secondary | ICD-10-CM | POA: Diagnosis present

## 2018-02-24 DIAGNOSIS — G2581 Restless legs syndrome: Secondary | ICD-10-CM | POA: Diagnosis present

## 2018-02-24 DIAGNOSIS — E1122 Type 2 diabetes mellitus with diabetic chronic kidney disease: Secondary | ICD-10-CM | POA: Diagnosis present

## 2018-02-24 DIAGNOSIS — G4733 Obstructive sleep apnea (adult) (pediatric): Secondary | ICD-10-CM | POA: Diagnosis present

## 2018-02-24 DIAGNOSIS — Z7951 Long term (current) use of inhaled steroids: Secondary | ICD-10-CM

## 2018-02-24 DIAGNOSIS — Z801 Family history of malignant neoplasm of trachea, bronchus and lung: Secondary | ICD-10-CM

## 2018-02-24 DIAGNOSIS — Z87891 Personal history of nicotine dependence: Secondary | ICD-10-CM

## 2018-02-24 DIAGNOSIS — R0602 Shortness of breath: Secondary | ICD-10-CM

## 2018-02-24 DIAGNOSIS — F319 Bipolar disorder, unspecified: Secondary | ICD-10-CM | POA: Diagnosis present

## 2018-02-24 DIAGNOSIS — D649 Anemia, unspecified: Secondary | ICD-10-CM | POA: Diagnosis present

## 2018-02-24 DIAGNOSIS — Z833 Family history of diabetes mellitus: Secondary | ICD-10-CM

## 2018-02-24 DIAGNOSIS — N183 Chronic kidney disease, stage 3 unspecified: Secondary | ICD-10-CM | POA: Diagnosis present

## 2018-02-24 DIAGNOSIS — M1990 Unspecified osteoarthritis, unspecified site: Secondary | ICD-10-CM | POA: Diagnosis present

## 2018-02-24 DIAGNOSIS — J441 Chronic obstructive pulmonary disease with (acute) exacerbation: Principal | ICD-10-CM | POA: Diagnosis present

## 2018-02-24 DIAGNOSIS — Z85528 Personal history of other malignant neoplasm of kidney: Secondary | ICD-10-CM

## 2018-02-24 DIAGNOSIS — T59811A Toxic effect of smoke, accidental (unintentional), initial encounter: Secondary | ICD-10-CM | POA: Diagnosis present

## 2018-02-24 DIAGNOSIS — Z794 Long term (current) use of insulin: Secondary | ICD-10-CM

## 2018-02-24 DIAGNOSIS — Z90710 Acquired absence of both cervix and uterus: Secondary | ICD-10-CM

## 2018-02-24 DIAGNOSIS — E119 Type 2 diabetes mellitus without complications: Secondary | ICD-10-CM

## 2018-02-24 DIAGNOSIS — J9621 Acute and chronic respiratory failure with hypoxia: Secondary | ICD-10-CM | POA: Diagnosis present

## 2018-02-24 DIAGNOSIS — Z9049 Acquired absence of other specified parts of digestive tract: Secondary | ICD-10-CM

## 2018-02-24 DIAGNOSIS — M797 Fibromyalgia: Secondary | ICD-10-CM | POA: Diagnosis present

## 2018-02-24 DIAGNOSIS — J440 Chronic obstructive pulmonary disease with acute lower respiratory infection: Secondary | ICD-10-CM | POA: Diagnosis not present

## 2018-02-24 DIAGNOSIS — J9622 Acute and chronic respiratory failure with hypercapnia: Secondary | ICD-10-CM | POA: Diagnosis not present

## 2018-02-24 DIAGNOSIS — Z79891 Long term (current) use of opiate analgesic: Secondary | ICD-10-CM

## 2018-02-24 DIAGNOSIS — Z825 Family history of asthma and other chronic lower respiratory diseases: Secondary | ICD-10-CM

## 2018-02-24 DIAGNOSIS — I2729 Other secondary pulmonary hypertension: Secondary | ICD-10-CM | POA: Diagnosis present

## 2018-02-24 DIAGNOSIS — F40240 Claustrophobia: Secondary | ICD-10-CM | POA: Diagnosis present

## 2018-02-24 DIAGNOSIS — Z9981 Dependence on supplemental oxygen: Secondary | ICD-10-CM

## 2018-02-24 DIAGNOSIS — J181 Lobar pneumonia, unspecified organism: Secondary | ICD-10-CM | POA: Diagnosis present

## 2018-02-24 DIAGNOSIS — Z8619 Personal history of other infectious and parasitic diseases: Secondary | ICD-10-CM

## 2018-02-24 DIAGNOSIS — Z811 Family history of alcohol abuse and dependence: Secondary | ICD-10-CM

## 2018-02-24 DIAGNOSIS — E1129 Type 2 diabetes mellitus with other diabetic kidney complication: Secondary | ICD-10-CM

## 2018-02-24 DIAGNOSIS — Z808 Family history of malignant neoplasm of other organs or systems: Secondary | ICD-10-CM

## 2018-02-24 DIAGNOSIS — Z6841 Body Mass Index (BMI) 40.0 and over, adult: Secondary | ICD-10-CM

## 2018-02-24 DIAGNOSIS — Z888 Allergy status to other drugs, medicaments and biological substances status: Secondary | ICD-10-CM

## 2018-02-24 DIAGNOSIS — J449 Chronic obstructive pulmonary disease, unspecified: Secondary | ICD-10-CM | POA: Diagnosis present

## 2018-02-24 LAB — CBC WITH DIFFERENTIAL/PLATELET
Abs Immature Granulocytes: 0.06 10*3/uL (ref 0.00–0.07)
BASOS ABS: 0 10*3/uL (ref 0.0–0.1)
Basophils Relative: 0 %
EOS ABS: 0.1 10*3/uL (ref 0.0–0.5)
Eosinophils Relative: 1 %
HCT: 34.8 % — ABNORMAL LOW (ref 36.0–46.0)
Hemoglobin: 9.6 g/dL — ABNORMAL LOW (ref 12.0–15.0)
IMMATURE GRANULOCYTES: 1 %
Lymphocytes Relative: 12 %
Lymphs Abs: 0.7 10*3/uL (ref 0.7–4.0)
MCH: 25.1 pg — ABNORMAL LOW (ref 26.0–34.0)
MCHC: 27.6 g/dL — ABNORMAL LOW (ref 30.0–36.0)
MCV: 91.1 fL (ref 80.0–100.0)
Monocytes Absolute: 0.5 10*3/uL (ref 0.1–1.0)
Monocytes Relative: 8 %
NEUTROS PCT: 78 %
NRBC: 0 % (ref 0.0–0.2)
Neutro Abs: 4.8 10*3/uL (ref 1.7–7.7)
PLATELETS: 85 10*3/uL — AB (ref 150–400)
RBC: 3.82 MIL/uL — AB (ref 3.87–5.11)
RDW: 17.1 % — AB (ref 11.5–15.5)
WBC: 6.1 10*3/uL (ref 4.0–10.5)

## 2018-02-24 LAB — BASIC METABOLIC PANEL
ANION GAP: 11 (ref 5–15)
BUN: 25 mg/dL — ABNORMAL HIGH (ref 8–23)
CALCIUM: 9.2 mg/dL (ref 8.9–10.3)
CO2: 37 mmol/L — ABNORMAL HIGH (ref 22–32)
Chloride: 98 mmol/L (ref 98–111)
Creatinine, Ser: 1.48 mg/dL — ABNORMAL HIGH (ref 0.44–1.00)
GFR calc Af Amer: 42 mL/min — ABNORMAL LOW (ref >60)
GFR, EST NON AFRICAN AMERICAN: 36 mL/min — AB (ref >60)
Glucose, Bld: 62 mg/dL — ABNORMAL LOW (ref 70–99)
POTASSIUM: 4 mmol/L (ref 3.5–5.1)
Sodium: 146 mmol/L — ABNORMAL HIGH (ref 135–145)

## 2018-02-24 LAB — RETICULOCYTES
IMMATURE RETIC FRACT: 21.9 % — AB (ref 2.3–15.9)
RBC.: 4.17 MIL/uL (ref 3.87–5.11)
RETIC COUNT ABSOLUTE: 69.6 10*3/uL (ref 19.0–186.0)
Retic Ct Pct: 1.7 % (ref 0.4–3.1)

## 2018-02-24 LAB — FOLATE: FOLATE: 10.4 ng/mL (ref >5.9)

## 2018-02-24 MED ORDER — ACETAMINOPHEN 650 MG RE SUPP: 650.0000 mg | Freq: Four times a day (QID) | RECTAL | Status: DC | PRN

## 2018-02-24 MED ORDER — TIZANIDINE HCL 4 MG PO TABS: 2.0000 mg | ORAL_TABLET | Freq: Two times a day (BID) | ORAL | Status: DC | PRN

## 2018-02-24 MED ORDER — METHYLPREDNISOLONE SODIUM SUCC 125 MG IJ SOLR
60.0000 mg | Freq: Four times a day (QID) | INTRAMUSCULAR | Status: DC
  Administered 2018-02-25 – 2018-02-26 (×6): 60 mg via INTRAVENOUS
  Filled 2018-02-24 (×6): qty 2

## 2018-02-24 MED ORDER — METHYLPREDNISOLONE SODIUM SUCC 125 MG IJ SOLR
125.0000 mg | Freq: Once | INTRAMUSCULAR | Status: AC
  Administered 2018-02-24: 125 mg via INTRAVENOUS
  Filled 2018-02-24: qty 2

## 2018-02-24 MED ORDER — LOSARTAN POTASSIUM 50 MG PO TABS
25.0000 mg | ORAL_TABLET | Freq: Every day | ORAL | Status: DC
  Administered 2018-02-25 (×2): 25 mg via ORAL
  Filled 2018-02-24 (×2): qty 1

## 2018-02-24 MED ORDER — INSULIN ASPART 100 UNIT/ML ~~LOC~~ SOLN
0.0000 [IU] | Freq: Three times a day (TID) | SUBCUTANEOUS | Status: DC
  Administered 2018-02-25: 2 [IU] via SUBCUTANEOUS
  Administered 2018-02-25: 3 [IU] via SUBCUTANEOUS
  Administered 2018-02-25: 5 [IU] via SUBCUTANEOUS
  Administered 2018-02-26: 3 [IU] via SUBCUTANEOUS
  Administered 2018-02-26 – 2018-02-27 (×3): 2 [IU] via SUBCUTANEOUS
  Administered 2018-02-27 – 2018-02-28 (×2): 3 [IU] via SUBCUTANEOUS
  Administered 2018-02-28 – 2018-03-01 (×3): 1 [IU] via SUBCUTANEOUS
  Administered 2018-03-01 – 2018-03-02 (×2): 2 [IU] via SUBCUTANEOUS
  Administered 2018-03-02: 3 [IU] via SUBCUTANEOUS

## 2018-02-24 MED ORDER — SODIUM CHLORIDE 0.9% FLUSH: 3.0000 mL | INTRAVENOUS | Status: DC | PRN

## 2018-02-24 MED ORDER — ACETAMINOPHEN 325 MG PO TABS: 650.0000 mg | ORAL_TABLET | Freq: Four times a day (QID) | ORAL | Status: DC | PRN

## 2018-02-24 MED ORDER — SENNOSIDES-DOCUSATE SODIUM 8.6-50 MG PO TABS: 1.0000 | ORAL_TABLET | Freq: Every evening | ORAL | Status: DC | PRN

## 2018-02-24 MED ORDER — ALBUTEROL SULFATE (2.5 MG/3ML) 0.083% IN NEBU: 2.5000 mg | INHALATION_SOLUTION | RESPIRATORY_TRACT | Status: DC | PRN

## 2018-02-24 MED ORDER — OXYCODONE HCL 5 MG PO TABS
5.0000 mg | ORAL_TABLET | Freq: Two times a day (BID) | ORAL | Status: DC | PRN
  Administered 2018-02-24 – 2018-03-01 (×6): 5 mg via ORAL
  Filled 2018-02-24 (×6): qty 1

## 2018-02-24 MED ORDER — MOMETASONE FURO-FORMOTEROL FUM 200-5 MCG/ACT IN AERO
2.0000 | INHALATION_SPRAY | Freq: Two times a day (BID) | RESPIRATORY_TRACT | Status: DC
  Administered 2018-02-25 – 2018-03-02 (×12): 2 via RESPIRATORY_TRACT
  Filled 2018-02-24: qty 8.8

## 2018-02-24 MED ORDER — MORPHINE SULFATE ER 30 MG PO TBCR
30.0000 mg | EXTENDED_RELEASE_TABLET | Freq: Two times a day (BID) | ORAL | Status: DC
  Administered 2018-02-25 – 2018-03-02 (×12): 30 mg via ORAL
  Filled 2018-02-24 (×12): qty 1

## 2018-02-24 MED ORDER — BISACODYL 5 MG PO TBEC: 5.0000 mg | DELAYED_RELEASE_TABLET | Freq: Every day | ORAL | Status: DC | PRN

## 2018-02-24 MED ORDER — IPRATROPIUM-ALBUTEROL 0.5-2.5 (3) MG/3ML IN SOLN
3.0000 mL | Freq: Once | RESPIRATORY_TRACT | Status: AC
  Administered 2018-02-24: 3 mL via RESPIRATORY_TRACT
  Filled 2018-02-24: qty 3

## 2018-02-24 MED ORDER — IPRATROPIUM-ALBUTEROL 0.5-2.5 (3) MG/3ML IN SOLN
3.0000 mL | Freq: Two times a day (BID) | RESPIRATORY_TRACT | Status: DC
  Administered 2018-02-25: 3 mL via RESPIRATORY_TRACT
  Filled 2018-02-24: qty 3

## 2018-02-24 MED ORDER — SODIUM CHLORIDE 0.9 % IV SOLN
500.0000 mg | Freq: Every day | INTRAVENOUS | Status: DC
  Administered 2018-02-25: 500 mg via INTRAVENOUS
  Filled 2018-02-24: qty 500

## 2018-02-24 MED ORDER — INSULIN GLARGINE 100 UNIT/ML ~~LOC~~ SOLN
15.0000 [IU] | Freq: Every day | SUBCUTANEOUS | Status: DC
  Administered 2018-02-25 – 2018-03-02 (×6): 15 [IU] via SUBCUTANEOUS
  Filled 2018-02-24 (×6): qty 0.15

## 2018-02-24 MED ORDER — LEVOTHYROXINE SODIUM 88 MCG PO TABS
88.0000 ug | ORAL_TABLET | Freq: Every day | ORAL | Status: DC
  Administered 2018-02-25 – 2018-03-02 (×6): 88 ug via ORAL
  Filled 2018-02-24 (×6): qty 1

## 2018-02-24 MED ORDER — PRAVASTATIN SODIUM 20 MG PO TABS
20.0000 mg | ORAL_TABLET | Freq: Every day | ORAL | Status: DC
  Administered 2018-02-25 – 2018-03-01 (×5): 20 mg via ORAL
  Filled 2018-02-24 (×6): qty 1

## 2018-02-24 MED ORDER — SODIUM CHLORIDE 0.9% FLUSH
3.0000 mL | Freq: Two times a day (BID) | INTRAVENOUS | Status: DC
  Administered 2018-02-25 – 2018-03-02 (×9): 3 mL via INTRAVENOUS

## 2018-02-24 MED ORDER — SODIUM CHLORIDE 0.9 % IV SOLN
250.0000 mL | INTRAVENOUS | Status: DC | PRN
  Administered 2018-02-25: 250 mL via INTRAVENOUS

## 2018-02-24 MED ORDER — INSULIN ASPART 100 UNIT/ML ~~LOC~~ SOLN
0.0000 [IU] | Freq: Every day | SUBCUTANEOUS | Status: DC
  Administered 2018-02-25 – 2018-02-28 (×3): 2 [IU] via SUBCUTANEOUS

## 2018-02-24 MED ORDER — METOCLOPRAMIDE HCL 10 MG PO TABS: 10.0000 mg | ORAL_TABLET | Freq: Four times a day (QID) | ORAL | Status: DC | PRN

## 2018-02-24 MED ORDER — NORTRIPTYLINE HCL 25 MG PO CAPS
75.0000 mg | ORAL_CAPSULE | Freq: Every day | ORAL | Status: DC
  Administered 2018-02-25: 75 mg via ORAL
  Filled 2018-02-24: qty 3
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
## 2018-02-24 NOTE — ED Notes (Signed)
AMBULATING PATIENT O2 STATS FELL TO 82% UPON RETURNING TO HER ROOM STATS WERE 74% PATIENT PLACED BACK ON OXYGEN THERAPY STATS RETURNED TO 97%
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
## 2018-02-24 NOTE — ED Notes (Signed)
Bed: EF00 Expected date: 02/24/18 Expected time: 6:56 PM Means of arrival:  Comments: RES B

## 2018-02-24 NOTE — ED Notes (Signed)
Patient BGL 62 on labwork, [place] sandwich given.
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
## 2018-02-24 NOTE — ED Notes (Signed)
Patient ambulated on 3LNC, oxygen saturations 85% while ambulating. Patient reports SOB while ambulating.

## 2018-02-25 DIAGNOSIS — N179 Acute kidney failure, unspecified: Secondary | ICD-10-CM | POA: Diagnosis not present

## 2018-02-25 DIAGNOSIS — F40240 Claustrophobia: Secondary | ICD-10-CM | POA: Diagnosis present

## 2018-02-25 DIAGNOSIS — D631 Anemia in chronic kidney disease: Secondary | ICD-10-CM | POA: Diagnosis present

## 2018-02-25 DIAGNOSIS — J9622 Acute and chronic respiratory failure with hypercapnia: Secondary | ICD-10-CM | POA: Diagnosis present

## 2018-02-25 DIAGNOSIS — Z6841 Body Mass Index (BMI) 40.0 and over, adult: Secondary | ICD-10-CM | POA: Diagnosis not present

## 2018-02-25 DIAGNOSIS — J449 Chronic obstructive pulmonary disease, unspecified: Secondary | ICD-10-CM | POA: Diagnosis present

## 2018-02-25 DIAGNOSIS — J441 Chronic obstructive pulmonary disease with (acute) exacerbation: Secondary | ICD-10-CM | POA: Diagnosis not present

## 2018-02-25 DIAGNOSIS — J181 Lobar pneumonia, unspecified organism: Secondary | ICD-10-CM | POA: Diagnosis present

## 2018-02-25 DIAGNOSIS — G5600 Carpal tunnel syndrome, unspecified upper limb: Secondary | ICD-10-CM | POA: Diagnosis present

## 2018-02-25 DIAGNOSIS — G894 Chronic pain syndrome: Secondary | ICD-10-CM | POA: Diagnosis not present

## 2018-02-25 DIAGNOSIS — N183 Chronic kidney disease, stage 3 (moderate): Secondary | ICD-10-CM | POA: Diagnosis not present

## 2018-02-25 DIAGNOSIS — X088XXA Exposure to other specified smoke, fire and flames, initial encounter: Secondary | ICD-10-CM | POA: Diagnosis present

## 2018-02-25 DIAGNOSIS — J9621 Acute and chronic respiratory failure with hypoxia: Secondary | ICD-10-CM | POA: Diagnosis not present

## 2018-02-25 DIAGNOSIS — R0602 Shortness of breath: Secondary | ICD-10-CM | POA: Diagnosis present

## 2018-02-25 DIAGNOSIS — E1122 Type 2 diabetes mellitus with diabetic chronic kidney disease: Secondary | ICD-10-CM | POA: Diagnosis not present

## 2018-02-25 DIAGNOSIS — G4733 Obstructive sleep apnea (adult) (pediatric): Secondary | ICD-10-CM | POA: Diagnosis present

## 2018-02-25 DIAGNOSIS — M1990 Unspecified osteoarthritis, unspecified site: Secondary | ICD-10-CM | POA: Diagnosis present

## 2018-02-25 DIAGNOSIS — E1143 Type 2 diabetes mellitus with diabetic autonomic (poly)neuropathy: Secondary | ICD-10-CM | POA: Diagnosis present

## 2018-02-25 DIAGNOSIS — J440 Chronic obstructive pulmonary disease with acute lower respiratory infection: Secondary | ICD-10-CM | POA: Diagnosis present

## 2018-02-25 DIAGNOSIS — I878 Other specified disorders of veins: Secondary | ICD-10-CM | POA: Diagnosis present

## 2018-02-25 DIAGNOSIS — I5032 Chronic diastolic (congestive) heart failure: Secondary | ICD-10-CM | POA: Diagnosis present

## 2018-02-25 DIAGNOSIS — I2781 Cor pulmonale (chronic): Secondary | ICD-10-CM | POA: Diagnosis present

## 2018-02-25 DIAGNOSIS — J90 Pleural effusion, not elsewhere classified: Secondary | ICD-10-CM | POA: Diagnosis not present

## 2018-02-25 DIAGNOSIS — E7800 Pure hypercholesterolemia, unspecified: Secondary | ICD-10-CM | POA: Diagnosis present

## 2018-02-25 DIAGNOSIS — E039 Hypothyroidism, unspecified: Secondary | ICD-10-CM | POA: Diagnosis present

## 2018-02-25 DIAGNOSIS — I130 Hypertensive heart and chronic kidney disease with heart failure and stage 1 through stage 4 chronic kidney disease, or unspecified chronic kidney disease: Secondary | ICD-10-CM | POA: Diagnosis present

## 2018-02-25 DIAGNOSIS — E119 Type 2 diabetes mellitus without complications: Secondary | ICD-10-CM | POA: Diagnosis not present

## 2018-02-25 DIAGNOSIS — I129 Hypertensive chronic kidney disease with stage 1 through stage 4 chronic kidney disease, or unspecified chronic kidney disease: Secondary | ICD-10-CM | POA: Diagnosis not present

## 2018-02-25 DIAGNOSIS — G2581 Restless legs syndrome: Secondary | ICD-10-CM | POA: Diagnosis present

## 2018-02-25 LAB — CBC WITH DIFFERENTIAL/PLATELET
Abs Immature Granulocytes: 0.08 10*3/uL — ABNORMAL HIGH (ref 0.00–0.07)
Basophils Absolute: 0 10*3/uL (ref 0.0–0.1)
Basophils Relative: 0 %
Eosinophils Absolute: 0 10*3/uL (ref 0.0–0.5)
Eosinophils Relative: 0 %
HEMATOCRIT: 34.4 % — AB (ref 36.0–46.0)
HEMOGLOBIN: 9.6 g/dL — AB (ref 12.0–15.0)
Immature Granulocytes: 2 %
LYMPHS ABS: 0.2 10*3/uL — AB (ref 0.7–4.0)
LYMPHS PCT: 5 %
MCH: 25.5 pg — AB (ref 26.0–34.0)
MCHC: 27.9 g/dL — AB (ref 30.0–36.0)
MCV: 91.2 fL (ref 80.0–100.0)
MONO ABS: 0.1 10*3/uL (ref 0.1–1.0)
MONOS PCT: 2 %
Neutro Abs: 3.6 10*3/uL (ref 1.7–7.7)
Neutrophils Relative %: 91 %
Platelets: 69 10*3/uL — ABNORMAL LOW (ref 150–400)
RBC: 3.77 MIL/uL — ABNORMAL LOW (ref 3.87–5.11)
RDW: 16.9 % — ABNORMAL HIGH (ref 11.5–15.5)
WBC: 3.9 10*3/uL — ABNORMAL LOW (ref 4.0–10.5)
nRBC: 0 % (ref 0.0–0.2)

## 2018-02-25 LAB — BASIC METABOLIC PANEL
Anion gap: 7 (ref 5–15)
BUN: 29 mg/dL — AB (ref 8–23)
CALCIUM: 8.6 mg/dL — AB (ref 8.9–10.3)
CHLORIDE: 97 mmol/L — AB (ref 98–111)
CO2: 37 mmol/L — AB (ref 22–32)
CREATININE: 1.5 mg/dL — AB (ref 0.44–1.00)
GFR calc Af Amer: 41 mL/min — ABNORMAL LOW (ref >60)
GFR calc non Af Amer: 36 mL/min — ABNORMAL LOW (ref >60)
GLUCOSE: 201 mg/dL — AB (ref 70–99)
Potassium: 5.4 mmol/L — ABNORMAL HIGH (ref 3.5–5.1)
Sodium: 141 mmol/L (ref 135–145)

## 2018-02-25 LAB — GLUCOSE, CAPILLARY
GLUCOSE-CAPILLARY: 275 mg/dL — AB (ref 70–99)
GLUCOSE-CAPILLARY: 220 mg/dL — AB (ref 70–99)
Glucose-Capillary: 173 mg/dL — ABNORMAL HIGH (ref 70–99)
Glucose-Capillary: 222 mg/dL — ABNORMAL HIGH (ref 70–99)
Glucose-Capillary: 235 mg/dL — ABNORMAL HIGH (ref 70–99)

## 2018-02-25 LAB — ABO/RH: ABO/RH(D): A POS

## 2018-02-25 LAB — HIV ANTIBODY (ROUTINE TESTING W REFLEX): HIV Screen 4th Generation wRfx: NONREACTIVE

## 2018-02-25 LAB — IRON AND TIBC
IRON: 20 ug/dL — AB (ref 28–170)
Saturation Ratios: 8 % — ABNORMAL LOW (ref 10.4–31.8)
TIBC: 240 ug/dL — ABNORMAL LOW (ref 250–450)
UIBC: 220 ug/dL

## 2018-02-25 LAB — TYPE AND SCREEN
ABO/RH(D): A POS
ANTIBODY SCREEN: NEGATIVE

## 2018-02-25 LAB — VITAMIN B12: Vitamin B-12: 444 pg/mL (ref 180–914)

## 2018-02-25 LAB — PROCALCITONIN: Procalcitonin: <0.1 ng/mL

## 2018-02-25 LAB — FERRITIN: FERRITIN: 95 ng/mL (ref 11–307)

## 2018-02-25 MED ORDER — OXYCODONE HCL 5 MG PO TABS
5.0000 mg | ORAL_TABLET | Freq: Once | ORAL | Status: AC
  Administered 2018-02-25: 5 mg via ORAL
  Filled 2018-02-25: qty 1

## 2018-02-25 MED ORDER — AZITHROMYCIN 250 MG PO TABS
500.0000 mg | ORAL_TABLET | Freq: Every day | ORAL | Status: DC
  Administered 2018-02-25 – 2018-02-28 (×4): 500 mg via ORAL
  Filled 2018-02-25 (×5): qty 2

## 2018-02-25 MED ORDER — NORTRIPTYLINE HCL 25 MG PO CAPS
100.0000 mg | ORAL_CAPSULE | Freq: Every day | ORAL | Status: DC
  Administered 2018-02-25 – 2018-03-01 (×5): 100 mg via ORAL
  Filled 2018-02-25 (×6): qty 4

## 2018-02-25 MED ORDER — NORTRIPTYLINE HCL 25 MG PO CAPS: 25.0000 mg | ORAL_CAPSULE | Freq: Every day | ORAL | Status: DC

## 2018-02-25 MED ORDER — IPRATROPIUM-ALBUTEROL 0.5-2.5 (3) MG/3ML IN SOLN
3.0000 mL | Freq: Three times a day (TID) | RESPIRATORY_TRACT | Status: DC
  Administered 2018-02-25 – 2018-03-02 (×17): 3 mL via RESPIRATORY_TRACT
  Filled 2018-02-25 (×17): qty 3

## 2018-02-25 MED ORDER — TORSEMIDE 20 MG PO TABS
40.0000 mg | ORAL_TABLET | Freq: Every day | ORAL | Status: DC
  Administered 2018-02-25 – 2018-02-27 (×3): 40 mg via ORAL
  Filled 2018-02-25 (×4): qty 2

## 2018-02-25 NOTE — Progress Notes (Signed)
Pt states that she does not want to wear CPAP tonight, machine remains ready at bedside.  RT to monitor and assess as needed.
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
## 2018-02-26 LAB — CBC WITH DIFFERENTIAL/PLATELET
ABS IMMATURE GRANULOCYTES: 0.09 10*3/uL — AB (ref 0.00–0.07)
BASOS ABS: 0 10*3/uL (ref 0.0–0.1)
Basophils Relative: 0 %
EOS ABS: 0 10*3/uL (ref 0.0–0.5)
Eosinophils Relative: 0 %
HEMATOCRIT: 36.3 % (ref 36.0–46.0)
Hemoglobin: 9.9 g/dL — ABNORMAL LOW (ref 12.0–15.0)
IMMATURE GRANULOCYTES: 1 %
LYMPHS ABS: 0.3 10*3/uL — AB (ref 0.7–4.0)
LYMPHS PCT: 4 %
MCH: 25.2 pg — ABNORMAL LOW (ref 26.0–34.0)
MCHC: 27.3 g/dL — ABNORMAL LOW (ref 30.0–36.0)
MCV: 92.4 fL (ref 80.0–100.0)
MONOS PCT: 3 %
Monocytes Absolute: 0.3 10*3/uL (ref 0.1–1.0)
NEUTROS PCT: 92 %
NRBC: 0 % (ref 0.0–0.2)
Neutro Abs: 7 10*3/uL (ref 1.7–7.7)
Platelets: 82 10*3/uL — ABNORMAL LOW (ref 150–400)
RBC: 3.93 MIL/uL (ref 3.87–5.11)
RDW: 16.6 % — AB (ref 11.5–15.5)
WBC: 7.6 10*3/uL (ref 4.0–10.5)

## 2018-02-26 LAB — GLUCOSE, CAPILLARY
GLUCOSE-CAPILLARY: 192 mg/dL — AB (ref 70–99)
GLUCOSE-CAPILLARY: 194 mg/dL — AB (ref 70–99)
Glucose-Capillary: 211 mg/dL — ABNORMAL HIGH (ref 70–99)
Glucose-Capillary: 229 mg/dL — ABNORMAL HIGH (ref 70–99)

## 2018-02-26 LAB — RENAL FUNCTION PANEL
ALBUMIN: 3.3 g/dL — AB (ref 3.5–5.0)
ANION GAP: 10 (ref 5–15)
BUN: 31 mg/dL — AB (ref 8–23)
CHLORIDE: 95 mmol/L — AB (ref 98–111)
CO2: 35 mmol/L — ABNORMAL HIGH (ref 22–32)
Calcium: 9.1 mg/dL (ref 8.9–10.3)
Creatinine, Ser: 1.42 mg/dL — ABNORMAL HIGH (ref 0.44–1.00)
GFR, EST AFRICAN AMERICAN: 44 mL/min — AB (ref >60)
GFR, EST NON AFRICAN AMERICAN: 38 mL/min — AB (ref >60)
Glucose, Bld: 191 mg/dL — ABNORMAL HIGH (ref 70–99)
PHOSPHORUS: 3.5 mg/dL (ref 2.5–4.6)
POTASSIUM: 5.6 mmol/L — AB (ref 3.5–5.1)
Sodium: 140 mmol/L (ref 135–145)

## 2018-02-26 MED ORDER — PREDNISONE 20 MG PO TABS
60.0000 mg | ORAL_TABLET | Freq: Every day | ORAL | Status: DC
  Administered 2018-02-27 – 2018-02-28 (×2): 60 mg via ORAL
  Filled 2018-02-26 (×2): qty 3

## 2018-02-26 MED ORDER — ACETAZOLAMIDE ER 500 MG PO CP12
500.0000 mg | ORAL_CAPSULE | Freq: Two times a day (BID) | ORAL | Status: DC
  Administered 2018-02-26 – 2018-02-28 (×4): 500 mg via ORAL
  Filled 2018-02-26 (×5): qty 1
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
## 2018-02-26 NOTE — Progress Notes (Signed)
Pt. Refuses CPAP at this time.  Will be available if patient changes her mind.  Machine remains in room.

## 2018-02-27 ENCOUNTER — Inpatient Hospital Stay (HOSPITAL_COMMUNITY): Payer: Medicare Other

## 2018-02-27 ENCOUNTER — Telehealth: Payer: Self-pay | Admitting: Pulmonary Disease

## 2018-02-27 DIAGNOSIS — J9811 Atelectasis: Secondary | ICD-10-CM

## 2018-02-27 DIAGNOSIS — J441 Chronic obstructive pulmonary disease with (acute) exacerbation: Principal | ICD-10-CM

## 2018-02-27 DIAGNOSIS — R0602 Shortness of breath: Secondary | ICD-10-CM

## 2018-02-27 LAB — GLUCOSE, CAPILLARY
GLUCOSE-CAPILLARY: 109 mg/dL — AB (ref 70–99)
GLUCOSE-CAPILLARY: 173 mg/dL — AB (ref 70–99)
Glucose-Capillary: 204 mg/dL — ABNORMAL HIGH (ref 70–99)
Glucose-Capillary: 169 mg/dL — ABNORMAL HIGH (ref 70–99)

## 2018-02-27 LAB — BASIC METABOLIC PANEL
Anion gap: 10 (ref 5–15)
BUN: 45 mg/dL — AB (ref 8–23)
CO2: 38 mmol/L — ABNORMAL HIGH (ref 22–32)
CREATININE: 1.57 mg/dL — AB (ref 0.44–1.00)
Calcium: 9 mg/dL (ref 8.9–10.3)
Chloride: 88 mmol/L — ABNORMAL LOW (ref 98–111)
GFR, EST AFRICAN AMERICAN: 39 mL/min — AB (ref >60)
GFR, EST NON AFRICAN AMERICAN: 34 mL/min — AB (ref >60)
Glucose, Bld: 125 mg/dL — ABNORMAL HIGH (ref 70–99)
Potassium: 5 mmol/L (ref 3.5–5.1)
SODIUM: 136 mmol/L (ref 135–145)

## 2018-02-27 MED ORDER — TORSEMIDE 20 MG PO TABS
40.0000 mg | ORAL_TABLET | Freq: Two times a day (BID) | ORAL | Status: DC
  Administered 2018-02-27 – 2018-02-28 (×2): 40 mg via ORAL
  Filled 2018-02-27 (×2): qty 2

## 2018-02-27 MED ORDER — GUAIFENESIN ER 600 MG PO TB12
600.0000 mg | ORAL_TABLET | Freq: Two times a day (BID) | ORAL | Status: DC
  Administered 2018-02-27 – 2018-03-02 (×7): 600 mg via ORAL
  Filled 2018-02-27 (×7): qty 1

## 2018-02-27 MED ORDER — CLOTRIMAZOLE 1 % EX CREA
TOPICAL_CREAM | Freq: Two times a day (BID) | CUTANEOUS | Status: DC
  Administered 2018-02-27 – 2018-03-02 (×5): via TOPICAL
  Filled 2018-02-27: qty 15

## 2018-02-27 MED ORDER — PATIROMER SORBITEX CALCIUM 8.4 G PO PACK
8.4000 g | PACK | Freq: Every day | ORAL | Status: DC
  Filled 2018-02-27: qty 1

## 2018-02-27 MED ORDER — FLUCONAZOLE 150 MG PO TABS
150.0000 mg | ORAL_TABLET | Freq: Once | ORAL | Status: AC
  Administered 2018-02-27: 150 mg via ORAL
  Filled 2018-02-27: qty 1

## 2018-02-27 MED ORDER — LIP MEDEX EX OINT
TOPICAL_OINTMENT | CUTANEOUS | Status: AC
  Administered 2018-02-27: 19:00:00
  Filled 2018-02-27: qty 7

## 2018-02-27 MED ORDER — PROBIOTIC 250 MG PO CAPS: 250.0000 mg | ORAL_CAPSULE | Freq: Every day | ORAL | Status: DC

## 2018-02-27 MED ORDER — SACCHAROMYCES BOULARDII 250 MG PO CAPS
250.0000 mg | ORAL_CAPSULE | Freq: Two times a day (BID) | ORAL | Status: DC
  Administered 2018-02-27 – 2018-03-02 (×7): 250 mg via ORAL
  Filled 2018-02-27 (×7): qty 1
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
## 2018-02-27 NOTE — Progress Notes (Signed)
Pt. Continues to refuses CPAP.  Available if patient changes her mind

## 2018-02-27 NOTE — Telephone Encounter (Signed)
Received a call from [name] with [facility] stating the CT order needed to be changed for pt's CT from a CT limited to a CT wo contrast.  I placed the new order for pt's CT. Nothing further needed.

## 2018-02-28 LAB — COMPREHENSIVE METABOLIC PANEL
ALT: 36 U/L (ref 0–44)
AST: 27 U/L (ref 15–41)
Albumin: 3.1 g/dL — ABNORMAL LOW (ref 3.5–5.0)
Alkaline Phosphatase: 55 U/L (ref 38–126)
Anion gap: 13 (ref 5–15)
BUN: 55 mg/dL — ABNORMAL HIGH (ref 8–23)
CHLORIDE: 86 mmol/L — AB (ref 98–111)
CO2: 40 mmol/L — ABNORMAL HIGH (ref 22–32)
CREATININE: 1.93 mg/dL — AB (ref 0.44–1.00)
Calcium: 8.7 mg/dL — ABNORMAL LOW (ref 8.9–10.3)
GFR, EST AFRICAN AMERICAN: 30 mL/min — AB (ref >60)
GFR, EST NON AFRICAN AMERICAN: 26 mL/min — AB (ref >60)
Glucose, Bld: 154 mg/dL — ABNORMAL HIGH (ref 70–99)
POTASSIUM: 4.3 mmol/L (ref 3.5–5.1)
Sodium: 139 mmol/L (ref 135–145)
TOTAL PROTEIN: 6.1 g/dL — AB (ref 6.5–8.1)
Total Bilirubin: 0.5 mg/dL (ref 0.3–1.2)

## 2018-02-28 LAB — CBC WITH DIFFERENTIAL/PLATELET
ABS IMMATURE GRANULOCYTES: 0.09 10*3/uL — AB (ref 0.00–0.07)
BASOS PCT: 0 %
Basophils Absolute: 0 10*3/uL (ref 0.0–0.1)
EOS PCT: 0 %
Eosinophils Absolute: 0 10*3/uL (ref 0.0–0.5)
HEMATOCRIT: 34.1 % — AB (ref 36.0–46.0)
HEMOGLOBIN: 9.4 g/dL — AB (ref 12.0–15.0)
Immature Granulocytes: 1 %
LYMPHS PCT: 7 %
Lymphs Abs: 0.5 10*3/uL — ABNORMAL LOW (ref 0.7–4.0)
MCH: 24.7 pg — AB (ref 26.0–34.0)
MCHC: 27.6 g/dL — ABNORMAL LOW (ref 30.0–36.0)
MCV: 89.7 fL (ref 80.0–100.0)
Monocytes Absolute: 0.8 10*3/uL (ref 0.1–1.0)
Monocytes Relative: 11 %
NEUTROS ABS: 6.1 10*3/uL (ref 1.7–7.7)
NRBC: 0 % (ref 0.0–0.2)
Neutrophils Relative %: 81 %
Platelets: 85 10*3/uL — ABNORMAL LOW (ref 150–400)
RBC: 3.8 MIL/uL — AB (ref 3.87–5.11)
RDW: 16.7 % — ABNORMAL HIGH (ref 11.5–15.5)
WBC: 7.6 10*3/uL (ref 4.0–10.5)

## 2018-02-28 LAB — GLUCOSE, CAPILLARY
Glucose-Capillary: 128 mg/dL — ABNORMAL HIGH (ref 70–99)
Glucose-Capillary: 146 mg/dL — ABNORMAL HIGH (ref 70–99)
Glucose-Capillary: 243 mg/dL — ABNORMAL HIGH (ref 70–99)

## 2018-02-28 MED ORDER — LEVOFLOXACIN 750 MG PO TABS
750.0000 mg | ORAL_TABLET | ORAL | Status: DC
  Administered 2018-02-28 – 2018-03-01 (×2): 750 mg via ORAL
  Filled 2018-02-28 (×2): qty 1

## 2018-02-28 MED ORDER — TORSEMIDE 20 MG PO TABS: 40.0000 mg | ORAL_TABLET | Freq: Every day | ORAL | Status: DC

## 2018-02-28 MED ORDER — PREDNISONE 20 MG PO TABS
20.0000 mg | ORAL_TABLET | Freq: Every day | ORAL | Status: DC
  Administered 2018-03-01 – 2018-03-02 (×2): 20 mg via ORAL
  Filled 2018-02-28 (×2): qty 1

## 2018-02-28 NOTE — Progress Notes (Signed)
Pt has declined use of CPAP QHS, machine remains at bedside.  RT to monitor and assess as needed.  
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
## 2018-02-28 NOTE — Care Management Important Message (Signed)
Important Message  Patient Details  Name: Renee Noble MRN: [id number] Date of Birth: 1955/07/26   Medicare Important Message Given:  Yes    [name] 02/28/2018, 10:48 AMImportant Message  Patient Details  Name: Renee Noble MRN: [id number] Date of Birth: Jan 20, 1956   Medicare Important Message Given:  Yes    [name] 02/28/2018, 10:48 AM
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
## 2018-02-28 NOTE — Progress Notes (Signed)
Addend  D/w Dr. Charisse March cannot really diurese given R sided sym and should STOP diuretics Will ge tIR to tap Will chang eAbx to empiric Levaquin [overread CT shows ? Bronchograms in post-lat lung fields] He will see  Verneita Griffes, MD [facility] 11:31 AM
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
## 2018-03-01 DIAGNOSIS — G4733 Obstructive sleep apnea (adult) (pediatric): Secondary | ICD-10-CM

## 2018-03-01 LAB — CBC WITH DIFFERENTIAL/PLATELET
Abs Immature Granulocytes: 0.12 10*3/uL — ABNORMAL HIGH (ref 0.00–0.07)
BASOS PCT: 0 %
Basophils Absolute: 0 10*3/uL (ref 0.0–0.1)
EOS ABS: 0 10*3/uL (ref 0.0–0.5)
Eosinophils Relative: 0 %
HCT: 38.9 % (ref 36.0–46.0)
Hemoglobin: 11.1 g/dL — ABNORMAL LOW (ref 12.0–15.0)
Immature Granulocytes: 2 %
Lymphocytes Relative: 6 %
Lymphs Abs: 0.4 10*3/uL — ABNORMAL LOW (ref 0.7–4.0)
MCH: 25.2 pg — ABNORMAL LOW (ref 26.0–34.0)
MCHC: 28.5 g/dL — ABNORMAL LOW (ref 30.0–36.0)
MCV: 88.2 fL (ref 80.0–100.0)
MONO ABS: 0.5 10*3/uL (ref 0.1–1.0)
MONOS PCT: 7 %
NEUTROS PCT: 85 %
Neutro Abs: 6 10*3/uL (ref 1.7–7.7)
PLATELETS: 90 10*3/uL — AB (ref 150–400)
RBC: 4.41 MIL/uL (ref 3.87–5.11)
RDW: 16.7 % — AB (ref 11.5–15.5)
WBC: 7.1 10*3/uL (ref 4.0–10.5)
nRBC: 0 % (ref 0.0–0.2)

## 2018-03-01 LAB — COMPREHENSIVE METABOLIC PANEL
ALBUMIN: 3.7 g/dL (ref 3.5–5.0)
ALT: 29 U/L (ref 0–44)
ANION GAP: 10 (ref 5–15)
AST: 17 U/L (ref 15–41)
Alkaline Phosphatase: 57 U/L (ref 38–126)
BILIRUBIN TOTAL: 0.7 mg/dL (ref 0.3–1.2)
BUN: 68 mg/dL — AB (ref 8–23)
CALCIUM: 8.8 mg/dL — AB (ref 8.9–10.3)
CO2: 40 mmol/L — ABNORMAL HIGH (ref 22–32)
Chloride: 88 mmol/L — ABNORMAL LOW (ref 98–111)
Creatinine, Ser: 2.02 mg/dL — ABNORMAL HIGH (ref 0.44–1.00)
GFR calc Af Amer: 29 mL/min — ABNORMAL LOW (ref >60)
GFR calc non Af Amer: 25 mL/min — ABNORMAL LOW (ref >60)
GLUCOSE: 150 mg/dL — AB (ref 70–99)
Potassium: 4.4 mmol/L (ref 3.5–5.1)
Sodium: 138 mmol/L (ref 135–145)
TOTAL PROTEIN: 7 g/dL (ref 6.5–8.1)

## 2018-03-01 LAB — GLUCOSE, CAPILLARY
GLUCOSE-CAPILLARY: 152 mg/dL — AB (ref 70–99)
Glucose-Capillary: 182 mg/dL — ABNORMAL HIGH (ref 70–99)
Glucose-Capillary: 137 mg/dL — ABNORMAL HIGH (ref 70–99)
Glucose-Capillary: 119 mg/dL — ABNORMAL HIGH (ref 70–99)
Glucose-Capillary: 192 mg/dL — ABNORMAL HIGH (ref 70–99)

## 2018-03-01 LAB — PHOSPHORUS: Phosphorus: 4.6 mg/dL (ref 2.5–4.6)

## 2018-03-01 NOTE — Progress Notes (Signed)
[facility] Progress Note  Subjective: 4 L UOP yest  Vitals:   03/01/18 1106 03/01/18 1342 03/01/18 1442 03/01/18 1500  BP: 132/65 132/80    Pulse: 97 89    Resp:      Temp: (!) 97.3 F (36.3 C) 97.7 F (36.5 C)    TempSrc: Oral Oral    SpO2: 98% 97% 96%   Weight:    118.8 kg  Height:        Inpatient medications: . clotrimazole   Topical BID  . guaiFENesin  600 mg Oral BID  . insulin aspart  0-5 Units Subcutaneous QHS  . insulin aspart  0-9 Units Subcutaneous TID WC  . insulin glargine  15 Units Subcutaneous Daily  . ipratropium-albuterol  3 mL Nebulization TID  . levofloxacin  750 mg Oral Q48H  . levothyroxine  88 mcg Oral QAC breakfast  . mometasone-formoterol  2 puff Inhalation BID  . morphine  30 mg Oral BID  . nortriptyline  100 mg Oral QHS  . pravastatin  20 mg Oral q1800  . predniSONE  20 mg Oral QAC breakfast  . saccharomyces boulardii  250 mg Oral BID  . sodium chloride flush  3 mL Intravenous Q12H   . sodium chloride Stopped (02/25/18 0205)   sodium chloride, acetaminophen **OR** acetaminophen, albuterol, bisacodyl, metoCLOPramide, oxyCODONE, senna-docusate, sodium chloride flush, tiZANidine  Iron/TIBC/Ferritin/ %Sat    Component Value Date/Time   IRON 20 (L) 02/24/2018 2238   TIBC 240 (L) 02/24/2018 2238   FERRITIN 95 02/24/2018 2238   IRONPCTSAT 8 (L) 02/24/2018 2238    Exam: Gen morbidly obese, pleasant, nasal O2 No jvd ,flat neck veins Chest clear on R, crackles L base RRR no MRG ABd obese soft ntnd no ascites grossly Ext brawny chronic 2+ bilat LE edema, no wounds or ulcers Neuro is alert, Ox 3 , nf   Date               Creat               egFR 2015 - 2017     0.9- 1.2            55- 70 2018                1.1- 1.18 May 2017         1.27- 1.41          Jul 2019         1.52- 1.82  Feb 24 2018   1.48                 36          Oct 14 1.57                  34          Oct 15 1.93                  26   CXR  retrocardiac density, no edema  CT chest - air bronchograms L base w/ assoc effusion   Home meds:  - losartan 25 qd/ torsemide 20 qd  - pravastatin 20/ home O2/ nortriptyline 75hs/ metoclopramide 10 qid prn/ synthroid 88ug  - duoneb prn  - insulin glargine 30 u qd/ humalog 15 tid sq  - embeda bid/ symbicort bid/ prn's / vitamins   Impression/ Plan: 1. AKI on CKD  3- baseline creat 1.5, up to 1.9 after attempted diuresis.  Suspect RHF/ pulmHTN / pickwickian from OHS/ obesity w/ R sided edema not responding to diuresis.  No pulm edema on imaging.  Probably will need to use non pharm methods to treat LE edema (hose, elevation).  No other recommendations at this time, will sign off.  2. DM on insulin 3. HTN - BP's good on no BP medications.  Would avoid acei/ARB if BP med needed given R HF would be at high risk for AKI on either acei or ARB.   4. Morbid obesity  5. Chronic resp failure /COPD/ OHS/ OSA 6. H/o treated hep C 7. H/o left nephrectomy - for [facility]  Kelly Splinter MD [facility] pager [phone]   03/01/2018, 4:20 PM   Recent Labs  Lab 02/26/18 0453  02/28/18 0421 03/01/18 0536  NA 140   < > 139 138  K 5.6*   < > 4.3 4.4  CL 95*   < > 86* 88*  CO2 35*   < > 40* 40*  GLUCOSE 191*   < > 154* 150*  BUN 31*   < > 55* 68*  CREATININE 1.42*   < > 1.93* 2.02*  CALCIUM 9.1   < > 8.7* 8.8*  PHOS 3.5  --   --  4.6  ALBUMIN 3.3*  --  3.1* 3.7   < > = values in this interval not displayed.   Recent Labs  Lab 02/28/18 0421 03/01/18 0536  AST 27 17  ALT 36 29  ALKPHOS 55 57  BILITOT 0.5 0.7  PROT 6.1* 7.0   Recent Labs  Lab 02/28/18 0421 03/01/18 0536  WBC 7.6 7.1  NEUTROABS 6.1 6.0  HGB 9.4* 11.1*  HCT 34.1* 38.9  MCV 89.7 88.2  PLT 85* 90*
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
## 2018-03-02 LAB — CBC WITH DIFFERENTIAL/PLATELET
Abs Immature Granulocytes: 0.15 10*3/uL — ABNORMAL HIGH (ref 0.00–0.07)
BASOS PCT: 0 %
Basophils Absolute: 0 10*3/uL (ref 0.0–0.1)
Eosinophils Absolute: 0 10*3/uL (ref 0.0–0.5)
Eosinophils Relative: 0 %
HCT: 33.8 % — ABNORMAL LOW (ref 36.0–46.0)
HEMOGLOBIN: 9.6 g/dL — AB (ref 12.0–15.0)
IMMATURE GRANULOCYTES: 3 %
LYMPHS ABS: 0.6 10*3/uL — AB (ref 0.7–4.0)
Lymphocytes Relative: 11 %
MCH: 25.1 pg — ABNORMAL LOW (ref 26.0–34.0)
MCHC: 28.4 g/dL — ABNORMAL LOW (ref 30.0–36.0)
MCV: 88.3 fL (ref 80.0–100.0)
MONOS PCT: 8 %
Monocytes Absolute: 0.4 10*3/uL (ref 0.1–1.0)
NEUTROS PCT: 78 %
Neutro Abs: 4.4 10*3/uL (ref 1.7–7.7)
PLATELETS: 79 10*3/uL — AB (ref 150–400)
RBC: 3.83 MIL/uL — ABNORMAL LOW (ref 3.87–5.11)
RDW: 16.8 % — ABNORMAL HIGH (ref 11.5–15.5)
WBC: 5.5 10*3/uL (ref 4.0–10.5)
nRBC: 0 % (ref 0.0–0.2)

## 2018-03-02 LAB — COMPREHENSIVE METABOLIC PANEL
ALBUMIN: 3.2 g/dL — AB (ref 3.5–5.0)
ALT: 22 U/L (ref 0–44)
ANION GAP: 9 (ref 5–15)
AST: 14 U/L — AB (ref 15–41)
Alkaline Phosphatase: 47 U/L (ref 38–126)
BUN: 67 mg/dL — ABNORMAL HIGH (ref 8–23)
CHLORIDE: 95 mmol/L — AB (ref 98–111)
CO2: 36 mmol/L — AB (ref 22–32)
Calcium: 8.5 mg/dL — ABNORMAL LOW (ref 8.9–10.3)
Creatinine, Ser: 1.98 mg/dL — ABNORMAL HIGH (ref 0.44–1.00)
GFR calc Af Amer: 30 mL/min — ABNORMAL LOW (ref >60)
GFR calc non Af Amer: 26 mL/min — ABNORMAL LOW (ref >60)
GLUCOSE: 140 mg/dL — AB (ref 70–99)
POTASSIUM: 4.1 mmol/L (ref 3.5–5.1)
SODIUM: 140 mmol/L (ref 135–145)
TOTAL PROTEIN: 6.1 g/dL — AB (ref 6.5–8.1)
Total Bilirubin: 0.5 mg/dL (ref 0.3–1.2)

## 2018-03-02 LAB — GLUCOSE, CAPILLARY
GLUCOSE-CAPILLARY: 100 mg/dL — AB (ref 70–99)
Glucose-Capillary: 163 mg/dL — ABNORMAL HIGH (ref 70–99)
Glucose-Capillary: 237 mg/dL — ABNORMAL HIGH (ref 70–99)

## 2018-03-02 MED ORDER — LEVOFLOXACIN 750 MG PO TABS: 750.0000 mg | ORAL_TABLET | ORAL | Status: DC

## 2018-03-02 MED ORDER — SENNOSIDES-DOCUSATE SODIUM 8.6-50 MG PO TABS: 1.0000 | ORAL_TABLET | Freq: Every day | ORAL | 0 refills | Status: DC

## 2018-03-02 MED ORDER — PREDNISONE 10 MG PO TABS: ORAL_TABLET | ORAL | 0 refills | Status: AC

## 2018-03-02 MED ORDER — LEVOFLOXACIN 750 MG PO TABS: 750.0000 mg | ORAL_TABLET | ORAL | 0 refills | Status: AC

## 2018-03-02 MED ORDER — GUAIFENESIN ER 600 MG PO TB12: 600.0000 mg | ORAL_TABLET | Freq: Two times a day (BID) | ORAL | 0 refills | Status: DC

## 2018-03-02 NOTE — Plan of Care (Signed)
Pt alert and oriented, resting with no complaints this am.  Plan of care discussed with pt. RN will monitor.

## 2018-03-02 NOTE — Progress Notes (Signed)
Discharge paperwork discussed with pt.  Pt demonstrated understanding and was escorted by wheelchair in stable condition to main lobby.
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
## 2018-03-02 NOTE — Discharge Instructions (Signed)
Renee Noble,  You have been treated for pneumonia and COPD exacerbation. You also were found to have some fluid in your lung. This will need to be followed up by your pulmonologist. Your kidney function worsened a little bit in the setting of torsemide. This has been discontinued. Please see your primary care or nephrologist to recheck your kidney function.

## 2018-03-03 ENCOUNTER — Other Ambulatory Visit: Payer: Self-pay | Admitting: Physician Assistant

## 2018-03-03 DIAGNOSIS — G8929 Other chronic pain: Secondary | ICD-10-CM

## 2018-03-03 NOTE — Telephone Encounter (Addendum)
Call placed to patient. Left VM to return call to office (re: request to change pharmacy) for Pamelor 25mg . Does she also need the 75mg  Pamelor  changed to the new pharmacy as well.

## 2018-03-03 NOTE — Telephone Encounter (Signed)
Copied from [place] [phone]. Topic: Quick Communication - Rx Refill/Question >> Mar 03, 2018  1:53 PM Reyne Dumas L wrote: Medication: nortriptyline (PAMELOR) 25 MG capsule  Has the patient contacted their pharmacy? Yes - but states this is for a new pharmacy (Agent: If no, request that the patient contact the pharmacy for the refill.) (Agent: If yes, when and what did the pharmacy advise?)  Preferred Pharmacy (with phone number or street name): [facility], [place], [place] - [address] [phone] (Phone) [phone] (Fax)  Agent: Please be advised that RX refills may take up to 3 business days. We ask that you follow-up with your pharmacy.

## 2018-03-06 ENCOUNTER — Telehealth: Payer: Self-pay

## 2018-03-06 NOTE — Telephone Encounter (Signed)
Spoke with patients cousin, [name] (okay per DPR).   Transition Care Management Follow-up Telephone Call   Admit date: 02/24/2018 Discharge date: 03/02/2018 Principal Problem:  COPD with acute exacerbation    How have you been since you were released from the hospital? "She's doing really good considering all that was wrong with her"   Do you understand why you were in the hospital? yes, "she had congestive heart failure, COPD, and pneumonia".    Do you understand the discharge instructions? yes   Where were you discharged to? Home.    Items Reviewed:  Medications reviewed: no, advised to bring to appt   Allergies reviewed: no  Dietary changes reviewed: yes  Referrals reviewed: yes   Functional Questionnaire:   Activities of Daily Living (ADLs):   She states they are independent in the following: ambulation, bathing and hygiene, feeding, continence, grooming, toileting and dressing States they require assistance with the following: None   Any transportation issues/concerns?: no   Any patient concerns? no   Confirmed importance and date/time of follow-up visits scheduled yes  Provider Appointment booked with PCP 03/14/18. Encouraged sooner appt with another provider in practice, patient will call back if willing to change.   Confirmed with patient if condition begins to worsen call PCP or go to the ER.  Patient was given the office number and encouraged to call back with question or concerns.  : yes

## 2018-03-08 MED ORDER — NORTRIPTYLINE HCL 25 MG PO CAPS: 25.0000 mg | ORAL_CAPSULE | Freq: Every day | ORAL | 1 refills | Status: DC

## 2018-03-08 NOTE — Telephone Encounter (Signed)
Call to [facility]- they state patient can call for transfer of Rx- call to patient- she states she has tried to get the transfer and they have only transferred one dose. Call to [facility]- they state they have 75 mg #90 with 1 RF. Matching Rx sent to them so patient will have correct 100 mg dosing.  Requested Prescriptions  Pending Prescriptions Disp Refills  . nortriptyline (PAMELOR) 25 MG capsule 90 capsule 1    Sig: Take 1 capsule (25 mg total) by mouth at bedtime.     Psychiatry:  Antidepressants - Heterocyclics (TCAs) Passed - 03/08/2018 10:32 AM      Passed - Valid encounter within last 6 months    Recent Outpatient Visits          3 weeks ago Cellulitis of right lower extremity   [facility] [place] [place], [place], [place]   3 months ago Intermittent vomiting   [facility] [place], [place], [place]   9 months ago Cellulitis of left lower extremity   [facility] [place], [place], [place]   9 months ago Wound of left lower extremity, subsequent encounter   [facility] [place], [place], [place]   10 months ago Laceration of left lower leg, subsequent encounter   [facility] [place], [place]      Future Appointments            In 6 days [name], PA-C [facility] [place], [place]   In 2 months Melvyn Novas, Christena Deem, MD [facility]         Refused Prescriptions Disp Refills  . nortriptyline (PAMELOR) 75 MG capsule 30 capsule 5    Sig: Take 1 capsule (75 mg total) by mouth at bedtime.     Psychiatry:  Antidepressants - Heterocyclics (TCAs) Passed - 03/08/2018 10:32 AM      Passed - Valid encounter within last 6 months    Recent Outpatient Visits          3 weeks ago Cellulitis of right lower extremity   [facility]  [place], [place], [place]   3 months ago Intermittent vomiting   [facility] [place], [place], [place]   9 months ago Cellulitis of left lower extremity   [facility] [place], [place], [place]   9 months ago Wound of left lower extremity, subsequent encounter   [facility] [place], [place], [place]   10 months ago Laceration of left lower leg, subsequent encounter   [facility] [place], [place], [place]      Future Appointments            In 6 days [name], PA-C [facility] [place], [place]   In 2 months Melvyn Novas, Christena Deem, MD [facility]

## 2018-03-08 NOTE — Telephone Encounter (Signed)
Pt is calling back and she needs 75 mg pamelor [facility] in [place] also

## 2018-03-09 DIAGNOSIS — G4733 Obstructive sleep apnea (adult) (pediatric): Secondary | ICD-10-CM | POA: Diagnosis not present

## 2018-03-14 ENCOUNTER — Other Ambulatory Visit: Payer: Self-pay

## 2018-03-14 ENCOUNTER — Encounter: Payer: Self-pay | Admitting: Physician Assistant

## 2018-03-14 ENCOUNTER — Ambulatory Visit (INDEPENDENT_AMBULATORY_CARE_PROVIDER_SITE_OTHER): Payer: Medicare Other | Admitting: Physician Assistant

## 2018-03-14 VITALS — BP 112/60 | HR 92 | Temp 98.5°F | Resp 16 | Ht 64.0 in | Wt 273.0 lb

## 2018-03-14 DIAGNOSIS — J441 Chronic obstructive pulmonary disease with (acute) exacerbation: Secondary | ICD-10-CM | POA: Diagnosis not present

## 2018-03-14 DIAGNOSIS — J181 Lobar pneumonia, unspecified organism: Secondary | ICD-10-CM | POA: Diagnosis not present

## 2018-03-14 DIAGNOSIS — N184 Chronic kidney disease, stage 4 (severe): Secondary | ICD-10-CM | POA: Diagnosis not present

## 2018-03-14 DIAGNOSIS — J189 Pneumonia, unspecified organism: Secondary | ICD-10-CM

## 2018-03-14 DIAGNOSIS — N179 Acute kidney failure, unspecified: Secondary | ICD-10-CM | POA: Diagnosis not present

## 2018-03-14 LAB — CBC WITH DIFFERENTIAL/PLATELET
Basophils Absolute: 0 10*3/uL (ref 0.0–0.1)
Basophils Relative: 0.2 % (ref 0.0–3.0)
EOS ABS: 0.1 10*3/uL (ref 0.0–0.7)
EOS PCT: 1.3 % (ref 0.0–5.0)
HEMATOCRIT: 34.3 % — AB (ref 36.0–46.0)
HEMOGLOBIN: 10.6 g/dL — AB (ref 12.0–15.0)
LYMPHS PCT: 12.6 % (ref 12.0–46.0)
Lymphs Abs: 0.7 10*3/uL (ref 0.7–4.0)
MCHC: 31 g/dL (ref 30.0–36.0)
MCV: 84 fl (ref 78.0–100.0)
MONOS PCT: 8.2 % (ref 3.0–12.0)
Monocytes Absolute: 0.5 10*3/uL (ref 0.1–1.0)
Neutro Abs: 4.3 10*3/uL (ref 1.4–7.7)
Neutrophils Relative %: 77.7 % — ABNORMAL HIGH (ref 43.0–77.0)
Platelets: 61 10*3/uL — ABNORMAL LOW (ref 150.0–400.0)
RBC: 4.08 Mil/uL (ref 3.87–5.11)
RDW: 19.2 % — AB (ref 11.5–15.5)
WBC: 5.6 10*3/uL (ref 4.0–10.5)

## 2018-03-14 LAB — COMPREHENSIVE METABOLIC PANEL
ALBUMIN: 3.8 g/dL (ref 3.5–5.2)
ALK PHOS: 54 U/L (ref 39–117)
ALT: 12 U/L (ref 0–35)
AST: 14 U/L (ref 0–37)
BILIRUBIN TOTAL: 0.4 mg/dL (ref 0.2–1.2)
BUN: 30 mg/dL — ABNORMAL HIGH (ref 6–23)
CO2: 39 mEq/L — ABNORMAL HIGH (ref 19–32)
Calcium: 8.9 mg/dL (ref 8.4–10.5)
Chloride: 99 mEq/L (ref 96–112)
Creatinine, Ser: 1.18 mg/dL (ref 0.40–1.20)
GFR: 48.91 mL/min — AB (ref >60.00)
Glucose, Bld: 61 mg/dL — ABNORMAL LOW (ref 70–99)
POTASSIUM: 4.9 meq/L (ref 3.5–5.1)
Sodium: 140 mEq/L (ref 135–145)
TOTAL PROTEIN: 6.3 g/dL (ref 6.0–8.3)

## 2018-03-14 MED ORDER — IPRATROPIUM-ALBUTEROL 0.5-2.5 (3) MG/3ML IN SOLN
3.0000 mL | Freq: Once | RESPIRATORY_TRACT | Status: AC
  Administered 2018-03-14: 3 mL via RESPIRATORY_TRACT

## 2018-03-14 NOTE — Patient Instructions (Signed)
Please go to the lab today for blood work.  I will call you with your results. We will alter treatment regimen(s) if indicated by your results.   Go to the [facility] office for x-ray. I will call ASAP with results.   Start the prednisone again as directed. We may have to add on additional days of Levaquin.   Schedule follow-up with Pulmonology and nephrology when you leave here today. We will schedule follow-up with me based on results.

## 2018-03-14 NOTE — Progress Notes (Signed)
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 6.0 1.7 - 7.7 K/uL   Lymphocytes Relative 6 %   Lymphs Abs 0.4 (L) 0.7 - 4.0 K/uL   Monocytes Relative 7 %   Monocytes Absolute 0.5 0.1 - 1.0 K/uL   Eosinophils Relative 0 %   Eosinophils Absolute 0.0 0.0 - 0.5 K/uL   Basophils Relative 0 %   Basophils Absolute 0.0 0.0 - 0.1 K/uL   Immature Granulocytes 2 %   Abs Immature Granulocytes 0.12 (H) 0.00 - 0.07 K/uL     Comment: Performed at [facility], [place] [address] [place], [place] [address]  Phosphorus     Status: None   Collection Time: 03/01/18  5:36 AM  Result Value Ref Range   Phosphorus 4.6 2.5 - 4.6 mg/dL    Comment: Performed at [facility], [place] [address], [place], [place] [address]  Glucose, capillary     Status: Abnormal   Collection Time: 03/01/18  7:38 AM  Result Value Ref Range   Glucose-Capillary 137 (H) 70 - 99 mg/dL   Comment 1 Notify RN    Comment 2 Document in Chart   Glucose, capillary     Status: Abnormal   Collection Time: 03/01/18  1:10 PM  Result Value Ref Range   Glucose-Capillary 119 (H) 70 - 99 mg/dL   Comment 1 Notify RN    Comment 2 Document in Chart   Glucose, capillary     Status: Abnormal   Collection Time: 03/01/18  5:20 PM  Result Value Ref Range   Glucose-Capillary 192 (H) 70 - 99 mg/dL  Glucose, capillary     Status: Abnormal   Collection Time: 03/01/18  8:58 PM  Result Value Ref Range   Glucose-Capillary 152 (H) 70 - 99 mg/dL   Comment 1 Notify RN   Comprehensive metabolic panel     Status: Abnormal   Collection Time: 03/02/18  3:57 AM  Result Value Ref Range   Sodium 140 135 - 145 mmol/L   Potassium 4.1 3.5 - 5.1 mmol/L   Chloride 95 (L) 98 - 111 mmol/L   CO2 36 (H) 22 - 32 mmol/L   Glucose,  Bld 140 (H) 70 - 99 mg/dL   BUN 67 (H) 8 - 23 mg/dL   Creatinine, Ser 1.98 (H) 0.44 - 1.00 mg/dL   Calcium 8.5 (L) 8.9 - 10.3 mg/dL   Total Protein 6.1 (L) 6.5 - 8.1 g/dL   Albumin 3.2 (L) 3.5 - 5.0 g/dL   AST 14 (L) 15 - 41 U/L   ALT 22 0 - 44 U/L   Alkaline Phosphatase 47 38 - 126 U/L   Total Bilirubin 0.5 0.3 - 1.2 mg/dL   GFR calc non Af Amer 26 (L) >60 mL/min   GFR calc Af Amer 30 (L) >60 mL/min    Comment: (NOTE) The eGFR has been calculated using the CKD EPI equation. This calculation has not been validated in all clinical situations. eGFR's persistently <60 mL/min signify possible Chronic Kidney Disease.     Anion gap 9 5 - 15    Comment: Performed at [facility], [place] [address] [place], [place] [address]  CBC with Differential/Platelet     Status: Abnormal   Collection Time: 03/02/18  3:57 AM  Result Value Ref Range   WBC 5.5 4.0 - 10.5 K/uL   RBC 3.83 (L) 3.87 - 5.11 MIL/uL   Hemoglobin 9.6 (L) 12.0 - 15.0 g/dL   HCT 33.8 (L) 36.0 - 46.0 %   MCV 88.3 80.0 - 100.0 fL   MCH 25.1 (L) 26.0 - 34.0 pg   MCHC 28.4 (L) 30.0 - 36.0 g/dL   RDW 16.8 (H) 11.5 - 15.5 %   Platelets 79 (L) 150 - 400 K/uL    Comment: Immature Platelet Fraction may be clinically indicated, consider ordering this additional test [id number]    nRBC 0.0 0.0 - 0.2 %   Neutrophils Relative % 78 %   Neutro Abs 4.4 1.7 - 7.7 K/uL   Lymphocytes Relative 11 %   Lymphs Abs 0.6 (L) 0.7 - 4.0 K/uL   Monocytes Relative 8 %   Monocytes Absolute 0.4 0.1 - 1.0 K/uL   Eosinophils Relative 0 %   Eosinophils Absolute 0.0 0.0 - 0.5 K/uL   Basophils Relative 0 %   Basophils Absolute 0.0 0.0 - 0.1 K/uL   Immature Granulocytes 3 %   Abs Immature Granulocytes 0.15 (H) 0.00 - 0.07 K/uL    Comment: Performed at [facility], [place] [address] [place], [place] [address]  Glucose, capillary     Status: Abnormal   Collection Time: 03/02/18  7:28 AM  Result Value Ref Range   Glucose-Capillary 100 (H) 70 - 99 mg/dL  Glucose, capillary     Status: Abnormal   Collection Time: 03/02/18 11:35 AM  Result Value Ref Range   Glucose-Capillary 163 (H) 70 - 99 mg/dL   Comment 1 Notify RN    Comment 2 Document in Chart   Glucose, capillary     Status: Abnormal   Collection Time: 03/02/18  4:40 PM  Result Value Ref Range   Glucose-Capillary 237 (H) 70 - 99 mg/dL   Comment 1 Notify RN    Comment 2 Document in Chart     Assessment/Plan: 1. COPD exacerbation ([place]) - ipratropium-albuterol (DUONEB) 0.5-2.5 (3) MG/3ML nebulizer solution 3 mL - DG Chest 2 View; Future 2. Community acquired pneumonia of right  lower lobe of lung ([place]) - CBC w/Diff  Duoneb given in office with improvement. O2 saturation is good at rest. Will need to increase slightly to 3.5 L/min with ambulation to keep O2 at 90%. Recommend  STAt CXR today to reassess pneumonia. She is euvolemic on exam today so do not feel this is contributing. She has been instructed to call Pulmonology today to schedule hospital follow-up. ER for any worsening symptoms.  3. Acute renal failure superimposed on stage 4 chronic kidney disease, unspecified acute renal failure type ([place]) Repeat labs today to further assess. She is to call Nephrology as she is overdue for follow-up.  - CBC w/Diff - Comp Met (CMET)   [name], PA-C

## 2018-03-20 ENCOUNTER — Telehealth: Payer: Self-pay | Admitting: Internal Medicine

## 2018-03-20 ENCOUNTER — Ambulatory Visit: Payer: Medicare Other | Admitting: Physician Assistant

## 2018-03-20 NOTE — Telephone Encounter (Signed)
lmtcb for pt to make aware.

## 2018-03-20 NOTE — Telephone Encounter (Signed)
No additional recs.

## 2018-03-20 NOTE — Telephone Encounter (Signed)
Patient has been recently been in the hospital on 02/24/18. She states the that she was on 2 to 3l of oxygen at that time she has recently not been  able to stay above 88% oxygen while on 4l of o2. She was wheezing at this time. I asked that she come in for a appointment today but she declined due to  Not having a ride her. I have made her a appointment for 03/21/18 at [facility] with Aaron Edelman NP. I did advise if her oxygen drop to turn the level up to 5 to maintain above 88%. If she gets any worse to go to the emergency room.  She also states she is taking her albuterol neb 3-4 times a day.  Dr. Melvyn Novas Please advise of any other rec's

## 2018-03-20 NOTE — Telephone Encounter (Signed)
Patient is aware and nothing further is needed at this time. Made aware if she gets worse she needs to go to the hospital.

## 2018-03-21 ENCOUNTER — Inpatient Hospital Stay: Payer: Medicare Other | Admitting: Pulmonary Disease

## 2018-03-21 ENCOUNTER — Ambulatory Visit (INDEPENDENT_AMBULATORY_CARE_PROVIDER_SITE_OTHER)
Admission: RE | Admit: 2018-03-21 | Discharge: 2018-03-21 | Disposition: A | Discharge status: Home / Self Care | Payer: Medicare Other | Source: Ambulatory Visit | Attending: Adult Health | Admitting: Adult Health

## 2018-03-21 ENCOUNTER — Ambulatory Visit (INDEPENDENT_AMBULATORY_CARE_PROVIDER_SITE_OTHER): Payer: Medicare Other | Admitting: Adult Health

## 2018-03-21 ENCOUNTER — Encounter: Payer: Self-pay | Admitting: Adult Health

## 2018-03-21 VITALS — BP 112/70 | HR 91 | Ht 64.0 in | Wt 279.0 lb

## 2018-03-21 DIAGNOSIS — J441 Chronic obstructive pulmonary disease with (acute) exacerbation: Secondary | ICD-10-CM | POA: Diagnosis not present

## 2018-03-21 DIAGNOSIS — R05 Cough: Secondary | ICD-10-CM | POA: Diagnosis not present

## 2018-03-21 DIAGNOSIS — G4733 Obstructive sleep apnea (adult) (pediatric): Secondary | ICD-10-CM | POA: Diagnosis not present

## 2018-03-21 DIAGNOSIS — R0602 Shortness of breath: Secondary | ICD-10-CM | POA: Diagnosis not present

## 2018-03-21 DIAGNOSIS — I2781 Cor pulmonale (chronic): Secondary | ICD-10-CM

## 2018-03-21 DIAGNOSIS — R911 Solitary pulmonary nodule: Secondary | ICD-10-CM

## 2018-03-21 DIAGNOSIS — J9611 Chronic respiratory failure with hypoxia: Secondary | ICD-10-CM | POA: Diagnosis not present

## 2018-03-21 DIAGNOSIS — J9612 Chronic respiratory failure with hypercapnia: Secondary | ICD-10-CM

## 2018-03-21 DIAGNOSIS — J90 Pleural effusion, not elsewhere classified: Secondary | ICD-10-CM

## 2018-03-21 DIAGNOSIS — J189 Pneumonia, unspecified organism: Secondary | ICD-10-CM | POA: Insufficient documentation

## 2018-03-21 MED ORDER — IPRATROPIUM-ALBUTEROL 0.5-2.5 (3) MG/3ML IN SOLN: 3.0000 mL | Freq: Four times a day (QID) | RESPIRATORY_TRACT | 5 refills | Status: AC

## 2018-03-21 MED ORDER — PREDNISONE 10 MG PO TABS: ORAL_TABLET | ORAL | 0 refills | Status: DC

## 2018-03-21 NOTE — Patient Instructions (Addendum)
Continue on Symbicort 2 puffs Twice daily  , rinse after use.  Increase Duoneb Four times a day  .  Restart CPAP At bedtime  And with naps.  Continue on oxygen 4l/m . Goal is to keep O2 sats 88-92%.  Keep daily weights.  Lasix 20mg  daily for 2 days .  Low salt diet  Begin Prednisone 20mg  daily for 5 days , 10mg  daily for 5 days then stop .  Mucinex DM Twice daily  As needed  Cough/congestion .  CT scan in 6 weeks and As needed   Follow up Kidney doctor as discussed  Follow up with Dr. Melvyn Novas in 6 weeks and As needed   Please contact office for sooner follow up if symptoms do not improve or worsen or seek emergency care
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
## 2018-03-21 NOTE — Assessment & Plan Note (Signed)
Patient education on importance of CPAP and potential complications of untreated sleep apnea. He is to restart CPAP at bedtime.  And to use with sleep and at naps.

## 2018-03-21 NOTE — Assessment & Plan Note (Signed)
Cor pulmonale and diastolic heart failure.  Patient weight has trended up quite a bit since stopping Lasix.  She does have chronic kidney disease.  We will have her to take Lasix over the next 2 days.  Watch a low-salt diet and keep legs elevated. She is to follow-up with nephrology.

## 2018-03-21 NOTE — Assessment & Plan Note (Signed)
Recent COPD exacerbation with associated pneumonia slowly resolving.  Patient is having slow clinical improvement.  Having increased symptoms since tapering off of prednisone.  Will start back on a low-dose of prednisone over the next 10 days to slow taper to off.  Plan  Patient Instructions  Continue on Symbicort 2 puffs Twice daily  , rinse after use.  Increase Duoneb Four times a day  .  Restart CPAP At bedtime  And with naps.  Continue on oxygen 4l/m . Goal is to keep O2 sats 88-92%.  Keep daily weights.  Lasix 20mg  daily for 2 days .  Low salt diet  Begin Prednisone 20mg  daily for 5 days , 10mg  daily for 5 days then stop .  Mucinex DM Twice daily  As needed  Cough/congestion .  CT scan in 6 weeks and As needed   Follow up Kidney doctor as discussed  Follow up with Dr. Melvyn Novas in 6 weeks and As needed   Please contact office for sooner follow up if symptoms do not improve or worsen or seek emergency care

## 2018-03-21 NOTE — Assessment & Plan Note (Signed)
Continue on O2 ,  O2 sat  Goal is 88-92%

## 2018-03-21 NOTE — Assessment & Plan Note (Signed)
PNA with abnormal CT chest  Clinically improving slowly , CXR shows improved aeration  Will need CT chest to make sure area on CT is resolving .

## 2018-03-23 DIAGNOSIS — M47812 Spondylosis without myelopathy or radiculopathy, cervical region: Secondary | ICD-10-CM | POA: Diagnosis not present

## 2018-03-23 DIAGNOSIS — Z79899 Other long term (current) drug therapy: Secondary | ICD-10-CM | POA: Diagnosis not present

## 2018-03-23 DIAGNOSIS — R5381 Other malaise: Secondary | ICD-10-CM | POA: Diagnosis not present

## 2018-03-23 DIAGNOSIS — M5136 Other intervertebral disc degeneration, lumbar region: Secondary | ICD-10-CM | POA: Diagnosis not present

## 2018-03-23 DIAGNOSIS — W19XXXA Unspecified fall, initial encounter: Secondary | ICD-10-CM | POA: Diagnosis not present

## 2018-03-23 DIAGNOSIS — M47817 Spondylosis without myelopathy or radiculopathy, lumbosacral region: Secondary | ICD-10-CM | POA: Diagnosis not present

## 2018-03-23 DIAGNOSIS — T1490XA Injury, unspecified, initial encounter: Secondary | ICD-10-CM | POA: Diagnosis not present

## 2018-03-23 DIAGNOSIS — G894 Chronic pain syndrome: Secondary | ICD-10-CM | POA: Diagnosis not present

## 2018-03-23 DIAGNOSIS — Z79891 Long term (current) use of opiate analgesic: Secondary | ICD-10-CM | POA: Diagnosis not present

## 2018-03-28 ENCOUNTER — Telehealth: Payer: Self-pay | Admitting: Physician Assistant

## 2018-03-28 MED ORDER — VARENICLINE TARTRATE 0.5 MG X 11 & 1 MG X 42 PO MISC: ORAL | 0 refills | Status: DC

## 2018-03-28 NOTE — Telephone Encounter (Signed)
Copied from [place] [id number]. Topic: General - Inquiry >> Mar 28, 2018  2:32 PM [name] wrote: Reason for CRM: Pt states at [place] 03/14/18 her and [name] discussed refill on Chantix but it has not been sent in for her. She is requesting a call back to discuss the reason it has not been sent. Please advise.

## 2018-03-28 NOTE — Telephone Encounter (Signed)
Medication has been sent in. My apologies.

## 2018-03-30 NOTE — Progress Notes (Signed)
Chart and office note reviewed in detail  > agree with a/p as outlined    

## 2018-04-02 DIAGNOSIS — J449 Chronic obstructive pulmonary disease, unspecified: Secondary | ICD-10-CM | POA: Diagnosis not present

## 2018-04-03 ENCOUNTER — Other Ambulatory Visit (HOSPITAL_BASED_OUTPATIENT_CLINIC_OR_DEPARTMENT_OTHER): Payer: Medicare Other

## 2018-04-07 ENCOUNTER — Other Ambulatory Visit: Payer: Self-pay

## 2018-04-07 ENCOUNTER — Encounter (HOSPITAL_COMMUNITY): Payer: Self-pay | Admitting: Emergency Medicine

## 2018-04-07 ENCOUNTER — Emergency Department (HOSPITAL_COMMUNITY): Payer: Medicare Other

## 2018-04-07 ENCOUNTER — Inpatient Hospital Stay (HOSPITAL_COMMUNITY)
Admission: EM | Admit: 2018-04-07 | Discharge: 2018-04-11 | DRG: 291 | Disposition: A | Discharge status: Home Health | Payer: Medicare Other | Attending: Internal Medicine | Admitting: Internal Medicine

## 2018-04-07 DIAGNOSIS — Z888 Allergy status to other drugs, medicaments and biological substances status: Secondary | ICD-10-CM

## 2018-04-07 DIAGNOSIS — N183 Chronic kidney disease, stage 3 unspecified: Secondary | ICD-10-CM | POA: Diagnosis present

## 2018-04-07 DIAGNOSIS — G4733 Obstructive sleep apnea (adult) (pediatric): Secondary | ICD-10-CM | POA: Diagnosis not present

## 2018-04-07 DIAGNOSIS — J9622 Acute and chronic respiratory failure with hypercapnia: Secondary | ICD-10-CM | POA: Diagnosis not present

## 2018-04-07 DIAGNOSIS — Z801 Family history of malignant neoplasm of trachea, bronchus and lung: Secondary | ICD-10-CM

## 2018-04-07 DIAGNOSIS — J9621 Acute and chronic respiratory failure with hypoxia: Secondary | ICD-10-CM | POA: Diagnosis not present

## 2018-04-07 DIAGNOSIS — Z905 Acquired absence of kidney: Secondary | ICD-10-CM | POA: Diagnosis not present

## 2018-04-07 DIAGNOSIS — Z87891 Personal history of nicotine dependence: Secondary | ICD-10-CM | POA: Diagnosis not present

## 2018-04-07 DIAGNOSIS — Z6841 Body Mass Index (BMI) 40.0 and over, adult: Secondary | ICD-10-CM

## 2018-04-07 DIAGNOSIS — Z825 Family history of asthma and other chronic lower respiratory diseases: Secondary | ICD-10-CM

## 2018-04-07 DIAGNOSIS — R0902 Hypoxemia: Secondary | ICD-10-CM | POA: Diagnosis not present

## 2018-04-07 DIAGNOSIS — I5033 Acute on chronic diastolic (congestive) heart failure: Secondary | ICD-10-CM | POA: Diagnosis not present

## 2018-04-07 DIAGNOSIS — M1990 Unspecified osteoarthritis, unspecified site: Secondary | ICD-10-CM | POA: Diagnosis not present

## 2018-04-07 DIAGNOSIS — Z85528 Personal history of other malignant neoplasm of kidney: Secondary | ICD-10-CM

## 2018-04-07 DIAGNOSIS — G8929 Other chronic pain: Secondary | ICD-10-CM

## 2018-04-07 DIAGNOSIS — I130 Hypertensive heart and chronic kidney disease with heart failure and stage 1 through stage 4 chronic kidney disease, or unspecified chronic kidney disease: Secondary | ICD-10-CM | POA: Diagnosis not present

## 2018-04-07 DIAGNOSIS — Z794 Long term (current) use of insulin: Secondary | ICD-10-CM

## 2018-04-07 DIAGNOSIS — R609 Edema, unspecified: Secondary | ICD-10-CM | POA: Diagnosis not present

## 2018-04-07 DIAGNOSIS — J449 Chronic obstructive pulmonary disease, unspecified: Secondary | ICD-10-CM | POA: Diagnosis not present

## 2018-04-07 DIAGNOSIS — J441 Chronic obstructive pulmonary disease with (acute) exacerbation: Secondary | ICD-10-CM | POA: Diagnosis not present

## 2018-04-07 DIAGNOSIS — I10 Essential (primary) hypertension: Secondary | ICD-10-CM

## 2018-04-07 DIAGNOSIS — E039 Hypothyroidism, unspecified: Secondary | ICD-10-CM | POA: Diagnosis present

## 2018-04-07 DIAGNOSIS — Z79890 Hormone replacement therapy (postmenopausal): Secondary | ICD-10-CM

## 2018-04-07 DIAGNOSIS — M797 Fibromyalgia: Secondary | ICD-10-CM | POA: Diagnosis present

## 2018-04-07 DIAGNOSIS — I509 Heart failure, unspecified: Secondary | ICD-10-CM

## 2018-04-07 DIAGNOSIS — Z8619 Personal history of other infectious and parasitic diseases: Secondary | ICD-10-CM

## 2018-04-07 DIAGNOSIS — Z803 Family history of malignant neoplasm of breast: Secondary | ICD-10-CM

## 2018-04-07 DIAGNOSIS — Z7951 Long term (current) use of inhaled steroids: Secondary | ICD-10-CM

## 2018-04-07 DIAGNOSIS — G894 Chronic pain syndrome: Secondary | ICD-10-CM | POA: Diagnosis not present

## 2018-04-07 DIAGNOSIS — Z811 Family history of alcohol abuse and dependence: Secondary | ICD-10-CM

## 2018-04-07 DIAGNOSIS — E1122 Type 2 diabetes mellitus with diabetic chronic kidney disease: Secondary | ICD-10-CM | POA: Diagnosis present

## 2018-04-07 DIAGNOSIS — E1143 Type 2 diabetes mellitus with diabetic autonomic (poly)neuropathy: Secondary | ICD-10-CM | POA: Diagnosis present

## 2018-04-07 DIAGNOSIS — G2581 Restless legs syndrome: Secondary | ICD-10-CM | POA: Diagnosis present

## 2018-04-07 DIAGNOSIS — Z79891 Long term (current) use of opiate analgesic: Secondary | ICD-10-CM

## 2018-04-07 DIAGNOSIS — Z9049 Acquired absence of other specified parts of digestive tract: Secondary | ICD-10-CM | POA: Diagnosis not present

## 2018-04-07 DIAGNOSIS — D696 Thrombocytopenia, unspecified: Secondary | ICD-10-CM | POA: Diagnosis present

## 2018-04-07 DIAGNOSIS — R9431 Abnormal electrocardiogram [ECG] [EKG]: Secondary | ICD-10-CM | POA: Diagnosis not present

## 2018-04-07 DIAGNOSIS — Z833 Family history of diabetes mellitus: Secondary | ICD-10-CM

## 2018-04-07 DIAGNOSIS — Z90710 Acquired absence of both cervix and uterus: Secondary | ICD-10-CM | POA: Diagnosis not present

## 2018-04-07 DIAGNOSIS — Z808 Family history of malignant neoplasm of other organs or systems: Secondary | ICD-10-CM

## 2018-04-07 DIAGNOSIS — I2781 Cor pulmonale (chronic): Secondary | ICD-10-CM | POA: Diagnosis present

## 2018-04-07 DIAGNOSIS — R0602 Shortness of breath: Secondary | ICD-10-CM | POA: Diagnosis not present

## 2018-04-07 DIAGNOSIS — E119 Type 2 diabetes mellitus without complications: Secondary | ICD-10-CM | POA: Diagnosis not present

## 2018-04-07 DIAGNOSIS — Z743 Need for continuous supervision: Secondary | ICD-10-CM | POA: Diagnosis not present

## 2018-04-07 DIAGNOSIS — E1129 Type 2 diabetes mellitus with other diabetic kidney complication: Secondary | ICD-10-CM

## 2018-04-07 DIAGNOSIS — R000 Tachycardia, unspecified: Secondary | ICD-10-CM | POA: Diagnosis not present

## 2018-04-07 DIAGNOSIS — M79606 Pain in leg, unspecified: Secondary | ICD-10-CM

## 2018-04-07 DIAGNOSIS — R0689 Other abnormalities of breathing: Secondary | ICD-10-CM | POA: Diagnosis not present

## 2018-04-07 DIAGNOSIS — Z8249 Family history of ischemic heart disease and other diseases of the circulatory system: Secondary | ICD-10-CM | POA: Diagnosis not present

## 2018-04-07 DIAGNOSIS — R600 Localized edema: Secondary | ICD-10-CM | POA: Diagnosis not present

## 2018-04-07 DIAGNOSIS — E785 Hyperlipidemia, unspecified: Secondary | ICD-10-CM | POA: Diagnosis present

## 2018-04-07 DIAGNOSIS — I110 Hypertensive heart disease with heart failure: Secondary | ICD-10-CM | POA: Diagnosis not present

## 2018-04-07 HISTORY — DX: Heart failure, unspecified: I50.9

## 2018-04-07 LAB — URINALYSIS, ROUTINE W REFLEX MICROSCOPIC
Bilirubin Urine: NEGATIVE
GLUCOSE, UA: NEGATIVE mg/dL
KETONES UR: NEGATIVE mg/dL
LEUKOCYTES UA: NEGATIVE
Nitrite: NEGATIVE
PROTEIN: 30 mg/dL — AB
Specific Gravity, Urine: 1.015 (ref 1.005–1.030)
pH: 6 (ref 5.0–8.0)

## 2018-04-07 LAB — CBC WITH DIFFERENTIAL/PLATELET
ABS IMMATURE GRANULOCYTES: 0.02 10*3/uL (ref 0.00–0.07)
BASOS PCT: 0 %
Basophils Absolute: 0 10*3/uL (ref 0.0–0.1)
EOS ABS: 0 10*3/uL (ref 0.0–0.5)
Eosinophils Relative: 0 %
HEMATOCRIT: 35.9 % — AB (ref 36.0–46.0)
Hemoglobin: 9.6 g/dL — ABNORMAL LOW (ref 12.0–15.0)
IMMATURE GRANULOCYTES: 0 %
LYMPHS ABS: 0.4 10*3/uL — AB (ref 0.7–4.0)
Lymphocytes Relative: 8 %
MCH: 23.9 pg — AB (ref 26.0–34.0)
MCHC: 26.7 g/dL — ABNORMAL LOW (ref 30.0–36.0)
MCV: 89.3 fL (ref 80.0–100.0)
MONO ABS: 0.4 10*3/uL (ref 0.1–1.0)
MONOS PCT: 9 %
Neutro Abs: 3.7 10*3/uL (ref 1.7–7.7)
Neutrophils Relative %: 83 %
PLATELETS: 49 10*3/uL — AB (ref 150–400)
RBC: 4.02 MIL/uL (ref 3.87–5.11)
RDW: 17.4 % — AB (ref 11.5–15.5)
WBC: 4.5 10*3/uL (ref 4.0–10.5)
nRBC: 0 % (ref 0.0–0.2)

## 2018-04-07 LAB — CBG MONITORING, ED: GLUCOSE-CAPILLARY: 140 mg/dL — AB (ref 70–99)

## 2018-04-07 LAB — GLUCOSE, CAPILLARY: GLUCOSE-CAPILLARY: 128 mg/dL — AB (ref 70–99)

## 2018-04-07 LAB — COMPREHENSIVE METABOLIC PANEL
ALBUMIN: 3.1 g/dL — AB (ref 3.5–5.0)
ALT: 10 U/L (ref 0–44)
AST: 13 U/L — AB (ref 15–41)
Alkaline Phosphatase: 46 U/L (ref 38–126)
Anion gap: 7 (ref 5–15)
BUN: 16 mg/dL (ref 8–23)
CALCIUM: 8.2 mg/dL — AB (ref 8.9–10.3)
CO2: 38 mmol/L — ABNORMAL HIGH (ref 22–32)
CREATININE: 1.38 mg/dL — AB (ref 0.44–1.00)
Chloride: 93 mmol/L — ABNORMAL LOW (ref 98–111)
GFR calc Af Amer: 46 mL/min — ABNORMAL LOW (ref >60)
GFR, EST NON AFRICAN AMERICAN: 39 mL/min — AB (ref >60)
Glucose, Bld: 156 mg/dL — ABNORMAL HIGH (ref 70–99)
POTASSIUM: 4.4 mmol/L (ref 3.5–5.1)
Sodium: 138 mmol/L (ref 135–145)
Total Bilirubin: 0.8 mg/dL (ref 0.3–1.2)
Total Protein: 5.8 g/dL — ABNORMAL LOW (ref 6.5–8.1)

## 2018-04-07 LAB — BRAIN NATRIURETIC PEPTIDE: B NATRIURETIC PEPTIDE 5: 597 pg/mL — AB (ref 0.0–100.0)

## 2018-04-07 LAB — TROPONIN I: TROPONIN I: 0.03 ng/mL — AB (ref <0.03)

## 2018-04-07 MED ORDER — MORPHINE SULFATE ER 30 MG PO TBCR
30.0000 mg | EXTENDED_RELEASE_TABLET | Freq: Two times a day (BID) | ORAL | Status: DC
  Administered 2018-04-07 – 2018-04-11 (×8): 30 mg via ORAL
  Filled 2018-04-07 (×8): qty 1

## 2018-04-07 MED ORDER — INSULIN ASPART 100 UNIT/ML ~~LOC~~ SOLN
0.0000 [IU] | Freq: Every day | SUBCUTANEOUS | Status: DC
  Administered 2018-04-08: 3 [IU] via SUBCUTANEOUS
  Administered 2018-04-09: 2 [IU] via SUBCUTANEOUS

## 2018-04-07 MED ORDER — SODIUM CHLORIDE 0.9% FLUSH
3.0000 mL | Freq: Two times a day (BID) | INTRAVENOUS | Status: DC
  Administered 2018-04-08 – 2018-04-11 (×6): 3 mL via INTRAVENOUS

## 2018-04-07 MED ORDER — INSULIN ASPART 100 UNIT/ML ~~LOC~~ SOLN
0.0000 [IU] | Freq: Three times a day (TID) | SUBCUTANEOUS | Status: DC
  Administered 2018-04-08: 7 [IU] via SUBCUTANEOUS
  Administered 2018-04-08: 3 [IU] via SUBCUTANEOUS
  Administered 2018-04-09: 7 [IU] via SUBCUTANEOUS
  Administered 2018-04-09: 11 [IU] via SUBCUTANEOUS
  Administered 2018-04-09: 7 [IU] via SUBCUTANEOUS
  Administered 2018-04-10 (×2): 4 [IU] via SUBCUTANEOUS
  Administered 2018-04-10: 7 [IU] via SUBCUTANEOUS
  Administered 2018-04-11: 4 [IU] via SUBCUTANEOUS
  Administered 2018-04-11: 7 [IU] via SUBCUTANEOUS

## 2018-04-07 MED ORDER — SODIUM CHLORIDE 0.9% FLUSH: 3.0000 mL | INTRAVENOUS | Status: DC | PRN

## 2018-04-07 MED ORDER — TIZANIDINE HCL 2 MG PO TABS: 2.0000 mg | ORAL_TABLET | Freq: Two times a day (BID) | ORAL | Status: DC | PRN

## 2018-04-07 MED ORDER — SENNOSIDES-DOCUSATE SODIUM 8.6-50 MG PO TABS
1.0000 | ORAL_TABLET | Freq: Every day | ORAL | Status: DC
  Administered 2018-04-08 – 2018-04-10 (×3): 1 via ORAL
  Filled 2018-04-07 (×4): qty 1

## 2018-04-07 MED ORDER — INSULIN GLARGINE 100 UNIT/ML ~~LOC~~ SOLN
30.0000 [IU] | SUBCUTANEOUS | Status: DC
  Filled 2018-04-07 (×3): qty 0.3

## 2018-04-07 MED ORDER — FUROSEMIDE 10 MG/ML IJ SOLN
20.0000 mg | Freq: Once | INTRAMUSCULAR | Status: AC
  Administered 2018-04-07: 20 mg via INTRAVENOUS
  Filled 2018-04-07: qty 2

## 2018-04-07 MED ORDER — NORTRIPTYLINE HCL 25 MG PO CAPS
25.0000 mg | ORAL_CAPSULE | Freq: Every day | ORAL | Status: DC
  Administered 2018-04-07 – 2018-04-10 (×4): 25 mg via ORAL
  Filled 2018-04-07 (×4): qty 1

## 2018-04-07 MED ORDER — PRAVASTATIN SODIUM 10 MG PO TABS
20.0000 mg | ORAL_TABLET | Freq: Every day | ORAL | Status: DC
  Administered 2018-04-08 – 2018-04-11 (×4): 20 mg via ORAL
  Filled 2018-04-07 (×4): qty 2

## 2018-04-07 MED ORDER — GUAIFENESIN ER 600 MG PO TB12
600.0000 mg | ORAL_TABLET | Freq: Two times a day (BID) | ORAL | Status: DC
  Administered 2018-04-07 – 2018-04-11 (×8): 600 mg via ORAL
  Filled 2018-04-07 (×8): qty 1

## 2018-04-07 MED ORDER — LEVOTHYROXINE SODIUM 88 MCG PO TABS
88.0000 ug | ORAL_TABLET | Freq: Every day | ORAL | Status: DC
  Administered 2018-04-09 – 2018-04-11 (×3): 88 ug via ORAL
  Filled 2018-04-07 (×3): qty 1

## 2018-04-07 MED ORDER — FUROSEMIDE 10 MG/ML IJ SOLN
40.0000 mg | Freq: Two times a day (BID) | INTRAMUSCULAR | Status: DC
  Filled 2018-04-07: qty 4

## 2018-04-07 MED ORDER — POTASSIUM CHLORIDE CRYS ER 20 MEQ PO TBCR
20.0000 meq | EXTENDED_RELEASE_TABLET | Freq: Two times a day (BID) | ORAL | Status: DC
  Administered 2018-04-07 – 2018-04-09 (×5): 20 meq via ORAL
  Filled 2018-04-07 (×6): qty 1

## 2018-04-07 MED ORDER — NYSTATIN 100000 UNIT/GM EX POWD
Freq: Three times a day (TID) | CUTANEOUS | Status: DC
  Administered 2018-04-07 – 2018-04-11 (×8): via TOPICAL
  Filled 2018-04-07: qty 15

## 2018-04-07 MED ORDER — FUROSEMIDE 10 MG/ML IJ SOLN: 40.0000 mg | Freq: Once | INTRAMUSCULAR | Status: DC

## 2018-04-07 MED ORDER — ONDANSETRON HCL 4 MG/2ML IJ SOLN: 4.0000 mg | Freq: Four times a day (QID) | INTRAMUSCULAR | Status: DC | PRN

## 2018-04-07 MED ORDER — IPRATROPIUM-ALBUTEROL 0.5-2.5 (3) MG/3ML IN SOLN
3.0000 mL | Freq: Once | RESPIRATORY_TRACT | Status: AC
  Administered 2018-04-07: 3 mL via RESPIRATORY_TRACT
  Filled 2018-04-07: qty 3

## 2018-04-07 MED ORDER — IPRATROPIUM-ALBUTEROL 0.5-2.5 (3) MG/3ML IN SOLN
3.0000 mL | Freq: Three times a day (TID) | RESPIRATORY_TRACT | Status: DC
  Filled 2018-04-07: qty 3

## 2018-04-07 MED ORDER — SODIUM CHLORIDE 0.9 % IV SOLN: 250.0000 mL | INTRAVENOUS | Status: DC | PRN

## 2018-04-07 MED ORDER — ALBUTEROL SULFATE (2.5 MG/3ML) 0.083% IN NEBU: 3.0000 mL | INHALATION_SOLUTION | Freq: Four times a day (QID) | RESPIRATORY_TRACT | Status: DC | PRN

## 2018-04-07 MED ORDER — METOCLOPRAMIDE HCL 10 MG PO TABS
10.0000 mg | ORAL_TABLET | Freq: Two times a day (BID) | ORAL | Status: DC
  Administered 2018-04-07 – 2018-04-11 (×8): 10 mg via ORAL
  Filled 2018-04-07 (×8): qty 1

## 2018-04-07 MED ORDER — MOMETASONE FURO-FORMOTEROL FUM 200-5 MCG/ACT IN AERO
2.0000 | INHALATION_SPRAY | Freq: Two times a day (BID) | RESPIRATORY_TRACT | Status: DC
  Filled 2018-04-07 (×2): qty 8.8

## 2018-04-07 MED ORDER — OXYCODONE HCL 5 MG PO TABS: 5.0000 mg | ORAL_TABLET | Freq: Two times a day (BID) | ORAL | Status: DC | PRN

## 2018-04-07 MED ORDER — IPRATROPIUM-ALBUTEROL 0.5-2.5 (3) MG/3ML IN SOLN
3.0000 mL | Freq: Four times a day (QID) | RESPIRATORY_TRACT | Status: DC
  Administered 2018-04-07: 3 mL via RESPIRATORY_TRACT
  Filled 2018-04-07: qty 3

## 2018-04-07 MED ORDER — ACETAMINOPHEN 325 MG PO TABS: 650.0000 mg | ORAL_TABLET | ORAL | Status: DC | PRN

## 2018-04-07 NOTE — ED Provider Notes (Signed)
[facility] EMERGENCY DEPARTMENT Provider Note   CSN: [id number] Arrival date & time: 04/07/18  1609     History   Chief Complaint Chief Complaint  Patient presents with  . Shortness of Breath  . Fall    HPI Renee Noble is a 62 y.o. female.  HPI  Pt was seen at 1620. Per pt, c/o gradual onset and worsening of persistent SOB for the past 1 week. Pt states she has "gained 20 lbs in the past week." SOB worsens on exertion. Pt states she was taken off her diuretics last month because of elevated kidney function testing. Pt has not f/u with PMD or Renal MD for this. Denies CP/palpitations, no cough, no fevers, no abd pain, no N/V/D, no back pain.   Past Medical History:  Diagnosis Date  . Abdominal mass of other site   . Cervical compression fracture ([place])   . CHF (congestive heart failure) ([place])   . Chronic kidney disease, stage 3 (HCC)    Borderline Stage 2-3  . Chronic lower limb pain   . COPD (chronic obstructive pulmonary disease) ([place])   . CTS (carpal tunnel syndrome)   . Depression   . Diabetes ([place])    Type II  . Fibromyalgia   . Hepatitis C    cured last year (2018)  . Hypercholesteremia   . Hypertension   . Hypothyroidism   . Morbid obesity ([place])   . Neuropathy    Diabetes  . OSA (obstructive sleep apnea)   . Osteoarthritis   . Renal cancer ([place])    Left Kidney Removed  . RLS (restless legs syndrome)   . Syncope   . Venous stasis     Patient Active Problem List   Diagnosis Date Noted  . PNA (pneumonia) 03/21/2018  . COPD (chronic obstructive pulmonary disease) ([place]) 02/25/2018  . Chronic pain syndrome   . Normocytic anemia 02/24/2018  . Acute on chronic respiratory failure with hypoxia and hypercapnia ([place]) 02/24/2018  . OSA (obstructive sleep apnea) 08/16/2017  . Morbid obesity due to excess calories ([place]) 04/29/2017  . Cor pulmonale (chronic) ([place]) 04/29/2017  . Chronic respiratory failure with hypoxia and hypercapnia ([place]) 04/28/2017  .  Calf swelling 08/25/2016  . CKD (chronic kidney disease), stage III ([place]) 04/14/2016  . Hyperlipidemia 01/16/2016  . Essential hypertension 01/16/2016  . Hypoxia   . Thrombocytopenia ([place]) 07/12/2015  . COPD with acute exacerbation ([place])   . Thyroid activity decreased 06/23/2015  . Encounter for chronic pain management 09/02/2014  . Hernia of abdominal cavity 07/30/2014  . Abnormal CXR 02/15/2014  . COPD GOLDIII with min reversibility  12/16/2013  . History of hepatitis C 12/16/2013  . Breast cancer screening 12/16/2013  . Diabetes mellitus, type II, insulin dependent ([place]) 12/16/2013  . Screening for osteoporosis 12/16/2013    Past Surgical History:  Procedure Laterality Date  . ABDOMINAL HERNIA REPAIR     x2  . ABDOMINAL HYSTERECTOMY    . BLADDER SUSPENSION    . CHOLECYSTECTOMY    . CYST EXCISION     Head  . INCISIONAL HERNIA REPAIR    . KNEE ARTHROSCOPY     Bilateral  . NEPHRECTOMY     Left  . TONSILLECTOMY    . TOOTH EXTRACTION    . UMBILICAL HERNIA REPAIR    . VAGINA SURGERY    . WISDOM TOOTH EXTRACTION       OB History   None      Home Medications  Prior to Admission medications   Medication Sig Start Date End Date Taking? Authorizing Provider  albuterol (PROAIR HFA) 108 (90 Base) MCG/ACT inhaler Inhale 1-2 puffs into the lungs every 6 (six) hours as needed for wheezing or shortness of breath. 01/20/18   Tanda Rockers, MD  budesonide-formoterol (SYMBICORT) 160-4.5 MCG/ACT inhaler Inhale 2 puffs into the lungs 2 (two) times daily. 01/19/18   Tanda Rockers, MD  clotrimazole-betamethasone (LOTRISONE) cream Apply 1 application topically 2 (two) times daily as needed. 09/23/17   Leamon Arnt, MD  EMBEDA 30-1.2 MG CPCR Take 1 capsule by mouth 2 (two) times daily. Take 1 capsule by mouth twice daily 12/08/16   [provider]  guaiFENesin (MUCINEX) 600 MG 12 hr tablet Take 1 tablet (600 mg total) by mouth 2 (two) times daily. 03/02/18   Mariel Aloe,  MD  HUMALOG 100 UNIT/ML injection INJECT 0.15 ML (15 UNITS) INTO THE SKIN 3 TIMES DAILY AS NEEDED FOR HIGH BLOOD SUGAR Patient taking differently: Inject 15 Units into the skin 3 (three) times daily as needed for high blood sugar.  12/29/17   [name], PA-C  insulin glargine (LANTUS) 100 UNIT/ML injection INJECT 0.3MLS (30 UNITS TOTAL) INTO THE SKIN EVERY DAY 01/24/18   [name], PA-C  ipratropium-albuterol (DUONEB) 0.5-2.5 (3) MG/3ML SOLN Take 3 mLs by nebulization 4 (four) times daily. Dx: J44.9 03/21/18   Parrett, Fonnie Mu, NP  levothyroxine (SYNTHROID, LEVOTHROID) 88 MCG tablet Take 1 tablet (88 mcg total) by mouth daily before breakfast. 12/08/17   [name], PA-C  metoCLOPramide (REGLAN) 10 MG tablet Take 1 tablet (10 mg total) by mouth every 6 (six) hours as needed for nausea. 01/17/18   Cirigliano, Vito V, DO  Multiple Vitamins-Minerals (CENTRUM SILVER PO) Take by mouth.    [provider]  nortriptyline (PAMELOR) 25 MG capsule Take 1 capsule (25 mg total) by mouth at bedtime. 03/08/18   [name], PA-C  nortriptyline (PAMELOR) 75 MG capsule TAKE 1 CAPSULE BY MOUTH AT BEDTIME 11/25/17   [name] C, PA-C  nystatin (MYCOSTATIN/NYSTOP) powder APPLY TOPICALLY TWICE DAILY AS NEEDED FOR YEAST INFECTION 09/27/17   [name], PA-C  oxyCODONE (OXY IR/ROXICODONE) 5 MG immediate release tablet Take 5 mg by mouth 2 (two) times daily as needed for breakthrough pain. Take 1 tablet by mouth twice daily as needed 12/09/16   [provider]  OXYGEN O2 2lpm with sleep and 2.5 lpm with exertion  [facility]    [provider]  pravastatin (PRAVACHOL) 20 MG tablet TAKE ONE TABLET BY MOUTH EVERY DAY 10/18/17   [name], PA-C  predniSONE (DELTASONE) 10 MG tablet 2 tabs daily for 5 days and 1 tab daily for 5 days and stop 03/21/18   Parrett, Fonnie Mu, NP  Probiotic Product (PROBIOTIC PO) Take by mouth.    [provider]  senna-docusate  (SENOKOT-S) 8.6-50 MG tablet Take 1 tablet by mouth at bedtime. 03/02/18   Mariel Aloe, MD  SURE COMFORT INSULIN SYRINGE 30G X 1/2" 1 ML MISC USE FOUR (4) TIMES DAILY AS DIRECTED Patient taking differently: Inject 1 Syringe into the skin 4 (four) times daily.  08/31/16   [name], PA-C  tiZANidine (ZANAFLEX) 2 MG tablet Take 2-4 mg by mouth every 12 (twelve) hours as needed for muscle spasms.    [provider]  varenicline (CHANTIX PAK) 0.5 MG X 11 & 1 MG X 42 tablet Take one 0.5  mg tablet by mouth QD x 3 days, then increase to one 0.5 mg tablet BID x 4 days, then increase to one 1 mg tablet BID. 03/28/18   [name], PA-C    Family History Family History  Problem Relation Age of Onset  . Heart attack Mother 1       Deceased  . Heart disease Mother   . Emphysema Mother   . Alcoholism Mother   . COPD Father 85       Deceased  . Emphysema Father   . Alcoholism Father   . Esophageal varices Father   . Alcoholism Paternal Grandfather   . Diabetes Maternal Grandmother   . Heart disease Maternal Grandmother   . Lung cancer Maternal Grandfather   . Emphysema Maternal Grandfather   . Brain cancer Maternal Aunt   . Diabetes Sister   . Heart defect Sister   . Cancer Sister        was told her sister had cancer but beat it and dont know which one   . Heart defect Sister   . Obesity Son   . Breast cancer Maternal Aunt   . Colon cancer Neg Hx   . Esophageal cancer Neg Hx     Social History Social History   Tobacco Use  . Smoking status: Former Smoker    Packs/day: 2.50    Years: 45.00    Pack years: 112.50    Types: Cigarettes    Last attempt to quit: 07/21/2016    Years since quitting: 1.7  . Smokeless tobacco: Never Used  Substance Use Topics  . Alcohol use: No  . Drug use: No     Allergies   Gabapentin; Lyrica [pregabalin]; Ketorolac tromethamine; and Lisinopril   Review of Systems Review of Systems ROS: Statement: All systems negative  except as marked or noted in the HPI; Constitutional: Negative for fever and chills. ; ; Eyes: Negative for eye pain, redness and discharge. ; ; ENMT: Negative for ear pain, hoarseness, nasal congestion, sinus pressure and sore throat. ; ; Cardiovascular: +SOB, weight gain. Negative for chest pain, palpitations, diaphoresis, and peripheral edema. ; ; Respiratory: Negative for cough, wheezing and stridor. ; ; Gastrointestinal: Negative for nausea, vomiting, diarrhea, abdominal pain, blood in stool, hematemesis, jaundice and rectal bleeding. . ; ; Genitourinary: Negative for dysuria, flank pain and hematuria. ; ; Musculoskeletal: Negative for back pain and neck pain. Negative for swelling and trauma.; ; Skin: Negative for pruritus, rash, abrasions, blisters, bruising and skin lesion.; ; Neuro: Negative for headache, lightheadedness and neck stiffness. Negative for weakness, altered level of consciousness, altered mental status, extremity weakness, paresthesias, involuntary movement, seizure and syncope.       Physical Exam Updated Vital Signs BP 121/71   Pulse (!) 105   Temp 98.3 F (36.8 C) (Oral)   Resp 12   Ht 5\' 4"  (1.626 m)   Wt 131.5 kg   SpO2 97%   BMI 49.78 kg/m   Physical Exam 1625: Physical examination:  Nursing notes reviewed; Vital signs and O2 SAT reviewed;  Constitutional: Well developed, Well nourished, Well hydrated, In no acute distress; Head:  Normocephalic, atraumatic; Eyes: EOMI, PERRL, No scleral icterus; ENMT: Mouth and pharynx normal, Mucous membranes moist; Neck: Supple, Full range of motion, No lymphadenopathy; Cardiovascular: Regular rate and rhythm, No gallop; Respiratory: Breath sounds coarse & equal bilaterally, No wheezes.  Speaking full sentences with ease, Normal respiratory effort/excursion; Chest: Nontender, Movement normal; Abdomen: Soft, Nontender, Nondistended, Normal bowel  sounds; Genitourinary: No CVA tenderness; Extremities: Peripheral pulses normal, No  tenderness, +2-3 pedal edema bilat with chronic stasis skin changes. No calf asymmetry.; Neuro: AA&Ox3, Major CN grossly intact.  Speech clear. No gross focal motor or sensory deficits in extremities.; Skin: Color normal, Warm, Dry.   ED Treatments / Results  Labs (all labs ordered are listed, but only abnormal results are displayed)   EKG EKG Interpretation  Date/Time:  Friday April 07 2018 16:19:22 EST Ventricular Rate:  107 PR Interval:    QRS Duration: 101 QT Interval:  337 QTC Calculation: 450 R Axis:   -109 Text Interpretation:  Sinus tachycardia Probable left atrial enlargement Anterior infarct, age indeterminate Baseline wander When compared with ECG of 02/24/2018 No significant change was found Confirmed by Francine Graven [phone]) on 04/07/2018 5:07:52 PM    EKG Interpretation  Date/Time:  Friday April 07 2018 18:00:09 EST Ventricular Rate:  105 PR Interval:    QRS Duration: 106 QT Interval:  335 QTC Calculation: 443 R Axis:   -133 Text Interpretation:  Sinus tachycardia Borderline prolonged PR interval Probable left atrial enlargement Probable anterior infarct, old Since last tracing of earlier today No significant change was found Confirmed by Francine Graven [phone]) on 04/07/2018 6:11:26 PM          Radiology   Procedures Procedures (including critical care time)  Medications Ordered in ED Medications - No data to display   Initial Impression / Assessment and Plan / ED Course  I have reviewed the triage vital signs and the nursing notes.  Pertinent labs & imaging results that were available during my care of the patient were reviewed by me and considered in my medical decision making (see chart for details).  MDM Reviewed: previous chart, nursing note and vitals Reviewed previous: labs and ECG Interpretation: labs, ECG and x-ray    Results for orders placed or performed during the hospital encounter of 04/07/18  Comprehensive  metabolic panel  Result Value Ref Range   Sodium 138 135 - 145 mmol/L   Potassium 4.4 3.5 - 5.1 mmol/L   Chloride 93 (L) 98 - 111 mmol/L   CO2 38 (H) 22 - 32 mmol/L   Glucose, Bld 156 (H) 70 - 99 mg/dL   BUN 16 8 - 23 mg/dL   Creatinine, Ser 1.38 (H) 0.44 - 1.00 mg/dL   Calcium 8.2 (L) 8.9 - 10.3 mg/dL   Total Protein 5.8 (L) 6.5 - 8.1 g/dL   Albumin 3.1 (L) 3.5 - 5.0 g/dL   AST 13 (L) 15 - 41 U/L   ALT 10 0 - 44 U/L   Alkaline Phosphatase 46 38 - 126 U/L   Total Bilirubin 0.8 0.3 - 1.2 mg/dL   GFR calc non Af Amer 39 (L) >60 mL/min   GFR calc Af Amer 46 (L) >60 mL/min   Anion gap 7 5 - 15  Brain natriuretic peptide  Result Value Ref Range   B Natriuretic Peptide 597.0 (H) 0.0 - 100.0 pg/mL  Troponin I - Once  Result Value Ref Range   Troponin I 0.03 (HH) <0.03 ng/mL  CBC with Differential  Result Value Ref Range   WBC 4.5 4.0 - 10.5 K/uL   RBC 4.02 3.87 - 5.11 MIL/uL   Hemoglobin 9.6 (L) 12.0 - 15.0 g/dL   HCT 35.9 (L) 36.0 - 46.0 %   MCV 89.3 80.0 - 100.0 fL   MCH 23.9 (L) 26.0 - 34.0 pg   MCHC 26.7 (L) 30.0 - 36.0  g/dL   RDW 17.4 (H) 11.5 - 15.5 %   Platelets 49 (L) 150 - 400 K/uL   nRBC 0.0 0.0 - 0.2 %   Neutrophils Relative % 83 %   Neutro Abs 3.7 1.7 - 7.7 K/uL   Lymphocytes Relative 8 %   Lymphs Abs 0.4 (L) 0.7 - 4.0 K/uL   Monocytes Relative 9 %   Monocytes Absolute 0.4 0.1 - 1.0 K/uL   Eosinophils Relative 0 %   Eosinophils Absolute 0.0 0.0 - 0.5 K/uL   Basophils Relative 0 %   Basophils Absolute 0.0 0.0 - 0.1 K/uL   Immature Granulocytes 0 %   Abs Immature Granulocytes 0.02 0.00 - 0.07 K/uL  CBG monitoring, ED  Result Value Ref Range   Glucose-Capillary 140 (H) 70 - 99 mg/dL   Dg Chest Portable 1 View Result Date: 04/07/2018 CLINICAL DATA:  Shortness of breath EXAM: PORTABLE CHEST 1 VIEW COMPARISON:  03/21/2018 FINDINGS: Chronic cardiomegaly and interstitial coarsening. There is artifact from EKG leads. Subtle opacity over the right upper lobe likely  correlates with subpleural opacity on 02/27/2018 chest CT-differences from prior related to rotation. Artifact from EKG leads. IMPRESSION: No acute finding when compared to prior. Electronically Signed   By: Monte Fantasia M.D.   On: 04/07/2018 18:27     1850:  Question pt compliance, as she states she has not called to obtain f/u with Renal MD since her hospitalization last month. Pt desat to 70's while on her usual O2 N/C moving from stretcher to [facility]. BNP elevated, but no acute CHF on CXR. IV lasix and short neb given. Dx and testing d/w pt.  Questions answered.  Verb understanding, agreeable to admit.  T/C returned from [facility] Dr. Nehemiah Settle, case discussed, including:  HPI, pertinent PM/SHx, VS/PE, dx testing, ED course and treatment:  Agreeable to admit.     Final Clinical Impressions(s) / ED Diagnoses   Final diagnoses:  None    ED Discharge Orders    None       Francine Graven, DO 04/09/18 [id number]

## 2018-04-07 NOTE — ED Notes (Signed)
Date and time results received: 04/07/18 5:43 PM   Test: troponin Critical Value: 0.03  Name of Provider Notified: Dr. Thurnell Garbe  Orders Received? Or Actions Taken?: see orders

## 2018-04-07 NOTE — H&P (Signed)
History and Physical  EULAH WALKUP KPT:[id number] DOB: [birth year] DOA: 04/07/2018  Referring physician: Dr Thurnell Garbe, ED physician PCP: [name], PA-C  Outpatient Specialists:   Patient Coming From: home  Chief Complaint: SOB  HPI: TOREY REGAN is a 62 y.o. female with a history of diastolic heart failure grade 1 cardiogram 04/2017, COPD, diabetes type 2, 3 of hepatitis C status post treatment mild chronic pain, stage III chronic kidney disease with history of renal cancer status post left nephrectomy, obstructive sleep apnea, chronic cor pulmonale with chronic respiratory failure, COPD.  Patient seen for shortness of breath that has been increasing over the past week with a weight gain of approximately 20 pounds in the past week.  She was admitted approximately 1 month ago for shortness of breath associated with right lower lobe pneumonia and a COPD exacerbation.  At that time, she was taken off of her diuretic.  The patient never followed up with her nephrologist to restart her diuretics.  Emergency Department Course: X-ray trouble for pulmonary edema.  BNP elevated area patient given Lasix.  Review of Systems:   Pt denies any fevers, chills, nausea, vomiting, diarrhea, constipation, abdominal pain, shortness of breath, dyspnea on exertion, orthopnea, cough, wheezing, palpitations, headache, vision changes, lightheadedness, dizziness, melena, rectal bleeding.  Review of systems are otherwise negative  Past Medical History:  Diagnosis Date  . Abdominal mass of other site   . Cervical compression fracture ([place])   . CHF (congestive heart failure) ([place])   . Chronic kidney disease, stage 3 (HCC)    Borderline Stage 2-3  . Chronic lower limb pain   . COPD (chronic obstructive pulmonary disease) ([place])   . CTS (carpal tunnel syndrome)   . Depression   . Diabetes ([place])    Type II  . Fibromyalgia   . Hepatitis C    cured last year (2018)  . Hypercholesteremia   .  Hypertension   . Hypothyroidism   . Morbid obesity ([place])   . Neuropathy    Diabetes  . OSA (obstructive sleep apnea)   . Osteoarthritis   . Renal cancer ([place])    Left Kidney Removed  . RLS (restless legs syndrome)   . Syncope   . Venous stasis    Past Surgical History:  Procedure Laterality Date  . ABDOMINAL HERNIA REPAIR     x2  . ABDOMINAL HYSTERECTOMY    . BLADDER SUSPENSION    . CHOLECYSTECTOMY    . CYST EXCISION     Head  . INCISIONAL HERNIA REPAIR    . KNEE ARTHROSCOPY     Bilateral  . NEPHRECTOMY     Left  . TONSILLECTOMY    . TOOTH EXTRACTION    . UMBILICAL HERNIA REPAIR    . VAGINA SURGERY    . WISDOM TOOTH EXTRACTION     Social History:  reports that she quit smoking about 20 months ago. Her smoking use included cigarettes. She has a 112.50 pack-year smoking history. She has never used smokeless tobacco. She reports that she does not drink alcohol or use drugs. Patient lives at home  Allergies  Allergen Reactions  . Gabapentin Anaphylaxis  . Lyrica [Pregabalin] Shortness Of Breath    Trouble breathing  . Ketorolac Tromethamine Hives  . Lisinopril Cough    Family History  Problem Relation Age of Onset  . Heart attack Mother 13       Deceased  . Heart disease Mother   . Emphysema Mother   .  Alcoholism Mother   . COPD Father 29       Deceased  . Emphysema Father   . Alcoholism Father   . Esophageal varices Father   . Alcoholism Paternal Grandfather   . Diabetes Maternal Grandmother   . Heart disease Maternal Grandmother   . Lung cancer Maternal Grandfather   . Emphysema Maternal Grandfather   . Brain cancer Maternal Aunt   . Diabetes Sister   . Heart defect Sister   . Cancer Sister        was told her sister had cancer but beat it and dont know which one   . Heart defect Sister   . Obesity Son   . Breast cancer Maternal Aunt   . Colon cancer Neg Hx   . Esophageal cancer Neg Hx       Prior to Admission medications   Medication Sig Start  Date End Date Taking? Authorizing Provider  albuterol (PROAIR HFA) 108 (90 Base) MCG/ACT inhaler Inhale 1-2 puffs into the lungs every 6 (six) hours as needed for wheezing or shortness of breath. 01/20/18  Yes Tanda Rockers, MD  budesonide-formoterol (SYMBICORT) 160-4.5 MCG/ACT inhaler Inhale 2 puffs into the lungs 2 (two) times daily. 01/19/18  Yes Tanda Rockers, MD  clotrimazole-betamethasone (LOTRISONE) cream Apply 1 application topically 2 (two) times daily as needed. 09/23/17  Yes Leamon Arnt, MD  ipratropium-albuterol (DUONEB) 0.5-2.5 (3) MG/3ML SOLN Take 3 mLs by nebulization 4 (four) times daily. Dx: J44.9 03/21/18  Yes Parrett, [name] S, NP  morphine (MS CONTIN) 30 MG 12 hr tablet Take 30 mg by mouth every 12 (twelve) hours. 03/23/18  Yes [provider]  Multiple Vitamins-Minerals (CENTRUM SILVER PO) Take by mouth.   Yes [provider]  nortriptyline (PAMELOR) 25 MG capsule Take 1 capsule (25 mg total) by mouth at bedtime. 03/08/18  Yes [name], PA-C  nortriptyline (PAMELOR) 75 MG capsule TAKE 1 CAPSULE BY MOUTH AT BEDTIME 11/25/17  Yes [name], PA-C  nystatin (MYCOSTATIN/NYSTOP) powder APPLY TOPICALLY TWICE DAILY AS NEEDED FOR YEAST INFECTION 09/27/17  Yes [name], PA-C  OXYGEN O2 3L at home.   Yes [provider]  pravastatin (PRAVACHOL) 20 MG tablet TAKE ONE TABLET BY MOUTH EVERY DAY 10/18/17  Yes [name], PA-C  Probiotic Product (PROBIOTIC PO) Take by mouth.   Yes [provider]  senna-docusate (SENOKOT-S) 8.6-50 MG tablet Take 1 tablet by mouth at bedtime. 03/02/18  Yes Mariel Aloe, MD  SURE COMFORT INSULIN SYRINGE 30G X 1/2" 1 ML MISC USE FOUR (4) TIMES DAILY AS DIRECTED Patient taking differently: Inject 1 Syringe into the skin 4 (four) times daily.  08/31/16  Yes [name], PA-C  tiZANidine (ZANAFLEX) 2 MG tablet Take 2-4 mg by mouth every 12 (twelve) hours as needed for muscle spasms.   Yes  [provider]  varenicline (CHANTIX PAK) 0.5 MG X 11 & 1 MG X 42 tablet Take one 0.5 mg tablet by mouth QD x 3 days, then increase to one 0.5 mg tablet BID x 4 days, then increase to one 1 mg tablet BID. 03/28/18  Yes [name], PA-C  guaiFENesin (MUCINEX) 600 MG 12 hr tablet Take 1 tablet (600 mg total) by mouth 2 (two) times daily. 03/02/18   Mariel Aloe, MD  HUMALOG 100 UNIT/ML injection INJECT 0.15 ML (15 UNITS) INTO THE SKIN 3 TIMES DAILY AS NEEDED FOR HIGH BLOOD SUGAR Patient taking differently:  Inject 15 Units into the skin 3 (three) times daily as needed for high blood sugar. Uses sliding scale. Sugar over 200 will give insulin. 12/29/17   [name], PA-C  insulin glargine (LANTUS) 100 UNIT/ML injection INJECT 0.3MLS (30 UNITS TOTAL) INTO THE SKIN EVERY DAY Patient taking differently: Inject 30 Units into the skin every morning. INJECT 0.3MLS (30 UNITS TOTAL) INTO THE SKIN EVERY DAY 01/24/18   [name], PA-C  levothyroxine (SYNTHROID, LEVOTHROID) 88 MCG tablet Take 1 tablet (88 mcg total) by mouth daily before breakfast. Patient taking differently: Take 88 mcg by mouth at bedtime.  12/08/17   [name], PA-C  metoCLOPramide (REGLAN) 10 MG tablet Take 1 tablet (10 mg total) by mouth every 6 (six) hours as needed for nausea. Patient taking differently: Take 10 mg by mouth 2 (two) times daily.  01/17/18   Cirigliano, Vito V, DO  oxyCODONE (OXY IR/ROXICODONE) 5 MG immediate release tablet Take 5 mg by mouth 2 (two) times daily as needed for breakthrough pain. Take 1 tablet by mouth twice daily as needed 12/09/16   [provider]    Physical Exam: BP 133/61   Pulse (!) 105   Temp 98.3 F (36.8 C) (Oral)   Resp 11   Ht 5\' 4"  (1.626 m)   Wt 131.5 kg   SpO2 100%   BMI 49.78 kg/m   . General: Elderly Caucasian female. Awake and alert and oriented x3. No acute cardiopulmonary distress.  [name] Kitchen HEENT: Normocephalic atraumatic.  Right and left  ears normal in appearance.  Pupils equal, round, reactive to light. Extraocular muscles are intact. Sclerae anicteric and noninjected.  Moist mucosal membranes. No mucosal lesions.  . Neck: Neck supple without lymphadenopathy. No carotid bruits. No masses palpated.  . Cardiovascular: Regular rate with normal S1-S2 sounds. No murmurs, rubs, gallops auscultated. No JVD.  [name] Kitchen Respiratory: Diminished breath sounds throughout.  Rales in bases bilaterally.  No wheezing.  No accessory muscle use. . Abdomen: Morbidly obese.  Soft, nontender, nondistended. Active bowel sounds. No masses or hepatosplenomegaly  . Skin: No rashes, lesions, or ulcerations.  Dry, warm to touch. 2+ dorsalis pedis and radial pulses. . Musculoskeletal: No calf or leg pain. All major joints not erythematous nontender.  No upper or lower joint deformation.  Good ROM.  No contractures  . Psychiatric: Intact judgment and insight. Pleasant and cooperative. . Neurologic: No focal neurological deficits. Strength is 5/5 and symmetric in upper and lower extremities.  Cranial nerves II through XII are grossly intact.           Labs on Admission: I have personally reviewed following labs and imaging studies  CBC: Recent Labs  Lab 04/07/18 1642  WBC 4.5  NEUTROABS 3.7  HGB 9.6*  HCT 35.9*  MCV 89.3  PLT 49*   Basic Metabolic Panel: Recent Labs  Lab 04/07/18 1642  NA 138  K 4.4  CL 93*  CO2 38*  GLUCOSE 156*  BUN 16  CREATININE 1.38*  CALCIUM 8.2*   GFR: Estimated Creatinine Clearance: 55.5 mL/min (A) (by C-G formula based on SCr of 1.38 mg/dL (H)). Liver Function Tests: Recent Labs  Lab 04/07/18 1642  AST 13*  ALT 10  ALKPHOS 46  BILITOT 0.8  PROT 5.8*  ALBUMIN 3.1*   No results for input(s): LIPASE, AMYLASE in the last 168 hours. No results for input(s): AMMONIA in the last 168 hours. Coagulation Profile: No results for input(s): INR, PROTIME in the last 168 hours.  Cardiac Enzymes: Recent Labs  Lab  04/07/18 1642  TROPONINI 0.03*   BNP (last 3 results) No results for input(s): PROBNP in the last 8760 hours. HbA1C: No results for input(s): HGBA1C in the last 72 hours. CBG: Recent Labs  Lab 04/07/18 1655  GLUCAP 140*   Lipid Profile: No results for input(s): CHOL, HDL, LDLCALC, TRIG, CHOLHDL, LDLDIRECT in the last 72 hours. Thyroid Function Tests: No results for input(s): TSH, T4TOTAL, FREET4, T3FREE, THYROIDAB in the last 72 hours. Anemia Panel: No results for input(s): VITAMINB12, FOLATE, FERRITIN, TIBC, IRON, RETICCTPCT in the last 72 hours. Urine analysis:    Component Value Date/Time   COLORURINE YELLOW 04/07/2018 1820   APPEARANCEUR HAZY (A) 04/07/2018 1820   LABSPEC 1.015 04/07/2018 1820   PHURINE 6.0 04/07/2018 1820   GLUCOSEU NEGATIVE 04/07/2018 1820   GLUCOSEU NEGATIVE 08/17/2016 1340   HGBUR SMALL (A) 04/07/2018 1820   BILIRUBINUR NEGATIVE 04/07/2018 1820   BILIRUBINUR neg 01/15/2015 [place] 04/07/2018 1820   PROTEINUR 30 (A) 04/07/2018 1820   UROBILINOGEN 1.0 08/17/2016 1340   NITRITE NEGATIVE 04/07/2018 1820   LEUKOCYTESUR NEGATIVE 04/07/2018 1820   Sepsis Labs: @LABRCNTIP (procalcitonin:4,lacticidven:4) )No results found for this or any previous visit (from the past 240 hour(s)).   Radiological Exams on Admission: Dg Chest Portable 1 View  Result Date: 04/07/2018 CLINICAL DATA:  Shortness of breath EXAM: PORTABLE CHEST 1 VIEW COMPARISON:  03/21/2018 FINDINGS: Chronic cardiomegaly and interstitial coarsening. There is artifact from EKG leads. Subtle opacity over the right upper lobe likely correlates with subpleural opacity on 02/27/2018 chest CT-differences from prior related to rotation. Artifact from EKG leads. IMPRESSION: No acute finding when compared to prior. Electronically Signed   By: Monte Fantasia M.D.   On: 04/07/2018 18:27    EKG: Independently reviewed.  Tachycardia with left atrial  enlargement  Assessment/Plan: Principal Problem:   Acute on chronic respiratory failure with hypoxia and hypercapnia (HCC) Active Problems:   Diabetes mellitus, type II, insulin dependent (HCC)   Thrombocytopenia (HCC)   Essential hypertension   CKD (chronic kidney disease), stage III (HCC)   Cor pulmonale (chronic) (HCC)   COPD (chronic obstructive pulmonary disease) (HCC)   Chronic pain   Acute on chronic diastolic CHF (congestive heart failure) ([place])    This patient was discussed with the ED physician, including pertinent vitals, physical exam findings, labs, and imaging.  We also discussed care given by the ED provider.  1. Acute on chronic respiratory failure with hypoxia and hypercarbia a. Patient meets admission criteria due to tachypnea and hypoxemia despite interventions. 2. Acute on chronic diastolic heart failure Telemetry monitoring Strict I/O Daily Weights Diuresis: Lasix 20 mg IV twice daily Potassium: 40 mEq twice a day by mouth Echo cardiac exam tomorrow Repeat BMP tomorrow 3. Diabetes a. Home insulin regimen with sliding scale insulin CBGs before meals and nightly 4. Hypertension a. Continue antihypertensives 5. Thrombocytopenia a. No heparin products 6. Chronic kidney disease stage III a. Will watch creatinine closely 7. Cor pulmonale (chronic) a.  8. COPD a. Currently compensated b. Continue home regimen  DVT prophylaxis: SCDs Consultants: None Code Status: Full code Family Communication: None Disposition Plan: Should be able to return home following improvement of respiratory status   Truett Mainland, DO [facility] Pager [phone]  If 7PM-7AM, please contact night-coverage [email or web address] Password [email or web address]

## 2018-04-07 NOTE — ED Triage Notes (Signed)
Pt fell while walking up shin. Pt hit RT shin. Pt also reports 20 lb weight gain over 2-3 days. Endorses dyspnea with exertion. Hx of kidney issues and CHF. Pt not currently on dieretics. EMS reports peaked T waves on EKG, EKG transmitted to MD at [facility], ruled not an MI. AO x4.

## 2018-04-07 NOTE — ED Notes (Signed)
Pt states she does not need to urinate at this time, aware of DO  

## 2018-04-07 NOTE — ED Notes (Signed)
Increased oxygen from 2L to 3L due to oxygen at 84%. Sats increased to 92% on 3L.

## 2018-04-08 ENCOUNTER — Inpatient Hospital Stay (HOSPITAL_COMMUNITY): Payer: Medicare Other

## 2018-04-08 DIAGNOSIS — G894 Chronic pain syndrome: Secondary | ICD-10-CM

## 2018-04-08 DIAGNOSIS — J441 Chronic obstructive pulmonary disease with (acute) exacerbation: Secondary | ICD-10-CM

## 2018-04-08 DIAGNOSIS — R0602 Shortness of breath: Secondary | ICD-10-CM

## 2018-04-08 LAB — GLUCOSE, CAPILLARY
Glucose-Capillary: 92 mg/dL (ref 70–99)
Glucose-Capillary: 139 mg/dL — ABNORMAL HIGH (ref 70–99)
Glucose-Capillary: 203 mg/dL — ABNORMAL HIGH (ref 70–99)
Glucose-Capillary: 282 mg/dL — ABNORMAL HIGH (ref 70–99)

## 2018-04-08 LAB — BASIC METABOLIC PANEL
ANION GAP: 4 — AB (ref 5–15)
BUN: 16 mg/dL (ref 8–23)
CO2: 41 mmol/L — ABNORMAL HIGH (ref 22–32)
Calcium: 8.1 mg/dL — ABNORMAL LOW (ref 8.9–10.3)
Chloride: 95 mmol/L — ABNORMAL LOW (ref 98–111)
Creatinine, Ser: 1.35 mg/dL — ABNORMAL HIGH (ref 0.44–1.00)
GFR calc non Af Amer: 41 mL/min — ABNORMAL LOW (ref >60)
GFR, EST AFRICAN AMERICAN: 47 mL/min — AB (ref >60)
GLUCOSE: 114 mg/dL — AB (ref 70–99)
Potassium: 4.3 mmol/L (ref 3.5–5.1)
Sodium: 140 mmol/L (ref 135–145)

## 2018-04-08 LAB — ECHOCARDIOGRAM COMPLETE
Height: 64 in
WEIGHTICAEL: 4797.21 [oz_av]

## 2018-04-08 MED ORDER — METHYLPREDNISOLONE SODIUM SUCC 125 MG IJ SOLR
60.0000 mg | Freq: Four times a day (QID) | INTRAMUSCULAR | Status: DC
  Administered 2018-04-08 – 2018-04-10 (×9): 60 mg via INTRAVENOUS
  Filled 2018-04-08 (×9): qty 2

## 2018-04-08 MED ORDER — IPRATROPIUM-ALBUTEROL 0.5-2.5 (3) MG/3ML IN SOLN
3.0000 mL | Freq: Four times a day (QID) | RESPIRATORY_TRACT | Status: DC
  Administered 2018-04-08 – 2018-04-09 (×4): 3 mL via RESPIRATORY_TRACT
  Filled 2018-04-08 (×3): qty 3

## 2018-04-08 MED ORDER — BUDESONIDE 0.5 MG/2ML IN SUSP
0.5000 mg | Freq: Two times a day (BID) | RESPIRATORY_TRACT | Status: DC
  Administered 2018-04-08 – 2018-04-11 (×7): 0.5 mg via RESPIRATORY_TRACT
  Filled 2018-04-08 (×7): qty 2

## 2018-04-08 MED ORDER — IPRATROPIUM-ALBUTEROL 0.5-2.5 (3) MG/3ML IN SOLN: 3.0000 mL | RESPIRATORY_TRACT | Status: DC | PRN

## 2018-04-08 MED ORDER — FUROSEMIDE 10 MG/ML IJ SOLN
40.0000 mg | Freq: Every day | INTRAMUSCULAR | Status: DC
  Administered 2018-04-08 – 2018-04-09 (×2): 40 mg via INTRAVENOUS
  Filled 2018-04-08 (×2): qty 4

## 2018-04-08 MED ORDER — INSULIN GLARGINE 100 UNIT/ML ~~LOC~~ SOLN
20.0000 [IU] | Freq: Every morning | SUBCUTANEOUS | Status: DC
  Administered 2018-04-08 – 2018-04-09 (×2): 20 [IU] via SUBCUTANEOUS
  Filled 2018-04-08 (×3): qty 0.2

## 2018-04-08 NOTE — Progress Notes (Signed)
PROGRESS NOTE  Renee Noble GHW:[id number] DOB: [birth year] DOA: 04/07/2018 PCP: [name], PA-C  Brief History:  62 year old female with a history of diastolic CHF, OSA, COPD, diabetes mellitus type 2, CKD stage III, chronic pain syndrome, solitary kidney status post left nephrectomy, and chronic respiratory failure on 3 L nasal cannula presenting with 1 week history of worsening shortness of breath, increasing lower extremity edema.  The patient had a mechanical fall on 04/07/2018 which prompted her to call EMS because she could not get up.  She denied any syncope.  Upon EMS arrival, the patient was noted to be short of breath and hypoxic with oxygen saturation in the 70s.  She denies any fevers, chills, chest pain, sore throat, headache, neck pain.  She has chronic cough but denies any hemoptysis.  There is not been any nausea, vomiting, diarrhea.  The patient was recently discharged from the hospital after stay from 02/24/2018 through 03/02/2018 during which she was treated for pneumonia and COPD exacerbation.  At that time, she was instructed to discontinue torsemide and losartan because of her CKD.  Her discharge weight was 258.6 pounds.  She has yet to follow-up with her nephrologist or primary care provider.  Upon presentation, the patient was afebrile hemodynamically stable.  CBC was unremarkable with baseline hemoglobin and thrombocytopenia.  BMP and LFTs were unremarkable except for serum creatinine 1.38 which was near her baseline.  Chest x-ray showed vascular congestion and chronic interstitial changes.  The patient was given furosemide 20 mg IV x1 and admitted for further evaluation.  Assessment/Plan: Acute on chronic respiratory failure with hypoxia and hypercarbia -Secondary to CHF exacerbation and COPD exacerbation -Presently stable on 5 L nasal cannula -Patient is normally on 3 L nasal cannula at home -Pulmonary hygiene -Personally reviewed chest x-ray--vascular  congestion/chronic interstitial changes; no consolidation  Acute on chronic diastolic CHF -78/93/8101 discharge weight 258.6 pounds -Admission weight 299 pounds -Continue IV furosemide -Daily weights -Accurate I's and O's -Echocardiogram -personally reviewed EKG--sinus nonspecific T wave changes  COPD exacerbation -Start Pulmicort -Start duo nebs -Start IV Solu-Medrol -Patient has over 80-pack-year history  CKD stage III -Baseline creatinine 1.4-1.5 -Monitor with diuresis -A.m. BMP  Diabetes mellitus type 2 with gastroparesis -Hemoglobin A1c -Continue reduced dose Lantus -NovoLog sliding scale -Anticipate elevated CBG secondary to steroids -Continue metoclopramide  Chronic pain syndrome -Continue home dose MS Contin and oxycodone -The [place] Controlled Substance Reporting System has been queried for this patient--no red flags -Continue nortriptyline  Hyperlipidemia -Continue statin  Hypothyroidism -Continue Synthroid  Thrombocytopenia -This appears to be chronic upon review of medical records -Monitor for signs of bleeding -Serum B51 -Folic acid  Leg pain and edema -Venous duplex    Disposition Plan:   Home in 2-3 days  Family Communication:   No Family at bedside  Consultants:  none  Code Status:  FULL  DVT Prophylaxis:  SCDs   Procedures: As Listed in Progress Note Above  Antibiotics: None      Subjective: Patient continues to complain of a nonproductive cough and shortness of breath.  She states that her shortness of breath is low but better than yesterday.  She denies any fevers, chills, chest pain, nausea, vomiting, diarrhea, abdominal pain.  There is no dysuria hematuria.  Objective: Vitals:   04/07/18 2104 04/07/18 2212 04/08/18 0500 04/08/18 0612  BP: 129/61   (!) 114/59  Pulse: (!) 104   74  Resp: (!) 22   [name] Kitchen)  22  Temp: 98.3 F (36.8 C)   98.4 F (36.9 C)  TempSrc: Oral   Oral  SpO2: 98% 95%    Weight:   136 kg     Height:   5\' 4"  (1.626 m)     Intake/Output Summary (Last 24 hours) at 04/08/2018 0947 Last data filed at 04/08/2018 0500 Gross per 24 hour  Intake -  Output 1200 ml  Net -1200 ml   Weight change:  Exam:   General:  Pt is alert, follows commands appropriately, not in acute distress  HEENT: No icterus, No thrush, No neck mass, [place]/AT  Cardiovascular: RRR, S1/S2, no rubs, no gallops +JVD  Respiratory: Bibasilar crackles.  Bilateral expiratory wheeze.  Good air movement.  Abdomen: Soft/+BS, non tender, non distended, no guarding  Extremities: 2+ lower extremity edema, No lymphangitis, No petechiae, No rashes, no synovitis   Data Reviewed: I have personally reviewed following labs and imaging studies Basic Metabolic Panel: Recent Labs  Lab 04/07/18 1642 04/08/18 0645  NA 138 140  K 4.4 4.3  CL 93* 95*  CO2 38* 41*  GLUCOSE 156* 114*  BUN 16 16  CREATININE 1.38* 1.35*  CALCIUM 8.2* 8.1*   Liver Function Tests: Recent Labs  Lab 04/07/18 1642  AST 13*  ALT 10  ALKPHOS 46  BILITOT 0.8  PROT 5.8*  ALBUMIN 3.1*   No results for input(s): LIPASE, AMYLASE in the last 168 hours. No results for input(s): AMMONIA in the last 168 hours. Coagulation Profile: No results for input(s): INR, PROTIME in the last 168 hours. CBC: Recent Labs  Lab 04/07/18 1642  WBC 4.5  NEUTROABS 3.7  HGB 9.6*  HCT 35.9*  MCV 89.3  PLT 49*   Cardiac Enzymes: Recent Labs  Lab 04/07/18 1642  TROPONINI 0.03*   BNP: Invalid input(s): POCBNP CBG: Recent Labs  Lab 04/07/18 1655 04/07/18 2106 04/08/18 0801  GLUCAP 140* 128* 92   HbA1C: No results for input(s): HGBA1C in the last 72 hours. Urine analysis:    Component Value Date/Time   COLORURINE YELLOW 04/07/2018 1820   APPEARANCEUR HAZY (A) 04/07/2018 1820   LABSPEC 1.015 04/07/2018 1820   PHURINE 6.0 04/07/2018 1820   GLUCOSEU NEGATIVE 04/07/2018 1820   GLUCOSEU NEGATIVE 08/17/2016 1340   HGBUR SMALL (A) 04/07/2018  1820   BILIRUBINUR NEGATIVE 04/07/2018 1820   BILIRUBINUR neg 01/15/2015 [place] 04/07/2018 1820   PROTEINUR 30 (A) 04/07/2018 1820   UROBILINOGEN 1.0 08/17/2016 1340   NITRITE NEGATIVE 04/07/2018 1820   LEUKOCYTESUR NEGATIVE 04/07/2018 1820   Sepsis Labs: @LABRCNTIP (procalcitonin:4,lacticidven:4) )No results found for this or any previous visit (from the past 240 hour(s)).   Scheduled Meds: . furosemide  40 mg Intravenous BID  . guaiFENesin  600 mg Oral BID  . insulin aspart  0-20 Units Subcutaneous TID WC  . insulin aspart  0-5 Units Subcutaneous QHS  . insulin glargine  30 Units Subcutaneous [id number]  . ipratropium-albuterol  3 mL Nebulization TID  . levothyroxine  88 mcg Oral QHS  . metoCLOPramide  10 mg Oral BID  . mometasone-formoterol  2 puff Inhalation BID  . morphine  30 mg Oral Q12H  . nortriptyline  25 mg Oral QHS  . nystatin   Topical TID  . potassium chloride  20 mEq Oral BID  . pravastatin  20 mg Oral Daily  . senna-docusate  1 tablet Oral QHS  . sodium chloride flush  3 mL Intravenous Q12H   Continuous Infusions: .  sodium chloride      Procedures/Studies: Dg Chest 2 View  Result Date: 03/21/2018 CLINICAL DATA:  Cough, congestion and shortness of breath. Former cigarette smoker. EXAM: CHEST - 2 VIEW COMPARISON:  PA and lateral chest 04/13/2017 and 02/27/2018. CT chest 02/27/2018. FINDINGS: The lungs are emphysematous. Small nodular opacities in the left apex seen on prior CT are difficult to visualize on this examination. No consolidative process or pneumothorax is identified. Small left effusion is noted. No acute or focal bony abnormality. IMPRESSION: Small left pleural effusion. Emphysema. Atherosclerosis. Electronically Signed   By: Inge Rise M.D.   On: 03/21/2018 10:59   Dg Chest Portable 1 View  Result Date: 04/07/2018 CLINICAL DATA:  Shortness of breath EXAM: PORTABLE CHEST 1 VIEW COMPARISON:  03/21/2018 FINDINGS: Chronic  cardiomegaly and interstitial coarsening. There is artifact from EKG leads. Subtle opacity over the right upper lobe likely correlates with subpleural opacity on 02/27/2018 chest CT-differences from prior related to rotation. Artifact from EKG leads. IMPRESSION: No acute finding when compared to prior. Electronically Signed   By: Monte Fantasia M.D.   On: 04/07/2018 18:27    Orson Eva, DO  [facility] Pager [phone]  If 7PM-7AM, please contact night-coverage [email or web address] Password [email or web address] 04/08/2018, 8:23 AM   LOS: 1 day

## 2018-04-08 NOTE — Progress Notes (Signed)
*  PRELIMINARY RESULTS* Echocardiogram 2D Echocardiogram has been performed.  Samuel Germany 04/08/2018, 11:37 AM

## 2018-04-09 LAB — BASIC METABOLIC PANEL
Anion gap: 9 (ref 5–15)
BUN: 23 mg/dL (ref 8–23)
CHLORIDE: 90 mmol/L — AB (ref 98–111)
CO2: 40 mmol/L — ABNORMAL HIGH (ref 22–32)
Calcium: 8.5 mg/dL — ABNORMAL LOW (ref 8.9–10.3)
Creatinine, Ser: 1.58 mg/dL — ABNORMAL HIGH (ref 0.44–1.00)
GFR calc Af Amer: 39 mL/min — ABNORMAL LOW (ref >60)
GFR calc non Af Amer: 34 mL/min — ABNORMAL LOW (ref >60)
GLUCOSE: 260 mg/dL — AB (ref 70–99)
POTASSIUM: 4.2 mmol/L (ref 3.5–5.1)
SODIUM: 139 mmol/L (ref 135–145)

## 2018-04-09 LAB — MAGNESIUM: Magnesium: 1.7 mg/dL (ref 1.7–2.4)

## 2018-04-09 LAB — GLUCOSE, CAPILLARY
GLUCOSE-CAPILLARY: 270 mg/dL — AB (ref 70–99)
Glucose-Capillary: 225 mg/dL — ABNORMAL HIGH (ref 70–99)
Glucose-Capillary: 222 mg/dL — ABNORMAL HIGH (ref 70–99)
Glucose-Capillary: 224 mg/dL — ABNORMAL HIGH (ref 70–99)

## 2018-04-09 LAB — HEMOGLOBIN A1C
HEMOGLOBIN A1C: 5.2 % (ref 4.8–5.6)
Mean Plasma Glucose: 102.54 mg/dL

## 2018-04-09 LAB — VITAMIN B12: Vitamin B-12: 368 pg/mL (ref 180–914)

## 2018-04-09 LAB — FOLATE: FOLATE: 5.9 ng/mL — AB (ref >5.9)

## 2018-04-09 MED ORDER — IPRATROPIUM-ALBUTEROL 0.5-2.5 (3) MG/3ML IN SOLN
3.0000 mL | Freq: Four times a day (QID) | RESPIRATORY_TRACT | Status: DC
  Administered 2018-04-09 – 2018-04-11 (×7): 3 mL via RESPIRATORY_TRACT
  Filled 2018-04-09 (×8): qty 3

## 2018-04-09 MED ORDER — TORSEMIDE 20 MG PO TABS
20.0000 mg | ORAL_TABLET | Freq: Every day | ORAL | Status: DC
  Administered 2018-04-10 – 2018-04-11 (×2): 20 mg via ORAL
  Filled 2018-04-09 (×2): qty 1

## 2018-04-09 MED ORDER — METOPROLOL TARTRATE 25 MG PO TABS
12.5000 mg | ORAL_TABLET | Freq: Two times a day (BID) | ORAL | Status: DC
  Administered 2018-04-09 – 2018-04-10 (×3): 12.5 mg via ORAL
  Filled 2018-04-09 (×3): qty 1

## 2018-04-09 MED ORDER — INSULIN ASPART 100 UNIT/ML ~~LOC~~ SOLN
3.0000 [IU] | Freq: Three times a day (TID) | SUBCUTANEOUS | Status: DC
  Administered 2018-04-09 – 2018-04-11 (×6): 3 [IU] via SUBCUTANEOUS

## 2018-04-09 MED ORDER — INSULIN GLARGINE 100 UNIT/ML ~~LOC~~ SOLN
30.0000 [IU] | Freq: Every morning | SUBCUTANEOUS | Status: DC
  Administered 2018-04-10 – 2018-04-11 (×2): 30 [IU] via SUBCUTANEOUS
  Filled 2018-04-09 (×3): qty 0.3

## 2018-04-09 MED ORDER — FOLIC ACID 1 MG PO TABS
1.0000 mg | ORAL_TABLET | Freq: Every day | ORAL | Status: DC
  Administered 2018-04-09 – 2018-04-11 (×3): 1 mg via ORAL
  Filled 2018-04-09 (×3): qty 1

## 2018-04-09 NOTE — Progress Notes (Addendum)
PROGRESS NOTE  Renee Noble JYN:[id number] DOB: [birth year] DOA: 04/07/2018 PCP: [name], PA-C  Brief History:  62 year old female with a history of diastolic CHF, OSA, COPD, diabetes mellitus type 2, CKD stage III, chronic pain syndrome, solitary kidney status post left nephrectomy, and chronic respiratory failure on 3 L nasal cannula presenting with 1 week history of worsening shortness of breath, increasing lower extremity edema.  The patient had a mechanical fall on 04/07/2018 which prompted her to call EMS because she could not get up.  She denied any syncope.  Upon EMS arrival, the patient was noted to be short of breath and hypoxic with oxygen saturation in the 70s.  She denies any fevers, chills, chest pain, sore throat, headache, neck pain.  She has chronic cough but denies any hemoptysis.  There is not been any nausea, vomiting, diarrhea.  The patient was recently discharged from the hospital after stay from 02/24/2018 through 03/02/2018 during which she was treated for pneumonia and COPD exacerbation.  At that time, she was instructed to discontinue torsemide and losartan because of her CKD.  Her discharge weight was 258.6 pounds.  She has yet to follow-up with her nephrologist or primary care provider.  Upon presentation, the patient was afebrile hemodynamically stable.  CBC was unremarkable with baseline hemoglobin and thrombocytopenia.  BMP and LFTs were unremarkable except for serum creatinine 1.38 which was near her baseline.  Chest x-ray showed vascular congestion and chronic interstitial changes.  The patient was given furosemide 20 mg IV x1 and admitted for further evaluation.  She was continued on lasix and solumedrol with clinical improvement.  Assessment/Plan: Acute on chronic respiratory failure with hypoxia and hypercarbia -Secondary to CHF exacerbation and COPD exacerbation -Presently stable on 5 L nasal cannula -Patient is normally on 3 L nasal cannula at  home -Pulmonary hygiene -Personally reviewed chest x-ray--vascular congestion/chronic interstitial changes; no consolidation  Acute on chronic diastolic CHF -86/57/8469 discharge weight 258.6 pounds -Admission weight 299 pounds -Continue IV furosemide -NEG 3.2 lbs -NEG 4.1L -Accurate I's and O's -Echocardiogram -personally reviewed EKG--sinus nonspecific T wave changes  COPD exacerbation -Continue Pulmicort -Conitnue duo nebs -Continue IV Solu-Medrol -Patient has over 80-pack-year history  CKD stage III -Baseline creatinine 1.4-1.5 -Monitor with diuresis -A.m. BMP  Diabetes mellitus type 2 with gastroparesis -Hemoglobin A1c--5.2 -increase lantus to 30 units -novolog 3 units with meals -NovoLog sliding scale -Anticipate elevated CBG secondary to steroids -Continue metoclopramide  Chronic pain syndrome -Continue home dose MS Contin and oxycodone -The [place] Controlled Substance Reporting System has been queried for this patient--no red flags -Continue nortriptyline  Hyperlipidemia -Continue statin  Hypothyroidism -Continue Synthroid  Thrombocytopenia -This appears to be chronic upon review of medical records -Monitor for signs of bleeding -Serum G29--528 -Folic UXLK--4.4-->[id number]  Leg pain and edema -Venous duplex--neg    Disposition Plan:   Home in 1-2 days  Family Communication:   No Family at bedside  Consultants:  none  Code Status:  FULL  DVT Prophylaxis:  SCDs   Procedures: As Listed in Progress Note Above  Antibiotics: None      Subjective: Pt states she is breathing 50% better.  Denies cp, n/v/d abd pain.  No fever/chills.  Still with some sob with exertion  Objective: Vitals:   04/09/18 0138 04/09/18 0500 04/09/18 0600 04/09/18 0758  BP:   128/71   Pulse:   97   Resp:   (!) 22   Temp:  98.1 F (36.7 C)   TempSrc:      SpO2: 92%  94% 95%  Weight:  134.2 kg    Height:        Intake/Output  Summary (Last 24 hours) at 04/09/2018 1250 Last data filed at 04/09/2018 1127 Gross per 24 hour  Intake 840 ml  Output 4100 ml  Net -3260 ml   Weight change: 2.657 kg Exam:   General:  Pt is alert, follows commands appropriately, not in acute distress  HEENT: No icterus, No thrush, No neck mass, [place]/AT  Cardiovascular: RRR, S1/S2, no rubs, no gallops  Respiratory: bibasilar rales.  Bilateral exp wheeze  Abdomen: Soft/+BS, non tender, non distended, no guarding  Extremities: 1 + LE edema, No lymphangitis, No petechiae, No rashes, no synovitis   Data Reviewed: I have personally reviewed following labs and imaging studies Basic Metabolic Panel: Recent Labs  Lab 04/07/18 1642 04/08/18 0645 04/09/18 0538  NA 138 140 139  K 4.4 4.3 4.2  CL 93* 95* 90*  CO2 38* 41* 40*  GLUCOSE 156* 114* 260*  BUN 16 16 23   CREATININE 1.38* 1.35* 1.58*  CALCIUM 8.2* 8.1* 8.5*  MG  --   --  1.7   Liver Function Tests: Recent Labs  Lab 04/07/18 1642  AST 13*  ALT 10  ALKPHOS 46  BILITOT 0.8  PROT 5.8*  ALBUMIN 3.1*   No results for input(s): LIPASE, AMYLASE in the last 168 hours. No results for input(s): AMMONIA in the last 168 hours. Coagulation Profile: No results for input(s): INR, PROTIME in the last 168 hours. CBC: Recent Labs  Lab 04/07/18 1642  WBC 4.5  NEUTROABS 3.7  HGB 9.6*  HCT 35.9*  MCV 89.3  PLT 49*   Cardiac Enzymes: Recent Labs  Lab 04/07/18 1642  TROPONINI 0.03*   BNP: Invalid input(s): POCBNP CBG: Recent Labs  Lab 04/08/18 1155 04/08/18 1648 04/08/18 2214 04/09/18 0759 04/09/18 1126  GLUCAP 139* 203* 282* 270* 225*   HbA1C: Recent Labs    04/09/18 0539  HGBA1C 5.2   Urine analysis:    Component Value Date/Time   COLORURINE YELLOW 04/07/2018 1820   APPEARANCEUR HAZY (A) 04/07/2018 1820   LABSPEC 1.015 04/07/2018 1820   PHURINE 6.0 04/07/2018 1820   GLUCOSEU NEGATIVE 04/07/2018 1820   GLUCOSEU NEGATIVE 08/17/2016 1340   HGBUR  SMALL (A) 04/07/2018 1820   BILIRUBINUR NEGATIVE 04/07/2018 1820   BILIRUBINUR neg 01/15/2015 1353   KETONESUR NEGATIVE 04/07/2018 1820   PROTEINUR 30 (A) 04/07/2018 1820   UROBILINOGEN 1.0 08/17/2016 1340   NITRITE NEGATIVE 04/07/2018 1820   LEUKOCYTESUR NEGATIVE 04/07/2018 1820   Sepsis Labs: @LABRCNTIP (procalcitonin:4,lacticidven:4) )No results found for this or any previous visit (from the past 240 hour(s)).   Scheduled Meds: . budesonide (PULMICORT) nebulizer solution  0.5 mg Nebulization BID  . furosemide  40 mg Intravenous Daily  . guaiFENesin  600 mg Oral BID  . insulin aspart  0-20 Units Subcutaneous TID WC  . insulin aspart  0-5 Units Subcutaneous QHS  . insulin glargine  20 Units Subcutaneous q morning - 10a  . ipratropium-albuterol  3 mL Nebulization Q6H WA  . levothyroxine  88 mcg Oral QHS  . methylPREDNISolone (SOLU-MEDROL) injection  60 mg Intravenous Q6H  . metoCLOPramide  10 mg Oral BID  . morphine  30 mg Oral Q12H  . nortriptyline  25 mg Oral QHS  . nystatin   Topical TID  . potassium chloride  20 mEq Oral BID  .  pravastatin  20 mg Oral Daily  . senna-docusate  1 tablet Oral QHS  . sodium chloride flush  3 mL Intravenous Q12H   Continuous Infusions: . sodium chloride      Procedures/Studies: Dg Chest 2 View  Result Date: 03/21/2018 CLINICAL DATA:  Cough, congestion and shortness of breath. Former cigarette smoker. EXAM: CHEST - 2 VIEW COMPARISON:  PA and lateral chest 04/13/2017 and 02/27/2018. CT chest 02/27/2018. FINDINGS: The lungs are emphysematous. Small nodular opacities in the left apex seen on prior CT are difficult to visualize on this examination. No consolidative process or pneumothorax is identified. Small left effusion is noted. No acute or focal bony abnormality. IMPRESSION: Small left pleural effusion. Emphysema. Atherosclerosis. Electronically Signed   By: Inge Rise M.D.   On: 03/21/2018 10:59   US Venous Img Lower Bilateral  Result  Date: 04/08/2018 CLINICAL DATA:  BILATERAL lower extremity pain, morbid obesity, lower extremity edema LEFT greater than RIGHT EXAM: BILATERAL LOWER EXTREMITY VENOUS DOPPLER ULTRASOUND TECHNIQUE: Gray-scale sonography with graded compression, as well as color Doppler and duplex ultrasound were performed to evaluate the lower extremity deep venous systems from the level of the common femoral vein and including the common femoral, femoral, profunda femoral, popliteal and calf veins including the posterior tibial, peroneal and gastrocnemius veins when visible. The superficial great saphenous vein was also interrogated. Spectral Doppler was utilized to evaluate flow at rest and with distal augmentation maneuvers in the common femoral, femoral and popliteal veins. Degradation of image quality secondary to body habitus. COMPARISON:  LEFT lower extremity venous ultrasound 08/18/2016 FINDINGS: RIGHT LOWER EXTREMITY Common Femoral Vein: No evidence of thrombus. Normal compressibility, respiratory phasicity and response to augmentation. Saphenofemoral Junction: No evidence of thrombus. Normal compressibility and flow on color Doppler imaging. Profunda Femoral Vein: No evidence of thrombus. Normal compressibility and flow on color Doppler imaging. Femoral Vein: No evidence of thrombus. Normal compressibility, respiratory phasicity and response to augmentation. Popliteal Vein: No evidence of thrombus. Normal compressibility, respiratory phasicity and response to augmentation. Calf Veins: No evidence of thrombus. Normal compressibility and flow on color Doppler imaging. Superficial Great Saphenous Vein: No evidence of thrombus. Normal compressibility. Venous Reflux:  None. Other Findings:  None. LEFT LOWER EXTREMITY Common Femoral Vein: No evidence of thrombus. Normal compressibility, respiratory phasicity and response to augmentation. Saphenofemoral Junction: No evidence of thrombus. Normal compressibility and flow on color  Doppler imaging. Profunda Femoral Vein: No evidence of thrombus. Normal compressibility and flow on color Doppler imaging. Femoral Vein: No evidence of thrombus. Normal compressibility, respiratory phasicity and response to augmentation. Popliteal Vein: No evidence of thrombus. Normal compressibility, respiratory phasicity and response to augmentation. Calf Veins: No evidence of thrombus. Normal compressibility and flow on color Doppler imaging. Superficial Great Saphenous Vein: No evidence of thrombus. Normal compressibility. Venous Reflux:  None. Other Findings:  None. IMPRESSION: No evidence of deep venous thrombosis in either lower extremity. Electronically Signed   By: Lavonia Dana M.D.   On: 04/08/2018 11:59   Dg Chest Portable 1 View  Result Date: 04/07/2018 CLINICAL DATA:  Shortness of breath EXAM: PORTABLE CHEST 1 VIEW COMPARISON:  03/21/2018 FINDINGS: Chronic cardiomegaly and interstitial coarsening. There is artifact from EKG leads. Subtle opacity over the right upper lobe likely correlates with subpleural opacity on 02/27/2018 chest CT-differences from prior related to rotation. Artifact from EKG leads. IMPRESSION: No acute finding when compared to prior. Electronically Signed   By: Monte Fantasia M.D.   On: 04/07/2018 18:27    Shanon Brow  Kylie Simmonds, DO  [facility] Pager [phone]  If 7PM-7AM, please contact night-coverage [email or web address] Password [email or web address] 04/09/2018, 12:50 PM   LOS: 2 days

## 2018-04-10 DIAGNOSIS — Z6841 Body Mass Index (BMI) 40.0 and over, adult: Secondary | ICD-10-CM

## 2018-04-10 LAB — GLUCOSE, CAPILLARY
Glucose-Capillary: 167 mg/dL — ABNORMAL HIGH (ref 70–99)
Glucose-Capillary: 206 mg/dL — ABNORMAL HIGH (ref 70–99)
Glucose-Capillary: 172 mg/dL — ABNORMAL HIGH (ref 70–99)
Glucose-Capillary: 182 mg/dL — ABNORMAL HIGH (ref 70–99)

## 2018-04-10 LAB — BASIC METABOLIC PANEL
Anion gap: 9 (ref 5–15)
BUN: 29 mg/dL — AB (ref 8–23)
CHLORIDE: 89 mmol/L — AB (ref 98–111)
CO2: 40 mmol/L — AB (ref 22–32)
CREATININE: 1.37 mg/dL — AB (ref 0.44–1.00)
Calcium: 8.7 mg/dL — ABNORMAL LOW (ref 8.9–10.3)
GFR calc Af Amer: 46 mL/min — ABNORMAL LOW (ref >60)
GFR calc non Af Amer: 40 mL/min — ABNORMAL LOW (ref >60)
Glucose, Bld: 210 mg/dL — ABNORMAL HIGH (ref 70–99)
Potassium: 5.1 mmol/L (ref 3.5–5.1)
Sodium: 138 mmol/L (ref 135–145)

## 2018-04-10 MED ORDER — DILTIAZEM HCL 30 MG PO TABS
30.0000 mg | ORAL_TABLET | Freq: Four times a day (QID) | ORAL | Status: DC
  Administered 2018-04-10 – 2018-04-11 (×3): 30 mg via ORAL
  Filled 2018-04-10 (×3): qty 1

## 2018-04-10 MED ORDER — PREDNISONE 20 MG PO TABS
60.0000 mg | ORAL_TABLET | Freq: Every day | ORAL | Status: DC
  Administered 2018-04-11: 60 mg via ORAL
  Filled 2018-04-10: qty 6

## 2018-04-10 MED ORDER — METHYLPREDNISOLONE SODIUM SUCC 125 MG IJ SOLR: 60.0000 mg | Freq: Once | INTRAMUSCULAR | Status: DC

## 2018-04-10 NOTE — Progress Notes (Addendum)
PROGRESS NOTE  Renee Noble JXB:[id number] DOB: Sep 26, 1955 DOA: 04/07/2018 PCP: [name], PA-C  Brief History: 61 year old female with a history of diastolic CHF, OSA, COPD, diabetes mellitus type 2, CKD stage III, chronic pain syndrome, solitary kidney status post left nephrectomy, and chronic respiratory failure on 3 L nasal cannula presenting with 1 week history of worsening shortness of breath, increasing lower extremity edema. The patient had a mechanical fall on 04/07/2018 which prompted her to call EMS because she could not get up. She denied any syncope.Upon EMS arrival, the patient was noted to be short of breath and hypoxic with oxygen saturation in the 70s. She denies any fevers, chills, chest pain, sore throat, headache, neck pain. She has chronic cough but denies any hemoptysis. There is not been any nausea, vomiting, diarrhea. The patient was recently discharged from the hospital after stay from 02/24/2018 through 03/02/2018 during which she was treated for pneumonia and COPD exacerbation. At that time, she was instructed to discontinue torsemide and losartan because of her CKD. Her discharge weight was 258.6 pounds. She has yet to follow-up with her nephrologist or primary care provider. Upon presentation, the patient was afebrile hemodynamically stable. CBC was unremarkable with baseline hemoglobin and thrombocytopenia. BMP and LFTs were unremarkable except for serum creatinine 1.38 which was near her baseline. Chest x-ray showed vascular congestion and chronic interstitial changes. The patient was given furosemide 20 mg IV x1 and admitted for further evaluation.  She was continued on lasix and solumedrol with clinical improvement.  Assessment/Plan: Acute on chronic respiratory failure with hypoxia and hypercarbia -Secondary to CHF exacerbation and COPD exacerbation -Presently stable on 5 >>>3L -Patient is normally on 3 L nasal cannula at  home -Pulmonary hygiene -Personally reviewed chest x-ray--vascular congestion/chronic interstitial changes;no consolidation  Acute on chronic diastolic CHF -13/12/6576 discharge weight 258.6 pounds-->?accurate -Admission weight 299 pounds -Continue IV furosemide -NEG 3.8 lbs -NEG 7L -Accurate I's and O's -Echocardiogram--60-65%, no WMA, mild AS -personally reviewed EKG--sinus nonspecific T wave changes  COPD exacerbation -Continue Pulmicort -Conitnue duo nebs -Continue IV Solu-Medrol -Patient has over 80-pack-year history  SVT/MAT -HR intermittently into 120s -start diltiazem  CKD stage III -Baseline creatinine 1.4-1.5 -Monitor with diuresis -A.m. BMP  Diabetes mellitus type 2 with gastroparesis -Hemoglobin A1c--5.2 -increase lantus to 30 units -novolog 3 units with meals -NovoLog sliding scale -Anticipate elevated CBG secondary to steroids -Continue metoclopramide  Chronic pain syndrome -Continue home dose MS Contin and oxycodone -The [place] Controlled Substance Reporting System has been queried for this patient--no red flags -Continue nortriptyline  Hyperlipidemia -Continue statin  Hypothyroidism -Continue Synthroid  Thrombocytopenia -This appears to be chronic upon review of medical records -Monitor for signs of bleeding -Serum I69--629 -Folic BMWU--1.3-->[id number]  Leg pain and edema -Venous duplex--neg  Morbid Obesity -BMI 50.67 -lifestyle modification    Disposition Plan: Home 11/26 if stable Family Communication:NoFamily at bedside  Consultants:none  Code Status: FULL  DVT Prophylaxis: SCDs   Procedures: As Listed in Progress Note Above  Antibiotics: None    Subjective: Patient denies fevers, chills, headache, chest pain, dyspnea, nausea, vomiting, diarrhea, abdominal pain, dysuria, hematuria, hematochezia, and melena. Able to walk hall with only mild sob  Objective: Vitals:   04/10/18  0806 04/10/18 0911 04/10/18 1351 04/10/18 1445  BP:  138/74 133/80   Pulse:  99 96   Resp:   20   Temp:   98.3 F (36.8 C)   TempSrc:  Oral   SpO2: 100%  92% 93%  Weight:      Height:        Intake/Output Summary (Last 24 hours) at 04/10/2018 1725 Last data filed at 04/10/2018 1352 Gross per 24 hour  Intake 720 ml  Output 2200 ml  Net -1480 ml   Weight change: -0.3 kg Exam:   General:  Pt is alert, follows commands appropriately, not in acute distress  HEENT: No icterus, No thrush, No neck mass, [place]/AT  Cardiovascular: RRR, S1/S2, no rubs, no gallops  Respiratory: bibasilar crackles, minimal basilar wheeze  Abdomen: Soft/+BS, non tender, non distended, no guarding  Extremities: trace LE edema, No lymphangitis, No petechiae, No rashes, no synovitis   Data Reviewed: I have personally reviewed following labs and imaging studies Basic Metabolic Panel: Recent Labs  Lab 04/07/18 1642 04/08/18 0645 04/09/18 0538 04/10/18 0546  NA 138 140 139 138  K 4.4 4.3 4.2 5.1  CL 93* 95* 90* 89*  CO2 38* 41* 40* 40*  GLUCOSE 156* 114* 260* 210*  BUN 16 16 23  29*  CREATININE 1.38* 1.35* 1.58* 1.37*  CALCIUM 8.2* 8.1* 8.5* 8.7*  MG  --   --  1.7  --    Liver Function Tests: Recent Labs  Lab 04/07/18 1642  AST 13*  ALT 10  ALKPHOS 46  BILITOT 0.8  PROT 5.8*  ALBUMIN 3.1*   No results for input(s): LIPASE, AMYLASE in the last 168 hours. No results for input(s): AMMONIA in the last 168 hours. Coagulation Profile: No results for input(s): INR, PROTIME in the last 168 hours. CBC: Recent Labs  Lab 04/07/18 1642  WBC 4.5  NEUTROABS 3.7  HGB 9.6*  HCT 35.9*  MCV 89.3  PLT 49*   Cardiac Enzymes: Recent Labs  Lab 04/07/18 1642  TROPONINI 0.03*   BNP: Invalid input(s): POCBNP CBG: Recent Labs  Lab 04/09/18 1641 04/09/18 2255 04/10/18 0811 04/10/18 1138 04/10/18 1658  GLUCAP 222* 224* 167* 206* 172*   HbA1C: Recent Labs    04/09/18 0539  HGBA1C  5.2   Urine analysis:    Component Value Date/Time   COLORURINE YELLOW 04/07/2018 1820   APPEARANCEUR HAZY (A) 04/07/2018 1820   LABSPEC 1.015 04/07/2018 1820   PHURINE 6.0 04/07/2018 1820   GLUCOSEU NEGATIVE 04/07/2018 1820   GLUCOSEU NEGATIVE 08/17/2016 1340   HGBUR SMALL (A) 04/07/2018 1820   BILIRUBINUR NEGATIVE 04/07/2018 1820   BILIRUBINUR neg 01/15/2015 [place] 04/07/2018 1820   PROTEINUR 30 (A) 04/07/2018 1820   UROBILINOGEN 1.0 08/17/2016 1340   NITRITE NEGATIVE 04/07/2018 1820   LEUKOCYTESUR NEGATIVE 04/07/2018 1820   Sepsis Labs: @LABRCNTIP (procalcitonin:4,lacticidven:4) )No results found for this or any previous visit (from the past 240 hour(s)).   Scheduled Meds: . budesonide (PULMICORT) nebulizer solution  0.5 mg Nebulization BID  . diltiazem  30 mg Oral T7D  . folic acid  1 mg Oral Daily  . guaiFENesin  600 mg Oral BID  . insulin aspart  0-20 Units Subcutaneous TID WC  . insulin aspart  0-5 Units Subcutaneous QHS  . insulin aspart  3 Units Subcutaneous TID WC  . insulin glargine  30 Units Subcutaneous q morning - 10a  . ipratropium-albuterol  3 mL Nebulization Q6H WA  . levothyroxine  88 mcg Oral QHS  . methylPREDNISolone (SOLU-MEDROL) injection  60 mg Intravenous Q6H  . metoCLOPramide  10 mg Oral BID  . morphine  30 mg Oral Q12H  . nortriptyline  25 mg Oral  QHS  . nystatin   Topical TID  . pravastatin  20 mg Oral Daily  . senna-docusate  1 tablet Oral QHS  . sodium chloride flush  3 mL Intravenous Q12H  . torsemide  20 mg Oral Daily   Continuous Infusions: . sodium chloride      Procedures/Studies: Dg Chest 2 View  Result Date: 03/21/2018 CLINICAL DATA:  Cough, congestion and shortness of breath. Former cigarette smoker. EXAM: CHEST - 2 VIEW COMPARISON:  PA and lateral chest 04/13/2017 and 02/27/2018. CT chest 02/27/2018. FINDINGS: The lungs are emphysematous. Small nodular opacities in the left apex seen on prior CT are difficult  to visualize on this examination. No consolidative process or pneumothorax is identified. Small left effusion is noted. No acute or focal bony abnormality. IMPRESSION: Small left pleural effusion. Emphysema. Atherosclerosis. Electronically Signed   By: Inge Rise M.D.   On: 03/21/2018 10:59   US Venous Img Lower Bilateral  Result Date: 04/08/2018 CLINICAL DATA:  BILATERAL lower extremity pain, morbid obesity, lower extremity edema LEFT greater than RIGHT EXAM: BILATERAL LOWER EXTREMITY VENOUS DOPPLER ULTRASOUND TECHNIQUE: Gray-scale sonography with graded compression, as well as color Doppler and duplex ultrasound were performed to evaluate the lower extremity deep venous systems from the level of the common femoral vein and including the common femoral, femoral, profunda femoral, popliteal and calf veins including the posterior tibial, peroneal and gastrocnemius veins when visible. The superficial great saphenous vein was also interrogated. Spectral Doppler was utilized to evaluate flow at rest and with distal augmentation maneuvers in the common femoral, femoral and popliteal veins. Degradation of image quality secondary to body habitus. COMPARISON:  LEFT lower extremity venous ultrasound 08/18/2016 FINDINGS: RIGHT LOWER EXTREMITY Common Femoral Vein: No evidence of thrombus. Normal compressibility, respiratory phasicity and response to augmentation. Saphenofemoral Junction: No evidence of thrombus. Normal compressibility and flow on color Doppler imaging. Profunda Femoral Vein: No evidence of thrombus. Normal compressibility and flow on color Doppler imaging. Femoral Vein: No evidence of thrombus. Normal compressibility, respiratory phasicity and response to augmentation. Popliteal Vein: No evidence of thrombus. Normal compressibility, respiratory phasicity and response to augmentation. Calf Veins: No evidence of thrombus. Normal compressibility and flow on color Doppler imaging. Superficial Great  Saphenous Vein: No evidence of thrombus. Normal compressibility. Venous Reflux:  None. Other Findings:  None. LEFT LOWER EXTREMITY Common Femoral Vein: No evidence of thrombus. Normal compressibility, respiratory phasicity and response to augmentation. Saphenofemoral Junction: No evidence of thrombus. Normal compressibility and flow on color Doppler imaging. Profunda Femoral Vein: No evidence of thrombus. Normal compressibility and flow on color Doppler imaging. Femoral Vein: No evidence of thrombus. Normal compressibility, respiratory phasicity and response to augmentation. Popliteal Vein: No evidence of thrombus. Normal compressibility, respiratory phasicity and response to augmentation. Calf Veins: No evidence of thrombus. Normal compressibility and flow on color Doppler imaging. Superficial Great Saphenous Vein: No evidence of thrombus. Normal compressibility. Venous Reflux:  None. Other Findings:  None. IMPRESSION: No evidence of deep venous thrombosis in either lower extremity. Electronically Signed   By: Lavonia Dana M.D.   On: 04/08/2018 11:59   Dg Chest Portable 1 View  Result Date: 04/07/2018 CLINICAL DATA:  Shortness of breath EXAM: PORTABLE CHEST 1 VIEW COMPARISON:  03/21/2018 FINDINGS: Chronic cardiomegaly and interstitial coarsening. There is artifact from EKG leads. Subtle opacity over the right upper lobe likely correlates with subpleural opacity on 02/27/2018 chest CT-differences from prior related to rotation. Artifact from EKG leads. IMPRESSION: No acute finding when compared to prior.  Electronically Signed   By: Monte Fantasia M.D.   On: 04/07/2018 18:27    Orson Eva, DO  [facility] Pager [phone]  If 7PM-7AM, please contact night-coverage [email or web address] Password [email or web address] 04/10/2018, 5:25 PM   LOS: 3 days

## 2018-04-10 NOTE — Evaluation (Signed)
Physical Therapy Evaluation Patient Details Name: Renee Noble MRN: [id number] DOB: 06-23-1955 Today's Date: 04/10/2018   History of Present Illness  ANVITA HIRATA is a 62 y.o. female with a history of diastolic heart failure grade 1 cardiogram 04/2017, COPD, diabetes type 2, 3 of hepatitis C status post treatment mild chronic pain, stage III chronic kidney disease with history of renal cancer status post left nephrectomy, obstructive sleep apnea, chronic cor pulmonale with chronic respiratory failure, COPD.  Patient seen for shortness of breath that has been increasing over the past week with a weight gain of approximately 20 pounds in the past week.  She was admitted approximately 1 month ago for shortness of breath associated with right lower lobe pneumonia and a COPD exacerbation.  At that time, she was taken off of her diuretic.  The patient never followed up with her nephrologist to restart her diuretics.    Clinical Impression  Patient functioning near baseline for functional mobility and gait, demonstrates labored movement with increased time for sitting up at beside, good return for use of RW for transfers and gait training without loss of balance and tolerated sitting up in chair after therapy.  Patient will benefit from continued physical therapy in hospital and recommended venue below to increase strength, balance, endurance for safe ADLs and gait.    Follow Up Recommendations Home health PT    Equipment Recommendations  Rolling walker with 5" wheels(wide heavy duty RW)    Recommendations for Other Services       Precautions / Restrictions Precautions Precautions: Fall Restrictions Weight Bearing Restrictions: No      Mobility  Bed Mobility Overal bed mobility: Modified Independent             General bed mobility comments: increased time  Transfers Overall transfer level: Needs assistance   Transfers: Sit to/from Stand;Stand Pivot Transfers Sit to Stand:  Supervision Stand pivot transfers: Supervision       General transfer comment: slightly labored  Ambulation/Gait Ambulation/Gait assistance: Supervision Gait Distance (Feet): 50 Feet Assistive device: Rolling walker (2 wheeled) Gait Pattern/deviations: Decreased step length - right;Decreased step length - left;Decreased stride length Gait velocity: decreased   General Gait Details: slightly labored slow cadence without loss of balance, limited mostly due to SOB  Stairs            Wheelchair Mobility    Modified Rankin (Stroke Patients Only)       Balance Overall balance assessment: Needs assistance Sitting-balance support: Feet supported;No upper extremity supported Sitting balance-Leahy Scale: Good     Standing balance support: During functional activity;Bilateral upper extremity supported Standing balance-Leahy Scale: Fair Standing balance comment: fair/good with RW                             Pertinent Vitals/Pain Pain Assessment: 0-10 Pain Score: 6  Pain Location: chronic low back pain Pain Descriptors / Indicators: Aching Pain Intervention(s): Limited activity within patient's tolerance;Premedicated before session;Monitored during session    [place] expects to be discharged to:: Private residence Living Arrangements: Non-relatives/Friends   Type of Home: House Home Access: Stairs to enter Entrance Stairs-Rails: Left Entrance Stairs-Number of Steps: 4 Home Layout: One level Home Equipment: Cane - quad;Shower seat Additional Comments: Patient states she needs a heavy duty walker that is wide enough for her    Prior Function Level of Independence: Independent         Comments: household and  short distanced [employer]        Extremity/Trunk Assessment   Upper Extremity Assessment Upper Extremity Assessment: Generalized weakness    Lower Extremity Assessment Lower Extremity  Assessment: Generalized weakness    Cervical / Trunk Assessment Cervical / Trunk Assessment: Normal  Communication   Communication: No difficulties  Cognition Arousal/Alertness: Awake/alert Behavior During Therapy: WFL for tasks assessed/performed Overall Cognitive Status: Within Functional Limits for tasks assessed                                        General Comments      Exercises     Assessment/Plan    PT Assessment Patient needs continued PT services  PT Problem List Decreased strength;Decreased activity tolerance;Decreased balance;Decreased mobility       PT Treatment Interventions Gait training;Stair training;Functional mobility training;Therapeutic activities;Therapeutic exercise;Patient/family education    PT Goals (Current goals can be found in the Care Plan section)  Acute Rehab PT Goals Patient Stated Goal: return home PT Goal Formulation: With patient Time For Goal Achievement: 04/17/18 Potential to Achieve Goals: Good    Frequency Min 3X/week   Barriers to discharge        Co-evaluation               AM-PAC PT "6 Clicks" Mobility  Outcome Measure Help needed turning from your back to your side while in a flat bed without using bedrails?: None Help needed moving from lying on your back to sitting on the side of a flat bed without using bedrails?: None Help needed moving to and from a bed to a chair (including a wheelchair)?: None Help needed standing up from a chair using your arms (e.g., wheelchair or bedside chair)?: A Little Help needed to walk in hospital room?: A Little Help needed climbing 3-5 steps with a railing? : A Little 6 Click Score: 21    End of Session Equipment Utilized During Treatment: Gait belt;Oxygen Activity Tolerance: Patient tolerated treatment well;Patient limited by fatigue Patient left: in chair;with call bell/phone within reach Nurse Communication: Mobility status PT Visit Diagnosis: Unsteadiness  on feet (R26.81);Other abnormalities of gait and mobility (R26.89);Muscle weakness (generalized) (M62.81)    Time: 5465-6812 PT Time Calculation (min) (ACUTE ONLY): 33 min   Charges:   PT Evaluation $PT Eval Moderate Complexity: 1 Mod PT Treatments $Therapeutic Activity: 23-37 mins        3:59 PM, 04/10/18 Lonell Grandchild, MPT Physical Therapist with [facility] [phone] office [phone] mobile phone

## 2018-04-10 NOTE — Plan of Care (Signed)
  Problem: Acute Rehab PT Goals(only PT should resolve) Goal: Pt Will Go Supine/Side To Sit Outcome: Progressing Flowsheets (Taken 04/10/2018 1600) Pt will go Supine/Side to Sit: Independently Goal: Patient Will Transfer Sit To/From Stand Outcome: Progressing Flowsheets (Taken 04/10/2018 1600) Patient will transfer sit to/from stand: with modified independence Goal: Pt Will Transfer Bed To Chair/Chair To Bed Outcome: Progressing Flowsheets (Taken 04/10/2018 1600) Pt will Transfer Bed to Chair/Chair to Bed: with modified independence Goal: Pt Will Ambulate Outcome: Progressing Flowsheets (Taken 04/10/2018 1600) Pt will Ambulate: with modified independence; 100 feet; with rolling walker   4:01 PM, 04/10/18 Lonell Grandchild, MPT Physical Therapist with [facility] [phone] office [phone] mobile phone

## 2018-04-10 NOTE — Care Management Important Message (Signed)
Important Message  Patient Details  Name: ZILDA NO MRN: [id number] Date of Birth: [birth year]   Medicare Important Message Given:  Yes    Shelda Altes 04/10/2018, 1:01 PM

## 2018-04-11 DIAGNOSIS — I509 Heart failure, unspecified: Secondary | ICD-10-CM

## 2018-04-11 LAB — BASIC METABOLIC PANEL
ANION GAP: 10 (ref 5–15)
BUN: 41 mg/dL — AB (ref 8–23)
CO2: 40 mmol/L — ABNORMAL HIGH (ref 22–32)
CREATININE: 1.59 mg/dL — AB (ref 0.44–1.00)
Calcium: 8.6 mg/dL — ABNORMAL LOW (ref 8.9–10.3)
Chloride: 88 mmol/L — ABNORMAL LOW (ref 98–111)
GFR calc non Af Amer: 33 mL/min — ABNORMAL LOW (ref >60)
GFR, EST AFRICAN AMERICAN: 39 mL/min — AB (ref >60)
Glucose, Bld: 176 mg/dL — ABNORMAL HIGH (ref 70–99)
Potassium: 4.5 mmol/L (ref 3.5–5.1)
Sodium: 138 mmol/L (ref 135–145)

## 2018-04-11 LAB — GLUCOSE, CAPILLARY
GLUCOSE-CAPILLARY: 155 mg/dL — AB (ref 70–99)
GLUCOSE-CAPILLARY: 222 mg/dL — AB (ref 70–99)

## 2018-04-11 MED ORDER — DILTIAZEM HCL ER COATED BEADS 180 MG PO CP24: 180.0000 mg | ORAL_CAPSULE | Freq: Every day | ORAL | 1 refills | Status: DC

## 2018-04-11 MED ORDER — PREDNISONE 10 MG PO TABS: 60.0000 mg | ORAL_TABLET | Freq: Every day | ORAL | 0 refills | Status: DC

## 2018-04-11 MED ORDER — TORSEMIDE 20 MG PO TABS: 20.0000 mg | ORAL_TABLET | Freq: Every day | ORAL | 1 refills | Status: DC

## 2018-04-11 MED ORDER — DILTIAZEM HCL ER COATED BEADS 180 MG PO CP24
180.0000 mg | ORAL_CAPSULE | Freq: Every day | ORAL | Status: DC
  Administered 2018-04-11: 180 mg via ORAL
  Filled 2018-04-11: qty 1

## 2018-04-11 NOTE — Discharge Summary (Signed)
Physician Discharge Summary  Renee Noble UVO:[id number] DOB: Jul 05, 1955 DOA: 04/07/2018  PCP: [name], PA-C  Admit date: 04/07/2018 Discharge date: 04/11/2018  Admitted From: Home Disposition:  Home   Recommendations for Outpatient Follow-up:  1. Follow up with PCP in 1-2 weeks 2. Please obtain BMP/CBC in one week   Home Health:YES Equipment/Devices:HHPT  Discharge Condition: Stable CODE STATUS: FULL Diet recommendation: Heart Healthy / Carb Modified   Brief/Interim Summary: 62 year old female with a history of diastolic CHF, OSA, COPD, diabetes mellitus type 2, CKD stage III, chronic pain syndrome, solitary kidney status post left nephrectomy, and chronic respiratory failure on 3 L nasal cannula presenting with 1 week history of worsening shortness of breath, increasing lower extremity edema. The patient had a mechanical fall on 04/07/2018 which prompted her to call EMS because she could not get up. She denied any syncope.Upon EMS arrival, the patient was noted to be short of breath and hypoxic with oxygen saturation in the 70s. She denies any fevers, chills, chest pain, sore throat, headache, neck pain. She has chronic cough but denies any hemoptysis. There is not been any nausea, vomiting, diarrhea. The patient was recently discharged from the hospital after stay from 02/24/2018 through 03/02/2018 during which she was treated for pneumonia and COPD exacerbation. At that time, she was instructed to discontinue torsemide and losartan because of her CKD. Her discharge weight was 258.6 pounds. She has yet to follow-up with her nephrologist or primary care provider. Upon presentation, the patient was afebrile hemodynamically stable. CBC was unremarkable with baseline hemoglobin and thrombocytopenia. BMP and LFTs were unremarkable except for serum creatinine 1.38 which was near her baseline. Chest x-ray showed vascular congestion and chronic interstitial changes.  The patient was given furosemide 20 mg IV x1 and admitted for further evaluation.She was continued on IV lasix and solumedrol with clinical improvement.  She was transitioned to po torsemide and renal function remained stable.  She will go home with prednisone taper and torsemide 20 mg daily.  Discharge Diagnoses:  Acute on chronic respiratory failure with hypoxia and hypercarbia -Secondary to CHF exacerbation and COPD exacerbation -Presently stable on 5 >>>3L -Patient is normally on 3 L nasal cannula at home -Pulmonary hygiene -Personally reviewed chest x-ray--vascular congestion/chronic interstitial changes;no consolidation  Acute on chronic diastolic CHF -74/25/9563 discharge weight 258.6 pounds-->?accurate -Admission weight 299 pounds -Continue IV furosemide>>>d/c home with torsemide 20 mg daily -renal function remained stable after transition to torsemide while in hospital -NEG 7 lbs -NEG 7.6L -discharge weight 291.9 lbs -Accurate I's and O's -Echocardiogram--60-65%, no WMA, mild AS -personally reviewed EKG--sinus nonspecific T wave changes  COPD exacerbation -ContinuePulmicort -Conitnueduo nebs -ContinueIV Solu-Medrol>>d/c home with prednisone taper -Patient has over 80-pack-year history  SVT/MAT -HR intermittently into 120s -start diltiazem  CKD stage III -Baseline creatinine 1.4-1.5 -Monitor with diuresis -serum creatinine 1.59 on day of d/c -A.m. BMP  Diabetes mellitus type 2 with gastroparesis -Hemoglobin A1c--5.2 -increase lantus to 30 units -novolog 3 units with meals -NovoLog sliding scale -Anticipate elevated CBG secondary to steroids -Continue metoclopramide  Chronic pain syndrome -Continue home dose MS Contin and oxycodone -The [place] Controlled Substance Reporting System has been queried for this patient--no red flags -Continue nortriptyline  Hyperlipidemia -Continue statin  Hypothyroidism -Continue  Synthroid  Thrombocytopenia -This appears to be chronic upon review of medical records -Monitor for signs of bleeding -Serum O75--643 -Folic PIRJ--1.8-->[id number]  Leg pain and edema -Venous duplex--neg  Morbid Obesity -BMI 50.67 -lifestyle modification   Discharge Instructions  Allergies as of 04/11/2018      Reactions   Gabapentin Anaphylaxis   Lyrica [pregabalin] Shortness Of Breath   Trouble breathing   Ketorolac Tromethamine Hives   Lisinopril Cough      Medication List    TAKE these medications   albuterol 108 (90 Base) MCG/ACT inhaler Commonly known as:  PROVENTIL HFA;VENTOLIN HFA Inhale 1-2 puffs into the lungs every 6 (six) hours as needed for wheezing or shortness of breath.   budesonide-formoterol 160-4.5 MCG/ACT inhaler Commonly known as:  SYMBICORT Inhale 2 puffs into the lungs 2 (two) times daily.   CENTRUM SILVER PO Take by mouth.   clotrimazole-betamethasone cream Commonly known as:  LOTRISONE Apply 1 application topically 2 (two) times daily as needed.   diltiazem 180 MG 24 hr capsule Commonly known as:  CARDIZEM CD Take 1 capsule (180 mg total) by mouth daily.   guaiFENesin 600 MG 12 hr tablet Commonly known as:  MUCINEX Take 1 tablet (600 mg total) by mouth 2 (two) times daily.   HUMALOG 100 UNIT/ML injection Generic drug:  insulin lispro INJECT 0.15 ML (15 UNITS) INTO THE SKIN 3 TIMES DAILY AS NEEDED FOR HIGH BLOOD SUGAR What changed:  See the new instructions.   insulin glargine 100 UNIT/ML injection Commonly known as:  LANTUS INJECT 0.3MLS (30 UNITS TOTAL) INTO THE SKIN EVERY DAY What changed:    how much to take  how to take this  when to take this   ipratropium-albuterol 0.5-2.5 (3) MG/3ML Soln Commonly known as:  DUONEB Take 3 mLs by nebulization 4 (four) times daily. Dx: J44.9   levothyroxine 88 MCG tablet Commonly known as:  SYNTHROID, LEVOTHROID Take 1 tablet (88 mcg total) by mouth daily before  breakfast. What changed:  when to take this   metoCLOPramide 10 MG tablet Commonly known as:  REGLAN Take 1 tablet (10 mg total) by mouth every 6 (six) hours as needed for nausea. What changed:  when to take this   morphine 30 MG 12 hr tablet Commonly known as:  MS CONTIN Take 30 mg by mouth every 12 (twelve) hours.   nortriptyline 25 MG capsule Commonly known as:  PAMELOR Take 1 capsule (25 mg total) by mouth at bedtime. What changed:  Another medication with the same name was removed. Continue taking this medication, and follow the directions you see here.   nystatin powder Commonly known as:  MYCOSTATIN/NYSTOP APPLY TOPICALLY TWICE DAILY AS NEEDED FOR YEAST INFECTION   oxyCODONE 5 MG immediate release tablet Commonly known as:  Oxy IR/ROXICODONE Take 5 mg by mouth 2 (two) times daily as needed for breakthrough pain. Take 1 tablet by mouth twice daily as needed   OXYGEN O2 3L at home.   pravastatin 20 MG tablet Commonly known as:  PRAVACHOL TAKE ONE TABLET BY MOUTH EVERY DAY   predniSONE 10 MG tablet Commonly known as:  DELTASONE Take 6 tablets (60 mg total) by mouth daily with breakfast. And decrease by one tablet every 2 days Start taking on:  04/12/2018   PROBIOTIC PO Take by mouth.   senna-docusate 8.6-50 MG tablet Commonly known as:  Senokot-S Take 1 tablet by mouth at bedtime.   SURE COMFORT INSULIN SYRINGE 30G X 1/2" 1 ML Misc Generic drug:  Insulin Syringe-Needle U-100 USE FOUR (4) TIMES DAILY AS DIRECTED What changed:  See the new instructions.   tiZANidine 2 MG tablet Commonly known as:  ZANAFLEX Take 2-4 mg by mouth every 12 (twelve) hours as needed  for muscle spasms.   torsemide 20 MG tablet Commonly known as:  DEMADEX Take 1 tablet (20 mg total) by mouth daily. Start taking on:  04/12/2018   varenicline 0.5 MG X 11 & 1 MG X 42 tablet Commonly known as:  CHANTIX PAK Take one 0.5 mg tablet by mouth QD x 3 days, then increase to one 0.5 mg  tablet BID x 4 days, then increase to one 1 mg tablet BID.            Durable Medical Equipment  (From admission, onward)         Start     Ordered   04/10/18 1917  For home use only DME Walker rolling  Once    Comments:  Wide/Bariatric walker  Question:  Patient needs a walker to treat with the following condition  Answer:  General weakness   04/10/18 1917          Allergies  Allergen Reactions  . Gabapentin Anaphylaxis  . Lyrica [Pregabalin] Shortness Of Breath    Trouble breathing  . Ketorolac Tromethamine Hives  . Lisinopril Cough    Consultations:  none   Procedures/Studies: Dg Chest 2 View  Result Date: 03/21/2018 CLINICAL DATA:  Cough, congestion and shortness of breath. Former cigarette smoker. EXAM: CHEST - 2 VIEW COMPARISON:  PA and lateral chest 04/13/2017 and 02/27/2018. CT chest 02/27/2018. FINDINGS: The lungs are emphysematous. Small nodular opacities in the left apex seen on prior CT are difficult to visualize on this examination. No consolidative process or pneumothorax is identified. Small left effusion is noted. No acute or focal bony abnormality. IMPRESSION: Small left pleural effusion. Emphysema. Atherosclerosis. Electronically Signed   By: Inge Rise M.D.   On: 03/21/2018 10:59   US Venous Img Lower Bilateral  Result Date: 04/08/2018 CLINICAL DATA:  BILATERAL lower extremity pain, morbid obesity, lower extremity edema LEFT greater than RIGHT EXAM: BILATERAL LOWER EXTREMITY VENOUS DOPPLER ULTRASOUND TECHNIQUE: Gray-scale sonography with graded compression, as well as color Doppler and duplex ultrasound were performed to evaluate the lower extremity deep venous systems from the level of the common femoral vein and including the common femoral, femoral, profunda femoral, popliteal and calf veins including the posterior tibial, peroneal and gastrocnemius veins when visible. The superficial great saphenous vein was also interrogated. Spectral Doppler  was utilized to evaluate flow at rest and with distal augmentation maneuvers in the common femoral, femoral and popliteal veins. Degradation of image quality secondary to body habitus. COMPARISON:  LEFT lower extremity venous ultrasound 08/18/2016 FINDINGS: RIGHT LOWER EXTREMITY Common Femoral Vein: No evidence of thrombus. Normal compressibility, respiratory phasicity and response to augmentation. Saphenofemoral Junction: No evidence of thrombus. Normal compressibility and flow on color Doppler imaging. Profunda Femoral Vein: No evidence of thrombus. Normal compressibility and flow on color Doppler imaging. Femoral Vein: No evidence of thrombus. Normal compressibility, respiratory phasicity and response to augmentation. Popliteal Vein: No evidence of thrombus. Normal compressibility, respiratory phasicity and response to augmentation. Calf Veins: No evidence of thrombus. Normal compressibility and flow on color Doppler imaging. Superficial Great Saphenous Vein: No evidence of thrombus. Normal compressibility. Venous Reflux:  None. Other Findings:  None. LEFT LOWER EXTREMITY Common Femoral Vein: No evidence of thrombus. Normal compressibility, respiratory phasicity and response to augmentation. Saphenofemoral Junction: No evidence of thrombus. Normal compressibility and flow on color Doppler imaging. Profunda Femoral Vein: No evidence of thrombus. Normal compressibility and flow on color Doppler imaging. Femoral Vein: No evidence of thrombus. Normal compressibility, respiratory phasicity and  response to augmentation. Popliteal Vein: No evidence of thrombus. Normal compressibility, respiratory phasicity and response to augmentation. Calf Veins: No evidence of thrombus. Normal compressibility and flow on color Doppler imaging. Superficial Great Saphenous Vein: No evidence of thrombus. Normal compressibility. Venous Reflux:  None. Other Findings:  None. IMPRESSION: No evidence of deep venous thrombosis in either lower  extremity. Electronically Signed   By: Lavonia Dana M.D.   On: 04/08/2018 11:59   Dg Chest Portable 1 View  Result Date: 04/07/2018 CLINICAL DATA:  Shortness of breath EXAM: PORTABLE CHEST 1 VIEW COMPARISON:  03/21/2018 FINDINGS: Chronic cardiomegaly and interstitial coarsening. There is artifact from EKG leads. Subtle opacity over the right upper lobe likely correlates with subpleural opacity on 02/27/2018 chest CT-differences from prior related to rotation. Artifact from EKG leads. IMPRESSION: No acute finding when compared to prior. Electronically Signed   By: Monte Fantasia M.D.   On: 04/07/2018 18:27         Discharge Exam: Vitals:   04/11/18 0513 04/11/18 0740  BP: (!) 145/76   Pulse: 93   Resp:    Temp: 97.6 F (36.4 C)   SpO2: 94% 90%   Vitals:   04/10/18 2016 04/10/18 2203 04/11/18 0513 04/11/18 0740  BP:  (!) 164/92 (!) 145/76   Pulse:  (!) 102 93   Resp:      Temp:  97.9 F (36.6 C) 97.6 F (36.4 C)   TempSrc:  Oral Oral   SpO2: 93% 93% 94% 90%  Weight:   132.4 kg   Height:        General: Pt is alert, awake, not in acute distress Cardiovascular: RRR, S1/S2 +, no rubs, no gallops Respiratory: bibasilar rales.  Minimal basilar wheeze Abdominal: Soft, NT, ND, bowel sounds + Extremities: 1 +LE edema, no cyanosis   The results of significant diagnostics from this hospitalization (including imaging, microbiology, ancillary and laboratory) are listed below for reference.    Significant Diagnostic Studies: Dg Chest 2 View  Result Date: 03/21/2018 CLINICAL DATA:  Cough, congestion and shortness of breath. Former cigarette smoker. EXAM: CHEST - 2 VIEW COMPARISON:  PA and lateral chest 04/13/2017 and 02/27/2018. CT chest 02/27/2018. FINDINGS: The lungs are emphysematous. Small nodular opacities in the left apex seen on prior CT are difficult to visualize on this examination. No consolidative process or pneumothorax is identified. Small left effusion is noted. No  acute or focal bony abnormality. IMPRESSION: Small left pleural effusion. Emphysema. Atherosclerosis. Electronically Signed   By: Inge Rise M.D.   On: 03/21/2018 10:59   US Venous Img Lower Bilateral  Result Date: 04/08/2018 CLINICAL DATA:  BILATERAL lower extremity pain, morbid obesity, lower extremity edema LEFT greater than RIGHT EXAM: BILATERAL LOWER EXTREMITY VENOUS DOPPLER ULTRASOUND TECHNIQUE: Gray-scale sonography with graded compression, as well as color Doppler and duplex ultrasound were performed to evaluate the lower extremity deep venous systems from the level of the common femoral vein and including the common femoral, femoral, profunda femoral, popliteal and calf veins including the posterior tibial, peroneal and gastrocnemius veins when visible. The superficial great saphenous vein was also interrogated. Spectral Doppler was utilized to evaluate flow at rest and with distal augmentation maneuvers in the common femoral, femoral and popliteal veins. Degradation of image quality secondary to body habitus. COMPARISON:  LEFT lower extremity venous ultrasound 08/18/2016 FINDINGS: RIGHT LOWER EXTREMITY Common Femoral Vein: No evidence of thrombus. Normal compressibility, respiratory phasicity and response to augmentation. Saphenofemoral Junction: No evidence of thrombus. Normal compressibility and  flow on color Doppler imaging. Profunda Femoral Vein: No evidence of thrombus. Normal compressibility and flow on color Doppler imaging. Femoral Vein: No evidence of thrombus. Normal compressibility, respiratory phasicity and response to augmentation. Popliteal Vein: No evidence of thrombus. Normal compressibility, respiratory phasicity and response to augmentation. Calf Veins: No evidence of thrombus. Normal compressibility and flow on color Doppler imaging. Superficial Great Saphenous Vein: No evidence of thrombus. Normal compressibility. Venous Reflux:  None. Other Findings:  None. LEFT LOWER  EXTREMITY Common Femoral Vein: No evidence of thrombus. Normal compressibility, respiratory phasicity and response to augmentation. Saphenofemoral Junction: No evidence of thrombus. Normal compressibility and flow on color Doppler imaging. Profunda Femoral Vein: No evidence of thrombus. Normal compressibility and flow on color Doppler imaging. Femoral Vein: No evidence of thrombus. Normal compressibility, respiratory phasicity and response to augmentation. Popliteal Vein: No evidence of thrombus. Normal compressibility, respiratory phasicity and response to augmentation. Calf Veins: No evidence of thrombus. Normal compressibility and flow on color Doppler imaging. Superficial Great Saphenous Vein: No evidence of thrombus. Normal compressibility. Venous Reflux:  None. Other Findings:  None. IMPRESSION: No evidence of deep venous thrombosis in either lower extremity. Electronically Signed   By: Lavonia Dana M.D.   On: 04/08/2018 11:59   Dg Chest Portable 1 View  Result Date: 04/07/2018 CLINICAL DATA:  Shortness of breath EXAM: PORTABLE CHEST 1 VIEW COMPARISON:  03/21/2018 FINDINGS: Chronic cardiomegaly and interstitial coarsening. There is artifact from EKG leads. Subtle opacity over the right upper lobe likely correlates with subpleural opacity on 02/27/2018 chest CT-differences from prior related to rotation. Artifact from EKG leads. IMPRESSION: No acute finding when compared to prior. Electronically Signed   By: Monte Fantasia M.D.   On: 04/07/2018 18:27     Microbiology: No results found for this or any previous visit (from the past 240 hour(s)).   Labs: Basic Metabolic Panel: Recent Labs  Lab 04/07/18 1642 04/08/18 0645 04/09/18 0538 04/10/18 0546 04/11/18 0509  NA 138 140 139 138 138  K 4.4 4.3 4.2 5.1 4.5  CL 93* 95* 90* 89* 88*  CO2 38* 41* 40* 40* 40*  GLUCOSE 156* 114* 260* 210* 176*  BUN 16 16 23  29* 41*  CREATININE 1.38* 1.35* 1.58* 1.37* 1.59*  CALCIUM 8.2* 8.1* 8.5* 8.7* 8.6*   MG  --   --  1.7  --   --    Liver Function Tests: Recent Labs  Lab 04/07/18 1642  AST 13*  ALT 10  ALKPHOS 46  BILITOT 0.8  PROT 5.8*  ALBUMIN 3.1*   No results for input(s): LIPASE, AMYLASE in the last 168 hours. No results for input(s): AMMONIA in the last 168 hours. CBC: Recent Labs  Lab 04/07/18 1642  WBC 4.5  NEUTROABS 3.7  HGB 9.6*  HCT 35.9*  MCV 89.3  PLT 49*   Cardiac Enzymes: Recent Labs  Lab 04/07/18 1642  TROPONINI 0.03*   BNP: Invalid input(s): POCBNP CBG: Recent Labs  Lab 04/10/18 1138 04/10/18 1658 04/10/18 2219 04/11/18 0730 04/11/18 1109  GLUCAP 206* 172* 182* 155* 222*    Time coordinating discharge:  36 minutes  Signed:  Orson Eva, DO [facility] Pager: [phone] 04/11/2018, 11:40 AM

## 2018-04-11 NOTE — Progress Notes (Signed)
Removed IV-clean, dry, intact. Reviewed d/c paperwork with patient. Reviewed new medications and changes. Answered all questions. [place] wheeled stable patient and belongings to main entrance where she was picked up by her friend.

## 2018-04-11 NOTE — Progress Notes (Signed)
Chart stated that patient had a legal guardian. I checked her chart and admission documentation said she did not have a legal guardian.

## 2018-04-11 NOTE — Progress Notes (Signed)
OT Cancellation Note  Patient Details Name: Renee Noble MRN: [id number] DOB: 03-02-1956   Cancelled Treatment:    Reason Eval/Treat Not Completed: OT screened, no needs identified, will sign off. Pt is at baseline for ADL completion, requiring increased time and rest breaks due to SOB and fatigue. Educated pt on AE including reacher and sock aid. Pt already has sock aid at home and uses for ADL completion. Pt is familiar with energy conservation strategies and employs. Pt will have family to assist as needed on discharge home, no further OT services required at this time.   Guadelupe Sabin, OTR/L  [phone] 04/11/2018, 8:54 AM

## 2018-04-11 NOTE — Care Management Note (Signed)
Case Management Note  Patient Details  Name: Renee Noble MRN: [id number] Date of Birth: 12/15/1955  Subjective/Objective:       Admitted with CHF/COPD. Pt from home, lives with her cousin and cousin's husband. Pt ind at baseline, has home O2 and neb machine. Has cane she uses PRN. This is patiens' second admission in past 6 months. Pt needs HH and has no preference of [facility] providers.              Action/Plan: DC home today with referral for [facility]. CM has sent referral to [facility], [place], [facility] and [facility]. [facility] the only [facility] accept [facility] for nursing and PT services. They will not be able to admit pt until next week. Pt understands and is okay with this.   Expected Discharge Date:  04/11/18               Expected Discharge Plan:  [place]  In-House Referral:  NA  Discharge planning Services  CM Consult  Post Acute Care Choice:  Home Health Choice offered to:  NA  HH Arranged:  RN, PT [place] Agency:  [facility]  Status of Service:  Completed, signed off  Sherald Barge, RN 04/11/2018, 1:40 PM

## 2018-04-12 ENCOUNTER — Telehealth: Payer: Self-pay

## 2018-04-12 NOTE — Telephone Encounter (Signed)
Transition Care Management Follow-up Telephone Call  Admit date: 04/07/2018 Discharge date: 04/11/2018 DX: Acute on chronic respiratory failure with hypoxia and hypercarbia   How have you been since you were released from the hospital? "I'm okay"   Do you understand why you were in the hospital? yes, "fluid overload"   Do you understand the discharge instructions? yes   Where were you discharged to? Home. [name] (cousine) with patient.    Items Reviewed:  Medications reviewed: yes  Allergies reviewed: yes  Dietary changes reviewed: yes  Referrals reviewed: yes. HH ordered upon hosp discharge, to have first visit next week for PT   Functional Questionnaire:   Activities of Daily Living (ADLs):   She states they are independent in the following: None.  States they require assistance with the following: States she needs assistance with ADLs currently.    Any transportation issues/concerns?: no   Any patient concerns? no   Confirmed importance and date/time of follow-up visits scheduled yes  Provider Appointment booked with PCP 04/18/18 @ 11am.   Confirmed with patient if condition begins to worsen call PCP or go to the ER.  Patient was given the office number and encouraged to call back with question or concerns.  : yes

## 2018-04-17 ENCOUNTER — Other Ambulatory Visit: Payer: Self-pay | Admitting: *Deleted

## 2018-04-17 NOTE — Patient Outreach (Signed)
[facility]) Care Management  04/17/2018  AAMANI MOOSE [birth year] [id number]   EMMI-general discharge AP RED ON EMMI ALERT Day # 4 Date: Sunday 04/16/18 1039 Red Alert Reason: Lost interest in things? Yes  Sad/hopeless/anxious/empty? yes   Transition of care services noted to be completed by primary care MD office staff-[place] Pt confirmed [place] called for transition of care  Insurance:  [employer] medicare  [facility] admissions x 2 ED visits x 2 in the last 6 months    Outreach attempt # 1 successful at her home number  Patient is able to verify HIPAA [facility] Care Management RN reviewed and addressed red alert with patient Mrs Kinser states the answers to the EMMI questions are correct but "There is nothing anyone can do to help right now." CM reviewed [facility] resources and availability for counseling, depression resources, etc She confirms she is on pamelor and tolerating well She states she does have increase anxiety on today and she is concern that it is related to one of her new medications.  She wishes to discuss this with her MD on her F/u appt  [facility] RN CM and the patient reviewed her medication on her after summary sheet and discussed Cardizem and albuterol effects Cm discussed increased use of albuterol will cause increase nervousness. She confirms that she has been using her albuterol nebulizer "a lot more"  Cm cautioned her on the increase use and referred her to her MD for a plan of care with breakthrough needs related to COPD s/s She voiced understanding and appreciation for the call and information discussed  CM encouraged calls to CM and 24 hour RN line She confirms she is familiar with the 24 hour nurse    Social: Mrs Holquin lives with her cousin [name] that assists in her care The patient is independent with some assist with ADLs, iADLs and transportation    Conditions: Acute hypoxia, HTN, CHF, COPD, PNA, DM insulin dependent , CKD, hernia, HDL,  morbid obeisity, OSA , chroinc pain syndrome cor pulmonale (chroinc)    Medications: see above  denies concerns with affording medications  Appointments: has upcoming fu with MD    Advance Directives: has a POA    Consent: [facility] RN CM reviewed [facility] services with patient. Patient gave verbal consent for services.   Advised patient that there will be further automated EMMI- post discharge calls to assess how the patient is doing following the recent hospitalization Advised the patient that another call may be received from a nurse if any of their responses were abnormal. Patient voiced understanding and was appreciative of f/u call.   Plan: [facility] RN CM will close case at this time as patient has been assessed and no needs identified.    Pt encouraged to return a call to [facility] RN CM prn   Randa Riss L. Lavina Hamman, RN, BSN, [place] Coordinator Office number [phone] Mobile number [phone]  Main [facility] number [phone] Fax number [phone]

## 2018-04-18 ENCOUNTER — Ambulatory Visit (HOSPITAL_BASED_OUTPATIENT_CLINIC_OR_DEPARTMENT_OTHER)
Admission: RE | Admit: 2018-04-18 | Discharge: 2018-04-18 | Disposition: A | Discharge status: Home / Self Care | Payer: Medicare Other | Source: Ambulatory Visit | Attending: Physician Assistant | Admitting: Physician Assistant

## 2018-04-18 ENCOUNTER — Telehealth: Payer: Self-pay | Admitting: Emergency Medicine

## 2018-04-18 ENCOUNTER — Encounter: Payer: Self-pay | Admitting: Physician Assistant

## 2018-04-18 ENCOUNTER — Other Ambulatory Visit: Payer: Self-pay

## 2018-04-18 ENCOUNTER — Ambulatory Visit (INDEPENDENT_AMBULATORY_CARE_PROVIDER_SITE_OTHER): Payer: Medicare Other | Admitting: Physician Assistant

## 2018-04-18 VITALS — BP 130/70 | HR 88 | Temp 98.4°F | Resp 16 | Ht 64.0 in | Wt 282.0 lb

## 2018-04-18 DIAGNOSIS — J9611 Chronic respiratory failure with hypoxia: Secondary | ICD-10-CM

## 2018-04-18 DIAGNOSIS — I10 Essential (primary) hypertension: Secondary | ICD-10-CM | POA: Diagnosis not present

## 2018-04-18 DIAGNOSIS — J9612 Chronic respiratory failure with hypercapnia: Secondary | ICD-10-CM

## 2018-04-18 DIAGNOSIS — I5033 Acute on chronic diastolic (congestive) heart failure: Secondary | ICD-10-CM | POA: Diagnosis not present

## 2018-04-18 DIAGNOSIS — I808 Phlebitis and thrombophlebitis of other sites: Secondary | ICD-10-CM | POA: Insufficient documentation

## 2018-04-18 DIAGNOSIS — M7989 Other specified soft tissue disorders: Secondary | ICD-10-CM | POA: Insufficient documentation

## 2018-04-18 DIAGNOSIS — I8002 Phlebitis and thrombophlebitis of superficial vessels of left lower extremity: Secondary | ICD-10-CM | POA: Diagnosis not present

## 2018-04-18 DIAGNOSIS — N183 Chronic kidney disease, stage 3 unspecified: Secondary | ICD-10-CM

## 2018-04-18 DIAGNOSIS — G894 Chronic pain syndrome: Secondary | ICD-10-CM

## 2018-04-18 LAB — COMPREHENSIVE METABOLIC PANEL
ALBUMIN: 3.8 g/dL (ref 3.5–5.2)
ALK PHOS: 49 U/L (ref 39–117)
ALT: 29 U/L (ref 0–35)
AST: 16 U/L (ref 0–37)
BUN: 38 mg/dL — ABNORMAL HIGH (ref 6–23)
CO2: 42 mEq/L — ABNORMAL HIGH (ref 19–32)
Calcium: 9.2 mg/dL (ref 8.4–10.5)
Chloride: 91 mEq/L — ABNORMAL LOW (ref 96–112)
Creatinine, Ser: 1.2 mg/dL (ref 0.40–1.20)
GFR: 47.96 mL/min — AB (ref >60.00)
Glucose, Bld: 76 mg/dL (ref 70–99)
Potassium: 4.6 mEq/L (ref 3.5–5.1)
Sodium: 138 mEq/L (ref 135–145)
Total Bilirubin: 0.6 mg/dL (ref 0.2–1.2)
Total Protein: 6.2 g/dL (ref 6.0–8.3)

## 2018-04-18 LAB — CBC WITH DIFFERENTIAL/PLATELET
Basophils Absolute: 0 10*3/uL (ref 0.0–0.1)
Basophils Relative: 0.2 % (ref 0.0–3.0)
Eosinophils Absolute: 0 10*3/uL (ref 0.0–0.7)
Eosinophils Relative: 0.5 % (ref 0.0–5.0)
HCT: 37.8 % (ref 36.0–46.0)
HEMOGLOBIN: 11.8 g/dL — AB (ref 12.0–15.0)
LYMPHS ABS: 0.5 10*3/uL — AB (ref 0.7–4.0)
Lymphocytes Relative: 4.9 % — ABNORMAL LOW (ref 12.0–46.0)
MCHC: 31.1 g/dL (ref 30.0–36.0)
MCV: 79.4 fl (ref 78.0–100.0)
MONOS PCT: 4.2 % (ref 3.0–12.0)
Monocytes Absolute: 0.4 10*3/uL (ref 0.1–1.0)
Neutro Abs: 8.5 10*3/uL — ABNORMAL HIGH (ref 1.4–7.7)
Neutrophils Relative %: 90.2 % — ABNORMAL HIGH (ref 43.0–77.0)
Platelets: 87 10*3/uL — ABNORMAL LOW (ref 150.0–400.0)
RBC: 4.77 Mil/uL (ref 3.87–5.11)
RDW: 18.9 % — ABNORMAL HIGH (ref 11.5–15.5)
WBC: 9.5 10*3/uL (ref 4.0–10.5)

## 2018-04-18 NOTE — Patient Instructions (Addendum)
Please go to the lab today for blood work.  I will call you with your results. We will alter treatment regimen(s) if indicated by your results.   Speak with Levada Dy to schedule your [place] for today. I will call you with your results before they let you leave.   Continue the steroid taper and the Torsemide as directed. Continue checking daily weight.  You will be contacted for assessment with Cardiology and Pulmonary rehabilitation.  Please call your Pulmonologist to schedule a hospital follow-up ASAP. (Does not need to wait until January) Follow-up with your kidney specialist as scheduled.  I want you to make sure to take 10 deep breaths through your spirometer every hour you are awake to help clear this congestion. Continue Mucinex as directed.   ER for any worsening symptoms.

## 2018-04-18 NOTE — Progress Notes (Signed)
Patient presents to clinic today for hospitalization follow-up. Patient presented to ER on 04/07/18 via EMS after they were called due to patients fall and noted her to be hypoxic with her home O2. ER assessment included labs revealing baseline hgb and thrombocytopenia, elevation in creatinine to 1.38 (around her baseline). CXR revealed vascular congestion and chronic interstitial changes. Patient was kept on oxygen, given IV furosemide and admitted for further evaluation.  During hospitalization, further workup was unremarkable. She was continued to be diuresed with IV Lasix and given IV solumedrol to help with acute respiratory failure 2/2 CHF. Echo obtained during hospitalization revealed EF at 60-65% with no WMA and mild aortic stenosis. Was stabilized and adequately diuresed in hospital. Was discharged home on steroid taper and home Demadex.  Since discharge, patient endorses following hospitalist directions. Is taking all medications as directed. Has kept an eye on her output. Notes weight has continued to decline. Is limiting her intake and notes good output. Breathing is much improved. She is almost back down to her baseline 2-3L at home with good sats. Still noting very easily fatigued with walking. Has not had follow-up with her Pulmonologist or Nephrologist.  Has also noted some swelling in her LUE over the past few days with some tightness but no major pain. Does note she sleeps on that side so is wondering if it is similar to her leg swelling 2/2 gravity.   Past Medical History:  Diagnosis Date  . Abdominal mass of other site   . Cervical compression fracture ([place])   . CHF (congestive heart failure) ([place])   . Chronic kidney disease, stage 3 (HCC)    Borderline Stage 2-3  . Chronic lower limb pain   . COPD (chronic obstructive pulmonary disease) ([place])   . CTS (carpal tunnel syndrome)   . Depression   . Diabetes ([place])    Type II  . Fibromyalgia   . Hepatitis C    cured last year  (2018)  . Hypercholesteremia   . Hypertension   . Hypothyroidism   . Morbid obesity ([place])   . Neuropathy    Diabetes  . OSA (obstructive sleep apnea)   . Osteoarthritis   . Renal cancer ([place])    Left Kidney Removed  . RLS (restless legs syndrome)   . Syncope   . Venous stasis     Current Outpatient Medications on File Prior to Visit  Medication Sig Dispense Refill  . albuterol (PROAIR HFA) 108 (90 Base) MCG/ACT inhaler Inhale 1-2 puffs into the lungs every 6 (six) hours as needed for wheezing or shortness of breath. 1 Inhaler 5  . budesonide-formoterol (SYMBICORT) 160-4.5 MCG/ACT inhaler Inhale 2 puffs into the lungs 2 (two) times daily. 1 Inhaler 5  . clotrimazole-betamethasone (LOTRISONE) cream Apply 1 application topically 2 (two) times daily as needed. 45 g 0  . diltiazem (CARDIZEM CD) 180 MG 24 hr capsule Take 1 capsule (180 mg total) by mouth daily. 30 capsule 1  . guaiFENesin (MUCINEX) 600 MG 12 hr tablet Take 1 tablet (600 mg total) by mouth 2 (two) times daily. 20 tablet 0  . HUMALOG 100 UNIT/ML injection INJECT 0.15 ML (15 UNITS) INTO THE SKIN 3 TIMES DAILY AS NEEDED FOR HIGH BLOOD SUGAR (Patient taking differently: Inject 15 Units into the skin 3 (three) times daily as needed for high blood sugar. Uses sliding scale. Sugar over 200 will give insulin.) 10 mL 0  . insulin glargine (LANTUS) 100 UNIT/ML injection INJECT 0.3MLS (30 UNITS TOTAL)  INTO THE SKIN EVERY DAY (Patient taking differently: Inject 30 Units into the skin every morning. INJECT 0.3MLS (30 UNITS TOTAL) INTO THE SKIN EVERY DAY) 30 mL 3  . ipratropium-albuterol (DUONEB) 0.5-2.5 (3) MG/3ML SOLN Take 3 mLs by nebulization 4 (four) times daily. Dx: J44.9 360 mL 5  . levothyroxine (SYNTHROID, LEVOTHROID) 88 MCG tablet Take 1 tablet (88 mcg total) by mouth daily before breakfast. (Patient taking differently: Take 88 mcg by mouth at bedtime. ) 90 tablet 1  . metoCLOPramide (REGLAN) 10 MG tablet Take 1 tablet (10 mg total)  by mouth every 6 (six) hours as needed for nausea. (Patient taking differently: Take 10 mg by mouth 2 (two) times daily. ) 120 tablet 1  . morphine (MS CONTIN) 30 MG 12 hr tablet Take 30 mg by mouth every 12 (twelve) hours.  0  . Multiple Vitamins-Minerals (CENTRUM SILVER PO) Take by mouth.    . nortriptyline (PAMELOR) 25 MG capsule Take 1 capsule (25 mg total) by mouth at bedtime. 90 capsule 1  . nystatin (MYCOSTATIN/NYSTOP) powder APPLY TOPICALLY TWICE DAILY AS NEEDED FOR YEAST INFECTION 15 g 2  . oxyCODONE (OXY IR/ROXICODONE) 5 MG immediate release tablet Take 5 mg by mouth 2 (two) times daily as needed for breakthrough pain. Take 1 tablet by mouth twice daily as needed    . OXYGEN O2 3L at home.    . pravastatin (PRAVACHOL) 20 MG tablet TAKE ONE TABLET BY MOUTH EVERY DAY 90 tablet 1  . predniSONE (DELTASONE) 10 MG tablet Take 6 tablets (60 mg total) by mouth daily with breakfast. And decrease by one tablet every 2 days 42 tablet 0  . Probiotic Product (PROBIOTIC PO) Take by mouth.    . senna-docusate (SENOKOT-S) 8.6-50 MG tablet Take 1 tablet by mouth at bedtime. 20 tablet 0  . SURE COMFORT INSULIN SYRINGE 30G X 1/2" 1 ML MISC USE FOUR (4) TIMES DAILY AS DIRECTED (Patient taking differently: Inject 1 Syringe into the skin 4 (four) times daily. ) 100 each 3  . tiZANidine (ZANAFLEX) 2 MG tablet Take 2-4 mg by mouth every 12 (twelve) hours as needed for muscle spasms.    [name] Kitchen torsemide (DEMADEX) 20 MG tablet Take 1 tablet (20 mg total) by mouth daily. 30 tablet 1  . varenicline (CHANTIX PAK) 0.5 MG X 11 & 1 MG X 42 tablet Take one 0.5 mg tablet by mouth QD x 3 days, then increase to one 0.5 mg tablet BID x 4 days, then increase to one 1 mg tablet BID. 53 tablet 0   No current facility-administered medications on file prior to visit.     Allergies  Allergen Reactions  . Gabapentin Anaphylaxis  . Lyrica [Pregabalin] Shortness Of Breath    Trouble breathing  . Ketorolac Tromethamine Hives  .  Lisinopril Cough    Family History  Problem Relation Age of Onset  . Heart attack Mother 63       Deceased  . Heart disease Mother   . Emphysema Mother   . Alcoholism Mother   . COPD Father 74       Deceased  . Emphysema Father   . Alcoholism Father   . Esophageal varices Father   . Alcoholism Paternal Grandfather   . Diabetes Maternal Grandmother   . Heart disease Maternal Grandmother   . Lung cancer Maternal Grandfather   . Emphysema Maternal Grandfather   . Brain cancer Maternal Aunt   . Diabetes Sister   . Heart defect Sister   .  Cancer Sister        was told her sister had cancer but beat it and dont know which one   . Heart defect Sister   . Obesity Son   . Breast cancer Maternal Aunt   . Colon cancer Neg Hx   . Esophageal cancer Neg Hx     Social History   Socioeconomic History  . Marital status: Widowed    Spouse name: Not on file  . Number of children: 1  . Years of education: Not on file  . Highest education level: Not on file  Occupational History  . Occupation: Retired  Scientific laboratory technician  . Financial resource strain: Not on file  . Food insecurity:    Worry: Not on file    Inability: Not on file  . Transportation needs:    Medical: Not on file    Non-medical: Not on file  Tobacco Use  . Smoking status: Former Smoker    Packs/day: 2.50    Years: 45.00    Pack years: 112.50    Types: Cigarettes    Last attempt to quit: 07/21/2016    Years since quitting: 1.7  . Smokeless tobacco: Never Used  Substance and Sexual Activity  . Alcohol use: No  . Drug use: No  . Sexual activity: Never  Lifestyle  . Physical activity:    Days per week: Not on file    Minutes per session: Not on file  . Stress: Not on file  Relationships  . Social connections:    Talks on phone: Not on file    Gets together: Not on file    Attends religious service: Not on file    Active member of club or organization: Not on file    Attends meetings of clubs or organizations:  Not on file    Relationship status: Not on file  Other Topics Concern  . Not on file  Social History Narrative  . Not on file   Review of Systems - See HPI.  All other ROS are negative.  BP 130/70   Pulse 88   Temp 98.4 F (36.9 C) (Oral)   Resp 16   Ht '5\' 4"'$  (1.626 m)   Wt 282 lb (127.9 kg)   SpO2 98% Comment: 3 L O2  BMI 48.41 kg/m   Physical Exam  Constitutional: She appears well-developed and well-nourished.  HENT:  Head: Normocephalic and atraumatic.  Right Ear: External ear normal.  Left Ear: External ear normal.  Nose: Nose normal.  Mouth/Throat: Oropharynx is clear and moist.  Eyes: Conjunctivae and EOM are normal.  Neck: Neck supple.  Cardiovascular: Normal rate, regular rhythm, normal heart sounds and intact distal pulses.  1+ pitting edema of lower extremities bilaterally.   Pulmonary/Chest: Effort normal. No stridor. No respiratory distress. She has wheezes (faint - lung bases bilaterally). She has no rales. She exhibits no tenderness.  Psychiatric: She has a normal mood and affect.  Vitals reviewed.   Recent Results (from the past 2160 hour(s))  Basic metabolic panel     Status: Abnormal   Collection Time: 02/24/18  5:28 PM  Result Value Ref Range   Sodium 146 (H) 135 - 145 mmol/L   Potassium 4.0 3.5 - 5.1 mmol/L   Chloride 98 98 - 111 mmol/L   CO2 37 (H) 22 - 32 mmol/L   Glucose, Bld 62 (L) 70 - 99 mg/dL   BUN 25 (H) 8 - 23 mg/dL   Creatinine, Ser 1.48 (H) 0.44 -  1.00 mg/dL   Calcium 9.2 8.9 - 10.3 mg/dL   GFR calc non Af Amer 36 (L) >60 mL/min   GFR calc Af Amer 42 (L) >60 mL/min    Comment: (NOTE) The eGFR has been calculated using the CKD EPI equation. This calculation has not been validated in all clinical situations. eGFR's persistently <60 mL/min signify possible Chronic Kidney Disease.    Anion gap 11 5 - 15    Comment: Performed at [facility], [place] [address], [place], [place] [address]  CBC with  Differential/Platelet     Status: Abnormal   Collection Time: 02/24/18  5:28 PM  Result Value Ref Range   WBC 6.1 4.0 - 10.5 K/uL   RBC 3.82 (L) 3.87 - 5.11 MIL/uL   Hemoglobin 9.6 (L) 12.0 - 15.0 g/dL   HCT 34.8 (L) 36.0 - 46.0 %   MCV 91.1 80.0 - 100.0 fL   MCH 25.1 (L) 26.0 - 34.0 pg   MCHC 27.6 (L) 30.0 - 36.0 g/dL   RDW 17.1 (H) 11.5 - 15.5 %   Platelets 85 (L) 150 - 400 K/uL    Comment: REPEATED TO VERIFY PLATELET COUNT CONFIRMED BY SMEAR SPECIMEN CHECKED FOR CLOTS Immature Platelet Fraction may be clinically indicated, consider ordering this additional test [id number]    nRBC 0.0 0.0 - 0.2 %   Neutrophils Relative % 78 %   Neutro Abs 4.8 1.7 - 7.7 K/uL   Lymphocytes Relative 12 %   Lymphs Abs 0.7 0.7 - 4.0 K/uL   Monocytes Relative 8 %   Monocytes Absolute 0.5 0.1 - 1.0 K/uL   Eosinophils Relative 1 %   Eosinophils Absolute 0.1 0.0 - 0.5 K/uL   Basophils Relative 0 %   Basophils Absolute 0.0 0.0 - 0.1 K/uL   Immature Granulocytes 1 %   Abs Immature Granulocytes 0.06 0.00 - 0.07 K/uL    Comment: Performed at [facility], [place] [address], [place], [place] [address]  Type and screen [place]     Status: None   Collection Time: 02/24/18 10:31 PM  Result Value Ref Range   ABO/RH(D) A POS    Antibody Screen NEG    Sample Expiration      02/27/2018 Performed at [facility], [place] [address], [place], [place] [address]   ABO/Rh     Status: None   Collection Time: 02/24/18 10:31 PM  Result Value Ref Range   ABO/RH(D)      A POS Performed at [facility], [place] [address], [place], [place] [address]   Vitamin B12     Status: None   Collection Time: 02/24/18 10:38 PM  Result Value Ref Range   Vitamin B-12 444 180 - 914 pg/mL    Comment: (NOTE) This assay is not validated for testing neonatal or myeloproliferative syndrome specimens for Vitamin B12 levels. Performed at [facility], [place] [address], [place], [place] [address]   Folate     Status: None   Collection Time: 02/24/18 10:38 PM  Result Value Ref Range   Folate 10.4 >5.9 ng/mL    Comment: Performed at [facility], [place] [address], [place], [place] [address]  Iron and TIBC     Status: Abnormal   Collection Time: 02/24/18 10:38 PM  Result Value Ref Range   Iron 20 (L) 28 - 170 ug/dL   TIBC 240 (L) 250 - 450 ug/dL   Saturation Ratios  8 (L) 10.4 - 31.8 %   UIBC 220 ug/dL    Comment: Performed at [facility], [place] [address] [place], [place] [address]  Ferritin     Status: None   Collection Time: 02/24/18 10:38 PM  Result Value Ref Range   Ferritin 95 11 - 307 ng/mL    Comment: Performed at [facility], [place] [address] [place], [place] [address]  Reticulocytes     Status: Abnormal   Collection Time: 02/24/18 10:38 PM  Result Value Ref Range   Retic Ct Pct 1.7 0.4 - 3.1 %   RBC. 4.17 3.87 - 5.11 MIL/uL   Retic Count, Absolute 69.6 19.0 - 186.0 K/uL   Immature Retic Fract 21.9 (H) 2.3 - 15.9 %    Comment: Performed at [facility], [place] [address], [place],  [address]  Procalcitonin - Baseline     Status: None   Collection Time: 02/24/18 10:38 PM  Result Value Ref Range   Procalcitonin <0.10 ng/mL    Comment:        Interpretation: PCT (Procalcitonin) <= 0.5 ng/mL: Systemic infection (sepsis) is not likely. Local bacterial infection is possible. (NOTE)       Sepsis PCT Algorithm           Lower Respiratory Tract                                      Infection PCT Algorithm    ----------------------------     ----------------------------         PCT < 0.25 ng/mL                PCT < 0.10 ng/mL         Strongly encourage             Strongly discourage   discontinuation of antibiotics    initiation of antibiotics    ----------------------------     -----------------------------       PCT 0.25 - 0.50 ng/mL             PCT 0.10 - 0.25 ng/mL               OR       >80% decrease in PCT            Discourage initiation of                                            antibiotics      Encourage discontinuation           of antibiotics    ----------------------------     -----------------------------         PCT >= 0.50 ng/mL              PCT 0.26 - 0.50 ng/mL               AND        <80% decrease in PCT             Encourage initiation of  antibiotics       Encourage continuation           of antibiotics    ----------------------------     -----------------------------        PCT >= 0.50 ng/mL                  PCT > 0.50 ng/mL               AND         increase in PCT                  Strongly encourage                                      initiation of antibiotics    Strongly encourage escalation           of antibiotics                                     -----------------------------                                           PCT <= 0.25 ng/mL                                                 OR                                        > 80% decrease in PCT                                     Discontinue / Do not initiate                                             antibiotics Performed at [place] [address], [place], [place] [address]   Glucose, capillary     Status: Abnormal   Collection Time: 02/25/18 12:12 AM  Result Value Ref Range   Glucose-Capillary 222 (H) 70 - 99 mg/dL   Comment 1 Notify RN   HIV antibody (Routine Testing)     Status: None   Collection Time: 02/25/18  5:48 AM  Result Value Ref Range   HIV Screen 4th Generation wRfx Non Reactive Non Reactive    Comment: (NOTE) Performed At: [facility] [place], [place] [id number] Rush Farmer MD WS:[id number]   Basic metabolic panel     Status: Abnormal   Collection Time: 02/25/18  5:48 AM  Result Value Ref Range   Sodium 141 135  - 145 mmol/L   Potassium 5.4 (H) 3.5 - 5.1 mmol/L    Comment: DELTA CHECK NOTED SLIGHT HEMOLYSIS    Chloride 97 (L) 98 - 111 mmol/L   CO2 37 (H) 22 -  32 mmol/L   Glucose, Bld 201 (H) 70 - 99 mg/dL   BUN 29 (H) 8 - 23 mg/dL   Creatinine, Ser 1.50 (H) 0.44 - 1.00 mg/dL   Calcium 8.6 (L) 8.9 - 10.3 mg/dL   GFR calc non Af Amer 36 (L) >60 mL/min   GFR calc Af Amer 41 (L) >60 mL/min    Comment: (NOTE) The eGFR has been calculated using the CKD EPI equation. This calculation has not been validated in all clinical situations. eGFR's persistently <60 mL/min signify possible Chronic Kidney Disease.    Anion gap 7 5 - 15    Comment: Performed at [facility], [place] [address], [place], [place] [address]  CBC WITH DIFFERENTIAL     Status: Abnormal   Collection Time: 02/25/18  5:48 AM  Result Value Ref Range   WBC 3.9 (L) 4.0 - 10.5 K/uL   RBC 3.77 (L) 3.87 - 5.11 MIL/uL   Hemoglobin 9.6 (L) 12.0 - 15.0 g/dL   HCT 34.4 (L) 36.0 - 46.0 %   MCV 91.2 80.0 - 100.0 fL   MCH 25.5 (L) 26.0 - 34.0 pg   MCHC 27.9 (L) 30.0 - 36.0 g/dL   RDW 16.9 (H) 11.5 - 15.5 %   Platelets 69 (L) 150 - 400 K/uL    Comment: REPEATED TO VERIFY Immature Platelet Fraction may be clinically indicated, consider ordering this additional test [id number] CONSISTENT WITH PREVIOUS RESULT    nRBC 0.0 0.0 - 0.2 %   Neutrophils Relative % 91 %   Neutro Abs 3.6 1.7 - 7.7 K/uL   Lymphocytes Relative 5 %   Lymphs Abs 0.2 (L) 0.7 - 4.0 K/uL   Monocytes Relative 2 %   Monocytes Absolute 0.1 0.1 - 1.0 K/uL   Eosinophils Relative 0 %   Eosinophils Absolute 0.0 0.0 - 0.5 K/uL   Basophils Relative 0 %   Basophils Absolute 0.0 0.0 - 0.1 K/uL   Immature Granulocytes 2 %   Abs Immature Granulocytes 0.08 (H) 0.00 - 0.07 K/uL    Comment: Performed at [facility], [place] [address] [place], [place] [address]  Glucose, capillary     Status: Abnormal   Collection Time: 02/25/18  7:42 AM  Result  Value Ref Range   Glucose-Capillary 173 (H) 70 - 99 mg/dL  Glucose, capillary     Status: Abnormal   Collection Time: 02/25/18 12:08 PM  Result Value Ref Range   Glucose-Capillary 275 (H) 70 - 99 mg/dL  Glucose, capillary     Status: Abnormal   Collection Time: 02/25/18  4:48 PM  Result Value Ref Range   Glucose-Capillary 220 (H) 70 - 99 mg/dL  Glucose, capillary     Status: Abnormal   Collection Time: 02/25/18  8:55 PM  Result Value Ref Range   Glucose-Capillary 235 (H) 70 - 99 mg/dL   Comment 1 Notify RN   CBC with Differential/Platelet     Status: Abnormal   Collection Time: 02/26/18  4:53 AM  Result Value Ref Range   WBC 7.6 4.0 - 10.5 K/uL   RBC 3.93 3.87 - 5.11 MIL/uL   Hemoglobin 9.9 (L) 12.0 - 15.0 g/dL   HCT 36.3 36.0 - 46.0 %   MCV 92.4 80.0 - 100.0 fL   MCH 25.2 (L) 26.0 - 34.0 pg   MCHC 27.3 (L) 30.0 - 36.0 g/dL   RDW 16.6 (H) 11.5 - 15.5 %   Platelets 82 (L) 150 - 400 K/uL  Comment: Immature Platelet Fraction may be clinically indicated, consider ordering this additional test [id number] CONSISTENT WITH PREVIOUS RESULT    nRBC 0.0 0.0 - 0.2 %   Neutrophils Relative % 92 %   Neutro Abs 7.0 1.7 - 7.7 K/uL   Lymphocytes Relative 4 %   Lymphs Abs 0.3 (L) 0.7 - 4.0 K/uL   Monocytes Relative 3 %   Monocytes Absolute 0.3 0.1 - 1.0 K/uL   Eosinophils Relative 0 %   Eosinophils Absolute 0.0 0.0 - 0.5 K/uL   Basophils Relative 0 %   Basophils Absolute 0.0 0.0 - 0.1 K/uL   Immature Granulocytes 1 %   Abs Immature Granulocytes 0.09 (H) 0.00 - 0.07 K/uL    Comment: Performed at [facility], [place] [address] [place], [place] [address]  Renal function panel     Status: Abnormal   Collection Time: 02/26/18  4:53 AM  Result Value Ref Range   Sodium 140 135 - 145 mmol/L   Potassium 5.6 (H) 3.5 - 5.1 mmol/L   Chloride 95 (L) 98 - 111 mmol/L   CO2 35 (H) 22 - 32 mmol/L   Glucose, Bld 191 (H) 70 - 99 mg/dL   BUN 31 (H) 8 - 23 mg/dL   Creatinine, Ser  1.42 (H) 0.44 - 1.00 mg/dL   Calcium 9.1 8.9 - 10.3 mg/dL   Phosphorus 3.5 2.5 - 4.6 mg/dL   Albumin 3.3 (L) 3.5 - 5.0 g/dL   GFR calc non Af Amer 38 (L) >60 mL/min   GFR calc Af Amer 44 (L) >60 mL/min    Comment: (NOTE) The eGFR has been calculated using the CKD EPI equation. This calculation has not been validated in all clinical situations. eGFR's persistently <60 mL/min signify possible Chronic Kidney Disease.    Anion gap 10 5 - 15    Comment: Performed at [facility], [place] [address] [place], [place] [address]  Glucose, capillary     Status: Abnormal   Collection Time: 02/26/18  7:34 AM  Result Value Ref Range   Glucose-Capillary 192 (H) 70 - 99 mg/dL  Glucose, capillary     Status: Abnormal   Collection Time: 02/26/18 11:50 AM  Result Value Ref Range   Glucose-Capillary 211 (H) 70 - 99 mg/dL  Glucose, capillary     Status: Abnormal   Collection Time: 02/26/18  4:49 PM  Result Value Ref Range   Glucose-Capillary 229 (H) 70 - 99 mg/dL  Glucose, capillary     Status: Abnormal   Collection Time: 02/26/18  9:00 PM  Result Value Ref Range   Glucose-Capillary 194 (H) 70 - 99 mg/dL  Basic metabolic panel     Status: Abnormal   Collection Time: 02/27/18  5:15 AM  Result Value Ref Range   Sodium 136 135 - 145 mmol/L   Potassium 5.0 3.5 - 5.1 mmol/L   Chloride 88 (L) 98 - 111 mmol/L   CO2 38 (H) 22 - 32 mmol/L   Glucose, Bld 125 (H) 70 - 99 mg/dL   BUN 45 (H) 8 - 23 mg/dL   Creatinine, Ser 1.57 (H) 0.44 - 1.00 mg/dL   Calcium 9.0 8.9 - 10.3 mg/dL   GFR calc non Af Amer 34 (L) >60 mL/min   GFR calc Af Amer 39 (L) >60 mL/min    Comment: (NOTE) The eGFR has been calculated using the CKD EPI equation. This calculation has not been validated in all clinical situations. eGFR's persistently <60 mL/min signify  possible Chronic Kidney Disease.    Anion gap 10 5 - 15    Comment: Performed at [facility], [place] [address] [place], [place]  [address]  Glucose, capillary     Status: Abnormal   Collection Time: 02/27/18  8:09 AM  Result Value Ref Range   Glucose-Capillary 109 (H) 70 - 99 mg/dL  Glucose, capillary     Status: Abnormal   Collection Time: 02/27/18 11:53 AM  Result Value Ref Range   Glucose-Capillary 173 (H) 70 - 99 mg/dL   Comment 1 Notify RN    Comment 2 Document in Chart   Glucose, capillary     Status: Abnormal   Collection Time: 02/27/18  4:51 PM  Result Value Ref Range   Glucose-Capillary 204 (H) 70 - 99 mg/dL   Comment 1 Notify RN    Comment 2 Document in Chart   Glucose, capillary     Status: Abnormal   Collection Time: 02/27/18  9:15 PM  Result Value Ref Range   Glucose-Capillary 169 (H) 70 - 99 mg/dL  Comprehensive metabolic panel     Status: Abnormal   Collection Time: 02/28/18  4:21 AM  Result Value Ref Range   Sodium 139 135 - 145 mmol/L   Potassium 4.3 3.5 - 5.1 mmol/L   Chloride 86 (L) 98 - 111 mmol/L   CO2 40 (H) 22 - 32 mmol/L   Glucose, Bld 154 (H) 70 - 99 mg/dL   BUN 55 (H) 8 - 23 mg/dL   Creatinine, Ser 1.93 (H) 0.44 - 1.00 mg/dL   Calcium 8.7 (L) 8.9 - 10.3 mg/dL   Total Protein 6.1 (L) 6.5 - 8.1 g/dL   Albumin 3.1 (L) 3.5 - 5.0 g/dL   AST 27 15 - 41 U/L   ALT 36 0 - 44 U/L   Alkaline Phosphatase 55 38 - 126 U/L   Total Bilirubin 0.5 0.3 - 1.2 mg/dL   GFR calc non Af Amer 26 (L) >60 mL/min   GFR calc Af Amer 30 (L) >60 mL/min    Comment: (NOTE) The eGFR has been calculated using the CKD EPI equation. This calculation has not been validated in all clinical situations. eGFR's persistently <60 mL/min signify possible Chronic Kidney Disease.    Anion gap 13 5 - 15    Comment: Performed at [facility], [place] [address], [place], [place] [address]  CBC with Differential/Platelet     Status: Abnormal   Collection Time: 02/28/18  4:21 AM  Result Value Ref Range   WBC 7.6 4.0 - 10.5 K/uL   RBC 3.80 (L) 3.87 - 5.11 MIL/uL   Hemoglobin 9.4 (L) 12.0 - 15.0 g/dL    HCT 34.1 (L) 36.0 - 46.0 %   MCV 89.7 80.0 - 100.0 fL   MCH 24.7 (L) 26.0 - 34.0 pg   MCHC 27.6 (L) 30.0 - 36.0 g/dL   RDW 16.7 (H) 11.5 - 15.5 %   Platelets 85 (L) 150 - 400 K/uL    Comment: Immature Platelet Fraction may be clinically indicated, consider ordering this additional test [id number]    nRBC 0.0 0.0 - 0.2 %   Neutrophils Relative % 81 %   Neutro Abs 6.1 1.7 - 7.7 K/uL   Lymphocytes Relative 7 %   Lymphs Abs 0.5 (L) 0.7 - 4.0 K/uL   Monocytes Relative 11 %   Monocytes Absolute 0.8 0.1 - 1.0 K/uL   Eosinophils Relative 0 %   Eosinophils Absolute 0.0  0.0 - 0.5 K/uL   Basophils Relative 0 %   Basophils Absolute 0.0 0.0 - 0.1 K/uL   Immature Granulocytes 1 %   Abs Immature Granulocytes 0.09 (H) 0.00 - 0.07 K/uL    Comment: Performed at [facility], [place] [address] [place], [place] [address]  Glucose, capillary     Status: Abnormal   Collection Time: 02/28/18  7:16 AM  Result Value Ref Range   Glucose-Capillary 128 (H) 70 - 99 mg/dL  Glucose, capillary     Status: Abnormal   Collection Time: 02/28/18 11:52 AM  Result Value Ref Range   Glucose-Capillary 146 (H) 70 - 99 mg/dL   Comment 1 Notify RN    Comment 2 Document in Chart   Glucose, capillary     Status: Abnormal   Collection Time: 02/28/18  4:33 PM  Result Value Ref Range   Glucose-Capillary 243 (H) 70 - 99 mg/dL   Comment 1 Notify RN    Comment 2 Document in Chart   Glucose, capillary     Status: Abnormal   Collection Time: 02/28/18  9:44 PM  Result Value Ref Range   Glucose-Capillary 182 (H) 70 - 99 mg/dL  Comprehensive metabolic panel     Status: Abnormal   Collection Time: 03/01/18  5:36 AM  Result Value Ref Range   Sodium 138 135 - 145 mmol/L   Potassium 4.4 3.5 - 5.1 mmol/L   Chloride 88 (L) 98 - 111 mmol/L   CO2 40 (H) 22 - 32 mmol/L   Glucose, Bld 150 (H) 70 - 99 mg/dL   BUN 68 (H) 8 - 23 mg/dL   Creatinine, Ser 2.02 (H) 0.44 - 1.00 mg/dL   Calcium 8.8 (L) 8.9 - 10.3 mg/dL     Total Protein 7.0 6.5 - 8.1 g/dL   Albumin 3.7 3.5 - 5.0 g/dL   AST 17 15 - 41 U/L   ALT 29 0 - 44 U/L   Alkaline Phosphatase 57 38 - 126 U/L   Total Bilirubin 0.7 0.3 - 1.2 mg/dL   GFR calc non Af Amer 25 (L) >60 mL/min   GFR calc Af Amer 29 (L) >60 mL/min    Comment: (NOTE) The eGFR has been calculated using the CKD EPI equation. This calculation has not been validated in all clinical situations. eGFR's persistently <60 mL/min signify possible Chronic Kidney Disease.    Anion gap 10 5 - 15    Comment: Performed at [facility], [place] [address], [place], [place] [address]  CBC with Differential/Platelet     Status: Abnormal   Collection Time: 03/01/18  5:36 AM  Result Value Ref Range   WBC 7.1 4.0 - 10.5 K/uL   RBC 4.41 3.87 - 5.11 MIL/uL   Hemoglobin 11.1 (L) 12.0 - 15.0 g/dL   HCT 38.9 36.0 - 46.0 %   MCV 88.2 80.0 - 100.0 fL   MCH 25.2 (L) 26.0 - 34.0 pg   MCHC 28.5 (L) 30.0 - 36.0 g/dL   RDW 16.7 (H) 11.5 - 15.5 %   Platelets 90 (L) 150 - 400 K/uL    Comment: Immature Platelet Fraction may be clinically indicated, consider ordering this additional test [id number]    nRBC 0.0 0.0 - 0.2 %   Neutrophils Relative % 85 %   Neutro Abs 6.0 1.7 - 7.7 K/uL   Lymphocytes Relative 6 %   Lymphs Abs 0.4 (L) 0.7 - 4.0 K/uL   Monocytes Relative 7 %  Monocytes Absolute 0.5 0.1 - 1.0 K/uL   Eosinophils Relative 0 %   Eosinophils Absolute 0.0 0.0 - 0.5 K/uL   Basophils Relative 0 %   Basophils Absolute 0.0 0.0 - 0.1 K/uL   Immature Granulocytes 2 %   Abs Immature Granulocytes 0.12 (H) 0.00 - 0.07 K/uL    Comment: Performed at [facility], [place] [address] [place], [place] [address]  Phosphorus     Status: None   Collection Time: 03/01/18  5:36 AM  Result Value Ref Range   Phosphorus 4.6 2.5 - 4.6 mg/dL    Comment: Performed at [facility], [place] [address] [place], [place] [address]  Glucose, capillary     Status:  Abnormal   Collection Time: 03/01/18  7:38 AM  Result Value Ref Range   Glucose-Capillary 137 (H) 70 - 99 mg/dL   Comment 1 Notify RN    Comment 2 Document in Chart   Glucose, capillary     Status: Abnormal   Collection Time: 03/01/18  1:10 PM  Result Value Ref Range   Glucose-Capillary 119 (H) 70 - 99 mg/dL   Comment 1 Notify RN    Comment 2 Document in Chart   Glucose, capillary     Status: Abnormal   Collection Time: 03/01/18  5:20 PM  Result Value Ref Range   Glucose-Capillary 192 (H) 70 - 99 mg/dL  Glucose, capillary     Status: Abnormal   Collection Time: 03/01/18  8:58 PM  Result Value Ref Range   Glucose-Capillary 152 (H) 70 - 99 mg/dL   Comment 1 Notify RN   Comprehensive metabolic panel     Status: Abnormal   Collection Time: 03/02/18  3:57 AM  Result Value Ref Range   Sodium 140 135 - 145 mmol/L   Potassium 4.1 3.5 - 5.1 mmol/L   Chloride 95 (L) 98 - 111 mmol/L   CO2 36 (H) 22 - 32 mmol/L   Glucose, Bld 140 (H) 70 - 99 mg/dL   BUN 67 (H) 8 - 23 mg/dL   Creatinine, Ser 1.98 (H) 0.44 - 1.00 mg/dL   Calcium 8.5 (L) 8.9 - 10.3 mg/dL   Total Protein 6.1 (L) 6.5 - 8.1 g/dL   Albumin 3.2 (L) 3.5 - 5.0 g/dL   AST 14 (L) 15 - 41 U/L   ALT 22 0 - 44 U/L   Alkaline Phosphatase 47 38 - 126 U/L   Total Bilirubin 0.5 0.3 - 1.2 mg/dL   GFR calc non Af Amer 26 (L) >60 mL/min   GFR calc Af Amer 30 (L) >60 mL/min    Comment: (NOTE) The eGFR has been calculated using the CKD EPI equation. This calculation has not been validated in all clinical situations. eGFR's persistently <60 mL/min signify possible Chronic Kidney Disease.    Anion gap 9 5 - 15    Comment: Performed at [facility], [place] [address] [place], [place] [address]  CBC with Differential/Platelet     Status: Abnormal   Collection Time: 03/02/18  3:57 AM  Result Value Ref Range   WBC 5.5 4.0 - 10.5 K/uL   RBC 3.83 (L) 3.87 - 5.11 MIL/uL   Hemoglobin 9.6 (L) 12.0 - 15.0 g/dL   HCT 33.8 (L)  36.0 - 46.0 %   MCV 88.3 80.0 - 100.0 fL   MCH 25.1 (L) 26.0 - 34.0 pg   MCHC 28.4 (L) 30.0 - 36.0 g/dL   RDW 16.8 (H) 11.5 -  15.5 %   Platelets 79 (L) 150 - 400 K/uL    Comment: Immature Platelet Fraction may be clinically indicated, consider ordering this additional test [id number]    nRBC 0.0 0.0 - 0.2 %   Neutrophils Relative % 78 %   Neutro Abs 4.4 1.7 - 7.7 K/uL   Lymphocytes Relative 11 %   Lymphs Abs 0.6 (L) 0.7 - 4.0 K/uL   Monocytes Relative 8 %   Monocytes Absolute 0.4 0.1 - 1.0 K/uL   Eosinophils Relative 0 %   Eosinophils Absolute 0.0 0.0 - 0.5 K/uL   Basophils Relative 0 %   Basophils Absolute 0.0 0.0 - 0.1 K/uL   Immature Granulocytes 3 %   Abs Immature Granulocytes 0.15 (H) 0.00 - 0.07 K/uL    Comment: Performed at [facility] [place] [address], [place], [place] [address]  Glucose, capillary     Status: Abnormal   Collection Time: 03/02/18  7:28 AM  Result Value Ref Range   Glucose-Capillary 100 (H) 70 - 99 mg/dL  Glucose, capillary     Status: Abnormal   Collection Time: 03/02/18 11:35 AM  Result Value Ref Range   Glucose-Capillary 163 (H) 70 - 99 mg/dL   Comment 1 Notify RN    Comment 2 Document in Chart   Glucose, capillary     Status: Abnormal   Collection Time: 03/02/18  4:40 PM  Result Value Ref Range   Glucose-Capillary 237 (H) 70 - 99 mg/dL   Comment 1 Notify RN    Comment 2 Document in Chart   CBC w/Diff     Status: Abnormal   Collection Time: 03/14/18 12:04 PM  Result Value Ref Range   WBC 5.6 4.0 - 10.5 K/uL   RBC 4.08 3.87 - 5.11 Mil/uL   Hemoglobin 10.6 (L) 12.0 - 15.0 g/dL   HCT 34.3 (L) 36.0 - 46.0 %   MCV 84.0 78.0 - 100.0 fl   MCHC 31.0 30.0 - 36.0 g/dL   RDW 19.2 (H) 11.5 - 15.5 %   Platelets 61.0 (L) 150.0 - 400.0 K/uL   Neutrophils Relative % 77.7 (H) 43.0 - 77.0 %   Lymphocytes Relative 12.6 12.0 - 46.0 %   Monocytes Relative 8.2 3.0 - 12.0 %   Eosinophils Relative 1.3 0.0 - 5.0 %   Basophils Relative 0.2 0.0  - 3.0 %   Neutro Abs 4.3 1.4 - 7.7 K/uL   Lymphs Abs 0.7 0.7 - 4.0 K/uL   Monocytes Absolute 0.5 0.1 - 1.0 K/uL   Eosinophils Absolute 0.1 0.0 - 0.7 K/uL   Basophils Absolute 0.0 0.0 - 0.1 K/uL  Comp Met (CMET)     Status: Abnormal   Collection Time: 03/14/18 12:04 PM  Result Value Ref Range   Sodium 140 135 - 145 mEq/L   Potassium 4.9 3.5 - 5.1 mEq/L   Chloride 99 96 - 112 mEq/L   CO2 39 (H) 19 - 32 mEq/L   Glucose, Bld 61 (L) 70 - 99 mg/dL   BUN 30 (H) 6 - 23 mg/dL   Creatinine, Ser 1.18 0.40 - 1.20 mg/dL   Total Bilirubin 0.4 0.2 - 1.2 mg/dL   Alkaline Phosphatase 54 39 - 117 U/L   AST 14 0 - 37 U/L   ALT 12 0 - 35 U/L   Total Protein 6.3 6.0 - 8.3 g/dL   Albumin 3.8 3.5 - 5.2 g/dL   Calcium 8.9 8.4 - 10.5 mg/dL   GFR 48.91 (L) >60.00  mL/min  Comprehensive metabolic panel     Status: Abnormal   Collection Time: 04/07/18  4:42 PM  Result Value Ref Range   Sodium 138 135 - 145 mmol/L   Potassium 4.4 3.5 - 5.1 mmol/L   Chloride 93 (L) 98 - 111 mmol/L   CO2 38 (H) 22 - 32 mmol/L   Glucose, Bld 156 (H) 70 - 99 mg/dL   BUN 16 8 - 23 mg/dL   Creatinine, Ser 1.38 (H) 0.44 - 1.00 mg/dL   Calcium 8.2 (L) 8.9 - 10.3 mg/dL   Total Protein 5.8 (L) 6.5 - 8.1 g/dL   Albumin 3.1 (L) 3.5 - 5.0 g/dL   AST 13 (L) 15 - 41 U/L   ALT 10 0 - 44 U/L   Alkaline Phosphatase 46 38 - 126 U/L   Total Bilirubin 0.8 0.3 - 1.2 mg/dL   GFR calc non Af Amer 39 (L) >60 mL/min   GFR calc Af Amer 46 (L) >60 mL/min    Comment: (NOTE) The eGFR has been calculated using the CKD EPI equation. This calculation has not been validated in all clinical situations. eGFR's persistently <60 mL/min signify possible Chronic Kidney Disease.    Anion gap 7 5 - 15    Comment: Performed at [facility], [address] [place], [place] [address]  Brain natriuretic peptide     Status: Abnormal   Collection Time: 04/07/18  4:42 PM  Result Value Ref Range   B Natriuretic Peptide 597.0 (H) 0.0 - 100.0 pg/mL     Comment: Performed at [facility], [address], [place], [place] [address]  Troponin I - Once     Status: Abnormal   Collection Time: 04/07/18  4:42 PM  Result Value Ref Range   Troponin I 0.03 (HH) <0.03 ng/mL    Comment: CRITICAL RESULT CALLED TO, READ BACK BY AND VERIFIED WITH: OAKLEY,B ON 04/07/18 AT 1740 BY LOY,C Performed at [facility], [address] [place], [place] [address]   CBC with Differential     Status: Abnormal   Collection Time: 04/07/18  4:42 PM  Result Value Ref Range   WBC 4.5 4.0 - 10.5 K/uL   RBC 4.02 3.87 - 5.11 MIL/uL   Hemoglobin 9.6 (L) 12.0 - 15.0 g/dL   HCT 35.9 (L) 36.0 - 46.0 %   MCV 89.3 80.0 - 100.0 fL   MCH 23.9 (L) 26.0 - 34.0 pg   MCHC 26.7 (L) 30.0 - 36.0 g/dL   RDW 17.4 (H) 11.5 - 15.5 %   Platelets 49 (L) 150 - 400 K/uL    Comment: PLATELET COUNT CONFIRMED BY SMEAR SPECIMEN CHECKED FOR CLOTS    nRBC 0.0 0.0 - 0.2 %   Neutrophils Relative % 83 %   Neutro Abs 3.7 1.7 - 7.7 K/uL   Lymphocytes Relative 8 %   Lymphs Abs 0.4 (L) 0.7 - 4.0 K/uL   Monocytes Relative 9 %   Monocytes Absolute 0.4 0.1 - 1.0 K/uL   Eosinophils Relative 0 %   Eosinophils Absolute 0.0 0.0 - 0.5 K/uL   Basophils Relative 0 %   Basophils Absolute 0.0 0.0 - 0.1 K/uL   Immature Granulocytes 0 %   Abs Immature Granulocytes 0.02 0.00 - 0.07 K/uL    Comment: Performed at [facility], [address], [place], [place] [address]  CBG monitoring, ED     Status: Abnormal   Collection Time: 04/07/18  4:55 PM  Result Value Ref Range   Glucose-Capillary  140 (H) 70 - 99 mg/dL  Urinalysis, Routine w reflex microscopic     Status: Abnormal   Collection Time: 04/07/18  6:20 PM  Result Value Ref Range   Color, Urine YELLOW YELLOW   APPearance HAZY (A) CLEAR   Specific Gravity, Urine 1.015 1.005 - 1.030   pH 6.0 5.0 - 8.0   Glucose, UA NEGATIVE NEGATIVE mg/dL   Hgb urine dipstick SMALL (A) NEGATIVE   Bilirubin Urine NEGATIVE NEGATIVE   Ketones, ur NEGATIVE NEGATIVE mg/dL     Protein, ur 30 (A) NEGATIVE mg/dL   Nitrite NEGATIVE NEGATIVE   Leukocytes, UA NEGATIVE NEGATIVE   RBC / HPF 6-10 0 - 5 RBC/hpf   WBC, UA 6-10 0 - 5 WBC/hpf   Bacteria, UA RARE (A) NONE SEEN   Squamous Epithelial / LPF 11-20 0 - 5    Comment: Performed at [facility], [address], [place], [place] [address]  Glucose, capillary     Status: Abnormal   Collection Time: 04/07/18  9:06 PM  Result Value Ref Range   Glucose-Capillary 128 (H) 70 - 99 mg/dL   Comment 1 Notify RN    Comment 2 Document in Chart   Basic metabolic panel     Status: Abnormal   Collection Time: 04/08/18  6:45 AM  Result Value Ref Range   Sodium 140 135 - 145 mmol/L   Potassium 4.3 3.5 - 5.1 mmol/L   Chloride 95 (L) 98 - 111 mmol/L   CO2 41 (H) 22 - 32 mmol/L   Glucose, Bld 114 (H) 70 - 99 mg/dL   BUN 16 8 - 23 mg/dL   Creatinine, Ser 1.35 (H) 0.44 - 1.00 mg/dL   Calcium 8.1 (L) 8.9 - 10.3 mg/dL   GFR calc non Af Amer 41 (L) >60 mL/min   GFR calc Af Amer 47 (L) >60 mL/min    Comment: (NOTE) The eGFR has been calculated using the CKD EPI equation. This calculation has not been validated in all clinical situations. eGFR's persistently <60 mL/min signify possible Chronic Kidney Disease.    Anion gap 4 (L) 5 - 15    Comment: Performed at [facility], [address], [place], [place] [address]  Glucose, capillary     Status: None   Collection Time: 04/08/18  8:01 AM  Result Value Ref Range   Glucose-Capillary 92 70 - 99 mg/dL   Comment 1 Notify RN    Comment 2 Document in Chart   ECHOCARDIOGRAM COMPLETE     Status: None   Collection Time: 04/08/18 11:37 AM  Result Value Ref Range   Weight 4,797.21 oz   Height 64 in   BP 110/55 mmHg  Glucose, capillary     Status: Abnormal   Collection Time: 04/08/18 11:55 AM  Result Value Ref Range   Glucose-Capillary 139 (H) 70 - 99 mg/dL   Comment 1 Notify RN    Comment 2 Document in Chart   Glucose, capillary     Status: Abnormal   Collection Time: 04/08/18   4:48 PM  Result Value Ref Range   Glucose-Capillary 203 (H) 70 - 99 mg/dL   Comment 1 Notify RN    Comment 2 Document in Chart   Glucose, capillary     Status: Abnormal   Collection Time: 04/08/18 10:14 PM  Result Value Ref Range   Glucose-Capillary 282 (H) 70 - 99 mg/dL   Comment 1 Notify RN    Comment 2 Document in Chart   Basic  metabolic panel     Status: Abnormal   Collection Time: 04/09/18  5:38 AM  Result Value Ref Range   Sodium 139 135 - 145 mmol/L   Potassium 4.2 3.5 - 5.1 mmol/L   Chloride 90 (L) 98 - 111 mmol/L   CO2 40 (H) 22 - 32 mmol/L   Glucose, Bld 260 (H) 70 - 99 mg/dL   BUN 23 8 - 23 mg/dL   Creatinine, Ser 1.58 (H) 0.44 - 1.00 mg/dL   Calcium 8.5 (L) 8.9 - 10.3 mg/dL   GFR calc non Af Amer 34 (L) >60 mL/min   GFR calc Af Amer 39 (L) >60 mL/min    Comment: (NOTE) The eGFR has been calculated using the CKD EPI equation. This calculation has not been validated in all clinical situations. eGFR's persistently <60 mL/min signify possible Chronic Kidney Disease.    Anion gap 9 5 - 15    Comment: Performed at [facility], [address], [place], [place] [address]  Magnesium     Status: None   Collection Time: 04/09/18  5:38 AM  Result Value Ref Range   Magnesium 1.7 1.7 - 2.4 mg/dL    Comment: Performed at [facility], [address], [place], [place] [address]  Vitamin B12     Status: None   Collection Time: 04/09/18  5:39 AM  Result Value Ref Range   Vitamin B-12 368 180 - 914 pg/mL    Comment: (NOTE) This assay is not validated for testing neonatal or myeloproliferative syndrome specimens for Vitamin B12 levels. Performed at [facility], [address], [place], [place] [address]   Folate     Status: Abnormal   Collection Time: 04/09/18  5:39 AM  Result Value Ref Range   Folate 5.9 (L) >5.9 ng/mL    Comment: Performed at [facility], [address], [place], [place] [address]  Hemoglobin A1c     Status: None   Collection Time: 04/09/18  5:39 AM    Result Value Ref Range   Hgb A1c MFr Bld 5.2 4.8 - 5.6 %    Comment: (NOTE) Pre diabetes:          5.7%-6.4% Diabetes:              >6.4% Glycemic control for   <7.0% adults with diabetes    Mean Plasma Glucose 102.54 mg/dL    Comment: Performed at [facility] [address], [place], [place] [address]  Glucose, capillary     Status: Abnormal   Collection Time: 04/09/18  7:59 AM  Result Value Ref Range   Glucose-Capillary 270 (H) 70 - 99 mg/dL   Comment 1 Notify RN    Comment 2 Document in Chart   Glucose, capillary     Status: Abnormal   Collection Time: 04/09/18 11:26 AM  Result Value Ref Range   Glucose-Capillary 225 (H) 70 - 99 mg/dL   Comment 1 Notify RN    Comment 2 Document in Chart   Glucose, capillary     Status: Abnormal   Collection Time: 04/09/18  4:41 PM  Result Value Ref Range   Glucose-Capillary 222 (H) 70 - 99 mg/dL   Comment 1 Notify RN    Comment 2 Document in Chart   Glucose, capillary     Status: Abnormal   Collection Time: 04/09/18 10:55 PM  Result Value Ref Range   Glucose-Capillary 224 (H) 70 - 99 mg/dL  Basic metabolic panel     Status: Abnormal  Collection Time: 04/10/18  5:46 AM  Result Value Ref Range   Sodium 138 135 - 145 mmol/L   Potassium 5.1 3.5 - 5.1 mmol/L    Comment: DELTA CHECK NOTED   Chloride 89 (L) 98 - 111 mmol/L   CO2 40 (H) 22 - 32 mmol/L   Glucose, Bld 210 (H) 70 - 99 mg/dL   BUN 29 (H) 8 - 23 mg/dL   Creatinine, Ser 1.37 (H) 0.44 - 1.00 mg/dL   Calcium 8.7 (L) 8.9 - 10.3 mg/dL   GFR calc non Af Amer 40 (L) >60 mL/min   GFR calc Af Amer 46 (L) >60 mL/min    Comment: (NOTE) The eGFR has been calculated using the CKD EPI equation. This calculation has not been validated in all clinical situations. eGFR's persistently <60 mL/min signify possible Chronic Kidney Disease.    Anion gap 9 5 - 15    Comment: Performed at [facility], [address], [place], [place] [address]  Glucose, capillary     Status: Abnormal    Collection Time: 04/10/18  8:11 AM  Result Value Ref Range   Glucose-Capillary 167 (H) 70 - 99 mg/dL  Glucose, capillary     Status: Abnormal   Collection Time: 04/10/18 11:38 AM  Result Value Ref Range   Glucose-Capillary 206 (H) 70 - 99 mg/dL   Comment 1 Notify RN   Glucose, capillary     Status: Abnormal   Collection Time: 04/10/18  4:58 PM  Result Value Ref Range   Glucose-Capillary 172 (H) 70 - 99 mg/dL   Comment 1 Notify RN    Comment 2 Document in Chart   Glucose, capillary     Status: Abnormal   Collection Time: 04/10/18 10:19 PM  Result Value Ref Range   Glucose-Capillary 182 (H) 70 - 99 mg/dL  Basic metabolic panel     Status: Abnormal   Collection Time: 04/11/18  5:09 AM  Result Value Ref Range   Sodium 138 135 - 145 mmol/L   Potassium 4.5 3.5 - 5.1 mmol/L   Chloride 88 (L) 98 - 111 mmol/L   CO2 40 (H) 22 - 32 mmol/L   Glucose, Bld 176 (H) 70 - 99 mg/dL   BUN 41 (H) 8 - 23 mg/dL   Creatinine, Ser 1.59 (H) 0.44 - 1.00 mg/dL   Calcium 8.6 (L) 8.9 - 10.3 mg/dL   GFR calc non Af Amer 33 (L) >60 mL/min   GFR calc Af Amer 39 (L) >60 mL/min    Comment: (NOTE) The eGFR has been calculated using the CKD EPI equation. This calculation has not been validated in all clinical situations. eGFR's persistently <60 mL/min signify possible Chronic Kidney Disease.    Anion gap 10 5 - 15    Comment: Performed at [facility], [address] [place], [place] [address]  Glucose, capillary     Status: Abnormal   Collection Time: 04/11/18  7:30 AM  Result Value Ref Range   Glucose-Capillary 155 (H) 70 - 99 mg/dL  Glucose, capillary     Status: Abnormal   Collection Time: 04/11/18 11:09 AM  Result Value Ref Range   Glucose-Capillary 222 (H) 70 - 99 mg/dL    Assessment/Plan: 1. Left upper extremity swelling New onset since hospitalization. No erythema or tenderness. 1+ pitting. Will check [place] to assess for DVT versus SVT as etiology. Elevate extremity. - US Venous Img Upper Uni  Left; Future  2. Acute on chronic diastolic CHF (congestive heart failure) (  [place]) Weight is down from discharge. Is watching intake and output and measuring daily weight. Will continue current regimen for now. Repeat labs today.  - CBC w/Diff - Comp Met (CMET)  3. CKD (chronic kidney disease), stage III ([place]) Repeat labs today to assess stability. Patient to call and schedule follow-up with Nephrology once this appointment has ended.  - CBC w/Diff - Comp Met (CMET)  4. Essential hypertension Stable. Labs today. Continue current regimen.   5. Chronic respiratory failure with hypoxia and hypercapnia (H[facility]) O2 stable today. Improving need for increased O2 with exertion. Referral to pulm rehab placed. Patient to schedule follow-up with Pulmonology.   6. Chronic pain syndrome Continue management per Pain Clinic.   [name], PA-C

## 2018-04-18 NOTE — Telephone Encounter (Signed)
FYI  Copied from [place] [phone]. Topic: General - Other >> Apr 18, 2018  3:29 PM Leward Quan A wrote: Reason for CRM: Darlina Guys with [facility] called to say that they will be going out to see patient on 04/19/18. Ph# [phone]

## 2018-04-18 NOTE — Telephone Encounter (Signed)
Noted. Thank you for the FYI.

## 2018-04-19 NOTE — Telephone Encounter (Signed)
Darlina Guys called back stating they will actually be going to patients home on 12/5.   Cb#[phone]

## 2018-04-20 DIAGNOSIS — G4733 Obstructive sleep apnea (adult) (pediatric): Secondary | ICD-10-CM | POA: Diagnosis not present

## 2018-04-20 DIAGNOSIS — M1991 Primary osteoarthritis, unspecified site: Secondary | ICD-10-CM | POA: Diagnosis not present

## 2018-04-20 DIAGNOSIS — N183 Chronic kidney disease, stage 3 (moderate): Secondary | ICD-10-CM | POA: Diagnosis not present

## 2018-04-20 DIAGNOSIS — E1122 Type 2 diabetes mellitus with diabetic chronic kidney disease: Secondary | ICD-10-CM | POA: Diagnosis not present

## 2018-04-20 DIAGNOSIS — E1140 Type 2 diabetes mellitus with diabetic neuropathy, unspecified: Secondary | ICD-10-CM | POA: Diagnosis not present

## 2018-04-20 DIAGNOSIS — Z794 Long term (current) use of insulin: Secondary | ICD-10-CM | POA: Diagnosis not present

## 2018-04-20 DIAGNOSIS — I2781 Cor pulmonale (chronic): Secondary | ICD-10-CM | POA: Diagnosis not present

## 2018-04-20 DIAGNOSIS — I130 Hypertensive heart and chronic kidney disease with heart failure and stage 1 through stage 4 chronic kidney disease, or unspecified chronic kidney disease: Secondary | ICD-10-CM | POA: Diagnosis not present

## 2018-04-20 DIAGNOSIS — J9621 Acute and chronic respiratory failure with hypoxia: Secondary | ICD-10-CM | POA: Diagnosis not present

## 2018-04-20 DIAGNOSIS — I471 Supraventricular tachycardia: Secondary | ICD-10-CM | POA: Diagnosis not present

## 2018-04-20 DIAGNOSIS — E1143 Type 2 diabetes mellitus with diabetic autonomic (poly)neuropathy: Secondary | ICD-10-CM | POA: Diagnosis not present

## 2018-04-20 DIAGNOSIS — E1151 Type 2 diabetes mellitus with diabetic peripheral angiopathy without gangrene: Secondary | ICD-10-CM | POA: Diagnosis not present

## 2018-04-20 DIAGNOSIS — K3184 Gastroparesis: Secondary | ICD-10-CM | POA: Diagnosis not present

## 2018-04-20 DIAGNOSIS — Z9981 Dependence on supplemental oxygen: Secondary | ICD-10-CM | POA: Diagnosis not present

## 2018-04-20 DIAGNOSIS — J9622 Acute and chronic respiratory failure with hypercapnia: Secondary | ICD-10-CM | POA: Diagnosis not present

## 2018-04-20 DIAGNOSIS — I872 Venous insufficiency (chronic) (peripheral): Secondary | ICD-10-CM | POA: Diagnosis not present

## 2018-04-20 DIAGNOSIS — I5033 Acute on chronic diastolic (congestive) heart failure: Secondary | ICD-10-CM | POA: Diagnosis not present

## 2018-04-20 DIAGNOSIS — M797 Fibromyalgia: Secondary | ICD-10-CM | POA: Diagnosis not present

## 2018-04-20 DIAGNOSIS — J439 Emphysema, unspecified: Secondary | ICD-10-CM | POA: Diagnosis not present

## 2018-04-20 DIAGNOSIS — Z7951 Long term (current) use of inhaled steroids: Secondary | ICD-10-CM | POA: Diagnosis not present

## 2018-04-20 DIAGNOSIS — G894 Chronic pain syndrome: Secondary | ICD-10-CM | POA: Diagnosis not present

## 2018-04-20 DIAGNOSIS — G2581 Restless legs syndrome: Secondary | ICD-10-CM | POA: Diagnosis not present

## 2018-04-21 ENCOUNTER — Telehealth: Payer: Self-pay | Admitting: Physician Assistant

## 2018-04-21 DIAGNOSIS — J9622 Acute and chronic respiratory failure with hypercapnia: Secondary | ICD-10-CM | POA: Diagnosis not present

## 2018-04-21 DIAGNOSIS — E1140 Type 2 diabetes mellitus with diabetic neuropathy, unspecified: Secondary | ICD-10-CM | POA: Diagnosis not present

## 2018-04-21 DIAGNOSIS — J9621 Acute and chronic respiratory failure with hypoxia: Secondary | ICD-10-CM | POA: Diagnosis not present

## 2018-04-21 DIAGNOSIS — I130 Hypertensive heart and chronic kidney disease with heart failure and stage 1 through stage 4 chronic kidney disease, or unspecified chronic kidney disease: Secondary | ICD-10-CM | POA: Diagnosis not present

## 2018-04-21 DIAGNOSIS — Z794 Long term (current) use of insulin: Secondary | ICD-10-CM | POA: Diagnosis not present

## 2018-04-21 DIAGNOSIS — M797 Fibromyalgia: Secondary | ICD-10-CM | POA: Diagnosis not present

## 2018-04-21 DIAGNOSIS — G2581 Restless legs syndrome: Secondary | ICD-10-CM | POA: Diagnosis not present

## 2018-04-21 DIAGNOSIS — I2781 Cor pulmonale (chronic): Secondary | ICD-10-CM | POA: Diagnosis not present

## 2018-04-21 DIAGNOSIS — G894 Chronic pain syndrome: Secondary | ICD-10-CM | POA: Diagnosis not present

## 2018-04-21 DIAGNOSIS — E1151 Type 2 diabetes mellitus with diabetic peripheral angiopathy without gangrene: Secondary | ICD-10-CM | POA: Diagnosis not present

## 2018-04-21 DIAGNOSIS — E1143 Type 2 diabetes mellitus with diabetic autonomic (poly)neuropathy: Secondary | ICD-10-CM | POA: Diagnosis not present

## 2018-04-21 DIAGNOSIS — I872 Venous insufficiency (chronic) (peripheral): Secondary | ICD-10-CM | POA: Diagnosis not present

## 2018-04-21 DIAGNOSIS — J439 Emphysema, unspecified: Secondary | ICD-10-CM | POA: Diagnosis not present

## 2018-04-21 DIAGNOSIS — M1991 Primary osteoarthritis, unspecified site: Secondary | ICD-10-CM | POA: Diagnosis not present

## 2018-04-21 DIAGNOSIS — Z7951 Long term (current) use of inhaled steroids: Secondary | ICD-10-CM | POA: Diagnosis not present

## 2018-04-21 DIAGNOSIS — Z9981 Dependence on supplemental oxygen: Secondary | ICD-10-CM | POA: Diagnosis not present

## 2018-04-21 DIAGNOSIS — E1122 Type 2 diabetes mellitus with diabetic chronic kidney disease: Secondary | ICD-10-CM | POA: Diagnosis not present

## 2018-04-21 DIAGNOSIS — G4733 Obstructive sleep apnea (adult) (pediatric): Secondary | ICD-10-CM | POA: Diagnosis not present

## 2018-04-21 DIAGNOSIS — I5033 Acute on chronic diastolic (congestive) heart failure: Secondary | ICD-10-CM | POA: Diagnosis not present

## 2018-04-21 DIAGNOSIS — N183 Chronic kidney disease, stage 3 (moderate): Secondary | ICD-10-CM | POA: Diagnosis not present

## 2018-04-21 DIAGNOSIS — I471 Supraventricular tachycardia: Secondary | ICD-10-CM | POA: Diagnosis not present

## 2018-04-21 DIAGNOSIS — K3184 Gastroparesis: Secondary | ICD-10-CM | POA: Diagnosis not present

## 2018-04-21 NOTE — Telephone Encounter (Signed)
Ok to give verbal orders?

## 2018-04-21 NOTE — Telephone Encounter (Signed)
Copied from [place] [phone]. Topic: Quick Communication - Home Health Verbal Orders >> Apr 21, 2018  2:09 PM Berneta Levins wrote: Caller/Agency: Levada Dy with [facility] Number: [phone], [place] to leave a message Requesting OT/PT/Skilled Nursing/Social Work: Nursing and Home Health aid Frequency for nursing:  1 week 1, 3 week 2, 2 week 5, 1 week 58for medical and medication management for heart failure Frequency for home health aid:  2x a week for 4 weeks for bathing and ADLs

## 2018-04-21 NOTE — Telephone Encounter (Signed)
Left detailed message on VM for Angela verbal orders listed below

## 2018-04-24 ENCOUNTER — Telehealth: Payer: Self-pay | Admitting: Physician Assistant

## 2018-04-24 ENCOUNTER — Other Ambulatory Visit: Payer: Self-pay | Admitting: Gastroenterology

## 2018-04-24 NOTE — Telephone Encounter (Signed)
Ok for verbal orders ?

## 2018-04-24 NOTE — Telephone Encounter (Signed)
Copied from [place] [phone]. Topic: Quick Communication - Home Health Verbal Orders >> Apr 24, 2018  8:37 AM Nils Flack wrote: Caller/Agency:[facility]  Callback Number: [phone] Requesting OT/PT/Skilled Nursing/Social Work: PT  Frequency: 2 week 8

## 2018-04-24 NOTE — Telephone Encounter (Signed)
Verbal orders given to Vincente Liberty at [place] for OT/PT/ Skilled nursing. PT is twice a week for 8 weeks.

## 2018-04-25 DIAGNOSIS — E1140 Type 2 diabetes mellitus with diabetic neuropathy, unspecified: Secondary | ICD-10-CM | POA: Diagnosis not present

## 2018-04-25 DIAGNOSIS — J9621 Acute and chronic respiratory failure with hypoxia: Secondary | ICD-10-CM | POA: Diagnosis not present

## 2018-04-25 DIAGNOSIS — N183 Chronic kidney disease, stage 3 (moderate): Secondary | ICD-10-CM | POA: Diagnosis not present

## 2018-04-25 DIAGNOSIS — I130 Hypertensive heart and chronic kidney disease with heart failure and stage 1 through stage 4 chronic kidney disease, or unspecified chronic kidney disease: Secondary | ICD-10-CM | POA: Diagnosis not present

## 2018-04-25 DIAGNOSIS — E1143 Type 2 diabetes mellitus with diabetic autonomic (poly)neuropathy: Secondary | ICD-10-CM | POA: Diagnosis not present

## 2018-04-25 DIAGNOSIS — I471 Supraventricular tachycardia: Secondary | ICD-10-CM | POA: Diagnosis not present

## 2018-04-25 DIAGNOSIS — Z7951 Long term (current) use of inhaled steroids: Secondary | ICD-10-CM | POA: Diagnosis not present

## 2018-04-25 DIAGNOSIS — E1151 Type 2 diabetes mellitus with diabetic peripheral angiopathy without gangrene: Secondary | ICD-10-CM | POA: Diagnosis not present

## 2018-04-25 DIAGNOSIS — G4733 Obstructive sleep apnea (adult) (pediatric): Secondary | ICD-10-CM | POA: Diagnosis not present

## 2018-04-25 DIAGNOSIS — Z794 Long term (current) use of insulin: Secondary | ICD-10-CM | POA: Diagnosis not present

## 2018-04-25 DIAGNOSIS — K3184 Gastroparesis: Secondary | ICD-10-CM | POA: Diagnosis not present

## 2018-04-25 DIAGNOSIS — G894 Chronic pain syndrome: Secondary | ICD-10-CM | POA: Diagnosis not present

## 2018-04-25 DIAGNOSIS — G2581 Restless legs syndrome: Secondary | ICD-10-CM | POA: Diagnosis not present

## 2018-04-25 DIAGNOSIS — J439 Emphysema, unspecified: Secondary | ICD-10-CM | POA: Diagnosis not present

## 2018-04-25 DIAGNOSIS — Z9981 Dependence on supplemental oxygen: Secondary | ICD-10-CM | POA: Diagnosis not present

## 2018-04-25 DIAGNOSIS — M797 Fibromyalgia: Secondary | ICD-10-CM | POA: Diagnosis not present

## 2018-04-25 DIAGNOSIS — E1122 Type 2 diabetes mellitus with diabetic chronic kidney disease: Secondary | ICD-10-CM | POA: Diagnosis not present

## 2018-04-25 DIAGNOSIS — I872 Venous insufficiency (chronic) (peripheral): Secondary | ICD-10-CM | POA: Diagnosis not present

## 2018-04-25 DIAGNOSIS — I5033 Acute on chronic diastolic (congestive) heart failure: Secondary | ICD-10-CM | POA: Diagnosis not present

## 2018-04-25 DIAGNOSIS — M1991 Primary osteoarthritis, unspecified site: Secondary | ICD-10-CM | POA: Diagnosis not present

## 2018-04-25 DIAGNOSIS — I2781 Cor pulmonale (chronic): Secondary | ICD-10-CM | POA: Diagnosis not present

## 2018-04-25 DIAGNOSIS — J9622 Acute and chronic respiratory failure with hypercapnia: Secondary | ICD-10-CM | POA: Diagnosis not present

## 2018-04-26 DIAGNOSIS — E1140 Type 2 diabetes mellitus with diabetic neuropathy, unspecified: Secondary | ICD-10-CM | POA: Diagnosis not present

## 2018-04-26 DIAGNOSIS — G4733 Obstructive sleep apnea (adult) (pediatric): Secondary | ICD-10-CM | POA: Diagnosis not present

## 2018-04-26 DIAGNOSIS — M1991 Primary osteoarthritis, unspecified site: Secondary | ICD-10-CM | POA: Diagnosis not present

## 2018-04-26 DIAGNOSIS — Z7951 Long term (current) use of inhaled steroids: Secondary | ICD-10-CM | POA: Diagnosis not present

## 2018-04-26 DIAGNOSIS — I872 Venous insufficiency (chronic) (peripheral): Secondary | ICD-10-CM | POA: Diagnosis not present

## 2018-04-26 DIAGNOSIS — G2581 Restless legs syndrome: Secondary | ICD-10-CM | POA: Diagnosis not present

## 2018-04-26 DIAGNOSIS — E1122 Type 2 diabetes mellitus with diabetic chronic kidney disease: Secondary | ICD-10-CM | POA: Diagnosis not present

## 2018-04-26 DIAGNOSIS — I471 Supraventricular tachycardia: Secondary | ICD-10-CM | POA: Diagnosis not present

## 2018-04-26 DIAGNOSIS — J439 Emphysema, unspecified: Secondary | ICD-10-CM | POA: Diagnosis not present

## 2018-04-26 DIAGNOSIS — J9621 Acute and chronic respiratory failure with hypoxia: Secondary | ICD-10-CM | POA: Diagnosis not present

## 2018-04-26 DIAGNOSIS — I2781 Cor pulmonale (chronic): Secondary | ICD-10-CM | POA: Diagnosis not present

## 2018-04-26 DIAGNOSIS — K3184 Gastroparesis: Secondary | ICD-10-CM | POA: Diagnosis not present

## 2018-04-26 DIAGNOSIS — N183 Chronic kidney disease, stage 3 (moderate): Secondary | ICD-10-CM | POA: Diagnosis not present

## 2018-04-26 DIAGNOSIS — E1143 Type 2 diabetes mellitus with diabetic autonomic (poly)neuropathy: Secondary | ICD-10-CM | POA: Diagnosis not present

## 2018-04-26 DIAGNOSIS — Z794 Long term (current) use of insulin: Secondary | ICD-10-CM | POA: Diagnosis not present

## 2018-04-26 DIAGNOSIS — Z9981 Dependence on supplemental oxygen: Secondary | ICD-10-CM | POA: Diagnosis not present

## 2018-04-26 DIAGNOSIS — I5033 Acute on chronic diastolic (congestive) heart failure: Secondary | ICD-10-CM | POA: Diagnosis not present

## 2018-04-26 DIAGNOSIS — E1151 Type 2 diabetes mellitus with diabetic peripheral angiopathy without gangrene: Secondary | ICD-10-CM | POA: Diagnosis not present

## 2018-04-26 DIAGNOSIS — G894 Chronic pain syndrome: Secondary | ICD-10-CM | POA: Diagnosis not present

## 2018-04-26 DIAGNOSIS — I130 Hypertensive heart and chronic kidney disease with heart failure and stage 1 through stage 4 chronic kidney disease, or unspecified chronic kidney disease: Secondary | ICD-10-CM | POA: Diagnosis not present

## 2018-04-26 DIAGNOSIS — M797 Fibromyalgia: Secondary | ICD-10-CM | POA: Diagnosis not present

## 2018-04-26 DIAGNOSIS — J9622 Acute and chronic respiratory failure with hypercapnia: Secondary | ICD-10-CM | POA: Diagnosis not present

## 2018-04-27 DIAGNOSIS — G2581 Restless legs syndrome: Secondary | ICD-10-CM | POA: Diagnosis not present

## 2018-04-27 DIAGNOSIS — G894 Chronic pain syndrome: Secondary | ICD-10-CM | POA: Diagnosis not present

## 2018-04-27 DIAGNOSIS — J439 Emphysema, unspecified: Secondary | ICD-10-CM | POA: Diagnosis not present

## 2018-04-27 DIAGNOSIS — Z7951 Long term (current) use of inhaled steroids: Secondary | ICD-10-CM | POA: Diagnosis not present

## 2018-04-27 DIAGNOSIS — I872 Venous insufficiency (chronic) (peripheral): Secondary | ICD-10-CM | POA: Diagnosis not present

## 2018-04-27 DIAGNOSIS — G4733 Obstructive sleep apnea (adult) (pediatric): Secondary | ICD-10-CM | POA: Diagnosis not present

## 2018-04-27 DIAGNOSIS — J9621 Acute and chronic respiratory failure with hypoxia: Secondary | ICD-10-CM | POA: Diagnosis not present

## 2018-04-27 DIAGNOSIS — M1991 Primary osteoarthritis, unspecified site: Secondary | ICD-10-CM | POA: Diagnosis not present

## 2018-04-27 DIAGNOSIS — I130 Hypertensive heart and chronic kidney disease with heart failure and stage 1 through stage 4 chronic kidney disease, or unspecified chronic kidney disease: Secondary | ICD-10-CM | POA: Diagnosis not present

## 2018-04-27 DIAGNOSIS — J9622 Acute and chronic respiratory failure with hypercapnia: Secondary | ICD-10-CM | POA: Diagnosis not present

## 2018-04-27 DIAGNOSIS — K3184 Gastroparesis: Secondary | ICD-10-CM | POA: Diagnosis not present

## 2018-04-27 DIAGNOSIS — M797 Fibromyalgia: Secondary | ICD-10-CM | POA: Diagnosis not present

## 2018-04-27 DIAGNOSIS — M542 Cervicalgia: Secondary | ICD-10-CM | POA: Diagnosis not present

## 2018-04-27 DIAGNOSIS — Z794 Long term (current) use of insulin: Secondary | ICD-10-CM | POA: Diagnosis not present

## 2018-04-27 DIAGNOSIS — N183 Chronic kidney disease, stage 3 (moderate): Secondary | ICD-10-CM | POA: Diagnosis not present

## 2018-04-27 DIAGNOSIS — M47817 Spondylosis without myelopathy or radiculopathy, lumbosacral region: Secondary | ICD-10-CM | POA: Diagnosis not present

## 2018-04-27 DIAGNOSIS — M2391 Unspecified internal derangement of right knee: Secondary | ICD-10-CM | POA: Diagnosis not present

## 2018-04-27 DIAGNOSIS — E1151 Type 2 diabetes mellitus with diabetic peripheral angiopathy without gangrene: Secondary | ICD-10-CM | POA: Diagnosis not present

## 2018-04-27 DIAGNOSIS — E1140 Type 2 diabetes mellitus with diabetic neuropathy, unspecified: Secondary | ICD-10-CM | POA: Diagnosis not present

## 2018-04-27 DIAGNOSIS — I471 Supraventricular tachycardia: Secondary | ICD-10-CM | POA: Diagnosis not present

## 2018-04-27 DIAGNOSIS — E1122 Type 2 diabetes mellitus with diabetic chronic kidney disease: Secondary | ICD-10-CM | POA: Diagnosis not present

## 2018-04-27 DIAGNOSIS — I5033 Acute on chronic diastolic (congestive) heart failure: Secondary | ICD-10-CM | POA: Diagnosis not present

## 2018-04-27 DIAGNOSIS — E1143 Type 2 diabetes mellitus with diabetic autonomic (poly)neuropathy: Secondary | ICD-10-CM | POA: Diagnosis not present

## 2018-04-27 DIAGNOSIS — Z9981 Dependence on supplemental oxygen: Secondary | ICD-10-CM | POA: Diagnosis not present

## 2018-04-27 DIAGNOSIS — I2781 Cor pulmonale (chronic): Secondary | ICD-10-CM | POA: Diagnosis not present

## 2018-04-28 ENCOUNTER — Inpatient Hospital Stay: Payer: Medicare Other | Admitting: Internal Medicine

## 2018-04-28 DIAGNOSIS — J439 Emphysema, unspecified: Secondary | ICD-10-CM | POA: Diagnosis not present

## 2018-04-28 DIAGNOSIS — G894 Chronic pain syndrome: Secondary | ICD-10-CM | POA: Diagnosis not present

## 2018-04-28 DIAGNOSIS — I471 Supraventricular tachycardia: Secondary | ICD-10-CM | POA: Diagnosis not present

## 2018-04-28 DIAGNOSIS — I872 Venous insufficiency (chronic) (peripheral): Secondary | ICD-10-CM | POA: Diagnosis not present

## 2018-04-28 DIAGNOSIS — E1151 Type 2 diabetes mellitus with diabetic peripheral angiopathy without gangrene: Secondary | ICD-10-CM | POA: Diagnosis not present

## 2018-04-28 DIAGNOSIS — I5033 Acute on chronic diastolic (congestive) heart failure: Secondary | ICD-10-CM | POA: Diagnosis not present

## 2018-04-28 DIAGNOSIS — I2781 Cor pulmonale (chronic): Secondary | ICD-10-CM | POA: Diagnosis not present

## 2018-04-28 DIAGNOSIS — I130 Hypertensive heart and chronic kidney disease with heart failure and stage 1 through stage 4 chronic kidney disease, or unspecified chronic kidney disease: Secondary | ICD-10-CM | POA: Diagnosis not present

## 2018-04-28 DIAGNOSIS — Z794 Long term (current) use of insulin: Secondary | ICD-10-CM | POA: Diagnosis not present

## 2018-04-28 DIAGNOSIS — G2581 Restless legs syndrome: Secondary | ICD-10-CM | POA: Diagnosis not present

## 2018-04-28 DIAGNOSIS — M797 Fibromyalgia: Secondary | ICD-10-CM | POA: Diagnosis not present

## 2018-04-28 DIAGNOSIS — N183 Chronic kidney disease, stage 3 (moderate): Secondary | ICD-10-CM | POA: Diagnosis not present

## 2018-04-28 DIAGNOSIS — Z9981 Dependence on supplemental oxygen: Secondary | ICD-10-CM | POA: Diagnosis not present

## 2018-04-28 DIAGNOSIS — M1991 Primary osteoarthritis, unspecified site: Secondary | ICD-10-CM | POA: Diagnosis not present

## 2018-04-28 DIAGNOSIS — E1122 Type 2 diabetes mellitus with diabetic chronic kidney disease: Secondary | ICD-10-CM | POA: Diagnosis not present

## 2018-04-28 DIAGNOSIS — J9622 Acute and chronic respiratory failure with hypercapnia: Secondary | ICD-10-CM | POA: Diagnosis not present

## 2018-04-28 DIAGNOSIS — G4733 Obstructive sleep apnea (adult) (pediatric): Secondary | ICD-10-CM | POA: Diagnosis not present

## 2018-04-28 DIAGNOSIS — Z7951 Long term (current) use of inhaled steroids: Secondary | ICD-10-CM | POA: Diagnosis not present

## 2018-04-28 DIAGNOSIS — E1140 Type 2 diabetes mellitus with diabetic neuropathy, unspecified: Secondary | ICD-10-CM | POA: Diagnosis not present

## 2018-04-28 DIAGNOSIS — E1143 Type 2 diabetes mellitus with diabetic autonomic (poly)neuropathy: Secondary | ICD-10-CM | POA: Diagnosis not present

## 2018-04-28 DIAGNOSIS — K3184 Gastroparesis: Secondary | ICD-10-CM | POA: Diagnosis not present

## 2018-04-28 DIAGNOSIS — J9621 Acute and chronic respiratory failure with hypoxia: Secondary | ICD-10-CM | POA: Diagnosis not present

## 2018-05-01 DIAGNOSIS — M797 Fibromyalgia: Secondary | ICD-10-CM | POA: Diagnosis not present

## 2018-05-01 DIAGNOSIS — J439 Emphysema, unspecified: Secondary | ICD-10-CM | POA: Diagnosis not present

## 2018-05-01 DIAGNOSIS — I5033 Acute on chronic diastolic (congestive) heart failure: Secondary | ICD-10-CM | POA: Diagnosis not present

## 2018-05-01 DIAGNOSIS — E1151 Type 2 diabetes mellitus with diabetic peripheral angiopathy without gangrene: Secondary | ICD-10-CM | POA: Diagnosis not present

## 2018-05-01 DIAGNOSIS — E1140 Type 2 diabetes mellitus with diabetic neuropathy, unspecified: Secondary | ICD-10-CM | POA: Diagnosis not present

## 2018-05-01 DIAGNOSIS — I130 Hypertensive heart and chronic kidney disease with heart failure and stage 1 through stage 4 chronic kidney disease, or unspecified chronic kidney disease: Secondary | ICD-10-CM | POA: Diagnosis not present

## 2018-05-01 DIAGNOSIS — E1122 Type 2 diabetes mellitus with diabetic chronic kidney disease: Secondary | ICD-10-CM | POA: Diagnosis not present

## 2018-05-01 DIAGNOSIS — G2581 Restless legs syndrome: Secondary | ICD-10-CM | POA: Diagnosis not present

## 2018-05-01 DIAGNOSIS — K3184 Gastroparesis: Secondary | ICD-10-CM | POA: Diagnosis not present

## 2018-05-01 DIAGNOSIS — I872 Venous insufficiency (chronic) (peripheral): Secondary | ICD-10-CM | POA: Diagnosis not present

## 2018-05-01 DIAGNOSIS — J9621 Acute and chronic respiratory failure with hypoxia: Secondary | ICD-10-CM | POA: Diagnosis not present

## 2018-05-01 DIAGNOSIS — Z794 Long term (current) use of insulin: Secondary | ICD-10-CM | POA: Diagnosis not present

## 2018-05-01 DIAGNOSIS — Z9981 Dependence on supplemental oxygen: Secondary | ICD-10-CM | POA: Diagnosis not present

## 2018-05-01 DIAGNOSIS — E1143 Type 2 diabetes mellitus with diabetic autonomic (poly)neuropathy: Secondary | ICD-10-CM | POA: Diagnosis not present

## 2018-05-01 DIAGNOSIS — I2781 Cor pulmonale (chronic): Secondary | ICD-10-CM | POA: Diagnosis not present

## 2018-05-01 DIAGNOSIS — G894 Chronic pain syndrome: Secondary | ICD-10-CM | POA: Diagnosis not present

## 2018-05-01 DIAGNOSIS — N183 Chronic kidney disease, stage 3 (moderate): Secondary | ICD-10-CM | POA: Diagnosis not present

## 2018-05-01 DIAGNOSIS — Z7951 Long term (current) use of inhaled steroids: Secondary | ICD-10-CM | POA: Diagnosis not present

## 2018-05-01 DIAGNOSIS — M1991 Primary osteoarthritis, unspecified site: Secondary | ICD-10-CM | POA: Diagnosis not present

## 2018-05-01 DIAGNOSIS — G4733 Obstructive sleep apnea (adult) (pediatric): Secondary | ICD-10-CM | POA: Diagnosis not present

## 2018-05-01 DIAGNOSIS — I471 Supraventricular tachycardia: Secondary | ICD-10-CM | POA: Diagnosis not present

## 2018-05-01 DIAGNOSIS — J9622 Acute and chronic respiratory failure with hypercapnia: Secondary | ICD-10-CM | POA: Diagnosis not present

## 2018-05-02 ENCOUNTER — Ambulatory Visit (INDEPENDENT_AMBULATORY_CARE_PROVIDER_SITE_OTHER): Payer: Medicare Other | Admitting: Physician Assistant

## 2018-05-02 ENCOUNTER — Other Ambulatory Visit: Payer: Self-pay

## 2018-05-02 ENCOUNTER — Telehealth: Payer: Self-pay | Admitting: Physician Assistant

## 2018-05-02 ENCOUNTER — Encounter: Payer: Self-pay | Admitting: Physician Assistant

## 2018-05-02 VITALS — BP 130/58 | HR 92 | Temp 98.5°F | Resp 20 | Ht 64.0 in | Wt 251.4 lb

## 2018-05-02 DIAGNOSIS — J449 Chronic obstructive pulmonary disease, unspecified: Secondary | ICD-10-CM | POA: Diagnosis not present

## 2018-05-02 DIAGNOSIS — W101XXA Fall (on)(from) sidewalk curb, initial encounter: Secondary | ICD-10-CM | POA: Diagnosis not present

## 2018-05-02 DIAGNOSIS — E1151 Type 2 diabetes mellitus with diabetic peripheral angiopathy without gangrene: Secondary | ICD-10-CM | POA: Diagnosis not present

## 2018-05-02 DIAGNOSIS — E119 Type 2 diabetes mellitus without complications: Secondary | ICD-10-CM

## 2018-05-02 DIAGNOSIS — E1140 Type 2 diabetes mellitus with diabetic neuropathy, unspecified: Secondary | ICD-10-CM | POA: Diagnosis not present

## 2018-05-02 DIAGNOSIS — M797 Fibromyalgia: Secondary | ICD-10-CM | POA: Diagnosis not present

## 2018-05-02 DIAGNOSIS — E1143 Type 2 diabetes mellitus with diabetic autonomic (poly)neuropathy: Secondary | ICD-10-CM | POA: Diagnosis not present

## 2018-05-02 DIAGNOSIS — G4733 Obstructive sleep apnea (adult) (pediatric): Secondary | ICD-10-CM | POA: Diagnosis not present

## 2018-05-02 DIAGNOSIS — M1991 Primary osteoarthritis, unspecified site: Secondary | ICD-10-CM | POA: Diagnosis not present

## 2018-05-02 DIAGNOSIS — Z794 Long term (current) use of insulin: Secondary | ICD-10-CM | POA: Diagnosis not present

## 2018-05-02 DIAGNOSIS — Z23 Encounter for immunization: Secondary | ICD-10-CM

## 2018-05-02 DIAGNOSIS — I2781 Cor pulmonale (chronic): Secondary | ICD-10-CM | POA: Diagnosis not present

## 2018-05-02 DIAGNOSIS — J9622 Acute and chronic respiratory failure with hypercapnia: Secondary | ICD-10-CM | POA: Diagnosis not present

## 2018-05-02 DIAGNOSIS — Z7951 Long term (current) use of inhaled steroids: Secondary | ICD-10-CM | POA: Diagnosis not present

## 2018-05-02 DIAGNOSIS — I130 Hypertensive heart and chronic kidney disease with heart failure and stage 1 through stage 4 chronic kidney disease, or unspecified chronic kidney disease: Secondary | ICD-10-CM | POA: Diagnosis not present

## 2018-05-02 DIAGNOSIS — K3184 Gastroparesis: Secondary | ICD-10-CM | POA: Diagnosis not present

## 2018-05-02 DIAGNOSIS — G2581 Restless legs syndrome: Secondary | ICD-10-CM | POA: Diagnosis not present

## 2018-05-02 DIAGNOSIS — G894 Chronic pain syndrome: Secondary | ICD-10-CM | POA: Diagnosis not present

## 2018-05-02 DIAGNOSIS — J439 Emphysema, unspecified: Secondary | ICD-10-CM | POA: Diagnosis not present

## 2018-05-02 DIAGNOSIS — T148XXA Other injury of unspecified body region, initial encounter: Secondary | ICD-10-CM | POA: Diagnosis not present

## 2018-05-02 DIAGNOSIS — I471 Supraventricular tachycardia: Secondary | ICD-10-CM | POA: Diagnosis not present

## 2018-05-02 DIAGNOSIS — E1122 Type 2 diabetes mellitus with diabetic chronic kidney disease: Secondary | ICD-10-CM | POA: Diagnosis not present

## 2018-05-02 DIAGNOSIS — N183 Chronic kidney disease, stage 3 (moderate): Secondary | ICD-10-CM | POA: Diagnosis not present

## 2018-05-02 DIAGNOSIS — Z9981 Dependence on supplemental oxygen: Secondary | ICD-10-CM | POA: Diagnosis not present

## 2018-05-02 DIAGNOSIS — I5033 Acute on chronic diastolic (congestive) heart failure: Secondary | ICD-10-CM | POA: Diagnosis not present

## 2018-05-02 DIAGNOSIS — J9621 Acute and chronic respiratory failure with hypoxia: Secondary | ICD-10-CM | POA: Diagnosis not present

## 2018-05-02 DIAGNOSIS — I872 Venous insufficiency (chronic) (peripheral): Secondary | ICD-10-CM | POA: Diagnosis not present

## 2018-05-02 MED ORDER — GLUCOSE BLOOD VI STRP: ORAL_STRIP | 12 refills | Status: DC

## 2018-05-02 MED ORDER — CLOTRIMAZOLE-BETAMETHASONE 1-0.05 % EX CREA: 1.0000 "application " | TOPICAL_CREAM | Freq: Two times a day (BID) | CUTANEOUS | 0 refills | Status: AC | PRN

## 2018-05-02 MED ORDER — VARENICLINE TARTRATE 0.5 MG X 11 & 1 MG X 42 PO MISC: ORAL | 0 refills | Status: DC

## 2018-05-02 NOTE — Progress Notes (Signed)
Patient presents to clinic today c/o L arm and hip pain s/p fall occurring 5 days ago. Notes she was [facility] a [facility] trying to walk in to the building when two children can running around the [place] and bumping into her as she was stepping up on the curve. Lost balance and fell down on her left side. Denies any head trauma or LOC. Notes hitting her L shoulder, arm and hip. Was immediately helped up by 1 family member and some bystanders. Since that time has noted soreness of shoulder, arm and hip. Notes bruising of forearm and elbow but none elsewhere. Denies decreased ROM but notes pain with flexion or forearm and external rotation of shoulder. Wanted assessment to make sure imaging was not needed although she states she does not feel anything is broken. Soreness is improving daily at this point.  Past Medical History:  Diagnosis Date  . Abdominal mass of other site   . Cervical compression fracture ([place])   . CHF (congestive heart failure) ([place])   . Chronic kidney disease, stage 3 (HCC)    Borderline Stage 2-3  . Chronic lower limb pain   . COPD (chronic obstructive pulmonary disease) ([place])   . CTS (carpal tunnel syndrome)   . Depression   . Diabetes ([place])    Type II  . Fibromyalgia   . Hepatitis C    cured last year (2018)  . Hypercholesteremia   . Hypertension   . Hypothyroidism   . Morbid obesity ([place])   . Neuropathy    Diabetes  . OSA (obstructive sleep apnea)   . Osteoarthritis   . Renal cancer ([place])    Left Kidney Removed  . RLS (restless legs syndrome)   . Syncope   . Venous stasis     Current Outpatient Medications on File Prior to Visit  Medication Sig Dispense Refill  . albuterol (PROAIR HFA) 108 (90 Base) MCG/ACT inhaler Inhale 1-2 puffs into the lungs every 6 (six) hours as needed for wheezing or shortness of breath. 1 Inhaler 5  . budesonide-formoterol (SYMBICORT) 160-4.5 MCG/ACT inhaler Inhale 2 puffs into the lungs 2 (two) times daily. 1 Inhaler 5  .  clotrimazole-betamethasone (LOTRISONE) cream Apply 1 application topically 2 (two) times daily as needed. 45 g 0  . diltiazem (CARDIZEM CD) 180 MG 24 hr capsule Take 1 capsule (180 mg total) by mouth daily. 30 capsule 1  . guaiFENesin (MUCINEX) 600 MG 12 hr tablet Take 1 tablet (600 mg total) by mouth 2 (two) times daily. 20 tablet 0  . HUMALOG 100 UNIT/ML injection INJECT 0.15 ML (15 UNITS) INTO THE SKIN 3 TIMES DAILY AS NEEDED FOR HIGH BLOOD SUGAR (Patient taking differently: Inject 15 Units into the skin 3 (three) times daily as needed for high blood sugar. Uses sliding scale. Sugar over 200 will give insulin.) 10 mL 0  . insulin glargine (LANTUS) 100 UNIT/ML injection INJECT 0.3MLS (30 UNITS TOTAL) INTO THE SKIN EVERY DAY (Patient taking differently: Inject 30 Units into the skin every morning. INJECT 0.3MLS (30 UNITS TOTAL) INTO THE SKIN EVERY DAY) 30 mL 3  . ipratropium-albuterol (DUONEB) 0.5-2.5 (3) MG/3ML SOLN Take 3 mLs by nebulization 4 (four) times daily. Dx: J44.9 360 mL 5  . levothyroxine (SYNTHROID, LEVOTHROID) 88 MCG tablet Take 1 tablet (88 mcg total) by mouth daily before breakfast. (Patient taking differently: Take 88 mcg by mouth at bedtime. ) 90 tablet 1  . metoCLOPramide (REGLAN) 10 MG tablet Take 1 tablet (10 mg  total) by mouth every 6 (six) hours as needed for nausea. (Patient taking differently: Take 10 mg by mouth 2 (two) times daily. ) 120 tablet 1  . morphine (MS CONTIN) 30 MG 12 hr tablet Take 30 mg by mouth every 12 (twelve) hours.  0  . Multiple Vitamins-Minerals (CENTRUM SILVER PO) Take by mouth.    . nortriptyline (PAMELOR) 25 MG capsule Take 1 capsule (25 mg total) by mouth at bedtime. 90 capsule 1  . nystatin (MYCOSTATIN/NYSTOP) powder APPLY TOPICALLY TWICE DAILY AS NEEDED FOR YEAST INFECTION 15 g 2  . OXYGEN O2 3L at home.    . pravastatin (PRAVACHOL) 20 MG tablet TAKE ONE TABLET BY MOUTH EVERY DAY 90 tablet 1  . Probiotic Product (PROBIOTIC PO) Take by mouth.    .  torsemide (DEMADEX) 20 MG tablet Take 1 tablet (20 mg total) by mouth daily. 30 tablet 1  . SURE COMFORT INSULIN SYRINGE 30G X 1/2" 1 ML MISC USE FOUR (4) TIMES DAILY AS DIRECTED (Patient taking differently: Inject 1 Syringe into the skin 4 (four) times daily. ) 100 each 3  . tiZANidine (ZANAFLEX) 2 MG tablet Take 2-4 mg by mouth every 12 (twelve) hours as needed for muscle spasms.    . varenicline (CHANTIX PAK) 0.5 MG X 11 & 1 MG X 42 tablet Take one 0.5 mg tablet by mouth QD x 3 days, then increase to one 0.5 mg tablet BID x 4 days, then increase to one 1 mg tablet BID. (Patient not taking: Reported on 05/02/2018) 53 tablet 0   No current facility-administered medications on file prior to visit.     Allergies  Allergen Reactions  . Gabapentin Anaphylaxis  . Lyrica [Pregabalin] Shortness Of Breath    Trouble breathing  . Ketorolac Tromethamine Hives  . Lisinopril Cough    Family History  Problem Relation Age of Onset  . Heart attack Mother 45       Deceased  . Heart disease Mother   . Emphysema Mother   . Alcoholism Mother   . COPD Father 45       Deceased  . Emphysema Father   . Alcoholism Father   . Esophageal varices Father   . Alcoholism Paternal Grandfather   . Diabetes Maternal Grandmother   . Heart disease Maternal Grandmother   . Lung cancer Maternal Grandfather   . Emphysema Maternal Grandfather   . Brain cancer Maternal Aunt   . Diabetes Sister   . Heart defect Sister   . Cancer Sister        was told her sister had cancer but beat it and dont know which one   . Heart defect Sister   . Obesity Son   . Breast cancer Maternal Aunt   . Colon cancer Neg Hx   . Esophageal cancer Neg Hx     Social History   Socioeconomic History  . Marital status: Widowed    Spouse name: Not on file  . Number of children: 1  . Years of education: Not on file  . Highest education level: Not on file  Occupational History  . Occupation: Retired  Scientific laboratory technician  . Financial  resource strain: Not on file  . Food insecurity:    Worry: Not on file    Inability: Not on file  . Transportation needs:    Medical: Not on file    Non-medical: Not on file  Tobacco Use  . Smoking status: Former Smoker    Packs/day: 2.50  Years: 45.00    Pack years: 112.50    Types: Cigarettes    Last attempt to quit: 07/21/2016    Years since quitting: 1.7  . Smokeless tobacco: Never Used  Substance and Sexual Activity  . Alcohol use: No  . Drug use: No  . Sexual activity: Never  Lifestyle  . Physical activity:    Days per week: Not on file    Minutes per session: Not on file  . Stress: Not on file  Relationships  . Social connections:    Talks on phone: Not on file    Gets together: Not on file    Attends religious service: Not on file    Active member of club or organization: Not on file    Attends meetings of clubs or organizations: Not on file    Relationship status: Not on file  Other Topics Concern  . Not on file  Social History Narrative  . Not on file   Review of Systems - See HPI.  All other ROS are negative.  BP (!) 130/58   Pulse 92   Temp 98.5 F (36.9 C) (Oral)   Resp 20   Ht '5\' 4"'$  (1.626 m)   Wt 251 lb 6.4 oz (114 kg)   SpO2 95% Comment: 3 L  BMI 43.15 kg/m   Physical Exam Vitals signs reviewed.  Constitutional:      Appearance: Normal appearance.  HENT:     Right Ear: Tympanic membrane normal.     Left Ear: Tympanic membrane normal.  Cardiovascular:     Rate and Rhythm: Normal rate and regular rhythm.     Pulses: Normal pulses.     Heart sounds: Normal heart sounds.  Pulmonary:     Effort: Pulmonary effort is normal.  Musculoskeletal:     Left shoulder: She exhibits normal range of motion, no tenderness and normal strength.     Left hip: She exhibits tenderness. She exhibits normal range of motion, normal strength, no bony tenderness and no deformity.     Cervical back: Normal.       Legs:  Neurological:     Mental Status: She is  alert.     Recent Results (from the past 2160 hour(s))  Basic metabolic panel     Status: Abnormal   Collection Time: 02/24/18  5:28 PM  Result Value Ref Range   Sodium 146 (H) 135 - 145 mmol/L   Potassium 4.0 3.5 - 5.1 mmol/L   Chloride 98 98 - 111 mmol/L   CO2 37 (H) 22 - 32 mmol/L   Glucose, Bld 62 (L) 70 - 99 mg/dL   BUN 25 (H) 8 - 23 mg/dL   Creatinine, Ser 1.48 (H) 0.44 - 1.00 mg/dL   Calcium 9.2 8.9 - 10.3 mg/dL   GFR calc non Af Amer 36 (L) >60 mL/min   GFR calc Af Amer 42 (L) >60 mL/min    Comment: (NOTE) The eGFR has been calculated using the CKD EPI equation. This calculation has not been validated in all clinical situations. eGFR's persistently <60 mL/min signify possible Chronic Kidney Disease.    Anion gap 11 5 - 15    Comment: Performed at [facility], [place] [address] [place], [place] [address]  CBC with Differential/Platelet     Status: Abnormal   Collection Time: 02/24/18  5:28 PM  Result Value Ref Range   WBC 6.1 4.0 - 10.5 K/uL   RBC 3.82 (L) 3.87 - 5.11 MIL/uL  Hemoglobin 9.6 (L) 12.0 - 15.0 g/dL   HCT 34.8 (L) 36.0 - 46.0 %   MCV 91.1 80.0 - 100.0 fL   MCH 25.1 (L) 26.0 - 34.0 pg   MCHC 27.6 (L) 30.0 - 36.0 g/dL   RDW 17.1 (H) 11.5 - 15.5 %   Platelets 85 (L) 150 - 400 K/uL    Comment: REPEATED TO VERIFY PLATELET COUNT CONFIRMED BY SMEAR SPECIMEN CHECKED FOR CLOTS Immature Platelet Fraction may be clinically indicated, consider ordering this additional test [id number]    nRBC 0.0 0.0 - 0.2 %   Neutrophils Relative % 78 %   Neutro Abs 4.8 1.7 - 7.7 K/uL   Lymphocytes Relative 12 %   Lymphs Abs 0.7 0.7 - 4.0 K/uL   Monocytes Relative 8 %   Monocytes Absolute 0.5 0.1 - 1.0 K/uL   Eosinophils Relative 1 %   Eosinophils Absolute 0.1 0.0 - 0.5 K/uL   Basophils Relative 0 %   Basophils Absolute 0.0 0.0 - 0.1 K/uL   Immature Granulocytes 1 %   Abs Immature Granulocytes 0.06 0.00 - 0.07 K/uL    Comment: Performed at [facility], [place] [address], [place], [place] [address]  Type and screen [place]     Status: None   Collection Time: 02/24/18 10:31 PM  Result Value Ref Range   ABO/RH(D) A POS    Antibody Screen NEG    Sample Expiration      02/27/2018 Performed at [facility], [place] [address] [place], [place] [address]   ABO/Rh     Status: None   Collection Time: 02/24/18 10:31 PM  Result Value Ref Range   ABO/RH(D)      A POS Performed at [facility], [place] [address] [place], [place] [address]   Vitamin B12     Status: None   Collection Time: 02/24/18 10:38 PM  Result Value Ref Range   Vitamin B-12 444 180 - 914 pg/mL    Comment: (NOTE) This assay is not validated for testing neonatal or myeloproliferative syndrome specimens for Vitamin B12 levels. Performed at [facility], [place] [address], [place], [place] [address]   Folate     Status: None   Collection Time: 02/24/18 10:38 PM  Result Value Ref Range   Folate 10.4 >5.9 ng/mL    Comment: Performed at [facility], [place] [address], [place], [place] [address]  Iron and TIBC     Status: Abnormal   Collection Time: 02/24/18 10:38 PM  Result Value Ref Range   Iron 20 (L) 28 - 170 ug/dL   TIBC 240 (L) 250 - 450 ug/dL   Saturation Ratios 8 (L) 10.4 - 31.8 %   UIBC 220 ug/dL    Comment: Performed at [facility], [place] [address], [place], [place] [address]  Ferritin     Status: None   Collection Time: 02/24/18 10:38 PM  Result Value Ref Range   Ferritin 95 11 - 307 ng/mL    Comment: Performed at [facility], [place] [address], [place], [place] [address]  Reticulocytes     Status: Abnormal   Collection Time: 02/24/18 10:38 PM  Result Value Ref Range   Retic Ct Pct 1.7 0.4 - 3.1 %   RBC. 4.17 3.87 - 5.11 MIL/uL   Retic Count, Absolute 69.6 19.0 - 186.0 K/uL   Immature Retic Fract 21.9 (H) 2.3 -  15.9 %  Comment: Performed at [facility], [place] [address], [place], [place] [address]  Procalcitonin - Baseline     Status: None   Collection Time: 02/24/18 10:38 PM  Result Value Ref Range   Procalcitonin <0.10 ng/mL    Comment:        Interpretation: PCT (Procalcitonin) <= 0.5 ng/mL: Systemic infection (sepsis) is not likely. Local bacterial infection is possible. (NOTE)       Sepsis PCT Algorithm           Lower Respiratory Tract                                      Infection PCT Algorithm    ----------------------------     ----------------------------         PCT < 0.25 ng/mL                PCT < 0.10 ng/mL         Strongly encourage             Strongly discourage   discontinuation of antibiotics    initiation of antibiotics    ----------------------------     -----------------------------       PCT 0.25 - 0.50 ng/mL            PCT 0.10 - 0.25 ng/mL               OR       >80% decrease in PCT            Discourage initiation of                                            antibiotics      Encourage discontinuation           of antibiotics    ----------------------------     -----------------------------         PCT >= 0.50 ng/mL              PCT 0.26 - 0.50 ng/mL               AND        <80% decrease in PCT             Encourage initiation of                                             antibiotics       Encourage continuation           of antibiotics    ----------------------------     -----------------------------        PCT >= 0.50 ng/mL                  PCT > 0.50 ng/mL               AND         increase in PCT                  Strongly encourage  initiation of antibiotics    Strongly encourage escalation           of antibiotics                                     -----------------------------                                           PCT <= 0.25 ng/mL                                                 OR                                         > 80% decrease in PCT                                     Discontinue / Do not initiate                                             antibiotics Performed at [place] [address] [place], [place] [address]   Glucose, capillary     Status: Abnormal   Collection Time: 02/25/18 12:12 AM  Result Value Ref Range   Glucose-Capillary 222 (H) 70 - 99 mg/dL   Comment 1 Notify RN   HIV antibody (Routine Testing)     Status: None   Collection Time: 02/25/18  5:48 AM  Result Value Ref Range   HIV Screen 4th Generation wRfx Non Reactive Non Reactive    Comment: (NOTE) Performed At: [facility] [place], [place] [id number] Rush Farmer MD IF:[id number]   Basic metabolic panel     Status: Abnormal   Collection Time: 02/25/18  5:48 AM  Result Value Ref Range   Sodium 141 135 - 145 mmol/L   Potassium 5.4 (H) 3.5 - 5.1 mmol/L    Comment: DELTA CHECK NOTED SLIGHT HEMOLYSIS    Chloride 97 (L) 98 - 111 mmol/L   CO2 37 (H) 22 - 32 mmol/L   Glucose, Bld 201 (H) 70 - 99 mg/dL   BUN 29 (H) 8 - 23 mg/dL   Creatinine, Ser 1.50 (H) 0.44 - 1.00 mg/dL   Calcium 8.6 (L) 8.9 - 10.3 mg/dL   GFR calc non Af Amer 36 (L) >60 mL/min   GFR calc Af Amer 41 (L) >60 mL/min    Comment: (NOTE) The eGFR has been calculated using the CKD EPI equation. This calculation has not been validated in all clinical situations. eGFR's persistently <60 mL/min signify possible Chronic Kidney Disease.    Anion gap 7 5 - 15    Comment: Performed at [facility], [place] [address], [place], [place] [address]  CBC WITH DIFFERENTIAL     Status: Abnormal   Collection Time: 02/25/18  5:48 AM  Result Value Ref  Range   WBC 3.9 (L) 4.0 - 10.5 K/uL   RBC 3.77 (L) 3.87 - 5.11 MIL/uL   Hemoglobin 9.6 (L) 12.0 - 15.0 g/dL   HCT 34.4 (L) 36.0 - 46.0 %   MCV 91.2 80.0 - 100.0 fL   MCH 25.5 (L) 26.0 - 34.0 pg   MCHC 27.9 (L) 30.0 - 36.0  g/dL   RDW 16.9 (H) 11.5 - 15.5 %   Platelets 69 (L) 150 - 400 K/uL    Comment: REPEATED TO VERIFY Immature Platelet Fraction may be clinically indicated, consider ordering this additional test [id number] CONSISTENT WITH PREVIOUS RESULT    nRBC 0.0 0.0 - 0.2 %   Neutrophils Relative % 91 %   Neutro Abs 3.6 1.7 - 7.7 K/uL   Lymphocytes Relative 5 %   Lymphs Abs 0.2 (L) 0.7 - 4.0 K/uL   Monocytes Relative 2 %   Monocytes Absolute 0.1 0.1 - 1.0 K/uL   Eosinophils Relative 0 %   Eosinophils Absolute 0.0 0.0 - 0.5 K/uL   Basophils Relative 0 %   Basophils Absolute 0.0 0.0 - 0.1 K/uL   Immature Granulocytes 2 %   Abs Immature Granulocytes 0.08 (H) 0.00 - 0.07 K/uL    Comment: Performed at [facility], [place] [address] [place], [place] [address]  Glucose, capillary     Status: Abnormal   Collection Time: 02/25/18  7:42 AM  Result Value Ref Range   Glucose-Capillary 173 (H) 70 - 99 mg/dL  Glucose, capillary     Status: Abnormal   Collection Time: 02/25/18 12:08 PM  Result Value Ref Range   Glucose-Capillary 275 (H) 70 - 99 mg/dL  Glucose, capillary     Status: Abnormal   Collection Time: 02/25/18  4:48 PM  Result Value Ref Range   Glucose-Capillary 220 (H) 70 - 99 mg/dL  Glucose, capillary     Status: Abnormal   Collection Time: 02/25/18  8:55 PM  Result Value Ref Range   Glucose-Capillary 235 (H) 70 - 99 mg/dL   Comment 1 Notify RN   CBC with Differential/Platelet     Status: Abnormal   Collection Time: 02/26/18  4:53 AM  Result Value Ref Range   WBC 7.6 4.0 - 10.5 K/uL   RBC 3.93 3.87 - 5.11 MIL/uL   Hemoglobin 9.9 (L) 12.0 - 15.0 g/dL   HCT 36.3 36.0 - 46.0 %   MCV 92.4 80.0 - 100.0 fL   MCH 25.2 (L) 26.0 - 34.0 pg   MCHC 27.3 (L) 30.0 - 36.0 g/dL   RDW 16.6 (H) 11.5 - 15.5 %   Platelets 82 (L) 150 - 400 K/uL    Comment: Immature Platelet Fraction may be clinically indicated, consider ordering this additional test [id number] CONSISTENT WITH PREVIOUS  RESULT    nRBC 0.0 0.0 - 0.2 %   Neutrophils Relative % 92 %   Neutro Abs 7.0 1.7 - 7.7 K/uL   Lymphocytes Relative 4 %   Lymphs Abs 0.3 (L) 0.7 - 4.0 K/uL   Monocytes Relative 3 %   Monocytes Absolute 0.3 0.1 - 1.0 K/uL   Eosinophils Relative 0 %   Eosinophils Absolute 0.0 0.0 - 0.5 K/uL   Basophils Relative 0 %   Basophils Absolute 0.0 0.0 - 0.1 K/uL   Immature Granulocytes 1 %   Abs Immature Granulocytes 0.09 (H) 0.00 - 0.07 K/uL    Comment: Performed at [facility], [place] [address] [place], [place] [address]  Renal  function panel     Status: Abnormal   Collection Time: 02/26/18  4:53 AM  Result Value Ref Range   Sodium 140 135 - 145 mmol/L   Potassium 5.6 (H) 3.5 - 5.1 mmol/L   Chloride 95 (L) 98 - 111 mmol/L   CO2 35 (H) 22 - 32 mmol/L   Glucose, Bld 191 (H) 70 - 99 mg/dL   BUN 31 (H) 8 - 23 mg/dL   Creatinine, Ser 1.42 (H) 0.44 - 1.00 mg/dL   Calcium 9.1 8.9 - 10.3 mg/dL   Phosphorus 3.5 2.5 - 4.6 mg/dL   Albumin 3.3 (L) 3.5 - 5.0 g/dL   GFR calc non Af Amer 38 (L) >60 mL/min   GFR calc Af Amer 44 (L) >60 mL/min    Comment: (NOTE) The eGFR has been calculated using the CKD EPI equation. This calculation has not been validated in all clinical situations. eGFR's persistently <60 mL/min signify possible Chronic Kidney Disease.    Anion gap 10 5 - 15    Comment: Performed at [facility], [place] [address], [place], [place] [address]  Glucose, capillary     Status: Abnormal   Collection Time: 02/26/18  7:34 AM  Result Value Ref Range   Glucose-Capillary 192 (H) 70 - 99 mg/dL  Glucose, capillary     Status: Abnormal   Collection Time: 02/26/18 11:50 AM  Result Value Ref Range   Glucose-Capillary 211 (H) 70 - 99 mg/dL  Glucose, capillary     Status: Abnormal   Collection Time: 02/26/18  4:49 PM  Result Value Ref Range   Glucose-Capillary 229 (H) 70 - 99 mg/dL  Glucose, capillary     Status: Abnormal   Collection Time: 02/26/18   9:00 PM  Result Value Ref Range   Glucose-Capillary 194 (H) 70 - 99 mg/dL  Basic metabolic panel     Status: Abnormal   Collection Time: 02/27/18  5:15 AM  Result Value Ref Range   Sodium 136 135 - 145 mmol/L   Potassium 5.0 3.5 - 5.1 mmol/L   Chloride 88 (L) 98 - 111 mmol/L   CO2 38 (H) 22 - 32 mmol/L   Glucose, Bld 125 (H) 70 - 99 mg/dL   BUN 45 (H) 8 - 23 mg/dL   Creatinine, Ser 1.57 (H) 0.44 - 1.00 mg/dL   Calcium 9.0 8.9 - 10.3 mg/dL   GFR calc non Af Amer 34 (L) >60 mL/min   GFR calc Af Amer 39 (L) >60 mL/min    Comment: (NOTE) The eGFR has been calculated using the CKD EPI equation. This calculation has not been validated in all clinical situations. eGFR's persistently <60 mL/min signify possible Chronic Kidney Disease.    Anion gap 10 5 - 15    Comment: Performed at [facility], [place] [address], [place], [place] [address]  Glucose, capillary     Status: Abnormal   Collection Time: 02/27/18  8:09 AM  Result Value Ref Range   Glucose-Capillary 109 (H) 70 - 99 mg/dL  Glucose, capillary     Status: Abnormal   Collection Time: 02/27/18 11:53 AM  Result Value Ref Range   Glucose-Capillary 173 (H) 70 - 99 mg/dL   Comment 1 Notify RN    Comment 2 Document in Chart   Glucose, capillary     Status: Abnormal   Collection Time: 02/27/18  4:51 PM  Result Value Ref Range   Glucose-Capillary 204 (H) 70 - 99 mg/dL   Comment  1 Notify RN    Comment 2 Document in Chart   Glucose, capillary     Status: Abnormal   Collection Time: 02/27/18  9:15 PM  Result Value Ref Range   Glucose-Capillary 169 (H) 70 - 99 mg/dL  Comprehensive metabolic panel     Status: Abnormal   Collection Time: 02/28/18  4:21 AM  Result Value Ref Range   Sodium 139 135 - 145 mmol/L   Potassium 4.3 3.5 - 5.1 mmol/L   Chloride 86 (L) 98 - 111 mmol/L   CO2 40 (H) 22 - 32 mmol/L   Glucose, Bld 154 (H) 70 - 99 mg/dL   BUN 55 (H) 8 - 23 mg/dL   Creatinine, Ser 1.93 (H) 0.44 - 1.00 mg/dL    Calcium 8.7 (L) 8.9 - 10.3 mg/dL   Total Protein 6.1 (L) 6.5 - 8.1 g/dL   Albumin 3.1 (L) 3.5 - 5.0 g/dL   AST 27 15 - 41 U/L   ALT 36 0 - 44 U/L   Alkaline Phosphatase 55 38 - 126 U/L   Total Bilirubin 0.5 0.3 - 1.2 mg/dL   GFR calc non Af Amer 26 (L) >60 mL/min   GFR calc Af Amer 30 (L) >60 mL/min    Comment: (NOTE) The eGFR has been calculated using the CKD EPI equation. This calculation has not been validated in all clinical situations. eGFR's persistently <60 mL/min signify possible Chronic Kidney Disease.    Anion gap 13 5 - 15    Comment: Performed at [facility], [place] [address], [place],  [address]  CBC with Differential/Platelet     Status: Abnormal   Collection Time: 02/28/18  4:21 AM  Result Value Ref Range   WBC 7.6 4.0 - 10.5 K/uL   RBC 3.80 (L) 3.87 - 5.11 MIL/uL   Hemoglobin 9.4 (L) 12.0 - 15.0 g/dL   HCT 34.1 (L) 36.0 - 46.0 %   MCV 89.7 80.0 - 100.0 fL   MCH 24.7 (L) 26.0 - 34.0 pg   MCHC 27.6 (L) 30.0 - 36.0 g/dL   RDW 16.7 (H) 11.5 - 15.5 %   Platelets 85 (L) 150 - 400 K/uL    Comment: Immature Platelet Fraction may be clinically indicated, consider ordering this additional test [id number]    nRBC 0.0 0.0 - 0.2 %   Neutrophils Relative % 81 %   Neutro Abs 6.1 1.7 - 7.7 K/uL   Lymphocytes Relative 7 %   Lymphs Abs 0.5 (L) 0.7 - 4.0 K/uL   Monocytes Relative 11 %   Monocytes Absolute 0.8 0.1 - 1.0 K/uL   Eosinophils Relative 0 %   Eosinophils Absolute 0.0 0.0 - 0.5 K/uL   Basophils Relative 0 %   Basophils Absolute 0.0 0.0 - 0.1 K/uL   Immature Granulocytes 1 %   Abs Immature Granulocytes 0.09 (H) 0.00 - 0.07 K/uL    Comment: Performed at [facility], [place] [address], [place],  [address]  Glucose, capillary     Status: Abnormal   Collection Time: 02/28/18  7:16 AM  Result Value Ref Range   Glucose-Capillary 128 (H) 70 - 99 mg/dL  Glucose, capillary     Status: Abnormal   Collection Time: 02/28/18  11:52 AM  Result Value Ref Range   Glucose-Capillary 146 (H) 70 - 99 mg/dL   Comment 1 Notify RN    Comment 2 Document in Chart   Glucose, capillary     Status: Abnormal  Collection Time: 02/28/18  4:33 PM  Result Value Ref Range   Glucose-Capillary 243 (H) 70 - 99 mg/dL   Comment 1 Notify RN    Comment 2 Document in Chart   Glucose, capillary     Status: Abnormal   Collection Time: 02/28/18  9:44 PM  Result Value Ref Range   Glucose-Capillary 182 (H) 70 - 99 mg/dL  Comprehensive metabolic panel     Status: Abnormal   Collection Time: 03/01/18  5:36 AM  Result Value Ref Range   Sodium 138 135 - 145 mmol/L   Potassium 4.4 3.5 - 5.1 mmol/L   Chloride 88 (L) 98 - 111 mmol/L   CO2 40 (H) 22 - 32 mmol/L   Glucose, Bld 150 (H) 70 - 99 mg/dL   BUN 68 (H) 8 - 23 mg/dL   Creatinine, Ser 2.02 (H) 0.44 - 1.00 mg/dL   Calcium 8.8 (L) 8.9 - 10.3 mg/dL   Total Protein 7.0 6.5 - 8.1 g/dL   Albumin 3.7 3.5 - 5.0 g/dL   AST 17 15 - 41 U/L   ALT 29 0 - 44 U/L   Alkaline Phosphatase 57 38 - 126 U/L   Total Bilirubin 0.7 0.3 - 1.2 mg/dL   GFR calc non Af Amer 25 (L) >60 mL/min   GFR calc Af Amer 29 (L) >60 mL/min    Comment: (NOTE) The eGFR has been calculated using the CKD EPI equation. This calculation has not been validated in all clinical situations. eGFR's persistently <60 mL/min signify possible Chronic Kidney Disease.    Anion gap 10 5 - 15    Comment: Performed at [facility], [place] [address] [place], [place] [address]  CBC with Differential/Platelet     Status: Abnormal   Collection Time: 03/01/18  5:36 AM  Result Value Ref Range   WBC 7.1 4.0 - 10.5 K/uL   RBC 4.41 3.87 - 5.11 MIL/uL   Hemoglobin 11.1 (L) 12.0 - 15.0 g/dL   HCT 38.9 36.0 - 46.0 %   MCV 88.2 80.0 - 100.0 fL   MCH 25.2 (L) 26.0 - 34.0 pg   MCHC 28.5 (L) 30.0 - 36.0 g/dL   RDW 16.7 (H) 11.5 - 15.5 %   Platelets 90 (L) 150 - 400 K/uL    Comment: Immature Platelet Fraction may  be clinically indicated, consider ordering this additional test [id number]    nRBC 0.0 0.0 - 0.2 %   Neutrophils Relative % 85 %   Neutro Abs 6.0 1.7 - 7.7 K/uL   Lymphocytes Relative 6 %   Lymphs Abs 0.4 (L) 0.7 - 4.0 K/uL   Monocytes Relative 7 %   Monocytes Absolute 0.5 0.1 - 1.0 K/uL   Eosinophils Relative 0 %   Eosinophils Absolute 0.0 0.0 - 0.5 K/uL   Basophils Relative 0 %   Basophils Absolute 0.0 0.0 - 0.1 K/uL   Immature Granulocytes 2 %   Abs Immature Granulocytes 0.12 (H) 0.00 - 0.07 K/uL    Comment: Performed at [facility], [place] [address] [place], [place] [address]  Phosphorus     Status: None   Collection Time: 03/01/18  5:36 AM  Result Value Ref Range   Phosphorus 4.6 2.5 - 4.6 mg/dL    Comment: Performed at [facility], [place] [address], [place], [place] [address]  Glucose, capillary     Status: Abnormal   Collection Time: 03/01/18  7:38 AM  Result Value Ref Range  Glucose-Capillary 137 (H) 70 - 99 mg/dL   Comment 1 Notify RN    Comment 2 Document in Chart   Glucose, capillary     Status: Abnormal   Collection Time: 03/01/18  1:10 PM  Result Value Ref Range   Glucose-Capillary 119 (H) 70 - 99 mg/dL   Comment 1 Notify RN    Comment 2 Document in Chart   Glucose, capillary     Status: Abnormal   Collection Time: 03/01/18  5:20 PM  Result Value Ref Range   Glucose-Capillary 192 (H) 70 - 99 mg/dL  Glucose, capillary     Status: Abnormal   Collection Time: 03/01/18  8:58 PM  Result Value Ref Range   Glucose-Capillary 152 (H) 70 - 99 mg/dL   Comment 1 Notify RN   Comprehensive metabolic panel     Status: Abnormal   Collection Time: 03/02/18  3:57 AM  Result Value Ref Range   Sodium 140 135 - 145 mmol/L   Potassium 4.1 3.5 - 5.1 mmol/L   Chloride 95 (L) 98 - 111 mmol/L   CO2 36 (H) 22 - 32 mmol/L   Glucose, Bld 140 (H) 70 - 99 mg/dL   BUN 67 (H) 8 - 23 mg/dL   Creatinine, Ser 1.98 (H) 0.44 - 1.00 mg/dL   Calcium 8.5  (L) 8.9 - 10.3 mg/dL   Total Protein 6.1 (L) 6.5 - 8.1 g/dL   Albumin 3.2 (L) 3.5 - 5.0 g/dL   AST 14 (L) 15 - 41 U/L   ALT 22 0 - 44 U/L   Alkaline Phosphatase 47 38 - 126 U/L   Total Bilirubin 0.5 0.3 - 1.2 mg/dL   GFR calc non Af Amer 26 (L) >60 mL/min   GFR calc Af Amer 30 (L) >60 mL/min    Comment: (NOTE) The eGFR has been calculated using the CKD EPI equation. This calculation has not been validated in all clinical situations. eGFR's persistently <60 mL/min signify possible Chronic Kidney Disease.    Anion gap 9 5 - 15    Comment: Performed at [facility], [place] [address], [place], [place] [address]  CBC with Differential/Platelet     Status: Abnormal   Collection Time: 03/02/18  3:57 AM  Result Value Ref Range   WBC 5.5 4.0 - 10.5 K/uL   RBC 3.83 (L) 3.87 - 5.11 MIL/uL   Hemoglobin 9.6 (L) 12.0 - 15.0 g/dL   HCT 33.8 (L) 36.0 - 46.0 %   MCV 88.3 80.0 - 100.0 fL   MCH 25.1 (L) 26.0 - 34.0 pg   MCHC 28.4 (L) 30.0 - 36.0 g/dL   RDW 16.8 (H) 11.5 - 15.5 %   Platelets 79 (L) 150 - 400 K/uL    Comment: Immature Platelet Fraction may be clinically indicated, consider ordering this additional test [id number]    nRBC 0.0 0.0 - 0.2 %   Neutrophils Relative % 78 %   Neutro Abs 4.4 1.7 - 7.7 K/uL   Lymphocytes Relative 11 %   Lymphs Abs 0.6 (L) 0.7 - 4.0 K/uL   Monocytes Relative 8 %   Monocytes Absolute 0.4 0.1 - 1.0 K/uL   Eosinophils Relative 0 %   Eosinophils Absolute 0.0 0.0 - 0.5 K/uL   Basophils Relative 0 %   Basophils Absolute 0.0 0.0 - 0.1 K/uL   Immature Granulocytes 3 %   Abs Immature Granulocytes 0.15 (H) 0.00 - 0.07 K/uL    Comment: Performed at [facility],  [place] [address] [place], [place] [address]  Glucose, capillary     Status: Abnormal   Collection Time: 03/02/18  7:28 AM  Result Value Ref Range   Glucose-Capillary 100 (H) 70 - 99 mg/dL  Glucose, capillary     Status: Abnormal   Collection Time: 03/02/18 11:35 AM    Result Value Ref Range   Glucose-Capillary 163 (H) 70 - 99 mg/dL   Comment 1 Notify RN    Comment 2 Document in Chart   Glucose, capillary     Status: Abnormal   Collection Time: 03/02/18  4:40 PM  Result Value Ref Range   Glucose-Capillary 237 (H) 70 - 99 mg/dL   Comment 1 Notify RN    Comment 2 Document in Chart   CBC w/Diff     Status: Abnormal   Collection Time: 03/14/18 12:04 PM  Result Value Ref Range   WBC 5.6 4.0 - 10.5 K/uL   RBC 4.08 3.87 - 5.11 Mil/uL   Hemoglobin 10.6 (L) 12.0 - 15.0 g/dL   HCT 34.3 (L) 36.0 - 46.0 %   MCV 84.0 78.0 - 100.0 fl   MCHC 31.0 30.0 - 36.0 g/dL   RDW 19.2 (H) 11.5 - 15.5 %   Platelets 61.0 (L) 150.0 - 400.0 K/uL   Neutrophils Relative % 77.7 (H) 43.0 - 77.0 %   Lymphocytes Relative 12.6 12.0 - 46.0 %   Monocytes Relative 8.2 3.0 - 12.0 %   Eosinophils Relative 1.3 0.0 - 5.0 %   Basophils Relative 0.2 0.0 - 3.0 %   Neutro Abs 4.3 1.4 - 7.7 K/uL   Lymphs Abs 0.7 0.7 - 4.0 K/uL   Monocytes Absolute 0.5 0.1 - 1.0 K/uL   Eosinophils Absolute 0.1 0.0 - 0.7 K/uL   Basophils Absolute 0.0 0.0 - 0.1 K/uL  Comp Met (CMET)     Status: Abnormal   Collection Time: 03/14/18 12:04 PM  Result Value Ref Range   Sodium 140 135 - 145 mEq/L   Potassium 4.9 3.5 - 5.1 mEq/L   Chloride 99 96 - 112 mEq/L   CO2 39 (H) 19 - 32 mEq/L   Glucose, Bld 61 (L) 70 - 99 mg/dL   BUN 30 (H) 6 - 23 mg/dL   Creatinine, Ser 1.18 0.40 - 1.20 mg/dL   Total Bilirubin 0.4 0.2 - 1.2 mg/dL   Alkaline Phosphatase 54 39 - 117 U/L   AST 14 0 - 37 U/L   ALT 12 0 - 35 U/L   Total Protein 6.3 6.0 - 8.3 g/dL   Albumin 3.8 3.5 - 5.2 g/dL   Calcium 8.9 8.4 - 10.5 mg/dL   GFR 48.91 (L) >60.00 mL/min  Comprehensive metabolic panel     Status: Abnormal   Collection Time: 04/07/18  4:42 PM  Result Value Ref Range   Sodium 138 135 - 145 mmol/L   Potassium 4.4 3.5 - 5.1 mmol/L   Chloride 93 (L) 98 - 111 mmol/L   CO2 38 (H) 22 - 32 mmol/L   Glucose, Bld 156 (H) 70 - 99 mg/dL    BUN 16 8 - 23 mg/dL   Creatinine, Ser 1.38 (H) 0.44 - 1.00 mg/dL   Calcium 8.2 (L) 8.9 - 10.3 mg/dL   Total Protein 5.8 (L) 6.5 - 8.1 g/dL   Albumin 3.1 (L) 3.5 - 5.0 g/dL   AST 13 (L) 15 - 41 U/L   ALT 10 0 - 44 U/L   Alkaline Phosphatase 46 38 - 126  U/L   Total Bilirubin 0.8 0.3 - 1.2 mg/dL   GFR calc non Af Amer 39 (L) >60 mL/min   GFR calc Af Amer 46 (L) >60 mL/min    Comment: (NOTE) The eGFR has been calculated using the CKD EPI equation. This calculation has not been validated in all clinical situations. eGFR's persistently <60 mL/min signify possible Chronic Kidney Disease.    Anion gap 7 5 - 15    Comment: Performed at [facility], [address] [place], [place] [address]  Brain natriuretic peptide     Status: Abnormal   Collection Time: 04/07/18  4:42 PM  Result Value Ref Range   B Natriuretic Peptide 597.0 (H) 0.0 - 100.0 pg/mL    Comment: Performed at [facility], [address] [place], [place] [address]  Troponin I - Once     Status: Abnormal   Collection Time: 04/07/18  4:42 PM  Result Value Ref Range   Troponin I 0.03 (HH) <0.03 ng/mL    Comment: CRITICAL RESULT CALLED TO, READ BACK BY AND VERIFIED WITH: OAKLEY,B ON 04/07/18 AT 1740 BY LOY,C Performed at [facility], [address], [place], [place] [address]   CBC with Differential     Status: Abnormal   Collection Time: 04/07/18  4:42 PM  Result Value Ref Range   WBC 4.5 4.0 - 10.5 K/uL   RBC 4.02 3.87 - 5.11 MIL/uL   Hemoglobin 9.6 (L) 12.0 - 15.0 g/dL   HCT 35.9 (L) 36.0 - 46.0 %   MCV 89.3 80.0 - 100.0 fL   MCH 23.9 (L) 26.0 - 34.0 pg   MCHC 26.7 (L) 30.0 - 36.0 g/dL   RDW 17.4 (H) 11.5 - 15.5 %   Platelets 49 (L) 150 - 400 K/uL    Comment: PLATELET COUNT CONFIRMED BY SMEAR SPECIMEN CHECKED FOR CLOTS    nRBC 0.0 0.0 - 0.2 %   Neutrophils Relative % 83 %   Neutro Abs 3.7 1.7 - 7.7 K/uL   Lymphocytes Relative 8 %   Lymphs Abs 0.4 (L) 0.7 - 4.0 K/uL   Monocytes Relative 9 %   Monocytes Absolute  0.4 0.1 - 1.0 K/uL   Eosinophils Relative 0 %   Eosinophils Absolute 0.0 0.0 - 0.5 K/uL   Basophils Relative 0 %   Basophils Absolute 0.0 0.0 - 0.1 K/uL   Immature Granulocytes 0 %   Abs Immature Granulocytes 0.02 0.00 - 0.07 K/uL    Comment: Performed at [facility], [address] [place], [place] [address]  CBG monitoring, ED     Status: Abnormal   Collection Time: 04/07/18  4:55 PM  Result Value Ref Range   Glucose-Capillary 140 (H) 70 - 99 mg/dL  Urinalysis, Routine w reflex microscopic     Status: Abnormal   Collection Time: 04/07/18  6:20 PM  Result Value Ref Range   Color, Urine YELLOW YELLOW   APPearance HAZY (A) CLEAR   Specific Gravity, Urine 1.015 1.005 - 1.030   pH 6.0 5.0 - 8.0   Glucose, UA NEGATIVE NEGATIVE mg/dL   Hgb urine dipstick SMALL (A) NEGATIVE   Bilirubin Urine NEGATIVE NEGATIVE   Ketones, ur NEGATIVE NEGATIVE mg/dL   Protein, ur 30 (A) NEGATIVE mg/dL   Nitrite NEGATIVE NEGATIVE   Leukocytes, UA NEGATIVE NEGATIVE   RBC / HPF 6-10 0 - 5 RBC/hpf   WBC, UA 6-10 0 - 5 WBC/hpf   Bacteria, UA RARE (A) NONE SEEN   Squamous Epithelial / LPF  11-20 0 - 5    Comment: Performed at [facility], [address] [place], [place] [address]  Glucose, capillary     Status: Abnormal   Collection Time: 04/07/18  9:06 PM  Result Value Ref Range   Glucose-Capillary 128 (H) 70 - 99 mg/dL   Comment 1 Notify RN    Comment 2 Document in Chart   Basic metabolic panel     Status: Abnormal   Collection Time: 04/08/18  6:45 AM  Result Value Ref Range   Sodium 140 135 - 145 mmol/L   Potassium 4.3 3.5 - 5.1 mmol/L   Chloride 95 (L) 98 - 111 mmol/L   CO2 41 (H) 22 - 32 mmol/L   Glucose, Bld 114 (H) 70 - 99 mg/dL   BUN 16 8 - 23 mg/dL   Creatinine, Ser 1.35 (H) 0.44 - 1.00 mg/dL   Calcium 8.1 (L) 8.9 - 10.3 mg/dL   GFR calc non Af Amer 41 (L) >60 mL/min   GFR calc Af Amer 47 (L) >60 mL/min    Comment: (NOTE) The eGFR has been calculated using the CKD EPI equation. This  calculation has not been validated in all clinical situations. eGFR's persistently <60 mL/min signify possible Chronic Kidney Disease.    Anion gap 4 (L) 5 - 15    Comment: Performed at [facility], [address] [place], [place] [address]  Glucose, capillary     Status: None   Collection Time: 04/08/18  8:01 AM  Result Value Ref Range   Glucose-Capillary 92 70 - 99 mg/dL   Comment 1 Notify RN    Comment 2 Document in Chart   ECHOCARDIOGRAM COMPLETE     Status: None   Collection Time: 04/08/18 11:37 AM  Result Value Ref Range   Weight 4,797.21 oz   Height 64 in   BP 110/55 mmHg  Glucose, capillary     Status: Abnormal   Collection Time: 04/08/18 11:55 AM  Result Value Ref Range   Glucose-Capillary 139 (H) 70 - 99 mg/dL   Comment 1 Notify RN    Comment 2 Document in Chart   Glucose, capillary     Status: Abnormal   Collection Time: 04/08/18  4:48 PM  Result Value Ref Range   Glucose-Capillary 203 (H) 70 - 99 mg/dL   Comment 1 Notify RN    Comment 2 Document in Chart   Glucose, capillary     Status: Abnormal   Collection Time: 04/08/18 10:14 PM  Result Value Ref Range   Glucose-Capillary 282 (H) 70 - 99 mg/dL   Comment 1 Notify RN    Comment 2 Document in Chart   Basic metabolic panel     Status: Abnormal   Collection Time: 04/09/18  5:38 AM  Result Value Ref Range   Sodium 139 135 - 145 mmol/L   Potassium 4.2 3.5 - 5.1 mmol/L   Chloride 90 (L) 98 - 111 mmol/L   CO2 40 (H) 22 - 32 mmol/L   Glucose, Bld 260 (H) 70 - 99 mg/dL   BUN 23 8 - 23 mg/dL   Creatinine, Ser 1.58 (H) 0.44 - 1.00 mg/dL   Calcium 8.5 (L) 8.9 - 10.3 mg/dL   GFR calc non Af Amer 34 (L) >60 mL/min   GFR calc Af Amer 39 (L) >60 mL/min    Comment: (NOTE) The eGFR has been calculated using the CKD EPI equation. This calculation has not been validated in all clinical situations. eGFR's persistently <60  mL/min signify possible Chronic Kidney Disease.    Anion gap 9 5 - 15    Comment: Performed at  [facility], [address], [place], [place] [address]  Magnesium     Status: None   Collection Time: 04/09/18  5:38 AM  Result Value Ref Range   Magnesium 1.7 1.7 - 2.4 mg/dL    Comment: Performed at [facility], [address], [place], [place] [address]  Vitamin B12     Status: None   Collection Time: 04/09/18  5:39 AM  Result Value Ref Range   Vitamin B-12 368 180 - 914 pg/mL    Comment: (NOTE) This assay is not validated for testing neonatal or myeloproliferative syndrome specimens for Vitamin B12 levels. Performed at [facility], [address] [place], [place] [address]   Folate     Status: Abnormal   Collection Time: 04/09/18  5:39 AM  Result Value Ref Range   Folate 5.9 (L) >5.9 ng/mL    Comment: Performed at [facility], [address] [place], [place] [address]  Hemoglobin A1c     Status: None   Collection Time: 04/09/18  5:39 AM  Result Value Ref Range   Hgb A1c MFr Bld 5.2 4.8 - 5.6 %    Comment: (NOTE) Pre diabetes:          5.7%-6.4% Diabetes:              >6.4% Glycemic control for   <7.0% adults with diabetes    Mean Plasma Glucose 102.54 mg/dL    Comment: Performed at [place] [address], [place], [place] [address]  Glucose, capillary     Status: Abnormal   Collection Time: 04/09/18  7:59 AM  Result Value Ref Range   Glucose-Capillary 270 (H) 70 - 99 mg/dL   Comment 1 Notify RN    Comment 2 Document in Chart   Glucose, capillary     Status: Abnormal   Collection Time: 04/09/18 11:26 AM  Result Value Ref Range   Glucose-Capillary 225 (H) 70 - 99 mg/dL   Comment 1 Notify RN    Comment 2 Document in Chart   Glucose, capillary     Status: Abnormal   Collection Time: 04/09/18  4:41 PM  Result Value Ref Range   Glucose-Capillary 222 (H) 70 - 99 mg/dL   Comment 1 Notify RN    Comment 2 Document in Chart   Glucose, capillary     Status: Abnormal   Collection Time: 04/09/18 10:55 PM  Result Value Ref Range   Glucose-Capillary 224 (H) 70  - 99 mg/dL  Basic metabolic panel     Status: Abnormal   Collection Time: 04/10/18  5:46 AM  Result Value Ref Range   Sodium 138 135 - 145 mmol/L   Potassium 5.1 3.5 - 5.1 mmol/L    Comment: DELTA CHECK NOTED   Chloride 89 (L) 98 - 111 mmol/L   CO2 40 (H) 22 - 32 mmol/L   Glucose, Bld 210 (H) 70 - 99 mg/dL   BUN 29 (H) 8 - 23 mg/dL   Creatinine, Ser 1.37 (H) 0.44 - 1.00 mg/dL   Calcium 8.7 (L) 8.9 - 10.3 mg/dL   GFR calc non Af Amer 40 (L) >60 mL/min   GFR calc Af Amer 46 (L) >60 mL/min    Comment: (NOTE) The eGFR has been calculated using the CKD EPI equation. This calculation has not been validated in all clinical situations. eGFR's persistently <60  mL/min signify possible Chronic Kidney Disease.    Anion gap 9 5 - 15    Comment: Performed at [facility], [address], [place], [place] [address]  Glucose, capillary     Status: Abnormal   Collection Time: 04/10/18  8:11 AM  Result Value Ref Range   Glucose-Capillary 167 (H) 70 - 99 mg/dL  Glucose, capillary     Status: Abnormal   Collection Time: 04/10/18 11:38 AM  Result Value Ref Range   Glucose-Capillary 206 (H) 70 - 99 mg/dL   Comment 1 Notify RN   Glucose, capillary     Status: Abnormal   Collection Time: 04/10/18  4:58 PM  Result Value Ref Range   Glucose-Capillary 172 (H) 70 - 99 mg/dL   Comment 1 Notify RN    Comment 2 Document in Chart   Glucose, capillary     Status: Abnormal   Collection Time: 04/10/18 10:19 PM  Result Value Ref Range   Glucose-Capillary 182 (H) 70 - 99 mg/dL  Basic metabolic panel     Status: Abnormal   Collection Time: 04/11/18  5:09 AM  Result Value Ref Range   Sodium 138 135 - 145 mmol/L   Potassium 4.5 3.5 - 5.1 mmol/L   Chloride 88 (L) 98 - 111 mmol/L   CO2 40 (H) 22 - 32 mmol/L   Glucose, Bld 176 (H) 70 - 99 mg/dL   BUN 41 (H) 8 - 23 mg/dL   Creatinine, Ser 1.59 (H) 0.44 - 1.00 mg/dL   Calcium 8.6 (L) 8.9 - 10.3 mg/dL   GFR calc non Af Amer 33 (L) >60 mL/min   GFR calc Af  Amer 39 (L) >60 mL/min    Comment: (NOTE) The eGFR has been calculated using the CKD EPI equation. This calculation has not been validated in all clinical situations. eGFR's persistently <60 mL/min signify possible Chronic Kidney Disease.    Anion gap 10 5 - 15    Comment: Performed at [facility], [address], [place], [place] [address]  Glucose, capillary     Status: Abnormal   Collection Time: 04/11/18  7:30 AM  Result Value Ref Range   Glucose-Capillary 155 (H) 70 - 99 mg/dL  Glucose, capillary     Status: Abnormal   Collection Time: 04/11/18 11:09 AM  Result Value Ref Range   Glucose-Capillary 222 (H) 70 - 99 mg/dL  CBC w/Diff     Status: Abnormal   Collection Time: 04/18/18 11:35 AM  Result Value Ref Range   WBC 9.5 4.0 - 10.5 K/uL   RBC 4.77 3.87 - 5.11 Mil/uL   Hemoglobin 11.8 (L) 12.0 - 15.0 g/dL   HCT 37.8 36.0 - 46.0 %   MCV 79.4 78.0 - 100.0 fl   MCHC 31.1 30.0 - 36.0 g/dL   RDW 18.9 (H) 11.5 - 15.5 %   Platelets 87.0 (L) 150.0 - 400.0 K/uL   Neutrophils Relative % (H) 43.0 - 77.0 %    90.2 Manual smear review agrees with instrument differential.   Lymphocytes Relative 4.9 (L) 12.0 - 46.0 %   Monocytes Relative 4.2 3.0 - 12.0 %   Eosinophils Relative 0.5 0.0 - 5.0 %   Basophils Relative 0.2 0.0 - 3.0 %   Neutro Abs 8.5 (H) 1.4 - 7.7 K/uL   Lymphs Abs 0.5 (L) 0.7 - 4.0 K/uL   Monocytes Absolute 0.4 0.1 - 1.0 K/uL   Eosinophils Absolute 0.0 0.0 - 0.7 K/uL   Basophils Absolute 0.0 0.0 -  0.1 K/uL  Comp Met (CMET)     Status: Abnormal   Collection Time: 04/18/18 11:35 AM  Result Value Ref Range   Sodium 138 135 - 145 mEq/L   Potassium 4.6 3.5 - 5.1 mEq/L   Chloride 91 (L) 96 - 112 mEq/L   CO2 42 (H) 19 - 32 mEq/L   Glucose, Bld 76 70 - 99 mg/dL   BUN 38 (H) 6 - 23 mg/dL   Creatinine, Ser 1.20 0.40 - 1.20 mg/dL   Total Bilirubin 0.6 0.2 - 1.2 mg/dL   Alkaline Phosphatase 49 39 - 117 U/L   AST 16 0 - 37 U/L   ALT 29 0 - 35 U/L   Total Protein 6.2 6.0 - 8.3  g/dL   Albumin 3.8 3.5 - 5.2 g/dL   Calcium 9.2 8.4 - 10.5 mg/dL   GFR 47.96 (L) >60.00 mL/min    Assessment/Plan: 1. Fall involving sidewalk curb, initial encounter No head trauma or LOC which is very fortunate. Mild soreness of L shoulder and hip. Exam reassuring. No indication for imaging at present time. Reviewed OTC medications for pain relief, as well as supportive measures.  2. Hematoma of skin Avoidance of "popping" reviewed with patient. Skin care discussed with patient in case this occurs. Will monitor resolution.   3. Diabetes mellitus, type II, insulin dependent (HCC) Needs refill of strips. This has been sent in. - glucose blood (GLUCOSE METER TEST) test strip; AccuCheck Inform 2 stips Check blood sugars twice daily Dx E11.9  Dispense: 100 each; Refill: 12  4. Need for immunization against influenza - Flu Vaccine QUAD 36+ mos IM   [name], PA-C

## 2018-05-02 NOTE — Telephone Encounter (Signed)
Noted. Patient has appt today.  

## 2018-05-02 NOTE — Telephone Encounter (Signed)
Copied from [place] [id number]. Topic: Quick Communication - See Telephone Encounter >> May 02, 2018  9:04 AM Vernona Rieger wrote: CRM for notification. See Telephone encounter for: 05/02/18.  Melissa with [facility] called and stated the patient reported to them yesterday that she had a fall at [facility] on Friday, December 13th. She does have a bruise on right hip and right elbow and a blood blister on lower left leg.

## 2018-05-02 NOTE — Patient Instructions (Addendum)
Keep sore areas iced and elevated. Can apply topical Icy Hot or Aspercreme to the area.  Tylenol if needed for pain. Symptoms should continue to improve.  Restart Chantix. This is the last script for this.   For the blister on the leg. Avoid popping this. It should be reabsorbed by the body. Keep skin clean and dry.  If you note it pops, that is ok. Keep clean and covered. Apply small amount of topical antibiotic 1-2 x day.

## 2018-05-03 ENCOUNTER — Ambulatory Visit (HOSPITAL_BASED_OUTPATIENT_CLINIC_OR_DEPARTMENT_OTHER)
Admission: RE | Admit: 2018-05-03 | Discharge: 2018-05-03 | Disposition: A | Discharge status: Home / Self Care | Payer: Medicare Other | Source: Ambulatory Visit | Attending: Adult Health | Admitting: Adult Health

## 2018-05-03 DIAGNOSIS — R911 Solitary pulmonary nodule: Secondary | ICD-10-CM | POA: Insufficient documentation

## 2018-05-03 DIAGNOSIS — J90 Pleural effusion, not elsewhere classified: Secondary | ICD-10-CM | POA: Diagnosis not present

## 2018-05-03 NOTE — Telephone Encounter (Signed)
Copied from [place] [id number]. Topic: Referral - Question >> May 03, 2018  9:11 AM Ivar Drape wrote: Reason for CRM: Carlette w/[facility] in [place] would like a call back from the provider's assist concerning the 04/28/18 referral

## 2018-05-04 ENCOUNTER — Ambulatory Visit: Payer: Medicare Other | Admitting: Internal Medicine

## 2018-05-04 ENCOUNTER — Encounter: Payer: Self-pay | Admitting: Internal Medicine

## 2018-05-04 VITALS — BP 132/60 | HR 93 | Ht 64.0 in | Wt 250.4 lb

## 2018-05-04 DIAGNOSIS — Z794 Long term (current) use of insulin: Secondary | ICD-10-CM | POA: Diagnosis not present

## 2018-05-04 DIAGNOSIS — I872 Venous insufficiency (chronic) (peripheral): Secondary | ICD-10-CM | POA: Diagnosis not present

## 2018-05-04 DIAGNOSIS — J9612 Chronic respiratory failure with hypercapnia: Secondary | ICD-10-CM | POA: Diagnosis not present

## 2018-05-04 DIAGNOSIS — I130 Hypertensive heart and chronic kidney disease with heart failure and stage 1 through stage 4 chronic kidney disease, or unspecified chronic kidney disease: Secondary | ICD-10-CM | POA: Diagnosis not present

## 2018-05-04 DIAGNOSIS — G4733 Obstructive sleep apnea (adult) (pediatric): Secondary | ICD-10-CM | POA: Diagnosis not present

## 2018-05-04 DIAGNOSIS — N183 Chronic kidney disease, stage 3 (moderate): Secondary | ICD-10-CM | POA: Diagnosis not present

## 2018-05-04 DIAGNOSIS — E1140 Type 2 diabetes mellitus with diabetic neuropathy, unspecified: Secondary | ICD-10-CM | POA: Diagnosis not present

## 2018-05-04 DIAGNOSIS — I2781 Cor pulmonale (chronic): Secondary | ICD-10-CM | POA: Diagnosis not present

## 2018-05-04 DIAGNOSIS — G894 Chronic pain syndrome: Secondary | ICD-10-CM | POA: Diagnosis not present

## 2018-05-04 DIAGNOSIS — Z9981 Dependence on supplemental oxygen: Secondary | ICD-10-CM | POA: Diagnosis not present

## 2018-05-04 DIAGNOSIS — J9611 Chronic respiratory failure with hypoxia: Secondary | ICD-10-CM

## 2018-05-04 DIAGNOSIS — J9621 Acute and chronic respiratory failure with hypoxia: Secondary | ICD-10-CM | POA: Diagnosis not present

## 2018-05-04 DIAGNOSIS — J449 Chronic obstructive pulmonary disease, unspecified: Secondary | ICD-10-CM | POA: Diagnosis not present

## 2018-05-04 DIAGNOSIS — K3184 Gastroparesis: Secondary | ICD-10-CM | POA: Diagnosis not present

## 2018-05-04 DIAGNOSIS — E1143 Type 2 diabetes mellitus with diabetic autonomic (poly)neuropathy: Secondary | ICD-10-CM | POA: Diagnosis not present

## 2018-05-04 DIAGNOSIS — I471 Supraventricular tachycardia: Secondary | ICD-10-CM | POA: Diagnosis not present

## 2018-05-04 DIAGNOSIS — Z7951 Long term (current) use of inhaled steroids: Secondary | ICD-10-CM | POA: Diagnosis not present

## 2018-05-04 DIAGNOSIS — M797 Fibromyalgia: Secondary | ICD-10-CM | POA: Diagnosis not present

## 2018-05-04 DIAGNOSIS — M1991 Primary osteoarthritis, unspecified site: Secondary | ICD-10-CM | POA: Diagnosis not present

## 2018-05-04 DIAGNOSIS — J439 Emphysema, unspecified: Secondary | ICD-10-CM | POA: Diagnosis not present

## 2018-05-04 DIAGNOSIS — J9622 Acute and chronic respiratory failure with hypercapnia: Secondary | ICD-10-CM | POA: Diagnosis not present

## 2018-05-04 DIAGNOSIS — G2581 Restless legs syndrome: Secondary | ICD-10-CM | POA: Diagnosis not present

## 2018-05-04 DIAGNOSIS — E1122 Type 2 diabetes mellitus with diabetic chronic kidney disease: Secondary | ICD-10-CM | POA: Diagnosis not present

## 2018-05-04 DIAGNOSIS — E1151 Type 2 diabetes mellitus with diabetic peripheral angiopathy without gangrene: Secondary | ICD-10-CM | POA: Diagnosis not present

## 2018-05-04 DIAGNOSIS — I5033 Acute on chronic diastolic (congestive) heart failure: Secondary | ICD-10-CM | POA: Diagnosis not present

## 2018-05-04 MED ORDER — BUDESONIDE-FORMOTEROL FUMARATE 160-4.5 MCG/ACT IN AERO: 2.0000 | INHALATION_SPRAY | Freq: Two times a day (BID) | RESPIRATORY_TRACT | 0 refills | Status: DC

## 2018-05-04 NOTE — Telephone Encounter (Signed)
She needs more structure than what they can offer her then. We will have her continue with home health for now.

## 2018-05-04 NOTE — Patient Instructions (Addendum)
Work on inhaler technique:  relax and gently blow all the way out then take a nice smooth deep breath back in, triggering the inhaler at same time you start breathing in.  Hold for up to 5 seconds if you can. Blow out thru nose. Rinse and gargle with water when done  You can use you your duoneb up to 4 x daily   Only use your albuterol as a rescue medication to be used if you can't catch your breath by resting or doing a relaxed purse lip breathing pattern.  - The less you use it, the better it will work when you need it. - Ok to use up to 2 puffs  every 4 hours if you must but call for immediate appointment if use goes up over your usual need - Don't leave home without it !!  (think of it like the spare tire for your car)    Please schedule a follow up visit in 6  months but call sooner if needed

## 2018-05-04 NOTE — Progress Notes (Signed)
Subjective:    Patient ID: Renee Noble, female    DOB: [birth year],    MRN: [id number]    Brief patient profile:  89 yowf quit smoking 07/2016   referred by Renee Noble to pulmonary clinic for copd evaluation in 12/2013  With GOLD III criteria and referred back  04/28/2017       History of Present Illness  04/28/2017  New pt ov/[name] re:re- establish for eval of sob/ ? Clear for surgery / maint rx = spiriva dpi Chief Complaint  Patient presents with  . Pulmonary Consult    Referred by Renee Noble, PA for eval of pulmonary hypertension. Pt states she also needs clearance for hernia repair surgery.  She c/o occ non prod cough. She states her breathing is doing well today. She rarely uses her rescue inhaler.    02 sat at rest RA = low 90s Also wearing 02 2.5 lpm hs Walking x 174ft on 02 = 2.5lpm may be 100 ft slow pace = [facility] = can't walk 100 yards even at a slow pace at a flat grade s stopping due to sob  / may be uses saba hfa once a month, almost never noct unless flaring with uri  Sleeping one- 2 pillows on cpap 2.5 lpm  Losing wt voluntarily hoping to have abd wall hernia surgery 2019  rec Start stiolto 2 pffs each am  Please see patient coordinator before you leave today  to schedule 2d echo and overnight 02 sat on cpap and your 02 2.5lpm  Please schedule a follow up office visit in 6 weeks, call sooner if needed  - add need to clarify who is following cpap  > PA doctor 10 y prior to OV  / 16 /2lpm    06/10/2017  f/u ov/Renee Noble re: back to spiriva /advair  2lpm 24/7 x does 3lp pulsed portable / better sobon stiolto but not covered  Chief Complaint  Patient presents with  . Follow-up    feels like her breathing,lungs are better,denies SOB,coughing,use O2 2.5L continuous O2  has not needed saba but doe still  = MMRC3 = can't walk 100 yards even at a slow pace at a flat grade s stopping due to sob   Sleeping ok on 2.5 lpm  rec Plan A = Automatic = Bevespi Take 2 puffs first thing  in am and then another 2 puffs about 12 hours later and if insurance doesn't cover or you don't like it go back to advair/spriva Work on inhaler technique:  Plan B = Backup Only use your albuterol as a rescue medication  Plan C = Crisis - only use your albuterol nebulizer if you first try Plan B and it fails to help > ok to use the nebulizer up to every 4 hours but if start needing it regularly call for immediate appointment Please schedule a follow up visit in 3 months but call sooner if needed to See [name] NP on return and bring your drug forumulary and all your inhalers and neb solutions        11/23/2017  f/u ov/Renee Noble re: COPD III  Chief Complaint  Patient presents with  . Follow-up    Pt states she has been doing okay since last visit. States she has been having some digestive problems that has been bothering her x2 weeks.  Dyspnea:  MMRC2 = can't walk a nl pace on a flat grade s sob but does fine slow and flat [employer] 2lpm sats staying above  90% Cough: min dry aggravated by hot weather / ac exposure also if too cool  SABA use: twice weekly never neb 02: sometimes take off 02 at rest sats are low 90s Plan A = Automatic = Try symbicort 160 Take 2 puffs first thing in am and then another 2 puffs about 12 hours later.  Work on inhaler technique:  relax and gently blow all the way out then take a nice smooth deep breath back in, triggering the inhaler at same time you start breathing in.  Hold for up to 5 seconds if you can. Blow out thru nose. Rinse and gargle with water when done Plan B = Backup Only use your albuterol as a rescue medication Plan C = Crisis - only use your albuterol nebulizer if you first try Plan B and it fails to help > ok to use the nebulizer up to every 4 hours but if start needing it regularly call for immediate appointment Keep up the good work on the weight loss - if needed increase 02 to keep above sats 90% with exercise - add: be sure she stops bevespi while  on symbicort but if prefers bevespi go back to it  - also I did not address her gi issues at ov but needs to contact her PCP or be referred to our GI dept - can try maalox plus prn in meantime      03/21/18 NP eval rec Continue on Symbicort 2 puffs Twice daily  , rinse after use.  Increase Duoneb Four times a day  .  Restart CPAP At bedtime  And with naps.  Continue on oxygen 4l/m . Goal is to keep O2 sats 88-92%.  Keep daily weights.  Lasix 20mg  daily for 2 days .  Low salt diet  Begin Prednisone 20mg  daily for 5 days , 10mg  daily for 5 days then stop .  Mucinex DM Twice daily  As needed  Cough/congestion .  CT scan in 6 weeks and As needed    Admit date: 04/07/2018 Discharge date: 04/11/2018   Discharge Diagnoses:  Acute on chronic respiratory failure with hypoxia and hypercarbia -Secondary to CHF exacerbation and COPD exacerbation -Presently stable on 5>>>3L -Patient is normally on 3 L nasal cannula at home -Pulmonary hygiene -Personally reviewed chest x-ray--vascular congestion/chronic interstitial changes;no consolidation  Acute on chronic diastolic CHF -16/11/3708 discharge weight 258.6 pounds-  -Admission weight 299 pounds -Continue IV furosemide>>>d/c home with torsemide 20 mg daily -renal function remained stable after transition to torsemide while in hospital -Echocardiogram--60-65%, no WMA, mild AS - sinus nonspecific T wave changes  COPD exacerbation -ContinuePulmicort -Conitnueduo nebs -ContinueIV Solu-Medrol>>d/c home with prednisone taper -Patient has over 80-pack-year history  SVT/MAT -HR intermittently into 120s -start diltiazem  CKD stage III -Baseline creatinine 1.4-1.5 -Monitor with diuresis -serum creatinine 1.59 on day of d/c    Diabetes mellitus type 2 with gastroparesis -Hemoglobin A1c--5.2 -increase lantus to 30 units -novolog 3 units with meals -NovoLog sliding scale -Anticipate elevated CBG secondary to  steroids -Continue metoclopramide  Chronic pain syndrome -Continue home dose MS Contin and oxycodone -The [place] Controlled Substance Reporting System has been queried for this patient--no red flags -Continue nortriptyline  Hyperlipidemia -Continue statin  Hypothyroidism -Continue Synthroid  Thrombocytopenia -This appears to be chronic upon review of medical records -Monitor for signs of bleeding -Serum G26--948 -Folic NIOE--7.0-->[id number]  Leg pain and edema -Venous duplex--neg  Morbid Obesity -BMI 50.67 -lifestyle modification         05/04/2018  Post hosp f/u ov/Ashlen Kiger re:  GOLD III spirometry/ hypercabia/02 dep 24/7 maint on symb 160 2bid / duone tid  Chief Complaint  Patient presents with  . Hospitalization Follow-up    Feels like she's back to her pre-hosp status post d/c Dec3 with CHF admission  Dyspnea:  .MMRC3 = can't walk 100 yards even at a slow pace at a flat grade s stopping due to sob   Cough: slt increase x 48 h/ mucinex helping Sleeping: 2 pillows / cpap/ 2.5 lpm  SABA use: neb tid and no hfa 02: 3lpm continuous at home/ pulsed when out     No obvious day to day or daytime variability or assoc excess/ purulent sputum or mucus plugs or hemoptysis or cp or chest tightness, subjective wheeze or overt sinus or hb symptoms.   Sleeping as above without nocturnal  or early am exacerbation  of respiratory  c/o's or need for noct saba. Also denies any obvious fluctuation of symptoms with weather or environmental changes or other aggravating or alleviating factors except as outlined above   No unusual exposure hx or h/o childhood pna/ asthma or knowledge of premature birth.  Current Allergies, Complete Past Medical History, Past Surgical History, Family History, and Social History were reviewed in [employer] record.  ROS  The following are not active complaints unless bolded Hoarseness, sore throat, dysphagia, dental  problems, itching, sneezing,  nasal congestion or discharge of excess mucus or purulent secretions, ear ache,   fever, chills, sweats, unintended wt loss or wt gain, classically pleuritic or exertional cp,  orthopnea pnd or arm/hand swelling  or leg swelling improving , presyncope, palpitations, abdominal pain, anorexia, nausea, vomiting, diarrhea  or change in bowel habits or change in bladder habits, change in stools or change in urine, dysuria, hematuria,  rash, arthralgias, visual complaints, headache, numbness, weakness or ataxia or problems with walking or coordination,  change in mood or  memory.        Current Meds  Medication Sig  . albuterol (PROAIR HFA) 108 (90 Base) MCG/ACT inhaler Inhale 1-2 puffs into the lungs every 6 (six) hours as needed for wheezing or shortness of breath.  . budesonide-formoterol (SYMBICORT) 160-4.5 MCG/ACT inhaler Inhale 2 puffs into the lungs 2 (two) times daily.  . clotrimazole-betamethasone (LOTRISONE) cream Apply 1 application topically 2 (two) times daily as needed.  . diltiazem (CARDIZEM CD) 180 MG 24 hr capsule Take 1 capsule (180 mg total) by mouth daily.  [name] Kitchen glucose blood (GLUCOSE METER TEST) test strip AccuCheck Inform 2 stips Check blood sugars twice daily Dx E11.9  . guaiFENesin (MUCINEX) 600 MG 12 hr tablet Take 1 tablet (600 mg total) by mouth 2 (two) times daily.  [name] Kitchen HUMALOG 100 UNIT/ML injection INJECT 0.15 ML (15 UNITS) INTO THE SKIN 3 TIMES DAILY AS NEEDED FOR HIGH BLOOD SUGAR (Patient taking differently: Inject 15 Units into the skin 3 (three) times daily as needed for high blood sugar. Uses sliding scale. Sugar over 200 will give insulin.)  . insulin glargine (LANTUS) 100 UNIT/ML injection INJECT 0.3MLS (30 UNITS TOTAL) INTO THE SKIN EVERY DAY (Patient taking differently: Inject 30 Units into the skin every morning. INJECT 0.3MLS (30 UNITS TOTAL) INTO THE SKIN EVERY DAY)  . ipratropium-albuterol (DUONEB) 0.5-2.5 (3) MG/3ML SOLN Take 3 mLs by  nebulization 4 (four) times daily. Dx: J44.9  . levothyroxine (SYNTHROID, LEVOTHROID) 88 MCG tablet Take 1 tablet (88 mcg total) by mouth daily before breakfast. (Patient taking differently: Take 88  mcg by mouth at bedtime. )  . metoCLOPramide (REGLAN) 10 MG tablet Take 1 tablet (10 mg total) by mouth every 6 (six) hours as needed for nausea. (Patient taking differently: Take 10 mg by mouth 2 (two) times daily. )  . morphine (MS CONTIN) 30 MG 12 hr tablet Take 30 mg by mouth every 12 (twelve) hours.  . Multiple Vitamins-Minerals (CENTRUM SILVER PO) Take by mouth.  . nortriptyline (PAMELOR) 25 MG capsule Take 1 capsule (25 mg total) by mouth at bedtime.  [name] Kitchen nystatin (MYCOSTATIN/NYSTOP) powder APPLY TOPICALLY TWICE DAILY AS NEEDED FOR YEAST INFECTION  . OXYGEN O2 3L at home.  . pravastatin (PRAVACHOL) 20 MG tablet TAKE ONE TABLET BY MOUTH EVERY DAY  . Probiotic Product (PROBIOTIC PO) Take by mouth.  Haig Prophet COMFORT INSULIN SYRINGE 30G X 1/2" 1 ML MISC USE FOUR (4) TIMES DAILY AS DIRECTED (Patient taking differently: Inject 1 Syringe into the skin 4 (four) times daily. )  . tiZANidine (ZANAFLEX) 2 MG tablet Take 2-4 mg by mouth every 12 (twelve) hours as needed for muscle spasms.  [name] Kitchen torsemide (DEMADEX) 20 MG tablet Take 1 tablet (20 mg total) by mouth daily.  . varenicline (CHANTIX PAK) 0.5 MG X 11 & 1 MG X 42 tablet Take one 0.5 mg tablet by mouth QD x 3 days, then increase to one 0.5 mg tablet BID x 4 days, then increase to one 1 mg tablet BID.  . [DISCONTINUED] budesonide-formoterol (SYMBICORT) 160-4.5 MCG/ACT inhaler Inhale 2 puffs into the lungs 2 (two) times daily.            Objective:   Physical Exam   amb obese wf nad   05/04/2018      250  06/10/2017       281   04/28/17 276 lb (125.2 kg)  04/23/17 279 lb (126.6 kg)  04/21/17 279 lb (126.6 kg)     Vital signs reviewed - Note on arrival 02 sats  92% on 3lpm pulsed tank        HEENT: Full dentures /   oropharynx clear. Nl external  ear canals without cough reflex -  Mild bilateral non-specific turbinate edema  - Modified Mallampati Score =   1    NECK :  without JVD/Nodes/TM/ nl carotid upstrokes bilaterally   LUNGS: no acc muscle use,  Mild barrel  contour chest wall with bilateral  Distant bs s audible wheeze and  without cough on insp or exp maneuver and mild  Hyperresonant  to  percussion bilaterally     CV:  RRR  no s3 or murmur or increase in P2, and with only 1+ pitting lower ext   ABD:  Obese/ soft/ limited excursion on inspiration.  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           I personally reviewed images and agree with radiology impression as follows:   Chest CT 05/03/18 1. Interval resolution of LEFT lobe pulmonary nodule. 2. Interval marked improvement in LEFT lower lobe pulmonary infection. Mild residual pleuroparenchymal thickening. 3. New RIGHT upper lobe pulmonary nodule. Recommend follow-up noncontrast CT in 3 to 6 months.        Assessment & Plan:

## 2018-05-04 NOTE — Telephone Encounter (Signed)
She is following up with Dr. Melvyn Novas today.   The Nurse Navigator states that she just wants some clarification of what you are wanting her to get from her visits with them. She states that this exercise program is not structured and that the patient would be going to like an open gym to do her rehab on her own.  They were not sure if that is what you were wanting.

## 2018-05-04 NOTE — Telephone Encounter (Signed)
Referral noted in the workque. Placed on 12/13 by [name] PA.  Review of medical history for appropriateness for pulmonary rehab maintenance program.  Unable to substantiate pt readiness for self pay, minimal supervision group exercise program. Appears pt is receiving home therapy through [facility].  Progress note from yesterday is incomplete. Message left for the office to please call back for clarification of pt needs. Pt sees Dr. Melvyn Novas but was a no show for her last scheduled appt.  Pt has appt scheduled for 12/19. Await return call. Maurice Small RN, BSN Cardiac and Pulmonary Rehab Nurse Navigator      Routing to PCP for him to read notes from Pulmonary Rehab -

## 2018-05-04 NOTE — Telephone Encounter (Signed)
She has significant pulmonary conditioning. If they are declining her or do not feel she is a good candidate, will have her continue follow-up with Dr. Melvyn Novas and discuss with him.

## 2018-05-05 ENCOUNTER — Other Ambulatory Visit: Payer: Self-pay | Admitting: Physician Assistant

## 2018-05-05 ENCOUNTER — Encounter: Payer: Self-pay | Admitting: Internal Medicine

## 2018-05-05 DIAGNOSIS — Z7951 Long term (current) use of inhaled steroids: Secondary | ICD-10-CM | POA: Diagnosis not present

## 2018-05-05 DIAGNOSIS — I5033 Acute on chronic diastolic (congestive) heart failure: Secondary | ICD-10-CM | POA: Diagnosis not present

## 2018-05-05 DIAGNOSIS — G894 Chronic pain syndrome: Secondary | ICD-10-CM | POA: Diagnosis not present

## 2018-05-05 DIAGNOSIS — Z794 Long term (current) use of insulin: Secondary | ICD-10-CM | POA: Diagnosis not present

## 2018-05-05 DIAGNOSIS — E1151 Type 2 diabetes mellitus with diabetic peripheral angiopathy without gangrene: Secondary | ICD-10-CM | POA: Diagnosis not present

## 2018-05-05 DIAGNOSIS — J449 Chronic obstructive pulmonary disease, unspecified: Secondary | ICD-10-CM | POA: Diagnosis not present

## 2018-05-05 DIAGNOSIS — E1143 Type 2 diabetes mellitus with diabetic autonomic (poly)neuropathy: Secondary | ICD-10-CM | POA: Diagnosis not present

## 2018-05-05 DIAGNOSIS — K3184 Gastroparesis: Secondary | ICD-10-CM | POA: Diagnosis not present

## 2018-05-05 DIAGNOSIS — G4733 Obstructive sleep apnea (adult) (pediatric): Secondary | ICD-10-CM | POA: Diagnosis not present

## 2018-05-05 DIAGNOSIS — I129 Hypertensive chronic kidney disease with stage 1 through stage 4 chronic kidney disease, or unspecified chronic kidney disease: Secondary | ICD-10-CM | POA: Diagnosis not present

## 2018-05-05 DIAGNOSIS — Z905 Acquired absence of kidney: Secondary | ICD-10-CM | POA: Diagnosis not present

## 2018-05-05 DIAGNOSIS — G2581 Restless legs syndrome: Secondary | ICD-10-CM | POA: Diagnosis not present

## 2018-05-05 DIAGNOSIS — Z9981 Dependence on supplemental oxygen: Secondary | ICD-10-CM | POA: Diagnosis not present

## 2018-05-05 DIAGNOSIS — I471 Supraventricular tachycardia: Secondary | ICD-10-CM | POA: Diagnosis not present

## 2018-05-05 DIAGNOSIS — J439 Emphysema, unspecified: Secondary | ICD-10-CM | POA: Diagnosis not present

## 2018-05-05 DIAGNOSIS — M1991 Primary osteoarthritis, unspecified site: Secondary | ICD-10-CM | POA: Diagnosis not present

## 2018-05-05 DIAGNOSIS — I2781 Cor pulmonale (chronic): Secondary | ICD-10-CM | POA: Diagnosis not present

## 2018-05-05 DIAGNOSIS — I130 Hypertensive heart and chronic kidney disease with heart failure and stage 1 through stage 4 chronic kidney disease, or unspecified chronic kidney disease: Secondary | ICD-10-CM | POA: Diagnosis not present

## 2018-05-05 DIAGNOSIS — E1122 Type 2 diabetes mellitus with diabetic chronic kidney disease: Secondary | ICD-10-CM | POA: Diagnosis not present

## 2018-05-05 DIAGNOSIS — E1140 Type 2 diabetes mellitus with diabetic neuropathy, unspecified: Secondary | ICD-10-CM | POA: Diagnosis not present

## 2018-05-05 DIAGNOSIS — M797 Fibromyalgia: Secondary | ICD-10-CM | POA: Diagnosis not present

## 2018-05-05 DIAGNOSIS — J9621 Acute and chronic respiratory failure with hypoxia: Secondary | ICD-10-CM | POA: Diagnosis not present

## 2018-05-05 DIAGNOSIS — E785 Hyperlipidemia, unspecified: Secondary | ICD-10-CM

## 2018-05-05 DIAGNOSIS — J9622 Acute and chronic respiratory failure with hypercapnia: Secondary | ICD-10-CM | POA: Diagnosis not present

## 2018-05-05 DIAGNOSIS — N183 Chronic kidney disease, stage 3 (moderate): Secondary | ICD-10-CM | POA: Diagnosis not present

## 2018-05-05 DIAGNOSIS — I872 Venous insufficiency (chronic) (peripheral): Secondary | ICD-10-CM | POA: Diagnosis not present

## 2018-05-05 NOTE — Assessment & Plan Note (Signed)
HC03   1/919  = 39  - ono on 2lpm ? On cpap or not done 06/28/17> desat  < 89% x 81.20m so needs cpap titration if no recent study   - Sleep titration done 08/12/17 > needs 3lpm blended in per Dr Bari Mantis interpretation > ordered  08/16/2017   - HC03  04/18/18  =  42    Adequate control on present rx, reviewed in detail with pt > no change in rx needed     I had an extended discussion with the patient reviewing all relevant studies completed to date and  lasting 15 to 20 minutes of a 25 minute post hosp office visit    See device teaching which extended face to face time for this visit.  Each maintenance medication was reviewed in detail including emphasizing most importantly the difference between maintenance and prns and under what circumstances the prns are to be triggered using an action plan format that is not reflected in the computer generated alphabetically organized AVS which I have not found useful in most complex patients, especially with respiratory illnesses  Please see AVS for specific instructions unique to this visit that I personally wrote and verbalized to the the pt in detail and then reviewed with pt  by my nurse highlighting any  changes in therapy recommended at today's visit to their plan of care.

## 2018-05-05 NOTE — Telephone Encounter (Signed)
Referral form has been received, filled out and faxed back.

## 2018-05-05 NOTE — Assessment & Plan Note (Signed)
Body mass index is 42.98 kg/m.  -  trending down, encouraged  Lab Results  Component Value Date   TSH 2.39 09/28/2016     Contributing to gerd risk/ doe/hypercarbia /osa >>> reviewed the need and the process to achieve and maintain neg calorie balance > defer f/u primary care including intermittently monitoring thyroid status

## 2018-05-05 NOTE — Telephone Encounter (Signed)
Spoke with Carlette again at Pulmonary Rehab.   The original order was for maintenance program which is not structured.   She is going to fax me a new referral form so that we can order the structured rehab for her that will file her insurance.

## 2018-05-05 NOTE — Assessment & Plan Note (Signed)
Trial off advair 12/25/13 as not using it consistently anyway> pt restarted around 02/03/14   - 02/14/2014 PFTs  FEV1  1.22 (48%) ratio 53 p 12% improvement from SABA and dlco 73% corrects to 84  - 02/15/2014  Walked RA  2 laps @ 185 ft each stopped due to  89% /   nl pace / legs gave out same time chest got "tight" relieved immediately at rest  - Spirometry 04/28/2017  FEV1 0.74 (30%)  Ratio 47 with classic curvature p spiriva dpi in am  - 04/28/2017    try stiolto 2 each am   improved but not on formulary  - 06/10/2017   try bevespi 2bid  - 11/23/2017  Wheezing on bevespi so changed to  symb 160 2bid  - 05/04/2018  After extensive coaching inhaler device,  effectiveness =    75%  >  continue symbicort    Clearly her recent admit was more related to diastolic dysfunction/ vol overload than to aecopd and doing much better at a drier wt so no need to change rx at present.  For convenience may want to change sama (duoneb) to [place] if insurance covers as did not do as well on lama/laba s the ics based on above records

## 2018-05-08 ENCOUNTER — Telehealth: Payer: Self-pay | Admitting: Physician Assistant

## 2018-05-08 ENCOUNTER — Encounter: Payer: Self-pay | Admitting: Adult Health

## 2018-05-08 ENCOUNTER — Telehealth (HOSPITAL_COMMUNITY): Payer: Self-pay

## 2018-05-08 ENCOUNTER — Other Ambulatory Visit: Payer: Self-pay | Admitting: Pulmonary Disease

## 2018-05-08 DIAGNOSIS — R918 Other nonspecific abnormal finding of lung field: Secondary | ICD-10-CM

## 2018-05-08 DIAGNOSIS — E1151 Type 2 diabetes mellitus with diabetic peripheral angiopathy without gangrene: Secondary | ICD-10-CM | POA: Diagnosis not present

## 2018-05-08 DIAGNOSIS — I130 Hypertensive heart and chronic kidney disease with heart failure and stage 1 through stage 4 chronic kidney disease, or unspecified chronic kidney disease: Secondary | ICD-10-CM | POA: Diagnosis not present

## 2018-05-08 DIAGNOSIS — G4733 Obstructive sleep apnea (adult) (pediatric): Secondary | ICD-10-CM | POA: Diagnosis not present

## 2018-05-08 DIAGNOSIS — Z7951 Long term (current) use of inhaled steroids: Secondary | ICD-10-CM | POA: Diagnosis not present

## 2018-05-08 DIAGNOSIS — J439 Emphysema, unspecified: Secondary | ICD-10-CM | POA: Diagnosis not present

## 2018-05-08 DIAGNOSIS — K3184 Gastroparesis: Secondary | ICD-10-CM | POA: Diagnosis not present

## 2018-05-08 DIAGNOSIS — E1140 Type 2 diabetes mellitus with diabetic neuropathy, unspecified: Secondary | ICD-10-CM | POA: Diagnosis not present

## 2018-05-08 DIAGNOSIS — J9622 Acute and chronic respiratory failure with hypercapnia: Secondary | ICD-10-CM | POA: Diagnosis not present

## 2018-05-08 DIAGNOSIS — M797 Fibromyalgia: Secondary | ICD-10-CM | POA: Diagnosis not present

## 2018-05-08 DIAGNOSIS — Z794 Long term (current) use of insulin: Secondary | ICD-10-CM | POA: Diagnosis not present

## 2018-05-08 DIAGNOSIS — N183 Chronic kidney disease, stage 3 (moderate): Secondary | ICD-10-CM | POA: Diagnosis not present

## 2018-05-08 DIAGNOSIS — R911 Solitary pulmonary nodule: Secondary | ICD-10-CM | POA: Insufficient documentation

## 2018-05-08 DIAGNOSIS — J9621 Acute and chronic respiratory failure with hypoxia: Secondary | ICD-10-CM | POA: Diagnosis not present

## 2018-05-08 DIAGNOSIS — I5033 Acute on chronic diastolic (congestive) heart failure: Secondary | ICD-10-CM | POA: Diagnosis not present

## 2018-05-08 DIAGNOSIS — I471 Supraventricular tachycardia: Secondary | ICD-10-CM | POA: Diagnosis not present

## 2018-05-08 DIAGNOSIS — G2581 Restless legs syndrome: Secondary | ICD-10-CM | POA: Diagnosis not present

## 2018-05-08 DIAGNOSIS — G894 Chronic pain syndrome: Secondary | ICD-10-CM | POA: Diagnosis not present

## 2018-05-08 DIAGNOSIS — E1122 Type 2 diabetes mellitus with diabetic chronic kidney disease: Secondary | ICD-10-CM | POA: Diagnosis not present

## 2018-05-08 DIAGNOSIS — M1991 Primary osteoarthritis, unspecified site: Secondary | ICD-10-CM | POA: Diagnosis not present

## 2018-05-08 DIAGNOSIS — I2781 Cor pulmonale (chronic): Secondary | ICD-10-CM | POA: Diagnosis not present

## 2018-05-08 DIAGNOSIS — E1143 Type 2 diabetes mellitus with diabetic autonomic (poly)neuropathy: Secondary | ICD-10-CM | POA: Diagnosis not present

## 2018-05-08 DIAGNOSIS — I872 Venous insufficiency (chronic) (peripheral): Secondary | ICD-10-CM | POA: Diagnosis not present

## 2018-05-08 DIAGNOSIS — Z9981 Dependence on supplemental oxygen: Secondary | ICD-10-CM | POA: Diagnosis not present

## 2018-05-08 NOTE — Telephone Encounter (Signed)
I have placed a HH Cert and plan of care in the bin upfront with a charge sheet.

## 2018-05-08 NOTE — Telephone Encounter (Signed)
Referral received from MD Tabori for Pulmonary Rehab with diagnosis of diastolic CHF and Chronic respiratory failure with hypoxia and hypercapnia. Clinical review of pt follow up appt on 05/04/18 Pulmonary office note.   Pt appropriate for scheduling for Pulmonary rehab.  Will forward to support staff for scheduling and verification of insurance eligibility/benefits with pt  consent. Joycelyn Man, RN, BSN Cardiac and Pulmonary Rehab Nurse

## 2018-05-08 NOTE — Telephone Encounter (Signed)
Forms have been placed in provider's box.

## 2018-05-09 DIAGNOSIS — J9621 Acute and chronic respiratory failure with hypoxia: Secondary | ICD-10-CM | POA: Diagnosis not present

## 2018-05-09 DIAGNOSIS — I130 Hypertensive heart and chronic kidney disease with heart failure and stage 1 through stage 4 chronic kidney disease, or unspecified chronic kidney disease: Secondary | ICD-10-CM | POA: Diagnosis not present

## 2018-05-09 DIAGNOSIS — I2781 Cor pulmonale (chronic): Secondary | ICD-10-CM | POA: Diagnosis not present

## 2018-05-09 DIAGNOSIS — G4733 Obstructive sleep apnea (adult) (pediatric): Secondary | ICD-10-CM | POA: Diagnosis not present

## 2018-05-09 DIAGNOSIS — Z794 Long term (current) use of insulin: Secondary | ICD-10-CM | POA: Diagnosis not present

## 2018-05-09 DIAGNOSIS — I872 Venous insufficiency (chronic) (peripheral): Secondary | ICD-10-CM | POA: Diagnosis not present

## 2018-05-09 DIAGNOSIS — I5033 Acute on chronic diastolic (congestive) heart failure: Secondary | ICD-10-CM | POA: Diagnosis not present

## 2018-05-09 DIAGNOSIS — M1991 Primary osteoarthritis, unspecified site: Secondary | ICD-10-CM | POA: Diagnosis not present

## 2018-05-09 DIAGNOSIS — E1140 Type 2 diabetes mellitus with diabetic neuropathy, unspecified: Secondary | ICD-10-CM | POA: Diagnosis not present

## 2018-05-09 DIAGNOSIS — I471 Supraventricular tachycardia: Secondary | ICD-10-CM | POA: Diagnosis not present

## 2018-05-09 DIAGNOSIS — Z9981 Dependence on supplemental oxygen: Secondary | ICD-10-CM | POA: Diagnosis not present

## 2018-05-09 DIAGNOSIS — E1143 Type 2 diabetes mellitus with diabetic autonomic (poly)neuropathy: Secondary | ICD-10-CM | POA: Diagnosis not present

## 2018-05-09 DIAGNOSIS — N183 Chronic kidney disease, stage 3 (moderate): Secondary | ICD-10-CM | POA: Diagnosis not present

## 2018-05-09 DIAGNOSIS — K3184 Gastroparesis: Secondary | ICD-10-CM | POA: Diagnosis not present

## 2018-05-09 DIAGNOSIS — G2581 Restless legs syndrome: Secondary | ICD-10-CM | POA: Diagnosis not present

## 2018-05-09 DIAGNOSIS — E1122 Type 2 diabetes mellitus with diabetic chronic kidney disease: Secondary | ICD-10-CM | POA: Diagnosis not present

## 2018-05-09 DIAGNOSIS — E1151 Type 2 diabetes mellitus with diabetic peripheral angiopathy without gangrene: Secondary | ICD-10-CM | POA: Diagnosis not present

## 2018-05-09 DIAGNOSIS — J439 Emphysema, unspecified: Secondary | ICD-10-CM | POA: Diagnosis not present

## 2018-05-09 DIAGNOSIS — G894 Chronic pain syndrome: Secondary | ICD-10-CM | POA: Diagnosis not present

## 2018-05-09 DIAGNOSIS — M797 Fibromyalgia: Secondary | ICD-10-CM | POA: Diagnosis not present

## 2018-05-09 DIAGNOSIS — Z7951 Long term (current) use of inhaled steroids: Secondary | ICD-10-CM | POA: Diagnosis not present

## 2018-05-09 DIAGNOSIS — J9622 Acute and chronic respiratory failure with hypercapnia: Secondary | ICD-10-CM | POA: Diagnosis not present

## 2018-05-09 NOTE — Telephone Encounter (Signed)
Reviewed. Requires supervising MD signature. Will place in her box for sig.

## 2018-05-11 DIAGNOSIS — Z9181 History of falling: Secondary | ICD-10-CM

## 2018-05-11 DIAGNOSIS — I2781 Cor pulmonale (chronic): Secondary | ICD-10-CM

## 2018-05-11 DIAGNOSIS — E1140 Type 2 diabetes mellitus with diabetic neuropathy, unspecified: Secondary | ICD-10-CM

## 2018-05-11 DIAGNOSIS — M1991 Primary osteoarthritis, unspecified site: Secondary | ICD-10-CM

## 2018-05-11 DIAGNOSIS — Z905 Acquired absence of kidney: Secondary | ICD-10-CM

## 2018-05-11 DIAGNOSIS — G2581 Restless legs syndrome: Secondary | ICD-10-CM

## 2018-05-11 DIAGNOSIS — Z9981 Dependence on supplemental oxygen: Secondary | ICD-10-CM | POA: Diagnosis not present

## 2018-05-11 DIAGNOSIS — M797 Fibromyalgia: Secondary | ICD-10-CM

## 2018-05-11 DIAGNOSIS — Z794 Long term (current) use of insulin: Secondary | ICD-10-CM

## 2018-05-11 DIAGNOSIS — J9621 Acute and chronic respiratory failure with hypoxia: Secondary | ICD-10-CM

## 2018-05-11 DIAGNOSIS — K3184 Gastroparesis: Secondary | ICD-10-CM

## 2018-05-11 DIAGNOSIS — Z87891 Personal history of nicotine dependence: Secondary | ICD-10-CM

## 2018-05-11 DIAGNOSIS — I872 Venous insufficiency (chronic) (peripheral): Secondary | ICD-10-CM | POA: Diagnosis not present

## 2018-05-11 DIAGNOSIS — I5033 Acute on chronic diastolic (congestive) heart failure: Secondary | ICD-10-CM | POA: Diagnosis not present

## 2018-05-11 DIAGNOSIS — N183 Chronic kidney disease, stage 3 (moderate): Secondary | ICD-10-CM | POA: Diagnosis not present

## 2018-05-11 DIAGNOSIS — J439 Emphysema, unspecified: Secondary | ICD-10-CM

## 2018-05-11 DIAGNOSIS — I471 Supraventricular tachycardia: Secondary | ICD-10-CM

## 2018-05-11 DIAGNOSIS — Z7951 Long term (current) use of inhaled steroids: Secondary | ICD-10-CM | POA: Diagnosis not present

## 2018-05-11 DIAGNOSIS — E1151 Type 2 diabetes mellitus with diabetic peripheral angiopathy without gangrene: Secondary | ICD-10-CM | POA: Diagnosis not present

## 2018-05-11 DIAGNOSIS — G894 Chronic pain syndrome: Secondary | ICD-10-CM | POA: Diagnosis not present

## 2018-05-11 DIAGNOSIS — I130 Hypertensive heart and chronic kidney disease with heart failure and stage 1 through stage 4 chronic kidney disease, or unspecified chronic kidney disease: Secondary | ICD-10-CM | POA: Diagnosis not present

## 2018-05-11 DIAGNOSIS — J9622 Acute and chronic respiratory failure with hypercapnia: Secondary | ICD-10-CM

## 2018-05-11 DIAGNOSIS — Z6841 Body Mass Index (BMI) 40.0 and over, adult: Secondary | ICD-10-CM

## 2018-05-11 DIAGNOSIS — Z79891 Long term (current) use of opiate analgesic: Secondary | ICD-10-CM

## 2018-05-11 DIAGNOSIS — E1143 Type 2 diabetes mellitus with diabetic autonomic (poly)neuropathy: Secondary | ICD-10-CM | POA: Diagnosis not present

## 2018-05-11 DIAGNOSIS — G4733 Obstructive sleep apnea (adult) (pediatric): Secondary | ICD-10-CM

## 2018-05-11 DIAGNOSIS — E1122 Type 2 diabetes mellitus with diabetic chronic kidney disease: Secondary | ICD-10-CM | POA: Diagnosis not present

## 2018-05-11 DIAGNOSIS — Z85528 Personal history of other malignant neoplasm of kidney: Secondary | ICD-10-CM

## 2018-05-11 NOTE — Telephone Encounter (Signed)
Form completed and placed in basket  

## 2018-05-11 NOTE — Telephone Encounter (Signed)
Received forms from back & faxed.

## 2018-05-11 NOTE — Telephone Encounter (Signed)
FYI

## 2018-05-12 DIAGNOSIS — I872 Venous insufficiency (chronic) (peripheral): Secondary | ICD-10-CM | POA: Diagnosis not present

## 2018-05-12 DIAGNOSIS — Z9981 Dependence on supplemental oxygen: Secondary | ICD-10-CM | POA: Diagnosis not present

## 2018-05-12 DIAGNOSIS — J9621 Acute and chronic respiratory failure with hypoxia: Secondary | ICD-10-CM | POA: Diagnosis not present

## 2018-05-12 DIAGNOSIS — M1991 Primary osteoarthritis, unspecified site: Secondary | ICD-10-CM | POA: Diagnosis not present

## 2018-05-12 DIAGNOSIS — J439 Emphysema, unspecified: Secondary | ICD-10-CM | POA: Diagnosis not present

## 2018-05-12 DIAGNOSIS — E1151 Type 2 diabetes mellitus with diabetic peripheral angiopathy without gangrene: Secondary | ICD-10-CM | POA: Diagnosis not present

## 2018-05-12 DIAGNOSIS — E1140 Type 2 diabetes mellitus with diabetic neuropathy, unspecified: Secondary | ICD-10-CM | POA: Diagnosis not present

## 2018-05-12 DIAGNOSIS — I5033 Acute on chronic diastolic (congestive) heart failure: Secondary | ICD-10-CM | POA: Diagnosis not present

## 2018-05-12 DIAGNOSIS — G894 Chronic pain syndrome: Secondary | ICD-10-CM | POA: Diagnosis not present

## 2018-05-12 DIAGNOSIS — M797 Fibromyalgia: Secondary | ICD-10-CM | POA: Diagnosis not present

## 2018-05-12 DIAGNOSIS — N183 Chronic kidney disease, stage 3 (moderate): Secondary | ICD-10-CM | POA: Diagnosis not present

## 2018-05-12 DIAGNOSIS — K3184 Gastroparesis: Secondary | ICD-10-CM | POA: Diagnosis not present

## 2018-05-12 DIAGNOSIS — G4733 Obstructive sleep apnea (adult) (pediatric): Secondary | ICD-10-CM | POA: Diagnosis not present

## 2018-05-12 DIAGNOSIS — I471 Supraventricular tachycardia: Secondary | ICD-10-CM | POA: Diagnosis not present

## 2018-05-12 DIAGNOSIS — I2781 Cor pulmonale (chronic): Secondary | ICD-10-CM | POA: Diagnosis not present

## 2018-05-12 DIAGNOSIS — Z794 Long term (current) use of insulin: Secondary | ICD-10-CM | POA: Diagnosis not present

## 2018-05-12 DIAGNOSIS — E1143 Type 2 diabetes mellitus with diabetic autonomic (poly)neuropathy: Secondary | ICD-10-CM | POA: Diagnosis not present

## 2018-05-12 DIAGNOSIS — Z7951 Long term (current) use of inhaled steroids: Secondary | ICD-10-CM | POA: Diagnosis not present

## 2018-05-12 DIAGNOSIS — I130 Hypertensive heart and chronic kidney disease with heart failure and stage 1 through stage 4 chronic kidney disease, or unspecified chronic kidney disease: Secondary | ICD-10-CM | POA: Diagnosis not present

## 2018-05-12 DIAGNOSIS — E1122 Type 2 diabetes mellitus with diabetic chronic kidney disease: Secondary | ICD-10-CM | POA: Diagnosis not present

## 2018-05-12 DIAGNOSIS — G2581 Restless legs syndrome: Secondary | ICD-10-CM | POA: Diagnosis not present

## 2018-05-12 DIAGNOSIS — J9622 Acute and chronic respiratory failure with hypercapnia: Secondary | ICD-10-CM | POA: Diagnosis not present

## 2018-05-15 DIAGNOSIS — I130 Hypertensive heart and chronic kidney disease with heart failure and stage 1 through stage 4 chronic kidney disease, or unspecified chronic kidney disease: Secondary | ICD-10-CM | POA: Diagnosis not present

## 2018-05-15 DIAGNOSIS — M797 Fibromyalgia: Secondary | ICD-10-CM | POA: Diagnosis not present

## 2018-05-15 DIAGNOSIS — I2781 Cor pulmonale (chronic): Secondary | ICD-10-CM | POA: Diagnosis not present

## 2018-05-15 DIAGNOSIS — G894 Chronic pain syndrome: Secondary | ICD-10-CM | POA: Diagnosis not present

## 2018-05-15 DIAGNOSIS — I471 Supraventricular tachycardia: Secondary | ICD-10-CM | POA: Diagnosis not present

## 2018-05-15 DIAGNOSIS — E1122 Type 2 diabetes mellitus with diabetic chronic kidney disease: Secondary | ICD-10-CM | POA: Diagnosis not present

## 2018-05-15 DIAGNOSIS — Z794 Long term (current) use of insulin: Secondary | ICD-10-CM | POA: Diagnosis not present

## 2018-05-15 DIAGNOSIS — Z9981 Dependence on supplemental oxygen: Secondary | ICD-10-CM | POA: Diagnosis not present

## 2018-05-15 DIAGNOSIS — G2581 Restless legs syndrome: Secondary | ICD-10-CM | POA: Diagnosis not present

## 2018-05-15 DIAGNOSIS — M1991 Primary osteoarthritis, unspecified site: Secondary | ICD-10-CM | POA: Diagnosis not present

## 2018-05-15 DIAGNOSIS — J9622 Acute and chronic respiratory failure with hypercapnia: Secondary | ICD-10-CM | POA: Diagnosis not present

## 2018-05-15 DIAGNOSIS — J439 Emphysema, unspecified: Secondary | ICD-10-CM | POA: Diagnosis not present

## 2018-05-15 DIAGNOSIS — I5033 Acute on chronic diastolic (congestive) heart failure: Secondary | ICD-10-CM | POA: Diagnosis not present

## 2018-05-15 DIAGNOSIS — E1143 Type 2 diabetes mellitus with diabetic autonomic (poly)neuropathy: Secondary | ICD-10-CM | POA: Diagnosis not present

## 2018-05-15 DIAGNOSIS — J9621 Acute and chronic respiratory failure with hypoxia: Secondary | ICD-10-CM | POA: Diagnosis not present

## 2018-05-15 DIAGNOSIS — K3184 Gastroparesis: Secondary | ICD-10-CM | POA: Diagnosis not present

## 2018-05-15 DIAGNOSIS — E1151 Type 2 diabetes mellitus with diabetic peripheral angiopathy without gangrene: Secondary | ICD-10-CM | POA: Diagnosis not present

## 2018-05-15 DIAGNOSIS — N183 Chronic kidney disease, stage 3 (moderate): Secondary | ICD-10-CM | POA: Diagnosis not present

## 2018-05-15 DIAGNOSIS — E1140 Type 2 diabetes mellitus with diabetic neuropathy, unspecified: Secondary | ICD-10-CM | POA: Diagnosis not present

## 2018-05-15 DIAGNOSIS — G4733 Obstructive sleep apnea (adult) (pediatric): Secondary | ICD-10-CM | POA: Diagnosis not present

## 2018-05-15 DIAGNOSIS — Z7951 Long term (current) use of inhaled steroids: Secondary | ICD-10-CM | POA: Diagnosis not present

## 2018-05-15 DIAGNOSIS — I872 Venous insufficiency (chronic) (peripheral): Secondary | ICD-10-CM | POA: Diagnosis not present

## 2018-05-16 ENCOUNTER — Ambulatory Visit (INDEPENDENT_AMBULATORY_CARE_PROVIDER_SITE_OTHER): Payer: Medicare Other | Admitting: Physician Assistant

## 2018-05-16 ENCOUNTER — Encounter: Payer: Self-pay | Admitting: Physician Assistant

## 2018-05-16 ENCOUNTER — Other Ambulatory Visit: Payer: Self-pay

## 2018-05-16 VITALS — BP 128/78 | HR 100 | Temp 97.7°F | Resp 16 | Ht 64.0 in | Wt 255.0 lb

## 2018-05-16 DIAGNOSIS — B9689 Other specified bacterial agents as the cause of diseases classified elsewhere: Secondary | ICD-10-CM

## 2018-05-16 DIAGNOSIS — J208 Acute bronchitis due to other specified organisms: Secondary | ICD-10-CM

## 2018-05-16 DIAGNOSIS — J9611 Chronic respiratory failure with hypoxia: Secondary | ICD-10-CM | POA: Diagnosis not present

## 2018-05-16 DIAGNOSIS — J441 Chronic obstructive pulmonary disease with (acute) exacerbation: Secondary | ICD-10-CM | POA: Diagnosis not present

## 2018-05-16 MED ORDER — BENZONATATE 200 MG PO CAPS: 200.0000 mg | ORAL_CAPSULE | Freq: Three times a day (TID) | ORAL | 0 refills | Status: DC | PRN

## 2018-05-16 MED ORDER — PREDNISONE 20 MG PO TABS: 40.0000 mg | ORAL_TABLET | Freq: Every day | ORAL | 0 refills | Status: DC

## 2018-05-16 MED ORDER — DOXYCYCLINE HYCLATE 100 MG PO CAPS: 100.0000 mg | ORAL_CAPSULE | Freq: Two times a day (BID) | ORAL | 0 refills | Status: DC

## 2018-05-16 NOTE — Patient Instructions (Signed)
Please start the steroid burst as directed. Start antibiotic as directed. Keep up with chronic medications, including the mucinex twice daily.  I want you to follow-up with one of my colleagues on Friday for reassessment before the weekend.  ER for any worsening symptoms.

## 2018-05-16 NOTE — Progress Notes (Signed)
Patient presents to clinic today c/o > 1 week of cough with chest congestion, now productive of white sputum. Denies fever, nasal congestion, sinus congestion. Notes increase in wheezing and her shortness of breath with exertion. Was at PT yesterday and O2 dropped down to 88 so her O2 was increased to 4L per minute while exerting herself. Is abl to keep oxygent stable on 3L at rest. Is taking all of her chronic mediations as directed. Is using Mucinex twice daily.  Past Medical History:  Diagnosis Date  . Abdominal mass of other site   . Cervical compression fracture ([place])   . CHF (congestive heart failure) ([place])   . Chronic kidney disease, stage 3 (HCC)    Borderline Stage 2-3  . Chronic lower limb pain   . COPD (chronic obstructive pulmonary disease) ([place])   . CTS (carpal tunnel syndrome)   . Depression   . Diabetes ([place])    Type II  . Fibromyalgia   . Hepatitis C    cured last year (2018)  . Hypercholesteremia   . Hypertension   . Hypothyroidism   . Morbid obesity ([place])   . Neuropathy    Diabetes  . OSA (obstructive sleep apnea)   . Osteoarthritis   . Renal cancer ([place])    Left Kidney Removed  . RLS (restless legs syndrome)   . Syncope   . Venous stasis     Current Outpatient Medications on File Prior to Visit  Medication Sig Dispense Refill  . albuterol (PROAIR HFA) 108 (90 Base) MCG/ACT inhaler Inhale 1-2 puffs into the lungs every 6 (six) hours as needed for wheezing or shortness of breath. 1 Inhaler 5  . budesonide-formoterol (SYMBICORT) 160-4.5 MCG/ACT inhaler Inhale 2 puffs into the lungs 2 (two) times daily. 1 Inhaler 0  . clotrimazole-betamethasone (LOTRISONE) cream Apply 1 application topically 2 (two) times daily as needed. 45 g 0  . diltiazem (CARDIZEM CD) 180 MG 24 hr capsule Take 1 capsule (180 mg total) by mouth daily. 30 capsule 1  . glucose blood (GLUCOSE METER TEST) test strip AccuCheck Inform 2 stips Check blood sugars twice daily Dx E11.9 100 each 12    . guaiFENesin (MUCINEX) 600 MG 12 hr tablet Take 1 tablet (600 mg total) by mouth 2 (two) times daily. 20 tablet 0  . HUMALOG 100 UNIT/ML injection INJECT 0.15 ML (15 UNITS) INTO THE SKIN 3 TIMES DAILY AS NEEDED FOR HIGH BLOOD SUGAR (Patient taking differently: Inject 15 Units into the skin 3 (three) times daily as needed for high blood sugar. Uses sliding scale. Sugar over 200 will give insulin.) 10 mL 0  . insulin glargine (LANTUS) 100 UNIT/ML injection INJECT 0.3MLS (30 UNITS TOTAL) INTO THE SKIN EVERY DAY (Patient taking differently: Inject 30 Units into the skin every morning. INJECT 0.3MLS (30 UNITS TOTAL) INTO THE SKIN EVERY DAY) 30 mL 3  . ipratropium-albuterol (DUONEB) 0.5-2.5 (3) MG/3ML SOLN Take 3 mLs by nebulization 4 (four) times daily. Dx: J44.9 360 mL 5  . levothyroxine (SYNTHROID, LEVOTHROID) 88 MCG tablet Take 1 tablet (88 mcg total) by mouth daily before breakfast. (Patient taking differently: Take 88 mcg by mouth at bedtime. ) 90 tablet 1  . losartan (COZAAR) 25 MG tablet Take 25 mg by mouth daily.    . metoCLOPramide (REGLAN) 10 MG tablet Take 1 tablet (10 mg total) by mouth every 6 (six) hours as needed for nausea. (Patient taking differently: Take 10 mg by mouth 2 (two) times daily. ) 120  tablet 1  . morphine (MS CONTIN) 30 MG 12 hr tablet Take 30 mg by mouth every 12 (twelve) hours.  0  . Multiple Vitamins-Minerals (CENTRUM SILVER PO) Take by mouth.    . nortriptyline (PAMELOR) 25 MG capsule Take 1 capsule (25 mg total) by mouth at bedtime. 90 capsule 1  . nystatin (MYCOSTATIN/NYSTOP) powder APPLY TOPICALLY TWICE DAILY AS NEEDED FOR YEAST INFECTION 15 g 2  . OXYGEN O2 3L at home.    . pravastatin (PRAVACHOL) 20 MG tablet TAKE ONE TABLET BY MOUTH EVERY DAY 90 tablet 0  . Probiotic Product (PROBIOTIC PO) Take by mouth.    Haig Prophet COMFORT INSULIN SYRINGE 30G X 1/2" 1 ML MISC USE FOUR (4) TIMES DAILY AS DIRECTED (Patient taking differently: Inject 1 Syringe into the skin 4 (four)  times daily. ) 100 each 3  . tiZANidine (ZANAFLEX) 2 MG tablet Take 2-4 mg by mouth every 12 (twelve) hours as needed for muscle spasms.    [name] Kitchen torsemide (DEMADEX) 20 MG tablet Take 1 tablet (20 mg total) by mouth daily. 30 tablet 1  . varenicline (CHANTIX PAK) 0.5 MG X 11 & 1 MG X 42 tablet Take one 0.5 mg tablet by mouth QD x 3 days, then increase to one 0.5 mg tablet BID x 4 days, then increase to one 1 mg tablet BID. 53 tablet 0   No current facility-administered medications on file prior to visit.     Allergies  Allergen Reactions  . Gabapentin Anaphylaxis  . Lyrica [Pregabalin] Shortness Of Breath    Trouble breathing  . Ketorolac Tromethamine Hives  . Lisinopril Cough   Review of Systems - See HPI.  All other ROS are negative.  BP 128/78   Pulse 100   Temp 97.7 F (36.5 C) (Oral)   Resp 16   Ht 5' 4" (1.626 m)   Wt 255 lb (115.7 kg)   SpO2 98%   BMI 43.77 kg/m   Physical Exam Vitals signs reviewed.  Constitutional:      Appearance: Normal appearance.  HENT:     Head: Normocephalic and atraumatic.     Right Ear: Tympanic membrane normal.     Left Ear: Tympanic membrane normal.     Nose: Nose normal.     Mouth/Throat:     Mouth: Mucous membranes are moist.  Eyes:     Conjunctiva/sclera: Conjunctivae normal.  Neck:     Musculoskeletal: Neck supple.  Cardiovascular:     Rate and Rhythm: Tachycardia present.     Pulses: Normal pulses.     Heart sounds: Normal heart sounds.  Pulmonary:     Breath sounds: No stridor. Wheezing present. No rhonchi or rales.  Chest:     Chest wall: Tenderness present.  Neurological:     General: No focal deficit present.     Mental Status: She is alert and oriented to person, place, and time.  Psychiatric:        Mood and Affect: Mood normal.    Recent Results (from the past 2160 hour(s))  Basic metabolic panel     Status: Abnormal   Collection Time: 02/24/18  5:28 PM  Result Value Ref Range   Sodium 146 (H) 135 - 145 mmol/L     Potassium 4.0 3.5 - 5.1 mmol/L   Chloride 98 98 - 111 mmol/L   CO2 37 (H) 22 - 32 mmol/L   Glucose, Bld 62 (L) 70 - 99 mg/dL   BUN 25 (H) 8 -  23 mg/dL   Creatinine, Ser 1.48 (H) 0.44 - 1.00 mg/dL   Calcium 9.2 8.9 - 10.3 mg/dL   GFR calc non Af Amer 36 (L) >60 mL/min   GFR calc Af Amer 42 (L) >60 mL/min    Comment: (NOTE) The eGFR has been calculated using the CKD EPI equation. This calculation has not been validated in all clinical situations. eGFR's persistently <60 mL/min signify possible Chronic Kidney Disease.    Anion gap 11 5 - 15    Comment: Performed at [facility], [place] [address], [place], [place] [address]  CBC with Differential/Platelet     Status: Abnormal   Collection Time: 02/24/18  5:28 PM  Result Value Ref Range   WBC 6.1 4.0 - 10.5 K/uL   RBC 3.82 (L) 3.87 - 5.11 MIL/uL   Hemoglobin 9.6 (L) 12.0 - 15.0 g/dL   HCT 34.8 (L) 36.0 - 46.0 %   MCV 91.1 80.0 - 100.0 fL   MCH 25.1 (L) 26.0 - 34.0 pg   MCHC 27.6 (L) 30.0 - 36.0 g/dL   RDW 17.1 (H) 11.5 - 15.5 %   Platelets 85 (L) 150 - 400 K/uL    Comment: REPEATED TO VERIFY PLATELET COUNT CONFIRMED BY SMEAR SPECIMEN CHECKED FOR CLOTS Immature Platelet Fraction may be clinically indicated, consider ordering this additional test [id number]    nRBC 0.0 0.0 - 0.2 %   Neutrophils Relative % 78 %   Neutro Abs 4.8 1.7 - 7.7 K/uL   Lymphocytes Relative 12 %   Lymphs Abs 0.7 0.7 - 4.0 K/uL   Monocytes Relative 8 %   Monocytes Absolute 0.5 0.1 - 1.0 K/uL   Eosinophils Relative 1 %   Eosinophils Absolute 0.1 0.0 - 0.5 K/uL   Basophils Relative 0 %   Basophils Absolute 0.0 0.0 - 0.1 K/uL   Immature Granulocytes 1 %   Abs Immature Granulocytes 0.06 0.00 - 0.07 K/uL    Comment: Performed at [facility], [place] [address], [place], [place] [address]  Type and screen [place]     Status: None   Collection Time: 02/24/18 10:31 PM  Result Value Ref Range    ABO/RH(D) A POS    Antibody Screen NEG    Sample Expiration      02/27/2018 Performed at [facility], [place] [address], [place], [place] [address]   ABO/Rh     Status: None   Collection Time: 02/24/18 10:31 PM  Result Value Ref Range   ABO/RH(D)      A POS Performed at [facility], [place] [address] [place], [place] [address]   Vitamin B12     Status: None   Collection Time: 02/24/18 10:38 PM  Result Value Ref Range   Vitamin B-12 444 180 - 914 pg/mL    Comment: (NOTE) This assay is not validated for testing neonatal or myeloproliferative syndrome specimens for Vitamin B12 levels. Performed at [facility], [place] [address], [place], [place] [address]   Folate     Status: None   Collection Time: 02/24/18 10:38 PM  Result Value Ref Range   Folate 10.4 >5.9 ng/mL    Comment: Performed at [facility] [place] [address] [place], [place] [address]  Iron and TIBC     Status: Abnormal   Collection Time: 02/24/18 10:38 PM  Result Value Ref Range   Iron 20 (L) 28 - 170 ug/dL   TIBC 240 (  L) 250 - 450 ug/dL   Saturation Ratios 8 (L) 10.4 - 31.8 %   UIBC 220 ug/dL    Comment: Performed at [facility], [place] [address] [place], [place] [address]  Ferritin     Status: None   Collection Time: 02/24/18 10:38 PM  Result Value Ref Range   Ferritin 95 11 - 307 ng/mL    Comment: Performed at [facility], [place] [address], [place], [place] [address]  Reticulocytes     Status: Abnormal   Collection Time: 02/24/18 10:38 PM  Result Value Ref Range   Retic Ct Pct 1.7 0.4 - 3.1 %   RBC. 4.17 3.87 - 5.11 MIL/uL   Retic Count, Absolute 69.6 19.0 - 186.0 K/uL   Immature Retic Fract 21.9 (H) 2.3 - 15.9 %    Comment: Performed at [facility], [place] [address], [place], [place] [address]  Procalcitonin - Baseline     Status: None   Collection Time: 02/24/18 10:38 PM  Result  Value Ref Range   Procalcitonin <0.10 ng/mL    Comment:        Interpretation: PCT (Procalcitonin) <= 0.5 ng/mL: Systemic infection (sepsis) is not likely. Local bacterial infection is possible. (NOTE)       Sepsis PCT Algorithm           Lower Respiratory Tract                                      Infection PCT Algorithm    ----------------------------     ----------------------------         PCT < 0.25 ng/mL                PCT < 0.10 ng/mL         Strongly encourage             Strongly discourage   discontinuation of antibiotics    initiation of antibiotics    ----------------------------     -----------------------------       PCT 0.25 - 0.50 ng/mL            PCT 0.10 - 0.25 ng/mL               OR       >80% decrease in PCT            Discourage initiation of                                            antibiotics      Encourage discontinuation           of antibiotics    ----------------------------     -----------------------------         PCT >= 0.50 ng/mL              PCT 0.26 - 0.50 ng/mL               AND        <80% decrease in PCT             Encourage initiation of  antibiotics       Encourage continuation           of antibiotics    ----------------------------     -----------------------------        PCT >= 0.50 ng/mL                  PCT > 0.50 ng/mL               AND         increase in PCT                  Strongly encourage                                      initiation of antibiotics    Strongly encourage escalation           of antibiotics                                     -----------------------------                                           PCT <= 0.25 ng/mL                                                 OR                                        > 80% decrease in PCT                                     Discontinue / Do not initiate                                             antibiotics Performed at [place] [address], [place], [place] [address]   Glucose, capillary     Status: Abnormal   Collection Time: 02/25/18 12:12 AM  Result Value Ref Range   Glucose-Capillary 222 (H) 70 - 99 mg/dL   Comment 1 Notify RN   HIV antibody (Routine Testing)     Status: None   Collection Time: 02/25/18  5:48 AM  Result Value Ref Range   HIV Screen 4th Generation wRfx Non Reactive Non Reactive    Comment: (NOTE) Performed At: [facility] [place], [place] [id number] Rush Farmer MD [id number]   Basic metabolic panel     Status: Abnormal   Collection Time: 02/25/18  5:48 AM  Result Value Ref Range   Sodium 141 135 - 145 mmol/L   Potassium 5.4 (H) 3.5 - 5.1 mmol/L    Comment: DELTA CHECK NOTED SLIGHT HEMOLYSIS    Chloride 97 (L) 98 - 111 mmol/L   CO2 37 (H) 22 -  32 mmol/L   Glucose, Bld 201 (H) 70 - 99 mg/dL   BUN 29 (H) 8 - 23 mg/dL   Creatinine, Ser 1.50 (H) 0.44 - 1.00 mg/dL   Calcium 8.6 (L) 8.9 - 10.3 mg/dL   GFR calc non Af Amer 36 (L) >60 mL/min   GFR calc Af Amer 41 (L) >60 mL/min    Comment: (NOTE) The eGFR has been calculated using the CKD EPI equation. This calculation has not been validated in all clinical situations. eGFR's persistently <60 mL/min signify possible Chronic Kidney Disease.    Anion gap 7 5 - 15    Comment: Performed at [facility], [place] [address] [place], [place] [address]  CBC WITH DIFFERENTIAL     Status: Abnormal   Collection Time: 02/25/18  5:48 AM  Result Value Ref Range   WBC 3.9 (L) 4.0 - 10.5 K/uL   RBC 3.77 (L) 3.87 - 5.11 MIL/uL   Hemoglobin 9.6 (L) 12.0 - 15.0 g/dL   HCT 34.4 (L) 36.0 - 46.0 %   MCV 91.2 80.0 - 100.0 fL   MCH 25.5 (L) 26.0 - 34.0 pg   MCHC 27.9 (L) 30.0 - 36.0 g/dL   RDW 16.9 (H) 11.5 - 15.5 %   Platelets 69 (L) 150 - 400 K/uL    Comment: REPEATED TO VERIFY Immature Platelet Fraction may be clinically indicated, consider ordering this additional  test [id number] CONSISTENT WITH PREVIOUS RESULT    nRBC 0.0 0.0 - 0.2 %   Neutrophils Relative % 91 %   Neutro Abs 3.6 1.7 - 7.7 K/uL   Lymphocytes Relative 5 %   Lymphs Abs 0.2 (L) 0.7 - 4.0 K/uL   Monocytes Relative 2 %   Monocytes Absolute 0.1 0.1 - 1.0 K/uL   Eosinophils Relative 0 %   Eosinophils Absolute 0.0 0.0 - 0.5 K/uL   Basophils Relative 0 %   Basophils Absolute 0.0 0.0 - 0.1 K/uL   Immature Granulocytes 2 %   Abs Immature Granulocytes 0.08 (H) 0.00 - 0.07 K/uL    Comment: Performed at [facility], [place] [address], [place], [place] [address]  Glucose, capillary     Status: Abnormal   Collection Time: 02/25/18  7:42 AM  Result Value Ref Range   Glucose-Capillary 173 (H) 70 - 99 mg/dL  Glucose, capillary     Status: Abnormal   Collection Time: 02/25/18 12:08 PM  Result Value Ref Range   Glucose-Capillary 275 (H) 70 - 99 mg/dL  Glucose, capillary     Status: Abnormal   Collection Time: 02/25/18  4:48 PM  Result Value Ref Range   Glucose-Capillary 220 (H) 70 - 99 mg/dL  Glucose, capillary     Status: Abnormal   Collection Time: 02/25/18  8:55 PM  Result Value Ref Range   Glucose-Capillary 235 (H) 70 - 99 mg/dL   Comment 1 Notify RN   CBC with Differential/Platelet     Status: Abnormal   Collection Time: 02/26/18  4:53 AM  Result Value Ref Range   WBC 7.6 4.0 - 10.5 K/uL   RBC 3.93 3.87 - 5.11 MIL/uL   Hemoglobin 9.9 (L) 12.0 - 15.0 g/dL   HCT 36.3 36.0 - 46.0 %   MCV 92.4 80.0 - 100.0 fL   MCH 25.2 (L) 26.0 - 34.0 pg   MCHC 27.3 (L) 30.0 - 36.0 g/dL   RDW 16.6 (H) 11.5 - 15.5 %   Platelets 82 (L) 150 - 400 K/uL  Comment: Immature Platelet Fraction may be clinically indicated, consider ordering this additional test [id number] CONSISTENT WITH PREVIOUS RESULT    nRBC 0.0 0.0 - 0.2 %   Neutrophils Relative % 92 %   Neutro Abs 7.0 1.7 - 7.7 K/uL   Lymphocytes Relative 4 %   Lymphs Abs 0.3 (L) 0.7 - 4.0 K/uL   Monocytes Relative 3 %    Monocytes Absolute 0.3 0.1 - 1.0 K/uL   Eosinophils Relative 0 %   Eosinophils Absolute 0.0 0.0 - 0.5 K/uL   Basophils Relative 0 %   Basophils Absolute 0.0 0.0 - 0.1 K/uL   Immature Granulocytes 1 %   Abs Immature Granulocytes 0.09 (H) 0.00 - 0.07 K/uL    Comment: Performed at [facility], [place] [address] [place], [place] [address]  Renal function panel     Status: Abnormal   Collection Time: 02/26/18  4:53 AM  Result Value Ref Range   Sodium 140 135 - 145 mmol/L   Potassium 5.6 (H) 3.5 - 5.1 mmol/L   Chloride 95 (L) 98 - 111 mmol/L   CO2 35 (H) 22 - 32 mmol/L   Glucose, Bld 191 (H) 70 - 99 mg/dL   BUN 31 (H) 8 - 23 mg/dL   Creatinine, Ser 1.42 (H) 0.44 - 1.00 mg/dL   Calcium 9.1 8.9 - 10.3 mg/dL   Phosphorus 3.5 2.5 - 4.6 mg/dL   Albumin 3.3 (L) 3.5 - 5.0 g/dL   GFR calc non Af Amer 38 (L) >60 mL/min   GFR calc Af Amer 44 (L) >60 mL/min    Comment: (NOTE) The eGFR has been calculated using the CKD EPI equation. This calculation has not been validated in all clinical situations. eGFR's persistently <60 mL/min signify possible Chronic Kidney Disease.    Anion gap 10 5 - 15    Comment: Performed at [facility], [place] [address] [place], [place] [address]  Glucose, capillary     Status: Abnormal   Collection Time: 02/26/18  7:34 AM  Result Value Ref Range   Glucose-Capillary 192 (H) 70 - 99 mg/dL  Glucose, capillary     Status: Abnormal   Collection Time: 02/26/18 11:50 AM  Result Value Ref Range   Glucose-Capillary 211 (H) 70 - 99 mg/dL  Glucose, capillary     Status: Abnormal   Collection Time: 02/26/18  4:49 PM  Result Value Ref Range   Glucose-Capillary 229 (H) 70 - 99 mg/dL  Glucose, capillary     Status: Abnormal   Collection Time: 02/26/18  9:00 PM  Result Value Ref Range   Glucose-Capillary 194 (H) 70 - 99 mg/dL  Basic metabolic panel     Status: Abnormal   Collection Time: 02/27/18  5:15 AM  Result Value Ref Range    Sodium 136 135 - 145 mmol/L   Potassium 5.0 3.5 - 5.1 mmol/L   Chloride 88 (L) 98 - 111 mmol/L   CO2 38 (H) 22 - 32 mmol/L   Glucose, Bld 125 (H) 70 - 99 mg/dL   BUN 45 (H) 8 - 23 mg/dL   Creatinine, Ser 1.57 (H) 0.44 - 1.00 mg/dL   Calcium 9.0 8.9 - 10.3 mg/dL   GFR calc non Af Amer 34 (L) >60 mL/min   GFR calc Af Amer 39 (L) >60 mL/min    Comment: (NOTE) The eGFR has been calculated using the CKD EPI equation. This calculation has not been validated in all clinical situations. eGFR's persistently <60 mL/min  signify possible Chronic Kidney Disease.    Anion gap 10 5 - 15    Comment: Performed at [facility], [place] [address], [place], [place] [address]  Glucose, capillary     Status: Abnormal   Collection Time: 02/27/18  8:09 AM  Result Value Ref Range   Glucose-Capillary 109 (H) 70 - 99 mg/dL  Glucose, capillary     Status: Abnormal   Collection Time: 02/27/18 11:53 AM  Result Value Ref Range   Glucose-Capillary 173 (H) 70 - 99 mg/dL   Comment 1 Notify RN    Comment 2 Document in Chart   Glucose, capillary     Status: Abnormal   Collection Time: 02/27/18  4:51 PM  Result Value Ref Range   Glucose-Capillary 204 (H) 70 - 99 mg/dL   Comment 1 Notify RN    Comment 2 Document in Chart   Glucose, capillary     Status: Abnormal   Collection Time: 02/27/18  9:15 PM  Result Value Ref Range   Glucose-Capillary 169 (H) 70 - 99 mg/dL  Comprehensive metabolic panel     Status: Abnormal   Collection Time: 02/28/18  4:21 AM  Result Value Ref Range   Sodium 139 135 - 145 mmol/L   Potassium 4.3 3.5 - 5.1 mmol/L   Chloride 86 (L) 98 - 111 mmol/L   CO2 40 (H) 22 - 32 mmol/L   Glucose, Bld 154 (H) 70 - 99 mg/dL   BUN 55 (H) 8 - 23 mg/dL   Creatinine, Ser 1.93 (H) 0.44 - 1.00 mg/dL   Calcium 8.7 (L) 8.9 - 10.3 mg/dL   Total Protein 6.1 (L) 6.5 - 8.1 g/dL   Albumin 3.1 (L) 3.5 - 5.0 g/dL   AST 27 15 - 41 U/L   ALT 36 0 - 44 U/L   Alkaline Phosphatase 55 38 - 126 U/L    Total Bilirubin 0.5 0.3 - 1.2 mg/dL   GFR calc non Af Amer 26 (L) >60 mL/min   GFR calc Af Amer 30 (L) >60 mL/min    Comment: (NOTE) The eGFR has been calculated using the CKD EPI equation. This calculation has not been validated in all clinical situations. eGFR's persistently <60 mL/min signify possible Chronic Kidney Disease.    Anion gap 13 5 - 15    Comment: Performed at [facility], [place] [address] [place], [place] [address]  CBC with Differential/Platelet     Status: Abnormal   Collection Time: 02/28/18  4:21 AM  Result Value Ref Range   WBC 7.6 4.0 - 10.5 K/uL   RBC 3.80 (L) 3.87 - 5.11 MIL/uL   Hemoglobin 9.4 (L) 12.0 - 15.0 g/dL   HCT 34.1 (L) 36.0 - 46.0 %   MCV 89.7 80.0 - 100.0 fL   MCH 24.7 (L) 26.0 - 34.0 pg   MCHC 27.6 (L) 30.0 - 36.0 g/dL   RDW 16.7 (H) 11.5 - 15.5 %   Platelets 85 (L) 150 - 400 K/uL    Comment: Immature Platelet Fraction may be clinically indicated, consider ordering this additional test [id number]    nRBC 0.0 0.0 - 0.2 %   Neutrophils Relative % 81 %   Neutro Abs 6.1 1.7 - 7.7 K/uL   Lymphocytes Relative 7 %   Lymphs Abs 0.5 (L) 0.7 - 4.0 K/uL   Monocytes Relative 11 %   Monocytes Absolute 0.8 0.1 - 1.0 K/uL   Eosinophils Relative 0 %   Eosinophils Absolute 0.0  0.0 - 0.5 K/uL   Basophils Relative 0 %   Basophils Absolute 0.0 0.0 - 0.1 K/uL   Immature Granulocytes 1 %   Abs Immature Granulocytes 0.09 (H) 0.00 - 0.07 K/uL    Comment: Performed at [facility], [place] [address] [place], [place] [address]  Glucose, capillary     Status: Abnormal   Collection Time: 02/28/18  7:16 AM  Result Value Ref Range   Glucose-Capillary 128 (H) 70 - 99 mg/dL  Glucose, capillary     Status: Abnormal   Collection Time: 02/28/18 11:52 AM  Result Value Ref Range   Glucose-Capillary 146 (H) 70 - 99 mg/dL   Comment 1 Notify RN    Comment 2 Document in Chart   Glucose, capillary     Status: Abnormal   Collection  Time: 02/28/18  4:33 PM  Result Value Ref Range   Glucose-Capillary 243 (H) 70 - 99 mg/dL   Comment 1 Notify RN    Comment 2 Document in Chart   Glucose, capillary     Status: Abnormal   Collection Time: 02/28/18  9:44 PM  Result Value Ref Range   Glucose-Capillary 182 (H) 70 - 99 mg/dL  Comprehensive metabolic panel     Status: Abnormal   Collection Time: 03/01/18  5:36 AM  Result Value Ref Range   Sodium 138 135 - 145 mmol/L   Potassium 4.4 3.5 - 5.1 mmol/L   Chloride 88 (L) 98 - 111 mmol/L   CO2 40 (H) 22 - 32 mmol/L   Glucose, Bld 150 (H) 70 - 99 mg/dL   BUN 68 (H) 8 - 23 mg/dL   Creatinine, Ser 2.02 (H) 0.44 - 1.00 mg/dL   Calcium 8.8 (L) 8.9 - 10.3 mg/dL   Total Protein 7.0 6.5 - 8.1 g/dL   Albumin 3.7 3.5 - 5.0 g/dL   AST 17 15 - 41 U/L   ALT 29 0 - 44 U/L   Alkaline Phosphatase 57 38 - 126 U/L   Total Bilirubin 0.7 0.3 - 1.2 mg/dL   GFR calc non Af Amer 25 (L) >60 mL/min   GFR calc Af Amer 29 (L) >60 mL/min    Comment: (NOTE) The eGFR has been calculated using the CKD EPI equation. This calculation has not been validated in all clinical situations. eGFR's persistently <60 mL/min signify possible Chronic Kidney Disease.    Anion gap 10 5 - 15    Comment: Performed at [facility], [place] [address], [place], [place] [address]  CBC with Differential/Platelet     Status: Abnormal   Collection Time: 03/01/18  5:36 AM  Result Value Ref Range   WBC 7.1 4.0 - 10.5 K/uL   RBC 4.41 3.87 - 5.11 MIL/uL   Hemoglobin 11.1 (L) 12.0 - 15.0 g/dL   HCT 38.9 36.0 - 46.0 %   MCV 88.2 80.0 - 100.0 fL   MCH 25.2 (L) 26.0 - 34.0 pg   MCHC 28.5 (L) 30.0 - 36.0 g/dL   RDW 16.7 (H) 11.5 - 15.5 %   Platelets 90 (L) 150 - 400 K/uL    Comment: Immature Platelet Fraction may be clinically indicated, consider ordering this additional test [id number]    nRBC 0.0 0.0 - 0.2 %   Neutrophils Relative % 85 %   Neutro Abs 6.0 1.7 - 7.7 K/uL   Lymphocytes Relative 6 %    Lymphs Abs 0.4 (L) 0.7 - 4.0 K/uL   Monocytes Relative 7 %  Monocytes Absolute 0.5 0.1 - 1.0 K/uL   Eosinophils Relative 0 %   Eosinophils Absolute 0.0 0.0 - 0.5 K/uL   Basophils Relative 0 %   Basophils Absolute 0.0 0.0 - 0.1 K/uL   Immature Granulocytes 2 %   Abs Immature Granulocytes 0.12 (H) 0.00 - 0.07 K/uL    Comment: Performed at [facility], [place] [address] [place], [place] [address]  Phosphorus     Status: None   Collection Time: 03/01/18  5:36 AM  Result Value Ref Range   Phosphorus 4.6 2.5 - 4.6 mg/dL    Comment: Performed at [facility], [place] [address], [place], [place] [address]  Glucose, capillary     Status: Abnormal   Collection Time: 03/01/18  7:38 AM  Result Value Ref Range   Glucose-Capillary 137 (H) 70 - 99 mg/dL   Comment 1 Notify RN    Comment 2 Document in Chart   Glucose, capillary     Status: Abnormal   Collection Time: 03/01/18  1:10 PM  Result Value Ref Range   Glucose-Capillary 119 (H) 70 - 99 mg/dL   Comment 1 Notify RN    Comment 2 Document in Chart   Glucose, capillary     Status: Abnormal   Collection Time: 03/01/18  5:20 PM  Result Value Ref Range   Glucose-Capillary 192 (H) 70 - 99 mg/dL  Glucose, capillary     Status: Abnormal   Collection Time: 03/01/18  8:58 PM  Result Value Ref Range   Glucose-Capillary 152 (H) 70 - 99 mg/dL   Comment 1 Notify RN   Comprehensive metabolic panel     Status: Abnormal   Collection Time: 03/02/18  3:57 AM  Result Value Ref Range   Sodium 140 135 - 145 mmol/L   Potassium 4.1 3.5 - 5.1 mmol/L   Chloride 95 (L) 98 - 111 mmol/L   CO2 36 (H) 22 - 32 mmol/L   Glucose, Bld 140 (H) 70 - 99 mg/dL   BUN 67 (H) 8 - 23 mg/dL   Creatinine, Ser 1.98 (H) 0.44 - 1.00 mg/dL   Calcium 8.5 (L) 8.9 - 10.3 mg/dL   Total Protein 6.1 (L) 6.5 - 8.1 g/dL   Albumin 3.2 (L) 3.5 - 5.0 g/dL   AST 14 (L) 15 - 41 U/L   ALT 22 0 - 44 U/L   Alkaline Phosphatase 47 38 - 126 U/L   Total  Bilirubin 0.5 0.3 - 1.2 mg/dL   GFR calc non Af Amer 26 (L) >60 mL/min   GFR calc Af Amer 30 (L) >60 mL/min    Comment: (NOTE) The eGFR has been calculated using the CKD EPI equation. This calculation has not been validated in all clinical situations. eGFR's persistently <60 mL/min signify possible Chronic Kidney Disease.    Anion gap 9 5 - 15    Comment: Performed at [facility], [place] [address] [place], [place] [address]  CBC with Differential/Platelet     Status: Abnormal   Collection Time: 03/02/18  3:57 AM  Result Value Ref Range   WBC 5.5 4.0 - 10.5 K/uL   RBC 3.83 (L) 3.87 - 5.11 MIL/uL   Hemoglobin 9.6 (L) 12.0 - 15.0 g/dL   HCT 33.8 (L) 36.0 - 46.0 %   MCV 88.3 80.0 - 100.0 fL   MCH 25.1 (L) 26.0 - 34.0 pg   MCHC 28.4 (L) 30.0 - 36.0 g/dL   RDW 16.8 (H) 11.5 -  15.5 %   Platelets 79 (L) 150 - 400 K/uL    Comment: Immature Platelet Fraction may be clinically indicated, consider ordering this additional test [id number]    nRBC 0.0 0.0 - 0.2 %   Neutrophils Relative % 78 %   Neutro Abs 4.4 1.7 - 7.7 K/uL   Lymphocytes Relative 11 %   Lymphs Abs 0.6 (L) 0.7 - 4.0 K/uL   Monocytes Relative 8 %   Monocytes Absolute 0.4 0.1 - 1.0 K/uL   Eosinophils Relative 0 %   Eosinophils Absolute 0.0 0.0 - 0.5 K/uL   Basophils Relative 0 %   Basophils Absolute 0.0 0.0 - 0.1 K/uL   Immature Granulocytes 3 %   Abs Immature Granulocytes 0.15 (H) 0.00 - 0.07 K/uL    Comment: Performed at [facility], [place] [address] [place], [place] [address]  Glucose, capillary     Status: Abnormal   Collection Time: 03/02/18  7:28 AM  Result Value Ref Range   Glucose-Capillary 100 (H) 70 - 99 mg/dL  Glucose, capillary     Status: Abnormal   Collection Time: 03/02/18 11:35 AM  Result Value Ref Range   Glucose-Capillary 163 (H) 70 - 99 mg/dL   Comment 1 Notify RN    Comment 2 Document in Chart   Glucose, capillary     Status: Abnormal   Collection Time:  03/02/18  4:40 PM  Result Value Ref Range   Glucose-Capillary 237 (H) 70 - 99 mg/dL   Comment 1 Notify RN    Comment 2 Document in Chart   CBC w/Diff     Status: Abnormal   Collection Time: 03/14/18 12:04 PM  Result Value Ref Range   WBC 5.6 4.0 - 10.5 K/uL   RBC 4.08 3.87 - 5.11 Mil/uL   Hemoglobin 10.6 (L) 12.0 - 15.0 g/dL   HCT 34.3 (L) 36.0 - 46.0 %   MCV 84.0 78.0 - 100.0 fl   MCHC 31.0 30.0 - 36.0 g/dL   RDW 19.2 (H) 11.5 - 15.5 %   Platelets 61.0 (L) 150.0 - 400.0 K/uL   Neutrophils Relative % 77.7 (H) 43.0 - 77.0 %   Lymphocytes Relative 12.6 12.0 - 46.0 %   Monocytes Relative 8.2 3.0 - 12.0 %   Eosinophils Relative 1.3 0.0 - 5.0 %   Basophils Relative 0.2 0.0 - 3.0 %   Neutro Abs 4.3 1.4 - 7.7 K/uL   Lymphs Abs 0.7 0.7 - 4.0 K/uL   Monocytes Absolute 0.5 0.1 - 1.0 K/uL   Eosinophils Absolute 0.1 0.0 - 0.7 K/uL   Basophils Absolute 0.0 0.0 - 0.1 K/uL  Comp Met (CMET)     Status: Abnormal   Collection Time: 03/14/18 12:04 PM  Result Value Ref Range   Sodium 140 135 - 145 mEq/L   Potassium 4.9 3.5 - 5.1 mEq/L   Chloride 99 96 - 112 mEq/L   CO2 39 (H) 19 - 32 mEq/L   Glucose, Bld 61 (L) 70 - 99 mg/dL   BUN 30 (H) 6 - 23 mg/dL   Creatinine, Ser 1.18 0.40 - 1.20 mg/dL   Total Bilirubin 0.4 0.2 - 1.2 mg/dL   Alkaline Phosphatase 54 39 - 117 U/L   AST 14 0 - 37 U/L   ALT 12 0 - 35 U/L   Total Protein 6.3 6.0 - 8.3 g/dL   Albumin 3.8 3.5 - 5.2 g/dL   Calcium 8.9 8.4 - 10.5 mg/dL   GFR 48.91 (L) >60.00  mL/min  Comprehensive metabolic panel     Status: Abnormal   Collection Time: 04/07/18  4:42 PM  Result Value Ref Range   Sodium 138 135 - 145 mmol/L   Potassium 4.4 3.5 - 5.1 mmol/L   Chloride 93 (L) 98 - 111 mmol/L   CO2 38 (H) 22 - 32 mmol/L   Glucose, Bld 156 (H) 70 - 99 mg/dL   BUN 16 8 - 23 mg/dL   Creatinine, Ser 1.38 (H) 0.44 - 1.00 mg/dL   Calcium 8.2 (L) 8.9 - 10.3 mg/dL   Total Protein 5.8 (L) 6.5 - 8.1 g/dL   Albumin 3.1 (L) 3.5 - 5.0 g/dL   AST 13  (L) 15 - 41 U/L   ALT 10 0 - 44 U/L   Alkaline Phosphatase 46 38 - 126 U/L   Total Bilirubin 0.8 0.3 - 1.2 mg/dL   GFR calc non Af Amer 39 (L) >60 mL/min   GFR calc Af Amer 46 (L) >60 mL/min    Comment: (NOTE) The eGFR has been calculated using the CKD EPI equation. This calculation has not been validated in all clinical situations. eGFR's persistently <60 mL/min signify possible Chronic Kidney Disease.    Anion gap 7 5 - 15    Comment: Performed at [facility], [address] [place], [place] [address]  Brain natriuretic peptide     Status: Abnormal   Collection Time: 04/07/18  4:42 PM  Result Value Ref Range   B Natriuretic Peptide 597.0 (H) 0.0 - 100.0 pg/mL    Comment: Performed at [facility], [address], [place], [place] [address]  Troponin I - Once     Status: Abnormal   Collection Time: 04/07/18  4:42 PM  Result Value Ref Range   Troponin I 0.03 (HH) <0.03 ng/mL    Comment: CRITICAL RESULT CALLED TO, READ BACK BY AND VERIFIED WITH: OAKLEY,B ON 04/07/18 AT 1740 BY LOY,C Performed at [facility], [address] [place], [place] [address]   CBC with Differential     Status: Abnormal   Collection Time: 04/07/18  4:42 PM  Result Value Ref Range   WBC 4.5 4.0 - 10.5 K/uL   RBC 4.02 3.87 - 5.11 MIL/uL   Hemoglobin 9.6 (L) 12.0 - 15.0 g/dL   HCT 35.9 (L) 36.0 - 46.0 %   MCV 89.3 80.0 - 100.0 fL   MCH 23.9 (L) 26.0 - 34.0 pg   MCHC 26.7 (L) 30.0 - 36.0 g/dL   RDW 17.4 (H) 11.5 - 15.5 %   Platelets 49 (L) 150 - 400 K/uL    Comment: PLATELET COUNT CONFIRMED BY SMEAR SPECIMEN CHECKED FOR CLOTS    nRBC 0.0 0.0 - 0.2 %   Neutrophils Relative % 83 %   Neutro Abs 3.7 1.7 - 7.7 K/uL   Lymphocytes Relative 8 %   Lymphs Abs 0.4 (L) 0.7 - 4.0 K/uL   Monocytes Relative 9 %   Monocytes Absolute 0.4 0.1 - 1.0 K/uL   Eosinophils Relative 0 %   Eosinophils Absolute 0.0 0.0 - 0.5 K/uL   Basophils Relative 0 %   Basophils Absolute 0.0 0.0 - 0.1 K/uL   Immature Granulocytes 0 %    Abs Immature Granulocytes 0.02 0.00 - 0.07 K/uL    Comment: Performed at [facility], [address] [place], [place] [address]  CBG monitoring, ED     Status: Abnormal   Collection Time: 04/07/18  4:55 PM  Result Value Ref Range  Glucose-Capillary 140 (H) 70 - 99 mg/dL  Urinalysis, Routine w reflex microscopic     Status: Abnormal   Collection Time: 04/07/18  6:20 PM  Result Value Ref Range   Color, Urine YELLOW YELLOW   APPearance HAZY (A) CLEAR   Specific Gravity, Urine 1.015 1.005 - 1.030   pH 6.0 5.0 - 8.0   Glucose, UA NEGATIVE NEGATIVE mg/dL   Hgb urine dipstick SMALL (A) NEGATIVE   Bilirubin Urine NEGATIVE NEGATIVE   Ketones, ur NEGATIVE NEGATIVE mg/dL   Protein, ur 30 (A) NEGATIVE mg/dL   Nitrite NEGATIVE NEGATIVE   Leukocytes, UA NEGATIVE NEGATIVE   RBC / HPF 6-10 0 - 5 RBC/hpf   WBC, UA 6-10 0 - 5 WBC/hpf   Bacteria, UA RARE (A) NONE SEEN   Squamous Epithelial / LPF 11-20 0 - 5    Comment: Performed at [facility], [address], [place], [place] [address]  Glucose, capillary     Status: Abnormal   Collection Time: 04/07/18  9:06 PM  Result Value Ref Range   Glucose-Capillary 128 (H) 70 - 99 mg/dL   Comment 1 Notify RN    Comment 2 Document in Chart   Basic metabolic panel     Status: Abnormal   Collection Time: 04/08/18  6:45 AM  Result Value Ref Range   Sodium 140 135 - 145 mmol/L   Potassium 4.3 3.5 - 5.1 mmol/L   Chloride 95 (L) 98 - 111 mmol/L   CO2 41 (H) 22 - 32 mmol/L   Glucose, Bld 114 (H) 70 - 99 mg/dL   BUN 16 8 - 23 mg/dL   Creatinine, Ser 1.35 (H) 0.44 - 1.00 mg/dL   Calcium 8.1 (L) 8.9 - 10.3 mg/dL   GFR calc non Af Amer 41 (L) >60 mL/min   GFR calc Af Amer 47 (L) >60 mL/min    Comment: (NOTE) The eGFR has been calculated using the CKD EPI equation. This calculation has not been validated in all clinical situations. eGFR's persistently <60 mL/min signify possible Chronic Kidney Disease.    Anion gap 4 (L) 5 - 15    Comment: Performed  at [facility], [address] [place], [place] [address]  Glucose, capillary     Status: None   Collection Time: 04/08/18  8:01 AM  Result Value Ref Range   Glucose-Capillary 92 70 - 99 mg/dL   Comment 1 Notify RN    Comment 2 Document in Chart   ECHOCARDIOGRAM COMPLETE     Status: None   Collection Time: 04/08/18 11:37 AM  Result Value Ref Range   Weight 4,797.21 oz   Height 64 in   BP 110/55 mmHg  Glucose, capillary     Status: Abnormal   Collection Time: 04/08/18 11:55 AM  Result Value Ref Range   Glucose-Capillary 139 (H) 70 - 99 mg/dL   Comment 1 Notify RN    Comment 2 Document in Chart   Glucose, capillary     Status: Abnormal   Collection Time: 04/08/18  4:48 PM  Result Value Ref Range   Glucose-Capillary 203 (H) 70 - 99 mg/dL   Comment 1 Notify RN    Comment 2 Document in Chart   Glucose, capillary     Status: Abnormal   Collection Time: 04/08/18 10:14 PM  Result Value Ref Range   Glucose-Capillary 282 (H) 70 - 99 mg/dL   Comment 1 Notify RN    Comment 2 Document in Chart   Basic metabolic  panel     Status: Abnormal   Collection Time: 04/09/18  5:38 AM  Result Value Ref Range   Sodium 139 135 - 145 mmol/L   Potassium 4.2 3.5 - 5.1 mmol/L   Chloride 90 (L) 98 - 111 mmol/L   CO2 40 (H) 22 - 32 mmol/L   Glucose, Bld 260 (H) 70 - 99 mg/dL   BUN 23 8 - 23 mg/dL   Creatinine, Ser 1.58 (H) 0.44 - 1.00 mg/dL   Calcium 8.5 (L) 8.9 - 10.3 mg/dL   GFR calc non Af Amer 34 (L) >60 mL/min   GFR calc Af Amer 39 (L) >60 mL/min    Comment: (NOTE) The eGFR has been calculated using the CKD EPI equation. This calculation has not been validated in all clinical situations. eGFR's persistently <60 mL/min signify possible Chronic Kidney Disease.    Anion gap 9 5 - 15    Comment: Performed at [facility], [address] [place], [place] [address]  Magnesium     Status: None   Collection Time: 04/09/18  5:38 AM  Result Value Ref Range   Magnesium 1.7 1.7 - 2.4 mg/dL     Comment: Performed at [facility], [address], [place], [place] [address]  Vitamin B12     Status: None   Collection Time: 04/09/18  5:39 AM  Result Value Ref Range   Vitamin B-12 368 180 - 914 pg/mL    Comment: (NOTE) This assay is not validated for testing neonatal or myeloproliferative syndrome specimens for Vitamin B12 levels. Performed at [facility], [address], [place], [place] [address]   Folate     Status: Abnormal   Collection Time: 04/09/18  5:39 AM  Result Value Ref Range   Folate 5.9 (L) >5.9 ng/mL    Comment: Performed at [facility], [address], [place], [place] [address]  Hemoglobin A1c     Status: None   Collection Time: 04/09/18  5:39 AM  Result Value Ref Range   Hgb A1c MFr Bld 5.2 4.8 - 5.6 %    Comment: (NOTE) Pre diabetes:          5.7%-6.4% Diabetes:              >6.4% Glycemic control for   <7.0% adults with diabetes    Mean Plasma Glucose 102.54 mg/dL    Comment: Performed at [place] [address], [place],  [address]  Glucose, capillary     Status: Abnormal   Collection Time: 04/09/18  7:59 AM  Result Value Ref Range   Glucose-Capillary 270 (H) 70 - 99 mg/dL   Comment 1 Notify RN    Comment 2 Document in Chart   Glucose, capillary     Status: Abnormal   Collection Time: 04/09/18 11:26 AM  Result Value Ref Range   Glucose-Capillary 225 (H) 70 - 99 mg/dL   Comment 1 Notify RN    Comment 2 Document in Chart   Glucose, capillary     Status: Abnormal   Collection Time: 04/09/18  4:41 PM  Result Value Ref Range   Glucose-Capillary 222 (H) 70 - 99 mg/dL   Comment 1 Notify RN    Comment 2 Document in Chart   Glucose, capillary     Status: Abnormal   Collection Time: 04/09/18 10:55 PM  Result Value Ref Range   Glucose-Capillary 224 (H) 70 - 99 mg/dL  Basic metabolic panel     Status: Abnormal  Collection Time: 04/10/18  5:46 AM  Result Value Ref Range   Sodium 138 135 - 145 mmol/L   Potassium 5.1 3.5 - 5.1 mmol/L     Comment: DELTA CHECK NOTED   Chloride 89 (L) 98 - 111 mmol/L   CO2 40 (H) 22 - 32 mmol/L   Glucose, Bld 210 (H) 70 - 99 mg/dL   BUN 29 (H) 8 - 23 mg/dL   Creatinine, Ser 1.37 (H) 0.44 - 1.00 mg/dL   Calcium 8.7 (L) 8.9 - 10.3 mg/dL   GFR calc non Af Amer 40 (L) >60 mL/min   GFR calc Af Amer 46 (L) >60 mL/min    Comment: (NOTE) The eGFR has been calculated using the CKD EPI equation. This calculation has not been validated in all clinical situations. eGFR's persistently <60 mL/min signify possible Chronic Kidney Disease.    Anion gap 9 5 - 15    Comment: Performed at [facility], [address], [place], [place] [address]  Glucose, capillary     Status: Abnormal   Collection Time: 04/10/18  8:11 AM  Result Value Ref Range   Glucose-Capillary 167 (H) 70 - 99 mg/dL  Glucose, capillary     Status: Abnormal   Collection Time: 04/10/18 11:38 AM  Result Value Ref Range   Glucose-Capillary 206 (H) 70 - 99 mg/dL   Comment 1 Notify RN   Glucose, capillary     Status: Abnormal   Collection Time: 04/10/18  4:58 PM  Result Value Ref Range   Glucose-Capillary 172 (H) 70 - 99 mg/dL   Comment 1 Notify RN    Comment 2 Document in Chart   Glucose, capillary     Status: Abnormal   Collection Time: 04/10/18 10:19 PM  Result Value Ref Range   Glucose-Capillary 182 (H) 70 - 99 mg/dL  Basic metabolic panel     Status: Abnormal   Collection Time: 04/11/18  5:09 AM  Result Value Ref Range   Sodium 138 135 - 145 mmol/L   Potassium 4.5 3.5 - 5.1 mmol/L   Chloride 88 (L) 98 - 111 mmol/L   CO2 40 (H) 22 - 32 mmol/L   Glucose, Bld 176 (H) 70 - 99 mg/dL   BUN 41 (H) 8 - 23 mg/dL   Creatinine, Ser 1.59 (H) 0.44 - 1.00 mg/dL   Calcium 8.6 (L) 8.9 - 10.3 mg/dL   GFR calc non Af Amer 33 (L) >60 mL/min   GFR calc Af Amer 39 (L) >60 mL/min    Comment: (NOTE) The eGFR has been calculated using the CKD EPI equation. This calculation has not been validated in all clinical situations. eGFR's  persistently <60 mL/min signify possible Chronic Kidney Disease.    Anion gap 10 5 - 15    Comment: Performed at [facility], [address], [place],  [address]  Glucose, capillary     Status: Abnormal   Collection Time: 04/11/18  7:30 AM  Result Value Ref Range   Glucose-Capillary 155 (H) 70 - 99 mg/dL  Glucose, capillary     Status: Abnormal   Collection Time: 04/11/18 11:09 AM  Result Value Ref Range   Glucose-Capillary 222 (H) 70 - 99 mg/dL  CBC w/Diff     Status: Abnormal   Collection Time: 04/18/18 11:35 AM  Result Value Ref Range   WBC 9.5 4.0 - 10.5 K/uL   RBC 4.77 3.87 - 5.11 Mil/uL   Hemoglobin 11.8 (L) 12.0 - 15.0 g/dL   HCT 37.8  36.0 - 46.0 %   MCV 79.4 78.0 - 100.0 fl   MCHC 31.1 30.0 - 36.0 g/dL   RDW 18.9 (H) 11.5 - 15.5 %   Platelets 87.0 (L) 150.0 - 400.0 K/uL   Neutrophils Relative % (H) 43.0 - 77.0 %    90.2 Manual smear review agrees with instrument differential.   Lymphocytes Relative 4.9 (L) 12.0 - 46.0 %   Monocytes Relative 4.2 3.0 - 12.0 %   Eosinophils Relative 0.5 0.0 - 5.0 %   Basophils Relative 0.2 0.0 - 3.0 %   Neutro Abs 8.5 (H) 1.4 - 7.7 K/uL   Lymphs Abs 0.5 (L) 0.7 - 4.0 K/uL   Monocytes Absolute 0.4 0.1 - 1.0 K/uL   Eosinophils Absolute 0.0 0.0 - 0.7 K/uL   Basophils Absolute 0.0 0.0 - 0.1 K/uL  Comp Met (CMET)     Status: Abnormal   Collection Time: 04/18/18 11:35 AM  Result Value Ref Range   Sodium 138 135 - 145 mEq/L   Potassium 4.6 3.5 - 5.1 mEq/L   Chloride 91 (L) 96 - 112 mEq/L   CO2 42 (H) 19 - 32 mEq/L   Glucose, Bld 76 70 - 99 mg/dL   BUN 38 (H) 6 - 23 mg/dL   Creatinine, Ser 1.20 0.40 - 1.20 mg/dL   Total Bilirubin 0.6 0.2 - 1.2 mg/dL   Alkaline Phosphatase 49 39 - 117 U/L   AST 16 0 - 37 U/L   ALT 29 0 - 35 U/L   Total Protein 6.2 6.0 - 8.3 g/dL   Albumin 3.8 3.5 - 5.2 g/dL   Calcium 9.2 8.4 - 10.5 mg/dL   GFR 47.96 (L) >60.00 mL/min    Assessment/Plan: 1. Chronic respiratory failure with hypoxia  (HCC) Stable today with good O2 sats -- 3L/min at rest and 4L wiuth exertion. O2 stable with ambulation.   2. COPD exacerbation ([place]) 3. Acute bacterial bronchitis Start 5-day 40 mg prednisone burst. Continue chronic COPD medications. Continue continuous O2. Rx Doxycycline. Supportive measures and OTC medications reviewed. 3 day follow-up scheduled. Very strict ER precautions given to patient.  [name], PA-C

## 2018-05-18 DIAGNOSIS — E1143 Type 2 diabetes mellitus with diabetic autonomic (poly)neuropathy: Secondary | ICD-10-CM | POA: Diagnosis not present

## 2018-05-18 DIAGNOSIS — K3184 Gastroparesis: Secondary | ICD-10-CM | POA: Diagnosis not present

## 2018-05-18 DIAGNOSIS — M1991 Primary osteoarthritis, unspecified site: Secondary | ICD-10-CM | POA: Diagnosis not present

## 2018-05-18 DIAGNOSIS — I872 Venous insufficiency (chronic) (peripheral): Secondary | ICD-10-CM | POA: Diagnosis not present

## 2018-05-18 DIAGNOSIS — M797 Fibromyalgia: Secondary | ICD-10-CM | POA: Diagnosis not present

## 2018-05-18 DIAGNOSIS — Z7951 Long term (current) use of inhaled steroids: Secondary | ICD-10-CM | POA: Diagnosis not present

## 2018-05-18 DIAGNOSIS — J9621 Acute and chronic respiratory failure with hypoxia: Secondary | ICD-10-CM | POA: Diagnosis not present

## 2018-05-18 DIAGNOSIS — I130 Hypertensive heart and chronic kidney disease with heart failure and stage 1 through stage 4 chronic kidney disease, or unspecified chronic kidney disease: Secondary | ICD-10-CM | POA: Diagnosis not present

## 2018-05-18 DIAGNOSIS — Z9981 Dependence on supplemental oxygen: Secondary | ICD-10-CM | POA: Diagnosis not present

## 2018-05-18 DIAGNOSIS — I471 Supraventricular tachycardia: Secondary | ICD-10-CM | POA: Diagnosis not present

## 2018-05-18 DIAGNOSIS — G894 Chronic pain syndrome: Secondary | ICD-10-CM | POA: Diagnosis not present

## 2018-05-18 DIAGNOSIS — J439 Emphysema, unspecified: Secondary | ICD-10-CM | POA: Diagnosis not present

## 2018-05-18 DIAGNOSIS — E1140 Type 2 diabetes mellitus with diabetic neuropathy, unspecified: Secondary | ICD-10-CM | POA: Diagnosis not present

## 2018-05-18 DIAGNOSIS — I5033 Acute on chronic diastolic (congestive) heart failure: Secondary | ICD-10-CM | POA: Diagnosis not present

## 2018-05-18 DIAGNOSIS — N183 Chronic kidney disease, stage 3 (moderate): Secondary | ICD-10-CM | POA: Diagnosis not present

## 2018-05-18 DIAGNOSIS — E1151 Type 2 diabetes mellitus with diabetic peripheral angiopathy without gangrene: Secondary | ICD-10-CM | POA: Diagnosis not present

## 2018-05-18 DIAGNOSIS — G2581 Restless legs syndrome: Secondary | ICD-10-CM | POA: Diagnosis not present

## 2018-05-18 DIAGNOSIS — I2781 Cor pulmonale (chronic): Secondary | ICD-10-CM | POA: Diagnosis not present

## 2018-05-18 DIAGNOSIS — Z794 Long term (current) use of insulin: Secondary | ICD-10-CM | POA: Diagnosis not present

## 2018-05-18 DIAGNOSIS — J9622 Acute and chronic respiratory failure with hypercapnia: Secondary | ICD-10-CM | POA: Diagnosis not present

## 2018-05-18 DIAGNOSIS — E1122 Type 2 diabetes mellitus with diabetic chronic kidney disease: Secondary | ICD-10-CM | POA: Diagnosis not present

## 2018-05-18 DIAGNOSIS — G4733 Obstructive sleep apnea (adult) (pediatric): Secondary | ICD-10-CM | POA: Diagnosis not present

## 2018-05-19 ENCOUNTER — Telehealth: Payer: Self-pay | Admitting: Physician Assistant

## 2018-05-19 ENCOUNTER — Other Ambulatory Visit: Payer: Self-pay | Admitting: Physician Assistant

## 2018-05-19 ENCOUNTER — Ambulatory Visit (INDEPENDENT_AMBULATORY_CARE_PROVIDER_SITE_OTHER): Payer: Medicare Other | Admitting: Family Medicine

## 2018-05-19 ENCOUNTER — Encounter: Payer: Self-pay | Admitting: Family Medicine

## 2018-05-19 ENCOUNTER — Other Ambulatory Visit: Payer: Self-pay

## 2018-05-19 VITALS — BP 128/50 | HR 94 | Temp 98.7°F | Resp 20 | Ht 64.0 in | Wt 254.0 lb

## 2018-05-19 DIAGNOSIS — J441 Chronic obstructive pulmonary disease with (acute) exacerbation: Secondary | ICD-10-CM

## 2018-05-19 DIAGNOSIS — I272 Pulmonary hypertension, unspecified: Secondary | ICD-10-CM

## 2018-05-19 DIAGNOSIS — J42 Unspecified chronic bronchitis: Secondary | ICD-10-CM

## 2018-05-19 DIAGNOSIS — I5033 Acute on chronic diastolic (congestive) heart failure: Secondary | ICD-10-CM

## 2018-05-19 DIAGNOSIS — J9611 Chronic respiratory failure with hypoxia: Secondary | ICD-10-CM

## 2018-05-19 NOTE — Telephone Encounter (Signed)
Copied from [place] [id number]. Topic: Quick Communication - See Telephone Encounter >> May 19, 2018  9:05 AM Sheran Luz wrote: CRM for notification. See Telephone encounter for: 05/19/18.  Ria Comment with [facility] called regarding referral sent for patient. Ria Comment states that referral cannot be accepted as there are two diagnoses attached and only one can be accepted. Ria Comment is requesting a call back to discuss further. Please advise.   Ria Comment cb# [phone]

## 2018-05-19 NOTE — Patient Instructions (Signed)
Please return as needed   You seem to be improving. Continue your medications.  Follow up with Dr. Melvyn Novas as recommended.  If you have any questions or concerns, please don't hesitate to send me a message via MyChart or call the office at [phone]. Thank you for visiting with Renee Noble today! It's our pleasure caring for you.

## 2018-05-19 NOTE — Progress Notes (Signed)
Subjective  CC:  Chief Complaint  Patient presents with  . Follow-up    COPD exacerbation.. She reports coughing is better but, still SOB    HPI: Renee Noble is a 63 y.o. female who presents to the office today to address the problems listed above in the chief complaint. Same day acute visit; PCP not available. New pt to me. Chart reviewed.    63 year old with oxygen requiring COPD recently treated by her PCP for COPD exacerbation with doxycycline and steroid burst.  Here for short-term follow-up.  Patient says she is improving.  Less shortness of breath.  Oxygenation is maintained stability between 3 L and 4 L depending on activity level.  Working with home health and home PT and doing well.  Still with cough and upper airway congestion but less wheezing.  No fevers or myalgias.  No chest pain.  I reviewed most recent note.  She does have follow-up with her pulmonologist Assessment  1. COPD exacerbation (HCC)      Plan   COPD exacerbation, improving: to complete antibiotics and steroid burst.  Continue home regimen of COPD medications.  Follow-up with pulmonology.  Return here if symptoms recur or worsen  Follow up: f/u with pulm as scheduled. Sooner if worsening  Visit date not found  No orders of the defined types were placed in this encounter.  No orders of the defined types were placed in this encounter.     I reviewed the patients updated PMH, FH, and SocHx.    Patient Active Problem List   Diagnosis Date Noted  . Lung nodule 05/08/2018  . Morbid obesity with BMI of 50.0-59.9, adult ([place]) 04/10/2018  . Chronic pain 04/07/2018  . Acute on chronic diastolic CHF (congestive heart failure) ([place]) 04/07/2018  . PNA (pneumonia) 03/21/2018  . COPD (chronic obstructive pulmonary disease) ([place]) 02/25/2018  . Chronic pain syndrome   . Normocytic anemia 02/24/2018  . OSA (obstructive sleep apnea) 08/16/2017  . Morbid obesity due to excess calories ([place]) 04/29/2017  . Cor  pulmonale (chronic) ([place]) 04/29/2017  . Chronic respiratory failure with hypoxia and hypercapnia ([place]) 04/28/2017  . CKD (chronic kidney disease), stage III ([place]) 04/14/2016  . Hyperlipidemia 01/16/2016  . Essential hypertension 01/16/2016  . Hypoxia   . Thrombocytopenia ([place]) 07/12/2015  . COPD with acute exacerbation ([place])   . Thyroid activity decreased 06/23/2015  . Encounter for chronic pain management 09/02/2014  . Hernia of abdominal cavity 07/30/2014  . Abnormal CXR 02/15/2014  . COPD GOLDIII with min reversibility  12/16/2013  . History of hepatitis C 12/16/2013  . Breast cancer screening 12/16/2013  . Diabetes mellitus, type II, insulin dependent ([place]) 12/16/2013  . Screening for osteoporosis 12/16/2013   Current Meds  Medication Sig  . albuterol (PROAIR HFA) 108 (90 Base) MCG/ACT inhaler Inhale 1-2 puffs into the lungs every 6 (six) hours as needed for wheezing or shortness of breath.  . benzonatate (TESSALON) 200 MG capsule Take 1 capsule (200 mg total) by mouth 3 (three) times daily as needed for cough.  . budesonide-formoterol (SYMBICORT) 160-4.5 MCG/ACT inhaler Inhale 2 puffs into the lungs 2 (two) times daily.  . clotrimazole-betamethasone (LOTRISONE) cream Apply 1 application topically 2 (two) times daily as needed.  . diltiazem (CARDIZEM CD) 180 MG 24 hr capsule Take 1 capsule (180 mg total) by mouth daily.  [name] Kitchen doxycycline (VIBRAMYCIN) 100 MG capsule Take 1 capsule (100 mg total) by mouth 2 (two) times daily.  [name] Kitchen glucose blood (GLUCOSE METER  TEST) test strip AccuCheck Inform 2 stips Check blood sugars twice daily Dx E11.9  . guaiFENesin (MUCINEX) 600 MG 12 hr tablet Take 1 tablet (600 mg total) by mouth 2 (two) times daily.  [name] Kitchen HUMALOG 100 UNIT/ML injection INJECT 0.15 ML (15 UNITS) INTO THE SKIN 3 TIMES DAILY AS NEEDED FOR HIGH BLOOD SUGAR (Patient taking differently: Inject 15 Units into the skin 3 (three) times daily as needed for high blood sugar. Uses sliding scale.  Sugar over 200 will give insulin.)  . insulin glargine (LANTUS) 100 UNIT/ML injection INJECT 0.3MLS (30 UNITS TOTAL) INTO THE SKIN EVERY DAY (Patient taking differently: Inject 30 Units into the skin every morning. INJECT 0.3MLS (30 UNITS TOTAL) INTO THE SKIN EVERY DAY)  . ipratropium-albuterol (DUONEB) 0.5-2.5 (3) MG/3ML SOLN Take 3 mLs by nebulization 4 (four) times daily. Dx: J44.9  . levothyroxine (SYNTHROID, LEVOTHROID) 88 MCG tablet Take 1 tablet (88 mcg total) by mouth daily before breakfast. (Patient taking differently: Take 88 mcg by mouth at bedtime. )  . losartan (COZAAR) 25 MG tablet Take 25 mg by mouth daily.  . metoCLOPramide (REGLAN) 10 MG tablet Take 1 tablet (10 mg total) by mouth every 6 (six) hours as needed for nausea. (Patient taking differently: Take 10 mg by mouth 2 (two) times daily. )  . morphine (MS CONTIN) 30 MG 12 hr tablet Take 30 mg by mouth every 12 (twelve) hours.  . Multiple Vitamins-Minerals (CENTRUM SILVER PO) Take by mouth.  . nortriptyline (PAMELOR) 25 MG capsule Take 1 capsule (25 mg total) by mouth at bedtime.  [name] Kitchen nystatin (MYCOSTATIN/NYSTOP) powder APPLY TOPICALLY TWICE DAILY AS NEEDED FOR YEAST INFECTION  . OXYGEN O2 3L at home.  . pravastatin (PRAVACHOL) 20 MG tablet TAKE ONE TABLET BY MOUTH EVERY DAY  . predniSONE (DELTASONE) 20 MG tablet Take 2 tablets (40 mg total) by mouth daily with breakfast.  . Probiotic Product (PROBIOTIC PO) Take by mouth.  Haig Prophet COMFORT INSULIN SYRINGE 30G X 1/2" 1 ML MISC USE FOUR (4) TIMES DAILY AS DIRECTED (Patient taking differently: Inject 1 Syringe into the skin 4 (four) times daily. )  . tiZANidine (ZANAFLEX) 2 MG tablet Take 2-4 mg by mouth every 12 (twelve) hours as needed for muscle spasms.  [name] Kitchen torsemide (DEMADEX) 20 MG tablet Take 1 tablet (20 mg total) by mouth daily.  . varenicline (CHANTIX PAK) 0.5 MG X 11 & 1 MG X 42 tablet Take one 0.5 mg tablet by mouth QD x 3 days, then increase to one 0.5 mg tablet BID x 4 days,  then increase to one 1 mg tablet BID.    Allergies: Patient is allergic to gabapentin; lyrica [pregabalin]; ketorolac tromethamine; and lisinopril. Family History: Patient family history includes Alcoholism in her father, mother, and paternal grandfather; Brain cancer in her maternal aunt; Breast cancer in her maternal aunt; COPD (age of onset: 10) in her father; Cancer in her sister; Diabetes in her maternal grandmother and sister; Emphysema in her father, maternal grandfather, and mother; Esophageal varices in her father; Heart attack (age of onset: 56) in her mother; Heart defect in her sister and sister; Heart disease in her maternal grandmother and mother; Lung cancer in her maternal grandfather; Obesity in her son. Social History:  Patient  reports that she quit smoking about 21 months ago. Her smoking use included cigarettes. She has a 112.50 pack-year smoking history. She has never used smokeless tobacco. She reports that she does not drink alcohol or use drugs.  Review  of Systems: Constitutional: Negative for fever malaise or anorexia Cardiovascular: negative for chest pain Respiratory: negative for SOB or persistent cough Gastrointestinal: negative for abdominal pain  Objective  Vitals: BP (!) 128/50   Pulse 94   Temp 98.7 F (37.1 C) (Oral)   Resp 20   Ht 5\' 4"  (1.626 m)   Wt 254 lb (115.2 kg)   SpO2 96% Comment: 4 L oxygen  BMI 43.60 kg/m  General: no acute distress , A&Ox3, wearing oxygen. Speaking in full sentences, no respiratory distress HEENT: PEERL, conjunctiva normal, Oropharynx moist,neck is supple Cardiovascular:  RRR without murmur or gallop.  Respiratory: Diminished breath sounds bilaterally, CTAB with normal respiratory effort, no wheezing or rales or rhonchi Skin:  Warm, no rashes     Commons side effects, risks, benefits, and alternatives for medications and treatment plan prescribed today were discussed, and the patient expressed understanding of the  given instructions. Patient is instructed to call or message via MyChart if he/she has any questions or concerns regarding our treatment plan. No barriers to understanding were identified. We discussed Red Flag symptoms and signs in detail. Patient expressed understanding regarding what to do in case of urgent or emergency type symptoms.   Medication list was reconciled, printed and provided to the patient in AVS. Patient instructions and summary information was reviewed with the patient as documented in the AVS. This note was prepared with assistance of Dragon voice recognition software. Occasional wrong-word or sound-a-like substitutions may have occurred due to the inherent limitations of voice recognition software

## 2018-05-19 NOTE — Telephone Encounter (Signed)
Called and spoke with lindsay who advised that pt did not qualify for care with the COPD Gold III Dx.   New referral placed with respite care dx's as requested.   Ok per Dr. Birdie Riddle.

## 2018-05-23 DIAGNOSIS — M1991 Primary osteoarthritis, unspecified site: Secondary | ICD-10-CM | POA: Diagnosis not present

## 2018-05-23 DIAGNOSIS — Z7951 Long term (current) use of inhaled steroids: Secondary | ICD-10-CM | POA: Diagnosis not present

## 2018-05-23 DIAGNOSIS — N183 Chronic kidney disease, stage 3 (moderate): Secondary | ICD-10-CM | POA: Diagnosis not present

## 2018-05-23 DIAGNOSIS — K3184 Gastroparesis: Secondary | ICD-10-CM | POA: Diagnosis not present

## 2018-05-23 DIAGNOSIS — M797 Fibromyalgia: Secondary | ICD-10-CM | POA: Diagnosis not present

## 2018-05-23 DIAGNOSIS — I471 Supraventricular tachycardia: Secondary | ICD-10-CM | POA: Diagnosis not present

## 2018-05-23 DIAGNOSIS — G894 Chronic pain syndrome: Secondary | ICD-10-CM | POA: Diagnosis not present

## 2018-05-23 DIAGNOSIS — E1151 Type 2 diabetes mellitus with diabetic peripheral angiopathy without gangrene: Secondary | ICD-10-CM | POA: Diagnosis not present

## 2018-05-23 DIAGNOSIS — E1140 Type 2 diabetes mellitus with diabetic neuropathy, unspecified: Secondary | ICD-10-CM | POA: Diagnosis not present

## 2018-05-23 DIAGNOSIS — G4733 Obstructive sleep apnea (adult) (pediatric): Secondary | ICD-10-CM | POA: Diagnosis not present

## 2018-05-23 DIAGNOSIS — Z9981 Dependence on supplemental oxygen: Secondary | ICD-10-CM | POA: Diagnosis not present

## 2018-05-23 DIAGNOSIS — G2581 Restless legs syndrome: Secondary | ICD-10-CM | POA: Diagnosis not present

## 2018-05-23 DIAGNOSIS — E1143 Type 2 diabetes mellitus with diabetic autonomic (poly)neuropathy: Secondary | ICD-10-CM | POA: Diagnosis not present

## 2018-05-23 DIAGNOSIS — E1122 Type 2 diabetes mellitus with diabetic chronic kidney disease: Secondary | ICD-10-CM | POA: Diagnosis not present

## 2018-05-23 DIAGNOSIS — J9622 Acute and chronic respiratory failure with hypercapnia: Secondary | ICD-10-CM | POA: Diagnosis not present

## 2018-05-23 DIAGNOSIS — I2781 Cor pulmonale (chronic): Secondary | ICD-10-CM | POA: Diagnosis not present

## 2018-05-23 DIAGNOSIS — I872 Venous insufficiency (chronic) (peripheral): Secondary | ICD-10-CM | POA: Diagnosis not present

## 2018-05-23 DIAGNOSIS — J9621 Acute and chronic respiratory failure with hypoxia: Secondary | ICD-10-CM | POA: Diagnosis not present

## 2018-05-23 DIAGNOSIS — I5033 Acute on chronic diastolic (congestive) heart failure: Secondary | ICD-10-CM | POA: Diagnosis not present

## 2018-05-23 DIAGNOSIS — Z794 Long term (current) use of insulin: Secondary | ICD-10-CM | POA: Diagnosis not present

## 2018-05-23 DIAGNOSIS — J439 Emphysema, unspecified: Secondary | ICD-10-CM | POA: Diagnosis not present

## 2018-05-23 DIAGNOSIS — I130 Hypertensive heart and chronic kidney disease with heart failure and stage 1 through stage 4 chronic kidney disease, or unspecified chronic kidney disease: Secondary | ICD-10-CM | POA: Diagnosis not present

## 2018-05-25 DIAGNOSIS — Z79891 Long term (current) use of opiate analgesic: Secondary | ICD-10-CM | POA: Diagnosis not present

## 2018-05-25 DIAGNOSIS — G894 Chronic pain syndrome: Secondary | ICD-10-CM | POA: Diagnosis not present

## 2018-05-25 DIAGNOSIS — M47817 Spondylosis without myelopathy or radiculopathy, lumbosacral region: Secondary | ICD-10-CM | POA: Diagnosis not present

## 2018-05-25 DIAGNOSIS — Z79899 Other long term (current) drug therapy: Secondary | ICD-10-CM | POA: Diagnosis not present

## 2018-05-25 DIAGNOSIS — M542 Cervicalgia: Secondary | ICD-10-CM | POA: Diagnosis not present

## 2018-05-25 DIAGNOSIS — M5136 Other intervertebral disc degeneration, lumbar region: Secondary | ICD-10-CM | POA: Diagnosis not present

## 2018-05-26 ENCOUNTER — Telehealth (HOSPITAL_COMMUNITY): Payer: Self-pay

## 2018-05-26 ENCOUNTER — Ambulatory Visit: Payer: Medicare Other | Admitting: Internal Medicine

## 2018-05-26 DIAGNOSIS — E1140 Type 2 diabetes mellitus with diabetic neuropathy, unspecified: Secondary | ICD-10-CM | POA: Diagnosis not present

## 2018-05-26 DIAGNOSIS — G894 Chronic pain syndrome: Secondary | ICD-10-CM | POA: Diagnosis not present

## 2018-05-26 DIAGNOSIS — Z9981 Dependence on supplemental oxygen: Secondary | ICD-10-CM | POA: Diagnosis not present

## 2018-05-26 DIAGNOSIS — K3184 Gastroparesis: Secondary | ICD-10-CM | POA: Diagnosis not present

## 2018-05-26 DIAGNOSIS — M1991 Primary osteoarthritis, unspecified site: Secondary | ICD-10-CM | POA: Diagnosis not present

## 2018-05-26 DIAGNOSIS — G2581 Restless legs syndrome: Secondary | ICD-10-CM | POA: Diagnosis not present

## 2018-05-26 DIAGNOSIS — I2781 Cor pulmonale (chronic): Secondary | ICD-10-CM | POA: Diagnosis not present

## 2018-05-26 DIAGNOSIS — Z7951 Long term (current) use of inhaled steroids: Secondary | ICD-10-CM | POA: Diagnosis not present

## 2018-05-26 DIAGNOSIS — J9622 Acute and chronic respiratory failure with hypercapnia: Secondary | ICD-10-CM | POA: Diagnosis not present

## 2018-05-26 DIAGNOSIS — E1122 Type 2 diabetes mellitus with diabetic chronic kidney disease: Secondary | ICD-10-CM | POA: Diagnosis not present

## 2018-05-26 DIAGNOSIS — I130 Hypertensive heart and chronic kidney disease with heart failure and stage 1 through stage 4 chronic kidney disease, or unspecified chronic kidney disease: Secondary | ICD-10-CM | POA: Diagnosis not present

## 2018-05-26 DIAGNOSIS — Z794 Long term (current) use of insulin: Secondary | ICD-10-CM | POA: Diagnosis not present

## 2018-05-26 DIAGNOSIS — E1151 Type 2 diabetes mellitus with diabetic peripheral angiopathy without gangrene: Secondary | ICD-10-CM | POA: Diagnosis not present

## 2018-05-26 DIAGNOSIS — J9621 Acute and chronic respiratory failure with hypoxia: Secondary | ICD-10-CM | POA: Diagnosis not present

## 2018-05-26 DIAGNOSIS — I872 Venous insufficiency (chronic) (peripheral): Secondary | ICD-10-CM | POA: Diagnosis not present

## 2018-05-26 DIAGNOSIS — M797 Fibromyalgia: Secondary | ICD-10-CM | POA: Diagnosis not present

## 2018-05-26 DIAGNOSIS — G4733 Obstructive sleep apnea (adult) (pediatric): Secondary | ICD-10-CM | POA: Diagnosis not present

## 2018-05-26 DIAGNOSIS — N183 Chronic kidney disease, stage 3 (moderate): Secondary | ICD-10-CM | POA: Diagnosis not present

## 2018-05-26 DIAGNOSIS — J439 Emphysema, unspecified: Secondary | ICD-10-CM | POA: Diagnosis not present

## 2018-05-26 DIAGNOSIS — E1143 Type 2 diabetes mellitus with diabetic autonomic (poly)neuropathy: Secondary | ICD-10-CM | POA: Diagnosis not present

## 2018-05-26 DIAGNOSIS — I5033 Acute on chronic diastolic (congestive) heart failure: Secondary | ICD-10-CM | POA: Diagnosis not present

## 2018-05-26 DIAGNOSIS — I471 Supraventricular tachycardia: Secondary | ICD-10-CM | POA: Diagnosis not present

## 2018-05-26 NOTE — Telephone Encounter (Signed)
Paper referral received from MD Tabori for Pulmonary Rehab with diagnosis of chronic bronchitis and chronic respiratory failure with hypoxia . Clinical review of pt follow up appt on 05/02/18 primary office note with Hassell Done PA with Dr.Tabori. Pt aslso saw Pulmonologist MD Wert on 05/05/19. Pt appropriate for scheduling for Pulmonary rehab.  Will forward to support staff for scheduling and verification of insurance eligibility/benefits with pt consent.   Joycelyn Man, RN, BSN Cardiac and Pulmonary Rehab Nurse

## 2018-05-29 DIAGNOSIS — I5033 Acute on chronic diastolic (congestive) heart failure: Secondary | ICD-10-CM | POA: Diagnosis not present

## 2018-05-29 DIAGNOSIS — M1991 Primary osteoarthritis, unspecified site: Secondary | ICD-10-CM | POA: Diagnosis not present

## 2018-05-29 DIAGNOSIS — I471 Supraventricular tachycardia: Secondary | ICD-10-CM | POA: Diagnosis not present

## 2018-05-29 DIAGNOSIS — I130 Hypertensive heart and chronic kidney disease with heart failure and stage 1 through stage 4 chronic kidney disease, or unspecified chronic kidney disease: Secondary | ICD-10-CM | POA: Diagnosis not present

## 2018-05-29 DIAGNOSIS — E1140 Type 2 diabetes mellitus with diabetic neuropathy, unspecified: Secondary | ICD-10-CM | POA: Diagnosis not present

## 2018-05-29 DIAGNOSIS — G4733 Obstructive sleep apnea (adult) (pediatric): Secondary | ICD-10-CM | POA: Diagnosis not present

## 2018-05-29 DIAGNOSIS — I872 Venous insufficiency (chronic) (peripheral): Secondary | ICD-10-CM | POA: Diagnosis not present

## 2018-05-29 DIAGNOSIS — G2581 Restless legs syndrome: Secondary | ICD-10-CM | POA: Diagnosis not present

## 2018-05-29 DIAGNOSIS — K3184 Gastroparesis: Secondary | ICD-10-CM | POA: Diagnosis not present

## 2018-05-29 DIAGNOSIS — E1122 Type 2 diabetes mellitus with diabetic chronic kidney disease: Secondary | ICD-10-CM | POA: Diagnosis not present

## 2018-05-29 DIAGNOSIS — I2781 Cor pulmonale (chronic): Secondary | ICD-10-CM | POA: Diagnosis not present

## 2018-05-29 DIAGNOSIS — Z7951 Long term (current) use of inhaled steroids: Secondary | ICD-10-CM | POA: Diagnosis not present

## 2018-05-29 DIAGNOSIS — J9622 Acute and chronic respiratory failure with hypercapnia: Secondary | ICD-10-CM | POA: Diagnosis not present

## 2018-05-29 DIAGNOSIS — Z794 Long term (current) use of insulin: Secondary | ICD-10-CM | POA: Diagnosis not present

## 2018-05-29 DIAGNOSIS — Z9981 Dependence on supplemental oxygen: Secondary | ICD-10-CM | POA: Diagnosis not present

## 2018-05-29 DIAGNOSIS — N183 Chronic kidney disease, stage 3 (moderate): Secondary | ICD-10-CM | POA: Diagnosis not present

## 2018-05-29 DIAGNOSIS — E1143 Type 2 diabetes mellitus with diabetic autonomic (poly)neuropathy: Secondary | ICD-10-CM | POA: Diagnosis not present

## 2018-05-29 DIAGNOSIS — G894 Chronic pain syndrome: Secondary | ICD-10-CM | POA: Diagnosis not present

## 2018-05-29 DIAGNOSIS — J439 Emphysema, unspecified: Secondary | ICD-10-CM | POA: Diagnosis not present

## 2018-05-29 DIAGNOSIS — J9621 Acute and chronic respiratory failure with hypoxia: Secondary | ICD-10-CM | POA: Diagnosis not present

## 2018-05-29 DIAGNOSIS — M797 Fibromyalgia: Secondary | ICD-10-CM | POA: Diagnosis not present

## 2018-05-29 DIAGNOSIS — E1151 Type 2 diabetes mellitus with diabetic peripheral angiopathy without gangrene: Secondary | ICD-10-CM | POA: Diagnosis not present

## 2018-05-30 DIAGNOSIS — I5033 Acute on chronic diastolic (congestive) heart failure: Secondary | ICD-10-CM | POA: Diagnosis not present

## 2018-05-30 DIAGNOSIS — G2581 Restless legs syndrome: Secondary | ICD-10-CM | POA: Diagnosis not present

## 2018-05-30 DIAGNOSIS — K3184 Gastroparesis: Secondary | ICD-10-CM | POA: Diagnosis not present

## 2018-05-30 DIAGNOSIS — Z9981 Dependence on supplemental oxygen: Secondary | ICD-10-CM | POA: Diagnosis not present

## 2018-05-30 DIAGNOSIS — G894 Chronic pain syndrome: Secondary | ICD-10-CM | POA: Diagnosis not present

## 2018-05-30 DIAGNOSIS — J9621 Acute and chronic respiratory failure with hypoxia: Secondary | ICD-10-CM | POA: Diagnosis not present

## 2018-05-30 DIAGNOSIS — N183 Chronic kidney disease, stage 3 (moderate): Secondary | ICD-10-CM | POA: Diagnosis not present

## 2018-05-30 DIAGNOSIS — J9622 Acute and chronic respiratory failure with hypercapnia: Secondary | ICD-10-CM | POA: Diagnosis not present

## 2018-05-30 DIAGNOSIS — M797 Fibromyalgia: Secondary | ICD-10-CM | POA: Diagnosis not present

## 2018-05-30 DIAGNOSIS — G4733 Obstructive sleep apnea (adult) (pediatric): Secondary | ICD-10-CM | POA: Diagnosis not present

## 2018-05-30 DIAGNOSIS — E1122 Type 2 diabetes mellitus with diabetic chronic kidney disease: Secondary | ICD-10-CM | POA: Diagnosis not present

## 2018-05-30 DIAGNOSIS — I471 Supraventricular tachycardia: Secondary | ICD-10-CM | POA: Diagnosis not present

## 2018-05-30 DIAGNOSIS — I130 Hypertensive heart and chronic kidney disease with heart failure and stage 1 through stage 4 chronic kidney disease, or unspecified chronic kidney disease: Secondary | ICD-10-CM | POA: Diagnosis not present

## 2018-05-30 DIAGNOSIS — J439 Emphysema, unspecified: Secondary | ICD-10-CM | POA: Diagnosis not present

## 2018-05-30 DIAGNOSIS — Z794 Long term (current) use of insulin: Secondary | ICD-10-CM | POA: Diagnosis not present

## 2018-05-30 DIAGNOSIS — M1991 Primary osteoarthritis, unspecified site: Secondary | ICD-10-CM | POA: Diagnosis not present

## 2018-05-30 DIAGNOSIS — I872 Venous insufficiency (chronic) (peripheral): Secondary | ICD-10-CM | POA: Diagnosis not present

## 2018-05-30 DIAGNOSIS — E1140 Type 2 diabetes mellitus with diabetic neuropathy, unspecified: Secondary | ICD-10-CM | POA: Diagnosis not present

## 2018-05-30 DIAGNOSIS — Z7951 Long term (current) use of inhaled steroids: Secondary | ICD-10-CM | POA: Diagnosis not present

## 2018-05-30 DIAGNOSIS — I2781 Cor pulmonale (chronic): Secondary | ICD-10-CM | POA: Diagnosis not present

## 2018-05-30 DIAGNOSIS — E1143 Type 2 diabetes mellitus with diabetic autonomic (poly)neuropathy: Secondary | ICD-10-CM | POA: Diagnosis not present

## 2018-05-30 DIAGNOSIS — E1151 Type 2 diabetes mellitus with diabetic peripheral angiopathy without gangrene: Secondary | ICD-10-CM | POA: Diagnosis not present

## 2018-05-31 ENCOUNTER — Ambulatory Visit: Payer: Self-pay

## 2018-05-31 DIAGNOSIS — J439 Emphysema, unspecified: Secondary | ICD-10-CM | POA: Diagnosis not present

## 2018-05-31 DIAGNOSIS — Z9981 Dependence on supplemental oxygen: Secondary | ICD-10-CM | POA: Diagnosis not present

## 2018-05-31 DIAGNOSIS — I2781 Cor pulmonale (chronic): Secondary | ICD-10-CM | POA: Diagnosis not present

## 2018-05-31 DIAGNOSIS — G4733 Obstructive sleep apnea (adult) (pediatric): Secondary | ICD-10-CM | POA: Diagnosis not present

## 2018-05-31 DIAGNOSIS — G894 Chronic pain syndrome: Secondary | ICD-10-CM | POA: Diagnosis not present

## 2018-05-31 DIAGNOSIS — E1151 Type 2 diabetes mellitus with diabetic peripheral angiopathy without gangrene: Secondary | ICD-10-CM | POA: Diagnosis not present

## 2018-05-31 DIAGNOSIS — I130 Hypertensive heart and chronic kidney disease with heart failure and stage 1 through stage 4 chronic kidney disease, or unspecified chronic kidney disease: Secondary | ICD-10-CM | POA: Diagnosis not present

## 2018-05-31 DIAGNOSIS — E1143 Type 2 diabetes mellitus with diabetic autonomic (poly)neuropathy: Secondary | ICD-10-CM | POA: Diagnosis not present

## 2018-05-31 DIAGNOSIS — J9621 Acute and chronic respiratory failure with hypoxia: Secondary | ICD-10-CM | POA: Diagnosis not present

## 2018-05-31 DIAGNOSIS — I471 Supraventricular tachycardia: Secondary | ICD-10-CM | POA: Diagnosis not present

## 2018-05-31 DIAGNOSIS — J9622 Acute and chronic respiratory failure with hypercapnia: Secondary | ICD-10-CM | POA: Diagnosis not present

## 2018-05-31 DIAGNOSIS — E1140 Type 2 diabetes mellitus with diabetic neuropathy, unspecified: Secondary | ICD-10-CM | POA: Diagnosis not present

## 2018-05-31 DIAGNOSIS — M797 Fibromyalgia: Secondary | ICD-10-CM | POA: Diagnosis not present

## 2018-05-31 DIAGNOSIS — Z794 Long term (current) use of insulin: Secondary | ICD-10-CM | POA: Diagnosis not present

## 2018-05-31 DIAGNOSIS — I872 Venous insufficiency (chronic) (peripheral): Secondary | ICD-10-CM | POA: Diagnosis not present

## 2018-05-31 DIAGNOSIS — N183 Chronic kidney disease, stage 3 (moderate): Secondary | ICD-10-CM | POA: Diagnosis not present

## 2018-05-31 DIAGNOSIS — M1991 Primary osteoarthritis, unspecified site: Secondary | ICD-10-CM | POA: Diagnosis not present

## 2018-05-31 DIAGNOSIS — Z7951 Long term (current) use of inhaled steroids: Secondary | ICD-10-CM | POA: Diagnosis not present

## 2018-05-31 DIAGNOSIS — K3184 Gastroparesis: Secondary | ICD-10-CM | POA: Diagnosis not present

## 2018-05-31 DIAGNOSIS — I5033 Acute on chronic diastolic (congestive) heart failure: Secondary | ICD-10-CM | POA: Diagnosis not present

## 2018-05-31 DIAGNOSIS — G2581 Restless legs syndrome: Secondary | ICD-10-CM | POA: Diagnosis not present

## 2018-05-31 DIAGNOSIS — E1122 Type 2 diabetes mellitus with diabetic chronic kidney disease: Secondary | ICD-10-CM | POA: Diagnosis not present

## 2018-05-31 NOTE — Telephone Encounter (Signed)
Tom, Physical Therapist with [facility] called to let the doctor know that her blood pressure this morning is 158/64 and that is outside there perimeters so they have to let the office know. She took her blood pressure medication last night. She had a nose bleed this morning along with a headache. BP retaken and it was 156/64. Pt stated she is having a mild headache 2-3  and  Her nose bled for 10 minutes but pt stated it was mild. Called PCP office and triage information given to provider. [name] stated to call pt and inform pt to retake her BP in an hour and monitor headache. If BP elevated and headache still present to call back and make an appointment. Reason for Disposition . [6] Systolic BP  >= 773 OR Diastolic >= 80 AND [7] taking BP medications  Answer Assessment - Initial Assessment Questions 1. BLOOD PRESSURE: "What is the blood pressure?" "Did you take at least two measurements 5 minutes apart?"     158/64   156/64 2. ONSET: "When did you take your blood pressure?"    Before call and during call 3. HOW: "How did you obtain the blood pressure?" (e.g., visiting nurse, automatic home BP monitor)     manual 4. HISTORY: "Do you have a history of high blood pressure?"     yes 5. MEDICATIONS: "Are you taking any medications for blood pressure?" "Have you missed any doses recently?"     Yes- no 6. OTHER SYMPTOMS: "Do you have any symptoms?" (e.g., headache, chest pain, blurred vision, difficulty breathing, weakness)     Headache 2-3, nose bleed small amount 10 minutes 7. PREGNANCY: "Is there any chance you are pregnant?" "When was your last menstrual period?"     n/a  Protocols used: HIGH BLOOD PRESSURE-A-AH

## 2018-06-01 ENCOUNTER — Telehealth (HOSPITAL_COMMUNITY): Payer: Self-pay

## 2018-06-01 NOTE — Telephone Encounter (Signed)
Called and spoke with pt in regards to PR, pt stated she is currently receiving HHPT. But will like to participate in PR. Adv pt she would need to 1st complete HHPT before starting PR, she stated she understood. Went over insurance, patient verbalized understanding.

## 2018-06-01 NOTE — Telephone Encounter (Signed)
Pt insurance is active and benefits verified through [facility]. Co-pay $20.00, DED $0.00/$0.00 met, out of pocket $3,600.00/$22.14 met, co-insurance 0%. No pre-authorization required. Rachel/[employer], 06/01/2018 @ 10:57AM, REF# [id number]

## 2018-06-02 ENCOUNTER — Other Ambulatory Visit: Payer: Self-pay | Admitting: Emergency Medicine

## 2018-06-02 DIAGNOSIS — M797 Fibromyalgia: Secondary | ICD-10-CM | POA: Diagnosis not present

## 2018-06-02 DIAGNOSIS — I872 Venous insufficiency (chronic) (peripheral): Secondary | ICD-10-CM | POA: Diagnosis not present

## 2018-06-02 DIAGNOSIS — J9622 Acute and chronic respiratory failure with hypercapnia: Secondary | ICD-10-CM | POA: Diagnosis not present

## 2018-06-02 DIAGNOSIS — I2781 Cor pulmonale (chronic): Secondary | ICD-10-CM | POA: Diagnosis not present

## 2018-06-02 DIAGNOSIS — E1143 Type 2 diabetes mellitus with diabetic autonomic (poly)neuropathy: Secondary | ICD-10-CM | POA: Diagnosis not present

## 2018-06-02 DIAGNOSIS — J439 Emphysema, unspecified: Secondary | ICD-10-CM | POA: Diagnosis not present

## 2018-06-02 DIAGNOSIS — G4733 Obstructive sleep apnea (adult) (pediatric): Secondary | ICD-10-CM | POA: Diagnosis not present

## 2018-06-02 DIAGNOSIS — Z9981 Dependence on supplemental oxygen: Secondary | ICD-10-CM | POA: Diagnosis not present

## 2018-06-02 DIAGNOSIS — J9621 Acute and chronic respiratory failure with hypoxia: Secondary | ICD-10-CM | POA: Diagnosis not present

## 2018-06-02 DIAGNOSIS — E1122 Type 2 diabetes mellitus with diabetic chronic kidney disease: Secondary | ICD-10-CM | POA: Diagnosis not present

## 2018-06-02 DIAGNOSIS — M1991 Primary osteoarthritis, unspecified site: Secondary | ICD-10-CM | POA: Diagnosis not present

## 2018-06-02 DIAGNOSIS — K3184 Gastroparesis: Secondary | ICD-10-CM | POA: Diagnosis not present

## 2018-06-02 DIAGNOSIS — Z7951 Long term (current) use of inhaled steroids: Secondary | ICD-10-CM | POA: Diagnosis not present

## 2018-06-02 DIAGNOSIS — N183 Chronic kidney disease, stage 3 (moderate): Secondary | ICD-10-CM | POA: Diagnosis not present

## 2018-06-02 DIAGNOSIS — I471 Supraventricular tachycardia: Secondary | ICD-10-CM | POA: Diagnosis not present

## 2018-06-02 DIAGNOSIS — E1140 Type 2 diabetes mellitus with diabetic neuropathy, unspecified: Secondary | ICD-10-CM | POA: Diagnosis not present

## 2018-06-02 DIAGNOSIS — E1151 Type 2 diabetes mellitus with diabetic peripheral angiopathy without gangrene: Secondary | ICD-10-CM | POA: Diagnosis not present

## 2018-06-02 DIAGNOSIS — I5033 Acute on chronic diastolic (congestive) heart failure: Secondary | ICD-10-CM | POA: Diagnosis not present

## 2018-06-02 DIAGNOSIS — Z794 Long term (current) use of insulin: Secondary | ICD-10-CM | POA: Diagnosis not present

## 2018-06-02 DIAGNOSIS — G894 Chronic pain syndrome: Secondary | ICD-10-CM | POA: Diagnosis not present

## 2018-06-02 DIAGNOSIS — J449 Chronic obstructive pulmonary disease, unspecified: Secondary | ICD-10-CM | POA: Diagnosis not present

## 2018-06-02 DIAGNOSIS — G2581 Restless legs syndrome: Secondary | ICD-10-CM | POA: Diagnosis not present

## 2018-06-02 DIAGNOSIS — I130 Hypertensive heart and chronic kidney disease with heart failure and stage 1 through stage 4 chronic kidney disease, or unspecified chronic kidney disease: Secondary | ICD-10-CM | POA: Diagnosis not present

## 2018-06-02 MED ORDER — DILTIAZEM HCL ER COATED BEADS 180 MG PO CP24: 180.0000 mg | ORAL_CAPSULE | Freq: Every day | ORAL | 1 refills | Status: AC

## 2018-06-05 DIAGNOSIS — E1122 Type 2 diabetes mellitus with diabetic chronic kidney disease: Secondary | ICD-10-CM | POA: Diagnosis not present

## 2018-06-05 DIAGNOSIS — E1140 Type 2 diabetes mellitus with diabetic neuropathy, unspecified: Secondary | ICD-10-CM | POA: Diagnosis not present

## 2018-06-05 DIAGNOSIS — N183 Chronic kidney disease, stage 3 (moderate): Secondary | ICD-10-CM | POA: Diagnosis not present

## 2018-06-05 DIAGNOSIS — I471 Supraventricular tachycardia: Secondary | ICD-10-CM | POA: Diagnosis not present

## 2018-06-05 DIAGNOSIS — Z7951 Long term (current) use of inhaled steroids: Secondary | ICD-10-CM | POA: Diagnosis not present

## 2018-06-05 DIAGNOSIS — J9622 Acute and chronic respiratory failure with hypercapnia: Secondary | ICD-10-CM | POA: Diagnosis not present

## 2018-06-05 DIAGNOSIS — Z794 Long term (current) use of insulin: Secondary | ICD-10-CM | POA: Diagnosis not present

## 2018-06-05 DIAGNOSIS — M797 Fibromyalgia: Secondary | ICD-10-CM | POA: Diagnosis not present

## 2018-06-05 DIAGNOSIS — G4733 Obstructive sleep apnea (adult) (pediatric): Secondary | ICD-10-CM | POA: Diagnosis not present

## 2018-06-05 DIAGNOSIS — M1991 Primary osteoarthritis, unspecified site: Secondary | ICD-10-CM | POA: Diagnosis not present

## 2018-06-05 DIAGNOSIS — I130 Hypertensive heart and chronic kidney disease with heart failure and stage 1 through stage 4 chronic kidney disease, or unspecified chronic kidney disease: Secondary | ICD-10-CM | POA: Diagnosis not present

## 2018-06-05 DIAGNOSIS — E1151 Type 2 diabetes mellitus with diabetic peripheral angiopathy without gangrene: Secondary | ICD-10-CM | POA: Diagnosis not present

## 2018-06-05 DIAGNOSIS — I5033 Acute on chronic diastolic (congestive) heart failure: Secondary | ICD-10-CM | POA: Diagnosis not present

## 2018-06-05 DIAGNOSIS — J9621 Acute and chronic respiratory failure with hypoxia: Secondary | ICD-10-CM | POA: Diagnosis not present

## 2018-06-05 DIAGNOSIS — J439 Emphysema, unspecified: Secondary | ICD-10-CM | POA: Diagnosis not present

## 2018-06-05 DIAGNOSIS — I872 Venous insufficiency (chronic) (peripheral): Secondary | ICD-10-CM | POA: Diagnosis not present

## 2018-06-05 DIAGNOSIS — I2781 Cor pulmonale (chronic): Secondary | ICD-10-CM | POA: Diagnosis not present

## 2018-06-05 DIAGNOSIS — K3184 Gastroparesis: Secondary | ICD-10-CM | POA: Diagnosis not present

## 2018-06-05 DIAGNOSIS — Z9981 Dependence on supplemental oxygen: Secondary | ICD-10-CM | POA: Diagnosis not present

## 2018-06-05 DIAGNOSIS — G2581 Restless legs syndrome: Secondary | ICD-10-CM | POA: Diagnosis not present

## 2018-06-05 DIAGNOSIS — G894 Chronic pain syndrome: Secondary | ICD-10-CM | POA: Diagnosis not present

## 2018-06-05 DIAGNOSIS — E1143 Type 2 diabetes mellitus with diabetic autonomic (poly)neuropathy: Secondary | ICD-10-CM | POA: Diagnosis not present

## 2018-06-08 ENCOUNTER — Emergency Department (HOSPITAL_COMMUNITY): Payer: Medicare Other

## 2018-06-08 ENCOUNTER — Encounter (HOSPITAL_COMMUNITY): Payer: Self-pay

## 2018-06-08 ENCOUNTER — Inpatient Hospital Stay (HOSPITAL_COMMUNITY)
Admission: EM | Admit: 2018-06-08 | Discharge: 2018-06-15 | DRG: 190 | Disposition: A | Discharge status: Home Health | Payer: Medicare Other | Attending: Internal Medicine | Admitting: Internal Medicine

## 2018-06-08 ENCOUNTER — Telehealth: Payer: Self-pay | Admitting: Physician Assistant

## 2018-06-08 ENCOUNTER — Other Ambulatory Visit: Payer: Self-pay

## 2018-06-08 DIAGNOSIS — N183 Chronic kidney disease, stage 3 unspecified: Secondary | ICD-10-CM | POA: Diagnosis present

## 2018-06-08 DIAGNOSIS — Z905 Acquired absence of kidney: Secondary | ICD-10-CM

## 2018-06-08 DIAGNOSIS — D696 Thrombocytopenia, unspecified: Secondary | ICD-10-CM | POA: Diagnosis not present

## 2018-06-08 DIAGNOSIS — Z888 Allergy status to other drugs, medicaments and biological substances status: Secondary | ICD-10-CM

## 2018-06-08 DIAGNOSIS — E1129 Type 2 diabetes mellitus with other diabetic kidney complication: Secondary | ICD-10-CM | POA: Diagnosis present

## 2018-06-08 DIAGNOSIS — E1165 Type 2 diabetes mellitus with hyperglycemia: Secondary | ICD-10-CM | POA: Diagnosis not present

## 2018-06-08 DIAGNOSIS — E1122 Type 2 diabetes mellitus with diabetic chronic kidney disease: Secondary | ICD-10-CM | POA: Diagnosis not present

## 2018-06-08 DIAGNOSIS — G4733 Obstructive sleep apnea (adult) (pediatric): Secondary | ICD-10-CM | POA: Diagnosis not present

## 2018-06-08 DIAGNOSIS — I872 Venous insufficiency (chronic) (peripheral): Secondary | ICD-10-CM | POA: Diagnosis not present

## 2018-06-08 DIAGNOSIS — Z90710 Acquired absence of both cervix and uterus: Secondary | ICD-10-CM

## 2018-06-08 DIAGNOSIS — J9621 Acute and chronic respiratory failure with hypoxia: Secondary | ICD-10-CM

## 2018-06-08 DIAGNOSIS — I5033 Acute on chronic diastolic (congestive) heart failure: Secondary | ICD-10-CM | POA: Diagnosis not present

## 2018-06-08 DIAGNOSIS — Z7951 Long term (current) use of inhaled steroids: Secondary | ICD-10-CM | POA: Diagnosis not present

## 2018-06-08 DIAGNOSIS — Z833 Family history of diabetes mellitus: Secondary | ICD-10-CM

## 2018-06-08 DIAGNOSIS — I130 Hypertensive heart and chronic kidney disease with heart failure and stage 1 through stage 4 chronic kidney disease, or unspecified chronic kidney disease: Secondary | ICD-10-CM | POA: Diagnosis present

## 2018-06-08 DIAGNOSIS — M1991 Primary osteoarthritis, unspecified site: Secondary | ICD-10-CM | POA: Diagnosis not present

## 2018-06-08 DIAGNOSIS — F32A Depression, unspecified: Secondary | ICD-10-CM | POA: Diagnosis present

## 2018-06-08 DIAGNOSIS — J9622 Acute and chronic respiratory failure with hypercapnia: Secondary | ICD-10-CM | POA: Diagnosis not present

## 2018-06-08 DIAGNOSIS — Z825 Family history of asthma and other chronic lower respiratory diseases: Secondary | ICD-10-CM

## 2018-06-08 DIAGNOSIS — Z85528 Personal history of other malignant neoplasm of kidney: Secondary | ICD-10-CM

## 2018-06-08 DIAGNOSIS — Z886 Allergy status to analgesic agent status: Secondary | ICD-10-CM

## 2018-06-08 DIAGNOSIS — G2581 Restless legs syndrome: Secondary | ICD-10-CM | POA: Diagnosis present

## 2018-06-08 DIAGNOSIS — J441 Chronic obstructive pulmonary disease with (acute) exacerbation: Principal | ICD-10-CM | POA: Diagnosis present

## 2018-06-08 DIAGNOSIS — R0602 Shortness of breath: Secondary | ICD-10-CM | POA: Diagnosis not present

## 2018-06-08 DIAGNOSIS — F329 Major depressive disorder, single episode, unspecified: Secondary | ICD-10-CM | POA: Diagnosis present

## 2018-06-08 DIAGNOSIS — E1151 Type 2 diabetes mellitus with diabetic peripheral angiopathy without gangrene: Secondary | ICD-10-CM | POA: Diagnosis not present

## 2018-06-08 DIAGNOSIS — E039 Hypothyroidism, unspecified: Secondary | ICD-10-CM | POA: Diagnosis not present

## 2018-06-08 DIAGNOSIS — K3184 Gastroparesis: Secondary | ICD-10-CM | POA: Diagnosis not present

## 2018-06-08 DIAGNOSIS — M1990 Unspecified osteoarthritis, unspecified site: Secondary | ICD-10-CM | POA: Diagnosis present

## 2018-06-08 DIAGNOSIS — I471 Supraventricular tachycardia: Secondary | ICD-10-CM | POA: Diagnosis not present

## 2018-06-08 DIAGNOSIS — T380X5A Adverse effect of glucocorticoids and synthetic analogues, initial encounter: Secondary | ICD-10-CM | POA: Diagnosis not present

## 2018-06-08 DIAGNOSIS — E1140 Type 2 diabetes mellitus with diabetic neuropathy, unspecified: Secondary | ICD-10-CM | POA: Diagnosis not present

## 2018-06-08 DIAGNOSIS — I10 Essential (primary) hypertension: Secondary | ICD-10-CM | POA: Diagnosis present

## 2018-06-08 DIAGNOSIS — E785 Hyperlipidemia, unspecified: Secondary | ICD-10-CM | POA: Diagnosis present

## 2018-06-08 DIAGNOSIS — J439 Emphysema, unspecified: Secondary | ICD-10-CM | POA: Diagnosis not present

## 2018-06-08 DIAGNOSIS — Z6841 Body Mass Index (BMI) 40.0 and over, adult: Secondary | ICD-10-CM

## 2018-06-08 DIAGNOSIS — G8929 Other chronic pain: Secondary | ICD-10-CM | POA: Diagnosis present

## 2018-06-08 DIAGNOSIS — I2781 Cor pulmonale (chronic): Secondary | ICD-10-CM | POA: Diagnosis not present

## 2018-06-08 DIAGNOSIS — Z9981 Dependence on supplemental oxygen: Secondary | ICD-10-CM

## 2018-06-08 DIAGNOSIS — G5600 Carpal tunnel syndrome, unspecified upper limb: Secondary | ICD-10-CM | POA: Diagnosis present

## 2018-06-08 DIAGNOSIS — R0989 Other specified symptoms and signs involving the circulatory and respiratory systems: Secondary | ICD-10-CM | POA: Diagnosis not present

## 2018-06-08 DIAGNOSIS — Z8249 Family history of ischemic heart disease and other diseases of the circulatory system: Secondary | ICD-10-CM

## 2018-06-08 DIAGNOSIS — E1142 Type 2 diabetes mellitus with diabetic polyneuropathy: Secondary | ICD-10-CM | POA: Diagnosis present

## 2018-06-08 DIAGNOSIS — Z794 Long term (current) use of insulin: Secondary | ICD-10-CM | POA: Diagnosis not present

## 2018-06-08 DIAGNOSIS — Q600 Renal agenesis, unilateral: Secondary | ICD-10-CM

## 2018-06-08 DIAGNOSIS — Z8619 Personal history of other infectious and parasitic diseases: Secondary | ICD-10-CM

## 2018-06-08 DIAGNOSIS — I878 Other specified disorders of veins: Secondary | ICD-10-CM | POA: Diagnosis present

## 2018-06-08 DIAGNOSIS — I472 Ventricular tachycardia: Secondary | ICD-10-CM | POA: Diagnosis not present

## 2018-06-08 DIAGNOSIS — M797 Fibromyalgia: Secondary | ICD-10-CM | POA: Diagnosis not present

## 2018-06-08 DIAGNOSIS — N179 Acute kidney failure, unspecified: Secondary | ICD-10-CM | POA: Diagnosis present

## 2018-06-08 DIAGNOSIS — E1143 Type 2 diabetes mellitus with diabetic autonomic (poly)neuropathy: Secondary | ICD-10-CM | POA: Diagnosis not present

## 2018-06-08 DIAGNOSIS — Z79890 Hormone replacement therapy (postmenopausal): Secondary | ICD-10-CM

## 2018-06-08 DIAGNOSIS — Z79891 Long term (current) use of opiate analgesic: Secondary | ICD-10-CM

## 2018-06-08 DIAGNOSIS — Z87891 Personal history of nicotine dependence: Secondary | ICD-10-CM

## 2018-06-08 DIAGNOSIS — G894 Chronic pain syndrome: Secondary | ICD-10-CM | POA: Diagnosis not present

## 2018-06-08 DIAGNOSIS — Z79899 Other long term (current) drug therapy: Secondary | ICD-10-CM

## 2018-06-08 LAB — I-STAT TROPONIN, ED: TROPONIN I, POC: 0.01 ng/mL (ref 0.00–0.08)

## 2018-06-08 LAB — BASIC METABOLIC PANEL
ANION GAP: 7 (ref 5–15)
BUN: 29 mg/dL — ABNORMAL HIGH (ref 8–23)
CALCIUM: 8.9 mg/dL (ref 8.9–10.3)
CO2: 38 mmol/L — AB (ref 22–32)
CREATININE: 1.57 mg/dL — AB (ref 0.44–1.00)
Chloride: 94 mmol/L — ABNORMAL LOW (ref 98–111)
GFR calc non Af Amer: 34 mL/min — ABNORMAL LOW (ref >60)
GFR, EST AFRICAN AMERICAN: 40 mL/min — AB (ref >60)
Glucose, Bld: 121 mg/dL — ABNORMAL HIGH (ref 70–99)
Potassium: 4.2 mmol/L (ref 3.5–5.1)
SODIUM: 139 mmol/L (ref 135–145)

## 2018-06-08 LAB — BRAIN NATRIURETIC PEPTIDE: B NATRIURETIC PEPTIDE 5: 326.6 pg/mL — AB (ref 0.0–100.0)

## 2018-06-08 MED ORDER — IPRATROPIUM-ALBUTEROL 0.5-2.5 (3) MG/3ML IN SOLN
3.0000 mL | Freq: Once | RESPIRATORY_TRACT | Status: AC
  Administered 2018-06-08: 3 mL via RESPIRATORY_TRACT
  Filled 2018-06-08: qty 3

## 2018-06-08 MED ORDER — ALBUTEROL (5 MG/ML) CONTINUOUS INHALATION SOLN
10.0000 mg/h | INHALATION_SOLUTION | Freq: Once | RESPIRATORY_TRACT | Status: AC
  Administered 2018-06-08: 10 mg/h via RESPIRATORY_TRACT
  Filled 2018-06-08: qty 20

## 2018-06-08 MED ORDER — PREDNISONE 20 MG PO TABS: 60.0000 mg | ORAL_TABLET | Freq: Once | ORAL | Status: DC

## 2018-06-08 NOTE — Telephone Encounter (Signed)
Copied from [place] [id number]. Topic: Quick Communication - See Telephone Encounter >> Jun 08, 2018 11:40 AM Ahmed Prima L wrote: CRM for notification. See Telephone encounter for: 06/08/18.  Tom, physical therapist with [facility] called and said he is at her home and they walked for a couple minutes. Her O2 stats were dropping down into the 70's and she is on 3 liters of Oxygen. She is sitting down now and her O2 stats are in the 90's Call back if needed [phone]

## 2018-06-08 NOTE — Telephone Encounter (Signed)
She needs ER assessment if they are dropping that significantly. If nothing else she needs to call and see her Pulm specialist today.

## 2018-06-08 NOTE — ED Triage Notes (Signed)
Pt is alert and oriented x 4 and is verbally responsive. Pt reports that she has been having low O2 saturations in the low 80's. Pt has Hx of COPD, pt is able to talk in complete sentences and is breathing unlabored at this time on 4 lpm via [place] of O2 therapy. Pt sats are currently 97% in triage. Pt reports that's she was dx with CHF in Dec. Pt has LE edema.

## 2018-06-08 NOTE — H&P (Addendum)
History and Physical    Renee Noble DOB: June 18, 1955 DOA: 06/08/2018  Referring MD/NP/PA:   PCP: [name], PA-C   Patient coming from:  The patient is coming from home.  At baseline, pt is independent for most of ADL.        Chief Complaint: SOB  HPI: Renee Noble is a 63 y.o. female with medical history significant of solitary kidney, CKD stage III, hypertension, hyperlipidemia, diabetes mellitus, COPD on 3 L oxygen at home, CHF, HCV, OSA on CPAP, fibromyalgia, chronic back pain and neck pain, depression, hypothyroidism, who presents with shortness of breath.  Patient states that she has been having shortness of breath in the past 2 days, which has been progressively getting worse.  She has oxygen desaturation to 70s-80s at home.  Patient has dry cough, no chest pain, fever or chills.  She has nausea, but no vomiting, diarrhea or abdominal pain.  No symptoms of UTI.  She has bilateral leg edema.  No unilateral weakness.  ED Course: pt was found to have BNP is 326.6, pending CBC, negative troponin, renal function close to baseline, temperature normal, heart rate in 90s, oxygen saturation 95% on 3 L nasal cannula oxygen.  Chest x-ray showed cardiomegaly and vascular congestion.  Patient is placed on telemetry bed of observation.  Review of Systems:   General: no fevers, chills, has fatigue HEENT: no blurry vision, hearing changes or sore throat Respiratory: has dyspnea, coughing, wheezing CV: no chest pain, no palpitations GI: has nausea, no vomiting, abdominal pain, diarrhea, constipation GU: no dysuria, burning on urination, increased urinary frequency, hematuria  Ext: has leg edema Neuro: no unilateral weakness, numbness, or tingling, no vision change or hearing loss Skin: no rash, no skin tear. MSK: No muscle spasm, no deformity, no limitation of range of movement in spin Heme: No easy bruising.  Travel history: No recent long distant travel.  Allergy:    Allergies  Allergen Reactions  . Gabapentin Anaphylaxis  . Lyrica [Pregabalin] Shortness Of Breath    Trouble breathing  . Ketorolac Tromethamine Hives  . Lisinopril Cough    Past Medical History:  Diagnosis Date  . Abdominal mass of other site   . Cervical compression fracture ([place])   . CHF (congestive heart failure) ([place])   . Chronic kidney disease, stage 3 (HCC)    Borderline Stage 2-3  . Chronic lower limb pain   . COPD (chronic obstructive pulmonary disease) ([place])   . CTS (carpal tunnel syndrome)   . Depression   . Diabetes ([place])    Type II  . Fibromyalgia   . Hepatitis C    cured last year (2018)  . Hypercholesteremia   . Hypertension   . Hypothyroidism   . Morbid obesity ([place])   . Neuropathy    Diabetes  . OSA (obstructive sleep apnea)   . Osteoarthritis   . Renal cancer ([place])    Left Kidney Removed  . RLS (restless legs syndrome)   . Syncope   . Venous stasis     Past Surgical History:  Procedure Laterality Date  . ABDOMINAL HERNIA REPAIR     x2  . ABDOMINAL HYSTERECTOMY    . BLADDER SUSPENSION    . CHOLECYSTECTOMY    . CYST EXCISION     Head  . INCISIONAL HERNIA REPAIR    . KNEE ARTHROSCOPY     Bilateral  . NEPHRECTOMY     Left  . TONSILLECTOMY    . TOOTH  EXTRACTION    . UMBILICAL HERNIA REPAIR    . VAGINA SURGERY    . WISDOM TOOTH EXTRACTION      Social History:  reports that she quit smoking about 22 months ago. Her smoking use included cigarettes. She has a 112.50 pack-year smoking history. She has never used smokeless tobacco. She reports that she does not drink alcohol or use drugs.  Family History:  Family History  Problem Relation Age of Onset  . Heart attack Mother 53       Deceased  . Heart disease Mother   . Emphysema Mother   . Alcoholism Mother   . COPD Father 42       Deceased  . Emphysema Father   . Alcoholism Father   . Esophageal varices Father   . Alcoholism Paternal Grandfather   . Diabetes Maternal Grandmother    . Heart disease Maternal Grandmother   . Lung cancer Maternal Grandfather   . Emphysema Maternal Grandfather   . Brain cancer Maternal Aunt   . Diabetes Sister   . Heart defect Sister   . Cancer Sister        was told her sister had cancer but beat it and dont know which one   . Heart defect Sister   . Obesity Son   . Breast cancer Maternal Aunt   . Colon cancer Neg Hx   . Esophageal cancer Neg Hx      Prior to Admission medications   Medication Sig Start Date End Date Taking? Authorizing Provider  albuterol (PROAIR HFA) 108 (90 Base) MCG/ACT inhaler Inhale 1-2 puffs into the lungs every 6 (six) hours as needed for wheezing or shortness of breath. 01/20/18  Yes Tanda Rockers, MD  budesonide-formoterol (SYMBICORT) 160-4.5 MCG/ACT inhaler Inhale 2 puffs into the lungs 2 (two) times daily. 05/04/18  Yes Tanda Rockers, MD  clotrimazole-betamethasone (LOTRISONE) cream Apply 1 application topically 2 (two) times daily as needed. Patient taking differently: Apply 1 application topically 2 (two) times daily as needed (yeast).  05/02/18  Yes [name], PA-C  diltiazem (CARDIZEM CD) 180 MG 24 hr capsule Take 1 capsule (180 mg total) by mouth daily. 06/02/18  Yes [name], PA-C  HUMALOG 100 UNIT/ML injection INJECT 0.15 ML (15 UNITS) INTO THE SKIN 3 TIMES DAILY AS NEEDED FOR HIGH BLOOD SUGAR Patient taking differently: Inject 15 Units into the skin 3 (three) times daily as needed for high blood sugar. Uses sliding scale. Sugar over 200 will give insulin. 12/29/17  Yes [name], PA-C  insulin glargine (LANTUS) 100 UNIT/ML injection INJECT 0.3MLS (30 UNITS TOTAL) INTO THE SKIN EVERY DAY Patient taking differently: Inject 30 Units into the skin every morning. INJECT 0.3MLS (30 UNITS TOTAL) INTO THE SKIN EVERY DAY 01/24/18  Yes [name], PA-C  ipratropium-albuterol (DUONEB) 0.5-2.5 (3) MG/3ML SOLN Take 3 mLs by nebulization 4 (four) times daily. Dx: J44.9 03/21/18  Yes  Parrett, [name] S, NP  levothyroxine (SYNTHROID, LEVOTHROID) 88 MCG tablet Take 1 tablet (88 mcg total) by mouth daily before breakfast. Patient taking differently: Take 88 mcg by mouth at bedtime.  12/08/17  Yes [name], PA-C  losartan (COZAAR) 25 MG tablet Take 25 mg by mouth daily.   Yes [provider]  metoCLOPramide (REGLAN) 10 MG tablet Take 1 tablet (10 mg total) by mouth every 6 (six) hours as needed for nausea. Patient taking differently: Take 10 mg by mouth 2 (two) times daily.  01/17/18  Yes Cirigliano, Vito V, DO  morphine (MS CONTIN) 30 MG 12 hr tablet Take 30 mg by mouth every 12 (twelve) hours. 03/23/18  Yes [provider]  Multiple Vitamins-Minerals (CENTRUM SILVER PO) Take by mouth.   Yes [provider]  nortriptyline (PAMELOR) 25 MG capsule Take 1 capsule (25 mg total) by mouth at bedtime. 03/08/18  Yes [name], PA-C  nystatin (MYCOSTATIN/NYSTOP) powder APPLY TOPICALLY TWICE DAILY AS NEEDED FOR YEAST INFECTION Patient taking differently: Apply 1 g topically 2 (two) times daily as needed (yeast).  09/27/17  Yes [name], PA-C  OXYGEN O2 3L at home.   Yes [provider]  pravastatin (PRAVACHOL) 20 MG tablet TAKE ONE TABLET BY MOUTH EVERY DAY 05/05/18  Yes [name], PA-C  Probiotic Product (PROBIOTIC PO) Take by mouth.   Yes [provider]  tiZANidine (ZANAFLEX) 2 MG tablet Take 2-4 mg by mouth every 12 (twelve) hours as needed for muscle spasms.   Yes [provider]  torsemide (DEMADEX) 20 MG tablet Take 1 tablet (20 mg total) by mouth daily. 04/12/18  Yes Tat, Shanon Brow, MD  benzonatate (TESSALON) 200 MG capsule Take 1 capsule (200 mg total) by mouth 3 (three) times daily as needed for cough. Patient not taking: Reported on 06/08/2018 05/16/18   [name], PA-C  doxycycline (VIBRAMYCIN) 100 MG capsule Take 1 capsule (100 mg total) by mouth 2 (two) times daily. Patient not taking:  Reported on 06/08/2018 05/16/18   [name], PA-C  glucose blood (GLUCOSE METER TEST) test strip AccuCheck Inform 2 stips Check blood sugars twice daily Dx E11.9 05/02/18   [name], PA-C  guaiFENesin (MUCINEX) 600 MG 12 hr tablet Take 1 tablet (600 mg total) by mouth 2 (two) times daily. Patient not taking: Reported on 06/08/2018 03/02/18   Mariel Aloe, MD  predniSONE (DELTASONE) 20 MG tablet Take 2 tablets (40 mg total) by mouth daily with breakfast. Patient not taking: Reported on 06/08/2018 05/16/18   [name], PA-C  SURE COMFORT INSULIN SYRINGE 30G X 1/2" 1 ML MISC USE FOUR (4) TIMES DAILY AS DIRECTED Patient taking differently: Inject 1 Syringe into the skin 4 (four) times daily.  08/31/16   [name], PA-C  varenicline (CHANTIX PAK) 0.5 MG X 11 & 1 MG X 42 tablet Take one 0.5 mg tablet by mouth QD x 3 days, then increase to one 0.5 mg tablet BID x 4 days, then increase to one 1 mg tablet BID. Patient not taking: Reported on 06/08/2018 05/02/18   [name], [place]    Physical Exam: Vitals:   06/08/18 2025 06/08/18 2114 06/08/18 2145 06/09/18 0027  BP: (!) 162/65 (!) 134/119 100/79 (!) 147/50  Pulse: 95 97 96 (!) 102  Resp: 16 16 17 19   Temp:  98.8 F (37.1 C)    TempSrc:  Oral    SpO2: 99% 97% 95% 96%   General: Not in acute distress HEENT:       Eyes: PERRL, EOMI, no scleral icterus.       ENT: No discharge from the ears and nose, no pharynx injection, no tonsillar enlargement.        Neck: Difficult to assess JVD due to obesity. no bruit, no mass felt. Heme: No neck lymph node enlargement. Cardiac: S1/S2, RRR, No murmurs, No gallops or rubs. Respiratory: Decreased air movement bilaterally.  Has mild wheezing bilaterally. GI: Soft, nondistended, nontender, no rebound pain, no organomegaly,  BS present. GU: No hematuria Ext: 1+ pitting leg edema bilaterally.  Has chronic venous insufficiency change in both legs.  2+DP/PT pulse  bilaterally. Musculoskeletal: No joint deformities, No joint redness or warmth, no limitation of ROM in spin. Skin: No rashes.  Neuro: Alert, oriented X3, cranial nerves II-XII grossly intact, moves all extremities normally.  Psych: Patient is not psychotic, no suicidal or hemocidal ideation.  Labs on Admission: I have personally reviewed following labs and imaging studies  CBC: No results for input(s): WBC, NEUTROABS, HGB, HCT, MCV, PLT in the last 168 hours. Basic Metabolic Panel: Recent Labs  Lab 06/08/18 2115  NA 139  K 4.2  CL 94*  CO2 38*  GLUCOSE 121*  BUN 29*  CREATININE 1.57*  CALCIUM 8.9   GFR: CrCl cannot be calculated (Unknown ideal weight.). Liver Function Tests: No results for input(s): AST, ALT, ALKPHOS, BILITOT, PROT, ALBUMIN in the last 168 hours. No results for input(s): LIPASE, AMYLASE in the last 168 hours. No results for input(s): AMMONIA in the last 168 hours. Coagulation Profile: No results for input(s): INR, PROTIME in the last 168 hours. Cardiac Enzymes: No results for input(s): CKTOTAL, CKMB, CKMBINDEX, TROPONINI in the last 168 hours. BNP (last 3 results) No results for input(s): PROBNP in the last 8760 hours. HbA1C: No results for input(s): HGBA1C in the last 72 hours. CBG: No results for input(s): GLUCAP in the last 168 hours. Lipid Profile: No results for input(s): CHOL, HDL, LDLCALC, TRIG, CHOLHDL, LDLDIRECT in the last 72 hours. Thyroid Function Tests: No results for input(s): TSH, T4TOTAL, FREET4, T3FREE, THYROIDAB in the last 72 hours. Anemia Panel: No results for input(s): VITAMINB12, FOLATE, FERRITIN, TIBC, IRON, RETICCTPCT in the last 72 hours. Urine analysis:    Component Value Date/Time   COLORURINE YELLOW 04/07/2018 1820   APPEARANCEUR HAZY (A) 04/07/2018 1820   LABSPEC 1.015 04/07/2018 1820   PHURINE 6.0 04/07/2018 1820   GLUCOSEU NEGATIVE 04/07/2018 1820   GLUCOSEU NEGATIVE 08/17/2016 1340   HGBUR SMALL (A) 04/07/2018  1820   BILIRUBINUR NEGATIVE 04/07/2018 1820   BILIRUBINUR neg 01/15/2015 [place] 04/07/2018 1820   PROTEINUR 30 (A) 04/07/2018 1820   UROBILINOGEN 1.0 08/17/2016 1340   NITRITE NEGATIVE 04/07/2018 1820   LEUKOCYTESUR NEGATIVE 04/07/2018 1820   Sepsis Labs: @LABRCNTIP (procalcitonin:4,lacticidven:4) )No results found for this or any previous visit (from the past 240 hour(s)).   Radiological Exams on Admission: Dg Chest 2 View  Result Date: 06/08/2018 CLINICAL DATA:  63 y/o F; history of COPD and CHF. Shortness of breath. EXAM: CHEST - 2 VIEW COMPARISON:  04/07/2018 chest radiograph. 05/03/2018 CT chest. FINDINGS: Enlarged cardiac silhouette. Aortic atherosclerosis with calcification. Pulmonary vascular congestion. No focal consolidation. No pleural effusion or pneumothorax. No acute osseous abnormality is evident. IMPRESSION: Enlarged cardiac silhouette. Pulmonary vascular congestion. No focal consolidation. Electronically Signed   By: Kristine Garbe M.D.   On: 06/08/2018 20:12     EKG: Independently reviewed.  Sinus rhythm, QTC 441, low voltage, poor R wave progression, anteroseptal infarction pattern, nonspecific T wave change.  Assessment/Plan Principal Problem:   Acute on chronic respiratory failure with hypoxia (HCC) Active Problems:   Type II diabetes mellitus with renal manifestations (HCC)   Essential hypertension   CKD (chronic kidney disease), stage III (HCC)   OSA (obstructive sleep apnea)   Acute on chronic diastolic CHF (congestive heart failure) (HCC)   HLD (hyperlipidemia)   Hypothyroidism   Depression   Fibromyalgia   COPD exacerbation ([place])  Acute on chronic respiratory failure with hypoxia ([place]): Likely due to combination of COPD exacerbation given wheezing and decreased air movement on auscultation; and a CHF exacerbation given bilateral leg edema, vascular congestion on chest x-ray, elevated BNP 326.  -Place patient on telemetry  bed for observation -Bronchodilators -Nasal cannula oxygen to maintain oxygen saturation above 93% on room air -Treat COPD and CHF exacerbation as below respectively. -f/u CBC  COPD exacerbation: -Nebulizers: scheduled Duoneb and prn albuterol -Dulera inhaler -Solu-Medrol 60 mg IV tid -Z pak -Mucinex for cough  -Incentive spirometry -Follow up blood culture x2, sputum culture,Flu pcr -Nasal cannula oxygen as needed to maintain O2 saturation 93% or greater  Acute on chronic diastolic CHF: 2D echo on 11/55/2080 showed EF 60-65%. -give lasix 60 mg by IV--> will need to reevaluate renal function before giving further IV diuretics. -continue home torsemide 20 mg daily  Type II diabetes mellitus with renal manifestations ([place]): Last A1c 5.2 on 04/09/18, well controled. Patient is taking Humalog and Lantus at home -will decrease Lantus dose from 30 to 20 units daily -SSI  Hypertension: -Continue Cozaar, Cardizem -IV hydralazine as needed  CKD (chronic kidney disease), stage III ([place]): Close to baseline.  Baseline creatinine 1.2-1.5.  Her creatinine is 1.57, BUN 29. -Follow-up by BMP  OSA (obstructive sleep apnea): -CPAP  HLD (hyperlipidemia): -Pravastatin  Hypothyroidism: -Synthroid  Depression: -Continue nortriptyline  Fibromyalgia, chronic neck pain in the back pain: -Continue home MS Contin, Tizanidine        DVT ppx: SQ Lovenox Code Status: Full code Family Communication: None at bed side.   Disposition Plan:  Anticipate discharge back to previous home environment Consults called:  none Admission status: Obs / tele    Date of Service 06/09/2018    [facility]   If 7PM-7AM, please contact night-coverage [email or web address] Password [facility] 06/09/2018, 3:58 AM

## 2018-06-08 NOTE — ED Provider Notes (Signed)
[facility] DEPT Provider Note   CSN: [id number] Arrival date & time: 06/08/18  1828     History   Chief Complaint Chief Complaint  Patient presents with  . Shortness of Breath    HPI KADIE BALESTRIERI is a 63 y.o. female.  The history is provided by the patient and medical records. No language interpreter was used.  Shortness of Breath     63 year old female who is morbidly obese, oxygen dependent, with history of CHF, COPD, hypertension, obstructive sleep apnea presented to ED for evaluation of shortness of breath.  Patient reports last month she was admitted to the hospital for CHF exacerbation, discharged home and she has been receiving physical therapy twice weekly.  Today when her physical therapist came to check on her and upon initiating her exercise, she noticed that her oxygen dips down to the 70s despite being on her usual 3 L nasal cannula.  She rested and wait for her O2 to improve but it takes a much longer time for that to improve.  She then tried again with her routine and became lightheadedness with increased shortness of breath even with mild exertion.  O2 sats dropped down to 70s again.  She reached out to her doctor who recommended increasing her O2 to 3.5L.  Patient tries 4 L nasal cannula but her O2 sats still does not rebound adequately, thus prompting her to come here.  Furthermore, she also noticed 2-1/2 pounds of weight gain just today which is unusual for her.  She denies any recent medication changes and she does not complain of any active chest pain or abdominal pain.  No fever chills or productive cough.  No other symptoms.  Past Medical History:  Diagnosis Date  . Abdominal mass of other site   . Cervical compression fracture ([place])   . CHF (congestive heart failure) ([place])   . Chronic kidney disease, stage 3 (HCC)    Borderline Stage 2-3  . Chronic lower limb pain   . COPD (chronic obstructive pulmonary disease) ([place])   . CTS  (carpal tunnel syndrome)   . Depression   . Diabetes ([place])    Type II  . Fibromyalgia   . Hepatitis C    cured last year (2018)  . Hypercholesteremia   . Hypertension   . Hypothyroidism   . Morbid obesity ([place])   . Neuropathy    Diabetes  . OSA (obstructive sleep apnea)   . Osteoarthritis   . Renal cancer ([place])    Left Kidney Removed  . RLS (restless legs syndrome)   . Syncope   . Venous stasis     Patient Active Problem List   Diagnosis Date Noted  . Lung nodule 05/08/2018  . Morbid obesity with BMI of 50.0-59.9, adult ([place]) 04/10/2018  . Chronic pain 04/07/2018  . Acute on chronic diastolic CHF (congestive heart failure) ([place]) 04/07/2018  . PNA (pneumonia) 03/21/2018  . COPD (chronic obstructive pulmonary disease) ([place]) 02/25/2018  . Chronic pain syndrome   . Normocytic anemia 02/24/2018  . OSA (obstructive sleep apnea) 08/16/2017  . Morbid obesity due to excess calories ([place]) 04/29/2017  . Cor pulmonale (chronic) ([place]) 04/29/2017  . Chronic respiratory failure with hypoxia and hypercapnia ([place]) 04/28/2017  . CKD (chronic kidney disease), stage III ([place]) 04/14/2016  . Hyperlipidemia 01/16/2016  . Essential hypertension 01/16/2016  . Hypoxia   . Thrombocytopenia ([place]) 07/12/2015  . COPD with acute exacerbation ([employer])   . Thyroid activity decreased 06/23/2015  .  Encounter for chronic pain management 09/02/2014  . Hernia of abdominal cavity 07/30/2014  . Abnormal CXR 02/15/2014  . COPD GOLDIII with min reversibility  12/16/2013  . History of hepatitis C 12/16/2013  . Breast cancer screening 12/16/2013  . Diabetes mellitus, type II, insulin dependent ([place]) 12/16/2013  . Screening for osteoporosis 12/16/2013    Past Surgical History:  Procedure Laterality Date  . ABDOMINAL HERNIA REPAIR     x2  . ABDOMINAL HYSTERECTOMY    . BLADDER SUSPENSION    . CHOLECYSTECTOMY    . CYST EXCISION     Head  . INCISIONAL HERNIA REPAIR    . KNEE ARTHROSCOPY     Bilateral  .  NEPHRECTOMY     Left  . TONSILLECTOMY    . TOOTH EXTRACTION    . UMBILICAL HERNIA REPAIR    . VAGINA SURGERY    . WISDOM TOOTH EXTRACTION       OB History   No obstetric history on file.      Home Medications    Prior to Admission medications   Medication Sig Start Date End Date Taking? Authorizing Provider  albuterol (PROAIR HFA) 108 (90 Base) MCG/ACT inhaler Inhale 1-2 puffs into the lungs every 6 (six) hours as needed for wheezing or shortness of breath. 01/20/18   Tanda Rockers, MD  benzonatate (TESSALON) 200 MG capsule Take 1 capsule (200 mg total) by mouth 3 (three) times daily as needed for cough. 05/16/18   [name], PA-C  budesonide-formoterol [facility]) 160-4.5 MCG/ACT inhaler Inhale 2 puffs into the lungs 2 (two) times daily. 05/04/18   Tanda Rockers, MD  clotrimazole-betamethasone (LOTRISONE) cream Apply 1 application topically 2 (two) times daily as needed. 05/02/18   [name], PA-C  diltiazem (CARDIZEM CD) 180 MG 24 hr capsule Take 1 capsule (180 mg total) by mouth daily. 06/02/18   [name], PA-C  doxycycline (VIBRAMYCIN) 100 MG capsule Take 1 capsule (100 mg total) by mouth 2 (two) times daily. 05/16/18   [name], PA-C  glucose blood (GLUCOSE METER TEST) test strip AccuCheck Inform 2 stips Check blood sugars twice daily Dx E11.9 05/02/18   [name], PA-C  guaiFENesin (MUCINEX) 600 MG 12 hr tablet Take 1 tablet (600 mg total) by mouth 2 (two) times daily. 03/02/18   Mariel Aloe, MD  HUMALOG 100 UNIT/ML injection INJECT 0.15 ML (15 UNITS) INTO THE SKIN 3 TIMES DAILY AS NEEDED FOR HIGH BLOOD SUGAR Patient taking differently: Inject 15 Units into the skin 3 (three) times daily as needed for high blood sugar. Uses sliding scale. Sugar over 200 will give insulin. 12/29/17   [name], PA-C  insulin glargine (LANTUS) 100 UNIT/ML injection INJECT 0.3MLS (30 UNITS TOTAL) INTO THE SKIN EVERY DAY Patient taking differently:  Inject 30 Units into the skin every morning. INJECT 0.3MLS (30 UNITS TOTAL) INTO THE SKIN EVERY DAY 01/24/18   [name], PA-C  ipratropium-albuterol (DUONEB) 0.5-2.5 (3) MG/3ML SOLN Take 3 mLs by nebulization 4 (four) times daily. Dx: J44.9 03/21/18   Parrett, Fonnie Mu, NP  levothyroxine (SYNTHROID, LEVOTHROID) 88 MCG tablet Take 1 tablet (88 mcg total) by mouth daily before breakfast. Patient taking differently: Take 88 mcg by mouth at bedtime.  12/08/17   [name], PA-C  losartan (COZAAR) 25 MG tablet Take 25 mg by mouth daily.    [provider]  metoCLOPramide (REGLAN) 10 MG tablet Take 1 tablet (10 mg total)  by mouth every 6 (six) hours as needed for nausea. Patient taking differently: Take 10 mg by mouth 2 (two) times daily.  01/17/18   Cirigliano, Vito V, DO  morphine (MS CONTIN) 30 MG 12 hr tablet Take 30 mg by mouth every 12 (twelve) hours. 03/23/18   [provider]  Multiple Vitamins-Minerals (CENTRUM SILVER PO) Take by mouth.    [provider]  nortriptyline (PAMELOR) 25 MG capsule Take 1 capsule (25 mg total) by mouth at bedtime. 03/08/18   [name], PA-C  nystatin (MYCOSTATIN/NYSTOP) powder APPLY TOPICALLY TWICE DAILY AS NEEDED FOR YEAST INFECTION 09/27/17   [name], PA-C  OXYGEN O2 3L at home.    [provider]  pravastatin (PRAVACHOL) 20 MG tablet TAKE ONE TABLET BY MOUTH EVERY DAY 05/05/18   [name], PA-C  predniSONE (DELTASONE) 20 MG tablet Take 2 tablets (40 mg total) by mouth daily with breakfast. 05/16/18   [name], PA-C  Probiotic Product (PROBIOTIC PO) Take by mouth.    [provider]  SURE COMFORT INSULIN SYRINGE 30G X 1/2" 1 ML MISC USE FOUR (4) TIMES DAILY AS DIRECTED Patient taking differently: Inject 1 Syringe into the skin 4 (four) times daily.  08/31/16   [name], PA-C  tiZANidine (ZANAFLEX) 2 MG tablet Take 2-4 mg by mouth every 12 (twelve) hours as needed for  muscle spasms.    [provider]  torsemide (DEMADEX) 20 MG tablet Take 1 tablet (20 mg total) by mouth daily. 04/12/18   Orson Eva, MD  varenicline (CHANTIX PAK) 0.5 MG X 11 & 1 MG X 42 tablet Take one 0.5 mg tablet by mouth QD x 3 days, then increase to one 0.5 mg tablet BID x 4 days, then increase to one 1 mg tablet BID. 05/02/18   [name], PA-C    Family History Family History  Problem Relation Age of Onset  . Heart attack Mother 72       Deceased  . Heart disease Mother   . Emphysema Mother   . Alcoholism Mother   . COPD Father 33       Deceased  . Emphysema Father   . Alcoholism Father   . Esophageal varices Father   . Alcoholism Paternal Grandfather   . Diabetes Maternal Grandmother   . Heart disease Maternal Grandmother   . Lung cancer Maternal Grandfather   . Emphysema Maternal Grandfather   . Brain cancer Maternal Aunt   . Diabetes Sister   . Heart defect Sister   . Cancer Sister        was told her sister had cancer but beat it and dont know which one   . Heart defect Sister   . Obesity Son   . Breast cancer Maternal Aunt   . Colon cancer Neg Hx   . Esophageal cancer Neg Hx     Social History Social History   Tobacco Use  . Smoking status: Former Smoker    Packs/day: 2.50    Years: 45.00    Pack years: 112.50    Types: Cigarettes    Last attempt to quit: 07/21/2016    Years since quitting: 1.8  . Smokeless tobacco: Never Used  Substance Use Topics  . Alcohol use: No  . Drug use: No     Allergies   Gabapentin; Lyrica [pregabalin]; Ketorolac tromethamine; and Lisinopril   Review of Systems Review of Systems  Respiratory: Positive for shortness of breath.  All other systems reviewed and are negative.    Physical Exam Updated Vital Signs BP (!) 162/65   Pulse 95   Temp 98.7 F (37.1 C) (Oral)   Resp 16   SpO2 99%   Physical Exam Vitals signs and nursing note reviewed.  Constitutional:      General: She is not in  acute distress.    Appearance: She is well-developed. She is obese.  HENT:     Head: Atraumatic.  Eyes:     Conjunctiva/sclera: Conjunctivae normal.  Neck:     Musculoskeletal: Neck supple.     Vascular: No JVD.  Cardiovascular:     Rate and Rhythm: Normal rate and regular rhythm.  Pulmonary:     Comments: Decreased breath sounds with faint wheezes heard. Abdominal:     General: There is distension.  Skin:    Findings: No rash.     Comments: Chronic venous skin changes to bilateral lower extremities with intact distal pulses.  2+ edema noted  Neurological:     Mental Status: She is alert.      ED Treatments / Results  Labs (all labs ordered are listed, but only abnormal results are displayed) Labs Reviewed  BASIC METABOLIC PANEL - Abnormal; Notable for the following components:      Result Value   Chloride 94 (*)    CO2 38 (*)    Glucose, Bld 121 (*)    BUN 29 (*)    Creatinine, Ser 1.57 (*)    GFR calc non Af Amer 34 (*)    GFR calc Af Amer 40 (*)    All other components within normal limits  BRAIN NATRIURETIC PEPTIDE - Abnormal; Notable for the following components:   B Natriuretic Peptide 326.6 (*)    All other components within normal limits  I-STAT TROPONIN, ED    EKG EKG Interpretation  Date/Time:  Thursday June 08 2018 18:55:43 EST Ventricular Rate:  98 PR Interval:    QRS Duration: 93 QT Interval:  345 QTC Calculation: 441 R Axis:   -8 Text Interpretation:  Sinus rhythm Low voltage, extremity and precordial leads Anteroseptal infarct, old Minimal ST elevation, inferior leads Confirmed by Lacretia Leigh ([id number]) on 06/08/2018 10:50:21 PM   ED ECG REPORT   Date: 06/08/2018  Rate: 98  Rhythm: normal sinus rhythm  QRS Axis: normal  Intervals: normal  ST/T Wave abnormalities: nonspecific ST changes  Conduction Disutrbances:none  Narrative Interpretation:   Old EKG Reviewed: unchanged  I have personally reviewed the EKG tracing and agree with the  computerized printout as noted.   Radiology Dg Chest 2 View  Result Date: 06/08/2018 CLINICAL DATA:  63 y/o F; history of COPD and CHF. Shortness of breath. EXAM: CHEST - 2 VIEW COMPARISON:  04/07/2018 chest radiograph. 05/03/2018 CT chest. FINDINGS: Enlarged cardiac silhouette. Aortic atherosclerosis with calcification. Pulmonary vascular congestion. No focal consolidation. No pleural effusion or pneumothorax. No acute osseous abnormality is evident. IMPRESSION: Enlarged cardiac silhouette. Pulmonary vascular congestion. No focal consolidation. Electronically Signed   By: Kristine Garbe M.D.   On: 06/08/2018 20:12    Procedures .Critical Care Performed by: Domenic Moras, PA-C Authorized by: Domenic Moras, PA-C   Critical care provider statement:    Critical care time (minutes):  45   Critical care was time spent personally by me on the following activities:  Discussions with consultants, evaluation of patient's response to treatment, examination of patient, ordering and performing treatments and interventions, ordering and review of laboratory studies, ordering  and review of radiographic studies, pulse oximetry, re-evaluation of patient's condition, obtaining history from patient or surrogate and review of old charts   (including critical care time)  Medications Ordered in ED Medications  ipratropium-albuterol (DUONEB) 0.5-2.5 (3) MG/3ML nebulizer solution 3 mL (has no administration in time range)  predniSONE (DELTASONE) tablet 60 mg (has no administration in time range)  albuterol (PROVENTIL,VENTOLIN) solution continuous neb (has no administration in time range)  ipratropium-albuterol (DUONEB) 0.5-2.5 (3) MG/3ML nebulizer solution 3 mL (3 mLs Nebulization Given 06/08/18 1932)     Initial Impression / Assessment and Plan / ED Course  I have reviewed the triage vital signs and the nursing notes.  Pertinent labs & imaging results that were available during my care of the patient  were reviewed by me and considered in my medical decision making (see chart for details).     BP (!) 162/65   Pulse 95   Temp 98.7 F (37.1 C) (Oral)   Resp 16   SpO2 99%    Final Clinical Impressions(s) / ED Diagnoses   Final diagnoses:  COPD exacerbation [facility])    ED Discharge Orders    None     7:17 PM Patient with history of CHF here complaining of shortness of breath while she was participating physical therapy.  She does not have any active chest pain at this time.  O2 sats 99% on 3 L.  Work-up initiated.  Care discussed with Dr. Zenia Resides  9:56 PM There is a delay in lab since patient is a difficult IV stick.  At this time patient is resting comfortably in no acute discomfort.  No hypoxia.  10:25 PM EKG and troponin are unremarkable.  Mild AKI with creatinine of 1.57.  BNP is 326, improved from prior, chest x-ray shows enlarged cardiac silhouette with pulmonary vascular congestion but no focal consolidation.   10:50 PM Pt given 2 duonebs and prednisone without significant improvement of her sxs.  When ambulate, O2 drops to 88% on 3L and pt was symptomatic.  Will place on continuous nebs and will consult for admission.  Suspect COPD exacerbation.   12:02 AM Appreciate consultation from [place] DR. Niu who agrees to admit pt for further care.    Domenic Moras, PA-C 06/09/18 0003    Lacretia Leigh, MD 06/09/18 [phone]

## 2018-06-08 NOTE — ED Provider Notes (Signed)
Medical screening examination/treatment/procedure(s) were conducted as a shared visit with non-physician practitioner(s) and myself.  I personally evaluated the patient during the encounter.  EKG Interpretation  Date/Time:  Thursday June 08 2018 18:55:43 EST Ventricular Rate:  98 PR Interval:    QRS Duration: 93 QT Interval:  345 QTC Calculation: 441 R Axis:   -8 Text Interpretation:  Sinus rhythm Low voltage, extremity and precordial leads Anteroseptal infarct, old Minimal ST elevation, inferior leads Confirmed by Lacretia Leigh ([id number]) on 06/08/2018 10:48:27 PM  63 year old female presents with increased shortness of breath.  Has a history of COPD as well as CHF.  BNP is mildly elevated but not too much above her baseline.  Chest x-ray without overt failure.  Suspect COPD exacerbation will admit to the hospitalist service   Lacretia Leigh, MD 06/08/18 2304

## 2018-06-08 NOTE — Telephone Encounter (Signed)
Spoke with patient and she advised that currently her O2 will not go above 90.  Patient has been advised that PCP states she needs ER assessment, or at the very least she needs to call pulmonology and see if she can get in with them today for assessment.  She stated verbal understanding.  States that she did not really want to go to the ER, but that she understood why we were saying this and that she would seek out help.

## 2018-06-09 ENCOUNTER — Encounter (HOSPITAL_COMMUNITY): Payer: Self-pay | Admitting: Internal Medicine

## 2018-06-09 ENCOUNTER — Other Ambulatory Visit: Payer: Self-pay

## 2018-06-09 DIAGNOSIS — J441 Chronic obstructive pulmonary disease with (acute) exacerbation: Secondary | ICD-10-CM | POA: Diagnosis present

## 2018-06-09 DIAGNOSIS — J9622 Acute and chronic respiratory failure with hypercapnia: Secondary | ICD-10-CM | POA: Diagnosis present

## 2018-06-09 DIAGNOSIS — E785 Hyperlipidemia, unspecified: Secondary | ICD-10-CM | POA: Diagnosis present

## 2018-06-09 DIAGNOSIS — E039 Hypothyroidism, unspecified: Secondary | ICD-10-CM | POA: Diagnosis present

## 2018-06-09 DIAGNOSIS — M797 Fibromyalgia: Secondary | ICD-10-CM | POA: Diagnosis present

## 2018-06-09 DIAGNOSIS — F329 Major depressive disorder, single episode, unspecified: Secondary | ICD-10-CM | POA: Diagnosis present

## 2018-06-09 DIAGNOSIS — F32A Depression, unspecified: Secondary | ICD-10-CM | POA: Diagnosis present

## 2018-06-09 LAB — GLUCOSE, CAPILLARY
Glucose-Capillary: 236 mg/dL — ABNORMAL HIGH (ref 70–99)
Glucose-Capillary: 282 mg/dL — ABNORMAL HIGH (ref 70–99)

## 2018-06-09 LAB — CBC
HCT: 32.1 % — ABNORMAL LOW (ref 36.0–46.0)
Hemoglobin: 9.2 g/dL — ABNORMAL LOW (ref 12.0–15.0)
MCH: 26.2 pg (ref 26.0–34.0)
MCHC: 28.7 g/dL — ABNORMAL LOW (ref 30.0–36.0)
MCV: 91.5 fL (ref 80.0–100.0)
Platelets: 54 10*3/uL — ABNORMAL LOW (ref 150–400)
RBC: 3.51 MIL/uL — ABNORMAL LOW (ref 3.87–5.11)
RDW: 17.6 % — ABNORMAL HIGH (ref 11.5–15.5)
WBC: 4.4 10*3/uL (ref 4.0–10.5)
nRBC: 0 % (ref 0.0–0.2)

## 2018-06-09 LAB — BASIC METABOLIC PANEL
Anion gap: 12 (ref 5–15)
BUN: 36 mg/dL — ABNORMAL HIGH (ref 8–23)
CO2: 35 mmol/L — ABNORMAL HIGH (ref 22–32)
Calcium: 8.7 mg/dL — ABNORMAL LOW (ref 8.9–10.3)
Chloride: 90 mmol/L — ABNORMAL LOW (ref 98–111)
Creatinine, Ser: 1.4 mg/dL — ABNORMAL HIGH (ref 0.44–1.00)
GFR calc Af Amer: 46 mL/min — ABNORMAL LOW (ref >60)
GFR calc non Af Amer: 40 mL/min — ABNORMAL LOW (ref >60)
Glucose, Bld: 270 mg/dL — ABNORMAL HIGH (ref 70–99)
Potassium: 4.1 mmol/L (ref 3.5–5.1)
SODIUM: 137 mmol/L (ref 135–145)

## 2018-06-09 LAB — CBG MONITORING, ED
Glucose-Capillary: 214 mg/dL — ABNORMAL HIGH (ref 70–99)
Glucose-Capillary: 88 mg/dL (ref 70–99)

## 2018-06-09 LAB — INFLUENZA PANEL BY PCR (TYPE A & B)
INFLAPCR: NEGATIVE
INFLBPCR: NEGATIVE

## 2018-06-09 MED ORDER — IPRATROPIUM-ALBUTEROL 0.5-2.5 (3) MG/3ML IN SOLN: 3.0000 mL | RESPIRATORY_TRACT | Status: DC

## 2018-06-09 MED ORDER — ONDANSETRON HCL 4 MG/2ML IJ SOLN
4.0000 mg | Freq: Three times a day (TID) | INTRAMUSCULAR | Status: DC | PRN
  Administered 2018-06-11: 4 mg via INTRAVENOUS
  Filled 2018-06-09: qty 2

## 2018-06-09 MED ORDER — INSULIN GLARGINE 100 UNIT/ML ~~LOC~~ SOLN
20.0000 [IU] | Freq: Every day | SUBCUTANEOUS | Status: DC
  Administered 2018-06-09 – 2018-06-14 (×6): 20 [IU] via SUBCUTANEOUS
  Filled 2018-06-09 (×6): qty 0.2

## 2018-06-09 MED ORDER — FUROSEMIDE 10 MG/ML IJ SOLN
60.0000 mg | Freq: Once | INTRAMUSCULAR | Status: AC
  Administered 2018-06-09: 60 mg via INTRAVENOUS
  Filled 2018-06-09: qty 8

## 2018-06-09 MED ORDER — LOSARTAN POTASSIUM 25 MG PO TABS
25.0000 mg | ORAL_TABLET | Freq: Every day | ORAL | Status: DC
  Administered 2018-06-09 – 2018-06-12 (×4): 25 mg via ORAL
  Filled 2018-06-09 (×4): qty 1

## 2018-06-09 MED ORDER — ALBUTEROL SULFATE (2.5 MG/3ML) 0.083% IN NEBU: 2.5000 mg | INHALATION_SOLUTION | RESPIRATORY_TRACT | Status: DC | PRN

## 2018-06-09 MED ORDER — PRAVASTATIN SODIUM 20 MG PO TABS
20.0000 mg | ORAL_TABLET | Freq: Every day | ORAL | Status: DC
  Administered 2018-06-09 – 2018-06-14 (×6): 20 mg via ORAL
  Filled 2018-06-09 (×6): qty 1

## 2018-06-09 MED ORDER — IPRATROPIUM-ALBUTEROL 0.5-2.5 (3) MG/3ML IN SOLN
3.0000 mL | Freq: Three times a day (TID) | RESPIRATORY_TRACT | Status: DC
  Administered 2018-06-10 – 2018-06-14 (×12): 3 mL via RESPIRATORY_TRACT
  Filled 2018-06-09 (×15): qty 3

## 2018-06-09 MED ORDER — INSULIN ASPART 100 UNIT/ML ~~LOC~~ SOLN
0.0000 [IU] | Freq: Three times a day (TID) | SUBCUTANEOUS | Status: DC
  Administered 2018-06-09 – 2018-06-10 (×3): 3 [IU] via SUBCUTANEOUS
  Administered 2018-06-10: 5 [IU] via SUBCUTANEOUS
  Administered 2018-06-10 – 2018-06-12 (×5): 3 [IU] via SUBCUTANEOUS
  Administered 2018-06-12: 5 [IU] via SUBCUTANEOUS
  Administered 2018-06-12: 2 [IU] via SUBCUTANEOUS
  Administered 2018-06-13 – 2018-06-14 (×4): 3 [IU] via SUBCUTANEOUS
  Filled 2018-06-09: qty 1

## 2018-06-09 MED ORDER — HYDRALAZINE HCL 20 MG/ML IJ SOLN: 5.0000 mg | INTRAMUSCULAR | Status: DC | PRN

## 2018-06-09 MED ORDER — TORSEMIDE 20 MG PO TABS
20.0000 mg | ORAL_TABLET | Freq: Every day | ORAL | Status: DC
  Administered 2018-06-09 – 2018-06-12 (×4): 20 mg via ORAL
  Filled 2018-06-09 (×5): qty 1

## 2018-06-09 MED ORDER — TIZANIDINE HCL 4 MG PO TABS
2.0000 mg | ORAL_TABLET | Freq: Two times a day (BID) | ORAL | Status: DC | PRN
  Administered 2018-06-10: 4 mg via ORAL
  Filled 2018-06-09: qty 1

## 2018-06-09 MED ORDER — ENOXAPARIN SODIUM 40 MG/0.4ML ~~LOC~~ SOLN: 40.0000 mg | SUBCUTANEOUS | Status: DC

## 2018-06-09 MED ORDER — ADULT MULTIVITAMIN W/MINERALS CH
1.0000 | ORAL_TABLET | Freq: Every day | ORAL | Status: DC
  Administered 2018-06-09 – 2018-06-15 (×7): 1 via ORAL
  Filled 2018-06-09 (×7): qty 1

## 2018-06-09 MED ORDER — AZITHROMYCIN 250 MG PO TABS
250.0000 mg | ORAL_TABLET | Freq: Every day | ORAL | Status: AC
  Administered 2018-06-10 – 2018-06-13 (×4): 250 mg via ORAL
  Filled 2018-06-09 (×4): qty 1

## 2018-06-09 MED ORDER — LEVOTHYROXINE SODIUM 88 MCG PO TABS
88.0000 ug | ORAL_TABLET | Freq: Every day | ORAL | Status: DC
  Administered 2018-06-09 – 2018-06-15 (×7): 88 ug via ORAL
  Filled 2018-06-09 (×7): qty 1

## 2018-06-09 MED ORDER — PROBIOTIC 250 MG PO CAPS: 1.0000 | ORAL_CAPSULE | Freq: Every day | ORAL | Status: DC

## 2018-06-09 MED ORDER — RISAQUAD PO CAPS
1.0000 | ORAL_CAPSULE | Freq: Every day | ORAL | Status: DC
  Administered 2018-06-09 – 2018-06-15 (×7): 1 via ORAL
  Filled 2018-06-09 (×7): qty 1

## 2018-06-09 MED ORDER — MOMETASONE FURO-FORMOTEROL FUM 200-5 MCG/ACT IN AERO
2.0000 | INHALATION_SPRAY | Freq: Two times a day (BID) | RESPIRATORY_TRACT | Status: DC
  Administered 2018-06-09 – 2018-06-15 (×13): 2 via RESPIRATORY_TRACT
  Filled 2018-06-09: qty 8.8

## 2018-06-09 MED ORDER — HEPARIN SODIUM (PORCINE) 5000 UNIT/ML IJ SOLN
5000.0000 [IU] | Freq: Three times a day (TID) | INTRAMUSCULAR | Status: DC
  Administered 2018-06-09 – 2018-06-15 (×18): 5000 [IU] via SUBCUTANEOUS
  Filled 2018-06-09 (×20): qty 1

## 2018-06-09 MED ORDER — NYSTATIN 100000 UNIT/GM EX POWD: 1.0000 g | Freq: Two times a day (BID) | CUTANEOUS | Status: DC | PRN

## 2018-06-09 MED ORDER — DILTIAZEM HCL ER COATED BEADS 180 MG PO CP24
180.0000 mg | ORAL_CAPSULE | Freq: Every day | ORAL | Status: DC
  Administered 2018-06-09 – 2018-06-15 (×7): 180 mg via ORAL
  Filled 2018-06-09 (×7): qty 1

## 2018-06-09 MED ORDER — DM-GUAIFENESIN ER 30-600 MG PO TB12
1.0000 | ORAL_TABLET | Freq: Two times a day (BID) | ORAL | Status: DC | PRN
  Filled 2018-06-09: qty 1

## 2018-06-09 MED ORDER — CLOTRIMAZOLE-BETAMETHASONE 1-0.05 % EX CREA: 1.0000 "application " | TOPICAL_CREAM | Freq: Two times a day (BID) | CUTANEOUS | Status: DC | PRN

## 2018-06-09 MED ORDER — ZOLPIDEM TARTRATE 5 MG PO TABS
5.0000 mg | ORAL_TABLET | Freq: Every evening | ORAL | Status: DC | PRN
  Administered 2018-06-09: 5 mg via ORAL
  Filled 2018-06-09: qty 1

## 2018-06-09 MED ORDER — METHYLPREDNISOLONE SODIUM SUCC 125 MG IJ SOLR
60.0000 mg | Freq: Three times a day (TID) | INTRAMUSCULAR | Status: DC
  Administered 2018-06-09 – 2018-06-14 (×16): 60 mg via INTRAVENOUS
  Filled 2018-06-09 (×16): qty 2

## 2018-06-09 MED ORDER — NORTRIPTYLINE HCL 25 MG PO CAPS
25.0000 mg | ORAL_CAPSULE | Freq: Every day | ORAL | Status: DC
  Administered 2018-06-09 – 2018-06-14 (×7): 25 mg via ORAL
  Filled 2018-06-09 (×7): qty 1

## 2018-06-09 MED ORDER — ACETAMINOPHEN 325 MG PO TABS: 650.0000 mg | ORAL_TABLET | Freq: Four times a day (QID) | ORAL | Status: DC | PRN

## 2018-06-09 MED ORDER — AZITHROMYCIN 250 MG PO TABS
500.0000 mg | ORAL_TABLET | Freq: Every day | ORAL | Status: AC
  Administered 2018-06-09: 500 mg via ORAL
  Filled 2018-06-09: qty 2

## 2018-06-09 MED ORDER — MORPHINE SULFATE ER 30 MG PO TBCR
30.0000 mg | EXTENDED_RELEASE_TABLET | Freq: Two times a day (BID) | ORAL | Status: DC
  Administered 2018-06-09 – 2018-06-15 (×14): 30 mg via ORAL
  Filled 2018-06-09 (×2): qty 1
  Filled 2018-06-09: qty 2
  Filled 2018-06-09 (×5): qty 1
  Filled 2018-06-09: qty 2
  Filled 2018-06-09: qty 1
  Filled 2018-06-09: qty 2
  Filled 2018-06-09 (×4): qty 1

## 2018-06-09 MED ORDER — METOCLOPRAMIDE HCL 10 MG PO TABS
10.0000 mg | ORAL_TABLET | Freq: Two times a day (BID) | ORAL | Status: DC
  Administered 2018-06-09 – 2018-06-15 (×13): 10 mg via ORAL
  Filled 2018-06-09 (×13): qty 1

## 2018-06-09 MED ORDER — IPRATROPIUM-ALBUTEROL 0.5-2.5 (3) MG/3ML IN SOLN
3.0000 mL | Freq: Four times a day (QID) | RESPIRATORY_TRACT | Status: DC
  Administered 2018-06-09 (×3): 3 mL via RESPIRATORY_TRACT
  Filled 2018-06-09 (×3): qty 3

## 2018-06-09 NOTE — Progress Notes (Signed)
Pt states she likes to go on her CPAP late. Stated she will call when ready to go on CPAP.

## 2018-06-09 NOTE — Progress Notes (Addendum)
PROGRESS NOTE  Renee STYRON Noble:[id number] DOB: 08/19/1955 DOA: 06/08/2018 PCP: [name], PA-C  HPI/Recap of past 24 hours:   Renee Noble  is a 63 year old female with past medical history of solitary kidney, chronic kidney disease stage III hypertension hyperlipidemia diabetes mellitus COPD on 3 L/min at home CHF SCV orthotic sleep apnea on CPAP fibromyalgia chronic back pain and neck pain hypothyroidism depression.  Was admitted for shortness of breath on January 20 3rd-20 20 and he came from home Admitted June 08, 2018 from home for progressive shortness of breath and oxygen desaturation despite oxygen therapy at 3 L/min.  Physical therapist noted during ambulation that her oxygen dropped to the 70s during a few minutes of walking.  Subjective: Patient seen and examined at bedside she is feeling much better.  Her oxygen is still desaturating when she walks or moves around to between 89 and 90 % on 3 L/min.  She looks comfortable at rest in her bed      Assessment/Plan: Principal Problem:   Acute on chronic respiratory failure with hypoxia (HCC) Active Problems:   Type II diabetes mellitus with renal manifestations (HCC)   Essential hypertension   CKD (chronic kidney disease), stage III (HCC)   OSA (obstructive sleep apnea)   Acute on chronic diastolic CHF (congestive heart failure) (HCC)   HLD (hyperlipidemia)   Hypothyroidism   Depression   Fibromyalgia   COPD exacerbation ([place])   1.  COPD exacerbation she has improved a lot continue to monitor possible discharge in the morning if her oxygenation is improved  2.  Hypertension uncontrolled continue current medication  3.  Hypertension not very well controlled we will continue home medication and adjust if needed  4.  Type 2 diabetes mellitus continue current medication from home  5.  Obstructive sleep apnea patient uses BiPAP   6.  Hypothyroidism continue Synthroid  7.  Chronic kidney disease we will  follow BMP  8.  Acute on chronic diastolic dysfunction recent 2D echo showed 60 to 65% ejection fraction.  Patient is on home torsemide will continue that 20 mg daily d-dimer was just slightly elevated in the 300s   Code Status: full  Severity of Illness: The appropriate patient status for this patient is OBSERVATION. Observation status is judged to be reasonable and necessary in order to provide the required intensity of service to ensure the patient's safety. The patient's presenting symptoms, physical exam findings, and initial radiographic and laboratory data in the context of their medical condition is felt to place them at decreased risk for further clinical deterioration. Furthermore, it is anticipated that the patient will be medically stable for discharge from the hospital within 2 midnights of admission. The following factors support the patient status of observation.   " The patient's presenting symptoms include shortness of breath desaturation. " The physical exam findings include hypoxia. " The initial radiographic and laboratory data are chest x-ray pulmonary vascular congestion.     Family Communication: None at bedside  Disposition Plan: When stable   Consultants:  None  Procedures:  None  Antimicrobials:  Zithromax  DVT prophylaxis:  sq heparin   Objective: Vitals:   06/09/18 0730 06/09/18 0738 06/09/18 0800 06/09/18 0830  BP: (!) 143/64  (!) 143/66 (!) 147/62  Pulse: 96  96 96  Resp: 17  17 18   Temp:      TempSrc:      SpO2: 95% 95% 95% 92%  Weight:    115  kg  Height:    5\' 4"  (1.626 m)   No intake or output data in the 24 hours ending 06/09/18 0931 Filed Weights   06/09/18 0830  Weight: 115 kg   Body mass index is 43.52 kg/m.  Exam:  . General: 63 y.o. year-old female well developed well nourished in no acute distress.  Alert and oriented x3. . Cardiovascular: Regular rate and rhythm with no rubs or gallops.  No thyromegaly or JVD noted.    [name] Kitchen Respiratory: Not wheezing bilaterally wheezes or rales. Good inspiratory effort. . Abdomen: Soft nontender nondistended with normal bowel sounds x4 quadrants. . Musculoskeletal: +1 pitting lower extremity edema. 2/4 pulses in all 4 extremities.,  Stasis dermatitis changes noted . Skin: No ulcerative lesions noted or rashes, . Psychiatry: Mood is appropriate for condition and setting    Data Reviewed: CBC: Recent Labs  Lab 06/09/18 0438  WBC 4.4  HGB 9.2*  HCT 32.1*  MCV 91.5  PLT 54*   Basic Metabolic Panel: Recent Labs  Lab 06/08/18 2115  NA 139  K 4.2  CL 94*  CO2 38*  GLUCOSE 121*  BUN 29*  CREATININE 1.57*  CALCIUM 8.9   GFR: Estimated Creatinine Clearance: 45 mL/min (A) (by C-G formula based on SCr of 1.57 mg/dL (H)). Liver Function Tests: No results for input(s): AST, ALT, ALKPHOS, BILITOT, PROT, ALBUMIN in the last 168 hours. No results for input(s): LIPASE, AMYLASE in the last 168 hours. No results for input(s): AMMONIA in the last 168 hours. Coagulation Profile: No results for input(s): INR, PROTIME in the last 168 hours. Cardiac Enzymes: No results for input(s): CKTOTAL, CKMB, CKMBINDEX, TROPONINI in the last 168 hours. BNP (last 3 results) No results for input(s): PROBNP in the last 8760 hours. HbA1C: No results for input(s): HGBA1C in the last 72 hours. CBG: Recent Labs  Lab 06/09/18 0839  GLUCAP 214*   Lipid Profile: No results for input(s): CHOL, HDL, LDLCALC, TRIG, CHOLHDL, LDLDIRECT in the last 72 hours. Thyroid Function Tests: No results for input(s): TSH, T4TOTAL, FREET4, T3FREE, THYROIDAB in the last 72 hours. Anemia Panel: No results for input(s): VITAMINB12, FOLATE, FERRITIN, TIBC, IRON, RETICCTPCT in the last 72 hours. Urine analysis:    Component Value Date/Time   COLORURINE YELLOW 04/07/2018 1820   APPEARANCEUR HAZY (A) 04/07/2018 1820   LABSPEC 1.015 04/07/2018 1820   PHURINE 6.0 04/07/2018 1820   GLUCOSEU NEGATIVE  04/07/2018 1820   GLUCOSEU NEGATIVE 08/17/2016 1340   HGBUR SMALL (A) 04/07/2018 1820   BILIRUBINUR NEGATIVE 04/07/2018 1820   BILIRUBINUR neg 01/15/2015 [place] 04/07/2018 1820   PROTEINUR 30 (A) 04/07/2018 1820   UROBILINOGEN 1.0 08/17/2016 1340   NITRITE NEGATIVE 04/07/2018 1820   LEUKOCYTESUR NEGATIVE 04/07/2018 1820   Sepsis Labs: @LABRCNTIP (procalcitonin:4,lacticidven:4)  )No results found for this or any previous visit (from the past 240 hour(s)).    Studies: Dg Chest 2 View  Result Date: 06/08/2018 CLINICAL DATA:  63 y/o F; history of COPD and CHF. Shortness of breath. EXAM: CHEST - 2 VIEW COMPARISON:  04/07/2018 chest radiograph. 05/03/2018 CT chest. FINDINGS: Enlarged cardiac silhouette. Aortic atherosclerosis with calcification. Pulmonary vascular congestion. No focal consolidation. No pleural effusion or pneumothorax. No acute osseous abnormality is evident. IMPRESSION: Enlarged cardiac silhouette. Pulmonary vascular congestion. No focal consolidation. Electronically Signed   By: Kristine Garbe M.D.   On: 06/08/2018 20:12    Scheduled Meds: . acidophilus  1 capsule Oral Daily  . azithromycin  500 mg Oral  Daily   Followed by  . [START ON 06/10/2018] azithromycin  250 mg Oral Daily  . diltiazem  180 mg Oral Daily  . enoxaparin (LOVENOX) injection  40 mg Subcutaneous Q24H  . insulin aspart  0-9 Units Subcutaneous TID WC  . insulin glargine  20 Units Subcutaneous Daily  . ipratropium-albuterol  3 mL Nebulization Q6H  . levothyroxine  88 mcg Oral QAC breakfast  . losartan  25 mg Oral Daily  . methylPREDNISolone (SOLU-MEDROL) injection  60 mg Intravenous Q8H  . metoCLOPramide  10 mg Oral BID  . mometasone-formoterol  2 puff Inhalation BID  . morphine  30 mg Oral Q12H  . multivitamin with minerals  1 tablet Oral Daily  . nortriptyline  25 mg Oral QHS  . pravastatin  20 mg Oral q1800  . torsemide  20 mg Oral Daily    Continuous  Infusions:   LOS: 0 days     Cristal Deer, MD [facility]  To reach me or the doctor on call, go to: [email or web address] Password [email or web address]  06/09/2018, 9:31 AM

## 2018-06-09 NOTE — Plan of Care (Signed)
  Problem: Health Behavior/Discharge Planning: Goal: Ability to manage health-related needs will improve Outcome: Progressing   Problem: Clinical Measurements: Goal: Ability to maintain clinical measurements within normal limits will improve Outcome: Progressing Goal: Will remain free from infection Outcome: Progressing Goal: Diagnostic test results will improve Outcome: Progressing Goal: Respiratory complications will improve Outcome: Progressing Goal: Cardiovascular complication will be avoided Outcome: Progressing   Problem: Activity: Goal: Risk for activity intolerance will decrease Outcome: Progressing   Problem: Coping: Goal: Level of anxiety will decrease Outcome: Progressing   Problem: Elimination: Goal: Will not experience complications related to bowel motility Outcome: Progressing Goal: Will not experience complications related to urinary retention Outcome: Progressing   Problem: Education: Goal: Knowledge of disease or condition will improve Outcome: Progressing Goal: Knowledge of the prescribed therapeutic regimen will improve Outcome: Progressing   Problem: Activity: Goal: Ability to tolerate increased activity will improve Outcome: Progressing Goal: Will verbalize the importance of balancing activity with adequate rest periods Outcome: Progressing   Problem: Respiratory: Goal: Ability to maintain a clear airway will improve Outcome: Progressing Goal: Levels of oxygenation will improve Outcome: Progressing Goal: Ability to maintain adequate ventilation will improve Outcome: Progressing

## 2018-06-09 NOTE — ED Notes (Signed)
Pt provided meal tray

## 2018-06-09 NOTE — Progress Notes (Signed)
Pt states that she wants to hold off on CPAP until she gets a bed upstairs.  RT to monitor and assess as needed.

## 2018-06-09 NOTE — ED Notes (Signed)
ED TO INPATIENT HANDOFF REPORT  Name/Age/Gender Renee Noble 63 y.o. female  Code Status    Code Status Orders  (From admission, onward)         Start     Ordered   06/09/18 0033  Full code  Continuous     06/09/18 0033        Code Status History    Date Active Date Inactive Code Status Order ID Comments User Context   04/07/2018 2041 04/11/2018 1742 Full Code [id number]  Truett Mainland, DO Inpatient   02/24/2018 2355 03/02/2018 2023 Full Code [id number]  [name], MD Inpatient   07/11/2015 1846 07/17/2015 1511 Full Code [id number]  Debbe Odea, MD ED    Advance Directive Documentation     Most Recent Value  Type of Advance Directive  Healthcare Power of Attorney  Pre-existing out of facility DNR order (yellow form or pink MOST form)  -  "MOST" Form in Place?  -      Home/SNF/Other Home  Chief Complaint shortness of breath, H2O very low  Level of Care/Admitting Diagnosis ED Disposition    ED Disposition Condition [place]: [place] [100102]  Level of Care: Telemetry [5]  Admit to tele based on following criteria: Other see comments  Comments: CHF  Diagnosis: COPD exacerbation [facility]) [[id number]]  Admitting Physician: Ivor Costa [[id number]]  Attending Physician: Ivor Costa [[id number]]  PT Class (Do Not Modify): Observation [104]  PT Acc Code (Do Not Modify): Observation [10022]       Medical History Past Medical History:  Diagnosis Date  . Abdominal mass of other site   . Cervical compression fracture ([place])   . CHF (congestive heart failure) ([place])   . Chronic kidney disease, stage 3 (HCC)    Borderline Stage 2-3  . Chronic lower limb pain   . COPD (chronic obstructive pulmonary disease) ([place])   . CTS (carpal tunnel syndrome)   . Depression   . Diabetes ([place])    Type II  . Fibromyalgia   . Hepatitis C    cured last year (2018)  . Hypercholesteremia   . Hypertension   . Hypothyroidism   . Morbid obesity  ([place])   . Neuropathy    Diabetes  . OSA (obstructive sleep apnea)   . Osteoarthritis   . Renal cancer ([place])    Left Kidney Removed  . RLS (restless legs syndrome)   . Syncope   . Venous stasis     Allergies Allergies  Allergen Reactions  . Gabapentin Anaphylaxis  . Lyrica [Pregabalin] Shortness Of Breath    Trouble breathing  . Ketorolac Tromethamine Hives  . Lisinopril Cough    IV Location/Drains/Wounds Patient Lines/Drains/Airways Status   Active Line/Drains/Airways    Name:   Placement date:   Placement time:   Site:   Days:   External Urinary Catheter   04/07/18    1927    -   63          Labs/Imaging Results for orders placed or performed during the hospital encounter of 06/08/18 (from the past 48 hour(s))  Basic metabolic panel     Status: Abnormal   Collection Time: 06/08/18  9:15 PM  Result Value Ref Range   Sodium 139 135 - 145 mmol/L   Potassium 4.2 3.5 - 5.1 mmol/L   Chloride 94 (L) 98 - 111 mmol/L   CO2 38 (H) 22 - 32 mmol/L   Glucose, Bld  121 (H) 70 - 99 mg/dL   BUN 29 (H) 8 - 23 mg/dL   Creatinine, Ser 1.57 (H) 0.44 - 1.00 mg/dL   Calcium 8.9 8.9 - 10.3 mg/dL   GFR calc non Af Amer 34 (L) >60 mL/min   GFR calc Af Amer 40 (L) >60 mL/min   Anion gap 7 5 - 15    Comment: Performed at [facility], [place] [address], [place], [place] [address]  Brain natriuretic peptide     Status: Abnormal   Collection Time: 06/08/18  9:15 PM  Result Value Ref Range   B Natriuretic Peptide 326.6 (H) 0.0 - 100.0 pg/mL    Comment: Performed at [facility], [place] [address], [place], [place] [address]  I-stat troponin, ED     Status: None   Collection Time: 06/08/18  9:54 PM  Result Value Ref Range   Troponin i, poc 0.01 0.00 - 0.08 ng/mL   Comment 3            Comment: Due to the release kinetics of cTnI, a negative result within the first hours of the onset of symptoms does not rule out myocardial infarction with certainty. If  myocardial infarction is still suspected, repeat the test at appropriate intervals.   CBC     Status: Abnormal   Collection Time: 06/09/18  4:38 AM  Result Value Ref Range   WBC 4.4 4.0 - 10.5 K/uL   RBC 3.51 (L) 3.87 - 5.11 MIL/uL   Hemoglobin 9.2 (L) 12.0 - 15.0 g/dL   HCT 32.1 (L) 36.0 - 46.0 %   MCV 91.5 80.0 - 100.0 fL   MCH 26.2 26.0 - 34.0 pg   MCHC 28.7 (L) 30.0 - 36.0 g/dL   RDW 17.6 (H) 11.5 - 15.5 %   Platelets 54 (L) 150 - 400 K/uL    Comment: Immature Platelet Fraction may be clinically indicated, consider ordering this additional test [id number] CONSISTENT WITH PREVIOUS RESULT    nRBC 0.0 0.0 - 0.2 %    Comment: Performed at [facility], [place] [address], [place], [place] [address]  CBG monitoring, ED     Status: Abnormal   Collection Time: 06/09/18  8:39 AM  Result Value Ref Range   Glucose-Capillary 214 (H) 70 - 99 mg/dL  Influenza panel by PCR (type A & B)     Status: None   Collection Time: 06/09/18  8:40 AM  Result Value Ref Range   Influenza A By PCR NEGATIVE NEGATIVE   Influenza B By PCR NEGATIVE NEGATIVE    Comment: (NOTE) The Xpert Xpress Flu assay is intended as an aid in the diagnosis of  influenza and should not be used as a sole basis for treatment.  This  assay is FDA approved for nasopharyngeal swab specimens only. Nasal  washings and aspirates are unacceptable for Xpert Xpress Flu testing. Performed at [facility], [place] [address] [place], [place] [address]   CBG monitoring, ED     Status: None   Collection Time: 06/09/18 11:44 AM  Result Value Ref Range   Glucose-Capillary 88 70 - 99 mg/dL   Dg Chest 2 View  Result Date: 06/08/2018 CLINICAL DATA:  63 y/o F; history of COPD and CHF. Shortness of breath. EXAM: CHEST - 2 VIEW COMPARISON:  04/07/2018 chest radiograph. 05/03/2018 CT chest. FINDINGS: Enlarged cardiac silhouette. Aortic atherosclerosis with calcification. Pulmonary vascular congestion. No focal  consolidation. No pleural effusion or pneumothorax. No  acute osseous abnormality is evident. IMPRESSION: Enlarged cardiac silhouette. Pulmonary vascular congestion. No focal consolidation. Electronically Signed   By: Kristine Garbe M.D.   On: 06/08/2018 20:12    Pending Labs Unresulted Labs (From admission, onward)    Start     Ordered   06/09/18 0034  Culture, sputum-assessment  Once,   R     06/09/18 0033   06/09/18 0034  Culture, blood (routine x 2) Call MD if unable to obtain prior to antibiotics being given  BLOOD CULTURE X 2,   R    Comments:  If blood cultures drawn in Emergency Department - Do not draw and cancel order    06/09/18 0033   06/09/18 0033  HIV antibody  Once,   R     06/09/18 0033          Vitals/Pain Today's Vitals   06/09/18 1326 06/09/18 1330 06/09/18 1400 06/09/18 1430  BP:  (!) 160/74 (!) 152/75 (!) 147/66  Pulse:  90 88 90  Resp:  16  14  Temp:      TempSrc:      SpO2: 96% 99% 96% 95%  Weight:      Height:      PainSc:        Isolation Precautions No active isolations  Medications Medications  morphine (MS CONTIN) 12 hr tablet 30 mg (30 mg Oral Given 06/09/18 0933)  diltiazem (CARDIZEM CD) 24 hr capsule 180 mg (180 mg Oral Given 06/09/18 0936)  losartan (COZAAR) tablet 25 mg (25 mg Oral Given 06/09/18 0936)  pravastatin (PRAVACHOL) tablet 20 mg (has no administration in time range)  torsemide (DEMADEX) tablet 20 mg (20 mg Oral Given 06/09/18 0934)  nortriptyline (PAMELOR) capsule 25 mg (25 mg Oral Given 06/09/18 0200)  insulin glargine (LANTUS) injection 20 Units (20 Units Subcutaneous Given 06/09/18 0841)  levothyroxine (SYNTHROID, LEVOTHROID) tablet 88 mcg (88 mcg Oral Given 06/09/18 0803)  metoCLOPramide (REGLAN) tablet 10 mg (10 mg Oral Given 06/09/18 0807)  tiZANidine (ZANAFLEX) tablet 2-4 mg (has no administration in time range)  multivitamin with minerals tablet 1 tablet (1 tablet Oral Given 06/09/18 0934)  mometasone-formoterol  (DULERA) 200-5 MCG/ACT inhaler 2 puff (2 puffs Inhalation Given 06/09/18 0932)  clotrimazole-betamethasone (LOTRISONE) cream 1 application (has no administration in time range)  nystatin (MYCOSTATIN/NYSTOP) topical powder 1 g (has no administration in time range)  albuterol (PROVENTIL) (2.5 MG/3ML) 0.083% nebulizer solution 2.5 mg (has no administration in time range)  methylPREDNISolone sodium succinate (SOLU-MEDROL) 125 mg/2 mL injection 60 mg (60 mg Intravenous Given 06/09/18 1323)  dextromethorphan-guaiFENesin (MUCINEX DM) 30-600 MG per 12 hr tablet 1 tablet (has no administration in time range)  insulin aspart (novoLOG) injection 0-9 Units (0 Units Subcutaneous Not Given 06/09/18 1148)  azithromycin (ZITHROMAX) tablet 500 mg (500 mg Oral Given 06/09/18 0935)    Followed by  azithromycin (ZITHROMAX) tablet 250 mg (has no administration in time range)  ondansetron (ZOFRAN) injection 4 mg (has no administration in time range)  hydrALAZINE (APRESOLINE) injection 5 mg (has no administration in time range)  acetaminophen (TYLENOL) tablet 650 mg (has no administration in time range)  zolpidem (AMBIEN) tablet 5 mg (5 mg Oral Given 06/09/18 0159)  ipratropium-albuterol (DUONEB) 0.5-2.5 (3) MG/3ML nebulizer solution 3 mL (3 mLs Nebulization Given 06/09/18 1326)  acidophilus (RISAQUAD) capsule 1 capsule (1 capsule Oral Given 06/09/18 0934)  heparin injection 5,000 Units (has no administration in time range)  ipratropium-albuterol (DUONEB) 0.5-2.5 (3) MG/3ML nebulizer solution 3 mL (3  mLs Nebulization Given 06/08/18 1932)  ipratropium-albuterol (DUONEB) 0.5-2.5 (3) MG/3ML nebulizer solution 3 mL (3 mLs Nebulization Given 06/08/18 2315)  albuterol (PROVENTIL,VENTOLIN) solution continuous neb (10 mg/hr Nebulization Given 06/08/18 2315)  furosemide (LASIX) injection 60 mg (60 mg Intravenous Given 06/09/18 0159)    Mobility walks with device

## 2018-06-09 NOTE — ED Notes (Addendum)
Pt does not eat fish, and was given a new tray with beef.

## 2018-06-10 DIAGNOSIS — R0602 Shortness of breath: Secondary | ICD-10-CM | POA: Diagnosis present

## 2018-06-10 DIAGNOSIS — E1165 Type 2 diabetes mellitus with hyperglycemia: Secondary | ICD-10-CM | POA: Diagnosis present

## 2018-06-10 DIAGNOSIS — N183 Chronic kidney disease, stage 3 (moderate): Secondary | ICD-10-CM | POA: Diagnosis not present

## 2018-06-10 DIAGNOSIS — I130 Hypertensive heart and chronic kidney disease with heart failure and stage 1 through stage 4 chronic kidney disease, or unspecified chronic kidney disease: Secondary | ICD-10-CM | POA: Diagnosis not present

## 2018-06-10 DIAGNOSIS — F329 Major depressive disorder, single episode, unspecified: Secondary | ICD-10-CM | POA: Diagnosis present

## 2018-06-10 DIAGNOSIS — E785 Hyperlipidemia, unspecified: Secondary | ICD-10-CM | POA: Diagnosis present

## 2018-06-10 DIAGNOSIS — E1142 Type 2 diabetes mellitus with diabetic polyneuropathy: Secondary | ICD-10-CM | POA: Diagnosis present

## 2018-06-10 DIAGNOSIS — Q600 Renal agenesis, unilateral: Secondary | ICD-10-CM | POA: Diagnosis not present

## 2018-06-10 DIAGNOSIS — E1122 Type 2 diabetes mellitus with diabetic chronic kidney disease: Secondary | ICD-10-CM | POA: Diagnosis present

## 2018-06-10 DIAGNOSIS — J9621 Acute and chronic respiratory failure with hypoxia: Secondary | ICD-10-CM | POA: Diagnosis not present

## 2018-06-10 DIAGNOSIS — T380X5A Adverse effect of glucocorticoids and synthetic analogues, initial encounter: Secondary | ICD-10-CM | POA: Diagnosis present

## 2018-06-10 DIAGNOSIS — M797 Fibromyalgia: Secondary | ICD-10-CM | POA: Diagnosis not present

## 2018-06-10 DIAGNOSIS — G2581 Restless legs syndrome: Secondary | ICD-10-CM | POA: Diagnosis present

## 2018-06-10 DIAGNOSIS — Z9981 Dependence on supplemental oxygen: Secondary | ICD-10-CM | POA: Diagnosis not present

## 2018-06-10 DIAGNOSIS — I472 Ventricular tachycardia: Secondary | ICD-10-CM | POA: Diagnosis present

## 2018-06-10 DIAGNOSIS — M1990 Unspecified osteoarthritis, unspecified site: Secondary | ICD-10-CM | POA: Diagnosis present

## 2018-06-10 DIAGNOSIS — G4733 Obstructive sleep apnea (adult) (pediatric): Secondary | ICD-10-CM | POA: Diagnosis present

## 2018-06-10 DIAGNOSIS — R609 Edema, unspecified: Secondary | ICD-10-CM | POA: Diagnosis not present

## 2018-06-10 DIAGNOSIS — E039 Hypothyroidism, unspecified: Secondary | ICD-10-CM | POA: Diagnosis present

## 2018-06-10 DIAGNOSIS — D696 Thrombocytopenia, unspecified: Secondary | ICD-10-CM | POA: Diagnosis present

## 2018-06-10 DIAGNOSIS — J441 Chronic obstructive pulmonary disease with (acute) exacerbation: Secondary | ICD-10-CM | POA: Diagnosis not present

## 2018-06-10 DIAGNOSIS — I5033 Acute on chronic diastolic (congestive) heart failure: Secondary | ICD-10-CM | POA: Diagnosis not present

## 2018-06-10 DIAGNOSIS — G8929 Other chronic pain: Secondary | ICD-10-CM | POA: Diagnosis present

## 2018-06-10 DIAGNOSIS — Z6841 Body Mass Index (BMI) 40.0 and over, adult: Secondary | ICD-10-CM | POA: Diagnosis not present

## 2018-06-10 DIAGNOSIS — N179 Acute kidney failure, unspecified: Secondary | ICD-10-CM | POA: Diagnosis not present

## 2018-06-10 LAB — BASIC METABOLIC PANEL
Anion gap: 10 (ref 5–15)
Anion gap: 13 (ref 5–15)
BUN: 38 mg/dL — ABNORMAL HIGH (ref 8–23)
BUN: 49 mg/dL — ABNORMAL HIGH (ref 8–23)
CO2: 36 mmol/L — AB (ref 22–32)
CO2: 35 mmol/L — ABNORMAL HIGH (ref 22–32)
Calcium: 9.1 mg/dL (ref 8.9–10.3)
Calcium: 9.2 mg/dL (ref 8.9–10.3)
Chloride: 89 mmol/L — ABNORMAL LOW (ref 98–111)
Chloride: 87 mmol/L — ABNORMAL LOW (ref 98–111)
Creatinine, Ser: 1.24 mg/dL — ABNORMAL HIGH (ref 0.44–1.00)
Creatinine, Ser: 1.28 mg/dL — ABNORMAL HIGH (ref 0.44–1.00)
GFR calc Af Amer: 53 mL/min — ABNORMAL LOW (ref >60)
GFR calc Af Amer: 51 mL/min — ABNORMAL LOW (ref >60)
GFR calc non Af Amer: 46 mL/min — ABNORMAL LOW (ref >60)
GFR calc non Af Amer: 44 mL/min — ABNORMAL LOW (ref >60)
Glucose, Bld: 199 mg/dL — ABNORMAL HIGH (ref 70–99)
Glucose, Bld: 219 mg/dL — ABNORMAL HIGH (ref 70–99)
Potassium: 4.4 mmol/L (ref 3.5–5.1)
Potassium: 3.6 mmol/L (ref 3.5–5.1)
Sodium: 135 mmol/L (ref 135–145)
Sodium: 135 mmol/L (ref 135–145)

## 2018-06-10 LAB — CBC
HCT: 33.2 % — ABNORMAL LOW (ref 36.0–46.0)
Hemoglobin: 9.8 g/dL — ABNORMAL LOW (ref 12.0–15.0)
MCH: 26.4 pg (ref 26.0–34.0)
MCHC: 29.5 g/dL — ABNORMAL LOW (ref 30.0–36.0)
MCV: 89.5 fL (ref 80.0–100.0)
NRBC: 0 % (ref 0.0–0.2)
Platelets: 64 10*3/uL — ABNORMAL LOW (ref 150–400)
RBC: 3.71 MIL/uL — ABNORMAL LOW (ref 3.87–5.11)
RDW: 17.2 % — ABNORMAL HIGH (ref 11.5–15.5)
WBC: 4.6 10*3/uL (ref 4.0–10.5)

## 2018-06-10 LAB — HIV ANTIBODY (ROUTINE TESTING W REFLEX): HIV Screen 4th Generation wRfx: NONREACTIVE

## 2018-06-10 LAB — GLUCOSE, CAPILLARY
Glucose-Capillary: 213 mg/dL — ABNORMAL HIGH (ref 70–99)
Glucose-Capillary: 216 mg/dL — ABNORMAL HIGH (ref 70–99)
Glucose-Capillary: 256 mg/dL — ABNORMAL HIGH (ref 70–99)
Glucose-Capillary: 207 mg/dL — ABNORMAL HIGH (ref 70–99)

## 2018-06-10 LAB — MAGNESIUM: Magnesium: 2 mg/dL (ref 1.7–2.4)

## 2018-06-10 MED ORDER — FUROSEMIDE 10 MG/ML IJ SOLN
20.0000 mg | Freq: Once | INTRAMUSCULAR | Status: AC
  Administered 2018-06-10: 20 mg via INTRAVENOUS
  Filled 2018-06-10: qty 2

## 2018-06-10 NOTE — Progress Notes (Signed)
CMT reported pt had >20 beat run of v-tach/v-fib. Pt asymptomatic, VSS. On-call provider made aware. New orders placed for labs. Will continue to monitor closely.

## 2018-06-10 NOTE — Progress Notes (Signed)
Pt does not want to wear cpap tonight, prefers nasal cannula(3L).  Cpap machine remains in room on standby.  Pt encouraged to call should she changer her mind.  RN aware.

## 2018-06-10 NOTE — Plan of Care (Signed)
  Problem: Health Behavior/Discharge Planning: Goal: Ability to manage health-related needs will improve 06/10/2018 1023 by Meriel Pica, RN Outcome: Progressing 06/09/2018 2133 by Meriel Pica, RN Outcome: Progressing   Problem: Clinical Measurements: Goal: Ability to maintain clinical measurements within normal limits will improve 06/10/2018 1023 by Meriel Pica, RN Outcome: Progressing 06/09/2018 2133 by Meriel Pica, RN Outcome: Progressing Goal: Will remain free from infection 06/10/2018 1023 by Meriel Pica, RN Outcome: Progressing 06/09/2018 2133 by Meriel Pica, RN Outcome: Progressing Goal: Diagnostic test results will improve 06/10/2018 1023 by Meriel Pica, RN Outcome: Progressing 06/09/2018 2133 by Meriel Pica, RN Outcome: Progressing Goal: Respiratory complications will improve 06/10/2018 1023 by Meriel Pica, RN Outcome: Progressing 06/09/2018 2133 by Meriel Pica, RN Outcome: Progressing Goal: Cardiovascular complication will be avoided 06/10/2018 1023 by Meriel Pica, RN Outcome: Progressing 06/09/2018 2133 by Meriel Pica, RN Outcome: Progressing   Problem: Activity: Goal: Risk for activity intolerance will decrease Outcome: Progressing   Problem: Coping: Goal: Level of anxiety will decrease Outcome: Progressing   Problem: Elimination: Goal: Will not experience complications related to bowel motility Outcome: Progressing Goal: Will not experience complications related to urinary retention Outcome: Progressing   Problem: Education: Goal: Knowledge of disease or condition will improve Outcome: Progressing Goal: Knowledge of the prescribed therapeutic regimen will improve Outcome: Progressing   Problem: Activity: Goal: Ability to tolerate increased activity will improve Outcome: Progressing Goal: Will verbalize the importance of balancing activity with adequate rest periods Outcome: Progressing   Problem:  Respiratory: Goal: Ability to maintain a clear airway will improve Outcome: Progressing Goal: Levels of oxygenation will improve 06/10/2018 1023 by Meriel Pica, RN Outcome: Progressing 06/09/2018 2133 by Meriel Pica, RN Outcome: Progressing Goal: Ability to maintain adequate ventilation will improve 06/10/2018 1023 by Meriel Pica, RN Outcome: Progressing 06/09/2018 2133 by Meriel Pica, RN Outcome: Progressing

## 2018-06-10 NOTE — Progress Notes (Signed)
PROGRESS NOTE  Renee Noble DOB: Mar 17, 1956 DOA: 06/08/2018 PCP: [name], PA-C  HPI/Recap of past 24 hours: Renee Noble is a 63 y.o. female with medical history significant of solitary kidney, CKD stage III, hypertension, hyperlipidemia, diabetes mellitus, COPD on 3 L oxygen at home, CHF, HCV, OSA on CPAP, fibromyalgia, chronic back pain and neck pain, depression, hypothyroidism, who presents with shortness of breath.  Admitted for acute COPD exacerbation.  06/10/2018: Patient seen and examined at her bedside.  She reports persistent dyspnea with minimal exertion and persistent cough.  Assessment/Plan: Principal Problem:   Acute on chronic respiratory failure with hypoxia (HCC) Active Problems:   Type II diabetes mellitus with renal manifestations (HCC)   Essential hypertension   CKD (chronic kidney disease), stage III (HCC)   OSA (obstructive sleep apnea)   Acute on chronic diastolic CHF (congestive heart failure) (HCC)   HLD (hyperlipidemia)   Hypothyroidism   Depression   Fibromyalgia   COPD exacerbation (HCC)  Acute on chronic respiratory failure with hypoxia ([place]):  Likely secondary to acute COPD exacerbation O2 saturation continues to drop with minimal movement requiring more than her O2 supplementation at baseline 3 L  Independently reviewed chest x-ray done on admission which revealed increase in pulmonary vascularity with cardiomegaly.  Give 1 dose of IV Lasix 20 mg Maintain O2 saturation greater than 92% Obtain home O2 evaluation prior to discharge  Acute COPD exacerbation: -c/w Nebulizers: scheduled Duoneb and prn albuterol -c/w Dulera inhaler -c/w Solu-Medrol 60 mg IV tid -c/w Z pak -c/w Mucinex for cough  -Incentive spirometry -Blood cultures sputum culture and influenza A&B negative -On 3 L oxygen by nasal cannula continuously at baseline -Add pulmonary toilet hypersaline nebs  Acute on chronic diastolic CHF: 2D echo on 12/87/8676  showed EF 60-65%. -give lasix 60 mg by IV--> will need to reevaluate renal function before giving further IV diuretics. -continue home torsemide 20 mg daily -Give 1 dose of IV Lasix 20 mg   Type II diabetes mellitus with renal manifestations ([place]): Last A1c 5.2 on 04/09/18, well controled. Patient is taking Humalog and Lantus at home -will decrease Lantus dose from 30 to 20 units daily -SSI  Hypertension: -Continue Cozaar, Cardizem -IV hydralazine as needed  Improving AKI on CKD (chronic kidney disease), stage III [facility]):  Presented with creatinine 1.57 with baseline creatinine of 1.2 Creatinine improving to 1.40.  Repeat BMP in the morning net -510 cc since admission   OSA (obstructive sleep apnea): -CPAP  HLD (hyperlipidemia): -Pravastatin  Hypothyroidism: -Synthroid  Depression: -Continue nortriptyline  Fibromyalgia, chronic neck pain in the back pain: -Continue home MS Contin, Tizanidine     DVT ppx: SQ Lovenox daily Code Status: Full code Family Communication: None at bed side.   Disposition Plan: Discharge possibly tomorrow 06/11/2018 Consults called:  none   Objective: Vitals:   06/09/18 2052 06/10/18 0030 06/10/18 0503 06/10/18 0932  BP: (!) 157/73  135/85   Pulse: 95 87 84   Resp: 16 18 16    Temp: 97.9 F (36.6 C)  98 F (36.7 C)   TempSrc: Oral  Oral   SpO2: 93% 94% 96% 95%  Weight:      Height:        Intake/Output Summary (Last 24 hours) at 06/10/2018 1418 Last data filed at 06/10/2018 1352 Gross per 24 hour  Intake 480 ml  Output 1700 ml  Net -1220 ml   Filed Weights   06/09/18 0830 06/09/18 1552  Weight:  115 kg 122.7 kg    Exam:  . General: 63 y.o. year-old female well developed well nourished in no acute distress.  Alert and oriented x3. . Cardiovascular: Regular rate and rhythm with no rubs or gallops.  No thyromegaly or JVD noted.   [name] Kitchen Respiratory: Expiratory wheezes noted diffusely.  Good respiratory effort. . Abdomen:  Soft nontender nondistended with normal bowel sounds x4 quadrants. . Musculoskeletal: Trace lower extremity edema. 2/4 pulses in all 4 extremities. [name] Kitchen Psychiatry: Mood is appropriate for condition and setting   Data Reviewed: CBC: Recent Labs  Lab 06/09/18 0438 06/10/18 0729  WBC 4.4 4.6  HGB 9.2* 9.8*  HCT 32.1* 33.2*  MCV 91.5 89.5  PLT 54* 64*   Basic Metabolic Panel: Recent Labs  Lab 06/08/18 2115 06/09/18 1908 06/10/18 0729  NA 139 137 135  K 4.2 4.1 4.4  CL 94* 90* 89*  CO2 38* 35* 36*  GLUCOSE 121* 270* 199*  BUN 29* 36* 38*  CREATININE 1.57* 1.40* 1.24*  CALCIUM 8.9 8.7* 9.1   GFR: Estimated Creatinine Clearance: 59.3 mL/min (A) (by C-G formula based on SCr of 1.24 mg/dL (H)). Liver Function Tests: No results for input(s): AST, ALT, ALKPHOS, BILITOT, PROT, ALBUMIN in the last 168 hours. No results for input(s): LIPASE, AMYLASE in the last 168 hours. No results for input(s): AMMONIA in the last 168 hours. Coagulation Profile: No results for input(s): INR, PROTIME in the last 168 hours. Cardiac Enzymes: No results for input(s): CKTOTAL, CKMB, CKMBINDEX, TROPONINI in the last 168 hours. BNP (last 3 results) No results for input(s): PROBNP in the last 8760 hours. HbA1C: No results for input(s): HGBA1C in the last 72 hours. CBG: Recent Labs  Lab 06/09/18 1144 06/09/18 1648 06/09/18 2056 06/10/18 0737 06/10/18 1202  GLUCAP 88 236* 282* 213* 216*   Lipid Profile: No results for input(s): CHOL, HDL, LDLCALC, TRIG, CHOLHDL, LDLDIRECT in the last 72 hours. Thyroid Function Tests: No results for input(s): TSH, T4TOTAL, FREET4, T3FREE, THYROIDAB in the last 72 hours. Anemia Panel: No results for input(s): VITAMINB12, FOLATE, FERRITIN, TIBC, IRON, RETICCTPCT in the last 72 hours. Urine analysis:    Component Value Date/Time   COLORURINE YELLOW 04/07/2018 1820   APPEARANCEUR HAZY (A) 04/07/2018 1820   LABSPEC 1.015 04/07/2018 1820   PHURINE 6.0 04/07/2018  1820   GLUCOSEU NEGATIVE 04/07/2018 1820   GLUCOSEU NEGATIVE 08/17/2016 1340   HGBUR SMALL (A) 04/07/2018 1820   BILIRUBINUR NEGATIVE 04/07/2018 1820   BILIRUBINUR neg 01/15/2015 [place] 04/07/2018 1820   PROTEINUR 30 (A) 04/07/2018 1820   UROBILINOGEN 1.0 08/17/2016 1340   NITRITE NEGATIVE 04/07/2018 1820   LEUKOCYTESUR NEGATIVE 04/07/2018 1820   Sepsis Labs: @LABRCNTIP (procalcitonin:4,lacticidven:4)  ) Recent Results (from the past 240 hour(s))  Culture, blood (routine x 2) Call MD if unable to obtain prior to antibiotics being given     Status: None (Preliminary result)   Collection Time: 06/09/18  4:38 AM  Result Value Ref Range Status   Specimen Description   Final    BLOOD RIGHT HAND Performed at [facility], [place] [address], [place], [place] [address]    Special Requests   Final    BOTTLES DRAWN AEROBIC AND ANAEROBIC Blood Culture adequate volume Performed at [facility] [address], [place], [place] [address]    Culture   Final    NO GROWTH 1 DAY Performed at [facility], [place] [address] [place], [place] [address]  Report Status PENDING  Incomplete  Culture, blood (routine x 2) Call MD if unable to obtain prior to antibiotics being given     Status: None (Preliminary result)   Collection Time: 06/09/18  5:03 AM  Result Value Ref Range Status   Specimen Description   Final    BLOOD BLOOD RIGHT FOREARM Performed at [facility] [address], [place], [place] [address]    Special Requests   Final    BOTTLES DRAWN AEROBIC AND ANAEROBIC Blood Culture results may not be optimal due to an excessive volume of blood received in culture bottles Performed at [facility] [address], [place], [place] [address]    Culture   Final    NO GROWTH 1 DAY Performed at [facility], [place] [address], [place], [place] [address]    Report Status PENDING  Incomplete        Studies: No results found.  Scheduled Meds: . acidophilus  1 capsule Oral Daily  . azithromycin  250 mg Oral Daily  . diltiazem  180 mg Oral Daily  . heparin injection (subcutaneous)  5,000 Units Subcutaneous Q8H  . insulin aspart  0-9 Units Subcutaneous TID WC  . insulin glargine  20 Units Subcutaneous Daily  . ipratropium-albuterol  3 mL Nebulization TID  . levothyroxine  88 mcg Oral QAC breakfast  . losartan  25 mg Oral Daily  . methylPREDNISolone (SOLU-MEDROL) injection  60 mg Intravenous Q8H  . metoCLOPramide  10 mg Oral BID  . mometasone-formoterol  2 puff Inhalation BID  . morphine  30 mg Oral Q12H  . multivitamin with minerals  1 tablet Oral Daily  . nortriptyline  25 mg Oral QHS  . pravastatin  20 mg Oral q1800  . torsemide  20 mg Oral Daily    Continuous Infusions:   LOS: 0 days     Kayleen Memos, MD [facility] Pager [phone]  If 7PM-7AM, please contact night-coverage [email or web address] Password [email or web address] 06/10/2018, 2:18 PM

## 2018-06-11 LAB — CBC
HCT: 35.1 % — ABNORMAL LOW (ref 36.0–46.0)
Hemoglobin: 10.5 g/dL — ABNORMAL LOW (ref 12.0–15.0)
MCH: 26.2 pg (ref 26.0–34.0)
MCHC: 29.9 g/dL — ABNORMAL LOW (ref 30.0–36.0)
MCV: 87.5 fL (ref 80.0–100.0)
Platelets: 67 10*3/uL — ABNORMAL LOW (ref 150–400)
RBC: 4.01 MIL/uL (ref 3.87–5.11)
RDW: 17.1 % — ABNORMAL HIGH (ref 11.5–15.5)
WBC: 4.7 10*3/uL (ref 4.0–10.5)
nRBC: 0 % (ref 0.0–0.2)

## 2018-06-11 LAB — BASIC METABOLIC PANEL
Anion gap: 13 (ref 5–15)
BUN: 49 mg/dL — AB (ref 8–23)
CO2: 33 mmol/L — ABNORMAL HIGH (ref 22–32)
Calcium: 8.9 mg/dL (ref 8.9–10.3)
Chloride: 89 mmol/L — ABNORMAL LOW (ref 98–111)
Creatinine, Ser: 1.2 mg/dL — ABNORMAL HIGH (ref 0.44–1.00)
GFR calc Af Amer: 55 mL/min — ABNORMAL LOW (ref >60)
GFR calc non Af Amer: 48 mL/min — ABNORMAL LOW (ref >60)
Glucose, Bld: 215 mg/dL — ABNORMAL HIGH (ref 70–99)
POTASSIUM: 3.8 mmol/L (ref 3.5–5.1)
Sodium: 135 mmol/L (ref 135–145)

## 2018-06-11 LAB — GLUCOSE, CAPILLARY
GLUCOSE-CAPILLARY: 242 mg/dL — AB (ref 70–99)
Glucose-Capillary: 215 mg/dL — ABNORMAL HIGH (ref 70–99)
Glucose-Capillary: 220 mg/dL — ABNORMAL HIGH (ref 70–99)
Glucose-Capillary: 247 mg/dL — ABNORMAL HIGH (ref 70–99)

## 2018-06-11 MED ORDER — SODIUM CHLORIDE 3 % IN NEBU
4.0000 mL | INHALATION_SOLUTION | Freq: Three times a day (TID) | RESPIRATORY_TRACT | Status: AC
  Administered 2018-06-11 – 2018-06-14 (×7): 4 mL via RESPIRATORY_TRACT
  Filled 2018-06-11 (×8): qty 4

## 2018-06-11 MED ORDER — SENNA 8.6 MG PO TABS
1.0000 | ORAL_TABLET | Freq: Every day | ORAL | Status: DC | PRN
  Administered 2018-06-11 – 2018-06-13 (×3): 17.2 mg via ORAL
  Filled 2018-06-11 (×3): qty 2

## 2018-06-11 MED ORDER — GUAIFENESIN ER 600 MG PO TB12
1200.0000 mg | ORAL_TABLET | Freq: Two times a day (BID) | ORAL | Status: DC
  Administered 2018-06-11 – 2018-06-15 (×8): 1200 mg via ORAL
  Filled 2018-06-11 (×9): qty 2

## 2018-06-11 NOTE — Progress Notes (Signed)
Pt states she was unable to tolerate CPAP last night.  Pt states she will opt to wear just her nasal cannula tonight while sleeping.  Pt encouraged to contact RT should she change her mind.

## 2018-06-11 NOTE — Progress Notes (Signed)
PROGRESS NOTE  Renee Noble DJT:[id number] DOB: 1956-01-25 DOA: 06/08/2018 PCP: [name], PA-C  HPI/Recap of past 24 hours: Renee Noble is a 63 y.o. female with medical history significant of solitary kidney, CKD stage III, hypertension, hyperlipidemia, diabetes mellitus, COPD on 3 L oxygen at home, CHF, HCV, OSA on CPAP, fibromyalgia, chronic back pain and neck pain, depression, hypothyroidism, who presents with shortness of breath.  Admitted for acute COPD exacerbation.  06/10/2018: Patient seen and examined at her bedside.  She reports persistent dyspnea with minimal exertion and persistent cough.  06/11/2018: Patient seen and examined at bedside.  Episode of V. tach overnight, asymptomatic.  EKG ordered.  Reports persistent dyspnea and hypoxia with minimal exertion.  Assessment/Plan: Principal Problem:   Acute on chronic respiratory failure with hypoxia (HCC) Active Problems:   Type II diabetes mellitus with renal manifestations (HCC)   Essential hypertension   CKD (chronic kidney disease), stage III (HCC)   OSA (obstructive sleep apnea)   Acute on chronic diastolic CHF (congestive heart failure) (HCC)   HLD (hyperlipidemia)   Hypothyroidism   Depression   Fibromyalgia   COPD exacerbation (HCC)  Acute on chronic respiratory failure with hypoxia ([place]):  Likely secondary to acute COPD exacerbation O2 saturation continues to drop with minimal movement requiring more than her O2 supplementation at baseline 3 L  Independently reviewed chest x-ray done on admission which revealed increase in pulmonary vascularity with cardiomegaly.  Give 1 dose of IV Lasix 20 mg Maintain O2 saturation greater than 92% Obtain home O2 evaluation prior to discharge  Continue IV steroids, breathing treatments and intermittent IV Lasix Continue her home schedule diuretic Mobilize as tolerated Use flutter valve when awake every 4 hours  Asymptomatic V. tach Obtain twelve-lead EKG  today Continue to closely monitor on telemetry  Acute COPD exacerbation: -c/w Nebulizers: scheduled Duoneb and prn albuterol -c/w Dulera inhaler -c/w Solu-Medrol 60 mg IV tid -c/w Z pak -c/w Mucinex for cough  -Incentive spirometry -Blood cultures sputum culture and influenza A&B negative -On 3 L oxygen by nasal cannula continuously at baseline -Continue pulmonary toilet hypersaline nebs  Acute on chronic diastolic CHF: 2D echo on 30/01/2329 showed EF 60-65%. -give lasix 60 mg by IV--> will need to reevaluate renal function before giving further IV diuretics. -continue home torsemide 20 mg daily -Give 1 dose of IV Lasix 20 mg   Type II diabetes mellitus with renal manifestations ([place]): Last A1c 5.2 on 04/09/18, well controled. Patient is taking Humalog and Lantus at home -will decrease Lantus dose from 30 to 20 units daily -SSI  Solitary kidney Avoid nephrotoxic agents Closely monitor renal function  Hypertension: -Blood pressure stable Continue Cozaar, Cardizem -IV hydralazine as needed  Chronic thrombocytopenia Platelet 67K from 64K No sign of overt mucosal bleed  Improving AKI on CKD (chronic kidney disease), stage III [facility]):  Presented with creatinine 1.57 with baseline creatinine of 1.2 Creatinine improving to 1.20 from 1.28 from 1.40.  Repeat BMP in the morning net -510 cc since admission   OSA (obstructive sleep apnea): -CPAP  HLD (hyperlipidemia): -Pravastatin  Hypothyroidism: -Synthroid  Depression: -Continue nortriptyline  Fibromyalgia, chronic neck pain in the back pain: -Continue home MS Contin, Tizanidine     DVT ppx: SQ Lovenox daily Code Status: Full code Family Communication: None at bed side.   Disposition Plan: Discharge possibly tomorrow 06/11/2018 Consults called:  none   Objective: Vitals:   06/11/18 0455 06/11/18 0812 06/11/18 0918 06/11/18 1235  BP: 128/66  [name] Kitchen)  148/76 (!) 148/70  Pulse: 81  86 88  Resp: 16   16   Temp: 97.7 F (36.5 C)   98.1 F (36.7 C)  TempSrc: Oral   Oral  SpO2: 93% 97%  97%  Weight:      Height:        Intake/Output Summary (Last 24 hours) at 06/11/2018 1534 Last data filed at 06/11/2018 1056 Gross per 24 hour  Intake 480 ml  Output 3250 ml  Net -2770 ml   Filed Weights   06/09/18 0830 06/09/18 1552  Weight: 115 kg 122.7 kg    Exam:  . General: 63 y.o. year-old female well developed well-nourished in no acute distress.  Alert and oriented x3. . Cardiovascular: Regular rate and rhythm with no rubs or gallops.  No JVD or thyromegaly noted. [name] Kitchen Respiratory: Mild posterior expiratory wheezes with good inspiratory effort.   . Abdomen: Soft nontender nondistended with normal bowel sounds x4 quadrants. . Musculoskeletal: Trace lower extremity edema. 2/4 pulses in all 4 extremities. [name] Kitchen Psychiatry: Mood is appropriate for condition and setting   Data Reviewed: CBC: Recent Labs  Lab 06/09/18 0438 06/10/18 0729 06/11/18 0424  WBC 4.4 4.6 4.7  HGB 9.2* 9.8* 10.5*  HCT 32.1* 33.2* 35.1*  MCV 91.5 89.5 87.5  PLT 54* 64* 67*   Basic Metabolic Panel: Recent Labs  Lab 06/08/18 2115 06/09/18 1908 06/10/18 0729 06/10/18 2058 06/11/18 0424  NA 139 137 135 135 135  K 4.2 4.1 4.4 3.6 3.8  CL 94* 90* 89* 87* 89*  CO2 38* 35* 36* 35* 33*  GLUCOSE 121* 270* 199* 219* 215*  BUN 29* 36* 38* 49* 49*  CREATININE 1.57* 1.40* 1.24* 1.28* 1.20*  CALCIUM 8.9 8.7* 9.1 9.2 8.9  MG  --   --   --  2.0  --    GFR: Estimated Creatinine Clearance: 61.2 mL/min (A) (by C-G formula based on SCr of 1.2 mg/dL (H)). Liver Function Tests: No results for input(s): AST, ALT, ALKPHOS, BILITOT, PROT, ALBUMIN in the last 168 hours. No results for input(s): LIPASE, AMYLASE in the last 168 hours. No results for input(s): AMMONIA in the last 168 hours. Coagulation Profile: No results for input(s): INR, PROTIME in the last 168 hours. Cardiac Enzymes: No results for input(s): CKTOTAL, CKMB,  CKMBINDEX, TROPONINI in the last 168 hours. BNP (last 3 results) No results for input(s): PROBNP in the last 8760 hours. HbA1C: No results for input(s): HGBA1C in the last 72 hours. CBG: Recent Labs  Lab 06/10/18 1202 06/10/18 1615 06/10/18 2142 06/11/18 0729 06/11/18 1234  GLUCAP 216* 256* 207* 215* 220*   Lipid Profile: No results for input(s): CHOL, HDL, LDLCALC, TRIG, CHOLHDL, LDLDIRECT in the last 72 hours. Thyroid Function Tests: No results for input(s): TSH, T4TOTAL, FREET4, T3FREE, THYROIDAB in the last 72 hours. Anemia Panel: No results for input(s): VITAMINB12, FOLATE, FERRITIN, TIBC, IRON, RETICCTPCT in the last 72 hours. Urine analysis:    Component Value Date/Time   COLORURINE YELLOW 04/07/2018 1820   APPEARANCEUR HAZY (A) 04/07/2018 1820   LABSPEC 1.015 04/07/2018 1820   PHURINE 6.0 04/07/2018 1820   GLUCOSEU NEGATIVE 04/07/2018 1820   GLUCOSEU NEGATIVE 08/17/2016 1340   HGBUR SMALL (A) 04/07/2018 1820   BILIRUBINUR NEGATIVE 04/07/2018 1820   BILIRUBINUR neg 01/15/2015 [place] 04/07/2018 1820   PROTEINUR 30 (A) 04/07/2018 1820   UROBILINOGEN 1.0 08/17/2016 1340   NITRITE NEGATIVE 04/07/2018 1820   LEUKOCYTESUR NEGATIVE 04/07/2018 1820   Sepsis  Labs: @LABRCNTIP (procalcitonin:4,lacticidven:4)  ) Recent Results (from the past 240 hour(s))  Culture, blood (routine x 2) Call MD if unable to obtain prior to antibiotics being given     Status: None (Preliminary result)   Collection Time: 06/09/18  4:38 AM  Result Value Ref Range Status   Specimen Description   Final    BLOOD RIGHT HAND Performed at [facility] [address], [place], [place] [address]    Special Requests   Final    BOTTLES DRAWN AEROBIC AND ANAEROBIC Blood Culture adequate volume Performed at [facility] [address] [place], [place] [address]    Culture   Final    NO GROWTH 2 DAYS Performed at [facility] [address], [place], [place] [address]    Report Status PENDING  Incomplete  Culture, blood (routine x 2) Call MD if unable to obtain prior to antibiotics being given     Status: None (Preliminary result)   Collection Time: 06/09/18  5:03 AM  Result Value Ref Range Status   Specimen Description   Final    BLOOD BLOOD RIGHT FOREARM Performed at [facility] [address] [place], [place] [address]    Special Requests   Final    BOTTLES DRAWN AEROBIC AND ANAEROBIC Blood Culture results may not be optimal due to an excessive volume of blood received in culture bottles Performed at [facility] [address] [place], [place] [address]    Culture   Final    NO GROWTH 2 DAYS Performed at [facility] [address], [place],  [address]    Report Status PENDING  Incomplete      Studies: No results found.  Scheduled Meds: . acidophilus  1 capsule Oral Daily  . azithromycin  250 mg Oral Daily  . diltiazem  180 mg Oral Daily  . heparin injection (subcutaneous)  5,000 Units Subcutaneous Q8H  . insulin aspart  0-9 Units Subcutaneous TID WC  . insulin glargine  20 Units Subcutaneous Daily  . ipratropium-albuterol  3 mL Nebulization TID  . levothyroxine  88 mcg Oral QAC breakfast  . losartan  25 mg Oral Daily  . methylPREDNISolone (SOLU-MEDROL) injection  60 mg Intravenous Q8H  . metoCLOPramide  10 mg Oral BID  . mometasone-formoterol  2 puff Inhalation BID  . morphine  30 mg Oral Q12H  . multivitamin with minerals  1 tablet Oral Daily  . nortriptyline  25 mg Oral QHS  . pravastatin  20 mg Oral q1800  . torsemide  20 mg Oral Daily    Continuous Infusions:   LOS: 1 day     Kayleen Memos, MD [facility] Pager [phone]  If 7PM-7AM, please contact night-coverage [email or web address] Password [facility] 06/11/2018, 3:34 PM

## 2018-06-11 NOTE — Progress Notes (Signed)
Patient had another runs of V-tach nonsustained, patient in room resting in bed, denies any distress, vital 157/80, 92, 18, 92%-3L. Dr. [place] Crane notified, no new order given, will continue to assess patient.

## 2018-06-11 NOTE — Plan of Care (Signed)
  Problem: Health Behavior/Discharge Planning: Goal: Ability to manage health-related needs will improve Outcome: Progressing   Problem: Clinical Measurements: Goal: Ability to maintain clinical measurements within normal limits will improve Outcome: Progressing Goal: Will remain free from infection Outcome: Progressing Goal: Diagnostic test results will improve Outcome: Progressing Goal: Respiratory complications will improve Outcome: Progressing Goal: Cardiovascular complication will be avoided Outcome: Progressing   Problem: Activity: Goal: Risk for activity intolerance will decrease Outcome: Progressing   Problem: Coping: Goal: Level of anxiety will decrease Outcome: Progressing   Problem: Pain Managment: Goal: General experience of comfort will improve Outcome: Progressing   Problem: Education: Goal: Knowledge of disease or condition will improve Outcome: Progressing Goal: Knowledge of the prescribed therapeutic regimen will improve Outcome: Progressing   Problem: Activity: Goal: Ability to tolerate increased activity will improve Outcome: Progressing Goal: Will verbalize the importance of balancing activity with adequate rest periods Outcome: Progressing   Problem: Respiratory: Goal: Ability to maintain a clear airway will improve Outcome: Progressing Goal: Levels of oxygenation will improve Outcome: Progressing Goal: Ability to maintain adequate ventilation will improve Outcome: Progressing

## 2018-06-12 LAB — BASIC METABOLIC PANEL
Anion gap: 11 (ref 5–15)
BUN: 60 mg/dL — AB (ref 8–23)
CO2: 35 mmol/L — ABNORMAL HIGH (ref 22–32)
Calcium: 8.6 mg/dL — ABNORMAL LOW (ref 8.9–10.3)
Chloride: 89 mmol/L — ABNORMAL LOW (ref 98–111)
Creatinine, Ser: 1.46 mg/dL — ABNORMAL HIGH (ref 0.44–1.00)
GFR calc Af Amer: 44 mL/min — ABNORMAL LOW (ref >60)
GFR, EST NON AFRICAN AMERICAN: 38 mL/min — AB (ref >60)
Glucose, Bld: 220 mg/dL — ABNORMAL HIGH (ref 70–99)
Potassium: 4.1 mmol/L (ref 3.5–5.1)
SODIUM: 135 mmol/L (ref 135–145)

## 2018-06-12 LAB — CBC
HCT: 35.7 % — ABNORMAL LOW (ref 36.0–46.0)
Hemoglobin: 10.5 g/dL — ABNORMAL LOW (ref 12.0–15.0)
MCH: 25.8 pg — ABNORMAL LOW (ref 26.0–34.0)
MCHC: 29.4 g/dL — ABNORMAL LOW (ref 30.0–36.0)
MCV: 87.7 fL (ref 80.0–100.0)
Platelets: 70 10*3/uL — ABNORMAL LOW (ref 150–400)
RBC: 4.07 MIL/uL (ref 3.87–5.11)
RDW: 16.7 % — ABNORMAL HIGH (ref 11.5–15.5)
WBC: 4.3 10*3/uL (ref 4.0–10.5)
nRBC: 0 % (ref 0.0–0.2)

## 2018-06-12 LAB — GLUCOSE, CAPILLARY
Glucose-Capillary: 198 mg/dL — ABNORMAL HIGH (ref 70–99)
Glucose-Capillary: 208 mg/dL — ABNORMAL HIGH (ref 70–99)
Glucose-Capillary: 262 mg/dL — ABNORMAL HIGH (ref 70–99)
Glucose-Capillary: 265 mg/dL — ABNORMAL HIGH (ref 70–99)

## 2018-06-12 LAB — HEMOGLOBIN A1C
Hgb A1c MFr Bld: 5.6 % (ref 4.8–5.6)
Mean Plasma Glucose: 114.02 mg/dL

## 2018-06-12 NOTE — Progress Notes (Signed)
Pt refused CPAP again tonight.  Pt encouraged to contact RT should she change her mind.

## 2018-06-12 NOTE — Progress Notes (Signed)
PROGRESS NOTE  Renee Noble JME:[id number] DOB: 1955-07-21 DOA: 06/08/2018 PCP: [name], PA-C  HPI/Recap of past 24 hours: Renee Noble is a 63 y.o. female with medical history significant of solitary kidney, CKD stage III, hypertension, hyperlipidemia, diabetes mellitus, COPD on 3 L oxygen at home, CHF, HCV, OSA on CPAP, fibromyalgia, chronic back pain and neck pain, depression, hypothyroidism, who presents with shortness of breath.  Admitted for acute COPD exacerbation.  06/10/2018: Persistent dyspnea with minimal exertion and persistent cough. 06/11/2018: Episode of V. tach overnight, asymptomatic.    06/12/2018: Patient seen and examined at her bedside.  No acute events overnight.  Could not tolerate CPAP.  Still dyspnea with minimal exertion.   Assessment/Plan: Principal Problem:   Acute on chronic respiratory failure with hypoxia (HCC) Active Problems:   Type II diabetes mellitus with renal manifestations (HCC)   Essential hypertension   CKD (chronic kidney disease), stage III (HCC)   OSA (obstructive sleep apnea)   Acute on chronic diastolic CHF (congestive heart failure) (HCC)   HLD (hyperlipidemia)   Hypothyroidism   Depression   Fibromyalgia   COPD exacerbation (HCC)  Acute on chronic respiratory failure with hypoxia ([place]):  Likely secondary to acute COPD exacerbation O2 saturation continues to drop with minimal movement requiring more than her O2 supplementation at baseline 3 L  Independently reviewed chest x-ray done on admission which revealed increase in pulmonary vascularity with cardiomegaly.  Give 1 dose of IV Lasix 20 mg Maintain O2 saturation greater than 92% Obtain home O2 evaluation prior to discharge  Continue IV steroids, breathing treatments and intermittent IV Lasix Continue her home schedule diuretic Mobilize as tolerated Use flutter valve when awake every 4 hours  Improving AKI on CKD (chronic kidney disease), stage III ([place]) in the  setting of solitary kidney:  Presented with creatinine 1.57 with baseline creatinine of 1.2 Worsening creatinine 1.46 from 1.23 yesterday Net -5.8 L since admission Avoid nephrotoxic agent Repeat BMP tomorrow  Asymptomatic V. tach Obtain twelve-lead EKG today Continue to closely monitor on telemetry  Acute COPD exacerbation: -c/w Nebulizers: scheduled Duoneb and prn albuterol -c/w Dulera inhaler -c/w Solu-Medrol 60 mg IV tid -c/w Z pak -c/w Mucinex for cough  -Incentive spirometry -Blood cultures sputum culture and influenza A&B negative -On 3 L oxygen by nasal cannula continuously at baseline -Continue pulmonary toilet hypersaline nebs  Acute on chronic diastolic CHF: 2D echo on 22/97/9892 showed EF 60-65%. -give lasix 60 mg by IV--> will need to reevaluate renal function before giving further IV diuretics. -continue home torsemide 20 mg daily -Give 1 dose of IV Lasix 20 mg   Type II diabetes mellitus with renal manifestations ([place]): Last A1c 5.2 on 04/09/18, well controled. Patient is taking Humalog and Lantus at home -will decrease Lantus dose from 30 to 20 units daily -SSI  Solitary kidney Avoid nephrotoxic agents Closely monitor renal function  Hypertension: -Blood pressure stable Continue Cozaar, Cardizem -IV hydralazine as needed  Chronic thrombocytopenia Platelet 67K from 64K No sign of overt mucosal bleed  OSA (obstructive sleep apnea): -CPAP  HLD (hyperlipidemia): -Pravastatin  Hypothyroidism: -Synthroid  Depression: -Continue nortriptyline  Fibromyalgia, chronic neck pain in the back pain: -Continue home MS Contin, Tizanidine     DVT ppx: SQ Lovenox daily Code Status: Full code Family Communication: None at bed side.   Disposition Plan: Discharge possibly tomorrow 06/13/2018 Consults called:  none   Objective: Vitals:   06/12/18 0419 06/12/18 0900 06/12/18 0954 06/12/18 1419  BP:  140/86  113/82   Pulse: 82     Resp: 18       Temp: 97.7 F (36.5 C)     TempSrc: Oral     SpO2: 96% 92%  96%  Weight:      Height:        Intake/Output Summary (Last 24 hours) at 06/12/2018 1506 Last data filed at 06/12/2018 0520 Gross per 24 hour  Intake -  Output 1900 ml  Net -1900 ml   Filed Weights   06/09/18 0830 06/09/18 1552  Weight: 115 kg 122.7 kg    Exam:  . General: 63 y.o. year-old female well-developed well-nourished in no acute distress.  Alert and oriented x3. . Cardiovascular: Regular Rate and Rhythm with No Rubs or Gallops.  No JVD or Thyromegaly Noted. [name] Kitchen Respiratory: Clear to auscultation with no wheezes or rales.  Good inspiratory effort. . Abdomen: Soft nontender nondistended with normal bowel sounds x4 quadrants. . Musculoskeletal: Trace lower extremity edema. 2/4 pulses in all 4 extremities. [name] Kitchen Psychiatry: Mood is appropriate for condition and setting   Data Reviewed: CBC: Recent Labs  Lab 06/09/18 0438 06/10/18 0729 06/11/18 0424 06/12/18 0410  WBC 4.4 4.6 4.7 4.3  HGB 9.2* 9.8* 10.5* 10.5*  HCT 32.1* 33.2* 35.1* 35.7*  MCV 91.5 89.5 87.5 87.7  PLT 54* 64* 67* 70*   Basic Metabolic Panel: Recent Labs  Lab 06/09/18 1908 06/10/18 0729 06/10/18 2058 06/11/18 0424 06/12/18 0410  NA 137 135 135 135 135  K 4.1 4.4 3.6 3.8 4.1  CL 90* 89* 87* 89* 89*  CO2 35* 36* 35* 33* 35*  GLUCOSE 270* 199* 219* 215* 220*  BUN 36* 38* 49* 49* 60*  CREATININE 1.40* 1.24* 1.28* 1.20* 1.46*  CALCIUM 8.7* 9.1 9.2 8.9 8.6*  MG  --   --  2.0  --   --    GFR: Estimated Creatinine Clearance: 50.3 mL/min (A) (by C-G formula based on SCr of 1.46 mg/dL (H)). Liver Function Tests: No results for input(s): AST, ALT, ALKPHOS, BILITOT, PROT, ALBUMIN in the last 168 hours. No results for input(s): LIPASE, AMYLASE in the last 168 hours. No results for input(s): AMMONIA in the last 168 hours. Coagulation Profile: No results for input(s): INR, PROTIME in the last 168 hours. Cardiac Enzymes: No results for  input(s): CKTOTAL, CKMB, CKMBINDEX, TROPONINI in the last 168 hours. BNP (last 3 results) No results for input(s): PROBNP in the last 8760 hours. HbA1C: Recent Labs    06/12/18 0410  HGBA1C 5.6   CBG: Recent Labs  Lab 06/11/18 1234 06/11/18 1606 06/11/18 2153 06/12/18 0753 06/12/18 1141  GLUCAP 220* 247* 242* 198* 208*   Lipid Profile: No results for input(s): CHOL, HDL, LDLCALC, TRIG, CHOLHDL, LDLDIRECT in the last 72 hours. Thyroid Function Tests: No results for input(s): TSH, T4TOTAL, FREET4, T3FREE, THYROIDAB in the last 72 hours. Anemia Panel: No results for input(s): VITAMINB12, FOLATE, FERRITIN, TIBC, IRON, RETICCTPCT in the last 72 hours. Urine analysis:    Component Value Date/Time   COLORURINE YELLOW 04/07/2018 1820   APPEARANCEUR HAZY (A) 04/07/2018 1820   LABSPEC 1.015 04/07/2018 1820   PHURINE 6.0 04/07/2018 1820   GLUCOSEU NEGATIVE 04/07/2018 1820   GLUCOSEU NEGATIVE 08/17/2016 1340   HGBUR SMALL (A) 04/07/2018 1820   BILIRUBINUR NEGATIVE 04/07/2018 1820   BILIRUBINUR neg 01/15/2015 [place] 04/07/2018 1820   PROTEINUR 30 (A) 04/07/2018 1820   UROBILINOGEN 1.0 08/17/2016 1340   NITRITE NEGATIVE 04/07/2018 1820  LEUKOCYTESUR NEGATIVE 04/07/2018 1820   Sepsis Labs: @LABRCNTIP (procalcitonin:4,lacticidven:4)  ) Recent Results (from the past 240 hour(s))  Culture, blood (routine x 2) Call MD if unable to obtain prior to antibiotics being given     Status: None (Preliminary result)   Collection Time: 06/09/18  4:38 AM  Result Value Ref Range Status   Specimen Description   Final    BLOOD RIGHT HAND Performed at [facility] [address], [place], [place] [address]    Special Requests   Final    BOTTLES DRAWN AEROBIC AND ANAEROBIC Blood Culture adequate volume Performed at [facility] [address] [place], [place] [address]    Culture   Final    NO GROWTH 3 DAYS Performed at [facility], [place] [address] [place], [place] [address]    Report Status PENDING  Incomplete  Culture, blood (routine x 2) Call MD if unable to obtain prior to antibiotics being given     Status: None (Preliminary result)   Collection Time: 06/09/18  5:03 AM  Result Value Ref Range Status   Specimen Description   Final    BLOOD BLOOD RIGHT FOREARM Performed at [facility] [address] [place], [place] [address]    Special Requests   Final    BOTTLES DRAWN AEROBIC AND ANAEROBIC Blood Culture results may not be optimal due to an excessive volume of blood received in culture bottles Performed at [facility] [address] [place], [place] [address]    Culture   Final    NO GROWTH 3 DAYS Performed at [facility], [place] [address], [place], [place] [address]    Report Status PENDING  Incomplete      Studies: No results found.  Scheduled Meds: . acidophilus  1 capsule Oral Daily  . azithromycin  250 mg Oral Daily  . diltiazem  180 mg Oral Daily  . guaiFENesin  1,200 mg Oral BID  . heparin injection (subcutaneous)  5,000 Units Subcutaneous Q8H  . insulin aspart  0-9 Units Subcutaneous TID WC  . insulin glargine  20 Units Subcutaneous Daily  . ipratropium-albuterol  3 mL Nebulization TID  . levothyroxine  88 mcg Oral QAC breakfast  . losartan  25 mg Oral Daily  . methylPREDNISolone (SOLU-MEDROL) injection  60 mg Intravenous Q8H  . metoCLOPramide  10 mg Oral BID  . mometasone-formoterol  2 puff Inhalation BID  . morphine  30 mg Oral Q12H  . multivitamin with minerals  1 tablet Oral Daily  . nortriptyline  25 mg Oral QHS  . pravastatin  20 mg Oral q1800  . sodium chloride HYPERTONIC  4 mL Nebulization TID  . torsemide  20 mg Oral Daily    Continuous Infusions:   LOS: 2 days     Kayleen Memos, MD [facility] Pager [phone]  If 7PM-7AM, please contact night-coverage [email or web address] Password [facility] 06/12/2018, 3:06 PM

## 2018-06-12 NOTE — Plan of Care (Signed)
  Problem: Health Behavior/Discharge Planning: Goal: Ability to manage health-related needs will improve Outcome: Progressing   Problem: Clinical Measurements: Goal: Ability to maintain clinical measurements within normal limits will improve Outcome: Progressing Goal: Will remain free from infection Outcome: Progressing Goal: Diagnostic test results will improve Outcome: Progressing Goal: Respiratory complications will improve Outcome: Progressing Goal: Cardiovascular complication will be avoided Outcome: Progressing   Problem: Activity: Goal: Risk for activity intolerance will decrease Outcome: Progressing   Problem: Coping: Goal: Level of anxiety will decrease Outcome: Progressing   Problem: Pain Managment: Goal: General experience of comfort will improve Outcome: Progressing   Problem: Education: Goal: Knowledge of disease or condition will improve Outcome: Progressing Goal: Knowledge of the prescribed therapeutic regimen will improve Outcome: Progressing   Problem: Activity: Goal: Ability to tolerate increased activity will improve Outcome: Progressing Goal: Will verbalize the importance of balancing activity with adequate rest periods Outcome: Progressing   Problem: Respiratory: Goal: Ability to maintain a clear airway will improve Outcome: Progressing Goal: Levels of oxygenation will improve Outcome: Progressing Goal: Ability to maintain adequate ventilation will improve Outcome: Progressing

## 2018-06-12 NOTE — Progress Notes (Signed)
Inpatient Diabetes Program Recommendations  AACE/ADA: New Consensus Statement on Inpatient Glycemic Control (2015)  Target Ranges:  Prepandial:   less than 140 mg/dL      Peak postprandial:   less than 180 mg/dL (1-2 hours)      Critically ill patients:  140 - 180 mg/dL   Lab Results  Component Value Date   GLUCAP 208 (H) 06/12/2018   HGBA1C 5.6 06/12/2018    Review of Glycemic Control  Diabetes history: DM2 Outpatient Diabetes medications: Lantus 30 units QD, Humalog 15 units tidwc Current orders for Inpatient glycemic control: Lantus 20 units QD, Novolog 0-9units tidwc  On Solumedrol 60 mg Q8H. HgbA1C - 5.6%.  Inpatient Diabetes Program Recommendations:     Add Novolog 6 units tidwc for meal coverage insulin if pt eats > 50% meal.  Continue to follow.  Thank you. Renee Noble, RD, LDN, CDE Inpatient Diabetes Coordinator [phone]

## 2018-06-12 NOTE — Progress Notes (Signed)
SATURATION QUALIFICATIONS: (This note is used to comply with regulatory documentation for home oxygen)  Patient Saturations on Room Air at Rest = 81% on home oxygen already 3L   Patient Saturations on Room Air while Ambulating = 81% on baseline 3L at home  Patient Saturations on 3 Liters of oxygen while Ambulating = 81%  Please briefly explain why patient needs home oxygen: With activity on 3L patient's SAT is in the 80s, pumped her up to 4L and up to the 90% with activity but at rest back up to the 92%-3L

## 2018-06-13 ENCOUNTER — Inpatient Hospital Stay (HOSPITAL_COMMUNITY): Payer: Medicare Other

## 2018-06-13 DIAGNOSIS — R609 Edema, unspecified: Secondary | ICD-10-CM

## 2018-06-13 LAB — CBC
HCT: 37 % (ref 36.0–46.0)
HEMOGLOBIN: 10.9 g/dL — AB (ref 12.0–15.0)
MCH: 26.2 pg (ref 26.0–34.0)
MCHC: 29.5 g/dL — ABNORMAL LOW (ref 30.0–36.0)
MCV: 88.9 fL (ref 80.0–100.0)
Platelets: 70 10*3/uL — ABNORMAL LOW (ref 150–400)
RBC: 4.16 MIL/uL (ref 3.87–5.11)
RDW: 16.6 % — ABNORMAL HIGH (ref 11.5–15.5)
WBC: 4.7 10*3/uL (ref 4.0–10.5)
nRBC: 0 % (ref 0.0–0.2)

## 2018-06-13 LAB — BASIC METABOLIC PANEL
Anion gap: 12 (ref 5–15)
BUN: 66 mg/dL — ABNORMAL HIGH (ref 8–23)
CO2: 35 mmol/L — ABNORMAL HIGH (ref 22–32)
Calcium: 8.4 mg/dL — ABNORMAL LOW (ref 8.9–10.3)
Chloride: 87 mmol/L — ABNORMAL LOW (ref 98–111)
Creatinine, Ser: 1.52 mg/dL — ABNORMAL HIGH (ref 0.44–1.00)
GFR calc Af Amer: 42 mL/min — ABNORMAL LOW (ref >60)
GFR calc non Af Amer: 36 mL/min — ABNORMAL LOW (ref >60)
Glucose, Bld: 269 mg/dL — ABNORMAL HIGH (ref 70–99)
POTASSIUM: 3.8 mmol/L (ref 3.5–5.1)
Sodium: 134 mmol/L — ABNORMAL LOW (ref 135–145)

## 2018-06-13 LAB — GLUCOSE, CAPILLARY
GLUCOSE-CAPILLARY: 214 mg/dL — AB (ref 70–99)
Glucose-Capillary: 224 mg/dL — ABNORMAL HIGH (ref 70–99)
Glucose-Capillary: 250 mg/dL — ABNORMAL HIGH (ref 70–99)
Glucose-Capillary: 249 mg/dL — ABNORMAL HIGH (ref 70–99)

## 2018-06-13 LAB — D-DIMER, QUANTITATIVE: D-Dimer, Quant: 2.04 ug/mL-FEU — ABNORMAL HIGH (ref 0.00–0.50)

## 2018-06-13 MED ORDER — INSULIN ASPART 100 UNIT/ML ~~LOC~~ SOLN
6.0000 [IU] | Freq: Three times a day (TID) | SUBCUTANEOUS | Status: DC
  Administered 2018-06-13 – 2018-06-15 (×8): 6 [IU] via SUBCUTANEOUS

## 2018-06-13 NOTE — Care Management Important Message (Signed)
Important Message  Patient Details  Name: Renee Noble MRN: [id number] Date of Birth: Mar 03, 1956   Medicare Important Message Given:  Yes    [name] 06/13/2018, 12:57 [place] Message  Patient Details  Name: Renee Noble MRN: [id number] Date of Birth: 03/22/1956   Medicare Important Message Given:  Yes    [name] 06/13/2018, 12:57 PM

## 2018-06-13 NOTE — Progress Notes (Signed)
Bilateral lower extremity venous duplex has been completed. Preliminary results can be found in CV Proc through chart review.   06/13/18 12:00 PM Renee Noble RVT

## 2018-06-13 NOTE — Progress Notes (Signed)
Pt refused CPAP again tonight.  Pt states that she has done fine without it the last couple nights and wishes to sleep without it again since she is unable to tolerate our machine.

## 2018-06-13 NOTE — Progress Notes (Signed)
PROGRESS NOTE  Renee Noble EHM:[id number] DOB: 1955-11-30 DOA: 06/08/2018 PCP: [name], PA-C  HPI/Recap of past 24 hours: Renee Noble is a 63 y.o. female with medical history significant of morbid obesity, solitary kidney, CKD stage III, hypertension, hyperlipidemia, diabetes mellitus, COPD on 3 L oxygen at home, chronic diastolic CHF, HCV, OSA on CPAP, fibromyalgia, chronic back pain and neck pain, depression, hypothyroidism, who presents with shortness of breath.  Admitted for acute on chronic hypoxic respiratory failure secondary to acute COPD exacerbation.  Hospital course complicated by persistent hypoxia requiring higher level of oxygen above her baseline of 3 L continuously despite aggressive treatment.  Patient follows with Dr. Melvyn Novas her pulmonologist.  06/13/2018: Patient seen and examined at her bedside.  No acute events overnight.  Continues to require significant amount of oxygen with ambulation and minimal exertion.   Assessment/Plan: Principal Problem:   Acute on chronic respiratory failure with hypoxia (HCC) Active Problems:   Type II diabetes mellitus with renal manifestations (HCC)   Essential hypertension   CKD (chronic kidney disease), stage III (HCC)   OSA (obstructive sleep apnea)   Acute on chronic diastolic CHF (congestive heart failure) (HCC)   HLD (hyperlipidemia)   Hypothyroidism   Depression   Fibromyalgia   COPD exacerbation (HCC)  Acute on chronic respiratory failure with hypoxia ([place]):  Likely secondary to acute COPD exacerbation Independently reviewed chest x-ray done on admission which revealed increase in pulmonary vascularity with cardiomegaly.   Maintain O2 saturation greater than 92% Obtain home O2 evaluation prior to discharge  Continue IV steroids, on the clock breathing treatments Mobilize as tolerated Use flutter valve when awake every 4 hours  AKI on CKD (chronic kidney disease), stage III (HCC) in the setting of solitary  kidney:  Presented with creatinine 1.57 with baseline creatinine of 1.2 Worsening creatinine 1.52 from 1.46 from 1.23 Net -9.2 L since admission Stop home oral Lasix Avoid nephrotoxic agents Repeat BMP in the morning Continue to monitor urine output  Resolved asymptomatic V. tach Continue to closely monitor on telemetry  Acute COPD exacerbation: -c/w Nebulizers: scheduled Duoneb and prn albuterol -c/w Dulera inhaler -c/w Solu-Medrol 60 mg IV tid -c/w Z pak -c/w Mucinex for cough  -Incentive spirometry -Blood cultures sputum culture and influenza A&B negative -On 3 L oxygen by nasal cannula continuously at baseline -Continue pulmonary toilet hypersaline nebs  Acute on chronic diastolic CHF:  -2D echo on 04/08/2018 showed EF 60-65%. -Stop diuretics -Continue strict I's and O's and daily weight -Bilateral lower extremity duplex ultrasound negative for DVT in the setting of edema  Type II diabetes mellitus with renal manifestations ([place]):  Last A1c 5.2 on 04/09/18, well controled.  Patient is taking Humalog and Lantus at home -will decrease Lantus dose from 30 to 20 units daily -SSI  Solitary kidney Avoid nephrotoxic agents Closely monitor renal function urine output  Hypertension: -Blood pressure stable Continue Cozaar, Cardizem -IV hydralazine as needed  Chronic thrombocytopenia Platelet count stable 70 K from 70 K No sign of overt mucosal bleed  OSA (obstructive sleep apnea): -CPAP -Has not been able to tolerate CPAP here  HLD (hyperlipidemia): -Continue pravastatin  Hypothyroidism: -Continue Synthroid  Depression: -Continue nortriptyline  Fibromyalgia, chronic neck pain in the back pain: -Continue home MS Contin, Tizanidine     DVT ppx: SQ Lovenox daily Code Status: Full code Family Communication: None at bed side.   Disposition Plan: Discharge to home possibly tomorrow 06/14/2018 Consults called:  none   Objective:  Vitals:    06/13/18 0454 06/13/18 0745 06/13/18 1333 06/13/18 1420  BP: (!) 145/76  (!) 153/89   Pulse: 82  91   Resp: 18  18   Temp: 97.6 F (36.4 C)  98.4 F (36.9 C)   TempSrc: Oral  Oral   SpO2: 96% 95% 91% 92%  Weight:      Height:        Intake/Output Summary (Last 24 hours) at 06/13/2018 1921 Last data filed at 06/13/2018 1743 Gross per 24 hour  Intake 840 ml  Output 2875 ml  Net -2035 ml   Filed Weights   06/09/18 0830 06/09/18 1552  Weight: 115 kg 122.7 kg    Exam:  . General: 63 y.o. year-old female obese in no acute distress.  Alert and oriented x3.  . Cardiovascular: Regular rate and rhythm with no rubs or gallops.  No JVD or thyromegaly noted.  Respiratory: Mild expiratory wheezes on left posterior lobes.  Good inspiratory effort. . Abdomen: Soft nontender nondistended with normal bowel sounds x4 quadrants. . Musculoskeletal: Trace lower extremity edema. 2/4 pulses in all 4 extremities. [name] Kitchen Psychiatry: Mood is appropriate for condition and setting   Data Reviewed: CBC: Recent Labs  Lab 06/09/18 0438 06/10/18 0729 06/11/18 0424 06/12/18 0410 06/13/18 0431  WBC 4.4 4.6 4.7 4.3 4.7  HGB 9.2* 9.8* 10.5* 10.5* 10.9*  HCT 32.1* 33.2* 35.1* 35.7* 37.0  MCV 91.5 89.5 87.5 87.7 88.9  PLT 54* 64* 67* 70* 70*   Basic Metabolic Panel: Recent Labs  Lab 06/10/18 0729 06/10/18 2058 06/11/18 0424 06/12/18 0410 06/13/18 0431  NA 135 135 135 135 134*  K 4.4 3.6 3.8 4.1 3.8  CL 89* 87* 89* 89* 87*  CO2 36* 35* 33* 35* 35*  GLUCOSE 199* 219* 215* 220* 269*  BUN 38* 49* 49* 60* 66*  CREATININE 1.24* 1.28* 1.20* 1.46* 1.52*  CALCIUM 9.1 9.2 8.9 8.6* 8.4*  MG  --  2.0  --   --   --    GFR: Estimated Creatinine Clearance: 48.3 mL/min (A) (by C-G formula based on SCr of 1.52 mg/dL (H)). Liver Function Tests: No results for input(s): AST, ALT, ALKPHOS, BILITOT, PROT, ALBUMIN in the last 168 hours. No results for input(s): LIPASE, AMYLASE in the last 168 hours. No results  for input(s): AMMONIA in the last 168 hours. Coagulation Profile: No results for input(s): INR, PROTIME in the last 168 hours. Cardiac Enzymes: No results for input(s): CKTOTAL, CKMB, CKMBINDEX, TROPONINI in the last 168 hours. BNP (last 3 results) No results for input(s): PROBNP in the last 8760 hours. HbA1C: Recent Labs    06/12/18 0410  HGBA1C 5.6   CBG: Recent Labs  Lab 06/12/18 1655 06/12/18 2050 06/13/18 0741 06/13/18 1210 06/13/18 1700  GLUCAP 262* 265* 224* 250* 214*   Lipid Profile: No results for input(s): CHOL, HDL, LDLCALC, TRIG, CHOLHDL, LDLDIRECT in the last 72 hours. Thyroid Function Tests: No results for input(s): TSH, T4TOTAL, FREET4, T3FREE, THYROIDAB in the last 72 hours. Anemia Panel: No results for input(s): VITAMINB12, FOLATE, FERRITIN, TIBC, IRON, RETICCTPCT in the last 72 hours. Urine analysis:    Component Value Date/Time   COLORURINE YELLOW 04/07/2018 1820   APPEARANCEUR HAZY (A) 04/07/2018 1820   LABSPEC 1.015 04/07/2018 1820   PHURINE 6.0 04/07/2018 1820   GLUCOSEU NEGATIVE 04/07/2018 1820   GLUCOSEU NEGATIVE 08/17/2016 1340   HGBUR SMALL (A) 04/07/2018 1820   BILIRUBINUR NEGATIVE 04/07/2018 1820   BILIRUBINUR neg 01/15/2015 1353  KETONESUR NEGATIVE 04/07/2018 1820   PROTEINUR 30 (A) 04/07/2018 1820   UROBILINOGEN 1.0 08/17/2016 1340   NITRITE NEGATIVE 04/07/2018 1820   LEUKOCYTESUR NEGATIVE 04/07/2018 1820   Sepsis Labs: @LABRCNTIP (procalcitonin:4,lacticidven:4)  ) Recent Results (from the past 240 hour(s))  Culture, blood (routine x 2) Call MD if unable to obtain prior to antibiotics being given     Status: None (Preliminary result)   Collection Time: 06/09/18  4:38 AM  Result Value Ref Range Status   Specimen Description   Final    BLOOD RIGHT HAND Performed at [facility], [place] [address], [place], [place] [address]    Special Requests   Final    BOTTLES DRAWN AEROBIC AND ANAEROBIC Blood Culture adequate  volume Performed at [facility] [address], [place], [place] [address]    Culture   Final    NO GROWTH 4 DAYS Performed at [facility], [place] [address], [place], [place] [address]    Report Status PENDING  Incomplete  Culture, blood (routine x 2) Call MD if unable to obtain prior to antibiotics being given     Status: None (Preliminary result)   Collection Time: 06/09/18  5:03 AM  Result Value Ref Range Status   Specimen Description   Final    BLOOD BLOOD RIGHT FOREARM Performed at [facility] [address], [place], [place] [address]    Special Requests   Final    BOTTLES DRAWN AEROBIC AND ANAEROBIC Blood Culture results may not be optimal due to an excessive volume of blood received in culture bottles Performed at [facility] [address], [place],  [address]    Culture   Final    NO GROWTH 4 DAYS Performed at [facility], [place] [address], [place],  [address]    Report Status PENDING  Incomplete      Studies: Vas [place] Lower Extremity Venous (dvt)  Result Date: 06/13/2018  Lower Venous Study Indications: Edema.  Limitations: Body habitus and poor ultrasound/tissue interface. Performing Technologist: Oliver Hum RVT  Examination Guidelines: A complete evaluation includes B-mode imaging, spectral Doppler, color Doppler, and power Doppler as needed of all accessible portions of each vessel. Bilateral testing is considered an integral part of a complete examination. Limited examinations for reoccurring indications may be performed as noted.  Right Venous Findings: +---------+---------------+---------+-----------+----------+--------------+          CompressibilityPhasicitySpontaneityPropertiesSummary        +---------+---------------+---------+-----------+----------+--------------+ CFV      Full           Yes      Yes                                  +---------+---------------+---------+-----------+----------+--------------+ SFJ      Full                                                        +---------+---------------+---------+-----------+----------+--------------+ FV Prox  Full                                                        +---------+---------------+---------+-----------+----------+--------------+  FV Mid   Full                                                        +---------+---------------+---------+-----------+----------+--------------+ FV DistalFull                                                        +---------+---------------+---------+-----------+----------+--------------+ PFV      Full           Yes      Yes                                 +---------+---------------+---------+-----------+----------+--------------+ POP      Full                                                        +---------+---------------+---------+-----------+----------+--------------+ PTV      Full                                                        +---------+---------------+---------+-----------+----------+--------------+ PERO                                                  Not visualized +---------+---------------+---------+-----------+----------+--------------+  Left Venous Findings: +---------+---------------+---------+-----------+----------+--------------+          CompressibilityPhasicitySpontaneityPropertiesSummary        +---------+---------------+---------+-----------+----------+--------------+ CFV      Full           Yes      Yes                                 +---------+---------------+---------+-----------+----------+--------------+ SFJ      Full                                                        +---------+---------------+---------+-----------+----------+--------------+ FV Prox  Full                                                         +---------+---------------+---------+-----------+----------+--------------+ FV Mid   Full                                                        +---------+---------------+---------+-----------+----------+--------------+  FV DistalFull                                                        +---------+---------------+---------+-----------+----------+--------------+ PFV      Full                                                        +---------+---------------+---------+-----------+----------+--------------+ POP      Full           Yes      Yes                                 +---------+---------------+---------+-----------+----------+--------------+ PTV      Full                                                        +---------+---------------+---------+-----------+----------+--------------+ PERO                                                  Not visualized +---------+---------------+---------+-----------+----------+--------------+    Summary: Right: There is no evidence of deep vein thrombosis in the lower extremity. However, portions of this examination were limited- see technologist comments above. No cystic structure found in the popliteal fossa. Left: There is no evidence of deep vein thrombosis in the lower extremity. However, portions of this examination were limited- see technologist comments above. No cystic structure found in the popliteal fossa.  *See table(s) above for measurements and observations. Electronically signed by Deitra Mayo MD on 06/13/2018 at 5:05:33 PM.    Final     Scheduled Meds: . acidophilus  1 capsule Oral Daily  . diltiazem  180 mg Oral Daily  . guaiFENesin  1,200 mg Oral BID  . heparin injection (subcutaneous)  5,000 Units Subcutaneous Q8H  . insulin aspart  0-9 Units Subcutaneous TID WC  . insulin aspart  6 Units Subcutaneous TID WC  . insulin glargine  20 Units Subcutaneous Daily  . ipratropium-albuterol  3 mL Nebulization  TID  . levothyroxine  88 mcg Oral QAC breakfast  . methylPREDNISolone (SOLU-MEDROL) injection  60 mg Intravenous Q8H  . metoCLOPramide  10 mg Oral BID  . mometasone-formoterol  2 puff Inhalation BID  . morphine  30 mg Oral Q12H  . multivitamin with minerals  1 tablet Oral Daily  . nortriptyline  25 mg Oral QHS  . pravastatin  20 mg Oral q1800  . sodium chloride HYPERTONIC  4 mL Nebulization TID    Continuous Infusions:   LOS: 3 days     Kayleen Memos, MD [facility] Pager [phone]  If 7PM-7AM, please contact night-coverage [email or web address] Password [facility] 06/13/2018, 7:21 PM

## 2018-06-13 NOTE — Evaluation (Signed)
Physical Therapy Evaluation Patient Details Name: Renee Noble MRN: [id number] DOB: 1955-11-29 Today's Date: 06/13/2018   History of Present Illness  63 y.o. female admitted on 06/10/18 for increased DOE, decreased O2 sats.  Pt dx with acute on chronic repiratory failure with hypoxia/COPD exacerbation, AKI on CKD (in the setting of solitary kidney), asymptomatic V-tach, and acute on chronic diastolic CHF.  Pt with significant PMH of RLS, neuropathy, morbid obesity, fibromyalgia, DM, cervical compression fx, and HTN.  Clinical Impression  Pt with limited endurance for mobility (short boughts around the room) with decreased sats despite very little activity (down to 81% on 3 L O2 [place] requiring ~ 5 mins to recover before going again).  She was active with home therapy PTA and reports she knows she needs a RW, but is afraid of the cost.  She would really benefit from a rollator as she gives out so quickly and needs to sit down.   PT to follow acutely for deficits listed below.      Follow Up Recommendations Home health PT;Other (comment)(reports she is active with [facility] for HHPT PTA)    Equipment Recommendations  Rolling walker with 5" wheels;Other (comment)(4 wheeled RW-bariatric (wants to know how much $$))    Recommendations for Other Services   NA    Precautions / Restrictions Precautions Precautions: Fall      Mobility  Bed Mobility Overal bed mobility: Modified Independent                Transfers Overall transfer level: Needs assistance Equipment used: None Transfers: Sit to/from Stand Sit to Stand: Supervision         General transfer comment: close supervision for safety  Ambulation/Gait Ambulation/Gait assistance: Min guard Gait Distance (Feet): 15 Feet(x2 with 5 min seated rest break) Assistive device: None Gait Pattern/deviations: Step-through pattern;Wide base of support     General Gait Details: Pt touching furniture for stability, reports she has  fallen at home when L knee gives out unexpectedly (two times in the past month).  O2 sats decrease to 81% on 3 L O2 [place] during short distance gait around her room.  It takes ~5 mins of no talking and pursed lip breathing to increase sats back to 90%.          Balance Overall balance assessment: Needs assistance Sitting-balance support: Feet supported;No upper extremity supported Sitting balance-Leahy Scale: Good     Standing balance support: Single extremity supported Standing balance-Leahy Scale: Poor Standing balance comment: needs at least one hand supported for balance in standing.                              Pertinent Vitals/Pain Pain Assessment: No/denies pain    Home Living Family/patient expects to be discharged to:: Private residence Living Arrangements: Non-relatives/Friends Available Help at Discharge: Family;Available 24 hours/day Type of Home: House Home Access: Stairs to enter Entrance Stairs-Rails: Left Entrance Stairs-Number of Steps: 4 Home Layout: One level Home Equipment: Cane - quad;Shower seat;Grab bars - tub/shower Additional Comments: uses 3 L O2 [place] at baseline    Prior Function Level of Independence: Independent         Comments: household and short distanced community ambulator.  uses cane at times, reports she needs a RW, but cannot afford to buy one.     Hand Dominance   Dominant Hand: Right    Extremity/Trunk Assessment   Upper Extremity Assessment Upper Extremity Assessment: Generalized  weakness    Lower Extremity Assessment Lower Extremity Assessment: Generalized weakness    Cervical / Trunk Assessment Cervical / Trunk Assessment: Other exceptions Cervical / Trunk Exceptions: h/o cervical compression fx  Communication   Communication: No difficulties  Cognition Arousal/Alertness: Awake/alert Behavior During Therapy: WFL for tasks assessed/performed Overall Cognitive Status: Within Functional Limits for tasks  assessed                                               Assessment/Plan    PT Assessment Patient needs continued PT services  PT Problem List Decreased strength;Decreased activity tolerance;Decreased balance;Decreased mobility;Decreased knowledge of use of DME;Cardiopulmonary status limiting activity;Obesity       PT Treatment Interventions DME instruction;Gait training;Stair training;Functional mobility training;Therapeutic activities;Therapeutic exercise;Balance training;Patient/family education    PT Goals (Current goals can be found in the Care Plan section)  Acute Rehab PT Goals Patient Stated Goal: to maintain her level of mobility and independence, to not have to increase her O2 levels PT Goal Formulation: With patient Time For Goal Achievement: 06/27/18 Potential to Achieve Goals: Good    Frequency Min 3X/week           AM-PAC PT "6 Clicks" Mobility  Outcome Measure Help needed turning from your back to your side while in a flat bed without using bedrails?: None Help needed moving from lying on your back to sitting on the side of a flat bed without using bedrails?: None Help needed moving to and from a bed to a chair (including a wheelchair)?: None Help needed standing up from a chair using your arms (e.g., wheelchair or bedside chair)?: A Little Help needed to walk in hospital room?: A Little Help needed climbing 3-5 steps with a railing? : A Little 6 Click Score: 21    End of Session Equipment Utilized During Treatment: Oxygen(3 L O2 [place]) Activity Tolerance: Other (comment)(limited by DOE/decreased O2 sats) Patient left: with call bell/phone within reach;in bed   PT Visit Diagnosis: Difficulty in walking, not elsewhere classified (R26.2);Muscle weakness (generalized) (M62.81);Repeated falls (R29.6)    Time: 4680-3212 PT Time Calculation (min) (ACUTE ONLY): 28 min   Charges:          Wells Guiles B. Dawon Troop, PT, DPT  Acute  Rehabilitation [phone] pager #[phone] office   PT Evaluation $PT Eval Moderate Complexity: 1 Mod PT Treatments $Gait Training: 8-22 mins        06/13/2018, 5:17 PM

## 2018-06-14 LAB — CULTURE, BLOOD (ROUTINE X 2)
Culture: NO GROWTH
Culture: NO GROWTH
Special Requests: ADEQUATE

## 2018-06-14 LAB — CBC
HCT: 38.5 % (ref 36.0–46.0)
Hemoglobin: 11.5 g/dL — ABNORMAL LOW (ref 12.0–15.0)
MCH: 26.4 pg (ref 26.0–34.0)
MCHC: 29.9 g/dL — ABNORMAL LOW (ref 30.0–36.0)
MCV: 88.5 fL (ref 80.0–100.0)
Platelets: 74 10*3/uL — ABNORMAL LOW (ref 150–400)
RBC: 4.35 MIL/uL (ref 3.87–5.11)
RDW: 16.3 % — ABNORMAL HIGH (ref 11.5–15.5)
WBC: 5.7 10*3/uL (ref 4.0–10.5)
nRBC: 0 % (ref 0.0–0.2)

## 2018-06-14 LAB — GLUCOSE, CAPILLARY
GLUCOSE-CAPILLARY: 150 mg/dL — AB (ref 70–99)
Glucose-Capillary: 223 mg/dL — ABNORMAL HIGH (ref 70–99)
Glucose-Capillary: 273 mg/dL — ABNORMAL HIGH (ref 70–99)
Glucose-Capillary: 214 mg/dL — ABNORMAL HIGH (ref 70–99)

## 2018-06-14 LAB — BASIC METABOLIC PANEL
ANION GAP: 10 (ref 5–15)
BUN: 56 mg/dL — ABNORMAL HIGH (ref 8–23)
CO2: 34 mmol/L — ABNORMAL HIGH (ref 22–32)
Calcium: 8.6 mg/dL — ABNORMAL LOW (ref 8.9–10.3)
Chloride: 90 mmol/L — ABNORMAL LOW (ref 98–111)
Creatinine, Ser: 1.28 mg/dL — ABNORMAL HIGH (ref 0.44–1.00)
GFR calc non Af Amer: 44 mL/min — ABNORMAL LOW (ref >60)
GFR, EST AFRICAN AMERICAN: 51 mL/min — AB (ref >60)
Glucose, Bld: 236 mg/dL — ABNORMAL HIGH (ref 70–99)
Potassium: 4.2 mmol/L (ref 3.5–5.1)
Sodium: 134 mmol/L — ABNORMAL LOW (ref 135–145)

## 2018-06-14 MED ORDER — INSULIN GLARGINE 100 UNIT/ML ~~LOC~~ SOLN
24.0000 [IU] | Freq: Every day | SUBCUTANEOUS | Status: DC
  Administered 2018-06-15: 24 [IU] via SUBCUTANEOUS
  Filled 2018-06-14: qty 0.24

## 2018-06-14 MED ORDER — METHYLPREDNISOLONE SODIUM SUCC 125 MG IJ SOLR
60.0000 mg | Freq: Two times a day (BID) | INTRAMUSCULAR | Status: DC
  Administered 2018-06-14 – 2018-06-15 (×2): 60 mg via INTRAVENOUS
  Filled 2018-06-14 (×2): qty 2

## 2018-06-14 MED ORDER — INSULIN ASPART 100 UNIT/ML ~~LOC~~ SOLN
0.0000 [IU] | Freq: Three times a day (TID) | SUBCUTANEOUS | Status: DC
  Administered 2018-06-14: 7 [IU] via SUBCUTANEOUS
  Administered 2018-06-14: 11 [IU] via SUBCUTANEOUS
  Administered 2018-06-15: 4 [IU] via SUBCUTANEOUS
  Administered 2018-06-15: 7 [IU] via SUBCUTANEOUS

## 2018-06-14 MED ORDER — HYDRALAZINE HCL 20 MG/ML IJ SOLN
5.0000 mg | INTRAMUSCULAR | Status: DC | PRN
  Administered 2018-06-14: 5 mg via INTRAVENOUS
  Filled 2018-06-14: qty 1

## 2018-06-14 NOTE — Progress Notes (Signed)
Pt continues to refuse cpap stating it doesn't fit/feel like her mask/machine at home.  Pt prefers to wear nasal cannula tonight.  Cpap machine not found in room.  Pt was encouraged to call should she change her mind.

## 2018-06-14 NOTE — Care Management Note (Signed)
Case Management Note  Patient Details  Name: Renee Noble MRN: [id number] Date of Birth: 30-Oct-1955  Subjective/Objective:                    Action/Plan:Plan to discharge home with [facility].    Expected Discharge Date:  (unknown)               Expected Discharge Plan:  [place]  In-House Referral:     Discharge planning Services  CM Consult  Post Acute Care Choice:  Durable Medical Equipment Choice offered to:  Patient  DME Arranged:  Walker rolling with seat DME Agency:  [place]:  RN, PT, Nurse's Aide [place] Agency:  [facility] (now [facility])  Status of Service:  Completed, signed off  If discussed at [employer] of Stay Meetings, dates discussed:    Additional CommentsPurcell Mouton, RN 06/14/2018, 10:35 AM

## 2018-06-14 NOTE — Progress Notes (Signed)
Inpatient Diabetes Program Recommendations  AACE/ADA: New Consensus Statement on Inpatient Glycemic Control (2015)  Target Ranges:  Prepandial:   less than 140 mg/dL      Peak postprandial:   less than 180 mg/dL (1-2 hours)      Critically ill patients:  140 - 180 mg/dL   Lab Results  Component Value Date   GLUCAP 273 (H) 06/14/2018   HGBA1C 5.6 06/12/2018    Review of Glycemic Control  Blood sugars > 180 mg/dL. Needs insulin adjustment.  Inpatient Diabetes Program Recommendations:    Increase Lantus to 25 units QAM Increase Novolog to 8 units tidwc for meal coverage insulin. Add HS correction  Will continue to follow.  Thank you. Lorenda Peck, RD, LDN, CDE Inpatient Diabetes Coordinator [phone]

## 2018-06-14 NOTE — Evaluation (Signed)
Occupational Therapy Evaluation Patient Details Name: Renee Noble MRN: [id number] DOB: 09-09-1955 Today's Date: 06/14/2018    History of Present Illness 63 y.o. female admitted on 06/10/18 for increased DOE, decreased O2 sats.  Pt dx with acute on chronic repiratory failure with hypoxia/COPD exacerbation, AKI on CKD (in the setting of solitary kidney), asymptomatic V-tach, and acute on chronic diastolic CHF.  Pt with significant PMH of RLS, neuropathy, morbid obesity, fibromyalgia, DM, cervical compression fx, and HTN.   Clinical Impression   Pt admitted with COPD exacerbation. Pt currently with functional limitations due to the deficits listed below (see OT Problem List).  Pt will benefit from skilled OT to increase their safety and independence with ADL and functional mobility for ADL to facilitate discharge to venue listed below.      Follow Up Recommendations  Home health OT;Supervision - Intermittent    Equipment Recommendations  Tub/shower bench    Recommendations for Other Services       Precautions / Restrictions Precautions Precautions: Fall Precaution Comments: home O2      3 lts  Restrictions Weight Bearing Restrictions: No      Mobility Bed Mobility Overal bed mobility: Modified Independent             General bed mobility comments: increased time  Transfers   Equipment used: None Transfers: Sit to/from [facility] Sit to Stand: Min guard Stand pivot transfers: Min assist       General transfer comment: good safety cognition and use of hands to steady self    Balance Overall balance assessment: Needs assistance Sitting-balance support: Feet supported;No upper extremity supported Sitting balance-Leahy Scale: Good     Standing balance support: Single extremity supported Standing balance-Leahy Scale: Poor Standing balance comment: needs at least one hand supported for balance in standing.                             ADL either performed or assessed with clinical judgement   ADL Overall ADL's : Needs assistance/impaired Eating/Feeding: Set up;Sitting   Grooming: Set up;Sitting   Upper Body Bathing: Set up;Sitting   Lower Body Bathing: Minimal assistance;Sit to/from stand;Cueing for sequencing;Cueing for safety   Upper Body Dressing : Set up;Sitting   Lower Body Dressing: Minimal assistance;Sit to/from stand;Cueing for sequencing;Cueing for safety   Toilet Transfer: Minimal assistance;BSC;RW;Stand-pivot   Toileting- Clothing Manipulation and Hygiene: Minimal assistance;Sit to/from stand;Cueing for sequencing;Cueing for safety         General ADL Comments: Pt fatigued quickly.  Educated in energy conservation. Hand provided.  Would benefit from further education,. Pt would like a tub bench     Vision Patient Visual Report: No change from baseline              Pertinent Vitals/Pain Pain Assessment: No/denies pain     Hand Dominance Right   Extremity/Trunk Assessment         Cervical / Trunk Assessment Cervical / Trunk Assessment: Other exceptions Cervical / Trunk Exceptions: h/o cervical compression fx   Communication Communication Communication: No difficulties   Cognition Arousal/Alertness: Awake/alert Behavior During Therapy: WFL for tasks assessed/performed Overall Cognitive Status: Within Functional Limits for tasks assessed  Home Living Family/patient expects to be discharged to:: Private residence Living Arrangements: Non-relatives/Friends Available Help at Discharge: Family;Available 24 hours/day Type of Home: House Home Access: Stairs to enter [employer] of Steps: 4 Entrance Stairs-Rails: Left Home Layout: One level     Bathroom Shower/Tub: Teacher, early years/pre: Standard     Home Equipment: Cane - quad;Shower seat;Grab bars - tub/shower   Additional Comments:  uses 3 L O2 Renee Noble at baseline      Prior Functioning/Environment Level of Independence: Independent        Comments: household and short distanced community ambulator.  uses cane at times, reports she needs a RW, but cannot afford to buy one.        OT Problem List: Decreased strength;Decreased activity tolerance;Cardiopulmonary status limiting activity;Decreased knowledge of use of DME or AE      OT Treatment/Interventions: Patient/family education;Self-care/ADL training;Energy conservation    OT Goals(Current goals can be found in the care plan section) Acute Rehab OT Goals Patient Stated Goal: to maintain her level of mobility and independence, to not have to increase her O2 levels OT Goal Formulation: With patient Time For Goal Achievement: 06/21/18 Potential to Achieve Goals: Good  OT Frequency: Min 2X/week    AM-PAC OT "6 Clicks" Daily Activity     Outcome Measure Help from another person eating meals?: None Help from another person taking care of personal grooming?: None Help from another person toileting, which includes using toliet, bedpan, or urinal?: A Little Help from another person bathing (including washing, rinsing, drying)?: A Little Help from another person to put on and taking off regular upper body clothing?: None Help from another person to put on and taking off regular lower body clothing?: A Little 6 Click Score: 21   End of Session Equipment Utilized During Treatment: Rolling walker Nurse Communication: Mobility status  Activity Tolerance: Patient limited by fatigue Patient left: with call bell/phone within reach;with bed alarm set  OT Visit Diagnosis: Unsteadiness on feet (R26.81)                Time: 1130-1146 OT Time Calculation (min): 16 min Charges:  OT General Charges $OT Visit: 1 Visit OT Evaluation $OT Eval Low Complexity: 1 Low  Renee Noble, OT Acute Rehabilitation Services [phone] Office- [phone]     Renee Noble,  Edwena Felty D 06/14/2018, 3:35 PM

## 2018-06-14 NOTE — Progress Notes (Signed)
Physical Therapy Treatment Patient Details Name: Renee Noble MRN: [id number] DOB: 11-18-1955 Today's Date: 06/14/2018    History of Present Illness 63 y.o. female admitted on 06/10/18 for increased DOE, decreased O2 sats.  Pt dx with acute on chronic repiratory failure with hypoxia/COPD exacerbation, AKI on CKD (in the setting of solitary kidney), asymptomatic V-tach, and acute on chronic diastolic CHF.  Pt with significant PMH of RLS, neuropathy, morbid obesity, fibromyalgia, DM, cervical compression fx, and HTN.    PT Comments    Assisted OOB to amb to bathroom.  Pt able to self perform hygiene.  Assisted with amb a greater distance in hallway using 4WW such that pt could sit when needed.  At rest pt uses 3 lts and with activity pt required 4 lts.     Follow Up Recommendations  Home health PT     Equipment Recommendations  Other (comment)(4WW)    Recommendations for Other Services       Precautions / Restrictions Precautions Precautions: Fall Precaution Comments: home O2      3 lts  Restrictions Weight Bearing Restrictions: No    Mobility  Bed Mobility Overal bed mobility: Modified Independent             General bed mobility comments: increased time  Transfers   Equipment used: None Transfers: Sit to/from Stand Sit to Stand: Supervision         General transfer comment: good safety cognition and use of hands to steady self  Ambulation/Gait Ambulation/Gait assistance: Supervision Gait Distance (Feet): 200 Feet(100 feet x 2 one seated rest break) Assistive device: 4-wheeled walker Gait Pattern/deviations: Step-through pattern     General Gait Details: pt tolerated an increased distance but required 4 lts to achieve sats > 89%.  Required an extnded seated rest break with 2/4 dyspnea.   Stairs             Wheelchair Mobility    Modified Rankin (Stroke Patients Only)       Balance                                             Cognition Arousal/Alertness: Awake/alert Behavior During Therapy: WFL for tasks assessed/performed Overall Cognitive Status: Within Functional Limits for tasks assessed                                        Exercises      General Comments        Pertinent Vitals/Pain Pain Assessment: No/denies pain    Home Living                      Prior Function            PT Goals (current goals can now be found in the care plan section) Progress towards PT goals: Progressing toward goals    Frequency    Min 3X/week      PT Plan Current plan remains appropriate    Co-evaluation              AM-PAC PT "6 Clicks" Mobility   Outcome Measure  Help needed turning from your back to your side while in a flat bed without using bedrails?: None Help needed moving from lying on your back to  sitting on the side of a flat bed without using bedrails?: None Help needed moving to and from a bed to a chair (including a wheelchair)?: None Help needed standing up from a chair using your arms (e.g., wheelchair or bedside chair)?: A Little Help needed to walk in hospital room?: A Little Help needed climbing 3-5 steps with a railing? : A Little 6 Click Score: 21    End of Session Equipment Utilized During Treatment: Oxygen Activity Tolerance: Patient limited by fatigue Patient left: in bed;with call bell/phone within reach Nurse Communication: Mobility status PT Visit Diagnosis: Difficulty in walking, not elsewhere classified (R26.2);Muscle weakness (generalized) (M62.81);Repeated falls (R29.6)     Time: 1030-1056 PT Time Calculation (min) (ACUTE ONLY): 26 min  Charges:  $Gait Training: 8-22 mins $Therapeutic Activity: 8-22 mins                     Rica Koyanagi  PTA Acute  Rehabilitation Services Pager      [phone] Office      [phone]

## 2018-06-14 NOTE — Progress Notes (Signed)
PROGRESS NOTE    Renee Noble  BMW:[id number] DOB: 1955/08/19 DOA: 06/08/2018 PCP: [name], PA-C    Brief Narrative: Renee Noble Emigis a 63 y.o.femalewith medical history significant ofmorbid obesity, solitary kidney, CKD stage III, hypertension, hyperlipidemia, diabetes mellitus, COPD on 3 L oxygen at home, chronic diastolic CHF, HCV, OSA on CPAP, fibromyalgia, chronic back pain and neck pain, depression, hypothyroidism, who presents with shortness of breath.  Admitted for acute on chronic hypoxic respiratory failure secondary to acute COPD exacerbation.  Hospital course complicated by persistent hypoxia requiring higher level of oxygen above her baseline of 3 L continuously despite aggressive treatment.  Patient follows with Dr. Melvyn Novas her pulmonologist.  Assessment & Plan:   Principal Problem:   Acute on chronic respiratory failure with hypoxia ([place]) Active Problems:   Type II diabetes mellitus with renal manifestations ([place])   Essential hypertension   CKD (chronic kidney disease), stage III (H[facility])   OSA (obstructive sleep apnea)   Acute on chronic diastolic CHF (congestive heart failure) (HCC)   HLD (hyperlipidemia)   Hypothyroidism   Depression   Fibromyalgia   COPD exacerbation (H[facility])   Acute on chronic respiratory failure with hypoxia probably secondary to acute COPD exacerbation She is back to 3 L of nasal cannula oxygen but on ambulation she is requiring up to 5 L of nasal cannula oxygen. Keep sats greater than 90%.  Evaluate home oxygen requirement prior to discharge.  As her wheezing has improved we will decrease the IV steroids to every 12.  Continue with duo nebs and flutter valve. Blood cultures, sputum cultures and flu PCR is negative. Continue with incentive spirometry and Mucinex.  Patient appears to have completed Z-Pak.  Asymptomatic nonsustained V. tach Resolved.   Acute kidney injury on stage III CKD in the setting of solitary kidney Diuresed  about liters since admission. Creatinine stable around 1.2 which appears to be her baseline   Hypertension Well-controlled continue with Cardizem and Cozaar   Type 2 diabetes mellitus with hyperglycemia Not well controlled probably secondary to steroid use. Change to resistant sliding scale and increase Lantus   Obstructive sleep apnea Patient has not been able to tolerate CPAP in the hospital.   History of chronic thrombocytopenia Platelets stable and no signs of bleeding   Hypothyroidism Continue with Synthroid   Hyperlipidemia Continue with statin  History of solitary kidney  Monitor BMP.   History of fibromyalgia with chronic neck pain and back pain Continue with home dose of MS Contin   Depression  continue with nortriptyline         DVT prophylaxis: Lovenox Code Status: Full code Family Communication: None at bedside Disposition Plan: Possible discharge in the morning.  Consultants:   None  Procedures: None Antimicrobials:  Subjective: Patient reports that she does not feel she is back to baseline at this time though her breathing has improved.  She remains hypoxic on 3 L on ambulation requiring an increased oxygen requirement.  Objective: Vitals:   06/14/18 0516 06/14/18 0637 06/14/18 0716 06/14/18 1246  BP: (!) 159/107 (!) 148/105  (!) 144/73  Pulse: 86 87  95  Resp: 20   20  Temp: 97.6 F (36.4 C)   98.2 F (36.8 C)  TempSrc: Oral   Oral  SpO2: 95% 96% 98% 95%  Weight:      Height:        Intake/Output Summary (Last 24 hours) at 06/14/2018 1410 Last data filed at 06/14/2018 0900 Gross per 24 hour  Intake 830 ml  Output 900 ml  Net -70 ml   Filed Weights   06/09/18 0830 06/09/18 1552  Weight: 115 kg 122.7 kg    Examination:  General exam: Mild distress on ambulation.  Currently on 3 L of nasal cannula oxygen Respiratory system: Wheezing heard posteriorly, air entry fair Cardiovascular system: S1 & S2 heard, RRR.    Gastrointestinal system: Abdomen is nondistended, soft and nontender. No organomegaly or masses felt. Normal bowel sounds heard. Central nervous system: Alert and oriented. No focal neurological deficits. Extremities: Symmetric 5 x 5 power. Skin: No rashes, lesions or ulcers Psychiatry:  Mood & affect appropriate.     Data Reviewed: I have personally reviewed following labs and imaging studies  CBC: Recent Labs  Lab 06/10/18 0729 06/11/18 0424 06/12/18 0410 06/13/18 0431 06/14/18 0405  WBC 4.6 4.7 4.3 4.7 5.7  HGB 9.8* 10.5* 10.5* 10.9* 11.5*  HCT 33.2* 35.1* 35.7* 37.0 38.5  MCV 89.5 87.5 87.7 88.9 88.5  PLT 64* 67* 70* 70* 74*   Basic Metabolic Panel: Recent Labs  Lab 06/10/18 2058 06/11/18 0424 06/12/18 0410 06/13/18 0431 06/14/18 0405  NA 135 135 135 134* 134*  K 3.6 3.8 4.1 3.8 4.2  CL 87* 89* 89* 87* 90*  CO2 35* 33* 35* 35* 34*  GLUCOSE 219* 215* 220* 269* 236*  BUN 49* 49* 60* 66* 56*  CREATININE 1.28* 1.20* 1.46* 1.52* 1.28*  CALCIUM 9.2 8.9 8.6* 8.4* 8.6*  MG 2.0  --   --   --   --    GFR: Estimated Creatinine Clearance: 57.4 mL/min (A) (by C-G formula based on SCr of 1.28 mg/dL (H)). Liver Function Tests: No results for input(s): AST, ALT, ALKPHOS, BILITOT, PROT, ALBUMIN in the last 168 hours. No results for input(s): LIPASE, AMYLASE in the last 168 hours. No results for input(s): AMMONIA in the last 168 hours. Coagulation Profile: No results for input(s): INR, PROTIME in the last 168 hours. Cardiac Enzymes: No results for input(s): CKTOTAL, CKMB, CKMBINDEX, TROPONINI in the last 168 hours. BNP (last 3 results) No results for input(s): PROBNP in the last 8760 hours. HbA1C: Recent Labs    06/12/18 0410  HGBA1C 5.6   CBG: Recent Labs  Lab 06/13/18 1210 06/13/18 1700 06/13/18 2111 06/14/18 0756 06/14/18 1201  GLUCAP 250* 214* 249* 223* 273*   Lipid Profile: No results for input(s): CHOL, HDL, LDLCALC, TRIG, CHOLHDL, LDLDIRECT in the  last 72 hours. Thyroid Function Tests: No results for input(s): TSH, T4TOTAL, FREET4, T3FREE, THYROIDAB in the last 72 hours. Anemia Panel: No results for input(s): VITAMINB12, FOLATE, FERRITIN, TIBC, IRON, RETICCTPCT in the last 72 hours. Sepsis Labs: No results for input(s): PROCALCITON, LATICACIDVEN in the last 168 hours.  Recent Results (from the past 240 hour(s))  Culture, blood (routine x 2) Call MD if unable to obtain prior to antibiotics being given     Status: None   Collection Time: 06/09/18  4:38 AM  Result Value Ref Range Status   Specimen Description   Final    BLOOD RIGHT HAND Performed at [facility] [address], [place], [place] [address]    Special Requests   Final    BOTTLES DRAWN AEROBIC AND ANAEROBIC Blood Culture adequate volume Performed at [facility] [address] [place], [place] [address]    Culture   Final    NO GROWTH 5 DAYS Performed at [facility], [place] [address], [place], [place] [address]  Report Status 06/14/2018 FINAL  Final  Culture, blood (routine x 2) Call MD if unable to obtain prior to antibiotics being given     Status: None   Collection Time: 06/09/18  5:03 AM  Result Value Ref Range Status   Specimen Description   Final    BLOOD BLOOD RIGHT FOREARM Performed at [facility] [address] [place], [place] [address]    Special Requests   Final    BOTTLES DRAWN AEROBIC AND ANAEROBIC Blood Culture results may not be optimal due to an excessive volume of blood received in culture bottles Performed at [facility] [address], [place], [place] [address]    Culture   Final    NO GROWTH 5 DAYS Performed at [facility], [place] [address], [place], [place] [address]    Report Status 06/14/2018 FINAL  Final         Radiology Studies: Vas [place] Lower Extremity Venous (dvt)  Result Date: 06/13/2018  Lower Venous Study Indications: Edema.   Limitations: Body habitus and poor ultrasound/tissue interface. Performing Technologist: Oliver Hum RVT  Examination Guidelines: A complete evaluation includes B-mode imaging, spectral Doppler, color Doppler, and power Doppler as needed of all accessible portions of each vessel. Bilateral testing is considered an integral part of a complete examination. Limited examinations for reoccurring indications may be performed as noted.  Right Venous Findings: +---------+---------------+---------+-----------+----------+--------------+          CompressibilityPhasicitySpontaneityPropertiesSummary        +---------+---------------+---------+-----------+----------+--------------+ CFV      Full           Yes      Yes                                 +---------+---------------+---------+-----------+----------+--------------+ SFJ      Full                                                        +---------+---------------+---------+-----------+----------+--------------+ FV Prox  Full                                                        +---------+---------------+---------+-----------+----------+--------------+ FV Mid   Full                                                        +---------+---------------+---------+-----------+----------+--------------+ FV DistalFull                                                        +---------+---------------+---------+-----------+----------+--------------+ PFV      Full           Yes      Yes                                 +---------+---------------+---------+-----------+----------+--------------+  POP      Full                                                        +---------+---------------+---------+-----------+----------+--------------+ PTV      Full                                                        +---------+---------------+---------+-----------+----------+--------------+ PERO                                                   Not visualized +---------+---------------+---------+-----------+----------+--------------+  Left Venous Findings: +---------+---------------+---------+-----------+----------+--------------+          CompressibilityPhasicitySpontaneityPropertiesSummary        +---------+---------------+---------+-----------+----------+--------------+ CFV      Full           Yes      Yes                                 +---------+---------------+---------+-----------+----------+--------------+ SFJ      Full                                                        +---------+---------------+---------+-----------+----------+--------------+ FV Prox  Full                                                        +---------+---------------+---------+-----------+----------+--------------+ FV Mid   Full                                                        +---------+---------------+---------+-----------+----------+--------------+ FV DistalFull                                                        +---------+---------------+---------+-----------+----------+--------------+ PFV      Full                                                        +---------+---------------+---------+-----------+----------+--------------+ POP      Full           Yes      Yes                                 +---------+---------------+---------+-----------+----------+--------------+  PTV      Full                                                        +---------+---------------+---------+-----------+----------+--------------+ PERO                                                  Not visualized +---------+---------------+---------+-----------+----------+--------------+    Summary: Right: There is no evidence of deep vein thrombosis in the lower extremity. However, portions of this examination were limited- see technologist comments above. No cystic structure found in the popliteal fossa.  Left: There is no evidence of deep vein thrombosis in the lower extremity. However, portions of this examination were limited- see technologist comments above. No cystic structure found in the popliteal fossa.  *See table(s) above for measurements and observations. Electronically signed by Deitra Mayo MD on 06/13/2018 at 5:05:33 PM.    Final         Scheduled Meds: . acidophilus  1 capsule Oral Daily  . diltiazem  180 mg Oral Daily  . guaiFENesin  1,200 mg Oral BID  . heparin injection (subcutaneous)  5,000 Units Subcutaneous Q8H  . insulin aspart  0-20 Units Subcutaneous TID WC  . insulin aspart  6 Units Subcutaneous TID WC  . insulin glargine  20 Units Subcutaneous Daily  . ipratropium-albuterol  3 mL Nebulization TID  . levothyroxine  88 mcg Oral QAC breakfast  . methylPREDNISolone (SOLU-MEDROL) injection  60 mg Intravenous Q12H  . metoCLOPramide  10 mg Oral BID  . mometasone-formoterol  2 puff Inhalation BID  . morphine  30 mg Oral Q12H  . multivitamin with minerals  1 tablet Oral Daily  . nortriptyline  25 mg Oral QHS  . pravastatin  20 mg Oral q1800   Continuous Infusions:   LOS: 4 days    Time spent: 36 minutes    Hosie Poisson, MD [facility] Pager [phone] If 7PM-7AM, please contact night-coverage [email or web address] Password [email or web address] 06/14/2018, 2:10 PM

## 2018-06-15 LAB — BASIC METABOLIC PANEL
Anion gap: 10 (ref 5–15)
BUN: 54 mg/dL — ABNORMAL HIGH (ref 8–23)
CO2: 33 mmol/L — AB (ref 22–32)
Calcium: 8.6 mg/dL — ABNORMAL LOW (ref 8.9–10.3)
Chloride: 92 mmol/L — ABNORMAL LOW (ref 98–111)
Creatinine, Ser: 1.18 mg/dL — ABNORMAL HIGH (ref 0.44–1.00)
GFR calc Af Amer: 56 mL/min — ABNORMAL LOW (ref >60)
GFR calc non Af Amer: 49 mL/min — ABNORMAL LOW (ref >60)
Glucose, Bld: 220 mg/dL — ABNORMAL HIGH (ref 70–99)
Potassium: 5 mmol/L (ref 3.5–5.1)
Sodium: 135 mmol/L (ref 135–145)

## 2018-06-15 LAB — GLUCOSE, CAPILLARY
Glucose-Capillary: 178 mg/dL — ABNORMAL HIGH (ref 70–99)
Glucose-Capillary: 214 mg/dL — ABNORMAL HIGH (ref 70–99)

## 2018-06-15 LAB — CBC
HEMATOCRIT: 39.1 % (ref 36.0–46.0)
HEMOGLOBIN: 11.4 g/dL — AB (ref 12.0–15.0)
MCH: 25.4 pg — ABNORMAL LOW (ref 26.0–34.0)
MCHC: 29.2 g/dL — ABNORMAL LOW (ref 30.0–36.0)
MCV: 87.3 fL (ref 80.0–100.0)
Platelets: 76 10*3/uL — ABNORMAL LOW (ref 150–400)
RBC: 4.48 MIL/uL (ref 3.87–5.11)
RDW: 16.5 % — ABNORMAL HIGH (ref 11.5–15.5)
WBC: 5.5 10*3/uL (ref 4.0–10.5)
nRBC: 0 % (ref 0.0–0.2)

## 2018-06-15 MED ORDER — BENZONATATE 200 MG PO CAPS: 200.0000 mg | ORAL_CAPSULE | Freq: Three times a day (TID) | ORAL | 0 refills | Status: DC | PRN

## 2018-06-15 MED ORDER — PREDNISONE 10 MG (21) PO TBPK: ORAL_TABLET | ORAL | 0 refills | Status: DC

## 2018-06-15 MED ORDER — DM-GUAIFENESIN ER 30-600 MG PO TB12: 1.0000 | ORAL_TABLET | Freq: Two times a day (BID) | ORAL | 0 refills | Status: AC | PRN

## 2018-06-15 NOTE — Progress Notes (Signed)
Patient requested resources for financial assistance with her current hospital bill and utility bill. Patient reports she currently has power and water at her home.  CSW provided the patient with the contact information for [facility] Emergency assistance (reference) and [employer] financial support.   Patient appreciative of CSW.  No other needs identified at this time, CSW signing off.   Kathrin Greathouse, Marlinda Mike, MSW Clinical Social Worker  [phone] 06/15/2018  1:38 PM

## 2018-06-15 NOTE — Progress Notes (Signed)
Physical Therapy Treatment Patient Details Name: Renee Noble MRN: [id number] DOB: 06/06/1955 Today's Date: 06/15/2018    History of Present Illness 63 y.o. female admitted on 06/10/18 for increased DOE, decreased O2 sats.  Pt dx with acute on chronic repiratory failure with hypoxia/COPD exacerbation, AKI on CKD (in the setting of solitary kidney), asymptomatic V-tach, and acute on chronic diastolic CHF.  Pt with significant PMH of RLS, neuropathy, morbid obesity, fibromyalgia, DM, cervical compression fx, and HTN.    PT Comments    Pt seated EOB on arrival on her I pad.  Assisted with amb in hallway using her new 4WW on 4 lts for activity to achieve therapeutic sats.  Pt knowledgeable about her physical abilities and is aware when she needs to stop and break.and perform purse lip breathing.  Returned to room and assisted to bathroom at Supervision level.  Pt present with good safety cognition and self ability to perform self care.  Pt appreciates the care she was given here and loves her new walker.  Pt hopes to D/C to home today.   Follow Up Recommendations  Home health PT     Equipment Recommendations  (4 ww delivered and in room)    Recommendations for Other Services       Precautions / Restrictions Precautions Precautions: Fall Precaution Comments: home oxygen 3  lts at rest 4 lts with activity Restrictions Weight Bearing Restrictions: No    Mobility  Bed Mobility Overal bed mobility: Modified Independent             General bed mobility comments: sitting EOB on arrival  Transfers Overall transfer level: Needs assistance Equipment used: None Transfers: Sit to/from [facility] Sit to Stand: Supervision Stand pivot transfers: Supervision       General transfer comment: good safety cognition and use of hands to steady self.  Also performed toilet transfer   Ambulation/Gait Ambulation/Gait assistance: Supervision Gait Distance (Feet): 225 Feet(2  seated rest breaks ) Assistive device: 4-wheeled walker Gait Pattern/deviations: Step-through pattern Gait velocity: decreased    General Gait Details: pt tolerated an increased distance but required 4 lts to achieve sats > 89%.  Required an extnded seated rest break with 1/4 dyspnea.  Pt knowledgable about purse lip breathing.     Stairs             Wheelchair Mobility    Modified Rankin (Stroke Patients Only)       Balance                                            Cognition                                              Exercises      General Comments        Pertinent Vitals/Pain      Home Living                      Prior Function            PT Goals (current goals can now be found in the care plan section) Progress towards PT goals: Progressing toward goals    Frequency    Min 3X/week  PT Plan Current plan remains appropriate    Co-evaluation              AM-PAC PT "6 Clicks" Mobility   Outcome Measure  Help needed turning from your back to your side while in a flat bed without using bedrails?: None Help needed moving from lying on your back to sitting on the side of a flat bed without using bedrails?: None Help needed moving to and from a bed to a chair (including a wheelchair)?: None Help needed standing up from a chair using your arms (e.g., wheelchair or bedside chair)?: A Little Help needed to walk in hospital room?: A Little Help needed climbing 3-5 steps with a railing? : A Little 6 Click Score: 21    End of Session Equipment Utilized During Treatment: Oxygen Activity Tolerance: Patient limited by fatigue Patient left: in bed;with call bell/phone within reach(EOB)   PT Visit Diagnosis: Difficulty in walking, not elsewhere classified (R26.2);Muscle weakness (generalized) (M62.81);Repeated falls (R29.6)     Time: 9532-0233 PT Time Calculation (min) (ACUTE ONLY): 18  min  Charges:  $Gait Training: 8-22 mins                     Rica Koyanagi  PTA Acute  Rehabilitation Services Pager      [phone] Office      [phone]

## 2018-06-15 NOTE — Discharge Summary (Signed)
Physician Discharge Summary  MARYSE BRIERLEY AST:[id number] DOB: 1956/01/22 DOA: 06/08/2018  PCP: [name], PA-C  Admit date: 06/08/2018 Discharge date: 06/15/2018  Admitted From: Home.  Disposition:  Home.   Recommendations for Outpatient Follow-up:  1. Follow up with PCP in 1-2 weeks 2. Please obtain BMP/CBC in one week Please follow up with pulmonology in 1 to 2 weeks.   Discharge Condition:stable.  CODE STATUS: full code.  Diet recommendation: Heart Healthy Brief/Interim Summary:   Monifah Freehling Emigis a 63 y.o.femalewith medical history significant ofmorbid obesity,solitary kidney, CKD stage III, hypertension, hyperlipidemia, diabetes mellitus, COPD on 3 L oxygen at home,chronic diastolicCHF, HCV, OSA on CPAP, fibromyalgia, chronic back pain and neck pain, depression, hypothyroidism, who presents with shortness of breath. Admitted for acute on chronic hypoxic respiratory failure secondary toacute COPD exacerbation.  Hospital course complicated by persistent hypoxia requiring higher level of oxygen above her baseline of 3 L continuously despite aggressive treatment.Patient follows with Dr. Keene Breath pulmonologist.   Discharge Diagnoses:  Principal Problem:   Acute on chronic respiratory failure with hypoxia ([place]) Active Problems:   Type II diabetes mellitus with renal manifestations ([facility])   Essential hypertension   CKD (chronic kidney disease), stage III (HCC)   OSA (obstructive sleep apnea)   Acute on chronic diastolic CHF (congestive heart failure) (HCC)   HLD (hyperlipidemia)   Hypothyroidism   Depression   Fibromyalgia   COPD exacerbation (HCC)  Acute on chronic respiratory failure with hypoxia probably secondary to acute COPD exacerbation She is back to 3 L of nasal cannula oxygen but on ambulation she is requiring up to 4 L of nasal cannula oxygen. Keep sats greater than 90%.  Evaluate home oxygen requirement prior to discharge.  As her wheezing has  improved we will transition to prednisone taper on discharge. Continue with duo nebs and flutter valve. Blood cultures, sputum cultures and flu PCR is negative. Continue with incentive spirometry and Mucinex.  Patient appears to have completed Z-Pak.  Asymptomatic nonsustained V. tach Resolved.   Acute kidney injury on stage III CKD in the setting of solitary kidney Diuresed about liters since admission. Creatinine stable around 1.2 which appears to be her baseline   Hypertension Well-controlled continue with Cardizem and Cozaar   Type 2 diabetes mellitus with hyperglycemia Increased lantus on discharge to her home dose.     Obstructive sleep apnea Patient has not been able to tolerate CPAP in the hospital.   History of chronic thrombocytopenia Platelets stable and no signs of bleeding   Hypothyroidism Continue with Synthroid   Hyperlipidemia Continue with statin  History of solitary kidney  No new complaints.    History of fibromyalgia with chronic neck pain and back pain Continue with home meds    Depression  continue with nortriptyline     Discharge Instructions  Discharge Instructions    Diet - low sodium heart healthy   Complete by:  As directed    Increase activity slowly   Complete by:  As directed      Allergies as of 06/15/2018      Reactions   Gabapentin Anaphylaxis   Lyrica [pregabalin] Shortness Of Breath   Trouble breathing   Ketorolac Tromethamine Hives   Lisinopril Cough      Medication List    STOP taking these medications   doxycycline 100 MG capsule Commonly known as:  VIBRAMYCIN   glucose blood test strip Commonly known as:  GLUCOSE METER TEST   guaiFENesin 600 MG 12 hr  tablet Commonly known as:  MUCINEX   losartan 25 MG tablet Commonly known as:  COZAAR   predniSONE 20 MG tablet Commonly known as:  DELTASONE Replaced by:  predniSONE 10 MG (21) Tbpk tablet   varenicline 0.5 MG X 11 & 1 MG  X 42 tablet Commonly known as:  CHANTIX PAK     TAKE these medications   albuterol 108 (90 Base) MCG/ACT inhaler Commonly known as:  PROAIR HFA Inhale 1-2 puffs into the lungs every 6 (six) hours as needed for wheezing or shortness of breath.   benzonatate 200 MG capsule Commonly known as:  TESSALON Take 1 capsule (200 mg total) by mouth 3 (three) times daily as needed for cough.   budesonide-formoterol 160-4.5 MCG/ACT inhaler Commonly known as:  SYMBICORT Inhale 2 puffs into the lungs 2 (two) times daily.   CENTRUM SILVER PO Take by mouth.   clotrimazole-betamethasone cream Commonly known as:  LOTRISONE Apply 1 application topically 2 (two) times daily as needed. What changed:  reasons to take this   dextromethorphan-guaiFENesin 30-600 MG 12hr tablet Commonly known as:  MUCINEX DM Take 1 tablet by mouth 2 (two) times daily as needed for cough.   diltiazem 180 MG 24 hr capsule Commonly known as:  CARDIZEM CD Take 1 capsule (180 mg total) by mouth daily.   HUMALOG 100 UNIT/ML injection Generic drug:  insulin lispro INJECT 0.15 ML (15 UNITS) INTO THE SKIN 3 TIMES DAILY AS NEEDED FOR HIGH BLOOD SUGAR What changed:  See the new instructions.   insulin glargine 100 UNIT/ML injection Commonly known as:  LANTUS INJECT 0.3MLS (30 UNITS TOTAL) INTO THE SKIN EVERY DAY What changed:    how much to take  how to take this  when to take this   ipratropium-albuterol 0.5-2.5 (3) MG/3ML Soln Commonly known as:  DUONEB Take 3 mLs by nebulization 4 (four) times daily. Dx: J44.9   levothyroxine 88 MCG tablet Commonly known as:  SYNTHROID, LEVOTHROID Take 1 tablet (88 mcg total) by mouth daily before breakfast. What changed:  when to take this   metoCLOPramide 10 MG tablet Commonly known as:  REGLAN Take 1 tablet (10 mg total) by mouth every 6 (six) hours as needed for nausea. What changed:  when to take this   morphine 30 MG 12 hr tablet Commonly known as:  MS CONTIN Take  30 mg by mouth every 12 (twelve) hours.   nortriptyline 25 MG capsule Commonly known as:  PAMELOR Take 1 capsule (25 mg total) by mouth at bedtime.   nystatin powder Commonly known as:  MYCOSTATIN/NYSTOP APPLY TOPICALLY TWICE DAILY AS NEEDED FOR YEAST INFECTION What changed:  See the new instructions.   OXYGEN O2 3L at home.   pravastatin 20 MG tablet Commonly known as:  PRAVACHOL TAKE ONE TABLET BY MOUTH EVERY DAY   predniSONE 10 MG (21) Tbpk tablet Commonly known as:  STERAPRED UNI-PAK 21 TAB Prednisone 60 mg daily for 3 days followed by  Prednisone 40 mg daily for 3 days followed by  Prednisone 20 mg daily for 3 days followed by  Prednisone 10 mg daily for 3 days. Replaces:  predniSONE 20 MG tablet   PROBIOTIC PO Take by mouth.   SURE COMFORT INSULIN SYRINGE 30G X 1/2" 1 ML Misc Generic drug:  Insulin Syringe-Needle U-100 USE FOUR (4) TIMES DAILY AS DIRECTED What changed:  See the new instructions.   tiZANidine 2 MG tablet Commonly known as:  ZANAFLEX Take 2-4 mg by mouth every  12 (twelve) hours as needed for muscle spasms.   torsemide 20 MG tablet Commonly known as:  DEMADEX Take 1 tablet (20 mg total) by mouth daily.      Follow-up Information    [name], PA-C. Schedule an appointment as soon as possible for a visit in 1 week(s).   Specialty:  Family Medicine Contact information: [address] [place] [address] [phone]        Tanda Rockers, MD. Schedule an appointment as soon as possible for a visit in 1 week(s).   Specialty:  Pulmonary Disease Contact information: [place] [address] [phone]          Allergies  Allergen Reactions  . Gabapentin Anaphylaxis  . Lyrica [Pregabalin] Shortness Of Breath    Trouble breathing  . Ketorolac Tromethamine Hives  . Lisinopril Cough    Consultations: None.   Procedures/Studies: Dg Chest 2 View  Result Date: 06/08/2018 CLINICAL  DATA:  63 y/o F; history of COPD and CHF. Shortness of breath. EXAM: CHEST - 2 VIEW COMPARISON:  04/07/2018 chest radiograph. 05/03/2018 CT chest. FINDINGS: Enlarged cardiac silhouette. Aortic atherosclerosis with calcification. Pulmonary vascular congestion. No focal consolidation. No pleural effusion or pneumothorax. No acute osseous abnormality is evident. IMPRESSION: Enlarged cardiac silhouette. Pulmonary vascular congestion. No focal consolidation. Electronically Signed   By: Kristine Garbe M.D.   On: 06/08/2018 20:12   Vas [place] Lower Extremity Venous (dvt)  Result Date: 06/13/2018  Lower Venous Study Indications: Edema.  Limitations: Body habitus and poor ultrasound/tissue interface. Performing Technologist: Oliver Hum RVT  Examination Guidelines: A complete evaluation includes B-mode imaging, spectral Doppler, color Doppler, and power Doppler as needed of all accessible portions of each vessel. Bilateral testing is considered an integral part of a complete examination. Limited examinations for reoccurring indications may be performed as noted.  Right Venous Findings: +---------+---------------+---------+-----------+----------+--------------+          CompressibilityPhasicitySpontaneityPropertiesSummary        +---------+---------------+---------+-----------+----------+--------------+ CFV      Full           Yes      Yes                                 +---------+---------------+---------+-----------+----------+--------------+ SFJ      Full                                                        +---------+---------------+---------+-----------+----------+--------------+ FV Prox  Full                                                        +---------+---------------+---------+-----------+----------+--------------+ FV Mid   Full                                                        +---------+---------------+---------+-----------+----------+--------------+ FV  DistalFull                                                        +---------+---------------+---------+-----------+----------+--------------+  PFV      Full           Yes      Yes                                 +---------+---------------+---------+-----------+----------+--------------+ POP      Full                                                        +---------+---------------+---------+-----------+----------+--------------+ PTV      Full                                                        +---------+---------------+---------+-----------+----------+--------------+ PERO                                                  Not visualized +---------+---------------+---------+-----------+----------+--------------+  Left Venous Findings: +---------+---------------+---------+-----------+----------+--------------+          CompressibilityPhasicitySpontaneityPropertiesSummary        +---------+---------------+---------+-----------+----------+--------------+ CFV      Full           Yes      Yes                                 +---------+---------------+---------+-----------+----------+--------------+ SFJ      Full                                                        +---------+---------------+---------+-----------+----------+--------------+ FV Prox  Full                                                        +---------+---------------+---------+-----------+----------+--------------+ FV Mid   Full                                                        +---------+---------------+---------+-----------+----------+--------------+ FV DistalFull                                                        +---------+---------------+---------+-----------+----------+--------------+ PFV      Full                                                        +---------+---------------+---------+-----------+----------+--------------+  POP      Full           Yes       Yes                                 +---------+---------------+---------+-----------+----------+--------------+ PTV      Full                                                        +---------+---------------+---------+-----------+----------+--------------+ PERO                                                  Not visualized +---------+---------------+---------+-----------+----------+--------------+    Summary: Right: There is no evidence of deep vein thrombosis in the lower extremity. However, portions of this examination were limited- see technologist comments above. No cystic structure found in the popliteal fossa. Left: There is no evidence of deep vein thrombosis in the lower extremity. However, portions of this examination were limited- see technologist comments above. No cystic structure found in the popliteal fossa.  *See table(s) above for measurements and observations. Electronically signed by Deitra Mayo MD on 06/13/2018 at 5:05:33 PM.    Final        Subjective: Pt reports feeling wonderful today. No chest pain or sob, headache or dizziness.   Discharge Exam: Vitals:   06/15/18 0812 06/15/18 1332  BP: 137/73 (!) 155/80  Pulse: 83 97  Resp: 17 17  Temp: 98 F (36.7 C) 97.9 F (36.6 C)  SpO2: 99% 95%   Vitals:   06/15/18 0555 06/15/18 0747 06/15/18 0812 06/15/18 1332  BP: (!) 145/91  137/73 (!) 155/80  Pulse: 92  83 97  Resp: 18  17 17   Temp: 97.9 F (36.6 C)  98 F (36.7 C) 97.9 F (36.6 C)  TempSrc: Oral  Oral Oral  SpO2: 99% 96% 99% 95%  Weight:      Height:        General: Pt is alert, awake, not in acute distress Cardiovascular: RRR, S1/S2 +, no rubs, no gallops Respiratory: CTA bilaterally, no wheezing, no rhonchi Abdominal: Soft, NT, ND, bowel sounds + Extremities: no edema, no cyanosis    The results of significant diagnostics from this hospitalization (including imaging, microbiology, ancillary and laboratory) are listed below  for reference.     Microbiology: Recent Results (from the past 240 hour(s))  Culture, blood (routine x 2) Call MD if unable to obtain prior to antibiotics being given     Status: None   Collection Time: 06/09/18  4:38 AM  Result Value Ref Range Status   Specimen Description   Final    BLOOD RIGHT HAND Performed at [facility] [address], [place], [place] [address]    Special Requests   Final    BOTTLES DRAWN AEROBIC AND ANAEROBIC Blood Culture adequate volume Performed at [facility] [address] [place], [place] [address]    Culture   Final    NO GROWTH 5 DAYS Performed at [facility], [place] [address] [place],  [address]    Report Status 06/14/2018 FINAL  Final  Culture, blood (routine x 2) Call MD if unable to obtain prior to antibiotics being given     Status: None   Collection Time: 06/09/18  5:03 AM  Result Value Ref Range Status   Specimen Description   Final    BLOOD BLOOD RIGHT FOREARM Performed at [facility] [address], [place], [place] [address]    Special Requests   Final    BOTTLES DRAWN AEROBIC AND ANAEROBIC Blood Culture results may not be optimal due to an excessive volume of blood received in culture bottles Performed at [facility] [address], [place], [place] [address]    Culture   Final    NO GROWTH 5 DAYS Performed at [facility], [place] [address], [place], [place] [address]    Report Status 06/14/2018 FINAL  Final     Labs: BNP (last 3 results) Recent Labs    04/07/18 1642 06/08/18 2115  BNP 597.0* 175.1*   Basic Metabolic Panel: Recent Labs  Lab 06/10/18 2058 06/11/18 0424 06/12/18 0410 06/13/18 0431 06/14/18 0405 06/15/18 0411  NA 135 135 135 134* 134* 135  K 3.6 3.8 4.1 3.8 4.2 5.0  CL 87* 89* 89* 87* 90* 92*  CO2 35* 33* 35* 35* 34* 33*  GLUCOSE 219* 215* 220* 269* 236* 220*  BUN 49* 49* 60* 66* 56* 54*   CREATININE 1.28* 1.20* 1.46* 1.52* 1.28* 1.18*  CALCIUM 9.2 8.9 8.6* 8.4* 8.6* 8.6*  MG 2.0  --   --   --   --   --    Liver Function Tests: No results for input(s): AST, ALT, ALKPHOS, BILITOT, PROT, ALBUMIN in the last 168 hours. No results for input(s): LIPASE, AMYLASE in the last 168 hours. No results for input(s): AMMONIA in the last 168 hours. CBC: Recent Labs  Lab 06/11/18 0424 06/12/18 0410 06/13/18 0431 06/14/18 0405 06/15/18 0411  WBC 4.7 4.3 4.7 5.7 5.5  HGB 10.5* 10.5* 10.9* 11.5* 11.4*  HCT 35.1* 35.7* 37.0 38.5 39.1  MCV 87.5 87.7 88.9 88.5 87.3  PLT 67* 70* 70* 74* 76*   Cardiac Enzymes: No results for input(s): CKTOTAL, CKMB, CKMBINDEX, TROPONINI in the last 168 hours. BNP: Invalid input(s): POCBNP CBG: Recent Labs  Lab 06/14/18 1201 06/14/18 1710 06/14/18 2225 06/15/18 0750 06/15/18 1138  GLUCAP 273* 214* 150* 178* 214*   D-Dimer Recent Labs    06/13/18 0718  DDIMER 2.04*   Hgb A1c No results for input(s): HGBA1C in the last 72 hours. Lipid Profile No results for input(s): CHOL, HDL, LDLCALC, TRIG, CHOLHDL, LDLDIRECT in the last 72 hours. Thyroid function studies No results for input(s): TSH, T4TOTAL, T3FREE, THYROIDAB in the last 72 hours.  Invalid input(s): FREET3 Anemia work up No results for input(s): VITAMINB12, FOLATE, FERRITIN, TIBC, IRON, RETICCTPCT in the last 72 hours. Urinalysis    Component Value Date/Time   COLORURINE YELLOW 04/07/2018 1820   APPEARANCEUR HAZY (A) 04/07/2018 1820   LABSPEC 1.015 04/07/2018 1820   PHURINE 6.0 04/07/2018 1820   GLUCOSEU NEGATIVE 04/07/2018 1820   GLUCOSEU NEGATIVE 08/17/2016 1340   HGBUR SMALL (A) 04/07/2018 1820   BILIRUBINUR NEGATIVE 04/07/2018 1820   BILIRUBINUR neg 01/15/2015 [place] 04/07/2018 1820   PROTEINUR 30 (A) 04/07/2018 1820   UROBILINOGEN 1.0 08/17/2016 1340   NITRITE NEGATIVE 04/07/2018 1820   LEUKOCYTESUR NEGATIVE 04/07/2018 1820   Sepsis Labs Invalid  input(s): PROCALCITONIN,  WBC,  LACTICIDVEN Microbiology Recent Results (from  the past 240 hour(s))  Culture, blood (routine x 2) Call MD if unable to obtain prior to antibiotics being given     Status: None   Collection Time: 06/09/18  4:38 AM  Result Value Ref Range Status   Specimen Description   Final    BLOOD RIGHT HAND Performed at [facility] [address], [place], [place] [address]    Special Requests   Final    BOTTLES DRAWN AEROBIC AND ANAEROBIC Blood Culture adequate volume Performed at [facility] [address] [place], [place] [address]    Culture   Final    NO GROWTH 5 DAYS Performed at [facility], [place] [address], [place], [place] [address]    Report Status 06/14/2018 FINAL  Final  Culture, blood (routine x 2) Call MD if unable to obtain prior to antibiotics being given     Status: None   Collection Time: 06/09/18  5:03 AM  Result Value Ref Range Status   Specimen Description   Final    BLOOD BLOOD RIGHT FOREARM Performed at [facility] [address] [place], [place] [address]    Special Requests   Final    BOTTLES DRAWN AEROBIC AND ANAEROBIC Blood Culture results may not be optimal due to an excessive volume of blood received in culture bottles Performed at [facility] [address] [place], [place] [address]    Culture   Final    NO GROWTH 5 DAYS Performed at [facility], [place] [address], [place], [place] [address]    Report Status 06/14/2018 FINAL  Final     Time coordinating discharge: 34 minutes  SIGNED:   Hosie Poisson, MD  [facility] 06/15/2018, 10:52 PM Pager   If 7PM-7AM, please contact night-coverage [email or web address] Password [email or web address]

## 2018-06-16 ENCOUNTER — Telehealth: Payer: Self-pay

## 2018-06-16 NOTE — Telephone Encounter (Signed)
Transition Care Management Follow-up Telephone Call  Admit date: 06/08/2018 Discharge date: 06/15/2018 Principal Problem:  Acute on chronic respiratory failure with hypoxia    How have you been since you were released from the hospital? "I'm tired" . Oxygen 3L at rest, 4L while up/ambulating.    Do you understand why you were in the hospital? yes, COPD   Do you understand the discharge instructions? yes   Where were you discharged to? Home. Lives with 2 adults.    Items Reviewed:  Medications reviewed: no, pt states Losartan was discontinued. Started on Prednisone. Pt to bring meds to appt.   Allergies reviewed: yes  Dietary changes reviewed: yes  Referrals reviewed: yes, pt to call for pulm appt.    Functional Questionnaire:   Activities of Daily Living (ADLs):   She states they are independent in the following: Pt states she needs help with ADLs States they require assistance with the following: [facility] RN to assist with ADLs   Any transportation issues/concerns?: no   Any patient concerns? no   Confirmed importance and date/time of follow-up visits scheduled yes  Provider Appointment booked with PCP Wed, 06/21/2018.   Confirmed with patient if condition begins to worsen call PCP or go to the ER.  Patient was given the office number and encouraged to call back with question or concerns.  : yes

## 2018-06-17 DIAGNOSIS — E039 Hypothyroidism, unspecified: Secondary | ICD-10-CM | POA: Diagnosis not present

## 2018-06-17 DIAGNOSIS — M1991 Primary osteoarthritis, unspecified site: Secondary | ICD-10-CM | POA: Diagnosis not present

## 2018-06-17 DIAGNOSIS — E1151 Type 2 diabetes mellitus with diabetic peripheral angiopathy without gangrene: Secondary | ICD-10-CM | POA: Diagnosis not present

## 2018-06-17 DIAGNOSIS — I872 Venous insufficiency (chronic) (peripheral): Secondary | ICD-10-CM | POA: Diagnosis not present

## 2018-06-17 DIAGNOSIS — I2781 Cor pulmonale (chronic): Secondary | ICD-10-CM | POA: Diagnosis not present

## 2018-06-17 DIAGNOSIS — I471 Supraventricular tachycardia: Secondary | ICD-10-CM | POA: Diagnosis not present

## 2018-06-17 DIAGNOSIS — J9622 Acute and chronic respiratory failure with hypercapnia: Secondary | ICD-10-CM | POA: Diagnosis not present

## 2018-06-17 DIAGNOSIS — N183 Chronic kidney disease, stage 3 (moderate): Secondary | ICD-10-CM | POA: Diagnosis not present

## 2018-06-17 DIAGNOSIS — G894 Chronic pain syndrome: Secondary | ICD-10-CM | POA: Diagnosis not present

## 2018-06-17 DIAGNOSIS — E1143 Type 2 diabetes mellitus with diabetic autonomic (poly)neuropathy: Secondary | ICD-10-CM | POA: Diagnosis not present

## 2018-06-17 DIAGNOSIS — M797 Fibromyalgia: Secondary | ICD-10-CM | POA: Diagnosis not present

## 2018-06-17 DIAGNOSIS — K3184 Gastroparesis: Secondary | ICD-10-CM | POA: Diagnosis not present

## 2018-06-17 DIAGNOSIS — G2581 Restless legs syndrome: Secondary | ICD-10-CM | POA: Diagnosis not present

## 2018-06-17 DIAGNOSIS — E1140 Type 2 diabetes mellitus with diabetic neuropathy, unspecified: Secondary | ICD-10-CM | POA: Diagnosis not present

## 2018-06-17 DIAGNOSIS — J9621 Acute and chronic respiratory failure with hypoxia: Secondary | ICD-10-CM | POA: Diagnosis not present

## 2018-06-17 DIAGNOSIS — J439 Emphysema, unspecified: Secondary | ICD-10-CM | POA: Diagnosis not present

## 2018-06-17 DIAGNOSIS — Z9981 Dependence on supplemental oxygen: Secondary | ICD-10-CM | POA: Diagnosis not present

## 2018-06-17 DIAGNOSIS — I5033 Acute on chronic diastolic (congestive) heart failure: Secondary | ICD-10-CM | POA: Diagnosis not present

## 2018-06-17 DIAGNOSIS — I130 Hypertensive heart and chronic kidney disease with heart failure and stage 1 through stage 4 chronic kidney disease, or unspecified chronic kidney disease: Secondary | ICD-10-CM | POA: Diagnosis not present

## 2018-06-17 DIAGNOSIS — E1122 Type 2 diabetes mellitus with diabetic chronic kidney disease: Secondary | ICD-10-CM | POA: Diagnosis not present

## 2018-06-17 DIAGNOSIS — G4733 Obstructive sleep apnea (adult) (pediatric): Secondary | ICD-10-CM | POA: Diagnosis not present

## 2018-06-19 ENCOUNTER — Telehealth: Payer: Self-pay | Admitting: Physician Assistant

## 2018-06-19 NOTE — Telephone Encounter (Signed)
Allisonia. Gave verbal orders for PT and OT. Advised patient has pending appointment with PCP on 06/21/18

## 2018-06-19 NOTE — Telephone Encounter (Signed)
Please advise Patient has pending appointment on 06/21/18

## 2018-06-19 NOTE — Telephone Encounter (Signed)
Copied from [place] [id number]. Topic: Quick Communication - Home Health Verbal Orders >> Jun 19, 2018 10:01 AM Percell Belt A wrote: Caller/Agency:  Pam with - [facility]  Callback Number: [phone]- Lenna Sciara or Stanton Kidney  Requesting OT/PT/Skilled Nursing/Social Work:  she had an exacerbation with  her copd.  Just fyi and they will be calling with PT and OT orders

## 2018-06-19 NOTE — Telephone Encounter (Signed)
Ok to give verbal when they call. She has follow-up scheduled with me.

## 2018-06-21 ENCOUNTER — Encounter: Payer: Self-pay | Admitting: Physician Assistant

## 2018-06-21 ENCOUNTER — Other Ambulatory Visit: Payer: Self-pay | Admitting: Physician Assistant

## 2018-06-21 ENCOUNTER — Ambulatory Visit (INDEPENDENT_AMBULATORY_CARE_PROVIDER_SITE_OTHER): Payer: Medicare Other | Admitting: Physician Assistant

## 2018-06-21 ENCOUNTER — Telehealth: Payer: Self-pay | Admitting: Physician Assistant

## 2018-06-21 ENCOUNTER — Other Ambulatory Visit: Payer: Self-pay

## 2018-06-21 VITALS — BP 130/62 | HR 95 | Temp 98.9°F | Resp 18 | Ht 64.0 in | Wt 267.0 lb

## 2018-06-21 DIAGNOSIS — J449 Chronic obstructive pulmonary disease, unspecified: Secondary | ICD-10-CM | POA: Diagnosis not present

## 2018-06-21 DIAGNOSIS — J9612 Chronic respiratory failure with hypercapnia: Secondary | ICD-10-CM

## 2018-06-21 DIAGNOSIS — N183 Chronic kidney disease, stage 3 unspecified: Secondary | ICD-10-CM

## 2018-06-21 DIAGNOSIS — J9611 Chronic respiratory failure with hypoxia: Secondary | ICD-10-CM | POA: Diagnosis not present

## 2018-06-21 DIAGNOSIS — G8929 Other chronic pain: Secondary | ICD-10-CM

## 2018-06-21 NOTE — Patient Instructions (Signed)
Please keep hydrated. Keep O2 at 4 L at rest for now.  Finish entire course of prednisone as directed.  Follow-up with Dr. Melvyn Novas tomorrow as scheduled for hospital follow-up.   ER for any worsening symptoms or if your O2 levels drop below 90 at rest or low-mid 80s with exertion per Pulmonology.

## 2018-06-21 NOTE — Progress Notes (Signed)
Patient presents to clinic today for TCM follow-up. Patient with history of CHF, COPD, HTN, OSA, CKD presented to ER on 06/08/2018 via car with c/o dyspnea. ER workup included EKG and troponin (unremarkable), AKI with Cr at 1.57, BNP at 326, CXR with pulmonary vascular congestion. Patient was given duonebs and prednisone without improvement in symptoms and was still hypoxic with O2 on ambulation. Patient subsequently admitted to hospital for further assessment and management.   During admission, blood cultures, sputum cultures and flu PCR obtained and negative. Started on Azitrhomycin, continued steroids, O2 and breathing treatments. Breathing improved slowly but surely with this regimen. Patient had 1 episode of nonsustained v-tch (asymptomatic) without any recurrence. Creatinine returned to baseline with holding losartan. Patient discharged on 06/15/2018 to finish steroid taper and to follow-up with Pulmonology ASAP and PCP in 1-2 weeks.  Since discharge patient endorses completing course of her antibiotic. Is taking steroid as directed and has a couple of days left. Notes feeling better overall but still feeling fatigued. Denies fever, chills or productive cough. Notes her O2 has been dropping into the low 80s when she stands. Is keeping on 4L O2 most days. Has not scheduled follow-up with Pulmonology. Has scheduled follow-up with Pain management and her Nephrologist.   Past Medical History:  Diagnosis Date  . Abdominal mass of other site   . Cervical compression fracture ([place])   . CHF (congestive heart failure) ([place])   . Chronic kidney disease, stage 3 (HCC)    Borderline Stage 2-3  . Chronic lower limb pain   . COPD (chronic obstructive pulmonary disease) ([place])   . CTS (carpal tunnel syndrome)   . Depression   . Diabetes ([place])    Type II  . Fibromyalgia   . Hepatitis C    cured last year (2018)  . Hypercholesteremia   . Hypertension   . Hypothyroidism   . Morbid obesity ([place])   .  Neuropathy    Diabetes  . OSA (obstructive sleep apnea)   . Osteoarthritis   . Renal cancer ([place])    Left Kidney Removed  . RLS (restless legs syndrome)   . Syncope   . Venous stasis     Current Outpatient Medications on File Prior to Visit  Medication Sig Dispense Refill  . albuterol (PROAIR HFA) 108 (90 Base) MCG/ACT inhaler Inhale 1-2 puffs into the lungs every 6 (six) hours as needed for wheezing or shortness of breath. 1 Inhaler 5  . benzonatate (TESSALON) 200 MG capsule Take 1 capsule (200 mg total) by mouth 3 (three) times daily as needed for cough. 10 capsule 0  . budesonide-formoterol (SYMBICORT) 160-4.5 MCG/ACT inhaler Inhale 2 puffs into the lungs 2 (two) times daily. 1 Inhaler 0  . clotrimazole-betamethasone (LOTRISONE) cream Apply 1 application topically 2 (two) times daily as needed. (Patient taking differently: Apply 1 application topically 2 (two) times daily as needed (yeast). ) 45 g 0  . dextromethorphan-guaiFENesin (MUCINEX DM) 30-600 MG 12hr tablet Take 1 tablet by mouth 2 (two) times daily as needed for cough. 20 tablet 0  . diltiazem (CARDIZEM CD) 180 MG 24 hr capsule Take 1 capsule (180 mg total) by mouth daily. 90 capsule 1  . HUMALOG 100 UNIT/ML injection INJECT 0.15 ML (15 UNITS) INTO THE SKIN 3 TIMES DAILY AS NEEDED FOR HIGH BLOOD SUGAR (Patient taking differently: Inject 15 Units into the skin 3 (three) times daily as needed for high blood sugar. Uses sliding scale. Sugar over 200 will give insulin.) 10  mL 0  . insulin glargine (LANTUS) 100 UNIT/ML injection INJECT 0.3MLS (30 UNITS TOTAL) INTO THE SKIN EVERY DAY (Patient taking differently: Inject 30 Units into the skin every morning. INJECT 0.3MLS (30 UNITS TOTAL) INTO THE SKIN EVERY DAY) 30 mL 3  . ipratropium-albuterol (DUONEB) 0.5-2.5 (3) MG/3ML SOLN Take 3 mLs by nebulization 4 (four) times daily. Dx: J44.9 360 mL 5  . levothyroxine (SYNTHROID, LEVOTHROID) 88 MCG tablet Take 1 tablet (88 mcg total) by mouth  daily before breakfast. (Patient taking differently: Take 88 mcg by mouth at bedtime. ) 90 tablet 1  . metoCLOPramide (REGLAN) 10 MG tablet Take 1 tablet (10 mg total) by mouth every 6 (six) hours as needed for nausea. (Patient taking differently: Take 10 mg by mouth 2 (two) times daily. ) 120 tablet 1  . morphine (MS CONTIN) 30 MG 12 hr tablet Take 30 mg by mouth every 12 (twelve) hours.  0  . Multiple Vitamins-Minerals (CENTRUM SILVER PO) Take by mouth.    . nystatin (MYCOSTATIN/NYSTOP) powder APPLY TOPICALLY TWICE DAILY AS NEEDED FOR YEAST INFECTION (Patient taking differently: Apply 1 g topically 2 (two) times daily as needed (yeast). ) 15 g 2  . OXYGEN O2 3L at home.    . pravastatin (PRAVACHOL) 20 MG tablet TAKE ONE TABLET BY MOUTH EVERY DAY 90 tablet 0  . predniSONE (STERAPRED UNI-PAK 21 TAB) 10 MG (21) TBPK tablet Prednisone 60 mg daily for 3 days followed by  Prednisone 40 mg daily for 3 days followed by  Prednisone 20 mg daily for 3 days followed by  Prednisone 10 mg daily for 3 days. 39 tablet 0  . Probiotic Product (PROBIOTIC PO) Take by mouth.    Haig Prophet COMFORT INSULIN SYRINGE 30G X 1/2" 1 ML MISC USE FOUR (4) TIMES DAILY AS DIRECTED (Patient taking differently: Inject 1 Syringe into the skin 4 (four) times daily. ) 100 each 3  . tiZANidine (ZANAFLEX) 2 MG tablet Take 2-4 mg by mouth every 12 (twelve) hours as needed for muscle spasms.    [name] Kitchen torsemide (DEMADEX) 20 MG tablet Take 1 tablet (20 mg total) by mouth daily. 30 tablet 1   No current facility-administered medications on file prior to visit.     Allergies  Allergen Reactions  . Gabapentin Anaphylaxis  . Lyrica [Pregabalin] Shortness Of Breath    Trouble breathing  . Ketorolac Tromethamine Hives  . Lisinopril Cough    Family History  Problem Relation Age of Onset  . Heart attack Mother 51       Deceased  . Heart disease Mother   . Emphysema Mother   . Alcoholism Mother   . COPD Father 36       Deceased  .  Emphysema Father   . Alcoholism Father   . Esophageal varices Father   . Alcoholism Paternal Grandfather   . Diabetes Maternal Grandmother   . Heart disease Maternal Grandmother   . Lung cancer Maternal Grandfather   . Emphysema Maternal Grandfather   . Brain cancer Maternal Aunt   . Diabetes Sister   . Heart defect Sister   . Cancer Sister        was told her sister had cancer but beat it and dont know which one   . Heart defect Sister   . Obesity Son   . Breast cancer Maternal Aunt   . Colon cancer Neg Hx   . Esophageal cancer Neg Hx     Social History   Socioeconomic  History  . Marital status: Widowed    Spouse name: Not on file  . Number of children: 1  . Years of education: Not on file  . Highest education level: Not on file  Occupational History  . Occupation: Retired  Scientific laboratory technician  . Financial resource strain: Not on file  . Food insecurity:    Worry: Not on file    Inability: Not on file  . Transportation needs:    Medical: Not on file    Non-medical: Not on file  Tobacco Use  . Smoking status: Former Smoker    Packs/day: 2.50    Years: 45.00    Pack years: 112.50    Types: Cigarettes    Last attempt to quit: 07/21/2016    Years since quitting: 1.9  . Smokeless tobacco: Never Used  Substance and Sexual Activity  . Alcohol use: No  . Drug use: No  . Sexual activity: Never  Lifestyle  . Physical activity:    Days per week: Not on file    Minutes per session: Not on file  . Stress: Not on file  Relationships  . Social connections:    Talks on phone: Not on file    Gets together: Not on file    Attends religious service: Not on file    Active member of club or organization: Not on file    Attends meetings of clubs or organizations: Not on file    Relationship status: Not on file  Other Topics Concern  . Not on file  Social History Narrative  . Not on file   Review of Systems - See HPI.  All other ROS are negative.  BP 130/62   Pulse 95   Temp  98.9 F (37.2 C)   Resp 18   Ht '5\' 4"'$  (1.626 m)   Wt 267 lb (121.1 kg)   BMI 45.83 kg/m   Physical Exam Constitutional:      Appearance: She is obese. She is not ill-appearing.  HENT:     Head: Normocephalic and atraumatic.     Right Ear: Tympanic membrane normal.     Left Ear: Tympanic membrane normal.     Nose: Nose normal.     Mouth/Throat:     Mouth: Mucous membranes are moist.     Pharynx: Oropharynx is clear.  Eyes:     Conjunctiva/sclera: Conjunctivae normal.     Pupils: Pupils are equal, round, and reactive to light.  Cardiovascular:     Rate and Rhythm: Normal rate and regular rhythm.     Heart sounds: Normal heart sounds.  Pulmonary:     Comments: Effort increased with ambulation. Improves within a few minutes of rest. Patient remains on 4L O2 while walking and is able to cut back to 3L at rest.  Neurological:     General: No focal deficit present.     Mental Status: She is alert and oriented to person, place, and time.  Psychiatric:        Mood and Affect: Mood normal.     Recent Results (from the past 2160 hour(s))  Comprehensive metabolic panel     Status: Abnormal   Collection Time: 04/07/18  4:42 PM  Result Value Ref Range   Sodium 138 135 - 145 mmol/L   Potassium 4.4 3.5 - 5.1 mmol/L   Chloride 93 (L) 98 - 111 mmol/L   CO2 38 (H) 22 - 32 mmol/L   Glucose, Bld 156 (H) 70 - 99 mg/dL  BUN 16 8 - 23 mg/dL   Creatinine, Ser 1.38 (H) 0.44 - 1.00 mg/dL   Calcium 8.2 (L) 8.9 - 10.3 mg/dL   Total Protein 5.8 (L) 6.5 - 8.1 g/dL   Albumin 3.1 (L) 3.5 - 5.0 g/dL   AST 13 (L) 15 - 41 U/L   ALT 10 0 - 44 U/L   Alkaline Phosphatase 46 38 - 126 U/L   Total Bilirubin 0.8 0.3 - 1.2 mg/dL   GFR calc non Af Amer 39 (L) >60 mL/min   GFR calc Af Amer 46 (L) >60 mL/min    Comment: (NOTE) The eGFR has been calculated using the CKD EPI equation. This calculation has not been validated in all clinical situations. eGFR's persistently <60 mL/min signify possible Chronic  Kidney Disease.    Anion gap 7 5 - 15    Comment: Performed at [facility], [address] [place], [place] [address]  Brain natriuretic peptide     Status: Abnormal   Collection Time: 04/07/18  4:42 PM  Result Value Ref Range   B Natriuretic Peptide 597.0 (H) 0.0 - 100.0 pg/mL    Comment: Performed at [facility], [address] [place], [place] [address]  Troponin I - Once     Status: Abnormal   Collection Time: 04/07/18  4:42 PM  Result Value Ref Range   Troponin I 0.03 (HH) <0.03 ng/mL    Comment: CRITICAL RESULT CALLED TO, READ BACK BY AND VERIFIED WITH: OAKLEY,B ON 04/07/18 AT 1740 BY LOY,C Performed at [facility], [address] [place], [place] [address]   CBC with Differential     Status: Abnormal   Collection Time: 04/07/18  4:42 PM  Result Value Ref Range   WBC 4.5 4.0 - 10.5 K/uL   RBC 4.02 3.87 - 5.11 MIL/uL   Hemoglobin 9.6 (L) 12.0 - 15.0 g/dL   HCT 35.9 (L) 36.0 - 46.0 %   MCV 89.3 80.0 - 100.0 fL   MCH 23.9 (L) 26.0 - 34.0 pg   MCHC 26.7 (L) 30.0 - 36.0 g/dL   RDW 17.4 (H) 11.5 - 15.5 %   Platelets 49 (L) 150 - 400 K/uL    Comment: PLATELET COUNT CONFIRMED BY SMEAR SPECIMEN CHECKED FOR CLOTS    nRBC 0.0 0.0 - 0.2 %   Neutrophils Relative % 83 %   Neutro Abs 3.7 1.7 - 7.7 K/uL   Lymphocytes Relative 8 %   Lymphs Abs 0.4 (L) 0.7 - 4.0 K/uL   Monocytes Relative 9 %   Monocytes Absolute 0.4 0.1 - 1.0 K/uL   Eosinophils Relative 0 %   Eosinophils Absolute 0.0 0.0 - 0.5 K/uL   Basophils Relative 0 %   Basophils Absolute 0.0 0.0 - 0.1 K/uL   Immature Granulocytes 0 %   Abs Immature Granulocytes 0.02 0.00 - 0.07 K/uL    Comment: Performed at [facility], [address] [place], [place] [address]  CBG monitoring, ED     Status: Abnormal   Collection Time: 04/07/18  4:55 PM  Result Value Ref Range   Glucose-Capillary 140 (H) 70 - 99 mg/dL  Urinalysis, Routine w reflex microscopic     Status: Abnormal   Collection Time: 04/07/18  6:20 PM  Result Value  Ref Range   Color, Urine YELLOW YELLOW   APPearance HAZY (A) CLEAR   Specific Gravity, Urine 1.015 1.005 - 1.030   pH 6.0 5.0 - 8.0   Glucose, UA NEGATIVE NEGATIVE mg/dL   Hgb  urine dipstick SMALL (A) NEGATIVE   Bilirubin Urine NEGATIVE NEGATIVE   Ketones, ur NEGATIVE NEGATIVE mg/dL   Protein, ur 30 (A) NEGATIVE mg/dL   Nitrite NEGATIVE NEGATIVE   Leukocytes, UA NEGATIVE NEGATIVE   RBC / HPF 6-10 0 - 5 RBC/hpf   WBC, UA 6-10 0 - 5 WBC/hpf   Bacteria, UA RARE (A) NONE SEEN   Squamous Epithelial / LPF 11-20 0 - 5    Comment: Performed at [facility], [address], [place], [place] [address]  Glucose, capillary     Status: Abnormal   Collection Time: 04/07/18  9:06 PM  Result Value Ref Range   Glucose-Capillary 128 (H) 70 - 99 mg/dL   Comment 1 Notify RN    Comment 2 Document in Chart   Basic metabolic panel     Status: Abnormal   Collection Time: 04/08/18  6:45 AM  Result Value Ref Range   Sodium 140 135 - 145 mmol/L   Potassium 4.3 3.5 - 5.1 mmol/L   Chloride 95 (L) 98 - 111 mmol/L   CO2 41 (H) 22 - 32 mmol/L   Glucose, Bld 114 (H) 70 - 99 mg/dL   BUN 16 8 - 23 mg/dL   Creatinine, Ser 1.35 (H) 0.44 - 1.00 mg/dL   Calcium 8.1 (L) 8.9 - 10.3 mg/dL   GFR calc non Af Amer 41 (L) >60 mL/min   GFR calc Af Amer 47 (L) >60 mL/min    Comment: (NOTE) The eGFR has been calculated using the CKD EPI equation. This calculation has not been validated in all clinical situations. eGFR's persistently <60 mL/min signify possible Chronic Kidney Disease.    Anion gap 4 (L) 5 - 15    Comment: Performed at [facility], [address] [place], [place] [address]  Glucose, capillary     Status: None   Collection Time: 04/08/18  8:01 AM  Result Value Ref Range   Glucose-Capillary 92 70 - 99 mg/dL   Comment 1 Notify RN    Comment 2 Document in Chart   ECHOCARDIOGRAM COMPLETE     Status: None   Collection Time: 04/08/18 11:37 AM  Result Value Ref Range   Weight 4,797.21 oz   Height 64 in     BP 110/55 mmHg  Glucose, capillary     Status: Abnormal   Collection Time: 04/08/18 11:55 AM  Result Value Ref Range   Glucose-Capillary 139 (H) 70 - 99 mg/dL   Comment 1 Notify RN    Comment 2 Document in Chart   Glucose, capillary     Status: Abnormal   Collection Time: 04/08/18  4:48 PM  Result Value Ref Range   Glucose-Capillary 203 (H) 70 - 99 mg/dL   Comment 1 Notify RN    Comment 2 Document in Chart   Glucose, capillary     Status: Abnormal   Collection Time: 04/08/18 10:14 PM  Result Value Ref Range   Glucose-Capillary 282 (H) 70 - 99 mg/dL   Comment 1 Notify RN    Comment 2 Document in Chart   Basic metabolic panel     Status: Abnormal   Collection Time: 04/09/18  5:38 AM  Result Value Ref Range   Sodium 139 135 - 145 mmol/L   Potassium 4.2 3.5 - 5.1 mmol/L   Chloride 90 (L) 98 - 111 mmol/L   CO2 40 (H) 22 - 32 mmol/L   Glucose, Bld 260 (H) 70 - 99 mg/dL   BUN 23 8 -  23 mg/dL   Creatinine, Ser 1.58 (H) 0.44 - 1.00 mg/dL   Calcium 8.5 (L) 8.9 - 10.3 mg/dL   GFR calc non Af Amer 34 (L) >60 mL/min   GFR calc Af Amer 39 (L) >60 mL/min    Comment: (NOTE) The eGFR has been calculated using the CKD EPI equation. This calculation has not been validated in all clinical situations. eGFR's persistently <60 mL/min signify possible Chronic Kidney Disease.    Anion gap 9 5 - 15    Comment: Performed at [facility], [address], [place], [place] [address]  Magnesium     Status: None   Collection Time: 04/09/18  5:38 AM  Result Value Ref Range   Magnesium 1.7 1.7 - 2.4 mg/dL    Comment: Performed at [facility], [address] [place], [place] [address]  Vitamin B12     Status: None   Collection Time: 04/09/18  5:39 AM  Result Value Ref Range   Vitamin B-12 368 180 - 914 pg/mL    Comment: (NOTE) This assay is not validated for testing neonatal or myeloproliferative syndrome specimens for Vitamin B12 levels. Performed at [facility], [address]  [place], [place] [address]   Folate     Status: Abnormal   Collection Time: 04/09/18  5:39 AM  Result Value Ref Range   Folate 5.9 (L) >5.9 ng/mL    Comment: Performed at [facility], [address], [place], [place] [address]  Hemoglobin A1c     Status: None   Collection Time: 04/09/18  5:39 AM  Result Value Ref Range   Hgb A1c MFr Bld 5.2 4.8 - 5.6 %    Comment: (NOTE) Pre diabetes:          5.7%-6.4% Diabetes:              >6.4% Glycemic control for   <7.0% adults with diabetes    Mean Plasma Glucose 102.54 mg/dL    Comment: Performed at [facility] [address], [place], [place] [address]  Glucose, capillary     Status: Abnormal   Collection Time: 04/09/18  7:59 AM  Result Value Ref Range   Glucose-Capillary 270 (H) 70 - 99 mg/dL   Comment 1 Notify RN    Comment 2 Document in Chart   Glucose, capillary     Status: Abnormal   Collection Time: 04/09/18 11:26 AM  Result Value Ref Range   Glucose-Capillary 225 (H) 70 - 99 mg/dL   Comment 1 Notify RN    Comment 2 Document in Chart   Glucose, capillary     Status: Abnormal   Collection Time: 04/09/18  4:41 PM  Result Value Ref Range   Glucose-Capillary 222 (H) 70 - 99 mg/dL   Comment 1 Notify RN    Comment 2 Document in Chart   Glucose, capillary     Status: Abnormal   Collection Time: 04/09/18 10:55 PM  Result Value Ref Range   Glucose-Capillary 224 (H) 70 - 99 mg/dL  Basic metabolic panel     Status: Abnormal   Collection Time: 04/10/18  5:46 AM  Result Value Ref Range   Sodium 138 135 - 145 mmol/L   Potassium 5.1 3.5 - 5.1 mmol/L    Comment: DELTA CHECK NOTED   Chloride 89 (L) 98 - 111 mmol/L   CO2 40 (H) 22 - 32 mmol/L   Glucose, Bld 210 (H) 70 - 99 mg/dL   BUN 29 (H) 8 - 23  mg/dL   Creatinine, Ser 1.37 (H) 0.44 - 1.00 mg/dL   Calcium 8.7 (L) 8.9 - 10.3 mg/dL   GFR calc non Af Amer 40 (L) >60 mL/min   GFR calc Af Amer 46 (L) >60 mL/min    Comment: (NOTE) The eGFR has been calculated using the CKD EPI  equation. This calculation has not been validated in all clinical situations. eGFR's persistently <60 mL/min signify possible Chronic Kidney Disease.    Anion gap 9 5 - 15    Comment: Performed at [facility], [address], [place], [place] [address]  Glucose, capillary     Status: Abnormal   Collection Time: 04/10/18  8:11 AM  Result Value Ref Range   Glucose-Capillary 167 (H) 70 - 99 mg/dL  Glucose, capillary     Status: Abnormal   Collection Time: 04/10/18 11:38 AM  Result Value Ref Range   Glucose-Capillary 206 (H) 70 - 99 mg/dL   Comment 1 Notify RN   Glucose, capillary     Status: Abnormal   Collection Time: 04/10/18  4:58 PM  Result Value Ref Range   Glucose-Capillary 172 (H) 70 - 99 mg/dL   Comment 1 Notify RN    Comment 2 Document in Chart   Glucose, capillary     Status: Abnormal   Collection Time: 04/10/18 10:19 PM  Result Value Ref Range   Glucose-Capillary 182 (H) 70 - 99 mg/dL  Basic metabolic panel     Status: Abnormal   Collection Time: 04/11/18  5:09 AM  Result Value Ref Range   Sodium 138 135 - 145 mmol/L   Potassium 4.5 3.5 - 5.1 mmol/L   Chloride 88 (L) 98 - 111 mmol/L   CO2 40 (H) 22 - 32 mmol/L   Glucose, Bld 176 (H) 70 - 99 mg/dL   BUN 41 (H) 8 - 23 mg/dL   Creatinine, Ser 1.59 (H) 0.44 - 1.00 mg/dL   Calcium 8.6 (L) 8.9 - 10.3 mg/dL   GFR calc non Af Amer 33 (L) >60 mL/min   GFR calc Af Amer 39 (L) >60 mL/min    Comment: (NOTE) The eGFR has been calculated using the CKD EPI equation. This calculation has not been validated in all clinical situations. eGFR's persistently <60 mL/min signify possible Chronic Kidney Disease.    Anion gap 10 5 - 15    Comment: Performed at [facility], [address], [place], [place] [address]  Glucose, capillary     Status: Abnormal   Collection Time: 04/11/18  7:30 AM  Result Value Ref Range   Glucose-Capillary 155 (H) 70 - 99 mg/dL  Glucose, capillary     Status: Abnormal   Collection Time: 04/11/18 11:09  AM  Result Value Ref Range   Glucose-Capillary 222 (H) 70 - 99 mg/dL  CBC w/Diff     Status: Abnormal   Collection Time: 04/18/18 11:35 AM  Result Value Ref Range   WBC 9.5 4.0 - 10.5 K/uL   RBC 4.77 3.87 - 5.11 Mil/uL   Hemoglobin 11.8 (L) 12.0 - 15.0 g/dL   HCT 37.8 36.0 - 46.0 %   MCV 79.4 78.0 - 100.0 fl   MCHC 31.1 30.0 - 36.0 g/dL   RDW 18.9 (H) 11.5 - 15.5 %   Platelets 87.0 (L) 150.0 - 400.0 K/uL   Neutrophils Relative % (H) 43.0 - 77.0 %    90.2 Manual smear review agrees with instrument differential.   Lymphocytes Relative 4.9 (L) 12.0 - 46.0 %  Monocytes Relative 4.2 3.0 - 12.0 %   Eosinophils Relative 0.5 0.0 - 5.0 %   Basophils Relative 0.2 0.0 - 3.0 %   Neutro Abs 8.5 (H) 1.4 - 7.7 K/uL   Lymphs Abs 0.5 (L) 0.7 - 4.0 K/uL   Monocytes Absolute 0.4 0.1 - 1.0 K/uL   Eosinophils Absolute 0.0 0.0 - 0.7 K/uL   Basophils Absolute 0.0 0.0 - 0.1 K/uL  Comp Met (CMET)     Status: Abnormal   Collection Time: 04/18/18 11:35 AM  Result Value Ref Range   Sodium 138 135 - 145 mEq/L   Potassium 4.6 3.5 - 5.1 mEq/L   Chloride 91 (L) 96 - 112 mEq/L   CO2 42 (H) 19 - 32 mEq/L   Glucose, Bld 76 70 - 99 mg/dL   BUN 38 (H) 6 - 23 mg/dL   Creatinine, Ser 1.20 0.40 - 1.20 mg/dL   Total Bilirubin 0.6 0.2 - 1.2 mg/dL   Alkaline Phosphatase 49 39 - 117 U/L   AST 16 0 - 37 U/L   ALT 29 0 - 35 U/L   Total Protein 6.2 6.0 - 8.3 g/dL   Albumin 3.8 3.5 - 5.2 g/dL   Calcium 9.2 8.4 - 10.5 mg/dL   GFR 47.96 (L) >60.00 mL/min  Basic metabolic panel     Status: Abnormal   Collection Time: 06/08/18  9:15 PM  Result Value Ref Range   Sodium 139 135 - 145 mmol/L   Potassium 4.2 3.5 - 5.1 mmol/L   Chloride 94 (L) 98 - 111 mmol/L   CO2 38 (H) 22 - 32 mmol/L   Glucose, Bld 121 (H) 70 - 99 mg/dL   BUN 29 (H) 8 - 23 mg/dL   Creatinine, Ser 1.57 (H) 0.44 - 1.00 mg/dL   Calcium 8.9 8.9 - 10.3 mg/dL   GFR calc non Af Amer 34 (L) >60 mL/min   GFR calc Af Amer 40 (L) >60 mL/min   Anion gap 7 5  - 15    Comment: Performed at [facility], [place] [address], [place], [place] [address]  Brain natriuretic peptide     Status: Abnormal   Collection Time: 06/08/18  9:15 PM  Result Value Ref Range   B Natriuretic Peptide 326.6 (H) 0.0 - 100.0 pg/mL    Comment: Performed at [facility], [place] [address] [place], [place] [address]  I-stat troponin, ED     Status: None   Collection Time: 06/08/18  9:54 PM  Result Value Ref Range   Troponin i, poc 0.01 0.00 - 0.08 ng/mL   Comment 3            Comment: Due to the release kinetics of cTnI, a negative result within the first hours of the onset of symptoms does not rule out myocardial infarction with certainty. If myocardial infarction is still suspected, repeat the test at appropriate intervals.   CBC     Status: Abnormal   Collection Time: 06/09/18  4:38 AM  Result Value Ref Range   WBC 4.4 4.0 - 10.5 K/uL   RBC 3.51 (L) 3.87 - 5.11 MIL/uL   Hemoglobin 9.2 (L) 12.0 - 15.0 g/dL   HCT 32.1 (L) 36.0 - 46.0 %   MCV 91.5 80.0 - 100.0 fL   MCH 26.2 26.0 - 34.0 pg   MCHC 28.7 (L) 30.0 - 36.0 g/dL   RDW 17.6 (H) 11.5 - 15.5 %   Platelets 54 (L) 150 - 400  K/uL    Comment: Immature Platelet Fraction may be clinically indicated, consider ordering this additional test [id number] CONSISTENT WITH PREVIOUS RESULT    nRBC 0.0 0.0 - 0.2 %    Comment: Performed at [facility], [place] [address] [place], [place] [address]  HIV antibody     Status: None   Collection Time: 06/09/18  4:38 AM  Result Value Ref Range   HIV Screen 4th Generation wRfx Non Reactive Non Reactive    Comment: (NOTE) Performed At: [facility] [address] [place], [place] [id number] Rush Farmer MD [id number]   Culture, blood (routine x 2) Call MD if unable to obtain prior to antibiotics being given     Status: None   Collection Time: 06/09/18  4:38 AM  Result Value Ref Range   Specimen Description       BLOOD RIGHT HAND Performed at [facility] [address], [place], [place] [address]    Special Requests      BOTTLES DRAWN AEROBIC AND ANAEROBIC Blood Culture adequate volume Performed at [facility] [address], [place], [place] [address]    Culture      NO GROWTH 5 DAYS Performed at [facility], [place] [address], [place], [place] [address]    Report Status 06/14/2018 FINAL   Culture, blood (routine x 2) Call MD if unable to obtain prior to antibiotics being given     Status: None   Collection Time: 06/09/18  5:03 AM  Result Value Ref Range   Specimen Description      BLOOD BLOOD RIGHT FOREARM Performed at [facility] [address], [place], [place] [address]    Special Requests      BOTTLES DRAWN AEROBIC AND ANAEROBIC Blood Culture results may not be optimal due to an excessive volume of blood received in culture bottles Performed at [place] [address], [place], [place] [address]    Culture      NO GROWTH 5 DAYS Performed at [facility], [place] [address], [place], [place] [address]    Report Status 06/14/2018 FINAL   CBG monitoring, ED     Status: Abnormal   Collection Time: 06/09/18  8:39 AM  Result Value Ref Range   Glucose-Capillary 214 (H) 70 - 99 mg/dL  Influenza panel by PCR (type A & B)     Status: None   Collection Time: 06/09/18  8:40 AM  Result Value Ref Range   Influenza A By PCR NEGATIVE NEGATIVE   Influenza B By PCR NEGATIVE NEGATIVE    Comment: (NOTE) The Xpert Xpress Flu assay is intended as an aid in the diagnosis of  influenza and should not be used as a sole basis for treatment.  This  assay is FDA approved for nasopharyngeal swab specimens only. Nasal  washings and aspirates are unacceptable for Xpert Xpress Flu testing. Performed at [facility], [place] [address], [place], [place] [address]   CBG monitoring, ED     Status:  None   Collection Time: 06/09/18 11:44 AM  Result Value Ref Range   Glucose-Capillary 88 70 - 99 mg/dL  Glucose, capillary     Status: Abnormal   Collection Time: 06/09/18  4:48 PM  Result Value Ref Range   Glucose-Capillary 236 (H) 70 - 99 mg/dL  Basic metabolic panel     Status: Abnormal   Collection Time: 06/09/18  7:08 PM  Result  Value Ref Range   Sodium 137 135 - 145 mmol/L   Potassium 4.1 3.5 - 5.1 mmol/L   Chloride 90 (L) 98 - 111 mmol/L   CO2 35 (H) 22 - 32 mmol/L   Glucose, Bld 270 (H) 70 - 99 mg/dL   BUN 36 (H) 8 - 23 mg/dL   Creatinine, Ser 1.40 (H) 0.44 - 1.00 mg/dL   Calcium 8.7 (L) 8.9 - 10.3 mg/dL   GFR calc non Af Amer 40 (L) >60 mL/min   GFR calc Af Amer 46 (L) >60 mL/min   Anion gap 12 5 - 15    Comment: Performed at [facility], [place] [address] [place], [place] [address]  Glucose, capillary     Status: Abnormal   Collection Time: 06/09/18  8:56 PM  Result Value Ref Range   Glucose-Capillary 282 (H) 70 - 99 mg/dL  Basic metabolic panel     Status: Abnormal   Collection Time: 06/10/18  7:29 AM  Result Value Ref Range   Sodium 135 135 - 145 mmol/L   Potassium 4.4 3.5 - 5.1 mmol/L   Chloride 89 (L) 98 - 111 mmol/L   CO2 36 (H) 22 - 32 mmol/L   Glucose, Bld 199 (H) 70 - 99 mg/dL   BUN 38 (H) 8 - 23 mg/dL   Creatinine, Ser 1.24 (H) 0.44 - 1.00 mg/dL   Calcium 9.1 8.9 - 10.3 mg/dL   GFR calc non Af Amer 46 (L) >60 mL/min   GFR calc Af Amer 53 (L) >60 mL/min   Anion gap 10 5 - 15    Comment: Performed at [facility], [place] [address] [place], [place] [address]  CBC     Status: Abnormal   Collection Time: 06/10/18  7:29 AM  Result Value Ref Range   WBC 4.6 4.0 - 10.5 K/uL   RBC 3.71 (L) 3.87 - 5.11 MIL/uL   Hemoglobin 9.8 (L) 12.0 - 15.0 g/dL   HCT 33.2 (L) 36.0 - 46.0 %   MCV 89.5 80.0 - 100.0 fL   MCH 26.4 26.0 - 34.0 pg   MCHC 29.5 (L) 30.0 - 36.0 g/dL   RDW 17.2 (H) 11.5 - 15.5 %   Platelets 64 (L) 150 - 400  K/uL    Comment: REPEATED TO VERIFY Immature Platelet Fraction may be clinically indicated, consider ordering this additional test [id number] CONSISTENT WITH PREVIOUS RESULT    nRBC 0.0 0.0 - 0.2 %    Comment: Performed at [facility], [place] [address] [place], [place] [address]  Glucose, capillary     Status: Abnormal   Collection Time: 06/10/18  7:37 AM  Result Value Ref Range   Glucose-Capillary 213 (H) 70 - 99 mg/dL  Glucose, capillary     Status: Abnormal   Collection Time: 06/10/18 12:02 PM  Result Value Ref Range   Glucose-Capillary 216 (H) 70 - 99 mg/dL  Glucose, capillary     Status: Abnormal   Collection Time: 06/10/18  4:15 PM  Result Value Ref Range   Glucose-Capillary 256 (H) 70 - 99 mg/dL  Basic metabolic panel     Status: Abnormal   Collection Time: 06/10/18  8:58 PM  Result Value Ref Range   Sodium 135 135 - 145 mmol/L   Potassium 3.6 3.5 - 5.1 mmol/L    Comment: DELTA CHECK NOTED   Chloride 87 (L) 98 - 111 mmol/L   CO2 35 (H) 22 - 32 mmol/L  Glucose, Bld 219 (H) 70 - 99 mg/dL   BUN 49 (H) 8 - 23 mg/dL   Creatinine, Ser 1.28 (H) 0.44 - 1.00 mg/dL   Calcium 9.2 8.9 - 10.3 mg/dL   GFR calc non Af Amer 44 (L) >60 mL/min   GFR calc Af Amer 51 (L) >60 mL/min   Anion gap 13 5 - 15    Comment: Performed at [facility], [place] [address] [place], [place] [address]  Magnesium     Status: None   Collection Time: 06/10/18  8:58 PM  Result Value Ref Range   Magnesium 2.0 1.7 - 2.4 mg/dL    Comment: Performed at [facility], [place] [address] [place], [place] [address]  Glucose, capillary     Status: Abnormal   Collection Time: 06/10/18  9:42 PM  Result Value Ref Range   Glucose-Capillary 207 (H) 70 - 99 mg/dL  CBC     Status: Abnormal   Collection Time: 06/11/18  4:24 AM  Result Value Ref Range   WBC 4.7 4.0 - 10.5 K/uL   RBC 4.01 3.87 - 5.11 MIL/uL   Hemoglobin 10.5 (L) 12.0 - 15.0 g/dL   HCT 35.1 (L) 36.0  - 46.0 %   MCV 87.5 80.0 - 100.0 fL   MCH 26.2 26.0 - 34.0 pg   MCHC 29.9 (L) 30.0 - 36.0 g/dL   RDW 17.1 (H) 11.5 - 15.5 %   Platelets 67 (L) 150 - 400 K/uL    Comment: REPEATED TO VERIFY Immature Platelet Fraction may be clinically indicated, consider ordering this additional test [id number] CONSISTENT WITH PREVIOUS RESULT    nRBC 0.0 0.0 - 0.2 %    Comment: Performed at [facility], [place] [address], [place], [place] [address]  Basic metabolic panel     Status: Abnormal   Collection Time: 06/11/18  4:24 AM  Result Value Ref Range   Sodium 135 135 - 145 mmol/L   Potassium 3.8 3.5 - 5.1 mmol/L   Chloride 89 (L) 98 - 111 mmol/L   CO2 33 (H) 22 - 32 mmol/L   Glucose, Bld 215 (H) 70 - 99 mg/dL   BUN 49 (H) 8 - 23 mg/dL   Creatinine, Ser 1.20 (H) 0.44 - 1.00 mg/dL   Calcium 8.9 8.9 - 10.3 mg/dL   GFR calc non Af Amer 48 (L) >60 mL/min   GFR calc Af Amer 55 (L) >60 mL/min   Anion gap 13 5 - 15    Comment: Performed at [facility], [place] [address], [place],  [address]  Glucose, capillary     Status: Abnormal   Collection Time: 06/11/18  7:29 AM  Result Value Ref Range   Glucose-Capillary 215 (H) 70 - 99 mg/dL  Glucose, capillary     Status: Abnormal   Collection Time: 06/11/18 12:34 PM  Result Value Ref Range   Glucose-Capillary 220 (H) 70 - 99 mg/dL  Glucose, capillary     Status: Abnormal   Collection Time: 06/11/18  4:06 PM  Result Value Ref Range   Glucose-Capillary 247 (H) 70 - 99 mg/dL  Glucose, capillary     Status: Abnormal   Collection Time: 06/11/18  9:53 PM  Result Value Ref Range   Glucose-Capillary 242 (H) 70 - 99 mg/dL  CBC     Status: Abnormal   Collection Time: 06/12/18  4:10 AM  Result Value Ref Range   WBC 4.3 4.0 - 10.5 K/uL  RBC 4.07 3.87 - 5.11 MIL/uL   Hemoglobin 10.5 (L) 12.0 - 15.0 g/dL   HCT 35.7 (L) 36.0 - 46.0 %   MCV 87.7 80.0 - 100.0 fL   MCH 25.8 (L) 26.0 - 34.0 pg   MCHC 29.4 (L) 30.0 - 36.0  g/dL   RDW 16.7 (H) 11.5 - 15.5 %   Platelets 70 (L) 150 - 400 K/uL    Comment: REPEATED TO VERIFY Immature Platelet Fraction may be clinically indicated, consider ordering this additional test [id number] CONSISTENT WITH PREVIOUS RESULT    nRBC 0.0 0.0 - 0.2 %    Comment: Performed at [facility], [place] [address] [place], [place] [address]  Basic metabolic panel     Status: Abnormal   Collection Time: 06/12/18  4:10 AM  Result Value Ref Range   Sodium 135 135 - 145 mmol/L   Potassium 4.1 3.5 - 5.1 mmol/L   Chloride 89 (L) 98 - 111 mmol/L   CO2 35 (H) 22 - 32 mmol/L   Glucose, Bld 220 (H) 70 - 99 mg/dL   BUN 60 (H) 8 - 23 mg/dL   Creatinine, Ser 1.46 (H) 0.44 - 1.00 mg/dL   Calcium 8.6 (L) 8.9 - 10.3 mg/dL   GFR calc non Af Amer 38 (L) >60 mL/min   GFR calc Af Amer 44 (L) >60 mL/min   Anion gap 11 5 - 15    Comment: Performed at [facility], [place] [address], [place], [place] [address]  Hemoglobin A1c     Status: None   Collection Time: 06/12/18  4:10 AM  Result Value Ref Range   Hgb A1c MFr Bld 5.6 4.8 - 5.6 %    Comment: (NOTE) Pre diabetes:          5.7%-6.4% Diabetes:              >6.4% Glycemic control for   <7.0% adults with diabetes    Mean Plasma Glucose 114.02 mg/dL    Comment: Performed at [place] [address], [place], [place] [address]  Glucose, capillary     Status: Abnormal   Collection Time: 06/12/18  7:53 AM  Result Value Ref Range   Glucose-Capillary 198 (H) 70 - 99 mg/dL  Glucose, capillary     Status: Abnormal   Collection Time: 06/12/18 11:41 AM  Result Value Ref Range   Glucose-Capillary 208 (H) 70 - 99 mg/dL  Glucose, capillary     Status: Abnormal   Collection Time: 06/12/18  4:55 PM  Result Value Ref Range   Glucose-Capillary 262 (H) 70 - 99 mg/dL  Glucose, capillary     Status: Abnormal   Collection Time: 06/12/18  8:50 PM  Result Value Ref Range   Glucose-Capillary 265 (H) 70 - 99 mg/dL   CBC     Status: Abnormal   Collection Time: 06/13/18  4:31 AM  Result Value Ref Range   WBC 4.7 4.0 - 10.5 K/uL   RBC 4.16 3.87 - 5.11 MIL/uL   Hemoglobin 10.9 (L) 12.0 - 15.0 g/dL   HCT 37.0 36.0 - 46.0 %   MCV 88.9 80.0 - 100.0 fL   MCH 26.2 26.0 - 34.0 pg   MCHC 29.5 (L) 30.0 - 36.0 g/dL   RDW 16.6 (H) 11.5 - 15.5 %   Platelets 70 (L) 150 - 400 K/uL    Comment: Immature Platelet Fraction may be clinically indicated, consider ordering this additional test [id number] CONSISTENT WITH PREVIOUS  RESULT    nRBC 0.0 0.0 - 0.2 %    Comment: Performed at [facility], [place] [address], [place], [place] [address]  Basic metabolic panel     Status: Abnormal   Collection Time: 06/13/18  4:31 AM  Result Value Ref Range   Sodium 134 (L) 135 - 145 mmol/L   Potassium 3.8 3.5 - 5.1 mmol/L   Chloride 87 (L) 98 - 111 mmol/L   CO2 35 (H) 22 - 32 mmol/L   Glucose, Bld 269 (H) 70 - 99 mg/dL   BUN 66 (H) 8 - 23 mg/dL   Creatinine, Ser 1.52 (H) 0.44 - 1.00 mg/dL   Calcium 8.4 (L) 8.9 - 10.3 mg/dL   GFR calc non Af Amer 36 (L) >60 mL/min   GFR calc Af Amer 42 (L) >60 mL/min   Anion gap 12 5 - 15    Comment: Performed at [facility], [place] [address] [place], [place] [address]  D-dimer, quantitative (not at [facility])     Status: Abnormal   Collection Time: 06/13/18  7:18 AM  Result Value Ref Range   D-Dimer, Quant 2.04 (H) 0.00 - 0.50 ug/mL-FEU    Comment: (NOTE) At the manufacturer cut-off of 0.50 ug/mL FEU, this assay has been documented to exclude PE with a sensitivity and negative predictive value of 97 to 99%.  At this time, this assay has not been approved by the FDA to exclude DVT/VTE. Results should be correlated with clinical presentation. Performed at [facility], [place] [address] [place], [place] [address]   Glucose, capillary     Status: Abnormal   Collection Time: 06/13/18  7:41 AM  Result Value Ref Range   Glucose-Capillary  224 (H) 70 - 99 mg/dL  Glucose, capillary     Status: Abnormal   Collection Time: 06/13/18 12:10 PM  Result Value Ref Range   Glucose-Capillary 250 (H) 70 - 99 mg/dL  Glucose, capillary     Status: Abnormal   Collection Time: 06/13/18  5:00 PM  Result Value Ref Range   Glucose-Capillary 214 (H) 70 - 99 mg/dL  Glucose, capillary     Status: Abnormal   Collection Time: 06/13/18  9:11 PM  Result Value Ref Range   Glucose-Capillary 249 (H) 70 - 99 mg/dL   Comment 1 Notify RN   CBC     Status: Abnormal   Collection Time: 06/14/18  4:05 AM  Result Value Ref Range   WBC 5.7 4.0 - 10.5 K/uL   RBC 4.35 3.87 - 5.11 MIL/uL   Hemoglobin 11.5 (L) 12.0 - 15.0 g/dL   HCT 38.5 36.0 - 46.0 %   MCV 88.5 80.0 - 100.0 fL   MCH 26.4 26.0 - 34.0 pg   MCHC 29.9 (L) 30.0 - 36.0 g/dL   RDW 16.3 (H) 11.5 - 15.5 %   Platelets 74 (L) 150 - 400 K/uL    Comment: REPEATED TO VERIFY Immature Platelet Fraction may be clinically indicated, consider ordering this additional test [id number] CONSISTENT WITH PREVIOUS RESULT    nRBC 0.0 0.0 - 0.2 %    Comment: Performed at [facility], [place] [address] [place],  [address]  Basic metabolic panel     Status: Abnormal   Collection Time: 06/14/18  4:05 AM  Result Value Ref Range   Sodium 134 (L) 135 - 145 mmol/L   Potassium 4.2 3.5 - 5.1 mmol/L   Chloride 90 (L) 98 - 111 mmol/L  CO2 34 (H) 22 - 32 mmol/L   Glucose, Bld 236 (H) 70 - 99 mg/dL   BUN 56 (H) 8 - 23 mg/dL   Creatinine, Ser 1.28 (H) 0.44 - 1.00 mg/dL   Calcium 8.6 (L) 8.9 - 10.3 mg/dL   GFR calc non Af Amer 44 (L) >60 mL/min   GFR calc Af Amer 51 (L) >60 mL/min   Anion gap 10 5 - 15    Comment: Performed at [facility], [place] [address] [place], [place] [address]  Glucose, capillary     Status: Abnormal   Collection Time: 06/14/18  7:56 AM  Result Value Ref Range   Glucose-Capillary 223 (H) 70 - 99 mg/dL  Glucose, capillary     Status: Abnormal    Collection Time: 06/14/18 12:01 PM  Result Value Ref Range   Glucose-Capillary 273 (H) 70 - 99 mg/dL  Glucose, capillary     Status: Abnormal   Collection Time: 06/14/18  5:10 PM  Result Value Ref Range   Glucose-Capillary 214 (H) 70 - 99 mg/dL  Glucose, capillary     Status: Abnormal   Collection Time: 06/14/18 10:25 PM  Result Value Ref Range   Glucose-Capillary 150 (H) 70 - 99 mg/dL  CBC     Status: Abnormal   Collection Time: 06/15/18  4:11 AM  Result Value Ref Range   WBC 5.5 4.0 - 10.5 K/uL   RBC 4.48 3.87 - 5.11 MIL/uL   Hemoglobin 11.4 (L) 12.0 - 15.0 g/dL   HCT 39.1 36.0 - 46.0 %   MCV 87.3 80.0 - 100.0 fL   MCH 25.4 (L) 26.0 - 34.0 pg   MCHC 29.2 (L) 30.0 - 36.0 g/dL   RDW 16.5 (H) 11.5 - 15.5 %   Platelets 76 (L) 150 - 400 K/uL    Comment: REPEATED TO VERIFY Immature Platelet Fraction may be clinically indicated, consider ordering this additional test [id number] CONSISTENT WITH PREVIOUS RESULT    nRBC 0.0 0.0 - 0.2 %    Comment: Performed at [facility], [place] [address] [place], [place] [address]  Basic metabolic panel     Status: Abnormal   Collection Time: 06/15/18  4:11 AM  Result Value Ref Range   Sodium 135 135 - 145 mmol/L   Potassium 5.0 3.5 - 5.1 mmol/L   Chloride 92 (L) 98 - 111 mmol/L   CO2 33 (H) 22 - 32 mmol/L   Glucose, Bld 220 (H) 70 - 99 mg/dL   BUN 54 (H) 8 - 23 mg/dL   Creatinine, Ser 1.18 (H) 0.44 - 1.00 mg/dL   Calcium 8.6 (L) 8.9 - 10.3 mg/dL   GFR calc non Af Amer 49 (L) >60 mL/min   GFR calc Af Amer 56 (L) >60 mL/min   Anion gap 10 5 - 15    Comment: Performed at [facility], [place] [address] [place], [place] [address]  Glucose, capillary     Status: Abnormal   Collection Time: 06/15/18  7:50 AM  Result Value Ref Range   Glucose-Capillary 178 (H) 70 - 99 mg/dL  Glucose, capillary     Status: Abnormal   Collection Time: 06/15/18 11:38 AM  Result Value Ref Range   Glucose-Capillary 214 (H) 70 -  99 mg/dL  CBC w/Diff     Status: Abnormal   Collection Time: 06/21/18  2:33 PM  Result Value Ref Range   WBC 13.6 (H) 4.0 - 10.5 K/uL   RBC 4.39  3.87 - 5.11 Mil/uL   Hemoglobin 11.4 (L) 12.0 - 15.0 g/dL   HCT 36.4 36.0 - 46.0 %   MCV 82.9 78.0 - 100.0 fl   MCHC 31.4 30.0 - 36.0 g/dL   RDW 18.5 (H) 11.5 - 15.5 %   Platelets 96.0 (L) 150.0 - 400.0 K/uL   Neutrophils Relative % 94.8 (H) 43.0 - 77.0 %    Comment: Specimen too old for Manual Differential to be performed.   Lymphocytes Relative 2.1 (L) 12.0 - 46.0 %   Monocytes Relative 2.6 (L) 3.0 - 12.0 %   Eosinophils Relative 0.2 0.0 - 5.0 %   Basophils Relative 0.3 0.0 - 3.0 %   Neutro Abs 12.9 (H) 1.4 - 7.7 K/uL   Lymphs Abs 0.3 (L) 0.7 - 4.0 K/uL   Monocytes Absolute 0.4 0.1 - 1.0 K/uL   Eosinophils Absolute 0.0 0.0 - 0.7 K/uL   Basophils Absolute 0.0 0.0 - 0.1 K/uL  Basic metabolic panel     Status: Abnormal   Collection Time: 06/21/18  2:33 PM  Result Value Ref Range   Sodium 138 135 - 145 mEq/L   Potassium 4.6 3.5 - 5.1 mEq/L   Chloride 92 (L) 96 - 112 mEq/L   CO2 41 (H) 19 - 32 mEq/L   Glucose, Bld 286 (H) 70 - 99 mg/dL   BUN 50 (H) 6 - 23 mg/dL   Creatinine, Ser 1.15 0.40 - 1.20 mg/dL   Calcium 8.4 8.4 - 10.5 mg/dL   GFR 47.37 (L) >60.00 mL/min    Assessment/Plan: 1. Chronic respiratory failure with hypoxia and hypercapnia (HCC) 2. COPD GOLDIII with min reversibility  Lungs CTAB. She is saturating fine with rest but with exertion she is dropping into mid 80s even with 4L O2. Suspect combination of her chronic respiratory failure and obesity hypoventilation syndrome. Spoke with her Pulmonologist Dr. Melvyn Novas, who states she is fine to remain OP if O2 stays in mid 80s with exertion. Follow-up scheduled with Pulmonology tomorrow afternoon. Strict ER precautions reviewed with patient.  - CBC w/Diff - Basic metabolic panel  3. CKD (chronic kidney disease), stage III (HCC) Repeat BMP today. If stable, will consider restarting  ARB with instruction that must be held during episodes of illness.  - CBC w/Diff - Basic metabolic panel   [name], PA-C

## 2018-06-21 NOTE — Telephone Encounter (Signed)
Copied from [place] [id number]. Topic: Quick Communication - Home Health Verbal Orders >> Jun 21, 2018  3:57 PM Bea Graff, NT wrote: Caller/AgencyBerna Spare at [facility] Number: [phone] Requesting OT/PT/Skilled Nursing/Social Work: Nursing Frequency: Medication teaching, diet teaching and home health aid. 2 times a week for 3 weeks and 2 PRN.

## 2018-06-22 ENCOUNTER — Inpatient Hospital Stay: Payer: Medicare Other | Admitting: Internal Medicine

## 2018-06-22 LAB — CBC WITH DIFFERENTIAL/PLATELET
Basophils Absolute: 0 10*3/uL (ref 0.0–0.1)
Basophils Relative: 0.3 % (ref 0.0–3.0)
Eosinophils Absolute: 0 10*3/uL (ref 0.0–0.7)
Eosinophils Relative: 0.2 % (ref 0.0–5.0)
HCT: 36.4 % (ref 36.0–46.0)
HEMOGLOBIN: 11.4 g/dL — AB (ref 12.0–15.0)
Lymphocytes Relative: 2.1 % — ABNORMAL LOW (ref 12.0–46.0)
Lymphs Abs: 0.3 10*3/uL — ABNORMAL LOW (ref 0.7–4.0)
MCHC: 31.4 g/dL (ref 30.0–36.0)
MCV: 82.9 fl (ref 78.0–100.0)
Monocytes Absolute: 0.4 10*3/uL (ref 0.1–1.0)
Monocytes Relative: 2.6 % — ABNORMAL LOW (ref 3.0–12.0)
Neutro Abs: 12.9 10*3/uL — ABNORMAL HIGH (ref 1.4–7.7)
Neutrophils Relative %: 94.8 % — ABNORMAL HIGH (ref 43.0–77.0)
Platelets: 96 10*3/uL — ABNORMAL LOW (ref 150.0–400.0)
RBC: 4.39 Mil/uL (ref 3.87–5.11)
RDW: 18.5 % — ABNORMAL HIGH (ref 11.5–15.5)
WBC: 13.6 10*3/uL — ABNORMAL HIGH (ref 4.0–10.5)

## 2018-06-22 LAB — BASIC METABOLIC PANEL
BUN: 50 mg/dL — ABNORMAL HIGH (ref 6–23)
CO2: 41 mEq/L — ABNORMAL HIGH (ref 19–32)
Calcium: 8.4 mg/dL (ref 8.4–10.5)
Chloride: 92 mEq/L — ABNORMAL LOW (ref 96–112)
Creatinine, Ser: 1.15 mg/dL (ref 0.40–1.20)
GFR: 47.37 mL/min — ABNORMAL LOW (ref >60.00)
Glucose, Bld: 286 mg/dL — ABNORMAL HIGH (ref 70–99)
Potassium: 4.6 mEq/L (ref 3.5–5.1)
Sodium: 138 mEq/L (ref 135–145)

## 2018-06-22 NOTE — Telephone Encounter (Signed)
Ok to give verbal orders?

## 2018-06-22 NOTE — Telephone Encounter (Signed)
Called [facility] with verbal orders.

## 2018-06-24 DIAGNOSIS — E1143 Type 2 diabetes mellitus with diabetic autonomic (poly)neuropathy: Secondary | ICD-10-CM | POA: Diagnosis not present

## 2018-06-24 DIAGNOSIS — I2781 Cor pulmonale (chronic): Secondary | ICD-10-CM | POA: Diagnosis not present

## 2018-06-24 DIAGNOSIS — E039 Hypothyroidism, unspecified: Secondary | ICD-10-CM | POA: Diagnosis not present

## 2018-06-24 DIAGNOSIS — E1122 Type 2 diabetes mellitus with diabetic chronic kidney disease: Secondary | ICD-10-CM | POA: Diagnosis not present

## 2018-06-24 DIAGNOSIS — K3184 Gastroparesis: Secondary | ICD-10-CM | POA: Diagnosis not present

## 2018-06-24 DIAGNOSIS — E1140 Type 2 diabetes mellitus with diabetic neuropathy, unspecified: Secondary | ICD-10-CM | POA: Diagnosis not present

## 2018-06-24 DIAGNOSIS — G894 Chronic pain syndrome: Secondary | ICD-10-CM | POA: Diagnosis not present

## 2018-06-24 DIAGNOSIS — G4733 Obstructive sleep apnea (adult) (pediatric): Secondary | ICD-10-CM | POA: Diagnosis not present

## 2018-06-24 DIAGNOSIS — M1991 Primary osteoarthritis, unspecified site: Secondary | ICD-10-CM | POA: Diagnosis not present

## 2018-06-24 DIAGNOSIS — M797 Fibromyalgia: Secondary | ICD-10-CM | POA: Diagnosis not present

## 2018-06-24 DIAGNOSIS — J9621 Acute and chronic respiratory failure with hypoxia: Secondary | ICD-10-CM | POA: Diagnosis not present

## 2018-06-24 DIAGNOSIS — J9622 Acute and chronic respiratory failure with hypercapnia: Secondary | ICD-10-CM | POA: Diagnosis not present

## 2018-06-24 DIAGNOSIS — E1151 Type 2 diabetes mellitus with diabetic peripheral angiopathy without gangrene: Secondary | ICD-10-CM | POA: Diagnosis not present

## 2018-06-24 DIAGNOSIS — I872 Venous insufficiency (chronic) (peripheral): Secondary | ICD-10-CM | POA: Diagnosis not present

## 2018-06-24 DIAGNOSIS — J439 Emphysema, unspecified: Secondary | ICD-10-CM | POA: Diagnosis not present

## 2018-06-24 DIAGNOSIS — I471 Supraventricular tachycardia: Secondary | ICD-10-CM | POA: Diagnosis not present

## 2018-06-24 DIAGNOSIS — N183 Chronic kidney disease, stage 3 (moderate): Secondary | ICD-10-CM | POA: Diagnosis not present

## 2018-06-24 DIAGNOSIS — I5033 Acute on chronic diastolic (congestive) heart failure: Secondary | ICD-10-CM | POA: Diagnosis not present

## 2018-06-24 DIAGNOSIS — G2581 Restless legs syndrome: Secondary | ICD-10-CM | POA: Diagnosis not present

## 2018-06-24 DIAGNOSIS — Z9981 Dependence on supplemental oxygen: Secondary | ICD-10-CM | POA: Diagnosis not present

## 2018-06-24 DIAGNOSIS — I130 Hypertensive heart and chronic kidney disease with heart failure and stage 1 through stage 4 chronic kidney disease, or unspecified chronic kidney disease: Secondary | ICD-10-CM | POA: Diagnosis not present

## 2018-06-26 ENCOUNTER — Telehealth: Payer: Self-pay | Admitting: Physician Assistant

## 2018-06-26 NOTE — Telephone Encounter (Signed)
Paperwork reviewed by PCP. Forms in [place] folder for signature

## 2018-06-26 NOTE — Telephone Encounter (Signed)
I have placed HH cert. And plan of care and orders from [facility]. I have placed them in [facility]'s bin up front with a charge sheet.

## 2018-06-27 ENCOUNTER — Inpatient Hospital Stay: Payer: Medicare Other | Admitting: Internal Medicine

## 2018-06-27 DIAGNOSIS — F329 Major depressive disorder, single episode, unspecified: Secondary | ICD-10-CM

## 2018-06-27 DIAGNOSIS — Z7952 Long term (current) use of systemic steroids: Secondary | ICD-10-CM

## 2018-06-27 DIAGNOSIS — J9622 Acute and chronic respiratory failure with hypercapnia: Secondary | ICD-10-CM | POA: Diagnosis not present

## 2018-06-27 DIAGNOSIS — E1151 Type 2 diabetes mellitus with diabetic peripheral angiopathy without gangrene: Secondary | ICD-10-CM

## 2018-06-27 DIAGNOSIS — E1140 Type 2 diabetes mellitus with diabetic neuropathy, unspecified: Secondary | ICD-10-CM

## 2018-06-27 DIAGNOSIS — G894 Chronic pain syndrome: Secondary | ICD-10-CM

## 2018-06-27 DIAGNOSIS — Z905 Acquired absence of kidney: Secondary | ICD-10-CM

## 2018-06-27 DIAGNOSIS — I471 Supraventricular tachycardia: Secondary | ICD-10-CM

## 2018-06-27 DIAGNOSIS — I130 Hypertensive heart and chronic kidney disease with heart failure and stage 1 through stage 4 chronic kidney disease, or unspecified chronic kidney disease: Secondary | ICD-10-CM | POA: Diagnosis not present

## 2018-06-27 DIAGNOSIS — M1991 Primary osteoarthritis, unspecified site: Secondary | ICD-10-CM

## 2018-06-27 DIAGNOSIS — G2581 Restless legs syndrome: Secondary | ICD-10-CM

## 2018-06-27 DIAGNOSIS — E1122 Type 2 diabetes mellitus with diabetic chronic kidney disease: Secondary | ICD-10-CM

## 2018-06-27 DIAGNOSIS — I5033 Acute on chronic diastolic (congestive) heart failure: Secondary | ICD-10-CM | POA: Diagnosis not present

## 2018-06-27 DIAGNOSIS — Z9981 Dependence on supplemental oxygen: Secondary | ICD-10-CM

## 2018-06-27 DIAGNOSIS — J439 Emphysema, unspecified: Secondary | ICD-10-CM | POA: Diagnosis not present

## 2018-06-27 DIAGNOSIS — J9621 Acute and chronic respiratory failure with hypoxia: Secondary | ICD-10-CM | POA: Diagnosis not present

## 2018-06-27 DIAGNOSIS — Z85528 Personal history of other malignant neoplasm of kidney: Secondary | ICD-10-CM

## 2018-06-27 DIAGNOSIS — I872 Venous insufficiency (chronic) (peripheral): Secondary | ICD-10-CM

## 2018-06-27 DIAGNOSIS — Z9181 History of falling: Secondary | ICD-10-CM

## 2018-06-27 DIAGNOSIS — E1143 Type 2 diabetes mellitus with diabetic autonomic (poly)neuropathy: Secondary | ICD-10-CM

## 2018-06-27 DIAGNOSIS — E039 Hypothyroidism, unspecified: Secondary | ICD-10-CM

## 2018-06-27 DIAGNOSIS — Z87891 Personal history of nicotine dependence: Secondary | ICD-10-CM

## 2018-06-27 DIAGNOSIS — I2781 Cor pulmonale (chronic): Secondary | ICD-10-CM

## 2018-06-27 DIAGNOSIS — Z794 Long term (current) use of insulin: Secondary | ICD-10-CM

## 2018-06-27 DIAGNOSIS — K3184 Gastroparesis: Secondary | ICD-10-CM

## 2018-06-27 DIAGNOSIS — M797 Fibromyalgia: Secondary | ICD-10-CM

## 2018-06-27 DIAGNOSIS — Z79891 Long term (current) use of opiate analgesic: Secondary | ICD-10-CM

## 2018-06-27 DIAGNOSIS — Z6841 Body Mass Index (BMI) 40.0 and over, adult: Secondary | ICD-10-CM

## 2018-06-27 DIAGNOSIS — G4733 Obstructive sleep apnea (adult) (pediatric): Secondary | ICD-10-CM

## 2018-06-27 DIAGNOSIS — N183 Chronic kidney disease, stage 3 (moderate): Secondary | ICD-10-CM

## 2018-06-27 NOTE — Telephone Encounter (Signed)
Placed in bin for collection of charge sheet and fax.

## 2018-06-27 NOTE — Telephone Encounter (Signed)
Picked  up and faxed  

## 2018-06-27 NOTE — Telephone Encounter (Signed)
Forms signed and given to PCP

## 2018-06-28 DIAGNOSIS — M797 Fibromyalgia: Secondary | ICD-10-CM | POA: Diagnosis not present

## 2018-06-28 DIAGNOSIS — G2581 Restless legs syndrome: Secondary | ICD-10-CM | POA: Diagnosis not present

## 2018-06-28 DIAGNOSIS — M542 Cervicalgia: Secondary | ICD-10-CM | POA: Diagnosis not present

## 2018-06-28 DIAGNOSIS — N183 Chronic kidney disease, stage 3 (moderate): Secondary | ICD-10-CM | POA: Diagnosis not present

## 2018-06-28 DIAGNOSIS — I2781 Cor pulmonale (chronic): Secondary | ICD-10-CM | POA: Diagnosis not present

## 2018-06-28 DIAGNOSIS — J439 Emphysema, unspecified: Secondary | ICD-10-CM | POA: Diagnosis not present

## 2018-06-28 DIAGNOSIS — E1140 Type 2 diabetes mellitus with diabetic neuropathy, unspecified: Secondary | ICD-10-CM | POA: Diagnosis not present

## 2018-06-28 DIAGNOSIS — J9621 Acute and chronic respiratory failure with hypoxia: Secondary | ICD-10-CM | POA: Diagnosis not present

## 2018-06-28 DIAGNOSIS — M47817 Spondylosis without myelopathy or radiculopathy, lumbosacral region: Secondary | ICD-10-CM | POA: Diagnosis not present

## 2018-06-28 DIAGNOSIS — E1151 Type 2 diabetes mellitus with diabetic peripheral angiopathy without gangrene: Secondary | ICD-10-CM | POA: Diagnosis not present

## 2018-06-28 DIAGNOSIS — I471 Supraventricular tachycardia: Secondary | ICD-10-CM | POA: Diagnosis not present

## 2018-06-28 DIAGNOSIS — J9622 Acute and chronic respiratory failure with hypercapnia: Secondary | ICD-10-CM | POA: Diagnosis not present

## 2018-06-28 DIAGNOSIS — G894 Chronic pain syndrome: Secondary | ICD-10-CM | POA: Diagnosis not present

## 2018-06-28 DIAGNOSIS — E1143 Type 2 diabetes mellitus with diabetic autonomic (poly)neuropathy: Secondary | ICD-10-CM | POA: Diagnosis not present

## 2018-06-28 DIAGNOSIS — K3184 Gastroparesis: Secondary | ICD-10-CM | POA: Diagnosis not present

## 2018-06-28 DIAGNOSIS — M5136 Other intervertebral disc degeneration, lumbar region: Secondary | ICD-10-CM | POA: Diagnosis not present

## 2018-06-28 DIAGNOSIS — I5033 Acute on chronic diastolic (congestive) heart failure: Secondary | ICD-10-CM | POA: Diagnosis not present

## 2018-06-28 DIAGNOSIS — I130 Hypertensive heart and chronic kidney disease with heart failure and stage 1 through stage 4 chronic kidney disease, or unspecified chronic kidney disease: Secondary | ICD-10-CM | POA: Diagnosis not present

## 2018-06-28 DIAGNOSIS — M1991 Primary osteoarthritis, unspecified site: Secondary | ICD-10-CM | POA: Diagnosis not present

## 2018-06-28 DIAGNOSIS — G4733 Obstructive sleep apnea (adult) (pediatric): Secondary | ICD-10-CM | POA: Diagnosis not present

## 2018-06-28 DIAGNOSIS — I872 Venous insufficiency (chronic) (peripheral): Secondary | ICD-10-CM | POA: Diagnosis not present

## 2018-06-28 DIAGNOSIS — E1122 Type 2 diabetes mellitus with diabetic chronic kidney disease: Secondary | ICD-10-CM | POA: Diagnosis not present

## 2018-06-28 DIAGNOSIS — E039 Hypothyroidism, unspecified: Secondary | ICD-10-CM | POA: Diagnosis not present

## 2018-06-28 DIAGNOSIS — Z9981 Dependence on supplemental oxygen: Secondary | ICD-10-CM | POA: Diagnosis not present

## 2018-06-29 ENCOUNTER — Encounter: Payer: Self-pay | Admitting: Internal Medicine

## 2018-06-29 ENCOUNTER — Ambulatory Visit (INDEPENDENT_AMBULATORY_CARE_PROVIDER_SITE_OTHER): Payer: Medicare Other | Admitting: Internal Medicine

## 2018-06-29 VITALS — BP 138/70 | HR 93 | Ht 64.0 in | Wt 268.0 lb

## 2018-06-29 DIAGNOSIS — J9612 Chronic respiratory failure with hypercapnia: Secondary | ICD-10-CM | POA: Diagnosis not present

## 2018-06-29 DIAGNOSIS — J449 Chronic obstructive pulmonary disease, unspecified: Secondary | ICD-10-CM

## 2018-06-29 DIAGNOSIS — J9611 Chronic respiratory failure with hypoxia: Secondary | ICD-10-CM | POA: Diagnosis not present

## 2018-06-29 MED ORDER — BUDESONIDE-FORMOTEROL FUMARATE 160-4.5 MCG/ACT IN AERO: 2.0000 | INHALATION_SPRAY | Freq: Two times a day (BID) | RESPIRATORY_TRACT | 0 refills | Status: DC

## 2018-06-29 MED ORDER — BUDESONIDE-FORMOTEROL FUMARATE 160-4.5 MCG/ACT IN AERO: 2.0000 | INHALATION_SPRAY | Freq: Two times a day (BID) | RESPIRATORY_TRACT | 11 refills | Status: AC

## 2018-06-29 NOTE — Patient Instructions (Addendum)
Plan A = Automatic = Try symbicort 160 Take 2 puffs first thing in am and then another 2 puffs about 12 hours later.   Work on inhaler technique:  relax and gently blow all the way out then take a nice smooth deep breath back in, triggering the inhaler at same time you start breathing in.  Hold for up to 5 seconds if you can. Blow out thru nose. Rinse and gargle with water when done     Plan B = Backup Only use your albuterol as a rescue medication to be used if you can't catch your breath by resting or doing a relaxed purse lip breathing pattern.  - The less you use it, the better it will work when you need it. - Ok to use the inhaler up to 2 puffs  every 4 hours if you must but call for appointment if use goes up over your usual need - Don't leave home without it !!  (think of it like the spare tire for your car)   Plan C = Crisis - only use your albuterol nebulizer if you first try Plan B and it fails to help > ok to use the nebulizer up to every 4 hours but if start needing it regularly call for immediate appointment   Keep up the  work on the weight loss - if needed increase 02 to keep above sats 90% with exercise - pace yourself slower if you can't keep the 02 sats at 90% at the highest setting   Discuss with Dr Elsworth Soho taking over on your copd treatment         .

## 2018-06-29 NOTE — Assessment & Plan Note (Signed)
Body mass index is 46 kg/m.  -  trending up again  Lab Results  Component Value Date   TSH 2.39 09/28/2016     Contributing to gerd risk/ doe/reviewed the need and the process to achieve and maintain neg calorie balance > defer f/u primary care including intermittently monitoring thyroid status      I had an extended discussion with the patient reviewing all relevant studies completed to date and  lasting 15 to 20 minutes of a 25 minute visit    See device teaching which extended face to face time for this visit.  Each maintenance medication was reviewed in detail including emphasizing most importantly the difference between maintenance and prns and under what circumstances the prns are to be triggered using an action plan format that is not reflected in the computer generated alphabetically organized AVS which I have not found useful in most complex patients, especially with respiratory illnesses  Please see AVS for specific instructions unique to this visit that I personally wrote and verbalized to the the pt in detail and then reviewed with pt  by my nurse highlighting any  changes in therapy recommended at today's visit to their plan of care.     Pulm f/u per Elsworth Soho for stable copd and cpap - I can see her prn flares or poorly controlled chronic symptoms

## 2018-06-29 NOTE — Assessment & Plan Note (Signed)
HC03   1/919  = 39  - ono on 2lpm ? On cpap or not done 06/28/17> desat  < 89% x 81.77m so needs cpap titration if no recent study   - Sleep titration done 08/12/17 > needs 3lpm blended in per Dr Bari Mantis interpretation > ordered  08/16/2017  - HC03  04/18/18  =  42  - HC03  06/21/18    =  41   06/29/2018  sats on RA 88% so needs 2.5 lpm on cpap and 3 lpm continuous daytime and up to 4 lpm with activity

## 2018-06-29 NOTE — Progress Notes (Signed)
Subjective:    Patient ID: Renee Noble, female    DOB: Mar 10, 1956,    MRN: [id number]    Brief patient profile:  63 yowf quit smoking 07/2016   referred by Renee Noble to pulmonary clinic for copd evaluation in 12/2013  With GOLD III criteria and referred back  04/28/2017       History of Present Illness  04/28/2017  New pt ov/Renee Noble re:re- establish for eval of sob/ ? Clear for surgery / maint rx = spiriva dpi Chief Complaint  Patient presents with  . Pulmonary Consult    Referred by Renee Noble, PA for eval of pulmonary hypertension. Pt states she also needs clearance for hernia repair surgery.  She c/o occ non prod cough. She states her breathing is doing well today. She rarely uses her rescue inhaler.    02 sat at rest RA = low 90s Also wearing 02 2.5 lpm hs Walking x 143ft on 02 = 2.5lpm may be 100 ft slow pace = [facility] = can't walk 100 yards even at a slow pace at a flat grade s stopping due to sob  / may be uses saba hfa once a month, almost never noct unless flaring with uri  Sleeping one- 2 pillows on cpap 2.5 lpm  Losing wt voluntarily hoping to have abd wall hernia surgery 2019  rec Start stiolto 2 pffs each am  Please see patient coordinator before you leave today  to schedule 2d echo and overnight 02 sat on cpap and your 02 2.5lpm  Please schedule a follow up office visit in 6 weeks, call sooner if needed  - add need to clarify who is following cpap  > PA doctor 2 y prior to OV  / 16 /2lpm    06/10/2017  f/u ov/Renee Noble re: back to spiriva /advair  2lpm 24/7 x does 3lp pulsed portable / better sobon stiolto but not covered  Chief Complaint  Patient presents with  . Follow-up    feels like her breathing,lungs are better,denies SOB,coughing,use O2 2.5L continuous O2  has not needed saba but doe still  = MMRC3 = can't walk 100 yards even at a slow pace at a flat grade s stopping due to sob   Sleeping ok on 2.5 lpm  rec Plan A = Automatic = Bevespi Take 2 puffs first thing  in am and then another 2 puffs about 12 hours later and if insurance doesn't cover or you don't like it go back to advair/spriva Work on inhaler technique:  Plan B = Backup Only use your albuterol as a rescue medication  Plan C = Crisis - only use your albuterol nebulizer if you first try Plan B and it fails to help > ok to use the nebulizer up to every 4 hours but if start needing it regularly call for immediate appointment Please schedule a follow up visit in 3 months but call sooner if needed to See [name] NP on return and bring your drug forumulary and all your inhalers and neb solutions        11/23/2017  f/u ov/Renee Noble re: COPD III  Chief Complaint  Patient presents with  . Follow-up    Pt states she has been doing okay since last visit. States she has been having some digestive problems that has been bothering her x2 weeks.  Dyspnea:  MMRC2 = can't walk a nl pace on a flat grade s sob but does fine slow and flat food lion 2lpm sats staying above  90% Cough: min dry aggravated by hot weather / ac exposure also if too cool  SABA use: twice weekly never neb 02: sometimes take off 02 at rest sats are low 90s Plan A = Automatic = Try symbicort 160 Take 2 puffs first thing in am and then another 2 puffs about 12 hours later.  Work on inhaler technique:  relax and gently blow all the way out then take a nice smooth deep breath back in, triggering the inhaler at same time you start breathing in.  Hold for up to 5 seconds if you can. Blow out thru nose. Rinse and gargle with water when done Plan B = Backup Only use your albuterol as a rescue medication Plan C = Crisis - only use your albuterol nebulizer if you first try Plan B and it fails to help > ok to use the nebulizer up to every 4 hours but if start needing it regularly call for immediate appointment Keep up the good work on the weight loss - if needed increase 02 to keep above sats 90% with exercise - add: be sure she stops bevespi while  on symbicort but if prefers bevespi go back to it  - also I did not address her gi issues at ov but needs to contact her PCP or be referred to our GI dept - can try maalox plus prn in meantime      03/21/18 NP eval rec Continue on Symbicort 2 puffs Twice daily  , rinse after use.  Increase Duoneb Four times a day  .  Restart CPAP At bedtime  And with naps.  Continue on oxygen 4l/m . Goal is to keep O2 sats 88-92%.  Keep daily weights.  Lasix 20mg  daily for 2 days .  Low salt diet  Begin Prednisone 20mg  daily for 5 days , 10mg  daily for 5 days then stop .  Mucinex DM Twice daily  As needed  Cough/congestion .  CT scan in 6 weeks and As needed    Admit date: 04/07/2018 Discharge date: 04/11/2018   Discharge Diagnoses:  Acute on chronic respiratory failure with hypoxia and hypercarbia -Secondary to CHF exacerbation and COPD exacerbation -Presently stable on 5>>>3L -Patient is normally on 3 L nasal cannula at home -Pulmonary hygiene -Personally reviewed chest x-ray--vascular congestion/chronic interstitial changes;no consolidation  Acute on chronic diastolic CHF -47/42/5956 discharge weight 258.6 pounds-  -Admission weight 299 pounds -Continue IV furosemide>>>d/c home with torsemide 20 mg daily -renal function remained stable after transition to torsemide while in hospital -Echocardiogram--60-65%, no WMA, mild AS - sinus nonspecific T wave changes  COPD exacerbation -ContinuePulmicort -Conitnueduo nebs -ContinueIV Solu-Medrol>>d/c home with prednisone taper -Patient has over 80-pack-year history  SVT/MAT -HR intermittently into 120s -start diltiazem  CKD stage III -Baseline creatinine 1.4-1.5 -Monitor with diuresis -serum creatinine 1.59 on day of d/c    Diabetes mellitus type 2 with gastroparesis -Hemoglobin A1c--5.2 -increase lantus to 30 units -novolog 3 units with meals -NovoLog sliding scale -Anticipate elevated CBG secondary to  steroids -Continue metoclopramide  Chronic pain syndrome -Continue home dose MS Contin and oxycodone -The [place] Controlled Substance Reporting System has been queried for this patient--no red flags -Continue nortriptyline  Hyperlipidemia -Continue statin  Hypothyroidism -Continue Synthroid  Thrombocytopenia -This appears to be chronic upon review of medical records -Monitor for signs of bleeding -Serum L87--564 -Folic PPIR--5.1-->[id number]  Leg pain and edema -Venous duplex--neg  Morbid Obesity -BMI 50.67 -lifestyle modification         05/04/2018  Post hosp f/u ov/Renee Noble re:  GOLD III spirometry/ hypercabia/02 dep 24/7 maint on symb 160 2bid / duone tid  Chief Complaint  Patient presents with  . Hospitalization Follow-up    Feels like she's back to her pre-hosp status post d/c Dec3 with CHF admission  Dyspnea: MMRC3 = can't walk 100 yards even at a slow pace at a flat grade s stopping due to sob   Cough: slt increase x 48 h/ mucinex helping Sleeping: 2 pillows / cpap/ 2.5 lpm  SABA use: neb tid and no hfa 02: 3lpm continuous at home/ pulsed when out    rec Work on inhaler technique:   You can use you your duoneb up to 4 x daily  Only use your albuterol as a rescue medication    Admit date: 06/08/2018 Discharge date: 06/15/2018  Admitted From: Home.  Disposition:  Home.   Recommendations for Outpatient Follow-up:  1. Follow up with PCP in 1-2 weeks 2. Please obtain BMP/CBC in one week Please follow up with pulmonology in 1 to 2 weeks.   Discharge Condition:stable.  CODE STATUS: full code.  Diet recommendation: Heart Healthy Brief/Interim Summary:   Renee Noble a 63 y.o.femalewith medical history significant ofmorbid obesity,solitary kidney, CKD stage III, hypertension, hyperlipidemia, diabetes mellitus, COPD on 3 L oxygen at home,chronic diastolicCHF, HCV, OSA on CPAP, fibromyalgia, chronic back pain and neck pain,  depression, hypothyroidism, who presents with shortness of breath. Admitted for acute on chronic hypoxic respiratory failure secondary toacute COPD exacerbation.  Hospital course complicated by persistent hypoxia requiring higher level of oxygen above her baseline of 3 L continuously despite aggressive treatment.Patient follows with Dr. Keene Breath pulmonologist.   Discharge Diagnoses:  Principal Problem:   Acute on chronic respiratory failure with hypoxia ([place]) Active Problems:   Type II diabetes mellitus with renal manifestations ([place])   Essential hypertension   CKD (chronic kidney disease), stage III ([facility])   OSA (obstructive sleep apnea)   Acute on chronic diastolic CHF (congestive heart failure) ([facility])   HLD (hyperlipidemia)   Hypothyroidism   Depression   Fibromyalgia   COPD exacerbation ([facility])  Acute on chronic respiratory failure with hypoxia probably secondary to acute COPD exacerbation She is back to 3 L of nasal cannula oxygen but on ambulation she is requiring up to 4 L of nasal cannula oxygen. Keep sats greater than 90%. Evaluate home oxygen requirement prior to discharge. As her wheezing has improved we will transition to prednisone taper on discharge.Continue with duo nebs and flutter valve. Blood cultures, sputum cultures and flu PCR is negative. Continue with incentive spirometry and Mucinex. Patient appears to have completed Z-Pak.  Asymptomatic nonsustained V. tach Resolved.   Acute kidney injury on stage III CKD in the setting of solitary kidney Diuresed about liters since admission. Creatinine stable around 1.2 which appears to be her baseline   Hypertension Well-controlled continue with Cardizem and Cozaar   Type 2 diabetes mellitus with hyperglycemia Increased lantus on discharge to her home dose.     Obstructive sleep apnea Patient has not been able to tolerate CPAP in the hospital.     06/29/2018  f/u ov/Renee Noble re:  GOLD III  / Chief Complaint  Patient presents with  . Hospitalization Follow-up    Pt admitted from 06/08/2018 - 06/15/2018 with acute respiratory failure and CHF. She states her breathing has improved to baseline for her. She is using her proair 2 x per wk and neb 2 x per day.   Dyspnea:  Now able to do foodlion on 4lpm  Cough: none Sleeping: flat bed, lots of pillows and cpap and 2.5 lpm  SABA use: duoneb bid  02:  Hs 2.5 lpm with cpap,   3 continuous  Daytime, up to 4 pulsed with activity    No obvious day to day or daytime variability or assoc excess/ purulent sputum or mucus plugs or hemoptysis or cp or chest tightness, subjective wheeze or overt sinus or hb symptoms.   Sleeping on cpap as above  without nocturnal  or early am exacerbation  of respiratory  c/o's or need for noct saba. Also denies any obvious fluctuation of symptoms with weather or environmental changes or other aggravating or alleviating factors except as outlined above   No unusual exposure hx or h/o childhood pna/ asthma or knowledge of premature birth.  Current Allergies, Complete Past Medical History, Past Surgical History, Family History, and Social History were reviewed in [employer] record.  ROS  The following are not active complaints unless bolded Hoarseness, sore throat, dysphagia, dental problems, itching, sneezing,  nasal congestion or discharge of excess mucus or purulent secretions, ear ache,   fever, chills, sweats, unintended wt loss or wt gain, classically pleuritic or exertional cp,  orthopnea pnd or arm/hand swelling  or leg swelling, presyncope, palpitations, abdominal pain, anorexia, nausea, vomiting, diarrhea  or change in bowel habits or change in bladder habits, change in stools or change in urine, dysuria, hematuria,  rash, arthralgias, visual complaints, headache, numbness, weakness or ataxia or problems with walking or coordination,  change in mood or  memory.        Current Meds   Medication Sig  . albuterol (PROAIR HFA) 108 (90 Base) MCG/ACT inhaler Inhale 1-2 puffs into the lungs every 6 (six) hours as needed for wheezing or shortness of breath.  . benzonatate (TESSALON) 200 MG capsule Take 1 capsule (200 mg total) by mouth 3 (three) times daily as needed for cough.  . budesonide-formoterol (SYMBICORT) 160-4.5 MCG/ACT inhaler Inhale 2 puffs into the lungs 2 (two) times daily.  . clotrimazole-betamethasone (LOTRISONE) cream Apply 1 application topically 2 (two) times daily as needed. (Patient taking differently: Apply 1 application topically 2 (two) times daily as needed (yeast). )  . dextromethorphan-guaiFENesin (MUCINEX DM) 30-600 MG 12hr tablet Take 1 tablet by mouth 2 (two) times daily as needed for cough.  . diltiazem (CARDIZEM CD) 180 MG 24 hr capsule Take 1 capsule (180 mg total) by mouth daily.  [name] Kitchen HUMALOG 100 UNIT/ML injection INJECT 0.15 ML (15 UNITS) INTO THE SKIN 3 TIMES DAILY AS NEEDED FOR HIGH BLOOD SUGAR (Patient taking differently: Inject 15 Units into the skin 3 (three) times daily as needed for high blood sugar. Uses sliding scale. Sugar over 200 will give insulin.)  . insulin glargine (LANTUS) 100 UNIT/ML injection INJECT 0.3MLS (30 UNITS TOTAL) INTO THE SKIN EVERY DAY (Patient taking differently: Inject 30 Units into the skin every morning. INJECT 0.3MLS (30 UNITS TOTAL) INTO THE SKIN EVERY DAY)  . ipratropium-albuterol (DUONEB) 0.5-2.5 (3) MG/3ML SOLN Take 3 mLs by nebulization 4 (four) times daily. Dx: J44.9  . levothyroxine (SYNTHROID, LEVOTHROID) 88 MCG tablet Take 1 tablet (88 mcg total) by mouth daily before breakfast. (Patient taking differently: Take 88 mcg by mouth at bedtime. )  . metoCLOPramide (REGLAN) 10 MG tablet Take 1 tablet (10 mg total) by mouth every 6 (six) hours as needed for nausea. (Patient taking differently: Take 10 mg by mouth 2 (two) times  daily. )  . morphine (MS CONTIN) 30 MG 12 hr tablet Take 30 mg by mouth every 12 (twelve)  hours.  . Multiple Vitamins-Minerals (CENTRUM SILVER PO) Take by mouth.  . nortriptyline (PAMELOR) 25 MG capsule Take 1 capsule (25 mg total) by mouth at bedtime.  [name] Kitchen nystatin (MYCOSTATIN/NYSTOP) powder APPLY TOPICALLY TWICE DAILY AS NEEDED FOR YEAST INFECTION (Patient taking differently: Apply 1 g topically 2 (two) times daily as needed (yeast). )  . OXYGEN O2 3L at home.  . pravastatin (PRAVACHOL) 20 MG tablet TAKE ONE TABLET BY MOUTH EVERY DAY  . Probiotic Product (PROBIOTIC PO) Take by mouth.  Haig Prophet COMFORT INSULIN SYRINGE 30G X 1/2" 1 ML MISC USE FOUR (4) TIMES DAILY AS DIRECTED (Patient taking differently: Inject 1 Syringe into the skin 4 (four) times daily. )  . torsemide (DEMADEX) 20 MG tablet Take 1 tablet (20 mg total) by mouth daily.             Objective:   Physical Exam   amb obese wf nad     06/29/2018        268  05/04/2018      250  06/10/2017       281   04/28/17 276 lb (125.2 kg)  04/23/17 279 lb (126.6 kg)  04/21/17 279 lb (126.6 kg)     Vital signs reviewed - Note on arrival 02 sats  88% RA at rest  And 94% 3lpm cont     HEENT: Full dentures/ nl  oropharynx. Nl external ear canals without cough reflex -  Mild bilateral non-specific turbinate edema     NECK :  without JVD/Nodes/TM/ nl carotid upstrokes bilaterally   LUNGS: no acc muscle use,  Mod barrel  contour chest wall with bilateral  Distant bs s audible wheeze and  without cough on insp or exp maneuver and mod  Hyperresonant  to  percussion bilaterally     CV:  RRR  no s3 or murmur or increase in P2, and trace ankle edema bilaterally   ABD:  soft and nontender with pos mid  insp Hoover's  in the supine position. No bruits or organomegaly appreciated, bowel sounds nl  MS:   Nl gait/  ext warm without deformities, calf tenderness, cyanosis or clubbing No obvious joint restrictions   SKIN: warm and dry with classic elephantiasis changes both le's  NEURO:  alert, approp, nl sensorium with  no motor  or cerebellar deficits apparent.            I personally reviewed images and agree with radiology impression as follows:  CXR:   06/08/18 Enlarged cardiac silhouette. Pulmonary vascular congestion. No focal consolidation.       Assessment & Plan:

## 2018-06-29 NOTE — Assessment & Plan Note (Signed)
Quit smoking 07/2016  Trial off advair 12/25/13 as not using it consistently anyway> pt restarted around 02/03/14   - 02/14/2014 PFTs  FEV1  1.22 (48%) ratio 53 p 12% improvement from SABA and dlco 73% corrects to 84  - 02/15/2014  Walked RA  2 laps @ 185 ft each stopped due to  89% /   nl pace / legs gave out same time chest got "tight" relieved immediately at rest  - Spirometry 04/28/2017  FEV1 0.74 (30%)  Ratio 47 with classic curvature p spiriva dpi in am  - 04/28/2017    try stiolto 2 each am   improved but not on formulary  - 06/10/2017   try bevespi 2bid  - 11/23/2017  Wheezing on bevespi so changed to  symb 160 2bid - 06/29/2018  After extensive coaching inhaler device,  effectiveness =    90%    Well compensated copd/ab with ? Component cardiac asthma and prefers symbicort over bevespi but still using too much saba in multiple forms   I spent extra time with pt today reviewing appropriate use of albuterol for prn use on exertion with the following points: 1) saba is for relief of sob that does not improve by walking a slower pace or resting but rather if the pt does not improve after trying this first. 2) If the pt is convinced, as many are, that saba helps recover from activity faster then it's easy to tell if this is the case by re-challenging : ie stop, take the inhaler, then p 5 minutes try the exact same activity (intensity of workload) that just caused the symptoms and see if they are substantially diminished or not after saba 3) if there is an activity that reproducibly causes the symptoms, try the saba 15 min before the activity on alternate days   If in fact the saba really does help, then fine to continue to use it prn but advised may need to look closer at the maintenance regimen being used to achieve better control of airways disease with exertion.

## 2018-06-30 DIAGNOSIS — I471 Supraventricular tachycardia: Secondary | ICD-10-CM | POA: Diagnosis not present

## 2018-06-30 DIAGNOSIS — M1991 Primary osteoarthritis, unspecified site: Secondary | ICD-10-CM | POA: Diagnosis not present

## 2018-06-30 DIAGNOSIS — I872 Venous insufficiency (chronic) (peripheral): Secondary | ICD-10-CM | POA: Diagnosis not present

## 2018-06-30 DIAGNOSIS — J9622 Acute and chronic respiratory failure with hypercapnia: Secondary | ICD-10-CM | POA: Diagnosis not present

## 2018-06-30 DIAGNOSIS — N183 Chronic kidney disease, stage 3 (moderate): Secondary | ICD-10-CM | POA: Diagnosis not present

## 2018-06-30 DIAGNOSIS — I2781 Cor pulmonale (chronic): Secondary | ICD-10-CM | POA: Diagnosis not present

## 2018-06-30 DIAGNOSIS — G894 Chronic pain syndrome: Secondary | ICD-10-CM | POA: Diagnosis not present

## 2018-06-30 DIAGNOSIS — I5033 Acute on chronic diastolic (congestive) heart failure: Secondary | ICD-10-CM | POA: Diagnosis not present

## 2018-06-30 DIAGNOSIS — E039 Hypothyroidism, unspecified: Secondary | ICD-10-CM | POA: Diagnosis not present

## 2018-06-30 DIAGNOSIS — J439 Emphysema, unspecified: Secondary | ICD-10-CM | POA: Diagnosis not present

## 2018-06-30 DIAGNOSIS — G2581 Restless legs syndrome: Secondary | ICD-10-CM | POA: Diagnosis not present

## 2018-06-30 DIAGNOSIS — I130 Hypertensive heart and chronic kidney disease with heart failure and stage 1 through stage 4 chronic kidney disease, or unspecified chronic kidney disease: Secondary | ICD-10-CM | POA: Diagnosis not present

## 2018-06-30 DIAGNOSIS — Z9981 Dependence on supplemental oxygen: Secondary | ICD-10-CM | POA: Diagnosis not present

## 2018-06-30 DIAGNOSIS — K3184 Gastroparesis: Secondary | ICD-10-CM | POA: Diagnosis not present

## 2018-06-30 DIAGNOSIS — E1122 Type 2 diabetes mellitus with diabetic chronic kidney disease: Secondary | ICD-10-CM | POA: Diagnosis not present

## 2018-06-30 DIAGNOSIS — E1140 Type 2 diabetes mellitus with diabetic neuropathy, unspecified: Secondary | ICD-10-CM | POA: Diagnosis not present

## 2018-06-30 DIAGNOSIS — E1143 Type 2 diabetes mellitus with diabetic autonomic (poly)neuropathy: Secondary | ICD-10-CM | POA: Diagnosis not present

## 2018-06-30 DIAGNOSIS — E1151 Type 2 diabetes mellitus with diabetic peripheral angiopathy without gangrene: Secondary | ICD-10-CM | POA: Diagnosis not present

## 2018-06-30 DIAGNOSIS — J9621 Acute and chronic respiratory failure with hypoxia: Secondary | ICD-10-CM | POA: Diagnosis not present

## 2018-06-30 DIAGNOSIS — G4733 Obstructive sleep apnea (adult) (pediatric): Secondary | ICD-10-CM | POA: Diagnosis not present

## 2018-06-30 DIAGNOSIS — M797 Fibromyalgia: Secondary | ICD-10-CM | POA: Diagnosis not present

## 2018-07-03 DIAGNOSIS — M797 Fibromyalgia: Secondary | ICD-10-CM | POA: Diagnosis not present

## 2018-07-03 DIAGNOSIS — I2781 Cor pulmonale (chronic): Secondary | ICD-10-CM | POA: Diagnosis not present

## 2018-07-03 DIAGNOSIS — J9622 Acute and chronic respiratory failure with hypercapnia: Secondary | ICD-10-CM | POA: Diagnosis not present

## 2018-07-03 DIAGNOSIS — K3184 Gastroparesis: Secondary | ICD-10-CM | POA: Diagnosis not present

## 2018-07-03 DIAGNOSIS — G894 Chronic pain syndrome: Secondary | ICD-10-CM | POA: Diagnosis not present

## 2018-07-03 DIAGNOSIS — Z9981 Dependence on supplemental oxygen: Secondary | ICD-10-CM | POA: Diagnosis not present

## 2018-07-03 DIAGNOSIS — M1991 Primary osteoarthritis, unspecified site: Secondary | ICD-10-CM | POA: Diagnosis not present

## 2018-07-03 DIAGNOSIS — G2581 Restless legs syndrome: Secondary | ICD-10-CM | POA: Diagnosis not present

## 2018-07-03 DIAGNOSIS — E1143 Type 2 diabetes mellitus with diabetic autonomic (poly)neuropathy: Secondary | ICD-10-CM | POA: Diagnosis not present

## 2018-07-03 DIAGNOSIS — E1140 Type 2 diabetes mellitus with diabetic neuropathy, unspecified: Secondary | ICD-10-CM | POA: Diagnosis not present

## 2018-07-03 DIAGNOSIS — I471 Supraventricular tachycardia: Secondary | ICD-10-CM | POA: Diagnosis not present

## 2018-07-03 DIAGNOSIS — J449 Chronic obstructive pulmonary disease, unspecified: Secondary | ICD-10-CM | POA: Diagnosis not present

## 2018-07-03 DIAGNOSIS — N183 Chronic kidney disease, stage 3 (moderate): Secondary | ICD-10-CM | POA: Diagnosis not present

## 2018-07-03 DIAGNOSIS — E039 Hypothyroidism, unspecified: Secondary | ICD-10-CM | POA: Diagnosis not present

## 2018-07-03 DIAGNOSIS — J9621 Acute and chronic respiratory failure with hypoxia: Secondary | ICD-10-CM | POA: Diagnosis not present

## 2018-07-03 DIAGNOSIS — E1122 Type 2 diabetes mellitus with diabetic chronic kidney disease: Secondary | ICD-10-CM | POA: Diagnosis not present

## 2018-07-03 DIAGNOSIS — I872 Venous insufficiency (chronic) (peripheral): Secondary | ICD-10-CM | POA: Diagnosis not present

## 2018-07-03 DIAGNOSIS — J439 Emphysema, unspecified: Secondary | ICD-10-CM | POA: Diagnosis not present

## 2018-07-03 DIAGNOSIS — E1151 Type 2 diabetes mellitus with diabetic peripheral angiopathy without gangrene: Secondary | ICD-10-CM | POA: Diagnosis not present

## 2018-07-03 DIAGNOSIS — I5033 Acute on chronic diastolic (congestive) heart failure: Secondary | ICD-10-CM | POA: Diagnosis not present

## 2018-07-03 DIAGNOSIS — G4733 Obstructive sleep apnea (adult) (pediatric): Secondary | ICD-10-CM | POA: Diagnosis not present

## 2018-07-03 DIAGNOSIS — I130 Hypertensive heart and chronic kidney disease with heart failure and stage 1 through stage 4 chronic kidney disease, or unspecified chronic kidney disease: Secondary | ICD-10-CM | POA: Diagnosis not present

## 2018-07-05 DIAGNOSIS — I5033 Acute on chronic diastolic (congestive) heart failure: Secondary | ICD-10-CM | POA: Diagnosis not present

## 2018-07-05 DIAGNOSIS — G894 Chronic pain syndrome: Secondary | ICD-10-CM | POA: Diagnosis not present

## 2018-07-05 DIAGNOSIS — I130 Hypertensive heart and chronic kidney disease with heart failure and stage 1 through stage 4 chronic kidney disease, or unspecified chronic kidney disease: Secondary | ICD-10-CM | POA: Diagnosis not present

## 2018-07-05 DIAGNOSIS — G2581 Restless legs syndrome: Secondary | ICD-10-CM | POA: Diagnosis not present

## 2018-07-05 DIAGNOSIS — E1122 Type 2 diabetes mellitus with diabetic chronic kidney disease: Secondary | ICD-10-CM | POA: Diagnosis not present

## 2018-07-05 DIAGNOSIS — M1991 Primary osteoarthritis, unspecified site: Secondary | ICD-10-CM | POA: Diagnosis not present

## 2018-07-05 DIAGNOSIS — J439 Emphysema, unspecified: Secondary | ICD-10-CM | POA: Diagnosis not present

## 2018-07-05 DIAGNOSIS — J9621 Acute and chronic respiratory failure with hypoxia: Secondary | ICD-10-CM | POA: Diagnosis not present

## 2018-07-05 DIAGNOSIS — Z9981 Dependence on supplemental oxygen: Secondary | ICD-10-CM | POA: Diagnosis not present

## 2018-07-05 DIAGNOSIS — I872 Venous insufficiency (chronic) (peripheral): Secondary | ICD-10-CM | POA: Diagnosis not present

## 2018-07-05 DIAGNOSIS — E039 Hypothyroidism, unspecified: Secondary | ICD-10-CM | POA: Diagnosis not present

## 2018-07-05 DIAGNOSIS — E1140 Type 2 diabetes mellitus with diabetic neuropathy, unspecified: Secondary | ICD-10-CM | POA: Diagnosis not present

## 2018-07-05 DIAGNOSIS — I2781 Cor pulmonale (chronic): Secondary | ICD-10-CM | POA: Diagnosis not present

## 2018-07-05 DIAGNOSIS — I471 Supraventricular tachycardia: Secondary | ICD-10-CM | POA: Diagnosis not present

## 2018-07-05 DIAGNOSIS — M797 Fibromyalgia: Secondary | ICD-10-CM | POA: Diagnosis not present

## 2018-07-05 DIAGNOSIS — E1143 Type 2 diabetes mellitus with diabetic autonomic (poly)neuropathy: Secondary | ICD-10-CM | POA: Diagnosis not present

## 2018-07-05 DIAGNOSIS — K3184 Gastroparesis: Secondary | ICD-10-CM | POA: Diagnosis not present

## 2018-07-05 DIAGNOSIS — G4733 Obstructive sleep apnea (adult) (pediatric): Secondary | ICD-10-CM | POA: Diagnosis not present

## 2018-07-05 DIAGNOSIS — E1151 Type 2 diabetes mellitus with diabetic peripheral angiopathy without gangrene: Secondary | ICD-10-CM | POA: Diagnosis not present

## 2018-07-05 DIAGNOSIS — N183 Chronic kidney disease, stage 3 (moderate): Secondary | ICD-10-CM | POA: Diagnosis not present

## 2018-07-05 DIAGNOSIS — J9622 Acute and chronic respiratory failure with hypercapnia: Secondary | ICD-10-CM | POA: Diagnosis not present

## 2018-07-07 DIAGNOSIS — E1122 Type 2 diabetes mellitus with diabetic chronic kidney disease: Secondary | ICD-10-CM | POA: Diagnosis not present

## 2018-07-07 DIAGNOSIS — M797 Fibromyalgia: Secondary | ICD-10-CM | POA: Diagnosis not present

## 2018-07-07 DIAGNOSIS — E039 Hypothyroidism, unspecified: Secondary | ICD-10-CM | POA: Diagnosis not present

## 2018-07-07 DIAGNOSIS — I872 Venous insufficiency (chronic) (peripheral): Secondary | ICD-10-CM | POA: Diagnosis not present

## 2018-07-07 DIAGNOSIS — M1991 Primary osteoarthritis, unspecified site: Secondary | ICD-10-CM | POA: Diagnosis not present

## 2018-07-07 DIAGNOSIS — G894 Chronic pain syndrome: Secondary | ICD-10-CM | POA: Diagnosis not present

## 2018-07-07 DIAGNOSIS — E1151 Type 2 diabetes mellitus with diabetic peripheral angiopathy without gangrene: Secondary | ICD-10-CM | POA: Diagnosis not present

## 2018-07-07 DIAGNOSIS — J9622 Acute and chronic respiratory failure with hypercapnia: Secondary | ICD-10-CM | POA: Diagnosis not present

## 2018-07-07 DIAGNOSIS — J439 Emphysema, unspecified: Secondary | ICD-10-CM | POA: Diagnosis not present

## 2018-07-07 DIAGNOSIS — G2581 Restless legs syndrome: Secondary | ICD-10-CM | POA: Diagnosis not present

## 2018-07-07 DIAGNOSIS — I2781 Cor pulmonale (chronic): Secondary | ICD-10-CM | POA: Diagnosis not present

## 2018-07-07 DIAGNOSIS — I5033 Acute on chronic diastolic (congestive) heart failure: Secondary | ICD-10-CM | POA: Diagnosis not present

## 2018-07-07 DIAGNOSIS — I471 Supraventricular tachycardia: Secondary | ICD-10-CM | POA: Diagnosis not present

## 2018-07-07 DIAGNOSIS — J9621 Acute and chronic respiratory failure with hypoxia: Secondary | ICD-10-CM | POA: Diagnosis not present

## 2018-07-07 DIAGNOSIS — E1143 Type 2 diabetes mellitus with diabetic autonomic (poly)neuropathy: Secondary | ICD-10-CM | POA: Diagnosis not present

## 2018-07-07 DIAGNOSIS — I130 Hypertensive heart and chronic kidney disease with heart failure and stage 1 through stage 4 chronic kidney disease, or unspecified chronic kidney disease: Secondary | ICD-10-CM | POA: Diagnosis not present

## 2018-07-07 DIAGNOSIS — G4733 Obstructive sleep apnea (adult) (pediatric): Secondary | ICD-10-CM | POA: Diagnosis not present

## 2018-07-07 DIAGNOSIS — N183 Chronic kidney disease, stage 3 (moderate): Secondary | ICD-10-CM | POA: Diagnosis not present

## 2018-07-07 DIAGNOSIS — Z9981 Dependence on supplemental oxygen: Secondary | ICD-10-CM | POA: Diagnosis not present

## 2018-07-07 DIAGNOSIS — E1140 Type 2 diabetes mellitus with diabetic neuropathy, unspecified: Secondary | ICD-10-CM | POA: Diagnosis not present

## 2018-07-07 DIAGNOSIS — K3184 Gastroparesis: Secondary | ICD-10-CM | POA: Diagnosis not present

## 2018-07-10 ENCOUNTER — Telehealth: Payer: Self-pay | Admitting: Physician Assistant

## 2018-07-10 NOTE — Telephone Encounter (Signed)
Copied from [place] [id number]. Topic: Quick Communication - See Telephone Encounter >> Jul 10, 2018  1:02 PM Burchel, Abbi R wrote: CRM for notification. See Telephone encounter for: 07/10/18.  Levada Dy ([facility]) requesting v/o for Skilled Nursing to continue seeing pt 2x4wk 1x2wk for cardio/pulmonary medication mgmt.    [phone]  *ok to leave VM

## 2018-07-10 NOTE — Telephone Encounter (Signed)
LMOVM for Levada Dy at [place] giving V/O to continue care as discussed with PCP.

## 2018-07-10 NOTE — Telephone Encounter (Signed)
Copied from [place] [phone]. Topic: Quick Communication - Home Health Verbal Orders >> Jul 10, 2018  8:53 AM Berneta Levins wrote: Caller/Agency: Sharyn Lull with [facility] Number: [phone], [place] to leave a message Requesting OT/PT/Skilled Nursing/Social Work: PT - strength and endurance Frequency: twice a week for four weeks, then once a week for two weeks

## 2018-07-10 NOTE — Telephone Encounter (Signed)
Left detailed message on VM advising to continue PT for strength and endurance for twice a week for four weeks, then once a week for two weeks.

## 2018-07-11 ENCOUNTER — Telehealth: Payer: Self-pay | Admitting: Family Medicine

## 2018-07-11 NOTE — Telephone Encounter (Signed)
Received orders by fax, placed in bin upfront w/charge sheet.

## 2018-07-11 NOTE — Telephone Encounter (Signed)
Reviewed and forwarded to supervising MD for co-signature.

## 2018-07-11 NOTE — Telephone Encounter (Signed)
Paperwork will be given to PCP for review and signature.

## 2018-07-12 DIAGNOSIS — I471 Supraventricular tachycardia: Secondary | ICD-10-CM | POA: Diagnosis not present

## 2018-07-12 DIAGNOSIS — I872 Venous insufficiency (chronic) (peripheral): Secondary | ICD-10-CM | POA: Diagnosis not present

## 2018-07-12 DIAGNOSIS — E1140 Type 2 diabetes mellitus with diabetic neuropathy, unspecified: Secondary | ICD-10-CM | POA: Diagnosis not present

## 2018-07-12 DIAGNOSIS — J9622 Acute and chronic respiratory failure with hypercapnia: Secondary | ICD-10-CM | POA: Diagnosis not present

## 2018-07-12 DIAGNOSIS — M797 Fibromyalgia: Secondary | ICD-10-CM | POA: Diagnosis not present

## 2018-07-12 DIAGNOSIS — G894 Chronic pain syndrome: Secondary | ICD-10-CM | POA: Diagnosis not present

## 2018-07-12 DIAGNOSIS — Z9981 Dependence on supplemental oxygen: Secondary | ICD-10-CM | POA: Diagnosis not present

## 2018-07-12 DIAGNOSIS — G4733 Obstructive sleep apnea (adult) (pediatric): Secondary | ICD-10-CM | POA: Diagnosis not present

## 2018-07-12 DIAGNOSIS — E1143 Type 2 diabetes mellitus with diabetic autonomic (poly)neuropathy: Secondary | ICD-10-CM | POA: Diagnosis not present

## 2018-07-12 DIAGNOSIS — I130 Hypertensive heart and chronic kidney disease with heart failure and stage 1 through stage 4 chronic kidney disease, or unspecified chronic kidney disease: Secondary | ICD-10-CM | POA: Diagnosis not present

## 2018-07-12 DIAGNOSIS — I5033 Acute on chronic diastolic (congestive) heart failure: Secondary | ICD-10-CM | POA: Diagnosis not present

## 2018-07-12 DIAGNOSIS — N183 Chronic kidney disease, stage 3 (moderate): Secondary | ICD-10-CM | POA: Diagnosis not present

## 2018-07-12 DIAGNOSIS — M1991 Primary osteoarthritis, unspecified site: Secondary | ICD-10-CM | POA: Diagnosis not present

## 2018-07-12 DIAGNOSIS — G2581 Restless legs syndrome: Secondary | ICD-10-CM | POA: Diagnosis not present

## 2018-07-12 DIAGNOSIS — E1151 Type 2 diabetes mellitus with diabetic peripheral angiopathy without gangrene: Secondary | ICD-10-CM | POA: Diagnosis not present

## 2018-07-12 DIAGNOSIS — J439 Emphysema, unspecified: Secondary | ICD-10-CM | POA: Diagnosis not present

## 2018-07-12 DIAGNOSIS — I2781 Cor pulmonale (chronic): Secondary | ICD-10-CM | POA: Diagnosis not present

## 2018-07-12 DIAGNOSIS — E1122 Type 2 diabetes mellitus with diabetic chronic kidney disease: Secondary | ICD-10-CM | POA: Diagnosis not present

## 2018-07-12 DIAGNOSIS — K3184 Gastroparesis: Secondary | ICD-10-CM | POA: Diagnosis not present

## 2018-07-12 DIAGNOSIS — E039 Hypothyroidism, unspecified: Secondary | ICD-10-CM | POA: Diagnosis not present

## 2018-07-12 DIAGNOSIS — J9621 Acute and chronic respiratory failure with hypoxia: Secondary | ICD-10-CM | POA: Diagnosis not present

## 2018-07-12 NOTE — Telephone Encounter (Signed)
Given to [place] for faxing.

## 2018-07-12 NOTE — Telephone Encounter (Signed)
Renee Noble from [facility] called requesting V/O for wound care. When she arrived at [name] home today, she noticed an open wound from a blister on pt right calf. Romelle Starcher stated that both calves are swollen, red, and warm to touch. Assessed edema 3. Renee Noble call back is [phone].  V/O was given by PCP.

## 2018-07-12 NOTE — Telephone Encounter (Signed)
Pt made an appt 07/13/18 @ 10:15 to address open wound on rt leg. Bethany from [facility] is requesting written orders be faxed to office. She did not have office number or fax.

## 2018-07-12 NOTE — Telephone Encounter (Signed)
Signed and returned to PCP

## 2018-07-12 NOTE — Telephone Encounter (Signed)
Received paperwork from home health. Have written orders but requires MD signature. Will have supervising MD sign in the AM.

## 2018-07-12 NOTE — Telephone Encounter (Signed)
Ok to start wound care. Patient needs to come in and see me for assessment tomorrow morning if possible

## 2018-07-12 NOTE — Telephone Encounter (Signed)
Faxed and copy given to front desk for charges

## 2018-07-13 ENCOUNTER — Ambulatory Visit (INDEPENDENT_AMBULATORY_CARE_PROVIDER_SITE_OTHER): Payer: Medicare Other | Admitting: Physician Assistant

## 2018-07-13 ENCOUNTER — Other Ambulatory Visit: Payer: Self-pay

## 2018-07-13 ENCOUNTER — Encounter: Payer: Self-pay | Admitting: Physician Assistant

## 2018-07-13 VITALS — BP 140/62 | HR 101 | Temp 98.1°F | Resp 18 | Ht 64.0 in | Wt 280.0 lb

## 2018-07-13 DIAGNOSIS — N183 Chronic kidney disease, stage 3 unspecified: Secondary | ICD-10-CM

## 2018-07-13 DIAGNOSIS — L97212 Non-pressure chronic ulcer of right calf with fat layer exposed: Secondary | ICD-10-CM

## 2018-07-13 DIAGNOSIS — I83012 Varicose veins of right lower extremity with ulcer of calf: Secondary | ICD-10-CM

## 2018-07-13 DIAGNOSIS — R609 Edema, unspecified: Secondary | ICD-10-CM

## 2018-07-13 LAB — BASIC METABOLIC PANEL
BUN: 25 mg/dL — AB (ref 6–23)
CHLORIDE: 92 meq/L — AB (ref 96–112)
CO2: 42 mEq/L — ABNORMAL HIGH (ref 19–32)
Calcium: 8.7 mg/dL (ref 8.4–10.5)
Creatinine, Ser: 1.14 mg/dL (ref 0.40–1.20)
GFR: 47.84 mL/min — ABNORMAL LOW (ref >60.00)
Glucose, Bld: 116 mg/dL — ABNORMAL HIGH (ref 70–99)
Potassium: 3.4 mEq/L — ABNORMAL LOW (ref 3.5–5.1)
Sodium: 141 mEq/L (ref 135–145)

## 2018-07-13 NOTE — Progress Notes (Signed)
Patient with history significant for CKD III, Chronic respiratory failure, CHF, venous stasis dermatitis and obesity hypoventilation syndrome. presents to clinic today for assessment of venous stasis ulcer of posterior R calf, noted the other day by home health RN. Area of concern is a more subacute ulceration per patient that had been healing well and was scabbed completely over until a couple of days ago. She endorses increased swelling of extremities despite taking medications as directed. Has noted 5 pound weight gain in 5 days according to home scale. Denies any increased work of breathing Notes O2 satting in 90s on 3L/min at home..  Past Medical History:  Diagnosis Date  . Abdominal mass of other site   . Cervical compression fracture ([place])   . CHF (congestive heart failure) ([place])   . Chronic kidney disease, stage 3 (HCC)    Borderline Stage 2-3  . Chronic lower limb pain   . COPD (chronic obstructive pulmonary disease) ([place])   . CTS (carpal tunnel syndrome)   . Depression   . Diabetes ([place])    Type II  . Fibromyalgia   . Hepatitis C    cured last year (2018)  . Hypercholesteremia   . Hypertension   . Hypothyroidism   . Morbid obesity ([place])   . Neuropathy    Diabetes  . OSA (obstructive sleep apnea)   . Osteoarthritis   . Renal cancer ([place])    Left Kidney Removed  . RLS (restless legs syndrome)   . Syncope   . Venous stasis     Current Outpatient Medications on File Prior to Visit  Medication Sig Dispense Refill  . albuterol (PROAIR HFA) 108 (90 Base) MCG/ACT inhaler Inhale 1-2 puffs into the lungs every 6 (six) hours as needed for wheezing or shortness of breath. 1 Inhaler 5  . budesonide-formoterol (SYMBICORT) 160-4.5 MCG/ACT inhaler Inhale 2 puffs into the lungs 2 (two) times daily. 1 Inhaler 11  . clotrimazole-betamethasone (LOTRISONE) cream Apply 1 application topically 2 (two) times daily as needed. (Patient taking differently: Apply 1 application topically 2  (two) times daily as needed (yeast). ) 45 g 0  . dextromethorphan-guaiFENesin (MUCINEX DM) 30-600 MG 12hr tablet Take 1 tablet by mouth 2 (two) times daily as needed for cough. 20 tablet 0  . diltiazem (CARDIZEM CD) 180 MG 24 hr capsule Take 1 capsule (180 mg total) by mouth daily. 90 capsule 1  . HUMALOG 100 UNIT/ML injection INJECT 0.15 ML (15 UNITS) INTO THE SKIN 3 TIMES DAILY AS NEEDED FOR HIGH BLOOD SUGAR (Patient taking differently: Inject 15 Units into the skin 3 (three) times daily as needed for high blood sugar. Uses sliding scale. Sugar over 200 will give insulin.) 10 mL 0  . insulin glargine (LANTUS) 100 UNIT/ML injection INJECT 0.3MLS (30 UNITS TOTAL) INTO THE SKIN EVERY DAY (Patient taking differently: Inject 30 Units into the skin every morning. INJECT 0.3MLS (30 UNITS TOTAL) INTO THE SKIN EVERY DAY) 30 mL 3  . ipratropium-albuterol (DUONEB) 0.5-2.5 (3) MG/3ML SOLN Take 3 mLs by nebulization 4 (four) times daily. Dx: J44.9 360 mL 5  . levothyroxine (SYNTHROID, LEVOTHROID) 88 MCG tablet Take 1 tablet (88 mcg total) by mouth daily before breakfast. (Patient taking differently: Take 88 mcg by mouth at bedtime. ) 90 tablet 1  . metoCLOPramide (REGLAN) 10 MG tablet Take 1 tablet (10 mg total) by mouth every 6 (six) hours as needed for nausea. (Patient taking differently: Take 10 mg by mouth 2 (two) times daily. ) 120  tablet 1  . morphine (MS CONTIN) 30 MG 12 hr tablet Take 30 mg by mouth every 12 (twelve) hours.  0  . Multiple Vitamins-Minerals (CENTRUM SILVER PO) Take by mouth.    . nortriptyline (PAMELOR) 25 MG capsule Take 1 capsule (25 mg total) by mouth at bedtime. 90 capsule 1  . nystatin (MYCOSTATIN/NYSTOP) powder APPLY TOPICALLY TWICE DAILY AS NEEDED FOR YEAST INFECTION (Patient taking differently: Apply 1 g topically 2 (two) times daily as needed (yeast). ) 15 g 2  . OXYGEN O2 3L at home.    . pravastatin (PRAVACHOL) 20 MG tablet TAKE ONE TABLET BY MOUTH EVERY DAY 90 tablet 0  .  Probiotic Product (PROBIOTIC PO) Take by mouth.    Haig Prophet COMFORT INSULIN SYRINGE 30G X 1/2" 1 ML MISC USE FOUR (4) TIMES DAILY AS DIRECTED (Patient taking differently: Inject 1 Syringe into the skin 4 (four) times daily. ) 100 each 3  . torsemide (DEMADEX) 20 MG tablet Take 1 tablet (20 mg total) by mouth daily. 30 tablet 1   No current facility-administered medications on file prior to visit.     Allergies  Allergen Reactions  . Gabapentin Anaphylaxis  . Lyrica [Pregabalin] Shortness Of Breath    Trouble breathing  . Ketorolac Tromethamine Hives  . Lisinopril Cough    Family History  Problem Relation Age of Onset  . Heart attack Mother 33       Deceased  . Heart disease Mother   . Emphysema Mother   . Alcoholism Mother   . COPD Father 45       Deceased  . Emphysema Father   . Alcoholism Father   . Esophageal varices Father   . Alcoholism Paternal Grandfather   . Diabetes Maternal Grandmother   . Heart disease Maternal Grandmother   . Lung cancer Maternal Grandfather   . Emphysema Maternal Grandfather   . Brain cancer Maternal Aunt   . Diabetes Sister   . Heart defect Sister   . Cancer Sister        was told her sister had cancer but beat it and dont know which one   . Heart defect Sister   . Obesity Son   . Breast cancer Maternal Aunt   . Colon cancer Neg Hx   . Esophageal cancer Neg Hx     Social History   Socioeconomic History  . Marital status: Widowed    Spouse name: Not on file  . Number of children: 1  . Years of education: Not on file  . Highest education level: Not on file  Occupational History  . Occupation: Retired  Scientific laboratory technician  . Financial resource strain: Not on file  . Food insecurity:    Worry: Not on file    Inability: Not on file  . Transportation needs:    Medical: Not on file    Non-medical: Not on file  Tobacco Use  . Smoking status: Former Smoker    Packs/day: 2.50    Years: 45.00    Pack years: 112.50    Types: Cigarettes     Last attempt to quit: 07/21/2016    Years since quitting: 1.9  . Smokeless tobacco: Never Used  Substance and Sexual Activity  . Alcohol use: No  . Drug use: No  . Sexual activity: Never  Lifestyle  . Physical activity:    Days per week: Not on file    Minutes per session: Not on file  . Stress: Not on file  Relationships  . Social connections:    Talks on phone: Not on file    Gets together: Not on file    Attends religious service: Not on file    Active member of club or organization: Not on file    Attends meetings of clubs or organizations: Not on file    Relationship status: Not on file  Other Topics Concern  . Not on file  Social History Narrative  . Not on file    Review of Systems - See HPI.  All other ROS are negative.  BP 140/62   Pulse (!) 101   Temp 98.1 F (36.7 C) (Oral)   Resp 18   Ht '5\' 4"'$  (1.626 m)   Wt 280 lb (127 kg)   SpO2 93% Comment: 3L  BMI 48.06 kg/m   Physical Exam Vitals signs reviewed.  Constitutional:      Appearance: Normal appearance.  HENT:     Head: Normocephalic and atraumatic.     Right Ear: Tympanic membrane normal.     Left Ear: Tympanic membrane normal.     Nose: Nose normal.     Mouth/Throat:     Mouth: Mucous membranes are moist.  Eyes:     Conjunctiva/sclera: Conjunctivae normal.     Pupils: Pupils are equal, round, and reactive to light.  Cardiovascular:     Rate and Rhythm: Normal rate and regular rhythm.     Heart sounds: Normal heart sounds.     Comments: 2+ pitting edema bilaterally with 2 x 2 cm venous stasis ulcer or R posterior calf noted.  Pulmonary:     Effort: Pulmonary effort is normal. No respiratory distress.     Breath sounds: Wheezing (chrinic) present.  Abdominal:     Palpations: Abdomen is soft.  Neurological:     Mental Status: She is alert.     Recent Results (from the past 2160 hour(s))  CBC w/Diff     Status: Abnormal   Collection Time: 04/18/18 11:35 AM  Result Value Ref Range   WBC 9.5  4.0 - 10.5 K/uL   RBC 4.77 3.87 - 5.11 Mil/uL   Hemoglobin 11.8 (L) 12.0 - 15.0 g/dL   HCT 37.8 36.0 - 46.0 %   MCV 79.4 78.0 - 100.0 fl   MCHC 31.1 30.0 - 36.0 g/dL   RDW 18.9 (H) 11.5 - 15.5 %   Platelets 87.0 (L) 150.0 - 400.0 K/uL   Neutrophils Relative % (H) 43.0 - 77.0 %    90.2 Manual smear review agrees with instrument differential.   Lymphocytes Relative 4.9 (L) 12.0 - 46.0 %   Monocytes Relative 4.2 3.0 - 12.0 %   Eosinophils Relative 0.5 0.0 - 5.0 %   Basophils Relative 0.2 0.0 - 3.0 %   Neutro Abs 8.5 (H) 1.4 - 7.7 K/uL   Lymphs Abs 0.5 (L) 0.7 - 4.0 K/uL   Monocytes Absolute 0.4 0.1 - 1.0 K/uL   Eosinophils Absolute 0.0 0.0 - 0.7 K/uL   Basophils Absolute 0.0 0.0 - 0.1 K/uL  Comp Met (CMET)     Status: Abnormal   Collection Time: 04/18/18 11:35 AM  Result Value Ref Range   Sodium 138 135 - 145 mEq/L   Potassium 4.6 3.5 - 5.1 mEq/L   Chloride 91 (L) 96 - 112 mEq/L   CO2 42 (H) 19 - 32 mEq/L   Glucose, Bld 76 70 - 99 mg/dL   BUN 38 (H) 6 - 23 mg/dL   Creatinine, Ser  1.20 0.40 - 1.20 mg/dL   Total Bilirubin 0.6 0.2 - 1.2 mg/dL   Alkaline Phosphatase 49 39 - 117 U/L   AST 16 0 - 37 U/L   ALT 29 0 - 35 U/L   Total Protein 6.2 6.0 - 8.3 g/dL   Albumin 3.8 3.5 - 5.2 g/dL   Calcium 9.2 8.4 - 10.5 mg/dL   GFR 47.96 (L) >60.00 mL/min  Basic metabolic panel     Status: Abnormal   Collection Time: 06/08/18  9:15 PM  Result Value Ref Range   Sodium 139 135 - 145 mmol/L   Potassium 4.2 3.5 - 5.1 mmol/L   Chloride 94 (L) 98 - 111 mmol/L   CO2 38 (H) 22 - 32 mmol/L   Glucose, Bld 121 (H) 70 - 99 mg/dL   BUN 29 (H) 8 - 23 mg/dL   Creatinine, Ser 1.57 (H) 0.44 - 1.00 mg/dL   Calcium 8.9 8.9 - 10.3 mg/dL   GFR calc non Af Amer 34 (L) >60 mL/min   GFR calc Af Amer 40 (L) >60 mL/min   Anion gap 7 5 - 15    Comment: Performed at [facility], [place] [address], [place], [place] [address]  Brain natriuretic peptide     Status: Abnormal   Collection Time:  06/08/18  9:15 PM  Result Value Ref Range   B Natriuretic Peptide 326.6 (H) 0.0 - 100.0 pg/mL    Comment: Performed at [facility], [place] [address] [place], [place] [address]  I-stat troponin, ED     Status: None   Collection Time: 06/08/18  9:54 PM  Result Value Ref Range   Troponin i, poc 0.01 0.00 - 0.08 ng/mL   Comment 3            Comment: Due to the release kinetics of cTnI, a negative result within the first hours of the onset of symptoms does not rule out myocardial infarction with certainty. If myocardial infarction is still suspected, repeat the test at appropriate intervals.   CBC     Status: Abnormal   Collection Time: 06/09/18  4:38 AM  Result Value Ref Range   WBC 4.4 4.0 - 10.5 K/uL   RBC 3.51 (L) 3.87 - 5.11 MIL/uL   Hemoglobin 9.2 (L) 12.0 - 15.0 g/dL   HCT 32.1 (L) 36.0 - 46.0 %   MCV 91.5 80.0 - 100.0 fL   MCH 26.2 26.0 - 34.0 pg   MCHC 28.7 (L) 30.0 - 36.0 g/dL   RDW 17.6 (H) 11.5 - 15.5 %   Platelets 54 (L) 150 - 400 K/uL    Comment: Immature Platelet Fraction may be clinically indicated, consider ordering this additional test [id number] CONSISTENT WITH PREVIOUS RESULT    nRBC 0.0 0.0 - 0.2 %    Comment: Performed at [facility], [place] [address], [place], [place] [address]  HIV antibody     Status: None   Collection Time: 06/09/18  4:38 AM  Result Value Ref Range   HIV Screen 4th Generation wRfx Non Reactive Non Reactive    Comment: (NOTE) Performed At: [facility] [address] [place], [place] [id number] Rush Farmer MD [id number]   Culture, blood (routine x 2) Call MD if unable to obtain prior to antibiotics being given     Status: None   Collection Time: 06/09/18  4:38 AM  Result Value Ref Range   Specimen Description      BLOOD RIGHT HAND  Performed at [facility], [place] [address] [place], [place] [address]    Special Requests      BOTTLES DRAWN AEROBIC AND ANAEROBIC  Blood Culture adequate volume Performed at [place] [address], [place], [place] [address]    Culture      NO GROWTH 5 DAYS Performed at [facility], [place] [address] [place], [place] [address]    Report Status 06/14/2018 FINAL   Culture, blood (routine x 2) Call MD if unable to obtain prior to antibiotics being given     Status: None   Collection Time: 06/09/18  5:03 AM  Result Value Ref Range   Specimen Description      BLOOD BLOOD RIGHT FOREARM Performed at [place] [address] [place], [place] [address]    Special Requests      BOTTLES DRAWN AEROBIC AND ANAEROBIC Blood Culture results may not be optimal due to an excessive volume of blood received in culture bottles Performed at [place] [address], [place], [place] [address]    Culture      NO GROWTH 5 DAYS Performed at [facility], [place] [address], [place], [place] [address]    Report Status 06/14/2018 FINAL   CBG monitoring, ED     Status: Abnormal   Collection Time: 06/09/18  8:39 AM  Result Value Ref Range   Glucose-Capillary 214 (H) 70 - 99 mg/dL  Influenza panel by PCR (type A & B)     Status: None   Collection Time: 06/09/18  8:40 AM  Result Value Ref Range   Influenza A By PCR NEGATIVE NEGATIVE   Influenza B By PCR NEGATIVE NEGATIVE    Comment: (NOTE) The Xpert Xpress Flu assay is intended as an aid in the diagnosis of  influenza and should not be used as a sole basis for treatment.  This  assay is FDA approved for nasopharyngeal swab specimens only. Nasal  washings and aspirates are unacceptable for Xpert Xpress Flu testing. Performed at [facility], [place] [address] [place], [place] [address]   CBG monitoring, ED     Status: None   Collection Time: 06/09/18 11:44 AM  Result Value Ref Range   Glucose-Capillary 88 70 - 99 mg/dL  Glucose, capillary     Status: Abnormal   Collection Time: 06/09/18   4:48 PM  Result Value Ref Range   Glucose-Capillary 236 (H) 70 - 99 mg/dL  Basic metabolic panel     Status: Abnormal   Collection Time: 06/09/18  7:08 PM  Result Value Ref Range   Sodium 137 135 - 145 mmol/L   Potassium 4.1 3.5 - 5.1 mmol/L   Chloride 90 (L) 98 - 111 mmol/L   CO2 35 (H) 22 - 32 mmol/L   Glucose, Bld 270 (H) 70 - 99 mg/dL   BUN 36 (H) 8 - 23 mg/dL   Creatinine, Ser 1.40 (H) 0.44 - 1.00 mg/dL   Calcium 8.7 (L) 8.9 - 10.3 mg/dL   GFR calc non Af Amer 40 (L) >60 mL/min   GFR calc Af Amer 46 (L) >60 mL/min   Anion gap 12 5 - 15    Comment: Performed at [facility], [place] [address] [place],  [address]  Glucose, capillary     Status: Abnormal   Collection Time: 06/09/18  8:56 PM  Result Value Ref Range   Glucose-Capillary 282 (H) 70 -  99 mg/dL  Basic metabolic panel     Status: Abnormal   Collection Time: 06/10/18  7:29 AM  Result Value Ref Range   Sodium 135 135 - 145 mmol/L   Potassium 4.4 3.5 - 5.1 mmol/L   Chloride 89 (L) 98 - 111 mmol/L   CO2 36 (H) 22 - 32 mmol/L   Glucose, Bld 199 (H) 70 - 99 mg/dL   BUN 38 (H) 8 - 23 mg/dL   Creatinine, Ser 1.24 (H) 0.44 - 1.00 mg/dL   Calcium 9.1 8.9 - 10.3 mg/dL   GFR calc non Af Amer 46 (L) >60 mL/min   GFR calc Af Amer 53 (L) >60 mL/min   Anion gap 10 5 - 15    Comment: Performed at [facility], [place] [address], [place], [place] [address]  CBC     Status: Abnormal   Collection Time: 06/10/18  7:29 AM  Result Value Ref Range   WBC 4.6 4.0 - 10.5 K/uL   RBC 3.71 (L) 3.87 - 5.11 MIL/uL   Hemoglobin 9.8 (L) 12.0 - 15.0 g/dL   HCT 33.2 (L) 36.0 - 46.0 %   MCV 89.5 80.0 - 100.0 fL   MCH 26.4 26.0 - 34.0 pg   MCHC 29.5 (L) 30.0 - 36.0 g/dL   RDW 17.2 (H) 11.5 - 15.5 %   Platelets 64 (L) 150 - 400 K/uL    Comment: REPEATED TO VERIFY Immature Platelet Fraction may be clinically indicated, consider ordering this additional test [id number] CONSISTENT WITH PREVIOUS RESULT      nRBC 0.0 0.0 - 0.2 %    Comment: Performed at [facility], [place] [address] [place], [place] [address]  Glucose, capillary     Status: Abnormal   Collection Time: 06/10/18  7:37 AM  Result Value Ref Range   Glucose-Capillary 213 (H) 70 - 99 mg/dL  Glucose, capillary     Status: Abnormal   Collection Time: 06/10/18 12:02 PM  Result Value Ref Range   Glucose-Capillary 216 (H) 70 - 99 mg/dL  Glucose, capillary     Status: Abnormal   Collection Time: 06/10/18  4:15 PM  Result Value Ref Range   Glucose-Capillary 256 (H) 70 - 99 mg/dL  Basic metabolic panel     Status: Abnormal   Collection Time: 06/10/18  8:58 PM  Result Value Ref Range   Sodium 135 135 - 145 mmol/L   Potassium 3.6 3.5 - 5.1 mmol/L    Comment: DELTA CHECK NOTED   Chloride 87 (L) 98 - 111 mmol/L   CO2 35 (H) 22 - 32 mmol/L   Glucose, Bld 219 (H) 70 - 99 mg/dL   BUN 49 (H) 8 - 23 mg/dL   Creatinine, Ser 1.28 (H) 0.44 - 1.00 mg/dL   Calcium 9.2 8.9 - 10.3 mg/dL   GFR calc non Af Amer 44 (L) >60 mL/min   GFR calc Af Amer 51 (L) >60 mL/min   Anion gap 13 5 - 15    Comment: Performed at [facility], [place] [address], [place], [place] [address]  Magnesium     Status: None   Collection Time: 06/10/18  8:58 PM  Result Value Ref Range   Magnesium 2.0 1.7 - 2.4 mg/dL    Comment: Performed at [facility], [place] [address] [place], [place] [address]  Glucose, capillary     Status: Abnormal   Collection Time: 06/10/18  9:42 PM  Result Value Ref Range  Glucose-Capillary 207 (H) 70 - 99 mg/dL  CBC     Status: Abnormal   Collection Time: 06/11/18  4:24 AM  Result Value Ref Range   WBC 4.7 4.0 - 10.5 K/uL   RBC 4.01 3.87 - 5.11 MIL/uL   Hemoglobin 10.5 (L) 12.0 - 15.0 g/dL   HCT 35.1 (L) 36.0 - 46.0 %   MCV 87.5 80.0 - 100.0 fL   MCH 26.2 26.0 - 34.0 pg   MCHC 29.9 (L) 30.0 - 36.0 g/dL   RDW 17.1 (H) 11.5 - 15.5 %   Platelets 67 (L) 150 - 400 K/uL    Comment:  REPEATED TO VERIFY Immature Platelet Fraction may be clinically indicated, consider ordering this additional test [id number] CONSISTENT WITH PREVIOUS RESULT    nRBC 0.0 0.0 - 0.2 %    Comment: Performed at [facility], [place] [address] [place], [place] [address]  Basic metabolic panel     Status: Abnormal   Collection Time: 06/11/18  4:24 AM  Result Value Ref Range   Sodium 135 135 - 145 mmol/L   Potassium 3.8 3.5 - 5.1 mmol/L   Chloride 89 (L) 98 - 111 mmol/L   CO2 33 (H) 22 - 32 mmol/L   Glucose, Bld 215 (H) 70 - 99 mg/dL   BUN 49 (H) 8 - 23 mg/dL   Creatinine, Ser 1.20 (H) 0.44 - 1.00 mg/dL   Calcium 8.9 8.9 - 10.3 mg/dL   GFR calc non Af Amer 48 (L) >60 mL/min   GFR calc Af Amer 55 (L) >60 mL/min   Anion gap 13 5 - 15    Comment: Performed at [facility], [place] [address], [place], [place] [address]  Glucose, capillary     Status: Abnormal   Collection Time: 06/11/18  7:29 AM  Result Value Ref Range   Glucose-Capillary 215 (H) 70 - 99 mg/dL  Glucose, capillary     Status: Abnormal   Collection Time: 06/11/18 12:34 PM  Result Value Ref Range   Glucose-Capillary 220 (H) 70 - 99 mg/dL  Glucose, capillary     Status: Abnormal   Collection Time: 06/11/18  4:06 PM  Result Value Ref Range   Glucose-Capillary 247 (H) 70 - 99 mg/dL  Glucose, capillary     Status: Abnormal   Collection Time: 06/11/18  9:53 PM  Result Value Ref Range   Glucose-Capillary 242 (H) 70 - 99 mg/dL  CBC     Status: Abnormal   Collection Time: 06/12/18  4:10 AM  Result Value Ref Range   WBC 4.3 4.0 - 10.5 K/uL   RBC 4.07 3.87 - 5.11 MIL/uL   Hemoglobin 10.5 (L) 12.0 - 15.0 g/dL   HCT 35.7 (L) 36.0 - 46.0 %   MCV 87.7 80.0 - 100.0 fL   MCH 25.8 (L) 26.0 - 34.0 pg   MCHC 29.4 (L) 30.0 - 36.0 g/dL   RDW 16.7 (H) 11.5 - 15.5 %   Platelets 70 (L) 150 - 400 K/uL    Comment: REPEATED TO VERIFY Immature Platelet Fraction may be clinically indicated, consider ordering  this additional test [id number] CONSISTENT WITH PREVIOUS RESULT    nRBC 0.0 0.0 - 0.2 %    Comment: Performed at [facility], [place] [address] [place], [place] [address]  Basic metabolic panel     Status: Abnormal   Collection Time: 06/12/18  4:10 AM  Result Value Ref Range   Sodium 135 135 - 145 mmol/L  Potassium 4.1 3.5 - 5.1 mmol/L   Chloride 89 (L) 98 - 111 mmol/L   CO2 35 (H) 22 - 32 mmol/L   Glucose, Bld 220 (H) 70 - 99 mg/dL   BUN 60 (H) 8 - 23 mg/dL   Creatinine, Ser 1.46 (H) 0.44 - 1.00 mg/dL   Calcium 8.6 (L) 8.9 - 10.3 mg/dL   GFR calc non Af Amer 38 (L) >60 mL/min   GFR calc Af Amer 44 (L) >60 mL/min   Anion gap 11 5 - 15    Comment: Performed at [facility], [place] [address] [place], [place] [address]  Hemoglobin A1c     Status: None   Collection Time: 06/12/18  4:10 AM  Result Value Ref Range   Hgb A1c MFr Bld 5.6 4.8 - 5.6 %    Comment: (NOTE) Pre diabetes:          5.7%-6.4% Diabetes:              >6.4% Glycemic control for   <7.0% adults with diabetes    Mean Plasma Glucose 114.02 mg/dL    Comment: Performed at [place] [address] [place], [place] [address]  Glucose, capillary     Status: Abnormal   Collection Time: 06/12/18  7:53 AM  Result Value Ref Range   Glucose-Capillary 198 (H) 70 - 99 mg/dL  Glucose, capillary     Status: Abnormal   Collection Time: 06/12/18 11:41 AM  Result Value Ref Range   Glucose-Capillary 208 (H) 70 - 99 mg/dL  Glucose, capillary     Status: Abnormal   Collection Time: 06/12/18  4:55 PM  Result Value Ref Range   Glucose-Capillary 262 (H) 70 - 99 mg/dL  Glucose, capillary     Status: Abnormal   Collection Time: 06/12/18  8:50 PM  Result Value Ref Range   Glucose-Capillary 265 (H) 70 - 99 mg/dL  CBC     Status: Abnormal   Collection Time: 06/13/18  4:31 AM  Result Value Ref Range   WBC 4.7 4.0 - 10.5 K/uL   RBC 4.16 3.87 - 5.11 MIL/uL   Hemoglobin 10.9 (L) 12.0 -  15.0 g/dL   HCT 37.0 36.0 - 46.0 %   MCV 88.9 80.0 - 100.0 fL   MCH 26.2 26.0 - 34.0 pg   MCHC 29.5 (L) 30.0 - 36.0 g/dL   RDW 16.6 (H) 11.5 - 15.5 %   Platelets 70 (L) 150 - 400 K/uL    Comment: Immature Platelet Fraction may be clinically indicated, consider ordering this additional test [id number] CONSISTENT WITH PREVIOUS RESULT    nRBC 0.0 0.0 - 0.2 %    Comment: Performed at [facility], [place] [address] [place], [place] [address]  Basic metabolic panel     Status: Abnormal   Collection Time: 06/13/18  4:31 AM  Result Value Ref Range   Sodium 134 (L) 135 - 145 mmol/L   Potassium 3.8 3.5 - 5.1 mmol/L   Chloride 87 (L) 98 - 111 mmol/L   CO2 35 (H) 22 - 32 mmol/L   Glucose, Bld 269 (H) 70 - 99 mg/dL   BUN 66 (H) 8 - 23 mg/dL   Creatinine, Ser 1.52 (H) 0.44 - 1.00 mg/dL   Calcium 8.4 (L) 8.9 - 10.3 mg/dL   GFR calc non Af Amer 36 (L) >60 mL/min   GFR calc Af Amer 42 (L) >60 mL/min   Anion gap 12 5 - 15  Comment: Performed at [facility], [place] [address], [place], [place] [address]  D-dimer, quantitative (not at [facility])     Status: Abnormal   Collection Time: 06/13/18  7:18 AM  Result Value Ref Range   D-Dimer, Quant 2.04 (H) 0.00 - 0.50 ug/mL-FEU    Comment: (NOTE) At the manufacturer cut-off of 0.50 ug/mL FEU, this assay has been documented to exclude PE with a sensitivity and negative predictive value of 97 to 99%.  At this time, this assay has not been approved by the FDA to exclude DVT/VTE. Results should be correlated with clinical presentation. Performed at [facility], [place] [address], [place], [place] [address]   Glucose, capillary     Status: Abnormal   Collection Time: 06/13/18  7:41 AM  Result Value Ref Range   Glucose-Capillary 224 (H) 70 - 99 mg/dL  Glucose, capillary     Status: Abnormal   Collection Time: 06/13/18 12:10 PM  Result Value Ref Range   Glucose-Capillary 250 (H) 70 - 99 mg/dL    Glucose, capillary     Status: Abnormal   Collection Time: 06/13/18  5:00 PM  Result Value Ref Range   Glucose-Capillary 214 (H) 70 - 99 mg/dL  Glucose, capillary     Status: Abnormal   Collection Time: 06/13/18  9:11 PM  Result Value Ref Range   Glucose-Capillary 249 (H) 70 - 99 mg/dL   Comment 1 Notify RN   CBC     Status: Abnormal   Collection Time: 06/14/18  4:05 AM  Result Value Ref Range   WBC 5.7 4.0 - 10.5 K/uL   RBC 4.35 3.87 - 5.11 MIL/uL   Hemoglobin 11.5 (L) 12.0 - 15.0 g/dL   HCT 38.5 36.0 - 46.0 %   MCV 88.5 80.0 - 100.0 fL   MCH 26.4 26.0 - 34.0 pg   MCHC 29.9 (L) 30.0 - 36.0 g/dL   RDW 16.3 (H) 11.5 - 15.5 %   Platelets 74 (L) 150 - 400 K/uL    Comment: REPEATED TO VERIFY Immature Platelet Fraction may be clinically indicated, consider ordering this additional test [id number] CONSISTENT WITH PREVIOUS RESULT    nRBC 0.0 0.0 - 0.2 %    Comment: Performed at [facility], [place] [address] [place], [place] [address]  Basic metabolic panel     Status: Abnormal   Collection Time: 06/14/18  4:05 AM  Result Value Ref Range   Sodium 134 (L) 135 - 145 mmol/L   Potassium 4.2 3.5 - 5.1 mmol/L   Chloride 90 (L) 98 - 111 mmol/L   CO2 34 (H) 22 - 32 mmol/L   Glucose, Bld 236 (H) 70 - 99 mg/dL   BUN 56 (H) 8 - 23 mg/dL   Creatinine, Ser 1.28 (H) 0.44 - 1.00 mg/dL   Calcium 8.6 (L) 8.9 - 10.3 mg/dL   GFR calc non Af Amer 44 (L) >60 mL/min   GFR calc Af Amer 51 (L) >60 mL/min   Anion gap 10 5 - 15    Comment: Performed at [facility], [place] [address] [place],  [address]  Glucose, capillary     Status: Abnormal   Collection Time: 06/14/18  7:56 AM  Result Value Ref Range   Glucose-Capillary 223 (H) 70 - 99 mg/dL  Glucose, capillary     Status: Abnormal   Collection Time: 06/14/18 12:01 PM  Result Value Ref Range   Glucose-Capillary 273 (H) 70 - 99 mg/dL  Glucose, capillary     Status: Abnormal   Collection Time: 06/14/18   5:10 PM  Result Value Ref Range   Glucose-Capillary 214 (H) 70 - 99 mg/dL  Glucose, capillary     Status: Abnormal   Collection Time: 06/14/18 10:25 PM  Result Value Ref Range   Glucose-Capillary 150 (H) 70 - 99 mg/dL  CBC     Status: Abnormal   Collection Time: 06/15/18  4:11 AM  Result Value Ref Range   WBC 5.5 4.0 - 10.5 K/uL   RBC 4.48 3.87 - 5.11 MIL/uL   Hemoglobin 11.4 (L) 12.0 - 15.0 g/dL   HCT 39.1 36.0 - 46.0 %   MCV 87.3 80.0 - 100.0 fL   MCH 25.4 (L) 26.0 - 34.0 pg   MCHC 29.2 (L) 30.0 - 36.0 g/dL   RDW 16.5 (H) 11.5 - 15.5 %   Platelets 76 (L) 150 - 400 K/uL    Comment: REPEATED TO VERIFY Immature Platelet Fraction may be clinically indicated, consider ordering this additional test [id number] CONSISTENT WITH PREVIOUS RESULT    nRBC 0.0 0.0 - 0.2 %    Comment: Performed at [facility], [place] [address], [place], [place] [address]  Basic metabolic panel     Status: Abnormal   Collection Time: 06/15/18  4:11 AM  Result Value Ref Range   Sodium 135 135 - 145 mmol/L   Potassium 5.0 3.5 - 5.1 mmol/L   Chloride 92 (L) 98 - 111 mmol/L   CO2 33 (H) 22 - 32 mmol/L   Glucose, Bld 220 (H) 70 - 99 mg/dL   BUN 54 (H) 8 - 23 mg/dL   Creatinine, Ser 1.18 (H) 0.44 - 1.00 mg/dL   Calcium 8.6 (L) 8.9 - 10.3 mg/dL   GFR calc non Af Amer 49 (L) >60 mL/min   GFR calc Af Amer 56 (L) >60 mL/min   Anion gap 10 5 - 15    Comment: Performed at [facility], [place] [address] [place], [place] [address]  Glucose, capillary     Status: Abnormal   Collection Time: 06/15/18  7:50 AM  Result Value Ref Range   Glucose-Capillary 178 (H) 70 - 99 mg/dL  Glucose, capillary     Status: Abnormal   Collection Time: 06/15/18 11:38 AM  Result Value Ref Range   Glucose-Capillary 214 (H) 70 - 99 mg/dL  CBC w/Diff     Status: Abnormal   Collection Time: 06/21/18  2:33 PM  Result Value Ref Range   WBC 13.6 (H) 4.0 - 10.5 K/uL   RBC 4.39 3.87 - 5.11 Mil/uL    Hemoglobin 11.4 (L) 12.0 - 15.0 g/dL   HCT 36.4 36.0 - 46.0 %   MCV 82.9 78.0 - 100.0 fl   MCHC 31.4 30.0 - 36.0 g/dL   RDW 18.5 (H) 11.5 - 15.5 %   Platelets 96.0 (L) 150.0 - 400.0 K/uL   Neutrophils Relative % 94.8 (H) 43.0 - 77.0 %    Comment: Specimen too old for Manual Differential to be performed.   Lymphocytes Relative 2.1 (L) 12.0 - 46.0 %   Monocytes Relative 2.6 (L) 3.0 - 12.0 %   Eosinophils Relative 0.2 0.0 - 5.0 %   Basophils Relative 0.3 0.0 - 3.0 %   Neutro Abs 12.9 (H) 1.4 - 7.7 K/uL   Lymphs Abs 0.3 (L) 0.7 - 4.0 K/uL   Monocytes Absolute 0.4 0.1 - 1.0 K/uL   Eosinophils Absolute 0.0 0.0 -  0.7 K/uL   Basophils Absolute 0.0 0.0 - 0.1 K/uL  Basic metabolic panel     Status: Abnormal   Collection Time: 06/21/18  2:33 PM  Result Value Ref Range   Sodium 138 135 - 145 mEq/L   Potassium 4.6 3.5 - 5.1 mEq/L   Chloride 92 (L) 96 - 112 mEq/L   CO2 41 (H) 19 - 32 mEq/L   Glucose, Bld 286 (H) 70 - 99 mg/dL   BUN 50 (H) 6 - 23 mg/dL   Creatinine, Ser 1.15 0.40 - 1.20 mg/dL   Calcium 8.4 8.4 - 10.5 mg/dL   GFR 47.37 (L) >60.00 mL/min    Assessment/Plan: 1. CKD (chronic kidney disease), stage III (HCC) - Basic metabolic panel  2. Venous stasis ulcer of right calf with fat layer exposed, unspecified whether varicose veins present [facility]) - Ambulatory referral to [place] to set up complex wound care.  Increasing her Torsemide to 40 mg daily to pull fluid off. Strict I/O and daily weights at home. Close follow-up scheduled.   3. Peripheral edema Worsened. Discussed ER assessment but patient declines presently. Will increase Torsemide to 40 mg daily. Strict I/O and daily weights discussed. ER for any increase in weight, swelling or any increased work of breathing. She agrees to this.    [name], PA-C

## 2018-07-13 NOTE — Patient Instructions (Signed)
I will be sending order for home health to start wound care at home.   Please keep close eye on daily weight. I am increasing torsemide to 40 mg daily over the next 4 days, then resume 20 mg dose.  Follow-up with me Monday for reassessment.  If there is any further swelling noted with this regimen or you note any shortness of breath, go to the ER immediately.

## 2018-07-14 ENCOUNTER — Encounter (HOSPITAL_COMMUNITY): Payer: Self-pay | Admitting: Emergency Medicine

## 2018-07-14 ENCOUNTER — Emergency Department (HOSPITAL_COMMUNITY): Payer: Medicare Other

## 2018-07-14 ENCOUNTER — Other Ambulatory Visit: Payer: Self-pay | Admitting: Physician Assistant

## 2018-07-14 ENCOUNTER — Other Ambulatory Visit: Payer: Self-pay | Admitting: Gastroenterology

## 2018-07-14 ENCOUNTER — Other Ambulatory Visit: Payer: Self-pay

## 2018-07-14 ENCOUNTER — Inpatient Hospital Stay (HOSPITAL_COMMUNITY)
Admission: EM | Admit: 2018-07-14 | Discharge: 2018-07-17 | DRG: 291 | Disposition: A | Discharge status: Home / Self Care | Payer: Medicare Other | Attending: Internal Medicine | Admitting: Internal Medicine

## 2018-07-14 DIAGNOSIS — Z801 Family history of malignant neoplasm of trachea, bronchus and lung: Secondary | ICD-10-CM

## 2018-07-14 DIAGNOSIS — I10 Essential (primary) hypertension: Secondary | ICD-10-CM | POA: Diagnosis present

## 2018-07-14 DIAGNOSIS — I5033 Acute on chronic diastolic (congestive) heart failure: Secondary | ICD-10-CM | POA: Diagnosis present

## 2018-07-14 DIAGNOSIS — J9611 Chronic respiratory failure with hypoxia: Secondary | ICD-10-CM | POA: Diagnosis present

## 2018-07-14 DIAGNOSIS — T383X5A Adverse effect of insulin and oral hypoglycemic [antidiabetic] drugs, initial encounter: Secondary | ICD-10-CM | POA: Diagnosis not present

## 2018-07-14 DIAGNOSIS — E669 Obesity, unspecified: Secondary | ICD-10-CM | POA: Diagnosis present

## 2018-07-14 DIAGNOSIS — R0989 Other specified symptoms and signs involving the circulatory and respiratory systems: Secondary | ICD-10-CM

## 2018-07-14 DIAGNOSIS — J9621 Acute and chronic respiratory failure with hypoxia: Secondary | ICD-10-CM | POA: Diagnosis not present

## 2018-07-14 DIAGNOSIS — R0602 Shortness of breath: Secondary | ICD-10-CM | POA: Diagnosis not present

## 2018-07-14 DIAGNOSIS — E876 Hypokalemia: Secondary | ICD-10-CM | POA: Diagnosis not present

## 2018-07-14 DIAGNOSIS — Z833 Family history of diabetes mellitus: Secondary | ICD-10-CM

## 2018-07-14 DIAGNOSIS — J439 Emphysema, unspecified: Secondary | ICD-10-CM | POA: Diagnosis not present

## 2018-07-14 DIAGNOSIS — I471 Supraventricular tachycardia: Secondary | ICD-10-CM | POA: Diagnosis not present

## 2018-07-14 DIAGNOSIS — E119 Type 2 diabetes mellitus without complications: Secondary | ICD-10-CM

## 2018-07-14 DIAGNOSIS — E1122 Type 2 diabetes mellitus with diabetic chronic kidney disease: Secondary | ICD-10-CM | POA: Diagnosis not present

## 2018-07-14 DIAGNOSIS — M1991 Primary osteoarthritis, unspecified site: Secondary | ICD-10-CM | POA: Diagnosis not present

## 2018-07-14 DIAGNOSIS — D61818 Other pancytopenia: Secondary | ICD-10-CM | POA: Diagnosis not present

## 2018-07-14 DIAGNOSIS — L8990 Pressure ulcer of unspecified site, unspecified stage: Secondary | ICD-10-CM

## 2018-07-14 DIAGNOSIS — Z794 Long term (current) use of insulin: Principal | ICD-10-CM

## 2018-07-14 DIAGNOSIS — G2581 Restless legs syndrome: Secondary | ICD-10-CM | POA: Diagnosis present

## 2018-07-14 DIAGNOSIS — J9622 Acute and chronic respiratory failure with hypercapnia: Secondary | ICD-10-CM | POA: Diagnosis not present

## 2018-07-14 DIAGNOSIS — N183 Chronic kidney disease, stage 3 unspecified: Secondary | ICD-10-CM | POA: Diagnosis present

## 2018-07-14 DIAGNOSIS — E1142 Type 2 diabetes mellitus with diabetic polyneuropathy: Secondary | ICD-10-CM | POA: Diagnosis not present

## 2018-07-14 DIAGNOSIS — J449 Chronic obstructive pulmonary disease, unspecified: Secondary | ICD-10-CM | POA: Diagnosis not present

## 2018-07-14 DIAGNOSIS — E10649 Type 1 diabetes mellitus with hypoglycemia without coma: Secondary | ICD-10-CM | POA: Diagnosis not present

## 2018-07-14 DIAGNOSIS — E11649 Type 2 diabetes mellitus with hypoglycemia without coma: Secondary | ICD-10-CM | POA: Diagnosis present

## 2018-07-14 DIAGNOSIS — J9612 Chronic respiratory failure with hypercapnia: Secondary | ICD-10-CM | POA: Diagnosis present

## 2018-07-14 DIAGNOSIS — Z85528 Personal history of other malignant neoplasm of kidney: Secondary | ICD-10-CM

## 2018-07-14 DIAGNOSIS — F329 Major depressive disorder, single episode, unspecified: Secondary | ICD-10-CM | POA: Diagnosis present

## 2018-07-14 DIAGNOSIS — Z808 Family history of malignant neoplasm of other organs or systems: Secondary | ICD-10-CM

## 2018-07-14 DIAGNOSIS — Z87891 Personal history of nicotine dependence: Secondary | ICD-10-CM

## 2018-07-14 DIAGNOSIS — E039 Hypothyroidism, unspecified: Secondary | ICD-10-CM | POA: Diagnosis not present

## 2018-07-14 DIAGNOSIS — R161 Splenomegaly, not elsewhere classified: Secondary | ICD-10-CM | POA: Diagnosis not present

## 2018-07-14 DIAGNOSIS — M797 Fibromyalgia: Secondary | ICD-10-CM | POA: Diagnosis present

## 2018-07-14 DIAGNOSIS — I130 Hypertensive heart and chronic kidney disease with heart failure and stage 1 through stage 4 chronic kidney disease, or unspecified chronic kidney disease: Secondary | ICD-10-CM | POA: Diagnosis not present

## 2018-07-14 DIAGNOSIS — E1143 Type 2 diabetes mellitus with diabetic autonomic (poly)neuropathy: Secondary | ICD-10-CM | POA: Diagnosis not present

## 2018-07-14 DIAGNOSIS — G894 Chronic pain syndrome: Secondary | ICD-10-CM | POA: Diagnosis not present

## 2018-07-14 DIAGNOSIS — K3184 Gastroparesis: Secondary | ICD-10-CM | POA: Diagnosis not present

## 2018-07-14 DIAGNOSIS — Z905 Acquired absence of kidney: Secondary | ICD-10-CM

## 2018-07-14 DIAGNOSIS — D696 Thrombocytopenia, unspecified: Secondary | ICD-10-CM | POA: Diagnosis present

## 2018-07-14 DIAGNOSIS — E7800 Pure hypercholesterolemia, unspecified: Secondary | ICD-10-CM | POA: Diagnosis present

## 2018-07-14 DIAGNOSIS — Z9981 Dependence on supplemental oxygen: Secondary | ICD-10-CM | POA: Diagnosis not present

## 2018-07-14 DIAGNOSIS — Z825 Family history of asthma and other chronic lower respiratory diseases: Secondary | ICD-10-CM

## 2018-07-14 DIAGNOSIS — I288 Other diseases of pulmonary vessels: Secondary | ICD-10-CM | POA: Diagnosis not present

## 2018-07-14 DIAGNOSIS — E1140 Type 2 diabetes mellitus with diabetic neuropathy, unspecified: Secondary | ICD-10-CM | POA: Diagnosis not present

## 2018-07-14 DIAGNOSIS — D649 Anemia, unspecified: Secondary | ICD-10-CM | POA: Diagnosis present

## 2018-07-14 DIAGNOSIS — E160 Drug-induced hypoglycemia without coma: Secondary | ICD-10-CM | POA: Diagnosis present

## 2018-07-14 DIAGNOSIS — I872 Venous insufficiency (chronic) (peripheral): Secondary | ICD-10-CM | POA: Diagnosis not present

## 2018-07-14 DIAGNOSIS — Z90710 Acquired absence of both cervix and uterus: Secondary | ICD-10-CM

## 2018-07-14 DIAGNOSIS — Z79890 Hormone replacement therapy (postmenopausal): Secondary | ICD-10-CM

## 2018-07-14 DIAGNOSIS — I509 Heart failure, unspecified: Secondary | ICD-10-CM | POA: Diagnosis not present

## 2018-07-14 DIAGNOSIS — I2781 Cor pulmonale (chronic): Secondary | ICD-10-CM | POA: Diagnosis not present

## 2018-07-14 DIAGNOSIS — E1151 Type 2 diabetes mellitus with diabetic peripheral angiopathy without gangrene: Secondary | ICD-10-CM | POA: Diagnosis not present

## 2018-07-14 DIAGNOSIS — Z7951 Long term (current) use of inhaled steroids: Secondary | ICD-10-CM

## 2018-07-14 DIAGNOSIS — Z803 Family history of malignant neoplasm of breast: Secondary | ICD-10-CM

## 2018-07-14 DIAGNOSIS — Z6841 Body Mass Index (BMI) 40.0 and over, adult: Secondary | ICD-10-CM | POA: Diagnosis not present

## 2018-07-14 DIAGNOSIS — Z79899 Other long term (current) drug therapy: Secondary | ICD-10-CM

## 2018-07-14 DIAGNOSIS — E162 Hypoglycemia, unspecified: Secondary | ICD-10-CM

## 2018-07-14 DIAGNOSIS — G4733 Obstructive sleep apnea (adult) (pediatric): Secondary | ICD-10-CM | POA: Diagnosis not present

## 2018-07-14 DIAGNOSIS — Z79891 Long term (current) use of opiate analgesic: Secondary | ICD-10-CM

## 2018-07-14 DIAGNOSIS — Z8619 Personal history of other infectious and parasitic diseases: Secondary | ICD-10-CM

## 2018-07-14 DIAGNOSIS — E1129 Type 2 diabetes mellitus with other diabetic kidney complication: Secondary | ICD-10-CM | POA: Diagnosis present

## 2018-07-14 DIAGNOSIS — Z8249 Family history of ischemic heart disease and other diseases of the circulatory system: Secondary | ICD-10-CM

## 2018-07-14 LAB — CBC WITH DIFFERENTIAL/PLATELET
Abs Immature Granulocytes: 0.04 10*3/uL (ref 0.00–0.07)
Basophils Absolute: 0 10*3/uL (ref 0.0–0.1)
Basophils Relative: 0 %
EOS PCT: 0 %
Eosinophils Absolute: 0 10*3/uL (ref 0.0–0.5)
HCT: 36.8 % (ref 36.0–46.0)
Hemoglobin: 10.4 g/dL — ABNORMAL LOW (ref 12.0–15.0)
Immature Granulocytes: 1 %
Lymphocytes Relative: 22 %
Lymphs Abs: 1.1 10*3/uL (ref 0.7–4.0)
MCH: 25.4 pg — ABNORMAL LOW (ref 26.0–34.0)
MCHC: 28.3 g/dL — AB (ref 30.0–36.0)
MCV: 90 fL (ref 80.0–100.0)
Monocytes Absolute: 0.6 10*3/uL (ref 0.1–1.0)
Monocytes Relative: 12 %
Neutro Abs: 3.2 10*3/uL (ref 1.7–7.7)
Neutrophils Relative %: 65 %
Platelets: 66 10*3/uL — ABNORMAL LOW (ref 150–400)
RBC: 4.09 MIL/uL (ref 3.87–5.11)
RDW: 17.2 % — ABNORMAL HIGH (ref 11.5–15.5)
WBC: 4.9 10*3/uL (ref 4.0–10.5)
nRBC: 0 % (ref 0.0–0.2)

## 2018-07-14 LAB — GLUCOSE, CAPILLARY: Glucose-Capillary: 26 mg/dL — CL (ref 70–99)

## 2018-07-14 LAB — CBG MONITORING, ED
Glucose-Capillary: 62 mg/dL — ABNORMAL LOW (ref 70–99)
Glucose-Capillary: 54 mg/dL — ABNORMAL LOW (ref 70–99)
Glucose-Capillary: 85 mg/dL (ref 70–99)
Glucose-Capillary: 147 mg/dL — ABNORMAL HIGH (ref 70–99)

## 2018-07-14 LAB — BASIC METABOLIC PANEL
Anion gap: 10 (ref 5–15)
BUN: 26 mg/dL — ABNORMAL HIGH (ref 8–23)
CALCIUM: 8.8 mg/dL — AB (ref 8.9–10.3)
CO2: 39 mmol/L — ABNORMAL HIGH (ref 22–32)
Chloride: 91 mmol/L — ABNORMAL LOW (ref 98–111)
Creatinine, Ser: 1.1 mg/dL — ABNORMAL HIGH (ref 0.44–1.00)
GFR calc Af Amer: >60 mL/min (ref >60)
GFR calc non Af Amer: 53 mL/min — ABNORMAL LOW (ref >60)
Glucose, Bld: 30 mg/dL — CL (ref 70–99)
Potassium: 2.8 mmol/L — ABNORMAL LOW (ref 3.5–5.1)
Sodium: 140 mmol/L (ref 135–145)

## 2018-07-14 LAB — BRAIN NATRIURETIC PEPTIDE: B Natriuretic Peptide: 581 pg/mL — ABNORMAL HIGH (ref 0.0–100.0)

## 2018-07-14 LAB — TROPONIN I: Troponin I: 0.03 ng/mL (ref <0.03)

## 2018-07-14 MED ORDER — DEXTROSE-NACL 5-0.45 % IV SOLN
INTRAVENOUS | Status: DC
  Administered 2018-07-14: 20:00:00 via INTRAVENOUS

## 2018-07-14 MED ORDER — FUROSEMIDE 10 MG/ML IJ SOLN
40.0000 mg | Freq: Once | INTRAMUSCULAR | Status: AC
  Administered 2018-07-14: 40 mg via INTRAVENOUS
  Filled 2018-07-14: qty 4

## 2018-07-14 MED ORDER — ALBUTEROL SULFATE (2.5 MG/3ML) 0.083% IN NEBU
5.0000 mg | INHALATION_SOLUTION | Freq: Once | RESPIRATORY_TRACT | Status: AC
  Administered 2018-07-14: 5 mg via RESPIRATORY_TRACT
  Filled 2018-07-14: qty 6

## 2018-07-14 MED ORDER — POTASSIUM CHLORIDE CRYS ER 20 MEQ PO TBCR
40.0000 meq | EXTENDED_RELEASE_TABLET | Freq: Once | ORAL | Status: AC
  Administered 2018-07-14: 40 meq via ORAL
  Filled 2018-07-14: qty 2

## 2018-07-14 MED ORDER — POTASSIUM CHLORIDE 10 MEQ/100ML IV SOLN
10.0000 meq | INTRAVENOUS | Status: AC
  Administered 2018-07-14 (×3): 10 meq via INTRAVENOUS
  Filled 2018-07-14 (×3): qty 100

## 2018-07-14 NOTE — ED Notes (Signed)
Pt is eating a reeses cup upon arrival to hallway 2 bed

## 2018-07-14 NOTE — ED Triage Notes (Signed)
Patient with history of CHF, has been gaining weight and putting on fluid. Edema in bilat LE. Patient c/o SOB. SOB started today.

## 2018-07-14 NOTE — ED Notes (Signed)
CRITICAL VALUE ALERT  Critical Value:  Troponin 0.03  Date & Time Notied:  07/14/18 & 2126 hrs  Provider Notified: Dr. Eulis Foster  Orders Received/Actions taken: N/A

## 2018-07-14 NOTE — Progress Notes (Signed)
Attempted to administer nebulizer treatment at  Approximately 1840, the patient was eating dinner and requested I return later.

## 2018-07-14 NOTE — ED Provider Notes (Addendum)
[facility] EMERGENCY DEPARTMENT Provider Note   CSN: [id number] Arrival date & time: 07/14/18  1723    History   Chief Complaint Chief Complaint  Patient presents with  . Shortness of Breath    HPI Renee Noble is a 63 y.o. female.     HPI   Patient presents for evaluation of shortness of breath, and weight gain, and concern that she is "keeping fluid."  She doubled up on her torsemide yesterday taking 40 mg again today 40 mg, without relief.  She denies chest pain, back pain, headache, weakness or dizziness.  She gets breath with ambulation.  She does not currently have a cardiologist.  She states that she has chronic kidney disease, and has been urinating less than usual recently.  He does not feel that the increased dosage of torsemide helped her to urinate.  There is been no fever, chills, cough.  There are no other known modifying factors.  Past Medical History:  Diagnosis Date  . Abdominal mass of other site   . Cervical compression fracture ([place])   . CHF (congestive heart failure) ([place])   . Chronic kidney disease, stage 3 (HCC)    Borderline Stage 2-3  . Chronic lower limb pain   . COPD (chronic obstructive pulmonary disease) ([place])   . CTS (carpal tunnel syndrome)   . Depression   . Diabetes ([place])    Type II  . Fibromyalgia   . Hepatitis C    cured last year (2018)  . Hypercholesteremia   . Hypertension   . Hypothyroidism   . Morbid obesity ([place])   . Neuropathy    Diabetes  . OSA (obstructive sleep apnea)   . Osteoarthritis   . 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 RLS (restless legs syndrome)   . Syncope   . Venous stasis     Patient Active Problem List   Diagnosis Date Noted  . Acute on chronic respiratory failure with hypoxia ([place]) 06/09/2018  . HLD (hyperlipidemia) 06/09/2018  . Hypothyroidism 06/09/2018  . Depression 06/09/2018  . COPD exacerbation ([place]) 06/09/2018  . Fibromyalgia   . Lung nodule 05/08/2018  . Morbid obesity with  BMI of 50.0-59.9, adult ([place]) 04/10/2018  . Chronic pain 04/07/2018  . Acute on chronic diastolic CHF (congestive heart failure) ([place]) 04/07/2018  . PNA (pneumonia) 03/21/2018  . COPD (chronic obstructive pulmonary disease) ([place]) 02/25/2018  . Chronic pain syndrome   . Normocytic anemia 02/24/2018  . OSA (obstructive sleep apnea) 08/16/2017  . Morbid obesity due to excess calories ([place]) 04/29/2017  . Cor pulmonale (chronic) ([place]) 04/29/2017  . Chronic respiratory failure with hypoxia and hypercapnia ([place]) 04/28/2017  . CKD (chronic kidney disease), stage III ([place]) 04/14/2016  . Hyperlipidemia 01/16/2016  . Essential hypertension 01/16/2016  . Hypoxia   . Thrombocytopenia ([place]) 07/12/2015  . Thyroid activity decreased 06/23/2015  . Encounter for chronic pain management 09/02/2014  . Hernia of abdominal cavity 07/30/2014  . Abnormal CXR 02/15/2014  . COPD GOLDIII with min reversibility  12/16/2013  . History of hepatitis C 12/16/2013  . Breast cancer screening 12/16/2013  . Type II diabetes mellitus with renal manifestations ([place]) 12/16/2013  . Screening for osteoporosis 12/16/2013    Past Surgical History:  Procedure Laterality Date  . ABDOMINAL HERNIA REPAIR     x2  . ABDOMINAL HYSTERECTOMY    . BLADDER SUSPENSION    . CHOLECYSTECTOMY    . CYST EXCISION     Head  .  INCISIONAL HERNIA REPAIR    . KNEE ARTHROSCOPY     Bilateral  . NEPHRECTOMY     Left  . TONSILLECTOMY    . TOOTH EXTRACTION    . UMBILICAL HERNIA REPAIR    . VAGINA SURGERY    . WISDOM TOOTH EXTRACTION       OB History    Gravida  8   Para  1   Term  1   Preterm      AB  7   Living        SAB  7   TAB      Ectopic      Multiple      Live Births               Home Medications    Prior to Admission medications   Medication Sig Start Date End Date Taking? Authorizing Provider  albuterol (PROAIR HFA) 108 (90 Base) MCG/ACT inhaler Inhale 1-2 puffs into the lungs every 6 (six)  hours as needed for wheezing or shortness of breath. 01/20/18  Yes Tanda Rockers, MD  budesonide-formoterol (SYMBICORT) 160-4.5 MCG/ACT inhaler Inhale 2 puffs into the lungs 2 (two) times daily. 06/29/18  Yes Tanda Rockers, MD  clotrimazole-betamethasone (LOTRISONE) cream Apply 1 application topically 2 (two) times daily as needed. Patient taking differently: Apply 1 application topically 2 (two) times daily as needed (yeast).  05/02/18  Yes [name], PA-C  dextromethorphan-guaiFENesin [facility] DM) 30-600 MG 12hr tablet Take 1 tablet by mouth 2 (two) times daily as needed for cough. 06/15/18  Yes Hosie Poisson, MD  diltiazem (CARDIZEM CD) 180 MG 24 hr capsule Take 1 capsule (180 mg total) by mouth daily. 06/02/18  Yes [name], PA-C  insulin glargine (LANTUS) 100 UNIT/ML injection INJECT 0.3MLS (30 UNITS TOTAL) INTO THE SKIN EVERY DAY Patient taking differently: Inject 30 Units into the skin every morning. INJECT 0.3MLS (30 UNITS TOTAL) INTO THE SKIN EVERY DAY 01/24/18  Yes [name], PA-C  insulin lispro (HUMALOG) 100 UNIT/ML injection Inject 0.15 mLs (15 Units total) into the skin 3 (three) times daily as needed for high blood sugar. Uses sliding scale. Sugar over 200 will give insulin. 07/14/18  Yes [name], PA-C  ipratropium-albuterol (DUONEB) 0.5-2.5 (3) MG/3ML SOLN Take 3 mLs by nebulization 4 (four) times daily. Dx: J44.9 03/21/18  Yes Parrett, [name] S, NP  levothyroxine (SYNTHROID, LEVOTHROID) 88 MCG tablet Take 1 tablet (88 mcg total) by mouth daily before breakfast. Patient taking differently: Take 88 mcg by mouth at bedtime.  12/08/17  Yes [name], PA-C  metoCLOPramide (REGLAN) 10 MG tablet Take 1 tablet (10 mg total) by mouth every 6 (six) hours as needed for nausea. Patient taking differently: Take 10 mg by mouth 2 (two) times daily.  01/17/18  Yes Cirigliano, Vito V, DO  morphine (MS CONTIN) 30 MG 12 hr tablet Take 30 mg by mouth every 12 (twelve)  hours. 03/23/18  Yes [provider]  Multiple Vitamins-Minerals (CENTRUM SILVER PO) Take by mouth.   Yes [provider]  nortriptyline (PAMELOR) 25 MG capsule Take 1 capsule (25 mg total) by mouth at bedtime. 06/21/18  Yes [name], PA-C  nystatin (MYCOSTATIN/NYSTOP) powder APPLY TOPICALLY TWICE DAILY AS NEEDED FOR YEAST INFECTION Patient taking differently: Apply 1 g topically 2 (two) times daily as needed (yeast).  09/27/17  Yes [name], PA-C  OXYGEN O2 3.5L at home.   Yes [provider]  pravastatin (PRAVACHOL) 20 MG tablet TAKE ONE TABLET BY MOUTH EVERY DAY 05/05/18  Yes [name], PA-C  Probiotic Product (PROBIOTIC PO) Take by mouth.   Yes [provider]  torsemide (DEMADEX) 20 MG tablet Take 1 tablet (20 mg total) by mouth daily. Patient taking differently: Take 40 mg by mouth daily. Take 40 mg daily for 4 days then reduce dose to 20 mg daily starting on 07/13/18 04/12/18  Yes Tat, Shanon Brow, MD  SURE COMFORT INSULIN SYRINGE 30G X 1/2" 1 ML MISC USE FOUR (4) TIMES DAILY AS DIRECTED Patient taking differently: Inject 1 Syringe into the skin 4 (four) times daily.  08/31/16   [name], PA-C    Family History Family History  Problem Relation Age of Onset  . Heart attack Mother 82       Deceased  . Heart disease Mother   . Emphysema Mother   . Alcoholism Mother   . COPD Father 52       Deceased  . Emphysema Father   . Alcoholism Father   . Esophageal varices Father   . Alcoholism Paternal Grandfather   . Diabetes Maternal Grandmother   . Heart disease Maternal Grandmother   . Lung cancer Maternal Grandfather   . Emphysema Maternal Grandfather   . Brain cancer Maternal Aunt   . Diabetes Sister   . Heart defect Sister   . Cancer Sister        was told her sister had cancer but beat it and dont know which one   . Heart defect Sister   . Obesity Son   . Breast cancer Maternal Aunt   . Colon cancer Neg Hx   .  Esophageal cancer Neg Hx     Social History Social History   Tobacco Use  . Smoking status: Former Smoker    Packs/day: 2.50    Years: 45.00    Pack years: 112.50    Types: Cigarettes    Last attempt to quit: 07/21/2016    Years since quitting: 1.9  . Smokeless tobacco: Never Used  Substance Use Topics  . Alcohol use: No  . Drug use: No     Allergies   Gabapentin; Lyrica [pregabalin]; Ketorolac tromethamine; and Lisinopril   Review of Systems Review of Systems  All other systems reviewed and are negative.    Physical Exam Updated Vital Signs BP (!) 128/44   Pulse (!) 105   Temp 97.6 F (36.4 C) (Oral)   Resp 11   Ht 5\' 4"  (1.626 m)   Wt 127 kg   SpO2 98%   BMI 48.06 kg/m   Physical Exam Vitals signs and nursing note reviewed.  Constitutional:      General: She is not in acute distress.    Appearance: She is well-developed. She is ill-appearing. She is not toxic-appearing or diaphoretic.  HENT:     Head: Normocephalic and atraumatic.  Eyes:     Conjunctiva/sclera: Conjunctivae normal.     Pupils: Pupils are equal, round, and reactive to light.  Neck:     Musculoskeletal: Normal range of motion and neck supple.     Trachea: Phonation normal.  Cardiovascular:     Rate and Rhythm: Normal rate and regular rhythm.     Heart sounds: Normal heart sounds.  Pulmonary:     Effort: Pulmonary effort is normal. No respiratory distress.     Breath sounds: Normal breath sounds. No stridor.  Chest:     Chest  wall: No tenderness.  Abdominal:     General: There is no distension.     Palpations: Abdomen is soft.     Tenderness: There is no abdominal tenderness. There is no guarding.  Musculoskeletal: Normal range of motion.  Skin:    General: Skin is warm and dry.  Neurological:     Mental Status: She is alert and oriented to person, place, and time.     Motor: No abnormal muscle tone.  Psychiatric:        Mood and Affect: Mood normal.        Behavior: Behavior  normal.        Thought Content: Thought content normal.        Judgment: Judgment normal.      ED Treatments / Results  Labs (all labs ordered are listed, but only abnormal results are displayed) Labs Reviewed  BRAIN NATRIURETIC PEPTIDE - Abnormal; Notable for the following components:      Result Value   B Natriuretic Peptide 581.0 (*)    All other components within normal limits  GLUCOSE, CAPILLARY - Abnormal; Notable for the following components:   Glucose-Capillary 26 (*)    All other components within normal limits  BASIC METABOLIC PANEL - Abnormal; Notable for the following components:   Potassium 2.8 (*)    Chloride 91 (*)    CO2 39 (*)    Glucose, Bld 30 (*)    BUN 26 (*)    Creatinine, Ser 1.10 (*)    Calcium 8.8 (*)    GFR calc non Af Amer 53 (*)    All other components within normal limits  CBC WITH DIFFERENTIAL/PLATELET - Abnormal; Notable for the following components:   Hemoglobin 10.4 (*)    MCH 25.4 (*)    MCHC 28.3 (*)    RDW 17.2 (*)    Platelets 66 (*)    All other components within normal limits  TROPONIN I - Abnormal; Notable for the following components:   Troponin I 0.03 (*)    All other components within normal limits  CBG MONITORING, ED - Abnormal; Notable for the following components:   Glucose-Capillary 62 (*)    All other components within normal limits  CBG MONITORING, ED - Abnormal; Notable for the following components:   Glucose-Capillary 54 (*)    All other components within normal limits  CBG MONITORING, ED  CBG MONITORING, ED    EKG EKG Interpretation  Date/Time:  Friday July 14 2018 17:37:34 EST Ventricular Rate:  102 PR Interval:    QRS Duration: 96 QT Interval:  348 QTC Calculation: 453 R Axis:   -25 Text Interpretation:  Sinus tachycardia with Premature atrial complexes Anterolateral infarct , age undetermined Abnormal ECG Since last tracing rate faster Confirmed by Daleen Bo [phone]) on 07/14/2018 6:11:51  PM   Radiology Dg Chest Port 1 View  Result Date: 07/14/2018 CLINICAL DATA:  Shortness of breath EXAM: PORTABLE CHEST 1 VIEW COMPARISON:  06/08/2018 FINDINGS: Cardiomegaly with vascular congestion. Mild peribronchial thickening. No confluent airspace opacities, effusions or overt edema. No acute bony abnormality. IMPRESSION: Cardiomegaly, mild vascular congestion.  Bronchitic changes. Electronically Signed   By: Rolm Baptise M.D.   On: 07/14/2018 18:37    Procedures .Critical Care Performed by: Daleen Bo, MD Authorized by: Daleen Bo, MD   Critical care provider statement:    Critical care time (minutes):  40   Critical care start time:  07/14/2018 6:05 PM   Critical care end time:  07/14/2018 9:55 PM   Critical care time was exclusive of:  Separately billable procedures and treating other patients   Critical care was necessary to treat or prevent imminent or life-threatening deterioration of the following conditions:  Respiratory failure and metabolic crisis   Critical care was time spent personally by me on the following activities:  Blood draw for specimens, development of treatment plan with patient or surrogate, discussions with consultants, evaluation of patient's response to treatment, examination of patient, obtaining history from patient or surrogate, ordering and performing treatments and interventions, ordering and review of laboratory studies, pulse oximetry, re-evaluation of patient's condition, review of old charts and ordering and review of radiographic studies   (including critical care time)  Medications Ordered in ED Medications  potassium chloride 10 mEq in 100 mL IVPB (10 mEq Intravenous New Bag/Given 07/14/18 2126)  dextrose 5 %-0.45 % sodium chloride infusion ( Intravenous Rate/Dose Verify 07/14/18 2126)  albuterol (PROVENTIL) (2.5 MG/3ML) 0.083% nebulizer solution 5 mg (5 mg Nebulization Given 07/14/18 2023)  furosemide (LASIX) injection 40 mg (40 mg  Intravenous Given 07/14/18 2009)  potassium chloride SA (K-DUR,KLOR-CON) CR tablet 40 mEq (40 mEq Oral Given 07/14/18 2007)     Initial Impression / Assessment and Plan / ED Course  I have reviewed the triage vital signs and the nursing notes.  Pertinent labs & imaging results that were available during my care of the patient were reviewed by me and considered in my medical decision making (see chart for details).  Clinical Course as of Jul 14 2141  Fri Jul 14, 2018  1942 Low  POC CBG, ED(!) [EW]  1942 Elevated  Brain natriuretic peptide(!) [EW]  1943 Normal except hemoglobin low  CBC with Differential(!) [EW]  1943 Hemoglobin trending indicates down 1 g from 3 weeks ago.   [EW]  1943 Normal except potassium low, chloride low, CO2 high, glucose low, BUN high, creatinine high, calcium low, GFR low  Basic metabolic panel(!!) [EW]  9798 Mild pulmonary vascular congestion, images reviewed by me  DG Chest Port 1 View [EW]  2006 Patient is alert and comfortable.  She states that she has been eating, had dinner, and several snacks including crackers and ginger ale, and her CBG remains low.  She took her usual morning dose of Lantus, 30 units and 15 units of Humalog around 10 AM for sliding scale treatment.  She was otherwise eating normally today.   [EW]  2140 Borderline elevated  Troponin I - Once(!!) [EW]    Clinical Course User Index [EW] Daleen Bo, MD        Patient Vitals for the past 24 hrs:  BP Temp Temp src Pulse Resp SpO2 Height Weight  07/14/18 2126 - - - - - 98 % - -  07/14/18 2100 (!) 128/44 - - - 11 - - -  07/14/18 2052 - - - (!) 105 14 - - -  07/14/18 2030 135/63 - - (!) 103 (!) 8 - - -  07/14/18 2023 - - - - - 100 % - -  07/14/18 2000 (!) 130/58 - - (!) 107 (!) 30 - - -  07/14/18 1930 107/88 - - 99 17 - - -  07/14/18 1839 - - - - 15 - - -  07/14/18 1743 (!) 164/57 97.6 F (36.4 C) Oral 74 (!) 22 (!) 88 % - -  07/14/18 1737 - - - - - - 5\' 4"  (1.626 m) 127 kg     9:50 PM Reevaluation  with update and discussion. After initial assessment and treatment, an updated evaluation reveals she remains stable alert and cooperative.Daleen Bo   Medical Decision Making: Patient with shortness of breath and weight gain.  She does not have known congestive heart failure.  Last cardiac echo, 04/08/2018 showed ejection fraction 60 to 65%, the study was technically inadequate to assess for LV diastolic function.  The patient is not currently having chest  pain.  Troponin is 0.3, borderline elevated.  Doubt ACS.  She is taking torsemide,  double the dose, without relief, for 2 days.  Potassium is low, and requires supplementation.  Incidental hypoglycemia, is nonspecific.  Patient is diabetic on insulin.  She was able to eat here but that blood sugar kept dropping requiring D5 administration, by IV.  Patient requires hospitalization for treatment.  There are no objective signs for acute bacterial infection, or suggestion for metabolic instability.  CRITICAL CARE-yes Performed by: Daleen Bo  Nursing Notes Reviewed/ Care Coordinated Applicable Imaging Reviewed Interpretation of Laboratory Data incorporated into ED treatment  10:01 PM-Consult complete with hospitalist. Patient case explained and discussed.  She agrees to admit patient for further evaluation and treatment. Call ended at 10:10 PM  Plan: Admit   Final Clinical Impressions(s) / ED Diagnoses   Final diagnoses:  Pulmonary vascular congestion  Hypokalemia  Hypoglycemia    ED Discharge Orders    None       Daleen Bo, MD 07/14/18 2211    Daleen Bo, MD 07/24/18 1119

## 2018-07-14 NOTE — ED Notes (Signed)
Pt ambulated to restroom without assistance.

## 2018-07-15 ENCOUNTER — Other Ambulatory Visit: Payer: Self-pay

## 2018-07-15 DIAGNOSIS — I5033 Acute on chronic diastolic (congestive) heart failure: Secondary | ICD-10-CM

## 2018-07-15 LAB — BASIC METABOLIC PANEL
Anion gap: 7 (ref 5–15)
BUN: 21 mg/dL (ref 8–23)
CO2: 42 mmol/L — ABNORMAL HIGH (ref 22–32)
Calcium: 7.9 mg/dL — ABNORMAL LOW (ref 8.9–10.3)
Chloride: 93 mmol/L — ABNORMAL LOW (ref 98–111)
Creatinine, Ser: 1.15 mg/dL — ABNORMAL HIGH (ref 0.44–1.00)
GFR calc Af Amer: 58 mL/min — ABNORMAL LOW (ref >60)
GFR, EST NON AFRICAN AMERICAN: 50 mL/min — AB (ref >60)
Glucose, Bld: 163 mg/dL — ABNORMAL HIGH (ref 70–99)
Potassium: 4 mmol/L (ref 3.5–5.1)
Sodium: 142 mmol/L (ref 135–145)

## 2018-07-15 LAB — GLUCOSE, CAPILLARY
GLUCOSE-CAPILLARY: 122 mg/dL — AB (ref 70–99)
Glucose-Capillary: 140 mg/dL — ABNORMAL HIGH (ref 70–99)
Glucose-Capillary: 130 mg/dL — ABNORMAL HIGH (ref 70–99)
Glucose-Capillary: 129 mg/dL — ABNORMAL HIGH (ref 70–99)
Glucose-Capillary: 130 mg/dL — ABNORMAL HIGH (ref 70–99)
Glucose-Capillary: 121 mg/dL — ABNORMAL HIGH (ref 70–99)
Glucose-Capillary: 165 mg/dL — ABNORMAL HIGH (ref 70–99)
Glucose-Capillary: 203 mg/dL — ABNORMAL HIGH (ref 70–99)
Glucose-Capillary: 166 mg/dL — ABNORMAL HIGH (ref 70–99)

## 2018-07-15 LAB — MRSA PCR SCREENING: MRSA by PCR: NEGATIVE

## 2018-07-15 LAB — CBG MONITORING, ED: Glucose-Capillary: 186 mg/dL — ABNORMAL HIGH (ref 70–99)

## 2018-07-15 MED ORDER — MORPHINE SULFATE ER 15 MG PO TBCR
30.0000 mg | EXTENDED_RELEASE_TABLET | Freq: Two times a day (BID) | ORAL | Status: DC
  Administered 2018-07-15 – 2018-07-17 (×6): 30 mg via ORAL
  Filled 2018-07-15 (×6): qty 2

## 2018-07-15 MED ORDER — MOMETASONE FURO-FORMOTEROL FUM 200-5 MCG/ACT IN AERO
2.0000 | INHALATION_SPRAY | Freq: Two times a day (BID) | RESPIRATORY_TRACT | Status: DC
  Administered 2018-07-15 – 2018-07-17 (×5): 2 via RESPIRATORY_TRACT
  Filled 2018-07-15: qty 8.8

## 2018-07-15 MED ORDER — ALBUTEROL SULFATE (2.5 MG/3ML) 0.083% IN NEBU: 3.0000 mL | INHALATION_SOLUTION | Freq: Four times a day (QID) | RESPIRATORY_TRACT | Status: DC | PRN

## 2018-07-15 MED ORDER — FUROSEMIDE 10 MG/ML IJ SOLN
40.0000 mg | Freq: Two times a day (BID) | INTRAMUSCULAR | Status: DC
  Administered 2018-07-15 – 2018-07-16 (×4): 40 mg via INTRAVENOUS
  Filled 2018-07-15 (×4): qty 4

## 2018-07-15 MED ORDER — IPRATROPIUM-ALBUTEROL 0.5-2.5 (3) MG/3ML IN SOLN
3.0000 mL | Freq: Four times a day (QID) | RESPIRATORY_TRACT | Status: DC
  Administered 2018-07-15 – 2018-07-16 (×5): 3 mL via RESPIRATORY_TRACT
  Filled 2018-07-15 (×4): qty 3

## 2018-07-15 MED ORDER — DM-GUAIFENESIN ER 30-600 MG PO TB12: 1.0000 | ORAL_TABLET | Freq: Two times a day (BID) | ORAL | Status: DC | PRN

## 2018-07-15 MED ORDER — FUROSEMIDE 10 MG/ML IJ SOLN: 40.0000 mg | Freq: Every day | INTRAMUSCULAR | Status: DC

## 2018-07-15 MED ORDER — INSULIN ASPART 100 UNIT/ML ~~LOC~~ SOLN
0.0000 [IU] | Freq: Three times a day (TID) | SUBCUTANEOUS | Status: DC
  Administered 2018-07-15: 2 [IU] via SUBCUTANEOUS
  Administered 2018-07-15: 3 [IU] via SUBCUTANEOUS
  Administered 2018-07-16 (×2): 2 [IU] via SUBCUTANEOUS
  Administered 2018-07-17: 1 [IU] via SUBCUTANEOUS

## 2018-07-15 MED ORDER — SODIUM CHLORIDE 0.9% FLUSH
3.0000 mL | Freq: Two times a day (BID) | INTRAVENOUS | Status: DC
  Administered 2018-07-15 – 2018-07-17 (×5): 3 mL via INTRAVENOUS

## 2018-07-15 MED ORDER — ACETAMINOPHEN 325 MG PO TABS: 650.0000 mg | ORAL_TABLET | ORAL | Status: DC | PRN

## 2018-07-15 MED ORDER — ONDANSETRON HCL 4 MG/2ML IJ SOLN
4.0000 mg | Freq: Four times a day (QID) | INTRAMUSCULAR | Status: DC | PRN
  Administered 2018-07-16: 4 mg via INTRAVENOUS
  Filled 2018-07-15: qty 2

## 2018-07-15 MED ORDER — SODIUM CHLORIDE 0.9% FLUSH: 3.0000 mL | INTRAVENOUS | Status: DC | PRN

## 2018-07-15 MED ORDER — DILTIAZEM HCL ER COATED BEADS 180 MG PO CP24
180.0000 mg | ORAL_CAPSULE | Freq: Every day | ORAL | Status: DC
  Administered 2018-07-15 – 2018-07-17 (×3): 180 mg via ORAL
  Filled 2018-07-15 (×6): qty 1

## 2018-07-15 MED ORDER — PRAVASTATIN SODIUM 10 MG PO TABS
20.0000 mg | ORAL_TABLET | Freq: Every day | ORAL | Status: DC
  Administered 2018-07-15 – 2018-07-17 (×3): 20 mg via ORAL
  Filled 2018-07-15 (×3): qty 1
  Filled 2018-07-15 (×4): qty 2

## 2018-07-15 MED ORDER — SENNOSIDES-DOCUSATE SODIUM 8.6-50 MG PO TABS
1.0000 | ORAL_TABLET | Freq: Every morning | ORAL | Status: DC
  Administered 2018-07-15 – 2018-07-17 (×3): 1 via ORAL
  Filled 2018-07-15 (×3): qty 1

## 2018-07-15 MED ORDER — INSULIN ASPART 100 UNIT/ML ~~LOC~~ SOLN: 0.0000 [IU] | Freq: Every day | SUBCUTANEOUS | Status: DC

## 2018-07-15 MED ORDER — LEVOTHYROXINE SODIUM 88 MCG PO TABS
88.0000 ug | ORAL_TABLET | Freq: Every day | ORAL | Status: DC
  Administered 2018-07-16 – 2018-07-17 (×2): 88 ug via ORAL
  Filled 2018-07-15 (×2): qty 1

## 2018-07-15 MED ORDER — SODIUM CHLORIDE 0.9 % IV SOLN: 250.0000 mL | INTRAVENOUS | Status: DC | PRN

## 2018-07-15 MED ORDER — NORTRIPTYLINE HCL 25 MG PO CAPS
25.0000 mg | ORAL_CAPSULE | Freq: Every day | ORAL | Status: DC
  Administered 2018-07-15 – 2018-07-16 (×3): 25 mg via ORAL
  Filled 2018-07-15 (×6): qty 1

## 2018-07-15 MED ORDER — POTASSIUM CHLORIDE CRYS ER 20 MEQ PO TBCR
40.0000 meq | EXTENDED_RELEASE_TABLET | Freq: Every day | ORAL | Status: DC
  Administered 2018-07-15 – 2018-07-16 (×3): 40 meq via ORAL
  Filled 2018-07-15 (×3): qty 2

## 2018-07-15 NOTE — Progress Notes (Signed)
Per HPI:  Renee Noble is a 64 y.o. female with medical history significant of chf, ckd, obesity, crf 3 liters, ckd, fibromyalgia comes in with one week of progressive worsening ble swelling and edema and 10 lbs wt gain in the last week.  No cough.  No fevers.  No sob.  No n/v/d.  Pt referred for admission for chf exacerbation.  She reports compliance with her meds at home.  Patient has been admitted with hyperglycemia as well as acute on chronic diastolic CHF.  Blood glucose appears to be improving and she is diuresing well this morning.  Will repeat labs and reassess need for further potassium and placed on SSI.  Tolerating diet.  Recheck labs in a.m. and follow clinical progress.  Patient does wear 3.5 L oxygen via nasal cannula at home.

## 2018-07-15 NOTE — H&P (Signed)
History and Physical    Renee Noble KGM:[id number] DOB: 1955-12-11 DOA: 07/14/2018  PCP: [name], PA-C  Patient coming from: home  Chief Complaint:  Swelling and wt gain  HPI: Renee Noble is a 63 y.o. female with medical history significant of chf, ckd, obesity, crf 3 liters, ckd, fibromyalgia comes in with one week of progressive worsening ble swelling and edema and 10 lbs wt gain in the last week.  No cough.  No fevers.  No sob.  No n/v/d.  Pt referred for admission for chf exacerbation.  She reports compliance with her meds at home.  Review of Systems: As per HPI otherwise 10 point review of systems negative.   Past Medical History:  Diagnosis Date  . Abdominal mass of other site   . Cervical compression fracture ([place])   . CHF (congestive heart failure) ([place])   . Chronic kidney disease, stage 3 (HCC)    Borderline Stage 2-3  . Chronic lower limb pain   . COPD (chronic obstructive pulmonary disease) ([place])   . CTS (carpal tunnel syndrome)   . Depression   . Diabetes ([place])    Type II  . Fibromyalgia   . Hepatitis C    cured last year (2018)  . Hypercholesteremia   . Hypertension   . Hypothyroidism   . Morbid obesity ([place])   . Neuropathy    Diabetes  . OSA (obstructive sleep apnea)   . Osteoarthritis   . Renal cancer ([place])    Left Kidney Removed  . RLS (restless legs syndrome)   . Syncope   . Venous stasis     Past Surgical History:  Procedure Laterality Date  . ABDOMINAL HERNIA REPAIR     x2  . ABDOMINAL HYSTERECTOMY    . BLADDER SUSPENSION    . CHOLECYSTECTOMY    . CYST EXCISION     Head  . INCISIONAL HERNIA REPAIR    . KNEE ARTHROSCOPY     Bilateral  . NEPHRECTOMY     Left  . TONSILLECTOMY    . TOOTH EXTRACTION    . UMBILICAL HERNIA REPAIR    . VAGINA SURGERY    . WISDOM TOOTH EXTRACTION       reports that she quit smoking about 1 years ago. Her smoking use included cigarettes. She has a 112.50 pack-year smoking history. She has never  used smokeless tobacco. She reports that she does not drink alcohol or use drugs.  Allergies  Allergen Reactions  . Gabapentin Anaphylaxis  . Lyrica [Pregabalin] Shortness Of Breath    Trouble breathing  . Ketorolac Tromethamine Hives  . Lisinopril Cough    Family History  Problem Relation Age of Onset  . Heart attack Mother 45       Deceased  . Heart disease Mother   . Emphysema Mother   . Alcoholism Mother   . COPD Father 77       Deceased  . Emphysema Father   . Alcoholism Father   . Esophageal varices Father   . Alcoholism Paternal Grandfather   . Diabetes Maternal Grandmother   . Heart disease Maternal Grandmother   . Lung cancer Maternal Grandfather   . Emphysema Maternal Grandfather   . Brain cancer Maternal Aunt   . Diabetes Sister   . Heart defect Sister   . Cancer Sister        was told her sister had cancer but beat it and dont know which one   . Heart  defect Sister   . Obesity Son   . Breast cancer Maternal Aunt   . Colon cancer Neg Hx   . Esophageal cancer Neg Hx     Prior to Admission medications   Medication Sig Start Date End Date Taking? Authorizing Provider  albuterol (PROAIR HFA) 108 (90 Base) MCG/ACT inhaler Inhale 1-2 puffs into the lungs every 6 (six) hours as needed for wheezing or shortness of breath. 01/20/18  Yes Tanda Rockers, MD  budesonide-formoterol (SYMBICORT) 160-4.5 MCG/ACT inhaler Inhale 2 puffs into the lungs 2 (two) times daily. 06/29/18  Yes Tanda Rockers, MD  clotrimazole-betamethasone (LOTRISONE) cream Apply 1 application topically 2 (two) times daily as needed. Patient taking differently: Apply 1 application topically 2 (two) times daily as needed (yeast).  05/02/18  Yes [name], PA-C  dextromethorphan-guaiFENesin [facility] DM) 30-600 MG 12hr tablet Take 1 tablet by mouth 2 (two) times daily as needed for cough. 06/15/18  Yes Hosie Poisson, MD  diltiazem (CARDIZEM CD) 180 MG 24 hr capsule Take 1 capsule (180 mg total) by  mouth daily. 06/02/18  Yes [name], PA-C  insulin glargine (LANTUS) 100 UNIT/ML injection INJECT 0.3MLS (30 UNITS TOTAL) INTO THE SKIN EVERY DAY Patient taking differently: Inject 30 Units into the skin every morning. INJECT 0.3MLS (30 UNITS TOTAL) INTO THE SKIN EVERY DAY 01/24/18  Yes [name], PA-C  insulin lispro (HUMALOG) 100 UNIT/ML injection Inject 0.15 mLs (15 Units total) into the skin 3 (three) times daily as needed for high blood sugar. Uses sliding scale. Sugar over 200 will give insulin. 07/14/18  Yes [name], PA-C  ipratropium-albuterol (DUONEB) 0.5-2.5 (3) MG/3ML SOLN Take 3 mLs by nebulization 4 (four) times daily. Dx: J44.9 03/21/18  Yes Parrett, [name] S, NP  levothyroxine (SYNTHROID, LEVOTHROID) 88 MCG tablet Take 1 tablet (88 mcg total) by mouth daily before breakfast. Patient taking differently: Take 88 mcg by mouth at bedtime.  12/08/17  Yes [name], PA-C  metoCLOPramide (REGLAN) 10 MG tablet Take 1 tablet (10 mg total) by mouth every 6 (six) hours as needed for nausea. Patient taking differently: Take 10 mg by mouth 2 (two) times daily.  01/17/18  Yes Cirigliano, Vito V, DO  morphine (MS CONTIN) 30 MG 12 hr tablet Take 30 mg by mouth every 12 (twelve) hours. 03/23/18  Yes [provider]  Multiple Vitamins-Minerals (CENTRUM SILVER PO) Take by mouth.   Yes [provider]  nortriptyline (PAMELOR) 25 MG capsule Take 1 capsule (25 mg total) by mouth at bedtime. 06/21/18  Yes [name], PA-C  nystatin (MYCOSTATIN/NYSTOP) powder APPLY TOPICALLY TWICE DAILY AS NEEDED FOR YEAST INFECTION Patient taking differently: Apply 1 g topically 2 (two) times daily as needed (yeast).  09/27/17  Yes [name], PA-C  OXYGEN O2 3.5L at home.   Yes [provider]  pravastatin (PRAVACHOL) 20 MG tablet TAKE ONE TABLET BY MOUTH EVERY DAY 05/05/18  Yes [name], PA-C  Probiotic Product (PROBIOTIC PO) Take by mouth.   Yes  [provider]  torsemide (DEMADEX) 20 MG tablet Take 1 tablet (20 mg total) by mouth daily. Patient taking differently: Take 40 mg by mouth daily. Take 40 mg daily for 4 days then reduce dose to 20 mg daily starting on 07/13/18 04/12/18  Yes Tat, Shanon Brow, MD  SURE COMFORT INSULIN SYRINGE 30G X 1/2" 1 ML MISC USE FOUR (4) TIMES DAILY AS DIRECTED Patient taking differently: Inject 1 Syringe into the  skin 4 (four) times daily.  08/31/16   [name], PA-C    Physical Exam: Vitals:   07/14/18 2130 07/14/18 2230 07/14/18 2300 07/15/18 0000  BP: (!) 124/58 (!) 102/57 108/62 (!) 116/52  Pulse:  99 (!) 103 100  Resp: 15 11 16 15   Temp:      TempSrc:      SpO2:   98% 98%  Weight:      Height:          Constitutional: NAD, calm, comfortable Vitals:   07/14/18 2130 07/14/18 2230 07/14/18 2300 07/15/18 0000  BP: (!) 124/58 (!) 102/57 108/62 (!) 116/52  Pulse:  99 (!) 103 100  Resp: 15 11 16 15   Temp:      TempSrc:      SpO2:   98% 98%  Weight:      Height:       Eyes: PERRL, lids and conjunctivae normal ENMT: Mucous membranes are moist. Posterior pharynx clear of any exudate or lesions.Normal dentition.  Neck: normal, supple, no masses, no thyromegaly Respiratory: clear to auscultation bilaterally, no wheezing, no crackles. Normal respiratory effort. No accessory muscle use.  Cardiovascular: Regular rate and rhythm, no murmurs / rubs / gallops. 2+ extremity edema. 2+ pedal pulses. No carotid bruits.  Abdomen: no tenderness, no masses palpated. No hepatosplenomegaly. Bowel sounds positive.  Musculoskeletal: no clubbing / cyanosis. No joint deformity upper and lower extremities. Good ROM, no contractures. Normal muscle tone.  Skin: no rashes, lesions, ulcers. No induration  browny changes to ble c/w chronic edema mild erythema Neurologic: CN 2-12 grossly intact. Sensation intact, DTR normal. Strength 5/5 in all 4.  Psychiatric: Normal judgment and insight. Alert and  oriented x 3. Normal mood.    Labs on Admission: I have personally reviewed following labs and imaging studies  CBC: Recent Labs  Lab 07/14/18 1740  WBC 4.9  NEUTROABS 3.2  HGB 10.4*  HCT 36.8  MCV 90.0  PLT 66*   Basic Metabolic Panel: Recent Labs  Lab 07/13/18 1054 07/14/18 1740  NA 141 140  K 3.4* 2.8*  CL 92* 91*  CO2 42* 39*  GLUCOSE 116* 30*  BUN 25* 26*  CREATININE 1.14 1.10*  CALCIUM 8.7 8.8*   GFR: Estimated Creatinine Clearance: 68.2 mL/min (A) (by C-G formula based on SCr of 1.1 mg/dL (H)). Liver Function Tests: No results for input(s): AST, ALT, ALKPHOS, BILITOT, PROT, ALBUMIN in the last 168 hours. No results for input(s): LIPASE, AMYLASE in the last 168 hours. No results for input(s): AMMONIA in the last 168 hours. Coagulation Profile: No results for input(s): INR, PROTIME in the last 168 hours. Cardiac Enzymes: Recent Labs  Lab 07/14/18 2018  TROPONINI 0.03*   BNP (last 3 results) No results for input(s): PROBNP in the last 8760 hours. HbA1C: No results for input(s): HGBA1C in the last 72 hours. CBG: Recent Labs  Lab 07/14/18 1745 07/14/18 1812 07/14/18 1918 07/14/18 2018 07/14/18 2147  GLUCAP 26* 62* 54* 85 147*   Lipid Profile: No results for input(s): CHOL, HDL, LDLCALC, TRIG, CHOLHDL, LDLDIRECT in the last 72 hours. Thyroid Function Tests: No results for input(s): TSH, T4TOTAL, FREET4, T3FREE, THYROIDAB in the last 72 hours. Anemia Panel: No results for input(s): VITAMINB12, FOLATE, FERRITIN, TIBC, IRON, RETICCTPCT in the last 72 hours. Urine analysis:    Component Value Date/Time   COLORURINE YELLOW 04/07/2018 1820   APPEARANCEUR HAZY (A) 04/07/2018 1820   LABSPEC 1.015 04/07/2018 1820   PHURINE 6.0 04/07/2018  [place] 04/07/2018 1820   GLUCOSEU NEGATIVE 08/17/2016 1340   HGBUR SMALL (A) 04/07/2018 [place] 04/07/2018 1820   BILIRUBINUR neg 01/15/2015 [place] 04/07/2018  1820   PROTEINUR 30 (A) 04/07/2018 1820   UROBILINOGEN 1.0 08/17/2016 1340   NITRITE NEGATIVE 04/07/2018 1820   LEUKOCYTESUR NEGATIVE 04/07/2018 1820   Sepsis Labs: !!!!!!!!!!!!!!!!!!!!!!!!!!!!!!!!!!!!!!!!!!!! @LABRCNTIP (procalcitonin:4,lacticidven:4) )No results found for this or any previous visit (from the past 240 hour(s)).   Radiological Exams on Admission: Dg Chest Port 1 View  Result Date: 07/14/2018 CLINICAL DATA:  Shortness of breath EXAM: PORTABLE CHEST 1 VIEW COMPARISON:  06/08/2018 FINDINGS: Cardiomegaly with vascular congestion. Mild peribronchial thickening. No confluent airspace opacities, effusions or overt edema. No acute bony abnormality. IMPRESSION: Cardiomegaly, mild vascular congestion.  Bronchitic changes. Electronically Signed   By: Rolm Baptise M.D.   On: 07/14/2018 18:37   Old chart reviewed Case discussed with dr Eulis Foster in the ED cxr reviewed vascular congestion noted  Assessment/Plan 63 yo female with acute on chronic chf exacerbation.  Principal Problem:   Acute on chronic diastolic CHF (congestive heart failure) ([place])- place on lasix 40mg  iv daily.  chf pathway.  No repeat of echo at this time.  afvss , oxygen sats nml on her 3 liters.  Active Problems:   Hypoglycemia due to insulin- hold all insulin products.  On d5 drip and wean. Hourly glucose cks until off drip.    COPD GOLDIII with min reversibility - stable, cont home nebs    Type II diabetes mellitus with renal manifestations ([place])- holding all insuline due to hypoglycemia    Essential hypertension- stable, cont home meds    CKD (chronic kidney disease), stage III ([place])- at baseline cr of 2    Chronic respiratory failure with hypoxia and hypercapnia (HCC)- stable on her 3 liters [place]    Morbid obesity due to excess calories ([place])- appears stable    Chronic pain syndrome- cont home meds    Fibromyalgia- noted     med rec pending pharm review   DVT prophylaxis:  scds Code Status:  full Family Communication:  none Disposition Plan:  days Consults called:  none Admission status:  admission   Elly Haffey A MD [facility]  If 7PM-7AM, please contact night-coverage [email or web address] Password [facility]  07/15/2018, 12:29 AM

## 2018-07-15 NOTE — ED Notes (Signed)
ED TO INPATIENT HANDOFF REPORT  Name/Age/Gender Renee Noble 63 y.o. female  Code Status    Code Status Orders  (From admission, onward)         Start     Ordered   07/15/18 0027  Full code  Continuous     07/15/18 0027        Code Status History    Date Active Date Inactive Code Status Order ID Comments User Context   06/09/2018 0034 06/15/2018 1836 Full Code [id number]  Ivor Costa, MD ED   04/07/2018 2041 04/11/2018 1742 Full Code [id number]  Truett Mainland, DO Inpatient   02/24/2018 2355 03/02/2018 2023 Full Code [id number]  [name], MD Inpatient   07/11/2015 1846 07/17/2015 1511 Full Code [id number]  Debbe Odea, MD ED    Advance Directive Documentation     Most Recent Value  Type of Advance Directive  Living will, Healthcare Power of Attorney  Pre-existing out of facility DNR order (yellow form or pink MOST form)  -  "MOST" Form in Place?  -      Home/SNF/Other Home  Chief Complaint Shortness of Breath / CHF  Level of Care/Admitting Diagnosis ED Disposition    ED Disposition Condition [place]: [facility] [[id number]]  Level of Care: Stepdown [14]  Diagnosis: Acute CHF (congestive heart failure) [facility]) [[id number]]  Admitting Physician: Phillips Grout [[id number]]  Attending Physician: Derrill Kay A [[id number]]  Estimated length of stay: past midnight tomorrow  Certification:: I certify this patient will need inpatient services for at least 2 midnights  PT Class (Do Not Modify): Inpatient [101]  PT Acc Code (Do Not Modify): Private [1]       Medical History Past Medical History:  Diagnosis Date  . Abdominal mass of other site   . Cervical compression fracture ([place])   . CHF (congestive heart failure) ([place])   . Chronic kidney disease, stage 3 (HCC)    Borderline Stage 2-3  . Chronic lower limb pain   . COPD (chronic obstructive pulmonary disease) ([place])   . CTS (carpal tunnel syndrome)   . Depression   . Diabetes ([place])     Type II  . Fibromyalgia   . Hepatitis C    cured last year (2018)  . Hypercholesteremia   . Hypertension   . Hypothyroidism   . Morbid obesity ([place])   . Neuropathy    Diabetes  . OSA (obstructive sleep apnea)   . Osteoarthritis   . Renal cancer ([place])    Left Kidney Removed  . RLS (restless legs syndrome)   . Syncope   . Venous stasis     Allergies Allergies  Allergen Reactions  . Gabapentin Anaphylaxis  . Lyrica [Pregabalin] Shortness Of Breath    Trouble breathing  . Ketorolac Tromethamine Hives  . Lisinopril Cough    IV Location/Drains/Wounds Patient Lines/Drains/Airways Status   Active Line/Drains/Airways    Name:   Placement date:   Placement time:   Site:   Days:   Peripheral IV 07/14/18 Left Antecubital   07/14/18    1802    Antecubital   1          Labs/Imaging Results for orders placed or performed during the hospital encounter of 07/14/18 (from the past 48 hour(s))  Brain natriuretic peptide     Status: Abnormal   Collection Time: 07/14/18  5:40 PM  Result Value Ref Range   B Natriuretic Peptide 581.0 (H) 0.0 -  100.0 pg/mL    Comment: Performed at [facility], [address], [place], [place] [address]  Basic metabolic panel     Status: Abnormal   Collection Time: 07/14/18  5:40 PM  Result Value Ref Range   Sodium 140 135 - 145 mmol/L   Potassium 2.8 (L) 3.5 - 5.1 mmol/L   Chloride 91 (L) 98 - 111 mmol/L   CO2 39 (H) 22 - 32 mmol/L   Glucose, Bld 30 (LL) 70 - 99 mg/dL    Comment: CRITICAL RESULT CALLED TO, READ BACK BY AND VERIFIED WITH: DANIELS,B ON 07/14/18 AT 1910 BY LOY,C    BUN 26 (H) 8 - 23 mg/dL   Creatinine, Ser 1.10 (H) 0.44 - 1.00 mg/dL   Calcium 8.8 (L) 8.9 - 10.3 mg/dL   GFR calc non Af Amer 53 (L) >60 mL/min   GFR calc Af Amer >60 >60 mL/min   Anion gap 10 5 - 15    Comment: Performed at [facility], [address], [place], [place] [address]  CBC with Differential     Status: Abnormal   Collection Time: 07/14/18  5:40 PM  Result  Value Ref Range   WBC 4.9 4.0 - 10.5 K/uL   RBC 4.09 3.87 - 5.11 MIL/uL   Hemoglobin 10.4 (L) 12.0 - 15.0 g/dL   HCT 36.8 36.0 - 46.0 %   MCV 90.0 80.0 - 100.0 fL   MCH 25.4 (L) 26.0 - 34.0 pg   MCHC 28.3 (L) 30.0 - 36.0 g/dL   RDW 17.2 (H) 11.5 - 15.5 %   Platelets 66 (L) 150 - 400 K/uL    Comment: SPECIMEN CHECKED FOR CLOTS   nRBC 0.0 0.0 - 0.2 %   Neutrophils Relative % 65 %   Neutro Abs 3.2 1.7 - 7.7 K/uL   Lymphocytes Relative 22 %   Lymphs Abs 1.1 0.7 - 4.0 K/uL   Monocytes Relative 12 %   Monocytes Absolute 0.6 0.1 - 1.0 K/uL   Eosinophils Relative 0 %   Eosinophils Absolute 0.0 0.0 - 0.5 K/uL   Basophils Relative 0 %   Basophils Absolute 0.0 0.0 - 0.1 K/uL   Immature Granulocytes 1 %   Abs Immature Granulocytes 0.04 0.00 - 0.07 K/uL    Comment: Performed at [facility], [address], [place], [place] [address]  Glucose, capillary     Status: Abnormal   Collection Time: 07/14/18  5:45 PM  Result Value Ref Range   Glucose-Capillary 26 (LL) 70 - 99 mg/dL   Comment 1 Notify RN    Comment 2 Document in Chart   POC CBG, ED     Status: Abnormal   Collection Time: 07/14/18  6:12 PM  Result Value Ref Range   Glucose-Capillary 62 (L) 70 - 99 mg/dL  POC CBG, ED     Status: Abnormal   Collection Time: 07/14/18  7:18 PM  Result Value Ref Range   Glucose-Capillary 54 (L) 70 - 99 mg/dL  Troponin I - Once     Status: Abnormal   Collection Time: 07/14/18  8:18 PM  Result Value Ref Range   Troponin I 0.03 (HH) <0.03 ng/mL    Comment: CRITICAL RESULT CALLED TO, READ BACK BY AND VERIFIED WITH: WALL,E ON 07/14/18 AT 2120 BY LOY,C Performed at [facility], [address] [place], [place] [address]   CBG monitoring, ED     Status: None   Collection Time: 07/14/18  8:18 PM  Result Value Ref Range  Glucose-Capillary 85 70 - 99 mg/dL  CBG monitoring, ED     Status: Abnormal   Collection Time: 07/14/18  9:47 PM  Result Value Ref Range   Glucose-Capillary 147 (H) 70 - 99 mg/dL    Dg Chest Port 1 View  Result Date: 07/14/2018 CLINICAL DATA:  Shortness of breath EXAM: PORTABLE CHEST 1 VIEW COMPARISON:  06/08/2018 FINDINGS: Cardiomegaly with vascular congestion. Mild peribronchial thickening. No confluent airspace opacities, effusions or overt edema. No acute bony abnormality. IMPRESSION: Cardiomegaly, mild vascular congestion.  Bronchitic changes. Electronically Signed   By: Rolm Baptise M.D.   On: 07/14/2018 18:37    Pending Labs Unresulted Labs (From admission, onward)    Start     Ordered   07/16/18 3546  Basic metabolic panel  Daily,   R     07/15/18 0027          Vitals/Pain Today's Vitals   07/14/18 2130 07/14/18 2230 07/14/18 2300 07/15/18 0000  BP: (!) 124/58 (!) 102/57 108/62 (!) 116/52  Pulse:  99 (!) 103 100  Resp: 15 11 16 15   Temp:      TempSrc:      SpO2:   98% 98%  Weight:      Height:      PainSc:        Isolation Precautions No active isolations  Medications Medications  dextrose 5 %-0.45 % sodium chloride infusion ( Intravenous Rate/Dose Verify 07/14/18 2239)  albuterol (PROVENTIL HFA;VENTOLIN HFA) 108 (90 Base) MCG/ACT inhaler 1-2 puff (has no administration in time range)  ipratropium-albuterol (DUONEB) 0.5-2.5 (3) MG/3ML nebulizer solution 3 mL (has no administration in time range)  morphine (MS CONTIN) 12 hr tablet 30 mg (has no administration in time range)  pravastatin (PRAVACHOL) tablet 20 mg (has no administration in time range)  diltiazem (CARDIZEM CD) 24 hr capsule 180 mg (has no administration in time range)  dextromethorphan-guaiFENesin (MUCINEX DM) 30-600 MG per 12 hr tablet 1 tablet (has no administration in time range)  nortriptyline (PAMELOR) capsule 25 mg (has no administration in time range)  sodium chloride flush (NS) 0.9 % injection 3 mL (has no administration in time range)  sodium chloride flush (NS) 0.9 % injection 3 mL (has no administration in time range)  0.9 %  sodium chloride infusion (has no  administration in time range)  acetaminophen (TYLENOL) tablet 650 mg (has no administration in time range)  ondansetron (ZOFRAN) injection 4 mg (has no administration in time range)  furosemide (LASIX) injection 40 mg (has no administration in time range)  potassium chloride SA (K-DUR,KLOR-CON) CR tablet 40 mEq (has no administration in time range)  albuterol (PROVENTIL) (2.5 MG/3ML) 0.083% nebulizer solution 5 mg (5 mg Nebulization Given 07/14/18 2023)  furosemide (LASIX) injection 40 mg (40 mg Intravenous Given 07/14/18 2009)  potassium chloride SA (K-DUR,KLOR-CON) CR tablet 40 mEq (40 mEq Oral Given 07/14/18 2007)  potassium chloride 10 mEq in 100 mL IVPB (0 mEq Intravenous Stopped 07/14/18 2318)    Mobility walks with device

## 2018-07-16 ENCOUNTER — Other Ambulatory Visit: Payer: Self-pay

## 2018-07-16 ENCOUNTER — Inpatient Hospital Stay (HOSPITAL_COMMUNITY): Payer: Medicare Other

## 2018-07-16 LAB — BASIC METABOLIC PANEL
Anion gap: 7 (ref 5–15)
BUN: 20 mg/dL (ref 8–23)
CO2: 43 mmol/L — ABNORMAL HIGH (ref 22–32)
Calcium: 8.6 mg/dL — ABNORMAL LOW (ref 8.9–10.3)
Chloride: 91 mmol/L — ABNORMAL LOW (ref 98–111)
Creatinine, Ser: 1.12 mg/dL — ABNORMAL HIGH (ref 0.44–1.00)
GFR calc non Af Amer: 52 mL/min — ABNORMAL LOW (ref >60)
Glucose, Bld: 129 mg/dL — ABNORMAL HIGH (ref 70–99)
Potassium: 3.8 mmol/L (ref 3.5–5.1)
SODIUM: 141 mmol/L (ref 135–145)

## 2018-07-16 LAB — CBC
HCT: 30.1 % — ABNORMAL LOW (ref 36.0–46.0)
Hemoglobin: 8.5 g/dL — ABNORMAL LOW (ref 12.0–15.0)
MCH: 25.3 pg — ABNORMAL LOW (ref 26.0–34.0)
MCHC: 28.2 g/dL — ABNORMAL LOW (ref 30.0–36.0)
MCV: 89.6 fL (ref 80.0–100.0)
NRBC: 0 % (ref 0.0–0.2)
Platelets: 50 10*3/uL — ABNORMAL LOW (ref 150–400)
RBC: 3.36 MIL/uL — ABNORMAL LOW (ref 3.87–5.11)
RDW: 17.6 % — ABNORMAL HIGH (ref 11.5–15.5)
WBC: 3.1 10*3/uL — ABNORMAL LOW (ref 4.0–10.5)

## 2018-07-16 LAB — FERRITIN: Ferritin: 40 ng/mL (ref 11–307)

## 2018-07-16 LAB — GLUCOSE, CAPILLARY
GLUCOSE-CAPILLARY: 164 mg/dL — AB (ref 70–99)
Glucose-Capillary: 172 mg/dL — ABNORMAL HIGH (ref 70–99)
Glucose-Capillary: 177 mg/dL — ABNORMAL HIGH (ref 70–99)

## 2018-07-16 LAB — RETICULOCYTES
IMMATURE RETIC FRACT: 27.7 % — AB (ref 2.3–15.9)
RBC.: 3.38 MIL/uL — ABNORMAL LOW (ref 3.87–5.11)
RETIC COUNT ABSOLUTE: 102.1 10*3/uL (ref 19.0–186.0)
Retic Ct Pct: 3 % (ref 0.4–3.1)

## 2018-07-16 LAB — FOLATE: Folate: 12 ng/mL (ref >5.9)

## 2018-07-16 LAB — TROPONIN I
Troponin I: <0.03 ng/mL (ref <0.03)
Troponin I: 0.03 ng/mL (ref <0.03)

## 2018-07-16 LAB — OCCULT BLOOD X 1 CARD TO LAB, STOOL: Fecal Occult Bld: NEGATIVE

## 2018-07-16 LAB — IRON AND TIBC
Iron: 33 ug/dL (ref 28–170)
Saturation Ratios: 11 % (ref 10.4–31.8)
TIBC: 294 ug/dL (ref 250–450)
UIBC: 261 ug/dL

## 2018-07-16 LAB — VITAMIN B12: Vitamin B-12: 317 pg/mL (ref 180–914)

## 2018-07-16 LAB — LACTATE DEHYDROGENASE: LDH: 102 U/L (ref 98–192)

## 2018-07-16 MED ORDER — INSULIN GLARGINE 100 UNIT/ML ~~LOC~~ SOLN
10.0000 [IU] | Freq: Every day | SUBCUTANEOUS | Status: DC
  Filled 2018-07-16 (×2): qty 0.1

## 2018-07-16 MED ORDER — INSULIN GLARGINE 100 UNIT/ML ~~LOC~~ SOLN
10.0000 [IU] | Freq: Every day | SUBCUTANEOUS | Status: DC
  Administered 2018-07-16 – 2018-07-17 (×2): 10 [IU] via SUBCUTANEOUS
  Filled 2018-07-16 (×4): qty 0.1

## 2018-07-16 MED ORDER — IPRATROPIUM-ALBUTEROL 0.5-2.5 (3) MG/3ML IN SOLN
3.0000 mL | Freq: Three times a day (TID) | RESPIRATORY_TRACT | Status: DC
  Administered 2018-07-16: 3 mL via RESPIRATORY_TRACT
  Filled 2018-07-16 (×2): qty 3

## 2018-07-16 NOTE — Progress Notes (Signed)
Trop 0.03 called by lab at 2213. Midlevel provider notified via text page at 2214.

## 2018-07-16 NOTE — Progress Notes (Signed)
PROGRESS NOTE    Renee MANERA  Noble:[id number] DOB: 06-Jan-1956 DOA: 07/14/2018 PCP: [name], PA-C   Brief Narrative:  Per HPI:  Renee Noble a 62 y.o.femalewith medical history significant ofchf, ckd, obesity, crf 3 liters, ckd, fibromyalgia comes in with one week of progressive worsening ble swelling and edema and 10 lbs wt gain in the last week. No cough. No fevers. No sob. No n/v/d. Pt referred for admission for chf exacerbation. She reports compliance with her meds at home.  Patient has been admitted with hyperglycemia as well as acute on chronic diastolic CHF.  Blood glucose appears to be improving and she is diuresing well this morning.  Will repeat labs and reassess need for further potassium and placed on SSI.  Tolerating diet.  Recheck labs in a.m. and follow clinical progress.  Patient does wear 3.5 L oxygen via nasal cannula at home.  She is noted to have some worsening anemia with no overt bleeding identified.  She is also noted to have some thrombocytopenia.  Assessment & Plan:   Principal Problem:   Acute on chronic diastolic CHF (congestive heart failure) (HCC) Active Problems:   COPD GOLDIII with min reversibility    Type II diabetes mellitus with renal manifestations (HCC)   Essential hypertension   CKD (chronic kidney disease), stage III (HCC)   Chronic respiratory failure with hypoxia and hypercapnia (HCC)   Morbid obesity due to excess calories (HCC)   Chronic pain syndrome   COPD (chronic obstructive pulmonary disease) (HCC)   Fibromyalgia   Hypoglycemia due to insulin   1. Acute on chronic diastolic heart failure in the setting of chronic hypoxemia.  Continue on Lasix for diuresis with noted almost 4 L of diuresis overnight.  She started feel somewhat better.  She usually wears 3.5 L nasal cannula at home.  Continue monitor intake and output as well as daily weights. 2. Hypoglycemia due to insulin use in the setting of renal intolerance.   Continue to hold insulin products.  This has improved and will resume insulin as tolerated. 3. Anemia with thrombocytopenia.  This has been worsening.  Will evaluate further with anemia panel, fecal occult, smear, splenic ultrasound, and LDH/haptoglobin.  Recheck CBC in a.m. 4. History of COPD with chronic hypoxemia.  Currently stable with no active bronchospasms.  Continue nebulizer treatments as needed. 5. Essential hypertension.  Stable, continue home medications. 6. CKD stage III.  Creatinine stable at 1.12 with diuresis.  Continue to monitor in AM. 7. Morbid obesity. 8. Chronic pain syndrome.  Continue home medications. 9. Fibromyalgia.   DVT prophylaxis: SCDs Code Status: Full Family Communication: None at bedside Disposition Plan: Continue diuresis as tolerated.  Anemia work-up pending.  May transfer to floor.   Consultants:   None  Procedures:   None  Antimicrobials:   None   Subjective: Patient seen and evaluated today with no new acute complaints or concerns. No acute concerns or events noted overnight.  She appears to be feeling better this morning and has diuresed quite well.  She did have a bowel movement noted yesterday that was not noted to be dark or bloody.  Objective: Vitals:   07/16/18 0500 07/16/18 0800 07/16/18 0850 07/16/18 0855  BP:      Pulse:  95    Resp:  14    Temp:  98.2 F (36.8 C)    TempSrc:  Oral    SpO2:  96% 96% 98%  Weight: 126.3 kg  Height:        Intake/Output Summary (Last 24 hours) at 07/16/2018 1053 Last data filed at 07/16/2018 0554 Gross per 24 hour  Intake 240 ml  Output 4100 ml  Net -3860 ml   Filed Weights   07/14/18 1737 07/15/18 0140 07/16/18 0500  Weight: 127 kg 127 kg 126.3 kg    Examination:  General exam: Appears calm and comfortable  Respiratory system: Clear to auscultation. Respiratory effort normal.  On 3.5 L nasal cannula. Cardiovascular system: S1 & S2 heard, RRR. No JVD, murmurs, rubs, gallops or  clicks. No pedal edema. Gastrointestinal system: Abdomen is nondistended, soft and nontender. No organomegaly or masses felt. Normal bowel sounds heard. Central nervous system: Alert and oriented. No focal neurological deficits. Extremities: Symmetric 5 x 5 power. Skin: No rashes, lesions or ulcers Psychiatry: Judgement and insight appear normal. Mood & affect appropriate.     Data Reviewed: I have personally reviewed following labs and imaging studies  CBC: Recent Labs  Lab 07/14/18 1740 07/16/18 0430  WBC 4.9 3.1*  NEUTROABS 3.2  --   HGB 10.4* 8.5*  HCT 36.8 30.1*  MCV 90.0 89.6  PLT 66* 50*   Basic Metabolic Panel: Recent Labs  Lab 07/13/18 1054 07/14/18 1740 07/15/18 1006 07/16/18 0430  NA 141 140 142 141  K 3.4* 2.8* 4.0 3.8  CL 92* 91* 93* 91*  CO2 42* 39* 42* 43*  GLUCOSE 116* 30* 163* 129*  BUN 25* 26* 21 20  CREATININE 1.14 1.10* 1.15* 1.12*  CALCIUM 8.7 8.8* 7.9* 8.6*   GFR: Estimated Creatinine Clearance: 66.7 mL/min (A) (by C-G formula based on SCr of 1.12 mg/dL (H)). Liver Function Tests: No results for input(s): AST, ALT, ALKPHOS, BILITOT, PROT, ALBUMIN in the last 168 hours. No results for input(s): LIPASE, AMYLASE in the last 168 hours. No results for input(s): AMMONIA in the last 168 hours. Coagulation Profile: No results for input(s): INR, PROTIME in the last 168 hours. Cardiac Enzymes: Recent Labs  Lab 07/14/18 2018  TROPONINI 0.03*   BNP (last 3 results) No results for input(s): PROBNP in the last 8760 hours. HbA1C: No results for input(s): HGBA1C in the last 72 hours. CBG: Recent Labs  Lab 07/15/18 0702 07/15/18 0810 07/15/18 1236 07/15/18 1615 07/15/18 2100  GLUCAP 130* 121* 165* 203* 166*   Lipid Profile: No results for input(s): CHOL, HDL, LDLCALC, TRIG, CHOLHDL, LDLDIRECT in the last 72 hours. Thyroid Function Tests: No results for input(s): TSH, T4TOTAL, FREET4, T3FREE, THYROIDAB in the last 72 hours. Anemia  Panel: Recent Labs    07/16/18 0822  VITAMINB12 317  FOLATE 12.0  FERRITIN 40  TIBC 294  IRON 33  RETICCTPCT 3.0   Sepsis Labs: No results for input(s): PROCALCITON, LATICACIDVEN in the last 168 hours.  Recent Results (from the past 240 hour(s))  MRSA PCR Screening     Status: None   Collection Time: 07/15/18  1:09 AM  Result Value Ref Range Status   MRSA by PCR NEGATIVE NEGATIVE Final    Comment:        The GeneXpert MRSA Assay (FDA approved for NASAL specimens only), is one component of a comprehensive MRSA colonization surveillance program. It is not intended to diagnose MRSA infection nor to guide or monitor treatment for MRSA infections. Performed at [facility], [address], [place],  [address]          Radiology Studies: Dg Chest [facility] 1 View  Result Date: 07/14/2018 CLINICAL DATA:  Shortness  of breath EXAM: PORTABLE CHEST 1 VIEW COMPARISON:  06/08/2018 FINDINGS: Cardiomegaly with vascular congestion. Mild peribronchial thickening. No confluent airspace opacities, effusions or overt edema. No acute bony abnormality. IMPRESSION: Cardiomegaly, mild vascular congestion.  Bronchitic changes. Electronically Signed   By: Rolm Baptise M.D.   On: 07/14/2018 18:37        Scheduled Meds: . diltiazem  180 mg Oral Daily  . furosemide  40 mg Intravenous Q12H  . insulin aspart  0-5 Units Subcutaneous QHS  . insulin aspart  0-9 Units Subcutaneous TID WC  . ipratropium-albuterol  3 mL Nebulization QID  . levothyroxine  88 mcg Oral QAC breakfast  . mometasone-formoterol  2 puff Inhalation BID  . morphine  30 mg Oral Q12H  . nortriptyline  25 mg Oral QHS  . potassium chloride  40 mEq Oral Daily  . pravastatin  20 mg Oral Daily  . senna-docusate  1 tablet Oral q morning - 10a  . sodium chloride flush  3 mL Intravenous Q12H   Continuous Infusions: . sodium chloride       LOS: 2 days    Time spent: 30 minutes    Ahmad Vanwey Darleen Crocker, DO [facility] Pager [phone]  If 7PM-7AM, please contact night-coverage [email or web address] Password [facility] 07/16/2018, 10:53 AM

## 2018-07-16 NOTE — Progress Notes (Signed)
Pt c/o of  Shooting intermittent left shoulder pain/CP 8/10. Nothing makes better/worse. She states has happened 3 times since admission. Immediately obtained EKG and notified Dr. Manuella Ghazi. EKG normal. NSR 90bmp. New orders for troponin levels and to continue to monitor obtained.

## 2018-07-17 ENCOUNTER — Ambulatory Visit: Payer: Medicare Other | Admitting: Physician Assistant

## 2018-07-17 DIAGNOSIS — L8990 Pressure ulcer of unspecified site, unspecified stage: Secondary | ICD-10-CM

## 2018-07-17 LAB — BASIC METABOLIC PANEL
Anion gap: 8 (ref 5–15)
BUN: 23 mg/dL (ref 8–23)
CALCIUM: 8.6 mg/dL — AB (ref 8.9–10.3)
CO2: 45 mmol/L — ABNORMAL HIGH (ref 22–32)
Chloride: 89 mmol/L — ABNORMAL LOW (ref 98–111)
Creatinine, Ser: 1.33 mg/dL — ABNORMAL HIGH (ref 0.44–1.00)
GFR calc Af Amer: 49 mL/min — ABNORMAL LOW (ref >60)
GFR calc non Af Amer: 42 mL/min — ABNORMAL LOW (ref >60)
Glucose, Bld: 138 mg/dL — ABNORMAL HIGH (ref 70–99)
Potassium: 4.5 mmol/L (ref 3.5–5.1)
Sodium: 142 mmol/L (ref 135–145)

## 2018-07-17 LAB — CBC
HCT: 31.2 % — ABNORMAL LOW (ref 36.0–46.0)
Hemoglobin: 8.7 g/dL — ABNORMAL LOW (ref 12.0–15.0)
MCH: 25.3 pg — AB (ref 26.0–34.0)
MCHC: 27.9 g/dL — ABNORMAL LOW (ref 30.0–36.0)
MCV: 90.7 fL (ref 80.0–100.0)
PLATELETS: 52 10*3/uL — AB (ref 150–400)
RBC: 3.44 MIL/uL — ABNORMAL LOW (ref 3.87–5.11)
RDW: 17.9 % — ABNORMAL HIGH (ref 11.5–15.5)
WBC: 3.4 10*3/uL — ABNORMAL LOW (ref 4.0–10.5)
nRBC: 0 % (ref 0.0–0.2)

## 2018-07-17 LAB — GLUCOSE, CAPILLARY: Glucose-Capillary: 124 mg/dL — ABNORMAL HIGH (ref 70–99)

## 2018-07-17 LAB — TROPONIN I: TROPONIN I: 0.03 ng/mL — AB (ref <0.03)

## 2018-07-17 LAB — HAPTOGLOBIN: Haptoglobin: 131 mg/dL (ref 37–355)

## 2018-07-17 LAB — MAGNESIUM: Magnesium: 1.5 mg/dL — ABNORMAL LOW (ref 1.7–2.4)

## 2018-07-17 MED ORDER — TORSEMIDE 20 MG PO TABS: 20.0000 mg | ORAL_TABLET | Freq: Every day | ORAL | 3 refills | Status: DC

## 2018-07-17 MED ORDER — MAGNESIUM SULFATE 2 GM/50ML IV SOLN
2.0000 g | Freq: Once | INTRAVENOUS | Status: AC
  Administered 2018-07-17: 2 g via INTRAVENOUS
  Filled 2018-07-17: qty 50

## 2018-07-17 MED ORDER — POTASSIUM CHLORIDE ER 10 MEQ PO TBCR: 10.0000 meq | EXTENDED_RELEASE_TABLET | Freq: Every day | ORAL | 3 refills | Status: DC

## 2018-07-17 MED ORDER — INSULIN GLARGINE 100 UNIT/ML ~~LOC~~ SOLN: 15.0000 [IU] | Freq: Every day | SUBCUTANEOUS | 11 refills | Status: DC

## 2018-07-17 NOTE — Discharge Summary (Signed)
Physician Discharge Summary  Renee Noble:[id number] DOB: 1956-02-20 DOA: 07/14/2018  PCP: [name], PA-C  Admit date: 07/14/2018  Discharge date: 07/17/2018  Admitted From:Home  Disposition:  Home  Recommendations for Outpatient Follow-up:  1. Follow up with PCP in 1-2 weeks 2. Repeat CBC and BMP in 1 week 3. Follow-up with cardiologist Dr. Harl Bowie in the next 2 weeks for evaluation of heart failure and follow-up 4. Follow-up with hematology Dr. Delton Coombes in the next 2 weeks for evaluation of anemia/thrombocytopenia/splenomegaly 5. Continue on 15 units of long-acting insulin at this point until blood glucose starts to elevate and stabilize. 6. Continue on Demadex 20 mg daily along with potassium supplementation as ordered.  Home Health: None  Equipment/Devices: Patient has chronic home 3.5 L nasal cannula  Discharge Condition: Stable  CODE STATUS: Full  Diet recommendation: Heart Healthy  Brief/Interim Summary: Per HPI:  Renee Noble a 63 y.o.femalewith medical history significant ofchf, ckd, obesity, crf 3 liters, ckd, fibromyalgia comes in with one week of progressive worsening ble swelling and edema and 10 lbs wt gain in the last week. No cough. No fevers. No sob. No n/v/d. Pt referred for admission for chf exacerbation. She reports compliance with her meds at home.  Patient has been admitted with hyperglycemia as well as acute on chronic diastolic CHF. Blood glucose appears to be improving and she is diuresing well this morning. Will repeat labs and reassess need for further potassium and placed on SSI. Tolerating diet. Recheck labs in a.m. and follow clinical progress. Patient does wear 3.5 L oxygen via nasal cannula at home.  She is noted to have some worsening anemia with no overt bleeding identified.  She is also noted to have some thrombocytopenia.  Patient has diuresed about 7.2 L of volume and is overall feeling much better.  Her blood  glucose levels have stabilized and she is now tolerating a diet.  Her long-acting insulin has been adjusted from 30 units to 15 units for now until further follow-up is obtained by her PCP.  She has had full anemia work-up with no iron deficiency, B12 deficiency or occult blood in the stool.  No overt bleeding identified otherwise.  Counts are remaining stable and she does have marked splenomegaly on ultrasound of her abdomen.  She will require further follow-up to hematology for bone marrow evaluation in the near future.  I have also discussed with her the need to follow-up with cardiology in the near future for her heart failure.  No other acute events noted during the course of this admission.  She is otherwise ready for discharge.  Discharge Diagnoses:  Principal Problem:   Acute on chronic diastolic CHF (congestive heart failure) (HCC) Active Problems:   COPD GOLDIII with min reversibility    Type II diabetes mellitus with renal manifestations (HCC)   Essential hypertension   CKD (chronic kidney disease), stage III (HCC)   Chronic respiratory failure with hypoxia and hypercapnia (HCC)   Morbid obesity due to excess calories (HCC)   Chronic pain syndrome   COPD (chronic obstructive pulmonary disease) (HCC)   Fibromyalgia   Hypoglycemia due to insulin   Pressure injury of skin  Principal discharge diagnosis: Acute on chronic diastolic heart failure.  Discharge Instructions  Discharge Instructions    Diet - low sodium heart healthy   Complete by:  As directed    Increase activity slowly   Complete by:  As directed      Allergies as of 07/17/2018  Reactions   Gabapentin Anaphylaxis   Lyrica [pregabalin] Shortness Of Breath   Trouble breathing   Ketorolac Tromethamine Hives   Lisinopril Cough      Medication List    TAKE these medications   albuterol 108 (90 Base) MCG/ACT inhaler Commonly known as:  PROAIR HFA Inhale 1-2 puffs into the lungs every 6 (six) hours as needed  for wheezing or shortness of breath.   budesonide-formoterol 160-4.5 MCG/ACT inhaler Commonly known as:  SYMBICORT Inhale 2 puffs into the lungs 2 (two) times daily.   CENTRUM SILVER PO Take by mouth.   clotrimazole-betamethasone cream Commonly known as:  LOTRISONE Apply 1 application topically 2 (two) times daily as needed. What changed:  reasons to take this   dextromethorphan-guaiFENesin 30-600 MG 12hr tablet Commonly known as:  MUCINEX DM Take 1 tablet by mouth 2 (two) times daily as needed for cough.   diltiazem 180 MG 24 hr capsule Commonly known as:  CARDIZEM CD Take 1 capsule (180 mg total) by mouth daily.   insulin glargine 100 UNIT/ML injection Commonly known as:  LANTUS Inject 0.15 mLs (15 Units total) into the skin daily. What changed:    how much to take  how to take this  when to take this  additional instructions   insulin lispro 100 UNIT/ML injection Commonly known as:  HUMALOG Inject 0.15 mLs (15 Units total) into the skin 3 (three) times daily as needed for high blood sugar. Uses sliding scale. Sugar over 200 will give insulin.   ipratropium-albuterol 0.5-2.5 (3) MG/3ML Soln Commonly known as:  DUONEB Take 3 mLs by nebulization 4 (four) times daily. Dx: J44.9   levothyroxine 88 MCG tablet Commonly known as:  SYNTHROID, LEVOTHROID Take 1 tablet (88 mcg total) by mouth daily before breakfast. What changed:  when to take this   metoCLOPramide 10 MG tablet Commonly known as:  REGLAN Take 1 tablet (10 mg total) by mouth every 6 (six) hours as needed for nausea. What changed:  when to take this   morphine 30 MG 12 hr tablet Commonly known as:  MS CONTIN Take 30 mg by mouth every 12 (twelve) hours.   nortriptyline 25 MG capsule Commonly known as:  PAMELOR Take 1 capsule (25 mg total) by mouth at bedtime.   nystatin powder Commonly known as:  MYCOSTATIN/NYSTOP APPLY TOPICALLY TWICE DAILY AS NEEDED FOR YEAST INFECTION What changed:  See the new  instructions.   OXYGEN O2 3.5L at home.   potassium chloride 10 MEQ tablet Commonly known as:  K-DUR Take 1 tablet (10 mEq total) by mouth daily for 30 days.   pravastatin 20 MG tablet Commonly known as:  PRAVACHOL TAKE ONE TABLET BY MOUTH EVERY DAY   PROBIOTIC PO Take by mouth.   SURE COMFORT INSULIN SYRINGE 30G X 1/2" 1 ML Misc Generic drug:  Insulin Syringe-Needle U-100 USE FOUR (4) TIMES DAILY AS DIRECTED What changed:  See the new instructions.   torsemide 20 MG tablet Commonly known as:  DEMADEX Take 1 tablet (20 mg total) by mouth daily for 30 days. What changed:    how much to take  additional instructions      Follow-up Information    [name], PA-C Follow up in 1 week(s).   Specialty:  Family Medicine Contact information: [place] [address] [place] [address] [phone]        Derek Jack, MD Follow up in 2 week(s).   Specialty:  Hematology Contact information: [phone] [place] [address] [phone]        Arnoldo Lenis, MD Follow up in 2 week(s).   Specialty:  Cardiology Contact information: [place] [address] [phone]          Allergies  Allergen Reactions  . Gabapentin Anaphylaxis  . Lyrica [Pregabalin] Shortness Of Breath    Trouble breathing  . Ketorolac Tromethamine Hives  . Lisinopril Cough    Consultations:  None   Procedures/Studies: Dg Chest Port 1 View  Result Date: 07/14/2018 CLINICAL DATA:  Shortness of breath EXAM: PORTABLE CHEST 1 VIEW COMPARISON:  06/08/2018 FINDINGS: Cardiomegaly with vascular congestion. Mild peribronchial thickening. No confluent airspace opacities, effusions or overt edema. No acute bony abnormality. IMPRESSION: Cardiomegaly, mild vascular congestion.  Bronchitic changes. Electronically Signed   By: Rolm Baptise M.D.   On: 07/14/2018 18:37   US Spleen (abdomen Limited)  Result Date: 07/16/2018 CLINICAL DATA:   Thrombocytopenia.  Evaluate spleen volume EXAM: ULTRASOUND ABDOMEN LIMITED COMPARISON:  None. FINDINGS: The spleen measures 20.3 x 20.7 x 12.1 cm. The volume is equal to 2653 mL. IMPRESSION: 1. Marked splenomegaly. Electronically Signed   By: Kerby Moors M.D.   On: 07/16/2018 13:53     Discharge Exam: Vitals:   07/17/18 0615 07/17/18 0700  BP:  126/62  Pulse:  87  Resp:  15  Temp: 98.2 F (36.8 C)   SpO2:  94%   Vitals:   07/17/18 0400 07/17/18 0500 07/17/18 0615 07/17/18 0700  BP: 112/64 (!) 117/56  126/62  Pulse: 85 86  87  Resp: 14 17  15   Temp: 98.2 F (36.8 C)  98.2 F (36.8 C)   TempSrc: Oral     SpO2: 93% 95%  94%  Weight:   125.6 kg   Height:        General: Pt is alert, awake, not in acute distress Cardiovascular: RRR, S1/S2 +, no rubs, no gallops Respiratory: CTA bilaterally, no wheezing, no rhonchi, on 3.5 L nasal cannula at baseline Abdominal: Soft, NT, ND, bowel sounds + Extremities: no edema, no cyanosis    The results of significant diagnostics from this hospitalization (including imaging, microbiology, ancillary and laboratory) are listed below for reference.     Microbiology: Recent Results (from the past 240 hour(s))  MRSA PCR Screening     Status: None   Collection Time: 07/15/18  1:09 AM  Result Value Ref Range Status   MRSA by PCR NEGATIVE NEGATIVE Final    Comment:        The GeneXpert MRSA Assay (FDA approved for NASAL specimens only), is one component of a comprehensive MRSA colonization surveillance program. It is not intended to diagnose MRSA infection nor to guide or monitor treatment for MRSA infections. Performed at [facility], [address] [place], [place] [address]      Labs: BNP (last 3 results) Recent Labs    04/07/18 1642 06/08/18 2115 07/14/18 1740  BNP 597.0* 326.6* 341.9*   Basic Metabolic Panel: Recent Labs  Lab 07/13/18 1054 07/14/18 1740 07/15/18 1006 07/16/18 0430 07/17/18 0252  NA 141 140 142  141 142  K 3.4* 2.8* 4.0 3.8 4.5  CL 92* 91* 93* 91* 89*  CO2 42* 39* 42* 43* 45*  GLUCOSE 116* 30* 163* 129* 138*  BUN 25* 26* 21 20 23   CREATININE 1.14 1.10* 1.15* 1.12* 1.33*  CALCIUM 8.7 8.8* 7.9* 8.6* 8.6*  MG  --   --   --   --  1.5*   Liver Function Tests: No results for input(s): AST, ALT, ALKPHOS, BILITOT, PROT, ALBUMIN in the last 168 hours. No results for input(s): LIPASE, AMYLASE in the last 168 hours. No results for input(s): AMMONIA in the last 168 hours. CBC: Recent Labs  Lab 07/14/18 1740 07/16/18 0430 07/17/18 0252  WBC 4.9 3.1* 3.4*  NEUTROABS 3.2  --   --   HGB 10.4* 8.5* 8.7*  HCT 36.8 30.1* 31.2*  MCV 90.0 89.6 90.7  PLT 66* 50* 52*   Cardiac Enzymes: Recent Labs  Lab 07/14/18 2018 07/16/18 1542 07/16/18 2108 07/17/18 0252  TROPONINI 0.03* <0.03 0.03* 0.03*   BNP: Invalid input(s): POCBNP CBG: Recent Labs  Lab 07/15/18 1615 07/15/18 2100 07/16/18 1119 07/16/18 1619 07/16/18 2108  GLUCAP 203* 166* 172* 177* 164*   D-Dimer No results for input(s): DDIMER in the last 72 hours. Hgb A1c No results for input(s): HGBA1C in the last 72 hours. Lipid Profile No results for input(s): CHOL, HDL, LDLCALC, TRIG, CHOLHDL, LDLDIRECT in the last 72 hours. Thyroid function studies No results for input(s): TSH, T4TOTAL, T3FREE, THYROIDAB in the last 72 hours.  Invalid input(s): FREET3 Anemia work up Recent Labs    07/16/18 0822  VITAMINB12 317  FOLATE 12.0  FERRITIN 40  TIBC 294  IRON 33  RETICCTPCT 3.0   Urinalysis    Component Value Date/Time   COLORURINE YELLOW 04/07/2018 1820   APPEARANCEUR HAZY (A) 04/07/2018 1820   LABSPEC 1.015 04/07/2018 1820   PHURINE 6.0 04/07/2018 1820   GLUCOSEU NEGATIVE 04/07/2018 1820   GLUCOSEU NEGATIVE 08/17/2016 1340   HGBUR SMALL (A) 04/07/2018 1820   BILIRUBINUR NEGATIVE 04/07/2018 1820   BILIRUBINUR neg 01/15/2015 1353   [place] 04/07/2018 1820   PROTEINUR 30 (A) 04/07/2018 1820    UROBILINOGEN 1.0 08/17/2016 1340   NITRITE NEGATIVE 04/07/2018 1820   LEUKOCYTESUR NEGATIVE 04/07/2018 1820   Sepsis Labs Invalid input(s): PROCALCITONIN,  WBC,  LACTICIDVEN Microbiology Recent Results (from the past 240 hour(s))  MRSA PCR Screening     Status: None   Collection Time: 07/15/18  1:09 AM  Result Value Ref Range Status   MRSA by PCR NEGATIVE NEGATIVE Final    Comment:        The GeneXpert MRSA Assay (FDA approved for NASAL specimens only), is one component of a comprehensive MRSA colonization surveillance program. It is not intended to diagnose MRSA infection nor to guide or monitor treatment for MRSA infections. Performed at [facility], [address] [place], [place] [address]      Time coordinating discharge: 35 minutes  SIGNED:   Rodena Goldmann, DO [facility] 07/17/2018, 8:02 AM  If 7PM-7AM, please contact night-coverage [email or web address] Password [email or web address]

## 2018-07-17 NOTE — Care Management Note (Signed)
Case Management Note  Patient Details  Name: Renee Noble MRN: [id number] Date of Birth: Apr 17, 1956  Subjective/Objective:                    Action/Plan: Patient discharging home today. Reports compliance with medications. Lives with family.  Has chronic oxygen. Declines home health.  Family is going to transport patient home today.  Bedside RN to update family contacts.  Expected Discharge Date:  07/17/18               Expected Discharge Plan:     In-House Referral:     Discharge planning Services  CM Consult  Post Acute Care Choice:  NA Choice offered to:  NA  DME Arranged:    DME Agency:     HH Arranged:    HH Agency:     Status of Service:  Completed, signed off  If discussed at [employer] of Stay Meetings, dates discussed:    Additional Comments:  Meleana Commerford, Chauncey Reading, RN 07/17/2018, 10:51 AM

## 2018-07-18 ENCOUNTER — Telehealth: Payer: Self-pay

## 2018-07-18 ENCOUNTER — Telehealth: Payer: Self-pay | Admitting: Physician Assistant

## 2018-07-18 LAB — PATHOLOGIST SMEAR REVIEW

## 2018-07-18 NOTE — Telephone Encounter (Signed)
LMOVM for Renee Noble at [facility] with orders per PCP, including our office number and fax number

## 2018-07-18 NOTE — Telephone Encounter (Signed)
Spoke with patients cousin, Renee Noble (okay per DPR).   Transition Care Management Follow-up Telephone Call  Admit date: 07/14/2018 Discharge date: 07/17/2018 Principal Problem:  Acute on chronic diastolic CHF    How have you been since you were released from the hospital? "She's pretty good". Cousin reports patients breathing has improved.    Do you understand why you were in the hospital? yes   Do you understand the discharge instructions? yes   Where were you discharged to? Home. Family with patient. Skilled nursing/PT to visit.    Items Reviewed:  Medications reviewed: no, cousin did not feel comfortable reviewing medications. Advised to bring meds to appt and review with [facility] RN at time of visit.   Allergies reviewed: yes  Dietary changes reviewed: yes  Referrals reviewed: yes   Functional Questionnaire:   Activities of Daily Living (ADLs):   She states they are independent in the following: ambulation, bathing and hygiene, feeding, continence, grooming, toileting and dressing States they require assistance with the following: None.    Any transportation issues/concerns?: no   Any patient concerns? no   Confirmed importance and date/time of follow-up visits scheduled yes  Provider Appointment booked with PCP 07/24/2018 @ 1100.   Confirmed with patient if condition begins to worsen call PCP or go to the ER.  Patient was given the office number and encouraged to call back with question or concerns.  : yes

## 2018-07-18 NOTE — Telephone Encounter (Signed)
Copied from [place] [id number]. Topic: Quick Communication - Home Health Verbal Orders >> Jul 18, 2018  9:23 AM Leward Quan A wrote: Caller/Agency: Dana Allan / [facility] Number: [phone] ok to LM Requesting OT/PT/Skilled Nursing/Social Work: skilled nursing, and wound care Frequency: new orders needed ( resume previous orders)

## 2018-07-18 NOTE — Telephone Encounter (Signed)
Ok to give verbal order and have them fax written orders to [place]

## 2018-07-19 ENCOUNTER — Other Ambulatory Visit: Payer: Self-pay | Admitting: Gastroenterology

## 2018-07-19 ENCOUNTER — Other Ambulatory Visit: Payer: Self-pay | Admitting: Physician Assistant

## 2018-07-21 ENCOUNTER — Telehealth: Payer: Self-pay | Admitting: Physician Assistant

## 2018-07-21 DIAGNOSIS — E119 Type 2 diabetes mellitus without complications: Secondary | ICD-10-CM

## 2018-07-21 DIAGNOSIS — Z794 Long term (current) use of insulin: Principal | ICD-10-CM

## 2018-07-21 NOTE — Addendum Note (Signed)
Addended by: Parke Poisson E on: 07/21/2018 03:05 PM   Modules accepted: Orders

## 2018-07-21 NOTE — Telephone Encounter (Signed)
Copied from [place] [id number]. Topic: Quick Communication - Rx Refill/Question >> Jul 21, 2018  4:53 PM Percell Belt A wrote: Medication: pt is needing a refill on testing strips ( accu chek aviva plus test strips)  pt is testing 4 times a day.  She needs a new script   Has the patient contacted their pharmacy? No. (Agent: If no, request that the patient contact the pharmacy for the refill.) (Agent: If yes, when and what did the pharmacy advise?)  Preferred Pharmacy (with phone number or street name): [facility], [place], [place] - [address] [phone] (Phone)   Agent: Please be advised that RX refills may take up to 3 business days. We ask that you follow-up with your pharmacy.

## 2018-07-22 DIAGNOSIS — G4733 Obstructive sleep apnea (adult) (pediatric): Secondary | ICD-10-CM | POA: Diagnosis not present

## 2018-07-22 DIAGNOSIS — J9622 Acute and chronic respiratory failure with hypercapnia: Secondary | ICD-10-CM | POA: Diagnosis not present

## 2018-07-22 DIAGNOSIS — E1122 Type 2 diabetes mellitus with diabetic chronic kidney disease: Secondary | ICD-10-CM | POA: Diagnosis not present

## 2018-07-22 DIAGNOSIS — I5033 Acute on chronic diastolic (congestive) heart failure: Secondary | ICD-10-CM | POA: Diagnosis not present

## 2018-07-22 DIAGNOSIS — E1140 Type 2 diabetes mellitus with diabetic neuropathy, unspecified: Secondary | ICD-10-CM | POA: Diagnosis not present

## 2018-07-22 DIAGNOSIS — G2581 Restless legs syndrome: Secondary | ICD-10-CM | POA: Diagnosis not present

## 2018-07-22 DIAGNOSIS — E1151 Type 2 diabetes mellitus with diabetic peripheral angiopathy without gangrene: Secondary | ICD-10-CM | POA: Diagnosis not present

## 2018-07-22 DIAGNOSIS — Z9981 Dependence on supplemental oxygen: Secondary | ICD-10-CM | POA: Diagnosis not present

## 2018-07-22 DIAGNOSIS — I2781 Cor pulmonale (chronic): Secondary | ICD-10-CM | POA: Diagnosis not present

## 2018-07-22 DIAGNOSIS — I471 Supraventricular tachycardia: Secondary | ICD-10-CM | POA: Diagnosis not present

## 2018-07-22 DIAGNOSIS — J9621 Acute and chronic respiratory failure with hypoxia: Secondary | ICD-10-CM | POA: Diagnosis not present

## 2018-07-22 DIAGNOSIS — N183 Chronic kidney disease, stage 3 (moderate): Secondary | ICD-10-CM | POA: Diagnosis not present

## 2018-07-22 DIAGNOSIS — G894 Chronic pain syndrome: Secondary | ICD-10-CM | POA: Diagnosis not present

## 2018-07-22 DIAGNOSIS — E1143 Type 2 diabetes mellitus with diabetic autonomic (poly)neuropathy: Secondary | ICD-10-CM | POA: Diagnosis not present

## 2018-07-22 DIAGNOSIS — K3184 Gastroparesis: Secondary | ICD-10-CM | POA: Diagnosis not present

## 2018-07-22 DIAGNOSIS — M1991 Primary osteoarthritis, unspecified site: Secondary | ICD-10-CM | POA: Diagnosis not present

## 2018-07-22 DIAGNOSIS — E039 Hypothyroidism, unspecified: Secondary | ICD-10-CM | POA: Diagnosis not present

## 2018-07-22 DIAGNOSIS — J439 Emphysema, unspecified: Secondary | ICD-10-CM | POA: Diagnosis not present

## 2018-07-22 DIAGNOSIS — I130 Hypertensive heart and chronic kidney disease with heart failure and stage 1 through stage 4 chronic kidney disease, or unspecified chronic kidney disease: Secondary | ICD-10-CM | POA: Diagnosis not present

## 2018-07-22 DIAGNOSIS — M797 Fibromyalgia: Secondary | ICD-10-CM | POA: Diagnosis not present

## 2018-07-22 DIAGNOSIS — I872 Venous insufficiency (chronic) (peripheral): Secondary | ICD-10-CM | POA: Diagnosis not present

## 2018-07-24 ENCOUNTER — Telehealth: Payer: Self-pay | Admitting: Physician Assistant

## 2018-07-24 ENCOUNTER — Inpatient Hospital Stay: Payer: Medicare Other | Admitting: Physician Assistant

## 2018-07-24 ENCOUNTER — Other Ambulatory Visit: Payer: Self-pay | Admitting: *Deleted

## 2018-07-24 MED ORDER — GLUCOSE BLOOD VI STRP: ORAL_STRIP | 12 refills | Status: AC

## 2018-07-24 NOTE — Telephone Encounter (Signed)
Copied from [place] [id number]. Topic: Quick Communication - See Telephone Encounter >> Jul 24, 2018 10:13 AM Robina Ade, Helene Kelp D wrote: CRM for notification. See Telephone encounter for: 07/24/18. Pam with [facility] called requesting orders for skill nurse for 1X week for 1 week, 2X week for 4 weeks and 2 PRN as needed. She is also requesting Home health Aid and frequency of 2X week for 3 weeks, 1X week for 1 week, and PT for evaluate and treat. Pt has venous ulcer due to generalize edema. And Pam would like to address this with foam dressing and skin cream.  If any questions please call [phone] and speak with Melissa or Kera.

## 2018-07-24 NOTE — Telephone Encounter (Signed)
Ok for verbal orders ?

## 2018-07-24 NOTE — Patient Outreach (Signed)
Renee Noble) Care Management  07/24/2018  Renee Noble Oct 09, 1955 [id number]   EMMI-general discharge  RED ON [place] Day # 4 Date: 07/22/18 Saturday 1015 Red Alert Reason: Lost interest in things? Yes Insurance: [employer] medicare  [facility] admissions x 4 ED visits x 4 in the last 6 months    Outreach attempt # 1 successful to the home number  Patient is able to verify HIPAA [place] Management RN reviewed and addressed red alert with patient  EMMI  Renee Noble reports she is doing okay She denies loss of interest She reports the answer to EMMI was incorrect    Social: Renee Noble lives with her cousin She denies issues with transportation.   The patient is independent with some assist with ADLs, iADLs and transportation    Conditions: Acute hypoxia, HTN, CHF, COPD, PNA, DM insulin dependent , CKD, hernia, HDL, morbid obesity, OSA , chronic pain syndrome cor pulmonale (chronic)    Medications: denies concerns with taking medications as prescribed, affording medications, side effects of medications and questions about medications   Appointments: 07/31/18 with primary MD Dr Hassell Done   Advance Directives:  has a POA    Consent: [facility] RN CM reviewed Renee Noble services with patient. Patient gave verbal consent for services.   Advised patient that there will be further automated EMMI- post discharge calls to assess how the patient is doing following the recent hospitalization Advised the patient that another call may be received from a nurse if any of their responses were abnormal. Patient voiced understanding and was appreciative of f/u call.   Plan: [facility] RN CM will close case at this time as patient has been assessed and no needs identified/needs resolved.   Pt encouraged to return a call to Renee Noble CM prn  [facility] RN CM sent a successful outreach letter as discussed with [facility] brochure enclosed for review   Renee Noble L. Renee Hamman, RN, BSN, [place] Coordinator Office number [phone] Mobile number [phone]  Main [facility] number [phone] Fax number [phone]

## 2018-07-24 NOTE — Telephone Encounter (Signed)
Accu check aviva test strips sent to the [place] for checking 4 times daily due to insulin use.

## 2018-07-24 NOTE — Telephone Encounter (Signed)
Attempted to call - VM was not secure so I did not leave any patient information.  Asked that Renee Noble return my call.

## 2018-07-26 DIAGNOSIS — M542 Cervicalgia: Secondary | ICD-10-CM | POA: Diagnosis not present

## 2018-07-26 DIAGNOSIS — Z79899 Other long term (current) drug therapy: Secondary | ICD-10-CM | POA: Diagnosis not present

## 2018-07-26 DIAGNOSIS — M47817 Spondylosis without myelopathy or radiculopathy, lumbosacral region: Secondary | ICD-10-CM | POA: Diagnosis not present

## 2018-07-26 DIAGNOSIS — Z79891 Long term (current) use of opiate analgesic: Secondary | ICD-10-CM | POA: Diagnosis not present

## 2018-07-26 DIAGNOSIS — M2391 Unspecified internal derangement of right knee: Secondary | ICD-10-CM | POA: Diagnosis not present

## 2018-07-26 DIAGNOSIS — G894 Chronic pain syndrome: Secondary | ICD-10-CM | POA: Diagnosis not present

## 2018-07-28 ENCOUNTER — Inpatient Hospital Stay: Payer: Medicare Other | Admitting: Physician Assistant

## 2018-07-31 ENCOUNTER — Ambulatory Visit (INDEPENDENT_AMBULATORY_CARE_PROVIDER_SITE_OTHER): Payer: Medicare Other | Admitting: Physician Assistant

## 2018-07-31 ENCOUNTER — Encounter: Payer: Self-pay | Admitting: Physician Assistant

## 2018-07-31 ENCOUNTER — Other Ambulatory Visit: Payer: Self-pay

## 2018-07-31 VITALS — BP 110/60 | HR 88 | Temp 98.2°F | Resp 16 | Ht 64.0 in | Wt 277.0 lb

## 2018-07-31 DIAGNOSIS — D696 Thrombocytopenia, unspecified: Secondary | ICD-10-CM

## 2018-07-31 DIAGNOSIS — G4733 Obstructive sleep apnea (adult) (pediatric): Secondary | ICD-10-CM | POA: Diagnosis not present

## 2018-07-31 DIAGNOSIS — J9611 Chronic respiratory failure with hypoxia: Secondary | ICD-10-CM | POA: Diagnosis not present

## 2018-07-31 DIAGNOSIS — N183 Chronic kidney disease, stage 3 unspecified: Secondary | ICD-10-CM

## 2018-07-31 DIAGNOSIS — I5033 Acute on chronic diastolic (congestive) heart failure: Secondary | ICD-10-CM | POA: Diagnosis not present

## 2018-07-31 DIAGNOSIS — J449 Chronic obstructive pulmonary disease, unspecified: Secondary | ICD-10-CM

## 2018-07-31 DIAGNOSIS — L89322 Pressure ulcer of left buttock, stage 2: Secondary | ICD-10-CM

## 2018-07-31 DIAGNOSIS — R161 Splenomegaly, not elsewhere classified: Secondary | ICD-10-CM

## 2018-07-31 DIAGNOSIS — J9612 Chronic respiratory failure with hypercapnia: Secondary | ICD-10-CM

## 2018-07-31 LAB — CBC WITH DIFFERENTIAL/PLATELET
Basophils Absolute: 0 10*3/uL (ref 0.0–0.1)
Basophils Relative: 0.4 % (ref 0.0–3.0)
Eosinophils Absolute: 0.1 10*3/uL (ref 0.0–0.7)
Eosinophils Relative: 1.8 % (ref 0.0–5.0)
HCT: 31.9 % — ABNORMAL LOW (ref 36.0–46.0)
Hemoglobin: 10 g/dL — ABNORMAL LOW (ref 12.0–15.0)
LYMPHS ABS: 0.6 10*3/uL — AB (ref 0.7–4.0)
Lymphocytes Relative: 14.4 % (ref 12.0–46.0)
MCHC: 31.3 g/dL (ref 30.0–36.0)
MCV: 82.6 fl (ref 78.0–100.0)
MONOS PCT: 11.6 % (ref 3.0–12.0)
Monocytes Absolute: 0.5 10*3/uL (ref 0.1–1.0)
NEUTROS PCT: 71.8 % (ref 43.0–77.0)
Neutro Abs: 3.1 10*3/uL (ref 1.4–7.7)
Platelets: 74 10*3/uL — ABNORMAL LOW (ref 150.0–400.0)
RBC: 3.86 Mil/uL — ABNORMAL LOW (ref 3.87–5.11)
RDW: 19.1 % — ABNORMAL HIGH (ref 11.5–15.5)
WBC: 4.3 10*3/uL (ref 4.0–10.5)

## 2018-07-31 LAB — BASIC METABOLIC PANEL
BUN: 20 mg/dL (ref 6–23)
CO2: 45 mEq/L — ABNORMAL HIGH (ref 19–32)
Calcium: 9 mg/dL (ref 8.4–10.5)
Chloride: 91 mEq/L — ABNORMAL LOW (ref 96–112)
Creatinine, Ser: 1.17 mg/dL (ref 0.40–1.20)
GFR: 46.42 mL/min — ABNORMAL LOW (ref >60.00)
Glucose, Bld: 72 mg/dL (ref 70–99)
Potassium: 4 mEq/L (ref 3.5–5.1)
Sodium: 141 mEq/L (ref 135–145)

## 2018-07-31 NOTE — Progress Notes (Signed)
Patient presents to clinic today for hospitalization follow-up/TCM visit. Patient presented to ER on 2.28.2020 with c/o worsening peripheral edema, SOB. ER workup included labs revealing borderline elevated troponin at 0.3, acute on chronic CKD, elevated BNP and hypoglycemia. Was given IV hydration with D5W. Admitted to hospital for further assessment and management for suspected acute CHF.  During hospitalization, patient was diuresed 7.2 L during hospitalization. Had full anemia workup revealing no iron deficiency, b12 deficiency or occult blood. Marked splenomegaly note on abdominal US performed. She was discharged home on home medications, to follow-up with Cardiology ASAP and with Hematology for bone marrow evaluation due to anemia and splenomegaly.   Since discharge, patient endorses doing very well. Notes breathing is good and weight has remained stable. Denies any increase in peripheral edema. Denies chest pain, SOB. Is hydrating well and eating well. Notes fasting glucose 100-120. Has follow-up scheduled with Hematology and Cardiology. Does note a sore of left posterior upper thigh and buttock. Is unsure if this is from yeast rash or something else. HH RN is doing wound care for chronic venous ulcer of posterior RLE. Has not looked at new area of concern.  Past Medical History:  Diagnosis Date  . Abdominal mass of other site   . Cervical compression fracture ([place])   . CHF (congestive heart failure) ([place])   . Chronic kidney disease, stage 3 (HCC)    Borderline Stage 2-3  . Chronic lower limb pain   . COPD (chronic obstructive pulmonary disease) ([place])   . CTS (carpal tunnel syndrome)   . Depression   . Diabetes ([place])    Type II  . Fibromyalgia   . Hepatitis C    cured last year (2018)  . Hypercholesteremia   . Hypertension   . Hypothyroidism   . Morbid obesity ([place])   . Neuropathy    Diabetes  . OSA (obstructive sleep apnea)   . Osteoarthritis   . Renal cancer ([place])    Left  Kidney Removed  . RLS (restless legs syndrome)   . Syncope   . Venous stasis     Current Outpatient Medications on File Prior to Visit  Medication Sig Dispense Refill  . albuterol (PROAIR HFA) 108 (90 Base) MCG/ACT inhaler Inhale 1-2 puffs into the lungs every 6 (six) hours as needed for wheezing or shortness of breath. 1 Inhaler 5  . budesonide-formoterol (SYMBICORT) 160-4.5 MCG/ACT inhaler Inhale 2 puffs into the lungs 2 (two) times daily. 1 Inhaler 11  . clotrimazole-betamethasone (LOTRISONE) cream Apply 1 application topically 2 (two) times daily as needed. (Patient taking differently: Apply 1 application topically 2 (two) times daily as needed (yeast). ) 45 g 0  . dextromethorphan-guaiFENesin (MUCINEX DM) 30-600 MG 12hr tablet Take 1 tablet by mouth 2 (two) times daily as needed for cough. 20 tablet 0  . diltiazem (CARDIZEM CD) 180 MG 24 hr capsule Take 1 capsule (180 mg total) by mouth daily. 90 capsule 1  . glucose blood (ACCU-CHEK AVIVA) test strip Check blood sugars 4 times per day for diabetes. Dx:E11.29 100 each 12  . insulin glargine (LANTUS) 100 UNIT/ML injection Inject 0.15 mLs (15 Units total) into the skin daily. 10 mL 11  . insulin lispro (HUMALOG) 100 UNIT/ML injection Inject 0.15 mLs (15 Units total) into the skin 3 (three) times daily as needed for high blood sugar. Uses sliding scale. Sugar over 200 will give insulin. 10 mL 1  . ipratropium-albuterol (DUONEB) 0.5-2.5 (3) MG/3ML SOLN Take 3 mLs by nebulization  4 (four) times daily. Dx: J44.9 360 mL 5  . levothyroxine (SYNTHROID, LEVOTHROID) 88 MCG tablet TAKE ONE TABLET BY MOUTH DAILY 90 tablet 1  . metoCLOPramide (REGLAN) 10 MG tablet Take 1 tablet (10 mg total) by mouth every 6 (six) hours as needed for nausea. (Patient taking differently: Take 10 mg by mouth 2 (two) times daily. ) 120 tablet 1  . morphine (MS CONTIN) 30 MG 12 hr tablet Take 30 mg by mouth every 12 (twelve) hours.  0  . Multiple Vitamins-Minerals (CENTRUM  SILVER PO) Take by mouth.    . nortriptyline (PAMELOR) 25 MG capsule Take 1 capsule (25 mg total) by mouth at bedtime. 90 capsule 1  . nystatin (MYCOSTATIN/NYSTOP) powder APPLY TOPICALLY TWICE DAILY AS NEEDED FOR YEAST INFECTION (Patient taking differently: Apply 1 g topically 2 (two) times daily as needed (yeast). ) 15 g 2  . OXYGEN O2 3.5L at home.    . potassium chloride (K-DUR) 10 MEQ tablet Take 1 tablet (10 mEq total) by mouth daily for 30 days. 30 tablet 3  . pravastatin (PRAVACHOL) 20 MG tablet TAKE ONE TABLET BY MOUTH EVERY DAY 90 tablet 0  . Probiotic Product (PROBIOTIC PO) Take by mouth.    Haig Prophet COMFORT INSULIN SYRINGE 30G X 1/2" 1 ML MISC USE FOUR (4) TIMES DAILY AS DIRECTED (Patient taking differently: Inject 1 Syringe into the skin 4 (four) times daily. ) 100 each 3  . torsemide (DEMADEX) 20 MG tablet Take 1 tablet (20 mg total) by mouth daily for 30 days. 30 tablet 3   No current facility-administered medications on file prior to visit.     Allergies  Allergen Reactions  . Gabapentin Anaphylaxis  . Lyrica [Pregabalin] Shortness Of Breath    Trouble breathing  . Ketorolac Tromethamine Hives  . Lisinopril Cough    Family History  Problem Relation Age of Onset  . Heart attack Mother 80       Deceased  . Heart disease Mother   . Emphysema Mother   . Alcoholism Mother   . COPD Father 45       Deceased  . Emphysema Father   . Alcoholism Father   . Esophageal varices Father   . Alcoholism Paternal Grandfather   . Diabetes Maternal Grandmother   . Heart disease Maternal Grandmother   . Lung cancer Maternal Grandfather   . Emphysema Maternal Grandfather   . Brain cancer Maternal Aunt   . Diabetes Sister   . Heart defect Sister   . Cancer Sister        was told her sister had cancer but beat it and dont know which one   . Heart defect Sister   . Obesity Son   . Breast cancer Maternal Aunt   . Colon cancer Neg Hx   . Esophageal cancer Neg Hx     Social History    Socioeconomic History  . Marital status: Widowed    Spouse name: Not on file  . Number of children: 1  . Years of education: Not on file  . Highest education level: Not on file  Occupational History  . Occupation: Retired  Scientific laboratory technician  . Financial resource strain: Not on file  . Food insecurity:    Worry: Not on file    Inability: Not on file  . Transportation needs:    Medical: Not on file    Non-medical: Not on file  Tobacco Use  . Smoking status: Former Smoker  Packs/day: 2.50    Years: 45.00    Pack years: 112.50    Types: Cigarettes    Last attempt to quit: 07/21/2016    Years since quitting: 2.0  . Smokeless tobacco: Never Used  Substance and Sexual Activity  . Alcohol use: No  . Drug use: No  . Sexual activity: Never  Lifestyle  . Physical activity:    Days per week: Not on file    Minutes per session: Not on file  . Stress: Not on file  Relationships  . Social connections:    Talks on phone: Not on file    Gets together: Not on file    Attends religious service: Not on file    Active member of club or organization: Not on file    Attends meetings of clubs or organizations: Not on file    Relationship status: Not on file  Other Topics Concern  . Not on file  Social History Narrative  . Not on file   Review of Systems - See HPI.  All other ROS are negative.  BP 110/60   Pulse 88   Temp 98.2 F (36.8 C) (Oral)   Resp 16   Ht 5\' 4"  (1.626 m)   Wt 277 lb (125.6 kg)   SpO2 93% Comment: 3L  BMI 47.55 kg/m   Physical Exam Vitals signs reviewed.  Constitutional:      Appearance: Normal appearance.  HENT:     Head: Normocephalic and atraumatic.     Right Ear: Tympanic membrane normal.     Left Ear: Tympanic membrane normal.     Nose: Nose normal.     Mouth/Throat:     Mouth: Mucous membranes are moist.  Eyes:     Conjunctiva/sclera: Conjunctivae normal.     Pupils: Pupils are equal, round, and reactive to light.  Neck:     Musculoskeletal:  Neck supple.  Cardiovascular:     Rate and Rhythm: Normal rate and regular rhythm.     Heart sounds: Normal heart sounds.  Pulmonary:     Effort: Pulmonary effort is normal.  Skin:      Neurological:     General: No focal deficit present.     Mental Status: She is alert and oriented to person, place, and time.  Psychiatric:        Mood and Affect: Mood normal.    Recent Results (from the past 2160 hour(s))  Basic metabolic panel     Status: Abnormal   Collection Time: 06/08/18  9:15 PM  Result Value Ref Range   Sodium 139 135 - 145 mmol/L   Potassium 4.2 3.5 - 5.1 mmol/L   Chloride 94 (L) 98 - 111 mmol/L   CO2 38 (H) 22 - 32 mmol/L   Glucose, Bld 121 (H) 70 - 99 mg/dL   BUN 29 (H) 8 - 23 mg/dL   Creatinine, Ser 1.57 (H) 0.44 - 1.00 mg/dL   Calcium 8.9 8.9 - 10.3 mg/dL   GFR calc non Af Amer 34 (L) >60 mL/min   GFR calc Af Amer 40 (L) >60 mL/min   Anion gap 7 5 - 15    Comment: Performed at [facility], [place] [address] [place], [place] [address]  Brain natriuretic peptide     Status: Abnormal   Collection Time: 06/08/18  9:15 PM  Result Value Ref Range   B Natriuretic Peptide 326.6 (H) 0.0 - 100.0 pg/mL    Comment: Performed at [facility], [place] [address], [place], [place] [address]  I-stat troponin, ED     Status: None   Collection Time: 06/08/18  9:54 PM  Result Value Ref Range   Troponin i, poc 0.01 0.00 - 0.08 ng/mL   Comment 3            Comment: Due to the release kinetics of cTnI, a negative result within the first hours of the onset of symptoms does not rule out myocardial infarction with certainty. If myocardial infarction is still suspected, repeat the test at appropriate intervals.   CBC     Status: Abnormal   Collection Time: 06/09/18  4:38 AM  Result Value Ref Range   WBC 4.4 4.0 - 10.5 K/uL   RBC 3.51 (L) 3.87 - 5.11 MIL/uL   Hemoglobin 9.2 (L) 12.0 - 15.0 g/dL   HCT 32.1 (L) 36.0 - 46.0 %   MCV 91.5 80.0 -  100.0 fL   MCH 26.2 26.0 - 34.0 pg   MCHC 28.7 (L) 30.0 - 36.0 g/dL   RDW 17.6 (H) 11.5 - 15.5 %   Platelets 54 (L) 150 - 400 K/uL    Comment: Immature Platelet Fraction may be clinically indicated, consider ordering this additional test [id number] CONSISTENT WITH PREVIOUS RESULT    nRBC 0.0 0.0 - 0.2 %    Comment: Performed at [facility], [place] [address], [place], [place] [address]  HIV antibody     Status: None   Collection Time: 06/09/18  4:38 AM  Result Value Ref Range   HIV Screen 4th Generation wRfx Non Reactive Non Reactive    Comment: (NOTE) Performed At: [facility] [address] [place], [place] [id number] Rush Farmer MD QI:[id number]   Culture, blood (routine x 2) Call MD if unable to obtain prior to antibiotics being given     Status: None   Collection Time: 06/09/18  4:38 AM  Result Value Ref Range   Specimen Description      BLOOD RIGHT HAND Performed at [place] [address] [place], [place] [address]    Special Requests      BOTTLES DRAWN AEROBIC AND ANAEROBIC Blood Culture adequate volume Performed at [place] [address] [place], [place] [address]    Culture      NO GROWTH 5 DAYS Performed at [facility], [place] [address] [place], [place] [address]    Report Status 06/14/2018 FINAL   Culture, blood (routine x 2) Call MD if unable to obtain prior to antibiotics being given     Status: None   Collection Time: 06/09/18  5:03 AM  Result Value Ref Range   Specimen Description      BLOOD BLOOD RIGHT FOREARM Performed at [place] [address] [place], [place] [address]    Special Requests      BOTTLES DRAWN AEROBIC AND ANAEROBIC Blood Culture results may not be optimal due to an excessive volume of blood received in culture bottles Performed at [place] [address], [place], [place] [address]    Culture      NO  GROWTH 5 DAYS Performed at [facility], [place] [address], [place], [place] [address]    Report Status 06/14/2018 FINAL   CBG monitoring, ED     Status: Abnormal   Collection Time: 06/09/18  8:39 AM  Result Value Ref Range   Glucose-Capillary 214 (H) 70 - 99 mg/dL  Influenza panel by PCR (type A & B)     Status: None   Collection Time: 06/09/18  8:40 AM  Result Value Ref Range   Influenza A By PCR NEGATIVE NEGATIVE   Influenza B By PCR NEGATIVE NEGATIVE    Comment: (NOTE) The Xpert Xpress Flu assay is intended as an aid in the diagnosis of  influenza and should not be used as a sole basis for treatment.  This  assay is FDA approved for nasopharyngeal swab specimens only. Nasal  washings and aspirates are unacceptable for Xpert Xpress Flu testing. Performed at [facility], [place] [address], [place], [place] [address]   CBG monitoring, ED     Status: None   Collection Time: 06/09/18 11:44 AM  Result Value Ref Range   Glucose-Capillary 88 70 - 99 mg/dL  Glucose, capillary     Status: Abnormal   Collection Time: 06/09/18  4:48 PM  Result Value Ref Range   Glucose-Capillary 236 (H) 70 - 99 mg/dL  Basic metabolic panel     Status: Abnormal   Collection Time: 06/09/18  7:08 PM  Result Value Ref Range   Sodium 137 135 - 145 mmol/L   Potassium 4.1 3.5 - 5.1 mmol/L   Chloride 90 (L) 98 - 111 mmol/L   CO2 35 (H) 22 - 32 mmol/L   Glucose, Bld 270 (H) 70 - 99 mg/dL   BUN 36 (H) 8 - 23 mg/dL   Creatinine, Ser 1.40 (H) 0.44 - 1.00 mg/dL   Calcium 8.7 (L) 8.9 - 10.3 mg/dL   GFR calc non Af Amer 40 (L) >60 mL/min   GFR calc Af Amer 46 (L) >60 mL/min   Anion gap 12 5 - 15    Comment: Performed at [facility], [place] [address], [place], [place] [address]  Glucose, capillary     Status: Abnormal   Collection Time: 06/09/18  8:56 PM  Result Value Ref Range   Glucose-Capillary 282 (H) 70 - 99 mg/dL  Basic metabolic panel     Status: Abnormal    Collection Time: 06/10/18  7:29 AM  Result Value Ref Range   Sodium 135 135 - 145 mmol/L   Potassium 4.4 3.5 - 5.1 mmol/L   Chloride 89 (L) 98 - 111 mmol/L   CO2 36 (H) 22 - 32 mmol/L   Glucose, Bld 199 (H) 70 - 99 mg/dL   BUN 38 (H) 8 - 23 mg/dL   Creatinine, Ser 1.24 (H) 0.44 - 1.00 mg/dL   Calcium 9.1 8.9 - 10.3 mg/dL   GFR calc non Af Amer 46 (L) >60 mL/min   GFR calc Af Amer 53 (L) >60 mL/min   Anion gap 10 5 - 15    Comment: Performed at [facility], [place] [address] [place], [place] [address]  CBC     Status: Abnormal   Collection Time: 06/10/18  7:29 AM  Result Value Ref Range   WBC 4.6 4.0 - 10.5 K/uL   RBC 3.71 (L) 3.87 - 5.11 MIL/uL   Hemoglobin 9.8 (L) 12.0 - 15.0 g/dL   HCT 33.2 (L) 36.0 - 46.0 %   MCV 89.5 80.0 - 100.0 fL   MCH 26.4 26.0 - 34.0 pg   MCHC 29.5 (L) 30.0 - 36.0 g/dL   RDW 17.2 (H) 11.5 - 15.5 %   Platelets 64 (L) 150 - 400 K/uL    Comment: REPEATED TO VERIFY Immature Platelet Fraction may be clinically indicated,  consider ordering this additional test [id number] CONSISTENT WITH PREVIOUS RESULT    nRBC 0.0 0.0 - 0.2 %    Comment: Performed at [facility], [place] [address], [place], [place] [address]  Glucose, capillary     Status: Abnormal   Collection Time: 06/10/18  7:37 AM  Result Value Ref Range   Glucose-Capillary 213 (H) 70 - 99 mg/dL  Glucose, capillary     Status: Abnormal   Collection Time: 06/10/18 12:02 PM  Result Value Ref Range   Glucose-Capillary 216 (H) 70 - 99 mg/dL  Glucose, capillary     Status: Abnormal   Collection Time: 06/10/18  4:15 PM  Result Value Ref Range   Glucose-Capillary 256 (H) 70 - 99 mg/dL  Basic metabolic panel     Status: Abnormal   Collection Time: 06/10/18  8:58 PM  Result Value Ref Range   Sodium 135 135 - 145 mmol/L   Potassium 3.6 3.5 - 5.1 mmol/L    Comment: DELTA CHECK NOTED   Chloride 87 (L) 98 - 111 mmol/L   CO2 35 (H) 22 - 32 mmol/L   Glucose, Bld 219 (H)  70 - 99 mg/dL   BUN 49 (H) 8 - 23 mg/dL   Creatinine, Ser 1.28 (H) 0.44 - 1.00 mg/dL   Calcium 9.2 8.9 - 10.3 mg/dL   GFR calc non Af Amer 44 (L) >60 mL/min   GFR calc Af Amer 51 (L) >60 mL/min   Anion gap 13 5 - 15    Comment: Performed at [facility], [place] [address], [place], [place] [address]  Magnesium     Status: None   Collection Time: 06/10/18  8:58 PM  Result Value Ref Range   Magnesium 2.0 1.7 - 2.4 mg/dL    Comment: Performed at [facility], [place] [address] [place], [place] [address]  Glucose, capillary     Status: Abnormal   Collection Time: 06/10/18  9:42 PM  Result Value Ref Range   Glucose-Capillary 207 (H) 70 - 99 mg/dL  CBC     Status: Abnormal   Collection Time: 06/11/18  4:24 AM  Result Value Ref Range   WBC 4.7 4.0 - 10.5 K/uL   RBC 4.01 3.87 - 5.11 MIL/uL   Hemoglobin 10.5 (L) 12.0 - 15.0 g/dL   HCT 35.1 (L) 36.0 - 46.0 %   MCV 87.5 80.0 - 100.0 fL   MCH 26.2 26.0 - 34.0 pg   MCHC 29.9 (L) 30.0 - 36.0 g/dL   RDW 17.1 (H) 11.5 - 15.5 %   Platelets 67 (L) 150 - 400 K/uL    Comment: REPEATED TO VERIFY Immature Platelet Fraction may be clinically indicated, consider ordering this additional test [id number] CONSISTENT WITH PREVIOUS RESULT    nRBC 0.0 0.0 - 0.2 %    Comment: Performed at [facility], [place] [address], [place], [place] [address]  Basic metabolic panel     Status: Abnormal   Collection Time: 06/11/18  4:24 AM  Result Value Ref Range   Sodium 135 135 - 145 mmol/L   Potassium 3.8 3.5 - 5.1 mmol/L   Chloride 89 (L) 98 - 111 mmol/L   CO2 33 (H) 22 - 32 mmol/L   Glucose, Bld 215 (H) 70 - 99 mg/dL   BUN 49 (H) 8 - 23 mg/dL   Creatinine, Ser 1.20 (H) 0.44 - 1.00 mg/dL   Calcium 8.9 8.9 - 10.3 mg/dL   GFR calc non  Af Amer 48 (L) >60 mL/min   GFR calc Af Amer 55 (L) >60 mL/min   Anion gap 13 5 - 15    Comment: Performed at [facility], [place] [address] [place],  [place] [address]  Glucose, capillary     Status: Abnormal   Collection Time: 06/11/18  7:29 AM  Result Value Ref Range   Glucose-Capillary 215 (H) 70 - 99 mg/dL  Glucose, capillary     Status: Abnormal   Collection Time: 06/11/18 12:34 PM  Result Value Ref Range   Glucose-Capillary 220 (H) 70 - 99 mg/dL  Glucose, capillary     Status: Abnormal   Collection Time: 06/11/18  4:06 PM  Result Value Ref Range   Glucose-Capillary 247 (H) 70 - 99 mg/dL  Glucose, capillary     Status: Abnormal   Collection Time: 06/11/18  9:53 PM  Result Value Ref Range   Glucose-Capillary 242 (H) 70 - 99 mg/dL  CBC     Status: Abnormal   Collection Time: 06/12/18  4:10 AM  Result Value Ref Range   WBC 4.3 4.0 - 10.5 K/uL   RBC 4.07 3.87 - 5.11 MIL/uL   Hemoglobin 10.5 (L) 12.0 - 15.0 g/dL   HCT 35.7 (L) 36.0 - 46.0 %   MCV 87.7 80.0 - 100.0 fL   MCH 25.8 (L) 26.0 - 34.0 pg   MCHC 29.4 (L) 30.0 - 36.0 g/dL   RDW 16.7 (H) 11.5 - 15.5 %   Platelets 70 (L) 150 - 400 K/uL    Comment: REPEATED TO VERIFY Immature Platelet Fraction may be clinically indicated, consider ordering this additional test [id number] CONSISTENT WITH PREVIOUS RESULT    nRBC 0.0 0.0 - 0.2 %    Comment: Performed at [facility], [place] [address], [place], [place] [address]  Basic metabolic panel     Status: Abnormal   Collection Time: 06/12/18  4:10 AM  Result Value Ref Range   Sodium 135 135 - 145 mmol/L   Potassium 4.1 3.5 - 5.1 mmol/L   Chloride 89 (L) 98 - 111 mmol/L   CO2 35 (H) 22 - 32 mmol/L   Glucose, Bld 220 (H) 70 - 99 mg/dL   BUN 60 (H) 8 - 23 mg/dL   Creatinine, Ser 1.46 (H) 0.44 - 1.00 mg/dL   Calcium 8.6 (L) 8.9 - 10.3 mg/dL   GFR calc non Af Amer 38 (L) >60 mL/min   GFR calc Af Amer 44 (L) >60 mL/min   Anion gap 11 5 - 15    Comment: Performed at [facility], [place] [address] [place], [place] [address]  Hemoglobin A1c     Status: None   Collection Time: 06/12/18  4:10 AM  Result  Value Ref Range   Hgb A1c MFr Bld 5.6 4.8 - 5.6 %    Comment: (NOTE) Pre diabetes:          5.7%-6.4% Diabetes:              >6.4% Glycemic control for   <7.0% adults with diabetes    Mean Plasma Glucose 114.02 mg/dL    Comment: Performed at [place] [address], [place], [place] [address]  Glucose, capillary     Status: Abnormal   Collection Time: 06/12/18  7:53 AM  Result Value Ref Range   Glucose-Capillary 198 (H) 70 - 99 mg/dL  Glucose, capillary     Status: Abnormal   Collection Time: 06/12/18  11:41 AM  Result Value Ref Range   Glucose-Capillary 208 (H) 70 - 99 mg/dL  Glucose, capillary     Status: Abnormal   Collection Time: 06/12/18  4:55 PM  Result Value Ref Range   Glucose-Capillary 262 (H) 70 - 99 mg/dL  Glucose, capillary     Status: Abnormal   Collection Time: 06/12/18  8:50 PM  Result Value Ref Range   Glucose-Capillary 265 (H) 70 - 99 mg/dL  CBC     Status: Abnormal   Collection Time: 06/13/18  4:31 AM  Result Value Ref Range   WBC 4.7 4.0 - 10.5 K/uL   RBC 4.16 3.87 - 5.11 MIL/uL   Hemoglobin 10.9 (L) 12.0 - 15.0 g/dL   HCT 37.0 36.0 - 46.0 %   MCV 88.9 80.0 - 100.0 fL   MCH 26.2 26.0 - 34.0 pg   MCHC 29.5 (L) 30.0 - 36.0 g/dL   RDW 16.6 (H) 11.5 - 15.5 %   Platelets 70 (L) 150 - 400 K/uL    Comment: Immature Platelet Fraction may be clinically indicated, consider ordering this additional test [id number] CONSISTENT WITH PREVIOUS RESULT    nRBC 0.0 0.0 - 0.2 %    Comment: Performed at [facility], [place] [address], [place], [place] [address]  Basic metabolic panel     Status: Abnormal   Collection Time: 06/13/18  4:31 AM  Result Value Ref Range   Sodium 134 (L) 135 - 145 mmol/L   Potassium 3.8 3.5 - 5.1 mmol/L   Chloride 87 (L) 98 - 111 mmol/L   CO2 35 (H) 22 - 32 mmol/L   Glucose, Bld 269 (H) 70 - 99 mg/dL   BUN 66 (H) 8 - 23 mg/dL   Creatinine, Ser 1.52 (H) 0.44 - 1.00 mg/dL   Calcium 8.4 (L) 8.9 - 10.3 mg/dL   GFR  calc non Af Amer 36 (L) >60 mL/min   GFR calc Af Amer 42 (L) >60 mL/min   Anion gap 12 5 - 15    Comment: Performed at [facility], [place] [address], [place], [place] [address]  D-dimer, quantitative (not at [facility])     Status: Abnormal   Collection Time: 06/13/18  7:18 AM  Result Value Ref Range   D-Dimer, Quant 2.04 (H) 0.00 - 0.50 ug/mL-FEU    Comment: (NOTE) At the manufacturer cut-off of 0.50 ug/mL FEU, this assay has been documented to exclude PE with a sensitivity and negative predictive value of 97 to 99%.  At this time, this assay has not been approved by the FDA to exclude DVT/VTE. Results should be correlated with clinical presentation. Performed at [facility], [place] [address], [place], [place] [address]   Glucose, capillary     Status: Abnormal   Collection Time: 06/13/18  7:41 AM  Result Value Ref Range   Glucose-Capillary 224 (H) 70 - 99 mg/dL  Glucose, capillary     Status: Abnormal   Collection Time: 06/13/18 12:10 PM  Result Value Ref Range   Glucose-Capillary 250 (H) 70 - 99 mg/dL  Glucose, capillary     Status: Abnormal   Collection Time: 06/13/18  5:00 PM  Result Value Ref Range   Glucose-Capillary 214 (H) 70 - 99 mg/dL  Glucose, capillary     Status: Abnormal   Collection Time: 06/13/18  9:11 PM  Result Value Ref Range   Glucose-Capillary 249 (H) 70 - 99 mg/dL   Comment 1 Notify RN  CBC     Status: Abnormal   Collection Time: 06/14/18  4:05 AM  Result Value Ref Range   WBC 5.7 4.0 - 10.5 K/uL   RBC 4.35 3.87 - 5.11 MIL/uL   Hemoglobin 11.5 (L) 12.0 - 15.0 g/dL   HCT 38.5 36.0 - 46.0 %   MCV 88.5 80.0 - 100.0 fL   MCH 26.4 26.0 - 34.0 pg   MCHC 29.9 (L) 30.0 - 36.0 g/dL   RDW 16.3 (H) 11.5 - 15.5 %   Platelets 74 (L) 150 - 400 K/uL    Comment: REPEATED TO VERIFY Immature Platelet Fraction may be clinically indicated, consider ordering this additional test [id number] CONSISTENT WITH PREVIOUS RESULT    nRBC 0.0  0.0 - 0.2 %    Comment: Performed at [facility], [place] [address] [place], [place] [address]  Basic metabolic panel     Status: Abnormal   Collection Time: 06/14/18  4:05 AM  Result Value Ref Range   Sodium 134 (L) 135 - 145 mmol/L   Potassium 4.2 3.5 - 5.1 mmol/L   Chloride 90 (L) 98 - 111 mmol/L   CO2 34 (H) 22 - 32 mmol/L   Glucose, Bld 236 (H) 70 - 99 mg/dL   BUN 56 (H) 8 - 23 mg/dL   Creatinine, Ser 1.28 (H) 0.44 - 1.00 mg/dL   Calcium 8.6 (L) 8.9 - 10.3 mg/dL   GFR calc non Af Amer 44 (L) >60 mL/min   GFR calc Af Amer 51 (L) >60 mL/min   Anion gap 10 5 - 15    Comment: Performed at [facility], [place] [address], [place], [place] [address]  Glucose, capillary     Status: Abnormal   Collection Time: 06/14/18  7:56 AM  Result Value Ref Range   Glucose-Capillary 223 (H) 70 - 99 mg/dL  Glucose, capillary     Status: Abnormal   Collection Time: 06/14/18 12:01 PM  Result Value Ref Range   Glucose-Capillary 273 (H) 70 - 99 mg/dL  Glucose, capillary     Status: Abnormal   Collection Time: 06/14/18  5:10 PM  Result Value Ref Range   Glucose-Capillary 214 (H) 70 - 99 mg/dL  Glucose, capillary     Status: Abnormal   Collection Time: 06/14/18 10:25 PM  Result Value Ref Range   Glucose-Capillary 150 (H) 70 - 99 mg/dL  CBC     Status: Abnormal   Collection Time: 06/15/18  4:11 AM  Result Value Ref Range   WBC 5.5 4.0 - 10.5 K/uL   RBC 4.48 3.87 - 5.11 MIL/uL   Hemoglobin 11.4 (L) 12.0 - 15.0 g/dL   HCT 39.1 36.0 - 46.0 %   MCV 87.3 80.0 - 100.0 fL   MCH 25.4 (L) 26.0 - 34.0 pg   MCHC 29.2 (L) 30.0 - 36.0 g/dL   RDW 16.5 (H) 11.5 - 15.5 %   Platelets 76 (L) 150 - 400 K/uL    Comment: REPEATED TO VERIFY Immature Platelet Fraction may be clinically indicated, consider ordering this additional test [id number] CONSISTENT WITH PREVIOUS RESULT    nRBC 0.0 0.0 - 0.2 %    Comment: Performed at [facility], [place] [address] [place], [place] [address]  Basic metabolic panel     Status: Abnormal   Collection Time: 06/15/18  4:11 AM  Result Value Ref Range   Sodium 135 135 - 145 mmol/L   Potassium 5.0 3.5 - 5.1 mmol/L  Chloride 92 (L) 98 - 111 mmol/L   CO2 33 (H) 22 - 32 mmol/L   Glucose, Bld 220 (H) 70 - 99 mg/dL   BUN 54 (H) 8 - 23 mg/dL   Creatinine, Ser 1.18 (H) 0.44 - 1.00 mg/dL   Calcium 8.6 (L) 8.9 - 10.3 mg/dL   GFR calc non Af Amer 49 (L) >60 mL/min   GFR calc Af Amer 56 (L) >60 mL/min   Anion gap 10 5 - 15    Comment: Performed at [facility], [place] [address], [place], [place] [address]  Glucose, capillary     Status: Abnormal   Collection Time: 06/15/18  7:50 AM  Result Value Ref Range   Glucose-Capillary 178 (H) 70 - 99 mg/dL  Glucose, capillary     Status: Abnormal   Collection Time: 06/15/18 11:38 AM  Result Value Ref Range   Glucose-Capillary 214 (H) 70 - 99 mg/dL  CBC w/Diff     Status: Abnormal   Collection Time: 06/21/18  2:33 PM  Result Value Ref Range   WBC 13.6 (H) 4.0 - 10.5 K/uL   RBC 4.39 3.87 - 5.11 Mil/uL   Hemoglobin 11.4 (L) 12.0 - 15.0 g/dL   HCT 36.4 36.0 - 46.0 %   MCV 82.9 78.0 - 100.0 fl   MCHC 31.4 30.0 - 36.0 g/dL   RDW 18.5 (H) 11.5 - 15.5 %   Platelets 96.0 (L) 150.0 - 400.0 K/uL   Neutrophils Relative % 94.8 (H) 43.0 - 77.0 %    Comment: Specimen too old for Manual Differential to be performed.   Lymphocytes Relative 2.1 (L) 12.0 - 46.0 %   Monocytes Relative 2.6 (L) 3.0 - 12.0 %   Eosinophils Relative 0.2 0.0 - 5.0 %   Basophils Relative 0.3 0.0 - 3.0 %   Neutro Abs 12.9 (H) 1.4 - 7.7 K/uL   Lymphs Abs 0.3 (L) 0.7 - 4.0 K/uL   Monocytes Absolute 0.4 0.1 - 1.0 K/uL   Eosinophils Absolute 0.0 0.0 - 0.7 K/uL   Basophils Absolute 0.0 0.0 - 0.1 K/uL  Basic metabolic panel     Status: Abnormal   Collection Time: 06/21/18  2:33 PM  Result Value Ref Range   Sodium 138 135 - 145 mEq/L   Potassium 4.6 3.5 - 5.1 mEq/L   Chloride 92 (L) 96 -  112 mEq/L   CO2 41 (H) 19 - 32 mEq/L   Glucose, Bld 286 (H) 70 - 99 mg/dL   BUN 50 (H) 6 - 23 mg/dL   Creatinine, Ser 1.15 0.40 - 1.20 mg/dL   Calcium 8.4 8.4 - 10.5 mg/dL   GFR 47.37 (L) >60.00 mL/min  Basic metabolic panel     Status: Abnormal   Collection Time: 07/13/18 10:54 AM  Result Value Ref Range   Sodium 141 135 - 145 mEq/L   Potassium 3.4 (L) 3.5 - 5.1 mEq/L   Chloride 92 (L) 96 - 112 mEq/L   CO2 42 (H) 19 - 32 mEq/L   Glucose, Bld 116 (H) 70 - 99 mg/dL   BUN 25 (H) 6 - 23 mg/dL   Creatinine, Ser 1.14 0.40 - 1.20 mg/dL   Calcium 8.7 8.4 - 10.5 mg/dL   GFR 47.84 (L) >60.00 mL/min  Brain natriuretic peptide     Status: Abnormal   Collection Time: 07/14/18  5:40 PM  Result Value Ref Range   B Natriuretic Peptide 581.0 (H) 0.0 - 100.0 pg/mL    Comment: Performed  at [facility], [address], [place], [place] [address]  Basic metabolic panel     Status: Abnormal   Collection Time: 07/14/18  5:40 PM  Result Value Ref Range   Sodium 140 135 - 145 mmol/L   Potassium 2.8 (L) 3.5 - 5.1 mmol/L   Chloride 91 (L) 98 - 111 mmol/L   CO2 39 (H) 22 - 32 mmol/L   Glucose, Bld 30 (LL) 70 - 99 mg/dL    Comment: CRITICAL RESULT CALLED TO, READ BACK BY AND VERIFIED WITH: DANIELS,B ON 07/14/18 AT 1910 BY LOY,C    BUN 26 (H) 8 - 23 mg/dL   Creatinine, Ser 1.10 (H) 0.44 - 1.00 mg/dL   Calcium 8.8 (L) 8.9 - 10.3 mg/dL   GFR calc non Af Amer 53 (L) >60 mL/min   GFR calc Af Amer >60 >60 mL/min   Anion gap 10 5 - 15    Comment: Performed at [facility], [address] [place], [place] [address]  CBC with Differential     Status: Abnormal   Collection Time: 07/14/18  5:40 PM  Result Value Ref Range   WBC 4.9 4.0 - 10.5 K/uL   RBC 4.09 3.87 - 5.11 MIL/uL   Hemoglobin 10.4 (L) 12.0 - 15.0 g/dL   HCT 36.8 36.0 - 46.0 %   MCV 90.0 80.0 - 100.0 fL   MCH 25.4 (L) 26.0 - 34.0 pg   MCHC 28.3 (L) 30.0 - 36.0 g/dL   RDW 17.2 (H) 11.5 - 15.5 %   Platelets 66 (L) 150 - 400 K/uL    Comment:  SPECIMEN CHECKED FOR CLOTS   nRBC 0.0 0.0 - 0.2 %   Neutrophils Relative % 65 %   Neutro Abs 3.2 1.7 - 7.7 K/uL   Lymphocytes Relative 22 %   Lymphs Abs 1.1 0.7 - 4.0 K/uL   Monocytes Relative 12 %   Monocytes Absolute 0.6 0.1 - 1.0 K/uL   Eosinophils Relative 0 %   Eosinophils Absolute 0.0 0.0 - 0.5 K/uL   Basophils Relative 0 %   Basophils Absolute 0.0 0.0 - 0.1 K/uL   Immature Granulocytes 1 %   Abs Immature Granulocytes 0.04 0.00 - 0.07 K/uL    Comment: Performed at [facility], [address] [place], [place] [address]  Glucose, capillary     Status: Abnormal   Collection Time: 07/14/18  5:45 PM  Result Value Ref Range   Glucose-Capillary 26 (LL) 70 - 99 mg/dL   Comment 1 Notify RN    Comment 2 Document in Chart   POC CBG, ED     Status: Abnormal   Collection Time: 07/14/18  6:12 PM  Result Value Ref Range   Glucose-Capillary 62 (L) 70 - 99 mg/dL  POC CBG, ED     Status: Abnormal   Collection Time: 07/14/18  7:18 PM  Result Value Ref Range   Glucose-Capillary 54 (L) 70 - 99 mg/dL  Troponin I - Once     Status: Abnormal   Collection Time: 07/14/18  8:18 PM  Result Value Ref Range   Troponin I 0.03 (HH) <0.03 ng/mL    Comment: CRITICAL RESULT CALLED TO, READ BACK BY AND VERIFIED WITH: WALL,E ON 07/14/18 AT 2120 BY LOY,C Performed at [facility], [address] [place], [place] [address]   CBG monitoring, ED     Status: None   Collection Time: 07/14/18  8:18 PM  Result Value Ref Range   Glucose-Capillary 85 70 - 99  mg/dL  CBG monitoring, ED     Status: Abnormal   Collection Time: 07/14/18  9:47 PM  Result Value Ref Range   Glucose-Capillary 147 (H) 70 - 99 mg/dL  CBG monitoring, ED     Status: Abnormal   Collection Time: 07/15/18 12:40 AM  Result Value Ref Range   Glucose-Capillary 186 (H) 70 - 99 mg/dL   Comment 1 Notify RN    Comment 2 Document in Chart   MRSA PCR Screening     Status: None   Collection Time: 07/15/18  1:09 AM  Result Value Ref Range   MRSA  by PCR NEGATIVE NEGATIVE    Comment:        The GeneXpert MRSA Assay (FDA approved for NASAL specimens only), is one component of a comprehensive MRSA colonization surveillance program. It is not intended to diagnose MRSA infection nor to guide or monitor treatment for MRSA infections. Performed at [facility], [address], [place], [place] [address]   Glucose, capillary     Status: Abnormal   Collection Time: 07/15/18  2:21 AM  Result Value Ref Range   Glucose-Capillary 140 (H) 70 - 99 mg/dL   Comment 1 Notify RN    Comment 2 Document in Chart   Glucose, capillary     Status: Abnormal   Collection Time: 07/15/18  3:47 AM  Result Value Ref Range   Glucose-Capillary 130 (H) 70 - 99 mg/dL   Comment 1 Notify RN    Comment 2 Document in Chart   Glucose, capillary     Status: Abnormal   Collection Time: 07/15/18  4:47 AM  Result Value Ref Range   Glucose-Capillary 129 (H) 70 - 99 mg/dL   Comment 1 Notify RN    Comment 2 Document in Chart   Glucose, capillary     Status: Abnormal   Collection Time: 07/15/18  6:05 AM  Result Value Ref Range   Glucose-Capillary 122 (H) 70 - 99 mg/dL   Comment 1 Notify RN    Comment 2 Document in Chart   Glucose, capillary     Status: Abnormal   Collection Time: 07/15/18  7:02 AM  Result Value Ref Range   Glucose-Capillary 130 (H) 70 - 99 mg/dL   Comment 1 Notify RN    Comment 2 Document in Chart   Glucose, capillary     Status: Abnormal   Collection Time: 07/15/18  8:10 AM  Result Value Ref Range   Glucose-Capillary 121 (H) 70 - 99 mg/dL  Basic metabolic panel     Status: Abnormal   Collection Time: 07/15/18 10:06 AM  Result Value Ref Range   Sodium 142 135 - 145 mmol/L   Potassium 4.0 3.5 - 5.1 mmol/L    Comment: DELTA CHECK NOTED NO VISIBLE HEMOLYSIS    Chloride 93 (L) 98 - 111 mmol/L   CO2 42 (H) 22 - 32 mmol/L   Glucose, Bld 163 (H) 70 - 99 mg/dL   BUN 21 8 - 23 mg/dL   Creatinine, Ser 1.15 (H) 0.44 - 1.00 mg/dL   Calcium  7.9 (L) 8.9 - 10.3 mg/dL   GFR calc non Af Amer 50 (L) >60 mL/min   GFR calc Af Amer 58 (L) >60 mL/min   Anion gap 7 5 - 15    Comment: Performed at [facility], [address] [place], [place] [address]  Glucose, capillary     Status: Abnormal   Collection Time: 07/15/18 12:36 PM  Result Value Ref  Range   Glucose-Capillary 165 (H) 70 - 99 mg/dL  Glucose, capillary     Status: Abnormal   Collection Time: 07/15/18  4:15 PM  Result Value Ref Range   Glucose-Capillary 203 (H) 70 - 99 mg/dL  Glucose, capillary     Status: Abnormal   Collection Time: 07/15/18  9:00 PM  Result Value Ref Range   Glucose-Capillary 166 (H) 70 - 99 mg/dL   Comment 1 Notify RN    Comment 2 Document in Chart   Basic metabolic panel     Status: Abnormal   Collection Time: 07/16/18  4:30 AM  Result Value Ref Range   Sodium 141 135 - 145 mmol/L   Potassium 3.8 3.5 - 5.1 mmol/L   Chloride 91 (L) 98 - 111 mmol/L   CO2 43 (H) 22 - 32 mmol/L   Glucose, Bld 129 (H) 70 - 99 mg/dL   BUN 20 8 - 23 mg/dL   Creatinine, Ser 1.12 (H) 0.44 - 1.00 mg/dL   Calcium 8.6 (L) 8.9 - 10.3 mg/dL   GFR calc non Af Amer 52 (L) >60 mL/min   GFR calc Af Amer >60 >60 mL/min   Anion gap 7 5 - 15    Comment: Performed at [facility], [address] [place], [place] [address]  CBC     Status: Abnormal   Collection Time: 07/16/18  4:30 AM  Result Value Ref Range   WBC 3.1 (L) 4.0 - 10.5 K/uL   RBC 3.36 (L) 3.87 - 5.11 MIL/uL   Hemoglobin 8.5 (L) 12.0 - 15.0 g/dL   HCT 30.1 (L) 36.0 - 46.0 %   MCV 89.6 80.0 - 100.0 fL   MCH 25.3 (L) 26.0 - 34.0 pg   MCHC 28.2 (L) 30.0 - 36.0 g/dL   RDW 17.6 (H) 11.5 - 15.5 %   Platelets 50 (L) 150 - 400 K/uL    Comment: SPECIMEN CHECKED FOR CLOTS   nRBC 0.0 0.0 - 0.2 %    Comment: Performed at [facility], [address] [place], [place] [address]  Pathologist smear review     Status: None   Collection Time: 07/16/18  4:30 AM  Result Value Ref Range   Path Review 07/18/2018     Comment:  PANCYTOPENIA WITH ELLIPTOCYTES AND POLYCHROMASIA Reviewed by Kalman Drape, M.D. Performed at [facility], [place] [address] [place], [place] [address]   Occult blood card to lab, stool RN will collect     Status: None   Collection Time: 07/16/18  7:52 AM  Result Value Ref Range   Fecal Occult Bld NEGATIVE NEGATIVE    Comment: Performed at [facility], [address] [place], [place] [address]  Vitamin B12     Status: None   Collection Time: 07/16/18  8:22 AM  Result Value Ref Range   Vitamin B-12 317 180 - 914 pg/mL    Comment: (NOTE) This assay is not validated for testing neonatal or myeloproliferative syndrome specimens for Vitamin B12 levels. Performed at [facility], [address] [place], [place] [address]   Folate     Status: None   Collection Time: 07/16/18  8:22 AM  Result Value Ref Range   Folate 12.0 >5.9 ng/mL    Comment: Performed at [facility], [address], [place], [place] [address]  Iron and TIBC     Status: None   Collection Time: 07/16/18  8:22 AM  Result Value Ref Range   Iron 33 28 -  170 ug/dL   TIBC 294 250 - 450 ug/dL   Saturation Ratios 11 10.4 - 31.8 %   UIBC 261 ug/dL    Comment: Performed at [facility], [address], [place], [place] [address]  Ferritin     Status: None   Collection Time: 07/16/18  8:22 AM  Result Value Ref Range   Ferritin 40 11 - 307 ng/mL    Comment: Performed at [facility], [address], [place], [place] [address]  Reticulocytes     Status: Abnormal   Collection Time: 07/16/18  8:22 AM  Result Value Ref Range   Retic Ct Pct 3.0 0.4 - 3.1 %   RBC. 3.38 (L) 3.87 - 5.11 MIL/uL   Retic Count, Absolute 102.1 19.0 - 186.0 K/uL   Immature Retic Fract 27.7 (H) 2.3 - 15.9 %    Comment: Performed at [facility], [address] [place], [place] [address]  Lactate dehydrogenase     Status: None   Collection Time: 07/16/18  8:22 AM  Result Value Ref Range   LDH 102 98 - 192 U/L    Comment: Performed at  [facility], [address] [place], [place] [address]  Haptoglobin     Status: None   Collection Time: 07/16/18  8:22 AM  Result Value Ref Range   Haptoglobin 131 37 - 355 mg/dL    Comment: (NOTE) Performed At: [facility], [place] [id number] Rush Farmer MD WY:[id number]   Glucose, capillary     Status: Abnormal   Collection Time: 07/16/18 11:19 AM  Result Value Ref Range   Glucose-Capillary 172 (H) 70 - 99 mg/dL  Troponin I - Now Then Q6H     Status: None   Collection Time: 07/16/18  3:42 PM  Result Value Ref Range   Troponin I <0.03 <0.03 ng/mL    Comment: Performed at [facility], [address] [place], [place] [address]  Glucose, capillary     Status: Abnormal   Collection Time: 07/16/18  4:19 PM  Result Value Ref Range   Glucose-Capillary 177 (H) 70 - 99 mg/dL  Troponin I - Now Then Q6H     Status: Abnormal   Collection Time: 07/16/18  9:08 PM  Result Value Ref Range   Troponin I 0.03 (HH) <0.03 ng/mL    Comment: CRITICAL RESULT CALLED TO, READ BACK BY AND VERIFIED WITH: WAGNER,R @ 2213 ON 07/16/18 BY JUW Performed at [facility], [address], [place], [place] [address]   Glucose, capillary     Status: Abnormal   Collection Time: 07/16/18  9:08 PM  Result Value Ref Range   Glucose-Capillary 164 (H) 70 - 99 mg/dL   Comment 1 Notify RN    Comment 2 Document in Chart   Troponin I - Now Then Q6H     Status: Abnormal   Collection Time: 07/17/18  2:52 AM  Result Value Ref Range   Troponin I 0.03 (HH) <0.03 ng/mL    Comment: CRITICAL VALUE NOTED.  VALUE IS CONSISTENT WITH PREVIOUSLY REPORTED AND CALLED VALUE. Performed at [facility], [address] [place], [place] [address]   Basic metabolic panel     Status: Abnormal   Collection Time: 07/17/18  2:52 AM  Result Value Ref Range   Sodium 142 135 - 145 mmol/L   Potassium 4.5 3.5 - 5.1 mmol/L   Chloride 89 (L) 98 - 111 mmol/L   CO2 45 (H) 22 - 32 mmol/L   Glucose,  Bld 138 (H) 70  - 99 mg/dL   BUN 23 8 - 23 mg/dL   Creatinine, Ser 1.33 (H) 0.44 - 1.00 mg/dL   Calcium 8.6 (L) 8.9 - 10.3 mg/dL   GFR calc non Af Amer 42 (L) >60 mL/min   GFR calc Af Amer 49 (L) >60 mL/min   Anion gap 8 5 - 15    Comment: Performed at [facility], [address], [place], [place] [address]  CBC     Status: Abnormal   Collection Time: 07/17/18  2:52 AM  Result Value Ref Range   WBC 3.4 (L) 4.0 - 10.5 K/uL   RBC 3.44 (L) 3.87 - 5.11 MIL/uL   Hemoglobin 8.7 (L) 12.0 - 15.0 g/dL   HCT 31.2 (L) 36.0 - 46.0 %   MCV 90.7 80.0 - 100.0 fL   MCH 25.3 (L) 26.0 - 34.0 pg   MCHC 27.9 (L) 30.0 - 36.0 g/dL   RDW 17.9 (H) 11.5 - 15.5 %   Platelets 52 (L) 150 - 400 K/uL    Comment: SPECIMEN CHECKED FOR CLOTS   nRBC 0.0 0.0 - 0.2 %    Comment: Performed at [facility], [address] [place], [place] [address]  Magnesium     Status: Abnormal   Collection Time: 07/17/18  2:52 AM  Result Value Ref Range   Magnesium 1.5 (L) 1.7 - 2.4 mg/dL    Comment: Performed at [facility], [address] [place], [place] [address]  Glucose, capillary     Status: Abnormal   Collection Time: 07/17/18  8:15 AM  Result Value Ref Range   Glucose-Capillary 124 (H) 70 - 99 mg/dL   Assessment/Plan: 1. Acute on chronic diastolic heart failure (HCC) Significant diuresis in hospital. Is taking her diuretic and potassium supplements. Euvolemic on examination today. O2 stable. Repeat labs. Referral placed to Cardiology for insurance purposes. Will have [facility] to expedite appointment. Strict return precautions reviewed. - CBC w/Diff - Basic metabolic panel - Ambulatory referral to Cardiology  2. Chronic obstructive pulmonary disease, unspecified COPD type ([place]) Stable today. Follow-up with Pulmonology as scheduled.   3. Chronic respiratory failure with hypoxia and hypercapnia (HCC) O2 stable. Continue management per Cardiology.  4. CKD (chronic kidney disease), stage III ([place]) Repeat labs today. - CBC w/Diff -  Basic metabolic panel  5. Thrombocytopenia ([place]) 6. Splenomegaly Referral to Hematology placed for further assessment. Repeat CBC today. - Ambulatory referral to Hematology  7. Pressure injury of left buttock, stage 2 ([place]) Bandaging applied. Supportive measures reviewed. Will send orders for [facility] to start skilled wound care. Close follow-up scheduled.    [name], PA-C

## 2018-07-31 NOTE — Patient Instructions (Signed)
Please go to the lab today for blood work.  I will call you with your results. We will alter treatment regimen(s) if indicated by your results.   Continue medication regimen. Keep close watch on glucose levels. Call me in 1 week with averages.   We will send the wound care nurse out tomorrow to start working on the area. Continue your antifungal powder  Follow-up with specialists as scheduled.  I have placed referrals for you for the specialists. [name] Kitchen

## 2018-08-01 ENCOUNTER — Other Ambulatory Visit: Payer: Self-pay | Admitting: Physician Assistant

## 2018-08-01 ENCOUNTER — Other Ambulatory Visit: Payer: Self-pay

## 2018-08-01 ENCOUNTER — Other Ambulatory Visit: Payer: Self-pay | Admitting: Gastroenterology

## 2018-08-01 DIAGNOSIS — J449 Chronic obstructive pulmonary disease, unspecified: Secondary | ICD-10-CM | POA: Diagnosis not present

## 2018-08-02 ENCOUNTER — Telehealth: Payer: Self-pay | Admitting: Physician Assistant

## 2018-08-02 DIAGNOSIS — G894 Chronic pain syndrome: Secondary | ICD-10-CM | POA: Diagnosis not present

## 2018-08-02 DIAGNOSIS — G2581 Restless legs syndrome: Secondary | ICD-10-CM | POA: Diagnosis not present

## 2018-08-02 DIAGNOSIS — Z9981 Dependence on supplemental oxygen: Secondary | ICD-10-CM | POA: Diagnosis not present

## 2018-08-02 DIAGNOSIS — I5033 Acute on chronic diastolic (congestive) heart failure: Secondary | ICD-10-CM | POA: Diagnosis not present

## 2018-08-02 DIAGNOSIS — J9621 Acute and chronic respiratory failure with hypoxia: Secondary | ICD-10-CM | POA: Diagnosis not present

## 2018-08-02 DIAGNOSIS — E1140 Type 2 diabetes mellitus with diabetic neuropathy, unspecified: Secondary | ICD-10-CM | POA: Diagnosis not present

## 2018-08-02 DIAGNOSIS — G4733 Obstructive sleep apnea (adult) (pediatric): Secondary | ICD-10-CM | POA: Diagnosis not present

## 2018-08-02 DIAGNOSIS — E1143 Type 2 diabetes mellitus with diabetic autonomic (poly)neuropathy: Secondary | ICD-10-CM | POA: Diagnosis not present

## 2018-08-02 DIAGNOSIS — I471 Supraventricular tachycardia: Secondary | ICD-10-CM | POA: Diagnosis not present

## 2018-08-02 DIAGNOSIS — K3184 Gastroparesis: Secondary | ICD-10-CM | POA: Diagnosis not present

## 2018-08-02 DIAGNOSIS — E039 Hypothyroidism, unspecified: Secondary | ICD-10-CM | POA: Diagnosis not present

## 2018-08-02 DIAGNOSIS — M1991 Primary osteoarthritis, unspecified site: Secondary | ICD-10-CM | POA: Diagnosis not present

## 2018-08-02 DIAGNOSIS — J9622 Acute and chronic respiratory failure with hypercapnia: Secondary | ICD-10-CM | POA: Diagnosis not present

## 2018-08-02 DIAGNOSIS — N183 Chronic kidney disease, stage 3 (moderate): Secondary | ICD-10-CM | POA: Diagnosis not present

## 2018-08-02 DIAGNOSIS — M797 Fibromyalgia: Secondary | ICD-10-CM | POA: Diagnosis not present

## 2018-08-02 DIAGNOSIS — I130 Hypertensive heart and chronic kidney disease with heart failure and stage 1 through stage 4 chronic kidney disease, or unspecified chronic kidney disease: Secondary | ICD-10-CM | POA: Diagnosis not present

## 2018-08-02 DIAGNOSIS — E1122 Type 2 diabetes mellitus with diabetic chronic kidney disease: Secondary | ICD-10-CM | POA: Diagnosis not present

## 2018-08-02 DIAGNOSIS — I872 Venous insufficiency (chronic) (peripheral): Secondary | ICD-10-CM | POA: Diagnosis not present

## 2018-08-02 DIAGNOSIS — J439 Emphysema, unspecified: Secondary | ICD-10-CM | POA: Diagnosis not present

## 2018-08-02 DIAGNOSIS — I2781 Cor pulmonale (chronic): Secondary | ICD-10-CM | POA: Diagnosis not present

## 2018-08-02 DIAGNOSIS — E1151 Type 2 diabetes mellitus with diabetic peripheral angiopathy without gangrene: Secondary | ICD-10-CM | POA: Diagnosis not present

## 2018-08-02 NOTE — Telephone Encounter (Signed)
Ok for verbal orders ?

## 2018-08-02 NOTE — Telephone Encounter (Signed)
Copied from [place] [phone]. Topic: Quick Communication - Home Health Verbal Orders >> Aug 02, 2018  2:48 PM Alanda Slim E wrote: Caller/Agency: Stephanie/ [facility]  Callback Number: [phone] Requesting skilled nursing for stage 2 wound on buttock that is draining moderately  Frequency: until healed

## 2018-08-02 NOTE — Telephone Encounter (Signed)
Verbal orders given for treatment 3 times weekly.

## 2018-08-03 ENCOUNTER — Telehealth: Payer: Self-pay | Admitting: Physician Assistant

## 2018-08-03 NOTE — Telephone Encounter (Signed)
Copied from [place] [phone]. Topic: General - Other >> Aug 03, 2018  9:34 AM Loma Boston wrote: [place] care  calling [phone]. States that she was to start Phy T back with pt this week and that patient is refusing due to the wound on her leg.  She is going to try to meet with her again this coming Tuesday

## 2018-08-04 DIAGNOSIS — J439 Emphysema, unspecified: Secondary | ICD-10-CM | POA: Diagnosis not present

## 2018-08-04 DIAGNOSIS — G894 Chronic pain syndrome: Secondary | ICD-10-CM | POA: Diagnosis not present

## 2018-08-04 DIAGNOSIS — G2581 Restless legs syndrome: Secondary | ICD-10-CM | POA: Diagnosis not present

## 2018-08-04 DIAGNOSIS — K3184 Gastroparesis: Secondary | ICD-10-CM | POA: Diagnosis not present

## 2018-08-04 DIAGNOSIS — J9621 Acute and chronic respiratory failure with hypoxia: Secondary | ICD-10-CM | POA: Diagnosis not present

## 2018-08-04 DIAGNOSIS — E1140 Type 2 diabetes mellitus with diabetic neuropathy, unspecified: Secondary | ICD-10-CM | POA: Diagnosis not present

## 2018-08-04 DIAGNOSIS — E039 Hypothyroidism, unspecified: Secondary | ICD-10-CM | POA: Diagnosis not present

## 2018-08-04 DIAGNOSIS — E1143 Type 2 diabetes mellitus with diabetic autonomic (poly)neuropathy: Secondary | ICD-10-CM | POA: Diagnosis not present

## 2018-08-04 DIAGNOSIS — E1151 Type 2 diabetes mellitus with diabetic peripheral angiopathy without gangrene: Secondary | ICD-10-CM | POA: Diagnosis not present

## 2018-08-04 DIAGNOSIS — G4733 Obstructive sleep apnea (adult) (pediatric): Secondary | ICD-10-CM | POA: Diagnosis not present

## 2018-08-04 DIAGNOSIS — N183 Chronic kidney disease, stage 3 (moderate): Secondary | ICD-10-CM | POA: Diagnosis not present

## 2018-08-04 DIAGNOSIS — J9622 Acute and chronic respiratory failure with hypercapnia: Secondary | ICD-10-CM | POA: Diagnosis not present

## 2018-08-04 DIAGNOSIS — I2781 Cor pulmonale (chronic): Secondary | ICD-10-CM | POA: Diagnosis not present

## 2018-08-04 DIAGNOSIS — Z9981 Dependence on supplemental oxygen: Secondary | ICD-10-CM | POA: Diagnosis not present

## 2018-08-04 DIAGNOSIS — I471 Supraventricular tachycardia: Secondary | ICD-10-CM | POA: Diagnosis not present

## 2018-08-04 DIAGNOSIS — I5033 Acute on chronic diastolic (congestive) heart failure: Secondary | ICD-10-CM | POA: Diagnosis not present

## 2018-08-04 DIAGNOSIS — I130 Hypertensive heart and chronic kidney disease with heart failure and stage 1 through stage 4 chronic kidney disease, or unspecified chronic kidney disease: Secondary | ICD-10-CM | POA: Diagnosis not present

## 2018-08-04 DIAGNOSIS — M1991 Primary osteoarthritis, unspecified site: Secondary | ICD-10-CM | POA: Diagnosis not present

## 2018-08-04 DIAGNOSIS — M797 Fibromyalgia: Secondary | ICD-10-CM | POA: Diagnosis not present

## 2018-08-04 DIAGNOSIS — E1122 Type 2 diabetes mellitus with diabetic chronic kidney disease: Secondary | ICD-10-CM | POA: Diagnosis not present

## 2018-08-04 DIAGNOSIS — I872 Venous insufficiency (chronic) (peripheral): Secondary | ICD-10-CM | POA: Diagnosis not present

## 2018-08-07 DIAGNOSIS — N183 Chronic kidney disease, stage 3 (moderate): Secondary | ICD-10-CM | POA: Diagnosis not present

## 2018-08-07 DIAGNOSIS — J9621 Acute and chronic respiratory failure with hypoxia: Secondary | ICD-10-CM | POA: Diagnosis not present

## 2018-08-07 DIAGNOSIS — Z9981 Dependence on supplemental oxygen: Secondary | ICD-10-CM | POA: Diagnosis not present

## 2018-08-07 DIAGNOSIS — M1991 Primary osteoarthritis, unspecified site: Secondary | ICD-10-CM | POA: Diagnosis not present

## 2018-08-07 DIAGNOSIS — E039 Hypothyroidism, unspecified: Secondary | ICD-10-CM | POA: Diagnosis not present

## 2018-08-07 DIAGNOSIS — E1122 Type 2 diabetes mellitus with diabetic chronic kidney disease: Secondary | ICD-10-CM | POA: Diagnosis not present

## 2018-08-07 DIAGNOSIS — I130 Hypertensive heart and chronic kidney disease with heart failure and stage 1 through stage 4 chronic kidney disease, or unspecified chronic kidney disease: Secondary | ICD-10-CM | POA: Diagnosis not present

## 2018-08-07 DIAGNOSIS — I471 Supraventricular tachycardia: Secondary | ICD-10-CM | POA: Diagnosis not present

## 2018-08-07 DIAGNOSIS — I5033 Acute on chronic diastolic (congestive) heart failure: Secondary | ICD-10-CM | POA: Diagnosis not present

## 2018-08-07 DIAGNOSIS — G4733 Obstructive sleep apnea (adult) (pediatric): Secondary | ICD-10-CM | POA: Diagnosis not present

## 2018-08-07 DIAGNOSIS — J439 Emphysema, unspecified: Secondary | ICD-10-CM | POA: Diagnosis not present

## 2018-08-07 DIAGNOSIS — G2581 Restless legs syndrome: Secondary | ICD-10-CM | POA: Diagnosis not present

## 2018-08-07 DIAGNOSIS — E1151 Type 2 diabetes mellitus with diabetic peripheral angiopathy without gangrene: Secondary | ICD-10-CM | POA: Diagnosis not present

## 2018-08-07 DIAGNOSIS — G894 Chronic pain syndrome: Secondary | ICD-10-CM | POA: Diagnosis not present

## 2018-08-07 DIAGNOSIS — I872 Venous insufficiency (chronic) (peripheral): Secondary | ICD-10-CM | POA: Diagnosis not present

## 2018-08-07 DIAGNOSIS — I2781 Cor pulmonale (chronic): Secondary | ICD-10-CM | POA: Diagnosis not present

## 2018-08-07 DIAGNOSIS — M797 Fibromyalgia: Secondary | ICD-10-CM | POA: Diagnosis not present

## 2018-08-07 DIAGNOSIS — J9622 Acute and chronic respiratory failure with hypercapnia: Secondary | ICD-10-CM | POA: Diagnosis not present

## 2018-08-07 DIAGNOSIS — K3184 Gastroparesis: Secondary | ICD-10-CM | POA: Diagnosis not present

## 2018-08-07 DIAGNOSIS — E1140 Type 2 diabetes mellitus with diabetic neuropathy, unspecified: Secondary | ICD-10-CM | POA: Diagnosis not present

## 2018-08-07 DIAGNOSIS — E1143 Type 2 diabetes mellitus with diabetic autonomic (poly)neuropathy: Secondary | ICD-10-CM | POA: Diagnosis not present

## 2018-08-08 ENCOUNTER — Telehealth: Payer: Self-pay

## 2018-08-08 DIAGNOSIS — G4733 Obstructive sleep apnea (adult) (pediatric): Secondary | ICD-10-CM | POA: Diagnosis not present

## 2018-08-08 DIAGNOSIS — J9621 Acute and chronic respiratory failure with hypoxia: Secondary | ICD-10-CM | POA: Diagnosis not present

## 2018-08-08 DIAGNOSIS — Z9981 Dependence on supplemental oxygen: Secondary | ICD-10-CM | POA: Diagnosis not present

## 2018-08-08 DIAGNOSIS — E039 Hypothyroidism, unspecified: Secondary | ICD-10-CM | POA: Diagnosis not present

## 2018-08-08 DIAGNOSIS — M797 Fibromyalgia: Secondary | ICD-10-CM | POA: Diagnosis not present

## 2018-08-08 DIAGNOSIS — I471 Supraventricular tachycardia: Secondary | ICD-10-CM | POA: Diagnosis not present

## 2018-08-08 DIAGNOSIS — I130 Hypertensive heart and chronic kidney disease with heart failure and stage 1 through stage 4 chronic kidney disease, or unspecified chronic kidney disease: Secondary | ICD-10-CM | POA: Diagnosis not present

## 2018-08-08 DIAGNOSIS — J9622 Acute and chronic respiratory failure with hypercapnia: Secondary | ICD-10-CM | POA: Diagnosis not present

## 2018-08-08 DIAGNOSIS — M1991 Primary osteoarthritis, unspecified site: Secondary | ICD-10-CM | POA: Diagnosis not present

## 2018-08-08 DIAGNOSIS — I872 Venous insufficiency (chronic) (peripheral): Secondary | ICD-10-CM | POA: Diagnosis not present

## 2018-08-08 DIAGNOSIS — K3184 Gastroparesis: Secondary | ICD-10-CM | POA: Diagnosis not present

## 2018-08-08 DIAGNOSIS — I2781 Cor pulmonale (chronic): Secondary | ICD-10-CM | POA: Diagnosis not present

## 2018-08-08 DIAGNOSIS — E1143 Type 2 diabetes mellitus with diabetic autonomic (poly)neuropathy: Secondary | ICD-10-CM | POA: Diagnosis not present

## 2018-08-08 DIAGNOSIS — G2581 Restless legs syndrome: Secondary | ICD-10-CM | POA: Diagnosis not present

## 2018-08-08 DIAGNOSIS — E1140 Type 2 diabetes mellitus with diabetic neuropathy, unspecified: Secondary | ICD-10-CM | POA: Diagnosis not present

## 2018-08-08 DIAGNOSIS — J439 Emphysema, unspecified: Secondary | ICD-10-CM | POA: Diagnosis not present

## 2018-08-08 DIAGNOSIS — I5033 Acute on chronic diastolic (congestive) heart failure: Secondary | ICD-10-CM | POA: Diagnosis not present

## 2018-08-08 DIAGNOSIS — N183 Chronic kidney disease, stage 3 (moderate): Secondary | ICD-10-CM | POA: Diagnosis not present

## 2018-08-08 DIAGNOSIS — E1122 Type 2 diabetes mellitus with diabetic chronic kidney disease: Secondary | ICD-10-CM | POA: Diagnosis not present

## 2018-08-08 DIAGNOSIS — E1151 Type 2 diabetes mellitus with diabetic peripheral angiopathy without gangrene: Secondary | ICD-10-CM | POA: Diagnosis not present

## 2018-08-08 DIAGNOSIS — G894 Chronic pain syndrome: Secondary | ICD-10-CM | POA: Diagnosis not present

## 2018-08-08 NOTE — Telephone Encounter (Signed)
Called pt to discuss her appointment with Dr. Domenic Polite coming up on Friday. She states she would rather reschedule due to the coronavirus. She states she is on oxygen and she is nervous in coming out of house. I will forward to front office to reschedule.

## 2018-08-09 DIAGNOSIS — G2581 Restless legs syndrome: Secondary | ICD-10-CM | POA: Diagnosis not present

## 2018-08-09 DIAGNOSIS — Z9981 Dependence on supplemental oxygen: Secondary | ICD-10-CM | POA: Diagnosis not present

## 2018-08-09 DIAGNOSIS — E1140 Type 2 diabetes mellitus with diabetic neuropathy, unspecified: Secondary | ICD-10-CM | POA: Diagnosis not present

## 2018-08-09 DIAGNOSIS — N183 Chronic kidney disease, stage 3 (moderate): Secondary | ICD-10-CM | POA: Diagnosis not present

## 2018-08-09 DIAGNOSIS — K3184 Gastroparesis: Secondary | ICD-10-CM | POA: Diagnosis not present

## 2018-08-09 DIAGNOSIS — I5033 Acute on chronic diastolic (congestive) heart failure: Secondary | ICD-10-CM | POA: Diagnosis not present

## 2018-08-09 DIAGNOSIS — I130 Hypertensive heart and chronic kidney disease with heart failure and stage 1 through stage 4 chronic kidney disease, or unspecified chronic kidney disease: Secondary | ICD-10-CM | POA: Diagnosis not present

## 2018-08-09 DIAGNOSIS — M1991 Primary osteoarthritis, unspecified site: Secondary | ICD-10-CM | POA: Diagnosis not present

## 2018-08-09 DIAGNOSIS — I471 Supraventricular tachycardia: Secondary | ICD-10-CM | POA: Diagnosis not present

## 2018-08-09 DIAGNOSIS — J9622 Acute and chronic respiratory failure with hypercapnia: Secondary | ICD-10-CM | POA: Diagnosis not present

## 2018-08-09 DIAGNOSIS — I2781 Cor pulmonale (chronic): Secondary | ICD-10-CM | POA: Diagnosis not present

## 2018-08-09 DIAGNOSIS — M797 Fibromyalgia: Secondary | ICD-10-CM | POA: Diagnosis not present

## 2018-08-09 DIAGNOSIS — E039 Hypothyroidism, unspecified: Secondary | ICD-10-CM | POA: Diagnosis not present

## 2018-08-09 DIAGNOSIS — G894 Chronic pain syndrome: Secondary | ICD-10-CM | POA: Diagnosis not present

## 2018-08-09 DIAGNOSIS — J9621 Acute and chronic respiratory failure with hypoxia: Secondary | ICD-10-CM | POA: Diagnosis not present

## 2018-08-09 DIAGNOSIS — E1143 Type 2 diabetes mellitus with diabetic autonomic (poly)neuropathy: Secondary | ICD-10-CM | POA: Diagnosis not present

## 2018-08-09 DIAGNOSIS — J439 Emphysema, unspecified: Secondary | ICD-10-CM | POA: Diagnosis not present

## 2018-08-09 DIAGNOSIS — G4733 Obstructive sleep apnea (adult) (pediatric): Secondary | ICD-10-CM | POA: Diagnosis not present

## 2018-08-09 DIAGNOSIS — I872 Venous insufficiency (chronic) (peripheral): Secondary | ICD-10-CM | POA: Diagnosis not present

## 2018-08-09 DIAGNOSIS — E1151 Type 2 diabetes mellitus with diabetic peripheral angiopathy without gangrene: Secondary | ICD-10-CM | POA: Diagnosis not present

## 2018-08-09 DIAGNOSIS — E1122 Type 2 diabetes mellitus with diabetic chronic kidney disease: Secondary | ICD-10-CM | POA: Diagnosis not present

## 2018-08-11 ENCOUNTER — Ambulatory Visit: Payer: Medicare Other | Admitting: Cardiology

## 2018-08-11 DIAGNOSIS — I5033 Acute on chronic diastolic (congestive) heart failure: Secondary | ICD-10-CM | POA: Diagnosis not present

## 2018-08-11 DIAGNOSIS — M1991 Primary osteoarthritis, unspecified site: Secondary | ICD-10-CM | POA: Diagnosis not present

## 2018-08-11 DIAGNOSIS — I872 Venous insufficiency (chronic) (peripheral): Secondary | ICD-10-CM | POA: Diagnosis not present

## 2018-08-11 DIAGNOSIS — I2781 Cor pulmonale (chronic): Secondary | ICD-10-CM | POA: Diagnosis not present

## 2018-08-11 DIAGNOSIS — J9621 Acute and chronic respiratory failure with hypoxia: Secondary | ICD-10-CM | POA: Diagnosis not present

## 2018-08-11 DIAGNOSIS — E1122 Type 2 diabetes mellitus with diabetic chronic kidney disease: Secondary | ICD-10-CM | POA: Diagnosis not present

## 2018-08-11 DIAGNOSIS — N183 Chronic kidney disease, stage 3 (moderate): Secondary | ICD-10-CM | POA: Diagnosis not present

## 2018-08-11 DIAGNOSIS — E1143 Type 2 diabetes mellitus with diabetic autonomic (poly)neuropathy: Secondary | ICD-10-CM | POA: Diagnosis not present

## 2018-08-11 DIAGNOSIS — G4733 Obstructive sleep apnea (adult) (pediatric): Secondary | ICD-10-CM | POA: Diagnosis not present

## 2018-08-11 DIAGNOSIS — I471 Supraventricular tachycardia: Secondary | ICD-10-CM | POA: Diagnosis not present

## 2018-08-11 DIAGNOSIS — M797 Fibromyalgia: Secondary | ICD-10-CM | POA: Diagnosis not present

## 2018-08-11 DIAGNOSIS — Z9981 Dependence on supplemental oxygen: Secondary | ICD-10-CM | POA: Diagnosis not present

## 2018-08-11 DIAGNOSIS — E1140 Type 2 diabetes mellitus with diabetic neuropathy, unspecified: Secondary | ICD-10-CM | POA: Diagnosis not present

## 2018-08-11 DIAGNOSIS — K3184 Gastroparesis: Secondary | ICD-10-CM | POA: Diagnosis not present

## 2018-08-11 DIAGNOSIS — E039 Hypothyroidism, unspecified: Secondary | ICD-10-CM | POA: Diagnosis not present

## 2018-08-11 DIAGNOSIS — E1151 Type 2 diabetes mellitus with diabetic peripheral angiopathy without gangrene: Secondary | ICD-10-CM | POA: Diagnosis not present

## 2018-08-11 DIAGNOSIS — J9622 Acute and chronic respiratory failure with hypercapnia: Secondary | ICD-10-CM | POA: Diagnosis not present

## 2018-08-11 DIAGNOSIS — I130 Hypertensive heart and chronic kidney disease with heart failure and stage 1 through stage 4 chronic kidney disease, or unspecified chronic kidney disease: Secondary | ICD-10-CM | POA: Diagnosis not present

## 2018-08-11 DIAGNOSIS — G894 Chronic pain syndrome: Secondary | ICD-10-CM | POA: Diagnosis not present

## 2018-08-11 DIAGNOSIS — J439 Emphysema, unspecified: Secondary | ICD-10-CM | POA: Diagnosis not present

## 2018-08-11 DIAGNOSIS — G2581 Restless legs syndrome: Secondary | ICD-10-CM | POA: Diagnosis not present

## 2018-08-14 ENCOUNTER — Telehealth: Payer: Self-pay | Admitting: Physician Assistant

## 2018-08-14 DIAGNOSIS — I2781 Cor pulmonale (chronic): Secondary | ICD-10-CM | POA: Diagnosis not present

## 2018-08-14 DIAGNOSIS — E1143 Type 2 diabetes mellitus with diabetic autonomic (poly)neuropathy: Secondary | ICD-10-CM | POA: Diagnosis not present

## 2018-08-14 DIAGNOSIS — M797 Fibromyalgia: Secondary | ICD-10-CM | POA: Diagnosis not present

## 2018-08-14 DIAGNOSIS — I130 Hypertensive heart and chronic kidney disease with heart failure and stage 1 through stage 4 chronic kidney disease, or unspecified chronic kidney disease: Secondary | ICD-10-CM | POA: Diagnosis not present

## 2018-08-14 DIAGNOSIS — G4733 Obstructive sleep apnea (adult) (pediatric): Secondary | ICD-10-CM | POA: Diagnosis not present

## 2018-08-14 DIAGNOSIS — I471 Supraventricular tachycardia: Secondary | ICD-10-CM | POA: Diagnosis not present

## 2018-08-14 DIAGNOSIS — M1991 Primary osteoarthritis, unspecified site: Secondary | ICD-10-CM | POA: Diagnosis not present

## 2018-08-14 DIAGNOSIS — J439 Emphysema, unspecified: Secondary | ICD-10-CM | POA: Diagnosis not present

## 2018-08-14 DIAGNOSIS — I872 Venous insufficiency (chronic) (peripheral): Secondary | ICD-10-CM | POA: Diagnosis not present

## 2018-08-14 DIAGNOSIS — G894 Chronic pain syndrome: Secondary | ICD-10-CM | POA: Diagnosis not present

## 2018-08-14 DIAGNOSIS — E039 Hypothyroidism, unspecified: Secondary | ICD-10-CM | POA: Diagnosis not present

## 2018-08-14 DIAGNOSIS — E1122 Type 2 diabetes mellitus with diabetic chronic kidney disease: Secondary | ICD-10-CM | POA: Diagnosis not present

## 2018-08-14 DIAGNOSIS — J9621 Acute and chronic respiratory failure with hypoxia: Secondary | ICD-10-CM | POA: Diagnosis not present

## 2018-08-14 DIAGNOSIS — E1151 Type 2 diabetes mellitus with diabetic peripheral angiopathy without gangrene: Secondary | ICD-10-CM | POA: Diagnosis not present

## 2018-08-14 DIAGNOSIS — N183 Chronic kidney disease, stage 3 (moderate): Secondary | ICD-10-CM | POA: Diagnosis not present

## 2018-08-14 DIAGNOSIS — Z9981 Dependence on supplemental oxygen: Secondary | ICD-10-CM | POA: Diagnosis not present

## 2018-08-14 DIAGNOSIS — K3184 Gastroparesis: Secondary | ICD-10-CM | POA: Diagnosis not present

## 2018-08-14 DIAGNOSIS — E1140 Type 2 diabetes mellitus with diabetic neuropathy, unspecified: Secondary | ICD-10-CM | POA: Diagnosis not present

## 2018-08-14 DIAGNOSIS — G2581 Restless legs syndrome: Secondary | ICD-10-CM | POA: Diagnosis not present

## 2018-08-14 DIAGNOSIS — I5033 Acute on chronic diastolic (congestive) heart failure: Secondary | ICD-10-CM | POA: Diagnosis not present

## 2018-08-14 DIAGNOSIS — J9622 Acute and chronic respiratory failure with hypercapnia: Secondary | ICD-10-CM | POA: Diagnosis not present

## 2018-08-14 NOTE — Telephone Encounter (Signed)
FYI

## 2018-08-14 NOTE — Telephone Encounter (Signed)
Renee Noble w/[facility], states that pt missed/declined scheduled visit and asked that they call back Monday.

## 2018-08-15 ENCOUNTER — Ambulatory Visit (INDEPENDENT_AMBULATORY_CARE_PROVIDER_SITE_OTHER): Payer: Medicare Other | Admitting: Physician Assistant

## 2018-08-15 ENCOUNTER — Encounter: Payer: Self-pay | Admitting: Physician Assistant

## 2018-08-15 ENCOUNTER — Ambulatory Visit: Payer: Medicare Other | Admitting: Physician Assistant

## 2018-08-15 ENCOUNTER — Other Ambulatory Visit: Payer: Self-pay

## 2018-08-15 VITALS — HR 75 | Wt 272.0 lb

## 2018-08-15 DIAGNOSIS — J449 Chronic obstructive pulmonary disease, unspecified: Secondary | ICD-10-CM

## 2018-08-15 DIAGNOSIS — D696 Thrombocytopenia, unspecified: Secondary | ICD-10-CM

## 2018-08-15 DIAGNOSIS — J9611 Chronic respiratory failure with hypoxia: Secondary | ICD-10-CM

## 2018-08-15 DIAGNOSIS — J9612 Chronic respiratory failure with hypercapnia: Secondary | ICD-10-CM | POA: Diagnosis not present

## 2018-08-15 DIAGNOSIS — S71109A Unspecified open wound, unspecified thigh, initial encounter: Secondary | ICD-10-CM | POA: Insufficient documentation

## 2018-08-15 DIAGNOSIS — I5033 Acute on chronic diastolic (congestive) heart failure: Secondary | ICD-10-CM

## 2018-08-15 NOTE — Progress Notes (Signed)
Virtual Visit via Telephone Note I connected with Renee Noble on 08/15/18 at 11:00 AM EDT by telephone and verified that I am speaking with the correct person using two identifiers.   I discussed the limitations, risks, security and privacy concerns of performing an evaluation and management service by telephone and the availability of in person appointments. I also discussed with the patient that there may be a patient responsible charge related to this service. The patient expressed understanding and agreed to proceed.  History of Present Illness: Patient presents today via virtual visit to reassess wound of left upper thigh and reassessment of her weight, edema, breathing and oxygenation status. At last visit patient was seen for hospitalization follow-up for CHF. Was scheduled acutely with Cardiology and was to see them on 08/11/2018. Per EMR review patient canceled due to coronavirus and rescheduled for mid April. Patient endorses taking all medications as directed. Is stable on 3.5 L O2 at rest and with exertion. Denies any increase in respiratory secretions or change in coloration of chronic phlegm. Denies fever, chills or chest pain. Is checking weight daily first thing in the morning and is stable. Notes legs are doing very well currently. Denies racing heart.   In regards to wound, RN is coming out 3 x week. She notes is healing well and pain is reducing. Is hard to get comfortable sometimes just due to location or sore. [facility] RN sent a picture of wound yesterday which we will try to upload into chart.   Observations/Objective: Well-developed, obese.  Non-labored breathing on phone interview.    Assessment and Plan: 1. Chronic respiratory failure with hypoxia and hypercapnia (HCC) Home O2 stats stable. Non-labored breathing. Continue Home O2 and chronic medications per Pulmonology. Follow-up as scheduled.  2. COPD GOLDIII with min reversibility  Stable. Continue management per  Pulmonology.  3. Acute on chronic diastolic CHF (congestive heart failure) (HCC) Weights stable. Denies increased swelling. Continue Torsemide, daily weights and strict monitoring of I/O. Follow-up with Cardiology as scheduled.  4. Thrombocytopenia ([place]) Referral placed again to Hematology for assessment.   5. Wound of thigh Healing well per RN notes, patient comment and picture provided. Continue wound care per [facility]. RN to send weekly updates.   Follow Up Instructions: Follow-up with Cardiology and Nephrology as scheduled. Follow-up with me in 1 month. Will continue to review wound care notes and pictures to ensure healing.   I discussed the assessment and treatment plan with the patient. The patient was provided an opportunity to ask questions and all were answered. The patient agreed with the plan and demonstrated an understanding of the instructions.   The patient was advised to call back or seek an in-person evaluation if the symptoms worsen or if the condition fails to improve as anticipated.  I provided 25 minutes of non-face-to-face time during this encounter.   [name], PA-C

## 2018-08-16 DIAGNOSIS — I471 Supraventricular tachycardia: Secondary | ICD-10-CM | POA: Diagnosis not present

## 2018-08-16 DIAGNOSIS — M797 Fibromyalgia: Secondary | ICD-10-CM | POA: Diagnosis not present

## 2018-08-16 DIAGNOSIS — E1122 Type 2 diabetes mellitus with diabetic chronic kidney disease: Secondary | ICD-10-CM | POA: Diagnosis not present

## 2018-08-16 DIAGNOSIS — J9621 Acute and chronic respiratory failure with hypoxia: Secondary | ICD-10-CM | POA: Diagnosis not present

## 2018-08-16 DIAGNOSIS — I5033 Acute on chronic diastolic (congestive) heart failure: Secondary | ICD-10-CM | POA: Diagnosis not present

## 2018-08-16 DIAGNOSIS — E1140 Type 2 diabetes mellitus with diabetic neuropathy, unspecified: Secondary | ICD-10-CM | POA: Diagnosis not present

## 2018-08-16 DIAGNOSIS — I2781 Cor pulmonale (chronic): Secondary | ICD-10-CM | POA: Diagnosis not present

## 2018-08-16 DIAGNOSIS — E1151 Type 2 diabetes mellitus with diabetic peripheral angiopathy without gangrene: Secondary | ICD-10-CM | POA: Diagnosis not present

## 2018-08-16 DIAGNOSIS — E1143 Type 2 diabetes mellitus with diabetic autonomic (poly)neuropathy: Secondary | ICD-10-CM | POA: Diagnosis not present

## 2018-08-16 DIAGNOSIS — G894 Chronic pain syndrome: Secondary | ICD-10-CM | POA: Diagnosis not present

## 2018-08-16 DIAGNOSIS — I872 Venous insufficiency (chronic) (peripheral): Secondary | ICD-10-CM | POA: Diagnosis not present

## 2018-08-16 DIAGNOSIS — I130 Hypertensive heart and chronic kidney disease with heart failure and stage 1 through stage 4 chronic kidney disease, or unspecified chronic kidney disease: Secondary | ICD-10-CM | POA: Diagnosis not present

## 2018-08-16 DIAGNOSIS — K3184 Gastroparesis: Secondary | ICD-10-CM | POA: Diagnosis not present

## 2018-08-16 DIAGNOSIS — Z9981 Dependence on supplemental oxygen: Secondary | ICD-10-CM | POA: Diagnosis not present

## 2018-08-16 DIAGNOSIS — N183 Chronic kidney disease, stage 3 (moderate): Secondary | ICD-10-CM | POA: Diagnosis not present

## 2018-08-16 DIAGNOSIS — G4733 Obstructive sleep apnea (adult) (pediatric): Secondary | ICD-10-CM | POA: Diagnosis not present

## 2018-08-16 DIAGNOSIS — J439 Emphysema, unspecified: Secondary | ICD-10-CM | POA: Diagnosis not present

## 2018-08-16 DIAGNOSIS — G2581 Restless legs syndrome: Secondary | ICD-10-CM | POA: Diagnosis not present

## 2018-08-16 DIAGNOSIS — E039 Hypothyroidism, unspecified: Secondary | ICD-10-CM | POA: Diagnosis not present

## 2018-08-16 DIAGNOSIS — M1991 Primary osteoarthritis, unspecified site: Secondary | ICD-10-CM | POA: Diagnosis not present

## 2018-08-16 DIAGNOSIS — J9622 Acute and chronic respiratory failure with hypercapnia: Secondary | ICD-10-CM | POA: Diagnosis not present

## 2018-08-18 ENCOUNTER — Telehealth: Payer: Self-pay | Admitting: Physician Assistant

## 2018-08-18 NOTE — Telephone Encounter (Signed)
Copied from [place] [id number]. Topic: Quick Communication - Home Health Verbal Orders >> Aug 18, 2018  2:21 PM [name] wrote: Caller/Agency: Levada Dy RN with [facility] Number: [phone], secure VM if no answer Requesting OT/PT/Skilled Nursing/Social Work/Speech Therapy: SN Frequency: requesting VO for SN to be continued 3x week 3 weeks, 2x week 3 weeks, 1x week 3 weeks (wound right buttock & medical/medication mgmt)

## 2018-08-18 NOTE — Telephone Encounter (Signed)
Verbal orders left on VM for Renee Noble at [facility] for skilled nursing to continue care for wound of right buttock

## 2018-08-21 DIAGNOSIS — K3184 Gastroparesis: Secondary | ICD-10-CM | POA: Diagnosis not present

## 2018-08-21 DIAGNOSIS — I872 Venous insufficiency (chronic) (peripheral): Secondary | ICD-10-CM | POA: Diagnosis not present

## 2018-08-21 DIAGNOSIS — G2581 Restless legs syndrome: Secondary | ICD-10-CM | POA: Diagnosis not present

## 2018-08-21 DIAGNOSIS — I5033 Acute on chronic diastolic (congestive) heart failure: Secondary | ICD-10-CM | POA: Diagnosis not present

## 2018-08-21 DIAGNOSIS — E1122 Type 2 diabetes mellitus with diabetic chronic kidney disease: Secondary | ICD-10-CM | POA: Diagnosis not present

## 2018-08-21 DIAGNOSIS — G4733 Obstructive sleep apnea (adult) (pediatric): Secondary | ICD-10-CM | POA: Diagnosis not present

## 2018-08-21 DIAGNOSIS — L97212 Non-pressure chronic ulcer of right calf with fat layer exposed: Secondary | ICD-10-CM | POA: Diagnosis not present

## 2018-08-21 DIAGNOSIS — J439 Emphysema, unspecified: Secondary | ICD-10-CM | POA: Diagnosis not present

## 2018-08-21 DIAGNOSIS — L89322 Pressure ulcer of left buttock, stage 2: Secondary | ICD-10-CM | POA: Diagnosis not present

## 2018-08-21 DIAGNOSIS — J9622 Acute and chronic respiratory failure with hypercapnia: Secondary | ICD-10-CM | POA: Diagnosis not present

## 2018-08-21 DIAGNOSIS — E039 Hypothyroidism, unspecified: Secondary | ICD-10-CM | POA: Diagnosis not present

## 2018-08-21 DIAGNOSIS — E1143 Type 2 diabetes mellitus with diabetic autonomic (poly)neuropathy: Secondary | ICD-10-CM | POA: Diagnosis not present

## 2018-08-21 DIAGNOSIS — I130 Hypertensive heart and chronic kidney disease with heart failure and stage 1 through stage 4 chronic kidney disease, or unspecified chronic kidney disease: Secondary | ICD-10-CM | POA: Diagnosis not present

## 2018-08-21 DIAGNOSIS — M797 Fibromyalgia: Secondary | ICD-10-CM | POA: Diagnosis not present

## 2018-08-21 DIAGNOSIS — G894 Chronic pain syndrome: Secondary | ICD-10-CM | POA: Diagnosis not present

## 2018-08-21 DIAGNOSIS — I2781 Cor pulmonale (chronic): Secondary | ICD-10-CM | POA: Diagnosis not present

## 2018-08-21 DIAGNOSIS — E1140 Type 2 diabetes mellitus with diabetic neuropathy, unspecified: Secondary | ICD-10-CM | POA: Diagnosis not present

## 2018-08-21 DIAGNOSIS — E1151 Type 2 diabetes mellitus with diabetic peripheral angiopathy without gangrene: Secondary | ICD-10-CM | POA: Diagnosis not present

## 2018-08-21 DIAGNOSIS — J9621 Acute and chronic respiratory failure with hypoxia: Secondary | ICD-10-CM | POA: Diagnosis not present

## 2018-08-21 DIAGNOSIS — N183 Chronic kidney disease, stage 3 (moderate): Secondary | ICD-10-CM | POA: Diagnosis not present

## 2018-08-21 DIAGNOSIS — M1991 Primary osteoarthritis, unspecified site: Secondary | ICD-10-CM | POA: Diagnosis not present

## 2018-08-21 DIAGNOSIS — I471 Supraventricular tachycardia: Secondary | ICD-10-CM | POA: Diagnosis not present

## 2018-08-23 DIAGNOSIS — J439 Emphysema, unspecified: Secondary | ICD-10-CM | POA: Diagnosis not present

## 2018-08-23 DIAGNOSIS — G4733 Obstructive sleep apnea (adult) (pediatric): Secondary | ICD-10-CM | POA: Diagnosis not present

## 2018-08-23 DIAGNOSIS — I872 Venous insufficiency (chronic) (peripheral): Secondary | ICD-10-CM | POA: Diagnosis not present

## 2018-08-23 DIAGNOSIS — E1122 Type 2 diabetes mellitus with diabetic chronic kidney disease: Secondary | ICD-10-CM | POA: Diagnosis not present

## 2018-08-23 DIAGNOSIS — L89322 Pressure ulcer of left buttock, stage 2: Secondary | ICD-10-CM | POA: Diagnosis not present

## 2018-08-23 DIAGNOSIS — I2781 Cor pulmonale (chronic): Secondary | ICD-10-CM | POA: Diagnosis not present

## 2018-08-23 DIAGNOSIS — E1143 Type 2 diabetes mellitus with diabetic autonomic (poly)neuropathy: Secondary | ICD-10-CM | POA: Diagnosis not present

## 2018-08-23 DIAGNOSIS — E039 Hypothyroidism, unspecified: Secondary | ICD-10-CM | POA: Diagnosis not present

## 2018-08-23 DIAGNOSIS — M1991 Primary osteoarthritis, unspecified site: Secondary | ICD-10-CM | POA: Diagnosis not present

## 2018-08-23 DIAGNOSIS — N183 Chronic kidney disease, stage 3 (moderate): Secondary | ICD-10-CM | POA: Diagnosis not present

## 2018-08-23 DIAGNOSIS — I5033 Acute on chronic diastolic (congestive) heart failure: Secondary | ICD-10-CM | POA: Diagnosis not present

## 2018-08-23 DIAGNOSIS — I471 Supraventricular tachycardia: Secondary | ICD-10-CM | POA: Diagnosis not present

## 2018-08-23 DIAGNOSIS — L97212 Non-pressure chronic ulcer of right calf with fat layer exposed: Secondary | ICD-10-CM | POA: Diagnosis not present

## 2018-08-23 DIAGNOSIS — K3184 Gastroparesis: Secondary | ICD-10-CM | POA: Diagnosis not present

## 2018-08-23 DIAGNOSIS — E1151 Type 2 diabetes mellitus with diabetic peripheral angiopathy without gangrene: Secondary | ICD-10-CM | POA: Diagnosis not present

## 2018-08-23 DIAGNOSIS — J9622 Acute and chronic respiratory failure with hypercapnia: Secondary | ICD-10-CM | POA: Diagnosis not present

## 2018-08-23 DIAGNOSIS — E1140 Type 2 diabetes mellitus with diabetic neuropathy, unspecified: Secondary | ICD-10-CM | POA: Diagnosis not present

## 2018-08-23 DIAGNOSIS — J9621 Acute and chronic respiratory failure with hypoxia: Secondary | ICD-10-CM | POA: Diagnosis not present

## 2018-08-23 DIAGNOSIS — G894 Chronic pain syndrome: Secondary | ICD-10-CM | POA: Diagnosis not present

## 2018-08-23 DIAGNOSIS — G2581 Restless legs syndrome: Secondary | ICD-10-CM | POA: Diagnosis not present

## 2018-08-23 DIAGNOSIS — I130 Hypertensive heart and chronic kidney disease with heart failure and stage 1 through stage 4 chronic kidney disease, or unspecified chronic kidney disease: Secondary | ICD-10-CM | POA: Diagnosis not present

## 2018-08-23 DIAGNOSIS — M797 Fibromyalgia: Secondary | ICD-10-CM | POA: Diagnosis not present

## 2018-08-24 ENCOUNTER — Telehealth: Payer: Self-pay | Admitting: Physician Assistant

## 2018-08-24 NOTE — Telephone Encounter (Signed)
I have placed a HH Cert. And plan of care in the bin upfront.

## 2018-08-24 NOTE — Telephone Encounter (Signed)
Paperwork in your folder for review 

## 2018-08-25 DIAGNOSIS — N183 Chronic kidney disease, stage 3 (moderate): Secondary | ICD-10-CM | POA: Diagnosis not present

## 2018-08-25 DIAGNOSIS — L97212 Non-pressure chronic ulcer of right calf with fat layer exposed: Secondary | ICD-10-CM | POA: Diagnosis not present

## 2018-08-25 DIAGNOSIS — L89322 Pressure ulcer of left buttock, stage 2: Secondary | ICD-10-CM | POA: Diagnosis not present

## 2018-08-25 DIAGNOSIS — G2581 Restless legs syndrome: Secondary | ICD-10-CM | POA: Diagnosis not present

## 2018-08-25 DIAGNOSIS — E1122 Type 2 diabetes mellitus with diabetic chronic kidney disease: Secondary | ICD-10-CM | POA: Diagnosis not present

## 2018-08-25 DIAGNOSIS — E1140 Type 2 diabetes mellitus with diabetic neuropathy, unspecified: Secondary | ICD-10-CM | POA: Diagnosis not present

## 2018-08-25 DIAGNOSIS — K3184 Gastroparesis: Secondary | ICD-10-CM | POA: Diagnosis not present

## 2018-08-25 DIAGNOSIS — E1143 Type 2 diabetes mellitus with diabetic autonomic (poly)neuropathy: Secondary | ICD-10-CM | POA: Diagnosis not present

## 2018-08-25 DIAGNOSIS — I5033 Acute on chronic diastolic (congestive) heart failure: Secondary | ICD-10-CM | POA: Diagnosis not present

## 2018-08-25 DIAGNOSIS — I130 Hypertensive heart and chronic kidney disease with heart failure and stage 1 through stage 4 chronic kidney disease, or unspecified chronic kidney disease: Secondary | ICD-10-CM | POA: Diagnosis not present

## 2018-08-25 DIAGNOSIS — G4733 Obstructive sleep apnea (adult) (pediatric): Secondary | ICD-10-CM | POA: Diagnosis not present

## 2018-08-25 DIAGNOSIS — J439 Emphysema, unspecified: Secondary | ICD-10-CM | POA: Diagnosis not present

## 2018-08-25 DIAGNOSIS — M797 Fibromyalgia: Secondary | ICD-10-CM | POA: Diagnosis not present

## 2018-08-25 DIAGNOSIS — I872 Venous insufficiency (chronic) (peripheral): Secondary | ICD-10-CM | POA: Diagnosis not present

## 2018-08-25 DIAGNOSIS — I2781 Cor pulmonale (chronic): Secondary | ICD-10-CM | POA: Diagnosis not present

## 2018-08-25 DIAGNOSIS — J9621 Acute and chronic respiratory failure with hypoxia: Secondary | ICD-10-CM | POA: Diagnosis not present

## 2018-08-25 DIAGNOSIS — J9622 Acute and chronic respiratory failure with hypercapnia: Secondary | ICD-10-CM | POA: Diagnosis not present

## 2018-08-25 DIAGNOSIS — E039 Hypothyroidism, unspecified: Secondary | ICD-10-CM | POA: Diagnosis not present

## 2018-08-25 DIAGNOSIS — M1991 Primary osteoarthritis, unspecified site: Secondary | ICD-10-CM | POA: Diagnosis not present

## 2018-08-25 DIAGNOSIS — I471 Supraventricular tachycardia: Secondary | ICD-10-CM | POA: Diagnosis not present

## 2018-08-25 DIAGNOSIS — E1151 Type 2 diabetes mellitus with diabetic peripheral angiopathy without gangrene: Secondary | ICD-10-CM | POA: Diagnosis not present

## 2018-08-25 DIAGNOSIS — G894 Chronic pain syndrome: Secondary | ICD-10-CM | POA: Diagnosis not present

## 2018-08-28 ENCOUNTER — Inpatient Hospital Stay (HOSPITAL_COMMUNITY)
Admission: EM | Admit: 2018-08-28 | Discharge: 2018-09-16 | DRG: 208 | Disposition: A | Discharge status: Skilled Nursing Facility | Payer: Medicare Other | Attending: Family Medicine | Admitting: Family Medicine

## 2018-08-28 ENCOUNTER — Encounter (HOSPITAL_COMMUNITY): Payer: Self-pay | Admitting: *Deleted

## 2018-08-28 ENCOUNTER — Telehealth: Payer: Self-pay | Admitting: Physician Assistant

## 2018-08-28 ENCOUNTER — Emergency Department (HOSPITAL_COMMUNITY): Payer: Medicare Other

## 2018-08-28 DIAGNOSIS — Z4659 Encounter for fitting and adjustment of other gastrointestinal appliance and device: Secondary | ICD-10-CM | POA: Diagnosis not present

## 2018-08-28 DIAGNOSIS — F329 Major depressive disorder, single episode, unspecified: Secondary | ICD-10-CM

## 2018-08-28 DIAGNOSIS — T501X5A Adverse effect of loop [high-ceiling] diuretics, initial encounter: Secondary | ICD-10-CM | POA: Diagnosis not present

## 2018-08-28 DIAGNOSIS — Z930 Tracheostomy status: Secondary | ICD-10-CM | POA: Diagnosis not present

## 2018-08-28 DIAGNOSIS — E1122 Type 2 diabetes mellitus with diabetic chronic kidney disease: Secondary | ICD-10-CM | POA: Diagnosis present

## 2018-08-28 DIAGNOSIS — N179 Acute kidney failure, unspecified: Secondary | ICD-10-CM | POA: Diagnosis not present

## 2018-08-28 DIAGNOSIS — Z9981 Dependence on supplemental oxygen: Secondary | ICD-10-CM

## 2018-08-28 DIAGNOSIS — I5033 Acute on chronic diastolic (congestive) heart failure: Secondary | ICD-10-CM | POA: Diagnosis present

## 2018-08-28 DIAGNOSIS — N183 Chronic kidney disease, stage 3 (moderate): Secondary | ICD-10-CM | POA: Diagnosis not present

## 2018-08-28 DIAGNOSIS — R52 Pain, unspecified: Secondary | ICD-10-CM | POA: Diagnosis not present

## 2018-08-28 DIAGNOSIS — T380X5A Adverse effect of glucocorticoids and synthetic analogues, initial encounter: Secondary | ICD-10-CM | POA: Diagnosis present

## 2018-08-28 DIAGNOSIS — J449 Chronic obstructive pulmonary disease, unspecified: Secondary | ICD-10-CM | POA: Diagnosis not present

## 2018-08-28 DIAGNOSIS — Z8249 Family history of ischemic heart disease and other diseases of the circulatory system: Secondary | ICD-10-CM

## 2018-08-28 DIAGNOSIS — K5900 Constipation, unspecified: Secondary | ICD-10-CM | POA: Diagnosis present

## 2018-08-28 DIAGNOSIS — M1991 Primary osteoarthritis, unspecified site: Secondary | ICD-10-CM

## 2018-08-28 DIAGNOSIS — E039 Hypothyroidism, unspecified: Secondary | ICD-10-CM | POA: Diagnosis not present

## 2018-08-28 DIAGNOSIS — Z79891 Long term (current) use of opiate analgesic: Secondary | ICD-10-CM

## 2018-08-28 DIAGNOSIS — Z825 Family history of asthma and other chronic lower respiratory diseases: Secondary | ICD-10-CM

## 2018-08-28 DIAGNOSIS — J9611 Chronic respiratory failure with hypoxia: Secondary | ICD-10-CM | POA: Diagnosis not present

## 2018-08-28 DIAGNOSIS — I480 Paroxysmal atrial fibrillation: Secondary | ICD-10-CM | POA: Diagnosis present

## 2018-08-28 DIAGNOSIS — Z9111 Patient's noncompliance with dietary regimen: Secondary | ICD-10-CM

## 2018-08-28 DIAGNOSIS — Z9119 Patient's noncompliance with other medical treatment and regimen: Secondary | ICD-10-CM | POA: Diagnosis not present

## 2018-08-28 DIAGNOSIS — R069 Unspecified abnormalities of breathing: Secondary | ICD-10-CM | POA: Diagnosis not present

## 2018-08-28 DIAGNOSIS — E871 Hypo-osmolality and hyponatremia: Secondary | ICD-10-CM | POA: Diagnosis not present

## 2018-08-28 DIAGNOSIS — Z515 Encounter for palliative care: Secondary | ICD-10-CM | POA: Diagnosis not present

## 2018-08-28 DIAGNOSIS — Z20828 Contact with and (suspected) exposure to other viral communicable diseases: Secondary | ICD-10-CM | POA: Diagnosis present

## 2018-08-28 DIAGNOSIS — E8881 Metabolic syndrome: Secondary | ICD-10-CM | POA: Diagnosis present

## 2018-08-28 DIAGNOSIS — Z931 Gastrostomy status: Secondary | ICD-10-CM | POA: Diagnosis not present

## 2018-08-28 DIAGNOSIS — I4891 Unspecified atrial fibrillation: Secondary | ICD-10-CM | POA: Diagnosis not present

## 2018-08-28 DIAGNOSIS — Z905 Acquired absence of kidney: Secondary | ICD-10-CM | POA: Diagnosis not present

## 2018-08-28 DIAGNOSIS — E1129 Type 2 diabetes mellitus with other diabetic kidney complication: Secondary | ICD-10-CM | POA: Diagnosis not present

## 2018-08-28 DIAGNOSIS — Z638 Other specified problems related to primary support group: Secondary | ICD-10-CM

## 2018-08-28 DIAGNOSIS — J439 Emphysema, unspecified: Secondary | ICD-10-CM | POA: Diagnosis not present

## 2018-08-28 DIAGNOSIS — K9423 Gastrostomy malfunction: Secondary | ICD-10-CM | POA: Diagnosis not present

## 2018-08-28 DIAGNOSIS — E1140 Type 2 diabetes mellitus with diabetic neuropathy, unspecified: Secondary | ICD-10-CM | POA: Diagnosis not present

## 2018-08-28 DIAGNOSIS — G9341 Metabolic encephalopathy: Secondary | ICD-10-CM | POA: Diagnosis not present

## 2018-08-28 DIAGNOSIS — Z781 Physical restraint status: Secondary | ICD-10-CM

## 2018-08-28 DIAGNOSIS — L89322 Pressure ulcer of left buttock, stage 2: Secondary | ICD-10-CM | POA: Diagnosis not present

## 2018-08-28 DIAGNOSIS — Z7189 Other specified counseling: Secondary | ICD-10-CM | POA: Diagnosis not present

## 2018-08-28 DIAGNOSIS — L8990 Pressure ulcer of unspecified site, unspecified stage: Secondary | ICD-10-CM | POA: Diagnosis present

## 2018-08-28 DIAGNOSIS — Z7951 Long term (current) use of inhaled steroids: Secondary | ICD-10-CM

## 2018-08-28 DIAGNOSIS — E1143 Type 2 diabetes mellitus with diabetic autonomic (poly)neuropathy: Secondary | ICD-10-CM | POA: Diagnosis not present

## 2018-08-28 DIAGNOSIS — G894 Chronic pain syndrome: Secondary | ICD-10-CM | POA: Diagnosis not present

## 2018-08-28 DIAGNOSIS — R0603 Acute respiratory distress: Secondary | ICD-10-CM

## 2018-08-28 DIAGNOSIS — I110 Hypertensive heart disease with heart failure: Secondary | ICD-10-CM | POA: Diagnosis not present

## 2018-08-28 DIAGNOSIS — Z978 Presence of other specified devices: Secondary | ICD-10-CM | POA: Diagnosis not present

## 2018-08-28 DIAGNOSIS — I471 Supraventricular tachycardia: Secondary | ICD-10-CM

## 2018-08-28 DIAGNOSIS — G4733 Obstructive sleep apnea (adult) (pediatric): Secondary | ICD-10-CM | POA: Diagnosis not present

## 2018-08-28 DIAGNOSIS — Z87891 Personal history of nicotine dependence: Secondary | ICD-10-CM

## 2018-08-28 DIAGNOSIS — R609 Edema, unspecified: Secondary | ICD-10-CM | POA: Diagnosis not present

## 2018-08-28 DIAGNOSIS — R911 Solitary pulmonary nodule: Secondary | ICD-10-CM | POA: Diagnosis present

## 2018-08-28 DIAGNOSIS — E876 Hypokalemia: Secondary | ICD-10-CM | POA: Diagnosis not present

## 2018-08-28 DIAGNOSIS — Z7901 Long term (current) use of anticoagulants: Secondary | ICD-10-CM

## 2018-08-28 DIAGNOSIS — E875 Hyperkalemia: Secondary | ICD-10-CM | POA: Diagnosis not present

## 2018-08-28 DIAGNOSIS — Z7401 Bed confinement status: Secondary | ICD-10-CM | POA: Diagnosis not present

## 2018-08-28 DIAGNOSIS — J441 Chronic obstructive pulmonary disease with (acute) exacerbation: Secondary | ICD-10-CM | POA: Diagnosis not present

## 2018-08-28 DIAGNOSIS — J9622 Acute and chronic respiratory failure with hypercapnia: Secondary | ICD-10-CM | POA: Diagnosis not present

## 2018-08-28 DIAGNOSIS — J9621 Acute and chronic respiratory failure with hypoxia: Principal | ICD-10-CM | POA: Diagnosis present

## 2018-08-28 DIAGNOSIS — Z7952 Long term (current) use of systemic steroids: Secondary | ICD-10-CM

## 2018-08-28 DIAGNOSIS — R0902 Hypoxemia: Secondary | ICD-10-CM | POA: Diagnosis not present

## 2018-08-28 DIAGNOSIS — I4892 Unspecified atrial flutter: Secondary | ICD-10-CM | POA: Diagnosis present

## 2018-08-28 DIAGNOSIS — M797 Fibromyalgia: Secondary | ICD-10-CM | POA: Diagnosis not present

## 2018-08-28 DIAGNOSIS — I130 Hypertensive heart and chronic kidney disease with heart failure and stage 1 through stage 4 chronic kidney disease, or unspecified chronic kidney disease: Secondary | ICD-10-CM | POA: Diagnosis present

## 2018-08-28 DIAGNOSIS — R0602 Shortness of breath: Secondary | ICD-10-CM | POA: Diagnosis not present

## 2018-08-28 DIAGNOSIS — I509 Heart failure, unspecified: Secondary | ICD-10-CM | POA: Diagnosis not present

## 2018-08-28 DIAGNOSIS — E1151 Type 2 diabetes mellitus with diabetic peripheral angiopathy without gangrene: Secondary | ICD-10-CM | POA: Diagnosis not present

## 2018-08-28 DIAGNOSIS — R339 Retention of urine, unspecified: Secondary | ICD-10-CM | POA: Diagnosis present

## 2018-08-28 DIAGNOSIS — E662 Morbid (severe) obesity with alveolar hypoventilation: Secondary | ICD-10-CM | POA: Diagnosis present

## 2018-08-28 DIAGNOSIS — I10 Essential (primary) hypertension: Secondary | ICD-10-CM | POA: Diagnosis not present

## 2018-08-28 DIAGNOSIS — Z9181 History of falling: Secondary | ICD-10-CM

## 2018-08-28 DIAGNOSIS — Z95828 Presence of other vascular implants and grafts: Secondary | ICD-10-CM

## 2018-08-28 DIAGNOSIS — Z6841 Body Mass Index (BMI) 40.0 and over, adult: Secondary | ICD-10-CM | POA: Diagnosis not present

## 2018-08-28 DIAGNOSIS — E785 Hyperlipidemia, unspecified: Secondary | ICD-10-CM | POA: Diagnosis present

## 2018-08-28 DIAGNOSIS — Z85528 Personal history of other malignant neoplasm of kidney: Secondary | ICD-10-CM

## 2018-08-28 DIAGNOSIS — L89892 Pressure ulcer of other site, stage 2: Secondary | ICD-10-CM | POA: Diagnosis present

## 2018-08-28 DIAGNOSIS — I4430 Unspecified atrioventricular block: Secondary | ICD-10-CM | POA: Diagnosis present

## 2018-08-28 DIAGNOSIS — Z833 Family history of diabetes mellitus: Secondary | ICD-10-CM

## 2018-08-28 DIAGNOSIS — G2581 Restless legs syndrome: Secondary | ICD-10-CM

## 2018-08-28 DIAGNOSIS — Z452 Encounter for adjustment and management of vascular access device: Secondary | ICD-10-CM

## 2018-08-28 DIAGNOSIS — J984 Other disorders of lung: Secondary | ICD-10-CM | POA: Diagnosis not present

## 2018-08-28 DIAGNOSIS — Z794 Long term (current) use of insulin: Secondary | ICD-10-CM

## 2018-08-28 DIAGNOSIS — D6489 Other specified anemias: Secondary | ICD-10-CM | POA: Diagnosis not present

## 2018-08-28 DIAGNOSIS — K3184 Gastroparesis: Secondary | ICD-10-CM | POA: Diagnosis not present

## 2018-08-28 DIAGNOSIS — M6281 Muscle weakness (generalized): Secondary | ICD-10-CM | POA: Diagnosis not present

## 2018-08-28 DIAGNOSIS — I2781 Cor pulmonale (chronic): Secondary | ICD-10-CM | POA: Diagnosis not present

## 2018-08-28 DIAGNOSIS — I959 Hypotension, unspecified: Secondary | ICD-10-CM | POA: Diagnosis not present

## 2018-08-28 DIAGNOSIS — D6959 Other secondary thrombocytopenia: Secondary | ICD-10-CM | POA: Diagnosis present

## 2018-08-28 DIAGNOSIS — J811 Chronic pulmonary edema: Secondary | ICD-10-CM

## 2018-08-28 DIAGNOSIS — I872 Venous insufficiency (chronic) (peripheral): Secondary | ICD-10-CM | POA: Diagnosis not present

## 2018-08-28 DIAGNOSIS — J9612 Chronic respiratory failure with hypercapnia: Secondary | ICD-10-CM

## 2018-08-28 DIAGNOSIS — M2550 Pain in unspecified joint: Secondary | ICD-10-CM | POA: Diagnosis not present

## 2018-08-28 DIAGNOSIS — L97212 Non-pressure chronic ulcer of right calf with fat layer exposed: Secondary | ICD-10-CM | POA: Diagnosis not present

## 2018-08-28 DIAGNOSIS — D638 Anemia in other chronic diseases classified elsewhere: Secondary | ICD-10-CM | POA: Diagnosis present

## 2018-08-28 DIAGNOSIS — J90 Pleural effusion, not elsewhere classified: Secondary | ICD-10-CM | POA: Diagnosis not present

## 2018-08-28 DIAGNOSIS — R0689 Other abnormalities of breathing: Secondary | ICD-10-CM | POA: Diagnosis not present

## 2018-08-28 DIAGNOSIS — I878 Other specified disorders of veins: Secondary | ICD-10-CM | POA: Diagnosis present

## 2018-08-28 LAB — CBC WITH DIFFERENTIAL/PLATELET
Abs Immature Granulocytes: 0.03 10*3/uL (ref 0.00–0.07)
Basophils Absolute: 0 10*3/uL (ref 0.0–0.1)
Basophils Relative: 0 %
Eosinophils Absolute: 0 10*3/uL (ref 0.0–0.5)
Eosinophils Relative: 0 %
HCT: 34.2 % — ABNORMAL LOW (ref 36.0–46.0)
Hemoglobin: 9.2 g/dL — ABNORMAL LOW (ref 12.0–15.0)
Immature Granulocytes: 1 %
Lymphocytes Relative: 3 %
Lymphs Abs: 0.2 10*3/uL — ABNORMAL LOW (ref 0.7–4.0)
MCH: 23.7 pg — ABNORMAL LOW (ref 26.0–34.0)
MCHC: 26.9 g/dL — ABNORMAL LOW (ref 30.0–36.0)
MCV: 88.1 fL (ref 80.0–100.0)
Monocytes Absolute: 0.1 10*3/uL (ref 0.1–1.0)
Monocytes Relative: 2 %
Neutro Abs: 5.4 10*3/uL (ref 1.7–7.7)
Neutrophils Relative %: 94 %
Platelets: 66 10*3/uL — ABNORMAL LOW (ref 150–400)
RBC: 3.88 MIL/uL (ref 3.87–5.11)
RDW: 16.9 % — ABNORMAL HIGH (ref 11.5–15.5)
WBC: 5.7 10*3/uL (ref 4.0–10.5)
nRBC: 0 % (ref 0.0–0.2)

## 2018-08-28 LAB — BLOOD GAS, ARTERIAL
Acid-Base Excess: 12.4 mmol/L — ABNORMAL HIGH (ref 0.0–2.0)
Bicarbonate: 33.6 mmol/L — ABNORMAL HIGH (ref 20.0–28.0)
Drawn by: 21310
FIO2: 44
O2 Content: 6 L/min
O2 Saturation: 83.6 %
Patient temperature: 37
pCO2 arterial: 111 mmHg (ref 32.0–48.0)
pH, Arterial: 7.19 — CL (ref 7.350–7.450)
pO2, Arterial: 60.5 mmHg — ABNORMAL LOW (ref 83.0–108.0)

## 2018-08-28 LAB — COMPREHENSIVE METABOLIC PANEL
ALT: 10 U/L (ref 0–44)
AST: 16 U/L (ref 15–41)
Albumin: 4 g/dL (ref 3.5–5.0)
Alkaline Phosphatase: 62 U/L (ref 38–126)
Anion gap: 11 (ref 5–15)
BUN: 47 mg/dL — ABNORMAL HIGH (ref 8–23)
CO2: 39 mmol/L — ABNORMAL HIGH (ref 22–32)
Calcium: 8.5 mg/dL — ABNORMAL LOW (ref 8.9–10.3)
Chloride: 89 mmol/L — ABNORMAL LOW (ref 98–111)
Creatinine, Ser: 1.63 mg/dL — ABNORMAL HIGH (ref 0.44–1.00)
GFR calc Af Amer: 38 mL/min — ABNORMAL LOW (ref >60)
GFR calc non Af Amer: 33 mL/min — ABNORMAL LOW (ref >60)
Glucose, Bld: 153 mg/dL — ABNORMAL HIGH (ref 70–99)
Potassium: 4.5 mmol/L (ref 3.5–5.1)
Sodium: 139 mmol/L (ref 135–145)
Total Bilirubin: 1 mg/dL (ref 0.3–1.2)
Total Protein: 7 g/dL (ref 6.5–8.1)

## 2018-08-28 LAB — MAGNESIUM: Magnesium: 2.5 mg/dL — ABNORMAL HIGH (ref 1.7–2.4)

## 2018-08-28 LAB — BRAIN NATRIURETIC PEPTIDE: B Natriuretic Peptide: 858 pg/mL — ABNORMAL HIGH (ref 0.0–100.0)

## 2018-08-28 LAB — TROPONIN I: Troponin I: 0.03 ng/mL (ref <0.03)

## 2018-08-28 LAB — GLUCOSE, CAPILLARY: Glucose-Capillary: 136 mg/dL — ABNORMAL HIGH (ref 70–99)

## 2018-08-28 MED ORDER — LEVALBUTEROL HCL 0.63 MG/3ML IN NEBU
0.6300 mg | INHALATION_SOLUTION | Freq: Four times a day (QID) | RESPIRATORY_TRACT | Status: DC
  Administered 2018-08-28 – 2018-08-29 (×2): 0.63 mg via RESPIRATORY_TRACT
  Filled 2018-08-28 (×2): qty 3

## 2018-08-28 MED ORDER — INSULIN GLARGINE 100 UNIT/ML ~~LOC~~ SOLN
20.0000 [IU] | Freq: Every day | SUBCUTANEOUS | Status: DC
  Administered 2018-08-29 – 2018-08-31 (×3): 20 [IU] via SUBCUTANEOUS
  Filled 2018-08-28 (×4): qty 0.2

## 2018-08-28 MED ORDER — ACETAMINOPHEN 650 MG RE SUPP: 650.0000 mg | Freq: Four times a day (QID) | RECTAL | Status: DC | PRN

## 2018-08-28 MED ORDER — PRAVASTATIN SODIUM 20 MG PO TABS
20.0000 mg | ORAL_TABLET | Freq: Every evening | ORAL | Status: DC
  Administered 2018-08-28 – 2018-08-29 (×2): 20 mg via ORAL
  Filled 2018-08-28 (×2): qty 2
  Filled 2018-08-28 (×4): qty 1

## 2018-08-28 MED ORDER — METHYLPREDNISOLONE SODIUM SUCC 40 MG IJ SOLR
40.0000 mg | Freq: Four times a day (QID) | INTRAMUSCULAR | Status: DC
  Administered 2018-08-28 – 2018-08-31 (×10): 40 mg via INTRAVENOUS
  Filled 2018-08-28 (×11): qty 1

## 2018-08-28 MED ORDER — LORAZEPAM 2 MG/ML IJ SOLN
0.5000 mg | Freq: Once | INTRAMUSCULAR | Status: AC
  Administered 2018-08-29: 0.5 mg via INTRAVENOUS
  Filled 2018-08-28: qty 1

## 2018-08-28 MED ORDER — MORPHINE SULFATE ER 30 MG PO TBCR
30.0000 mg | EXTENDED_RELEASE_TABLET | Freq: Two times a day (BID) | ORAL | Status: DC
  Administered 2018-08-29 – 2018-08-30 (×3): 30 mg via ORAL
  Filled 2018-08-28 (×2): qty 1
  Filled 2018-08-28: qty 2

## 2018-08-28 MED ORDER — INSULIN ASPART 100 UNIT/ML ~~LOC~~ SOLN
0.0000 [IU] | Freq: Three times a day (TID) | SUBCUTANEOUS | Status: DC
  Administered 2018-08-29: 3 [IU] via SUBCUTANEOUS
  Administered 2018-08-29: 12:00:00 5 [IU] via SUBCUTANEOUS
  Administered 2018-08-29: 16:00:00 3 [IU] via SUBCUTANEOUS
  Administered 2018-08-30: 16:00:00 11 [IU] via SUBCUTANEOUS
  Administered 2018-08-30: 5 [IU] via SUBCUTANEOUS
  Administered 2018-08-30: 08:00:00 3 [IU] via SUBCUTANEOUS

## 2018-08-28 MED ORDER — DILTIAZEM HCL ER COATED BEADS 180 MG PO CP24: 180.0000 mg | ORAL_CAPSULE | Freq: Every day | ORAL | Status: DC

## 2018-08-28 MED ORDER — FUROSEMIDE 10 MG/ML IJ SOLN
60.0000 mg | Freq: Two times a day (BID) | INTRAMUSCULAR | Status: DC
  Administered 2018-08-28: 23:00:00 60 mg via INTRAVENOUS
  Filled 2018-08-28: qty 6

## 2018-08-28 MED ORDER — LEVALBUTEROL HCL 0.63 MG/3ML IN NEBU: 0.6300 mg | INHALATION_SOLUTION | RESPIRATORY_TRACT | Status: DC | PRN

## 2018-08-28 MED ORDER — DILTIAZEM LOAD VIA INFUSION
20.0000 mg | Freq: Once | INTRAVENOUS | Status: AC
  Administered 2018-08-28: 23:00:00 20 mg via INTRAVENOUS
  Filled 2018-08-28: qty 20

## 2018-08-28 MED ORDER — ACETAMINOPHEN 325 MG PO TABS: 650.0000 mg | ORAL_TABLET | Freq: Four times a day (QID) | ORAL | Status: DC | PRN

## 2018-08-28 MED ORDER — IPRATROPIUM BROMIDE 0.02 % IN SOLN
0.5000 mg | Freq: Four times a day (QID) | RESPIRATORY_TRACT | Status: DC
  Administered 2018-08-28 – 2018-08-29 (×2): 0.5 mg via RESPIRATORY_TRACT
  Filled 2018-08-28 (×2): qty 2.5

## 2018-08-28 MED ORDER — HEPARIN SODIUM (PORCINE) 5000 UNIT/ML IJ SOLN: 5000.0000 [IU] | Freq: Three times a day (TID) | INTRAMUSCULAR | Status: DC

## 2018-08-28 MED ORDER — DILTIAZEM HCL 100 MG IV SOLR
5.0000 mg/h | INTRAVENOUS | Status: DC
  Administered 2018-08-28: 23:00:00 5 mg/h via INTRAVENOUS
  Filled 2018-08-28: qty 100

## 2018-08-28 MED ORDER — LEVOTHYROXINE SODIUM 88 MCG PO TABS
88.0000 ug | ORAL_TABLET | Freq: Every day | ORAL | Status: DC
  Administered 2018-08-29 – 2018-08-30 (×2): 88 ug via ORAL
  Filled 2018-08-28 (×3): qty 1

## 2018-08-28 NOTE — ED Notes (Signed)
Date and time results received: 08/28/18 1929 (use smartphrase ".now" to insert current time)  Test: triponin Critical Value: 0.03  Name of Provider Notified: [email or web address],md  Orders Received? Or Actions Taken?: none

## 2018-08-28 NOTE — ED Provider Notes (Addendum)
[facility] EMERGENCY DEPARTMENT Provider Note   CSN: [id number] Arrival date & time: 08/28/18  1723    History   Chief Complaint No chief complaint on file.   HPI Renee Noble is a 63 y.o. female.     HPI Patient presents with shortness of breath over the last 4 days.  History of CHF and COPD.  On chronic oxygen at home.  On 2.5 L normally.  States with any exertion her saturations will drop into the 70s or 80s.  Baseline oxygenation is 95%.  When EMS arrived sats were in the 70s.  Given DuoNeb albuterol Solu-Medrol and magnesium.  States normally she is able to pursed lip breathing will go down.  States she is not able to do that now.  Feels it is more COPD but also could be carrying extra fluid.  States her weight is up around 3 pounds over the last day.  No chest pain.  No cough above her baseline cough. Past Medical History:  Diagnosis Date  . Abdominal mass of other site   . Cervical compression fracture ([place])   . CHF (congestive heart failure) ([place])   . Chronic kidney disease, stage 3 (HCC)    Borderline Stage 2-3  . Chronic lower limb pain   . COPD (chronic obstructive pulmonary disease) ([place])   . CTS (carpal tunnel syndrome)   . Depression   . Diabetes ([place])    Type II  . Fibromyalgia   . Hepatitis C    cured last year (2018)  . Hypercholesteremia   . Hypertension   . Hypothyroidism   . Morbid obesity ([place])   . Neuropathy    Diabetes  . OSA (obstructive sleep apnea)   . Osteoarthritis   . Renal cancer ([place])    Left Kidney Removed  . RLS (restless legs syndrome)   . Syncope   . Venous stasis     Patient Active Problem List   Diagnosis Date Noted  . Wound of thigh 08/15/2018  . Pressure injury of skin 07/17/2018  . Hypoglycemia due to insulin 07/14/2018  . Acute on chronic respiratory failure with hypoxia ([place]) 06/09/2018  . HLD (hyperlipidemia) 06/09/2018  . Hypothyroidism 06/09/2018  . Depression 06/09/2018  . Fibromyalgia   . Lung nodule  05/08/2018  . Morbid obesity with BMI of 50.0-59.9, adult ([place]) 04/10/2018  . Chronic pain 04/07/2018  . Acute on chronic diastolic CHF (congestive heart failure) ([place]) 04/07/2018  . PNA (pneumonia) 03/21/2018  . COPD (chronic obstructive pulmonary disease) ([place]) 02/25/2018  . Chronic pain syndrome   . Normocytic anemia 02/24/2018  . OSA (obstructive sleep apnea) 08/16/2017  . Morbid obesity due to excess calories ([place]) 04/29/2017  . Cor pulmonale (chronic) ([place]) 04/29/2017  . Chronic respiratory failure with hypoxia and hypercapnia ([place]) 04/28/2017  . CKD (chronic kidney disease), stage III ([place]) 04/14/2016  . Hyperlipidemia 01/16/2016  . Essential hypertension 01/16/2016  . Hypoxia   . Thrombocytopenia ([place]) 07/12/2015  . Thyroid activity decreased 06/23/2015  . Encounter for chronic pain management 09/02/2014  . Hernia of abdominal cavity 07/30/2014  . Abnormal CXR 02/15/2014  . COPD GOLDIII with min reversibility  12/16/2013  . History of hepatitis C 12/16/2013  . Breast cancer screening 12/16/2013  . Type II diabetes mellitus with renal manifestations ([place]) 12/16/2013  . Screening for osteoporosis 12/16/2013    Past Surgical History:  Procedure Laterality Date  . ABDOMINAL HERNIA REPAIR     x2  . ABDOMINAL HYSTERECTOMY    .  BLADDER SUSPENSION    . CHOLECYSTECTOMY    . CYST EXCISION     Head  . INCISIONAL HERNIA REPAIR    . KNEE ARTHROSCOPY     Bilateral  . NEPHRECTOMY     Left  . TONSILLECTOMY    . TOOTH EXTRACTION    . UMBILICAL HERNIA REPAIR    . VAGINA SURGERY    . WISDOM TOOTH EXTRACTION       OB History    Gravida  8   Para  1   Term  1   Preterm      AB  7   Living        SAB  7   TAB      Ectopic      Multiple      Live Births               Home Medications    Prior to Admission medications   Medication Sig Start Date End Date Taking? Authorizing Provider  albuterol (PROAIR HFA) 108 (90 Base) MCG/ACT inhaler Inhale 1-2  puffs into the lungs every 6 (six) hours as needed for wheezing or shortness of breath. 01/20/18   Tanda Rockers, MD  budesonide-formoterol (SYMBICORT) 160-4.5 MCG/ACT inhaler Inhale 2 puffs into the lungs 2 (two) times daily. 06/29/18   Tanda Rockers, MD  clotrimazole-betamethasone (LOTRISONE) cream Apply 1 application topically 2 (two) times daily as needed. Patient taking differently: Apply 1 application topically 2 (two) times daily as needed (yeast).  05/02/18   [name], PA-C  dextromethorphan-guaiFENesin [facility] DM) 30-600 MG 12hr tablet Take 1 tablet by mouth 2 (two) times daily as needed for cough. 06/15/18   Hosie Poisson, MD  diltiazem (CARDIZEM CD) 180 MG 24 hr capsule Take 1 capsule (180 mg total) by mouth daily. 06/02/18   [name], PA-C  glucose blood (ACCU-CHEK AVIVA) test strip Check blood sugars 4 times per day for diabetes. Dx:E11.29 07/24/18   [name], PA-C  insulin glargine (LANTUS) 100 UNIT/ML injection Inject 0.15 mLs (15 Units total) into the skin daily. 07/17/18   Manuella Ghazi, Pratik D, DO  insulin lispro (HUMALOG) 100 UNIT/ML injection Inject 0.15 mLs (15 Units total) into the skin 3 (three) times daily as needed for high blood sugar. Uses sliding scale. Sugar over 200 will give insulin. 07/14/18   [name], PA-C  ipratropium-albuterol (DUONEB) 0.5-2.5 (3) MG/3ML SOLN Take 3 mLs by nebulization 4 (four) times daily. Dx: J44.9 03/21/18   Parrett, Fonnie Mu, NP  levothyroxine (SYNTHROID, LEVOTHROID) 88 MCG tablet TAKE ONE TABLET BY MOUTH DAILY 07/20/18   [name], PA-C  morphine (MS CONTIN) 30 MG 12 hr tablet Take 30 mg by mouth every 12 (twelve) hours. 03/23/18   [provider]  Multiple Vitamins-Minerals (CENTRUM SILVER PO) Take by mouth.    [provider]  nortriptyline (PAMELOR) 25 MG capsule Take 1 capsule (25 mg total) by mouth at bedtime. 06/21/18   [name], PA-C  nystatin (MYCOSTATIN/NYSTOP) powder APPLY TOPICALLY  TWICE DAILY AS NEEDED FOR YEAST INFECTION Patient taking differently: Apply 1 g topically 2 (two) times daily as needed (yeast).  09/27/17   [name], PA-C  OXYGEN O2 3.5L at home.    [provider]  potassium chloride (K-DUR) 10 MEQ tablet Take 1 tablet (10 mEq total) by mouth daily for 30 days. 07/17/18 08/16/18  Manuella Ghazi, Pratik D, DO  pravastatin (PRAVACHOL) 20 MG tablet TAKE ONE  TABLET BY MOUTH EVERY DAY 05/05/18   [name], PA-C  Probiotic Product (PROBIOTIC PO) Take by mouth.    [provider]  SURE COMFORT INSULIN SYRINGE 30G X 1/2" 1 ML MISC USE FOUR (4) TIMES DAILY AS DIRECTED Patient taking differently: Inject 1 Syringe into the skin 4 (four) times daily.  08/31/16   [name], PA-C  torsemide (DEMADEX) 20 MG tablet Take 1 tablet (20 mg total) by mouth daily for 30 days. 07/17/18 08/16/18  Heath Lark D, DO    Family History Family History  Problem Relation Age of Onset  . Heart attack Mother 72       Deceased  . Heart disease Mother   . Emphysema Mother   . Alcoholism Mother   . COPD Father 40       Deceased  . Emphysema Father   . Alcoholism Father   . Esophageal varices Father   . Alcoholism Paternal Grandfather   . Diabetes Maternal Grandmother   . Heart disease Maternal Grandmother   . Lung cancer Maternal Grandfather   . Emphysema Maternal Grandfather   . Brain cancer Maternal Aunt   . Diabetes Sister   . Heart defect Sister   . Cancer Sister        was told her sister had cancer but beat it and dont know which one   . Heart defect Sister   . Obesity Son   . Breast cancer Maternal Aunt   . Colon cancer Neg Hx   . Esophageal cancer Neg Hx     Social History Social History   Tobacco Use  . Smoking status: Former Smoker    Packs/day: 2.50    Years: 45.00    Pack years: 112.50    Types: Cigarettes    Last attempt to quit: 07/21/2016    Years since quitting: 2.1  . Smokeless tobacco: Never Used  Substance Use Topics  .  Alcohol use: No  . Drug use: No     Allergies   Gabapentin; Lyrica [pregabalin]; Ketorolac tromethamine; and Lisinopril   Review of Systems Review of Systems  Constitutional: Negative for appetite change and fever.  Respiratory: Positive for shortness of breath.   Cardiovascular: Positive for leg swelling.  Gastrointestinal: Negative for abdominal pain.  Genitourinary: Negative for flank pain.  Musculoskeletal: Negative for back pain.  Skin: Positive for wound.       Chronic buttock wounds.  Neurological: Negative for weakness.  Psychiatric/Behavioral: Negative for confusion.     Physical Exam Updated Vital Signs BP 120/69   Pulse 66   Temp 98.6 F (37 C) (Oral)   Resp 19   Ht 5\' 4"  (1.626 m)   Wt 124.3 kg   SpO2 95%   BMI 47.03 kg/m   Physical Exam Vitals signs and nursing note reviewed.  Constitutional:      Appearance: She is obese.  HENT:     Head: Atraumatic.     Nose: No rhinorrhea.  Eyes:     Pupils: Pupils are equal, round, and reactive to light.  Cardiovascular:     Rate and Rhythm: Tachycardia present.  Pulmonary:     Comments: Diffuse harsh wheezes and prolonged expirations. Abdominal:     Tenderness: There is no abdominal tenderness.  Musculoskeletal:     Right lower leg: Edema present.     Left lower leg: Edema present.     Comments: Edema bilateral lower extremities with chronic venous changes also.  Skin:  General: Skin is warm.     Capillary Refill: Capillary refill takes less than 2 seconds.  Neurological:     Mental Status: She is oriented to person, place, and time.      ED Treatments / Results  Labs (all labs ordered are listed, but only abnormal results are displayed) Labs Reviewed  COMPREHENSIVE METABOLIC PANEL - Abnormal; Notable for the following components:      Result Value   Chloride 89 (*)    CO2 39 (*)    Glucose, Bld 153 (*)    BUN 47 (*)    Creatinine, Ser 1.63 (*)    Calcium 8.5 (*)    GFR calc non Af Amer  33 (*)    GFR calc Af Amer 38 (*)    All other components within normal limits  CBC WITH DIFFERENTIAL/PLATELET - Abnormal; Notable for the following components:   Hemoglobin 9.2 (*)    HCT 34.2 (*)    MCH 23.7 (*)    MCHC 26.9 (*)    RDW 16.9 (*)    Platelets 66 (*)    Lymphs Abs 0.2 (*)    All other components within normal limits  TROPONIN I - Abnormal; Notable for the following components:   Troponin I 0.03 (*)    All other components within normal limits  BRAIN NATRIURETIC PEPTIDE - Abnormal; Notable for the following components:   B Natriuretic Peptide 858.0 (*)    All other components within normal limits    EKG EKG Interpretation  Date/Time:  Monday August 28 2018 17:36:17 EDT Ventricular Rate:  146 PR Interval:    QRS Duration: 165 QT Interval:  350 QTC Calculation: 546 R Axis:   147 Text Interpretation:  Atrial flutter with predominant 2:1 AV block RBBB and LPFB Anterior infarct, old   Confirmed by Davonna Belling [phone]) on 08/28/2018 8:20:45 PM   EKG Interpretation  Date/Time:  Monday August 28 2018 20:11:29 EDT Ventricular Rate:  125 PR Interval:    QRS Duration: 109 QT Interval:  340 QTC Calculation: 512 R Axis:   118 Text Interpretation:  atrial flutter with rapid ventricular response Prolonged QT interval Confirmed by Davonna Belling [phone]) on 08/28/2018 8:23:07 PM     Flutter waves give the appearance of ST elevation.    Radiology Dg Chest Portable 1 View  Result Date: 08/28/2018 CLINICAL DATA:  63 year old female with a history of shortness of breath EXAM: PORTABLE CHEST 1 VIEW COMPARISON:  07/14/2018, 06/08/2018 FINDINGS: Cardiomediastinal silhouette unchanged in size and contour with cardiomegaly. Mixed interstitial and airspace opacities of the bilateral lungs, worse than the comparison. Retrocardiac opacity with obscuration of the left hemidiaphragm and blunting of left costophrenic angle. Partial obscuration of the right hemidiaphragm with  blunting of the costophrenic angle. IMPRESSION: Mixed interstitial and airspace opacities of the bilateral lungs, worse than the comparison, with the differential including multifocal infection and/or edema. Likely bibasilar pleural effusions and associated atelectasis/consolidation. Cardiomegaly Electronically Signed   By: Corrie Mckusick D.O.   On: 08/28/2018 18:38    Procedures Procedures (including critical care time)  Medications Ordered in ED Medications - No data to display   Initial Impression / Assessment and Plan / ED Course  I have reviewed the triage vital signs and the nursing notes.  Pertinent labs & imaging results that were available during my care of the patient were reviewed by me and considered in my medical decision making (see chart for details).        Presents  with shortness of breath.  Think likely combination of COPD CHF and actually appears to have a new onset atrial flutter with RVR.  The atrial flutter could be secondary to the respiratory status and CHF however.  Have been able to titrate oxygen from nonrebreather down to 6 L nasal cannula.  Will need rate control.  Chadsvasc score of at least 5 for age sex CHF hypertension and diabetes.  Will admit to hospitalist.  Infection felt less likely.  CRITICAL CARE Performed by: Davonna Belling Total critical care time: 30 minutes Critical care time was exclusive of separately billable procedures and treating other patients. Critical care was necessary to treat or prevent imminent or life-threatening deterioration. Critical care was time spent personally by me on the following activities: development of treatment plan with patient and/or surrogate as well as nursing, discussions with consultants, evaluation of patient's response to treatment, examination of patient, obtaining history from patient or surrogate, ordering and performing treatments and interventions, ordering and review of laboratory studies, ordering and  review of radiographic studies, pulse oximetry and re-evaluation of patient's condition.      Final Clinical Impressions(s) / ED Diagnoses   Final diagnoses:  Atrial flutter with rapid ventricular response ([place])  Acute on chronic congestive heart failure, unspecified heart failure type [facility])  Chronic obstructive pulmonary disease, unspecified COPD type [facility])    ED Discharge Orders    None       Davonna Belling, MD 08/28/18 2018    Davonna Belling, MD 08/28/18 2024

## 2018-08-28 NOTE — Progress Notes (Signed)
Pt c/o 8/10 back pain (chronic), takes Morphine po at home, was not ordered with medications. Dr. Velia Meyer paged to see if medication could be ordered Will continue to monitor pt

## 2018-08-28 NOTE — Telephone Encounter (Signed)
Paperwork reviewed and placed in supervising MD box for signature.

## 2018-08-28 NOTE — Telephone Encounter (Signed)
Form completed and placed in basket  

## 2018-08-28 NOTE — Progress Notes (Signed)
Dr.Howerter paged about pts HR sustaining 145, pt new admit to floor. No orders for medications. Waiting for call back/orders

## 2018-08-28 NOTE — ED Notes (Signed)
Pt states her family member she lives with was admitted to [facility] with pneumonia and tested for Covid 19 twice, which was negative. Confirmed with Dr Velia Meyer, pt ok to be admitted to [facility].

## 2018-08-28 NOTE — ED Notes (Signed)
ED Provider at bedside. 

## 2018-08-28 NOTE — H&P (Signed)
History and Physical    PLEASE NOTE THAT [employer]AGON DI[employer] SOFTWARE WAS USED IN THE CONSTRUCTION OF THIS NOTE.   [name]:[id number] DOB: [birth year] DOA: 08/28/2018  PCP: [name], PA-C Patient coming from: Home  I have personally briefly reviewed patient's old medical records in [place]  Chief Complaint: Shortness of breath  HPI: Renee Noble is a 63 y.o. female with medical history significant for chronic hypoxic respiratory failure on 3 L continuous nasal cannula, severe COPD, chronic diastolic heart failure, type 2 diabetes mellitus, morbid obesity, chronic thrombocytopenia, who is admitted to [facility] on 08/28/2018 with acute on chronic hypoxic respiratory failure in the setting of acute on chronic diastolic heart failure and acute COPD exacerbation after presenting from home to the [facility] emergency department complaining of shortness of breath.  The patient reports 3 to 4 days of progressive shortness of breath associated with orthopnea and 3 to 5 pound weight gain over the last week.  She reports chronic nonproductive cough, without any significant worsening thereof recently.  She also reports chronic edema in the bilateral lower extremities, and has not noticed any significant recent worsening in that respect either.  She notes associated expiratory wheezing over the last 2 to 3 days, but denies any associated chest pain, diaphoresis, palpitations, nausea, vomiting, dizziness, or rash.  Denies rhinitis, rhinorrhea, sore throat, or diarrhea.  Denies any associated subjective fever, chills, rigors, or generalized myalgias.  She reports that her cousin with whom she lives, was diagnosed with pneumonia within the last week, but has been tested twice for COVID-19, with negative results on both occasions.  She denies any known or suspected COVID-19, and denies any recent traveling.   She confirms baseline oxygen requirement of 3 L continuous nasal cannula  upon which her oxygen saturations via home pulse oximeter typically run in the low to mid 90s.  However, she has been noting pulse oximetry readings into the low to mid 80s over the last 2 to 3 days.  She reports good compliance with her home respiratory regimen, which includes Symbicort.  Additionally, in the setting of a history of chronic diastolic heart failure, the patient also reports good compliance with her home diuretic regimen, which consists of torsemide 20 mg p.o. daily.  Of note, most recent echocardiogram occurred in November 2019 and showed mild LVH, EF 60 to 65%, no focal wall motion abnormalities, moderately dilated bilateral atria, but was not deemed to be technically sufficient in order to evaluate diastolic function.  This afternoon, in the setting of progressive shortness of breath and oxygen saturations persistently in the high 70s/low 80s, patient contacted EMS, who reportedly also noted initial oxygen saturations in the high 70s.  EMS reportedly administered duo nebulizer treatment x1, Solu-Medrol, and IV magnesium.  The patient was then brought to [facility] emergency department for further evaluation and management of her shortness of breath and acute on chronic hypoxia.     ED Course: Vital signs in the emergency department were notable for the following: Temperature max 98.6; heart rate in the range of 110 - 140 bpm; blood pressure ranged from 120/96-130 6/75; respiratory rate 16-26.  Presenting oxygen saturation while on 10 L nonrebreather was noted to be 100%.  Subsequently, patient has been transitioned to nasal cannula, and has demonstrated ensuing oxygen saturations in the range of 90 to 95% on 5 to 6 L nasal cannula.  Labs performed in the ED were notable for the following: CMP  notable for sodium 139, potassium 4.5, chloride 89, bicarbonate 39, BUN 47, creatinine 1.63 relative to most recent prior value of 1.17 on 07/31/2018, glucose 153; BNP 858, which is the highest such  value I have seen for this patient in our EMR; most recent prior BNP was noted to be 581 on 07/14/2018.  CBC notable for white blood cell count of 5700.  One view CXR, per final radiology report showed bilateral interstitial/airspace opacities concerning for edema with suspected small bilateral pleural effusion in the absence of evidence of overt infiltrate.  EKG was suggestive of atrial flutter with RVR, ventricular rate 125, and a prolonged QTC of 512 ms. the patient received no IV fluids or medications while in the ED.  Subsequently, she was admitted to the stepdown unit for further evaluation management of her presenting acute on chronic hypoxic respiratory failure due to suspected acute on chronic diastolic heart failure as well as acute COPD exacerbation.    Review of Systems: As per HPI otherwise 10 point review of systems negative.   Past Medical History:  Diagnosis Date   Abdominal mass of other site    Cervical compression fracture (HCC)    CHF (congestive heart failure) (HCC)    Chronic kidney disease, stage 3 (HCC)    Borderline Stage 2-3   Chronic lower limb pain    COPD (chronic obstructive pulmonary disease) (HCC)    CTS (carpal tunnel syndrome)    Depression    Diabetes (HCC)    Type II   Fibromyalgia    Hepatitis C    cured last year (2018)   Hypercholesteremia    Hypertension    Hypothyroidism    Morbid obesity (HCC)    Neuropathy    Diabetes   OSA (obstructive sleep apnea)    Osteoarthritis    Renal cancer (HCC)    Left Kidney Removed   RLS (restless legs syndrome)    Syncope    Venous stasis     Past Surgical History:  Procedure Laterality Date   ABDOMINAL HERNIA REPAIR     x2   ABDOMINAL HYSTERECTOMY     BLADDER SUSPENSION     CHOLECYSTECTOMY     CYST EXCISION     Head   INCISIONAL HERNIA REPAIR     KNEE ARTHROSCOPY     Bilateral   NEPHRECTOMY     Left   TONSILLECTOMY     TOOTH EXTRACTION     UMBILICAL HERNIA  REPAIR     VAGINA SURGERY     WISDOM TOOTH EXTRACTION      Social History:  reports that she quit smoking about 2 years ago. Her smoking use included cigarettes. She has a 112.50 pack-year smoking history. She has never used smokeless tobacco. She reports that she does not drink alcohol or use drugs.   Allergies  Allergen Reactions   Gabapentin Anaphylaxis   Lyrica [Pregabalin] Shortness Of Breath    Trouble breathing   Ketorolac Tromethamine Hives   Lisinopril Cough    Family History  Problem Relation Age of Onset   Heart attack Mother 56       Deceased   Heart disease Mother    Emphysema Mother    Alcoholism Mother    COPD Father 15       Deceased   Emphysema Father    Alcoholism Father    Esophageal varices Father    Alcoholism Paternal Grandfather    Diabetes Maternal Grandmother    Heart disease Maternal Grandmother  Lung cancer Maternal Grandfather    Emphysema Maternal Grandfather    Brain cancer Maternal Aunt    Diabetes Sister    Heart defect Sister    Cancer Sister        was told her sister had cancer but beat it and dont know which one    Heart defect Sister    Obesity Son    Breast cancer Maternal Aunt    Colon cancer Neg Hx    Esophageal cancer Neg Hx      Prior to Admission medications   Medication Sig Start Date End Date Taking? Authorizing Provider  albuterol (PROAIR HFA) 108 (90 Base) MCG/ACT inhaler Inhale 1-2 puffs into the lungs every 6 (six) hours as needed for wheezing or shortness of breath. 01/20/18   Tanda Rockers, MD  budesonide-formoterol (SYMBICORT) 160-4.5 MCG/ACT inhaler Inhale 2 puffs into the lungs 2 (two) times daily. 06/29/18   Tanda Rockers, MD  clotrimazole-betamethasone (LOTRISONE) cream Apply 1 application topically 2 (two) times daily as needed. Patient taking differently: Apply 1 application topically 2 (two) times daily as needed (yeast).  05/02/18   [name], PA-C    dextromethorphan-guaiFENesin [facility] DM) 30-600 MG 12hr tablet Take 1 tablet by mouth 2 (two) times daily as needed for cough. 06/15/18   Hosie Poisson, MD  diltiazem (CARDIZEM CD) 180 MG 24 hr capsule Take 1 capsule (180 mg total) by mouth daily. 06/02/18   [name], PA-C  glucose blood (ACCU-CHEK AVIVA) test strip Check blood sugars 4 times per day for diabetes. Dx:E11.29 07/24/18   [name], PA-C  insulin glargine (LANTUS) 100 UNIT/ML injection Inject 0.15 mLs (15 Units total) into the skin daily. 07/17/18   Manuella Ghazi, Pratik D, DO  insulin lispro (HUMALOG) 100 UNIT/ML injection Inject 0.15 mLs (15 Units total) into the skin 3 (three) times daily as needed for high blood sugar. Uses sliding scale. Sugar over 200 will give insulin. 07/14/18   [name], PA-C  ipratropium-albuterol (DUONEB) 0.5-2.5 (3) MG/3ML SOLN Take 3 mLs by nebulization 4 (four) times daily. Dx: J44.9 03/21/18   Parrett, Fonnie Mu, NP  levothyroxine (SYNTHROID, LEVOTHROID) 88 MCG tablet TAKE ONE TABLET BY MOUTH DAILY 07/20/18   [name], PA-C  morphine (MS CONTIN) 30 MG 12 hr tablet Take 30 mg by mouth every 12 (twelve) hours. 03/23/18   [provider]  Multiple Vitamins-Minerals (CENTRUM SILVER PO) Take by mouth.    [provider]  nortriptyline (PAMELOR) 25 MG capsule Take 1 capsule (25 mg total) by mouth at bedtime. 06/21/18   [name], PA-C  nystatin (MYCOSTATIN/NYSTOP) powder APPLY TOPICALLY TWICE DAILY AS NEEDED FOR YEAST INFECTION Patient taking differently: Apply 1 g topically 2 (two) times daily as needed (yeast).  09/27/17   [name], PA-C  OXYGEN O2 3.5L at home.    [provider]  potassium chloride (K-DUR) 10 MEQ tablet Take 1 tablet (10 mEq total) by mouth daily for 30 days. 07/17/18 08/16/18  Manuella Ghazi, Pratik D, DO  pravastatin (PRAVACHOL) 20 MG tablet TAKE ONE TABLET BY MOUTH EVERY DAY 05/05/18   [name], PA-C  Probiotic Product (PROBIOTIC PO) Take  by mouth.    [provider]  SURE COMFORT INSULIN SYRINGE 30G X 1/2" 1 ML MISC USE FOUR (4) TIMES DAILY AS DIRECTED Patient taking differently: Inject 1 Syringe into the skin 4 (four) times daily.  08/31/16   [name], PA-C  torsemide [facility])  20 MG tablet Take 1 tablet (20 mg total) by mouth daily for 30 days. 07/17/18 08/16/18  Heath Lark D, DO     Objective     Physical Exam: Vitals:   08/28/18 1923 08/28/18 1924 08/28/18 1930 08/28/18 2000  BP:   (!) 136/102 120/69  Pulse: (!) 144 (!) 143 73 66  Resp: (!) 24 18 (!) 22 19  Temp:      TempSrc:      SpO2: 91% 92% 93% 95%  Weight:      Height:        General: appears to be stated age; alert, oriented Skin: warm, dry; hyperpigmentation associated with the bilateral lower extremities distal to the bilateral knees. Head:  AT/[place] Mouth:  Oral mucosa membranes appear moist, normal dentition Neck: supple; trachea midline Heart: Tachycardic; did not appreciate any M/R/G Lungs: bilateral crackles notes as well as faint expiratory wheezes b/l;  Abdomen: + BS; soft, ND, NT Extremities: 1+ edema in the b/l LE's; no muscle wasting   Labs on Admission: I have personally reviewed following labs and imaging studies  CBC: Recent Labs  Lab 08/28/18 1824  WBC 5.7  NEUTROABS 5.4  HGB 9.2*  HCT 34.2*  MCV 88.1  PLT 66*   Basic Metabolic Panel: Recent Labs  Lab 08/28/18 1824  NA 139  K 4.5  CL 89*  CO2 39*  GLUCOSE 153*  BUN 47*  CREATININE 1.63*  CALCIUM 8.5*   GFR: Estimated Creatinine Clearance: 44.8 mL/min (A) (by C-G formula based on SCr of 1.63 mg/dL (H)). Liver Function Tests: Recent Labs  Lab 08/28/18 1824  AST 16  ALT 10  ALKPHOS 62  BILITOT 1.0  PROT 7.0  ALBUMIN 4.0   No results for input(s): LIPASE, AMYLASE in the last 168 hours. No results for input(s): AMMONIA in the last 168 hours. Coagulation Profile: No results for input(s): INR, PROTIME in the last 168 hours. Cardiac  Enzymes: Recent Labs  Lab 08/28/18 1824  TROPONINI 0.03*   BNP (last 3 results) No results for input(s): PROBNP in the last 8760 hours. HbA1C: No results for input(s): HGBA1C in the last 72 hours. CBG: No results for input(s): GLUCAP in the last 168 hours. Lipid Profile: No results for input(s): CHOL, HDL, LDLCALC, TRIG, CHOLHDL, LDLDIRECT in the last 72 hours. Thyroid Function Tests: No results for input(s): TSH, T4TOTAL, FREET4, T3FREE, THYROIDAB in the last 72 hours. Anemia Panel: No results for input(s): VITAMINB12, FOLATE, FERRITIN, TIBC, IRON, RETICCTPCT in the last 72 hours. Urine analysis:    Component Value Date/Time   COLORURINE YELLOW 04/07/2018 1820   APPEARANCEUR HAZY (A) 04/07/2018 1820   LABSPEC 1.015 04/07/2018 1820   PHURINE 6.0 04/07/2018 1820   GLUCOSEU NEGATIVE 04/07/2018 1820   GLUCOSEU NEGATIVE 08/17/2016 1340   HGBUR SMALL (A) 04/07/2018 1820   BILIRUBINUR NEGATIVE 04/07/2018 1820   BILIRUBINUR neg 01/15/2015 [place] 04/07/2018 1820   PROTEINUR 30 (A) 04/07/2018 1820   UROBILINOGEN 1.0 08/17/2016 1340   NITRITE NEGATIVE 04/07/2018 1820   LEUKOCYTESUR NEGATIVE 04/07/2018 1820    Radiological Exams on Admission: Dg Chest Portable 1 View  Result Date: 08/28/2018 CLINICAL DATA:  64 year old female with a history of shortness of breath EXAM: PORTABLE CHEST 1 VIEW COMPARISON:  07/14/2018, 06/08/2018 FINDINGS: Cardiomediastinal silhouette unchanged in size and contour with cardiomegaly. Mixed interstitial and airspace opacities of the bilateral lungs, worse than the comparison. Retrocardiac opacity with obscuration of the left hemidiaphragm and blunting of left costophrenic  angle. Partial obscuration of the right hemidiaphragm with blunting of the costophrenic angle. IMPRESSION: Mixed interstitial and airspace opacities of the bilateral lungs, worse than the comparison, with the differential including multifocal infection and/or edema. Likely  bibasilar pleural effusions and associated atelectasis/consolidation. Cardiomegaly Electronically Signed   By: Corrie Mckusick D.O.   On: 08/28/2018 18:38     Assessment/Plan   Renee Noble is a 63 y.o. female with medical history significant for chronic hypoxic respiratory failure on 3 L continuous nasal cannula, severe COPD, chronic diastolic heart failure, type 2 diabetes mellitus, morbid obesity, chronic thrombocytopenia, who is admitted to [facility] on 08/28/2018 with acute on chronic hypoxic respiratory failure in the setting of acute on chronic diastolic heart failure and acute COPD exacerbation after presenting from home to the [facility] emergency department complaining of shortness of breath.   Principal Problem:   Acute on chronic respiratory failure with hypoxia (HCC) Active Problems:   Type II diabetes mellitus with renal manifestations (HCC)   Chronic pain syndrome   Acute on chronic diastolic CHF (congestive heart failure) (HCC)   Hypothyroidism   Acute exacerbation of chronic obstructive pulmonary disease (COPD) (HCC)   Atrial flutter with rapid ventricular response ([place])   AKI (acute kidney injury) ([place])   #) Acute on chronic diastolic heart failure: Diagnosis on the basis of 3 to 4 days of progressive shortness of breath associated with orthopnea, weight gain, increased work of breathing, increase in supplemental oxygen demands relative to baseline, as further described low, elevated BNP, and presenting chest x-ray demonstrating evidence of interstitial/airspace opacities concerning for edema.  This is in the setting of a known history of chronic diastolic heart failure, with most recent echocardiogram occurring in November 2019, with results of such as outlined above.  Home diuretic regimen consists of torsemide 20 mg p.o. daily, which would be equivalent to Lasix 40 mg IV daily.  At presentation, patient is also noted to be in atrial flutter/atrial fibrillation with  RVR, which I suspect is a sequela of volume overload in the setting of acute on chronic diastolic heart failure as well as secondary to physiologic stress stemming from hypoxia associated with this process as well as concomitant acute COPD exacerbation.  Plan: Lasix 60 mg IV twice daily, first dose now.  Monitor strict I's and O's as well as daily weights.  Add on serum magnesium level to labs are to collected in the ED.  We will also recheck serum magnesium level in the morning.  Recheck BMP in the morning.  Monitor on telemetry.     #) Acute COPD exacerbation: Suspect a degree of exacerbation of patient's underlying gold stage III COPD as a consequence of precipitating acute on chronic diastolic heart failure, as above.  Consider the possibility of initiation of a azithromycin or Levaquin for the benefit of reduction and duration of hospitalization in the setting of acute COPD exacerbation.  However, will refrain from starting either these antibiotics finding of QTC prolongation on presenting EKG.  Doxycycline could serve as an alternative to these 2 options, but would not both the same QTc prolongation concerns.  Patient confirms a long smoking history, but also confirms that she completely quit smoking approximately 2 years ago, without any subsequent resumption thereof.  In the setting of concomitant presenting atrial flutter/atrial fibrillation with RVR, will use Xopenex as preferred beta-2 agonist for now.  Plan: Xopenex-ipratropium nebulizers every 6 hours while awake.  PRN Xopenex nebs.  Solu-Medrol 40 mg IV  every 6 hours.  Check ABG now.  Consideration of initiation of doxycycline, as above.  Continuous pulse oximetry.     #) Acute on chronic hypoxic hypercapnic respiratory failure: In the setting of underlying COPD, chronic diastolic heart failure, and obesity hypoventilation syndrome with baseline supplemental oxygen requirement of 3 L continuous via [place], patient was noted by EMS to  demonstrate initial oxygen saturations in the high 70s on her baseline 3 L nasal cannula.  She is now maintaining oxygen saturations in the low to mid 90s on 5 to 6 L nasal cannula.  This appears to be multifactorial consequence of suspected acute on chronic diastolic heart failure as well as acute COPD exacerbation, with secondary implications from new diagnosis of atrial flutter/atrial fibrillation with RVR.  No evidence of underlying infectious process.  Low index of suspicion for COVID-19, particular in the absence of any known or suspected COVID-19 exposures.   Plan: Work-up and management of acute on chronic diastolic failure as well as acute COPD exacerbation, as further described above, including IV diuresis as well as nebulizer intervention, as above.  Check ABG now.  Continuous pulse oximetry.  We will also check a serum phosphorus level, as hypophosphatemia can be associated with diaphragmatic paralysis.     #) Atrial flutter/atrial fibrillation with RVR: Per chart review, it appears that the patient has a history of SVT, but no clear documentation of A. fib/a flutter.  Outpatient AV nodal blocking regimen consists of diltiazem 180 mg p.o. daily.  I suspect that presenting A. fib/a flutter with RVR is a consequence of acutely decompensated heart failure as well as acute COPD exacerbation, as opposed to representing the primary pathological driving force.  As result, will initially refrain from aggressive rate control interventions so as to prevent decompensation that could occur by limiting compensatory tachycardia in the setting of volume overload.  Rather, will initiate IV diuresis efforts immediately, and trend heart rate response to this measure. Also, on the basis of CHA2DS2-VASc score of 4 (1 for htn, 1 for age, 1 for DM, and 1 for gender), patient meets meets criteria for chronic anticoagulation for thromboembolic prophylaxis. She is not currently on any anticoagulation measures, would  benefit from further discussions regarding risk versus benefits of initiation of such.   Plan: Work-up and management of presenting acute on chronic diastolic heart failure, with IV diuresis efforts as treatment of suspected driving factor behind compensatory tachycardic response.  Will consider initiation of additional AV nodal blocking medications, such as diltiazem drip.  However, as the patient is currently tolerating her heart rates from both blood pressure and symptomatic standpoint, I prefer initially to try to treat the underlying source of her A fib/flutter, with ensuing consideration for additional AV nodal blocking intervention should this approach be unsuccessful in improving rate control.  Consider additional discussions regarding risk versus benefits for initiation of chronic anticoagulation, as above.  Will add on serum magnesium level to labs are to collected in the ED.  Monitor on telemetry.  Repeat BMP in the morning.  We will also check reflex TSH, particularly given patient's history of acquired hypothyroidism.     #) Acute kidney injury: Presenting labs reflect creatinine 1.63, which is relative to baseline values of 1.1-1.2 as well as most recent prior creatinine value of 1.17 on 07/31/2018.  Suspect prerenal etiology stemming from hypoperfusion secondary to acute on chronic hypoxic respiratory failure, as above, as well as diminished renal perfusion gradient stemming from acute on chronic diastolic heart failure.  Plan: Work-up and management acutely decompensated heart failure, including IV diuresis interventions as above, as well as acute on chronic hypoxic respiratory failure, as above.  Check urinalysis, with attention for the presence of urinary cast.  We will also check random urine sodium as well as random urine creatinine.  Monitor strict I's and O's.  Attempt to avoid nephrotoxic agents.  Repeat BMP in the morning.     #) Chronic pain syndrome: On scheduled MS Contin as  an outpatient.  Patient reports adequate pain control at the present time.  Plan: We will attempt to refrain from resumption of her MS Contin this evening in order to prevent any contribution of diminished respiratory drive from this medication.     #) Type 2 diabetes mellitus: Outpatient insulin regimen consists of Lantus 31 units subcu every morning as well as sliding scale Humalog 3 times daily with meals.  Presenting blood sugar per presenting CMP noted to be 153.  Would anticipate subsequent relative hyperglycemia given plan for initiation of systemic corticosteroids for acute COPD exacerbation, as above.  Plan: In terms of basal insulin, will start conservatively with Lantus 20 units subcu every morning, with anticipation that this dose will likely need to be increased.  Have also ordered Accu-Cheks q. before meals and at bedtime with associated sliding scale insulin     #) Acquired hypothyroidism: On Synthroid as an outpatient.  Plan: We will check reflex TSH, particular in the setting of presenting new diagnosis of atrial flutter/atrial fibrillation.  For now we will continue with home dose of Synthroid.    DVT prophylaxis: SCD's Code Status: full (confirmed per my discussions with patient this evening).  Family Communication: (none) Disposition Plan:  Per Rounding Team Consults called: (none)  Admission status: inpatient; stepdown unit.     PLEASE NOTE THAT DRAGON DICTATION SOFTWARE WAS USED IN THE CONSTRUCTION OF THIS NOTE.   [place] [facility] Pager [phone] From 3PM- 11PM.   Otherwise, please contact night-coverage  [email or web address] Password [facility]  08/28/2018, 8:28 PM

## 2018-08-28 NOTE — ED Notes (Signed)
ED TO INPATIENT HANDOFF REPORT  ED Nurse Name and Phone #: ;Jermario Kalmar [phone]  S Name/Age/Gender Julien Nordmann 63 y.o. female Room/Bed: APA07/APA07  Code Status   Code Status: Full Code  Home/SNF/Other Home Patient oriented to: self Is this baseline? Yes   Triage Complete: Triage complete  Chief Complaint SOB   Triage Note Arrival via EMS with complaint of shortness of breath since x 4 days, worsening this am. Home health rn in and called EMS. PO2 on ems arrival was in 70's . Pt given duoneb, albuterol tx x 2, solumedrol and 2 gm f Mag in route.    Allergies Allergies  Allergen Reactions  . Gabapentin Anaphylaxis  . Lyrica [Pregabalin] Shortness Of Breath    Trouble breathing  . Ketorolac Tromethamine Hives  . Lisinopril Cough    Level of Care/Admitting Diagnosis ED Disposition    ED Disposition Condition [facility] Area: [facility] [[id number]]  Level of Care: Stepdown [14]  Diagnosis: Acute on chronic respiratory failure with hypoxia [facility]) [[id number]]  Admitting Physician: Rhetta Mura [[id number]]  Attending Physician: Rhetta Mura [[id number]]  Estimated length of stay: past midnight tomorrow  Certification:: I certify this patient will need inpatient services for at least 2 midnights  PT Class (Do Not Modify): Inpatient [101]  PT Acc Code (Do Not Modify): Private [1]       B Medical/Surgery History Past Medical History:  Diagnosis Date  . Abdominal mass of other site   . Cervical compression fracture ([place])   . CHF (congestive heart failure) ([place])   . Chronic kidney disease, stage 3 (HCC)    Borderline Stage 2-3  . Chronic lower limb pain   . COPD (chronic obstructive pulmonary disease) ([place])   . CTS (carpal tunnel syndrome)   . Depression   . Diabetes ([place])    Type II  . Fibromyalgia   . Hepatitis C    cured last year (2018)  . Hypercholesteremia   . Hypertension   . Hypothyroidism   . Morbid obesity ([place])   .  Neuropathy    Diabetes  . OSA (obstructive sleep apnea)   . Osteoarthritis   . Renal cancer ([place])    Left Kidney Removed  . RLS (restless legs syndrome)   . Syncope   . Venous stasis    Past Surgical History:  Procedure Laterality Date  . ABDOMINAL HERNIA REPAIR     x2  . ABDOMINAL HYSTERECTOMY    . BLADDER SUSPENSION    . CHOLECYSTECTOMY    . CYST EXCISION     Head  . INCISIONAL HERNIA REPAIR    . KNEE ARTHROSCOPY     Bilateral  . NEPHRECTOMY     Left  . TONSILLECTOMY    . TOOTH EXTRACTION    . UMBILICAL HERNIA REPAIR    . VAGINA SURGERY    . WISDOM TOOTH EXTRACTION       A IV Location/Drains/Wounds Patient Lines/Drains/Airways Status   Active Line/Drains/Airways    Name:   Placement date:   Placement time:   Site:   Days:   Peripheral IV 08/28/18 Left Antecubital   08/28/18    2112    Antecubital   less than 1   Pressure Injury 07/15/18 Stage II -  Partial thickness loss of dermis presenting as a shallow open ulcer with a red, pink wound bed without slough.   07/15/18    0125     44   Wound /  Incision (Open or Dehisced) 07/15/18 Venous stasis ulcer Pretibial Right   07/15/18    0130    Pretibial   44          Intake/Output Last 24 hours No intake or output data in the 24 hours ending 08/28/18 2124  Labs/Imaging Results for orders placed or performed during the hospital encounter of 08/28/18 (from the past 48 hour(s))  Comprehensive metabolic panel     Status: Abnormal   Collection Time: 08/28/18  6:24 PM  Result Value Ref Range   Sodium 139 135 - 145 mmol/L   Potassium 4.5 3.5 - 5.1 mmol/L   Chloride 89 (L) 98 - 111 mmol/L   CO2 39 (H) 22 - 32 mmol/L   Glucose, Bld 153 (H) 70 - 99 mg/dL   BUN 47 (H) 8 - 23 mg/dL   Creatinine, Ser 1.63 (H) 0.44 - 1.00 mg/dL   Calcium 8.5 (L) 8.9 - 10.3 mg/dL   Total Protein 7.0 6.5 - 8.1 g/dL   Albumin 4.0 3.5 - 5.0 g/dL   AST 16 15 - 41 U/L   ALT 10 0 - 44 U/L   Alkaline Phosphatase 62 38 - 126 U/L   Total Bilirubin  1.0 0.3 - 1.2 mg/dL   GFR calc non Af Amer 33 (L) >60 mL/min   GFR calc Af Amer 38 (L) >60 mL/min   Anion gap 11 5 - 15    Comment: Performed at [facility], [address], [place], [place] [address]  CBC with Differential     Status: Abnormal   Collection Time: 08/28/18  6:24 PM  Result Value Ref Range   WBC 5.7 4.0 - 10.5 K/uL    Comment: WHITE COUNT CONFIRMED ON SMEAR   RBC 3.88 3.87 - 5.11 MIL/uL   Hemoglobin 9.2 (L) 12.0 - 15.0 g/dL   HCT 34.2 (L) 36.0 - 46.0 %   MCV 88.1 80.0 - 100.0 fL   MCH 23.7 (L) 26.0 - 34.0 pg   MCHC 26.9 (L) 30.0 - 36.0 g/dL   RDW 16.9 (H) 11.5 - 15.5 %   Platelets 66 (L) 150 - 400 K/uL    Comment: PLATELET COUNT CONFIRMED BY SMEAR SPECIMEN CHECKED FOR CLOTS Immature Platelet Fraction may be clinically indicated, consider ordering this additional test [id number]    nRBC 0.0 0.0 - 0.2 %   Neutrophils Relative % 94 %   Neutro Abs 5.4 1.7 - 7.7 K/uL   Lymphocytes Relative 3 %   Lymphs Abs 0.2 (L) 0.7 - 4.0 K/uL   Monocytes Relative 2 %   Monocytes Absolute 0.1 0.1 - 1.0 K/uL   Eosinophils Relative 0 %   Eosinophils Absolute 0.0 0.0 - 0.5 K/uL   Basophils Relative 0 %   Basophils Absolute 0.0 0.0 - 0.1 K/uL   Immature Granulocytes 1 %   Abs Immature Granulocytes 0.03 0.00 - 0.07 K/uL    Comment: Performed at [facility], [address], [place], [place] [address]  Troponin I - Once     Status: Abnormal   Collection Time: 08/28/18  6:24 PM  Result Value Ref Range   Troponin I 0.03 (HH) <0.03 ng/mL    Comment: CRITICAL RESULT CALLED TO, READ BACK BY AND VERIFIED WITH: Sascha Baugher,L ON 08/28/18 AT 1930 BY LOY,C Performed at [facility], [address], [place], [place] [address]   Brain natriuretic peptide     Status: Abnormal   Collection Time: 08/28/18  6:24 PM  Result Value Ref Range  B Natriuretic Peptide 858.0 (H) 0.0 - 100.0 pg/mL    Comment: Performed at [facility], [address] [place], [place] [address]   Dg Chest Portable 1  View  Result Date: 08/28/2018 CLINICAL DATA:  63 year old female with a history of shortness of breath EXAM: PORTABLE CHEST 1 VIEW COMPARISON:  07/14/2018, 06/08/2018 FINDINGS: Cardiomediastinal silhouette unchanged in size and contour with cardiomegaly. Mixed interstitial and airspace opacities of the bilateral lungs, worse than the comparison. Retrocardiac opacity with obscuration of the left hemidiaphragm and blunting of left costophrenic angle. Partial obscuration of the right hemidiaphragm with blunting of the costophrenic angle. IMPRESSION: Mixed interstitial and airspace opacities of the bilateral lungs, worse than the comparison, with the differential including multifocal infection and/or edema. Likely bibasilar pleural effusions and associated atelectasis/consolidation. Cardiomegaly Electronically Signed   By: Corrie Mckusick D.O.   On: 08/28/2018 18:38    Pending Labs Unresulted Labs (From admission, onward)    Start     Ordered   08/29/18 0500  Magnesium  Tomorrow morning,   R     08/28/18 2102   08/29/18 0500  Phosphorus  Tomorrow morning,   R     08/28/18 2102   08/29/18 0500  CBC WITH DIFFERENTIAL  Tomorrow morning,   R     08/28/18 2102   08/29/18 0500  Comprehensive metabolic panel  Tomorrow morning,   R     08/28/18 2102   08/29/18 0500  TSH  Tomorrow morning,   R     08/28/18 2115   08/28/18 2114  Creatinine, urine, random  Once,   R     08/28/18 2113   08/28/18 2113  Urinalysis, Routine w reflex microscopic  Once,   R     08/28/18 2113   08/28/18 2113  Sodium, urine, random  Once,   R     08/28/18 2113   08/28/18 2103  Magnesium  Add-on,   R     08/28/18 2102          Vitals/Pain Today's Vitals   08/28/18 2030 08/28/18 2040 08/28/18 2042 08/28/18 2045  BP: (!) 118/97     Pulse: (!) 142 (!) 144 (!) 144 (!) 144  Resp: 13 15 (!) 23 16  Temp:      TempSrc:      SpO2: (!) 87% 92% 92% 93%  Weight:      Height:      PainSc:        Isolation Precautions No  active isolations  Medications Medications  heparin injection 5,000 Units (has no administration in time range)  acetaminophen (TYLENOL) tablet 650 mg (has no administration in time range)    Or  acetaminophen (TYLENOL) suppository 650 mg (has no administration in time range)  methylPREDNISolone sodium succinate (SOLU-MEDROL) 40 mg/mL injection 40 mg (has no administration in time range)  levalbuterol (XOPENEX) nebulizer solution 0.63 mg (has no administration in time range)  levalbuterol (XOPENEX) nebulizer solution 0.63 mg (has no administration in time range)  ipratropium (ATROVENT) nebulizer solution 0.5 mg (has no administration in time range)  furosemide (LASIX) injection 60 mg (has no administration in time range)  insulin aspart (novoLOG) injection 0-15 Units (has no administration in time range)    Mobility walks with device Low fall risk      R Recommendations: See Admitting Provider Note  Report given to:   Additional Notes:

## 2018-08-28 NOTE — ED Triage Notes (Signed)
Arrival via EMS with complaint of shortness of breath since x 4 days, worsening this am. Home health rn in and called EMS. PO2 on ems arrival was in 70's . Pt given duoneb, albuterol tx x 2, solumedrol and 2 gm f Mag in route.

## 2018-08-28 NOTE — Telephone Encounter (Signed)
Received call from [place] with patient's home health. She had gone out to check on patient today and found patinet hypoxic despite use of her oxygen with tachycardia nad URI symptoms. Unsure of fever. Notes resting HR at 150. Patient's cousin's husband (whom she lives with) is in ICU with COVID 19.   Shakira and family were previously  instructed on possible symptoms and to seek immediate care if these symptoms arose giving respiratory history.  Colletta [place] notes family present states symptoms been worsening for a few days but she refused to go to ER.   Stephanie instructed to call 911 immediately. She agrees to do so. Will monitor patient's chart.

## 2018-08-29 ENCOUNTER — Other Ambulatory Visit: Payer: Self-pay

## 2018-08-29 LAB — URINALYSIS, ROUTINE W REFLEX MICROSCOPIC
Bacteria, UA: NONE SEEN
Bilirubin Urine: NEGATIVE
Glucose, UA: NEGATIVE mg/dL
Ketones, ur: NEGATIVE mg/dL
Leukocytes,Ua: NEGATIVE
Nitrite: NEGATIVE
Protein, ur: 30 mg/dL — AB
Specific Gravity, Urine: 1.014 (ref 1.005–1.030)
pH: 5 (ref 5.0–8.0)

## 2018-08-29 LAB — COMPREHENSIVE METABOLIC PANEL
ALT: 10 U/L (ref 0–44)
AST: 16 U/L (ref 15–41)
Albumin: 3.8 g/dL (ref 3.5–5.0)
Alkaline Phosphatase: 61 U/L (ref 38–126)
Anion gap: 13 (ref 5–15)
BUN: 52 mg/dL — ABNORMAL HIGH (ref 8–23)
CO2: 38 mmol/L — ABNORMAL HIGH (ref 22–32)
Calcium: 8.4 mg/dL — ABNORMAL LOW (ref 8.9–10.3)
Chloride: 88 mmol/L — ABNORMAL LOW (ref 98–111)
Creatinine, Ser: 1.77 mg/dL — ABNORMAL HIGH (ref 0.44–1.00)
GFR calc Af Amer: 34 mL/min — ABNORMAL LOW (ref >60)
GFR calc non Af Amer: 30 mL/min — ABNORMAL LOW (ref >60)
Glucose, Bld: 171 mg/dL — ABNORMAL HIGH (ref 70–99)
Potassium: 4.7 mmol/L (ref 3.5–5.1)
Sodium: 139 mmol/L (ref 135–145)
Total Bilirubin: 1 mg/dL (ref 0.3–1.2)
Total Protein: 6.9 g/dL (ref 6.5–8.1)

## 2018-08-29 LAB — CBC WITH DIFFERENTIAL/PLATELET
Abs Immature Granulocytes: 0.03 10*3/uL (ref 0.00–0.07)
Basophils Absolute: 0 10*3/uL (ref 0.0–0.1)
Basophils Relative: 0 %
Eosinophils Absolute: 0 10*3/uL (ref 0.0–0.5)
Eosinophils Relative: 0 %
HCT: 35.3 % — ABNORMAL LOW (ref 36.0–46.0)
Hemoglobin: 9.6 g/dL — ABNORMAL LOW (ref 12.0–15.0)
Immature Granulocytes: 1 %
Lymphocytes Relative: 5 %
Lymphs Abs: 0.2 10*3/uL — ABNORMAL LOW (ref 0.7–4.0)
MCH: 24.2 pg — ABNORMAL LOW (ref 26.0–34.0)
MCHC: 27.2 g/dL — ABNORMAL LOW (ref 30.0–36.0)
MCV: 88.9 fL (ref 80.0–100.0)
Monocytes Absolute: 0.1 10*3/uL (ref 0.1–1.0)
Monocytes Relative: 2 %
Neutro Abs: 3.1 10*3/uL (ref 1.7–7.7)
Neutrophils Relative %: 92 %
Platelets: 66 10*3/uL — ABNORMAL LOW (ref 150–400)
RBC: 3.97 MIL/uL (ref 3.87–5.11)
RDW: 16.8 % — ABNORMAL HIGH (ref 11.5–15.5)
WBC: 3.4 10*3/uL — ABNORMAL LOW (ref 4.0–10.5)
nRBC: 0 % (ref 0.0–0.2)

## 2018-08-29 LAB — BLOOD GAS, ARTERIAL
Acid-Base Excess: 13 mmol/L — ABNORMAL HIGH (ref 0.0–2.0)
Bicarbonate: 34.9 mmol/L — ABNORMAL HIGH (ref 20.0–28.0)
FIO2: 40
O2 Saturation: 88.9 %
Patient temperature: 37
pCO2 arterial: 100 mmHg (ref 32.0–48.0)
pH, Arterial: 7.233 — ABNORMAL LOW (ref 7.350–7.450)
pO2, Arterial: 66.7 mmHg — ABNORMAL LOW (ref 83.0–108.0)

## 2018-08-29 LAB — C-REACTIVE PROTEIN: CRP: <0.8 mg/dL (ref <1.0)

## 2018-08-29 LAB — RESPIRATORY PANEL BY PCR

## 2018-08-29 LAB — STREP PNEUMONIAE URINARY ANTIGEN: Strep Pneumo Urinary Antigen: NEGATIVE

## 2018-08-29 LAB — GLUCOSE, CAPILLARY
Glucose-Capillary: 155 mg/dL — ABNORMAL HIGH (ref 70–99)
Glucose-Capillary: 206 mg/dL — ABNORMAL HIGH (ref 70–99)
Glucose-Capillary: 176 mg/dL — ABNORMAL HIGH (ref 70–99)
Glucose-Capillary: 208 mg/dL — ABNORMAL HIGH (ref 70–99)

## 2018-08-29 LAB — SARS CORONAVIRUS 2 BY RT PCR (HOSPITAL ORDER, PERFORMED IN ~~LOC~~ HOSPITAL LAB): SARS Coronavirus 2: NEGATIVE

## 2018-08-29 LAB — D-DIMER, QUANTITATIVE: D-Dimer, Quant: 2.13 ug/mL-FEU — ABNORMAL HIGH (ref 0.00–0.50)

## 2018-08-29 LAB — MRSA PCR SCREENING: MRSA by PCR: NEGATIVE

## 2018-08-29 LAB — TSH: TSH: 1.372 u[IU]/mL (ref 0.350–4.500)

## 2018-08-29 LAB — LACTATE DEHYDROGENASE: LDH: 107 U/L (ref 98–192)

## 2018-08-29 LAB — PHOSPHORUS: Phosphorus: 7 mg/dL — ABNORMAL HIGH (ref 2.5–4.6)

## 2018-08-29 LAB — FERRITIN: Ferritin: 36 ng/mL (ref 11–307)

## 2018-08-29 LAB — MAGNESIUM: Magnesium: 2.3 mg/dL (ref 1.7–2.4)

## 2018-08-29 LAB — PROCALCITONIN: Procalcitonin: <0.1 ng/mL

## 2018-08-29 LAB — SODIUM, URINE, RANDOM: Sodium, Ur: <10 mmol/L

## 2018-08-29 LAB — CREATININE, URINE, RANDOM: Creatinine, Urine: 144.46 mg/dL

## 2018-08-29 LAB — FIBRINOGEN: Fibrinogen: 330 mg/dL (ref 210–475)

## 2018-08-29 MED ORDER — LEVALBUTEROL TARTRATE 45 MCG/ACT IN AERO
2.0000 | INHALATION_SPRAY | Freq: Four times a day (QID) | RESPIRATORY_TRACT | Status: DC | PRN
  Filled 2018-08-29: qty 15

## 2018-08-29 MED ORDER — ORAL CARE MOUTH RINSE
15.0000 mL | Freq: Two times a day (BID) | OROMUCOSAL | Status: DC
  Administered 2018-08-29 – 2018-08-30 (×2): 15 mL via OROMUCOSAL

## 2018-08-29 MED ORDER — SIMETHICONE 80 MG PO CHEW
160.0000 mg | CHEWABLE_TABLET | Freq: Once | ORAL | Status: AC
  Administered 2018-08-29: 21:00:00 160 mg via ORAL
  Filled 2018-08-29: qty 2

## 2018-08-29 MED ORDER — APIXABAN 5 MG PO TABS
5.0000 mg | ORAL_TABLET | Freq: Two times a day (BID) | ORAL | Status: DC
  Administered 2018-08-29 – 2018-08-30 (×3): 5 mg via ORAL
  Filled 2018-08-29 (×3): qty 1

## 2018-08-29 MED ORDER — DILTIAZEM HCL ER COATED BEADS 180 MG PO CP24: 180.0000 mg | ORAL_CAPSULE | Freq: Every day | ORAL | Status: DC

## 2018-08-29 MED ORDER — DILTIAZEM HCL 100 MG IV SOLR
5.0000 mg/h | INTRAVENOUS | Status: DC
  Administered 2018-08-29: 09:00:00 5 mg/h via INTRAVENOUS
  Administered 2018-08-29: 21:00:00 10 mg/h via INTRAVENOUS
  Administered 2018-08-30: 08:00:00 7.5 mg/h via INTRAVENOUS
  Filled 2018-08-29 (×3): qty 100

## 2018-08-29 MED ORDER — NYSTATIN 100000 UNIT/GM EX POWD
Freq: Three times a day (TID) | CUTANEOUS | Status: DC
  Administered 2018-08-29 – 2018-09-16 (×32): via TOPICAL
  Filled 2018-08-29 (×3): qty 15

## 2018-08-29 NOTE — Progress Notes (Signed)
**Note De-identified Renee Noble Obfuscation** Patient removed from BIPAP and placed on 6 L [place]; tolerating well at this time.  RRT to continue to monitor. 

## 2018-08-29 NOTE — Progress Notes (Signed)
ANTICOAGULATION CONSULT NOTE - Initial Consult  Pharmacy Consult for apixaban Indication: atrial fibrillation  Allergies  Allergen Reactions  . Gabapentin Anaphylaxis  . Lyrica [Pregabalin] Shortness Of Breath    Trouble breathing  . Ketorolac Tromethamine Hives  . Lisinopril Cough    Patient Measurements: Height: 5\' 4"  (162.6 cm) Weight: 288 lb 5.8 oz (130.8 kg) IBW/kg (Calculated) : 54.7   Vital Signs: Temp: 97.4 F (36.3 C) (04/14 0400) Temp Source: Axillary (04/14 0400) BP: 161/47 (04/14 0700) Pulse Rate: 108 (04/14 0700)  Labs: Recent Labs    08/28/18 1824 08/29/18 0412  HGB 9.2* 9.6*  HCT 34.2* 35.3*  PLT 66* 66*  CREATININE 1.63* 1.77*  TROPONINI 0.03*  --     Estimated Creatinine Clearance: 42.6 mL/min (A) (by C-G formula based on SCr of 1.77 mg/dL (H)).   Medical History: Past Medical History:  Diagnosis Date  . Abdominal mass of other site   . Cervical compression fracture ([place])   . CHF (congestive heart failure) ([place])   . Chronic kidney disease, stage 3 (HCC)    Borderline Stage 2-3  . Chronic lower limb pain   . COPD (chronic obstructive pulmonary disease) ([place])   . CTS (carpal tunnel syndrome)   . Depression   . Diabetes ([place])    Type II  . Fibromyalgia   . Hepatitis C    cured last year (2018)  . Hypercholesteremia   . Hypertension   . Hypothyroidism   . Morbid obesity ([place])   . Neuropathy    Diabetes  . OSA (obstructive sleep apnea)   . Osteoarthritis   . Renal cancer ([place])    Left Kidney Removed  . RLS (restless legs syndrome)   . Syncope   . Venous stasis     Medications:  Medications Prior to Admission  Medication Sig Dispense Refill Last Dose  . albuterol (PROAIR HFA) 108 (90 Base) MCG/ACT inhaler Inhale 1-2 puffs into the lungs every 6 (six) hours as needed for wheezing or shortness of breath. 1 Inhaler 5 08/27/2018 at Unknown time  . budesonide-formoterol (SYMBICORT) 160-4.5 MCG/ACT inhaler Inhale 2 puffs into the lungs  2 (two) times daily. 1 Inhaler 11 08/28/2018 at Unknown time  . clotrimazole-betamethasone (LOTRISONE) cream Apply 1 application topically 2 (two) times daily as needed. (Patient taking differently: Apply 1 application topically 2 (two) times daily as needed (yeast). ) 45 g 0 Past Week at Unknown time  . dextromethorphan-guaiFENesin (MUCINEX DM) 30-600 MG 12hr tablet Take 1 tablet by mouth 2 (two) times daily as needed for cough. (Patient taking differently: Take 1 tablet by mouth 2 (two) times daily. ) 20 tablet 0 08/28/2018 at Unknown time  . diltiazem (CARDIZEM CD) 180 MG 24 hr capsule Take 1 capsule (180 mg total) by mouth daily. 90 capsule 1 08/28/2018 at Unknown time  . glucose blood (ACCU-CHEK AVIVA) test strip Check blood sugars 4 times per day for diabetes. Dx:E11.29 100 each 12 Past Month at Unknown time  . insulin glargine (LANTUS) 100 UNIT/ML injection Inject 0.15 mLs (15 Units total) into the skin daily. (Patient taking differently: Inject 31 Units into the skin every morning. ) 10 mL 11 08/28/2018 at Unknown time  . insulin lispro (HUMALOG) 100 UNIT/ML injection Inject 0.15 mLs (15 Units total) into the skin 3 (three) times daily as needed for high blood sugar. Uses sliding scale. Sugar over 200 will give insulin. 10 mL 1 08/26/2018 at Unknown time  . ipratropium-albuterol (DUONEB) 0.5-2.5 (3) MG/3ML SOLN Take  3 mLs by nebulization 4 (four) times daily. Dx: J44.9 (Patient taking differently: Take 3 mLs by nebulization every 6 (six) hours as needed (shortness of breath). ) 360 mL 5 08/27/2018 at Unknown time  . levothyroxine (SYNTHROID, LEVOTHROID) 88 MCG tablet TAKE ONE TABLET BY MOUTH DAILY (Patient taking differently: Take 88 mcg by mouth every evening. ) 90 tablet 1 08/27/2018 at Unknown time  . morphine (MS CONTIN) 30 MG 12 hr tablet Take 30 mg by mouth every 12 (twelve) hours.  0 08/25/2018 at Unknown time  . Multiple Vitamins-Minerals (CENTRUM SILVER PO) Take by mouth.   08/28/2018 at Unknown  time  . nortriptyline (PAMELOR) 25 MG capsule Take 1 capsule (25 mg total) by mouth at bedtime. 90 capsule 1 08/27/2018 at Unknown time  . nystatin (MYCOSTATIN/NYSTOP) powder APPLY TOPICALLY TWICE DAILY AS NEEDED FOR YEAST INFECTION (Patient taking differently: Apply 1 g topically 2 (two) times daily as needed (yeast). ) 15 g 2 08/28/2018 at Unknown time  . OXYGEN Inhale 3.5 L into the lungs continuous. O2 3.5L at home.   08/28/2018 at Unknown time  . pravastatin (PRAVACHOL) 20 MG tablet TAKE ONE TABLET BY MOUTH EVERY DAY (Patient taking differently: Take 20 mg by mouth every evening. ) 90 tablet 0 08/27/2018 at Unknown time  . Probiotic Product (PROBIOTIC PO) Take by mouth.   08/27/2018 at Unknown time  . torsemide (DEMADEX) 20 MG tablet Take 1 tablet (20 mg total) by mouth daily for 30 days. 30 tablet 3 08/28/2018 at Unknown time  . potassium chloride (K-DUR) 10 MEQ tablet Take 1 tablet (10 mEq total) by mouth daily for 30 days. (Patient not taking: Reported on 08/28/2018) 30 tablet 3 Not Taking at Unknown time    Assessment: Pharmacy consulted to dose apixaban in patient with atrial fibrillation  Goal of Therapy:   Monitor platelets by anticoagulation protocol: Yes   Plan:  Apixaban 5 mg twice daily. Monitor labs and s/s of bleeding.  Ramond Craver 08/29/2018,9:01 AM

## 2018-08-29 NOTE — Progress Notes (Signed)
Pt has not urinated since being up to floor. Bladder scan volume of 365mL. Dr. Myna Hidalgo paged and made aware. Will continue to monitor pt. Waiting for call back/orders

## 2018-08-29 NOTE — Care Management (Signed)
Patient is active with [facility] for nursing services.

## 2018-08-29 NOTE — Progress Notes (Signed)
Report given to RN at [facility]. Pt with [facility] transport.

## 2018-08-29 NOTE — Telephone Encounter (Signed)
Picked up forms and faxed to [facility]

## 2018-08-29 NOTE — Progress Notes (Signed)
Per Dr Marlan Palau, bladder scanned pt revealing only 131ml urine in bladder. Pt states she feels the urge to void but cannot. Purewick in place, will continue to monitor.

## 2018-08-29 NOTE — Progress Notes (Addendum)
PROGRESS NOTE    Renee Noble  OAC:[id number] DOB: Jul 25, 1955 DOA: 08/28/2018 PCP: [name], PA-C   Brief Narrative:  Per HPI: Renee Noble is a 63 y.o. female with medical history significant for chronic hypoxic respiratory failure on 3 L continuous nasal cannula, severe COPD, chronic diastolic heart failure, type 2 diabetes mellitus, morbid obesity, chronic thrombocytopenia, who is admitted to [facility] on 08/28/2018 with acute on chronic hypoxic respiratory failure in the setting of acute on chronic diastolic heart failure and acute COPD exacerbation after presenting from home to the [facility] emergency department complaining of shortness of breath.  The patient reports 3 to 4 days of progressive shortness of breath associated with orthopnea and 3 to 5 pound weight gain over the last week.  She reports chronic nonproductive cough, without any significant worsening thereof recently.  She also reports chronic edema in the bilateral lower extremities, and has not noticed any significant recent worsening in that respect either.  She notes associated expiratory wheezing over the last 2 to 3 days, but denies any associated chest pain, diaphoresis, palpitations, nausea, vomiting, dizziness, or rash.  Denies rhinitis, rhinorrhea, sore throat, or diarrhea.  Denies any associated subjective fever, chills, rigors, or generalized myalgias.  She reports that her cousin with whom she lives, was diagnosed with pneumonia within the last week, but has been tested twice for COVID-19, with negative results on both occasions.  She denies any known or suspected COVID-19, and denies any recent traveling.   She confirms baseline oxygen requirement of 3 L continuous nasal cannula upon which her oxygen saturations via home pulse oximeter typically run in the low to mid 90s.  However, she has been noting pulse oximetry readings into the low to mid 80s over the last 2 to 3 days.  She reports good  compliance with her home respiratory regimen, which includes Symbicort.  Additionally, in the setting of a history of chronic diastolic heart failure, the patient also reports good compliance with her home diuretic regimen, which consists of torsemide 20 mg p.o. daily.  Of note, most recent echocardiogram occurred in November 2019 and showed mild LVH, EF 60 to 65%, no focal wall motion abnormalities, moderately dilated bilateral atria, but was not deemed to be technically sufficient in order to evaluate diastolic function.  This afternoon, in the setting of progressive shortness of breath and oxygen saturations persistently in the high 70s/low 80s, patient contacted EMS, who reportedly also noted initial oxygen saturations in the high 70s.  EMS reportedly administered duo nebulizer treatment x1, Solu-Medrol, and IV magnesium.  The patient was then brought to [facility] emergency department for further evaluation and management of her shortness of breath and acute on chronic hypoxia.   Patient was admitted with multifactorial etiology of acute on chronic hypoxemic and hypercapnic respiratory failure.  It appears that she had a component of acute COPD exacerbation along with possible acute on chronic diastolic heart failure.  She was started on Lasix IV as well as IV steroids and breathing treatments as a result.  She was additionally noted to have new onset atrial flutter/fibrillation with RVR and was subsequently started on a Cardizem drip for rate control.  Her ABGs demonstrated a pH of 7.19 and PCO2 of 111 as well as PO2 of 60.5.  She was placed on BiPAP overnight and was receiving nebulized breathing treatments and continues to have elevated heart rate this morning as well as some dyspnea that has improved.  Urine  output has remained poor since admission with only 400 mL noted in some concrement and urinary retention as well.  Patient does have a cousin who is currently admitted at [facility] long and has had  2 negative COVID 19 tests recently, but they are thought to be false negatives.  Procalcitonin is less than 0.10.  Respiratory viral panel is pending.  COVID swab will be obtained here and patient will be started on Eliquis due to Chads score 4.  Assessment & Plan:   Principal Problem:   Acute on chronic respiratory failure with hypoxia (HCC) Active Problems:   Type II diabetes mellitus with renal manifestations (HCC)   Chronic pain syndrome   Acute on chronic diastolic CHF (congestive heart failure) (HCC)   Hypothyroidism   Acute exacerbation of chronic obstructive pulmonary disease (COPD) (HCC)   Atrial flutter with rapid ventricular response ([place])   AKI (acute kidney injury) ([place])   1. Acute on chronic hypoxemic and hypercapnic respiratory failure likely secondary to COPD exacerbation suspect related to COVID-19.  Patient is currently high risk with recent exposure to cousin who is currently hospitalized at [facility] long under airborne precautions with suspected to false negative tests that were performed there.  Procalcitonin is noted to be less than 0.10 and chest x-ray demonstrates some patchy infiltrates suggestive of edema versus lung injury.  Respiratory panel ordered and pending.  Troponin level has been noted to be 0.03, will order d-dimer, CRP, LDH, ferritin, fibrinogen, LFTs, and IL-6 levels for further evaluation.  We will also order urine strep pneumonia and urine Legionella.  Patient has been weaned off BiPAP this morning and has been in no acute respiratory distress and is currently on 6 L high flow nasal cannula.  Repeat ABG with some improvement noted.  She will require transfer to [facility] long for COVID-19 rule out.  Continue breathing treatments as ordered and patient will require negative pressure room.  Continue IV steroids. 2. Mild acute on chronic diastolic heart failure likely secondary to new onset atrial fibrillation with RVR-related to above.  I believe that patient has  developed a cardiac arrhythmia related to her respiratory symptoms which has caused some heart failure, but overall she does not appear to be volume overloaded.  She has had very minimal urine output and is now tolerating the Lasix and therefore I will discontinue at this time.  She will require management of her respiratory condition as outlined above with heart rate control currently on Cardizem drip.  This can be weaned as her respiratory situation improves.  Will start on Eliquis for stroke prophylaxis given chads score 4. Monitor daily weights and I/O's. 3. AKI.  Current creatinine at 1.73 which is elevated from yesterday and I suspect that use of IV diuresis is contributing.  This has been discontinued at this time.  Plan to hold IV fluid at this time and monitor closely well with holding nephrotoxic agents and recheck labs in the a.m.  Consider initiation of gentle IV fluid as respiratory situation improves. 4. Type 2 diabetes.  Continue current insulin regimen and monitor closely while on IV steroids. 5. Acquired hypothyroidism.  Continue on Synthroid. 6. Chronic pain syndrome.  With holding home MS Contin at this time based on her respiratory situation.  May resume as appropriate.   DVT prophylaxis: Eliquis Code Status: Full Family Communication: We will attempt to contact listed family member. Disposition Plan: Transfer to [facility] for COVID rule out.   Consultants:   None  Procedures:  None  Antimicrobials:   None   Subjective: Patient seen and evaluated today with continued respiratory distress.  She is currently on BiPAP and would like to have the mask off as soon as possible.  She denies any other acute concerns at this point in time.  No significant overnight events noted.  Objective: Vitals:   08/29/18 0630 08/29/18 0645 08/29/18 0700 08/29/18 0811  BP: (!) 140/55 (!) 156/133 (!) 161/47   Pulse: (!) 47 (!) 50 (!) 108   Resp: 16 18 16    Temp:      TempSrc:        SpO2: 93% 94% 92% 92%  Weight:      Height:        Intake/Output Summary (Last 24 hours) at 08/29/2018 0903 Last data filed at 08/29/2018 0626 Gross per 24 hour  Intake 743.34 ml  Output 400 ml  Net 343.34 ml   Filed Weights   08/28/18 1735 08/28/18 2226 08/29/18 0500  Weight: 124.3 kg 130.8 kg 130.8 kg    Examination:  General exam: Appears calm and comfortable, obese Respiratory system: Diminished to auscultation at bases. Respiratory effort normal.  Currently on BiPAP FiO2 40%. Cardiovascular system: S1 & S2 heard, irregular and tachycardic. No JVD, murmurs, rubs, gallops or clicks. No pedal edema. Gastrointestinal system: Abdomen is nondistended, soft and nontender. No organomegaly or masses felt. Normal bowel sounds heard. Central nervous system: Alert and responsive. Extremities: Symmetric 5 x 5 power. Skin: No rashes, lesions or ulcers Psychiatry: Cannot be evaluated at this time.    Data Reviewed: I have personally reviewed following labs and imaging studies  CBC: Recent Labs  Lab 08/28/18 1824 08/29/18 0412  WBC 5.7 3.4*  NEUTROABS 5.4 3.1  HGB 9.2* 9.6*  HCT 34.2* 35.3*  MCV 88.1 88.9  PLT 66* 66*   Basic Metabolic Panel: Recent Labs  Lab 08/28/18 1824 08/29/18 0412  NA 139 139  K 4.5 4.7  CL 89* 88*  CO2 39* 38*  GLUCOSE 153* 171*  BUN 47* 52*  CREATININE 1.63* 1.77*  CALCIUM 8.5* 8.4*  MG 2.5* 2.3  PHOS  --  7.0*   GFR: Estimated Creatinine Clearance: 42.6 mL/min (A) (by C-G formula based on SCr of 1.77 mg/dL (H)). Liver Function Tests: Recent Labs  Lab 08/28/18 1824 08/29/18 0412  AST 16 16  ALT 10 10  ALKPHOS 62 61  BILITOT 1.0 1.0  PROT 7.0 6.9  ALBUMIN 4.0 3.8   No results for input(s): LIPASE, AMYLASE in the last 168 hours. No results for input(s): AMMONIA in the last 168 hours. Coagulation Profile: No results for input(s): INR, PROTIME in the last 168 hours. Cardiac Enzymes: Recent Labs  Lab 08/28/18 1824  TROPONINI  0.03*   BNP (last 3 results) No results for input(s): PROBNP in the last 8760 hours. HbA1C: No results for input(s): HGBA1C in the last 72 hours. CBG: Recent Labs  Lab 08/28/18 2219 08/29/18 0804  GLUCAP 136* 155*   Lipid Profile: No results for input(s): CHOL, HDL, LDLCALC, TRIG, CHOLHDL, LDLDIRECT in the last 72 hours. Thyroid Function Tests: Recent Labs    08/29/18 0412  TSH 1.372   Anemia Panel: No results for input(s): VITAMINB12, FOLATE, FERRITIN, TIBC, IRON, RETICCTPCT in the last 72 hours. Sepsis Labs: Recent Labs  Lab 08/29/18 0412  PROCALCITON <0.10    Recent Results (from the past 240 hour(s))  MRSA PCR Screening     Status: None   Collection Time: 08/28/18  9:58 PM  Result Value Ref Range Status   MRSA by PCR NEGATIVE NEGATIVE Final    Comment:        The GeneXpert MRSA Assay (FDA approved for NASAL specimens only), is one component of a comprehensive MRSA colonization surveillance program. It is not intended to diagnose MRSA infection nor to guide or monitor treatment for MRSA infections. Performed at [facility], [address], [place], [place] [address]          Radiology Studies: Dg Chest Portable 1 View  Result Date: 08/28/2018 CLINICAL DATA:  63 year old female with a history of shortness of breath EXAM: PORTABLE CHEST 1 VIEW COMPARISON:  07/14/2018, 06/08/2018 FINDINGS: Cardiomediastinal silhouette unchanged in size and contour with cardiomegaly. Mixed interstitial and airspace opacities of the bilateral lungs, worse than the comparison. Retrocardiac opacity with obscuration of the left hemidiaphragm and blunting of left costophrenic angle. Partial obscuration of the right hemidiaphragm with blunting of the costophrenic angle. IMPRESSION: Mixed interstitial and airspace opacities of the bilateral lungs, worse than the comparison, with the differential including multifocal infection and/or edema. Likely bibasilar pleural effusions and  associated atelectasis/consolidation. Cardiomegaly Electronically Signed   By: Corrie Mckusick D.O.   On: 08/28/2018 18:38        Scheduled Meds:  apixaban  5 mg Oral BID   insulin aspart  0-15 Units Subcutaneous TID WC   insulin glargine  20 Units Subcutaneous Q breakfast   ipratropium  0.5 mg Nebulization Q6H WA   levalbuterol  0.63 mg Nebulization Q6H WA   levothyroxine  88 mcg Oral Q0600   mouth rinse  15 mL Mouth Rinse BID   methylPREDNISolone (SOLU-MEDROL) injection  40 mg Intravenous Q6H   morphine  30 mg Oral Q12H   nystatin   Topical TID   pravastatin  20 mg Oral QPM   Continuous Infusions:  diltiazem (CARDIZEM) infusion       LOS: 1 day    Time spent: Approximately 60 minutes of critical care time spent on evaluation management of this patient.    Darcey Demma Darleen Crocker, DO [facility] Pager [phone]  If 7PM-7AM, please contact night-coverage [email or web address] Password [facility] 08/29/2018, 9:03 AM

## 2018-08-30 ENCOUNTER — Inpatient Hospital Stay (HOSPITAL_COMMUNITY): Payer: Medicare Other

## 2018-08-30 DIAGNOSIS — L8990 Pressure ulcer of unspecified site, unspecified stage: Secondary | ICD-10-CM | POA: Diagnosis present

## 2018-08-30 DIAGNOSIS — J9621 Acute and chronic respiratory failure with hypoxia: Principal | ICD-10-CM

## 2018-08-30 LAB — BLOOD GAS, ARTERIAL
Acid-Base Excess: 7.9 mmol/L — ABNORMAL HIGH (ref 0.0–2.0)
Acid-Base Excess: 10 mmol/L — ABNORMAL HIGH (ref 0.0–2.0)
Acid-Base Excess: 9.5 mmol/L — ABNORMAL HIGH (ref 0.0–2.0)
Bicarbonate: 39.1 mmol/L — ABNORMAL HIGH (ref 20.0–28.0)
Bicarbonate: 38.1 mmol/L — ABNORMAL HIGH (ref 20.0–28.0)
Bicarbonate: 36.5 mmol/L — ABNORMAL HIGH (ref 20.0–28.0)
Delivery systems: 7
Drawn by: 257701
Drawn by: 317771
Drawn by: 42246
FIO2: 1
FIO2: 60
MECHVT: 430 mL
MECHVT: 430 mL
O2 Content: 7 L/min
O2 Saturation: 93.6 %
O2 Saturation: 98.7 %
PEEP: 5 cmH2O
PEEP: 5 cmH2O
Patient temperature: 97.5
Patient temperature: 97.5
Patient temperature: 36.9
RATE: 20 resp/min
RATE: 20 resp/min
pCO2 arterial: 111 mmHg (ref 32.0–48.0)
pCO2 arterial: 80 mmHg (ref 32.0–48.0)
pCO2 arterial: 71.2 mmHg (ref 32.0–48.0)
pH, Arterial: 7.17 — CL (ref 7.350–7.450)
pH, Arterial: 7.295 — ABNORMAL LOW (ref 7.350–7.450)
pH, Arterial: 7.329 — ABNORMAL LOW (ref 7.350–7.450)
pO2, Arterial: 78.7 mmHg — ABNORMAL LOW (ref 83.0–108.0)
pO2, Arterial: 338 mmHg — ABNORMAL HIGH (ref 83.0–108.0)
pO2, Arterial: 120 mmHg — ABNORMAL HIGH (ref 83.0–108.0)

## 2018-08-30 LAB — BASIC METABOLIC PANEL
Anion gap: 11 (ref 5–15)
BUN: 73 mg/dL — ABNORMAL HIGH (ref 8–23)
CO2: 35 mmol/L — ABNORMAL HIGH (ref 22–32)
Calcium: 7.2 mg/dL — ABNORMAL LOW (ref 8.9–10.3)
Chloride: 88 mmol/L — ABNORMAL LOW (ref 98–111)
Creatinine, Ser: 2.73 mg/dL — ABNORMAL HIGH (ref 0.44–1.00)
GFR calc Af Amer: 20 mL/min — ABNORMAL LOW (ref >60)
GFR calc non Af Amer: 18 mL/min — ABNORMAL LOW (ref >60)
Glucose, Bld: 176 mg/dL — ABNORMAL HIGH (ref 70–99)
Potassium: 5.4 mmol/L — ABNORMAL HIGH (ref 3.5–5.1)
Sodium: 134 mmol/L — ABNORMAL LOW (ref 135–145)

## 2018-08-30 LAB — LACTIC ACID, PLASMA: Lactic Acid, Venous: 1.4 mmol/L (ref 0.5–1.9)

## 2018-08-30 LAB — COMPREHENSIVE METABOLIC PANEL
ALT: 12 U/L (ref 0–44)
AST: 17 U/L (ref 15–41)
Albumin: 4 g/dL (ref 3.5–5.0)
Alkaline Phosphatase: 60 U/L (ref 38–126)
Anion gap: 11 (ref 5–15)
BUN: 65 mg/dL — ABNORMAL HIGH (ref 8–23)
CO2: 38 mmol/L — ABNORMAL HIGH (ref 22–32)
Calcium: 8.1 mg/dL — ABNORMAL LOW (ref 8.9–10.3)
Chloride: 85 mmol/L — ABNORMAL LOW (ref 98–111)
Creatinine, Ser: 2.42 mg/dL — ABNORMAL HIGH (ref 0.44–1.00)
GFR calc Af Amer: 24 mL/min — ABNORMAL LOW (ref >60)
GFR calc non Af Amer: 20 mL/min — ABNORMAL LOW (ref >60)
Glucose, Bld: 207 mg/dL — ABNORMAL HIGH (ref 70–99)
Potassium: 5.2 mmol/L — ABNORMAL HIGH (ref 3.5–5.1)
Sodium: 134 mmol/L — ABNORMAL LOW (ref 135–145)
Total Bilirubin: 0.5 mg/dL (ref 0.3–1.2)
Total Protein: 7.3 g/dL (ref 6.5–8.1)

## 2018-08-30 LAB — CBC
HCT: 36.6 % (ref 36.0–46.0)
Hemoglobin: 10 g/dL — ABNORMAL LOW (ref 12.0–15.0)
MCH: 24.1 pg — ABNORMAL LOW (ref 26.0–34.0)
MCHC: 27.3 g/dL — ABNORMAL LOW (ref 30.0–36.0)
MCV: 88.2 fL (ref 80.0–100.0)
Platelets: 104 10*3/uL — ABNORMAL LOW (ref 150–400)
RBC: 4.15 MIL/uL (ref 3.87–5.11)
RDW: 17.1 % — ABNORMAL HIGH (ref 11.5–15.5)
WBC: 7.3 10*3/uL (ref 4.0–10.5)
nRBC: 0 % (ref 0.0–0.2)

## 2018-08-30 LAB — GLUCOSE, CAPILLARY
Glucose-Capillary: 197 mg/dL — ABNORMAL HIGH (ref 70–99)
Glucose-Capillary: 250 mg/dL — ABNORMAL HIGH (ref 70–99)
Glucose-Capillary: 322 mg/dL — ABNORMAL HIGH (ref 70–99)
Glucose-Capillary: 166 mg/dL — ABNORMAL HIGH (ref 70–99)
Glucose-Capillary: 173 mg/dL — ABNORMAL HIGH (ref 70–99)

## 2018-08-30 LAB — MAGNESIUM: Magnesium: 2.2 mg/dL (ref 1.7–2.4)

## 2018-08-30 LAB — PROCALCITONIN: Procalcitonin: <0.1 ng/mL

## 2018-08-30 LAB — PHOSPHORUS: Phosphorus: 8 mg/dL — ABNORMAL HIGH (ref 2.5–4.6)

## 2018-08-30 LAB — INTERLEUKIN-6, PLASMA

## 2018-08-30 MED ORDER — INSULIN ASPART 100 UNIT/ML ~~LOC~~ SOLN
3.0000 [IU] | SUBCUTANEOUS | Status: DC
  Administered 2018-08-30 – 2018-08-31 (×3): 6 [IU] via SUBCUTANEOUS
  Administered 2018-08-31 (×3): 9 [IU] via SUBCUTANEOUS
  Administered 2018-08-31: 6 [IU] via SUBCUTANEOUS
  Administered 2018-08-31 – 2018-09-01 (×3): 9 [IU] via SUBCUTANEOUS
  Administered 2018-09-01: 16:00:00 3 [IU] via SUBCUTANEOUS
  Administered 2018-09-01: 6 [IU] via SUBCUTANEOUS

## 2018-08-30 MED ORDER — PANTOPRAZOLE SODIUM 40 MG PO PACK
40.0000 mg | PACK | ORAL | Status: DC
  Administered 2018-08-30 – 2018-09-01 (×3): 40 mg
  Filled 2018-08-30 (×3): qty 20

## 2018-08-30 MED ORDER — DOCUSATE SODIUM 50 MG/5ML PO LIQD: 100.0000 mg | Freq: Two times a day (BID) | ORAL | Status: DC | PRN

## 2018-08-30 MED ORDER — FENTANYL 2500MCG IN NS 250ML (10MCG/ML) PREMIX INFUSION
INTRAVENOUS | Status: AC
  Filled 2018-08-30: qty 250

## 2018-08-30 MED ORDER — ORAL CARE MOUTH RINSE
15.0000 mL | OROMUCOSAL | Status: DC
  Administered 2018-08-30 – 2018-09-01 (×12): 15 mL via OROMUCOSAL

## 2018-08-30 MED ORDER — FENTANYL CITRATE (PF) 100 MCG/2ML IJ SOLN: 100.0000 ug | Freq: Once | INTRAMUSCULAR | Status: DC

## 2018-08-30 MED ORDER — IPRATROPIUM-ALBUTEROL 0.5-2.5 (3) MG/3ML IN SOLN
3.0000 mL | Freq: Four times a day (QID) | RESPIRATORY_TRACT | Status: DC
  Administered 2018-08-30 – 2018-09-04 (×14): 3 mL via RESPIRATORY_TRACT
  Filled 2018-08-30 (×19): qty 3

## 2018-08-30 MED ORDER — DEXMEDETOMIDINE HCL IN NACL 200 MCG/50ML IV SOLN
0.0000 ug/kg/h | INTRAVENOUS | Status: DC
  Administered 2018-08-30: 20:00:00 0.3 ug/kg/h via INTRAVENOUS
  Administered 2018-08-31: 0.8 ug/kg/h via INTRAVENOUS
  Administered 2018-08-31 (×2): 0.6 ug/kg/h via INTRAVENOUS
  Administered 2018-08-31: 09:00:00 0.4 ug/kg/h via INTRAVENOUS
  Administered 2018-08-31: 0.8 ug/kg/h via INTRAVENOUS
  Administered 2018-08-31 (×2): 0.6 ug/kg/h via INTRAVENOUS
  Administered 2018-09-01 (×2): 1 ug/kg/h via INTRAVENOUS
  Filled 2018-08-30 (×11): qty 50
  Filled 2018-08-30: qty 100
  Filled 2018-08-30: qty 50

## 2018-08-30 MED ORDER — FENTANYL CITRATE (PF) 100 MCG/2ML IJ SOLN
INTRAMUSCULAR | Status: AC
  Administered 2018-08-30: 100 ug
  Filled 2018-08-30: qty 2

## 2018-08-30 MED ORDER — ETOMIDATE 2 MG/ML IV SOLN
10.0000 mg | Freq: Once | INTRAVENOUS | Status: AC
  Administered 2018-08-30: 19:00:00 10 mg via INTRAVENOUS

## 2018-08-30 MED ORDER — ROCURONIUM BROMIDE 50 MG/5ML IV SOLN
80.0000 mg | Freq: Once | INTRAVENOUS | Status: AC
  Administered 2018-08-30: 19:00:00 80 mg via INTRAVENOUS

## 2018-08-30 MED ORDER — PRAVASTATIN SODIUM 20 MG PO TABS
20.0000 mg | ORAL_TABLET | Freq: Every evening | ORAL | Status: DC
  Administered 2018-08-30 – 2018-09-01 (×3): 20 mg
  Filled 2018-08-30 (×3): qty 1

## 2018-08-30 MED ORDER — SODIUM CHLORIDE 0.9 % IV SOLN
INTRAVENOUS | Status: DC
  Administered 2018-08-30 (×2): via INTRAVENOUS

## 2018-08-30 MED ORDER — VITAL HIGH PROTEIN PO LIQD
1000.0000 mL | ORAL | Status: AC
  Administered 2018-08-30: 1000 mL

## 2018-08-30 MED ORDER — ZOLPIDEM TARTRATE 5 MG PO TABS
5.0000 mg | ORAL_TABLET | Freq: Every evening | ORAL | Status: DC | PRN
  Administered 2018-08-30: 01:00:00 5 mg via ORAL
  Filled 2018-08-30: qty 1

## 2018-08-30 MED ORDER — PRO-STAT SUGAR FREE PO LIQD
30.0000 mL | Freq: Two times a day (BID) | ORAL | Status: DC
  Administered 2018-08-30 – 2018-08-31 (×2): 30 mL
  Filled 2018-08-30 (×2): qty 30

## 2018-08-30 MED ORDER — APIXABAN 5 MG PO TABS
5.0000 mg | ORAL_TABLET | Freq: Two times a day (BID) | ORAL | Status: DC
  Administered 2018-08-30 – 2018-09-02 (×7): 5 mg
  Filled 2018-08-30 (×7): qty 1

## 2018-08-30 MED ORDER — DEXMEDETOMIDINE HCL IN NACL 400 MCG/100ML IV SOLN
INTRAVENOUS | Status: AC
  Filled 2018-08-30: qty 100

## 2018-08-30 MED ORDER — SODIUM CHLORIDE 0.9 % IV SOLN
1.0000 g | INTRAVENOUS | Status: DC
  Administered 2018-08-30 – 2018-09-01 (×3): 1 g via INTRAVENOUS
  Filled 2018-08-30: qty 1
  Filled 2018-08-30: qty 10
  Filled 2018-08-30 (×2): qty 1

## 2018-08-30 MED ORDER — FENTANYL BOLUS VIA INFUSION
25.0000 ug | INTRAVENOUS | Status: DC | PRN
  Administered 2018-08-31 – 2018-09-01 (×7): 25 ug via INTRAVENOUS
  Filled 2018-08-30: qty 25

## 2018-08-30 MED ORDER — FENTANYL CITRATE (PF) 100 MCG/2ML IJ SOLN
INTRAMUSCULAR | Status: AC
  Filled 2018-08-30: qty 2

## 2018-08-30 MED ORDER — ALBUTEROL SULFATE (2.5 MG/3ML) 0.083% IN NEBU
2.5000 mg | INHALATION_SOLUTION | RESPIRATORY_TRACT | Status: DC | PRN
  Administered 2018-09-02: 18:00:00 2.5 mg via RESPIRATORY_TRACT
  Filled 2018-08-30: qty 3

## 2018-08-30 MED ORDER — ACETAMINOPHEN 325 MG PO TABS
650.0000 mg | ORAL_TABLET | Freq: Four times a day (QID) | ORAL | Status: DC | PRN
  Administered 2018-09-02: 01:00:00 650 mg
  Filled 2018-08-30: qty 2

## 2018-08-30 MED ORDER — SODIUM CHLORIDE 0.9 % IV BOLUS
1000.0000 mL | Freq: Once | INTRAVENOUS | Status: AC
  Administered 2018-08-30: 1000 mL via INTRAVENOUS

## 2018-08-30 MED ORDER — MIDAZOLAM HCL 2 MG/2ML IJ SOLN
INTRAMUSCULAR | Status: AC
  Administered 2018-08-30: 19:00:00
  Filled 2018-08-30: qty 2

## 2018-08-30 MED ORDER — DILTIAZEM HCL 30 MG PO TABS
30.0000 mg | ORAL_TABLET | Freq: Four times a day (QID) | ORAL | Status: DC
  Administered 2018-08-30 (×2): 30 mg via ORAL
  Filled 2018-08-30 (×2): qty 1

## 2018-08-30 MED ORDER — SODIUM CHLORIDE 0.9% FLUSH
10.0000 mL | INTRAVENOUS | Status: DC | PRN
  Administered 2018-09-04: 20 mL
  Administered 2018-09-05 – 2018-09-15 (×3): 10 mL
  Filled 2018-08-30 (×4): qty 40

## 2018-08-30 MED ORDER — ORAL CARE MOUTH RINSE: 15.0000 mL | OROMUCOSAL | Status: DC

## 2018-08-30 MED ORDER — SODIUM CHLORIDE 0.9% FLUSH
10.0000 mL | Freq: Two times a day (BID) | INTRAVENOUS | Status: DC
  Administered 2018-08-30: 30 mL
  Administered 2018-08-31: 22:00:00 10 mL
  Administered 2018-08-31: 09:00:00 30 mL
  Administered 2018-09-01 – 2018-09-10 (×15): 10 mL
  Administered 2018-09-10: 20 mL
  Administered 2018-09-11: 10 mL
  Administered 2018-09-11: 30 mL
  Administered 2018-09-12: 20 mL
  Administered 2018-09-13: 10 mL

## 2018-08-30 MED ORDER — MIDAZOLAM HCL 2 MG/2ML IJ SOLN: 2.0000 mg | Freq: Once | INTRAMUSCULAR | Status: DC

## 2018-08-30 MED ORDER — CHLORHEXIDINE GLUCONATE 0.12% ORAL RINSE (MEDLINE KIT)
15.0000 mL | Freq: Two times a day (BID) | OROMUCOSAL | Status: DC
  Administered 2018-08-30: 15 mL via OROMUCOSAL

## 2018-08-30 MED ORDER — ACETAMINOPHEN 650 MG RE SUPP: 650.0000 mg | Freq: Four times a day (QID) | RECTAL | Status: DC | PRN

## 2018-08-30 MED ORDER — FENTANYL 2500MCG IN NS 250ML (10MCG/ML) PREMIX INFUSION
25.0000 ug/h | INTRAVENOUS | Status: DC
  Administered 2018-08-30 – 2018-09-01 (×3): 150 ug/h via INTRAVENOUS
  Filled 2018-08-30 (×3): qty 250

## 2018-08-30 MED ORDER — SODIUM CHLORIDE 0.9 % IV BOLUS
500.0000 mL | Freq: Once | INTRAVENOUS | Status: AC
  Administered 2018-08-30: 500 mL via INTRAVENOUS

## 2018-08-30 MED ORDER — BISACODYL 10 MG RE SUPP: 10.0000 mg | Freq: Every day | RECTAL | Status: DC | PRN

## 2018-08-30 MED ORDER — MIDAZOLAM HCL 2 MG/2ML IJ SOLN
1.0000 mg | INTRAMUSCULAR | Status: DC | PRN
  Administered 2018-08-30 – 2018-08-31 (×2): 1 mg via INTRAVENOUS
  Filled 2018-08-30 (×2): qty 2

## 2018-08-30 MED ORDER — LEVOTHYROXINE SODIUM 88 MCG PO TABS
88.0000 ug | ORAL_TABLET | Freq: Every day | ORAL | Status: DC
  Administered 2018-08-31 – 2018-09-01 (×3): 88 ug
  Filled 2018-08-30 (×3): qty 1

## 2018-08-30 MED ORDER — CHLORHEXIDINE GLUCONATE 0.12% ORAL RINSE (MEDLINE KIT)
15.0000 mL | Freq: Two times a day (BID) | OROMUCOSAL | Status: DC
  Administered 2018-08-31 – 2018-09-08 (×10): 15 mL via OROMUCOSAL

## 2018-08-30 MED ORDER — CHLORHEXIDINE GLUCONATE CLOTH 2 % EX PADS
6.0000 | MEDICATED_PAD | Freq: Every day | CUTANEOUS | Status: DC
  Administered 2018-08-31 – 2018-09-06 (×4): 6 via TOPICAL

## 2018-08-30 MED ORDER — CHLORHEXIDINE GLUCONATE CLOTH 2 % EX PADS
6.0000 | MEDICATED_PAD | Freq: Every day | CUTANEOUS | Status: DC
  Administered 2018-08-30: 21:00:00 6 via TOPICAL

## 2018-08-30 MED ORDER — SODIUM CHLORIDE 0.9 % IV SOLN
500.0000 mg | INTRAVENOUS | Status: DC
  Administered 2018-08-30 – 2018-09-01 (×3): 500 mg via INTRAVENOUS
  Filled 2018-08-30 (×3): qty 500

## 2018-08-30 MED ORDER — DILTIAZEM HCL 30 MG PO TABS
30.0000 mg | ORAL_TABLET | Freq: Four times a day (QID) | ORAL | Status: DC
  Administered 2018-08-30 – 2018-09-01 (×7): 30 mg
  Filled 2018-08-30 (×7): qty 1

## 2018-08-30 NOTE — Progress Notes (Signed)
[place] Progress Note Patient Name: JUSTYNA TIMONEY DOB: [birth year] MRN: [id number]   Date of Service  08/30/2018  HPI/Events of Note  ABG on 100%/PRVC 18/TV 430/P 5 = 7.295/80/338/38.1  eICU Interventions  Will order: 1. Increase PRVC rate to 20. 2. Repeat ABG at 11 PM.     Intervention Category Major Interventions: Acid-Base disturbance - evaluation and management;Respiratory failure - evaluation and management  Lysle Dingwall 08/30/2018, 8:57 PM

## 2018-08-30 NOTE — Progress Notes (Signed)
Inpatient Diabetes Program Recommendations  AACE/ADA: New Consensus Statement on Inpatient Glycemic Control (2015)  Target Ranges:  Prepandial:   less than 140 mg/dL      Peak postprandial:   less than 180 mg/dL (1-2 hours)      Critically ill patients:  140 - 180 mg/dL   Lab Results  Component Value Date   GLUCAP 322 (H) 08/30/2018   HGBA1C 5.6 06/12/2018    Review of Glycemic Control  Diabetes history: DM2 Outpatient Diabetes medications: Lantus 31 units QAM, Humalog 15 units tidwc Current orders for Inpatient glycemic control: Lantus 20 units QAM, Novolog 0-15 units tidwc  Last HgbA1C - 06/12/2018 - 5.6% COVID 19 test pending On Solumedrol 40 mg Q6H  Inpatient Diabetes Program Recommendations:     Add Novolog 8 units tidwc for meal coverage insulin if pt eats > 50% meal Add HS correction If FBS in am is > 180 mg/dL, increase Lantus to 25 units QHS.  Will follow closely.  Thank you. Lorenda Peck, RD, LDN, CDE Inpatient Diabetes Coordinator [phone]

## 2018-08-30 NOTE — Progress Notes (Signed)
[place] Progress Note Patient Name: Renee Noble DOB: [birth year] MRN: [id number]   Date of Service  08/30/2018  HPI/Events of Note  ABG on 60%/PRVC 20/TV 430/P 5 = 7.329/71.2/120. Patient weight = 138.1 Kg. She might need increased PEEP to increase her FRC.  eICU Interventions  Will order: 1. Increase PEEP to 8.      Intervention Category Major Interventions: Acid-Base disturbance - evaluation and management;Respiratory failure - evaluation and management  Lysle Dingwall 08/30/2018, 11:43 PM

## 2018-08-30 NOTE — Procedures (Signed)
Intubation Procedure Note Renee Noble [id number] August 07, 1955  Procedure: Intubation Indications: Respiratory insufficiency  Procedure Details Consent: Unable to obtain consent because of altered level of consciousness. Time Out: Verified patient identification, verified procedure, site/side was marked, verified correct patient position, special equipment/implants available, medications/allergies/relevent history reviewed, required imaging and test results available.  Performed  Maximum sterile technique was used including cap, gloves, gown, hand hygiene and mask.  MAC and 3  Given 2 mg versed, 100 mcg fentanyl, 10 mg etomidate, 80 mg rocuronium.  Inserted 7.5 ETT to 23 cm at lip using glidescope.  Confirmed with CO2 detector and ausculation.  Evaluation Hemodynamic Status: BP stable throughout; O2 sats: stable throughout Patient's Current Condition: stable Complications: No apparent complications Patient did tolerate procedure well. Chest X-ray ordered to verify placement.  CXR: pending.   Renee Noble 08/30/2018

## 2018-08-30 NOTE — Progress Notes (Signed)
[place] Progress Note Patient Name: Renee Noble DOB: Jul 16, 1955 MRN: [id number]   Date of Service  08/30/2018  HPI/Events of Note  Hypotension - BP = 79/43 and MAP = 55.  eICU Interventions  Will order: 1. Bolus with 0.9 NaCl 1 liter IV over 1 hour now. 2. Monitor CVP now and Q 4 hours.      Intervention Category Major Interventions: Hypotension - evaluation and management  Sommer,Steven Eugene 08/30/2018, 11:49 PM

## 2018-08-30 NOTE — Consult Note (Addendum)
NAME:  Renee Noble, MRN:  [id number], DOB:  1955-08-27, LOS: 2 ADMISSION DATE:  08/28/2018, CONSULTATION DATE:  08/29/2018 REFERRING MD:  Dr. Ree Kida, [facility], CHIEF COMPLAINT:  Short of breath   History   63 yo female former smoker presented to [facility] 4/13 with progressive dyspnea x 4 days.  She lives with her cousin who was dx with PNA (reported COVID test negative).  CXR showed changes of pneumonia.  Initial COVID test was negative, but repeat result pending.  Developed progressive hypercapnia with altered mental status and required intubation 4/15.  Past Medical History  Severe COPD, chronic respiratory failure on 3 liters oxygen, diastolic CHF, DM II, Hypothyroidism, OSA, Depression, Chronic pain  Significant Hospital Events   4/13 Admit 4/15 VDRF  Consults:    Procedures:  ETT 4/15 >> Lt Carpenter CVL 4/15 >>   COVID Testing:  Procalcitonin 4/14 >> less than 0.10 D dimer 4/14 >> 2.13 CRP 4/14 >> less than 0.8 LDH 4/14 >> 107 Ferritin 4/14 >> 26 Fibrinogen 4/14 >> 330 IL-6 4/15 >>   Micro Data:  RSV 4/14 >> negative COVID 4/14 >> negative Pneumococcal Ag 4/14 >> negative Legionella Ag 4/14 >>  COVID 4/15 >>   Antimicrobials:  Rocephin 4/15 >> Zithromax 4/15 >>   Interim history/subjective:    Objective   Blood pressure 129/79, pulse (!) 112, temperature (!) 97.5 F (36.4 C), temperature source Oral, resp. rate 17, height 5\' 4"  (1.626 m), weight (!) 138.1 kg, SpO2 93 %.    Vent Mode: PRVC FiO2 (%):  [100 %] 100 % Set Rate:  [18 bmp] 18 bmp Vt Set:  [430 mL] 430 mL PEEP:  [5 cmH20] 5 cmH20 Plateau Pressure:  [35 cmH20] 35 cmH20   Intake/Output Summary (Last 24 hours) at 08/30/2018 1937 Last data filed at 08/30/2018 1300 Gross per 24 hour  Intake 817.92 ml  Output 200 ml  Net 617.92 ml   Filed Weights   08/28/18 2226 08/29/18 0500 08/30/18 0300  Weight: 130.8 kg 130.8 kg (!) 138.1 kg    Examination:  General - confused Eyes - pupils reactive ENT - dry  mucosa, wears dentures Cardiac - irregular, no murmur Chest - b/l rhonchi Abdomen - soft, non tender, + bowel sounds Extremities - 1+ edema Skin - chronic venous stasis changes and scaling on lower extremities Neuro - speaks, but not making sense.  Moves extremities.   Resolved Hospital Problem list     Assessment & Plan:   Acute on chronic hypoxic, hypercapnic respiratory failure from CAP and AECOPD. Plan - intubated - f/u CXR, ABG - scheduled BDs - continue solumedrol  CAP. Discussion: Initial COVID negative, but still concern for COVID due to home contact also having pneumonia. Plan - airborne and contact precautions - f/u COVID from 4/15 - tracheal aspirate for culture - add rocephin, zithromax  A fib with RVR. Hx of HTN with chronic diastolic CHF, HLD. Plan - continue cardizem, eliquis, pravachol  AKI from prerenal azotemia >> baseline creatinine 1.17 from 07/31/18. Acute urine retention. Plan - add IV fluid - f/u BMET - place foley  DM type II with steroid induced hyperglycemia. Hx of hypothyroidism. Plan - SSI with lantus 20 units daily - continue synthroid  Acute metabolic encephalopathy. Hx of chronic pain, depression. Plan - precedex, fentanyl gtt with prn versed for RASS goal 0 to -1 - hold outpt pamelor, MS contin   Best practice:  Diet: tube feeds DVT prophylaxis: eliquis GI prophylaxis: protonix Mobility: bed  rest Code Status: full code Family Communication: updated by Dr. Ree Kida Disposition: ICU  Labs   CBC: Recent Labs  Lab 08/28/18 1824 08/29/18 0412 08/30/18 0320  WBC 5.7 3.4* 7.3  NEUTROABS 5.4 3.1  --   HGB 9.2* 9.6* 10.0*  HCT 34.2* 35.3* 36.6  MCV 88.1 88.9 88.2  PLT 66* 66* 104*    Basic Metabolic Panel: Recent Labs  Lab 08/28/18 1824 08/29/18 0412 08/30/18 0320  NA 139 139 134*  K 4.5 4.7 5.2*  CL 89* 88* 85*  CO2 39* 38* 38*  GLUCOSE 153* 171* 207*  BUN 47* 52* 65*  CREATININE 1.63* 1.77* 2.42*   CALCIUM 8.5* 8.4* 8.1*  MG 2.5* 2.3  --   PHOS  --  7.0*  --    GFR: Estimated Creatinine Clearance: 32.2 mL/min (A) (by C-G formula based on SCr of 2.42 mg/dL (H)). Recent Labs  Lab 08/28/18 1824 08/29/18 0412 08/30/18 0320  PROCALCITON  --  <0.10 <0.10  WBC 5.7 3.4* 7.3  LATICACIDVEN  --   --  1.4    Liver Function Tests: Recent Labs  Lab 08/28/18 1824 08/29/18 0412 08/30/18 0320  AST 16 16 17   ALT 10 10 12   ALKPHOS 62 61 60  BILITOT 1.0 1.0 0.5  PROT 7.0 6.9 7.3  ALBUMIN 4.0 3.8 4.0   No results for input(s): LIPASE, AMYLASE in the last 168 hours. No results for input(s): AMMONIA in the last 168 hours.  ABG    Component Value Date/Time   PHART 7.170 (LL) 08/30/2018 1802   PCO2ART 111 (HH) 08/30/2018 1802   PO2ART 78.7 (L) 08/30/2018 1802   HCO3 39.1 (H) 08/30/2018 1802   TCO2 36.8 07/15/2015 0425   O2SAT 93.6 08/30/2018 1802     Coagulation Profile: No results for input(s): INR, PROTIME in the last 168 hours.  Cardiac Enzymes: Recent Labs  Lab 08/28/18 1824  TROPONINI 0.03*    HbA1C: Hgb A1c MFr Bld  Date/Time Value Ref Range Status  06/12/2018 04:10 AM 5.6 4.8 - 5.6 % Final    Comment:    (NOTE) Pre diabetes:          5.7%-6.4% Diabetes:              >6.4% Glycemic control for   <7.0% adults with diabetes   04/09/2018 05:39 AM 5.2 4.8 - 5.6 % Final    Comment:    (NOTE) Pre diabetes:          5.7%-6.4% Diabetes:              >6.4% Glycemic control for   <7.0% adults with diabetes     CBG: Recent Labs  Lab 08/29/18 1611 08/29/18 2105 08/30/18 0801 08/30/18 1132 08/30/18 1549  GLUCAP 176* 208* 197* 250* 322*    Review of Systems:   Unable to obtain  Past Medical History  She,  has a past medical history of Abdominal mass of other site, Cervical compression fracture (HCC), CHF (congestive heart failure) ([place]), Chronic kidney disease, stage 3 (HCC), Chronic lower limb pain, COPD (chronic obstructive pulmonary disease) ([place]),  CTS (carpal tunnel syndrome), Depression, Diabetes ([place]), Fibromyalgia, Hepatitis C, Hypercholesteremia, Hypertension, Hypothyroidism, Morbid obesity ([place]), Neuropathy, OSA (obstructive sleep apnea), Osteoarthritis, Renal cancer ([place]), RLS (restless legs syndrome), Syncope, and Venous stasis.   Surgical History    Past Surgical History:  Procedure Laterality Date  . ABDOMINAL HERNIA REPAIR     x2  . ABDOMINAL HYSTERECTOMY    . BLADDER SUSPENSION    .  CHOLECYSTECTOMY    . CYST EXCISION     Head  . INCISIONAL HERNIA REPAIR    . KNEE ARTHROSCOPY     Bilateral  . NEPHRECTOMY     Left  . TONSILLECTOMY    . TOOTH EXTRACTION    . UMBILICAL HERNIA REPAIR    . VAGINA SURGERY    . WISDOM TOOTH EXTRACTION       Social History   reports that she quit smoking about 2 years ago. Her smoking use included cigarettes. She has a 112.50 pack-year smoking history. She has never used smokeless tobacco. She reports that she does not drink alcohol or use drugs.   Family History   Her family history includes Alcoholism in her father, mother, and paternal grandfather; Brain cancer in her maternal aunt; Breast cancer in her maternal aunt; COPD (age of onset: 24) in her father; Cancer in her sister; Diabetes in her maternal grandmother and sister; Emphysema in her father, maternal grandfather, and mother; Esophageal varices in her father; Heart attack (age of onset: 29) in her mother; Heart defect in her sister and sister; Heart disease in her maternal grandmother and mother; Lung cancer in her maternal grandfather; Obesity in her son. There is no history of Colon cancer or Esophageal cancer.   Allergies Allergies  Allergen Reactions  . Gabapentin Anaphylaxis  . Lyrica [Pregabalin] Shortness Of Breath    Trouble breathing  . Ketorolac Tromethamine Hives  . Lisinopril Cough     Home Medications  Prior to Admission medications   Medication Sig Start Date End Date Taking? Authorizing Provider   albuterol (PROAIR HFA) 108 (90 Base) MCG/ACT inhaler Inhale 1-2 puffs into the lungs every 6 (six) hours as needed for wheezing or shortness of breath. 01/20/18  Yes Tanda Rockers, MD  budesonide-formoterol (SYMBICORT) 160-4.5 MCG/ACT inhaler Inhale 2 puffs into the lungs 2 (two) times daily. 06/29/18  Yes Tanda Rockers, MD  clotrimazole-betamethasone (LOTRISONE) cream Apply 1 application topically 2 (two) times daily as needed. Patient taking differently: Apply 1 application topically 2 (two) times daily as needed (yeast).  05/02/18  Yes [name], PA-C  dextromethorphan-guaiFENesin [facility] DM) 30-600 MG 12hr tablet Take 1 tablet by mouth 2 (two) times daily as needed for cough. Patient taking differently: Take 1 tablet by mouth 2 (two) times daily.  06/15/18  Yes Hosie Poisson, MD  diltiazem (CARDIZEM CD) 180 MG 24 hr capsule Take 1 capsule (180 mg total) by mouth daily. 06/02/18  Yes [name], PA-C  glucose blood (ACCU-CHEK AVIVA) test strip Check blood sugars 4 times per day for diabetes. Dx:E11.29 07/24/18  Yes [name], PA-C  insulin glargine (LANTUS) 100 UNIT/ML injection Inject 0.15 mLs (15 Units total) into the skin daily. Patient taking differently: Inject 31 Units into the skin every morning.  07/17/18  Yes Shah, Pratik D, DO  insulin lispro (HUMALOG) 100 UNIT/ML injection Inject 0.15 mLs (15 Units total) into the skin 3 (three) times daily as needed for high blood sugar. Uses sliding scale. Sugar over 200 will give insulin. 07/14/18  Yes [name], PA-C  ipratropium-albuterol (DUONEB) 0.5-2.5 (3) MG/3ML SOLN Take 3 mLs by nebulization 4 (four) times daily. Dx: J44.9 Patient taking differently: Take 3 mLs by nebulization every 6 (six) hours as needed (shortness of breath).  03/21/18  Yes Parrett, [name] S, NP  levothyroxine (SYNTHROID, LEVOTHROID) 88 MCG tablet TAKE ONE TABLET BY MOUTH DAILY Patient taking differently: Take 88 mcg by mouth every evening.  07/20/18   Yes [name], PA-C  morphine (MS CONTIN) 30 MG 12 hr tablet Take 30 mg by mouth every 12 (twelve) hours. 03/23/18  Yes [provider]  Multiple Vitamins-Minerals (CENTRUM SILVER PO) Take by mouth.   Yes [provider]  nortriptyline (PAMELOR) 25 MG capsule Take 1 capsule (25 mg total) by mouth at bedtime. 06/21/18  Yes [name], PA-C  nystatin (MYCOSTATIN/NYSTOP) powder APPLY TOPICALLY TWICE DAILY AS NEEDED FOR YEAST INFECTION Patient taking differently: Apply 1 g topically 2 (two) times daily as needed (yeast).  09/27/17  Yes [name], PA-C  OXYGEN Inhale 3.5 L into the lungs continuous. O2 3.5L at home.   Yes [provider]  pravastatin (PRAVACHOL) 20 MG tablet TAKE ONE TABLET BY MOUTH EVERY DAY Patient taking differently: Take 20 mg by mouth every evening.  05/05/18  Yes [name], PA-C  Probiotic Product (PROBIOTIC PO) Take by mouth.   Yes [provider]  torsemide (DEMADEX) 20 MG tablet Take 1 tablet (20 mg total) by mouth daily for 30 days. 07/17/18 08/28/18 Yes Shah, Pratik D, DO  potassium chloride (K-DUR) 10 MEQ tablet Take 1 tablet (10 mEq total) by mouth daily for 30 days. Patient not taking: Reported on 08/28/2018 07/17/18 08/28/18  Heath Lark D, DO     Critical care time: 86 minutes independent of procedure time.     Chesley Mires, MD [facility] Pulmonary/Critical Care 08/30/2018, 7:55 PM

## 2018-08-30 NOTE — Progress Notes (Addendum)
PROGRESS NOTE    Renee Noble  XIP:[id number] DOB: 05-09-1956 DOA: 08/28/2018 PCP: Delorse Limber   Brief Narrative:  63 year old female with history of chronic hypoxic respiratory failure on 3 L nasal cannula at home, severe COPD, chronic diastolic heart failure, diabetes mellitus type 2, obesity, [facility] with acute on chronic respiratory failure in the setting of acute on chronic diastolic heart failure and COPD exacerbation.  Patient had 3 to 4 days of progressive shortness of breath and orthopnea along with 3 to 5 pound weight gain in 1 week.  She ported that her cousin who lives with her was diagnosed with pneumonia within the last week and is also being tested for COVID 19, negative results.  She denied any known or suspected COVID-19 contacts or recent traveling.  Patient was transferred to [facility] for COVID rule out.  Chest x-ray on admission shows bilateral interstitial/airspace opacities concerning with edema.  COVID 19 was negative.  Case discussed with Dr. Johnnye Sima, recommending given her cousin, to retest COVID-19 and send it to another lab.  Assessment & Plan   Acute on chronic hypoxemic and hypercapnic respiratory failure secondary to COPD exacerbation -Thought to be secondary to COVID-19, although testing thus far has been negative -Troponin 0 0.03, d-dimer elevated 2.13 (of note was also elevated several months ago at 2.04) -Lactic acid, procalcitonin, fibrinogen, ferritin, LDH, CRP within normal range -IL-6 pending -Patient was weaned off of BiPAP, and currently on nasal cannula- Currently on 6L  -of note, patient on 3L of O2 at home at baseline -Continue breathing treatments with steroids -ID recommending repeating COVID 19 testing given that patient is high risk.   PPE worn: N95 with surgical mask, face shield, gown and gloves, hair Bonnett, shoe covers  Acute on chronic diastolic heart failure likely secondary to  [place] atrial fibrillation with RVR -Patient likely developed a cardiac arrhythmia due to respiratory symptoms which have also caused some heart failure however overtly she does not appear to be volume overloaded -Patient has been using torsemide at home -Continue to monitor intake and output, daily weights -Patient was given IV Lasix -She was started on Eliquis as CHADSVASC 4 -Currently on Cardizem drip, will try to wean off and restart oral medications -Of note, last echocardiogram 04/08/2018 shows an EF of 60 to 65%, study was not technically sufficient to allow evaluation of LV diastolic function  Acute kidney injury on chronic kidney disease, stage III -Likely secondary to diuretics -Creatinine worsen, currently 2.42.  Baseline creatinine approximately 1.1-1.3 -Will place patient on gentle IV fluid hydration and monitor BMP  Diabetes mellitus, type II -continue lantus and ISS with CBG monitoring  Hypothyroidism -Continue Synthroid  Chronic pain syndrome -Home medication, MS Contin   DVT Prophylaxis  Eliquis  Code Status: Full  Family Communication: None at bedside  Disposition Plan: Admitted. Continue to monitor in Stepdown. Pending repeat COVID test.  Consultants None  Procedures  None  Antibiotics   Anti-infectives (From admission, onward)   None      Subjective:   [name] seen and examined today.  Patient feels that her breathing is mildly improved however not back to baseline.  She denies any coughing spells at this time.  Does state that she takes Mucinex twice at home however is not been getting any during the hospitalization.  Denies current chest pain, abdominal pain, nausea or vomiting, diarrhea or constipation, dizziness or headache.  Does state that she  feels very weak and unable to move.  Objective:   Vitals:   08/30/18 0700 08/30/18 0800 08/30/18 0820 08/30/18 0900  BP: 127/66  100/78 (!) 119/48  Pulse: 69 92 92 100  Resp: 15 (!) 21 (!)  21 18  Temp:      TempSrc:      SpO2: 98% 95% (!) 89% 90%  Weight:      Height:        Intake/Output Summary (Last 24 hours) at 08/30/2018 1015 Last data filed at 08/30/2018 0900 Gross per 24 hour  Intake 945.42 ml  Output 300 ml  Net 645.42 ml   Filed Weights   08/28/18 2226 08/29/18 0500 08/30/18 0300  Weight: 130.8 kg 130.8 kg (!) 138.1 kg    Exam  General: Well developed, well nourished, NAD, appears stated age  76: NCAT, mucous membranes moist.   Neck: Supple  Cardiovascular: S1 S2 auscultated, irregularly irregular   Respiratory: Diminished breath sounds (difficult to auscultate given body habitus and inability to sit up or roll to the side)   Abdomen: Soft, nontender, nondistended, + bowel sounds  Extremities: warm dry without cyanosis clubbing or edema  Neuro: AAOx3, nonfocal  Psych: Appropriate mood and affect   Data Reviewed: I have personally reviewed following labs and imaging studies  CBC: Recent Labs  Lab 08/28/18 1824 08/29/18 0412 08/30/18 0320  WBC 5.7 3.4* 7.3  NEUTROABS 5.4 3.1  --   HGB 9.2* 9.6* 10.0*  HCT 34.2* 35.3* 36.6  MCV 88.1 88.9 88.2  PLT 66* 66* 174*   Basic Metabolic Panel: Recent Labs  Lab 08/28/18 1824 08/29/18 0412 08/30/18 0320  NA 139 139 134*  K 4.5 4.7 5.2*  CL 89* 88* 85*  CO2 39* 38* 38*  GLUCOSE 153* 171* 207*  BUN 47* 52* 65*  CREATININE 1.63* 1.77* 2.42*  CALCIUM 8.5* 8.4* 8.1*  MG 2.5* 2.3  --   PHOS  --  7.0*  --    GFR: Estimated Creatinine Clearance: 32.2 mL/min (A) (by C-G formula based on SCr of 2.42 mg/dL (H)). Liver Function Tests: Recent Labs  Lab 08/28/18 1824 08/29/18 0412 08/30/18 0320  AST 16 16 17   ALT 10 10 12   ALKPHOS 62 61 60  BILITOT 1.0 1.0 0.5  PROT 7.0 6.9 7.3  ALBUMIN 4.0 3.8 4.0   No results for input(s): LIPASE, AMYLASE in the last 168 hours. No results for input(s): AMMONIA in the last 168 hours. Coagulation Profile: No results for input(s): INR, PROTIME in  the last 168 hours. Cardiac Enzymes: Recent Labs  Lab 08/28/18 1824  TROPONINI 0.03*   BNP (last 3 results) No results for input(s): PROBNP in the last 8760 hours. HbA1C: No results for input(s): HGBA1C in the last 72 hours. CBG: Recent Labs  Lab 08/29/18 0804 08/29/18 1132 08/29/18 1611 08/29/18 2105 08/30/18 0801  GLUCAP 155* 206* 176* 208* 197*   Lipid Profile: No results for input(s): CHOL, HDL, LDLCALC, TRIG, CHOLHDL, LDLDIRECT in the last 72 hours. Thyroid Function Tests: Recent Labs    08/29/18 0412  TSH 1.372   Anemia Panel: Recent Labs    08/29/18 1055  FERRITIN 36   Urine analysis:    Component Value Date/Time   COLORURINE YELLOW 08/29/2018 0950   APPEARANCEUR CLEAR 08/29/2018 0950   LABSPEC 1.014 08/29/2018 0950   PHURINE 5.0 08/29/2018 0950   GLUCOSEU NEGATIVE 08/29/2018 0950   GLUCOSEU NEGATIVE 08/17/2016 1340   HGBUR SMALL (A) 08/29/2018 0950   BILIRUBINUR NEGATIVE  08/29/2018 0950   BILIRUBINUR neg 01/15/2015 [place] 08/29/2018 0950   PROTEINUR 30 (A) 08/29/2018 0950   UROBILINOGEN 1.0 08/17/2016 1340   NITRITE NEGATIVE 08/29/2018 0950   LEUKOCYTESUR NEGATIVE 08/29/2018 0950   Sepsis Labs: @LABRCNTIP (procalcitonin:4,lacticidven:4)  ) Recent Results (from the past 240 hour(s))  MRSA PCR Screening     Status: None   Collection Time: 08/28/18  9:58 PM  Result Value Ref Range Status   MRSA by PCR NEGATIVE NEGATIVE Final    Comment:        The GeneXpert MRSA Assay (FDA approved for NASAL specimens only), is one component of a comprehensive MRSA colonization surveillance program. It is not intended to diagnose MRSA infection nor to guide or monitor treatment for MRSA infections. Performed at [facility], [address], [place], [place] [address]   Respiratory Panel by PCR     Status: None   Collection Time: 08/29/18  7:27 AM  Result Value Ref Range Status   Adenovirus NOT DETECTED NOT DETECTED Final   Coronavirus  229E NOT DETECTED NOT DETECTED Final    Comment: (NOTE) The Coronavirus on the Respiratory Panel, DOES NOT test for the novel  Coronavirus (2019 nCoV)    Coronavirus HKU1 NOT DETECTED NOT DETECTED Final   Coronavirus NL63 NOT DETECTED NOT DETECTED Final   Coronavirus OC43 NOT DETECTED NOT DETECTED Final   Metapneumovirus NOT DETECTED NOT DETECTED Final   Rhinovirus / Enterovirus NOT DETECTED NOT DETECTED Final   Influenza A NOT DETECTED NOT DETECTED Final   Influenza B NOT DETECTED NOT DETECTED Final   Parainfluenza Virus 1 NOT DETECTED NOT DETECTED Final   Parainfluenza Virus 2 NOT DETECTED NOT DETECTED Final   Parainfluenza Virus 3 NOT DETECTED NOT DETECTED Final   Parainfluenza Virus 4 NOT DETECTED NOT DETECTED Final   Respiratory Syncytial Virus NOT DETECTED NOT DETECTED Final   Bordetella pertussis NOT DETECTED NOT DETECTED Final   Chlamydophila pneumoniae NOT DETECTED NOT DETECTED Final   Mycoplasma pneumoniae NOT DETECTED NOT DETECTED Final    Comment: Performed at [facility], [place] [address] [place], [place] [address]  SARS Coronavirus 2 [facility] order, Performed in [facility] hospital lab)     Status: None   Collection Time: 08/29/18  7:27 AM  Result Value Ref Range Status   SARS Coronavirus 2 NEGATIVE NEGATIVE Final    Comment: (NOTE) If result is NEGATIVE SARS-CoV-2 target nucleic acids are NOT DETECTED. The SARS-CoV-2 RNA is generally detectable in upper and lower  respiratory specimens during the acute phase of infection. The lowest  concentration of SARS-CoV-2 viral copies this assay can detect is 250  copies / mL. A negative result does not preclude SARS-CoV-2 infection  and should not be used as the sole basis for treatment or other  patient management decisions.  A negative result may occur with  improper specimen collection / handling, submission of specimen other  than nasopharyngeal swab, presence of viral mutation(s) within the  areas targeted by  this assay, and inadequate number of viral copies  (<250 copies / mL). A negative result must be combined with clinical  observations, patient history, and epidemiological information. If result is POSITIVE SARS-CoV-2 target nucleic acids are DETECTED. The SARS-CoV-2 RNA is generally detectable in upper and lower  respiratory specimens dur ing the acute phase of infection.  Positive  results are indicative of active infection with SARS-CoV-2.  Clinical  correlation with patient history and other diagnostic information is  necessary to determine patient infection status.  Positive results do  not rule out bacterial infection or co-infection with other viruses. If result is PRESUMPTIVE POSTIVE SARS-CoV-2 nucleic acids MAY BE PRESENT.   A presumptive positive result was obtained on the submitted specimen  and confirmed on repeat testing.  While 2019 novel coronavirus  (SARS-CoV-2) nucleic acids may be present in the submitted sample  additional confirmatory testing may be necessary for epidemiological  and / or clinical management purposes  to differentiate between  SARS-CoV-2 and other Sarbecovirus currently known to infect humans.  If clinically indicated additional testing with an alternate test  methodology [phone]) is advised. The SARS-CoV-2 RNA is generally  detectable in upper and lower respiratory sp ecimens during the acute  phase of infection. The expected result is Negative. Fact Sheet for Patients:  StrictlyIdeas.no Fact Sheet for Healthcare Providers: BankingDealers.co.za This test is not yet approved or cleared by the [place] FDA and has been authorized for detection and/or diagnosis of SARS-CoV-2 by FDA under an Emergency Use Authorization (EUA).  This EUA will remain in effect (meaning this test can be used) for the duration of the COVID-19 declaration under Section 564(b)(1) of the Act, 21 U.S.C. section  360bbb-3(b)(1), unless the authorization is terminated or revoked sooner. Performed at [facility], [place] [address] [place], [place] [address]       Radiology Studies: Dg Chest Port 1 View  Result Date: 08/30/2018 CLINICAL DATA:  COPD. EXAM: PORTABLE CHEST 1 VIEW COMPARISON:  August 28, 2018 FINDINGS: The focal infiltrate in the right base has improved. Left retrocardiac opacity remains. No other changes. IMPRESSION: Improving focal opacity in the right base. Stable to mildly improved left retrocardiac opacity. Electronically Signed   By: Dorise Bullion III M.D   On: 08/30/2018 09:19   Dg Chest Portable 1 View  Result Date: 08/28/2018 CLINICAL DATA:  63 year old female with a history of shortness of breath EXAM: PORTABLE CHEST 1 VIEW COMPARISON:  07/14/2018, 06/08/2018 FINDINGS: Cardiomediastinal silhouette unchanged in size and contour with cardiomegaly. Mixed interstitial and airspace opacities of the bilateral lungs, worse than the comparison. Retrocardiac opacity with obscuration of the left hemidiaphragm and blunting of left costophrenic angle. Partial obscuration of the right hemidiaphragm with blunting of the costophrenic angle. IMPRESSION: Mixed interstitial and airspace opacities of the bilateral lungs, worse than the comparison, with the differential including multifocal infection and/or edema. Likely bibasilar pleural effusions and associated atelectasis/consolidation. Cardiomegaly Electronically Signed   By: Corrie Mckusick D.O.   On: 08/28/2018 18:38     Scheduled Meds: . apixaban  5 mg Oral BID  . insulin aspart  0-15 Units Subcutaneous TID WC  . insulin glargine  20 Units Subcutaneous Q breakfast  . levothyroxine  88 mcg Oral Q0600  . mouth rinse  15 mL Mouth Rinse BID  . methylPREDNISolone (SOLU-MEDROL) injection  40 mg Intravenous Q6H  . morphine  30 mg Oral Q12H  . nystatin   Topical TID  . pravastatin  20 mg Oral QPM   Continuous Infusions: . diltiazem  (CARDIZEM) infusion 7.5 mg/hr (08/30/18 0825)     LOS: 2 days   Time Spent in minutes   45 minutes  Hae Ahlers D.O. on 08/30/2018 at 10:15 AM  Between 7am to 7pm - Please see pager noted on [email or web address]  After 7pm go to [email or web address]  And look for the night coverage person covering for me after hours  [facility] Office  [phone]

## 2018-08-30 NOTE — Procedures (Signed)
Central Venous Catheter Insertion Procedure Note Renee Noble [id number] 04-Feb-1956  Procedure: Insertion of Central Venous Catheter Indications: Drug and/or fluid administration  Procedure Details Consent: Unable to obtain consent because of emergent medical necessity. Time Out: Verified patient identification, verified procedure, site/side was marked, verified correct patient position, special equipment/implants available, medications/allergies/relevent history reviewed, required imaging and test results available.  Performed  Maximum sterile technique was used including antiseptics, cap, gloves, gown, hand hygiene, mask and sheet. Skin prep: Chlorhexidine; local anesthetic administered A antimicrobial bonded/coated triple lumen catheter was placed in the left subclavian vein using the Seldinger technique.  Attempted Lt IJ using ultrasound.  Got vessel, but unable to pass guidewire.  Changed to Lt [place] and placed w/o difficulty.  Evaluation Blood flow good Complications: No apparent complications Patient did tolerate procedure well. Chest X-ray ordered to verify placement.  CXR: pending.  Pascal Stiggers 08/30/2018, 7:35 PM

## 2018-08-31 DIAGNOSIS — I509 Heart failure, unspecified: Secondary | ICD-10-CM

## 2018-08-31 DIAGNOSIS — J441 Chronic obstructive pulmonary disease with (acute) exacerbation: Secondary | ICD-10-CM

## 2018-08-31 LAB — BASIC METABOLIC PANEL
Anion gap: 12 (ref 5–15)
Anion gap: 13 (ref 5–15)
BUN: 80 mg/dL — ABNORMAL HIGH (ref 8–23)
BUN: 93 mg/dL — ABNORMAL HIGH (ref 8–23)
CO2: 34 mmol/L — ABNORMAL HIGH (ref 22–32)
CO2: 34 mmol/L — ABNORMAL HIGH (ref 22–32)
Calcium: 7.4 mg/dL — ABNORMAL LOW (ref 8.9–10.3)
Calcium: 7.5 mg/dL — ABNORMAL LOW (ref 8.9–10.3)
Chloride: 88 mmol/L — ABNORMAL LOW (ref 98–111)
Chloride: 89 mmol/L — ABNORMAL LOW (ref 98–111)
Creatinine, Ser: 2.82 mg/dL — ABNORMAL HIGH (ref 0.44–1.00)
Creatinine, Ser: 2.8 mg/dL — ABNORMAL HIGH (ref 0.44–1.00)
GFR calc Af Amer: 20 mL/min — ABNORMAL LOW (ref >60)
GFR calc Af Amer: 20 mL/min — ABNORMAL LOW (ref >60)
GFR calc non Af Amer: 17 mL/min — ABNORMAL LOW (ref >60)
GFR calc non Af Amer: 17 mL/min — ABNORMAL LOW (ref >60)
Glucose, Bld: 163 mg/dL — ABNORMAL HIGH (ref 70–99)
Glucose, Bld: 210 mg/dL — ABNORMAL HIGH (ref 70–99)
Potassium: 5.3 mmol/L — ABNORMAL HIGH (ref 3.5–5.1)
Potassium: 4.9 mmol/L (ref 3.5–5.1)
Sodium: 134 mmol/L — ABNORMAL LOW (ref 135–145)
Sodium: 136 mmol/L (ref 135–145)

## 2018-08-31 LAB — CBC
HCT: 30.7 % — ABNORMAL LOW (ref 36.0–46.0)
Hemoglobin: 8.8 g/dL — ABNORMAL LOW (ref 12.0–15.0)
MCH: 24.3 pg — ABNORMAL LOW (ref 26.0–34.0)
MCHC: 28.7 g/dL — ABNORMAL LOW (ref 30.0–36.0)
MCV: 84.8 fL (ref 80.0–100.0)
Platelets: 86 10*3/uL — ABNORMAL LOW (ref 150–400)
RBC: 3.62 MIL/uL — ABNORMAL LOW (ref 3.87–5.11)
RDW: 17.1 % — ABNORMAL HIGH (ref 11.5–15.5)
WBC: 6.2 10*3/uL (ref 4.0–10.5)
nRBC: 0 % (ref 0.0–0.2)

## 2018-08-31 LAB — PHOSPHORUS
Phosphorus: 5.8 mg/dL — ABNORMAL HIGH (ref 2.5–4.6)
Phosphorus: 6.1 mg/dL — ABNORMAL HIGH (ref 2.5–4.6)

## 2018-08-31 LAB — NOVEL CORONAVIRUS, NAA (HOSP ORDER, SEND-OUT TO REF LAB; TAT 18-24 HRS): SARS-CoV-2, NAA: NOT DETECTED

## 2018-08-31 LAB — GLUCOSE, CAPILLARY
Glucose-Capillary: 151 mg/dL — ABNORMAL HIGH (ref 70–99)
Glucose-Capillary: 194 mg/dL — ABNORMAL HIGH (ref 70–99)
Glucose-Capillary: 199 mg/dL — ABNORMAL HIGH (ref 70–99)
Glucose-Capillary: 204 mg/dL — ABNORMAL HIGH (ref 70–99)
Glucose-Capillary: 211 mg/dL — ABNORMAL HIGH (ref 70–99)
Glucose-Capillary: 224 mg/dL — ABNORMAL HIGH (ref 70–99)

## 2018-08-31 LAB — LEGIONELLA PNEUMOPHILA SEROGP 1 UR AG: L. pneumophila Serogp 1 Ur Ag: NEGATIVE

## 2018-08-31 LAB — INTERLEUKIN-6, PLASMA: Interleukin-6, Plasma: 6.6 pg/mL (ref 0.0–12.2)

## 2018-08-31 LAB — MAGNESIUM
Magnesium: 2.2 mg/dL (ref 1.7–2.4)
Magnesium: 2.1 mg/dL (ref 1.7–2.4)

## 2018-08-31 LAB — PROCALCITONIN: Procalcitonin: <0.1 ng/mL

## 2018-08-31 MED ORDER — INSULIN GLARGINE 100 UNIT/ML ~~LOC~~ SOLN
15.0000 [IU] | Freq: Two times a day (BID) | SUBCUTANEOUS | Status: DC
  Administered 2018-08-31 – 2018-09-06 (×12): 15 [IU] via SUBCUTANEOUS
  Filled 2018-08-31 (×13): qty 0.15

## 2018-08-31 MED ORDER — SODIUM POLYSTYRENE SULFONATE 15 GM/60ML PO SUSP
30.0000 g | Freq: Once | ORAL | Status: AC
  Administered 2018-08-31: 03:00:00 30 g
  Filled 2018-08-31: qty 120

## 2018-08-31 MED ORDER — PRO-STAT SUGAR FREE PO LIQD
60.0000 mL | Freq: Three times a day (TID) | ORAL | Status: DC
  Administered 2018-08-31 – 2018-09-01 (×3): 60 mL
  Filled 2018-08-31 (×3): qty 60

## 2018-08-31 MED ORDER — METHYLPREDNISOLONE SODIUM SUCC 40 MG IJ SOLR
40.0000 mg | Freq: Two times a day (BID) | INTRAMUSCULAR | Status: DC
  Administered 2018-08-31 – 2018-09-02 (×5): 40 mg via INTRAVENOUS
  Filled 2018-08-31 (×7): qty 1

## 2018-08-31 MED ORDER — PHENYLEPHRINE HCL-NACL 10-0.9 MG/250ML-% IV SOLN
0.0000 ug/min | INTRAVENOUS | Status: DC
  Administered 2018-08-31: 15 ug/min via INTRAVENOUS
  Administered 2018-08-31: 02:00:00 20 ug/min via INTRAVENOUS
  Administered 2018-09-01: 9 ug/min via INTRAVENOUS
  Filled 2018-08-31: qty 250
  Filled 2018-08-31: qty 500

## 2018-08-31 MED ORDER — NEPRO/CARBSTEADY PO LIQD
1000.0000 mL | ORAL | Status: DC
  Administered 2018-08-31: 1000 mL
  Filled 2018-08-31 (×2): qty 1000

## 2018-08-31 NOTE — Progress Notes (Signed)
Dr. Oletta Darter notified of low BP with systolic in the 12'A and 44'L. Instructed to run a 1000 cc bolus of NS and obtain a CVP. Bolus started. Pressure now reading 93/49 with a MAP of 62. Unable to obtain accurate CVP despite multiple calibrations. CVP reading 23-26. Charge nurse on rapid response at this time. Will re-attempt with assistance.

## 2018-08-31 NOTE — Progress Notes (Signed)
NAME:  Renee Noble, MRN:  [id number], DOB:  1955-07-25, LOS: 3 ADMISSION DATE:  08/28/2018, CONSULTATION DATE:  08/29/2018 REFERRING MD:  Dr. Ree Kida, [facility], CHIEF COMPLAINT:  Short of breath   History   63 yo female former smoker presented to [facility] 4/13 with progressive dyspnea x 4 days. #l at home for COPD.  She lives with her cousin who was dx with PNA (reported COVID test negative).  CXR showed changes of pneumonia.  Initial COVID test was negative, but repeat result pending.  Developed progressive hypercapnia with altered mental status and required intubation 4/15.  Past Medical History  Severe COPD, chronic respiratory failure on 3 liters oxygen, diastolic CHF, DM II, Hypothyroidism, OSA, Depression, Chronic pain.   Significant Hospital Events   4/13 Admit 4/15 VDRF  Consults:    Procedures:  ETT 4/15 >> Lt [place] CVL 4/15 >>  COVID Testing:  Procalcitonin 4/14 >> less than 0.10 D dimer 4/14 >> 2.13 CRP 4/14 >> less than 0.8 LDH 4/14 >> 107 Ferritin 4/14 >> 26 Fibrinogen 4/14 >> 330 IL-6 4/15 >>   Micro Data:  RVP 4/14 >> negative COVID 4/14 >> negative Pneumococcal Ag 4/14 >> negative Legionella Ag 4/14 >>  COVID 4/15 >>  Tracheal aspirate 4/15 >  Antimicrobials:  Rocephin 4/15 >> Zithromax 4/15 >>   Interim history/subjective:  Remains on vent: Hypotensive overnight requiring phenylephrine. Minimal vent settings tolerating well.   Objective   Blood pressure (!) 136/113, pulse (!) 110, temperature 97.7 F (36.5 C), resp. rate (!) 22, height 5\' 4"  (1.626 m), weight 135.2 kg, SpO2 99 %. CVP:  [21 mmHg-28 mmHg] 28 mmHg  Vent Mode: PRVC FiO2 (%):  [40 %-100 %] 40 % Set Rate:  [18 bmp-20 bmp] 20 bmp Vt Set:  [430 mL] 430 mL PEEP:  [5 cmH20-8 cmH20] 8 cmH20 Plateau Pressure:  [28 cmH20-35 cmH20] 34 cmH20   Intake/Output Summary (Last 24 hours) at 08/31/2018 0919 Last data filed at 08/31/2018 0800 Gross per 24 hour  Intake 3017.43 ml  Output 610 ml  Net 2407.43  ml   Filed Weights   08/29/18 0500 08/30/18 0300 08/31/18 0320  Weight: 130.8 kg (!) 138.1 kg 135.2 kg    Examination: General:  Morbidly obese female in NAD on vent Neuro:  RASS -2. Opens eyes to voice.  HEENT:  [place]/AT, No JVD noted, PERRL Cardiovascular:  Regular, Aflutter on monitor, no MRG Lungs:  Clear bilateral breath sounds. No wheeze Abdomen:  Soft, non-distended, non-tender Musculoskeletal:  No acute deformity. Lower extremity edea and appearance of venous stasis.  Skin:  Intact, MMM   Resolved Hospital Problem list     Assessment & Plan:   Acute on chronic hypoxic, hypercapnic respiratory failure Plan - Full vent support - Plan for SBT tomorrow, hopeful for extubation - ABG in AM - Follow CXR  COPD with acute exacerbation - Scheduled nebs - Solumedrol > decreased to 40mg  BID  CAP. Discussion: Initial COVID negative, but still concern for COVID due to home contact also having pneumonia. Plan - airborne and contact precautions - COVID from 4/15 pending - tracheal aspirate for culture pending - add rocephin, zithromax. Low threshold to DC - Check PCT in AM  Acute metabolic encephalopathy. Hx of chronic pain, depression. Plan - precedex, fentanyl gtt with prn versed for RASS goal 0 to -1 - hold outpt pamelor, MS contin - WUA with SBT in AM  A fib with RVR. Hx of HTN with chronic diastolic CHF, HLD.  Plan - cardizem, eliquis, pravachol per primary  AKI from prerenal azotemia >> baseline creatinine 1.17 from 07/31/18. She is s/p L nephrectopmy Acute urine retention Plan - per primary  DM type II with steroid induced hyperglycemia. Hx of hypothyroidism. Plan - SSI/Lantus per primary   Best practice:  Diet: tube feeds DVT prophylaxis: eliquis GI prophylaxis: protonix Mobility: bed rest Code Status: full code Family Communication: Caretaker and roommate [name] updated. [name]'s husband hospitalized here as well for PNA. Patient has a son, but  they don't talk. [name] tells me she is updating the son. Does not want to give me his contact. Lisbeth Renshaw. Disposition: ICU  Labs   CBC: Recent Labs  Lab 08/28/18 1824 08/29/18 0412 08/30/18 0320 08/31/18 0330  WBC 5.7 3.4* 7.3 6.2  NEUTROABS 5.4 3.1  --   --   HGB 9.2* 9.6* 10.0* 8.8*  HCT 34.2* 35.3* 36.6 30.7*  MCV 88.1 88.9 88.2 84.8  PLT 66* 66* 104* 86*    Basic Metabolic Panel: Recent Labs  Lab 08/28/18 1824 08/29/18 0412 08/30/18 0320 08/30/18 2018 08/31/18 0330  NA 139 139 134* 134* 134*  K 4.5 4.7 5.2* 5.4* 5.3*  CL 89* 88* 85* 88* 88*  CO2 39* 38* 38* 35* 34*  GLUCOSE 153* 171* 207* 176* 163*  BUN 47* 52* 65* 73* 80*  CREATININE 1.63* 1.77* 2.42* 2.73* 2.82*  CALCIUM 8.5* 8.4* 8.1* 7.2* 7.4*  MG 2.5* 2.3  --  2.2 2.2  PHOS  --  7.0*  --  8.0* 5.8*   GFR: Estimated Creatinine Clearance: 27.3 mL/min (A) (by C-G formula based on SCr of 2.82 mg/dL (H)). Recent Labs  Lab 08/28/18 1824 08/29/18 0412 08/30/18 0320 08/31/18 0330  PROCALCITON  --  <0.10 <0.10 <0.10  WBC 5.7 3.4* 7.3 6.2  LATICACIDVEN  --   --  1.4  --     Liver Function Tests: Recent Labs  Lab 08/28/18 1824 08/29/18 0412 08/30/18 0320  AST 16 16 17   ALT 10 10 12   ALKPHOS 62 61 60  BILITOT 1.0 1.0 0.5  PROT 7.0 6.9 7.3  ALBUMIN 4.0 3.8 4.0   No results for input(s): LIPASE, AMYLASE in the last 168 hours. No results for input(s): AMMONIA in the last 168 hours.  ABG    Component Value Date/Time   PHART 7.329 (L) 08/30/2018 2319   PCO2ART 71.2 (HH) 08/30/2018 2319   PO2ART 120 (H) 08/30/2018 2319   HCO3 36.5 (H) 08/30/2018 2319   TCO2 36.8 07/15/2015 0425   O2SAT 98.7 08/30/2018 2319     Coagulation Profile: No results for input(s): INR, PROTIME in the last 168 hours.  Cardiac Enzymes: Recent Labs  Lab 08/28/18 1824  TROPONINI 0.03*    HbA1C: Hgb A1c MFr Bld  Date/Time Value Ref Range Status  06/12/2018 04:10 AM 5.6 4.8 - 5.6 % Final    Comment:    (NOTE)  Pre diabetes:          5.7%-6.4% Diabetes:              >6.4% Glycemic control for   <7.0% adults with diabetes   04/09/2018 05:39 AM 5.2 4.8 - 5.6 % Final    Comment:    (NOTE) Pre diabetes:          5.7%-6.4% Diabetes:              >6.4% Glycemic control for   <7.0% adults with diabetes     CBG: Recent Labs  Lab  08/30/18 1132 08/30/18 1549 08/30/18 2023 08/30/18 2324 08/31/18 0315  GLUCAP 250* 322* 166* 173* 151*      Critical care time: 45 mins     Georgann Housekeeper, AGACNP-BC [place] Pager [phone] or [phone]  08/31/2018 9:24 AM

## 2018-08-31 NOTE — Progress Notes (Signed)
eLink Physician-Brief Progress Note Patient Name: Renee Noble DOB: March 28, 1956 MRN: [id number]   Date of Service  08/31/2018  HPI/Events of Note  CVP = 24.   eICU Interventions  Will D/C IV fluid.      Intervention Category Major Interventions: Other:  [name] 08/31/2018, 3:03 AM

## 2018-08-31 NOTE — Progress Notes (Signed)
[facility] Progress Note Patient Name: Renee Noble DOB: 1956/03/25 MRN: [id number]   Date of Service  08/31/2018  HPI/Events of Note  Hyperkalemia - K+ = 5.4 .  eICU Interventions  Will order: 1. Kayexalate 30 gm per tube now.      Intervention Category Major Interventions: Electrolyte abnormality - evaluation and management  Ihsan Nomura [place] 08/31/2018, 2:14 AM

## 2018-08-31 NOTE — Progress Notes (Signed)
Pt agitated, restless, following commands, mouths that she feels "shaky, restless."  Pt repositioned, reassured, fentanyl bolus and increase in precedex to help calm patient.

## 2018-08-31 NOTE — Progress Notes (Addendum)
[facility] Progress Note Patient Name: Renee Noble DOB: 03/18/1956 MRN: [id number]   Date of Service  08/31/2018  HPI/Events of Note  Hypotension - BP = 91/46 with MAP = 58. CVP = 20. History of AFLUTTER.  eICU Interventions  Will order: 1. Phenylephrine IV infusion. Titrate to MAP > 65.      Intervention Category Major Interventions: Hypotension - evaluation and management  Keyshun Elpers [place] 08/31/2018, 1:49 AM

## 2018-08-31 NOTE — Progress Notes (Addendum)
Initial Nutrition Assessment  RD working remotely.   DOCUMENTATION CODES:   Morbid obesity  INTERVENTION:  - will order Nepro @ 25 ml/hr with 60 ml prostat TID. - this regimen will provide 1680 kcal, 139 grams protein, and 436 ml free water.  - will closely monitor serum K and Phos. - free water flush, if desired, to be per MD/NP. - recommend 500 mg ascorbic acid BID and ocuvite once/day to aid in wound healing.   NUTRITION DIAGNOSIS:   Inadequate oral intake related to inability to eat as evidenced by NPO status.  GOAL:   Provide needs based on ASPEN/SCCM guidelines  MONITOR:   Vent status, TF tolerance, Labs, Weight trends, Skin  REASON FOR ASSESSMENT:   Ventilator, Consult Enteral/tube feeding initiation and management  ASSESSMENT:   63 y.o. female with medical history significant for chronic hypoxic respiratory failure on 3L O2, severe COPD, CHF, type 2 DM, morbid obesity, and chronic thrombocytopenia. She was admitted to [facility] on 4/13 with acute on chronic hypoxic respiratory failure in the setting of acute on CHF and acute COPD exacerbation. She had presented to [facility] d/t 3-4 days of worsening SOB, 3-5 lb weight gain x1 week, and a chronic non-productive cough. She noted associated expiratory wheezing over 2-3 days. She reported that her cousin, with whom she lives, was diagnosed with pneumonia within the last week, but has been tested twice for COVID-19, with negative results on both occasions.  Estimated nutrition needs based on admission weight (124.3 kg) as weight significantly up since that time and that weight is consistent with weight since January. Patient was transferred from [facility] to [facility]. She was subsequently intubated yesterday at ~7:30 PM. OGT was placed and was put to LIS until ~11:30 PM and then TF per protocol was initiated. This protocol (Vital High Protein @ 40 ml/hr with 30 ml prostat BID) provides 1160 kcal, 114 grams protein, and  802 ml free water.   Per Dr. Juanetta Gosling note yesterday: acute on chronic hypoxic, hypercapnic respiratory failure, CAP, COVID testing negative but still concern for COVID d/t home contact, afib with RVR, AKI from pre-renal azotemia, acute metabolic encephalopathy.   Patient is currently intubated on ventilator support MV: 9.8 L/min as of 8:04 AM Temp (24hrs), Avg:98.1 F (36.7 C), Min:97.3 F (36.3 C), Max:98.8 F (37.1 C) Propofol: none  Medications reviewed; sliding scale novolog, 20 units lantus/day, 88 mcg synthroid per OGT/day, 40 mg solu-medrol QID, 30 g kayexalate per OGT x1 dose 4/16. Labs reviewed; Na: 134 mmol/l, K: 5.3 mmol/l, Cl: 88 mmol/l, BUN: 80 mg/dl, creatinine: 2.82 mg/dl, Ca: 7.4 mg/dl, Phos: 5.8 mg/dl, GFR: 17 mg/dl.  Drips; precedex @ 0.4 mcg/kg/hr, fentanyl @ 150 mcg/hr, neo @ 20 mcg/min.     NUTRITION - FOCUSED PHYSICAL EXAM:  unable to complete at this time.  Diet Order:   Diet Order            Diet NPO time specified  Diet effective now              EDUCATION NEEDS:   Not appropriate for education at this time  Skin:  Skin Assessment: Skin Integrity Issues: Skin Integrity Issues:: Stage II Stage II: bilateral IT  Last BM:  4/12  Height:   Ht Readings from Last 1 Encounters:  08/28/18 5\' 4"  (1.626 m)    Weight:   Wt Readings from Last 1 Encounters:  08/31/18 135.2 kg    Ideal Body Weight:  54.54 kg  BMI:  Body mass index is 51.16 kg/m.  Estimated Nutritional Needs:   Kcal:  1367-1740 kcal  Protein:  >/= 136 grams  Fluid:  >/= 1.2 L/day     Jarome Matin, MS, RD, LDN, [facility] Inpatient Clinical Dietitian Pager # [phone] After hours/weekend pager # [phone]

## 2018-08-31 NOTE — Progress Notes (Signed)
Inpatient Diabetes Program Recommendations  AACE/ADA: New Consensus Statement on Inpatient Glycemic Control (2015)  Target Ranges:  Prepandial:   less than 140 mg/dL      Peak postprandial:   less than 180 mg/dL (1-2 hours)      Critically ill patients:  140 - 180 mg/dL   Lab Results  Component Value Date   GLUCAP 204 (H) 08/31/2018   HGBA1C 5.6 06/12/2018    Review of Glycemic Control  Diabetes history: DM2 Outpatient Diabetes medications: Lantus 31 units QD, Novolog 15 units tidwc Current orders for Inpatient glycemic control: Lantus 20 units QD, Novolog 3-6-9 units Q4H.  Good glycemic control prior to admission. On Solumedrol 40 mg Q12H Will need TF coverage.   Inpatient Diabetes Program Recommendations:     Add Novolog 3 units Q4H for TF coverage.  Will follow closely.  Thank you. Lorenda Peck, RD, LDN, CDE Inpatient Diabetes Coordinator [phone]

## 2018-08-31 NOTE — Progress Notes (Signed)
PROGRESS NOTE    Renee Noble  NTI:[id number] DOB: [birth year] DOA: 08/28/2018 PCP: Delorse Limber    Brief Narrative:  63 year old female with history of chronic hypoxic respiratory failure on 3 L nasal cannula at home, severe COPD, chronic diastolic heart failure, diabetes mellitus type 2, obesity, [facility] with acute on chronic respiratory failure in the setting of acute on chronic diastolic heart failure and COPD exacerbation.  Patient had 3 to 4 days of progressive shortness of breath and orthopnea along with 3 to 5 pound weight gain in 1 week.  She ported that her cousin who lives with her was diagnosed with pneumonia within the last week and is also being tested for COVID 19, negative results.  She denied any known or suspected COVID-19 contacts or recent traveling.  Patient was transferred to [facility] for COVID rule out.  Chest x-ray on admission shows bilateral interstitial/airspace opacities concerning with edema.  COVID 19 was negative.  Case discussed with Dr. Johnnye Sima, recommending given her cousin, COVID-19 test has been resubmitted. 08/30/2018 Patient became hypercapnic with increased work of breathing necessitating intubation and mechanical ventilation.  She had drop in blood pressure, received 1 L of fluid and then low-dose Neo-Synephrine overnight.   Assessment & Plan:   Principal Problem:   Acute on chronic respiratory failure with hypoxia (HCC) Active Problems:   Type II diabetes mellitus with renal manifestations (HCC)   Chronic pain syndrome   Acute on chronic diastolic CHF (congestive heart failure) (HCC)   Hypothyroidism   Acute exacerbation of chronic obstructive pulmonary disease (COPD) (HCC)   Atrial flutter with rapid ventricular response (HCC)   AKI (acute kidney injury) ([place])   Pressure ulcer  Acute on chronic hypoxemic and hypercapnic respiratory failure secondary to COPD exacerbation: Thought to be  secondary to COVID-19, ruled out.  Repeat test pending. Patient on aggressive bronchodilator therapy, IV steroids, inhalational steroids.  Intubated due to work of breathing and hypercapnia.  Continue on mechanical ventilation until clinical improvement.  Because of high oxygen requirement, keeping isolation precautions.  Acute on chronic diastolic heart failure, new onset atrial fibrillation with RVR: Recent echocardiogram with normal ejection fraction. Patient was treated with IV Lasix, blood pressure is low with elevated creatinine, discontinue Lasix. Patient was on Cardizem drip, changed to oral Cardizem now.  Her heart rate is well controlled now. Started on Eliquis.  Acute renal failure with history of chronic kidney disease stage III: Baseline creatinine about 1.1-1.17.  She had hypotensive episodes, rapid A. Fib. Diuretics discontinued. Received 1 L of fluid overnight. Continue to monitor intake and output, close monitoring of renal functions.  Hyperkalemia:  given a dose of Kayexalate . Repeat at 4 PM.  Type 2 diabetes: Fairly controlled on Lantus and sliding scale insulin.  Hypothyroidism: Clinically euthyroid on current regimen.  Continue.  Chronic pain syndrome:  On chronic opiates.   DVT prophylaxis: Eliquis Code Status: Full code Family Communication: None Disposition Plan: ICU   Consultants:   PCCM  Procedures:   ETT, 08/30/2018  Left subclavian central line, 08/30/2018    Antimicrobials:   Rocephin and azithromycin IV, 08/30/2018   Subjective: Patient was seen and examined at the bedside.  She remains on airborne and contact isolation.  Overnight events noted.  Patient had episodes of low blood pressure, was given 1 L IV fluid as well as started on Neo-Synephrine. Patient was on ventilator and sedated, however she was able to follow simple  commands.  Currently blood pressures remained stable.   Objective: Vitals:   08/31/18 0830 08/31/18 0900  08/31/18 0930 08/31/18 1000  BP: (!) 101/52 (!) 97/57 (!) 108/44 (!) 100/51  Pulse: 71 61 74 72  Resp: '20 20 20 20  '$ Temp: 98.1 F (36.7 C) 97.9 F (36.6 C) 97.9 F (36.6 C) 97.9 F (36.6 C)  TempSrc:      SpO2: 95% 95% 96% 96%  Weight:      Height:        Intake/Output Summary (Last 24 hours) at 08/31/2018 1024 Last data filed at 08/31/2018 1000 Gross per 24 hour  Intake 3422.96 ml  Output 610 ml  Net 2812.96 ml   Filed Weights   08/29/18 0500 08/30/18 0300 08/31/18 0320  Weight: 130.8 kg (!) 138.1 kg 135.2 kg    Examination:  General exam: Appears calm and comfortable on vent. Follows simple commands.  Respiratory system: Clear to auscultation. Respiratory effort normal. Cardiovascular system: S1 & S2 heard, RRR. No JVD, murmurs, rubs, gallops or clicks.  Chronic venous stasis changes with pigmentation both legs.  None pedal edema. Gastrointestinal system: Abdomen is nondistended, soft and nontender. No organomegaly or masses felt. Normal bowel sounds heard.obese and pendulous. Central nervous system: Alert but sleepy. No focal neurological deficits. Extremities: Symmetric 5 x 5 power. Skin: No rashes, lesions or ulcers Psychiatry: Judgement and insight appear normal. Mood & affect appropriate.     Data Reviewed: I have personally reviewed following labs and imaging studies  CBC: Recent Labs  Lab 08/28/18 1824 08/29/18 0412 08/30/18 0320 08/31/18 0330  WBC 5.7 3.4* 7.3 6.2  NEUTROABS 5.4 3.1  --   --   HGB 9.2* 9.6* 10.0* 8.8*  HCT 34.2* 35.3* 36.6 30.7*  MCV 88.1 88.9 88.2 84.8  PLT 66* 66* 104* 86*   Basic Metabolic Panel: Recent Labs  Lab 08/28/18 1824 08/29/18 0412 08/30/18 0320 08/30/18 2018 08/31/18 0330  NA 139 139 134* 134* 134*  K 4.5 4.7 5.2* 5.4* 5.3*  CL 89* 88* 85* 88* 88*  CO2 39* 38* 38* 35* 34*  GLUCOSE 153* 171* 207* 176* 163*  BUN 47* 52* 65* 73* 80*  CREATININE 1.63* 1.77* 2.42* 2.73* 2.82*  CALCIUM 8.5* 8.4* 8.1* 7.2* 7.4*  MG  2.5* 2.3  --  2.2 2.2  PHOS  --  7.0*  --  8.0* 5.8*   GFR: Estimated Creatinine Clearance: 27.3 mL/min (A) (by C-G formula based on SCr of 2.82 mg/dL (H)). Liver Function Tests: Recent Labs  Lab 08/28/18 1824 08/29/18 0412 08/30/18 0320  AST '16 16 17  '$ ALT '10 10 12  '$ ALKPHOS 62 61 60  BILITOT 1.0 1.0 0.5  PROT 7.0 6.9 7.3  ALBUMIN 4.0 3.8 4.0   No results for input(s): LIPASE, AMYLASE in the last 168 hours. No results for input(s): AMMONIA in the last 168 hours. Coagulation Profile: No results for input(s): INR, PROTIME in the last 168 hours. Cardiac Enzymes: Recent Labs  Lab 08/28/18 1824  TROPONINI 0.03*   BNP (last 3 results) No results for input(s): PROBNP in the last 8760 hours. HbA1C: No results for input(s): HGBA1C in the last 72 hours. CBG: Recent Labs  Lab 08/30/18 1132 08/30/18 1549 08/30/18 2023 08/30/18 2324 08/31/18 0315  GLUCAP 250* 322* 166* 173* 151*   Lipid Profile: No results for input(s): CHOL, HDL, LDLCALC, TRIG, CHOLHDL, LDLDIRECT in the last 72 hours. Thyroid Function Tests: Recent Labs    08/29/18 0412  TSH 1.372  Anemia Panel: Recent Labs    08/29/18 1055  FERRITIN 36   Sepsis Labs: Recent Labs  Lab 08/29/18 0412 08/30/18 0320 08/31/18 0330  PROCALCITON <0.10 <0.10 <0.10  LATICACIDVEN  --  1.4  --     Recent Results (from the past 240 hour(s))  MRSA PCR Screening     Status: None   Collection Time: 08/28/18  9:58 PM  Result Value Ref Range Status   MRSA by PCR NEGATIVE NEGATIVE Final    Comment:        The GeneXpert MRSA Assay (FDA approved for NASAL specimens only), is one component of a comprehensive MRSA colonization surveillance program. It is not intended to diagnose MRSA infection nor to guide or monitor treatment for MRSA infections. Performed at [facility], [address], [place], [place] [address]   Respiratory Panel by PCR     Status: None   Collection Time: 08/29/18  7:27 AM  Result Value Ref  Range Status   Adenovirus NOT DETECTED NOT DETECTED Final   Coronavirus 229E NOT DETECTED NOT DETECTED Final    Comment: (NOTE) The Coronavirus on the Respiratory Panel, DOES NOT test for the novel  Coronavirus (2019 nCoV)    Coronavirus HKU1 NOT DETECTED NOT DETECTED Final   Coronavirus NL63 NOT DETECTED NOT DETECTED Final   Coronavirus OC43 NOT DETECTED NOT DETECTED Final   Metapneumovirus NOT DETECTED NOT DETECTED Final   Rhinovirus / Enterovirus NOT DETECTED NOT DETECTED Final   Influenza A NOT DETECTED NOT DETECTED Final   Influenza B NOT DETECTED NOT DETECTED Final   Parainfluenza Virus 1 NOT DETECTED NOT DETECTED Final   Parainfluenza Virus 2 NOT DETECTED NOT DETECTED Final   Parainfluenza Virus 3 NOT DETECTED NOT DETECTED Final   Parainfluenza Virus 4 NOT DETECTED NOT DETECTED Final   Respiratory Syncytial Virus NOT DETECTED NOT DETECTED Final   Bordetella pertussis NOT DETECTED NOT DETECTED Final   Chlamydophila pneumoniae NOT DETECTED NOT DETECTED Final   Mycoplasma pneumoniae NOT DETECTED NOT DETECTED Final    Comment: Performed at [facility], [place] [address] [place],  [address]  SARS Coronavirus 2 [facility] order, Performed in [facility] hospital lab)     Status: None   Collection Time: 08/29/18  7:27 AM  Result Value Ref Range Status   SARS Coronavirus 2 NEGATIVE NEGATIVE Final    Comment: (NOTE) If result is NEGATIVE SARS-CoV-2 target nucleic acids are NOT DETECTED. The SARS-CoV-2 RNA is generally detectable in upper and lower  respiratory specimens during the acute phase of infection. The lowest  concentration of SARS-CoV-2 viral copies this assay can detect is 250  copies / mL. A negative result does not preclude SARS-CoV-2 infection  and should not be used as the sole basis for treatment or other  patient management decisions.  A negative result may occur with  improper specimen collection / handling, submission of specimen other  than  nasopharyngeal swab, presence of viral mutation(s) within the  areas targeted by this assay, and inadequate number of viral copies  (<250 copies / mL). A negative result must be combined with clinical  observations, patient history, and epidemiological information. If result is POSITIVE SARS-CoV-2 target nucleic acids are DETECTED. The SARS-CoV-2 RNA is generally detectable in upper and lower  respiratory specimens dur ing the acute phase of infection.  Positive  results are indicative of active infection with SARS-CoV-2.  Clinical  correlation with patient history and other diagnostic information is  necessary to determine  patient infection status.  Positive results do  not rule out bacterial infection or co-infection with other viruses. If result is PRESUMPTIVE POSTIVE SARS-CoV-2 nucleic acids MAY BE PRESENT.   A presumptive positive result was obtained on the submitted specimen  and confirmed on repeat testing.  While 2019 novel coronavirus  (SARS-CoV-2) nucleic acids may be present in the submitted sample  additional confirmatory testing may be necessary for epidemiological  and / or clinical management purposes  to differentiate between  SARS-CoV-2 and other Sarbecovirus currently known to infect humans.  If clinically indicated additional testing with an alternate test  methodology [phone]) is advised. The SARS-CoV-2 RNA is generally  detectable in upper and lower respiratory sp ecimens during the acute  phase of infection. The expected result is Negative. Fact Sheet for Patients:  StrictlyIdeas.no Fact Sheet for Healthcare Providers: BankingDealers.co.za This test is not yet approved or cleared by the [place] FDA and has been authorized for detection and/or diagnosis of SARS-CoV-2 by FDA under an Emergency Use Authorization (EUA).  This EUA will remain in effect (meaning this test can be used) for the duration of  the COVID-19 declaration under Section 564(b)(1) of the Act, 21 U.S.C. section 360bbb-3(b)(1), unless the authorization is terminated or revoked sooner. Performed at [facility], [place] [address], [place], [place] [address]   Novel Coronavirus, NAA (hospital order; send-out to ref lab)     Status: None   Collection Time: 08/30/18 12:05 PM  Result Value Ref Range Status   SARS-CoV-2, NAA NOT DETECTED NOT DETECTED Final    Comment: (NOTE) This test was developed and its performance characteristics determined by [employer]. This test has not been FDA cleared or approved. This test has been authorized by FDA under an Emergency Use Authorization (EUA). This test has been validated in accordance with the FDA's Guidance Document (Policy for [place] in Laboratories Certified to Perform High Complexity Testing under CLIA prior to Emergency Use Authorization for Coronavirus [id number] during the [facility] Emergency) issued on February 29th, 2020. FDA independent review of this validation is pending. This test is only authorized for the duration of time the declaration that circumstances exist justifying the authorization of the emergency use of in vitro diagnostic tests for detection of SARS-CoV- 2 virus and/or diagnosis of COVID-19 infection under section 564(b)(1) of the Act, 21 U.S.C. 366YQI-3(K)(7), unless the authorization is terminated or revoked sooner. Performed At: [facility] [place], [place] [id number] Rush Farmer MD FI:[id number]    [place]  Final    Comment: Performed at [place] [address] [place], [place] [address]  Culture, respiratory (non-expectorated)     Status: None (Preliminary result)   Collection Time: 08/30/18  7:47 PM  Result Value Ref Range Status   Specimen Description   Final    ENDOTRACHEAL Performed at [place]  [address], [place], [place] [address]    Special Requests   Final    NONE Performed at [facility], [place] [address], [place], [place] [address]    Gram Stain   Final    ABUNDANT WBC PRESENT,BOTH PMN AND MONONUCLEAR ABUNDANT GRAM POSITIVE RODS Performed at [facility], [place] [address], [place], [place] [address]    Culture PENDING  Incomplete   Report Status PENDING  Incomplete         Radiology Studies: Dg Chest Port 1 View  Result Date: 08/30/2018 CLINICAL DATA:  Check endotracheal tube placement EXAM: PORTABLE CHEST 1 VIEW COMPARISON:  Film from earlier in the same day. FINDINGS: Endotracheal tube, gastric catheter and left subclavian central line are noted in satisfactory position. No pneumothorax is seen. Left basilar infiltrate with associated effusion is noted. Right-sided effusion is seen as well with mild basilar infiltrate/atelectasis. No bony abnormality is noted. IMPRESSION: Tubes and lines as described above. Electronically Signed   By: Inez Catalina M.D.   On: 08/30/2018 20:34   Dg Chest Port 1 View  Result Date: 08/30/2018 CLINICAL DATA:  COPD. EXAM: PORTABLE CHEST 1 VIEW COMPARISON:  August 28, 2018 FINDINGS: The focal infiltrate in the right base has improved. Left retrocardiac opacity remains. No other changes. IMPRESSION: Improving focal opacity in the right base. Stable to mildly improved left retrocardiac opacity. Electronically Signed   By: Dorise Bullion III M.D   On: 08/30/2018 09:19   Dg Abd Portable 1v  Result Date: 08/30/2018 CLINICAL DATA:  Check gastric catheter placement EXAM: PORTABLE ABDOMEN - 1 VIEW COMPARISON:  None. FINDINGS: Gastric catheter is noted extending into the mid stomach. No obstructive changes are seen. No free air is noted. Bibasilar changes are noted left greater than right similar to that seen on prior chest x-ray. IMPRESSION: Gastric catheter within the stomach. Electronically Signed   By: Inez Catalina M.D.   On:  08/30/2018 20:35        Scheduled Meds:  apixaban  5 mg Per Tube BID   chlorhexidine gluconate (MEDLINE KIT)  15 mL Mouth Rinse BID   Chlorhexidine Gluconate Cloth  6 each Topical Daily   diltiazem  30 mg Per Tube Q6H   feeding supplement (NEPRO CARB STEADY)  1,000 mL Per Tube Q24H   feeding supplement (PRO-STAT SUGAR FREE 64)  60 mL Per Tube TID   feeding supplement (VITAL HIGH PROTEIN)  1,000 mL Per Tube Q24H   fentaNYL (SUBLIMAZE) injection  100 mcg Intravenous Once   insulin aspart  3-9 Units Subcutaneous Q4H   insulin glargine  20 Units Subcutaneous Q breakfast   ipratropium-albuterol  3 mL Nebulization Q6H   levothyroxine  88 mcg Per Tube Q0600   mouth rinse  15 mL Mouth Rinse 10 times per day   methylPREDNISolone (SOLU-MEDROL) injection  40 mg Intravenous Q12H   midazolam  2 mg Intravenous Once   nystatin   Topical TID   pantoprazole sodium  40 mg Per Tube Q24H   pravastatin  20 mg Per Tube QPM   sodium chloride flush  10-40 mL Intracatheter Q12H   Continuous Infusions:  azithromycin Stopped (08/30/18 2333)   cefTRIAXone (ROCEPHIN)  IV 200 mL/hr at 08/30/18 2227   dexmedetomidine (PRECEDEX) IV infusion 0.4 mcg/kg/hr (08/31/18 1000)   fentaNYL infusion INTRAVENOUS 150 mcg/hr (08/31/18 1000)   phenylephrine (NEO-SYNEPHRINE) Adult infusion 15 mcg/min (08/31/18 1000)     LOS: 3 days    Time spent: 25 minutes    Barb Merino, MD [facility] Pager [phone]  If 7PM-7AM, please contact night-coverage [email or web address] Password [facility] 08/31/2018, 10:24 AM

## 2018-09-01 LAB — GLUCOSE, CAPILLARY
Glucose-Capillary: 213 mg/dL — ABNORMAL HIGH (ref 70–99)
Glucose-Capillary: 204 mg/dL — ABNORMAL HIGH (ref 70–99)
Glucose-Capillary: 181 mg/dL — ABNORMAL HIGH (ref 70–99)
Glucose-Capillary: 150 mg/dL — ABNORMAL HIGH (ref 70–99)
Glucose-Capillary: 159 mg/dL — ABNORMAL HIGH (ref 70–99)

## 2018-09-01 LAB — CREATININE, SERUM
Creatinine, Ser: 2.49 mg/dL — ABNORMAL HIGH (ref 0.44–1.00)
GFR calc Af Amer: 23 mL/min — ABNORMAL LOW (ref >60)
GFR calc non Af Amer: 20 mL/min — ABNORMAL LOW (ref >60)

## 2018-09-01 LAB — PROCALCITONIN: Procalcitonin: <0.1 ng/mL

## 2018-09-01 LAB — CULTURE, RESPIRATORY W GRAM STAIN: Culture: NORMAL

## 2018-09-01 LAB — PHOSPHORUS: Phosphorus: 6.5 mg/dL — ABNORMAL HIGH (ref 2.5–4.6)

## 2018-09-01 LAB — MAGNESIUM: Magnesium: 2.3 mg/dL (ref 1.7–2.4)

## 2018-09-01 LAB — CULTURE, RESPIRATORY

## 2018-09-01 MED ORDER — DM-GUAIFENESIN ER 30-600 MG PO TB12
1.0000 | ORAL_TABLET | Freq: Two times a day (BID) | ORAL | Status: DC | PRN
  Administered 2018-09-01 – 2018-09-02 (×2): 1 via ORAL
  Filled 2018-09-01: qty 1
  Filled 2018-09-01: qty 2

## 2018-09-01 MED ORDER — SIMETHICONE 80 MG PO CHEW
80.0000 mg | CHEWABLE_TABLET | Freq: Four times a day (QID) | ORAL | Status: DC | PRN
  Administered 2018-09-01 – 2018-09-03 (×2): 80 mg via ORAL
  Filled 2018-09-01 (×2): qty 1

## 2018-09-01 MED ORDER — INSULIN ASPART 100 UNIT/ML ~~LOC~~ SOLN: 0.0000 [IU] | Freq: Every day | SUBCUTANEOUS | Status: DC

## 2018-09-01 MED ORDER — ALPRAZOLAM 0.25 MG PO TABS
0.2500 mg | ORAL_TABLET | Freq: Three times a day (TID) | ORAL | Status: DC | PRN
  Administered 2018-09-01 – 2018-09-05 (×5): 0.25 mg via ORAL
  Filled 2018-09-01 (×5): qty 1

## 2018-09-01 MED ORDER — ONDANSETRON HCL 4 MG/2ML IJ SOLN
4.0000 mg | Freq: Four times a day (QID) | INTRAMUSCULAR | Status: DC | PRN
  Administered 2018-09-01 – 2018-09-06 (×5): 4 mg via INTRAVENOUS
  Filled 2018-09-01 (×5): qty 2

## 2018-09-01 MED ORDER — DILTIAZEM HCL 60 MG PO TABS
60.0000 mg | ORAL_TABLET | Freq: Four times a day (QID) | ORAL | Status: DC
  Administered 2018-09-01 – 2018-09-06 (×19): 60 mg via ORAL
  Filled 2018-09-01 (×19): qty 1

## 2018-09-01 MED ORDER — NITROGLYCERIN 0.4 MG SL SUBL
SUBLINGUAL_TABLET | SUBLINGUAL | Status: AC
  Filled 2018-09-01: qty 1

## 2018-09-01 MED ORDER — MORPHINE SULFATE ER 15 MG PO TBCR
15.0000 mg | EXTENDED_RELEASE_TABLET | Freq: Two times a day (BID) | ORAL | Status: DC
  Administered 2018-09-01 – 2018-09-06 (×11): 15 mg via ORAL
  Filled 2018-09-01 (×12): qty 1

## 2018-09-01 MED ORDER — INSULIN ASPART 100 UNIT/ML ~~LOC~~ SOLN
0.0000 [IU] | Freq: Three times a day (TID) | SUBCUTANEOUS | Status: DC
  Administered 2018-09-02 (×2): 3 [IU] via SUBCUTANEOUS
  Administered 2018-09-02 – 2018-09-05 (×9): 4 [IU] via SUBCUTANEOUS
  Administered 2018-09-05: 3 [IU] via SUBCUTANEOUS
  Administered 2018-09-06 (×3): 4 [IU] via SUBCUTANEOUS

## 2018-09-01 MED ORDER — PB-HYOSCY-ATROPINE-SCOPOLAMINE 16.2 MG/5ML PO ELIX
5.0000 mL | ORAL_SOLUTION | Freq: Once | ORAL | Status: AC
  Administered 2018-09-01: 16.2 mg via ORAL
  Filled 2018-09-01: qty 5

## 2018-09-01 MED ORDER — METOPROLOL TARTRATE 5 MG/5ML IV SOLN
2.5000 mg | INTRAVENOUS | Status: DC | PRN
  Administered 2018-09-01 – 2018-09-02 (×2): 5 mg via INTRAVENOUS
  Filled 2018-09-01 (×2): qty 5

## 2018-09-01 MED ORDER — DILTIAZEM HCL 25 MG/5ML IV SOLN
10.0000 mg | Freq: Once | INTRAVENOUS | Status: AC
  Administered 2018-09-01: 10 mg via INTRAVENOUS
  Filled 2018-09-01: qty 5

## 2018-09-01 NOTE — Evaluation (Signed)
Clinical/Bedside Swallow Evaluation Patient Details  Name: Renee Noble MRN: [id number] Date of Birth: 02-09-1956  Today's Date: 09/01/2018 Time: SLP Start Time (ACUTE ONLY): 60 SLP Stop Time (ACUTE ONLY): 1425 SLP Time Calculation (min) (ACUTE ONLY): 20 min  Past Medical History:  Past Medical History:  Diagnosis Date  . Abdominal mass of other site   . Cervical compression fracture ([place])   . CHF (congestive heart failure) ([place])   . Chronic kidney disease, stage 3 (HCC)    Borderline Stage 2-3  . Chronic lower limb pain   . COPD (chronic obstructive pulmonary disease) ([place])   . CTS (carpal tunnel syndrome)   . Depression   . Diabetes ([place])    Type II  . Fibromyalgia   . Hepatitis C    cured last year (2018)  . Hypercholesteremia   . Hypertension   . Hypothyroidism   . Morbid obesity ([place])   . Neuropathy    Diabetes  . OSA (obstructive sleep apnea)   . Osteoarthritis   . Renal cancer ([place])    Left Kidney Removed  . RLS (restless legs syndrome)   . Syncope   . Venous stasis    Past Surgical History:  Past Surgical History:  Procedure Laterality Date  . ABDOMINAL HERNIA REPAIR     x2  . ABDOMINAL HYSTERECTOMY    . BLADDER SUSPENSION    . CHOLECYSTECTOMY    . CYST EXCISION     Head  . INCISIONAL HERNIA REPAIR    . KNEE ARTHROSCOPY     Bilateral  . NEPHRECTOMY     Left  . TONSILLECTOMY    . TOOTH EXTRACTION    . UMBILICAL HERNIA REPAIR    . VAGINA SURGERY    . WISDOM TOOTH EXTRACTION     HPI:  Patient is a 63 y.o. female with PMH: fibromyalgia, CHF, nephrectomy, chronic hypoxic respiratory failure on 3 L continuous nasal cannula O2, obesity, DM-2, chronic diastolic heart failure, who presented to ED at [facility] with c/o SOB. for 3-4 days prior. She was tested twice for Covid with results negative both times. She became hypercapnic with increased WOB and was intubated on 4/15, extubated morning of 4/17.   Assessment / Plan / Recommendation Clinical  Impression  Patient presents with an oropharyngeal swallow that is WFL-WNL without any overt s/s of aspiration or penetration and no changes in vitals (respiratory rate, heart rate, SpO2%) while consuming thin liquids, regular solids, puree solids. Patient's voice remained clear throughout assessment. Although patient's swallow appears functional to WNL as per this assessment, as she has decreased respiratory functioning, she continues to be at risk of aspiration which could occur when fatigued or if increasing WOB or SOB during PO intake.  SLP Visit Diagnosis: Dysphagia, unspecified (R13.10)    Aspiration Risk  Mild aspiration risk;No limitations    Diet Recommendation Regular;Thin liquid   Liquid Administration via: Straw;Cup Medication Administration: Whole meds with liquid Supervision: Patient able to self feed Compensations: Minimize environmental distractions;Small sips/bites;Slow rate Postural Changes: Seated upright at 90 degrees    Other  Recommendations Oral Care Recommendations: Oral care BID   Follow up Recommendations None      Frequency and Duration   N/A         Prognosis   N/A     Swallow Study   General Date of Onset: 09/01/18 HPI: Patient is a 63 y.o. female with PMH: fibromyalgia, CHF, nephrectomy, chronic hypoxic respiratory failure on 3 L continuous nasal  cannula O2, obesity, DM-2, chronic diastolic heart failure, who presented to ED at [facility] with c/o SOB. for 3-4 days prior. She was tested twice for Covid with results negative both times. She became hypercapnic with increased WOB and was intubated on 4/15, extubated morning of 4/17. Type of Study: Bedside Swallow Evaluation Previous Swallow Assessment: N/A Diet Prior to this Study: Thin liquids Temperature Spikes Noted: No Respiratory Status: Nasal cannula History of Recent Intubation: Yes Length of Intubations (days): 3 days Date extubated: 09/01/18 Behavior/Cognition: Alert;Cooperative;Pleasant  mood Oral Cavity Assessment: Within Functional Limits Oral Care Completed by SLP: No Oral Cavity - Dentition: Adequate natural dentition Vision: Functional for self-feeding Self-Feeding Abilities: Able to feed self Patient Positioning: Upright in chair Baseline Vocal Quality: Normal Volitional Cough: Weak Volitional Swallow: Able to elicit    Oral/Motor/Sensory Function Overall Oral Motor/Sensory Function: Within functional limits   Ice Chips     Thin Liquid Thin Liquid: Within functional limits Presentation: Straw Other Comments: No overt s/s of aspiration or penetration, voice remained clear.    Nectar Thick     Honey Thick     Puree Puree: Within functional limits Presentation: Self Fed;Spoon   Solid     Solid: Within functional limits Presentation: Self Fed Other Comments: No increased RR or HR or decreased SpO2 during intake of graham crackers      [name] 09/01/2018,4:37 PM   [name], MA, CCC-SLP Speech Therapy [facility] Pager: [phone]

## 2018-09-01 NOTE — Progress Notes (Signed)
Pt was able to pass a yale swallow screen. Pt drank 4 oz of water without any aspiration, cough, or pauses. Diet has been changed to clear liquids. Will continue to monitor.

## 2018-09-01 NOTE — Progress Notes (Signed)
Weaning sedation

## 2018-09-01 NOTE — Progress Notes (Signed)
PROGRESS NOTE    Renee Noble  YWV:[id number] DOB: [birth year] DOA: 08/28/2018 PCP: Delorse Limber    Brief Narrative:  63 year old female with history of chronic hypoxic respiratory failure on 3 L nasal cannula at home, severe COPD, chronic diastolic heart failure, diabetes mellitus type 2, obesity, [facility] with acute on chronic respiratory failure in the setting of acute on chronic diastolic heart failure and COPD exacerbation.  Patient had 3 to 4 days of progressive shortness of breath and orthopnea along with 3 to 5 pound weight gain in 1 week.  She ported that her cousin who lives with her was diagnosed with pneumonia within the last week and is also being tested for COVID 19, negative results.  She denied any known or suspected COVID-19 contacts or recent traveling.  Patient was transferred to [facility] for COVID rule out.  Chest x-ray on admission shows bilateral interstitial/airspace opacities concerning with edema.  COVID 19 was negative.  Case discussed with Dr. Johnnye Sima, recommending given her cousin, COVID-19 test has been resubmitted. 08/30/2018 Patient became hypercapnic with increased work of breathing necessitating intubation and mechanical ventilation.  She had drop in blood pressure, received 1 L of fluid and then treated with low-dose Neo-Synephrine.    Assessment & Plan:   Principal Problem:   Acute on chronic respiratory failure with hypoxia (HCC) Active Problems:   Type II diabetes mellitus with renal manifestations (HCC)   Chronic pain syndrome   Acute on chronic diastolic CHF (congestive heart failure) (HCC)   Acute on chronic congestive heart failure (HCC)   Hypothyroidism   Acute exacerbation of chronic obstructive pulmonary disease (COPD) (HCC)   Atrial flutter with rapid ventricular response (HCC)   AKI (acute kidney injury) ([place])   Pressure ulcer  Acute on chronic hypoxemic and hypercapnic respiratory  failure secondary to COPD exacerbation: Thought to be secondary to COVID-19.  Her repeat test was negative.  After discussion with infection control team, isolation precautions discontinued. Patient on aggressive bronchodilator therapy, IV steroids, inhalational steroids.  Intubated due to work of breathing and hypercapnia.  Continue on mechanical ventilation until clinical improvement. Patient with adequate clinical improvement, today she is on weaning trial and hopefully will be successful. She will wear CPAP at night after weaning of the ventilator.  Acute on chronic diastolic heart failure, new onset atrial fibrillation with RVR: Recent echocardiogram with normal ejection fraction. Patient was treated with IV Lasix, blood pressure is low with elevated creatinine, discontinue Lasix. Patient was on Cardizem drip, changed to oral Cardizem now.  Her heart rate is well controlled now. Started on Eliquis.  Acute renal failure with history of chronic kidney disease stage III: Baseline creatinine about 1.1-1.17.  She had hypotensive episodes, rapid A. Fib. Diuretics discontinued. Continue to monitor intake and output, close monitoring of renal functions.  Hyperkalemia:  given a dose of Kayexalate .  Potassium normalized.  Will recheck tomorrow morning.  Type 2 diabetes: Fairly controlled on Lantus and sliding scale insulin.  Hypothyroidism: Clinically euthyroid on current regimen.  Continue.  Chronic pain syndrome:  On chronic opiates.  Will use less and less opiates as possible.   DVT prophylaxis: Eliquis Code Status: Full code Family Communication: None, critical care communicated with cousin. Disposition Plan: ICU Hoping to extubate today.   Consultants:   PCCM  Procedures:   ETT, 08/30/2018  Left subclavian central line, 08/30/2018    Antimicrobials:   Rocephin and azithromycin IV, 08/30/2018  Subjective: Patient was seen and examined at the bedside along with  critical care team. Patient was awake on ventilator.  She was asking for tube to come out. No other overnight events. Currently on weaning trial.  Objective: Vitals:   09/01/18 0530 09/01/18 0600 09/01/18 0740 09/01/18 0742  BP: (!) 107/52 (!) 111/49  (!) 122/53  Pulse: 78 78  (!) 123  Resp: _0 Temp: 99.1 F (37.3 C) 99.3 F (37.4 C)    TempSrc:      SpO2: 97% 97% 94% 94%  Weight:      Height:        Intake/Output Summary (Last 24 hours) at 09/01/2018 0917 Last data filed at 09/01/2018 0640 Gross per 24 hour  Intake 2351.07 ml  Output 715 ml  Net 1636.07 ml   Filed Weights   08/30/18 0300 08/31/18 0320 09/01/18 0500  Weight: (!) 138.1 kg 135.2 kg 135.9 kg    Examination:  General exam: Appears calm and comfortable on vent. Follows commands.  Anxious to have the tube taken out. Respiratory system: Clear to auscultation. Respiratory effort normal. Cardiovascular system: S1 & S2 heard, RRR. No JVD, murmurs, rubs, gallops or clicks.  Chronic venous stasis changes with pigmentation both legs.  None pedal edema. Gastrointestinal system: Abdomen is nondistended, soft and nontender. No organomegaly or masses felt. Normal bowel sounds heard.obese and pendulous. Central nervous system: Alert and awake on vent. No focal neurological deficits. Extremities: Symmetric 5 x 5 power. Skin: No rashes, lesions or ulcers Psychiatry: Judgement and insight appear normal. Mood & affect appropriate.     Data Reviewed: I have personally reviewed following labs and imaging studies  CBC: Recent Labs  Lab 08/28/18 1824 08/29/18 0412 08/30/18 0320 08/31/18 0330  WBC 5.7 3.4* 7.3 6.2  NEUTROABS 5.4 3.1  --   --   HGB 9.2* 9.6* 10.0* 8.8*  HCT 34.2* 35.3* 36.6 30.7*  MCV 88.1 88.9 88.2 84.8  PLT 66* 66* 104* 86*   Basic Metabolic Panel: Recent Labs  Lab 08/29/18 0412 08/30/18 0320 08/30/18 2018 08/31/18 0330 08/31/18 1521 09/01/18 0500  NA 139 134* 134* 134* 136  --   K  4.7 5.2* 5.4* 5.3* 4.9  --   CL 88* 85* 88* 88* 89*  --   CO2 38* 38* 35* 34* 34*  --   GLUCOSE 171* 207* 176* 163* 210*  --   BUN 52* 65* 73* 80* 93*  --   CREATININE 1.77* 2.42* 2.73* 2.82* 2.80*  --   CALCIUM 8.4* 8.1* 7.2* 7.4* 7.5*  --   MG 2.3  --  2.2 2.2 2.1 2.3  PHOS 7.0*  --  8.0* 5.8* 6.1* 6.5*   GFR: Estimated Creatinine Clearance: 27.6 mL/min (A) (by C-G formula based on SCr of 2.8 mg/dL (H)). Liver Function Tests: Recent Labs  Lab 08/28/18 1824 08/29/18 0412 08/30/18 0320  AST _1 ALT _2 ALKPHOS 62 61 60  BILITOT 1.0 1.0 0.5  PROT 7.0 6.9 7.3  ALBUMIN 4.0 3.8 4.0   No results for input(s): LIPASE, AMYLASE in the last 168 hours. No results for input(s): AMMONIA in the last 168 hours. Coagulation Profile: No results for input(s): INR, PROTIME in the last 168 hours. Cardiac Enzymes: Recent Labs  Lab 08/28/18 1824  TROPONINI 0.03*   BNP (last 3 results) No results for input(s): PROBNP in the last 8760 hours. HbA1C: No results for input(s): HGBA1C in the last 72 hours.  CBG: Recent Labs  Lab 08/31/18 1525 08/31/18 2007 08/31/18 2336 09/01/18 0326 09/01/18 0739  GLUCAP 199* 194* 224* 213* 204*   Lipid Profile: No results for input(s): CHOL, HDL, LDLCALC, TRIG, CHOLHDL, LDLDIRECT in the last 72 hours. Thyroid Function Tests: No results for input(s): TSH, T4TOTAL, FREET4, T3FREE, THYROIDAB in the last 72 hours. Anemia Panel: Recent Labs    08/29/18 1055  FERRITIN 36   Sepsis Labs: Recent Labs  Lab 08/29/18 0412 08/30/18 0320 08/31/18 0330 09/01/18 0500  PROCALCITON <0.10 <0.10 <0.10 <0.10  LATICACIDVEN  --  1.4  --   --     Recent Results (from the past 240 hour(s))  MRSA PCR Screening     Status: None   Collection Time: 08/28/18  9:58 PM  Result Value Ref Range Status   MRSA by PCR NEGATIVE NEGATIVE Final    Comment:        The GeneXpert MRSA Assay (FDA approved for NASAL specimens only), is one component of a  comprehensive MRSA colonization surveillance program. It is not intended to diagnose MRSA infection nor to guide or monitor treatment for MRSA infections. Performed at [facility], [address], [place], [place] [address]   Respiratory Panel by PCR     Status: None   Collection Time: 08/29/18  7:27 AM  Result Value Ref Range Status   Adenovirus NOT DETECTED NOT DETECTED Final   Coronavirus 229E NOT DETECTED NOT DETECTED Final    Comment: (NOTE) The Coronavirus on the Respiratory Panel, DOES NOT test for the novel  Coronavirus (2019 nCoV)    Coronavirus HKU1 NOT DETECTED NOT DETECTED Final   Coronavirus NL63 NOT DETECTED NOT DETECTED Final   Coronavirus OC43 NOT DETECTED NOT DETECTED Final   Metapneumovirus NOT DETECTED NOT DETECTED Final   Rhinovirus / Enterovirus NOT DETECTED NOT DETECTED Final   Influenza A NOT DETECTED NOT DETECTED Final   Influenza B NOT DETECTED NOT DETECTED Final   Parainfluenza Virus 1 NOT DETECTED NOT DETECTED Final   Parainfluenza Virus 2 NOT DETECTED NOT DETECTED Final   Parainfluenza Virus 3 NOT DETECTED NOT DETECTED Final   Parainfluenza Virus 4 NOT DETECTED NOT DETECTED Final   Respiratory Syncytial Virus NOT DETECTED NOT DETECTED Final   Bordetella pertussis NOT DETECTED NOT DETECTED Final   Chlamydophila pneumoniae NOT DETECTED NOT DETECTED Final   Mycoplasma pneumoniae NOT DETECTED NOT DETECTED Final    Comment: Performed at [facility], [place] [address] [place],  [address]  SARS Coronavirus 2 [facility] order, Performed in [facility] hospital lab)     Status: None   Collection Time: 08/29/18  7:27 AM  Result Value Ref Range Status   SARS Coronavirus 2 NEGATIVE NEGATIVE Final    Comment: (NOTE) If result is NEGATIVE SARS-CoV-2 target nucleic acids are NOT DETECTED. The SARS-CoV-2 RNA is generally detectable in upper and lower  respiratory specimens during the acute phase of infection. The lowest  concentration of SARS-CoV-2  viral copies this assay can detect is 250  copies / mL. A negative result does not preclude SARS-CoV-2 infection  and should not be used as the sole basis for treatment or other  patient management decisions.  A negative result may occur with  improper specimen collection / handling, submission of specimen other  than nasopharyngeal swab, presence of viral mutation(s) within the  areas targeted by this assay, and inadequate number of viral copies  (<250 copies / mL). A negative result must be combined with clinical  observations, patient history, and epidemiological information. If result is POSITIVE SARS-CoV-2 target nucleic acids are DETECTED. The SARS-CoV-2 RNA is generally detectable in upper and lower  respiratory specimens dur ing the acute phase of infection.  Positive  results are indicative of active infection with SARS-CoV-2.  Clinical  correlation with patient history and other diagnostic information is  necessary to determine patient infection status.  Positive results do  not rule out bacterial infection or co-infection with other viruses. If result is PRESUMPTIVE POSTIVE SARS-CoV-2 nucleic acids MAY BE PRESENT.   A presumptive positive result was obtained on the submitted specimen  and confirmed on repeat testing.  While 2019 novel coronavirus  (SARS-CoV-2) nucleic acids may be present in the submitted sample  additional confirmatory testing may be necessary for epidemiological  and / or clinical management purposes  to differentiate between  SARS-CoV-2 and other Sarbecovirus currently known to infect humans.  If clinically indicated additional testing with an alternate test  methodology [phone]) is advised. The SARS-CoV-2 RNA is generally  detectable in upper and lower respiratory sp ecimens during the acute  phase of infection. The expected result is Negative. Fact Sheet for Patients:  StrictlyIdeas.no Fact Sheet for Healthcare Providers:  BankingDealers.co.za This test is not yet approved or cleared by the [place] FDA and has been authorized for detection and/or diagnosis of SARS-CoV-2 by FDA under an Emergency Use Authorization (EUA).  This EUA will remain in effect (meaning this test can be used) for the duration of the COVID-19 declaration under Section 564(b)(1) of the Act, 21 U.S.C. section 360bbb-3(b)(1), unless the authorization is terminated or revoked sooner. Performed at [facility], [place] [address] [place], [place] [address]   Novel Coronavirus, NAA (hospital order; send-out to ref lab)     Status: None   Collection Time: 08/30/18 12:05 PM  Result Value Ref Range Status   SARS-CoV-2, NAA NOT DETECTED NOT DETECTED Final    Comment: (NOTE) This test was developed and its performance characteristics determined by [employer]. This test has not been FDA cleared or approved. This test has been authorized by FDA under an Emergency Use Authorization (EUA). This test has been validated in accordance with the FDA's Guidance Document (Policy for [place] in Laboratories Certified to Perform High Complexity Testing under CLIA prior to Emergency Use Authorization for Coronavirus [id number] during the [facility] Emergency) issued on February 29th, 2020. FDA independent review of this validation is pending. This test is only authorized for the duration of time the declaration that circumstances exist justifying the authorization of the emergency use of in vitro diagnostic tests for detection of SARS-CoV- 2 virus and/or diagnosis of COVID-19 infection under section 564(b)(1) of the Act, 21 U.S.C. 025KYH-0(W)(2), unless the authorization is terminated or revoked sooner. Performed At: [facility] [place], [place] [id number] Rush Farmer MD VO:[id number]    [place]  Final    Comment: Performed at [facility] [employer] [facility] [address] [place], [place] [address]  Culture, respiratory (non-expectorated)     Status: None (Preliminary result)   Collection Time: 08/30/18  7:47 PM  Result Value Ref Range Status   Specimen Description   Final    ENDOTRACHEAL Performed at [place] [address] [place], [place] [address]    Special Requests   Final    NONE Performed at [facility], [place] [address], [place], [place] [address]    Gram Stain  Final    ABUNDANT WBC PRESENT,BOTH PMN AND MONONUCLEAR ABUNDANT GRAM POSITIVE RODS    Culture   Final    CULTURE REINCUBATED FOR BETTER GROWTH Performed at [facility], [place] [address], [place], [place] [address]    Report Status PENDING  Incomplete         Radiology Studies: Dg Chest Port 1 View  Result Date: 08/30/2018 CLINICAL DATA:  Check endotracheal tube placement EXAM: PORTABLE CHEST 1 VIEW COMPARISON:  Film from earlier in the same day. FINDINGS: Endotracheal tube, gastric catheter and left subclavian central line are noted in satisfactory position. No pneumothorax is seen. Left basilar infiltrate with associated effusion is noted. Right-sided effusion is seen as well with mild basilar infiltrate/atelectasis. No bony abnormality is noted. IMPRESSION: Tubes and lines as described above. Electronically Signed   By: Inez Catalina M.D.   On: 08/30/2018 20:34   Dg Abd Portable 1v  Result Date: 08/30/2018 CLINICAL DATA:  Check gastric catheter placement EXAM: PORTABLE ABDOMEN - 1 VIEW COMPARISON:  None. FINDINGS: Gastric catheter is noted extending into the mid stomach. No obstructive changes are seen. No free air is noted. Bibasilar changes are noted left greater than right similar to that seen on prior chest x-ray. IMPRESSION: Gastric catheter within the stomach. Electronically Signed   By: Inez Catalina M.D.   On: 08/30/2018 20:35        Scheduled Meds: . apixaban  5 mg Per Tube BID  .  chlorhexidine gluconate (MEDLINE KIT)  15 mL Mouth Rinse BID  . Chlorhexidine Gluconate Cloth  6 each Topical Daily  . diltiazem  30 mg Per Tube Q6H  . feeding supplement (NEPRO CARB STEADY)  1,000 mL Per Tube Q24H  . feeding supplement (PRO-STAT SUGAR FREE 64)  60 mL Per Tube TID  . fentaNYL (SUBLIMAZE) injection  100 mcg Intravenous Once  . insulin aspart  3-9 Units Subcutaneous Q4H  . insulin glargine  15 Units Subcutaneous BID  . ipratropium-albuterol  3 mL Nebulization Q6H  . levothyroxine  88 mcg Per Tube Q0600  . mouth rinse  15 mL Mouth Rinse 10 times per day  . methylPREDNISolone (SOLU-MEDROL) injection  40 mg Intravenous Q12H  . midazolam  2 mg Intravenous Once  . nystatin   Topical TID  . pantoprazole sodium  40 mg Per Tube Q24H  . pravastatin  20 mg Per Tube QPM  . sodium chloride flush  10-40 mL Intracatheter Q12H   Continuous Infusions: . azithromycin Stopped (08/31/18 2140)  . cefTRIAXone (ROCEPHIN)  IV Stopped (08/31/18 2024)  . dexmedetomidine (PRECEDEX) IV infusion 0.6 mcg/kg/hr (09/01/18 0651)  . fentaNYL infusion INTRAVENOUS 150 mcg/hr (09/01/18 0600)  . phenylephrine (NEO-SYNEPHRINE) Adult infusion 5 mcg/min (09/01/18 7255)     LOS: 4 days    Time spent: 25 minutes    Barb Merino, MD [facility] Pager [phone]  If 7PM-7AM, please contact night-coverage [email or web address] Password [facility] 09/01/2018, 9:17 AM

## 2018-09-01 NOTE — Progress Notes (Signed)
Pt repositioned, awake, agitated, trying to mouth, "I want tube out."  Pt reassured and encouraged to rest.  Sedation increased as pt was quite agitated.

## 2018-09-01 NOTE — Evaluation (Signed)
Physical Therapy Evaluation Patient Details Name: Renee Noble MRN: [id number] DOB: January 15, 1956 Today's Date: 09/01/2018   History of Present Illness  Pt admitted through ED 08/28/18 2* acute on chronic respiratory failure.  Pt requiring intubation and extubated 09/01/18.  Pt wtih hx of syncope, DM, Fibromyalgia, CHF, and nephrectomy  Clinical Impression  Pt admitted as above and presenting with functional mobility limitations 2* generalized weakness, premorbid deconditioning, obesity, and ambulatory balance deficits.  Pt hopes to progress to return to previous living arrangement.    Follow Up Recommendations Home health PT    Equipment Recommendations  None recommended by PT    Recommendations for Other Services OT consult     Precautions / Restrictions Precautions Precautions: Fall Restrictions Weight Bearing Restrictions: No      Mobility  Bed Mobility Overal bed mobility: Needs Assistance Bed Mobility: Supine to Sit     Supine to sit: Mod assist;+2 for physical assistance;+2 for safety/equipment;HOB elevated     General bed mobility comments: assist to manage LEs and to bring trunk to upright sitting  Transfers Overall transfer level: Needs assistance Equipment used: Rolling walker (2 wheeled) Transfers: Sit to/from Stand Sit to Stand: From elevated surface;+2 physical assistance;+2 safety/equipment;Mod assist         General transfer comment: cues for transition position and use of UEs to self assist.  Ambulation/Gait Ambulation/Gait assistance: Min assist;Mod assist;+2 physical assistance;+2 safety/equipment Gait Distance (Feet): 2 Feet Assistive device: Rolling walker (2 wheeled) Gait Pattern/deviations: Step-to pattern;Decreased step length - right;Decreased step length - left;Shuffle;Trunk flexed Gait velocity: decr   General Gait Details: short distance bed to chair with cues for posture and position from RW.  Stairs            Wheelchair  Mobility    Modified Rankin (Stroke Patients Only)       Balance Overall balance assessment: Needs assistance Sitting-balance support: No upper extremity supported;Feet supported Sitting balance-Leahy Scale: Fair     Standing balance support: Bilateral upper extremity supported Standing balance-Leahy Scale: Poor                               Pertinent Vitals/Pain      Home Living Family/patient expects to be discharged to:: Private residence Living Arrangements: Other relatives Available Help at Discharge: Family;Available 24 hours/day Type of Home: House Home Access: Ramped entrance     Home Layout: One level Home Equipment: [place] - quad;Shower seat;Grab bars - tub/shower;Walker - 4 wheels Additional Comments: uses 3 L O2 [place] at baseline    Prior Function Level of Independence: Independent;Independent with assistive device(s)         Comments: household and short distanced community ambulator. Uses Rollator as needed.     Hand Dominance   Dominant Hand: Right    Extremity/Trunk Assessment   Upper Extremity Assessment Upper Extremity Assessment: Generalized weakness    Lower Extremity Assessment Lower Extremity Assessment: Generalized weakness       Communication   Communication: No difficulties  Cognition Arousal/Alertness: Awake/alert Behavior During Therapy: WFL for tasks assessed/performed Overall Cognitive Status: Within Functional Limits for tasks assessed                                        General Comments      Exercises     Assessment/Plan    PT  Assessment Patient needs continued PT services  PT Problem List Decreased strength;Decreased activity tolerance;Decreased balance;Decreased mobility;Decreased knowledge of use of DME;Obesity       PT Treatment Interventions DME instruction;Gait training;Functional mobility training;Therapeutic activities;Therapeutic exercise;Patient/family education    PT  Goals (Current goals can be found in the Care Plan section)  Acute Rehab PT Goals Patient Stated Goal: Resume previous lifestyle PT Goal Formulation: With patient Time For Goal Achievement: 09/15/18 Potential to Achieve Goals: Good    Frequency Min 3X/week   Barriers to discharge        Co-evaluation               AM-PAC PT "6 Clicks" Mobility  Outcome Measure Help needed turning from your back to your side while in a flat bed without using bedrails?: A Lot Help needed moving from lying on your back to sitting on the side of a flat bed without using bedrails?: A Lot Help needed moving to and from a bed to a chair (including a wheelchair)?: A Lot Help needed standing up from a chair using your arms (e.g., wheelchair or bedside chair)?: A Lot Help needed to walk in hospital room?: A Lot Help needed climbing 3-5 steps with a railing? : Total 6 Click Score: 11    End of Session Equipment Utilized During Treatment: Oxygen Activity Tolerance: Patient tolerated treatment well;Patient limited by lethargy Patient left: in chair;with call bell/phone within reach;with chair alarm set;with nursing/sitter in room Nurse Communication: Mobility status PT Visit Diagnosis: Difficulty in walking, not elsewhere classified (R26.2)    Time: 1340-1413 PT Time Calculation (min) (ACUTE ONLY): 33 min   Charges:   PT Evaluation $PT Eval Low Complexity: 1 Low PT Treatments $Therapeutic Activity: 8-22 mins        [place] Pager [phone] Office [phone]   Leatrice Parilla 09/01/2018, 4:19 PM

## 2018-09-01 NOTE — Procedures (Signed)
Extubation Procedure Note  Patient Details:   Name: Renee Noble DOB: [birth year] MRN: [id number]   Airway Documentation:    Vent end date: (not recorded) Vent end time: (not recorded)   Evaluation  O2 sats: stable throughout Complications: No apparent complications Patient did tolerate procedure well. Bilateral Breath Sounds: Clear   Yes  Elsie Stain 09/01/2018, 9:22 AM

## 2018-09-01 NOTE — Progress Notes (Signed)
Pt extubated per Dr. Lamonte Sakai. Leak test negative, Pt is able to follow simple command. Pt extubated to 5L [place]. Pt usually wears 3L [place] at home. Pt was able to talk and cough after extubation. RT will continue monitor

## 2018-09-01 NOTE — Progress Notes (Signed)
Hr sustaining in 140s. CVP 22. MD notified and aware. New orders given. Will continue to monitor patient.

## 2018-09-01 NOTE — Progress Notes (Signed)
NAME:  Renee Noble, MRN:  [id number], DOB:  1955-12-24, LOS: 4 ADMISSION DATE:  08/28/2018, CONSULTATION DATE:  08/29/2018 REFERRING MD:  Dr. Ree Kida, [facility], CHIEF COMPLAINT:  Short of breath   History   63 yo female former smoker presented to [facility] 4/13 with progressive dyspnea x 4 days. #l at home for COPD.  She lives with her cousin who was dx with PNA (reported COVID test negative).  CXR showed changes of pneumonia.  Initial COVID test was negative, but repeat result pending.  Developed progressive hypercapnia with altered mental status and required intubation 4/15.  Past Medical History  Severe COPD, chronic respiratory failure on 3 liters oxygen, diastolic CHF, DM II, Hypothyroidism, OSA, Depression, Chronic pain.   Significant Hospital Events   4/13 Admit 4/15 VDRF  Consults:    Procedures:  ETT 4/15 >> Lt [place] CVL 4/15 >>  COVID Testing:  Procalcitonin 4/14 >> less than 0.10 D dimer 4/14 >> 2.13 CRP 4/14 >> less than 0.8 LDH 4/14 >> 107 Ferritin 4/14 >> 26 Fibrinogen 4/14 >> 330 IL-6 4/15 >> 6.6  Micro Data:  RVP 4/14 >> negative COVID 4/14 >> negative Pneumococcal Ag 4/14 >> negative Legionella Ag 4/14 >> neg COVID 4/15 >> neg Tracheal aspirate 4/15 >  Antimicrobials:  Rocephin 4/15 >> Zithromax 4/15 >>   Interim history/subjective:  Remains on vent: Awake. Weaning well. No complaints.   Objective   Blood pressure (!) 122/53, pulse (!) 123, temperature 99.3 F (37.4 C), resp. rate 11, height 5\' 4"  (1.626 m), weight 135.9 kg, SpO2 94 %. CVP:  [16 mmHg-28 mmHg] 20 mmHg  Vent Mode: CPAP;PSV FiO2 (%):  [40 %] 40 % Set Rate:  [20 bmp] 20 bmp Vt Set:  [130 mL-430 mL] 430 mL PEEP:  [5 cmH20-8 cmH20] 5 cmH20 Pressure Support:  [5 cmH20] 5 cmH20 Plateau Pressure:  [23 cmH20-34 cmH20] 30 cmH20   Intake/Output Summary (Last 24 hours) at 09/01/2018 0757 Last data filed at 09/01/2018 0640 Gross per 24 hour  Intake 2574.12 ml  Output 775 ml  Net 1799.12 ml    Filed Weights   08/30/18 0300 08/31/18 0320 09/01/18 0500  Weight: (!) 138.1 kg 135.2 kg 135.9 kg    Examination: General:  Morbidly obese female in NAD on vent Neuro:  RASS 0 HEENT:  [place]/AT, No JVD noted, PERRL Cardiovascular:  Mildly tachy, irregular. A flutter on monitor.  Lungs:  Clear bilateral breath sounds.  Abdomen:  Soft, non-distended, non-tender. LLQ hernia. Non-reduceable. Chronic. Musculoskeletal:  No acute deformity. Lower extremity edema and appearance of venous stasis.  Skin:  Intact, MMM.   Resolved Hospital Problem list     Assessment & Plan:   Acute on chronic hypoxic, hypercapnic respiratory failure - Wean vent with plans to extubate today.  - VAP bundle in place.   COPD with acute exacerbation - Scheduled nebs  - Solumedrol 40mg  BID > transition to prednisone once extubated.   CAP. Discussion: Initial COVID negative, repeat negative as well.  Plan - DC airborne and contact precautions - tracheal aspirate for culture pending - Continue rocephin 5 day course. Narrow to augmentin once extubated.  - DC azithro as legionella negative, no COVID.  - Low threshold to DC ABX as PCT is negative.   Acute metabolic encephalopathy. Hx of chronic pain, depression. Plan - precedex, fentanyl gtt with prn versed for RASS goal 0 to -1  - wean sedation for extubation - restart outpt pamelor, MS contin. Will restart MS contin  at decreased dose to avoid over sedation  A fib with RVR. Hx of HTN with chronic diastolic CHF, HLD. Plan - cardizem, eliquis, pravachol per primary  AKI from prerenal azotemia >> baseline creatinine 1.17 from 07/31/18. She is s/p L nephrectopmy Acute urine retention Plan - per primary  DM type II with steroid induced hyperglycemia. Hx of hypothyroidism. Plan - SSI/Lantus per primary   Best practice:  Diet: SLP once extubated DVT prophylaxis: eliquis GI prophylaxis: protonix Mobility: bed rest Code Status: full code Family  Communication:  Updated caregiver and roommate margaret 4/17 about our hopes for extubation. She is needing [name] tells me she is updating the son. Does not want to give me his contact info. Name is Lisbeth Renshaw. Disposition: ICU  Labs   CBC: Recent Labs  Lab 08/28/18 1824 08/29/18 0412 08/30/18 0320 08/31/18 0330  WBC 5.7 3.4* 7.3 6.2  NEUTROABS 5.4 3.1  --   --   HGB 9.2* 9.6* 10.0* 8.8*  HCT 34.2* 35.3* 36.6 30.7*  MCV 88.1 88.9 88.2 84.8  PLT 66* 66* 104* 86*    Basic Metabolic Panel: Recent Labs  Lab 08/29/18 0412 08/30/18 0320 08/30/18 2018 08/31/18 0330 08/31/18 1521 09/01/18 0500  NA 139 134* 134* 134* 136  --   K 4.7 5.2* 5.4* 5.3* 4.9  --   CL 88* 85* 88* 88* 89*  --   CO2 38* 38* 35* 34* 34*  --   GLUCOSE 171* 207* 176* 163* 210*  --   BUN 52* 65* 73* 80* 93*  --   CREATININE 1.77* 2.42* 2.73* 2.82* 2.80*  --   CALCIUM 8.4* 8.1* 7.2* 7.4* 7.5*  --   MG 2.3  --  2.2 2.2 2.1 2.3  PHOS 7.0*  --  8.0* 5.8* 6.1* 6.5*   GFR: Estimated Creatinine Clearance: 27.6 mL/min (A) (by C-G formula based on SCr of 2.8 mg/dL (H)). Recent Labs  Lab 08/28/18 1824 08/29/18 0412 08/30/18 0320 08/31/18 0330 09/01/18 0500  PROCALCITON  --  <0.10 <0.10 <0.10 <0.10  WBC 5.7 3.4* 7.3 6.2  --   LATICACIDVEN  --   --  1.4  --   --     Liver Function Tests: Recent Labs  Lab 08/28/18 1824 08/29/18 0412 08/30/18 0320  AST 16 16 17   ALT 10 10 12   ALKPHOS 62 61 60  BILITOT 1.0 1.0 0.5  PROT 7.0 6.9 7.3  ALBUMIN 4.0 3.8 4.0   No results for input(s): LIPASE, AMYLASE in the last 168 hours. No results for input(s): AMMONIA in the last 168 hours.  ABG    Component Value Date/Time   PHART 7.329 (L) 08/30/2018 2319   PCO2ART 71.2 (HH) 08/30/2018 2319   PO2ART 120 (H) 08/30/2018 2319   HCO3 36.5 (H) 08/30/2018 2319   TCO2 36.8 07/15/2015 0425   O2SAT 98.7 08/30/2018 2319     Coagulation Profile: No results for input(s): INR, PROTIME in the last 168 hours.   Cardiac Enzymes: Recent Labs  Lab 08/28/18 1824  TROPONINI 0.03*    HbA1C: Hgb A1c MFr Bld  Date/Time Value Ref Range Status  06/12/2018 04:10 AM 5.6 4.8 - 5.6 % Final    Comment:    (NOTE) Pre diabetes:          5.7%-6.4% Diabetes:              >6.4% Glycemic control for   <7.0% adults with diabetes   04/09/2018 05:39 AM 5.2 4.8 - 5.6 % Final  Comment:    (NOTE) Pre diabetes:          5.7%-6.4% Diabetes:              >6.4% Glycemic control for   <7.0% adults with diabetes     CBG: Recent Labs  Lab 08/31/18 1525 08/31/18 2007 08/31/18 2336 09/01/18 0326 09/01/18 0739  GLUCAP 199* 194* 224* 213* 204*      Critical care time: 45 mins     Georgann Housekeeper, AGACNP-BC [place] Pager [phone] or [phone]  09/01/2018 7:57 AM

## 2018-09-02 LAB — GLUCOSE, CAPILLARY
Glucose-Capillary: 146 mg/dL — ABNORMAL HIGH (ref 70–99)
Glucose-Capillary: 149 mg/dL — ABNORMAL HIGH (ref 70–99)
Glucose-Capillary: 137 mg/dL — ABNORMAL HIGH (ref 70–99)
Glucose-Capillary: 155 mg/dL — ABNORMAL HIGH (ref 70–99)
Glucose-Capillary: 143 mg/dL — ABNORMAL HIGH (ref 70–99)
Glucose-Capillary: 145 mg/dL — ABNORMAL HIGH (ref 70–99)

## 2018-09-02 LAB — CBC WITH DIFFERENTIAL/PLATELET
Abs Immature Granulocytes: 0.05 10*3/uL (ref 0.00–0.07)
Basophils Absolute: 0 10*3/uL (ref 0.0–0.1)
Basophils Relative: 0 %
Eosinophils Absolute: 0 10*3/uL (ref 0.0–0.5)
Eosinophils Relative: 0 %
HCT: 34.2 % — ABNORMAL LOW (ref 36.0–46.0)
Hemoglobin: 9.5 g/dL — ABNORMAL LOW (ref 12.0–15.0)
Immature Granulocytes: 1 %
Lymphocytes Relative: 2 %
Lymphs Abs: 0.1 10*3/uL — ABNORMAL LOW (ref 0.7–4.0)
MCH: 23.6 pg — ABNORMAL LOW (ref 26.0–34.0)
MCHC: 27.8 g/dL — ABNORMAL LOW (ref 30.0–36.0)
MCV: 84.9 fL (ref 80.0–100.0)
Monocytes Absolute: 0.3 10*3/uL (ref 0.1–1.0)
Monocytes Relative: 4 %
Neutro Abs: 7.4 10*3/uL (ref 1.7–7.7)
Neutrophils Relative %: 93 %
Platelets: 84 10*3/uL — ABNORMAL LOW (ref 150–400)
RBC: 4.03 MIL/uL (ref 3.87–5.11)
RDW: 17.2 % — ABNORMAL HIGH (ref 11.5–15.5)
WBC: 7.9 10*3/uL (ref 4.0–10.5)
nRBC: 0 % (ref 0.0–0.2)

## 2018-09-02 LAB — BASIC METABOLIC PANEL
Anion gap: 12 (ref 5–15)
BUN: 111 mg/dL — ABNORMAL HIGH (ref 8–23)
CO2: 37 mmol/L — ABNORMAL HIGH (ref 22–32)
Calcium: 8.1 mg/dL — ABNORMAL LOW (ref 8.9–10.3)
Chloride: 89 mmol/L — ABNORMAL LOW (ref 98–111)
Creatinine, Ser: 2.1 mg/dL — ABNORMAL HIGH (ref 0.44–1.00)
GFR calc Af Amer: 28 mL/min — ABNORMAL LOW (ref >60)
GFR calc non Af Amer: 24 mL/min — ABNORMAL LOW (ref >60)
Glucose, Bld: 143 mg/dL — ABNORMAL HIGH (ref 70–99)
Potassium: 4.7 mmol/L (ref 3.5–5.1)
Sodium: 138 mmol/L (ref 135–145)

## 2018-09-02 MED ORDER — DOCUSATE SODIUM 50 MG/5ML PO LIQD
100.0000 mg | Freq: Two times a day (BID) | ORAL | Status: DC | PRN
  Filled 2018-09-02: qty 10

## 2018-09-02 MED ORDER — AMOXICILLIN-POT CLAVULANATE 875-125 MG PO TABS
1.0000 | ORAL_TABLET | Freq: Two times a day (BID) | ORAL | Status: DC
  Administered 2018-09-02 – 2018-09-06 (×9): 1 via ORAL
  Filled 2018-09-02 (×9): qty 1

## 2018-09-02 MED ORDER — LEVOTHYROXINE SODIUM 88 MCG PO TABS
88.0000 ug | ORAL_TABLET | Freq: Every day | ORAL | Status: DC
  Administered 2018-09-03 – 2018-09-06 (×4): 88 ug via ORAL
  Filled 2018-09-02 (×4): qty 1

## 2018-09-02 MED ORDER — PANTOPRAZOLE SODIUM 40 MG PO PACK
40.0000 mg | PACK | ORAL | Status: DC
  Administered 2018-09-02: 22:00:00 40 mg via ORAL
  Filled 2018-09-02: qty 20

## 2018-09-02 MED ORDER — FUROSEMIDE 10 MG/ML IJ SOLN
40.0000 mg | Freq: Two times a day (BID) | INTRAMUSCULAR | Status: DC
  Administered 2018-09-02: 08:00:00 40 mg via INTRAVENOUS
  Filled 2018-09-02: qty 4

## 2018-09-02 MED ORDER — SENNOSIDES-DOCUSATE SODIUM 8.6-50 MG PO TABS
1.0000 | ORAL_TABLET | Freq: Every day | ORAL | Status: DC
  Administered 2018-09-02 – 2018-09-06 (×5): 1 via ORAL
  Filled 2018-09-02 (×5): qty 1

## 2018-09-02 MED ORDER — APIXABAN 5 MG PO TABS
5.0000 mg | ORAL_TABLET | Freq: Two times a day (BID) | ORAL | Status: DC
  Administered 2018-09-02 – 2018-09-06 (×9): 5 mg via ORAL
  Filled 2018-09-02 (×10): qty 1

## 2018-09-02 MED ORDER — ACETAMINOPHEN 650 MG RE SUPP: 650.0000 mg | Freq: Four times a day (QID) | RECTAL | Status: DC | PRN

## 2018-09-02 MED ORDER — LABETALOL HCL 5 MG/ML IV SOLN
10.0000 mg | INTRAVENOUS | Status: DC | PRN
  Administered 2018-09-02 – 2018-09-11 (×3): 10 mg via INTRAVENOUS
  Filled 2018-09-02 (×3): qty 4

## 2018-09-02 MED ORDER — PRAVASTATIN SODIUM 20 MG PO TABS
20.0000 mg | ORAL_TABLET | Freq: Every evening | ORAL | Status: DC
  Administered 2018-09-02 – 2018-09-06 (×5): 20 mg via ORAL
  Filled 2018-09-02 (×5): qty 1

## 2018-09-02 MED ORDER — FUROSEMIDE 10 MG/ML IJ SOLN
4.0000 mg/h | INTRAVENOUS | Status: DC
  Administered 2018-09-02: 4 mg/h via INTRAVENOUS
  Administered 2018-09-04: 8 mg/h via INTRAVENOUS
  Administered 2018-09-06: 4 mg/h via INTRAVENOUS
  Filled 2018-09-02 (×6): qty 25

## 2018-09-02 MED ORDER — ACETAMINOPHEN 325 MG PO TABS
650.0000 mg | ORAL_TABLET | Freq: Four times a day (QID) | ORAL | Status: DC | PRN
  Administered 2018-09-04 – 2018-09-10 (×4): 650 mg via ORAL
  Filled 2018-09-02 (×4): qty 2

## 2018-09-02 NOTE — Progress Notes (Signed)
PROGRESS NOTE    Renee Noble  YNW:[id number] DOB: Mar 06, 1956 DOA: 08/28/2018 PCP: Delorse Limber    Brief Narrative:  63 year old female with history of chronic hypoxic respiratory failure on 3 L nasal cannula at home, severe COPD, chronic diastolic heart failure, diabetes mellitus type 2, obesity, [facility]2018 with acute on chronic respiratory failure in the setting of acute on chronic diastolic heart failure and COPD exacerbation.  Patient had 3 to 4 days of progressive shortness of breath and orthopnea along with 3 to 5 pound weight gain in 1 week.  She ported that her cousin who lives with her was diagnosed with pneumonia within the last week and is also being tested for COVID 19, negative results.  She denied any known or suspected COVID-19 contacts or recent traveling.  Patient was transferred to [facility] for COVID rule out.  Chest x-ray on admission shows bilateral interstitial/airspace opacities concerning with edema.  COVID 19 was negative.  Case discussed with Dr. Johnnye Sima, recommending given her cousin, COVID-19 test has been resubmitted and it is negative. 08/30/2018 Patient became hypercapnic with increased work of breathing necessitating intubation and mechanical ventilation.  She had drop in blood pressure, received 1 L of fluid and then treated with low-dose Neo-Synephrine.  Extubated to nasal canula on 09/01/2018   Assessment & Plan:   Principal Problem:   Acute on chronic respiratory failure with hypoxia (HCC) Active Problems:   Type II diabetes mellitus with renal manifestations (HCC)   Chronic pain syndrome   Acute on chronic diastolic CHF (congestive heart failure) (HCC)   Acute on chronic congestive heart failure (HCC)   Hypothyroidism   Acute exacerbation of chronic obstructive pulmonary disease (COPD) (HCC)   Atrial flutter with rapid ventricular response (HCC)   AKI (acute kidney injury) ([place])   Pressure ulcer   Acute on chronic hypoxemic and hypercapnic respiratory failure secondary to COPD exacerbation: Thought to be secondary to COVID-19.  Her repeat test was negative.  After discussion with infection control team, isolation precautions discontinued. Patient on aggressive bronchodilator therapy, IV steroids, inhalational steroids.  Status post mechanical ventilation and currently on nasal cannula oxygen.  Clinically improving.  Advised strict CPAP as able. Patient was treated with Rocephin and azithromycin, discontinue.  Will treat with 5 more days of Augmentin.  Acute on chronic diastolic heart failure.  Recent echocardiogram with normal ejection fraction. Patient is 30 pound overweight from her baseline weight of 273 pound. Renal functions fluctuating.  Lasix was on hold.  Her blood pressures are adequate. We will give a bolus of 40 mg and keep on Lasix infusion. Continue monitoring intake and output.  Daily weight.  Paroxysmal A. Fib: Patient with history of A. fib.  Went into RVR with respiratory compromise.  Now controlled with oral Cardizem.  Will continue.  Started on Eliquis.  Acute renal failure with history of chronic kidney disease stage III: Baseline creatinine about 1.1-1.17.  She had hypotensive episodes, rapid A. Fib. Continue to monitor intake and output, close monitoring of renal functions. Creatinine 2.1 , will start patient on lasix infusion, close renal function monitoring.   Hyperkalemia:  given a dose of Kayexalate .  Potassium normalized.    Type 2 diabetes: Fairly controlled on Lantus and sliding scale insulin.  Hypothyroidism: Clinically euthyroid on current regimen.  Continue.  Chronic pain syndrome:  On chronic opiates.  Will use less and less opiates as possible.   DVT prophylaxis: Eliquis Code  Status: Full code Family Communication: cousin. Disposition Plan: step down.    Consultants:   PCCM  Procedures:   ETT, 08/30/2018- 09/01/2018  Left  subclavian central line, 08/30/2018   Antimicrobials:   Rocephin and azithromycin IV, 08/30/2018- 09/02/2018  Augmentin 09/02/2018   Subjective: Patient was seen and examined.  Overnight she had episode of vomiting and could not tolerate CPAP. Today she is without any symptoms.  On 3 L of oxygen.  Denies any nausea vomiting.  Denies any cough.  Afebrile. Heart rate fluctuating and mostly remains 100s.   Objective: Vitals:   09/02/18 0500 09/02/18 0600 09/02/18 0700 09/02/18 0800  BP: (!) 147/62 (!) 160/111 (!) 131/52 (!) 128/54  Pulse: (!) 115 (!) 110 (!) 120 (!) 103  Resp: _0 (!) 9  Temp:      TempSrc:      SpO2: 95% 98% 94% 93%  Weight: (!) 139.1 kg     Height:        Intake/Output Summary (Last 24 hours) at 09/02/2018 1006 Last data filed at 09/02/2018 0800 Gross per 24 hour  Intake 153.24 ml  Output 2175 ml  Net -2021.76 ml   Filed Weights   08/31/18 0320 09/01/18 0500 09/02/18 0500  Weight: 135.2 kg 135.9 kg (!) 139.1 kg    Examination:  General exam: Appears calm and comfortable.  On 3 L.  Comfortable.  Can finish sentences. Respiratory system: Clear to auscultation. Respiratory effort normal. Cardiovascular system: S1 & S2 heard, irregularly irregular. No JVD, murmurs, rubs, gallops or clicks.  Chronic venous stasis changes with pigmentation both legs.  None pedal edema. Gastrointestinal system: Abdomen is nondistended, soft and nontender. No organomegaly or masses felt. Normal bowel sounds heard.obese and pendulous. Central nervous system: Alert and awake on vent. No focal neurological deficits. Extremities: Symmetric 5 x 5 power. Skin: No rashes, lesions or ulcers Psychiatry: Judgement and insight appear normal. Mood & affect appropriate.     Data Reviewed: I have personally reviewed following labs and imaging studies  CBC: Recent Labs  Lab 08/28/18 1824 08/29/18 0412 08/30/18 0320 08/31/18 0330 09/02/18 0111  WBC 5.7 3.4* 7.3 6.2 7.9   NEUTROABS 5.4 3.1  --   --  7.4  HGB 9.2* 9.6* 10.0* 8.8* 9.5*  HCT 34.2* 35.3* 36.6 30.7* 34.2*  MCV 88.1 88.9 88.2 84.8 84.9  PLT 66* 66* 104* 86* 84*   Basic Metabolic Panel: Recent Labs  Lab 08/29/18 0412 08/30/18 0320 08/30/18 2018 08/31/18 0330 08/31/18 1521 09/01/18 0500 09/01/18 0826 09/02/18 0111  NA 139 134* 134* 134* 136  --   --  138  K 4.7 5.2* 5.4* 5.3* 4.9  --   --  4.7  CL 88* 85* 88* 88* 89*  --   --  89*  CO2 38* 38* 35* 34* 34*  --   --  37*  GLUCOSE 171* 207* 176* 163* 210*  --   --  143*  BUN 52* 65* 73* 80* 93*  --   --  111*  CREATININE 1.77* 2.42* 2.73* 2.82* 2.80*  --  2.49* 2.10*  CALCIUM 8.4* 8.1* 7.2* 7.4* 7.5*  --   --  8.1*  MG 2.3  --  2.2 2.2 2.1 2.3  --   --   PHOS 7.0*  --  8.0* 5.8* 6.1* 6.5*  --   --    GFR: Estimated Creatinine Clearance: 37.3 mL/min (A) (by C-G formula based on SCr of 2.1 mg/dL (H)). Liver Function Tests: Recent  Labs  Lab 08/28/18 1824 08/29/18 0412 08/30/18 0320  AST _0 ALT _1 ALKPHOS 62 61 60  BILITOT 1.0 1.0 0.5  PROT 7.0 6.9 7.3  ALBUMIN 4.0 3.8 4.0   No results for input(s): LIPASE, AMYLASE in the last 168 hours. No results for input(s): AMMONIA in the last 168 hours. Coagulation Profile: No results for input(s): INR, PROTIME in the last 168 hours. Cardiac Enzymes: Recent Labs  Lab 08/28/18 1824  TROPONINI 0.03*   BNP (last 3 results) No results for input(s): PROBNP in the last 8760 hours. HbA1C: No results for input(s): HGBA1C in the last 72 hours. CBG: Recent Labs  Lab 09/01/18 1122 09/01/18 1545 09/01/18 2019 09/02/18 0325 09/02/18 0759  GLUCAP 181* 150* 159* 146* 149*   Lipid Profile: No results for input(s): CHOL, HDL, LDLCALC, TRIG, CHOLHDL, LDLDIRECT in the last 72 hours. Thyroid Function Tests: No results for input(s): TSH, T4TOTAL, FREET4, T3FREE, THYROIDAB in the last 72 hours. Anemia Panel: No results for input(s): VITAMINB12, FOLATE, FERRITIN, TIBC, IRON,  RETICCTPCT in the last 72 hours. Sepsis Labs: Recent Labs  Lab 08/29/18 0412 08/30/18 0320 08/31/18 0330 09/01/18 0500  PROCALCITON <0.10 <0.10 <0.10 <0.10  LATICACIDVEN  --  1.4  --   --     Recent Results (from the past 240 hour(s))  MRSA PCR Screening     Status: None   Collection Time: 08/28/18  9:58 PM  Result Value Ref Range Status   MRSA by PCR NEGATIVE NEGATIVE Final    Comment:        The GeneXpert MRSA Assay (FDA approved for NASAL specimens only), is one component of a comprehensive MRSA colonization surveillance program. It is not intended to diagnose MRSA infection nor to guide or monitor treatment for MRSA infections. Performed at [facility], [address], [place], [place] [address]   Respiratory Panel by PCR     Status: None   Collection Time: 08/29/18  7:27 AM  Result Value Ref Range Status   Adenovirus NOT DETECTED NOT DETECTED Final   Coronavirus 229E NOT DETECTED NOT DETECTED Final    Comment: (NOTE) The Coronavirus on the Respiratory Panel, DOES NOT test for the novel  Coronavirus (2019 nCoV)    Coronavirus HKU1 NOT DETECTED NOT DETECTED Final   Coronavirus NL63 NOT DETECTED NOT DETECTED Final   Coronavirus OC43 NOT DETECTED NOT DETECTED Final   Metapneumovirus NOT DETECTED NOT DETECTED Final   Rhinovirus / Enterovirus NOT DETECTED NOT DETECTED Final   Influenza A NOT DETECTED NOT DETECTED Final   Influenza B NOT DETECTED NOT DETECTED Final   Parainfluenza Virus 1 NOT DETECTED NOT DETECTED Final   Parainfluenza Virus 2 NOT DETECTED NOT DETECTED Final   Parainfluenza Virus 3 NOT DETECTED NOT DETECTED Final   Parainfluenza Virus 4 NOT DETECTED NOT DETECTED Final   Respiratory Syncytial Virus NOT DETECTED NOT DETECTED Final   Bordetella pertussis NOT DETECTED NOT DETECTED Final   Chlamydophila pneumoniae NOT DETECTED NOT DETECTED Final   Mycoplasma pneumoniae NOT DETECTED NOT DETECTED Final    Comment: Performed at [facility], [place] [address] [place], [place] [address]  SARS Coronavirus 2 [facility] order, Performed in [facility] hospital lab)     Status: None   Collection Time: 08/29/18  7:27 AM  Result Value Ref Range Status   SARS Coronavirus 2 NEGATIVE NEGATIVE Final    Comment: (NOTE) If result is NEGATIVE SARS-CoV-2 target nucleic acids are NOT  DETECTED. The SARS-CoV-2 RNA is generally detectable in upper and lower  respiratory specimens during the acute phase of infection. The lowest  concentration of SARS-CoV-2 viral copies this assay can detect is 250  copies / mL. A negative result does not preclude SARS-CoV-2 infection  and should not be used as the sole basis for treatment or other  patient management decisions.  A negative result may occur with  improper specimen collection / handling, submission of specimen other  than nasopharyngeal swab, presence of viral mutation(s) within the  areas targeted by this assay, and inadequate number of viral copies  (<250 copies / mL). A negative result must be combined with clinical  observations, patient history, and epidemiological information. If result is POSITIVE SARS-CoV-2 target nucleic acids are DETECTED. The SARS-CoV-2 RNA is generally detectable in upper and lower  respiratory specimens dur ing the acute phase of infection.  Positive  results are indicative of active infection with SARS-CoV-2.  Clinical  correlation with patient history and other diagnostic information is  necessary to determine patient infection status.  Positive results do  not rule out bacterial infection or co-infection with other viruses. If result is PRESUMPTIVE POSTIVE SARS-CoV-2 nucleic acids MAY BE PRESENT.   A presumptive positive result was obtained on the submitted specimen  and confirmed on repeat testing.  While 2019 novel coronavirus  (SARS-CoV-2) nucleic acids may be present in the submitted sample  additional confirmatory testing may be necessary for epidemiological  and /  or clinical management purposes  to differentiate between  SARS-CoV-2 and other Sarbecovirus currently known to infect humans.  If clinically indicated additional testing with an alternate test  methodology [phone]) is advised. The SARS-CoV-2 RNA is generally  detectable in upper and lower respiratory sp ecimens during the acute  phase of infection. The expected result is Negative. Fact Sheet for Patients:  StrictlyIdeas.no Fact Sheet for Healthcare Providers: BankingDealers.co.za This test is not yet approved or cleared by the [place] FDA and has been authorized for detection and/or diagnosis of SARS-CoV-2 by FDA under an Emergency Use Authorization (EUA).  This EUA will remain in effect (meaning this test can be used) for the duration of the COVID-19 declaration under Section 564(b)(1) of the Act, 21 U.S.C. section 360bbb-3(b)(1), unless the authorization is terminated or revoked sooner. Performed at [facility], [place] [address] [place], [place] [address]   Novel Coronavirus, NAA (hospital order; send-out to ref lab)     Status: None   Collection Time: 08/30/18 12:05 PM  Result Value Ref Range Status   SARS-CoV-2, NAA NOT DETECTED NOT DETECTED Final    Comment: (NOTE) This test was developed and its performance characteristics determined by [employer]. This test has not been FDA cleared or approved. This test has been authorized by FDA under an Emergency Use Authorization (EUA). This test has been validated in accordance with the FDA's Guidance Document (Policy for [place] in Laboratories Certified to Perform High Complexity Testing under CLIA prior to Emergency Use Authorization for Coronavirus [id number] during the [facility]) issued on February 29th, 2020. FDA independent review of this validation is pending. This test is only authorized for the duration of time the declaration  that circumstances exist justifying the authorization of the emergency use of in vitro diagnostic tests for detection of SARS-CoV- 2 virus and/or diagnosis of COVID-19 infection under section 564(b)(1) of the Act, 21 U.S.C. 619JKD-3(O)(6), unless the authorization is terminated or revoked sooner. Performed At: [facility]  [facility]  [place], [place] [id number] Rush Farmer MD BA:[id number]    Coronavirus Source NASOPHARYNGEAL  Final    Comment: Performed at [facility] [address] [place], [place] [address]  Culture, respiratory (non-expectorated)     Status: None   Collection Time: 08/30/18  7:47 PM  Result Value Ref Range Status   Specimen Description   Final    ENDOTRACHEAL Performed at [facility] [address] [place], [place] [address]    Special Requests   Final    NONE Performed at [facility], [place] [address], [place], [place] [address]    Gram Stain   Final    ABUNDANT WBC PRESENT,BOTH PMN AND MONONUCLEAR ABUNDANT GRAM POSITIVE RODS    Culture   Final    Consistent with normal respiratory flora. Performed at [facility], [place] [address] [place], [place] [address]    Report Status 09/01/2018 FINAL  Final         Radiology Studies: No results found.      Scheduled Meds: . amoxicillin-clavulanate  1 tablet Oral Q12H  . apixaban  5 mg Per Tube BID  . chlorhexidine gluconate (MEDLINE KIT)  15 mL Mouth Rinse BID  . Chlorhexidine Gluconate Cloth  6 each Topical Daily  . diltiazem  60 mg Oral Q6H  . fentaNYL (SUBLIMAZE) injection  100 mcg Intravenous Once  . insulin aspart  0-20 Units Subcutaneous TID WC  . insulin aspart  0-5 Units Subcutaneous QHS  . insulin glargine  15 Units Subcutaneous BID  . ipratropium-albuterol  3 mL Nebulization Q6H  . levothyroxine  88 mcg Per Tube Q0600  . methylPREDNISolone (SOLU-MEDROL) injection  40 mg Intravenous Q12H  . midazolam  2  mg Intravenous Once  . morphine  15 mg Oral Q12H  . nystatin   Topical TID  . pantoprazole sodium  40 mg Oral Q24H  . pravastatin  20 mg Oral QPM  . sodium chloride flush  10-40 mL Intracatheter Q12H   Continuous Infusions: . furosemide (LASIX) infusion       LOS: 5 days    Time spent: 25 minutes    Barb Merino, MD [facility] Pager [phone]  If 7PM-7AM, please contact night-coverage [email or web address] Password [email or web address] 09/02/2018, 10:06 AM

## 2018-09-02 NOTE — Progress Notes (Signed)
Patient refuses CPAP due to nausea and vomiting.RT will continue to monitor

## 2018-09-02 NOTE — Progress Notes (Signed)
Entered room to check on patient who states that she needs to vomit.  Patient removed from CPAP mask and placed on [place] as previously ordered.  RN in room and stated that it has been an hour since patient vomited last.  Will stop CPAP for the night and try again tomorrow night.

## 2018-09-02 NOTE — Progress Notes (Signed)
OT Cancellation Note  Patient Details Name: Renee Noble MRN: [id number] DOB: Sep 30, 1955   Cancelled Treatment:      OT deferred this am .  Pt reports N&V overnight and currently too nauseous to attempt movement.  Will follow.  Kari Baars, OT Acute Rehabilitation Services [phone] Office- [phone], Edwena Felty D 09/02/2018, 1:14 PM

## 2018-09-02 NOTE — Progress Notes (Signed)
ANTICOAGULATION CONSULT NOTE - Follow up  Pharmacy Consult for apixaban Indication: atrial fibrillation  Allergies  Allergen Reactions  . Gabapentin Anaphylaxis  . Lyrica [Pregabalin] Shortness Of Breath    Trouble breathing  . Ketorolac Tromethamine Hives  . Lisinopril Cough    Patient Measurements: Height: 5\' 4"  (162.6 cm) Weight: (!) 306 lb 10.6 oz (139.1 kg) IBW/kg (Calculated) : 54.7   Vital Signs: Temp: 97.7 F (36.5 C) (04/18 0400) Temp Source: Oral (04/18 0400) BP: 128/54 (04/18 0800) Pulse Rate: 103 (04/18 0800)  Labs: Recent Labs    08/31/18 0330 08/31/18 1521 09/01/18 0826 09/02/18 0111  HGB 8.8*  --   --  9.5*  HCT 30.7*  --   --  34.2*  PLT 86*  --   --  84*  CREATININE 2.82* 2.80* 2.49* 2.10*    Estimated Creatinine Clearance: 37.3 mL/min (A) (by C-G formula based on SCr of 2.1 mg/dL (H)).   Medical History: Past Medical History:  Diagnosis Date  . Abdominal mass of other site   . Cervical compression fracture ([place])   . CHF (congestive heart failure) ([place])   . Chronic kidney disease, stage 3 (HCC)    Borderline Stage 2-3  . Chronic lower limb pain   . COPD (chronic obstructive pulmonary disease) ([place])   . CTS (carpal tunnel syndrome)   . Depression   . Diabetes ([place])    Type II  . Fibromyalgia   . Hepatitis C    cured last year (2018)  . Hypercholesteremia   . Hypertension   . Hypothyroidism   . Morbid obesity ([place])   . Neuropathy    Diabetes  . OSA (obstructive sleep apnea)   . Osteoarthritis   . 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 RLS (restless legs syndrome)   . Syncope   . Venous stasis     Medications:  Medications Prior to Admission  Medication Sig Dispense Refill Last Dose  . albuterol (PROAIR HFA) 108 (90 Base) MCG/ACT inhaler Inhale 1-2 puffs into the lungs every 6 (six) hours as needed for wheezing or shortness of breath. 1 Inhaler 5 08/27/2018 at Unknown time  . budesonide-formoterol (SYMBICORT) 160-4.5  MCG/ACT inhaler Inhale 2 puffs into the lungs 2 (two) times daily. 1 Inhaler 11 08/28/2018 at Unknown time  . clotrimazole-betamethasone (LOTRISONE) cream Apply 1 application topically 2 (two) times daily as needed. (Patient taking differently: Apply 1 application topically 2 (two) times daily as needed (yeast). ) 45 g 0 Past Week at Unknown time  . dextromethorphan-guaiFENesin (MUCINEX DM) 30-600 MG 12hr tablet Take 1 tablet by mouth 2 (two) times daily as needed for cough. (Patient taking differently: Take 1 tablet by mouth 2 (two) times daily. ) 20 tablet 0 08/28/2018 at Unknown time  . diltiazem (CARDIZEM CD) 180 MG 24 hr capsule Take 1 capsule (180 mg total) by mouth daily. 90 capsule 1 08/28/2018 at Unknown time  . glucose blood (ACCU-CHEK AVIVA) test strip Check blood sugars 4 times per day for diabetes. Dx:E11.29 100 each 12 Past Month at Unknown time  . insulin glargine (LANTUS) 100 UNIT/ML injection Inject 0.15 mLs (15 Units total) into the skin daily. (Patient taking differently: Inject 31 Units into the skin every morning. ) 10 mL 11 08/28/2018 at Unknown time  . insulin lispro (HUMALOG) 100 UNIT/ML injection Inject 0.15 mLs (15 Units total) into the skin 3 (three) times daily as needed for high blood sugar. Uses sliding scale. Sugar over 200  will give insulin. 10 mL 1 08/26/2018 at Unknown time  . ipratropium-albuterol (DUONEB) 0.5-2.5 (3) MG/3ML SOLN Take 3 mLs by nebulization 4 (four) times daily. Dx: J44.9 (Patient taking differently: Take 3 mLs by nebulization every 6 (six) hours as needed (shortness of breath). ) 360 mL 5 08/27/2018 at Unknown time  . levothyroxine (SYNTHROID, LEVOTHROID) 88 MCG tablet TAKE ONE TABLET BY MOUTH DAILY (Patient taking differently: Take 88 mcg by mouth every evening. ) 90 tablet 1 08/27/2018 at Unknown time  . morphine (MS CONTIN) 30 MG 12 hr tablet Take 30 mg by mouth every 12 (twelve) hours.  0 08/25/2018 at Unknown time  . Multiple Vitamins-Minerals (CENTRUM  SILVER PO) Take by mouth.   08/28/2018 at Unknown time  . nortriptyline (PAMELOR) 25 MG capsule Take 1 capsule (25 mg total) by mouth at bedtime. 90 capsule 1 08/27/2018 at Unknown time  . nystatin (MYCOSTATIN/NYSTOP) powder APPLY TOPICALLY TWICE DAILY AS NEEDED FOR YEAST INFECTION (Patient taking differently: Apply 1 g topically 2 (two) times daily as needed (yeast). ) 15 g 2 08/28/2018 at Unknown time  . OXYGEN Inhale 3.5 L into the lungs continuous. O2 3.5L at home.   08/28/2018 at Unknown time  . pravastatin (PRAVACHOL) 20 MG tablet TAKE ONE TABLET BY MOUTH EVERY DAY (Patient taking differently: Take 20 mg by mouth every evening. ) 90 tablet 0 08/27/2018 at Unknown time  . Probiotic Product (PROBIOTIC PO) Take by mouth.   08/27/2018 at Unknown time  . torsemide (DEMADEX) 20 MG tablet Take 1 tablet (20 mg total) by mouth daily for 30 days. 30 tablet 3 08/28/2018 at Unknown time  . potassium chloride (K-DUR) 10 MEQ tablet Take 1 tablet (10 mEq total) by mouth daily for 30 days. (Patient not taking: Reported on 08/28/2018) 30 tablet 3 Not Taking at Unknown time    Assessment: Pharmacy consulted to dose apixaban in patient with atrial fibrillation on 4/14. SCr continues to improve - now 2.1. Other labs stable  Goal of Therapy:   Monitor platelets by anticoagulation protocol: Yes   Plan:  1) Continue Apixaban 5 mg twice daily. 2) Monitor labs and s/s of bleeding. 3) Will educate prior to patient being discharged 4) Will sign off consult  Kara Mead 09/02/2018,9:11 AM

## 2018-09-02 NOTE — Progress Notes (Signed)
PT Cancellation Note  Patient Details Name: Renee Noble MRN: [id number] DOB: 02/08/1956   Cancelled Treatment:     PT deferred this am at pt request.  Pt reports N&V overnight and currently too nauseous to attempt movement.  Will follow.   Jania Steinke 09/02/2018, 10:48 AM

## 2018-09-02 NOTE — Progress Notes (Signed)
NAME:  Renee Noble, MRN:  [id number], DOB:  [birth year], LOS: 5 ADMISSION DATE:  08/28/2018, CONSULTATION DATE:  08/29/2018 REFERRING MD:  Dr. Ree Kida, [facility], CHIEF COMPLAINT:  Short of breath   History   63 yo female former smoker presented to [facility] 4/13 with progressive dyspnea x 4 days. #l at home for COPD.  She lives with her cousin who was dx with PNA (reported COVID test negative).  CXR showed changes of pneumonia.  Initial COVID test was negative, but repeat result pending.  Developed progressive hypercapnia with altered mental status and required intubation 4/15.  Past Medical History  Severe COPD, chronic respiratory failure on 3 liters oxygen, diastolic CHF, DM II, Hypothyroidism, OSA, Depression, Chronic pain.   Significant Hospital Events   4/13 Admit 4/15 VDRF  Consults:    Procedures:  ETT 4/15 >> Lt [place] CVL 4/15 >>  COVID Testing:  Procalcitonin 4/14 >> less than 0.10 D dimer 4/14 >> 2.13 CRP 4/14 >> less than 0.8 LDH 4/14 >> 107 Ferritin 4/14 >> 26 Fibrinogen 4/14 >> 330 IL-6 4/15 >> 6.6  Micro Data:  RVP 4/14 >> negative COVID 4/14 >> negative Pneumococcal Ag 4/14 >> negative Legionella Ag 4/14 >> neg COVID 4/15 >> neg Tracheal aspirate 4/15 >  Antimicrobials:  Rocephin 4/15 >> Zithromax 4/15 >>   Interim history/subjective:  Remains on vent: Awake. Weaning well. No complaints.   Objective   Blood pressure (!) 128/54, pulse (!) 103, temperature 97.7 F (36.5 C), temperature source Oral, resp. rate (!) 9, height 5\' 4"  (1.626 m), weight (!) 139.1 kg, SpO2 93 %. CVP:  [20 mmHg-25 mmHg] 25 mmHg      Intake/Output Summary (Last 24 hours) at 09/02/2018 0852 Last data filed at 09/02/2018 0800 Gross per 24 hour  Intake 153.24 ml  Output 2175 ml  Net -2021.76 ml   Filed Weights   08/31/18 0320 09/01/18 0500 09/02/18 0500  Weight: 135.2 kg 135.9 kg (!) 139.1 kg    Examination: General:  Morbidly obese female in NAD on vent Neuro:  RASS 0 HEENT:   [place]/AT, No JVD noted, PERRL Cardiovascular:  Mildly tachy, irregular. A flutter on monitor.  Lungs:  Clear bilateral breath sounds.  Abdomen:  Soft, non-distended, non-tender. LLQ hernia. Non-reduceable. Chronic. Musculoskeletal:  No acute deformity. Lower extremity edema and appearance of venous stasis.  Skin:  Intact, MMM.   Resolved Hospital Problem list     Assessment & Plan:   Acute on chronic hypoxic, hypercapnic respiratory failure Multifactorial.  Resolved. -Continue treat underlying contributors  COPD with acute exacerbation -Scheduled bronchodilators -Recommend transition steroids to prednisone on 4/18, plan to taper over 10 to 14 days  CAP. Discussion: Initial COVID negative, repeat negative as well.  Plan -Transition ceftriaxone to p.o. antibiotic regimen to complete course -Procalcitonin and culture data reassuring, low threshold to discontinue antibiotics  OSA Discussion: Unable to wear her CPAP 4/17 overnight due to nausea, emesis Plan -Retry CPAP as she is able to tolerate.  She has good compliance at home.   Acute metabolic encephalopathy.  Resolved Hx of chronic pain, depression. Plan -Careful with the patient's outpatient MS Contin, Pamelor as potential contributors to respiratory suppression  A fib with RVR. Hx of HTN with chronic diastolic CHF, HLD. Plan - cardizem, eliquis, pravachol per primary  AKI from prerenal azotemia >> baseline creatinine 1.17 from 07/31/18. She is s/p L nephrectopmy Acute urine retention Plan - per primary  DM type II with steroid induced hyperglycemia. Hx of  hypothyroidism. Plan - SSI/Lantus per primary   Best practice:  Diet: Carb modified, heart healthy DVT prophylaxis: eliquis GI prophylaxis: protonix Mobility: Out of bed Code Status: full code Family Communication:  p Hoffman updated caregiver and roommate margaret 4/17 about our hopes for extubation. [name] said she is updating the son. Does not want to  give his contact info. Name is Lisbeth Renshaw. Disposition: ICU to floor  Labs   CBC: Recent Labs  Lab 08/28/18 1824 08/29/18 0412 08/30/18 0320 08/31/18 0330 09/02/18 0111  WBC 5.7 3.4* 7.3 6.2 7.9  NEUTROABS 5.4 3.1  --   --  7.4  HGB 9.2* 9.6* 10.0* 8.8* 9.5*  HCT 34.2* 35.3* 36.6 30.7* 34.2*  MCV 88.1 88.9 88.2 84.8 84.9  PLT 66* 66* 104* 86* 84*    Basic Metabolic Panel: Recent Labs  Lab 08/29/18 0412 08/30/18 0320 08/30/18 2018 08/31/18 0330 08/31/18 1521 09/01/18 0500 09/01/18 0826 09/02/18 0111  NA 139 134* 134* 134* 136  --   --  138  K 4.7 5.2* 5.4* 5.3* 4.9  --   --  4.7  CL 88* 85* 88* 88* 89*  --   --  89*  CO2 38* 38* 35* 34* 34*  --   --  37*  GLUCOSE 171* 207* 176* 163* 210*  --   --  143*  BUN 52* 65* 73* 80* 93*  --   --  111*  CREATININE 1.77* 2.42* 2.73* 2.82* 2.80*  --  2.49* 2.10*  CALCIUM 8.4* 8.1* 7.2* 7.4* 7.5*  --   --  8.1*  MG 2.3  --  2.2 2.2 2.1 2.3  --   --   PHOS 7.0*  --  8.0* 5.8* 6.1* 6.5*  --   --    GFR: Estimated Creatinine Clearance: 37.3 mL/min (A) (by C-G formula based on SCr of 2.1 mg/dL (H)). Recent Labs  Lab 08/29/18 0412 08/30/18 0320 08/31/18 0330 09/01/18 0500 09/02/18 0111  PROCALCITON <0.10 <0.10 <0.10 <0.10  --   WBC 3.4* 7.3 6.2  --  7.9  LATICACIDVEN  --  1.4  --   --   --     Liver Function Tests: Recent Labs  Lab 08/28/18 1824 08/29/18 0412 08/30/18 0320  AST 16 16 17   ALT 10 10 12   ALKPHOS 62 61 60  BILITOT 1.0 1.0 0.5  PROT 7.0 6.9 7.3  ALBUMIN 4.0 3.8 4.0   No results for input(s): LIPASE, AMYLASE in the last 168 hours. No results for input(s): AMMONIA in the last 168 hours.  ABG    Component Value Date/Time   PHART 7.329 (L) 08/30/2018 2319   PCO2ART 71.2 (HH) 08/30/2018 2319   PO2ART 120 (H) 08/30/2018 2319   HCO3 36.5 (H) 08/30/2018 2319   TCO2 36.8 07/15/2015 0425   O2SAT 98.7 08/30/2018 2319     Coagulation Profile: No results for input(s): INR, PROTIME in the last 168  hours.  Cardiac Enzymes: Recent Labs  Lab 08/28/18 1824  TROPONINI 0.03*    HbA1C: Hgb A1c MFr Bld  Date/Time Value Ref Range Status  06/12/2018 04:10 AM 5.6 4.8 - 5.6 % Final    Comment:    (NOTE) Pre diabetes:          5.7%-6.4% Diabetes:              >6.4% Glycemic control for   <7.0% adults with diabetes   04/09/2018 05:39 AM 5.2 4.8 - 5.6 % Final    Comment:    (  NOTE) Pre diabetes:          5.7%-6.4% Diabetes:              >6.4% Glycemic control for   <7.0% adults with diabetes     CBG: Recent Labs  Lab 09/01/18 1122 09/01/18 1545 09/01/18 2019 09/02/18 0325 09/02/18 0759  GLUCAP 181* 150* 159* 146* 149*    PCCM will sign off.  Please call if we can help in any way.   Baltazar Apo, MD, PhD 09/02/2018, 8:57 AM [facility] [phone] or if no answer [phone]

## 2018-09-02 NOTE — Progress Notes (Signed)
The pt has had 3 episode of n/v of 300cc of gastric emesis requiring Zofran mg IV for relief. CPAP placed on hold for now. I will continue to monitor.

## 2018-09-02 NOTE — Progress Notes (Signed)
Pt alert x4 c/o chest pain, EKG completed, states that it might be gas pressure causing her discomfort. Page Dr Deterding to make aware orders received. Pt's Hr is in the 130's EKG shows no new changes of A Flutter.  Lopressor mg given at 2132, pt has also received Xanax 0.25mg , Cardizem 60 mg, Mscotin 15 mg and Mycion. I will continue to monitor.

## 2018-09-03 LAB — BASIC METABOLIC PANEL
Anion gap: 11 (ref 5–15)
BUN: 108 mg/dL — ABNORMAL HIGH (ref 8–23)
CO2: 38 mmol/L — ABNORMAL HIGH (ref 22–32)
Calcium: 8 mg/dL — ABNORMAL LOW (ref 8.9–10.3)
Chloride: 87 mmol/L — ABNORMAL LOW (ref 98–111)
Creatinine, Ser: 1.91 mg/dL — ABNORMAL HIGH (ref 0.44–1.00)
GFR calc Af Amer: 31 mL/min — ABNORMAL LOW (ref >60)
GFR calc non Af Amer: 27 mL/min — ABNORMAL LOW (ref >60)
Glucose, Bld: 163 mg/dL — ABNORMAL HIGH (ref 70–99)
Potassium: 4.5 mmol/L (ref 3.5–5.1)
Sodium: 136 mmol/L (ref 135–145)

## 2018-09-03 LAB — CBC
HCT: 32.6 % — ABNORMAL LOW (ref 36.0–46.0)
Hemoglobin: 9.2 g/dL — ABNORMAL LOW (ref 12.0–15.0)
MCH: 24.3 pg — ABNORMAL LOW (ref 26.0–34.0)
MCHC: 28.2 g/dL — ABNORMAL LOW (ref 30.0–36.0)
MCV: 86.2 fL (ref 80.0–100.0)
Platelets: 68 10*3/uL — ABNORMAL LOW (ref 150–400)
RBC: 3.78 MIL/uL — ABNORMAL LOW (ref 3.87–5.11)
RDW: 17.1 % — ABNORMAL HIGH (ref 11.5–15.5)
WBC: 6.1 10*3/uL (ref 4.0–10.5)
nRBC: 0 % (ref 0.0–0.2)

## 2018-09-03 LAB — GLUCOSE, CAPILLARY
Glucose-Capillary: 162 mg/dL — ABNORMAL HIGH (ref 70–99)
Glucose-Capillary: 157 mg/dL — ABNORMAL HIGH (ref 70–99)
Glucose-Capillary: 165 mg/dL — ABNORMAL HIGH (ref 70–99)
Glucose-Capillary: 191 mg/dL — ABNORMAL HIGH (ref 70–99)

## 2018-09-03 LAB — TROPONIN I: Troponin I: 0.04 ng/mL (ref <0.03)

## 2018-09-03 MED ORDER — POLYETHYLENE GLYCOL 3350 17 G PO PACK: 17.0000 g | PACK | Freq: Every day | ORAL | Status: DC | PRN

## 2018-09-03 MED ORDER — PREDNISONE 20 MG PO TABS
20.0000 mg | ORAL_TABLET | Freq: Every day | ORAL | Status: DC
  Administered 2018-09-03 – 2018-09-06 (×4): 20 mg via ORAL
  Filled 2018-09-03 (×4): qty 1

## 2018-09-03 MED ORDER — PANTOPRAZOLE SODIUM 40 MG PO TBEC
40.0000 mg | DELAYED_RELEASE_TABLET | Freq: Every day | ORAL | Status: DC
  Administered 2018-09-03 – 2018-09-06 (×4): 40 mg via ORAL
  Filled 2018-09-03 (×4): qty 1

## 2018-09-03 MED ORDER — MORPHINE SULFATE (PF) 2 MG/ML IV SOLN
2.0000 mg | Freq: Once | INTRAVENOUS | Status: AC
  Administered 2018-09-03: 2 mg via INTRAVENOUS
  Filled 2018-09-03: qty 1

## 2018-09-03 NOTE — Progress Notes (Signed)
Pt refused cpap due to nausea and vomiting.  RN aware.

## 2018-09-03 NOTE — Progress Notes (Signed)
Called report to [facility], will transport pt on tele, 4 liters nasal cannula.

## 2018-09-03 NOTE — Progress Notes (Signed)
CRITICAL VALUE ALERT  Critical Value:  Troponin 0.04  Date & Time Notied:  09/03/18 2320  Provider Notified: Silas [place], NP  Orders Received/Actions taken: pending

## 2018-09-03 NOTE — Progress Notes (Signed)
PROGRESS NOTE    Renee Noble  UXL:[id number] DOB: [birth year] DOA: 08/28/2018 PCP: [name], PA-C    Brief Narrative:  63 year old female with history of chronic hypoxic respiratory failure on 3 L nasal cannula at home, severe COPD, chronic diastolic heart failure, diabetes mellitus type 2, obesity, presented to [facility] on 08/28/2018 with acute on chronic respiratory failure in the setting of acute on chronic diastolic heart failure and COPD exacerbation.  Patient had 3 to 4 days of progressive shortness of breath and orthopnea along with 3 to 5 pound weight gain in 1 week. She reported that her cousin who lives with her was diagnosed with pneumonia within the last week and is also being tested for COVID 19, negative results. Patient was transferred to [facility] for COVID rule out.  Chest x-ray on admission showed bilateral interstitial/airspace opacities concerning with edema.  COVID 19 was negative.  Because of high suspicion it was recommended to repeat, repeat COVID-19 was negative yet. On 08/30/2018, Patient became hypercapnic with increased work of breathing necessitating intubation and mechanical ventilation.  She had drop in blood pressure, received 1 L of fluid and then treated with low-dose Neo-Synephrine.  Extubated to nasal canula on 09/01/2018.  Retrospectively, most of her symptoms are related to fluid overload from diastolic dysfunction and hypercapnia from obesity and sleep apnea.   Assessment & Plan:   Principal Problem:   Acute on chronic respiratory failure with hypoxia (HCC) Active Problems:   Type II diabetes mellitus with renal manifestations (HCC)   Chronic pain syndrome   Acute on chronic diastolic CHF (congestive heart failure) (HCC)   Acute on chronic congestive heart failure (HCC)   Hypothyroidism   Acute exacerbation of chronic obstructive pulmonary disease (COPD) (HCC)   Atrial flutter with rapid ventricular response (HCC)   AKI  (acute kidney injury) ([place])   Pressure ulcer  Acute on chronic hypoxemic and hypercapnic respiratory failure secondary to COPD exacerbation: COVID-19 ruled out.   Treated with IV steroids and bronchodilator therapy with good clinical improvement.   Change to oral steroids with plan to taper in next few days.   Isolation precautions discontinued.   Advised strict CPAP use at night.   Patient was treated with Rocephin and azithromycin, will continue Augmentin for 3 more days.    Acute on chronic diastolic heart failure.  Recent echocardiogram with normal ejection fraction. Patient is 30 pound overweight from her baseline weight of 273 pound. Renal functions fluctuating.  Patient is currently on Lasix infusion 4 mg/h, she made more than 3 L of urine last night.  Renal functions improving. Continue Lasix infusion. Continue monitoring intake and output.  Daily weight.  Paroxysmal A. Fib: Patient with history of A. fib.  Went into RVR with respiratory compromise.  Now controlled with oral Cardizem.  Will continue.  Started on Eliquis.  Patient is agreeable to use Eliquis.  She will need prescription on discharge. Currently on Cardizem 60 mg every 6 hours, will change to long-acting Cardizem tomorrow morning if remains stable.  Acute renal failure with history of chronic kidney disease stage III: Baseline creatinine about 1.1-1.17.  She had hypotensive episodes, rapid A. Fib. Continue to monitor intake and output, close monitoring of renal functions. Creatinine continues to improve.     Hyperkalemia:  given a dose of Kayexalate .  Potassium normalized.  Continue to monitor.  Type 2 diabetes: Fairly controlled on Lantus and sliding scale insulin.  Was put on increasing dose  of long-acting insulin to cover for IV steroids.  Her requirement may come down with improvement in tapering off of steroids.  Hypothyroidism: Clinically euthyroid on current regimen.  Continue.  Chronic pain syndrome:   On chronic opiates.  Will use less and less opiates as possible.  She is on half the dose he uses at home.   DVT prophylaxis: Eliquis Code Status: Full code Family Communication: cousin. Disposition Plan: Telemetry.  Advance activities, ambulate.  Continue Lasix drip.  Transfer to telemetry unit.  Anticipate discharge home next 1 to 2 days.  Consultants:   PCCM, signed off  Procedures:   ETT, 08/30/2018- 09/01/2018  Left subclavian central line, 08/30/2018   Antimicrobials:   Rocephin and azithromycin IV, 08/30/2018- 09/02/2018  Augmentin 09/02/2018-   Subjective: Patient was seen and examined.  Overnight she had some nausea after eating.  No bowel movement since admission to the hospital for last 5 days.  Passing gas.   She got nauseous so did not wear CPAP last night.   Heart rate is better controlled now.  She is currently on 3 L of oxygen.    Objective: Vitals:   09/03/18 0600 09/03/18 0700 09/03/18 0800 09/03/18 0900  BP: (!) 149/69 (!) 140/58 (!) 139/48   Pulse: 77 77 79   Resp: '13 12 18   '$ Temp: 97.7 F (36.5 C) 97.7 F (36.5 C) 97.7 F (36.5 C) 98 F (36.7 C)  TempSrc:  Bladder  Oral  SpO2: 94% 95% 91%   Weight:      Height:        Intake/Output Summary (Last 24 hours) at 09/03/2018 0912 Last data filed at 09/03/2018 0800 Gross per 24 hour  Intake 334.37 ml  Output 3500 ml  Net -3165.63 ml   Filed Weights   09/01/18 0500 09/02/18 0500 09/03/18 0500  Weight: 135.9 kg (!) 139.1 kg (!) 138.9 kg    Examination:  General exam: Appears calm and comfortable.  On 3 L.  Comfortable.  Respiratory system: Clear to auscultation. Respiratory effort normal. Cardiovascular system: S1 & S2 heard, irregularly irregular. No JVD, murmurs, rubs, gallops or clicks.  Chronic venous stasis changes with pigmentation both legs.  Non-pitting edema. Gastrointestinal system: Abdomen is nondistended, soft and nontender. No organomegaly or masses felt. Normal bowel sounds  heard.obese and pendulous. Central nervous system: Alert and awake. No focal neurological deficits. Extremities: Symmetric 5 x 5 power. Skin: No rashes, lesions or ulcers Psychiatry: Judgement and insight appear normal. Mood & affect appropriate.     Data Reviewed: I have personally reviewed following labs and imaging studies  CBC: Recent Labs  Lab 08/28/18 1824 08/29/18 0412 08/30/18 0320 08/31/18 0330 09/02/18 0111  WBC 5.7 3.4* 7.3 6.2 7.9  NEUTROABS 5.4 3.1  --   --  7.4  HGB 9.2* 9.6* 10.0* 8.8* 9.5*  HCT 34.2* 35.3* 36.6 30.7* 34.2*  MCV 88.1 88.9 88.2 84.8 84.9  PLT 66* 66* 104* 86* 84*   Basic Metabolic Panel: Recent Labs  Lab 08/29/18 0412  08/30/18 2018 08/31/18 0330 08/31/18 1521 09/01/18 0500 09/01/18 0826 09/02/18 0111 09/03/18 0710  NA 139   < > 134* 134* 136  --   --  138 136  K 4.7   < > 5.4* 5.3* 4.9  --   --  4.7 4.5  CL 88*   < > 88* 88* 89*  --   --  89* 87*  CO2 38*   < > 35* 34* 34*  --   --  37* 38*  GLUCOSE 171*   < > 176* 163* 210*  --   --  143* 163*  BUN 52*   < > 73* 80* 93*  --   --  111* 108*  CREATININE 1.77*   < > 2.73* 2.82* 2.80*  --  2.49* 2.10* 1.91*  CALCIUM 8.4*   < > 7.2* 7.4* 7.5*  --   --  8.1* 8.0*  MG 2.3  --  2.2 2.2 2.1 2.3  --   --   --   PHOS 7.0*  --  8.0* 5.8* 6.1* 6.5*  --   --   --    < > = values in this interval not displayed.   GFR: Estimated Creatinine Clearance: 41 mL/min (A) (by C-G formula based on SCr of 1.91 mg/dL (H)). Liver Function Tests: Recent Labs  Lab 08/28/18 1824 08/29/18 0412 08/30/18 0320  AST '16 16 17  '$ ALT '10 10 12  '$ ALKPHOS 62 61 60  BILITOT 1.0 1.0 0.5  PROT 7.0 6.9 7.3  ALBUMIN 4.0 3.8 4.0   No results for input(s): LIPASE, AMYLASE in the last 168 hours. No results for input(s): AMMONIA in the last 168 hours. Coagulation Profile: No results for input(s): INR, PROTIME in the last 168 hours. Cardiac Enzymes: Recent Labs  Lab 08/28/18 1824  TROPONINI 0.03*   BNP (last 3  results) No results for input(s): PROBNP in the last 8760 hours. HbA1C: No results for input(s): HGBA1C in the last 72 hours. CBG: Recent Labs  Lab 09/02/18 0759 09/02/18 1133 09/02/18 1531 09/02/18 2129 09/02/18 2152  GLUCAP 149* 137* 155* 143* 145*   Lipid Profile: No results for input(s): CHOL, HDL, LDLCALC, TRIG, CHOLHDL, LDLDIRECT in the last 72 hours. Thyroid Function Tests: No results for input(s): TSH, T4TOTAL, FREET4, T3FREE, THYROIDAB in the last 72 hours. Anemia Panel: No results for input(s): VITAMINB12, FOLATE, FERRITIN, TIBC, IRON, RETICCTPCT in the last 72 hours. Sepsis Labs: Recent Labs  Lab 08/29/18 0412 08/30/18 0320 08/31/18 0330 09/01/18 0500  PROCALCITON <0.10 <0.10 <0.10 <0.10  LATICACIDVEN  --  1.4  --   --     Recent Results (from the past 240 hour(s))  MRSA PCR Screening     Status: None   Collection Time: 08/28/18  9:58 PM  Result Value Ref Range Status   MRSA by PCR NEGATIVE NEGATIVE Final    Comment:        The GeneXpert MRSA Assay (FDA approved for NASAL specimens only), is one component of a comprehensive MRSA colonization surveillance program. It is not intended to diagnose MRSA infection nor to guide or monitor treatment for MRSA infections. Performed at [facility], [address], [place], [place] [address]   Respiratory Panel by PCR     Status: None   Collection Time: 08/29/18  7:27 AM  Result Value Ref Range Status   Adenovirus NOT DETECTED NOT DETECTED Final   Coronavirus 229E NOT DETECTED NOT DETECTED Final    Comment: (NOTE) The Coronavirus on the Respiratory Panel, DOES NOT test for the novel  Coronavirus (2019 nCoV)    Coronavirus HKU1 NOT DETECTED NOT DETECTED Final   Coronavirus NL63 NOT DETECTED NOT DETECTED Final   Coronavirus OC43 NOT DETECTED NOT DETECTED Final   Metapneumovirus NOT DETECTED NOT DETECTED Final   Rhinovirus / Enterovirus NOT DETECTED NOT DETECTED Final   Influenza A NOT DETECTED NOT DETECTED  Final   Influenza B NOT DETECTED NOT DETECTED Final   Parainfluenza Virus 1  NOT DETECTED NOT DETECTED Final   Parainfluenza Virus 2 NOT DETECTED NOT DETECTED Final   Parainfluenza Virus 3 NOT DETECTED NOT DETECTED Final   Parainfluenza Virus 4 NOT DETECTED NOT DETECTED Final   Respiratory Syncytial Virus NOT DETECTED NOT DETECTED Final   Bordetella pertussis NOT DETECTED NOT DETECTED Final   Chlamydophila pneumoniae NOT DETECTED NOT DETECTED Final   Mycoplasma pneumoniae NOT DETECTED NOT DETECTED Final    Comment: Performed at [facility], [place] [address], [place], [place] [address]  SARS Coronavirus 2 [facility] order, Performed in [facility] hospital lab)     Status: None   Collection Time: 08/29/18  7:27 AM  Result Value Ref Range Status   SARS Coronavirus 2 NEGATIVE NEGATIVE Final    Comment: (NOTE) If result is NEGATIVE SARS-CoV-2 target nucleic acids are NOT DETECTED. The SARS-CoV-2 RNA is generally detectable in upper and lower  respiratory specimens during the acute phase of infection. The lowest  concentration of SARS-CoV-2 viral copies this assay can detect is 250  copies / mL. A negative result does not preclude SARS-CoV-2 infection  and should not be used as the sole basis for treatment or other  patient management decisions.  A negative result may occur with  improper specimen collection / handling, submission of specimen other  than nasopharyngeal swab, presence of viral mutation(s) within the  areas targeted by this assay, and inadequate number of viral copies  (<250 copies / mL). A negative result must be combined with clinical  observations, patient history, and epidemiological information. If result is POSITIVE SARS-CoV-2 target nucleic acids are DETECTED. The SARS-CoV-2 RNA is generally detectable in upper and lower  respiratory specimens dur ing the acute phase of infection.  Positive  results are indicative of active infection with SARS-CoV-2.  Clinical   correlation with patient history and other diagnostic information is  necessary to determine patient infection status.  Positive results do  not rule out bacterial infection or co-infection with other viruses. If result is PRESUMPTIVE POSTIVE SARS-CoV-2 nucleic acids MAY BE PRESENT.   A presumptive positive result was obtained on the submitted specimen  and confirmed on repeat testing.  While 2019 novel coronavirus  (SARS-CoV-2) nucleic acids may be present in the submitted sample  additional confirmatory testing may be necessary for epidemiological  and / or clinical management purposes  to differentiate between  SARS-CoV-2 and other Sarbecovirus currently known to infect humans.  If clinically indicated additional testing with an alternate test  methodology [phone]) is advised. The SARS-CoV-2 RNA is generally  detectable in upper and lower respiratory sp ecimens during the acute  phase of infection. The expected result is Negative. Fact Sheet for Patients:  StrictlyIdeas.no Fact Sheet for Healthcare Providers: BankingDealers.co.za This test is not yet approved or cleared by the [place] FDA and has been authorized for detection and/or diagnosis of SARS-CoV-2 by FDA under an Emergency Use Authorization (EUA).  This EUA will remain in effect (meaning this test can be used) for the duration of the COVID-19 declaration under Section 564(b)(1) of the Act, 21 U.S.C. section 360bbb-3(b)(1), unless the authorization is terminated or revoked sooner. Performed at [facility], [place] [address] [place], [place] [address]   Novel Coronavirus, NAA (hospital order; send-out to ref lab)     Status: None   Collection Time: 08/30/18 12:05 PM  Result Value Ref Range Status   SARS-CoV-2, NAA NOT DETECTED NOT DETECTED Final    Comment: (NOTE) This test was  developed and its performance characteristics determined by [employer]. This  test has not been FDA cleared or approved. This test has been authorized by FDA under an Emergency Use Authorization (EUA). This test has been validated in accordance with the FDA's Guidance Document (Policy for [place] in Laboratories Certified to Perform High Complexity Testing under CLIA prior to Emergency Use Authorization for Coronavirus [id number] during the [facility] Emergency) issued on February 29th, 2020. FDA independent review of this validation is pending. This test is only authorized for the duration of time the declaration that circumstances exist justifying the authorization of the emergency use of in vitro diagnostic tests for detection of SARS-CoV- 2 virus and/or diagnosis of COVID-19 infection under section 564(b)(1) of the Act, 21 U.S.C. 883GXE-1(P)(9), unless the authorization is terminated or revoked sooner. Performed At: [facility] [place], [place] [id number] Rush Farmer MD [id number]    [place]  Final    Comment: Performed at [facility] [address] [place], [place] [address]  Culture, respiratory (non-expectorated)     Status: None   Collection Time: 08/30/18  7:47 PM  Result Value Ref Range Status   Specimen Description   Final    ENDOTRACHEAL Performed at [facility] [address] [place], [place] [address]    Special Requests   Final    NONE Performed at [facility], [place] [address] [place], [place] [address]    Gram Stain   Final    ABUNDANT WBC PRESENT,BOTH PMN AND MONONUCLEAR ABUNDANT GRAM POSITIVE RODS    Culture   Final    Consistent with normal respiratory flora. Performed at [facility], [place] [address] [place], [place] [address]    Report Status 09/01/2018 FINAL  Final         Radiology Studies: No results found.      Scheduled Meds: . amoxicillin-clavulanate  1 tablet Oral Q12H   . apixaban  5 mg Oral BID  . chlorhexidine gluconate (MEDLINE KIT)  15 mL Mouth Rinse BID  . Chlorhexidine Gluconate Cloth  6 each Topical Daily  . diltiazem  60 mg Oral Q6H  . fentaNYL (SUBLIMAZE) injection  100 mcg Intravenous Once  . insulin aspart  0-20 Units Subcutaneous TID WC  . insulin aspart  0-5 Units Subcutaneous QHS  . insulin glargine  15 Units Subcutaneous BID  . ipratropium-albuterol  3 mL Nebulization Q6H  . levothyroxine  88 mcg Oral QAC breakfast  . midazolam  2 mg Intravenous Once  . morphine  15 mg Oral Q12H  . nystatin   Topical TID  . pantoprazole  40 mg Oral Daily  . pravastatin  20 mg Oral QPM  . [START ON 09/04/2018] predniSONE  20 mg Oral Q breakfast  . senna-docusate  1 tablet Oral Daily  . sodium chloride flush  10-40 mL Intracatheter Q12H   Continuous Infusions: . furosemide (LASIX) infusion 4 mg/hr (09/03/18 0800)     LOS: 6 days    Time spent: 25 minutes    Barb Merino, MD [facility] Pager [phone]  If 7PM-7AM, please contact night-coverage [email or web address] Password [email or web address] 09/03/2018, 9:12 AM

## 2018-09-03 NOTE — Progress Notes (Signed)
OT Cancellation Note  Patient Details Name: Renee Noble MRN: [id number] DOB: [birth year]   Cancelled Treatment:    Reason Eval/Treat Not Completed: Other (comment)  Pt transferring rooms- will check on pt later in day or next day  Kari Baars, [place] [phone] Office- [phone], Edwena Felty D 09/03/2018, 12:20 PM

## 2018-09-04 ENCOUNTER — Inpatient Hospital Stay (HOSPITAL_COMMUNITY): Payer: Medicare Other

## 2018-09-04 LAB — GLUCOSE, CAPILLARY
Glucose-Capillary: 171 mg/dL — ABNORMAL HIGH (ref 70–99)
Glucose-Capillary: 168 mg/dL — ABNORMAL HIGH (ref 70–99)
Glucose-Capillary: 175 mg/dL — ABNORMAL HIGH (ref 70–99)
Glucose-Capillary: 197 mg/dL — ABNORMAL HIGH (ref 70–99)

## 2018-09-04 LAB — CBC
HCT: 31.5 % — ABNORMAL LOW (ref 36.0–46.0)
Hemoglobin: 8.9 g/dL — ABNORMAL LOW (ref 12.0–15.0)
MCH: 24.3 pg — ABNORMAL LOW (ref 26.0–34.0)
MCHC: 28.3 g/dL — ABNORMAL LOW (ref 30.0–36.0)
MCV: 85.8 fL (ref 80.0–100.0)
Platelets: 63 10*3/uL — ABNORMAL LOW (ref 150–400)
RBC: 3.67 MIL/uL — ABNORMAL LOW (ref 3.87–5.11)
RDW: 17.1 % — ABNORMAL HIGH (ref 11.5–15.5)
WBC: 5.4 10*3/uL (ref 4.0–10.5)
nRBC: 0 % (ref 0.0–0.2)

## 2018-09-04 LAB — BASIC METABOLIC PANEL
Anion gap: 11 (ref 5–15)
BUN: 90 mg/dL — ABNORMAL HIGH (ref 8–23)
CO2: 38 mmol/L — ABNORMAL HIGH (ref 22–32)
Calcium: 7.9 mg/dL — ABNORMAL LOW (ref 8.9–10.3)
Chloride: 87 mmol/L — ABNORMAL LOW (ref 98–111)
Creatinine, Ser: 1.93 mg/dL — ABNORMAL HIGH (ref 0.44–1.00)
GFR calc Af Amer: 31 mL/min — ABNORMAL LOW (ref >60)
GFR calc non Af Amer: 27 mL/min — ABNORMAL LOW (ref >60)
Glucose, Bld: 185 mg/dL — ABNORMAL HIGH (ref 70–99)
Potassium: 4.4 mmol/L (ref 3.5–5.1)
Sodium: 136 mmol/L (ref 135–145)

## 2018-09-04 MED ORDER — IPRATROPIUM-ALBUTEROL 0.5-2.5 (3) MG/3ML IN SOLN
3.0000 mL | Freq: Four times a day (QID) | RESPIRATORY_TRACT | Status: DC
  Administered 2018-09-05 (×3): 3 mL via RESPIRATORY_TRACT
  Filled 2018-09-04 (×3): qty 3

## 2018-09-04 MED ORDER — FUROSEMIDE 10 MG/ML IJ SOLN
40.0000 mg | Freq: Once | INTRAMUSCULAR | Status: AC
  Administered 2018-09-04: 40 mg via INTRAVENOUS
  Filled 2018-09-04: qty 4

## 2018-09-04 NOTE — Progress Notes (Signed)
OT Cancellation Note  Patient Details Name: ELEN ACERO MRN: [id number] DOB: 1955-11-02   Cancelled Treatment:    Reason Eval/Treat Not Completed: Other (comment)  Pt sleeping in chair.  Nursing plans to lift her back to bed.  Spoke with PT who shared pt will SNF as pt needed significant A this day.   Will check on pt next day for OT.  Kari Baars, OT Acute Rehabilitation Services [phone] Office- [phone], Mickel Baas, [place] Acute Rehabilitation Services [phone] [phone]    09/04/2018, 3:33 PM

## 2018-09-04 NOTE — Progress Notes (Signed)
PROGRESS NOTE    Renee Noble  EKC:[id number] DOB: [birth year] DOA: 08/28/2018 PCP: [name], PA-C   Brief Narrative: Renee Noble is a 63 y.o. female with history of chronic hypoxic respiratory failure on 3 L nasal cannula at home, severe COPD, chronic diastolic heart failure, diabetes mellitus type 2, obesity, presented to [facility] on 08/28/2018 with acute on chronic respiratory failure in the setting of acute on chronic diastolic heart failure and COPD exacerbation. Patient had 3 to 4 days of progressive shortness of breath and orthopnea along with 3 to 5 pound weight gain in 1 week. She reported that her cousin who lives with her was diagnosed with pneumonia within the last week and is also being tested for COVID 19, negative results. Patient was transferred to [facility] for COVID rule out.  Chest x-ray on admission showed bilateral interstitial/airspace opacities concerning with edema.COVID 19 was negative.  Because of high suspicion it was recommended to repeat, repeat COVID-19 was negative yet. On 08/30/2018, Patient became hypercapnic with increased work of breathing necessitating intubation and mechanical ventilation.  She had drop in blood pressure, received 1 L of fluid and then treated with low-dose Neo-Synephrine.  Extubated to nasal canula on 09/01/2018.  Retrospectively, most of her symptoms are related to fluid overload from diastolic dysfunction and hypercapnia from obesity and sleep apnea.   Assessment & Plan:   Principal Problem:   Acute on chronic respiratory failure with hypoxia (HCC) Active Problems:   Type II diabetes mellitus with renal manifestations (HCC)   Chronic pain syndrome   Acute on chronic diastolic CHF (congestive heart failure) (HCC)   Acute on chronic congestive heart failure (HCC)   Hypothyroidism   Acute exacerbation of chronic obstructive pulmonary disease (COPD) (HCC)   Atrial flutter with rapid ventricular  response (HCC)   AKI (acute kidney injury) ([place])   Pressure ulcer   Acute on chronic respiratory failure with hypoxia and hypercapnia COPD exacerbation COVID-19 ruled out. Was intubated from 4/15 to 4/17. Initially treated with Ceftriaxone/azithromycin and currently on Augmentin. Uses 3 L O2 via [place] at baseline -Continue steroids, bronchodilators -CPAP qhs  Acute on chronic diastolic heart failure Transthoracic Echocardiogram from 03/2018 significant for an EF of 60-65% with previous Transthoracic Echocardiogram significant for grade 1 diastolic dysfunction. Per chart review, patient presented 30 lbs overweight. Started on a Lasix drip at 4 mg/hr. Foley placed. Dry weight likely closer to recent baselines of 272-276 lbs. Currently 315 lbs -Daily BMP -Continue Lasix drip; give Lasix IV 40 mg x1  Paroxysmal atrial fibrillation Rate controlled. -Continue Cardizem and Eliquis  Acute kidney injury on CKD stage III Baseline creatinine of 1.2. Current creatinine of 1.2 with a peak of 2.82. In setting of heart failure. Associated significantly elevated BUN. No obvious signs of uremia. -Management per above  Hyperkalemia In setting of AKI. Given Kayexalate. Potassium normal.  Diabetes mellitus, type 2 -Continue Lantus and SSI  Hypothyroidism -Continue Synthroid  Chronic pain -Continue morphine  Pressure injury Left/right ischial tuberosities, present on admission   DVT prophylaxis: Eliquis Code Status:   Code Status: Full Code Family Communication: None at bedside Disposition Plan: Discharge to SNF once off of IV lasix   Consultants:   PCCM  Procedures:   ETT  Antimicrobials:  Ceftriaxone (4/15>>4/17)  Azithromycin (4/15>>4/17)   Augmentin (4/18>>   Subjective: No issues overnight.  Objective: Vitals:   09/03/18 2022 09/04/18 0500 09/04/18 0554 09/04/18 0805  BP: 121/64  (!) 148/95  Pulse: 77  (!) 50   Resp: (!) 22  (!) 21   Temp: 97.6 F (36.4 C)  97.6  F (36.4 C)   TempSrc: Oral  Oral   SpO2: 95%  91% 95%  Weight:  (!) 143 kg    Height:        Intake/Output Summary (Last 24 hours) at 09/04/2018 1314 Last data filed at 09/04/2018 0535 Gross per 24 hour  Intake 631.42 ml  Output 1450 ml  Net -818.58 ml   Filed Weights   09/02/18 0500 09/03/18 0500 09/04/18 0500  Weight: (!) 139.1 kg (!) 138.9 kg (!) 143 kg    Examination:  General exam: Appears calm and comfortable Respiratory system: Clear to auscultation. Respiratory effort normal. Cardiovascular system: S1 & S2 heard, RRR. No murmurs, rubs, gallops or clicks. Gastrointestinal system: Abdomen is significantly obese with edema, soft and nontender. Normal bowel sounds heard. Central nervous system: Alert and oriented. No focal neurological deficits. Extremities: No edema. No calf tenderness Skin: No cyanosis. No rashes Psychiatry: Judgement and insight appear normal. Mood & affect appropriate.     Data Reviewed: I have personally reviewed following labs and imaging studies  CBC: Recent Labs  Lab 08/28/18 1824 08/29/18 0412 08/30/18 0320 08/31/18 0330 09/02/18 0111 09/03/18 0710 09/04/18 0334  WBC 5.7 3.4* 7.3 6.2 7.9 6.1 5.4  NEUTROABS 5.4 3.1  --   --  7.4  --   --   HGB 9.2* 9.6* 10.0* 8.8* 9.5* 9.2* 8.9*  HCT 34.2* 35.3* 36.6 30.7* 34.2* 32.6* 31.5*  MCV 88.1 88.9 88.2 84.8 84.9 86.2 85.8  PLT 66* 66* 104* 86* 84* 68* 63*   Basic Metabolic Panel: Recent Labs  Lab 08/29/18 0412  08/30/18 2018 08/31/18 0330 08/31/18 1521 09/01/18 0500 09/01/18 0826 09/02/18 0111 09/03/18 0710 09/04/18 0334  NA 139   < > 134* 134* 136  --   --  138 136 136  K 4.7   < > 5.4* 5.3* 4.9  --   --  4.7 4.5 4.4  CL 88*   < > 88* 88* 89*  --   --  89* 87* 87*  CO2 38*   < > 35* 34* 34*  --   --  37* 38* 38*  GLUCOSE 171*   < > 176* 163* 210*  --   --  143* 163* 185*  BUN 52*   < > 73* 80* 93*  --   --  111* 108* 90*  CREATININE 1.77*   < > 2.73* 2.82* 2.80*  --  2.49*  2.10* 1.91* 1.93*  CALCIUM 8.4*   < > 7.2* 7.4* 7.5*  --   --  8.1* 8.0* 7.9*  MG 2.3  --  2.2 2.2 2.1 2.3  --   --   --   --   PHOS 7.0*  --  8.0* 5.8* 6.1* 6.5*  --   --   --   --    < > = values in this interval not displayed.   GFR: Estimated Creatinine Clearance: 41.3 mL/min (A) (by C-G formula based on SCr of 1.93 mg/dL (H)). Liver Function Tests: Recent Labs  Lab 08/28/18 1824 08/29/18 0412 08/30/18 0320  AST _0 ALT _1 ALKPHOS 62 61 60  BILITOT 1.0 1.0 0.5  PROT 7.0 6.9 7.3  ALBUMIN 4.0 3.8 4.0   No results for input(s): LIPASE, AMYLASE in the last 168 hours. No results for input(s): AMMONIA in the  last 168 hours. Coagulation Profile: No results for input(s): INR, PROTIME in the last 168 hours. Cardiac Enzymes: Recent Labs  Lab 08/28/18 1824 09/03/18 2230  TROPONINI 0.03* 0.04*   BNP (last 3 results) No results for input(s): PROBNP in the last 8760 hours. HbA1C: No results for input(s): HGBA1C in the last 72 hours. CBG: Recent Labs  Lab 09/03/18 1138 09/03/18 1625 09/03/18 2050 09/04/18 0744 09/04/18 1118  GLUCAP 157* 165* 191* 171* 168*   Lipid Profile: No results for input(s): CHOL, HDL, LDLCALC, TRIG, CHOLHDL, LDLDIRECT in the last 72 hours. Thyroid Function Tests: No results for input(s): TSH, T4TOTAL, FREET4, T3FREE, THYROIDAB in the last 72 hours. Anemia Panel: No results for input(s): VITAMINB12, FOLATE, FERRITIN, TIBC, IRON, RETICCTPCT in the last 72 hours. Sepsis Labs: Recent Labs  Lab 08/29/18 0412 08/30/18 0320 08/31/18 0330 09/01/18 0500  PROCALCITON <0.10 <0.10 <0.10 <0.10  LATICACIDVEN  --  1.4  --   --     Recent Results (from the past 240 hour(s))  MRSA PCR Screening     Status: None   Collection Time: 08/28/18  9:58 PM  Result Value Ref Range Status   MRSA by PCR NEGATIVE NEGATIVE Final    Comment:        The GeneXpert MRSA Assay (FDA approved for NASAL specimens only), is one component of a comprehensive  MRSA colonization surveillance program. It is not intended to diagnose MRSA infection nor to guide or monitor treatment for MRSA infections. Performed at [facility], [address] [place], [place] [address]   Respiratory Panel by PCR     Status: None   Collection Time: 08/29/18  7:27 AM  Result Value Ref Range Status   Adenovirus NOT DETECTED NOT DETECTED Final   Coronavirus 229E NOT DETECTED NOT DETECTED Final    Comment: (NOTE) The Coronavirus on the Respiratory Panel, DOES NOT test for the novel  Coronavirus (2019 nCoV)    Coronavirus HKU1 NOT DETECTED NOT DETECTED Final   Coronavirus NL63 NOT DETECTED NOT DETECTED Final   Coronavirus OC43 NOT DETECTED NOT DETECTED Final   Metapneumovirus NOT DETECTED NOT DETECTED Final   Rhinovirus / Enterovirus NOT DETECTED NOT DETECTED Final   Influenza A NOT DETECTED NOT DETECTED Final   Influenza B NOT DETECTED NOT DETECTED Final   Parainfluenza Virus 1 NOT DETECTED NOT DETECTED Final   Parainfluenza Virus 2 NOT DETECTED NOT DETECTED Final   Parainfluenza Virus 3 NOT DETECTED NOT DETECTED Final   Parainfluenza Virus 4 NOT DETECTED NOT DETECTED Final   Respiratory Syncytial Virus NOT DETECTED NOT DETECTED Final   Bordetella pertussis NOT DETECTED NOT DETECTED Final   Chlamydophila pneumoniae NOT DETECTED NOT DETECTED Final   Mycoplasma pneumoniae NOT DETECTED NOT DETECTED Final    Comment: Performed at [facility], [place] [address], [place], [place] [address]  SARS Coronavirus 2 [facility] order, Performed in [facility] hospital lab)     Status: None   Collection Time: 08/29/18  7:27 AM  Result Value Ref Range Status   SARS Coronavirus 2 NEGATIVE NEGATIVE Final    Comment: (NOTE) If result is NEGATIVE SARS-CoV-2 target nucleic acids are NOT DETECTED. The SARS-CoV-2 RNA is generally detectable in upper and lower  respiratory specimens during the acute phase of infection. The lowest  concentration of SARS-CoV-2 viral copies this  assay can detect is 250  copies / mL. A negative result does not preclude SARS-CoV-2 infection  and should not be used as the sole basis  for treatment or other  patient management decisions.  A negative result may occur with  improper specimen collection / handling, submission of specimen other  than nasopharyngeal swab, presence of viral mutation(s) within the  areas targeted by this assay, and inadequate number of viral copies  (<250 copies / mL). A negative result must be combined with clinical  observations, patient history, and epidemiological information. If result is POSITIVE SARS-CoV-2 target nucleic acids are DETECTED. The SARS-CoV-2 RNA is generally detectable in upper and lower  respiratory specimens dur ing the acute phase of infection.  Positive  results are indicative of active infection with SARS-CoV-2.  Clinical  correlation with patient history and other diagnostic information is  necessary to determine patient infection status.  Positive results do  not rule out bacterial infection or co-infection with other viruses. If result is PRESUMPTIVE POSTIVE SARS-CoV-2 nucleic acids MAY BE PRESENT.   A presumptive positive result was obtained on the submitted specimen  and confirmed on repeat testing.  While 2019 novel coronavirus  (SARS-CoV-2) nucleic acids may be present in the submitted sample  additional confirmatory testing may be necessary for epidemiological  and / or clinical management purposes  to differentiate between  SARS-CoV-2 and other Sarbecovirus currently known to infect humans.  If clinically indicated additional testing with an alternate test  methodology [phone]) is advised. The SARS-CoV-2 RNA is generally  detectable in upper and lower respiratory sp ecimens during the acute  phase of infection. The expected result is Negative. Fact Sheet for Patients:  StrictlyIdeas.no Fact Sheet for Healthcare Providers:  BankingDealers.co.za This test is not yet approved or cleared by the [place] FDA and has been authorized for detection and/or diagnosis of SARS-CoV-2 by FDA under an Emergency Use Authorization (EUA).  This EUA will remain in effect (meaning this test can be used) for the duration of the COVID-19 declaration under Section 564(b)(1) of the Act, 21 U.S.C. section 360bbb-3(b)(1), unless the authorization is terminated or revoked sooner. Performed at [facility], [place] [address], [place], [place] [address]   Novel Coronavirus, NAA (hospital order; send-out to ref lab)     Status: None   Collection Time: 08/30/18 12:05 PM  Result Value Ref Range Status   SARS-CoV-2, NAA NOT DETECTED NOT DETECTED Final    Comment: (NOTE) This test was developed and its performance characteristics determined by [employer]. This test has not been FDA cleared or approved. This test has been authorized by FDA under an Emergency Use Authorization (EUA). This test has been validated in accordance with the FDA's Guidance Document (Policy for [place] in Laboratories Certified to Perform High Complexity Testing under CLIA prior to Emergency Use Authorization for Coronavirus [id number] during the [facility] Emergency) issued on February 29th, 2020. FDA independent review of this validation is pending. This test is only authorized for the duration of time the declaration that circumstances exist justifying the authorization of the emergency use of in vitro diagnostic tests for detection of SARS-CoV- 2 virus and/or diagnosis of COVID-19 infection under section 564(b)(1) of the Act, 21 U.S.C. 160VPX-1(G)(6), unless the authorization is terminated or revoked sooner. Performed At: [facility] [place], [place] [id number] Rush Farmer MD VO:[phone]    [place]  Final    Comment: Performed at [place] [address] [place],  [address]  Culture, respiratory (non-expectorated)     Status: None   Collection Time: 08/30/18  7:47 PM  Result  Value Ref Range Status   Specimen Description   Final    ENDOTRACHEAL Performed at [place] [address] [place], [place] [address]    Special Requests   Final    NONE Performed at [facility], [place] [address] [place], [place] [address]    Gram Stain   Final    ABUNDANT WBC PRESENT,BOTH PMN AND MONONUCLEAR ABUNDANT GRAM POSITIVE RODS    Culture   Final    Consistent with normal respiratory flora. Performed at [facility], [place] [address], [place], [place] [address]    Report Status 09/01/2018 FINAL  Final         Radiology Studies: US Renal  Result Date: 09/04/2018 CLINICAL DATA:  Acute kidney injury. History of left nephrectomy, chronic kidney disease stage 3 and bladder tack procedure. EXAM: RENAL / URINARY TRACT ULTRASOUND COMPLETE COMPARISON:  06/21/2016 renal sonogram. FINDINGS: Right Kidney: Renal measurements: 16.7 x 6.7 x 5.2 cm = volume: 302 mL. Compensatory hypertrophy of the right kidney. Renal parenchymal thickness and echogenicity within normal limits. No hydronephrosis. No renal mass. Left Kidney: Surgically absent. No mass or fluid collection demonstrated in the left nephrectomy bed. Bladder: Limited visualization of the nondistended grossly normal bladder. IMPRESSION: 1. Compensatory hypertrophy of the otherwise normal right kidney. No right hydronephrosis. 2. Left nephrectomy. 3. Limited visualization of the nondistended normal appearing bladder. Electronically Signed   By: Ilona Sorrel M.D.   On: 09/04/2018 08:37        Scheduled Meds: . amoxicillin-clavulanate  1 tablet Oral Q12H  . apixaban  5 mg Oral BID  . chlorhexidine gluconate (MEDLINE KIT)  15 mL Mouth Rinse BID  . Chlorhexidine Gluconate Cloth  6 each Topical Daily  . diltiazem  60  mg Oral Q6H  . fentaNYL (SUBLIMAZE) injection  100 mcg Intravenous Once  . insulin aspart  0-20 Units Subcutaneous TID WC  . insulin aspart  0-5 Units Subcutaneous QHS  . insulin glargine  15 Units Subcutaneous BID  . ipratropium-albuterol  3 mL Nebulization Q6H  . levothyroxine  88 mcg Oral QAC breakfast  . midazolam  2 mg Intravenous Once  . morphine  15 mg Oral Q12H  . nystatin   Topical TID  . pantoprazole  40 mg Oral Daily  . pravastatin  20 mg Oral QPM  . predniSONE  20 mg Oral Q breakfast  . senna-docusate  1 tablet Oral Daily  . sodium chloride flush  10-40 mL Intracatheter Q12H   Continuous Infusions: . furosemide (LASIX) infusion 4 mg/hr (09/03/18 1800)     LOS: 7 days     Cordelia Poche, MD [facility] 09/04/2018, 1:14 PM  If 7PM-7AM, please contact night-coverage [email or web address]

## 2018-09-04 NOTE — Care Management Important Message (Signed)
Important Message  Patient Details IM Letter given to the Case Manager to present to the Patient Name: Renee Noble MRN: [id number] Date of Birth: 01-25-1956   Medicare Important Message Given:  Yes    [name] 09/04/2018, 12:13 PM

## 2018-09-04 NOTE — Progress Notes (Signed)
Physical Therapy Treatment Patient Details Name: Renee Noble MRN: [id number] DOB: 07-30-1955 Today's Date: 09/04/2018    History of Present Illness Pt admitted through ED 08/28/18 2* acute on chronic respiratory failure.  Pt requiring intubation and extubated 09/01/18.  Pt wtih hx of syncope, DM, Fibromyalgia, CHF, and nephrectomy    PT Comments    Assisted pt to EOB required increased assist.   Pt c/o increased weakness and stated she had been here for a week and only got OOB once.  Attempted standing however pt unable to clear hips off bed and unable to support her weight. Used [employer] lift from seated EOB to recliner.   Evaluating LPT rec HH pending pt's progress however with extended length of stay and assist level needed this session, pt may need ST Rehab at SNF prior to returning home.       Follow Up Recommendations  SNF(will consult LPT on pt's mobility status)     Equipment Recommendations  None recommended by PT    Recommendations for Other Services       Precautions / Restrictions Precautions Precautions: Fall Precaution Comments: home O2 Restrictions Weight Bearing Restrictions: No    Mobility  Bed Mobility Overal bed mobility: Needs Assistance Bed Mobility: Supine to Sit     Supine to sit: Total assist;+2 for physical assistance;+2 for safety/equipment     General bed mobility comments: pt required increased assist this session stating "I'm weak"  Transfers Overall transfer level: Needs assistance               General transfer comment: attempted sit to stand however pt was unable to clear hips off bed so used [employer] Sky to transfer to recliner.   Ambulation/Gait             General Gait Details: unable to achieve upright posture and unable to clear hips off bed  this session    Stairs             Wheelchair Mobility    Modified Rankin (Stroke Patients Only)       Balance                                            Cognition Arousal/Alertness: Awake/alert Behavior During Therapy: WFL for tasks assessed/performed Overall Cognitive Status: Within Functional Limits for tasks assessed                                 General Comments: pleasant      Exercises      General Comments        Pertinent Vitals/Pain Pain Assessment: Faces Faces Pain Scale: Hurts little more Pain Location: bach/chronic Pain Descriptors / Indicators: Constant;Discomfort Pain Intervention(s): Monitored during session;Repositioned    Home Living                      Prior Function            PT Goals (current goals can now be found in the care plan section) Progress towards PT goals: Progressing toward goals    Frequency    Min 3X/week      PT Plan Discharge plan needs to be updated    Co-evaluation              AM-PAC  PT "6 Clicks" Mobility   Outcome Measure  Help needed turning from your back to your side while in a flat bed without using bedrails?: Total Help needed moving from lying on your back to sitting on the side of a flat bed without using bedrails?: Total Help needed moving to and from a bed to a chair (including a wheelchair)?: Total Help needed standing up from a chair using your arms (e.g., wheelchair or bedside chair)?: Total Help needed to walk in hospital room?: Total Help needed climbing 3-5 steps with a railing? : Total 6 Click Score: 6    End of Session Equipment Utilized During Treatment: Oxygen;Gait belt Activity Tolerance: Patient limited by fatigue Patient left: in chair;with call bell/phone within reach Nurse Communication: Mobility status;Need for lift equipment PT Visit Diagnosis: Difficulty in walking, not elsewhere classified (R26.2)     Time: 1028-1100 PT Time Calculation (min) (ACUTE ONLY): 32 min  Charges:  $Therapeutic Activity: 23-37 mins                     Rica Koyanagi  PTA Acute  Rehabilitation Services Pager       [phone] Office      [phone]

## 2018-09-04 NOTE — Progress Notes (Signed)
Continues to decline nocturnal CPAP 

## 2018-09-05 LAB — CBC
HCT: 30.6 % — ABNORMAL LOW (ref 36.0–46.0)
Hemoglobin: 8.6 g/dL — ABNORMAL LOW (ref 12.0–15.0)
MCH: 24.2 pg — ABNORMAL LOW (ref 26.0–34.0)
MCHC: 28.1 g/dL — ABNORMAL LOW (ref 30.0–36.0)
MCV: 86 fL (ref 80.0–100.0)
Platelets: 67 10*3/uL — ABNORMAL LOW (ref 150–400)
RBC: 3.56 MIL/uL — ABNORMAL LOW (ref 3.87–5.11)
RDW: 16.6 % — ABNORMAL HIGH (ref 11.5–15.5)
WBC: 9.3 10*3/uL (ref 4.0–10.5)
nRBC: 0 % (ref 0.0–0.2)

## 2018-09-05 LAB — BASIC METABOLIC PANEL
Anion gap: 12 (ref 5–15)
BUN: 110 mg/dL — ABNORMAL HIGH (ref 8–23)
CO2: 38 mmol/L — ABNORMAL HIGH (ref 22–32)
Calcium: 7.7 mg/dL — ABNORMAL LOW (ref 8.9–10.3)
Chloride: 84 mmol/L — ABNORMAL LOW (ref 98–111)
Creatinine, Ser: 1.97 mg/dL — ABNORMAL HIGH (ref 0.44–1.00)
GFR calc Af Amer: 30 mL/min — ABNORMAL LOW (ref >60)
GFR calc non Af Amer: 26 mL/min — ABNORMAL LOW (ref >60)
Glucose, Bld: 181 mg/dL — ABNORMAL HIGH (ref 70–99)
Potassium: 4 mmol/L (ref 3.5–5.1)
Sodium: 134 mmol/L — ABNORMAL LOW (ref 135–145)

## 2018-09-05 LAB — GLUCOSE, CAPILLARY
Glucose-Capillary: 133 mg/dL — ABNORMAL HIGH (ref 70–99)
Glucose-Capillary: 174 mg/dL — ABNORMAL HIGH (ref 70–99)
Glucose-Capillary: 200 mg/dL — ABNORMAL HIGH (ref 70–99)

## 2018-09-05 MED ORDER — JUVEN PO PACK
1.0000 | PACK | Freq: Two times a day (BID) | ORAL | Status: DC
  Administered 2018-09-05: 1 via ORAL
  Filled 2018-09-05 (×4): qty 1

## 2018-09-05 MED ORDER — IPRATROPIUM-ALBUTEROL 0.5-2.5 (3) MG/3ML IN SOLN
3.0000 mL | Freq: Three times a day (TID) | RESPIRATORY_TRACT | Status: DC
  Filled 2018-09-05: qty 3

## 2018-09-05 NOTE — Progress Notes (Signed)
Patient continues to decline use of nocturnal CPAP.

## 2018-09-05 NOTE — Evaluation (Signed)
Occupational Therapy Evaluation Patient Details Name: Renee Noble MRN: [id number] DOB: 01-11-1956 Today's Date: 09/05/2018    History of Present Illness Pt admitted through ED 08/28/18 2* acute on chronic respiratory failure.  Pt requiring intubation and extubated 09/01/18.  Pt wtih hx of syncope, DM, Fibromyalgia, CHF, and nephrectomy   Clinical Impression   This 63 y/o female presents with the above. PTA pt reports she was mod independent with mobility (short distances) using rollator, received intermittent assist for bathing/dressing ADL from a home health aide 3x/week. Pt presenting with significant weakness and decreased activity tolerance. She currently requires max-totalA for UB and LB ADL, setup-minguard assist for self-feeding and basic grooming ADL at bed level. Pt requiring intermittent rest breaks today due to fatigue, but demonstrates good motivation and desire to progress to her PLOF. She will benefit from continued acute OT services and recommend follow up therapy services at SNF level after discharge to maximize her safety and independence with ADL and mobility. Will follow.     Follow Up Recommendations  SNF;Supervision/Assistance - 24 hour    Equipment Recommendations  Other (comment)(TBA)           Precautions / Restrictions Precautions Precautions: Fall Precaution Comments: home O2 Restrictions Weight Bearing Restrictions: No      Mobility Bed Mobility               General bed mobility comments: unable with +1 assist  Transfers                      Balance                                           ADL either performed or assessed with clinical judgement   ADL Overall ADL's : Needs assistance/impaired Eating/Feeding: Set up;Sitting;Bed level   Grooming: Set up;Min guard;Sitting;Bed level   Upper Body Bathing: Maximal assistance;Bed level       Upper Body Dressing : Maximal assistance;Bed level                      General ADL Comments: pt significantly deconditioned, with weakness and decreased activity tolerance; requires rest breaks with bed level activity this session; pt on4L O2 with sats 87-91% with bed level activity      Vision         Perception     Praxis      Pertinent Vitals/Pain Pain Assessment: Faces Faces Pain Scale: Hurts little more Pain Location: back/chronic Pain Descriptors / Indicators: Constant;Discomfort Pain Intervention(s): Premedicated before session     Hand Dominance Right   Extremity/Trunk Assessment Upper Extremity Assessment Upper Extremity Assessment: Generalized weakness;LUE deficits/detail LUE Deficits / Details: sight decrease in AROM, though still appears close to [facility]   Lower Extremity Assessment Lower Extremity Assessment: Defer to PT evaluation       Communication Communication Communication: No difficulties   Cognition Arousal/Alertness: Awake/alert Behavior During Therapy: WFL for tasks assessed/performed Overall Cognitive Status: Within Functional Limits for tasks assessed                                     General Comments       Exercises Exercises: General Upper Extremity General Exercises - Upper Extremity Shoulder Flexion: AROM;10 reps;Both Shoulder Horizontal ABduction:  AROM;Both;10 reps Shoulder Horizontal ADduction: AROM;Both;10 reps   Shoulder Instructions      Home Living Family/patient expects to be discharged to:: Private residence Living Arrangements: Other relatives Available Help at Discharge: Family;Available 24 hours/day Type of Home: House Home Access: Ramped entrance     Home Layout: One level     Bathroom Shower/Tub: Teacher, early years/pre: Standard     Home Equipment: Cane - quad;Shower seat;Grab bars - tub/shower;Walker - 4 wheels   Additional Comments: uses 3 L O2 [place] at baseline      Prior Functioning/Environment Level of Independence: Needs assistance     ADL's / Homemaking Assistance Needed: reports she has an aide who assists 3x/wk with ADL needs, reports cousin performs approx 90% of iADL tasks but pt assists some   Comments: household and short distanced community ambulator. Uses Rollator as needed.        OT Problem List: Decreased range of motion;Decreased strength;Decreased activity tolerance;Impaired balance (sitting and/or standing);Obesity;Cardiopulmonary status limiting activity;Pain      OT Treatment/Interventions: Self-care/ADL training;Therapeutic exercise;Neuromuscular education;Energy conservation;DME and/or AE instruction;Therapeutic activities;Patient/family education;Balance training    OT Goals(Current goals can be found in the care plan section) Acute Rehab OT Goals Patient Stated Goal: Resume previous lifestyle OT Goal Formulation: With patient Time For Goal Achievement: 09/19/18 Potential to Achieve Goals: Good  OT Frequency: Min 2X/week   Barriers to D/C:            Co-evaluation              AM-PAC OT "6 Clicks" Daily Activity     Outcome Measure Help from another person eating meals?: A Little Help from another person taking care of personal grooming?: A Little Help from another person toileting, which includes using toliet, bedpan, or urinal?: Total Help from another person bathing (including washing, rinsing, drying)?: A Lot Help from another person to put on and taking off regular upper body clothing?: A Lot Help from another person to put on and taking off regular lower body clothing?: Total 6 Click Score: 12   End of Session Equipment Utilized During Treatment: Oxygen Nurse Communication: Mobility status  Activity Tolerance: Patient tolerated treatment well;Patient limited by fatigue Patient left: in bed;with call bell/phone within reach;with bed alarm set  OT Visit Diagnosis: Muscle weakness (generalized) (M62.81)                Time: 1422-1440 OT Time Calculation (min): 18  min Charges:  OT General Charges $OT Visit: 1 Visit OT Evaluation $OT Eval Moderate Complexity: 1 Mod  Lou Cal, OT [employer] Pager [phone] Office [phone]   Raymondo Band 09/05/2018, 3:53 PM

## 2018-09-05 NOTE — Progress Notes (Signed)
Pt. Assessment and documentation has been completed by the (1100-2300) prior RN. This RN assessment finding agrees  with previous RN assessment. 

## 2018-09-05 NOTE — Progress Notes (Signed)
Nutrition Follow-up  DOCUMENTATION CODES:   Morbid obesity  INTERVENTION:   -Provide Juven Fruit Punch BID, each serving provides 95kcal and 2.5g of protein (amino acids glutamine and arginine)  NUTRITION DIAGNOSIS:   Inadequate oral intake related to inability to eat as evidenced by NPO status.  Now on HH/CHO modified diet.  GOAL:   Provide needs based on ASPEN/SCCM guidelines  Progressing.  MONITOR:   PO intake, Supplement acceptance, Labs, Weight trends, I & O's, Skin  ASSESSMENT:   63 y.o. female with medical history significant for chronic hypoxic respiratory failure on 3L O2, severe COPD, CHF, type 2 DM, morbid obesity, and chronic thrombocytopenia. She was admitted to [facility] on 4/13 with acute on chronic hypoxic respiratory failure in the setting of acute on CHF and acute COPD exacerbation. She had presented to [facility] d/t 3-4 days of worsening SOB, 3-5 lb weight gain x1 week, and a chronic non-productive cough. She noted associated expiratory wheezing over 2-3 days. She reported that her cousin, with whom she lives, was diagnosed with pneumonia within the last week, but has been tested twice for COVID-19, with negative results on both occasions.  4/13: admitted to [facility] 4/14: transferred to [facility] ICU, tested for COVID-19-results negative x2 4/15: intubated, TF was started via protocol 4/17: extubated, SLP evaluated: recommends regular diet, no limitations  4/19: Transferred from ICU to 4W  **RD working remotely**  Patient is currently consuming 50% of meals. On 4/19 pt consumed 75% of breakfast and 95% of lunch. Given stage 2 pressure injuries, will order Juven BID to aid in wound healing.  Per weight records, pt's weight has been increasing since admission 4/13, now +27 lb. Pt receiving IV Lasix, will monitor weight trends. Awaiting weight measurement for today.  Medications: Lasix in D5 infusion Labs reviewed: CBGs: 133-174 Low Na GFR: 26  Diet  Order:   Diet Order            Diet heart healthy/carb modified Room service appropriate? Yes; Fluid consistency: Thin; Fluid restriction: 1500 mL Fluid  Diet effective now              EDUCATION NEEDS:   Not appropriate for education at this time  Skin:  Skin Assessment: Skin Integrity Issues: Skin Integrity Issues:: Stage II Stage II: bilateral IT  Last BM:  4/19  Height:   Ht Readings from Last 1 Encounters:  08/28/18 5\' 4"  (1.626 m)    Weight:   Wt Readings from Last 1 Encounters:  09/04/18 (!) 143 kg    Ideal Body Weight:  54.54 kg  BMI:  Body mass index is 54.11 kg/m.  Estimated Nutritional Needs:   Kcal:  1900-2100  Protein:  80-90g  Fluid:  1.5L/day  Clayton Bibles, MS, RD, LDN [place] Dietitian Pager: [phone] After Hours Pager: [phone]

## 2018-09-05 NOTE — Progress Notes (Signed)
PROGRESS NOTE    Renee Noble  XVQ:[id number] DOB: 25-Mar-1956 DOA: 08/28/2018 PCP: [name], PA-C   Brief Narrative: Renee Noble is a 63 y.o. female with history of chronic hypoxic respiratory failure on 3 L nasal cannula at home, severe COPD, chronic diastolic heart failure, diabetes mellitus type 2, obesity, presented to [facility] on 08/28/2018 with acute on chronic respiratory failure in the setting of acute on chronic diastolic heart failure and COPD exacerbation. Patient had 3 to 4 days of progressive shortness of breath and orthopnea along with 3 to 5 pound weight gain in 1 week. She reported that her cousin who lives with her was diagnosed with pneumonia within the last week and is also being tested for COVID 19, negative results. Patient was transferred to [facility] for COVID rule out.  Chest x-ray on admission showed bilateral interstitial/airspace opacities concerning with edema.COVID 19 was negative.  Because of high suspicion it was recommended to repeat, repeat COVID-19 was negative yet. On 08/30/2018, Patient became hypercapnic with increased work of breathing necessitating intubation and mechanical ventilation.  She had drop in blood pressure, received 1 L of fluid and then treated with low-dose Neo-Synephrine.  Extubated to nasal canula on 09/01/2018.  Retrospectively, most of her symptoms are related to fluid overload from diastolic dysfunction and hypercapnia from obesity and sleep apnea.   Assessment & Plan:   Principal Problem:   Acute on chronic respiratory failure with hypoxia (HCC) Active Problems:   Type II diabetes mellitus with renal manifestations (HCC)   Chronic pain syndrome   Acute on chronic diastolic CHF (congestive heart failure) (HCC)   Acute on chronic congestive heart failure (HCC)   Hypothyroidism   Acute exacerbation of chronic obstructive pulmonary disease (COPD) (HCC)   Atrial flutter with rapid ventricular  response (HCC)   AKI (acute kidney injury) ([place])   Pressure ulcer   Acute on chronic respiratory failure with hypoxia and hypercapnia COPD exacerbation COVID-19 ruled out. Was intubated from 4/15 to 4/17. Initially treated with Ceftriaxone/azithromycin and currently on Augmentin. Uses 3 L O2 via [place] at baseline -Continue steroids, bronchodilators -CPAP qhs  Acute on chronic diastolic heart failure Transthoracic Echocardiogram from 03/2018 significant for an EF of 60-65% with previous Transthoracic Echocardiogram significant for grade 1 diastolic dysfunction. Per chart review, patient presented 30 lbs overweight. Started on a Lasix drip at 4 mg/hr. Foley placed. Dry weight likely closer to recent baselines of 272-276 lbs. Currently 315 lbs. Weight pending today. Increased lasix drip to 8 mg/hr with improved diuresis. Still very edematous. -Daily BMP (pending today) -Continue lasix drip at 8 mg/hr  Paroxysmal atrial fibrillation Rate controlled. Telemetry personally reviewed and consistent with atrial flutter. -Continue Cardizem and Eliquis  Acute kidney injury on CKD stage III Baseline creatinine of 1.2. Current creatinine of 1.2 with a peak of 2.82. In setting of heart failure. Associated significantly elevated BUN. No obvious signs of uremia. -Management per above  Hyperkalemia In setting of AKI. Given Kayexalate. Potassium normal.  Diabetes mellitus, type 2 -Continue Lantus and SSI  Hypothyroidism -Continue Synthroid  Chronic pain -Continue morphine  Pressure injury Left/right ischial tuberosities, present on admission   DVT prophylaxis: Eliquis Code Status:   Code Status: Full Code Family Communication: None at bedside Disposition Plan: Discharge to SNF once off of IV lasix   Consultants:   PCCM  Procedures:   ETT  Antimicrobials:  Ceftriaxone (4/15>>4/17)  Azithromycin (4/15>>4/17)   Augmentin (4/18>>   Subjective: No  concerns overnight. No chest  pain or dyspnea.  Objective: Vitals:   09/04/18 1943 09/04/18 2113 09/05/18 0518 09/05/18 0738  BP:  130/61 (!) 141/73   Pulse:  75 74   Resp:  20 16   Temp:  97.8 F (36.6 C) 98.1 F (36.7 C)   TempSrc:  Oral Oral   SpO2: 97% 98% 96% 94%  Weight:      Height:        Intake/Output Summary (Last 24 hours) at 09/05/2018 1058 Last data filed at 09/05/2018 8657 Gross per 24 hour  Intake 1307.43 ml  Output 2450 ml  Net -1142.57 ml   Filed Weights   09/02/18 0500 09/03/18 0500 09/04/18 0500  Weight: (!) 139.1 kg (!) 138.9 kg (!) 143 kg    Examination:  General exam: Appears calm and comfortable Respiratory system: Diminished to  auscultation. Respiratory effort normal. Cardiovascular system: S1 & S2 heard, RRR. No murmurs, rubs, gallops or clicks. Gastrointestinal system: Abdomen is obese, generally soft with significant edema and nontender. Normal bowel sounds heard. Central nervous system: Alert and oriented. No focal neurological deficits. Extremities: No edema. No calf tenderness Skin: No cyanosis. Chronic venous stasis changes of bilateral legs. Psychiatry: Judgement and insight appear normal. Mood & affect appropriate.     Data Reviewed: I have personally reviewed following labs and imaging studies  CBC: Recent Labs  Lab 08/31/18 0330 09/02/18 0111 09/03/18 0710 09/04/18 0334 09/05/18 1042  WBC 6.2 7.9 6.1 5.4 9.3  NEUTROABS  --  7.4  --   --   --   HGB 8.8* 9.5* 9.2* 8.9* 8.6*  HCT 30.7* 34.2* 32.6* 31.5* 30.6*  MCV 84.8 84.9 86.2 85.8 86.0  PLT 86* 84* 68* 63* 67*   Basic Metabolic Panel: Recent Labs  Lab 08/30/18 2018 08/31/18 0330 08/31/18 1521 09/01/18 0500 09/01/18 0826 09/02/18 0111 09/03/18 0710 09/04/18 0334  NA 134* 134* 136  --   --  138 136 136  K 5.4* 5.3* 4.9  --   --  4.7 4.5 4.4  CL 88* 88* 89*  --   --  89* 87* 87*  CO2 35* 34* 34*  --   --  37* 38* 38*  GLUCOSE 176* 163* 210*  --   --  143* 163* 185*  BUN 73* 80* 93*  --   --   111* 108* 90*  CREATININE 2.73* 2.82* 2.80*  --  2.49* 2.10* 1.91* 1.93*  CALCIUM 7.2* 7.4* 7.5*  --   --  8.1* 8.0* 7.9*  MG 2.2 2.2 2.1 2.3  --   --   --   --   PHOS 8.0* 5.8* 6.1* 6.5*  --   --   --   --    GFR: Estimated Creatinine Clearance: 41.3 mL/min (A) (by C-G formula based on SCr of 1.93 mg/dL (H)). Liver Function Tests: Recent Labs  Lab 08/30/18 0320  AST 17  ALT 12  ALKPHOS 60  BILITOT 0.5  PROT 7.3  ALBUMIN 4.0   No results for input(s): LIPASE, AMYLASE in the last 168 hours. No results for input(s): AMMONIA in the last 168 hours. Coagulation Profile: No results for input(s): INR, PROTIME in the last 168 hours. Cardiac Enzymes: Recent Labs  Lab 09/03/18 2230  TROPONINI 0.04*   BNP (last 3 results) No results for input(s): PROBNP in the last 8760 hours. HbA1C: No results for input(s): HGBA1C in the last 72 hours. CBG: Recent Labs  Lab 09/04/18 0744 09/04/18 1118 09/04/18  1706 09/04/18 2204 09/05/18 0754  GLUCAP 171* 168* 175* 197* 133*   Lipid Profile: No results for input(s): CHOL, HDL, LDLCALC, TRIG, CHOLHDL, LDLDIRECT in the last 72 hours. Thyroid Function Tests: No results for input(s): TSH, T4TOTAL, FREET4, T3FREE, THYROIDAB in the last 72 hours. Anemia Panel: No results for input(s): VITAMINB12, FOLATE, FERRITIN, TIBC, IRON, RETICCTPCT in the last 72 hours. Sepsis Labs: Recent Labs  Lab 08/30/18 0320 08/31/18 0330 09/01/18 0500  PROCALCITON <0.10 <0.10 <0.10  LATICACIDVEN 1.4  --   --     Recent Results (from the past 240 hour(s))  MRSA PCR Screening     Status: None   Collection Time: 08/28/18  9:58 PM  Result Value Ref Range Status   MRSA by PCR NEGATIVE NEGATIVE Final    Comment:        The GeneXpert MRSA Assay (FDA approved for NASAL specimens only), is one component of a comprehensive MRSA colonization surveillance program. It is not intended to diagnose MRSA infection nor to guide or monitor treatment for MRSA  infections. Performed at [facility], [address], [place], [place] [address]   Respiratory Panel by PCR     Status: None   Collection Time: 08/29/18  7:27 AM  Result Value Ref Range Status   Adenovirus NOT DETECTED NOT DETECTED Final   Coronavirus 229E NOT DETECTED NOT DETECTED Final    Comment: (NOTE) The Coronavirus on the Respiratory Panel, DOES NOT test for the novel  Coronavirus (2019 nCoV)    Coronavirus HKU1 NOT DETECTED NOT DETECTED Final   Coronavirus NL63 NOT DETECTED NOT DETECTED Final   Coronavirus OC43 NOT DETECTED NOT DETECTED Final   Metapneumovirus NOT DETECTED NOT DETECTED Final   Rhinovirus / Enterovirus NOT DETECTED NOT DETECTED Final   Influenza A NOT DETECTED NOT DETECTED Final   Influenza B NOT DETECTED NOT DETECTED Final   Parainfluenza Virus 1 NOT DETECTED NOT DETECTED Final   Parainfluenza Virus 2 NOT DETECTED NOT DETECTED Final   Parainfluenza Virus 3 NOT DETECTED NOT DETECTED Final   Parainfluenza Virus 4 NOT DETECTED NOT DETECTED Final   Respiratory Syncytial Virus NOT DETECTED NOT DETECTED Final   Bordetella pertussis NOT DETECTED NOT DETECTED Final   Chlamydophila pneumoniae NOT DETECTED NOT DETECTED Final   Mycoplasma pneumoniae NOT DETECTED NOT DETECTED Final    Comment: Performed at [facility], [place] [address], [place], [place] [address]  SARS Coronavirus 2 [facility] order, Performed in [facility] hospital lab)     Status: None   Collection Time: 08/29/18  7:27 AM  Result Value Ref Range Status   SARS Coronavirus 2 NEGATIVE NEGATIVE Final    Comment: (NOTE) If result is NEGATIVE SARS-CoV-2 target nucleic acids are NOT DETECTED. The SARS-CoV-2 RNA is generally detectable in upper and lower  respiratory specimens during the acute phase of infection. The lowest  concentration of SARS-CoV-2 viral copies this assay can detect is 250  copies / mL. A negative result does not preclude SARS-CoV-2 infection  and should not be used as the sole  basis for treatment or other  patient management decisions.  A negative result may occur with  improper specimen collection / handling, submission of specimen other  than nasopharyngeal swab, presence of viral mutation(s) within the  areas targeted by this assay, and inadequate number of viral copies  (<250 copies / mL). A negative result must be combined with clinical  observations, patient history, and epidemiological information. If result is POSITIVE SARS-CoV-2 target  nucleic acids are DETECTED. The SARS-CoV-2 RNA is generally detectable in upper and lower  respiratory specimens dur ing the acute phase of infection.  Positive  results are indicative of active infection with SARS-CoV-2.  Clinical  correlation with patient history and other diagnostic information is  necessary to determine patient infection status.  Positive results do  not rule out bacterial infection or co-infection with other viruses. If result is PRESUMPTIVE POSTIVE SARS-CoV-2 nucleic acids MAY BE PRESENT.   A presumptive positive result was obtained on the submitted specimen  and confirmed on repeat testing.  While 2019 novel coronavirus  (SARS-CoV-2) nucleic acids may be present in the submitted sample  additional confirmatory testing may be necessary for epidemiological  and / or clinical management purposes  to differentiate between  SARS-CoV-2 and other Sarbecovirus currently known to infect humans.  If clinically indicated additional testing with an alternate test  methodology [phone]) is advised. The SARS-CoV-2 RNA is generally  detectable in upper and lower respiratory sp ecimens during the acute  phase of infection. The expected result is Negative. Fact Sheet for Patients:  StrictlyIdeas.no Fact Sheet for Healthcare Providers: BankingDealers.co.za This test is not yet approved or cleared by the [place] FDA and has been authorized for detection  and/or diagnosis of SARS-CoV-2 by FDA under an Emergency Use Authorization (EUA).  This EUA will remain in effect (meaning this test can be used) for the duration of the COVID-19 declaration under Section 564(b)(1) of the Act, 21 U.S.C. section 360bbb-3(b)(1), unless the authorization is terminated or revoked sooner. Performed at [facility], [place] [address], [place], [place] [address]   Novel Coronavirus, NAA (hospital order; send-out to ref lab)     Status: None   Collection Time: 08/30/18 12:05 PM  Result Value Ref Range Status   SARS-CoV-2, NAA NOT DETECTED NOT DETECTED Final    Comment: (NOTE) This test was developed and its performance characteristics determined by [employer]. This test has not been FDA cleared or approved. This test has been authorized by FDA under an Emergency Use Authorization (EUA). This test has been validated in accordance with the FDA's Guidance Document (Policy for [place] in Laboratories Certified to Perform High Complexity Testing under CLIA prior to Emergency Use Authorization for Coronavirus [id number] during the [facility] Emergency) issued on February 29th, 2020. FDA independent review of this validation is pending. This test is only authorized for the duration of time the declaration that circumstances exist justifying the authorization of the emergency use of in vitro diagnostic tests for detection of SARS-CoV- 2 virus and/or diagnosis of COVID-19 infection under section 564(b)(1) of the Act, 21 U.S.C. 433IRJ-1(O)(8), unless the authorization is terminated or revoked sooner. Performed At: [facility] [place], [place] [id number] Rush Farmer MD SW:[id number]    [place]  Final    Comment: Performed at [place] [address], [place], [place] [address]  Culture, respiratory (non-expectorated)     Status: None   Collection Time:  08/30/18  7:47 PM  Result Value Ref Range Status   Specimen Description   Final    ENDOTRACHEAL Performed at [place] [address], [place],  [address]    Special Requests   Final    NONE Performed at [facility], [place] [address] [place], [place] [address]    Gram Stain   Final    ABUNDANT WBC PRESENT,BOTH PMN AND MONONUCLEAR ABUNDANT GRAM POSITIVE  RODS    Culture   Final    Consistent with normal respiratory flora. Performed at [facility], [place] [address], [place], [place] [address]    Report Status 09/01/2018 FINAL  Final         Radiology Studies: US Renal  Result Date: 09/04/2018 CLINICAL DATA:  Acute kidney injury. History of left nephrectomy, chronic kidney disease stage 3 and bladder tack procedure. EXAM: RENAL / URINARY TRACT ULTRASOUND COMPLETE COMPARISON:  06/21/2016 renal sonogram. FINDINGS: Right Kidney: Renal measurements: 16.7 x 6.7 x 5.2 cm = volume: 302 mL. Compensatory hypertrophy of the right kidney. Renal parenchymal thickness and echogenicity within normal limits. No hydronephrosis. No renal mass. Left Kidney: Surgically absent. No mass or fluid collection demonstrated in the left nephrectomy bed. Bladder: Limited visualization of the nondistended grossly normal bladder. IMPRESSION: 1. Compensatory hypertrophy of the otherwise normal right kidney. No right hydronephrosis. 2. Left nephrectomy. 3. Limited visualization of the nondistended normal appearing bladder. Electronically Signed   By: Ilona Sorrel M.D.   On: 09/04/2018 08:37        Scheduled Meds: . amoxicillin-clavulanate  1 tablet Oral Q12H  . apixaban  5 mg Oral BID  . chlorhexidine gluconate (MEDLINE KIT)  15 mL Mouth Rinse BID  . Chlorhexidine Gluconate Cloth  6 each Topical Daily  . diltiazem  60 mg Oral Q6H  . fentaNYL (SUBLIMAZE) injection  100 mcg Intravenous Once  . insulin aspart  0-20 Units Subcutaneous TID WC  . insulin aspart   0-5 Units Subcutaneous QHS  . insulin glargine  15 Units Subcutaneous BID  . ipratropium-albuterol  3 mL Nebulization Q6H WA  . levothyroxine  88 mcg Oral QAC breakfast  . midazolam  2 mg Intravenous Once  . morphine  15 mg Oral Q12H  . nutrition supplement (JUVEN)  1 packet Oral BID BM  . nystatin   Topical TID  . pantoprazole  40 mg Oral Daily  . pravastatin  20 mg Oral QPM  . predniSONE  20 mg Oral Q breakfast  . senna-docusate  1 tablet Oral Daily  . sodium chloride flush  10-40 mL Intracatheter Q12H   Continuous Infusions: . furosemide (LASIX) infusion 8 mg/hr (09/05/18 2423)     LOS: 8 days     Cordelia Poche, MD [facility] 09/05/2018, 10:58 AM  If 7PM-7AM, please contact night-coverage [email or web address]

## 2018-09-05 NOTE — Plan of Care (Signed)
  Problem: Clinical Measurements: Goal: Ability to maintain clinical measurements within normal limits will improve Outcome: Progressing Goal: Will remain free from infection Outcome: Progressing Goal: Diagnostic test results will improve Outcome: Progressing Goal: Respiratory complications will improve Outcome: Progressing Goal: Cardiovascular complication will be avoided Outcome: Progressing   Problem: Activity: Goal: Risk for activity intolerance will decrease Outcome: Progressing   Problem: Coping: Goal: Level of anxiety will decrease Outcome: Progressing   Problem: Elimination: Goal: Will not experience complications related to urinary retention Outcome: Progressing   Problem: Safety: Goal: Ability to remain free from injury will improve Outcome: Progressing   Problem: Skin Integrity: Goal: Risk for impaired skin integrity will decrease Outcome: Progressing   Problem: Cardiac: Goal: Ability to achieve and maintain adequate cardiopulmonary perfusion will improve Outcome: Progressing   Problem: Health Behavior/Discharge Planning: Goal: Ability to safely manage health-related needs after discharge will improve Outcome: Progressing

## 2018-09-06 ENCOUNTER — Inpatient Hospital Stay (HOSPITAL_COMMUNITY): Payer: Medicare Other

## 2018-09-06 ENCOUNTER — Inpatient Hospital Stay (HOSPITAL_COMMUNITY): Payer: Medicare Other | Admitting: Certified Registered Nurse Anesthetist

## 2018-09-06 DIAGNOSIS — Z4659 Encounter for fitting and adjustment of other gastrointestinal appliance and device: Secondary | ICD-10-CM

## 2018-09-06 DIAGNOSIS — Z978 Presence of other specified devices: Secondary | ICD-10-CM

## 2018-09-06 DIAGNOSIS — N179 Acute kidney failure, unspecified: Secondary | ICD-10-CM

## 2018-09-06 DIAGNOSIS — J449 Chronic obstructive pulmonary disease, unspecified: Secondary | ICD-10-CM

## 2018-09-06 LAB — CBC WITH DIFFERENTIAL/PLATELET
Basophils Absolute: 0 10*3/uL (ref 0.0–0.1)
Basophils Relative: 0 %
Eosinophils Absolute: 0 10*3/uL (ref 0.0–0.5)
Eosinophils Relative: 0 %
HCT: 28.6 % — ABNORMAL LOW (ref 36.0–46.0)
Hemoglobin: 8 g/dL — ABNORMAL LOW (ref 12.0–15.0)
Lymphocytes Relative: 2 %
Lymphs Abs: 0.1 10*3/uL — ABNORMAL LOW (ref 0.7–4.0)
MCHC: 28 g/dL — ABNORMAL LOW (ref 30.0–36.0)
MCV: 85.6 fL (ref 80.0–100.0)
Monocytes Absolute: 0.3 10*3/uL (ref 0.1–1.0)
Monocytes Relative: 5 %
Neutro Abs: 6.1 10*3/uL (ref 1.7–7.7)
Neutrophils Relative %: 92 %
Platelets: 60 10*3/uL — ABNORMAL LOW (ref 150–400)
RBC: 3.34 MIL/uL — ABNORMAL LOW (ref 3.87–5.11)
RDW: 16.6 % — ABNORMAL HIGH (ref 11.5–15.5)
WBC: 6.5 10*3/uL (ref 4.0–10.5)

## 2018-09-06 LAB — BLOOD GAS, ARTERIAL
Acid-Base Excess: 10.3 mmol/L — ABNORMAL HIGH (ref 0.0–2.0)
Acid-Base Excess: 12.7 mmol/L — ABNORMAL HIGH (ref 0.0–2.0)
Bicarbonate: 40.6 mmol/L — ABNORMAL HIGH (ref 20.0–28.0)
Bicarbonate: 39.6 mmol/L — ABNORMAL HIGH (ref 20.0–28.0)
Drawn by: 232811
Drawn by: 308601
FIO2: 40
MECHVT: 430 mL
O2 Content: 4 L/min
O2 Saturation: 83.5 %
O2 Saturation: 89 %
PEEP: 5 cmH2O
Patient temperature: 97.7
Patient temperature: 98.6
RATE: 24 resp/min
pCO2 arterial: 110 mmHg (ref 32.0–48.0)
pCO2 arterial: 72.1 mmHg (ref 32.0–48.0)
pH, Arterial: 7.189 — CL (ref 7.350–7.450)
pH, Arterial: 7.359 (ref 7.350–7.450)
pO2, Arterial: 54.6 mmHg — ABNORMAL LOW (ref 83.0–108.0)
pO2, Arterial: 56.5 mmHg — ABNORMAL LOW (ref 83.0–108.0)

## 2018-09-06 LAB — GLUCOSE, CAPILLARY
Glucose-Capillary: 141 mg/dL — ABNORMAL HIGH (ref 70–99)
Glucose-Capillary: 149 mg/dL — ABNORMAL HIGH (ref 70–99)
Glucose-Capillary: 157 mg/dL — ABNORMAL HIGH (ref 70–99)
Glucose-Capillary: 159 mg/dL — ABNORMAL HIGH (ref 70–99)
Glucose-Capillary: 163 mg/dL — ABNORMAL HIGH (ref 70–99)
Glucose-Capillary: 167 mg/dL — ABNORMAL HIGH (ref 70–99)
Glucose-Capillary: 188 mg/dL — ABNORMAL HIGH (ref 70–99)

## 2018-09-06 LAB — BASIC METABOLIC PANEL
Anion gap: 12 (ref 5–15)
BUN: 110 mg/dL — ABNORMAL HIGH (ref 8–23)
CO2: 38 mmol/L — ABNORMAL HIGH (ref 22–32)
Calcium: 7.9 mg/dL — ABNORMAL LOW (ref 8.9–10.3)
Chloride: 85 mmol/L — ABNORMAL LOW (ref 98–111)
Creatinine, Ser: 1.77 mg/dL — ABNORMAL HIGH (ref 0.44–1.00)
GFR calc Af Amer: 34 mL/min — ABNORMAL LOW (ref >60)
GFR calc non Af Amer: 30 mL/min — ABNORMAL LOW (ref >60)
Glucose, Bld: 176 mg/dL — ABNORMAL HIGH (ref 70–99)
Potassium: 3.9 mmol/L (ref 3.5–5.1)
Sodium: 135 mmol/L (ref 135–145)

## 2018-09-06 MED ORDER — DEXMEDETOMIDINE HCL IN NACL 400 MCG/100ML IV SOLN
0.4000 ug/kg/h | INTRAVENOUS | Status: DC
  Administered 2018-09-06: 23:00:00 0.4 ug/kg/h via INTRAVENOUS
  Administered 2018-09-07: 0.7 ug/kg/h via INTRAVENOUS
  Administered 2018-09-07 (×2): 0.5 ug/kg/h via INTRAVENOUS
  Administered 2018-09-08 (×2): 0.6 ug/kg/h via INTRAVENOUS
  Filled 2018-09-06 (×8): qty 100

## 2018-09-06 MED ORDER — FENTANYL CITRATE (PF) 100 MCG/2ML IJ SOLN
25.0000 ug | INTRAMUSCULAR | Status: DC | PRN
  Administered 2018-09-06 – 2018-09-08 (×4): 100 ug via INTRAVENOUS
  Filled 2018-09-06 (×5): qty 2

## 2018-09-06 MED ORDER — DEXMEDETOMIDINE HCL IN NACL 200 MCG/50ML IV SOLN: 0.4000 ug/kg/h | INTRAVENOUS | Status: DC

## 2018-09-06 MED ORDER — FENTANYL CITRATE (PF) 100 MCG/2ML IJ SOLN: 25.0000 ug | INTRAMUSCULAR | Status: DC | PRN

## 2018-09-06 MED ORDER — DILTIAZEM HCL ER COATED BEADS 180 MG PO CP24: 180.0000 mg | ORAL_CAPSULE | Freq: Every day | ORAL | Status: DC

## 2018-09-06 MED ORDER — IPRATROPIUM-ALBUTEROL 0.5-2.5 (3) MG/3ML IN SOLN
3.0000 mL | Freq: Four times a day (QID) | RESPIRATORY_TRACT | Status: DC
  Administered 2018-09-07 – 2018-09-10 (×16): 3 mL via RESPIRATORY_TRACT
  Filled 2018-09-06 (×17): qty 3

## 2018-09-06 MED ORDER — PANTOPRAZOLE SODIUM 40 MG IV SOLR
40.0000 mg | Freq: Every day | INTRAVENOUS | Status: DC
  Administered 2018-09-06 – 2018-09-09 (×4): 40 mg via INTRAVENOUS
  Filled 2018-09-06 (×4): qty 40

## 2018-09-06 MED ORDER — LEVOTHYROXINE SODIUM 100 MCG/5ML IV SOLN
44.0000 ug | Freq: Every day | INTRAVENOUS | Status: DC
  Administered 2018-09-07 – 2018-09-08 (×2): 44 ug via INTRAVENOUS
  Filled 2018-09-06 (×2): qty 5

## 2018-09-06 MED ORDER — BUDESONIDE 0.5 MG/2ML IN SUSP
0.5000 mg | Freq: Two times a day (BID) | RESPIRATORY_TRACT | Status: DC
  Administered 2018-09-06 – 2018-09-15 (×18): 0.5 mg via RESPIRATORY_TRACT
  Filled 2018-09-06 (×21): qty 2

## 2018-09-06 MED ORDER — ARFORMOTEROL TARTRATE 15 MCG/2ML IN NEBU
15.0000 ug | INHALATION_SOLUTION | Freq: Two times a day (BID) | RESPIRATORY_TRACT | Status: DC
  Administered 2018-09-06 – 2018-09-15 (×18): 15 ug via RESPIRATORY_TRACT
  Filled 2018-09-06 (×21): qty 2

## 2018-09-06 MED ORDER — INSULIN ASPART 100 UNIT/ML ~~LOC~~ SOLN
0.0000 [IU] | SUBCUTANEOUS | Status: DC
  Administered 2018-09-07: 2 [IU] via SUBCUTANEOUS
  Administered 2018-09-07: 3 [IU] via SUBCUTANEOUS
  Administered 2018-09-07: 5 [IU] via SUBCUTANEOUS
  Administered 2018-09-07: 21:00:00 3 [IU] via SUBCUTANEOUS
  Administered 2018-09-08 (×2): 5 [IU] via SUBCUTANEOUS
  Administered 2018-09-08: 3 [IU] via SUBCUTANEOUS
  Administered 2018-09-08: 23:00:00 2 [IU] via SUBCUTANEOUS
  Administered 2018-09-08: 16:00:00 5 [IU] via SUBCUTANEOUS
  Administered 2018-09-08: 04:00:00 8 [IU] via SUBCUTANEOUS
  Administered 2018-09-09 (×2): 3 [IU] via SUBCUTANEOUS
  Administered 2018-09-09 – 2018-09-10 (×3): 2 [IU] via SUBCUTANEOUS
  Administered 2018-09-10: 3 [IU] via SUBCUTANEOUS
  Administered 2018-09-10: 2 [IU] via SUBCUTANEOUS
  Administered 2018-09-12: 3 [IU] via SUBCUTANEOUS
  Administered 2018-09-12 (×2): 2 [IU] via SUBCUTANEOUS
  Administered 2018-09-12 – 2018-09-13 (×2): 3 [IU] via SUBCUTANEOUS
  Administered 2018-09-13: 2 [IU] via SUBCUTANEOUS
  Administered 2018-09-13: 5 [IU] via SUBCUTANEOUS
  Administered 2018-09-14: 3 [IU] via SUBCUTANEOUS
  Administered 2018-09-14: 5 [IU] via SUBCUTANEOUS
  Administered 2018-09-14: 2 [IU] via SUBCUTANEOUS
  Administered 2018-09-14 – 2018-09-15 (×5): 3 [IU] via SUBCUTANEOUS
  Administered 2018-09-15: 2 [IU] via SUBCUTANEOUS
  Administered 2018-09-15: 3 [IU] via SUBCUTANEOUS
  Administered 2018-09-15 – 2018-09-16 (×2): 5 [IU] via SUBCUTANEOUS
  Administered 2018-09-16: 2 [IU] via SUBCUTANEOUS

## 2018-09-06 MED ORDER — ORAL CARE MOUTH RINSE
15.0000 mL | OROMUCOSAL | Status: DC
  Administered 2018-09-07 – 2018-09-08 (×14): 15 mL via OROMUCOSAL

## 2018-09-06 MED ORDER — POTASSIUM CHLORIDE CRYS ER 20 MEQ PO TBCR
40.0000 meq | EXTENDED_RELEASE_TABLET | Freq: Every day | ORAL | Status: DC
  Administered 2018-09-06: 40 meq via ORAL
  Filled 2018-09-06: qty 2

## 2018-09-06 MED ORDER — DOCUSATE SODIUM 50 MG/5ML PO LIQD: 100.0000 mg | Freq: Two times a day (BID) | ORAL | Status: DC | PRN

## 2018-09-06 MED ORDER — TORSEMIDE 20 MG PO TABS
80.0000 mg | ORAL_TABLET | Freq: Every day | ORAL | Status: DC
  Administered 2018-09-06: 80 mg via ORAL
  Filled 2018-09-06: qty 4

## 2018-09-06 MED ORDER — INSULIN DETEMIR 100 UNIT/ML ~~LOC~~ SOLN
15.0000 [IU] | Freq: Two times a day (BID) | SUBCUTANEOUS | Status: DC
  Administered 2018-09-07 – 2018-09-12 (×11): 15 [IU] via SUBCUTANEOUS
  Filled 2018-09-06 (×13): qty 0.15

## 2018-09-06 MED ORDER — DEXMEDETOMIDINE HCL IN NACL 400 MCG/100ML IV SOLN: 0.4000 ug/kg/h | INTRAVENOUS | Status: DC

## 2018-09-06 MED ORDER — SUCCINYLCHOLINE CHLORIDE 20 MG/ML IJ SOLN
INTRAMUSCULAR | Status: DC | PRN
  Administered 2018-09-06: 100 mg via INTRAVENOUS

## 2018-09-06 MED ORDER — MORPHINE SULFATE (PF) 2 MG/ML IV SOLN: 1.0000 mg | INTRAVENOUS | Status: DC | PRN

## 2018-09-06 MED ORDER — METOLAZONE 5 MG PO TABS
5.0000 mg | ORAL_TABLET | Freq: Once | ORAL | Status: AC
  Administered 2018-09-06: 5 mg via ORAL
  Filled 2018-09-06: qty 1

## 2018-09-06 MED ORDER — ETOMIDATE 2 MG/ML IV SOLN
INTRAVENOUS | Status: DC | PRN
  Administered 2018-09-06: 10 mg via INTRAVENOUS

## 2018-09-06 MED ORDER — FUROSEMIDE 10 MG/ML IJ SOLN
80.0000 mg | Freq: Once | INTRAMUSCULAR | Status: AC
  Administered 2018-09-07: 80 mg via INTRAVENOUS
  Filled 2018-09-06: qty 8

## 2018-09-06 MED ORDER — MIDAZOLAM HCL 2 MG/2ML IJ SOLN: 1.0000 mg | INTRAMUSCULAR | Status: DC | PRN

## 2018-09-06 MED ORDER — BISACODYL 10 MG RE SUPP
10.0000 mg | Freq: Every day | RECTAL | Status: DC | PRN
  Administered 2018-09-12: 10 mg via RECTAL
  Filled 2018-09-06 (×2): qty 1

## 2018-09-06 MED ORDER — INSULIN ASPART 100 UNIT/ML ~~LOC~~ SOLN: 0.0000 [IU] | SUBCUTANEOUS | Status: DC

## 2018-09-06 MED ORDER — DILTIAZEM 12 MG/ML ORAL SUSPENSION
60.0000 mg | Freq: Four times a day (QID) | ORAL | Status: DC
  Administered 2018-09-07 – 2018-09-08 (×6): 60 mg via ORAL
  Filled 2018-09-06 (×7): qty 6

## 2018-09-06 MED ORDER — PREDNISONE 5 MG/5ML PO SOLN
20.0000 mg | Freq: Every day | ORAL | Status: DC
  Administered 2018-09-07 – 2018-09-08 (×2): 20 mg
  Filled 2018-09-06 (×2): qty 20

## 2018-09-06 MED ORDER — APIXABAN 5 MG PO TABS
5.0000 mg | ORAL_TABLET | Freq: Two times a day (BID) | ORAL | Status: DC
  Administered 2018-09-07 – 2018-09-15 (×17): 5 mg via ORAL
  Filled 2018-09-06 (×17): qty 1

## 2018-09-06 NOTE — Consult Note (Signed)
..   NAME:  Renee Noble, MRN:  [id number], DOB:  24-May-1955, LOS: 9 ADMISSION DATE:  08/28/2018, CONSULTATION DATE:  09/06/2018 REFERRING MD:  HOSPITALIST, CHIEF COMPLAINT:  Acute resp distress   Brief History   63 yo F w/ PMHx sig for Severe COPD, chronic respiratory failure on 3 liters oxygen, diastolic CHF, DM II, Hypothyroidism, OSA, Depression, Chronic pain. Admitted on 4/14 for SOB. (COVID negative  4/15 on repeat test) per EMR refusing CPAP at night. Hypercapnic resp failure now intubated. PCCM consulted  History of present illness   (History obtained from EMR and acct of other providers as patient is intubated and sedated at time of my evaluation)  63 yr old female w/ PMHx of severe COPD on 3 liters oxygen, diastolic CHF, DM II, Hypothyroidism, OSA, Depression, Chronic pain. Admitted on 4/14 for SOB. (COVID negative x 2 last test was on 4/15) was intubated for hypercapnic resp failure with AMS.  On4/15/2020,(Patient became hypercapnic with increased work of breathing necessitating intubation and mechanical ventilation. She had drop in blood pressure, received 1 L of fluid and then treated with low-dose Neo-Synephrine.)  Extubated to nasal canula on 09/01/2018. was being followed by PCCM Improved and was transitioned to Hospitalist service on 4/18.  Per EMR - RT notes state that pt has been refusing NIPPV at night.  Now on 4/22 Pt was lethargic not answering appropiately w/ sig change in breathing pattern. Found to be hypercapnic on ABG. Anesthesia intubated. PCCM consulted to assume care.   Past Medical History  .[name] Kitchen Active Ambulatory Problems    Diagnosis Date Noted  . COPD GOLDIII with min reversibility  12/16/2013  . History of hepatitis C 12/16/2013  . Breast cancer screening 12/16/2013  . Type II diabetes mellitus with renal manifestations ([place]) 12/16/2013  . Screening for osteoporosis 12/16/2013  . Abnormal CXR 02/15/2014  . Hernia of abdominal cavity 07/30/2014  .  Encounter for chronic pain management 09/02/2014  . Thyroid activity decreased 06/23/2015  . Thrombocytopenia ([place]) 07/12/2015  . Hypoxia   . Hyperlipidemia 01/16/2016  . Essential hypertension 01/16/2016  . CKD (chronic kidney disease), stage III ([place]) 04/14/2016  . Chronic respiratory failure with hypoxia and hypercapnia ([place]) 04/28/2017  . Morbid obesity due to excess calories ([place]) 04/29/2017  . Cor pulmonale (chronic) ([place]) 04/29/2017  . OSA (obstructive sleep apnea) 08/16/2017  . Normocytic anemia 02/24/2018  . Chronic pain syndrome   . COPD (chronic obstructive pulmonary disease) ([place]) 02/25/2018  . PNA (pneumonia) 03/21/2018  . Chronic pain 04/07/2018  . Acute on chronic diastolic CHF (congestive heart failure) ([place]) 04/07/2018  . Morbid obesity with BMI of 50.0-59.9, adult ([place]) 04/10/2018  . Acute on chronic congestive heart failure ([place])   . Lung nodule 05/08/2018  . Acute on chronic respiratory failure with hypoxia ([place]) 06/09/2018  . HLD (hyperlipidemia) 06/09/2018  . Hypothyroidism 06/09/2018  . Depression 06/09/2018  . Fibromyalgia   . Hypoglycemia due to insulin 07/14/2018  . Pressure injury of skin 07/17/2018  . Wound of thigh 08/15/2018   Resolved Ambulatory Problems    Diagnosis Date Noted  . Cellulitis of leg, left 12/16/2013  . Acute cystitis without hematuria 12/16/2013  . Hyperlipidemia LDL goal <100 12/16/2013  . CKD (chronic kidney disease) 12/16/2013  . Tinea corporis 12/16/2013  . UTI (urinary tract infection) 05/31/2014  . Urinary frequency 01/15/2015  . COPD exacerbation ([place]) 06/18/2015  . COPD with exacerbation ([place]) 07/11/2015  . Nicotine abuse 07/11/2015  . Respiratory failure, acute ([place]) 07/11/2015  .  AKI (acute kidney injury) ([place]) 07/11/2015  . COPD (chronic obstructive pulmonary disease) with acute bronchitis ([place]) 07/12/2015  . Calf swelling 08/25/2016  . Acute on chronic respiratory failure with hypoxia and hypercapnia ([place])  02/24/2018  . COPD exacerbation ([place]) 06/09/2018  . Acute CHF (congestive heart failure) ([place]) 07/14/2018   Past Medical History:  Diagnosis Date  . Abdominal mass of other site   . Cervical compression fracture ([place])   . CHF (congestive heart failure) ([place])   . Chronic kidney disease, stage 3 ([place])   . Chronic lower limb pain   . CTS (carpal tunnel syndrome)   . Diabetes ([place])   . Hepatitis C   . Hypercholesteremia   . Hypertension   . Morbid obesity ([place])   . Neuropathy   . Osteoarthritis   . Renal cancer ([place])   . RLS (restless legs syndrome)   . Syncope   . Venous stasis     Significant Hospital Events   4/18>> Pt refused CPAP due to nausea and vomitting 4/19 requiring 4L O2 via [place] ( at home on 3L) 4/20>>>continues to decline nocturnal CPAP Consults:  Anesthesia for intubation >>>>4/22 PCCM>>>> 4/15  Procedures:  Endotracheal Intubation>>>>08/30/2018 CVC Left subclavian>>>>>08/30/2018 Endotracheal intubation >>>>>09/06/2018  Significant Diagnostic Tests:  ABG with pH 7.1, PCO2 111, PO2 50s, Bicarb 40.  Micro Data:  COVID 19 negative on 4/15 ( RULED OUT x 2) MRSA PCR negative Abundant GPRs on resp culture 4/15 Antimicrobials:  Rocephin 4/15 >> completed Zithromax 4/15 >>  completed Augmentin >>4/18 - total of 5 days  Objective   Blood pressure (!) 134/47, pulse 75, temperature 97.6 F (36.4 C), temperature source Oral, resp. rate (!) 24, height 5\' 4"  (1.626 m), weight (!) 143 kg, SpO2 99 %.    Vent Mode: PRVC FiO2 (%):  [40 %] 40 % Set Rate:  [24 bmp] 24 bmp Vt Set:  [430 mL] 430 mL PEEP:  [5 cmH20] 5 cmH20 Plateau Pressure:  [28 cmH20] 28 cmH20   Intake/Output Summary (Last 24 hours) at 09/06/2018 2215 Last data filed at 09/06/2018 1959 Gross per 24 hour  Intake 1443.4 ml  Output 1975 ml  Net -531.6 ml   Filed Weights   09/02/18 0500 09/03/18 0500 09/04/18 0500  Weight: (!) 139.1 kg (!) 138.9 kg (!) 143 kg    Examination: General: morbidly obese  female sedated and intubated HENT: normocephalic ecchymoses around lower neck and chest. ETT and OGT in place Lungs: coarse rhonci b/l difficult to auscultate due to body habitus Cardiovascular: irregular HR Abdomen: obese non distended abdomen with large pannus non tender on exam Extremities: chronic hyperpigmentation w/ edema Neuro: sedated on my evaluation Skin: lower ext venous stasis changes. Ecchymoses around upper chest   Assessment & Plan:  1. Acute Hypercapnic Resp Failure: multifactorial etiology COPD w/ Obesity Hypoventilation syndrome, Pulmonary edema and non compliance with NIPPV therapy PCO2 111 on ABG Now intubated  Plan: Will f/u CXR post intubation TV 8cc/kg goal for increased MVe Watch for air trapping given decreased exp time Will repeat ABG 1 hr post intubation VAPrecautions Once ph normalizes  SBT When pt meets parameters for extubation pls extubated to NIPPV  2. Acute encephalopathy secondary to hypercapnia Plan: While intubated Pain and sedation protocol goal RASS -2 Precedex (watch for hypotension and bradycardia) with intermittent fentanyl  Correct acid base derangement with adequate ventilation Will wean sedation once normalized and assess mental status  3. H/o severe COPD (GOLD 4  CAT>10 MMRC >2) FEV1 30%  on last spiro. BMI 54. Walk distance was < 156m on last pulm visit was being followed by outpatient Pulm for clearance for a hernia surgery. BODE score is 8 >>> 18% est 4 yr survival.  Likely candidate for palliative conversations  Plan: Med review completed SABA, LABA, LAMA ICS Continue prednisone which was started on 4/18 will taper over 10 to 14 days  4.  Acute Pulmonary edema Cardiogenix vs NPPE Pulmonary vascular congestion with blunting of diaphragm on CXR Plan: Continue on diuresis Place indwelling foley catheter If there is a component secondary to NPPE it should improve post intubation  5. Chronic Aflutter with AV block On Eliquis   Rate controlled at this time If increased rate will consider ppt causes such as beta agonist therapy, sedation needs Plan: Continue on eliquis via OGT  6. H/o CAP Completed antibiotic course COVID R/O w/ 2 negative test Plan: No need for repeat antibiotics at this time Trend WBC and fever curve  WBC may be increased from baseline secondary to steroids  7. IDDM now on steroid taper Metabolic syndrome with insulin resistance,  Complicated by steroid induced hyperglycemia Plan: Long acting insulin should remain on If NPO consider 1/2 dose ISS Goal BG180mg /dl  8. Acute Kidney Injury Baseline Cr on 3/16 was 1.17 Peaked at 2.82 now 1.77 Plan: Avoid nephrotoxic medications  Pt is on diuresis with lasix which may contribute Bladder scan >> may be difficult due to pannus Pt has a h/o urinary retention Place indwelling foley catheter while intubated and receiving IV diuresis  Best practice:  Diet: NPO Pain/Anxiety/Delirium protocol (if indicated): fenatnyl and precedex VAP protocol (if indicated): yes DVT prophylaxis: Eliquis GI prophylaxis: Protonix Glucose control: Levemir w/ ISS Mobility: bedrest while intubated Code Status: FULL Family Communication: has been notified by Hospitalist given change in clinical condition Disposition: ICU  Labs   CBC: Recent Labs  Lab 08/31/18 0330 09/02/18 0111 09/03/18 0710 09/04/18 0334 09/05/18 1042  WBC 6.2 7.9 6.1 5.4 9.3  NEUTROABS  --  7.4  --   --   --   HGB 8.8* 9.5* 9.2* 8.9* 8.6*  HCT 30.7* 34.2* 32.6* 31.5* 30.6*  MCV 84.8 84.9 86.2 85.8 86.0  PLT 86* 84* 68* 63* 67*    Basic Metabolic Panel: Recent Labs  Lab 08/31/18 0330 08/31/18 1521 09/01/18 0500  09/02/18 0111 09/03/18 0710 09/04/18 0334 09/05/18 1042 09/06/18 0602  NA 134* 136  --   --  138 136 136 134* 135  K 5.3* 4.9  --   --  4.7 4.5 4.4 4.0 3.9  CL 88* 89*  --   --  89* 87* 87* 84* 85*  CO2 34* 34*  --   --  37* 38* 38* 38* 38*  GLUCOSE 163*  210*  --   --  143* 163* 185* 181* 176*  BUN 80* 93*  --   --  111* 108* 90* 110* 110*  CREATININE 2.82* 2.80*  --    < > 2.10* 1.91* 1.93* 1.97* 1.77*  CALCIUM 7.4* 7.5*  --   --  8.1* 8.0* 7.9* 7.7* 7.9*  MG 2.2 2.1 2.3  --   --   --   --   --   --   PHOS 5.8* 6.1* 6.5*  --   --   --   --   --   --    < > = values in this interval not displayed.   GFR: Estimated Creatinine Clearance: 45 mL/min (A) (by  C-G formula based on SCr of 1.77 mg/dL (H)). Recent Labs  Lab 08/31/18 0330 09/01/18 0500 09/02/18 0111 09/03/18 0710 09/04/18 0334 09/05/18 1042  PROCALCITON <0.10 <0.10  --   --   --   --   WBC 6.2  --  7.9 6.1 5.4 9.3    Liver Function Tests: No results for input(s): AST, ALT, ALKPHOS, BILITOT, PROT, ALBUMIN in the last 168 hours. No results for input(s): LIPASE, AMYLASE in the last 168 hours. No results for input(s): AMMONIA in the last 168 hours.  ABG    Component Value Date/Time   PHART 7.189 (LL) 09/06/2018 2125   PCO2ART 110 (HH) 09/06/2018 2125   PO2ART 54.6 (L) 09/06/2018 2125   HCO3 40.6 (H) 09/06/2018 2125   TCO2 36.8 07/15/2015 0425   O2SAT 83.5 09/06/2018 2125     Coagulation Profile: No results for input(s): INR, PROTIME in the last 168 hours.  Cardiac Enzymes: Recent Labs  Lab 09/03/18 2230  TROPONINI 0.04*    HbA1C: Hgb A1c MFr Bld  Date/Time Value Ref Range Status  06/12/2018 04:10 AM 5.6 4.8 - 5.6 % Final    Comment:    (NOTE) Pre diabetes:          5.7%-6.4% Diabetes:              >6.4% Glycemic control for   <7.0% adults with diabetes   04/09/2018 05:39 AM 5.2 4.8 - 5.6 % Final    Comment:    (NOTE) Pre diabetes:          5.7%-6.4% Diabetes:              >6.4% Glycemic control for   <7.0% adults with diabetes     CBG: Recent Labs  Lab 09/06/18 0526 09/06/18 0801 09/06/18 1152 09/06/18 1657 09/06/18 2047  GLUCAP 163* 157* 167* 188* 149*    Review of Systems:   [name] KitchenMarland KitchenReview of Systems  Unable to perform ROS: Intubated     Past Medical History  She,  has a past medical history of Abdominal mass of other site, Cervical compression fracture (HCC), CHF (congestive heart failure) ([place]), Chronic kidney disease, stage 3 (HCC), Chronic lower limb pain, COPD (chronic obstructive pulmonary disease) ([place]), CTS (carpal tunnel syndrome), Depression, Diabetes ([place]), Fibromyalgia, Hepatitis C, Hypercholesteremia, Hypertension, Hypothyroidism, Morbid obesity ([place]), Neuropathy, OSA (obstructive sleep apnea), Osteoarthritis, Renal cancer ([place]), RLS (restless legs syndrome), Syncope, and Venous stasis.   Surgical History    Past Surgical History:  Procedure Laterality Date  . ABDOMINAL HERNIA REPAIR     x2  . ABDOMINAL HYSTERECTOMY    . BLADDER SUSPENSION    . CHOLECYSTECTOMY    . CYST EXCISION     Head  . INCISIONAL HERNIA REPAIR    . KNEE ARTHROSCOPY     Bilateral  . NEPHRECTOMY     Left  . TONSILLECTOMY    . TOOTH EXTRACTION    . UMBILICAL HERNIA REPAIR    . VAGINA SURGERY    . WISDOM TOOTH EXTRACTION       Social History   reports that she quit smoking about 2 years ago. Her smoking use included cigarettes. She has a 112.50 pack-year smoking history. She has never used smokeless tobacco. She reports that she does not drink alcohol or use drugs.   Family History   Her family history includes Alcoholism in her father, mother, and paternal grandfather; Brain cancer in her maternal aunt; Breast cancer in her maternal aunt; COPD (age of onset:  4) in her father; Cancer in her sister; Diabetes in her maternal grandmother and sister; Emphysema in her father, maternal grandfather, and mother; Esophageal varices in her father; Heart attack (age of onset: 64) in her mother; Heart defect in her sister and sister; Heart disease in her maternal grandmother and mother; Lung cancer in her maternal grandfather; Obesity in her son. There is no history of Colon cancer or Esophageal cancer.   Allergies Allergies  Allergen  Reactions  . Gabapentin Anaphylaxis  . Lyrica [Pregabalin] Shortness Of Breath    Trouble breathing  . Ketorolac Tromethamine Hives  . Lisinopril Cough     Home Medications  Prior to Admission medications   Medication Sig Start Date End Date Taking? Authorizing Provider  albuterol (PROAIR HFA) 108 (90 Base) MCG/ACT inhaler Inhale 1-2 puffs into the lungs every 6 (six) hours as needed for wheezing or shortness of breath. 01/20/18  Yes Tanda Rockers, MD  budesonide-formoterol (SYMBICORT) 160-4.5 MCG/ACT inhaler Inhale 2 puffs into the lungs 2 (two) times daily. 06/29/18  Yes Tanda Rockers, MD  clotrimazole-betamethasone (LOTRISONE) cream Apply 1 application topically 2 (two) times daily as needed. Patient taking differently: Apply 1 application topically 2 (two) times daily as needed (yeast).  05/02/18  Yes [name], PA-C  dextromethorphan-guaiFENesin [facility] DM) 30-600 MG 12hr tablet Take 1 tablet by mouth 2 (two) times daily as needed for cough. Patient taking differently: Take 1 tablet by mouth 2 (two) times daily.  06/15/18  Yes Hosie Poisson, MD  diltiazem (CARDIZEM CD) 180 MG 24 hr capsule Take 1 capsule (180 mg total) by mouth daily. 06/02/18  Yes [name], PA-C  glucose blood (ACCU-CHEK AVIVA) test strip Check blood sugars 4 times per day for diabetes. Dx:E11.29 07/24/18  Yes [name], PA-C  insulin glargine (LANTUS) 100 UNIT/ML injection Inject 0.15 mLs (15 Units total) into the skin daily. Patient taking differently: Inject 31 Units into the skin every morning.  07/17/18  Yes Shah, Pratik D, DO  insulin lispro (HUMALOG) 100 UNIT/ML injection Inject 0.15 mLs (15 Units total) into the skin 3 (three) times daily as needed for high blood sugar. Uses sliding scale. Sugar over 200 will give insulin. 07/14/18  Yes [name], PA-C  ipratropium-albuterol (DUONEB) 0.5-2.5 (3) MG/3ML SOLN Take 3 mLs by nebulization 4 (four) times daily. Dx: J44.9 Patient taking  differently: Take 3 mLs by nebulization every 6 (six) hours as needed (shortness of breath).  03/21/18  Yes Parrett, [name] S, NP  levothyroxine (SYNTHROID, LEVOTHROID) 88 MCG tablet TAKE ONE TABLET BY MOUTH DAILY Patient taking differently: Take 88 mcg by mouth every evening.  07/20/18  Yes [name], PA-C  morphine (MS CONTIN) 30 MG 12 hr tablet Take 30 mg by mouth every 12 (twelve) hours. 03/23/18  Yes [provider]  Multiple Vitamins-Minerals (CENTRUM SILVER PO) Take by mouth.   Yes [provider]  nortriptyline (PAMELOR) 25 MG capsule Take 1 capsule (25 mg total) by mouth at bedtime. 06/21/18  Yes [name], PA-C  nystatin (MYCOSTATIN/NYSTOP) powder APPLY TOPICALLY TWICE DAILY AS NEEDED FOR YEAST INFECTION Patient taking differently: Apply 1 g topically 2 (two) times daily as needed (yeast).  09/27/17  Yes [name], PA-C  OXYGEN Inhale 3.5 L into the lungs continuous. O2 3.5L at home.   Yes [provider]  pravastatin (PRAVACHOL) 20 MG tablet TAKE ONE TABLET BY MOUTH EVERY DAY Patient taking differently: Take 20 mg by mouth every  evening.  05/05/18  Yes [name], PA-C  Probiotic Product (PROBIOTIC PO) Take by mouth.   Yes [provider]  torsemide (DEMADEX) 20 MG tablet Take 1 tablet (20 mg total) by mouth daily for 30 days. 07/17/18 08/28/18 Yes Shah, Pratik D, DO  potassium chloride (K-DUR) 10 MEQ tablet Take 1 tablet (10 mEq total) by mouth daily for 30 days. Patient not taking: Reported on 08/28/2018 07/17/18 08/28/18  Heath Lark D, DO   STAFF NOTE  I, Dr Seward Carol have personally reviewed patient's available data, including medical history, events of note, physical examination and test results as part of my evaluation. I have discussed with NP and other care providers such as pharmacist, RN and Elink.  In addition,  I personally evaluated patient please see my note. The patient is critically ill with multiple organ  systems failure and requires high complexity decision making for assessment and support, frequent evaluation and titration of therapies, application of advanced monitoring technologies and extensive interpretation of multiple databases.   Critical Care Time devoted to patient care services described in this note is  Minutes. This time reflects time of care of this signee Dr Seward Carol. This critical care time does not reflect procedure time, or teaching time or supervisory time but could involve care discussion time   Dr. Seward Carol Pulmonary Critical Care Medicine  09/06/2018 11:17 PM   Critical care time: 55 mins

## 2018-09-06 NOTE — Progress Notes (Signed)
[facility] Progress Note Patient Name: JAMESINA GAUGH DOB: 1955-10-21 MRN: [id number]   Date of Service  09/06/2018  HPI/Events of Note  New patient evaluation s/p intubation for acute on chronic respiratory failure.  eICU Interventions  Evaluation completed, sedation with Precedex + soft restraints for safety and to prevent self-extubation.        Frederik Pear 09/06/2018, 10:47 PM

## 2018-09-06 NOTE — Progress Notes (Signed)
Patient transferred to [id number].

## 2018-09-06 NOTE — Progress Notes (Signed)
Patient acting strangely, looking up at ceiling. Tugging for air through mouth. Rapid Response called.

## 2018-09-06 NOTE — Progress Notes (Addendum)
PROGRESS NOTE    Renee Noble  NUU:[id number] DOB: February 04, 1956 DOA: 08/28/2018 PCP: [name], PA-C   Brief Narrative: Patient is a 63 year old female with history of chronic hypoxic respiratory failure on 3 L oxygen at home, severe COPD, chronic diastolic CHF, diabetes type 2, obesity who presents to [facility] with acute on chronic diastolic heart failure, COPD exacerbation.  Patient had 3 to 4 days of progressive shortness of breath and orthopnea.  Patient was transferred to [facility] for COVID rule out.  COVID-19 came out to be negative twice.  On 08/30/2015 hypercapnic with increased work of breathing necessitating intubation and mechanical ventilation.  Extubated to nasal cannula on 09/01/2018.  Currently she is being managed for volume overload from diastolic heart failure.  Physical therapy evaluated her and recommended skilled nursing facility on discharge.  Assessment & Plan:   Principal Problem:   Acute on chronic respiratory failure with hypoxia (HCC) Active Problems:   Type II diabetes mellitus with renal manifestations (HCC)   Chronic pain syndrome   Acute on chronic diastolic CHF (congestive heart failure) (HCC)   Acute on chronic congestive heart failure (HCC)   Hypothyroidism   Acute exacerbation of chronic obstructive pulmonary disease (COPD) (HCC)   Atrial flutter with rapid ventricular response (HCC)   AKI (acute kidney injury) ([place])   Pressure ulcer  Acute on chronic respiratory failure with hypoxia and hypercapnia: Secondary to diastolic CHF, COPD, obesity hypoventilation syndrome, sleep apnea. Her respiratory status has stabilized now.  Denies any shortness of breath.  Was intubated from 4/15-4/17.  Currently on 3 L oxygen via nasal cannula which is her baseline.  Continue bronchodilators as needed.  Suspicion for COVID-19: Ruled out.  Negative tests x2.  Acute on chronic diastolic heart failure: TTE on 11/19 showed ejection fraction of 65%.   Presented with 30 pounds of weight gain.  She is very noncompliant with fluid intake.  Constantly asked for increasing the limit of fluid intake.  She was started on Lasix drip here.  Foley placed which will be removed today.  Diuretics changed to torsemide today.  She was on 20 mg daily at home which will be changed to 80 mg daily. Bilateral lower extremity edema have improved. We will continue to monitor input/output, daily weight.  Fluid restriction to less than 1.5 L a day. She should follow-up with cardiology as an outpatient.  COPD exacerbation: We will discontinue antibiotics.  Continue current bronchodilators.  She is on steroids at home.  Obesity hypoventilation syndrome/sleep apnea: Continues to decline use of nocturnal CPAP.  Paroxysmal A. fib: Currently rate is controlled.  Anticoagulated with Eliquis.  On Cardizem for rate control  AKI on CKD stage III: Baseline creatinine of 1.2.  Creatinine in the range of 1.7 today.  Continue to monitor.  She has H/O  left nephrectomy.  Diabetes mellitus type 2: Continue Lantus and sliding scale insulin  Hypothyroidism: Continue Synthyroid  Pressure injury Left/right ischial tuberosities, present on admission  Debility/deconditioning: Patient evaluated by physical therapy and recommended skilled nursing facility on discharge.      Nutrition Problem: Inadequate oral intake Etiology: inability to eat      DVT prophylaxis: Eliquis Code Status: Full Family Communication: None present at the bedside Disposition Plan: Skilled nursing facility as soon as bed is available   Consultants: PCCM  Procedures: Intubation  Antimicrobials:  Anti-infectives (From admission, onward)   Start     Dose/Rate Route Frequency Ordered Stop   09/02/18 1100  amoxicillin-clavulanate (AUGMENTIN) 875-125 MG per tablet 1 tablet     1 tablet Oral Every 12 hours 09/02/18 1006     08/30/18 2000  cefTRIAXone (ROCEPHIN) 1 g in sodium chloride 0.9 % 100 mL  IVPB  Status:  Discontinued     1 g 200 mL/hr over 30 Minutes Intravenous Every 24 hours 08/30/18 1947 09/02/18 0919   08/30/18 2000  azithromycin (ZITHROMAX) 500 mg in sodium chloride 0.9 % 250 mL IVPB  Status:  Discontinued     500 mg 250 mL/hr over 60 Minutes Intravenous Every 24 hours 08/30/18 1947 09/02/18 0919      Subjective: Patient seen and examined the bedside this morning.  Hemodynamically stable.  Anasarca has improved.  Constantly asked for increasing to limit her fluid intake.  Denies any shortness of breath or chest pain.  Objective: Vitals:   09/06/18 0538 09/06/18 0741 09/06/18 1028 09/06/18 1149  BP: 91/60  120/70 (!) 151/64  Pulse: 76  73 74  Resp: '16  20 20  '$ Temp:      TempSrc:      SpO2: 92% 91% 94% 95%  Weight:      Height:        Intake/Output Summary (Last 24 hours) at 09/06/2018 1339 Last data filed at 09/06/2018 0900 Gross per 24 hour  Intake 1184.37 ml  Output 2250 ml  Net -1065.63 ml   Filed Weights   09/02/18 0500 09/03/18 0500 09/04/18 0500  Weight: (!) 139.1 kg (!) 138.9 kg (!) 143 kg    Examination:  General exam: Not in distress, morbidly obese  HEENT:PERRL,Oral mucosa moist, Ear/Nose normal on gross exam Respiratory system: Bilateral decreased air entry Cardiovascular system: S1 & S2 heard, RRR. No JVD, murmurs, rubs, gallops or clicks. 1-2 +pedal edema. Gastrointestinal system: Abdomen is nondistended, soft and nontender. No organomegaly or masses felt. Normal bowel sounds heard. Central nervous system: Alert and oriented. No focal neurological deficits. Extremities: 1-2 + bilateral pitting  edema, no clubbing ,no cyanosis, distal peripheral pulses palpable. Skin: No rashes, lesions or ulcers,no icterus ,no pallor Chronic venous stasis changes on the lower extremities  Data Reviewed: I have personally reviewed following labs and imaging studies  CBC: Recent Labs  Lab 08/31/18 0330 09/02/18 0111 09/03/18 0710 09/04/18 0334  09/05/18 1042  WBC 6.2 7.9 6.1 5.4 9.3  NEUTROABS  --  7.4  --   --   --   HGB 8.8* 9.5* 9.2* 8.9* 8.6*  HCT 30.7* 34.2* 32.6* 31.5* 30.6*  MCV 84.8 84.9 86.2 85.8 86.0  PLT 86* 84* 68* 63* 67*   Basic Metabolic Panel: Recent Labs  Lab 08/30/18 2018 08/31/18 0330 08/31/18 1521 09/01/18 0500  09/02/18 0111 09/03/18 0710 09/04/18 0334 09/05/18 1042 09/06/18 0602  NA 134* 134* 136  --   --  138 136 136 134* 135  K 5.4* 5.3* 4.9  --   --  4.7 4.5 4.4 4.0 3.9  CL 88* 88* 89*  --   --  89* 87* 87* 84* 85*  CO2 35* 34* 34*  --   --  37* 38* 38* 38* 38*  GLUCOSE 176* 163* 210*  --   --  143* 163* 185* 181* 176*  BUN 73* 80* 93*  --   --  111* 108* 90* 110* 110*  CREATININE 2.73* 2.82* 2.80*  --    < > 2.10* 1.91* 1.93* 1.97* 1.77*  CALCIUM 7.2* 7.4* 7.5*  --   --  8.1* 8.0* 7.9* 7.7* 7.9*  MG 2.2 2.2 2.1 2.3  --   --   --   --   --   --   PHOS 8.0* 5.8* 6.1* 6.5*  --   --   --   --   --   --    < > = values in this interval not displayed.   GFR: Estimated Creatinine Clearance: 45 mL/min (A) (by C-G formula based on SCr of 1.77 mg/dL (H)). Liver Function Tests: No results for input(s): AST, ALT, ALKPHOS, BILITOT, PROT, ALBUMIN in the last 168 hours. No results for input(s): LIPASE, AMYLASE in the last 168 hours. No results for input(s): AMMONIA in the last 168 hours. Coagulation Profile: No results for input(s): INR, PROTIME in the last 168 hours. Cardiac Enzymes: Recent Labs  Lab 09/03/18 2230  TROPONINI 0.04*   BNP (last 3 results) No results for input(s): PROBNP in the last 8760 hours. HbA1C: No results for input(s): HGBA1C in the last 72 hours. CBG: Recent Labs  Lab 09/05/18 1637 09/06/18 0054 09/06/18 0526 09/06/18 0801 09/06/18 1152  GLUCAP 200* 159* 163* 157* 167*   Lipid Profile: No results for input(s): CHOL, HDL, LDLCALC, TRIG, CHOLHDL, LDLDIRECT in the last 72 hours. Thyroid Function Tests: No results for input(s): TSH, T4TOTAL, FREET4, T3FREE,  THYROIDAB in the last 72 hours. Anemia Panel: No results for input(s): VITAMINB12, FOLATE, FERRITIN, TIBC, IRON, RETICCTPCT in the last 72 hours. Sepsis Labs: Recent Labs  Lab 08/31/18 0330 09/01/18 0500  PROCALCITON <0.10 <0.10    Recent Results (from the past 240 hour(s))  MRSA PCR Screening     Status: None   Collection Time: 08/28/18  9:58 PM  Result Value Ref Range Status   MRSA by PCR NEGATIVE NEGATIVE Final    Comment:        The GeneXpert MRSA Assay (FDA approved for NASAL specimens only), is one component of a comprehensive MRSA colonization surveillance program. It is not intended to diagnose MRSA infection nor to guide or monitor treatment for MRSA infections. Performed at [facility], [address] [place], [place] [address]   Respiratory Panel by PCR     Status: None   Collection Time: 08/29/18  7:27 AM  Result Value Ref Range Status   Adenovirus NOT DETECTED NOT DETECTED Final   Coronavirus 229E NOT DETECTED NOT DETECTED Final    Comment: (NOTE) The Coronavirus on the Respiratory Panel, DOES NOT test for the novel  Coronavirus (2019 nCoV)    Coronavirus HKU1 NOT DETECTED NOT DETECTED Final   Coronavirus NL63 NOT DETECTED NOT DETECTED Final   Coronavirus OC43 NOT DETECTED NOT DETECTED Final   Metapneumovirus NOT DETECTED NOT DETECTED Final   Rhinovirus / Enterovirus NOT DETECTED NOT DETECTED Final   Influenza A NOT DETECTED NOT DETECTED Final   Influenza B NOT DETECTED NOT DETECTED Final   Parainfluenza Virus 1 NOT DETECTED NOT DETECTED Final   Parainfluenza Virus 2 NOT DETECTED NOT DETECTED Final   Parainfluenza Virus 3 NOT DETECTED NOT DETECTED Final   Parainfluenza Virus 4 NOT DETECTED NOT DETECTED Final   Respiratory Syncytial Virus NOT DETECTED NOT DETECTED Final   Bordetella pertussis NOT DETECTED NOT DETECTED Final   Chlamydophila pneumoniae NOT DETECTED NOT DETECTED Final   Mycoplasma pneumoniae NOT DETECTED NOT DETECTED Final    Comment:  Performed at [facility], [place] [address] [place], [place] [address]  SARS Coronavirus 2 [facility] order, Performed in [facility] hospital lab)     Status: None  Collection Time: 08/29/18  7:27 AM  Result Value Ref Range Status   SARS Coronavirus 2 NEGATIVE NEGATIVE Final    Comment: (NOTE) If result is NEGATIVE SARS-CoV-2 target nucleic acids are NOT DETECTED. The SARS-CoV-2 RNA is generally detectable in upper and lower  respiratory specimens during the acute phase of infection. The lowest  concentration of SARS-CoV-2 viral copies this assay can detect is 250  copies / mL. A negative result does not preclude SARS-CoV-2 infection  and should not be used as the sole basis for treatment or other  patient management decisions.  A negative result may occur with  improper specimen collection / handling, submission of specimen other  than nasopharyngeal swab, presence of viral mutation(s) within the  areas targeted by this assay, and inadequate number of viral copies  (<250 copies / mL). A negative result must be combined with clinical  observations, patient history, and epidemiological information. If result is POSITIVE SARS-CoV-2 target nucleic acids are DETECTED. The SARS-CoV-2 RNA is generally detectable in upper and lower  respiratory specimens dur ing the acute phase of infection.  Positive  results are indicative of active infection with SARS-CoV-2.  Clinical  correlation with patient history and other diagnostic information is  necessary to determine patient infection status.  Positive results do  not rule out bacterial infection or co-infection with other viruses. If result is PRESUMPTIVE POSTIVE SARS-CoV-2 nucleic acids MAY BE PRESENT.   A presumptive positive result was obtained on the submitted specimen  and confirmed on repeat testing.  While 2019 novel coronavirus  (SARS-CoV-2) nucleic acids may be present in the submitted sample  additional confirmatory testing may  be necessary for epidemiological  and / or clinical management purposes  to differentiate between  SARS-CoV-2 and other Sarbecovirus currently known to infect humans.  If clinically indicated additional testing with an alternate test  methodology [phone]) is advised. The SARS-CoV-2 RNA is generally  detectable in upper and lower respiratory sp ecimens during the acute  phase of infection. The expected result is Negative. Fact Sheet for Patients:  StrictlyIdeas.no Fact Sheet for Healthcare Providers: BankingDealers.co.za This test is not yet approved or cleared by the [place] FDA and has been authorized for detection and/or diagnosis of SARS-CoV-2 by FDA under an Emergency Use Authorization (EUA).  This EUA will remain in effect (meaning this test can be used) for the duration of the COVID-19 declaration under Section 564(b)(1) of the Act, 21 U.S.C. section 360bbb-3(b)(1), unless the authorization is terminated or revoked sooner. Performed at [facility], [place] [address] [place], [place] [address]   Novel Coronavirus, NAA (hospital order; send-out to ref lab)     Status: None   Collection Time: 08/30/18 12:05 PM  Result Value Ref Range Status   SARS-CoV-2, NAA NOT DETECTED NOT DETECTED Final    Comment: (NOTE) This test was developed and its performance characteristics determined by [employer]. This test has not been FDA cleared or approved. This test has been authorized by FDA under an Emergency Use Authorization (EUA). This test has been validated in accordance with the FDA's Guidance Document (Policy for [place] in Laboratories Certified to Perform High Complexity Testing under CLIA prior to Emergency Use Authorization for Coronavirus [id number] during the [facility] Emergency) issued on February 29th, 2020. FDA independent review of this validation is pending. This test is only authorized for  the duration of time the declaration that circumstances exist justifying the authorization of the emergency use of in vitro  diagnostic tests for detection of SARS-CoV- 2 virus and/or diagnosis of COVID-19 infection under section 564(b)(1) of the Act, 21 U.S.C. 263FHL-4(T)(6), unless the authorization is terminated or revoked sooner. Performed At: [facility] [place], [place] [id number] Rush Farmer MD SK:[id number]    [place]  Final    Comment: Performed at [facility] [address] [place], [place] [address]  Culture, respiratory (non-expectorated)     Status: None   Collection Time: 08/30/18  7:47 PM  Result Value Ref Range Status   Specimen Description   Final    ENDOTRACHEAL Performed at [facility] [address], [place], [place] [address]    Special Requests   Final    NONE Performed at [facility], [place] [address], [place], [place] [address]    Gram Stain   Final    ABUNDANT WBC PRESENT,BOTH PMN AND MONONUCLEAR ABUNDANT GRAM POSITIVE RODS    Culture   Final    Consistent with normal respiratory flora. Performed at [facility], [place] [address], [place], [place] [address]    Report Status 09/01/2018 FINAL  Final         Radiology Studies: No results found.      Scheduled Meds: . amoxicillin-clavulanate  1 tablet Oral Q12H  . apixaban  5 mg Oral BID  . chlorhexidine gluconate (MEDLINE KIT)  15 mL Mouth Rinse BID  . Chlorhexidine Gluconate Cloth  6 each Topical Daily  . diltiazem  60 mg Oral Q6H  . fentaNYL (SUBLIMAZE) injection  100 mcg Intravenous Once  . insulin aspart  0-20 Units Subcutaneous TID WC  . insulin aspart  0-5 Units Subcutaneous QHS  . insulin glargine  15 Units Subcutaneous BID  . ipratropium-albuterol  3 mL Nebulization TID  . levothyroxine  88 mcg Oral QAC breakfast  . midazolam  2 mg Intravenous Once  . morphine   15 mg Oral Q12H  . nutrition supplement (JUVEN)  1 packet Oral BID BM  . nystatin   Topical TID  . pantoprazole  40 mg Oral Daily  . pravastatin  20 mg Oral QPM  . predniSONE  20 mg Oral Q breakfast  . senna-docusate  1 tablet Oral Daily  . sodium chloride flush  10-40 mL Intracatheter Q12H  . torsemide  80 mg Oral Daily   Continuous Infusions:   LOS: 9 days    Time spent: 35 mins.More than 50% of that time was spent in counseling and/or coordination of care.      Shelly Coss, MD [facility] Pager [phone]  If 7PM-7AM, please contact night-coverage [email or web address] Password [facility] 09/06/2018, 1:39 PM

## 2018-09-06 NOTE — Progress Notes (Signed)
Pt continues to decline use of cpap at night.  Pt was advised that RT is available all night should she change her mind.

## 2018-09-06 NOTE — Anesthesia Procedure Notes (Signed)
Procedure Name: Intubation Performed by: Gean Maidens, CRNA Pre-anesthesia Checklist: Patient identified, Emergency Drugs available, Suction available, Patient being monitored and Timeout performed Patient Re-evaluated:Patient Re-evaluated prior to induction Oxygen Delivery Method: Circle system utilized Preoxygenation: Pre-oxygenation with 100% oxygen Induction Type: IV induction and Rapid sequence Laryngoscope Size: Glidescope and 4 Grade View: Grade I Tube type: Oral Tube size: 7.0 mm Number of attempts: 1 Placement Confirmation: ETT inserted through vocal cords under direct vision,  positive ETCO2 and breath sounds checked- equal and bilateral Secured at: 23 cm Tube secured with: Tape Dental Injury: Teeth and Oropharynx as per pre-operative assessment

## 2018-09-06 NOTE — NC FL2 (Signed)
[place] MEDICAID FL2 LEVEL OF CARE SCREENING TOOL     IDENTIFICATION  Patient Name: Renee Noble Birthdate: May 07, 1956 Sex: female Admission Date (Current Location): 08/28/2018  [facility] and [place] Number:  Herbalist and Address:  [facility],  [address], [place]      Provider Number: [id number]  Attending Physician Name and Address:  Shelly Coss, MD  Relative Name and Phone Number:  Alfonse Spruce [phone]    Current Level of Care: Hospital Recommended Level of Care: [place] Prior Approval Number:    Date Approved/Denied:   PASRR Number: [id number]  Discharge Plan: SNF    Current Diagnoses: Patient Active Problem List   Diagnosis Date Noted  . Pressure ulcer 08/30/2018  . Acute exacerbation of chronic obstructive pulmonary disease (COPD) ([place]) 08/28/2018  . Atrial flutter with rapid ventricular response ([place]) 08/28/2018  . AKI (acute kidney injury) ([place]) 08/28/2018  . Wound of thigh 08/15/2018  . Pressure injury of skin 07/17/2018  . Hypoglycemia due to insulin 07/14/2018  . Acute on chronic respiratory failure with hypoxia ([place]) 06/09/2018  . HLD (hyperlipidemia) 06/09/2018  . Hypothyroidism 06/09/2018  . Depression 06/09/2018  . Fibromyalgia   . Lung nodule 05/08/2018  . Acute on chronic congestive heart failure ([place])   . Morbid obesity with BMI of 50.0-59.9, adult ([place]) 04/10/2018  . Chronic pain 04/07/2018  . Acute on chronic diastolic CHF (congestive heart failure) ([place]) 04/07/2018  . PNA (pneumonia) 03/21/2018  . COPD (chronic obstructive pulmonary disease) ([place]) 02/25/2018  . Chronic pain syndrome   . Normocytic anemia 02/24/2018  . OSA (obstructive sleep apnea) 08/16/2017  . Morbid obesity due to excess calories ([place]) 04/29/2017  . Cor pulmonale (chronic) ([place]) 04/29/2017  . Chronic respiratory failure with hypoxia and hypercapnia ([place]) 04/28/2017  . CKD (chronic kidney disease),  stage III ([place]) 04/14/2016  . Hyperlipidemia 01/16/2016  . Essential hypertension 01/16/2016  . Hypoxia   . Thrombocytopenia ([place]) 07/12/2015  . Thyroid activity decreased 06/23/2015  . Encounter for chronic pain management 09/02/2014  . Hernia of abdominal cavity 07/30/2014  . Abnormal CXR 02/15/2014  . COPD GOLDIII with min reversibility  12/16/2013  . History of hepatitis C 12/16/2013  . Breast cancer screening 12/16/2013  . Type II diabetes mellitus with renal manifestations ([place]) 12/16/2013  . Screening for osteoporosis 12/16/2013    Orientation RESPIRATION BLADDER Height & Weight     Self, Place, Situation  O2(3L [place]) Indwelling catheter Weight: (!) 143 kg Height:  '5\' 4"'$  (162.6 cm)  BEHAVIORAL SYMPTOMS/MOOD NEUROLOGICAL BOWEL NUTRITION STATUS      Continent Diet(Heart Healthy/Carb Modified)  AMBULATORY STATUS COMMUNICATION OF NEEDS Skin   Extensive Assist Verbally Bruising, Other (Comment)(Moisture under breast, and perineal area. Brusing on arms and Edema LE)                       Personal Care Assistance Level of Assistance  Dressing, Bathing Bathing Assistance: Maximum assistance   Dressing Assistance: Maximum assistance     Functional Limitations Info             SPECIAL CARE FACTORS FREQUENCY  PT (By licensed PT), OT (By licensed OT)     PT Frequency: x5 OT Frequency: x5            Contractures Contractures Info: Not present    Additional Factors Info  Code Status, Allergies(FULL) Code Status Info: fULL Allergies Info: Allergies: Gabapentin, Lyrica Pregabalin, Ketorolac Tromethamine,  Lisinopril   Insulin Sliding Scale Info: (NOVOLOG, LANTUS)       Current Medications (09/06/2018):  This is the current hospital active medication list Current Facility-Administered Medications  Medication Dose Route Frequency Provider Last Rate Last Dose  . acetaminophen (TYLENOL) tablet 650 mg  650 mg Oral Q6H PRN Lenis Noon, RPH   650 mg at 09/04/18  2694   Or  . acetaminophen (TYLENOL) suppository 650 mg  650 mg Rectal Q6H PRN Lenis Noon, RPH      . albuterol (PROVENTIL) (2.5 MG/3ML) 0.083% nebulizer solution 2.5 mg  2.5 mg Nebulization Q4H PRN Chesley Mires, MD   2.5 mg at 09/02/18 1820  . ALPRAZolam Duanne Moron) tablet 0.25 mg  0.25 mg Oral Q8H PRN Barb Merino, MD   0.25 mg at 09/05/18 1338  . apixaban (ELIQUIS) tablet 5 mg  5 mg Oral BID Lenis Noon, RPH   5 mg at 09/06/18 1033  . bisacodyl (DULCOLAX) suppository 10 mg  10 mg Rectal Daily PRN Chesley Mires, MD      . chlorhexidine gluconate (MEDLINE KIT) (PERIDEX) 0.12 % solution 15 mL  15 mL Mouth Rinse BID Cristal Ford, DO   15 mL at 09/06/18 1036  . Chlorhexidine Gluconate Cloth 2 % PADS 6 each  6 each Topical Daily Cristal Ford, DO   6 each at 09/06/18 0900  . dextromethorphan-guaiFENesin (MUCINEX DM) 30-600 MG per 12 hr tablet 1 tablet  1 tablet Oral BID PRN Vertis Kelch, NP   1 tablet at 09/02/18 1448  . [START ON 09/07/2018] diltiazem (CARDIZEM CD) 24 hr capsule 180 mg  180 mg Oral Daily Adhikari, Amrit, MD      . fentaNYL (SUBLIMAZE) injection 100 mcg  100 mcg Intravenous Once Chesley Mires, MD      . insulin aspart (novoLOG) injection 0-20 Units  0-20 Units Subcutaneous TID WC Bodenheimer, Charles A, NP   4 Units at 09/06/18 1202  . insulin aspart (novoLOG) injection 0-5 Units  0-5 Units Subcutaneous QHS Bodenheimer, Charles A, NP      . insulin glargine (LANTUS) injection 15 Units  15 Units Subcutaneous BID Barb Merino, MD   15 Units at 09/06/18 1023  . ipratropium-albuterol (DUONEB) 0.5-2.5 (3) MG/3ML nebulizer solution 3 mL  3 mL Nebulization TID Mariel Aloe, MD      . labetalol (NORMODYNE) injection 10 mg  10 mg Intravenous Q2H PRN Deterding, Guadelupe Sabin, MD   10 mg at 09/02/18 8546  . levothyroxine (SYNTHROID) tablet 88 mcg  88 mcg Oral QAC breakfast Lenis Noon,    88 mcg at 09/06/18 2703  . midazolam (VERSED) injection 2 mg  2 mg Intravenous  Once Chesley Mires, MD      . morphine (MS CONTIN) 12 hr tablet 15 mg  15 mg Oral Q12H Corey Harold, NP   15 mg at 09/06/18 1022  . nutrition supplement (JUVEN) (JUVEN) powder packet 1 packet  1 packet Oral BID BM Mariel Aloe, MD   1 packet at 09/05/18 1611  . nystatin (MYCOSTATIN/NYSTOP) topical powder   Topical TID Manuella Ghazi, Pratik D, DO      . ondansetron [facility]) injection 4 mg  4 mg Intravenous Q6H PRN Vertis Kelch, NP   4 mg at 09/06/18 1202  . pantoprazole (PROTONIX) EC tablet 40 mg  40 mg Oral Daily Barb Merino, MD   40 mg at 09/06/18 1026  . polyethylene glycol (MIRALAX / GLYCOLAX) packet 17 g  17  g Oral Daily PRN Barb Merino, MD      . potassium chloride SA (K-DUR) CR tablet 40 mEq  40 mEq Oral Daily Adhikari, Amrit, MD      . pravastatin (PRAVACHOL) tablet 20 mg  20 mg Oral QPM Barb Merino, MD   20 mg at 09/05/18 2219  . senna-docusate (Senokot-S) tablet 1 tablet  1 tablet Oral Daily Barb Merino, MD   1 tablet at 09/06/18 1026  . simethicone (MYLICON) chewable tablet 80 mg  80 mg Oral QID PRN Deterding, Guadelupe Sabin, MD   80 mg at 09/03/18 2025  . sodium chloride flush (NS) 0.9 % injection 10-40 mL  10-40 mL Intracatheter Q12H Mikhail, [place], DO   10 mL at 09/05/18 2221  . sodium chloride flush (NS) 0.9 % injection 10-40 mL  10-40 mL Intracatheter PRN Cristal Ford, DO   10 mL at 09/05/18 1712  . torsemide (DEMADEX) tablet 80 mg  80 mg Oral Daily Shelly Coss, MD         Discharge Medications: Please see discharge summary for a list of discharge medications.  Relevant Imaging Results:  Relevant Lab Results:   Additional Information SS#[id number]  Purcell Mouton, RN

## 2018-09-06 NOTE — Progress Notes (Signed)
Central line left in place as this is patients only access. Per hospitalist provider pt is to more than likely discharge 09/07/2018.

## 2018-09-06 NOTE — Progress Notes (Signed)
Physical Therapy Treatment Patient Details Name: SAESHA LLERENAS MRN: [id number] DOB: 1956-03-04 Today's Date: 09/06/2018    History of Present Illness Pt admitted through ED 08/28/18 2* acute on chronic respiratory failure.  Pt requiring intubation and extubated 09/01/18.  Pt wtih hx of syncope, DM, Fibromyalgia, CHF, and nephrectomy    PT Comments    Pt in bed on 4 lts nasal.  Pt sleepy/groggy but was able to arouse to name.  Attempted sitting EOB hiowever unable at one assist.  Pt was only able to partially move L LE (pt 5%) and unable to move R LE (pt 0%).  Using bed pad placed under pt, assisted with scooting however unable to complete due to pt effort of only 5%.  "I can't" stated pt.  Attempted side to side rolling using rails however pt required Total Assist to partially complete.   Pt will need a mechanical lift to get OOB.  Returned to supine position, pt quickly a sleep.       Follow Up Recommendations  SNF     Equipment Recommendations  None recommended by PT    Recommendations for Other Services       Precautions / Restrictions Precautions Precautions: Fall Precaution Comments: home O2 Restrictions Weight Bearing Restrictions: No    Mobility  Bed Mobility Overal bed mobility: Needs Assistance Bed Mobility: Supine to Sit;Sit to Supine     Supine to sit: Total assist;+2 for physical assistance;+2 for safety/equipment     General bed mobility comments: attempted to sit pt EOB however unable.  Pt was too sleepy/groggy and was only able to partially move to EOB.    Transfers      unable to perform               Ambulation/Gait                 Stairs             Wheelchair Mobility    Modified Rankin (Stroke Patients Only)       Balance                                            Cognition                                       General Comments: sleepy/groggy       Exercises      General  Comments        Pertinent Vitals/Pain      Home Living                      Prior Function            PT Goals (current goals can now be found in the care plan section) Progress towards PT goals: Progressing toward goals    Frequency    Min 3X/week      PT Plan Current plan remains appropriate    Co-evaluation              AM-PAC PT "6 Clicks" Mobility   Outcome Measure  Help needed turning from your back to your side while in a flat bed without using bedrails?: Total Help needed moving from lying on your back to sitting on the  side of a flat bed without using bedrails?: Total Help needed moving to and from a bed to a chair (including a wheelchair)?: Total Help needed standing up from a chair using your arms (e.g., wheelchair or bedside chair)?: Total Help needed to walk in hospital room?: Total Help needed climbing 3-5 steps with a railing? : Total 6 Click Score: 6    End of Session   Activity Tolerance: Patient limited by fatigue Patient left: in bed;with call bell/phone within reach   PT Visit Diagnosis: Difficulty in walking, not elsewhere classified (R26.2)     Time: 5701-7793 PT Time Calculation (min) (ACUTE ONLY): 13 min  Charges:  $Therapeutic Activity: 8-22 mins                     Rica Koyanagi  PTA Acute  Rehabilitation Services Pager      [phone] Office      [phone]

## 2018-09-06 NOTE — Significant Event (Addendum)
Shift event: RN paged NP stating rapid response had been called due to pt being very lethargic with change in breathing pattern. NP to bedside.  S: Very slow to answer questions, if answers at all. Can not participate in complete ROS due to mental status.  O: Poor appearing morbidly obese female who appears older than stated age. BP 130s. HR 70s Aflutter. RR 12. SaO2 95% 3L. She opens her eyes and did answer correctly that she is in [facility]. No other answers to orientation questions. + difficulty word finding. Eyes roll back at times. Card: irreg irreg with 4/6 SEM. Resp: gasping breathing. Lungs diminished, some rhonchi, some wheezing. Neuro: mental status as above. PEERL. MOE x 4. Grips 5/5 on command. Able to move feet and wiggle toes. No pronator drift.  LEs with pinkish/brown discoloration from fibula to ankles.  A/P: 1. Acute on chronic respiratory failure-toxic. Needs intubation urgently. Pt transferred to ICU and intubated immediately by anesthesia. PCCM is aware and coming to do formal consult. ABG with pH 7.1, PCO2 111, PO2 50s, Bicarb 40. I am sure she is a chronic retainer, but not this high. Is supposed to wear CPAP but doesn't.  Other issues-severe COPD with acute exacerbation, Acute on chronic CHF decompensation, Aflutter with RVR but presently controlled HR, AKI-improving, DM II-stable sugars.   Continue present plan for comorbidities. May need Lasix in loss of Torsemide. She is on Eliquis, which will be on hold right now. Her HR is controlled right now. May need IV Heparin. SCDs for now. Changed meds to IV where appropriate. NPO with SSI. Updated cousin with whom pt lives.  KJKG, NP [facility]  Total critical care time: start 2115, end 2230 for total of 75 mins.  Critical care time was exclusive of separately billable procedures and treating other patients. Critical care was necessary to treat or prevent imminent or life-threatening deterioration. Critical care was time spent  personally by me on the following activities: development of treatment plan with patient and/or surrogate as well as nursing, discussions with consultants, evaluation of patient's response to treatment, examination of patient, obtaining history from patient or surrogate, ordering and performing treatments and interventions, ordering and review of laboratory studies, ordering and review of radiographic studies, pulse oximetry and re-evaluation of patient's condition.

## 2018-09-07 DIAGNOSIS — J9622 Acute and chronic respiratory failure with hypercapnia: Secondary | ICD-10-CM

## 2018-09-07 DIAGNOSIS — Z515 Encounter for palliative care: Secondary | ICD-10-CM

## 2018-09-07 LAB — BASIC METABOLIC PANEL
Anion gap: 11 (ref 5–15)
BUN: 121 mg/dL — ABNORMAL HIGH (ref 8–23)
CO2: 38 mmol/L — ABNORMAL HIGH (ref 22–32)
Calcium: 7.8 mg/dL — ABNORMAL LOW (ref 8.9–10.3)
Chloride: 86 mmol/L — ABNORMAL LOW (ref 98–111)
Creatinine, Ser: 1.73 mg/dL — ABNORMAL HIGH (ref 0.44–1.00)
GFR calc Af Amer: 35 mL/min — ABNORMAL LOW (ref >60)
GFR calc non Af Amer: 30 mL/min — ABNORMAL LOW (ref >60)
Glucose, Bld: 125 mg/dL — ABNORMAL HIGH (ref 70–99)
Potassium: 3.6 mmol/L (ref 3.5–5.1)
Sodium: 135 mmol/L (ref 135–145)

## 2018-09-07 LAB — BASIC METABOLIC PANEL WITH GFR
Anion gap: 12 (ref 5–15)
BUN: 98 mg/dL — ABNORMAL HIGH (ref 8–23)
CO2: 37 mmol/L — ABNORMAL HIGH (ref 22–32)
Calcium: 7.9 mg/dL — ABNORMAL LOW (ref 8.9–10.3)
Chloride: 85 mmol/L — ABNORMAL LOW (ref 98–111)
Creatinine, Ser: 1.7 mg/dL — ABNORMAL HIGH (ref 0.44–1.00)
GFR calc Af Amer: 36 mL/min — ABNORMAL LOW (ref >60)
GFR calc non Af Amer: 31 mL/min — ABNORMAL LOW (ref >60)
Glucose, Bld: 132 mg/dL — ABNORMAL HIGH (ref 70–99)
Potassium: 4.3 mmol/L (ref 3.5–5.1)
Sodium: 134 mmol/L — ABNORMAL LOW (ref 135–145)

## 2018-09-07 LAB — MAGNESIUM
Magnesium: 1.7 mg/dL (ref 1.7–2.4)
Magnesium: 1.8 mg/dL (ref 1.7–2.4)
Magnesium: 1.8 mg/dL (ref 1.7–2.4)

## 2018-09-07 LAB — GLUCOSE, CAPILLARY
Glucose-Capillary: 89 mg/dL (ref 70–99)
Glucose-Capillary: 102 mg/dL — ABNORMAL HIGH (ref 70–99)
Glucose-Capillary: 143 mg/dL — ABNORMAL HIGH (ref 70–99)
Glucose-Capillary: 189 mg/dL — ABNORMAL HIGH (ref 70–99)
Glucose-Capillary: 186 mg/dL — ABNORMAL HIGH (ref 70–99)
Glucose-Capillary: 209 mg/dL — ABNORMAL HIGH (ref 70–99)

## 2018-09-07 LAB — PHOSPHORUS
Phosphorus: 3.6 mg/dL (ref 2.5–4.6)
Phosphorus: 4.2 mg/dL (ref 2.5–4.6)
Phosphorus: 5.8 mg/dL — ABNORMAL HIGH (ref 2.5–4.6)

## 2018-09-07 MED ORDER — FUROSEMIDE 10 MG/ML IJ SOLN
80.0000 mg | Freq: Two times a day (BID) | INTRAMUSCULAR | Status: DC
  Administered 2018-09-07 (×2): 80 mg via INTRAVENOUS
  Filled 2018-09-07 (×2): qty 8

## 2018-09-07 MED ORDER — PRO-STAT SUGAR FREE PO LIQD: 30.0000 mL | Freq: Two times a day (BID) | ORAL | Status: DC

## 2018-09-07 MED ORDER — JUVEN PO PACK
1.0000 | PACK | Freq: Two times a day (BID) | ORAL | Status: DC
  Administered 2018-09-07 – 2018-09-08 (×3): 1
  Filled 2018-09-07 (×5): qty 1

## 2018-09-07 MED ORDER — NOREPINEPHRINE 16 MG/250ML-% IV SOLN
0.0000 ug/min | INTRAVENOUS | Status: DC
  Filled 2018-09-07: qty 250

## 2018-09-07 MED ORDER — VITAL HIGH PROTEIN PO LIQD: 1000.0000 mL | ORAL | Status: DC

## 2018-09-07 MED ORDER — PRO-STAT SUGAR FREE PO LIQD
30.0000 mL | Freq: Every day | ORAL | Status: DC
  Administered 2018-09-07 – 2018-09-08 (×2): 30 mL
  Filled 2018-09-07 (×2): qty 30

## 2018-09-07 MED ORDER — CHLORHEXIDINE GLUCONATE CLOTH 2 % EX PADS
6.0000 | MEDICATED_PAD | Freq: Every day | CUTANEOUS | Status: DC
  Administered 2018-09-09 – 2018-09-15 (×6): 6 via TOPICAL

## 2018-09-07 MED ORDER — VASOPRESSIN 20 UNIT/ML IV SOLN
0.0300 [IU]/min | INTRAVENOUS | Status: DC
  Filled 2018-09-07: qty 2

## 2018-09-07 MED ORDER — VITAL HIGH PROTEIN PO LIQD
1000.0000 mL | ORAL | Status: DC
  Administered 2018-09-07 – 2018-09-08 (×2): 1000 mL

## 2018-09-07 NOTE — Consult Note (Signed)
Consultation Note Date: 09/07/2018   Patient Name: Renee Noble  DOB: [birth year]  MRN: [id number]  Age / Sex: 63 y.o., female  PCP: Delorse Limber Referring Physician: Collene Gobble, MD  Reason for Consultation: Establishing goals of care and Psychosocial/spiritual support  HPI/Patient Profile: 63 y.o. female  with past medical history of renal cell carcinoma (s/p nephrectomy) COPD on 3L at baseline, OSA noncompliant with CPAP, DHF, DM2, chronic pain opioid dependent, who was admitted on 08/28/2018 with what was felt to be a combination of volume overload and COPD exacerbation.  She was found to have new atrial fibrillation with RVR and acute on chronic renal failure.  She was transferred from [facility] to [facility] for [place] rule out.  On 4/15 she was intubated and extubated on 4/17.  She continued to improve until her volume status increase once again and she refused to wear CPAP.  On 4/22 she developed confusion secondary to hypercapnia and required re-intubation. On 4/23 she is unable to wean and sedated on low dose precedex.  Clinical Assessment and Goals of Care:  I have reviewed medical records including EPIC notes, labs and imaging, received report from the bedside CCM RN and Respiratory Therapist assessed the patient and then spoke on the phone with her cousin Renee Noble to discuss diagnosis prognosis, [place], EOL wishes, disposition and options.  I introduced Palliative Medicine as specialized medical care for people living with serious illness. It focuses on providing relief from the symptoms and stress of a serious illness. The goal is to improve quality of life for both the patient and the family.  We discussed a brief life review of the patient.   Renee Noble is estranged from her sister (for 25 years) and her son.  Her cousin Renee Noble asked Renee Noble to come live with her in  [place].  When [name] and her husband moved to [place] 5 years ago, Renee Noble came with them.  Renee Noble described Renee Noble as being very Clinical biochemist" and she does not do what the doctors tell her to do.  As far as functional and nutritional status she was able to walk independently prior to admission.  Renee Noble reports that Renee Noble became confused at times at home.  Renee Noble attributed the confusion to oxygen deficiency as [name] sleeps with her oxygen on but her mouth stays open.  We discussed her current illness and what it means in the larger context of her on-going co-morbidities.  Natural disease trajectory and expectations at EOL were discussed.  Specifically we discussed that she is very ill.  We are hopeful she will come off of the vent.  If she does not wear her CPAP she will certainly crash again quickly however, I'm also concerned that even if she does wear her CPAP her health is very fragile and she may be nearing EOL.  I attempted to elicit values and goals of care important to the patient.  Renee Noble is [name]'s health care decision making surrogate. Renee Noble has clearly told her that if  she is put on a breathing machine she would want to be taken off after two weeks.  Renee Noble emphasized that Renee Noble was very definitive about the time frame.  I thanked Renee Noble for our conversation and committed to update her again tomorrow.  Questions and concerns were addressed.  The family was encouraged to call with questions or concerns.    Primary Decision Maker:  NEXT OF KIN  Cousin Renee Noble if Renee Noble is unable to represent herself.    SUMMARY OF RECOMMENDATIONS    PMT will follow with you.  Patient would not want to extend life support past two weeks.  Code Status/Advance Care Planning:  Full code   Symptom Management:   Per CCM  Additional Recommendations (Limitations, Scope, Preferences):  Full Scope Treatment   Prognosis:  TBD.     Discharge Planning: To Be  Determined      Primary Diagnoses: Present on Admission: . Acute on chronic respiratory failure with hypoxia ([place]) . Acute on chronic diastolic CHF (congestive heart failure) ([place]) . Acute exacerbation of chronic obstructive pulmonary disease (COPD) ([place]) . Atrial flutter with rapid ventricular response ([place]) . AKI (acute kidney injury) ([place]) . Type II diabetes mellitus with renal manifestations ([place]) . Chronic pain syndrome . Hypothyroidism   I have reviewed the medical record, interviewed the patient and family, and examined the patient. The following aspects are pertinent.  Past Medical History:  Diagnosis Date  . Abdominal mass of other site   . Cervical compression fracture ([place])   . CHF (congestive heart failure) ([place])   . Chronic kidney disease, stage 3 (HCC)    Borderline Stage 2-3  . Chronic lower limb pain   . COPD (chronic obstructive pulmonary disease) ([place])   . CTS (carpal tunnel syndrome)   . Depression   . Diabetes ([place])    Type II  . Fibromyalgia   . Hepatitis C    cured last year (2018)  . Hypercholesteremia   . Hypertension   . Hypothyroidism   . Morbid obesity ([place])   . Neuropathy    Diabetes  . OSA (obstructive sleep apnea)   . Osteoarthritis   . Renal cancer ([place])    Left Kidney Removed  . RLS (restless legs syndrome)   . Syncope   . Venous stasis    Social History   Socioeconomic History  . Marital status: Widowed    Spouse name: Not on file  . Number of children: 1  . Years of education: Not on file  . Highest education level: Not on file  Occupational History  . Occupation: Retired  Scientific laboratory technician  . Financial resource strain: Not on file  . Food insecurity:    Worry: Not on file    Inability: Not on file  . Transportation needs:    Medical: Not on file    Non-medical: Not on file  Tobacco Use  . Smoking status: Former Smoker    Packs/day: 2.50    Years: 45.00    Pack years: 112.50    Types: Cigarettes    Last attempt to  quit: 07/21/2016    Years since quitting: 2.1  . Smokeless tobacco: Never Used  Substance and Sexual Activity  . Alcohol use: No  . Drug use: No  . Sexual activity: Never  Lifestyle  . Physical activity:    Days per week: Not on file    Minutes per session: Not on file  . Stress: Not on file  Relationships  . Social  connections:    Talks on phone: Not on file    Gets together: Not on file    Attends religious service: Not on file    Active member of club or organization: Not on file    Attends meetings of clubs or organizations: Not on file    Relationship status: Not on file  Other Topics Concern  . Not on file  Social History Narrative  . Not on file   Family History  Problem Relation Age of Onset  . Heart attack Mother 67       Deceased  . Heart disease Mother   . Emphysema Mother   . Alcoholism Mother   . COPD Father 5       Deceased  . Emphysema Father   . Alcoholism Father   . Esophageal varices Father   . Alcoholism Paternal Grandfather   . Diabetes Maternal Grandmother   . Heart disease Maternal Grandmother   . Lung cancer Maternal Grandfather   . Emphysema Maternal Grandfather   . Brain cancer Maternal Aunt   . Diabetes Sister   . Heart defect Sister   . Cancer Sister        was told her sister had cancer but beat it and dont know which one   . Heart defect Sister   . Obesity Son   . Breast cancer Maternal Aunt   . Colon cancer Neg Hx   . Esophageal cancer Neg Hx    Scheduled Meds: . apixaban  5 mg Oral BID  . arformoterol  15 mcg Nebulization BID  . budesonide (PULMICORT) nebulizer solution  0.5 mg Nebulization BID  . chlorhexidine gluconate (MEDLINE KIT)  15 mL Mouth Rinse BID  . diltiazem  60 mg Oral Q6H  . feeding supplement (PRO-STAT SUGAR FREE 64)  30 mL Per Tube Daily  . feeding supplement (VITAL HIGH PROTEIN)  1,000 mL Per Tube Q24H  . furosemide  80 mg Intravenous Q12H  . insulin aspart  0-15 Units Subcutaneous Q4H  . insulin detemir  15  Units Subcutaneous BID  . ipratropium-albuterol  3 mL Nebulization Q6H  . levothyroxine  44 mcg Intravenous Daily  . mouth rinse  15 mL Mouth Rinse 10 times per day  . nutrition supplement (JUVEN)  1 packet Per Tube BID BM  . nystatin   Topical TID  . pantoprazole (PROTONIX) IV  40 mg Intravenous QHS  . predniSONE  20 mg Per Tube Q breakfast  . sodium chloride flush  10-40 mL Intracatheter Q12H   Continuous Infusions: . dexmedetomidine (PRECEDEX) IV infusion 0.5 mcg/kg/hr (09/07/18 1002)   PRN Meds:.acetaminophen **OR** acetaminophen, albuterol, bisacodyl, docusate, fentaNYL (SUBLIMAZE) injection, fentaNYL (SUBLIMAZE) injection, labetalol, sodium chloride flush Allergies  Allergen Reactions  . Gabapentin Anaphylaxis  . Lyrica [Pregabalin] Shortness Of Breath    Trouble breathing  . Ketorolac Tromethamine Hives  . Lisinopril Cough   Review of Systems intubated and sedated  Physical Exam  Well developed obese female, minimally responsive to my exam, intubated and sedated CV rrr no m/r/g resp no distress, no w/c/r Abdomen soft, nt, nd, +bs Ext UE with multiple bruises, LE with darkened scaling skin.  Vital Signs: BP (!) 99/42   Pulse 69   Temp 97.8 F (36.6 C) (Axillary)   Resp 19   Ht '5\' 4"'$  (1.626 m)   Wt (!) 137.9 kg   SpO2 98%   BMI 52.18 kg/m  Pain Scale: CPOT   Pain Score: 7    SpO2:  SpO2: 98 % O2 Device:SpO2: 98 % O2 Flow Rate: .O2 Flow Rate (L/min): 3 L/min  IO: Intake/output summary:   Intake/Output Summary (Last 24 hours) at 09/07/2018 1051 Last data filed at 09/07/2018 5183 Gross per 24 hour  Intake 455.94 ml  Output 2300 ml  Net -1844.06 ml    LBM: Last BM Date: 08/28/18 Baseline Weight: Weight: 124.3 kg Most recent weight: Weight: (!) 137.9 kg     Palliative Assessment/Data: 10%   Flowsheet Rows     Most Recent Value  Intake Tab  Unit at Time of Referral  ICU  Palliative Care Primary Diagnosis  Pulmonary  Palliative Care Type  New  Palliative care  Reason for referral  Clarify Goals of Care  Date of Admission  08/28/18  Clinical Assessment  Psychosocial & Spiritual Assessment  Palliative Care Outcomes      Time In: 10:15 Time Out: 11:17 Time Total: 62 min. Greater than 50%  of this time was spent counseling and coordinating care related to the above assessment and plan.  Signed by: Florentina Jenny, PA-C Palliative Medicine Pager: [phone]  Please contact Palliative Medicine Team phone at [phone] for questions and concerns.  For individual provider: See Shea Evans

## 2018-09-07 NOTE — Significant Event (Signed)
Rapid Response Event Note  Overview: Time Called: 2110 Arrival Time: 2113 Event Type: Neurologic, Respiratory  Initial Focused Assessment: Patient lying in bed, breathing appears shallow with gasping breaths Patient is able to follow simple commands, grips equal and strong, no arm drift. PERRLA. When patient was not stimulated by touch or voice, patient's eyes would roll back and she would have a glazed over look. Patient currently on 3LNC, oxygen saturation 97-99%. Lung sounds are diminished. HR 70s, aflutter, which is arrhythmia patient has been in. BP 130/81 (95).  Interventions: ABG ordered, per Rapid Response standing orders. Tylene Fantasia, NP paged. Requested NP come to bedside. NP at bedside. CXR ordered, per Rapid Response standing orders.   Plan of Care (if not transferred):  Event Summary: ABG resulted, critical ABG reported off to Tylene Fantasia, NP. Decision made by NP to transfer patient to ICU for intubation.  Family updated of patient's transfer to ICU and of intubation by Tylene Fantasia, NP.   Casimer Bilis

## 2018-09-07 NOTE — Progress Notes (Signed)
..   NAME:  Renee Noble, MRN:  [id number], DOB:  [birth year], LOS: 65 ADMISSION DATE:  08/28/2018, CONSULTATION DATE:  09/06/2018 REFERRING MD:  HOSPITALIST, CHIEF COMPLAINT:  Acute resp distress   Brief History   63 yo F w/ PMHx sig for Severe COPD, chronic respiratory failure on 3 liters oxygen, diastolic CHF, DM II, Hypothyroidism, OSA, Depression, Chronic pain. Admitted on 4/14 for SOB. (COVID negative  4/15 on repeat test) per EMR refusing CPAP at night. Hypercapnic resp failure now intubated. PCCM consulted.     Past Medical History  Chronic respiratory failure, obesity, COPD with 3 and minimal reversibility, chronic pain, diastolic heart dysfunction, hepatitis 3, diabetes type 2, obesity hypoventilation syndrome, obstructive sleep apnea, thyroid disease, thrombocytopenia, stage III CKD,   Significant Hospital Events    08/30/2018,(Patient became hypercapnic with increased work of breathing necessitating intubation and mechanical ventilation. She had drop in blood pressure, received 1 L of fluid and then treated with low-dose Neo-Synephrine.)  Extubated to nasal canula on 09/01/2018. was being followed by PCCM Improved and was transitioned to Hospitalist service on 4/18. 4/22: Per EMR - RT notes state that pt has been refusing NIPPV at night.  lethargic not answering appropiately w/ sig change in breathing pattern. Found to be hypercapnic on ABG. Anesthesia intubated. PCCM consulted to assume care. 4/23: Weaning PEEP and FiO2.  Pushing diuresis, trying to wean sedation.  More awake  Consults:  Anesthesia for intubation >>>>4/22 PCCM>>>> 4/15  Procedures:  Endotracheal Intubation>>>>08/30/2018 CVC Left subclavian>>>>>08/30/2018 Endotracheal intubation >>>>>09/06/2018  Significant Diagnostic Tests:  ABG with pH 7.1, PCO2 111, PO2 50s, Bicarb 40.  Micro Data:  COVID 19 negative on 4/15 ( RULED OUT x 2) MRSA PCR negative Abundant GPRs on resp culture 4/15 Antimicrobials:   Rocephin 4/15 >> completed Zithromax 4/15 >>  completed Augmentin >>4/18 - total of 5 days   Subjective  Sedated on Precedex infusion Objective   Blood pressure (Abnormal) 125/56, pulse 70, temperature 97.8 F (36.6 C), temperature source Axillary, resp. rate (Abnormal) 34, height 5\' 4"  (1.626 m), weight (Abnormal) 137.9 kg, SpO2 96 %.    Vent Mode: PRVC FiO2 (%):  [30 %-40 %] 30 % Set Rate:  [18 bmp-24 bmp] 18 bmp Vt Set:  [430 mL] 430 mL PEEP:  [5 cmH20] 5 cmH20 Plateau Pressure:  [22 cmH20-35 cmH20] 29 cmH20   Intake/Output Summary (Last 24 hours) at 09/07/2018 0851 Last data filed at 09/07/2018 0867 Gross per 24 hour  Intake 815.94 ml  Output 2300 ml  Net -1484.06 ml   Filed Weights   09/03/18 0500 09/04/18 0500 09/07/18 0500  Weight: (Abnormal) 138.9 kg (Abnormal) 143 kg (Abnormal) 137.9 kg    Examination:  General: This is an obese chronically ill-appearing 63 year old female she is currently sedated on a Precedex infusion she will localize  HEENT normocephalic atraumatic orally intubated she does have marked ecchymosis over her anterior chest Pulmonary: Clear to auscultation diminished bases equal chest rise on mechanically assisted breath Cardiac: Regular rate and rhythm with soft systolic murmur which is appreciated over the left sternal border The abdomen is obese, hypoactive bowel sounds Extremities with scattered areas of ecchymosis.  Chronic lower extremity edema with chronic venous stasis changes bilaterally including venous stasis discoloration Neuro: Sedated, localizes but not following commands GU: Clear yellow  Assessment & Plan:  Acute on chronic hypercapnic Resp Failure: multifactorial etiology 2/2 COPD w/ Obesity Hypoventilation syndrome, Pulmonary edema and non compliance with NIPPV therapy -Gold 4 COPD FEV1 30% -BMI  54 -ABG improved on mechanical ventilation -Portable chest x-ray personally reviewed: Endotracheal tubes in satisfactory position.   There is cardiomegaly, bilateral airspace disease most likely pulmonary edema.  Looks like element of left effusion Plan: Continue full ventilator support VAP bundle Continuing scheduled bronchodilators, and inhaled IV Lasix Prednisone taper, started 4/18 will taper over 10 to 14 days PAD protocol with RA SS goal 0 Going to need goals of care discussion as she continues to refuse noninvasive positive pressure ventilation  Acute metabolic encephalopathy secondary to hypercapnia Plan: Continuing PAD protocol Supportive care Minimize sedating medications post extubation   Acute Pulmonary edema due to cardiogenic vs NPPE Pulmonary vascular congestion with blunting of diaphragm on CXR Plan: Continuing IV Lasix Follow-up a.m. chest x-ray  Chronic Aflutter with AV block On Eliquis  Plan: Continue Eliquis  Continue telemetry monitoring  Cont ccb  Acute Kidney Injury Baseline Cr on 3/16 was 1.17 Peaked at 2.82 now 1.77 Intake and output at 4.4 L negative Plan: Avoid nephrotoxins Strict intake output Continue Lasix as BUN/creatinine and blood pressure allow A.m. chemistry  Fluid and electrolyte balance: Mild hyponatremia, hyperphosphatemia Plan Continuing Lasix as mentioned above A.m. chemistry  Mild leukocytosis Suspect secondary to steroids  Plan Trend fever curve  Anemia of critical illness no evidence of bleeding Plan Trend CBC  Stable thrombocytopenia Plan Trend CBC  IDDM now on steroid taper Metabolic syndrome with insulin resistance,  Complicated by steroid induced hyperglycemia Plan: Continue basal and sliding scale insulin     Best practice:  Diet: NPO Pain/Anxiety/Delirium protocol (if indicated): fenatnyl and precedex VAP protocol (if indicated): yes DVT prophylaxis: Eliquis GI prophylaxis: Protonix Glucose control: Levemir w/ ISS Mobility: bedrest while intubated Code Status: FULL Family Communication: has been notified by Hospitalist given  change in clinical condition Disposition: ICU Remains critically ill due to acute on chronic hypercarbic respiratory failure this is primarily a complication of volume overload due to refusal to be compliant with noninvasive nocturnal ventilation leading to decompensated biventricular heart dysfunction and volume overload.  For now we will continue aggressive diuresis.  Continue to support her COPD, she had a recent exacerbation and she is currently getting prednisone taper.  She is going to need a significant goals of care discussion once extubated   Critical care time: 32 min      Erick Colace ACNP-BC [facility] Pager # [phone] OR # [phone] if no answer

## 2018-09-07 NOTE — Progress Notes (Addendum)
Nutrition Follow-up  RD working remotely.   DOCUMENTATION CODES:   Morbid obesity  INTERVENTION:  - will order TF regimen: Vital High Protein @ 60 ml/hr with 30 ml prostat once/day and 1 packet juven BID. - this regimen will provide 1700 kcal, 141 grams protein, 28 grams amino acids, and 1204 ml free water. - free water flush, if desired, to be per MD/NP.    NUTRITION DIAGNOSIS:   Inadequate oral intake related to inability to eat as evidenced by NPO status. -ongoing  GOAL:   Provide needs based on ASPEN/SCCM guidelines -unmet  MONITOR:   Vent status, TF tolerance, Labs, Weight trends, Skin  REASON FOR ASSESSMENT:   Ventilator, Consult Enteral/tube feeding initiation and management  ASSESSMENT:   63 y.o. female with medical history significant for chronic hypoxic respiratory failure on 3L O2, severe COPD, CHF, type 2 DM, morbid obesity, and chronic thrombocytopenia. She was admitted to [facility] on 4/13 with acute on chronic hypoxic respiratory failure in the setting of acute on CHF and acute COPD exacerbation. She had presented to [facility] d/t 3-4 days of worsening SOB, 3-5 lb weight gain x1 week, and a chronic non-productive cough. She noted associated expiratory wheezing over 2-3 days. She reported that her cousin, with whom she lives, was diagnosed with pneumonia within the last week, but has been tested twice for COVID-19, with negative results on both occasions.  Significant Events: 4/13: admitted to [facility] 4/14: transferred to [facility] ICU, tested for COVID-19-results negative x2 4/15: intubated, TF was started via protocol 4/17: extubated, SLP evaluated: recommends regular diet, no limitations  4/19: Transferred from ICU to 4W 4/22: Rapid Response; re-intubated and OGT placed   Used admission weight (124.3 kg) to re-estimate kcal need given that weight is consistent with weight from 06/2018-08/2018. Patient was re-intubated last night by anesthesia following a  rapid response event (extreme lethargy with change in breathing pattern). OGT in place and is currently clamped.   Patient was previously on Heart Healthy/Carb Modified diet and was eating 75% of meals on average. Juven had been ordered BID on 4/21 to aid in wound healing. No documentation of sepsis or concern for sepsis at this time.  Per Pete's note this AM: acute on chronic hypercapnic respiratory failure, will need GOC discussion, acute metabolic encephalopathy 2/2 hypercapnia, acute pulmonary edema, AKI, mild leukocytosis, anemia of critical illness.   Patient is currently intubated on ventilator support MV: 8.3 L/min as of 7:45 AM Temp (24hrs), Avg:97.9 F (36.6 C), Min:97.6 F (36.4 C), Max:98.7 F (37.1 C) Propofol: none  Medications reviewed; 80 mg IV lasix x1 dose 4/23, sliding scale novolog, 15 units levemir BID, 44 mcg IV synthroid/day, 20 mg prednisone/day. Labs reviewed; CBGs: 89 and 102 mg/dl today, Na: 134 mmol/l, Cl: 85 mmol/l, BUN: 98 mg/dl, creatinine: 1.7 mg/dl, Ca: 7.9 mg/dl. Drip; precedex @ 0.7 mcg/kg/hr.     NUTRITION - FOCUSED PHYSICAL EXAM:  unable to complete at this time.  Diet Order:   Diet Order            Diet NPO time specified  Diet effective now              EDUCATION NEEDS:   Not appropriate for education at this time  Skin:  Skin Assessment: Skin Integrity Issues: Skin Integrity Issues:: Stage II Stage II: bilateral IT  Last BM:  4/13  Height:   Ht Readings from Last 1 Encounters:  09/06/18 5\' 4"  (1.626 m)    Weight:  Wt Readings from Last 1 Encounters:  09/07/18 (!) 137.9 kg    Ideal Body Weight:  54.54 kg  BMI:  Body mass index is 52.18 kg/m.  Estimated Nutritional Needs:   Kcal:  1367-1740 kcal  Protein:  >/= 136 grams  Fluid:  1.5L/day     Jarome Matin, MS, RD, LDN, CNSC Inpatient Clinical Dietitian Pager # [phone] After hours/weekend pager # [phone]

## 2018-09-07 NOTE — Progress Notes (Signed)
Sister updated on plan of care  Erick Colace ACNP-BC [place] Pager # [phone] OR # [phone] if no answer

## 2018-09-07 NOTE — TOC Initial Note (Signed)
Transition of Care [facility]) - Initial/Assessment Note    Patient Details  Name: Renee Noble MRN: [id number] Date of Birth: Dec 06, 1955  Transition of Care [facility] CM/SW Contact:    Nila Nephew, LCSW Phone Number: [phone] 09/07/2018, 10:54 AM  Clinical Narrative:     Pt admitted with COPD exacerbation/acute respiratory failure, was intubated again yesterday. Prior to this had been starting to plan DC to SNF for rehab with previous unit case manager.  Spoke with pt's cousin with home she lives, [name]   [phone]. She is aware of pt's status in ICU and available for care planning as pt's case progresses.   Disposition pending clinical outcome.       Patient Goals and CMS Choice    TBD    Expected Discharge Plan and Services     [place]    Prior Living Arrangements/Services      home-with home health                 Activities of Daily Living Home Assistive Devices/Equipment: Environmental consultant (specify type), Eyeglasses, Shower chair with back ADL Screening (condition at time of admission) Patient's cognitive ability adequate to safely complete daily activities?: Yes Is the patient deaf or have difficulty hearing?: No Does the patient have difficulty seeing, even when wearing glasses/contacts?: No(pt wears glasses for reading) Does the patient have difficulty concentrating, remembering, or making decisions?: No Patient able to express need for assistance with ADLs?: Yes Does the patient have difficulty dressing or bathing?: Yes Independently performs ADLs?: No Communication: Independent Dressing (OT): Needs assistance Is this a change from baseline?: Pre-admission baseline Grooming: Needs assistance Is this a change from baseline?: Pre-admission baseline Feeding: Independent Bathing: Needs assistance Is this a change from baseline?: Pre-admission baseline Toileting: Needs assistance Is this a change from baseline?: Pre-admission baseline In/Out Bed: Needs  assistance Is this a change from baseline?: Pre-admission baseline Walks in Home: Needs assistance Is this a change from baseline?: Pre-admission baseline Does the patient have difficulty walking or climbing stairs?: Yes Weakness of Legs: Both Weakness of Arms/Hands: None  Permission Sought/Granted                  Emotional Assessment              Admission diagnosis:  Atrial flutter with rapid ventricular response ([place]) [I48.92] Chronic obstructive pulmonary disease, unspecified COPD type ([place]) [J44.9] Acute on chronic congestive heart failure, unspecified heart failure type ([place]) [I50.9] Patient Active Problem List   Diagnosis Date Noted  . Endotracheal tube present   . Pressure ulcer 08/30/2018  . Acute exacerbation of chronic obstructive pulmonary disease (COPD) ([place]) 08/28/2018  . Atrial flutter with rapid ventricular response ([place]) 08/28/2018  . AKI (acute kidney injury) ([place]) 08/28/2018  . Wound of thigh 08/15/2018  . Pressure injury of skin 07/17/2018  . Hypoglycemia due to insulin 07/14/2018  . Acute on chronic respiratory failure with hypoxia ([place]) 06/09/2018  . HLD (hyperlipidemia) 06/09/2018  . Hypothyroidism 06/09/2018  . Depression 06/09/2018  . Fibromyalgia   . Lung nodule 05/08/2018  . Acute on chronic congestive heart failure ([place])   . Morbid obesity with BMI of 50.0-59.9, adult ([place]) 04/10/2018  . Chronic pain 04/07/2018  . Acute on chronic diastolic CHF (congestive heart failure) ([place]) 04/07/2018  . PNA (pneumonia) 03/21/2018  . COPD, group D, by GOLD 2017 classification ([place]) 02/25/2018  . Chronic pain syndrome   . Normocytic anemia 02/24/2018  . OSA (obstructive sleep apnea) 08/16/2017  .  Morbid obesity due to excess calories ([place]) 04/29/2017  . Cor pulmonale (chronic) ([place]) 04/29/2017  . Chronic respiratory failure with hypoxia and hypercapnia ([place]) 04/28/2017  . CKD (chronic kidney disease), stage III ([place]) 04/14/2016  . Hyperlipidemia  01/16/2016  . Essential hypertension 01/16/2016  . Hypoxia   . Thrombocytopenia ([place]) 07/12/2015  . Thyroid activity decreased 06/23/2015  . Encounter for orogastric (OG) tube placement 09/02/2014  . Hernia of abdominal cavity 07/30/2014  . Abnormal CXR 02/15/2014  . COPD GOLDIII with min reversibility  12/16/2013  . History of hepatitis C 12/16/2013  . Breast cancer screening 12/16/2013  . Type II diabetes mellitus with renal manifestations ([place]) 12/16/2013  . Screening for osteoporosis 12/16/2013   PCP:  [name], PA-C Pharmacy:   [place], [place] - [address] [place] [address] Phone: [phone] Fax: [phone]     Social Determinants of Health (SDOH) Interventions    Readmission Risk Interventions No flowsheet data found.

## 2018-09-08 ENCOUNTER — Ambulatory Visit: Payer: Medicare Other | Admitting: Cardiology

## 2018-09-08 ENCOUNTER — Ambulatory Visit (HOSPITAL_COMMUNITY): Admission: RE | Admit: 2018-09-08 | Payer: Medicare Other | Source: Ambulatory Visit

## 2018-09-08 ENCOUNTER — Inpatient Hospital Stay (HOSPITAL_COMMUNITY): Payer: Medicare Other

## 2018-09-08 ENCOUNTER — Other Ambulatory Visit (HOSPITAL_BASED_OUTPATIENT_CLINIC_OR_DEPARTMENT_OTHER): Payer: Medicare Other

## 2018-09-08 LAB — COMPREHENSIVE METABOLIC PANEL
ALT: 14 U/L (ref 0–44)
AST: 14 U/L — ABNORMAL LOW (ref 15–41)
Albumin: 3 g/dL — ABNORMAL LOW (ref 3.5–5.0)
Alkaline Phosphatase: 37 U/L — ABNORMAL LOW (ref 38–126)
Anion gap: 11 (ref 5–15)
BUN: 126 mg/dL — ABNORMAL HIGH (ref 8–23)
CO2: 42 mmol/L — ABNORMAL HIGH (ref 22–32)
Calcium: 7.7 mg/dL — ABNORMAL LOW (ref 8.9–10.3)
Chloride: 85 mmol/L — ABNORMAL LOW (ref 98–111)
Creatinine, Ser: 1.63 mg/dL — ABNORMAL HIGH (ref 0.44–1.00)
GFR calc Af Amer: 38 mL/min — ABNORMAL LOW (ref >60)
GFR calc non Af Amer: 33 mL/min — ABNORMAL LOW (ref >60)
Glucose, Bld: 246 mg/dL — ABNORMAL HIGH (ref 70–99)
Potassium: 2.8 mmol/L — ABNORMAL LOW (ref 3.5–5.1)
Sodium: 138 mmol/L (ref 135–145)
Total Bilirubin: 0.8 mg/dL (ref 0.3–1.2)
Total Protein: 5 g/dL — ABNORMAL LOW (ref 6.5–8.1)

## 2018-09-08 LAB — CBC
HCT: 25.2 % — ABNORMAL LOW (ref 36.0–46.0)
Hemoglobin: 7.5 g/dL — ABNORMAL LOW (ref 12.0–15.0)
MCH: 24.3 pg — ABNORMAL LOW (ref 26.0–34.0)
MCHC: 29.8 g/dL — ABNORMAL LOW (ref 30.0–36.0)
MCV: 81.6 fL (ref 80.0–100.0)
Platelets: 73 10*3/uL — ABNORMAL LOW (ref 150–400)
RBC: 3.09 MIL/uL — ABNORMAL LOW (ref 3.87–5.11)
RDW: 17.9 % — ABNORMAL HIGH (ref 11.5–15.5)
WBC: 5.1 10*3/uL (ref 4.0–10.5)
nRBC: 0 % (ref 0.0–0.2)

## 2018-09-08 LAB — GLUCOSE, CAPILLARY
Glucose-Capillary: 258 mg/dL — ABNORMAL HIGH (ref 70–99)
Glucose-Capillary: 217 mg/dL — ABNORMAL HIGH (ref 70–99)
Glucose-Capillary: 202 mg/dL — ABNORMAL HIGH (ref 70–99)
Glucose-Capillary: 165 mg/dL — ABNORMAL HIGH (ref 70–99)
Glucose-Capillary: 144 mg/dL — ABNORMAL HIGH (ref 70–99)

## 2018-09-08 LAB — PHOSPHORUS
Phosphorus: 3.3 mg/dL (ref 2.5–4.6)
Phosphorus: 3 mg/dL (ref 2.5–4.6)

## 2018-09-08 LAB — MAGNESIUM
Magnesium: 1.9 mg/dL (ref 1.7–2.4)
Magnesium: 1.7 mg/dL (ref 1.7–2.4)

## 2018-09-08 LAB — BRAIN NATRIURETIC PEPTIDE: B Natriuretic Peptide: 416.5 pg/mL — ABNORMAL HIGH (ref 0.0–100.0)

## 2018-09-08 MED ORDER — JUVEN PO PACK
1.0000 | PACK | Freq: Two times a day (BID) | ORAL | Status: DC
  Administered 2018-09-09 – 2018-09-14 (×2): 1 via ORAL
  Filled 2018-09-08 (×16): qty 1

## 2018-09-08 MED ORDER — ONDANSETRON HCL 4 MG/2ML IJ SOLN
4.0000 mg | Freq: Three times a day (TID) | INTRAMUSCULAR | Status: DC | PRN
  Administered 2018-09-08 – 2018-09-15 (×9): 4 mg via INTRAVENOUS
  Filled 2018-09-08 (×10): qty 2

## 2018-09-08 MED ORDER — DILTIAZEM HCL 60 MG PO TABS
60.0000 mg | ORAL_TABLET | Freq: Four times a day (QID) | ORAL | Status: DC
  Administered 2018-09-08 – 2018-09-11 (×11): 60 mg via ORAL
  Filled 2018-09-08 (×11): qty 1

## 2018-09-08 MED ORDER — DOCUSATE SODIUM 100 MG PO CAPS
100.0000 mg | ORAL_CAPSULE | Freq: Two times a day (BID) | ORAL | Status: DC | PRN
  Administered 2018-09-08 – 2018-09-11 (×2): 100 mg via ORAL
  Filled 2018-09-08 (×2): qty 1

## 2018-09-08 MED ORDER — INSULIN ASPART 100 UNIT/ML ~~LOC~~ SOLN
5.0000 [IU] | SUBCUTANEOUS | Status: DC
  Administered 2018-09-08 – 2018-09-11 (×14): 5 [IU] via SUBCUTANEOUS

## 2018-09-08 MED ORDER — POTASSIUM CHLORIDE 20 MEQ/15ML (10%) PO SOLN
40.0000 meq | ORAL | Status: DC
  Administered 2018-09-08 (×2): 40 meq
  Filled 2018-09-08 (×2): qty 30

## 2018-09-08 MED ORDER — ORAL CARE MOUTH RINSE
15.0000 mL | Freq: Two times a day (BID) | OROMUCOSAL | Status: DC
  Administered 2018-09-09 – 2018-09-15 (×8): 15 mL via OROMUCOSAL

## 2018-09-08 MED ORDER — LEVOTHYROXINE SODIUM 88 MCG PO TABS
88.0000 ug | ORAL_TABLET | Freq: Every day | ORAL | Status: DC
  Administered 2018-09-09 – 2018-09-16 (×8): 88 ug via ORAL
  Filled 2018-09-08 (×8): qty 1

## 2018-09-08 MED ORDER — FUROSEMIDE 10 MG/ML IJ SOLN
80.0000 mg | Freq: Three times a day (TID) | INTRAMUSCULAR | Status: DC
  Administered 2018-09-08 – 2018-09-09 (×3): 80 mg via INTRAVENOUS
  Filled 2018-09-08 (×3): qty 8

## 2018-09-08 MED ORDER — PRO-STAT SUGAR FREE PO LIQD
30.0000 mL | Freq: Every day | ORAL | Status: DC
  Administered 2018-09-09 – 2018-09-15 (×3): 30 mL via ORAL
  Filled 2018-09-08 (×5): qty 30

## 2018-09-08 MED ORDER — LORAZEPAM 2 MG/ML IJ SOLN
0.5000 mg | Freq: Once | INTRAMUSCULAR | Status: AC
  Administered 2018-09-08: 23:00:00 0.5 mg via INTRAVENOUS
  Filled 2018-09-08: qty 1

## 2018-09-08 MED ORDER — POTASSIUM CHLORIDE CRYS ER 10 MEQ PO TBCR
40.0000 meq | EXTENDED_RELEASE_TABLET | Freq: Once | ORAL | Status: AC
  Administered 2018-09-08: 40 meq via ORAL
  Filled 2018-09-08: qty 4

## 2018-09-08 MED ORDER — SORBITOL 70 % SOLN
30.0000 mL | Freq: Once | Status: AC
  Administered 2018-09-08: 22:00:00 30 mL via ORAL
  Filled 2018-09-08: qty 30

## 2018-09-08 MED ORDER — PREDNISONE 20 MG PO TABS
20.0000 mg | ORAL_TABLET | Freq: Every day | ORAL | Status: DC
  Administered 2018-09-09 – 2018-09-12 (×3): 20 mg via ORAL
  Filled 2018-09-08 (×4): qty 1

## 2018-09-08 NOTE — Progress Notes (Signed)
OT Cancellation Note  Patient Details Name: Renee Noble MRN: [id number] DOB: [birth year]   Cancelled Treatment:    Reason Eval/Treat Not Completed: Medical issues which prohibited therapy. Pt is on the vent this am.  Will check back another day.  [name] 09/08/2018, 8:56 AM  Lesle Chris, OTR/L Acute Rehabilitation Services [phone] [facility] pager [phone] office 09/08/2018

## 2018-09-08 NOTE — Progress Notes (Signed)
PT Cancellation Note  Patient Details Name: Renee Noble MRN: [id number] DOB: 1956-02-22   Cancelled Treatment:     Pt unable to tolerate therapy today due to medical decline.  Will attempt another day.  Pt has been evaluated and seen with rec for SNF.  Last PT session pt required [facility] to get OOB as she was unable to transfer standing attempt.    Rica Koyanagi  PTA Acute  Rehabilitation Services Pager      [phone] Office      [phone]

## 2018-09-08 NOTE — Progress Notes (Signed)
[facility] Progress Note Patient Name: Renee Noble DOB: 1956/04/02 MRN: [id number]   Date of Service  09/08/2018  HPI/Events of Note  K+ 2.8  eICU Interventions  Elink K+ replacement protocol        Frederik Pear 09/08/2018, 6:26 AM

## 2018-09-08 NOTE — Progress Notes (Signed)
[facility] Progress Note Patient Name: Renee Noble DOB: [birth year] MRN: [id number]   Date of Service  09/08/2018  HPI/Events of Note  Patient successfully extubated today.  On camera check is stable with sats of 100% on [place] O2.  C/O of nausea and mild anxiety.  eICU Interventions  Plan: PRN for nausea One time order for ativan     Intervention Category Intermediate Interventions: Other:;Thrombocytopenia - evaluation and management Minor Interventions: Routine modifications to care plan (e.g. PRN medications for pain, fever)  Harrel Ferrone 09/08/2018, 7:51 PM

## 2018-09-08 NOTE — Procedures (Signed)
Extubation Procedure Note  Patient Details:   Name: Renee Noble DOB: 07/30/1955 MRN: [id number]   Airway Documentation:    Vent end date: 09/08/18 Vent end time: 1045   Evaluation  O2 sats: stable throughout Complications: No apparent complications Patient did tolerate procedure well. Bilateral Breath Sounds: Diminished   Yes  Tamera Reason 09/08/2018, 10:54 AM

## 2018-09-08 NOTE — Progress Notes (Addendum)
..   NAME:  Renee Noble, MRN:  [id number], DOB:  [birth year], LOS: 58 ADMISSION DATE:  08/28/2018, CONSULTATION DATE:  09/06/2018 REFERRING MD:  HOSPITALIST, CHIEF COMPLAINT:  Acute resp distress   Brief History   63 yo F w/ PMHx sig for Severe COPD, chronic respiratory failure on 3 liters oxygen, diastolic CHF, DM II, Hypothyroidism, OSA, Depression, Chronic pain. Admitted on 4/14 for SOB. (COVID negative  4/15 on repeat test) per EMR refusing CPAP at night. Hypercapnic resp failure now intubated. PCCM consulted.     Past Medical History  Chronic respiratory failure, obesity, COPD with 3 and minimal reversibility, chronic pain, diastolic heart dysfunction, hepatitis 3, diabetes type 2, obesity hypoventilation syndrome, obstructive sleep apnea, thyroid disease, thrombocytopenia, stage III CKD,   Significant Hospital Events    08/30/2018,(Patient became hypercapnic with increased work of breathing necessitating intubation and mechanical ventilation. She had drop in blood pressure, received 1 L of fluid and then treated with low-dose Neo-Synephrine.)  Extubated to nasal canula on 09/01/2018. was being followed by PCCM Improved and was transitioned to Hospitalist service on 4/18. 4/22: Per EMR - RT notes state that pt has been refusing NIPPV at night.  lethargic not answering appropiately w/ sig change in breathing pattern. Found to be hypercapnic on ABG. Anesthesia intubated. PCCM consulted to assume care. 4/23: Weaning PEEP and FiO2.  Pushing diuresis, trying to wean sedation.  More awake 4/24: Weaning, more briskly awake.  Stopped IV sedation Consults:  Anesthesia for intubation >>>>4/22 PCCM>>>> 4/15  Procedures:  Endotracheal Intubation>>>>08/30/2018 CVC Left subclavian>>>>>08/30/2018 Endotracheal intubation >>>>>09/06/2018  Significant Diagnostic Tests:  ABG with pH 7.1, PCO2 111, PO2 50s, Bicarb 40.  Micro Data:  COVID 19 negative on 4/15 ( RULED OUT x 2) MRSA PCR negative  Abundant GPRs on resp culture 4/15 Antimicrobials:  Rocephin 4/15 >> completed Zithromax 4/15 >>  completed Augmentin >>4/18 - total of 5 days   Subjective  Sedated on Precedex infusion Objective   Blood pressure (Abnormal) 124/50, pulse 74, temperature 97.6 F (36.4 C), temperature source Axillary, resp. rate 16, height 5\' 4"  (1.626 m), weight 135.8 kg, SpO2 99 %.    Vent Mode: PSV FiO2 (%):  [30 %-40 %] 30 % Set Rate:  [18 bmp] 18 bmp Vt Set:  [430 mL] 430 mL PEEP:  [5 cmH20] 5 cmH20 Pressure Support:  [5 cmH20] 5 cmH20 Plateau Pressure:  [19 cmH20-27 cmH20] 20 cmH20   Intake/Output Summary (Last 24 hours) at 09/08/2018 1660 Last data filed at 09/08/2018 0847 Gross per 24 hour  Intake 721.35 ml  Output 4650 ml  Net -3928.65 ml   Filed Weights   09/04/18 0500 09/07/18 0500 09/08/18 0401  Weight: (Abnormal) 143 kg (Abnormal) 137.9 kg 135.8 kg    Examination:  General: This is an obese chronically ill-appearing 63 year old female she is currently sedated on a Precedex infusion she will localize  HEENT normocephalic atraumatic orally intubated she does have marked ecchymosis over her anterior chest Pulmonary: Clear to auscultation diminished bases equal chest rise on mechanically assisted breath Cardiac: Regular rate and rhythm with soft systolic murmur which is appreciated over the left sternal border The abdomen is obese, hypoactive bowel sounds Extremities with scattered areas of ecchymosis.  Chronic lower extremity edema with chronic venous stasis changes bilaterally including venous stasis discoloration Neuro: Sedated, localizes but not following commands GU: Clear yellow  Resolved problem list  Leukocytosis  Assessment & Plan:  Acute on chronic hypercapnic Resp Failure: multifactorial etiology 2/2 COPD w/  Obesity Hypoventilation syndrome, Pulmonary edema and non compliance with NIPPV therapy -Gold 4 COPD FEV1 30% -BMI 54 Portable chest x-ray personally reviewed:  This continues to demonstrate bilateral airspace disease right greater than left.  Probable element of pleural effusion. Plan: Pressure support trials  Minimize sedation  VAP bundle  Aggressive titration of sedating medications with assess for extubation either today or tomorrow A.m. chest x-ray   Acute metabolic encephalopathy secondary to hypercapnia Plan: Continuing PAD protocol rass goal 0   Acute Pulmonary edema due to cardiogenic vs NPPE Pulmonary vascular congestion with blunting of diaphragm on CXR Plan: IV Lasix Close observation of blood chemistry  Chronic Aflutter with AV block On Eliquis  Plan: Continue Eliquis, calcium channel blocker and telemetry monitoring   Acute Kidney Injury Baseline Cr on 3/16 was 1.17 Peaked at 2.82 now 1.77 Intake and output at 4.4 L negative Plan: Avoid nephrotoxins  Continuing Lasix  Strict intake and output  A.m. chemistry    Fluid and electrolyte balance: Mild hyponatremia, hyperphosphatemia Plan Continue to push IV Lasix as long as BUN/creatinine and blood pressure tolerate Replace potassium   Anemia of critical illness no evidence of bleeding Plan Repeating CBC in a.m., transfuse for hemoglobin less than 7  Stable thrombocytopenia Plan Intermittent CBC  IDDM now on steroid taper Metabolic syndrome with insulin resistance,  Complicated by steroid induced hyperglycemia Plan: Adjusting basal insulin Continue sliding scale  Hypothyroidism  Plan Cont synthroid   Best practice:  Diet: NPO Pain/Anxiety/Delirium protocol (if indicated): fenatnyl and precedex VAP protocol (if indicated): yes DVT prophylaxis: Eliquis GI prophylaxis: Protonix Glucose control: Levemir w/ ISS Mobility: bedrest while intubated Code Status: FULL Family Communication: has been notified by Hospitalist given change in clinical condition Disposition: ICU She remains critically ill as a consequence of her volume overload.  She looks a  little better today.  Still think we need to get more volume off we will achieve this with escalating her diuretic regimen.  Her biggest issue going forward is going to be compliance with noninvasive at night.  I fear she will end up right back here if she refuses this again   Critical care time: 31 min      Erick Colace ACNP-BC [place] Pager # [phone] OR # [phone] if no answer    Attending Note:  I have examined patient, reviewed labs, studies and notes.   Obese 63 year old woman with recurrent acute on chronic hypoxemic and hypercapnic respiratory failure due to OSA/OHS, COPD, hypertension with diastolic dysfunction, chronic narcotic use due to chronic pain.  She was intubated again on 4/22.  Significant contributor to her recurrent failure is failure to tolerate nightly CPAP/BiPAP.  She has benefited from short-term ventilation, volume removal, now -8.3 L.  Tolerating pressure support ventilation.  Vitals:   09/08/18 0630 09/08/18 0645 09/08/18 0800 09/08/18 0910  BP: (!) 106/33  (!) 124/50   Pulse: 60 63 74   Resp: 18 18 16    Temp:   97.6 F (36.4 C)   TempSrc:   Axillary   SpO2: 100% 93% 98% 99%  Weight:      Height:      Obese woman, chronically ill and ventilated.  She is wide awake, follows commands, nods to questions, moves all extremities.  She is tolerating PS 5 with good lung volumes.  No wheezing, decreased at both bases.  Heart regular with a 2 out of 6 systolic murmur.  Abdomen obese, nondistended with positive bowel sounds.  She  has chronic lower extremity venous stasis changes.  Acute on chronic respiratory failure with hypoxemia and hypercapnia.  Working to address her underlying contributing factors.  Her diastolic CHF has benefited from diuresis, continue as she can tolerate.  She looks ready to extubate.  Most important issue now will be compliance with BiPAP once she is extubated.  We discussed this with her today and will continue to  do so post extubation.  If she cannot tolerate an invasive positive pressure at night then we would have to consider either tracheostomy +/-nocturnal vent, or DNR status.  Independent critical care time is 33 minutes.   Baltazar Apo, MD, PhD 09/08/2018, 10:40 AM [facility] [phone] or if no answer [phone]

## 2018-09-09 ENCOUNTER — Inpatient Hospital Stay (HOSPITAL_COMMUNITY): Payer: Medicare Other

## 2018-09-09 DIAGNOSIS — I4892 Unspecified atrial flutter: Secondary | ICD-10-CM

## 2018-09-09 DIAGNOSIS — Z6841 Body Mass Index (BMI) 40.0 and over, adult: Secondary | ICD-10-CM

## 2018-09-09 DIAGNOSIS — Z794 Long term (current) use of insulin: Secondary | ICD-10-CM

## 2018-09-09 DIAGNOSIS — E1122 Type 2 diabetes mellitus with diabetic chronic kidney disease: Secondary | ICD-10-CM

## 2018-09-09 DIAGNOSIS — N183 Chronic kidney disease, stage 3 (moderate): Secondary | ICD-10-CM

## 2018-09-09 DIAGNOSIS — I5033 Acute on chronic diastolic (congestive) heart failure: Secondary | ICD-10-CM

## 2018-09-09 LAB — COMPREHENSIVE METABOLIC PANEL
ALT: 17 U/L (ref 0–44)
AST: 21 U/L (ref 15–41)
Albumin: 3.4 g/dL — ABNORMAL LOW (ref 3.5–5.0)
Alkaline Phosphatase: 55 U/L (ref 38–126)
Anion gap: 11 (ref 5–15)
BUN: 113 mg/dL — ABNORMAL HIGH (ref 8–23)
CO2: 46 mmol/L — ABNORMAL HIGH (ref 22–32)
Calcium: 8.3 mg/dL — ABNORMAL LOW (ref 8.9–10.3)
Chloride: 81 mmol/L — ABNORMAL LOW (ref 98–111)
Creatinine, Ser: 1.39 mg/dL — ABNORMAL HIGH (ref 0.44–1.00)
GFR calc Af Amer: 46 mL/min — ABNORMAL LOW (ref >60)
GFR calc non Af Amer: 40 mL/min — ABNORMAL LOW (ref >60)
Glucose, Bld: 106 mg/dL — ABNORMAL HIGH (ref 70–99)
Potassium: 3 mmol/L — ABNORMAL LOW (ref 3.5–5.1)
Sodium: 138 mmol/L (ref 135–145)
Total Bilirubin: 0.8 mg/dL (ref 0.3–1.2)
Total Protein: 5.8 g/dL — ABNORMAL LOW (ref 6.5–8.1)

## 2018-09-09 LAB — GLUCOSE, CAPILLARY
Glucose-Capillary: 102 mg/dL — ABNORMAL HIGH (ref 70–99)
Glucose-Capillary: 109 mg/dL — ABNORMAL HIGH (ref 70–99)
Glucose-Capillary: 118 mg/dL — ABNORMAL HIGH (ref 70–99)
Glucose-Capillary: 132 mg/dL — ABNORMAL HIGH (ref 70–99)
Glucose-Capillary: 142 mg/dL — ABNORMAL HIGH (ref 70–99)
Glucose-Capillary: 162 mg/dL — ABNORMAL HIGH (ref 70–99)
Glucose-Capillary: 182 mg/dL — ABNORMAL HIGH (ref 70–99)
Glucose-Capillary: 219 mg/dL — ABNORMAL HIGH (ref 70–99)
Glucose-Capillary: 98 mg/dL (ref 70–99)

## 2018-09-09 LAB — CBC
HCT: 29.8 % — ABNORMAL LOW (ref 36.0–46.0)
Hemoglobin: 8.7 g/dL — ABNORMAL LOW (ref 12.0–15.0)
MCH: 24.2 pg — ABNORMAL LOW (ref 26.0–34.0)
MCHC: 29.2 g/dL — ABNORMAL LOW (ref 30.0–36.0)
MCV: 82.8 fL (ref 80.0–100.0)
Platelets: 87 10*3/uL — ABNORMAL LOW (ref 150–400)
RBC: 3.6 MIL/uL — ABNORMAL LOW (ref 3.87–5.11)
RDW: 18.1 % — ABNORMAL HIGH (ref 11.5–15.5)
WBC: 11.1 10*3/uL — ABNORMAL HIGH (ref 4.0–10.5)
nRBC: 0 % (ref 0.0–0.2)

## 2018-09-09 MED ORDER — MAGNESIUM SULFATE 2 GM/50ML IV SOLN
2.0000 g | Freq: Once | INTRAVENOUS | Status: AC
  Administered 2018-09-09: 2 g via INTRAVENOUS
  Filled 2018-09-09: qty 50

## 2018-09-09 MED ORDER — FUROSEMIDE 10 MG/ML IJ SOLN
40.0000 mg | Freq: Two times a day (BID) | INTRAMUSCULAR | Status: DC
  Administered 2018-09-09 – 2018-09-11 (×5): 40 mg via INTRAVENOUS
  Filled 2018-09-09 (×5): qty 4

## 2018-09-09 MED ORDER — LORAZEPAM 2 MG/ML IJ SOLN
INTRAMUSCULAR | Status: AC
  Administered 2018-09-09: 03:00:00 1 mg via INTRAVENOUS
  Filled 2018-09-09: qty 1

## 2018-09-09 MED ORDER — LORAZEPAM 2 MG/ML IJ SOLN
1.0000 mg | Freq: Once | INTRAMUSCULAR | Status: AC
  Administered 2018-09-09: 1 mg via INTRAVENOUS

## 2018-09-09 MED ORDER — POTASSIUM CHLORIDE CRYS ER 20 MEQ PO TBCR
40.0000 meq | EXTENDED_RELEASE_TABLET | ORAL | Status: AC
  Administered 2018-09-09 (×2): 40 meq via ORAL
  Filled 2018-09-09 (×2): qty 2

## 2018-09-09 MED ORDER — LORAZEPAM 2 MG/ML IJ SOLN
1.0000 mg | Freq: Once | INTRAMUSCULAR | Status: AC
  Administered 2018-09-09: 1 mg via INTRAVENOUS
  Filled 2018-09-09: qty 1

## 2018-09-09 NOTE — Evaluation (Signed)
Clinical/Bedside Swallow Evaluation Patient Details  Name: Renee Noble MRN: [id number] Date of Birth: 30-Sep-1955  Today's Date: 09/09/2018 Time: SLP Start Time (ACUTE ONLY): 41 SLP Stop Time (ACUTE ONLY): 1735 SLP Time Calculation (min) (ACUTE ONLY): 25 min  Past Medical History:  Past Medical History:  Diagnosis Date  . Abdominal mass of other site   . Cervical compression fracture ([place])   . CHF (congestive heart failure) ([place])   . Chronic kidney disease, stage 3 (HCC)    Borderline Stage 2-3  . Chronic lower limb pain   . COPD (chronic obstructive pulmonary disease) ([place])   . CTS (carpal tunnel syndrome)   . Depression   . Diabetes ([place])    Type II  . Fibromyalgia   . Hepatitis C    cured last year (2018)  . Hypercholesteremia   . Hypertension   . Hypothyroidism   . Morbid obesity ([place])   . Neuropathy    Diabetes  . OSA (obstructive sleep apnea)   . Osteoarthritis   . Renal cancer ([place])    Left Kidney Removed  . RLS (restless legs syndrome)   . Syncope   . Venous stasis    Past Surgical History:  Past Surgical History:  Procedure Laterality Date  . ABDOMINAL HERNIA REPAIR     x2  . ABDOMINAL HYSTERECTOMY    . BLADDER SUSPENSION    . CHOLECYSTECTOMY    . CYST EXCISION     Head  . INCISIONAL HERNIA REPAIR    . KNEE ARTHROSCOPY     Bilateral  . NEPHRECTOMY     Left  . TONSILLECTOMY    . TOOTH EXTRACTION    . UMBILICAL HERNIA REPAIR    . VAGINA SURGERY    . WISDOM TOOTH EXTRACTION     HPI:  Patient is a 63 y.o. female with PMH: fibromyalgia, CHF, nephrectomy, chronic hypoxic respiratory failure on 3.5 L continuous nasal cannula O2 at home for several years, obesity, DM-2, chronic diastolic heart failure, who presented to ED at [facility] with c/o SOB. for 3-4 days prior. She was tested twice for Covid with results negative both times. She became hypercapnic with increased WOB and was intubated on 4/15, extubated morning of 4/17.  Passed swallow  evaluation.  Became hyerpcanpnic and refused CPAP usage thus required repeat intubation 4/22-4/24/2020.  Swallow evaluation reordered.    Assessment / Plan / Recommendation Clinical Impression  Patient presents with an oropharyngeal swallow that is WFL-WNL without any overt s/s of aspiration or penetration and no changes in vitals (respiratory rate, heart rate, SpO2%) while consuming thin liquids or regular solids.  Patient's swallow appeared timely without indication of residuals.  Of note, pt does report problems with indigestion and thus evaluation was limited when she declined further intake. SLP also questions if pt may be having reflux contributing to pulmonary issues.  Cervical spine osteophytes present from C3-C7 per imaging 08/2015 which may narrow pharyngeal space and prevent adequate epiglottic deflection.  Pt denies sensation of residuals nor expectorating food/drink.  Although patient's swallow appears functional to WNL as per this assessment, as she has decreased respiratory functioning, she continues to be at risk of aspiration which could occur when fatigued or if dyspneic during intake. SLP educated her to findings and recommendations.  Will follow up x1 given reintubation during this admit with some hoarseness and cervical osteophytes possibly impacting swallow.   SLP Visit Diagnosis: Dysphagia, unspecified (R13.10)    Aspiration Risk  Mild aspiration risk;No limitations  Diet Recommendation Thin liquid;Regular(when advance)   Liquid Administration via: Cup;Straw Medication Administration: Whole meds with liquid Supervision: Patient able to self feed Compensations: Minimize environmental distractions;Small sips/bites;Slow rate;Other (Comment)(strict esophageal precautions) Postural Changes: Seated upright at 90 degrees;Remain upright for at least 30 minutes after po intake    Other  Recommendations Oral Care Recommendations: Oral care BID   Follow up Recommendations None       Frequency and Duration min 1 x/week  1 week       Prognosis    Good    Swallow Study   General Date of Onset: 09/01/18 HPI: Patient is a 63 y.o. female with PMH: fibromyalgia, CHF, nephrectomy, chronic hypoxic respiratory failure on 3.5 L continuous nasal cannula O2 at home for several years, obesity, DM-2, chronic diastolic heart failure, who presented to ED at [facility] with c/o SOB. for 3-4 days prior. She was tested twice for Covid with results negative both times. She became hypercapnic with increased WOB and was intubated on 4/15, extubated morning of 4/17.  Passed swallow evaluation.  Became hyerpcanpnic and refused CPAP usage thus required repeat intubation 4/22-4/24/2020.  Swallow evaluation reordered.  Type of Study: Bedside Swallow Evaluation Previous Swallow Assessment: BSE 09/01/2018 Diet Prior to this Study: Thin liquids(clear liquids) Temperature Spikes Noted: No Respiratory Status: Nasal cannula History of Recent Intubation: Yes(twice 4/14-4/17, 4/22-4/24) Length of Intubations (days): 3 days Date extubated: 09/01/18 Behavior/Cognition: Alert;Cooperative;Pleasant mood Oral Care Completed by SLP: No Oral Cavity - Dentition: Dentures, top;Dentures, bottom Vision: Functional for self-feeding Self-Feeding Abilities: Able to feed self Patient Positioning: Upright in bed Baseline Vocal Quality: Hoarse;Low vocal intensity Volitional Cough: Weak Volitional Swallow: Able to elicit    Oral/Motor/Sensory Function Overall Oral Motor/Sensory Function: Within functional limits   Ice Chips Ice chips: Not tested   Thin Liquid Thin Liquid: Within functional limits Presentation: Straw Other Comments: did not conduct yale 3 ounce water test as pt was nauseated, but no indications of airway compromise or dysphagia with gingerale, swallow appeared timely    Nectar Thick Nectar Thick Liquid: Not tested   Honey Thick Honey Thick Liquid: Not tested   Puree Puree: Not tested    Solid     Solid: Within functional limits Presentation: Self Fed Other Comments: No increased RR or HR or decreased SpO2 during intake and pt with protective exhalation post-swallow      Renee Noble 09/09/2018,6:37 PM    Renee Noble, [facility] SLP [place] Pager [phone] Office [phone]

## 2018-09-09 NOTE — Progress Notes (Signed)
NAME:  Renee Noble, MRN:  [id number], DOB:  [birth year], LOS: 88 ADMISSION DATE:  08/28/2018, CONSULTATION DATE:  09/06/2018 REFERRING MD:  HOSPITALIST, CHIEF COMPLAINT:  Acute resp distress   Brief History   63 yo F w/ PMHx sig for Severe COPD, chronic respiratory failure on 3 liters oxygen, diastolic CHF, DM II, Hypothyroidism, OSA, Depression, Chronic pain. Admitted on 4/14 for SOB. (COVID negative  4/15 on repeat test) per EMR refusing CPAP at night. Hypercapnic resp failure now intubated. PCCM consulted.    Past Medical History  Chronic respiratory failure, obesity, COPD with 3 and minimal reversibility, chronic pain, diastolic heart dysfunction, hepatitis 3, diabetes type 2, obesity hypoventilation syndrome, obstructive sleep apnea, thyroid disease, thrombocytopenia, stage III CKD,  Significant Hospital Events    08/30/2018,(Patient became hypercapnic with increased work of breathing necessitating intubation and mechanical ventilation. She had drop in blood pressure, received 1 L of fluid and then treated with low-dose Neo-Synephrine.)  Extubated to nasal canula on 09/01/2018. was being followed by P[facility]M [facility] and was transitioned to Hospitalist service on 4/18. 4/22: Per EMR - RT notes state that pt has been refusing NIPPV at night.  lethargic not answering appropiately w/ sig change in breathing pattern. Found to be hypercapnic on ABG. Anesthesia intubated. PCCM consulted to assume care. 4/23: Weaning PEEP and FiO2.  Pushing diuresis, trying to wean sedation.  More awake 4/24: Weaning, more briskly awake.  Stopped IV sedation, EXTUBATED  4/25: transfer to SDU   Consults:  Anesthesia for intubation >>>>4/22 PCCM>>>> 4/15  Procedures:  Endotracheal Intubation>>>>08/30/2018 CVC Left subclavian>>>>>08/30/2018 Endotracheal intubation >>>>>09/06/2018  Significant Diagnostic Tests:  ABG with pH 7.1, PCO2 111, PO2 50s, Bicarb 40.  Micro Data:  COVID 19 negative on 4/15 ( RULED OUT x  2) MRSA PCR negative Abundant GPRs on resp culture 4/15  Antimicrobials:  Rocephin 4/15 >> completed Zithromax 4/15 >>  completed Augmentin >>4/18 - total of 5 days   Subjective  Extubated yesterday. Doing well today. No issues following extubation. Good UOP.   Objective   Blood pressure (!) 167/57, pulse 100, temperature 98 F (36.7 C), temperature source Oral, resp. rate (!) 23, height 5\' 4"  (1.626 m), weight 129.4 kg, SpO2 96 %.        Intake/Output Summary (Last 24 hours) at 09/09/2018 0953 Last data filed at 09/09/2018 0522 Gross per 24 hour  Intake 653.92 ml  Output 7875 ml  Net -7221.08 ml   Filed Weights   09/07/18 0500 09/08/18 0401 09/09/18 0500  Weight: (!) 137.9 kg 135.8 kg 129.4 kg    Examination: General: Obese FM, chronically ill, sitting up in bed watching TV  HEENT: NCAT Neck: large size Pulmonary: diminshed in the BL bases, no crackles no wheeze Cardiac: RRR, no mrg, s1 s2  Abd: soft, obese pannus, nt nd  Extremities: ecchymosis, chronic venous stasis changes, minimal edema  Neuro: alert, oriented, following commands, no deficit  GU: clear urine, foley in place   Resolved problem list  Leukocytosis Acute metabolic encephalopathy secondary to hypercapnia Acute Pulmonary edema due to cardiogenic vs NPPE  Assessment & Plan:   Acute on chronic hypercapnic Resp Failure: multifactorial etiology 2/2 COPD w/ Obesity Hypoventilation syndrome, Pulmonary edema and non compliance with NIPPV therapy -Gold 4 COPD FEV1 30% -BMI 54 - CXR - BL effusion, congestions, The patient's images have been independently reviewed by me.   - 2019 CT Chest new lung nodule - follow up recs in Jun 2020 Plan: PAP therapy QHS is  a must  Wean Fio2 goal sat >88%  Chronic Aflutter with AV block On Eliquis  Plan: Continue eliquis and CCB for rate control   Acute Kidney Injury, positive CFB  Plan: Avoid nephrotoxins  Continue diuresis, decreased to 40mg  Lasix IV BID  Follow  UOP   Fluid and electrolyte balance: Mild hyponatremia, hyperphosphatemia, hypokalemia  Monitor and replace as needed   Anemia of critical illness no evidence of bleeding Plan Conservative transfusion threshold Observe   Thrombocytopenia Plan Follow  IDDM now on steroid taper Metabolic syndrome with insulin resistance,  Complicated by steroid induced hyperglycemia Plan: Basal bolus insulin regimen  Titrate dosing to goal   Hypothyroidism  Plan Cont synthroid   RUL Pulmonary Nodule  Recommend follow up CT in June/July 2020  Best practice:  Diet: NPO Pain/Anxiety/Delirium protocol (if indicated): fenatnyl and precedex VAP protocol (if indicated): yes DVT prophylaxis: Eliquis GI prophylaxis: Protonix Glucose control: Levemir w/ ISS Mobility: bedrest while intubated Code Status: FULL Family Communication: has been notified by Hospitalist given change in clinical condition Disposition: transfer to hospitalist - [place] service  - have paged [place], [facility] 09/09/2018 9:53 AM  Personal pager: [phone] If unanswered, please page CCM On-call: [phone]

## 2018-09-09 NOTE — Progress Notes (Signed)
Physical Therapy Treatment Patient Details Name: Renee Noble MRN: [id number] DOB: May 19, 1955 Today's Date: 09/09/2018    History of Present Illness Pt admitted through ED 08/28/18 2* acute on chronic respiratory failure.  Pt requiring intubation and extubated 09/01/18. Intubated 09/06/18 due to evolving hypercapnic respiratory failure and extubated 09/08/18. Pt wtih hx of syncope, DM, Fibromyalgia, CHF, and nephrectomy    PT Comments    Pt in bed on on 4lts nasal at 99%, HR 88 and BP 143/62 with RR 16. Co Tx with OT and also utilized [employer] during session.  Assisted pt to EOB. General bed mobility comments: HOB up, pt with increased time, pt doing ~15% of getting legs off of bed and ~10% of UB movement.  EOB vitals BP 185/63, HR 94, sats 97% with RR 18.  Attempted sit to stand + 2 side bt side assist and Bari walker.  General transfer comment: pt ~15% of sit>stand from bed x2 with unable to get hips fully off of bed.  Used [employer] to assist to [employer].  Positioned to comfort.  RN arrived.  OOB at 10:30am.  Will attempt to have pt stay in recliner 4 hours.   Follow Up Recommendations  SNF     Equipment Recommendations  None recommended by PT    Recommendations for Other Services       Precautions / Restrictions Precautions Precautions: Fall Precaution Comments: home O2 (3.5 liters at rest, 4 with activity per pt) Restrictions Weight Bearing Restrictions: No    Mobility  Bed Mobility Overal bed mobility: Needs Assistance Bed Mobility: Supine to Sit     Supine to sit: Total assist;+2 for physical assistance;+2 for safety/equipment     General bed mobility comments: HOB up, pt with increased time, pt doing ~15% of getting legs off of bed and ~10% of UB movement  Transfers Overall transfer level: Needs assistance Equipment used: Rolling walker (2 wheeled) Transfers: Sit to/from Stand Sit to Stand: From elevated surface;+2 physical assistance;+2 safety/equipment;Total  assist         General transfer comment: pt ~15% of sit>stand from bed x2 with unable to get hips fully off of bed  Ambulation/Gait             General Gait Details: unable to achieve upright posture and unable to clear hips off bed  this session    Stairs             Wheelchair Mobility    Modified Rankin (Stroke Patients Only)       Balance Overall balance assessment: Needs assistance Sitting-balance support: Bilateral upper extremity supported;Feet supported Sitting balance-Leahy Scale: Poor Sitting balance - Comments: Pt with tendency for posterior lean. Did better with having RW to hold onto in front of her. Sat EOB ~10 minutes overall with min A to minguard A and intermittent VCs to lean forward       Standing balance comment: unable today                            Cognition Arousal/Alertness: Awake/alert Behavior During Therapy: WFL for tasks assessed/performed Overall Cognitive Status: Within Functional Limits for tasks assessed                                 General Comments: more engaging      Exercises      General Comments  Pertinent Vitals/Pain Pain Assessment: Faces Faces Pain Scale: Hurts even more Pain Location: back and neck/chronic; Bil knees Pain Descriptors / Indicators: Sore;Aching;Constant Pain Intervention(s): Monitored during session;Repositioned    Home Living                      Prior Function            PT Goals (current goals can now be found in the care plan section) Progress towards PT goals: Progressing toward goals    Frequency    Min 3X/week      PT Plan Current plan remains appropriate    Co-evaluation PT/OT/SLP Co-Evaluation/Treatment: Yes Reason for Co-Treatment: For patient/therapist safety PT goals addressed during session: Mobility/safety with mobility;Balance;Strengthening/ROM OT goals addressed during session: Strengthening/ROM      AM-PAC  PT "6 Clicks" Mobility   Outcome Measure  Help needed turning from your back to your side while in a flat bed without using bedrails?: Total Help needed moving from lying on your back to sitting on the side of a flat bed without using bedrails?: Total Help needed moving to and from a bed to a chair (including a wheelchair)?: Total Help needed standing up from a chair using your arms (e.g., wheelchair or bedside chair)?: Total Help needed to walk in hospital room?: Total Help needed climbing 3-5 steps with a railing? : Total 6 Click Score: 6    End of Session Equipment Utilized During Treatment: Oxygen;Gait belt Activity Tolerance: Patient limited by fatigue Patient left: in chair;with call bell/phone within reach Nurse Communication: Mobility status;Need for lift equipment PT Visit Diagnosis: Difficulty in walking, not elsewhere classified (R26.2)     Time: 1015-1040 PT Time Calculation (min) (ACUTE ONLY): 25 min  Charges:  $Therapeutic Activity: 23-37 mins                     Rica Koyanagi  PTA Acute  Rehabilitation Services Pager      [phone] Office      [phone]

## 2018-09-09 NOTE — Progress Notes (Signed)
RN placed patient back on BIPAP  

## 2018-09-09 NOTE — Progress Notes (Signed)
[facility] [place] Progress Note Patient Name: Renee Noble DOB: 16-Dec-1955 MRN: [id number]   Date of Service  09/09/2018  HPI/Events of Note  Hypokalemia and hypomag  eICU Interventions  Potassium and Mag replaced     Intervention Category Intermediate Interventions: Electrolyte abnormality - evaluation and management  Axxel Gude 09/09/2018, 4:28 AM

## 2018-09-09 NOTE — Progress Notes (Signed)
Occupational Therapy Treatment Patient Details Name: Renee Noble MRN: [id number] DOB: April 06, 1956 Today's Date: 09/09/2018    History of present illness Pt admitted through ED 08/28/18 2* acute on chronic respiratory failure.  Pt requiring intubation and extubated 09/01/18. Intubated 09/06/18 due to evolving hypercapnic respiratory failure and extubated 09/08/18. Pt wtih hx of syncope, DM, Fibromyalgia, CHF, and nephrectomy   OT comments  This 63 yo female admitted with above presents to acute OT tolerating good amount of activity today given she was just extubated yesterday. Worked on bed mobility, sitting balance and sit<>stands (all as precursors to basic ADLs). End of session we were able to lift pt into recliner with maxi sky so she could be OOB. Vital signs stable throughout session.  Follow Up Recommendations  SNF;Supervision/Assistance - 24 hour    Equipment Recommendations  Other (comment)(TBD next venue)       Precautions / Restrictions Precautions Precautions: Fall Precaution Comments: home O2 (3.5 liters at rest, 4 with activity per pt) Restrictions Weight Bearing Restrictions: No       Mobility Bed Mobility Overal bed mobility: Needs Assistance Bed Mobility: Supine to Sit     Supine to sit: Total assist;+2 for physical assistance;+2 for safety/equipment     General bed mobility comments: HOB up, pt with increased time, pt doing ~15% of getting legs off of bed and ~10% of UB movement  Transfers Overall transfer level: Needs assistance Equipment used: Rolling walker (2 wheeled)(bariatric) Transfers: Sit to/from Stand Sit to Stand: From elevated surface;+2 physical assistance;+2 safety/equipment;Total assist         General transfer comment: pt ~15% of sit>stand from bed x2 with unable to get hips fully off of bed    Balance Overall balance assessment: Needs assistance Sitting-balance support: Bilateral upper extremity supported;Feet supported Sitting  balance-Leahy Scale: Poor Sitting balance - Comments: Pt with tendency for posterior lean. Did better with having RW to hold onto in front of her. Sat EOB ~10 minutes overall with min A to minguard A and intermittent VCs to lean forward       Standing balance comment: unable today                                  Vision Patient Visual Report: No change from baseline            Cognition Arousal/Alertness: Awake/alert Behavior During Therapy: WFL for tasks assessed/performed Overall Cognitive Status: Within Functional Limits for tasks assessed                                                     Pertinent Vitals/ Pain       Pain Assessment: Faces Faces Pain Scale: Hurts even more Pain Location: back and neck/chronic; Bil knees Pain Descriptors / Indicators: Sore;Aching;Constant Pain Intervention(s): Limited activity within patient's tolerance;Monitored during session;Repositioned         Frequency  Min 2X/week        Progress Toward Goals  OT Goals(current goals can now be found in the care plan section)  Progress towards OT goals: Not progressing toward goals - comment(due to has had a medical set back since last session (was re-intubated))     Plan Discharge plan remains appropriate    Co-evaluation    PT/OT/SLP  Co-Evaluation/Treatment: Yes Reason for Co-Treatment: For patient/therapist safety PT goals addressed during session: Mobility/safety with mobility;Balance;Proper use of DME;Strengthening/ROM OT goals addressed during session: Strengthening/ROM      AM-PAC OT "6 Clicks" Daily Activity     Outcome Measure   Help from another person eating meals?: None Help from another person taking care of personal grooming?: A Lot Help from another person toileting, which includes using toliet, bedpan, or urinal?: Total Help from another person bathing (including washing, rinsing, drying)?: A Lot Help from another person to  put on and taking off regular upper body clothing?: A Lot Help from another person to put on and taking off regular lower body clothing?: Total 6 Click Score: 12    End of Session Equipment Utilized During Treatment: Oxygen(4 liters)  OT Visit Diagnosis: Muscle weakness (generalized) (M62.81);Pain Pain - part of body: (lower back, neck, knees)   Activity Tolerance Patient tolerated treatment well(did report she was getting tired)   Patient Left in chair;with call bell/phone within reach;with chair alarm set   Nurse Communication Mobility status;Need for lift equipment        Time: 3846-6599 OT Time Calculation (min): 31 min  Charges: OT Treatments $Therapeutic Activity: 8-22 mins  [place] Circle, OTR/L Acute [employer] Pager [phone] Office [phone]      Almon Register 09/09/2018, 11:52 AM

## 2018-09-09 NOTE — Progress Notes (Signed)
Pt experiencing productive cough  @ 2309pm pt coughed up a moderate amount of  frank red sputum.  Appeared to be blood mixed w/ mucous  On-call Elink notified

## 2018-09-09 NOTE — Progress Notes (Signed)
Patient tolerated BIPAP for 3 hours. Patient stated she had to take off because of her GI issues. RT will continue to monitor

## 2018-09-10 LAB — CBC WITH DIFFERENTIAL/PLATELET
Abs Immature Granulocytes: 0.09 10*3/uL — ABNORMAL HIGH (ref 0.00–0.07)
Basophils Absolute: 0 10*3/uL (ref 0.0–0.1)
Basophils Relative: 0 %
Eosinophils Absolute: 0 10*3/uL (ref 0.0–0.5)
Eosinophils Relative: 0 %
HCT: 28.7 % — ABNORMAL LOW (ref 36.0–46.0)
Hemoglobin: 8.2 g/dL — ABNORMAL LOW (ref 12.0–15.0)
Immature Granulocytes: 1 %
Lymphocytes Relative: 5 %
Lymphs Abs: 0.5 10*3/uL — ABNORMAL LOW (ref 0.7–4.0)
MCH: 24.3 pg — ABNORMAL LOW (ref 26.0–34.0)
MCHC: 28.6 g/dL — ABNORMAL LOW (ref 30.0–36.0)
MCV: 85.2 fL (ref 80.0–100.0)
Monocytes Absolute: 0.7 10*3/uL (ref 0.1–1.0)
Monocytes Relative: 7 %
Neutro Abs: 8.7 10*3/uL — ABNORMAL HIGH (ref 1.7–7.7)
Neutrophils Relative %: 87 %
Platelets: 81 10*3/uL — ABNORMAL LOW (ref 150–400)
RBC: 3.37 MIL/uL — ABNORMAL LOW (ref 3.87–5.11)
RDW: 18.3 % — ABNORMAL HIGH (ref 11.5–15.5)
WBC: 10 10*3/uL (ref 4.0–10.5)
nRBC: 0 % (ref 0.0–0.2)

## 2018-09-10 LAB — BASIC METABOLIC PANEL
Anion gap: 11 (ref 5–15)
BUN: 89 mg/dL — ABNORMAL HIGH (ref 8–23)
CO2: 50 mmol/L — ABNORMAL HIGH (ref 22–32)
Calcium: 8.3 mg/dL — ABNORMAL LOW (ref 8.9–10.3)
Chloride: 76 mmol/L — ABNORMAL LOW (ref 98–111)
Creatinine, Ser: 1.13 mg/dL — ABNORMAL HIGH (ref 0.44–1.00)
GFR calc Af Amer: 59 mL/min — ABNORMAL LOW (ref >60)
GFR calc non Af Amer: 51 mL/min — ABNORMAL LOW (ref >60)
Glucose, Bld: 109 mg/dL — ABNORMAL HIGH (ref 70–99)
Potassium: 2.6 mmol/L — CL (ref 3.5–5.1)
Sodium: 137 mmol/L (ref 135–145)

## 2018-09-10 LAB — GLUCOSE, CAPILLARY
Glucose-Capillary: 72 mg/dL (ref 70–99)
Glucose-Capillary: 88 mg/dL (ref 70–99)
Glucose-Capillary: 77 mg/dL (ref 70–99)
Glucose-Capillary: 127 mg/dL — ABNORMAL HIGH (ref 70–99)
Glucose-Capillary: 145 mg/dL — ABNORMAL HIGH (ref 70–99)
Glucose-Capillary: 156 mg/dL — ABNORMAL HIGH (ref 70–99)
Glucose-Capillary: 91 mg/dL (ref 70–99)

## 2018-09-10 MED ORDER — MIRTAZAPINE 15 MG PO TABS: 15.0000 mg | ORAL_TABLET | Freq: Every day | ORAL | Status: DC

## 2018-09-10 MED ORDER — POTASSIUM CHLORIDE CRYS ER 20 MEQ PO TBCR
40.0000 meq | EXTENDED_RELEASE_TABLET | Freq: Once | ORAL | Status: AC
  Administered 2018-09-10: 40 meq via ORAL
  Filled 2018-09-10: qty 2

## 2018-09-10 MED ORDER — PANTOPRAZOLE SODIUM 40 MG PO TBEC
40.0000 mg | DELAYED_RELEASE_TABLET | Freq: Every day | ORAL | Status: DC
  Administered 2018-09-12 – 2018-09-16 (×5): 40 mg via ORAL
  Filled 2018-09-10 (×5): qty 1

## 2018-09-10 MED ORDER — MIRTAZAPINE 15 MG PO TABS
15.0000 mg | ORAL_TABLET | Freq: Every evening | ORAL | Status: DC | PRN
  Administered 2018-09-10 – 2018-09-15 (×6): 15 mg via ORAL
  Filled 2018-09-10 (×6): qty 1

## 2018-09-10 MED ORDER — IPRATROPIUM-ALBUTEROL 0.5-2.5 (3) MG/3ML IN SOLN
3.0000 mL | Freq: Three times a day (TID) | RESPIRATORY_TRACT | Status: DC
  Administered 2018-09-11 – 2018-09-15 (×12): 3 mL via RESPIRATORY_TRACT
  Filled 2018-09-10 (×14): qty 3

## 2018-09-10 MED ORDER — POTASSIUM CHLORIDE 10 MEQ/100ML IV SOLN
10.0000 meq | INTRAVENOUS | Status: AC
  Administered 2018-09-10 (×5): 10 meq via INTRAVENOUS
  Filled 2018-09-10 (×6): qty 100

## 2018-09-10 MED ORDER — OXYCODONE-ACETAMINOPHEN 7.5-325 MG PO TABS
1.0000 | ORAL_TABLET | Freq: Four times a day (QID) | ORAL | Status: DC | PRN
  Administered 2018-09-10 – 2018-09-12 (×3): 1 via ORAL
  Filled 2018-09-10 (×3): qty 1

## 2018-09-10 NOTE — Progress Notes (Signed)
Pt off QHS BIPAP when RT went in to give scheduled neb tx.  RT offered to place pt back on BIPAP but pt refusing at this time stating that she was going to stay awake.  Pt to notify RT when ready to go back on.  RT to monitor and assess as needed.

## 2018-09-10 NOTE — Progress Notes (Signed)
CRITICAL VALUE ALERT  Critical Value:  K+ 2.6  Date & Time Notied: 1103  Provider Notified: Adhikari  Orders Received/Actions taken: Awaiting orders. Will continue to monitor

## 2018-09-10 NOTE — Progress Notes (Signed)
Daily Progress Note   Patient Name: Renee Noble       Date: 09/10/2018 DOB: Feb 03, 1956  Age: 63 y.o. MRN#: [id number] Attending Physician: Shelly Coss, MD Primary Care Physician: Delorse Limber Admit Date: 08/28/2018  Reason for Consultation/Follow-up: Establishing goals of care and Terminal Care  Subjective: Spoke with patient's health care surrogate Renee Noble).  She expresses frustration that Renee Noble will say one thing to please everyone but then she will do what she wants to do.  Renee Noble is also struggling as her husband was just discharged from [facility] after an extended stay and is having difficulty recovering.  I attempted to speak with Renee Noble - she wakes to touch but is lethargic.  She gives 1 word appropriate answers and then closes her eyes.  I added Renee Noble on speaker phone.  I talked for a bit about how fragile Renee Noble is due to DHF and advanced COPD.  We talked about the need to wear her CPAP.  Renee Noble states she is committed to wearing her CPAP.  Then we attempted to ask Renee Noble about code status.  She states she would want to be intubated / resuscitated.     We talked about being afraid to die.  Renee Noble states she is not afraid to die.  Renee Noble explained that [place] husband "came back" multiple times and explained to Renee Noble what it was like on the other side.  That is why Renee Noble is not afraid.  Renee Noble believes that Renee Noble has regrets about her son who wants to make ammends but Renee Noble feels she has nothing to apologize for - and has not agreed to forgiveness.  After our conversation I left the room with Renee Noble still on the phone.  Renee Noble was very frustrated.  She is concerned that she will be left with the responsibility of making difficult decisions for [name].  We  discussed [facility] for end of life care.Renee Noble is aware that Renee Noble is [facility] eligible and that they would take excellent care of her.  Renee Noble expressed understanding and we committed to talk again in the next couple days.   Assessment:  Attempted GOC discussion,  - patient lethargic but expresses that she would want to be full code/intubated.  Patient Profile/HPI:  63 y.o. female  with past medical history of renal cell carcinoma (s/p  nephrectomy) COPD on 3L at baseline, OSA noncompliant with CPAP, DHF, DM2, chronic pain opioid dependent, who was admitted on 08/28/2018 with what was felt to be a combination of volume overload and COPD exacerbation.  She was found to have new atrial fibrillation with RVR and acute on chronic renal failure.  She was transferred from [facility] to [facility] for [place] rule out.  On 4/15 she was intubated and extubated on 4/17.  She continued to improve until her volume status increase once again and she refused to wear CPAP.  On 4/22 she developed confusion secondary to hypercapneia and required re-intubation. On 4/23 she is unable to wean and sedated on low dose precedex.    Length of Stay: 13  Current Medications: Scheduled Meds:  . apixaban  5 mg Oral BID  . arformoterol  15 mcg Nebulization BID  . budesonide (PULMICORT) nebulizer solution  0.5 mg Nebulization BID  . Chlorhexidine Gluconate Cloth  6 each Topical Daily  . diltiazem  60 mg Oral Q6H  . feeding supplement (PRO-STAT SUGAR FREE 64)  30 mL Oral Daily  . furosemide  40 mg Intravenous BID  . insulin aspart  0-15 Units Subcutaneous Q4H  . insulin aspart  5 Units Subcutaneous Q4H  . insulin detemir  15 Units Subcutaneous BID  . ipratropium-albuterol  3 mL Nebulization Q6H  . levothyroxine  88 mcg Oral Q0600  . mouth rinse  15 mL Mouth Rinse BID  . nutrition supplement (JUVEN)  1 packet Oral BID BM  . nystatin   Topical TID  . [START ON 09/11/2018] pantoprazole  40 mg Oral  Daily  . potassium chloride  40 mEq Oral Once  . predniSONE  20 mg Oral Q breakfast  . sodium chloride flush  10-40 mL Intracatheter Q12H    Continuous Infusions: . potassium chloride      PRN Meds: acetaminophen **OR** acetaminophen, albuterol, bisacodyl, docusate sodium, labetalol, mirtazapine, ondansetron (ZOFRAN) IV, oxyCODONE-acetaminophen, sodium chloride flush  Physical Exam       Obese female, lethargic, but gives appropriate 1 word answers to questions before closing eyes again. CV rrr with murmur resp no distress, no frank w/c/r anteriorly Abdomen soft, nt,nd  Vital Signs: BP (!) 127/109   Pulse 86   Temp 97.9 F (36.6 C) (Oral)   Resp 18   Ht 5\' 4"  (1.626 m)   Wt 125.4 kg   SpO2 96%   BMI 47.45 kg/m  SpO2: SpO2: 96 % O2 Device: O2 Device: Nasal Cannula O2 Flow Rate: O2 Flow Rate (L/min): 3.5 L/min  Intake/output summary:   Intake/Output Summary (Last 24 hours) at 09/10/2018 1140 Last data filed at 09/10/2018 7425 Gross per 24 hour  Intake 30 ml  Output 5375 ml  Net -5345 ml   LBM: Last BM Date: 08/28/18 Baseline Weight: Weight: 124.3 kg Most recent weight: Weight: 125.4 kg       Palliative Assessment/Data: 30%    Flowsheet Rows     Most Recent Value  Intake Tab  Unit at Time of Referral  ICU  Palliative Care Primary Diagnosis  Pulmonary  Palliative Care Type  New Palliative care  Reason for referral  Clarify Goals of Care  Date of Admission  08/28/18  Clinical Assessment  Psychosocial & Spiritual Assessment  Palliative Care Outcomes      Patient Active Problem List   Diagnosis Date Noted  . Palliative care encounter   . Endotracheal tube present   . Pressure ulcer 08/30/2018  .  Acute exacerbation of chronic obstructive pulmonary disease (COPD) ([place]) 08/28/2018  . Atrial flutter with rapid ventricular response ([place]) 08/28/2018  . AKI (acute kidney injury) ([place]) 08/28/2018  . Wound of thigh 08/15/2018  . Pressure injury of skin  07/17/2018  . Hypoglycemia due to insulin 07/14/2018  . Acute on chronic respiratory failure with hypercapnia ([place]) 06/09/2018  . HLD (hyperlipidemia) 06/09/2018  . Hypothyroidism 06/09/2018  . Depression 06/09/2018  . Fibromyalgia   . Lung nodule 05/08/2018  . Acute on chronic congestive heart failure ([place])   . Morbid obesity with BMI of 50.0-59.9, adult ([place]) 04/10/2018  . Chronic pain 04/07/2018  . Acute on chronic diastolic CHF (congestive heart failure) ([place]) 04/07/2018  . PNA (pneumonia) 03/21/2018  . COPD, group D, by GOLD 2017 classification ([place]) 02/25/2018  . Chronic pain syndrome   . Normocytic anemia 02/24/2018  . OSA (obstructive sleep apnea) 08/16/2017  . Morbid obesity due to excess calories ([place]) 04/29/2017  . Cor pulmonale (chronic) ([place]) 04/29/2017  . Chronic respiratory failure with hypoxia and hypercapnia ([place]) 04/28/2017  . CKD (chronic kidney disease), stage III ([place]) 04/14/2016  . Hyperlipidemia 01/16/2016  . Essential hypertension 01/16/2016  . Hypoxia   . Thrombocytopenia ([place]) 07/12/2015  . Thyroid activity decreased 06/23/2015  . Encounter for orogastric (OG) tube placement 09/02/2014  . Hernia of abdominal cavity 07/30/2014  . Abnormal CXR 02/15/2014  . COPD GOLDIII with min reversibility  12/16/2013  . History of hepatitis C 12/16/2013  . Breast cancer screening 12/16/2013  . Type II diabetes mellitus with renal manifestations ([place]) 12/16/2013  . Screening for osteoporosis 12/16/2013    Palliative Care Plan    Recommendations/Plan:  Full code, full scope.  Health care surrogate decision maker is Venancio Poisson  PMT will follow intermittently with you.  Goals of Care and Additional Recommendations:  Limitations on Scope of Treatment: Full Scope Treatment  Code Status:  Full code  Prognosis:   Unable to determine.  If patient is not intubated or wearing CPAP she is hospice house eligible secondary to rapid onset of hypercapnia and  Advanced DHF.   Discharge Planning:  To Be Determined  Care plan was discussed with Venancio Poisson.  Thank you for allowing the Palliative Medicine Team to assist in the care of this patient.  Total time spent:  35 min.     Greater than 50%  of this time was spent counseling and coordinating care related to the above assessment and plan.  Florentina Jenny, PA-C Palliative Medicine  Please contact Palliative MedicineTeam phone at [phone] for questions and concerns between 7 am - 7 pm.   Please see AMION for individual provider pager numbers.

## 2018-09-10 NOTE — Progress Notes (Addendum)
PROGRESS NOTE    Renee Noble  TKW:[id number] DOB: June 30, 1955 DOA: 08/28/2018 PCP: [name], PA-C   Brief Narrative: Patient is a 63 year old female with history of chronic hypoxic respiratory failure on 3 L oxygen at home, severe COPD, chronic diastolic CHF, diabetes type 2, obesity who presents to [facility] with acute on chronic diastolic heart failure, COPD exacerbation.  Patient had 3 to 4 days of progressive shortness of breath and orthopnea.  Patient was transferred to [facility] for COVID rule out.  COVID-19 came out to be negative twice.  On 08/30/2015 hypercapnic with increased work of breathing necessitating intubation and mechanical ventilation.  Extubated to nasal cannula on 09/01/2018.  She continued to remain noncompliant to fluid restriction and CPAP.  She was intubated again on 09/06/2018 and was moved to ICU.  Extubated on 09/09/18.Physical therapy evaluated her and recommended skilled nursing facility on discharge.SW following.  Assessment & Plan:   Principal Problem:   Acute on chronic respiratory failure with hypercapnia (HCC) Active Problems:   Type II diabetes mellitus with renal manifestations ([place])   Encounter for orogastric (OG) tube placement   Chronic pain syndrome   COPD, group D, by GOLD 2017 classification ([place])   Acute on chronic diastolic CHF (congestive heart failure) (HCC)   Acute on chronic congestive heart failure (HCC)   Hypothyroidism   Acute exacerbation of chronic obstructive pulmonary disease (COPD) ([place])   Atrial flutter with rapid ventricular response (HCC)   AKI (acute kidney injury) ([place])   Pressure ulcer   Endotracheal tube present   Palliative care encounter  Acute on chronic respiratory failure with hypoxia and hypercapnia: Secondary to diastolic CHF, COPD, obesity hypoventilation syndrome, sleep apnea. She has been intubated twice during this hospitalization.  She is on 3 L oxygen via nasal cannula at home.  Continue  bronchodilators as needed.  Suspicion for COVID-19: Ruled out.  Negative tests x2.  Acute on chronic diastolic heart failure: TTE on 11/19 showed ejection fraction of 65%.  Presented with 30 pounds of weight gain.  She is very noncompliant with fluid intake.  Constantly asked for increasing the limit of fluid intake.  She was started on Lasix drip here.  Currently on lasix 40 mg daily BID.  She was on torsemide 20 mg daily at home.Bilateral lower extremity edema have improved. We will continue to monitor input/output, daily weight.  Fluid restriction to less than 1.5 L a day. She should follow-up with cardiology as an outpatient.  COPD exacerbation: Gold 4.FEV1 30%.  Continue current bronchodilators.  She is on steroid at home.Continue prednisone.  Obesity hypoventilation syndrome/sleep apnea:Very noncompliant nocturnal CPAP.  I have again stressed the importance of using CPAP at night.  Paroxysmal A. fib: Currently rate is controlled.  Anticoagulated with Eliquis.  On Cardizem for rate control  AKI on CKD stage III: Baseline creatinine of 1.2.  Creatinine on baseline now.  Continue to monitor.  She has H/O  left nephrectomy.  Anemia/Thrombocytopenia: Most likely associated with chronic medical problems.  Currently stable.  Continue to monitor  Diabetes mellitus type 2: Continue Lantus and sliding scale insulin  Hypothyroidism: Continue Synthyroid  Pressure injury Left/right ischial tuberosities, present on admission  Debility/deconditioning: Patient evaluated by physical therapy and recommended skilled nursing facility on discharge.SW following.  Right upper lobe pulmonary nodule: Recommended CT chest in June/July 2020.  Multiple intubations/noncompliance/multiple comorbidities: Pallliative care was following.  Reconsulted again for discussing on goals of care and CODE STATUS.  I think  she is appropriate for DNR/DNI.        Nutrition Problem: Inadequate oral intake Etiology:  inability to eat      DVT prophylaxis: Eliquis Code Status: Full Family Communication: None present at the bedside Disposition Plan: Skilled nursing facility after her respiratory status remains stable for 1-2 more days   Consultants: PCCM  Procedures: Intubation  Antimicrobials:  Anti-infectives (From admission, onward)   Start     Dose/Rate Route Frequency Ordered Stop   09/02/18 1100  amoxicillin-clavulanate (AUGMENTIN) 875-125 MG per tablet 1 tablet  Status:  Discontinued     1 tablet Oral Every 12 hours 09/02/18 1006 09/06/18 1401   08/30/18 2000  cefTRIAXone (ROCEPHIN) 1 g in sodium chloride 0.9 % 100 mL IVPB  Status:  Discontinued     1 g 200 mL/hr over 30 Minutes Intravenous Every 24 hours 08/30/18 1947 09/02/18 0919   08/30/18 2000  azithromycin (ZITHROMAX) 500 mg in sodium chloride 0.9 % 250 mL IVPB  Status:  Discontinued     500 mg 250 mL/hr over 60 Minutes Intravenous Every 24 hours 08/30/18 1947 09/02/18 0919      Subjective: Patient seen and examined at bedside this morning.  Hemodynamically stable at present.  Denies any shortness of breath or chest pain.  Again counseled on the importance of being compliant on CPAP at night.  Patient feels that she does not want to be intubated again if needed.  Objective: Vitals:   09/10/18 0515 09/10/18 0600 09/10/18 0800 09/10/18 0900  BP: (!) 162/58 133/88  (!) 127/109  Pulse:  92  86  Resp:  18  18  Temp:   97.9 F (36.6 C)   TempSrc:   Oral   SpO2:  96%  96%  Weight:      Height:        Intake/Output Summary (Last 24 hours) at 09/10/2018 0955 Last data filed at 09/10/2018 4081 Gross per 24 hour  Intake 40 ml  Output 7350 ml  Net -7310 ml   Filed Weights   09/08/18 0401 09/09/18 0500 09/10/18 0500  Weight: 135.8 kg 129.4 kg 125.4 kg    Examination:  General exam: Not in distress, morbidly obese  HEENT:PERRL,Oral mucosa moist, Ear/Nose normal on gross exam Respiratory system: Bilateral decreased air  entry on the bases Cardiovascular system: S1 & S2 heard, RRR. No JVD, murmurs, rubs, gallops or clicks.  Central line on the left side of the chest Gastrointestinal system: Abdomen is nondistended, soft and nontender. No organomegaly or masses felt. Normal bowel sounds heard.  Hernia on the left subcostal region Central nervous system: Alert and oriented. No focal neurological deficits. Extremities: 1-2 + lower extremity edema , no clubbing ,no cyanosis, distal peripheral pulses palpable. Skin: No rashes, lesions or ulcers,no icterus ,no pallor Chronic venous stasis changes on the bilateral lower extremities   Data Reviewed: I have personally reviewed following labs and imaging studies  CBC: Recent Labs  Lab 09/04/18 0334 09/05/18 1042 09/06/18 2330 09/08/18 0420 09/09/18 0301  WBC 5.4 9.3 6.5 5.1 11.1*  NEUTROABS  --   --  6.1  --   --   HGB 8.9* 8.6* 8.0* 7.5* 8.7*  HCT 31.5* 30.6* 28.6* 25.2* 29.8*  MCV 85.8 86.0 85.6 81.6 82.8  PLT 63* 67* 60* 73* 87*   Basic Metabolic Panel: Recent Labs  Lab 09/06/18 0602 09/06/18 2330 09/07/18 0928 09/07/18 1800 09/08/18 0420 09/08/18 1700 09/09/18 0301  NA 135 134* 135  --  138  --  138  K 3.9 4.3 3.6  --  2.8*  --  3.0*  CL 85* 85* 86*  --  85*  --  81*  CO2 38* 37* 38*  --  42*  --  46*  GLUCOSE 176* 132* 125*  --  246*  --  106*  BUN 110* 98* 121*  --  126*  --  113*  CREATININE 1.77* 1.70* 1.73*  --  1.63*  --  1.39*  CALCIUM 7.9* 7.9* 7.8*  --  7.7*  --  8.3*  MG  --  1.8 1.7 1.8 1.9 1.7  --   PHOS  --  5.8* 4.2 3.6 3.3 3.0  --    GFR: Estimated Creatinine Clearance: 52.9 mL/min (A) (by C-G formula based on SCr of 1.39 mg/dL (H)). Liver Function Tests: Recent Labs  Lab 09/08/18 0420 09/09/18 0301  AST 14* 21  ALT 14 17  ALKPHOS 37* 55  BILITOT 0.8 0.8  PROT 5.0* 5.8*  ALBUMIN 3.0* 3.4*   No results for input(s): LIPASE, AMYLASE in the last 168 hours. No results for input(s): AMMONIA in the last 168 hours.  Coagulation Profile: No results for input(s): INR, PROTIME in the last 168 hours. Cardiac Enzymes: Recent Labs  Lab 09/03/18 2230  TROPONINI 0.04*   BNP (last 3 results) No results for input(s): PROBNP in the last 8760 hours. HbA1C: No results for input(s): HGBA1C in the last 72 hours. CBG: Recent Labs  Lab 09/09/18 1539 09/09/18 1933 09/09/18 2332 09/10/18 0340 09/10/18 0454  GLUCAP 142* 162* 132* 72 88   Lipid Profile: No results for input(s): CHOL, HDL, LDLCALC, TRIG, CHOLHDL, LDLDIRECT in the last 72 hours. Thyroid Function Tests: No results for input(s): TSH, T4TOTAL, FREET4, T3FREE, THYROIDAB in the last 72 hours. Anemia Panel: No results for input(s): VITAMINB12, FOLATE, FERRITIN, TIBC, IRON, RETICCTPCT in the last 72 hours. Sepsis Labs: No results for input(s): PROCALCITON, LATICACIDVEN in the last 168 hours.  No results found for this or any previous visit (from the past 240 hour(s)).       Radiology Studies: Dg Chest Port 1 View  Result Date: 09/09/2018 CLINICAL DATA:  Pulmonary edema. EXAM: PORTABLE CHEST 1 VIEW COMPARISON:  September 08, 2018 FINDINGS: The left central line is been pulled back slightly in the interval, terminating just proximal to the brachiocephalic confluence. No pneumothorax. Bilateral pleural effusions with underlying opacity. Decreased pulmonary edema. IMPRESSION: 1. Left central line is been pulled back slightly in the interval, likely terminating just proximal to the brachiocephalic confluence. Removal of ET and NG tubes. 2. Bilateral pleural effusions with underlying opacities, likely atelectasis. Electronically Signed   By: Dorise Bullion III M.D   On: 09/09/2018 05:28        Scheduled Meds: . apixaban  5 mg Oral BID  . arformoterol  15 mcg Nebulization BID  . budesonide (PULMICORT) nebulizer solution  0.5 mg Nebulization BID  . Chlorhexidine Gluconate Cloth  6 each Topical Daily  . diltiazem  60 mg Oral Q6H  . feeding  supplement (PRO-STAT SUGAR FREE 64)  30 mL Oral Daily  . furosemide  40 mg Intravenous BID  . insulin aspart  0-15 Units Subcutaneous Q4H  . insulin aspart  5 Units Subcutaneous Q4H  . insulin detemir  15 Units Subcutaneous BID  . ipratropium-albuterol  3 mL Nebulization Q6H  . levothyroxine  88 mcg Oral Q0600  . mouth rinse  15 mL Mouth Rinse BID  . nutrition supplement (JUVEN)  1 packet  Oral BID BM  . nystatin   Topical TID  . pantoprazole (PROTONIX) IV  40 mg Intravenous QHS  . predniSONE  20 mg Oral Q breakfast  . sodium chloride flush  10-40 mL Intracatheter Q12H   Continuous Infusions:   LOS: 13 days    Time spent: 35 mins.More than 50% of that time was spent in counseling and/or coordination of care.      Shelly Coss, MD [facility] Pager [phone]  If 7PM-7AM, please contact night-coverage [email or web address] Password [facility] 09/10/2018, 9:55 AM

## 2018-09-10 NOTE — TOC Progression Note (Signed)
Transition of Care [facility]) - Progression Note    Patient Details  Name: Renee Noble MRN: [id number] Date of Birth: Nov 15, 1955  Transition of Care [facility]) CM/SW Contact  Servando Snare, [place] Phone Number: 09/10/2018, 8:53 AM  Clinical Narrative:   Patient was faxed out to [facility] for SNF. [facility] denied. LCSW faxed patient out to additional facilities. Awaiting bed offers.          Expected Discharge Plan and Services                                                 Social Determinants of Health (SDOH) Interventions    Readmission Risk Interventions No flowsheet data found.

## 2018-09-11 ENCOUNTER — Inpatient Hospital Stay (HOSPITAL_COMMUNITY): Payer: Medicare Other

## 2018-09-11 ENCOUNTER — Inpatient Hospital Stay: Payer: Self-pay

## 2018-09-11 LAB — GLUCOSE, CAPILLARY
Glucose-Capillary: 86 mg/dL (ref 70–99)
Glucose-Capillary: 91 mg/dL (ref 70–99)
Glucose-Capillary: 80 mg/dL (ref 70–99)
Glucose-Capillary: 66 mg/dL — ABNORMAL LOW (ref 70–99)
Glucose-Capillary: 94 mg/dL (ref 70–99)
Glucose-Capillary: 87 mg/dL (ref 70–99)
Glucose-Capillary: 94 mg/dL (ref 70–99)

## 2018-09-11 LAB — BLOOD GAS, ARTERIAL
Acid-Base Excess: 28.4 mmol/L — ABNORMAL HIGH (ref 0.0–2.0)
Bicarbonate: 56.7 mmol/L — ABNORMAL HIGH (ref 20.0–28.0)
Drawn by: 295031
O2 Content: 4 L/min
O2 Saturation: 79.1 %
Patient temperature: 98.6
pCO2 arterial: 91.2 mmHg (ref 32.0–48.0)
pH, Arterial: 7.41 (ref 7.350–7.450)
pO2, Arterial: 45.9 mmHg — ABNORMAL LOW (ref 83.0–108.0)

## 2018-09-11 LAB — BASIC METABOLIC PANEL
BUN: 71 mg/dL — ABNORMAL HIGH (ref 8–23)
CO2: >50 mmol/L — ABNORMAL HIGH (ref 22–32)
Calcium: 8.6 mg/dL — ABNORMAL LOW (ref 8.9–10.3)
Chloride: 77 mmol/L — ABNORMAL LOW (ref 98–111)
Creatinine, Ser: 0.99 mg/dL (ref 0.44–1.00)
GFR calc Af Amer: >60 mL/min (ref >60)
GFR calc non Af Amer: 60 mL/min — ABNORMAL LOW (ref >60)
Glucose, Bld: 79 mg/dL (ref 70–99)
Potassium: 2.9 mmol/L — ABNORMAL LOW (ref 3.5–5.1)
Sodium: 139 mmol/L (ref 135–145)

## 2018-09-11 LAB — MAGNESIUM: Magnesium: 1.8 mg/dL (ref 1.7–2.4)

## 2018-09-11 MED ORDER — LORAZEPAM 2 MG/ML IJ SOLN
1.0000 mg | Freq: Once | INTRAMUSCULAR | Status: AC
  Administered 2018-09-11: 1 mg via INTRAVENOUS
  Filled 2018-09-11: qty 1

## 2018-09-11 MED ORDER — POTASSIUM CHLORIDE 10 MEQ/100ML IV SOLN
10.0000 meq | INTRAVENOUS | Status: AC
  Administered 2018-09-11 (×4): 10 meq via INTRAVENOUS
  Filled 2018-09-11 (×4): qty 100

## 2018-09-11 MED ORDER — DILTIAZEM HCL ER COATED BEADS 180 MG PO CP24
180.0000 mg | ORAL_CAPSULE | Freq: Every day | ORAL | Status: DC
  Administered 2018-09-12 – 2018-09-16 (×5): 180 mg via ORAL
  Filled 2018-09-11 (×5): qty 1

## 2018-09-11 MED ORDER — DEXTROSE 50 % IV SOLN
INTRAVENOUS | Status: AC
  Administered 2018-09-11: 25 mL via INTRAVENOUS
  Filled 2018-09-11: qty 50

## 2018-09-11 MED ORDER — MAGNESIUM SULFATE 2 GM/50ML IV SOLN
2.0000 g | Freq: Once | INTRAVENOUS | Status: AC
  Administered 2018-09-11: 2 g via INTRAVENOUS
  Filled 2018-09-11: qty 50

## 2018-09-11 MED ORDER — BISACODYL 10 MG RE SUPP
10.0000 mg | Freq: Once | RECTAL | Status: AC
  Administered 2018-09-11: 10 mg via RECTAL
  Filled 2018-09-11: qty 1

## 2018-09-11 MED ORDER — DEXTROSE 50 % IV SOLN
25.0000 mL | Freq: Once | INTRAVENOUS | Status: AC
  Administered 2018-09-11: 25 mL via INTRAVENOUS

## 2018-09-11 MED ORDER — POTASSIUM CHLORIDE CRYS ER 20 MEQ PO TBCR
40.0000 meq | EXTENDED_RELEASE_TABLET | Freq: Every day | ORAL | Status: DC
  Administered 2018-09-12: 10:00:00 40 meq via ORAL
  Filled 2018-09-11 (×2): qty 2

## 2018-09-11 MED ORDER — POLYETHYLENE GLYCOL 3350 17 G PO PACK
17.0000 g | PACK | Freq: Every day | ORAL | Status: DC
  Administered 2018-09-12 – 2018-09-14 (×2): 17 g via ORAL
  Filled 2018-09-11 (×5): qty 1

## 2018-09-11 MED ORDER — POTASSIUM CHLORIDE CRYS ER 20 MEQ PO TBCR
40.0000 meq | EXTENDED_RELEASE_TABLET | Freq: Once | ORAL | Status: DC
  Filled 2018-09-11: qty 2

## 2018-09-11 NOTE — Progress Notes (Signed)
PT Cancellation Note  Patient Details Name: Renee Noble MRN: [id number] DOB: September 10, 1955   Cancelled Treatment:    Reason Eval/Treat Not Completed: Medical issues which prohibited therapy Hypercapnia this morning and on continuous BiPAP.  Will check back as schedule permits.   Lateisha Thurlow,KATHrine E 09/11/2018, 11:59 AM Carmelia Bake, PT, DPT Acute Rehabilitation Services Office: [phone] Pager: [phone]

## 2018-09-11 NOTE — Progress Notes (Signed)
Pt continues to remove BiPap and continues to take in liquids. When Bipap was removed, pt O2 sats dropped to 69. Nasal cannula applied. All food/liquid removed from the room. The pt was educated on the risk for aspiration while eating/drinking on Bipap. Pt was also educated on the importance of using the Bipap in order to correct her CO2 level. Pt verbalized understanding. Will reapply Bipap when risk for aspiration has decreased

## 2018-09-11 NOTE — Progress Notes (Signed)
Pt being followed by [facility] team for disposition assistance. Has been referred to SNFs and would need authorization for admission once facility selected. Pt also being followed by PMT, possibly hospice eligible. Goals of care will impact disposition. CSW will continue to follow and assist.  Sharren Bridge, MSW, LCSW Clinical Social Work 09/11/2018 [phone]

## 2018-09-11 NOTE — Progress Notes (Signed)
SLP Cancellation Note  Patient Details Name: Renee Noble MRN: [id number] DOB: 1956/02/11   Cancelled treatment:       Reason Eval/Treat Not Completed: Other (comment)(Hypercapnia this morning and on continuous BiPAP) Will continue efforts.  Macario Golds 09/11/2018, 12:03 PM  Luanna Salk, [facility] SLP [place] Pager [phone] Office [phone]

## 2018-09-11 NOTE — Progress Notes (Signed)
Spoke with [facility] NP. K. Schorr;   Regarding the chest xray to determine if the PICC is in the correct place.  The  Report states that  "the tip of the PICC terminates just to left of the midline" and the recommendation of correlation with blood gas or pressure transduction, K. Schorr NP recommends that the PICC line not be used tonight and to clarify  placement in the morning.  Notified IVT,

## 2018-09-11 NOTE — Progress Notes (Signed)
OT Cancellation Note  Patient Details Name: Renee Noble MRN: [id number] DOB: 14-Nov-1955     Cancelled Treatment:    Reason Eval/Treat Not Completed: Medical issues which prohibited therapy   Medical issues which prohibited therapy Hypercapnia this morning and on continuous BiPAP.    Kari Baars, OT Acute Rehabilitation Services [phone] Office- [phone], Thereasa Parkin 09/11/2018, 12:53 PM

## 2018-09-11 NOTE — Progress Notes (Signed)
Per RN pt does not want to wear cpap due to nausea.  We have explained that due to her high CO2 it is very important for her to use cpap. RT will continue to encourage her to be compliant with MD's orders.

## 2018-09-11 NOTE — Progress Notes (Signed)
Pt. placed on BiPAP, tolerating well, RN made aware prior to placement.

## 2018-09-11 NOTE — Progress Notes (Signed)
Peripherally Inserted Central Catheter/Midline Placement  The IV Nurse has discussed with the patient and/or persons authorized to consent for the patient, the purpose of this procedure and the potential benefits and risks involved with this procedure.  The benefits include less needle sticks, lab draws from the catheter, and the patient may be discharged home with the catheter. Risks include, but not limited to, infection, bleeding, blood clot (thrombus formation), and puncture of an artery; nerve damage and irregular heartbeat and possibility to perform a PICC exchange if needed/ordered by physician.  Alternatives to this procedure were also discussed.  Bard Power PICC patient education guide, fact sheet on infection prevention and patient information card has been provided to patient /or left at bedside.    PICC/Midline Placement Documentation  PICC Double Lumen 09/11/18 PICC Right Brachial 40 cm 0 cm (Active)  Indication for Insertion or Continuance of Line Prolonged intravenous therapies 09/11/2018  6:38 PM  Exposed Catheter (cm) 0 cm 09/11/2018  6:38 PM  Site Assessment Clean;Dry;Intact 09/11/2018  6:38 PM  Lumen #1 Status Flushed;Blood return noted 09/11/2018  6:38 PM  Lumen #2 Status Flushed;Blood return noted 09/11/2018  6:38 PM  Dressing Type Transparent 09/11/2018  6:38 PM  Dressing Status Clean;Dry;Intact;Antimicrobial disc in place 09/11/2018  6:38 PM  Dressing Intervention New dressing 09/11/2018  6:38 PM  Dressing Change Due 09/18/18 09/11/2018  6:38 PM       Renee Noble 09/11/2018, 6:39 PM

## 2018-09-11 NOTE — Progress Notes (Addendum)
PROGRESS NOTE    Renee Noble  ELF:[id number] DOB: 1955-06-15 DOA: 08/28/2018 PCP: [name], PA-C   Brief Narrative: Patient is a 63 year old female with history of chronic hypoxic respiratory failure on 3 L oxygen at home, severe COPD, chronic diastolic CHF, diabetes type 2, obesity who presents to [facility] with acute on chronic diastolic heart failure, COPD exacerbation.  Patient had 3 to 4 days of progressive shortness of breath and orthopnea.  Patient was transferred to [facility] for COVID rule out.  COVID-19 came out to be negative twice.  On 08/30/2015 hypercapnic with increased work of breathing necessitating intubation and mechanical ventilation.  Extubated to nasal cannula on 09/01/2018.  She continued to remain noncompliant to fluid restriction and CPAP.  She was intubated again on 09/06/2018 and was moved to ICU.  Extubated on 09/09/18.Physical therapy evaluated her and recommended skilled nursing facility on discharge.SW following.  Assessment & Plan:   Principal Problem:   Acute on chronic respiratory failure with hypercapnia (HCC) Active Problems:   Type II diabetes mellitus with renal manifestations ([place])   Encounter for orogastric (OG) tube placement   Chronic pain syndrome   COPD, group D, by GOLD 2017 classification ([place])   Acute on chronic diastolic CHF (congestive heart failure) (HCC)   Acute on chronic congestive heart failure (HCC)   Hypothyroidism   Acute exacerbation of chronic obstructive pulmonary disease (COPD) ([place])   Atrial flutter with rapid ventricular response (HCC)   AKI (acute kidney injury) ([place])   Pressure ulcer   Endotracheal tube present   Palliative care encounter  Acute on chronic respiratory failure with hypoxia and hypercapnia: Secondary to diastolic CHF, COPD, obesity hypoventilation syndrome, sleep apnea. She has been intubated twice during this hospitalization.  She is on 3 L oxygen via nasal cannula at home.  Continue  bronchodilators as needed. ABG done this morning showed hypercapnia with CO2 of 90.  Continue continuous BiPAP for now.  Mandatory BiPAP at night.  Suspicion for COVID-19: Ruled out.  Negative tests x2.  Acute on chronic diastolic heart failure: TTE on 11/19 showed ejection fraction of 65%.  Presented with 30 pounds of weight gain.  She is very noncompliant with fluid intake.  Constantly asked for increasing the limit of fluid intake.  She was started on Lasix drip here.  Currently on lasix 40 mg daily BID.  She was on torsemide 20 mg daily at home.Bilateral lower extremity edema have improved. We will continue to monitor input/output, daily weight.  Fluid restriction to less than 1.5 L a day. She should follow-up with cardiology as an outpatient.  COPD exacerbation: Gold 4.FEV1 30%.  Continue current bronchodilators.  She is on steroid at home.Continue prednisone.  Obesity hypoventilation syndrome/sleep apnea:Very noncompliant nocturnal CPAP/bipap.  I have again stressed the importance of using  BiPAP at night.Continue Bipap at night indefinitely.  Hypokalemia: Secondary to Lasix.  Continue supplementation  Paroxysmal A. fib: Currently rate is controlled.  Anticoagulated with Eliquis.  On Cardizem for rate control  AKI on CKD stage III: Baseline creatinine of 1.2.  Creatinine on baseline now.  Continue to monitor.  She has H/O  left nephrectomy.  Anemia/Thrombocytopenia: Most likely associated with chronic medical problems.  Currently stable.  Continue to monitor  Diabetes mellitus type 2: Continue Lantus and sliding scale insulin  Hypothyroidism: Continue Synthyroid  Pressure injury Left/right ischial tuberosities, present on admission  Debility/deconditioning: Patient evaluated by physical therapy and recommended skilled nursing facility on discharge.SW following.  Right upper lobe pulmonary nodule: Recommended CT chest in June/July 2020.  Multiple  intubations/noncompliance/multiple comorbidities: Pallliative care was following.  Reconsulted again for discussing on goals of care and CODE STATUS. Remains full code.  Constipation : Ordered Miralax and Dulcolax.   Nutrition Problem: Inadequate oral intake Etiology: inability to eat      DVT prophylaxis: Eliquis Code Status: Full Family Communication: None present at the bedside Disposition Plan: Skilled nursing facility after her respiratory status remains stable for 1-2 more days   Consultants: PCCM  Procedures: Intubation x 2  Antimicrobials:  Anti-infectives (From admission, onward)   Start     Dose/Rate Route Frequency Ordered Stop   09/02/18 1100  amoxicillin-clavulanate (AUGMENTIN) 875-125 MG per tablet 1 tablet  Status:  Discontinued     1 tablet Oral Every 12 hours 09/02/18 1006 09/06/18 1401   08/30/18 2000  cefTRIAXone (ROCEPHIN) 1 g in sodium chloride 0.9 % 100 mL IVPB  Status:  Discontinued     1 g 200 mL/hr over 30 Minutes Intravenous Every 24 hours 08/30/18 1947 09/02/18 0919   08/30/18 2000  azithromycin (ZITHROMAX) 500 mg in sodium chloride 0.9 % 250 mL IVPB  Status:  Discontinued     500 mg 250 mL/hr over 60 Minutes Intravenous Every 24 hours 08/30/18 1947 09/02/18 0919      Subjective: Patient seen and examined the bedside this morning.  Hemodynamically stable.  ABG showed PCO2 more than 90 today.  She did not want  continuous BiPAP at night.  She says she cannot sleep with BiPAP.  Again stressed the importance of using BiPAP at night.  Complains of constipation.  Objective: Vitals:   09/11/18 0700 09/11/18 0800 09/11/18 0813 09/11/18 1000  BP: (!) 133/48 (!) 149/64  (!) 121/56  Pulse: 72 77  82  Resp: 19 12  (!) 23  Temp:   97.8 F (36.6 C)   TempSrc:   Oral   SpO2: 100% 96% 96% 94%  Weight:      Height:        Intake/Output Summary (Last 24 hours) at 09/11/2018 1055 Last data filed at 09/11/2018 0500 Gross per 24 hour  Intake -  Output 4150  ml  Net -4150 ml   Filed Weights   09/09/18 0500 09/10/18 0500 09/11/18 0500  Weight: 129.4 kg 125.4 kg 124.4 kg    Examination:   General exam: Not in distress, morbidly obese  HEENT:PERRL,Oral mucosa moist, Ear/Nose normal on gross exam Respiratory system: Bilateral decreased air entry Cardiovascular system: S1 & S2 heard, RRR. No JVD, murmurs, rubs, gallops or clicks.  Central line on the left side of the chest Gastrointestinal system: Abdomen is nondistended, soft and nontender.  Normal bowel sounds heard.  Abdominal hernia.  Edema of the abdominal wall. Central nervous system: Alert and oriented. No focal neurological deficits. Extremities: 1-2 + lower extremity edema, no clubbing ,no cyanosis,    Data Reviewed: I have personally reviewed following labs and imaging studies  CBC: Recent Labs  Lab 09/05/18 1042 09/06/18 2330 09/08/18 0420 09/09/18 0301 09/10/18 0737  WBC 9.3 6.5 5.1 11.1* 10.0  NEUTROABS  --  6.1  --   --  8.7*  HGB 8.6* 8.0* 7.5* 8.7* 8.2*  HCT 30.6* 28.6* 25.2* 29.8* 28.7*  MCV 86.0 85.6 81.6 82.8 85.2  PLT 67* 60* 73* 87* 81*   Basic Metabolic Panel: Recent Labs  Lab 09/06/18 2330 09/07/18 0928 09/07/18 1800 09/08/18 0420 09/08/18 1700 09/09/18 0301 09/10/18 0737 09/11/18 0400  NA 134* 135  --  138  --  138 137 139  K 4.3 3.6  --  2.8*  --  3.0* 2.6* 2.9*  CL 85* 86*  --  85*  --  81* 76* 77*  CO2 37* 38*  --  42*  --  46* 50* >50*  GLUCOSE 132* 125*  --  246*  --  106* 109* 79  BUN 98* 121*  --  126*  --  113* 89* 71*  CREATININE 1.70* 1.73*  --  1.63*  --  1.39* 1.13* 0.99  CALCIUM 7.9* 7.8*  --  7.7*  --  8.3* 8.3* 8.6*  MG 1.8 1.7 1.8 1.9 1.7  --   --  1.8  PHOS 5.8* 4.2 3.6 3.3 3.0  --   --   --    GFR: Estimated Creatinine Clearance: 73.9 mL/min (by C-G formula based on SCr of 0.99 mg/dL). Liver Function Tests: Recent Labs  Lab 09/08/18 0420 09/09/18 0301  AST 14* 21  ALT 14 17  ALKPHOS 37* 55  BILITOT 0.8 0.8  PROT  5.0* 5.8*  ALBUMIN 3.0* 3.4*   No results for input(s): LIPASE, AMYLASE in the last 168 hours. No results for input(s): AMMONIA in the last 168 hours. Coagulation Profile: No results for input(s): INR, PROTIME in the last 168 hours. Cardiac Enzymes: No results for input(s): CKTOTAL, CKMB, CKMBINDEX, TROPONINI in the last 168 hours. BNP (last 3 results) No results for input(s): PROBNP in the last 8760 hours. HbA1C: No results for input(s): HGBA1C in the last 72 hours. CBG: Recent Labs  Lab 09/10/18 1543 09/10/18 1934 09/10/18 2258 09/11/18 0344 09/11/18 0817  GLUCAP 145* 156* 91 86 91   Lipid Profile: No results for input(s): CHOL, HDL, LDLCALC, TRIG, CHOLHDL, LDLDIRECT in the last 72 hours. Thyroid Function Tests: No results for input(s): TSH, T4TOTAL, FREET4, T3FREE, THYROIDAB in the last 72 hours. Anemia Panel: No results for input(s): VITAMINB12, FOLATE, FERRITIN, TIBC, IRON, RETICCTPCT in the last 72 hours. Sepsis Labs: No results for input(s): PROCALCITON, LATICACIDVEN in the last 168 hours.  No results found for this or any previous visit (from the past 240 hour(s)).       Radiology Studies: No results found.      Scheduled Meds: . apixaban  5 mg Oral BID  . arformoterol  15 mcg Nebulization BID  . budesonide (PULMICORT) nebulizer solution  0.5 mg Nebulization BID  . Chlorhexidine Gluconate Cloth  6 each Topical Daily  . diltiazem  180 mg Oral Daily  . feeding supplement (PRO-STAT SUGAR FREE 64)  30 mL Oral Daily  . furosemide  40 mg Intravenous BID  . insulin aspart  0-15 Units Subcutaneous Q4H  . insulin aspart  5 Units Subcutaneous Q4H  . insulin detemir  15 Units Subcutaneous BID  . ipratropium-albuterol  3 mL Nebulization TID  . levothyroxine  88 mcg Oral Q0600  . mouth rinse  15 mL Mouth Rinse BID  . nutrition supplement (JUVEN)  1 packet Oral BID BM  . nystatin   Topical TID  . pantoprazole  40 mg Oral Daily  . polyethylene glycol  17 g Oral  Daily  . potassium chloride  40 mEq Oral Once  . [START ON 09/12/2018] potassium chloride  40 mEq Oral Daily  . predniSONE  20 mg Oral Q breakfast  . sodium chloride flush  10-40 mL Intracatheter Q12H   Continuous Infusions: . magnesium sulfate 1 - 4 g bolus IVPB    .  potassium chloride 10 mEq (09/11/18 1005)     LOS: 14 days    Time spent: 35 mins.More than 50% of that time was spent in counseling and/or coordination of care.      Shelly Coss, MD [facility] Pager [phone]  If 7PM-7AM, please contact night-coverage [email or web address] Password [facility] 09/11/2018, 10:55 AM

## 2018-09-12 LAB — BLOOD GAS, ARTERIAL
Acid-Base Excess: 31.1 mmol/L — ABNORMAL HIGH (ref 0.0–2.0)
Bicarbonate: 59.1 mmol/L — ABNORMAL HIGH (ref 20.0–28.0)
Bicarbonate: 59 mmol/L — ABNORMAL HIGH (ref 20.0–28.0)
Delivery systems: POSITIVE
Delivery systems: POSITIVE
Drawn by: 295031
Drawn by: 225631
Expiratory PAP: 6
FIO2: 35
Inspiratory PAP: 12
MECHVT: 0.49 mL
Mode: POSITIVE
O2 Content: 5 L/min
O2 Saturation: 73.9 %
O2 Saturation: 92.7 %
Patient temperature: 98.6
Patient temperature: 98.5
RATE: 18 resp/min
pCO2 arterial: 86.2 mmHg (ref 32.0–48.0)
pCO2 arterial: 77.5 mmHg (ref 32.0–48.0)
pH, Arterial: 7.45 (ref 7.350–7.450)
pH, Arterial: 7.493 — ABNORMAL HIGH (ref 7.350–7.450)
pO2, Arterial: 41.4 mmHg — ABNORMAL LOW (ref 83.0–108.0)
pO2, Arterial: 62.4 mmHg — ABNORMAL LOW (ref 83.0–108.0)

## 2018-09-12 LAB — BASIC METABOLIC PANEL
BUN: 52 mg/dL — ABNORMAL HIGH (ref 8–23)
CO2: >50 mmol/L — ABNORMAL HIGH (ref 22–32)
Calcium: 8.4 mg/dL — ABNORMAL LOW (ref 8.9–10.3)
Chloride: 77 mmol/L — ABNORMAL LOW (ref 98–111)
Creatinine, Ser: 0.9 mg/dL (ref 0.44–1.00)
GFR calc Af Amer: >60 mL/min (ref >60)
GFR calc non Af Amer: >60 mL/min (ref >60)
Glucose, Bld: 78 mg/dL (ref 70–99)
Potassium: 2.9 mmol/L — ABNORMAL LOW (ref 3.5–5.1)
Sodium: 140 mmol/L (ref 135–145)

## 2018-09-12 LAB — CBC WITH DIFFERENTIAL/PLATELET
Abs Immature Granulocytes: 0.03 10*3/uL (ref 0.00–0.07)
Basophils Absolute: 0 10*3/uL (ref 0.0–0.1)
Basophils Relative: 0 %
Eosinophils Absolute: 0 10*3/uL (ref 0.0–0.5)
Eosinophils Relative: 1 %
HCT: 27.3 % — ABNORMAL LOW (ref 36.0–46.0)
Hemoglobin: 7.4 g/dL — ABNORMAL LOW (ref 12.0–15.0)
Immature Granulocytes: 1 %
Lymphocytes Relative: 8 %
Lymphs Abs: 0.5 10*3/uL — ABNORMAL LOW (ref 0.7–4.0)
MCH: 23.3 pg — ABNORMAL LOW (ref 26.0–34.0)
MCHC: 27.1 g/dL — ABNORMAL LOW (ref 30.0–36.0)
MCV: 86.1 fL (ref 80.0–100.0)
Monocytes Absolute: 0.4 10*3/uL (ref 0.1–1.0)
Monocytes Relative: 6 %
Neutro Abs: 5.1 10*3/uL (ref 1.7–7.7)
Neutrophils Relative %: 84 %
Platelets: 66 10*3/uL — ABNORMAL LOW (ref 150–400)
RBC: 3.17 MIL/uL — ABNORMAL LOW (ref 3.87–5.11)
RDW: 18.5 % — ABNORMAL HIGH (ref 11.5–15.5)
WBC: 6 10*3/uL (ref 4.0–10.5)
nRBC: 0 % (ref 0.0–0.2)

## 2018-09-12 LAB — GLUCOSE, CAPILLARY
Glucose-Capillary: 76 mg/dL (ref 70–99)
Glucose-Capillary: 64 mg/dL — ABNORMAL LOW (ref 70–99)
Glucose-Capillary: 112 mg/dL — ABNORMAL HIGH (ref 70–99)
Glucose-Capillary: 153 mg/dL — ABNORMAL HIGH (ref 70–99)
Glucose-Capillary: 170 mg/dL — ABNORMAL HIGH (ref 70–99)
Glucose-Capillary: 133 mg/dL — ABNORMAL HIGH (ref 70–99)
Glucose-Capillary: 137 mg/dL — ABNORMAL HIGH (ref 70–99)

## 2018-09-12 MED ORDER — FUROSEMIDE 40 MG PO TABS
40.0000 mg | ORAL_TABLET | Freq: Two times a day (BID) | ORAL | Status: DC
  Administered 2018-09-12 – 2018-09-16 (×9): 40 mg via ORAL
  Filled 2018-09-12 (×9): qty 1

## 2018-09-12 MED ORDER — OXYCODONE-ACETAMINOPHEN 5-325 MG PO TABS
1.0000 | ORAL_TABLET | Freq: Four times a day (QID) | ORAL | Status: DC | PRN
  Administered 2018-09-12 – 2018-09-16 (×9): 1 via ORAL
  Filled 2018-09-12 (×9): qty 1

## 2018-09-12 MED ORDER — ADULT MULTIVITAMIN W/MINERALS CH
1.0000 | ORAL_TABLET | Freq: Every day | ORAL | Status: DC
  Administered 2018-09-12 – 2018-09-16 (×5): 1 via ORAL
  Filled 2018-09-12 (×5): qty 1

## 2018-09-12 MED ORDER — ENSURE ENLIVE PO LIQD
237.0000 mL | Freq: Two times a day (BID) | ORAL | Status: DC
  Administered 2018-09-13: 237 mL via ORAL

## 2018-09-12 MED ORDER — PREDNISONE 5 MG PO TABS
10.0000 mg | ORAL_TABLET | Freq: Every day | ORAL | Status: DC
  Administered 2018-09-13 – 2018-09-16 (×4): 10 mg via ORAL
  Filled 2018-09-12 (×4): qty 2

## 2018-09-12 MED ORDER — OXYCODONE HCL 5 MG PO TABS
2.5000 mg | ORAL_TABLET | Freq: Four times a day (QID) | ORAL | Status: DC | PRN
  Administered 2018-09-12 – 2018-09-16 (×9): 2.5 mg via ORAL
  Filled 2018-09-12 (×9): qty 1

## 2018-09-12 MED ORDER — INSULIN DETEMIR 100 UNIT/ML ~~LOC~~ SOLN
10.0000 [IU] | Freq: Two times a day (BID) | SUBCUTANEOUS | Status: DC
  Administered 2018-09-12 – 2018-09-16 (×8): 10 [IU] via SUBCUTANEOUS
  Filled 2018-09-12 (×9): qty 0.1

## 2018-09-12 MED ORDER — FLEET ENEMA 7-19 GM/118ML RE ENEM
1.0000 | ENEMA | Freq: Once | RECTAL | Status: AC
  Administered 2018-09-12: 1 via RECTAL
  Filled 2018-09-12: qty 1

## 2018-09-12 MED ORDER — BOOST / RESOURCE BREEZE PO LIQD CUSTOM
1.0000 | Freq: Two times a day (BID) | ORAL | Status: DC
  Administered 2018-09-12 – 2018-09-14 (×3): 1 via ORAL

## 2018-09-12 MED ORDER — DEXTROSE 50 % IV SOLN
INTRAVENOUS | Status: AC
  Filled 2018-09-12: qty 50

## 2018-09-12 MED ORDER — POTASSIUM CHLORIDE 10 MEQ/100ML IV SOLN
10.0000 meq | INTRAVENOUS | Status: AC
  Administered 2018-09-12 (×6): 10 meq via INTRAVENOUS
  Filled 2018-09-12 (×7): qty 100

## 2018-09-12 NOTE — Progress Notes (Signed)
PROGRESS NOTE    Renee Noble  DZH:[id number] DOB: 1955/09/30 DOA: 08/28/2018 PCP: [name], PA-C   Brief Narrative: Patient is a 63 year old female with history of chronic hypoxic respiratory failure on 3 L oxygen at home, severe COPD, chronic diastolic CHF, diabetes type 2, obesity who presents to [facility] with acute on chronic diastolic heart failure, COPD exacerbation.  Patient had 3 to 4 days of progressive shortness of breath and orthopnea.  Patient was transferred to [facility] for COVID rule out.  COVID-19 came out to be negative twice.  On 08/30/2015 she became hypercapnic with increased work of breathing necessitating intubation and mechanical ventilation.  Extubated to nasal cannula on 09/01/2018.  She continued to remain noncompliant to fluid restriction and CPAP.  She was being planned for discharge but had to be  intubated again on 09/06/2018 and was moved to ICU due to sudden onset of lethargy due to hypercapnea.  Extubated on 09/09/18.Physical therapy evaluated her and recommended skilled nursing facility on discharge.SW following. Planning for ensuring more compliance, monitor for 1-2 days more on the telemetry and discharge to SNF with mandatory bipap/cpap at night.  Assessment & Plan:   Principal Problem:   Acute on chronic respiratory failure with hypercapnia (HCC) Active Problems:   Type II diabetes mellitus with renal manifestations ([place])   Encounter for orogastric (OG) tube placement   Chronic pain syndrome   COPD, group D, by GOLD 2017 classification ([place])   Acute on chronic diastolic CHF (congestive heart failure) (HCC)   Acute on chronic congestive heart failure (HCC)   Hypothyroidism   Acute exacerbation of chronic obstructive pulmonary disease (COPD) ([place])   Atrial flutter with rapid ventricular response (HCC)   AKI (acute kidney injury) ([place])   Pressure ulcer   Endotracheal tube present   Palliative care encounter  Acute on chronic  respiratory failure with hypoxia and hypercapnia: Secondary to diastolic CHF, COPD, obesity hypoventilation syndrome, sleep apnea. She has been intubated twice during this hospitalization.  She is on 3 L oxygen via nasal cannula at home.  Continue bronchodilators as needed. ABG done yesterday showed hypercapnia with CO2 of 90. We continued   BiPAP throughout yesterday.ABG this morning showed pCO2 of 77 and pH of 7.49. She is a chronic retainer and this CO2 might be her baseline. I have discussed with her about the importance of using CPAP or BiPAP at night indefinitely.  Suspicion for COVID-19: Ruled out.  Negative tests x2.  Acute on chronic diastolic heart failure: TTE on 11/19 showed ejection fraction of 65%.  Presented with 30 pounds of weight gain.  She is very noncompliant with fluid intake.  Constantly asked for increasing the limit of fluid intake.  She was started on Lasix drip here.  Currently on lasix 40 mg daily BID.  She was on torsemide 20 mg daily at home.Bilateral lower extremity edema have improved. We will continue to monitor input/output, daily weight.  Fluid restriction to less than 1.5 L a day. She should follow-up with cardiology as an outpatient.  COPD exacerbation: Gold 4.FEV1 30%.  Continue current bronchodilators.  She is on steroid at home.Prednisone tapered to 10 mg daily which she can continue on DC. Follow-up with pulmonology as an outpatient.  Obesity hypoventilation syndrome/sleep apnea:Very noncompliant nocturnal CPAP/bipap.  I have again stressed the importance of using  BiPAP /Cpap at night.Continue Bipap at night while she is here.  Hypokalemia: Secondary to Lasix.  Continue supplementation  Paroxysmal A. fib: Currently  rate is controlled.  Anticoagulated with Eliquis.  On Cardizem for rate control  AKI on CKD stage III: Baseline creatinine of 1.2.  Creatinine on baseline now.  Continue to monitor.  She has H/O  left nephrectomy.  Anemia/Thrombocytopenia: Most  likely associated with chronic medical problems.  Currently stable.  Continue to monitor.No need for transfusion today.  Diabetes mellitus type 2: Continue Lantus and sliding scale insulin  Hypothyroidism: Continue Synthyroid  Pressure injury Left/right ischial tuberosities, present on admission  Debility/deconditioning: Patient evaluated by physical therapy and recommended skilled nursing facility on discharge.SW following.  Right upper lobe pulmonary nodule: Recommended CT chest in June/July 2020.  Multiple intubations/noncompliance/multiple comorbidities: Pallliative care was following.  Reconsulted again for discussing on goals of care and CODE STATUS. Remains full code despite multiple discussions.  Constipation : Continue miralax.   Nutrition Problem: Increased nutrient needs Etiology: acute illness      DVT prophylaxis: Eliquis Code Status: Full Family Communication: Discussed with cousin [name] on phone Disposition Plan: Skilled nursing facility after her respiratory status remains stable for 1-2 more days.   Consultants: PCCM  Procedures: Intubation x 2  Antimicrobials:  Anti-infectives (From admission, onward)   Start     Dose/Rate Route Frequency Ordered Stop   09/02/18 1100  amoxicillin-clavulanate (AUGMENTIN) 875-125 MG per tablet 1 tablet  Status:  Discontinued     1 tablet Oral Every 12 hours 09/02/18 1006 09/06/18 1401   08/30/18 2000  cefTRIAXone (ROCEPHIN) 1 g in sodium chloride 0.9 % 100 mL IVPB  Status:  Discontinued     1 g 200 mL/hr over 30 Minutes Intravenous Every 24 hours 08/30/18 1947 09/02/18 0919   08/30/18 2000  azithromycin (ZITHROMAX) 500 mg in sodium chloride 0.9 % 250 mL IVPB  Status:  Discontinued     500 mg 250 mL/hr over 60 Minutes Intravenous Every 24 hours 08/30/18 1947 09/02/18 0919      Subjective: Patient seen and examined the bedside this morning.  Hemodynamically stable.  ABG showed PCO2 more than 90 today.  She did not  want  continuous BiPAP at night.  She says she cannot sleep with BiPAP.  Again stressed the importance of using BiPAP at night.  Complains of constipation.  Objective: Vitals:   09/12/18 0400 09/12/18 0403 09/12/18 0500 09/12/18 0800  BP:      Pulse: 85  74   Resp: 18  12   Temp:  98.5 F (36.9 C)  98.1 F (36.7 C)  TempSrc:  Axillary  Oral  SpO2: 98%  98%   Weight:   121.5 kg   Height:        Intake/Output Summary (Last 24 hours) at 09/12/2018 1110 Last data filed at 09/12/2018 0951 Gross per 24 hour  Intake 949.09 ml  Output 4450 ml  Net -3500.91 ml   Filed Weights   09/10/18 0500 09/11/18 0500 09/12/18 0500  Weight: 125.4 kg 124.4 kg 121.5 kg    Examination:   General exam: Appears calm and comfortable ,Not in distress, morbidly obese  HEENT:PERRL,Oral mucosa moist, Ear/Nose normal on gross exam Respiratory system: Bilateral decreased air entry Cardiovascular system: Afib. No JVD, murmurs, rubs, gallops or clicks. Gastrointestinal system: Abdomen is obese, soft and nontender.  Ventral hernia on the left subcostal region, abdominal wall edema. Central nervous system: Alert and oriented. No focal neurological deficits. Extremities: 1-2 + bilateral lower extremity edema, no clubbing ,no cyanosis, distal peripheral pulses palpable. Skin: Chronic venous stasis changes on bilateral lower extremities  Data Reviewed: I have personally reviewed following labs and imaging studies  CBC: Recent Labs  Lab 09/06/18 2330 09/08/18 0420 09/09/18 0301 09/10/18 0737 09/12/18 0500  WBC 6.5 5.1 11.1* 10.0 6.0  NEUTROABS 6.1  --   --  8.7* 5.1  HGB 8.0* 7.5* 8.7* 8.2* 7.4*  HCT 28.6* 25.2* 29.8* 28.7* 27.3*  MCV 85.6 81.6 82.8 85.2 86.1  PLT 60* 73* 87* 81* 66*   Basic Metabolic Panel: Recent Labs  Lab 09/06/18 2330 09/07/18 0928 09/07/18 1800 09/08/18 0420 09/08/18 1700 09/09/18 0301 09/10/18 0737 09/11/18 0400 09/12/18 0500  NA 134* 135  --  138  --  138 137 139  140  K 4.3 3.6  --  2.8*  --  3.0* 2.6* 2.9* 2.9*  CL 85* 86*  --  85*  --  81* 76* 77* 77*  CO2 37* 38*  --  42*  --  46* 50* >50* >50*  GLUCOSE 132* 125*  --  246*  --  106* 109* 79 78  BUN 98* 121*  --  126*  --  113* 89* 71* 52*  CREATININE 1.70* 1.73*  --  1.63*  --  1.39* 1.13* 0.99 0.90  CALCIUM 7.9* 7.8*  --  7.7*  --  8.3* 8.3* 8.6* 8.4*  MG 1.8 1.7 1.8 1.9 1.7  --   --  1.8  --   PHOS 5.8* 4.2 3.6 3.3 3.0  --   --   --   --    GFR: Estimated Creatinine Clearance: 80.1 mL/min (by C-G formula based on SCr of 0.9 mg/dL). Liver Function Tests: Recent Labs  Lab 09/08/18 0420 09/09/18 0301  AST 14* 21  ALT 14 17  ALKPHOS 37* 55  BILITOT 0.8 0.8  PROT 5.0* 5.8*  ALBUMIN 3.0* 3.4*   No results for input(s): LIPASE, AMYLASE in the last 168 hours. No results for input(s): AMMONIA in the last 168 hours. Coagulation Profile: No results for input(s): INR, PROTIME in the last 168 hours. Cardiac Enzymes: No results for input(s): CKTOTAL, CKMB, CKMBINDEX, TROPONINI in the last 168 hours. BNP (last 3 results) No results for input(s): PROBNP in the last 8760 hours. HbA1C: No results for input(s): HGBA1C in the last 72 hours. CBG: Recent Labs  Lab 09/11/18 1940 09/11/18 2321 09/12/18 0314 09/12/18 0741 09/12/18 0839  GLUCAP 87 94 76 64* 112*   Lipid Profile: No results for input(s): CHOL, HDL, LDLCALC, TRIG, CHOLHDL, LDLDIRECT in the last 72 hours. Thyroid Function Tests: No results for input(s): TSH, T4TOTAL, FREET4, T3FREE, THYROIDAB in the last 72 hours. Anemia Panel: No results for input(s): VITAMINB12, FOLATE, FERRITIN, TIBC, IRON, RETICCTPCT in the last 72 hours. Sepsis Labs: No results for input(s): PROCALCITON, LATICACIDVEN in the last 168 hours.  No results found for this or any previous visit (from the past 240 hour(s)).       Radiology Studies: Dg Chest Port 1 View  Result Date: 09/11/2018 CLINICAL DATA:  63 year old female with PICC placement EXAM:  PORTABLE CHEST 1 VIEW COMPARISON:  09/11/2018 FINDINGS: Clavicles are midline on the current chest x-ray. The tip of the PICC terminates just to the left of midline. Left subclavian central venous catheter again terminates in the region of the brachiocephalic vein. Hazy opacities at the bilateral lung bases are unchanged with blunting of the bilateral costophrenic angles and obscuration of the bilateral hemidiaphragm. No pneumothorax. IMPRESSION: The right upper extremity PICC again terminates with the tip terminating over the midline. The plain film is  indeterminate for specifically locating the tip. If there is any further concern, correlation with blood gas or pressure transduction may be useful. Similar appearance of bilateral pleural effusions and associated atelectasis/consolidation Unchanged left subclavian central line Electronically Signed   By: Corrie Mckusick D.O.   On: 09/11/2018 20:42   Dg Chest Port 1 View  Result Date: 09/11/2018 CLINICAL DATA:  63 year old female with central line placement EXAM: PORTABLE CHEST 1 VIEW COMPARISON:  09/09/2018, 09/08/2018 FINDINGS: Cardiomediastinal silhouette unchanged in size and contour with cardiomegaly. Interval placement of right upper extremity PICC, with the tip terminating on the left aspect of the spine. There is left rotation the patient. Unchanged position of left subclavian central venous catheter with the tip appearing to terminate in the brachiocephalic vein. Hazy opacities of the bilateral lung bases with obscuration of the retrocardiac region of the bilateral cardiophrenic angles. No new airspace opacity. IMPRESSION: Interval placement of right upper extremity PICC. The right rotation of the chest x-ray limits evaluation of the tip of the catheter, which terminates over the left aspect of the spine. While this most likely is projectional, arterial placement cannot be excluded on this plain film. If there is any concern for the location, correlation  with blood gas may be useful, or repeat plain film. Unchanged left subclavian central venous catheter. Similar appearance of pleural effusions with associated atelectasis/consolidation. Electronically Signed   By: Corrie Mckusick D.O.   On: 09/11/2018 19:25   [place] Ekg Site Rite  Result Date: 09/11/2018 If Site Rite image not attached, placement could not be confirmed due to current cardiac rhythm.       Scheduled Meds:  apixaban  5 mg Oral BID   arformoterol  15 mcg Nebulization BID   budesonide (PULMICORT) nebulizer solution  0.5 mg Nebulization BID   Chlorhexidine Gluconate Cloth  6 each Topical Daily   diltiazem  180 mg Oral Daily   feeding supplement  1 Container Oral BID BM   feeding supplement (ENSURE ENLIVE)  237 mL Oral BID BM   feeding supplement (PRO-STAT SUGAR FREE 64)  30 mL Oral Daily   furosemide  40 mg Oral BID   insulin aspart  0-15 Units Subcutaneous Q4H   insulin aspart  5 Units Subcutaneous Q4H   insulin detemir  15 Units Subcutaneous BID   ipratropium-albuterol  3 mL Nebulization TID   levothyroxine  88 mcg Oral Q0600   mouth rinse  15 mL Mouth Rinse BID   multivitamin with minerals  1 tablet Oral Daily   nutrition supplement (JUVEN)  1 packet Oral BID BM   nystatin   Topical TID   pantoprazole  40 mg Oral Daily   polyethylene glycol  17 g Oral Daily   potassium chloride  40 mEq Oral Once   potassium chloride  40 mEq Oral Daily   predniSONE  20 mg Oral Q breakfast   sodium chloride flush  10-40 mL Intracatheter Q12H   Continuous Infusions:  potassium chloride 10 mEq (09/12/18 1000)     LOS: 15 days    Time spent: 35 mins.More than 50% of that time was spent in counseling and/or coordination of care.      Shelly Coss, MD [facility] Pager [phone]  If 7PM-7AM, please contact night-coverage [email or web address] Password [email or web address] 09/12/2018, 11:10 AM

## 2018-09-12 NOTE — Progress Notes (Addendum)
Pt is not ready for nocturnal bipap at this time.  Pt states she wants to read her kindle for another hour or so.  Per RN, she will administer patient's bedtime medication around 2330.  This Probation officer will return after that time to place pt on bipap.

## 2018-09-12 NOTE — Progress Notes (Signed)
Nutrition Follow-up  RD working remotely.   DOCUMENTATION CODES:   Morbid obesity  INTERVENTION:  - continue prostat once/day and juven BID. - will order boost breeze BID, each supplement provides 250 kcal and 9 grams protein.  - will order ensure enlive for once diet advanced to at least FLD, each supplement provides 350 kcal and 20 grams protein. - diet advancement and encouragement of PO intakes as feasible/safe.   NUTRITION DIAGNOSIS:   Increased nutrient needs related to acute illness as evidenced by estimated needs. -revised  GOAL:   Patient will meet greater than or equal to 90% of their needs -unmet at this time  MONITOR:   PO intake, Supplement acceptance, Labs, Weight trends, Skin  ASSESSMENT:   63 y.o. female with medical history significant for chronic hypoxic respiratory failure on 3L O2, severe COPD, CHF, type 2 DM, morbid obesity, and chronic thrombocytopenia. She was admitted to [facility] on 4/13 with acute on chronic hypoxic respiratory failure in the setting of acute on CHF and acute COPD exacerbation. She had presented to [facility] d/t 3-4 days of worsening SOB, 3-5 lb weight gain x1 week, and a chronic non-productive cough. She noted associated expiratory wheezing over 2-3 days. She reported that her cousin, with whom she lives, was diagnosed with pneumonia within the last week, but has been tested twice for COVID-19, with negative results on both occasions.  Significant Events: 4/13: admitted to [facility] 4/14: transferred to [facility] ICU, tested for COVID-19-results negative x2 4/15: intubated, TF was started via protocol 4/17: extubated, SLP evaluated: recommends regular diet, no limitations  4/19: Transferred from ICU to 4W 4/22: Rapid Response; re-intubated and OGT placed 4/24- extubated and OGT removed 4/25- diet advanced to CLD   Estimated nutrition needs updated as patient was extubated on 4/24. Diet advanced to CLD the next day with no intakes  documented since that time. Prostat was ordered once/day with diet advancement and patient has accepted 1 of 3 packets offered. Juven was also ordered BID and patient has accepted 1 of 5 packets.   SLP attempted to work with patient yesterday afternoon but was unable d/t patient requiring BiPAP. Per notes, plan is for SNF once respiratory status is more stable/remains stable.   Medications reviewed; 40 mg oral lasix BID, sliding scale novolog, 5 units novolog every 4 hours, 15 units levemir BID, 88 mcg oral synthroid/day, 2 g IV Mg sulfate x1 run 4/27, 17 g miralax/day, 10 mEq IV KCl x4 runs 4/27 and x6 runs 4/28, 40 mEq K-Dur/day, 20 mg deltasone/day. Labs reviewed; CBGs: 76 and 64 mg/dl today, K: 2.0 mmol/l, Ca: 7.7 mg/dl, BUN: 52 mg/dl, Ca: 8.4 mg/dl.      Diet Order:   Diet Order            Diet clear liquid Room service appropriate? Yes; Fluid consistency: Thin  Diet effective now              EDUCATION NEEDS:   Not appropriate for education at this time  Skin:  Skin Assessment: Skin Integrity Issues: Skin Integrity Issues:: Stage II Stage II: bilateral IT  Last BM:  4/27  Height:   Ht Readings from Last 1 Encounters:  09/06/18 5\' 4"  (1.626 m)    Weight:   Wt Readings from Last 1 Encounters:  09/12/18 121.5 kg    Ideal Body Weight:  54.54 kg  BMI:  Body mass index is 45.98 kg/m.  Estimated Nutritional Needs:   Kcal:  2300-2550 kcal  Protein:  120-130 grams  Fluid:  >/= 2 L/day     Jarome Matin, MS, RD, LDN, [facility] Inpatient Clinical Dietitian Pager # [phone] After hours/weekend pager # [phone]

## 2018-09-12 NOTE — Progress Notes (Signed)
Occupational Therapy Treatment Patient Details Name: Renee Noble MRN: [id number] DOB: 01/02/1956 Today's Date: 09/12/2018    History of present illness Pt admitted through ED 08/28/18 2* acute on chronic respiratory failure.  Pt requiring intubation and extubated 09/01/18. Intubated 09/06/18 due to evolving hypercapnic respiratory failure and extubated 09/08/18. Pt wtih hx of syncope, DM, Fibromyalgia, CHF, and nephrectomy   OT comments  Pt limited by nausea.  Goals were downgraded today. Pt was reintubated/extubated since goals were written Sat EOB and performed AROM exercises  Follow Up Recommendations  SNF    Equipment Recommendations  (defer to next venue)    Recommendations for Other Services      Precautions / Restrictions Precautions Precautions: Fall Precaution Comments: home O2 (3.5 liters at rest, 4 with activity per pt) Restrictions Weight Bearing Restrictions: No       Mobility Bed Mobility     Rolling: Total assist;+2 for physical assistance Sidelying to sit: Total assist;+2 for physical assistance     Sit to sidelying: Total assist;+2 for physical assistance General bed mobility comments: pt did try to assist:  pt approximately 15-20% of rolling and sidelying>sit  Transfers                      Balance     Sitting balance-Leahy Scale: Poor Sitting balance - Comments: needs bil UE support and tends to lean posteriorly. Could not tolerate sitting long due to nausea                                   ADL either performed or assessed with clinical judgement   ADL                                               Vision       Perception     Praxis      Cognition Arousal/Alertness: Awake/alert Behavior During Therapy: WFL for tasks assessed/performed Overall Cognitive Status: Within Functional Limits for tasks assessed                                          Exercises General Exercises -  Upper Extremity Shoulder Flexion: 5 reps;Both   Shoulder Instructions       General Comments      Pertinent Vitals/ Pain       Pain Assessment: Faces Faces Pain Scale: Hurts even more Pain Location: back and neck/chronic; Bil knees Pain Descriptors / Indicators: Sore;Aching;Constant Pain Intervention(s): Limited activity within patient's tolerance;Monitored during session;Repositioned  Home Living                                          Prior Functioning/Environment              Frequency  Min 2X/week        Progress Toward Goals  OT Goals(current goals can now be found in the care plan section)  Progress towards OT goals: Goals drowngraded-see care plan  Acute Rehab OT Goals OT Goal Formulation: With patient Time For Goal Achievement: 09/26/18 Potential to Achieve Goals:  Fair ADL Goals Pt Will Perform Grooming: with min assist;sitting Pt Will Perform Upper Body Dressing: with min assist;sitting Pt/caregiver will Perform Home Exercise Program: Increased ROM;Increased strength;Both right and left upper extremity(arom, 1 set of 10 x 3 shoulder motions) Additional ADL Goal #1: pt will perform bed mobility with max +2 assist in preparation for adls Additional ADL Goal #2: pt will tolerate sitting eob with min guard assist x 4 minutes in preparation for seated adls  Plan      Co-evaluation                 AM-PAC OT "6 Clicks" Daily Activity     Outcome Measure   Help from another person eating meals?: None Help from another person taking care of personal grooming?: A Lot Help from another person toileting, which includes using toliet, bedpan, or urinal?: Total Help from another person bathing (including washing, rinsing, drying)?: A Lot Help from another person to put on and taking off regular upper body clothing?: A Lot Help from another person to put on and taking off regular lower body clothing?: Total 6 Click Score: 12    End of  Session    OT Visit Diagnosis: Muscle weakness (generalized) (M62.81);Pain   Activity Tolerance (limited by nausea)   Patient Left in bed;with call bell/phone within reach   Nurse Communication          Time: 3335-4562 OT Time Calculation (min): 19 min  Charges: OT General Charges $OT Visit: 1 Visit OT Treatments $Therapeutic Activity: 8-22 mins  Lesle Chris, OTR/L Acute Rehabilitation Services [phone] [place] pager [phone] office 09/12/2018   [place] 09/12/2018, 3:40 PM

## 2018-09-12 NOTE — Progress Notes (Signed)
Rt placed pt on CPAP at pt request with 4 LPM bleed in.

## 2018-09-12 NOTE — Progress Notes (Signed)
Inpatient Diabetes Program Recommendations  AACE/ADA: New Consensus Statement on Inpatient Glycemic Control (2015)  Target Ranges:  Prepandial:   less than 140 mg/dL      Peak postprandial:   less than 180 mg/dL (1-2 hours)      Critically ill patients:  140 - 180 mg/dL   Lab Results  Component Value Date   GLUCAP 112 (H) 09/12/2018   HGBA1C 5.6 06/12/2018    Review of Glycemic Control Results for Renee Noble, Renee Noble (MRN [id number]) as of 09/12/2018 11:13  Ref. Range 09/11/2018 03:44 09/11/2018 08:17 09/11/2018 12:19 09/11/2018 16:26 09/11/2018 17:10 09/11/2018 19:40 09/11/2018 23:21 09/12/2018 03:14 09/12/2018 07:41 09/12/2018 08:39  Glucose-Capillary Latest Ref Range: 70 - 99 mg/dL 86 91 80 66 (L) 94 87 94 76 64 (L) 112 (H)   Inpatient Diabetes Program Recommendations:   Noted hypoglycemia. -Decrease Levemir to 10 units bid -D/C tube feed coverage  Thank you, Bethena Roys E. Rashae Rother, RN, MSN, CDE  Diabetes Coordinator Inpatient Glycemic Control Team Team Pager [phone] (8am-5pm) 09/12/2018 11:21 AM

## 2018-09-12 NOTE — Progress Notes (Signed)
  Speech Language Pathology Treatment: Dysphagia  Patient Details Name: Renee Noble MRN: [id number] DOB: 05-16-1956 Today's Date: 09/12/2018 Time: 3709-6438 SLP Time Calculation (min) (ACUTE ONLY): 23 min  Assessment / Plan / Recommendation Clinical Impression  Pt complaining of nausea and constipation.   She declines to consume any po intake with this SLP.  Denies any dysphagia including coughing with intake nor sensing food lodging. Reviewed her h/o degenerative disc disease impacting C3-C7 and thus advised her to continue to monitor tolerance closely.   Pt able to independently articulate all precautions reviewed including universal sign of choking, improved airway protection with exhalation post-swallow, importance of assuring not dyspneic with po due to reciprocity of swallow/respirations.  Xerostomia precautions reviewed also.  As pt with excellent recollection of s/s of dysphagia and compensations, no follow up indicated.  Thanks for this consult.    HPI HPI: Patient is a 63 y.o. female with PMH: fibromyalgia, CHF, nephrectomy, chronic hypoxic respiratory failure on 3.5 L continuous nasal cannula O2 at home for several years, obesity, DM-2, chronic diastolic heart failure, who presented to ED at [facility] with c/o SOB. for 3-4 days prior. She was tested twice for Covid with results negative both times. She became hypercapnic with increased WOB and was intubated on 4/15, extubated morning of 4/17.  Passed swallow evaluation.  Became hyerpcanpnic and refused CPAP usage thus required repeat intubation 4/22-4/24/2020.  Swallow evaluation reordered. Pt underwent clinical swallow evaluation on Sat 4/25 and follow up indicated.  She denies dysphagia, sensing lodging phayrnx or "choking" during intake.        SLP Plan  All goals met       Recommendations  Diet recommendations: Regular;Thin liquid Liquids provided via: Cup;Straw Medication Administration: Whole meds with liquid Compensations:  Minimize environmental distractions;Small sips/bites;Slow rate;Other (Comment)(strict esophageal precautions) Postural Changes and/or Swallow Maneuvers: Seated upright 90 degrees;Upright 30-60 min after meal                Oral Care Recommendations: Oral care BID Follow up Recommendations: None SLP Visit Diagnosis: Dysphagia, unspecified (R13.10) Plan: All goals met       GO                Macario Golds 09/12/2018, 1:52 PM  Luanna Salk, [place] [facility] SLP Acute Rehab Services Pager [phone] Office [phone]

## 2018-09-12 NOTE — Progress Notes (Signed)
Pt placed on nocturnal bipap, 14/6 with 4l o2.  Pt tolerating it well at this time.  RN aware, present in room.  RT will monitor and assess as needed.

## 2018-09-12 NOTE — Progress Notes (Signed)
CRITICAL VALUE ALERT  Critical Value:  ABG, Bicarb  * Date & Time Notied:  09/12/18. 0520   Provider Notified: Lamar Blinks NP   Orders Received/Actions taken No new orders

## 2018-09-13 LAB — CBC WITH DIFFERENTIAL/PLATELET
Abs Immature Granulocytes: 0.05 10*3/uL (ref 0.00–0.07)
Basophils Absolute: 0 10*3/uL (ref 0.0–0.1)
Basophils Relative: 0 %
Eosinophils Absolute: 0 10*3/uL (ref 0.0–0.5)
Eosinophils Relative: 0 %
HCT: 28.7 % — ABNORMAL LOW (ref 36.0–46.0)
Hemoglobin: 8.1 g/dL — ABNORMAL LOW (ref 12.0–15.0)
Immature Granulocytes: 1 %
Lymphocytes Relative: 5 %
Lymphs Abs: 0.5 10*3/uL — ABNORMAL LOW (ref 0.7–4.0)
MCH: 24.1 pg — ABNORMAL LOW (ref 26.0–34.0)
MCHC: 28.2 g/dL — ABNORMAL LOW (ref 30.0–36.0)
MCV: 85.4 fL (ref 80.0–100.0)
Monocytes Absolute: 0.6 10*3/uL (ref 0.1–1.0)
Monocytes Relative: 6 %
Neutro Abs: 9.2 10*3/uL — ABNORMAL HIGH (ref 1.7–7.7)
Neutrophils Relative %: 88 %
Platelets: 63 10*3/uL — ABNORMAL LOW (ref 150–400)
RBC: 3.36 MIL/uL — ABNORMAL LOW (ref 3.87–5.11)
RDW: 18.6 % — ABNORMAL HIGH (ref 11.5–15.5)
WBC: 10.4 10*3/uL (ref 4.0–10.5)
nRBC: 0 % (ref 0.0–0.2)

## 2018-09-13 LAB — BLOOD GAS, ARTERIAL
Acid-Base Excess: 28.5 mmol/L — ABNORMAL HIGH (ref 0.0–2.0)
Acid-Base Excess: 26.9 mmol/L — ABNORMAL HIGH (ref 0.0–2.0)
Bicarbonate: 56.5 mmol/L — ABNORMAL HIGH (ref 20.0–28.0)
Bicarbonate: 54.3 mmol/L — ABNORMAL HIGH (ref 20.0–28.0)
Delivery systems: POSITIVE
Drawn by: 225631
O2 Content: 4 L/min
O2 Content: 4 L/min
O2 Saturation: 93.3 %
O2 Saturation: 64.7 %
Patient temperature: 37.2
Patient temperature: 98.6
pCO2 arterial: 83.1 mmHg (ref 32.0–48.0)
pCO2 arterial: 74.9 mmHg (ref 32.0–48.0)
pH, Arterial: 7.448 (ref 7.350–7.450)
pH, Arterial: 7.474 — ABNORMAL HIGH (ref 7.350–7.450)
pO2, Arterial: 68.1 mmHg — ABNORMAL LOW (ref 83.0–108.0)
pO2, Arterial: 34.8 mmHg — CL (ref 83.0–108.0)

## 2018-09-13 LAB — GLUCOSE, CAPILLARY
Glucose-Capillary: 160 mg/dL — ABNORMAL HIGH (ref 70–99)
Glucose-Capillary: 162 mg/dL — ABNORMAL HIGH (ref 70–99)
Glucose-Capillary: 166 mg/dL — ABNORMAL HIGH (ref 70–99)
Glucose-Capillary: 192 mg/dL — ABNORMAL HIGH (ref 70–99)
Glucose-Capillary: 65 mg/dL — ABNORMAL LOW (ref 70–99)
Glucose-Capillary: 67 mg/dL — ABNORMAL LOW (ref 70–99)
Glucose-Capillary: 79 mg/dL (ref 70–99)

## 2018-09-13 LAB — BASIC METABOLIC PANEL
Anion gap: 10 (ref 5–15)
BUN: 48 mg/dL — ABNORMAL HIGH (ref 8–23)
CO2: 48 mmol/L — ABNORMAL HIGH (ref 22–32)
Calcium: 8.3 mg/dL — ABNORMAL LOW (ref 8.9–10.3)
Chloride: 80 mmol/L — ABNORMAL LOW (ref 98–111)
Creatinine, Ser: 0.94 mg/dL (ref 0.44–1.00)
GFR calc Af Amer: >60 mL/min (ref >60)
GFR calc non Af Amer: >60 mL/min (ref >60)
Glucose, Bld: 72 mg/dL (ref 70–99)
Potassium: 3.1 mmol/L — ABNORMAL LOW (ref 3.5–5.1)
Sodium: 138 mmol/L (ref 135–145)

## 2018-09-13 MED ORDER — POTASSIUM CHLORIDE CRYS ER 20 MEQ PO TBCR
40.0000 meq | EXTENDED_RELEASE_TABLET | Freq: Two times a day (BID) | ORAL | Status: DC
  Administered 2018-09-13 – 2018-09-16 (×6): 40 meq via ORAL
  Filled 2018-09-13 (×6): qty 2

## 2018-09-13 NOTE — Progress Notes (Signed)
Right upper arm PICC placed with ECG technology verifying PICC tip in SVC with elevated P waves. Spoke with Abby, RN, stated, " PICC line is good to use and labs should be drawn from PICC. Will continue to monitor.

## 2018-09-13 NOTE — Progress Notes (Signed)
Patient requesting Bipap removal at this time. States, "I want to eat breakfast and then I will put it back on." RN removed bipap, Carnella [place] I

## 2018-09-13 NOTE — TOC Initial Note (Signed)
Transition of Care [facility]) - Initial/Assessment Note    Patient Details  Name: Renee Noble MRN: [id number] Date of Birth: [birth year]  Transition of Care [facility]) CM/SW Contact:    [name], RN Phone Number: 09/13/2018, 3:45 PM  Clinical Narrative: Patient has been declined @ [facility].Per patient request TC cousin Venancio Poisson [phone] voicemail set up. Spoke to patient about current bed offers-[facility], [facility]-patient provided w/listing-awaiting choice.                  Expected Discharge Plan: Skilled Nursing Facility Barriers to Discharge: Other (comment)(Await patient choice for SNF.)   Patient Goals and CMS Choice Patient states their goals for this hospitalization and ongoing recovery are:: go to rehab CMS Medicare.gov Compare Post Acute Care list provided to:: Patient Choice offered to / list presented to : Patient  Expected Discharge Plan and Services Expected Discharge Plan: [place]   Discharge Planning Services: CM Consult                                          Prior Living Arrangements/Services   Lives with:: Relatives Patient language and need for interpreter reviewed:: Yes Do you feel safe going back to the place where you live?: Yes      Need for Family Participation in Patient Care: No (Comment) Care giver support system in place?: Yes (comment) Current home services: DME(cpap) Criminal Activity/Legal Involvement Pertinent to Current Situation/Hospitalization: No - Comment as needed  Activities of Daily Living Home Assistive Devices/Equipment: Environmental consultant (specify type), Eyeglasses, Shower chair with back ADL Screening (condition at time of admission) Patient's cognitive ability adequate to safely complete daily activities?: Yes Is the patient deaf or have difficulty hearing?: No Does the patient have difficulty seeing, even when wearing  glasses/contacts?: No(pt wears glasses for reading) Does the patient have difficulty concentrating, remembering, or making decisions?: No Patient able to express need for assistance with ADLs?: Yes Does the patient have difficulty dressing or bathing?: Yes Independently performs ADLs?: No Communication: Independent Dressing (OT): Needs assistance Is this a change from baseline?: Pre-admission baseline Grooming: Needs assistance Is this a change from baseline?: Pre-admission baseline Feeding: Independent Bathing: Needs assistance Is this a change from baseline?: Pre-admission baseline Toileting: Needs assistance Is this a change from baseline?: Pre-admission baseline In/Out Bed: Needs assistance Is this a change from baseline?: Pre-admission baseline Walks in Home: Needs assistance Is this a change from baseline?: Pre-admission baseline Does the patient have difficulty walking or climbing stairs?: Yes Weakness of Legs: Both Weakness of Arms/Hands: None  Permission Sought/Granted Permission sought to share information with : Case Manager Permission granted to share information with : Yes, Verbal Permission Granted  Share Information with NAME: Venancio Poisson  Permission granted to share info w AGENCY: SNF  Permission granted to share info w Relationship: cousin  Permission granted to share info w Contact Information: [phone]  Emotional Assessment Appearance:: Appears stated age Attitude/Demeanor/Rapport: Engaged Affect (typically observed): Accepting Orientation: : Oriented to Self, Oriented to Place, Oriented to  Time, Oriented to Situation Alcohol / Substance Use: Tobacco Use    Admission diagnosis:  Atrial flutter with rapid ventricular response (HCC) [I48.92] Chronic obstructive pulmonary disease, unspecified COPD type ([place]) [J44.9] Acute on chronic congestive heart failure, unspecified heart failure type [facility]) [I50.9] Patient Active Problem List   Diagnosis Date  Noted  .  Palliative care encounter   . Endotracheal tube present   . Pressure ulcer 08/30/2018  . Acute exacerbation of chronic obstructive pulmonary disease (COPD) ([place]) 08/28/2018  . Atrial flutter with rapid ventricular response ([place]) 08/28/2018  . AKI (acute kidney injury) ([place]) 08/28/2018  . Wound of thigh 08/15/2018  . Pressure injury of skin 07/17/2018  . Hypoglycemia due to insulin 07/14/2018  . Acute on chronic respiratory failure with hypercapnia ([place]) 06/09/2018  . HLD (hyperlipidemia) 06/09/2018  . Hypothyroidism 06/09/2018  . Depression 06/09/2018  . Fibromyalgia   . Lung nodule 05/08/2018  . Acute on chronic congestive heart failure ([place])   . Morbid obesity with BMI of 50.0-59.9, adult ([place]) 04/10/2018  . Chronic pain 04/07/2018  . Acute on chronic diastolic CHF (congestive heart failure) ([place]) 04/07/2018  . PNA (pneumonia) 03/21/2018  . COPD, group D, by GOLD 2017 classification ([place]) 02/25/2018  . Chronic pain syndrome   . Normocytic anemia 02/24/2018  . OSA (obstructive sleep apnea) 08/16/2017  . Morbid obesity due to excess calories ([place]) 04/29/2017  . Cor pulmonale (chronic) ([place]) 04/29/2017  . Chronic respiratory failure with hypoxia and hypercapnia ([place]) 04/28/2017  . CKD (chronic kidney disease), stage III ([place]) 04/14/2016  . Hyperlipidemia 01/16/2016  . Essential hypertension 01/16/2016  . Hypoxia   . Thrombocytopenia ([place]) 07/12/2015  . Thyroid activity decreased 06/23/2015  . Encounter for orogastric (OG) tube placement 09/02/2014  . Hernia of abdominal cavity 07/30/2014  . Abnormal CXR 02/15/2014  . COPD GOLDIII with min reversibility  12/16/2013  . History of hepatitis C 12/16/2013  . Breast cancer screening 12/16/2013  . Type II diabetes mellitus with renal manifestations ([place]) 12/16/2013  . Screening for osteoporosis 12/16/2013   PCP:  [name], PA-C Pharmacy:   [place], [place] - [address]  [place] [address] Phone: [phone] Fax: [phone]     Social Determinants of Health (SDOH) Interventions    Readmission Risk Interventions No flowsheet data found.

## 2018-09-13 NOTE — Consult Note (Signed)
   [facility] CM Inpatient Consult   09/13/2018  Renee Noble 09/22/1955 [id number]   Patient chart has been reviewed for high risk score, 45%, for unplanned readmissions.  Patient had been active with [facility] in the past for community case management services. Chart review reveals current disposition plan is for SNF.  No [facility] Care Management needs at this time. If disposition plans change, please make referral to [facility] CM.  [name], MSN, [facility] Liaison Nurse Mobile Phone [phone]  Toll free office [phone]

## 2018-09-13 NOTE — Progress Notes (Signed)
Hypoglycemic Event  CBG: 65  Treatment: Orange juice and breakfast tray   Symptoms: Asymptomatic   Follow-up CBG: Time: 0756>CBG 67 ,0850> CBG Renee Noble

## 2018-09-13 NOTE — Progress Notes (Signed)
Writer to floor for DBIV per consult. Per PICC RN tip not confirmed despite second xray. Awaiting MD plan. No labs drawn from line.Primary care RN updated.

## 2018-09-13 NOTE — Progress Notes (Signed)
Physical Therapy Treatment Patient Details Name: CAMBER NINH MRN: [id number] DOB: [birth year] Today's Date: 09/13/2018    History of Present Illness Pt admitted through ED 08/28/18 2* acute on chronic respiratory failure.  Pt requiring intubation and extubated 09/01/18. Intubated 09/06/18 due to evolving hypercapnic respiratory failure and extubated 09/08/18. Pt wtih hx of syncope, DM, Fibromyalgia, CHF, and nephrectomy    PT Comments    Assisted OOB to recliner via lift as pt was unable to attempt sit to stand.    Follow Up Recommendations  SNF     Equipment Recommendations  None recommended by PT    Recommendations for Other Services       Precautions / Restrictions Precautions Precautions: Fall Precaution Comments: home O2 (3.5 liters at rest, 4 with activity per pt) Restrictions Weight Bearing Restrictions: No    Mobility  Bed Mobility Overal bed mobility: Needs Assistance Bed Mobility: Supine to Sit     Supine to sit: Total assist;+2 for physical assistance;+2 for safety/equipment     General bed mobility comments: HOB elevated pt only able 15% self   Transfers Overall transfer level: Needs assistance Equipment used: Rolling walker (2 wheeled) Transfers: Sit to/from Stand Sit to Stand: From elevated surface;+2 physical assistance;+2 safety/equipment;Total assist         General transfer comment: attempted sit to stand however unable.  pt 5% of sit>stand from bed x2 with unable to get hips fully off of bed.  "I can't do it", "my legs are weak".  Used MaxiMove to transfer from seated EOB to recliner.   Ambulation/Gait                 Stairs             Wheelchair Mobility    Modified Rankin (Stroke Patients Only)       Balance                                            Cognition Arousal/Alertness: Awake/alert Behavior During Therapy: WFL for tasks assessed/performed Overall Cognitive Status: Within Functional Limits  for tasks assessed                                 General Comments: pt offers minimal effort      Exercises      General Comments        Pertinent Vitals/Pain Pain Assessment: Faces Faces Pain Scale: Hurts little more Pain Location: back and neck/chronic; Bil knees Pain Descriptors / Indicators: Constant;Discomfort Pain Intervention(s): Monitored during session    Home Living                      Prior Function            PT Goals (current goals can now be found in the care plan section) Progress towards PT goals: Progressing toward goals    Frequency    Min 3X/week      PT Plan Current plan remains appropriate    Co-evaluation              AM-PAC PT "6 Clicks" Mobility   Outcome Measure  Help needed turning from your back to your side while in a flat bed without using bedrails?: Total Help needed moving from lying on your back to sitting  on the side of a flat bed without using bedrails?: Total Help needed moving to and from a bed to a chair (including a wheelchair)?: Total Help needed standing up from a chair using your arms (e.g., wheelchair or bedside chair)?: Total Help needed to walk in hospital room?: Total Help needed climbing 3-5 steps with a railing? : Total 6 Click Score: 6    End of Session Equipment Utilized During Treatment: Oxygen;Gait belt Activity Tolerance: Patient limited by fatigue Patient left: in chair;with call bell/phone within reach Nurse Communication: Mobility status;Need for lift equipment PT Visit Diagnosis: Difficulty in walking, not elsewhere classified (R26.2)     Time: 1040-1105 PT Time Calculation (min) (ACUTE ONLY): 25 min  Charges:  $Therapeutic Activity: 23-37 mins                     Rica Koyanagi  PTA Acute  Rehabilitation Services Pager      [phone] Office      [phone]

## 2018-09-13 NOTE — Progress Notes (Addendum)
PICC tip verified as venous per ABG and is good to use for IV medications and to draw labs. Dr. Doristine Bosworth aware .

## 2018-09-13 NOTE — Care Management Important Message (Signed)
Important Message  Patient Details IM Letter given to [name] RN Case Manager to present to the Patient Name: Renee Noble MRN: [id number] Date of Birth: 07/30/1955   Medicare Important Message Given:  Yes    [name] 09/13/2018, 11:13 AMImportant Message  Patient Details  Name: Renee Noble MRN: [id number] Date of Birth: Dec 23, 1955   Medicare Important Message Given:  Yes    [name] 09/13/2018, 11:13 AM

## 2018-09-13 NOTE — Progress Notes (Signed)
PROGRESS NOTE    Renee Noble  ZOX:[id number] DOB: [birth year] DOA: 08/28/2018 PCP: [name], PA-C   Brief Narrative: Patient is a 63 year old female with history of chronic hypoxic respiratory failure on 3 L oxygen at home, severe COPD, chronic diastolic CHF, diabetes type 2, obesity who presents to [facility] with acute on chronic diastolic heart failure, COPD exacerbation.  Patient had 3 to 4 days of progressive shortness of breath and orthopnea.  Patient was transferred to [facility] for COVID rule out.  COVID-19 came out to be negative twice.  On 08/30/2015 she became hypercapnic with increased work of breathing necessitating intubation and mechanical ventilation.  Extubated to nasal cannula on 09/01/2018.  She continued to remain noncompliant to fluid restriction and CPAP.  She was being planned for discharge but had to be  intubated again on 09/06/2018 and was moved to ICU due to sudden onset of lethargy due to hypercapnea.  Extubated on 09/09/18.Physical therapy evaluated her and recommended skilled nursing facility on discharge.SW following. Has significant hypokalemia once again.  Planning for ensuring more compliance, will likely be ready to discharge in next 24 to 48 hours.  Assessment & Plan:   Principal Problem:   Acute on chronic respiratory failure with hypercapnia (HCC) Active Problems:   Type II diabetes mellitus with renal manifestations ([place])   Encounter for orogastric (OG) tube placement   Chronic pain syndrome   COPD, group D, by GOLD 2017 classification ([place])   Acute on chronic diastolic CHF (congestive heart failure) (HCC)   Acute on chronic congestive heart failure (HCC)   Hypothyroidism   Acute exacerbation of chronic obstructive pulmonary disease (COPD) ([place])   Atrial flutter with rapid ventricular response (HCC)   AKI (acute kidney injury) ([place])   Pressure ulcer   Endotracheal tube present   Palliative care encounter  Acute on chronic  respiratory failure with hypoxia and hypercapnia: Secondary to diastolic CHF, COPD, obesity hypoventilation syndrome, sleep apnea. She has been intubated twice during this hospitalization.  She is on 3 L oxygen via nasal cannula at home but currently on 4 L.  Continue bronchodilators as needed. ABG done 09/11/2018 showed hypercapnia with CO2 of 90, her baseline CO2 is around 80. ABG 09/12/2018 showed pCO2 of 77 and pH of 7.49. She is a chronic retainer and this CO2 is likely her baseline. My previous colleague had discussed with her in length about the necessity of using CPAP or BiPAP at night and I have discussed with her about the importance of using CPAP or BiPAP at night in length as well.  Suspicion for COVID-19: Ruled out.  Negative tests x2.  Acute on chronic diastolic heart failure: TTE on 11/19 showed ejection fraction of 65%.  Presented with 30 pounds of weight gain.  She is very noncompliant with fluid intake.  Constantly asked for increasing the limit of fluid intake.  She was started on Lasix drip here.  Currently on lasix 40 mg daily BID.  She was on torsemide 20 mg daily at home.Bilateral lower extremity edema have improved. We will continue to monitor input/output, daily weight.  Fluid restriction to less than 1.5 L a day. She should follow-up with cardiology as an outpatient.  COPD exacerbation: Gold 4.FEV1 30%.  Continue current bronchodilators.  She is on steroid at home.Prednisone tapered to 10 mg daily which she can continue on DC. Follow-up with pulmonology as an outpatient.  Obesity hypoventilation syndrome/sleep apnea:Very noncompliant nocturnal CPAP/bipap.  I have again stressed the  importance of using  BiPAP /Cpap at night.Continue Bipap at night while she is here.  Hypokalemia: Secondary to Lasix.  Significantly low today.  Will increase dosage from 40 mEq p.o. daily to twice daily.  Paroxysmal A. fib: Currently rate is controlled.  Anticoagulated with Eliquis.  On Cardizem  for rate control  AKI on CKD stage III: Baseline creatinine of 1.2.  Creatinine on baseline now.  Continue to monitor.  She has H/O  left nephrectomy.  Anemia/Thrombocytopenia: Most likely associated with chronic medical problems.  Currently stable.  Continue to monitor.No need for transfusion today.  Diabetes mellitus type 2: Continue Lantus and sliding scale insulin, blood sugar reasonably controlled.  Hypothyroidism: Continue Synthyroid  Pressure injury Left/right ischial tuberosities, present on admission  Debility/deconditioning: Patient evaluated by physical therapy and recommended skilled nursing facility on discharge.SW following.  Right upper lobe pulmonary nodule: Recommended CT chest in June/July 2020.  Multiple intubations/noncompliance/multiple comorbidities: Pallliative care was following.  Reconsulted again for discussing on goals of care and CODE STATUS. Remains full code despite multiple discussions.  Constipation : Continue miralax.   Nutrition Problem: Increased nutrient needs Etiology: acute illness      DVT prophylaxis: Eliquis Code Status: Full Family Communication: Discussed with cousin [name] on phone Disposition Plan: Skilled nursing facility after her respiratory status remains stable for 1-2 more days.   Consultants: PCCM  Procedures: Intubation x 2  Antimicrobials:  Anti-infectives (From admission, onward)   Start     Dose/Rate Route Frequency Ordered Stop   09/02/18 1100  amoxicillin-clavulanate (AUGMENTIN) 875-125 MG per tablet 1 tablet  Status:  Discontinued     1 tablet Oral Every 12 hours 09/02/18 1006 09/06/18 1401   08/30/18 2000  cefTRIAXone (ROCEPHIN) 1 g in sodium chloride 0.9 % 100 mL IVPB  Status:  Discontinued     1 g 200 mL/hr over 30 Minutes Intravenous Every 24 hours 08/30/18 1947 09/02/18 0919   08/30/18 2000  azithromycin (ZITHROMAX) 500 mg in sodium chloride 0.9 % 250 mL IVPB  Status:  Discontinued     500 mg 250 mL/hr  over 60 Minutes Intravenous Every 24 hours 08/30/18 1947 09/02/18 0919      Subjective: Patient seen and examined.  She has no complaints other than chronic pain.  Objective: Vitals:   09/13/18 0314 09/13/18 0459 09/13/18 0857 09/13/18 1010  BP:  (!) 136/53  121/60  Pulse:  85    Resp:  16    Temp:  97.6 F (36.4 C)    TempSrc:  Axillary    SpO2:  98% 96%   Weight: 128.1 kg     Height:        Intake/Output Summary (Last 24 hours) at 09/13/2018 1039 Last data filed at 09/13/2018 0600 Gross per 24 hour  Intake 530 ml  Output 700 ml  Net -170 ml   Filed Weights   09/11/18 0500 09/12/18 0500 09/13/18 0314  Weight: 124.4 kg 121.5 kg 128.1 kg    Examination:   General exam: Appears calm and comfortable ,Not in distress, morbidly obese  HEENT:PERRL,Oral mucosa moist, Ear/Nose normal on gross exam Respiratory system: Bilateral decreased air entry Cardiovascular system: Afib. No JVD, murmurs, rubs, gallops or clicks. Gastrointestinal system: Abdomen is obese, soft and nontender.  Ventral hernia on the left subcostal region, abdominal wall edema. Central nervous system: Alert and oriented. No focal neurological deficits. Extremities: 1 + bilateral lower extremity edema, no clubbing ,no cyanosis, distal peripheral pulses palpable. Skin: Chronic venous stasis  changes on bilateral lower extremities   Data Reviewed: I have personally reviewed following labs and imaging studies  CBC: Recent Labs  Lab 09/06/18 2330 09/08/18 0420 09/09/18 0301 09/10/18 0737 09/12/18 0500 09/13/18 0431  WBC 6.5 5.1 11.1* 10.0 6.0 10.4  NEUTROABS 6.1  --   --  8.7* 5.1 9.2*  HGB 8.0* 7.5* 8.7* 8.2* 7.4* 8.1*  HCT 28.6* 25.2* 29.8* 28.7* 27.3* 28.7*  MCV 85.6 81.6 82.8 85.2 86.1 85.4  PLT 60* 73* 87* 81* 66* 63*   Basic Metabolic Panel: Recent Labs  Lab 09/06/18 2330 09/07/18 0928 09/07/18 1800 09/08/18 0420 09/08/18 1700 09/09/18 0301 09/10/18 0737 09/11/18 0400 09/12/18 0500  09/13/18 0431  NA 134* 135  --  138  --  138 137 139 140 138  K 4.3 3.6  --  2.8*  --  3.0* 2.6* 2.9* 2.9* 3.1*  CL 85* 86*  --  85*  --  81* 76* 77* 77* 80*  CO2 37* 38*  --  42*  --  46* 50* >50* >50* 48*  GLUCOSE 132* 125*  --  246*  --  106* 109* 79 78 72  BUN 98* 121*  --  126*  --  113* 89* 71* 52* 48*  CREATININE 1.70* 1.73*  --  1.63*  --  1.39* 1.13* 0.99 0.90 0.94  CALCIUM 7.9* 7.8*  --  7.7*  --  8.3* 8.3* 8.6* 8.4* 8.3*  MG 1.8 1.7 1.8 1.9 1.7  --   --  1.8  --   --   PHOS 5.8* 4.2 3.6 3.3 3.0  --   --   --   --   --    GFR: Estimated Creatinine Clearance: 79.2 mL/min (by C-G formula based on SCr of 0.94 mg/dL). Liver Function Tests: Recent Labs  Lab 09/08/18 0420 09/09/18 0301  AST 14* 21  ALT 14 17  ALKPHOS 37* 55  BILITOT 0.8 0.8  PROT 5.0* 5.8*  ALBUMIN 3.0* 3.4*   No results for input(s): LIPASE, AMYLASE in the last 168 hours. No results for input(s): AMMONIA in the last 168 hours. Coagulation Profile: No results for input(s): INR, PROTIME in the last 168 hours. Cardiac Enzymes: No results for input(s): CKTOTAL, CKMB, CKMBINDEX, TROPONINI in the last 168 hours. BNP (last 3 results) No results for input(s): PROBNP in the last 8760 hours. HbA1C: No results for input(s): HGBA1C in the last 72 hours. CBG: Recent Labs  Lab 09/12/18 2316 09/13/18 0324 09/13/18 0726 09/13/18 0756 09/13/18 0850  GLUCAP 137* 79 65* 67* 192*   Lipid Profile: No results for input(s): CHOL, HDL, LDLCALC, TRIG, CHOLHDL, LDLDIRECT in the last 72 hours. Thyroid Function Tests: No results for input(s): TSH, T4TOTAL, FREET4, T3FREE, THYROIDAB in the last 72 hours. Anemia Panel: No results for input(s): VITAMINB12, FOLATE, FERRITIN, TIBC, IRON, RETICCTPCT in the last 72 hours. Sepsis Labs: No results for input(s): PROCALCITON, LATICACIDVEN in the last 168 hours.  No results found for this or any previous visit (from the past 240 hour(s)).       Radiology Studies: Dg Chest  Port 1 View  Result Date: 09/11/2018 CLINICAL DATA:  63 year old female with PICC placement EXAM: PORTABLE CHEST 1 VIEW COMPARISON:  09/11/2018 FINDINGS: Clavicles are midline on the current chest x-ray. The tip of the PICC terminates just to the left of midline. Left subclavian central venous catheter again terminates in the region of the brachiocephalic vein. Hazy opacities at the bilateral lung bases are unchanged with blunting of  the bilateral costophrenic angles and obscuration of the bilateral hemidiaphragm. No pneumothorax. IMPRESSION: The right upper extremity PICC again terminates with the tip terminating over the midline. The plain film is indeterminate for specifically locating the tip. If there is any further concern, correlation with blood gas or pressure transduction may be useful. Similar appearance of bilateral pleural effusions and associated atelectasis/consolidation Unchanged left subclavian central line Electronically Signed   By: Corrie Mckusick D.O.   On: 09/11/2018 20:42   Dg Chest Port 1 View  Result Date: 09/11/2018 CLINICAL DATA:  63 year old female with central line placement EXAM: PORTABLE CHEST 1 VIEW COMPARISON:  09/09/2018, 09/08/2018 FINDINGS: Cardiomediastinal silhouette unchanged in size and contour with cardiomegaly. Interval placement of right upper extremity PICC, with the tip terminating on the left aspect of the spine. There is left rotation the patient. Unchanged position of left subclavian central venous catheter with the tip appearing to terminate in the brachiocephalic vein. Hazy opacities of the bilateral lung bases with obscuration of the retrocardiac region of the bilateral cardiophrenic angles. No new airspace opacity. IMPRESSION: Interval placement of right upper extremity PICC. The right rotation of the chest x-ray limits evaluation of the tip of the catheter, which terminates over the left aspect of the spine. While this most likely is projectional, arterial  placement cannot be excluded on this plain film. If there is any concern for the location, correlation with blood gas may be useful, or repeat plain film. Unchanged left subclavian central venous catheter. Similar appearance of pleural effusions with associated atelectasis/consolidation. Electronically Signed   By: Corrie Mckusick D.O.   On: 09/11/2018 19:25   [place] Ekg Site Rite  Result Date: 09/11/2018 If Site Rite image not attached, placement could not be confirmed due to current cardiac rhythm.       Scheduled Meds:  apixaban  5 mg Oral BID   arformoterol  15 mcg Nebulization BID   budesonide (PULMICORT) nebulizer solution  0.5 mg Nebulization BID   Chlorhexidine Gluconate Cloth  6 each Topical Daily   diltiazem  180 mg Oral Daily   feeding supplement  1 Container Oral BID BM   feeding supplement (ENSURE ENLIVE)  237 mL Oral BID BM   feeding supplement (PRO-STAT SUGAR FREE 64)  30 mL Oral Daily   furosemide  40 mg Oral BID   insulin aspart  0-15 Units Subcutaneous Q4H   insulin detemir  10 Units Subcutaneous BID   ipratropium-albuterol  3 mL Nebulization TID   levothyroxine  88 mcg Oral Q0600   mouth rinse  15 mL Mouth Rinse BID   multivitamin with minerals  1 tablet Oral Daily   nutrition supplement (JUVEN)  1 packet Oral BID BM   nystatin   Topical TID   pantoprazole  40 mg Oral Daily   polyethylene glycol  17 g Oral Daily   potassium chloride  40 mEq Oral Once   potassium chloride  40 mEq Oral BID   predniSONE  10 mg Oral Q breakfast   sodium chloride flush  10-40 mL Intracatheter Q12H   Continuous Infusions:    LOS: 16 days    Time spent: 32 mins.More than 50% of that time was spent in counseling and/or coordination of care.      Darliss Cheney, MD [facility] Pager [phone]  If 7PM-7AM, please contact night-coverage [email or web address] Password [email or web address] 09/13/2018, 10:39 AM

## 2018-09-13 NOTE — Progress Notes (Signed)
Patient placed back on Bipap at this time. Will continue to monitor. Renee Noble I

## 2018-09-13 NOTE — Consult Note (Signed)
Spoke with RN, she asked what the results should be for the ABG on the PICC placement, advised to call MD with the results to let him determine

## 2018-09-13 NOTE — Progress Notes (Signed)
Right upper arm PICC with question of tip position. Dr. Doristine Bosworth notified, order received for ABG to verify that PICC tip is in SVC. Will continue to monitor.

## 2018-09-13 NOTE — Progress Notes (Signed)
Patient requested Bipap be removed at 0300. Patient placed back on 4LNC. Stated she would put Bipap back on after breakfast. This RN educated patient that if sleeping, she will be placed back on Bipap. Patient verbalized agreement and understanding. Carnella [place] I

## 2018-09-13 NOTE — Progress Notes (Signed)
CRITICAL VALUE ALERT  Critical Value:  ABG pCO2: 83.1  Date & Time Notied:  09/13/2018; 2909  Provider Notified: Lamar Blinks, NP  Orders Received/Actions taken: awaiting orders  Renee Noble I

## 2018-09-13 NOTE — Progress Notes (Signed)
Please disregard previous note charted under Louellen Molder, RN. Note is incorrect and was charted by another nurse. Will place correct note.

## 2018-09-14 DIAGNOSIS — Z7189 Other specified counseling: Secondary | ICD-10-CM

## 2018-09-14 LAB — IRON AND TIBC
Iron: 19 ug/dL — ABNORMAL LOW (ref 28–170)
Saturation Ratios: 7 % — ABNORMAL LOW (ref 10.4–31.8)
TIBC: 281 ug/dL (ref 250–450)
UIBC: 262 ug/dL

## 2018-09-14 LAB — CBC WITH DIFFERENTIAL/PLATELET
Abs Immature Granulocytes: 0.03 10*3/uL (ref 0.00–0.07)
Abs Immature Granulocytes: 0.02 10*3/uL (ref 0.00–0.07)
Basophils Absolute: 0 10*3/uL (ref 0.0–0.1)
Basophils Absolute: 0 10*3/uL (ref 0.0–0.1)
Basophils Relative: 0 %
Basophils Relative: 0 %
Eosinophils Absolute: 0 10*3/uL (ref 0.0–0.5)
Eosinophils Absolute: 0.2 10*3/uL (ref 0.0–0.5)
Eosinophils Relative: 1 %
Eosinophils Relative: 3 %
HCT: 25.9 % — ABNORMAL LOW (ref 36.0–46.0)
HCT: 25.2 % — ABNORMAL LOW (ref 36.0–46.0)
Hemoglobin: 7.1 g/dL — ABNORMAL LOW (ref 12.0–15.0)
Hemoglobin: 7.1 g/dL — ABNORMAL LOW (ref 12.0–15.0)
Immature Granulocytes: 1 %
Immature Granulocytes: 0 %
Lymphocytes Relative: 10 %
Lymphocytes Relative: 4 %
Lymphs Abs: 0.6 10*3/uL — ABNORMAL LOW (ref 0.7–4.0)
Lymphs Abs: 0.3 10*3/uL — ABNORMAL LOW (ref 0.7–4.0)
MCH: 23.7 pg — ABNORMAL LOW (ref 26.0–34.0)
MCH: 24.7 pg — ABNORMAL LOW (ref 26.0–34.0)
MCHC: 27.4 g/dL — ABNORMAL LOW (ref 30.0–36.0)
MCHC: 28.2 g/dL — ABNORMAL LOW (ref 30.0–36.0)
MCV: 86.6 fL (ref 80.0–100.0)
MCV: 87.5 fL (ref 80.0–100.0)
Monocytes Absolute: 0.4 10*3/uL (ref 0.1–1.0)
Monocytes Absolute: 0.2 10*3/uL (ref 0.1–1.0)
Monocytes Relative: 6 %
Monocytes Relative: 4 %
Neutro Abs: 5.2 10*3/uL (ref 1.7–7.7)
Neutro Abs: 6.2 10*3/uL (ref 1.7–7.7)
Neutrophils Relative %: 82 %
Neutrophils Relative %: 89 %
Platelets: 52 10*3/uL — ABNORMAL LOW (ref 150–400)
Platelets: 51 10*3/uL — ABNORMAL LOW (ref 150–400)
RBC: 2.99 MIL/uL — ABNORMAL LOW (ref 3.87–5.11)
RBC: 2.88 MIL/uL — ABNORMAL LOW (ref 3.87–5.11)
RDW: 18.9 % — ABNORMAL HIGH (ref 11.5–15.5)
RDW: 19 % — ABNORMAL HIGH (ref 11.5–15.5)
WBC: 6.3 10*3/uL (ref 4.0–10.5)
WBC: 6.9 10*3/uL (ref 4.0–10.5)
nRBC: 0 % (ref 0.0–0.2)
nRBC: 0 % (ref 0.0–0.2)

## 2018-09-14 LAB — RETICULOCYTES
Immature Retic Fract: 15.7 % (ref 2.3–15.9)
RBC.: 2.95 MIL/uL — ABNORMAL LOW (ref 3.87–5.11)
Retic Count, Absolute: 62 10*3/uL (ref 19.0–186.0)
Retic Ct Pct: 2.1 % (ref 0.4–3.1)

## 2018-09-14 LAB — COMPREHENSIVE METABOLIC PANEL
ALT: 14 U/L (ref 0–44)
AST: 20 U/L (ref 15–41)
Albumin: 2.9 g/dL — ABNORMAL LOW (ref 3.5–5.0)
Alkaline Phosphatase: 57 U/L (ref 38–126)
Anion gap: 10 (ref 5–15)
BUN: 46 mg/dL — ABNORMAL HIGH (ref 8–23)
CO2: 49 mmol/L — ABNORMAL HIGH (ref 22–32)
Calcium: 8.4 mg/dL — ABNORMAL LOW (ref 8.9–10.3)
Chloride: 81 mmol/L — ABNORMAL LOW (ref 98–111)
Creatinine, Ser: 1.07 mg/dL — ABNORMAL HIGH (ref 0.44–1.00)
GFR calc Af Amer: >60 mL/min (ref >60)
GFR calc non Af Amer: 54 mL/min — ABNORMAL LOW (ref >60)
Glucose, Bld: 120 mg/dL — ABNORMAL HIGH (ref 70–99)
Potassium: 3.4 mmol/L — ABNORMAL LOW (ref 3.5–5.1)
Sodium: 140 mmol/L (ref 135–145)
Total Bilirubin: 0.5 mg/dL (ref 0.3–1.2)
Total Protein: 4.9 g/dL — ABNORMAL LOW (ref 6.5–8.1)

## 2018-09-14 LAB — GLUCOSE, CAPILLARY
Glucose-Capillary: 118 mg/dL — ABNORMAL HIGH (ref 70–99)
Glucose-Capillary: 128 mg/dL — ABNORMAL HIGH (ref 70–99)
Glucose-Capillary: 130 mg/dL — ABNORMAL HIGH (ref 70–99)
Glucose-Capillary: 163 mg/dL — ABNORMAL HIGH (ref 70–99)
Glucose-Capillary: 186 mg/dL — ABNORMAL HIGH (ref 70–99)
Glucose-Capillary: 196 mg/dL — ABNORMAL HIGH (ref 70–99)
Glucose-Capillary: 221 mg/dL — ABNORMAL HIGH (ref 70–99)

## 2018-09-14 LAB — FERRITIN: Ferritin: 35 ng/mL (ref 11–307)

## 2018-09-14 LAB — TRANSFERRIN: Transferrin: 201 mg/dL (ref 192–382)

## 2018-09-14 NOTE — TOC Progression Note (Signed)
Transition of Care [facility]) - Progression Note    Patient Details  Name: Renee Noble MRN: [id number] Date of Birth: March 05, 1956  Transition of Care [facility]) CM/SW [place], [place] Phone Number: [phone]  09/14/2018, 1:30 PM  Clinical Narrative:   Spoke with pt again today about SNF choice. She and cousin Renee Noble are working to select. Understand insurance authorization will need to be initiated once facility selected. CSW stated will check back with pt this afternoon for progress on SNF choice.     Expected Discharge Plan: Skilled Nursing Facility Barriers to Discharge: Other (comment)(Await patient choice for SNF.)  Expected Discharge Plan and Services Expected Discharge Plan: [place]   Discharge Planning Services: CM Consult           Social Determinants of Health (SDOH) Interventions    Readmission Risk Interventions No flowsheet data found.

## 2018-09-14 NOTE — Progress Notes (Addendum)
PROGRESS NOTE    Renee Noble  NUU:[id number] DOB: [birth year] DOA: 08/28/2018 PCP: [name], PA-C   Brief Narrative: Patient is a 63 year old female with history of chronic hypoxic respiratory failure on 3 L oxygen at home, severe COPD, chronic diastolic CHF, diabetes type 2, obesity who presents to [facility] with acute on chronic diastolic heart failure, COPD exacerbation.  Patient had 3 to 4 days of progressive shortness of breath and orthopnea.  Patient was transferred to [facility] for COVID rule out.  COVID-19 came out to be negative twice.  On 08/30/2015 she became hypercapnic with increased work of breathing necessitating intubation and mechanical ventilation.  Extubated to nasal cannula on 09/01/2018.  She continued to remain noncompliant to fluid restriction and CPAP.  She was being planned for discharge but had to be  intubated again on 09/06/2018 and was moved to ICU due to sudden onset of lethargy due to hypercapnea.  Extubated on 09/09/18.Physical therapy evaluated her and recommended skilled nursing facility on discharge.SW following. Has mild hypokalemia today.  Planning for ensuring more compliance, patient is still needs to decide about the choice of a skilled nursing facility.  Assessment & Plan:   Principal Problem:   Acute on chronic respiratory failure with hypercapnia (HCC) Active Problems:   Type II diabetes mellitus with renal manifestations ([place])   Encounter for orogastric (OG) tube placement   Chronic pain syndrome   COPD, group D, by GOLD 2017 classification ([place])   Acute on chronic diastolic CHF (congestive heart failure) (HCC)   Acute on chronic congestive heart failure (HCC)   Hypothyroidism   Acute exacerbation of chronic obstructive pulmonary disease (COPD) ([place])   Atrial flutter with rapid ventricular response (HCC)   AKI (acute kidney injury) ([place])   Pressure ulcer   Endotracheal tube present   Palliative care encounter  Acute on chronic  respiratory failure with hypoxia and hypercapnia: Secondary to diastolic CHF, COPD, obesity hypoventilation syndrome, sleep apnea. She has been intubated twice during this hospitalization.  She is on 3 L oxygen via nasal cannula at home but currently on 4 L.  Continue bronchodilators as needed. ABG done 09/11/2018 showed hypercapnia with CO2 of 90, her baseline CO2 is around 80. ABG 09/12/2018 showed pCO2 of 77 and pH of 7.49. She is a chronic retainer and this CO2 is likely her baseline. My previous colleague had discussed with her in length about the necessity of using CPAP or BiPAP at night and I have discussed with her about the importance of using CPAP or BiPAP at night in length as well.  Suspicion for COVID-19: Ruled out.  Negative tests x2.  Acute on chronic diastolic heart failure: TTE on 11/19 showed ejection fraction of 65%.  Presented with 30 pounds of weight gain.  She is very noncompliant with fluid intake.  Constantly asked for increasing the limit of fluid intake.  She was started on Lasix drip here.  Currently on lasix 40 mg daily BID.  She was on torsemide 20 mg daily at home.Bilateral lower extremity edema have improved. We will continue to monitor input/output, daily weight.  Fluid restriction to less than 1.5 L a day. She should follow-up with cardiology as an outpatient.  COPD exacerbation: Gold 4.FEV1 30%.  Continue current bronchodilators.  She is on steroid at home.Prednisone tapered to 10 mg daily which she can continue on DC. Follow-up with pulmonology as an outpatient.  Obesity hypoventilation syndrome/sleep apnea:Very noncompliant nocturnal CPAP/bipap.  I have again stressed  the importance of using  BiPAP /Cpap at night.Continue Bipap at night while she is here.  Hypokalemia: Secondary to Lasix.  Only 3.4 today.  We will continue potassium chloride 40 mEq twice daily.   Paroxysmal A. fib: Currently rate is controlled.  Anticoagulated with Eliquis.  On oral Cardizem for rate  control  AKI on CKD stage III: Baseline creatinine of 1.2.  Creatinine on baseline now.  Continue to monitor.  She has H/O  left nephrectomy.  Anemia/Thrombocytopenia: Most likely associated with chronic medical problems.  Currently stable.  Continue to monitor.No need for transfusion today.  Diabetes mellitus type 2: Continue Lantus and sliding scale insulin, blood sugar reasonably controlled.  Hypothyroidism: Continue Synthyroid  Pressure injury Left/right ischial tuberosities, present on admission  Debility/deconditioning: Patient evaluated by physical therapy and recommended skilled nursing facility on discharge.SW following.  Right upper lobe pulmonary nodule: Recommended CT chest in June/July 2020.  Multiple intubations/noncompliance/multiple comorbidities: Pallliative care was following.  Reconsulted again for discussing on goals of care and CODE STATUS. Remains full code despite multiple discussions.  Constipation : Continue miralax.  Anemia of chronic disease: Her baseline hemoglobin is around 8.  Currently 7.1.  I will check iron studies and fecal occult blood test.  Continue to monitor and transfuse if below 7.   Nutrition Problem: Increased nutrient needs Etiology: acute illness      DVT prophylaxis: Eliquis Code Status: Full Family Communication: Discussed with patient about plan of care Disposition Plan: Skilled nursing facility.  According to patient, she can only go to 5 skilled nursing facilities and her cousin has visited 2 of them yesterday and is going to visit tomorrow today and patient will likely make a decision about the choice of a skilled nursing facility either later today or tomorrow.   Consultants: PCCM  Procedures: Intubation x 2  Antimicrobials:  Anti-infectives (From admission, onward)   Start     Dose/Rate Route Frequency Ordered Stop   09/02/18 1100  amoxicillin-clavulanate (AUGMENTIN) 875-125 MG per tablet 1 tablet  Status:  Discontinued      1 tablet Oral Every 12 hours 09/02/18 1006 09/06/18 1401   08/30/18 2000  cefTRIAXone (ROCEPHIN) 1 g in sodium chloride 0.9 % 100 mL IVPB  Status:  Discontinued     1 g 200 mL/hr over 30 Minutes Intravenous Every 24 hours 08/30/18 1947 09/02/18 0919   08/30/18 2000  azithromycin (ZITHROMAX) 500 mg in sodium chloride 0.9 % 250 mL IVPB  Status:  Discontinued     500 mg 250 mL/hr over 60 Minutes Intravenous Every 24 hours 08/30/18 1947 09/02/18 0919      Subjective: Patient seen and examined.  No new complaints.  Objective: Vitals:   09/13/18 1944 09/13/18 2016 09/14/18 0411 09/14/18 1025  BP:  (!) 120/53 118/61 120/64  Pulse:  86 76 97  Resp:  13 10 12   Temp:  98.3 F (36.8 C) 98.7 F (37.1 C)   TempSrc:  Oral Oral   SpO2: 97% 98% 98%   Weight:   126.8 kg   Height:        Intake/Output Summary (Last 24 hours) at 09/14/2018 1202 Last data filed at 09/14/2018 1142 Gross per 24 hour  Intake 720 ml  Output 2350 ml  Net -1630 ml   Filed Weights   09/12/18 0500 09/13/18 0314 09/14/18 0411  Weight: 121.5 kg 128.1 kg 126.8 kg    Examination:   General exam: Appears calm and comfortable ,Not in distress, morbidly obese  HEENT:PERRL,Oral mucosa moist, Ear/Nose normal on gross exam Respiratory system: Bilateral decreased air entry Cardiovascular system: Afib. No JVD, murmurs, rubs, gallops or clicks. Gastrointestinal system: Abdomen is obese, soft and nontender.  Ventral hernia on the left subcostal region, abdominal wall edema. Central nervous system: Alert and oriented. No focal neurological deficits. Extremities: 1 + bilateral lower extremity edema, no clubbing ,no cyanosis, distal peripheral pulses palpable. Skin: Chronic venous stasis changes on bilateral lower extremities   Data Reviewed: I have personally reviewed following labs and imaging studies  CBC: Recent Labs  Lab 09/09/18 0301 09/10/18 0737 09/12/18 0500 09/13/18 0431 09/14/18 0345  WBC 11.1* 10.0 6.0  10.4 6.3  NEUTROABS  --  8.7* 5.1 9.2* 5.2  HGB 8.7* 8.2* 7.4* 8.1* 7.1*  HCT 29.8* 28.7* 27.3* 28.7* 25.9*  MCV 82.8 85.2 86.1 85.4 86.6  PLT 87* 81* 66* 63* 52*   Basic Metabolic Panel: Recent Labs  Lab 09/07/18 1800 09/08/18 0420 09/08/18 1700  09/10/18 0737 09/11/18 0400 09/12/18 0500 09/13/18 0431 09/14/18 0345  NA  --  138  --    < > 137 139 140 138 140  K  --  2.8*  --    < > 2.6* 2.9* 2.9* 3.1* 3.4*  CL  --  85*  --    < > 76* 77* 77* 80* 81*  CO2  --  42*  --    < > 50* >50* >50* 48* 49*  GLUCOSE  --  246*  --    < > 109* 79 78 72 120*  BUN  --  126*  --    < > 89* 71* 52* 48* 46*  CREATININE  --  1.63*  --    < > 1.13* 0.99 0.90 0.94 1.07*  CALCIUM  --  7.7*  --    < > 8.3* 8.6* 8.4* 8.3* 8.4*  MG 1.8 1.9 1.7  --   --  1.8  --   --   --   PHOS 3.6 3.3 3.0  --   --   --   --   --   --    < > = values in this interval not displayed.   GFR: Estimated Creatinine Clearance: 69.1 mL/min (A) (by C-G formula based on SCr of 1.07 mg/dL (H)). Liver Function Tests: Recent Labs  Lab 09/08/18 0420 09/09/18 0301 09/14/18 0345  AST 14* 21 20  ALT 14 17 14   ALKPHOS 37* 55 57  BILITOT 0.8 0.8 0.5  PROT 5.0* 5.8* 4.9*  ALBUMIN 3.0* 3.4* 2.9*   No results for input(s): LIPASE, AMYLASE in the last 168 hours. No results for input(s): AMMONIA in the last 168 hours. Coagulation Profile: No results for input(s): INR, PROTIME in the last 168 hours. Cardiac Enzymes: No results for input(s): CKTOTAL, CKMB, CKMBINDEX, TROPONINI in the last 168 hours. BNP (last 3 results) No results for input(s): PROBNP in the last 8760 hours. HbA1C: No results for input(s): HGBA1C in the last 72 hours. CBG: Recent Labs  Lab 09/13/18 2011 09/13/18 2357 09/14/18 0408 09/14/18 0801 09/14/18 1139  GLUCAP 162* 166* 118* 128* 221*   Lipid Profile: No results for input(s): CHOL, HDL, LDLCALC, TRIG, CHOLHDL, LDLDIRECT in the last 72 hours. Thyroid Function Tests: No results for input(s):  TSH, T4TOTAL, FREET4, T3FREE, THYROIDAB in the last 72 hours. Anemia Panel: Recent Labs    09/14/18 1014  FERRITIN 35  TIBC 281  IRON 19*  RETICCTPCT 2.1   Sepsis Labs: No results  for input(s): PROCALCITON, LATICACIDVEN in the last 168 hours.  No results found for this or any previous visit (from the past 240 hour(s)).       Radiology Studies: No results found.      Scheduled Meds: . apixaban  5 mg Oral BID  . arformoterol  15 mcg Nebulization BID  . budesonide (PULMICORT) nebulizer solution  0.5 mg Nebulization BID  . Chlorhexidine Gluconate Cloth  6 each Topical Daily  . diltiazem  180 mg Oral Daily  . feeding supplement  1 Container Oral BID BM  . feeding supplement (ENSURE ENLIVE)  237 mL Oral BID BM  . feeding supplement (PRO-STAT SUGAR FREE 64)  30 mL Oral Daily  . furosemide  40 mg Oral BID  . insulin aspart  0-15 Units Subcutaneous Q4H  . insulin detemir  10 Units Subcutaneous BID  . ipratropium-albuterol  3 mL Nebulization TID  . levothyroxine  88 mcg Oral Q0600  . mouth rinse  15 mL Mouth Rinse BID  . multivitamin with minerals  1 tablet Oral Daily  . nutrition supplement (JUVEN)  1 packet Oral BID BM  . nystatin   Topical TID  . pantoprazole  40 mg Oral Daily  . polyethylene glycol  17 g Oral Daily  . potassium chloride  40 mEq Oral Once  . potassium chloride  40 mEq Oral BID  . predniSONE  10 mg Oral Q breakfast  . sodium chloride flush  10-40 mL Intracatheter Q12H   Continuous Infusions: None    LOS: 17 days    Time spent: 28 mins.More than 50% of that time was spent in counseling and/or coordination of care.      Darliss Cheney, MD [facility] Pager [phone]  If 7PM-7AM, please contact night-coverage [email or web address] Password [email or web address] 09/14/2018, 12:02 PM

## 2018-09-14 NOTE — Progress Notes (Signed)
Palliative:  HPI: 63 yo female PMH significant for renal cell carcinoma (s/p nephrectomy), COPD 3L at home, OSA (noncompliant with CPAP), diastolic CHF, diabetes, chronic pain with chronic opioids admitted 08/28/18 with volume overload and COPD exacerbation and new atrial fibrillation. Over course of hospitalization she has been intubated and then extubated on 2 occasions. She was initially noncompliant with CPAP and now requires BiPAP. Respiratory status more stable with improved compliance with BiPAP and anticipating d/c to SNF rehab soon.   I met today with Renee Noble. She is alert and oriented. She tells me that she is ready to get out of the hospital and a change of scenery. Her cousin, [name] (more like a sister) is helping her to choose a SNF. She is frustrated that she is so weak and hates the feeling that she has to rely on others for everything. We discuss her goals and she tells me that she knows she has to use her BiPAP "if I don't want to die." She tells me that the BiPAP is getting a little easier to use if she takes her pain medication when beginning BiPAP it helps her sleep through the night with BiPAP.   She tells me about her home in [place] where she lives with [name] and [name]'s husband (who she says is also hospitalized). She misses her chipmunk and birds and the peace and quiet of home and is hopeful to return after rehab. She talks of being motivated to wear BiPAP and to work with therapy in order to eventually return home. I was clear that this will take time and a lot of work and effort on her part. All questions/concerns addressed. Emotional support provided.   Exam: Alert, oriented. Obese. No distress.   Plan: - Goals continue for full code. - Says she will wear BiPAP as instructed.  - Hopeful for SNF rehab soon.   25 min  Vinie Sill, NP Palliative Medicine Team Pager # [phone] (M-F 8a-5p) Team Phone # [phone] (Nights/Weekends)

## 2018-09-15 LAB — COMPREHENSIVE METABOLIC PANEL
ALT: 16 U/L (ref 0–44)
AST: 20 U/L (ref 15–41)
Albumin: 3.1 g/dL — ABNORMAL LOW (ref 3.5–5.0)
Alkaline Phosphatase: 68 U/L (ref 38–126)
Anion gap: 9 (ref 5–15)
BUN: 46 mg/dL — ABNORMAL HIGH (ref 8–23)
CO2: 46 mmol/L — ABNORMAL HIGH (ref 22–32)
Calcium: 8.3 mg/dL — ABNORMAL LOW (ref 8.9–10.3)
Chloride: 82 mmol/L — ABNORMAL LOW (ref 98–111)
Creatinine, Ser: 1.15 mg/dL — ABNORMAL HIGH (ref 0.44–1.00)
GFR calc Af Amer: 58 mL/min — ABNORMAL LOW (ref >60)
GFR calc non Af Amer: 50 mL/min — ABNORMAL LOW (ref >60)
Glucose, Bld: 151 mg/dL — ABNORMAL HIGH (ref 70–99)
Potassium: 4 mmol/L (ref 3.5–5.1)
Sodium: 137 mmol/L (ref 135–145)
Total Bilirubin: 0.8 mg/dL (ref 0.3–1.2)
Total Protein: 5.3 g/dL — ABNORMAL LOW (ref 6.5–8.1)

## 2018-09-15 LAB — CBC WITH DIFFERENTIAL/PLATELET
Abs Immature Granulocytes: 0.04 10*3/uL (ref 0.00–0.07)
Basophils Absolute: 0 10*3/uL (ref 0.0–0.1)
Basophils Relative: 0 %
Eosinophils Absolute: 0 10*3/uL (ref 0.0–0.5)
Eosinophils Relative: 0 %
HCT: 25.8 % — ABNORMAL LOW (ref 36.0–46.0)
Hemoglobin: 6.9 g/dL — CL (ref 12.0–15.0)
Immature Granulocytes: 1 %
Lymphocytes Relative: 9 %
Lymphs Abs: 0.5 10*3/uL — ABNORMAL LOW (ref 0.7–4.0)
MCH: 23.3 pg — ABNORMAL LOW (ref 26.0–34.0)
MCHC: 26.7 g/dL — ABNORMAL LOW (ref 30.0–36.0)
MCV: 87.2 fL (ref 80.0–100.0)
Monocytes Absolute: 0.3 10*3/uL (ref 0.1–1.0)
Monocytes Relative: 6 %
Neutro Abs: 4.3 10*3/uL (ref 1.7–7.7)
Neutrophils Relative %: 84 %
Platelets: 43 10*3/uL — ABNORMAL LOW (ref 150–400)
RBC: 2.96 MIL/uL — ABNORMAL LOW (ref 3.87–5.11)
RDW: 18.8 % — ABNORMAL HIGH (ref 11.5–15.5)
WBC: 5.1 10*3/uL (ref 4.0–10.5)
nRBC: 0 % (ref 0.0–0.2)

## 2018-09-15 LAB — CBC
HCT: 28.2 % — ABNORMAL LOW (ref 36.0–46.0)
Hemoglobin: 8.1 g/dL — ABNORMAL LOW (ref 12.0–15.0)
MCH: 25.3 pg — ABNORMAL LOW (ref 26.0–34.0)
MCHC: 28.7 g/dL — ABNORMAL LOW (ref 30.0–36.0)
MCV: 88.1 fL (ref 80.0–100.0)
Platelets: 43 10*3/uL — ABNORMAL LOW (ref 150–400)
RBC: 3.2 MIL/uL — ABNORMAL LOW (ref 3.87–5.11)
RDW: 18.6 % — ABNORMAL HIGH (ref 11.5–15.5)
WBC: 6.5 10*3/uL (ref 4.0–10.5)
nRBC: 0 % (ref 0.0–0.2)

## 2018-09-15 LAB — GLUCOSE, CAPILLARY
Glucose-Capillary: 151 mg/dL — ABNORMAL HIGH (ref 70–99)
Glucose-Capillary: 123 mg/dL — ABNORMAL HIGH (ref 70–99)
Glucose-Capillary: 208 mg/dL — ABNORMAL HIGH (ref 70–99)
Glucose-Capillary: 185 mg/dL — ABNORMAL HIGH (ref 70–99)
Glucose-Capillary: 173 mg/dL — ABNORMAL HIGH (ref 70–99)

## 2018-09-15 LAB — PREPARE RBC (CROSSMATCH)

## 2018-09-15 MED ORDER — SODIUM CHLORIDE 0.9% IV SOLUTION: Freq: Once | INTRAVENOUS | Status: DC

## 2018-09-15 NOTE — Discharge Instructions (Signed)
Patient to bring her own CPAP to SNF.

## 2018-09-15 NOTE — TOC Progression Note (Signed)
Transition of Care [facility]) - Progression Note    Patient Details  Name: Renee Noble MRN: [id number] Date of Birth: [birth year]  [facility] CM/SW Contact  Bluma Buresh, Juliann Pulse, RN Phone Number: 09/15/2018, 2:56 PM  Clinical Narrative: patient chose [facility] SNF rep [facility] aware-facility awaiting a more recent COVID test-MD aware to order. Patient has neg x2 COVID 4/14, & 08/30/18.      Expected Discharge Plan: Skilled Nursing Facility Barriers to Discharge: Other (comment)(Await patient choice for SNF.)  Expected Discharge Plan and Services Expected Discharge Plan: [place]   Discharge Planning Services: CM Consult                                           Social Determinants of Health (SDOH) Interventions    Readmission Risk Interventions No flowsheet data found.

## 2018-09-15 NOTE — TOC Progression Note (Signed)
Transition of Care [facility]) - Progression Note    Patient Details  Name: Renee Noble MRN: [id number] Date of Birth: 12-Jul-1955  [facility] CM/SW Contact  Aquilla Shambley, Juliann Pulse, RN Phone Number: 09/15/2018, 3:08 PM  Clinical Narrative:  D/c plan-SNF-[facility] waiting on a more recent COVID-attending is aware to order.     Expected Discharge Plan: Skilled Nursing Facility Barriers to Discharge: Other (comment)(Await patient choice for SNF.)  Expected Discharge Plan and Services Expected Discharge Plan: [place]   Discharge Planning Services: CM Consult                                           Social Determinants of Health (SDOH) Interventions    Readmission Risk Interventions No flowsheet data found.

## 2018-09-15 NOTE — Progress Notes (Signed)
Occupational Therapy Treatment Patient Details Name: Renee Noble MRN: [id number] DOB: 1956-02-24 Today's Date: 09/15/2018    History of present illness Pt admitted through ED 08/28/18 2* acute on chronic respiratory failure.  Pt requiring intubation and extubated 09/01/18. Intubated 09/06/18 due to evolving hypercapnic respiratory failure and extubated 09/08/18. Pt wtih hx of syncope, DM, Fibromyalgia, CHF, and nephrectomy   OT comments  Poor activity tolerance today, but pt willing to participate. Sat eob x 1 minute.  Total +2 assist for bed mobility and up to mod A to sit EOB.  Pt declined UE exercises and will do what she can from bed level this weekend.   Follow Up Recommendations  SNF    Equipment Recommendations  (may need lift)    Recommendations for Other Services      Precautions / Restrictions Precautions Precautions: Fall Precaution Comments: home O2 (3.5 liters at rest, 4 with activity per pt) Restrictions Weight Bearing Restrictions: No       Mobility Bed Mobility         Supine to sit: Total assist;+2 for physical assistance;+2 for safety/equipment;HOB elevated   Sit to sidelying: Total assist;+2 for physical assistance General bed mobility comments: pt 0%  Transfers                      Balance     Sitting balance-Leahy Scale: Poor Sitting balance - Comments: mod A for sitting balance                                   ADL either performed or assessed with clinical judgement   ADL                                         General ADL Comments: pt agreeable to trying to sit EOB; s/p one unit PRBCs     Vision       Perception     Praxis      Cognition Arousal/Alertness: Awake/alert Behavior During Therapy: WFL for tasks assessed/performed Overall Cognitive Status: Within Functional Limits for tasks assessed                                          Exercises Exercises: (declined UE  exer ex)   Shoulder Instructions       General Comments only tolerated sitting EOB about 1 minute.  Pt felt that she might pass out. BP 151/78 once repositioned in bed.    Pertinent Vitals/ Pain       Pain Assessment: Faces Faces Pain Scale: Hurts even more Pain Location: back and neck/chronic; Bil knees Pain Descriptors / Indicators: Constant;Discomfort Pain Intervention(s): Limited activity within patient's tolerance;Monitored during session;Repositioned  Home Living                                          Prior Functioning/Environment              Frequency  Min 2X/week        Progress Toward Goals  OT Goals(current goals can now be found in the care plan section)  Progress  towards OT goals: Not progressing toward goals - comment(poor activity tolerance today)     Plan      Co-evaluation    PT/OT/SLP Co-Evaluation/Treatment: Yes Reason for Co-Treatment: For patient/therapist safety PT goals addressed during session: Mobility/safety with mobility OT goals addressed during session: Strengthening/ROM      AM-PAC OT "6 Clicks" Daily Activity     Outcome Measure   Help from another person eating meals?: None Help from another person taking care of personal grooming?: A Lot Help from another person toileting, which includes using toliet, bedpan, or urinal?: Total Help from another person bathing (including washing, rinsing, drying)?: A Lot Help from another person to put on and taking off regular upper body clothing?: A Lot Help from another person to put on and taking off regular lower body clothing?: Total 6 Click Score: 12    End of Session Equipment Utilized During Treatment: Oxygen  OT Visit Diagnosis: Muscle weakness (generalized) (M62.81);Pain   Activity Tolerance Patient limited by fatigue;Patient limited by pain   Patient Left in bed;with call bell/phone within reach   Nurse Communication          Time: 9507-2257 OT  Time Calculation (min): 26 min  Charges: OT General Charges $OT Visit: 1 Visit OT Treatments $Therapeutic Activity: 8-22 mins  Lesle Chris, OTR/L Acute Rehabilitation Services [phone] [place] pager [phone] office 09/15/2018   [place] 09/15/2018, 1:53 PM

## 2018-09-15 NOTE — Progress Notes (Signed)
RN notified that pt had 3-beat run of non-sustained vtach. NP made aware. Pt is asymptomatic. Will continue to monitor.

## 2018-09-15 NOTE — Progress Notes (Signed)
Physical Therapy Treatment Patient Details Name: Renee Noble MRN: [id number] DOB: [birth year] Today's Date: 09/15/2018    History of Present Illness Pt admitted through ED 08/28/18 2* acute on chronic respiratory failure.  Pt requiring intubation and extubated 09/01/18. Intubated 09/06/18 due to evolving hypercapnic respiratory failure and extubated 09/08/18. Pt wtih hx of syncope, DM, Fibromyalgia, CHF, and nephrectomy    PT Comments    Pt required total assist +2 for bed mobility today, and was only able to sit EOB ~1 minute due to pt reports of dizziness, LE pain, and back/neck pain. Pt states she wishes she could do more, and has hopes to return to ambulation at rehab. Pt plans to Renee/c to SNF over the weekend. PT to continue to follow acutely.   Sats WNL, HR WNL, BP WNL during session    Follow Up Recommendations  SNF     Equipment Recommendations  None recommended by PT    Recommendations for Other Services       Precautions / Restrictions Precautions Precautions: Fall Precaution Comments: home O2 (3.5 liters at rest, 4 with activity per pt) Restrictions Weight Bearing Restrictions: No    Mobility  Bed Mobility Overal bed mobility: Needs Assistance Bed Mobility: Supine to Sit;Sit to Supine;Rolling Rolling: Total assist;+2 for physical assistance   Supine to sit: Total assist;+2 for physical assistance;+2 for safety/equipment;HOB elevated Sit to supine: Total assist;+2 for physical assistance;+2 for safety/equipment;HOB elevated Sit to sidelying: Total assist;+2 for physical assistance General bed mobility comments: pt 0%, PT assisting pt in trunk elevation and lowering, OT assisting with translation of LEs to and from EOB. Pt reporting feeling dizzy, like she was going to pass out. BP 151/78 with pulse of 81 bpm. Pt required total assist positioning in bed x2, due to pt sliding down in bed.   Transfers Overall transfer level: (NT - pt limited by dizziness and fatigue)                  Ambulation/Gait                 Stairs             Wheelchair Mobility    Modified Rankin (Stroke Patients Only)       Balance Overall balance assessment: Needs assistance Sitting-balance support: Bilateral upper extremity supported;Feet supported Sitting balance-Leahy Scale: Poor Sitting balance - Comments: mod A for sitting balance, verbal cuing to use UE propping to support self in sitting. Pt able to sit for ~1 minute before requesting return to supine.                                     Cognition Arousal/Alertness: Awake/alert Behavior During Therapy: WFL for tasks assessed/performed Overall Cognitive Status: Within Functional Limits for tasks assessed                                 General Comments: When asked if pt can assist with LE translation, pt states "I can't", but PT observed pt moving LEs spontaneously      Exercises      General Comments General comments (skin integrity, edema, etc.): only tolerated sitting EOB about 1 minute.  Pt felt that she might pass out. BP 151/78 once repositioned in bed.      Pertinent Vitals/Pain Pain Assessment: Faces Faces Pain Scale: Hurts  even more Pain Location: back and neck due to chronic pain, LEs  Pain Descriptors / Indicators: Grimacing;Guarding;Discomfort Pain Intervention(s): Monitored during session;Repositioned;Limited activity within patient's tolerance    Home Living                      Prior Function            PT Goals (current goals can now be found in the care plan section) Acute Rehab PT Goals PT Goal Formulation: With patient Time For Goal Achievement: 09/22/18 Potential to Achieve Goals: Good Progress towards PT goals: Progressing toward goals    Frequency    Min 3X/week      PT Plan Current plan remains appropriate    Co-evaluation PT/OT/SLP Co-Evaluation/Treatment: Yes Reason for Co-Treatment: For  patient/therapist safety PT goals addressed during session: Mobility/safety with mobility OT goals addressed during session: Strengthening/ROM      AM-PAC PT "6 Clicks" Mobility   Outcome Measure  Help needed turning from your back to your side while in a flat bed without using bedrails?: Total Help needed moving from lying on your back to sitting on the side of a flat bed without using bedrails?: Total Help needed moving to and from a bed to a chair (including a wheelchair)?: Total Help needed standing up from a chair using your arms (e.g., wheelchair or bedside chair)?: Total Help needed to walk in hospital room?: Total Help needed climbing 3-5 steps with a railing? : Total 6 Click Score: 6    End of Session Equipment Utilized During Treatment: Oxygen Activity Tolerance: Patient limited by fatigue;Treatment limited secondary to medical complications (Comment) Patient left: in bed;with bed alarm set;with call bell/phone within reach Nurse Communication: Mobility status PT Visit Diagnosis: Difficulty in walking, not elsewhere classified (R26.2)     Time: 8127-5170 PT Time Calculation (min) (ACUTE ONLY): 26 min  Charges:  $Therapeutic Activity: 8-22 mins                     Renee Noble Renee Noble, PT Acute Rehabilitation Services Pager [phone]  Office [phone]    Renee Noble Renee Noble 09/15/2018, 3:38 PM

## 2018-09-15 NOTE — Progress Notes (Signed)
PROGRESS NOTE    Renee Noble  HER:[id number] DOB: [birth year] DOA: 08/28/2018 PCP: [name], PA-C   Brief Narrative: Patient is a 63 year old female with history of chronic hypoxic respiratory failure on 3 L oxygen at home, severe COPD, chronic diastolic CHF, diabetes type 2, obesity who presents to [facility] with acute on chronic diastolic heart failure, COPD exacerbation.  Patient had 3 to 4 days of progressive shortness of breath and orthopnea.  Patient was transferred to [facility] for COVID rule out.  COVID-19 came out to be negative twice.  On 08/30/2015 she became hypercapnic with increased work of breathing necessitating intubation and mechanical ventilation.  Extubated to nasal cannula on 09/01/2018.  She continued to remain noncompliant to fluid restriction and CPAP.  She was being planned for discharge but had to be  intubated again on 09/06/2018 and was moved to ICU due to sudden onset of lethargy due to hypercapnea.  Extubated on 09/09/18.Physical therapy evaluated her and recommended skilled nursing facility on discharge.SW following.  Patient has been given the list for the acceptable nursing homes for her however she is still working on making a decision along with her cousin.  Her hemoglobin dropped to 6.9 today and 1 unit of PRBC transfusion has been ordered.  She has had no complaints since last several days.  Assessment & Plan:   Principal Problem:   Acute on chronic respiratory failure with hypercapnia (HCC) Active Problems:   Type II diabetes mellitus with renal manifestations (HCC)   Encounter for orogastric (OG) tube placement   Chronic pain syndrome   Chronic obstructive pulmonary disease (HCC)   Acute on chronic diastolic CHF (congestive heart failure) (HCC)   Acute on chronic congestive heart failure (HCC)   Hypothyroidism   Acute exacerbation of chronic obstructive pulmonary disease (COPD) (HCC)   Atrial flutter with rapid ventricular response (HCC)    AKI (acute kidney injury) ([place])   Pressure ulcer   Endotracheal tube present   Palliative care encounter  Acute on chronic respiratory failure with hypoxia and hypercapnia: Secondary to diastolic CHF, COPD, obesity hypoventilation syndrome, sleep apnea. She has been intubated twice during this hospitalization.  She is on 3 L oxygen via nasal cannula at home but currently on 4 L.  Continue bronchodilators as needed. ABG done 09/11/2018 showed hypercapnia with CO2 of 90, her baseline CO2 is around 80. ABG 09/12/2018 showed pCO2 of 77 and pH of 7.49. She is a chronic retainer and this CO2 is likely her baseline. My previous colleague had discussed with her in length about the necessity of using CPAP or BiPAP at night and I have discussed with her about the importance of using CPAP or BiPAP at night in length as well.  Suspicion for COVID-19: Ruled out.  Negative tests x2.  Acute on chronic diastolic heart failure: TTE on 11/19 showed ejection fraction of 65%.  Presented with 30 pounds of weight gain.  She is very noncompliant with fluid intake.  Constantly asked for increasing the limit of fluid intake.  She was started on Lasix drip here.  Currently on lasix 40 mg daily BID.  She was on torsemide 20 mg daily at home.Bilateral lower extremity edema have improved. We will continue to monitor input/output, daily weight.  Fluid restriction to less than 1.5 L a day. She should follow-up with cardiology as an outpatient.  COPD exacerbation: Gold 4.FEV1 30%.  Continue current bronchodilators.  She is on steroid at home.Prednisone tapered to 10 mg  daily which she can continue on DC. Follow-up with pulmonology as an outpatient.  Obesity hypoventilation syndrome/sleep apnea:Very noncompliant nocturnal CPAP/bipap.  Working daily on compliance.  Hypokalemia: Resolved.  Will continue twice daily dose of potassium chloride for 1 more day.  Paroxysmal A. fib: Currently rate is controlled.  Anticoagulated with  Eliquis.  On oral Cardizem for rate control  AKI on CKD stage III: Baseline creatinine of 1.2.  Creatinine on baseline now.  Continue to monitor.  She has H/O  left nephrectomy.  Anemia of chronic disease/Thrombocytopenia: Iron studies indicate anemia of chronic disease.  Fecal occult blood test pending.  Slight drop in platelets, now 47.  Also slight drop in hemoglobin, now 6.9.  Will transfuse 1 unit of PRBC.  Watch closely while she is on Eliquis.  Diabetes mellitus type 2: Continue Lantus and sliding scale insulin, blood sugar reasonably controlled.  Hypothyroidism: Continue Synthyroid  Pressure injury Left/right ischial tuberosities, present on admission  Debility/deconditioning: Patient evaluated by physical therapy and recommended skilled nursing facility on discharge.SW following.  Right upper lobe pulmonary nodule: Recommended CT chest in June/July 2020.  Multiple intubations/noncompliance/multiple comorbidities: Pallliative care was following.  Reconsulted again for discussing on goals of care and CODE STATUS. Remains full code despite multiple discussions.   Nutrition Problem: Increased nutrient needs Etiology: acute illness    DVT prophylaxis: Eliquis Code Status: Full Family Communication: Discussed with patient about plan of care, no family available.  Patient communicates with her cousin herself. Disposition Plan: Skilled nursing facility.  She is still working on making a final decision about the choice on facility.  Case manager on board.  Consultants: PCCM  Procedures: Intubation x 2  Antimicrobials:  Anti-infectives (From admission, onward)   Start     Dose/Rate Route Frequency Ordered Stop   09/02/18 1100  amoxicillin-clavulanate (AUGMENTIN) 875-125 MG per tablet 1 tablet  Status:  Discontinued     1 tablet Oral Every 12 hours 09/02/18 1006 09/06/18 1401   08/30/18 2000  cefTRIAXone (ROCEPHIN) 1 g in sodium chloride 0.9 % 100 mL IVPB  Status:  Discontinued      1 g 200 mL/hr over 30 Minutes Intravenous Every 24 hours 08/30/18 1947 09/02/18 0919   08/30/18 2000  azithromycin (ZITHROMAX) 500 mg in sodium chloride 0.9 % 250 mL IVPB  Status:  Discontinued     500 mg 250 mL/hr over 60 Minutes Intravenous Every 24 hours 08/30/18 1947 09/02/18 0919      Subjective: Patient seen and examined.  No complaints.  Denies shortness of breath.  Objective: Vitals:   09/15/18 0500 09/15/18 0942 09/15/18 1009 09/15/18 1043  BP: 131/63 132/65 (!) 131/48 (!) 119/58  Pulse: 78  99 97  Resp: 16  18 16   Temp: 98.5 F (36.9 C)  98.5 F (36.9 C) 98.5 F (36.9 C)  TempSrc: Oral  Oral Oral  SpO2: 95%  99% 95%  Weight:      Height:        Intake/Output Summary (Last 24 hours) at 09/15/2018 1143 Last data filed at 09/15/2018 0558 Gross per 24 hour  Intake 476 ml  Output 1375 ml  Net -899 ml   Filed Weights   09/13/18 0314 09/14/18 0411 09/15/18 0447  Weight: 128.1 kg 126.8 kg 126.9 kg    Examination: General exam: Appears calm and comfortable ,Not in distress, morbidly obese  HEENT:PERRL,Oral mucosa moist, Ear/Nose normal on gross exam Respiratory system: Bilateral decreased air entry Cardiovascular system: Afib. No JVD, murmurs, rubs, gallops  or clicks. Gastrointestinal system: Abdomen is obese, soft and nontender.  Ventral hernia on the left subcostal region, abdominal wall edema. Central nervous system: Alert and oriented. No focal neurological deficits. Extremities: 1 + bilateral lower extremity edema, no clubbing ,no cyanosis, distal peripheral pulses palpable. Skin: Chronic venous stasis changes on bilateral lower extremities   Data Reviewed: I have personally reviewed following labs and imaging studies  CBC: Recent Labs  Lab 09/12/18 0500 09/13/18 0431 09/14/18 0345 09/14/18 1419 09/15/18 0401  WBC 6.0 10.4 6.3 6.9 5.1  NEUTROABS 5.1 9.2* 5.2 6.2 4.3  HGB 7.4* 8.1* 7.1* 7.1* 6.9*  HCT 27.3* 28.7* 25.9* 25.2* 25.8*  MCV 86.1 85.4 86.6  87.5 87.2  PLT 66* 63* 52* 51* 43*   Basic Metabolic Panel: Recent Labs  Lab 09/08/18 1700  09/11/18 0400 09/12/18 0500 09/13/18 0431 09/14/18 0345 09/15/18 0401  NA  --    < > 139 140 138 140 137  K  --    < > 2.9* 2.9* 3.1* 3.4* 4.0  CL  --    < > 77* 77* 80* 81* 82*  CO2  --    < > >50* >50* 48* 49* 46*  GLUCOSE  --    < > 79 78 72 120* 151*  BUN  --    < > 71* 52* 48* 46* 46*  CREATININE  --    < > 0.99 0.90 0.94 1.07* 1.15*  CALCIUM  --    < > 8.6* 8.4* 8.3* 8.4* 8.3*  MG 1.7  --  1.8  --   --   --   --   PHOS 3.0  --   --   --   --   --   --    < > = values in this interval not displayed.   GFR: Estimated Creatinine Clearance: 64.4 mL/min (A) (by C-G formula based on SCr of 1.15 mg/dL (H)). Liver Function Tests: Recent Labs  Lab 09/09/18 0301 09/14/18 0345 09/15/18 0401  AST 21 20 20   ALT 17 14 16   ALKPHOS 55 57 68  BILITOT 0.8 0.5 0.8  PROT 5.8* 4.9* 5.3*  ALBUMIN 3.4* 2.9* 3.1*   No results for input(s): LIPASE, AMYLASE in the last 168 hours. No results for input(s): AMMONIA in the last 168 hours. Coagulation Profile: No results for input(s): INR, PROTIME in the last 168 hours. Cardiac Enzymes: No results for input(s): CKTOTAL, CKMB, CKMBINDEX, TROPONINI in the last 168 hours. BNP (last 3 results) No results for input(s): PROBNP in the last 8760 hours. HbA1C: No results for input(s): HGBA1C in the last 72 hours. CBG: Recent Labs  Lab 09/14/18 1641 09/14/18 2022 09/14/18 2345 09/15/18 0417 09/15/18 0818  GLUCAP 186* 196* 163* 151* 123*   Lipid Profile: No results for input(s): CHOL, HDL, LDLCALC, TRIG, CHOLHDL, LDLDIRECT in the last 72 hours. Thyroid Function Tests: No results for input(s): TSH, T4TOTAL, FREET4, T3FREE, THYROIDAB in the last 72 hours. Anemia Panel: Recent Labs    09/14/18 1014  FERRITIN 35  TIBC 281  IRON 19*  RETICCTPCT 2.1   Sepsis Labs: No results for input(s): PROCALCITON, LATICACIDVEN in the last 168 hours.  No  results found for this or any previous visit (from the past 240 hour(s)).    Radiology Studies: No results found.   Scheduled Meds: . sodium chloride   Intravenous Once  . sodium chloride   Intravenous Once  . apixaban  5 mg Oral BID  . arformoterol  15  mcg Nebulization BID  . budesonide (PULMICORT) nebulizer solution  0.5 mg Nebulization BID  . Chlorhexidine Gluconate Cloth  6 each Topical Daily  . diltiazem  180 mg Oral Daily  . feeding supplement  1 Container Oral BID BM  . feeding supplement (ENSURE ENLIVE)  237 mL Oral BID BM  . feeding supplement (PRO-STAT SUGAR FREE 64)  30 mL Oral Daily  . furosemide  40 mg Oral BID  . insulin aspart  0-15 Units Subcutaneous Q4H  . insulin detemir  10 Units Subcutaneous BID  . ipratropium-albuterol  3 mL Nebulization TID  . levothyroxine  88 mcg Oral Q0600  . mouth rinse  15 mL Mouth Rinse BID  . multivitamin with minerals  1 tablet Oral Daily  . nutrition supplement (JUVEN)  1 packet Oral BID BM  . nystatin   Topical TID  . pantoprazole  40 mg Oral Daily  . polyethylene glycol  17 g Oral Daily  . potassium chloride  40 mEq Oral Once  . potassium chloride  40 mEq Oral BID  . predniSONE  10 mg Oral Q breakfast  . sodium chloride flush  10-40 mL Intracatheter Q12H   Continuous Infusions: None    LOS: 18 days    Time spent: 25 mins.More than 50% of that time was spent in counseling and/or coordination of care.   Darliss Cheney, MD [facility] Pager [phone]  If 7PM-7AM, please contact night-coverage [email or web address] Password [email or web address] 09/15/2018, 11:43 AM

## 2018-09-16 DIAGNOSIS — I959 Hypotension, unspecified: Secondary | ICD-10-CM | POA: Diagnosis not present

## 2018-09-16 DIAGNOSIS — G9340 Encephalopathy, unspecified: Secondary | ICD-10-CM | POA: Diagnosis not present

## 2018-09-16 DIAGNOSIS — E871 Hypo-osmolality and hyponatremia: Secondary | ICD-10-CM | POA: Diagnosis not present

## 2018-09-16 DIAGNOSIS — I130 Hypertensive heart and chronic kidney disease with heart failure and stage 1 through stage 4 chronic kidney disease, or unspecified chronic kidney disease: Secondary | ICD-10-CM | POA: Diagnosis not present

## 2018-09-16 DIAGNOSIS — J95851 Ventilator associated pneumonia: Secondary | ICD-10-CM | POA: Diagnosis not present

## 2018-09-16 DIAGNOSIS — K8689 Other specified diseases of pancreas: Secondary | ICD-10-CM | POA: Diagnosis not present

## 2018-09-16 DIAGNOSIS — E875 Hyperkalemia: Secondary | ICD-10-CM | POA: Diagnosis not present

## 2018-09-16 DIAGNOSIS — Z515 Encounter for palliative care: Secondary | ICD-10-CM | POA: Diagnosis not present

## 2018-09-16 DIAGNOSIS — G4733 Obstructive sleep apnea (adult) (pediatric): Secondary | ICD-10-CM | POA: Diagnosis not present

## 2018-09-16 DIAGNOSIS — I4891 Unspecified atrial fibrillation: Secondary | ICD-10-CM | POA: Diagnosis not present

## 2018-09-16 DIAGNOSIS — E662 Morbid (severe) obesity with alveolar hypoventilation: Secondary | ICD-10-CM | POA: Diagnosis present

## 2018-09-16 DIAGNOSIS — R161 Splenomegaly, not elsewhere classified: Secondary | ICD-10-CM | POA: Diagnosis not present

## 2018-09-16 DIAGNOSIS — Z9119 Patient's noncompliance with other medical treatment and regimen: Secondary | ICD-10-CM | POA: Diagnosis not present

## 2018-09-16 DIAGNOSIS — E1165 Type 2 diabetes mellitus with hyperglycemia: Secondary | ICD-10-CM | POA: Diagnosis not present

## 2018-09-16 DIAGNOSIS — J9620 Acute and chronic respiratory failure, unspecified whether with hypoxia or hypercapnia: Secondary | ICD-10-CM | POA: Diagnosis not present

## 2018-09-16 DIAGNOSIS — C642 Malignant neoplasm of left kidney, except renal pelvis: Secondary | ICD-10-CM | POA: Diagnosis not present

## 2018-09-16 DIAGNOSIS — E039 Hypothyroidism, unspecified: Secondary | ICD-10-CM | POA: Diagnosis not present

## 2018-09-16 DIAGNOSIS — I2781 Cor pulmonale (chronic): Secondary | ICD-10-CM | POA: Diagnosis not present

## 2018-09-16 DIAGNOSIS — R110 Nausea: Secondary | ICD-10-CM | POA: Diagnosis not present

## 2018-09-16 DIAGNOSIS — E1129 Type 2 diabetes mellitus with other diabetic kidney complication: Secondary | ICD-10-CM | POA: Diagnosis not present

## 2018-09-16 DIAGNOSIS — Z930 Tracheostomy status: Secondary | ICD-10-CM | POA: Diagnosis not present

## 2018-09-16 DIAGNOSIS — E872 Acidosis: Secondary | ICD-10-CM | POA: Diagnosis not present

## 2018-09-16 DIAGNOSIS — I50810 Right heart failure, unspecified: Secondary | ICD-10-CM | POA: Diagnosis not present

## 2018-09-16 DIAGNOSIS — D696 Thrombocytopenia, unspecified: Secondary | ICD-10-CM | POA: Diagnosis not present

## 2018-09-16 DIAGNOSIS — E611 Iron deficiency: Secondary | ICD-10-CM | POA: Diagnosis not present

## 2018-09-16 DIAGNOSIS — M2550 Pain in unspecified joint: Secondary | ICD-10-CM | POA: Diagnosis not present

## 2018-09-16 DIAGNOSIS — J441 Chronic obstructive pulmonary disease with (acute) exacerbation: Secondary | ICD-10-CM | POA: Diagnosis not present

## 2018-09-16 DIAGNOSIS — D61818 Other pancytopenia: Secondary | ICD-10-CM | POA: Diagnosis not present

## 2018-09-16 DIAGNOSIS — I4821 Permanent atrial fibrillation: Secondary | ICD-10-CM | POA: Diagnosis not present

## 2018-09-16 DIAGNOSIS — R911 Solitary pulmonary nodule: Secondary | ICD-10-CM | POA: Diagnosis not present

## 2018-09-16 DIAGNOSIS — J449 Chronic obstructive pulmonary disease, unspecified: Secondary | ICD-10-CM | POA: Diagnosis not present

## 2018-09-16 DIAGNOSIS — J90 Pleural effusion, not elsewhere classified: Secondary | ICD-10-CM | POA: Diagnosis not present

## 2018-09-16 DIAGNOSIS — Z66 Do not resuscitate: Secondary | ICD-10-CM | POA: Diagnosis not present

## 2018-09-16 DIAGNOSIS — C641 Malignant neoplasm of right kidney, except renal pelvis: Secondary | ICD-10-CM | POA: Diagnosis not present

## 2018-09-16 DIAGNOSIS — Z20828 Contact with and (suspected) exposure to other viral communicable diseases: Secondary | ICD-10-CM | POA: Diagnosis present

## 2018-09-16 DIAGNOSIS — R0902 Hypoxemia: Secondary | ICD-10-CM | POA: Diagnosis not present

## 2018-09-16 DIAGNOSIS — E1140 Type 2 diabetes mellitus with diabetic neuropathy, unspecified: Secondary | ICD-10-CM | POA: Diagnosis not present

## 2018-09-16 DIAGNOSIS — D72819 Decreased white blood cell count, unspecified: Secondary | ICD-10-CM | POA: Diagnosis not present

## 2018-09-16 DIAGNOSIS — G8929 Other chronic pain: Secondary | ICD-10-CM | POA: Diagnosis not present

## 2018-09-16 DIAGNOSIS — E785 Hyperlipidemia, unspecified: Secondary | ICD-10-CM | POA: Diagnosis not present

## 2018-09-16 DIAGNOSIS — R935 Abnormal findings on diagnostic imaging of other abdominal regions, including retroperitoneum: Secondary | ICD-10-CM | POA: Diagnosis not present

## 2018-09-16 DIAGNOSIS — I5082 Biventricular heart failure: Secondary | ICD-10-CM | POA: Diagnosis not present

## 2018-09-16 DIAGNOSIS — R188 Other ascites: Secondary | ICD-10-CM | POA: Diagnosis not present

## 2018-09-16 DIAGNOSIS — Z7401 Bed confinement status: Secondary | ICD-10-CM | POA: Diagnosis not present

## 2018-09-16 DIAGNOSIS — N179 Acute kidney failure, unspecified: Secondary | ICD-10-CM | POA: Diagnosis not present

## 2018-09-16 DIAGNOSIS — N183 Chronic kidney disease, stage 3 (moderate): Secondary | ICD-10-CM | POA: Diagnosis not present

## 2018-09-16 DIAGNOSIS — R001 Bradycardia, unspecified: Secondary | ICD-10-CM | POA: Diagnosis not present

## 2018-09-16 DIAGNOSIS — Z4659 Encounter for fitting and adjustment of other gastrointestinal appliance and device: Secondary | ICD-10-CM | POA: Diagnosis not present

## 2018-09-16 DIAGNOSIS — I4892 Unspecified atrial flutter: Secondary | ICD-10-CM | POA: Diagnosis not present

## 2018-09-16 DIAGNOSIS — Z7189 Other specified counseling: Secondary | ICD-10-CM | POA: Diagnosis not present

## 2018-09-16 DIAGNOSIS — J9621 Acute and chronic respiratory failure with hypoxia: Secondary | ICD-10-CM | POA: Diagnosis not present

## 2018-09-16 DIAGNOSIS — Z6841 Body Mass Index (BMI) 40.0 and over, adult: Secondary | ICD-10-CM | POA: Diagnosis not present

## 2018-09-16 DIAGNOSIS — J189 Pneumonia, unspecified organism: Secondary | ICD-10-CM | POA: Diagnosis not present

## 2018-09-16 DIAGNOSIS — D72818 Other decreased white blood cell count: Secondary | ICD-10-CM | POA: Diagnosis not present

## 2018-09-16 DIAGNOSIS — R0602 Shortness of breath: Secondary | ICD-10-CM | POA: Diagnosis not present

## 2018-09-16 DIAGNOSIS — C259 Malignant neoplasm of pancreas, unspecified: Secondary | ICD-10-CM | POA: Diagnosis not present

## 2018-09-16 DIAGNOSIS — I5032 Chronic diastolic (congestive) heart failure: Secondary | ICD-10-CM | POA: Diagnosis not present

## 2018-09-16 DIAGNOSIS — G894 Chronic pain syndrome: Secondary | ICD-10-CM | POA: Diagnosis not present

## 2018-09-16 DIAGNOSIS — L8990 Pressure ulcer of unspecified site, unspecified stage: Secondary | ICD-10-CM | POA: Diagnosis not present

## 2018-09-16 DIAGNOSIS — I5033 Acute on chronic diastolic (congestive) heart failure: Secondary | ICD-10-CM | POA: Diagnosis not present

## 2018-09-16 DIAGNOSIS — I10 Essential (primary) hypertension: Secondary | ICD-10-CM | POA: Diagnosis not present

## 2018-09-16 DIAGNOSIS — R000 Tachycardia, unspecified: Secondary | ICD-10-CM | POA: Diagnosis not present

## 2018-09-16 DIAGNOSIS — N2889 Other specified disorders of kidney and ureter: Secondary | ICD-10-CM | POA: Diagnosis not present

## 2018-09-16 DIAGNOSIS — M6281 Muscle weakness (generalized): Secondary | ICD-10-CM | POA: Diagnosis not present

## 2018-09-16 DIAGNOSIS — J9622 Acute and chronic respiratory failure with hypercapnia: Secondary | ICD-10-CM | POA: Diagnosis not present

## 2018-09-16 DIAGNOSIS — G92 Toxic encephalopathy: Secondary | ICD-10-CM | POA: Diagnosis not present

## 2018-09-16 DIAGNOSIS — R609 Edema, unspecified: Secondary | ICD-10-CM | POA: Diagnosis not present

## 2018-09-16 DIAGNOSIS — Y848 Other medical procedures as the cause of abnormal reaction of the patient, or of later complication, without mention of misadventure at the time of the procedure: Secondary | ICD-10-CM | POA: Diagnosis not present

## 2018-09-16 DIAGNOSIS — R229 Localized swelling, mass and lump, unspecified: Secondary | ICD-10-CM | POA: Diagnosis not present

## 2018-09-16 DIAGNOSIS — J9611 Chronic respiratory failure with hypoxia: Secondary | ICD-10-CM | POA: Diagnosis not present

## 2018-09-16 DIAGNOSIS — M797 Fibromyalgia: Secondary | ICD-10-CM | POA: Diagnosis not present

## 2018-09-16 DIAGNOSIS — E1122 Type 2 diabetes mellitus with diabetic chronic kidney disease: Secondary | ICD-10-CM | POA: Diagnosis not present

## 2018-09-16 LAB — CBC WITH DIFFERENTIAL/PLATELET
Abs Immature Granulocytes: 0.01 10*3/uL (ref 0.00–0.07)
Basophils Absolute: 0 10*3/uL (ref 0.0–0.1)
Basophils Relative: 0 %
Eosinophils Absolute: 0 10*3/uL (ref 0.0–0.5)
Eosinophils Relative: 0 %
HCT: 27 % — ABNORMAL LOW (ref 36.0–46.0)
Hemoglobin: 7.7 g/dL — ABNORMAL LOW (ref 12.0–15.0)
Immature Granulocytes: 0 %
Lymphocytes Relative: 8 %
Lymphs Abs: 0.4 10*3/uL — ABNORMAL LOW (ref 0.7–4.0)
MCH: 25.4 pg — ABNORMAL LOW (ref 26.0–34.0)
MCHC: 28.5 g/dL — ABNORMAL LOW (ref 30.0–36.0)
MCV: 89.1 fL (ref 80.0–100.0)
Monocytes Absolute: 0.3 10*3/uL (ref 0.1–1.0)
Monocytes Relative: 6 %
Neutro Abs: 4.4 10*3/uL (ref 1.7–7.7)
Neutrophils Relative %: 86 %
Platelets: 37 10*3/uL — ABNORMAL LOW (ref 150–400)
RBC: 3.03 MIL/uL — ABNORMAL LOW (ref 3.87–5.11)
RDW: 18.7 % — ABNORMAL HIGH (ref 11.5–15.5)
WBC: 5.1 10*3/uL (ref 4.0–10.5)
nRBC: 0 % (ref 0.0–0.2)

## 2018-09-16 LAB — BASIC METABOLIC PANEL
Anion gap: 10 (ref 5–15)
BUN: 45 mg/dL — ABNORMAL HIGH (ref 8–23)
CO2: 44 mmol/L — ABNORMAL HIGH (ref 22–32)
Calcium: 8.6 mg/dL — ABNORMAL LOW (ref 8.9–10.3)
Chloride: 84 mmol/L — ABNORMAL LOW (ref 98–111)
Creatinine, Ser: 1.08 mg/dL — ABNORMAL HIGH (ref 0.44–1.00)
GFR calc Af Amer: >60 mL/min (ref >60)
GFR calc non Af Amer: 54 mL/min — ABNORMAL LOW (ref >60)
Glucose, Bld: 117 mg/dL — ABNORMAL HIGH (ref 70–99)
Potassium: 4 mmol/L (ref 3.5–5.1)
Sodium: 138 mmol/L (ref 135–145)

## 2018-09-16 LAB — TYPE AND SCREEN
ABO/RH(D): A POS
Antibody Screen: NEGATIVE
Unit division: 0

## 2018-09-16 LAB — BPAM RBC
Blood Product Expiration Date: 202005062359
ISSUE DATE / TIME: 202005011020
Unit Type and Rh: 6200

## 2018-09-16 LAB — GLUCOSE, CAPILLARY
Glucose-Capillary: 118 mg/dL — ABNORMAL HIGH (ref 70–99)
Glucose-Capillary: 122 mg/dL — ABNORMAL HIGH (ref 70–99)
Glucose-Capillary: 137 mg/dL — ABNORMAL HIGH (ref 70–99)
Glucose-Capillary: 225 mg/dL — ABNORMAL HIGH (ref 70–99)

## 2018-09-16 LAB — NOVEL CORONAVIRUS, NAA (HOSP ORDER, SEND-OUT TO REF LAB; TAT 18-24 HRS): SARS-CoV-2, NAA: NOT DETECTED

## 2018-09-16 MED ORDER — INSULIN DETEMIR 100 UNIT/ML ~~LOC~~ SOLN: 10.0000 [IU] | Freq: Two times a day (BID) | SUBCUTANEOUS | 11 refills | Status: AC

## 2018-09-16 MED ORDER — POTASSIUM CHLORIDE CRYS ER 20 MEQ PO TBCR: 40.0000 meq | EXTENDED_RELEASE_TABLET | Freq: Every day | ORAL | 0 refills | Status: AC

## 2018-09-16 MED ORDER — FUROSEMIDE 40 MG PO TABS: 40.0000 mg | ORAL_TABLET | Freq: Two times a day (BID) | ORAL | 0 refills | Status: AC

## 2018-09-16 MED ORDER — MORPHINE SULFATE ER 30 MG PO TBCR: 30.0000 mg | EXTENDED_RELEASE_TABLET | Freq: Two times a day (BID) | ORAL | 0 refills | Status: AC

## 2018-09-16 MED ORDER — ARFORMOTEROL TARTRATE 15 MCG/2ML IN NEBU: 15.0000 ug | INHALATION_SOLUTION | Freq: Two times a day (BID) | RESPIRATORY_TRACT | 0 refills | Status: AC

## 2018-09-16 NOTE — TOC Progression Note (Addendum)
Transition of Care [facility]) - Progression Note    Patient Details  Name: Renee Noble MRN: [id number] Date of Birth: August 22, 1955  [facility] of [facility] CM/SW Contact  Marcheta Grammes Rexene Alberts, RN Phone Number: 09/16/2018, 12:26 PM  Clinical Narrative:     [facility] [place] and waiting call back on acceptance for today.   1:17 pm Received call back from admissions and pt will dc today to [facility], on [address], [place] room [address]. Unit RN to call report to [facility] # [phone]. [facility] called for transport.     Expected Discharge Plan: Skilled Nursing Facility Barriers to Discharge: Other (comment)(Await patient choice for SNF.)  Expected Discharge Plan and Services Expected Discharge Plan: [place]   Discharge Planning Services: CM Consult     Expected Discharge Date: 09/16/18                                     Social Determinants of Health (SDOH) Interventions    Readmission Risk Interventions No flowsheet data found.

## 2018-09-16 NOTE — Progress Notes (Signed)
Report called to [facility].  All questions answered.  Packet given to [facility].

## 2018-09-16 NOTE — Discharge Summary (Signed)
Physician Discharge Summary  Renee Noble:[id number] DOB: Feb 10, 1956 DOA: 08/28/2018  PCP: [name], PA-C  Admit date: 08/28/2018 Discharge date: 09/16/2018  Admitted From: Home Disposition: Skilled nursing facility  Recommendations for Outpatient Follow-up:  1. Follow up with PCP in 1-2 weeks 2. Please obtain BMP/CBC in one week 3. Please follow up on the following pending results:  Home Health: No Equipment/Devices: CPAP machine  Discharge Condition: Stable CODE STATUS: Full code Diet recommendation: Low-sodium diet  Subjective: Patient seen and examined.  She has no complaints.  Brief/Interim Summary: Patient is a 63 year old female with history of chronic hypoxic respiratory failure on 3 L oxygen at home, severe COPD, chronic diastolic CHF, diabetes type 2, obesity who presents to [facility] with acute on chronic diastolic heart failure, COPD exacerbation.  Patient had 3 to 4 days of progressive shortness of breath and orthopnea.  Patient was transferred to [facility] for COVID rule out.  COVID-19 came out to be negative twice.  On 08/30/2015 she became hypercapnic with increased work of breathing necessitating intubation and mechanical ventilation.  Extubated to nasal cannula on 09/01/2018.  She continued to remain noncompliant to fluid restriction and CPAP.  She was being planned for discharge but had to be  intubated again on 09/06/2018 and was moved to ICU due to sudden onset of lethargy due to hypercapnea.  Extubated on 09/09/18.Physical therapy evaluated her and recommended skilled nursing facility on discharge  Her hemoglobin dropped to 6.9 today and 1 unit of PRBC transfusion has been ordered.  She has had no complaints since last several days.  At some point in time, patient was also diagnosed with paroxysmal atrial fibrillation for which she was started on Eliquis.  Patient also has a history of chronic thrombocytopenia and her platelets usually range around 80.   Since she was started on Eliquis, her platelets have been dropping and currently are 37 although there is no active bleeding or any signs of bleeding.  She had a drop in her hemoglobin to 6.9 yesterday for which she received 1 unit of PRBC transfusion as mentioned above.  Due to bleeding and thrombocytopenia, I had a long discussion with the patient mentioning that she will be at high risk of bleeding due to thrombocytopenia if we were to continue Eliquis.  We made an informed decision between the medical team and the patient to discontinue her Eliquis excepting the fact that this would leave her with the risk of thromboembolism.  Patient verbalized understanding and agreed with this plan.  She also received antibiotics which she completed here and she also received steroids which are going to be discontinued at the time of discharge.  Patient has remained medically stable for past 3 days however it took her 3 days to decide on specific nursing home which was decided finally yesterday and now placement has been arranged for her and the fact that she is hemodynamically stable so she will be discharged today.  Discharge Diagnoses:  Principal Problem:   Acute on chronic respiratory failure with hypercapnia (HCC) Active Problems:   Type II diabetes mellitus with renal manifestations ([place])   Encounter for orogastric (OG) tube placement   Chronic pain syndrome   Chronic obstructive pulmonary disease (HCC)   Acute on chronic diastolic CHF (congestive heart failure) (HCC)   Acute on chronic congestive heart failure (HCC)   Hypothyroidism   Acute exacerbation of chronic obstructive pulmonary disease (COPD) (HCC)   Atrial flutter with rapid ventricular response ([place])  AKI (acute kidney injury) ([place])   Pressure ulcer   Endotracheal tube present   Palliative care encounter    Discharge Instructions  Discharge Instructions    Discharge patient   Complete by:  As directed    Discharge disposition:   03-Skilled [place]   Discharge patient date:  09/16/2018   PICC line removal   Complete by:  As directed      Allergies as of 09/16/2018      Reactions   Gabapentin Anaphylaxis   Lyrica [pregabalin] Shortness Of Breath   Trouble breathing   Ketorolac Tromethamine Hives   Lisinopril Cough      Medication List    STOP taking these medications   insulin glargine 100 UNIT/ML injection Commonly known as:  Lantus   potassium chloride 10 MEQ tablet Commonly known as:  K-DUR   PROBIOTIC PO   torsemide 20 MG tablet Commonly known as:  Demadex     TAKE these medications   albuterol 108 (90 Base) MCG/ACT inhaler Commonly known as:  ProAir HFA Inhale 1-2 puffs into the lungs every 6 (six) hours as needed for wheezing or shortness of breath.   arformoterol 15 MCG/2ML Nebu Commonly known as:  BROVANA Take 2 mLs (15 mcg total) by nebulization 2 (two) times daily.   budesonide-formoterol 160-4.5 MCG/ACT inhaler Commonly known as:  Symbicort Inhale 2 puffs into the lungs 2 (two) times daily.   CENTRUM SILVER PO Take by mouth.   clotrimazole-betamethasone cream Commonly known as:  Lotrisone Apply 1 application topically 2 (two) times daily as needed. What changed:  reasons to take this   dextromethorphan-guaiFENesin 30-600 MG 12hr tablet Commonly known as:  MUCINEX DM Take 1 tablet by mouth 2 (two) times daily as needed for cough. What changed:  when to take this   diltiazem 180 MG 24 hr capsule Commonly known as:  CARDIZEM CD Take 1 capsule (180 mg total) by mouth daily.   furosemide 40 MG tablet Commonly known as:  LASIX Take 1 tablet (40 mg total) by mouth 2 (two) times daily.   glucose blood test strip Commonly known as:  Accu-Chek Aviva Check blood sugars 4 times per day for diabetes. Dx:E11.29   insulin detemir 100 UNIT/ML injection Commonly known as:  LEVEMIR Inject 0.1 mLs (10 Units total) into the skin 2 (two) times daily.   insulin lispro 100  UNIT/ML injection Commonly known as:  HumaLOG Inject 0.15 mLs (15 Units total) into the skin 3 (three) times daily as needed for high blood sugar. Uses sliding scale. Sugar over 200 will give insulin.   ipratropium-albuterol 0.5-2.5 (3) MG/3ML Soln Commonly known as:  DUONEB Take 3 mLs by nebulization 4 (four) times daily. Dx: J44.9 What changed:    when to take this  reasons to take this  additional instructions   levothyroxine 88 MCG tablet Commonly known as:  SYNTHROID TAKE ONE TABLET BY MOUTH DAILY What changed:  when to take this   morphine 30 MG 12 hr tablet Commonly known as:  MS CONTIN Take 1 tablet (30 mg total) by mouth every 12 (twelve) hours.   nortriptyline 25 MG capsule Commonly known as:  PAMELOR Take 1 capsule (25 mg total) by mouth at bedtime.   nystatin powder Commonly known as:  MYCOSTATIN/NYSTOP APPLY TOPICALLY TWICE DAILY AS NEEDED FOR YEAST INFECTION What changed:  See the new instructions.   OXYGEN Inhale 3.5 L into the lungs continuous. O2 3.5L at home.   potassium chloride SA 20  MEQ tablet Commonly known as:  K-DUR Take 2 tablets (40 mEq total) by mouth daily.   pravastatin 20 MG tablet Commonly known as:  PRAVACHOL TAKE ONE TABLET BY MOUTH EVERY DAY What changed:  when to take this      Follow-up Information    [name], PA-C Follow up in 1 week(s).   Specialty:  Family Medicine Contact information: [address] [place] [address] [phone]          Allergies  Allergen Reactions  . Gabapentin Anaphylaxis  . Lyrica [Pregabalin] Shortness Of Breath    Trouble breathing  . Ketorolac Tromethamine Hives  . Lisinopril Cough    Consultations:  PCCM   Procedures/Studies: Dg Abd 1 View  Result Date: 09/06/2018 CLINICAL DATA:  OG tube placement EXAM: ABDOMEN - 1 VIEW COMPARISON:  None. FINDINGS: OG tube tip in the mid stomach. IMPRESSION: OG tube tip in the mid stomach. Electronically Signed    By: Rolm Baptise M.D.   On: 09/06/2018 23:56   US Renal  Result Date: 09/04/2018 CLINICAL DATA:  Acute kidney injury. History of left nephrectomy, chronic kidney disease stage 3 and bladder tack procedure. EXAM: RENAL / URINARY TRACT ULTRASOUND COMPLETE COMPARISON:  06/21/2016 renal sonogram. FINDINGS: Right Kidney: Renal measurements: 16.7 x 6.7 x 5.2 cm = volume: 302 mL. Compensatory hypertrophy of the right kidney. Renal parenchymal thickness and echogenicity within normal limits. No hydronephrosis. No renal mass. Left Kidney: Surgically absent. No mass or fluid collection demonstrated in the left nephrectomy bed. Bladder: Limited visualization of the nondistended grossly normal bladder. IMPRESSION: 1. Compensatory hypertrophy of the otherwise normal right kidney. No right hydronephrosis. 2. Left nephrectomy. 3. Limited visualization of the nondistended normal appearing bladder. Electronically Signed   By: Ilona Sorrel M.D.   On: 09/04/2018 08:37   Dg Chest Port 1 View  Result Date: 09/11/2018 CLINICAL DATA:  63 year old female with PICC placement EXAM: PORTABLE CHEST 1 VIEW COMPARISON:  09/11/2018 FINDINGS: Clavicles are midline on the current chest x-ray. The tip of the PICC terminates just to the left of midline. Left subclavian central venous catheter again terminates in the region of the brachiocephalic vein. Hazy opacities at the bilateral lung bases are unchanged with blunting of the bilateral costophrenic angles and obscuration of the bilateral hemidiaphragm. No pneumothorax. IMPRESSION: The right upper extremity PICC again terminates with the tip terminating over the midline. The plain film is indeterminate for specifically locating the tip. If there is any further concern, correlation with blood gas or pressure transduction may be useful. Similar appearance of bilateral pleural effusions and associated atelectasis/consolidation Unchanged left subclavian central line Electronically Signed   By:  Corrie Mckusick D.O.   On: 09/11/2018 20:42   Dg Chest Port 1 View  Result Date: 09/11/2018 CLINICAL DATA:  63 year old female with central line placement EXAM: PORTABLE CHEST 1 VIEW COMPARISON:  09/09/2018, 09/08/2018 FINDINGS: Cardiomediastinal silhouette unchanged in size and contour with cardiomegaly. Interval placement of right upper extremity PICC, with the tip terminating on the left aspect of the spine. There is left rotation the patient. Unchanged position of left subclavian central venous catheter with the tip appearing to terminate in the brachiocephalic vein. Hazy opacities of the bilateral lung bases with obscuration of the retrocardiac region of the bilateral cardiophrenic angles. No new airspace opacity. IMPRESSION: Interval placement of right upper extremity PICC. The right rotation of the chest x-ray limits evaluation of the tip of the catheter, which terminates over  the left aspect of the spine. While this most likely is projectional, arterial placement cannot be excluded on this plain film. If there is any concern for the location, correlation with blood gas may be useful, or repeat plain film. Unchanged left subclavian central venous catheter. Similar appearance of pleural effusions with associated atelectasis/consolidation. Electronically Signed   By: Corrie Mckusick D.O.   On: 09/11/2018 19:25   Dg Chest Port 1 View  Result Date: 09/09/2018 CLINICAL DATA:  Pulmonary edema. EXAM: PORTABLE CHEST 1 VIEW COMPARISON:  September 08, 2018 FINDINGS: The left central line is been pulled back slightly in the interval, terminating just proximal to the brachiocephalic confluence. No pneumothorax. Bilateral pleural effusions with underlying opacity. Decreased pulmonary edema. IMPRESSION: 1. Left central line is been pulled back slightly in the interval, likely terminating just proximal to the brachiocephalic confluence. Removal of ET and NG tubes. 2. Bilateral pleural effusions with underlying opacities,  likely atelectasis. Electronically Signed   By: Dorise Bullion III M.D   On: 09/09/2018 05:28   Dg Chest Port 1 View  Result Date: 09/08/2018 CLINICAL DATA:  63 year old female with respiratory failure. Negative for COVID-19 earlier this month. EXAM: PORTABLE CHEST 1 VIEW COMPARISON:  09/06/2018 and earlier. FINDINGS: Portable AP semi upright view at 0441 hours. Stable endotracheal tube. Enteric tube has been placed and courses to the abdomen, tip not included. Stable left subclavian central line. Stable cardiomegaly and mediastinal contours. Continued dense lung base opacity greater on the right. Mildly increased veiling opacity in the lower lungs bilaterally. No pneumothorax. Upper lung pulmonary vascularity appears mildly increased. IMPRESSION: 1. Enteric tube placed and courses to the abdomen, tip not included. 2. Otherwise stable lines and tubes. 3. Continued lower lobe collapse or consolidation with suspicion of increased bilateral pleural effusions. 4. Mildly increased pulmonary vascularity, consider mild interstitial edema. Electronically Signed   By: Genevie Ann M.D.   On: 09/08/2018 07:42   Dg Chest Port 1 View  Result Date: 09/06/2018 CLINICAL DATA:  Acute respiratory distress EXAM: PORTABLE CHEST 1 VIEW COMPARISON:  08/30/2018, 08/28/2018, 07/14/2018 FINDINGS: Endotracheal tube tip is about 18 mm superior to the carina. Left-sided central venous catheter tip over the brachiocephalic region. Removal of esophageal tube. Continued bilateral pleural effusions and left greater than right basilar consolidations. Enlarged cardiomediastinal silhouette with vascular congestion. No pneumothorax. IMPRESSION: 1. Removal of esophageal tube. 2. Overall no significant interval change in cardiomegaly with vascular congestion Scala bilateral pleural effusions and left greater than right basilar airspace disease. Electronically Signed   By: Donavan Foil M.D.   On: 09/06/2018 23:45   Dg Chest Port 1 View  Result  Date: 08/30/2018 CLINICAL DATA:  Check endotracheal tube placement EXAM: PORTABLE CHEST 1 VIEW COMPARISON:  Film from earlier in the same day. FINDINGS: Endotracheal tube, gastric catheter and left subclavian central line are noted in satisfactory position. No pneumothorax is seen. Left basilar infiltrate with associated effusion is noted. Right-sided effusion is seen as well with mild basilar infiltrate/atelectasis. No bony abnormality is noted. IMPRESSION: Tubes and lines as described above. Electronically Signed   By: Inez Catalina M.D.   On: 08/30/2018 20:34   Dg Chest Port 1 View  Result Date: 08/30/2018 CLINICAL DATA:  COPD. EXAM: PORTABLE CHEST 1 VIEW COMPARISON:  August 28, 2018 FINDINGS: The focal infiltrate in the right base has improved. Left retrocardiac opacity remains. No other changes. IMPRESSION: Improving focal opacity in the right base. Stable to mildly improved left retrocardiac opacity. Electronically Signed  By: Dorise Bullion III M.D   On: 08/30/2018 09:19   Dg Chest Portable 1 View  Result Date: 08/28/2018 CLINICAL DATA:  63 year old female with a history of shortness of breath EXAM: PORTABLE CHEST 1 VIEW COMPARISON:  07/14/2018, 06/08/2018 FINDINGS: Cardiomediastinal silhouette unchanged in size and contour with cardiomegaly. Mixed interstitial and airspace opacities of the bilateral lungs, worse than the comparison. Retrocardiac opacity with obscuration of the left hemidiaphragm and blunting of left costophrenic angle. Partial obscuration of the right hemidiaphragm with blunting of the costophrenic angle. IMPRESSION: Mixed interstitial and airspace opacities of the bilateral lungs, worse than the comparison, with the differential including multifocal infection and/or edema. Likely bibasilar pleural effusions and associated atelectasis/consolidation. Cardiomegaly Electronically Signed   By: Corrie Mckusick D.O.   On: 08/28/2018 18:38   Dg Abd Portable 1v  Result Date:  08/30/2018 CLINICAL DATA:  Check gastric catheter placement EXAM: PORTABLE ABDOMEN - 1 VIEW COMPARISON:  None. FINDINGS: Gastric catheter is noted extending into the mid stomach. No obstructive changes are seen. No free air is noted. Bibasilar changes are noted left greater than right similar to that seen on prior chest x-ray. IMPRESSION: Gastric catheter within the stomach. Electronically Signed   By: Inez Catalina M.D.   On: 08/30/2018 20:35   [place] Ekg Site Rite  Result Date: 09/11/2018 If Site Rite image not attached, placement could not be confirmed due to current cardiac rhythm.     Discharge Exam: Vitals:   09/15/18 2033 09/16/18 0506  BP: 131/79 123/63  Pulse: 89 79  Resp: 18 18  Temp: (!) 97.4 F (36.3 C) 98.9 F (37.2 C)  SpO2: 99% 99%   Vitals:   09/15/18 1318 09/15/18 1943 09/15/18 2033 09/16/18 0506  BP: 121/77  131/79 123/63  Pulse: 82  89 79  Resp: 20  18 18   Temp: 98.4 F (36.9 C)  (!) 97.4 F (36.3 C) 98.9 F (37.2 C)  TempSrc: Oral  Oral Oral  SpO2: 98% 98% 99% 99%  Weight:      Height:        General: Pt is alert, awake, not in acute distress but morbidly obese Cardiovascular: RRR, S1/S2 +, no rubs, no gallops Respiratory: Diminished breath sounds bilaterally, more so in the bases. Abdominal: Soft, NT, ND, bowel sounds + Extremities:  minimal pitting edema, no cyanosis    The results of significant diagnostics from this hospitalization (including imaging, microbiology, ancillary and laboratory) are listed below for reference.     Microbiology: Recent Results (from the past 240 hour(s))  Novel Coronavirus, NAA (hospital order; send-out to ref lab)     Status: None   Collection Time: 09/15/18  3:36 PM  Result Value Ref Range Status   SARS-CoV-2, NAA NOT DETECTED NOT DETECTED Final    Comment: (NOTE) This test was developed and its performance characteristics determined by [employer]. This test has not been FDA cleared or approved. This  test has been authorized by FDA under an Emergency Use Authorization (EUA). This test is only authorized for the duration of time the declaration that circumstances exist justifying the authorization of the emergency use of in vitro diagnostic tests for detection of SARS-CoV-2 virus and/or diagnosis of COVID-19 infection under section 564(b)(1) of the Act, 21 U.S.C. 952WUX-3(K)(4), unless the authorization is terminated or revoked sooner. When diagnostic testing is negative, the possibility of a false negative result should be considered in the context of a patient's recent exposures and the presence of clinical signs and  symptoms consistent with COVID-19. An individual without symptoms of COVID-19 and who is not shedding SARS-CoV-2 virus would expect to have a negative (not detected) result in this assay. Performed  At: [facility] [place], [place] [id number] Rush Farmer MD FG:[id number]    [place]  Final    Comment: Performed at [facility] [address], [place], [place] [address]     Labs: BNP (last 3 results) Recent Labs    07/14/18 1740 08/28/18 1824 09/08/18 0425  BNP 581.0* 858.0* 810.1*   Basic Metabolic Panel: Recent Labs  Lab 09/11/18 0400 09/12/18 0500 09/13/18 0431 09/14/18 0345 09/15/18 0401 09/16/18 0432  NA 139 140 138 140 137 138  K 2.9* 2.9* 3.1* 3.4* 4.0 4.0  CL 77* 77* 80* 81* 82* 84*  CO2 >50* >50* 48* 49* 46* 44*  GLUCOSE 79 78 72 120* 151* 117*  BUN 71* 52* 48* 46* 46* 45*  CREATININE 0.99 0.90 0.94 1.07* 1.15* 1.08*  CALCIUM 8.6* 8.4* 8.3* 8.4* 8.3* 8.6*  MG 1.8  --   --   --   --   --    Liver Function Tests: Recent Labs  Lab 09/14/18 0345 09/15/18 0401  AST 20 20  ALT 14 16  ALKPHOS 57 68  BILITOT 0.5 0.8  PROT 4.9* 5.3*  ALBUMIN 2.9* 3.1*   No results for input(s): LIPASE, AMYLASE in the last 168 hours. No results for input(s): AMMONIA in the last 168  hours. CBC: Recent Labs  Lab 09/13/18 0431 09/14/18 0345 09/14/18 1419 09/15/18 0401 09/15/18 1629 09/16/18 0432  WBC 10.4 6.3 6.9 5.1 6.5 5.1  NEUTROABS 9.2* 5.2 6.2 4.3  --  4.4  HGB 8.1* 7.1* 7.1* 6.9* 8.1* 7.7*  HCT 28.7* 25.9* 25.2* 25.8* 28.2* 27.0*  MCV 85.4 86.6 87.5 87.2 88.1 89.1  PLT 63* 52* 51* 43* 43* 37*   Cardiac Enzymes: No results for input(s): CKTOTAL, CKMB, CKMBINDEX, TROPONINI in the last 168 hours. BNP: Invalid input(s): POCBNP CBG: Recent Labs  Lab 09/15/18 1708 09/15/18 2031 09/16/18 0004 09/16/18 0503 09/16/18 0759  GLUCAP 185* 173* 137* 122* 118*   D-Dimer No results for input(s): DDIMER in the last 72 hours. Hgb A1c No results for input(s): HGBA1C in the last 72 hours. Lipid Profile No results for input(s): CHOL, HDL, LDLCALC, TRIG, CHOLHDL, LDLDIRECT in the last 72 hours. Thyroid function studies No results for input(s): TSH, T4TOTAL, T3FREE, THYROIDAB in the last 72 hours.  Invalid input(s): FREET3 Anemia work up Recent Labs    09/14/18 1014  FERRITIN 35  TIBC 281  IRON 19*  RETICCTPCT 2.1   Urinalysis    Component Value Date/Time   COLORURINE YELLOW 08/29/2018 [place] 08/29/2018 0950   LABSPEC 1.014 08/29/2018 0950   PHURINE 5.0 08/29/2018 0950   GLUCOSEU NEGATIVE 08/29/2018 0950   GLUCOSEU NEGATIVE 08/17/2016 1340   HGBUR SMALL (A) 08/29/2018 0950   BILIRUBINUR NEGATIVE 08/29/2018 0950   BILIRUBINUR neg 01/15/2015 1353   KETONESUR NEGATIVE 08/29/2018 0950   PROTEINUR 30 (A) 08/29/2018 0950   UROBILINOGEN 1.0 08/17/2016 1340   NITRITE NEGATIVE 08/29/2018 0950   LEUKOCYTESUR NEGATIVE 08/29/2018 0950   Sepsis Labs Invalid input(s): PROCALCITONIN,  WBC,  LACTICIDVEN Microbiology Recent Results (from the past 240 hour(s))  Novel Coronavirus, NAA (hospital order; send-out to ref lab)     Status: None   Collection Time: 09/15/18  3:36 PM  Result Value Ref Range Status   SARS-CoV-2,  NAA NOT DETECTED NOT  DETECTED Final    Comment: (NOTE) This test was developed and its performance characteristics determined by [employer]. This test has not been FDA cleared or approved. This test has been authorized by FDA under an Emergency Use Authorization (EUA). This test is only authorized for the duration of time the declaration that circumstances exist justifying the authorization of the emergency use of in vitro diagnostic tests for detection of SARS-CoV-2 virus and/or diagnosis of COVID-19 infection under section 564(b)(1) of the Act, 21 U.S.C. 350KXF-8(H)(8), unless the authorization is terminated or revoked sooner. When diagnostic testing is negative, the possibility of a false negative result should be considered in the context of a patient's recent exposures and the presence of clinical signs and symptoms consistent with COVID-19. An individual without symptoms of COVID-19 and who is not shedding SARS-CoV-2 virus would expect to have a negative (not detected) result in this assay. Performed  At: [facility] [place], [place] [id number] Rush Farmer MD VE:[id number]    [place]  Final    Comment: Performed at [facility] [address], [place], [place] [address]     Time coordinating discharge: 50 minutes  SIGNED:   Darliss Cheney, MD  [facility] 09/16/2018, 11:21 AM Pager [phone]  If 7PM-7AM, please contact night-coverage [email or web address] Password [email or web address]

## 2018-09-18 DIAGNOSIS — J441 Chronic obstructive pulmonary disease with (acute) exacerbation: Secondary | ICD-10-CM | POA: Diagnosis not present

## 2018-09-18 DIAGNOSIS — E1165 Type 2 diabetes mellitus with hyperglycemia: Secondary | ICD-10-CM | POA: Diagnosis not present

## 2018-09-18 DIAGNOSIS — G8929 Other chronic pain: Secondary | ICD-10-CM | POA: Diagnosis not present

## 2018-09-18 DIAGNOSIS — R110 Nausea: Secondary | ICD-10-CM | POA: Diagnosis not present

## 2018-09-18 DIAGNOSIS — Z9119 Patient's noncompliance with other medical treatment and regimen: Secondary | ICD-10-CM | POA: Diagnosis not present

## 2018-09-20 ENCOUNTER — Inpatient Hospital Stay (HOSPITAL_COMMUNITY)
Admission: EM | Admit: 2018-09-20 | Discharge: 2018-10-16 | DRG: 208 | Disposition: E | Payer: Medicare Other | Attending: Internal Medicine | Admitting: Internal Medicine

## 2018-09-20 ENCOUNTER — Emergency Department (HOSPITAL_COMMUNITY): Payer: Medicare Other

## 2018-09-20 ENCOUNTER — Other Ambulatory Visit: Payer: Self-pay

## 2018-09-20 DIAGNOSIS — R0602 Shortness of breath: Secondary | ICD-10-CM | POA: Diagnosis not present

## 2018-09-20 DIAGNOSIS — R911 Solitary pulmonary nodule: Secondary | ICD-10-CM | POA: Diagnosis present

## 2018-09-20 DIAGNOSIS — Z825 Family history of asthma and other chronic lower respiratory diseases: Secondary | ICD-10-CM

## 2018-09-20 DIAGNOSIS — Z90710 Acquired absence of both cervix and uterus: Secondary | ICD-10-CM

## 2018-09-20 DIAGNOSIS — R918 Other nonspecific abnormal finding of lung field: Secondary | ICD-10-CM

## 2018-09-20 DIAGNOSIS — J9621 Acute and chronic respiratory failure with hypoxia: Secondary | ICD-10-CM

## 2018-09-20 DIAGNOSIS — J9811 Atelectasis: Secondary | ICD-10-CM | POA: Diagnosis not present

## 2018-09-20 DIAGNOSIS — D631 Anemia in chronic kidney disease: Secondary | ICD-10-CM | POA: Diagnosis present

## 2018-09-20 DIAGNOSIS — I130 Hypertensive heart and chronic kidney disease with heart failure and stage 1 through stage 4 chronic kidney disease, or unspecified chronic kidney disease: Secondary | ICD-10-CM | POA: Diagnosis present

## 2018-09-20 DIAGNOSIS — E875 Hyperkalemia: Secondary | ICD-10-CM

## 2018-09-20 DIAGNOSIS — Z888 Allergy status to other drugs, medicaments and biological substances status: Secondary | ICD-10-CM

## 2018-09-20 DIAGNOSIS — E039 Hypothyroidism, unspecified: Secondary | ICD-10-CM | POA: Diagnosis present

## 2018-09-20 DIAGNOSIS — E1165 Type 2 diabetes mellitus with hyperglycemia: Secondary | ICD-10-CM | POA: Diagnosis not present

## 2018-09-20 DIAGNOSIS — D72819 Decreased white blood cell count, unspecified: Secondary | ICD-10-CM

## 2018-09-20 DIAGNOSIS — Y848 Other medical procedures as the cause of abnormal reaction of the patient, or of later complication, without mention of misadventure at the time of the procedure: Secondary | ICD-10-CM | POA: Diagnosis not present

## 2018-09-20 DIAGNOSIS — I4891 Unspecified atrial fibrillation: Secondary | ICD-10-CM | POA: Diagnosis not present

## 2018-09-20 DIAGNOSIS — E872 Acidosis: Secondary | ICD-10-CM | POA: Diagnosis present

## 2018-09-20 DIAGNOSIS — E7800 Pure hypercholesterolemia, unspecified: Secondary | ICD-10-CM | POA: Diagnosis present

## 2018-09-20 DIAGNOSIS — R161 Splenomegaly, not elsewhere classified: Secondary | ICD-10-CM | POA: Diagnosis present

## 2018-09-20 DIAGNOSIS — C641 Malignant neoplasm of right kidney, except renal pelvis: Secondary | ICD-10-CM | POA: Diagnosis present

## 2018-09-20 DIAGNOSIS — I5033 Acute on chronic diastolic (congestive) heart failure: Secondary | ICD-10-CM | POA: Diagnosis not present

## 2018-09-20 DIAGNOSIS — Z79890 Hormone replacement therapy (postmenopausal): Secondary | ICD-10-CM

## 2018-09-20 DIAGNOSIS — N183 Chronic kidney disease, stage 3 (moderate): Secondary | ICD-10-CM | POA: Diagnosis present

## 2018-09-20 DIAGNOSIS — G894 Chronic pain syndrome: Secondary | ICD-10-CM | POA: Diagnosis present

## 2018-09-20 DIAGNOSIS — R229 Localized swelling, mass and lump, unspecified: Secondary | ICD-10-CM | POA: Diagnosis not present

## 2018-09-20 DIAGNOSIS — I878 Other specified disorders of veins: Secondary | ICD-10-CM | POA: Diagnosis present

## 2018-09-20 DIAGNOSIS — I50810 Right heart failure, unspecified: Secondary | ICD-10-CM | POA: Diagnosis not present

## 2018-09-20 DIAGNOSIS — M797 Fibromyalgia: Secondary | ICD-10-CM | POA: Diagnosis present

## 2018-09-20 DIAGNOSIS — I509 Heart failure, unspecified: Secondary | ICD-10-CM

## 2018-09-20 DIAGNOSIS — J9622 Acute and chronic respiratory failure with hypercapnia: Secondary | ICD-10-CM | POA: Diagnosis present

## 2018-09-20 DIAGNOSIS — G4733 Obstructive sleep apnea (adult) (pediatric): Secondary | ICD-10-CM | POA: Diagnosis not present

## 2018-09-20 DIAGNOSIS — G5600 Carpal tunnel syndrome, unspecified upper limb: Secondary | ICD-10-CM | POA: Diagnosis present

## 2018-09-20 DIAGNOSIS — Z886 Allergy status to analgesic agent status: Secondary | ICD-10-CM

## 2018-09-20 DIAGNOSIS — Z4682 Encounter for fitting and adjustment of non-vascular catheter: Secondary | ICD-10-CM | POA: Diagnosis not present

## 2018-09-20 DIAGNOSIS — D696 Thrombocytopenia, unspecified: Secondary | ICD-10-CM | POA: Diagnosis not present

## 2018-09-20 DIAGNOSIS — E1122 Type 2 diabetes mellitus with diabetic chronic kidney disease: Secondary | ICD-10-CM | POA: Diagnosis present

## 2018-09-20 DIAGNOSIS — E871 Hypo-osmolality and hyponatremia: Secondary | ICD-10-CM | POA: Diagnosis not present

## 2018-09-20 DIAGNOSIS — N179 Acute kidney failure, unspecified: Secondary | ICD-10-CM | POA: Diagnosis not present

## 2018-09-20 DIAGNOSIS — D61818 Other pancytopenia: Secondary | ICD-10-CM | POA: Diagnosis not present

## 2018-09-20 DIAGNOSIS — J95851 Ventilator associated pneumonia: Secondary | ICD-10-CM | POA: Diagnosis not present

## 2018-09-20 DIAGNOSIS — K8689 Other specified diseases of pancreas: Secondary | ICD-10-CM | POA: Diagnosis not present

## 2018-09-20 DIAGNOSIS — Z66 Do not resuscitate: Secondary | ICD-10-CM | POA: Diagnosis not present

## 2018-09-20 DIAGNOSIS — K2270 Barrett's esophagus without dysplasia: Secondary | ICD-10-CM | POA: Diagnosis present

## 2018-09-20 DIAGNOSIS — L89899 Pressure ulcer of other site, unspecified stage: Secondary | ICD-10-CM | POA: Diagnosis present

## 2018-09-20 DIAGNOSIS — N2889 Other specified disorders of kidney and ureter: Secondary | ICD-10-CM | POA: Diagnosis not present

## 2018-09-20 DIAGNOSIS — D72818 Other decreased white blood cell count: Secondary | ICD-10-CM | POA: Diagnosis not present

## 2018-09-20 DIAGNOSIS — R000 Tachycardia, unspecified: Secondary | ICD-10-CM | POA: Diagnosis not present

## 2018-09-20 DIAGNOSIS — Z905 Acquired absence of kidney: Secondary | ICD-10-CM

## 2018-09-20 DIAGNOSIS — Z515 Encounter for palliative care: Secondary | ICD-10-CM | POA: Diagnosis present

## 2018-09-20 DIAGNOSIS — R935 Abnormal findings on diagnostic imaging of other abdominal regions, including retroperitoneum: Secondary | ICD-10-CM | POA: Diagnosis not present

## 2018-09-20 DIAGNOSIS — Z87891 Personal history of nicotine dependence: Secondary | ICD-10-CM

## 2018-09-20 DIAGNOSIS — E785 Hyperlipidemia, unspecified: Secondary | ICD-10-CM | POA: Diagnosis present

## 2018-09-20 DIAGNOSIS — Z85528 Personal history of other malignant neoplasm of kidney: Secondary | ICD-10-CM

## 2018-09-20 DIAGNOSIS — G92 Toxic encephalopathy: Secondary | ICD-10-CM | POA: Diagnosis present

## 2018-09-20 DIAGNOSIS — Z9111 Patient's noncompliance with dietary regimen: Secondary | ICD-10-CM

## 2018-09-20 DIAGNOSIS — E1140 Type 2 diabetes mellitus with diabetic neuropathy, unspecified: Secondary | ICD-10-CM | POA: Diagnosis not present

## 2018-09-20 DIAGNOSIS — Z20828 Contact with and (suspected) exposure to other viral communicable diseases: Secondary | ICD-10-CM | POA: Diagnosis present

## 2018-09-20 DIAGNOSIS — K869 Disease of pancreas, unspecified: Secondary | ICD-10-CM | POA: Diagnosis not present

## 2018-09-20 DIAGNOSIS — Z8249 Family history of ischemic heart disease and other diseases of the circulatory system: Secondary | ICD-10-CM

## 2018-09-20 DIAGNOSIS — E611 Iron deficiency: Secondary | ICD-10-CM | POA: Diagnosis not present

## 2018-09-20 DIAGNOSIS — J449 Chronic obstructive pulmonary disease, unspecified: Secondary | ICD-10-CM | POA: Diagnosis not present

## 2018-09-20 DIAGNOSIS — B1920 Unspecified viral hepatitis C without hepatic coma: Secondary | ICD-10-CM | POA: Diagnosis present

## 2018-09-20 DIAGNOSIS — Z6841 Body Mass Index (BMI) 40.0 and over, adult: Secondary | ICD-10-CM

## 2018-09-20 DIAGNOSIS — J9690 Respiratory failure, unspecified, unspecified whether with hypoxia or hypercapnia: Secondary | ICD-10-CM

## 2018-09-20 DIAGNOSIS — Z978 Presence of other specified devices: Secondary | ICD-10-CM

## 2018-09-20 DIAGNOSIS — Z794 Long term (current) use of insulin: Secondary | ICD-10-CM

## 2018-09-20 DIAGNOSIS — K7460 Unspecified cirrhosis of liver: Secondary | ICD-10-CM | POA: Diagnosis present

## 2018-09-20 DIAGNOSIS — Z95828 Presence of other vascular implants and grafts: Secondary | ICD-10-CM

## 2018-09-20 DIAGNOSIS — I872 Venous insufficiency (chronic) (peripheral): Secondary | ICD-10-CM | POA: Diagnosis present

## 2018-09-20 DIAGNOSIS — C259 Malignant neoplasm of pancreas, unspecified: Secondary | ICD-10-CM | POA: Diagnosis present

## 2018-09-20 DIAGNOSIS — Z9049 Acquired absence of other specified parts of digestive tract: Secondary | ICD-10-CM

## 2018-09-20 DIAGNOSIS — F329 Major depressive disorder, single episode, unspecified: Secondary | ICD-10-CM | POA: Diagnosis present

## 2018-09-20 DIAGNOSIS — M1990 Unspecified osteoarthritis, unspecified site: Secondary | ICD-10-CM | POA: Diagnosis present

## 2018-09-20 DIAGNOSIS — I352 Nonrheumatic aortic (valve) stenosis with insufficiency: Secondary | ICD-10-CM | POA: Diagnosis present

## 2018-09-20 DIAGNOSIS — J9692 Respiratory failure, unspecified with hypercapnia: Secondary | ICD-10-CM | POA: Diagnosis not present

## 2018-09-20 DIAGNOSIS — R0902 Hypoxemia: Secondary | ICD-10-CM | POA: Diagnosis not present

## 2018-09-20 DIAGNOSIS — Z79899 Other long term (current) drug therapy: Secondary | ICD-10-CM

## 2018-09-20 DIAGNOSIS — R001 Bradycardia, unspecified: Secondary | ICD-10-CM | POA: Diagnosis not present

## 2018-09-20 DIAGNOSIS — D509 Iron deficiency anemia, unspecified: Secondary | ICD-10-CM | POA: Diagnosis present

## 2018-09-20 DIAGNOSIS — E1142 Type 2 diabetes mellitus with diabetic polyneuropathy: Secondary | ICD-10-CM | POA: Diagnosis present

## 2018-09-20 DIAGNOSIS — R627 Adult failure to thrive: Secondary | ICD-10-CM | POA: Diagnosis present

## 2018-09-20 DIAGNOSIS — Z7189 Other specified counseling: Secondary | ICD-10-CM | POA: Diagnosis not present

## 2018-09-20 DIAGNOSIS — R0600 Dyspnea, unspecified: Secondary | ICD-10-CM

## 2018-09-20 DIAGNOSIS — I4821 Permanent atrial fibrillation: Secondary | ICD-10-CM | POA: Diagnosis present

## 2018-09-20 DIAGNOSIS — Z9981 Dependence on supplemental oxygen: Secondary | ICD-10-CM

## 2018-09-20 DIAGNOSIS — R188 Other ascites: Secondary | ICD-10-CM | POA: Diagnosis not present

## 2018-09-20 DIAGNOSIS — E876 Hypokalemia: Secondary | ICD-10-CM | POA: Diagnosis not present

## 2018-09-20 DIAGNOSIS — Z452 Encounter for adjustment and management of vascular access device: Secondary | ICD-10-CM | POA: Diagnosis not present

## 2018-09-20 DIAGNOSIS — J811 Chronic pulmonary edema: Secondary | ICD-10-CM | POA: Diagnosis not present

## 2018-09-20 DIAGNOSIS — E662 Morbid (severe) obesity with alveolar hypoventilation: Secondary | ICD-10-CM | POA: Diagnosis present

## 2018-09-20 DIAGNOSIS — I5082 Biventricular heart failure: Secondary | ICD-10-CM | POA: Diagnosis not present

## 2018-09-20 DIAGNOSIS — C642 Malignant neoplasm of left kidney, except renal pelvis: Secondary | ICD-10-CM | POA: Diagnosis not present

## 2018-09-20 DIAGNOSIS — J441 Chronic obstructive pulmonary disease with (acute) exacerbation: Secondary | ICD-10-CM | POA: Diagnosis not present

## 2018-09-20 DIAGNOSIS — G9340 Encephalopathy, unspecified: Secondary | ICD-10-CM | POA: Diagnosis not present

## 2018-09-20 DIAGNOSIS — F40240 Claustrophobia: Secondary | ICD-10-CM | POA: Diagnosis present

## 2018-09-20 DIAGNOSIS — G2581 Restless legs syndrome: Secondary | ICD-10-CM | POA: Diagnosis present

## 2018-09-20 DIAGNOSIS — Z833 Family history of diabetes mellitus: Secondary | ICD-10-CM

## 2018-09-20 DIAGNOSIS — J90 Pleural effusion, not elsewhere classified: Secondary | ICD-10-CM | POA: Diagnosis not present

## 2018-09-20 DIAGNOSIS — J189 Pneumonia, unspecified organism: Secondary | ICD-10-CM | POA: Diagnosis not present

## 2018-09-20 DIAGNOSIS — I10 Essential (primary) hypertension: Secondary | ICD-10-CM | POA: Diagnosis not present

## 2018-09-20 DIAGNOSIS — Z79891 Long term (current) use of opiate analgesic: Secondary | ICD-10-CM

## 2018-09-20 DIAGNOSIS — Z9119 Patient's noncompliance with other medical treatment and regimen: Secondary | ICD-10-CM

## 2018-09-20 DIAGNOSIS — IMO0002 Reserved for concepts with insufficient information to code with codable children: Secondary | ICD-10-CM

## 2018-09-20 DIAGNOSIS — K5900 Constipation, unspecified: Secondary | ICD-10-CM | POA: Diagnosis not present

## 2018-09-20 DIAGNOSIS — Z7951 Long term (current) use of inhaled steroids: Secondary | ICD-10-CM

## 2018-09-20 DIAGNOSIS — T502X5A Adverse effect of carbonic-anhydrase inhibitors, benzothiadiazides and other diuretics, initial encounter: Secondary | ICD-10-CM | POA: Diagnosis not present

## 2018-09-20 DIAGNOSIS — J9620 Acute and chronic respiratory failure, unspecified whether with hypoxia or hypercapnia: Secondary | ICD-10-CM | POA: Diagnosis not present

## 2018-09-20 DIAGNOSIS — I5032 Chronic diastolic (congestive) heart failure: Secondary | ICD-10-CM | POA: Diagnosis not present

## 2018-09-20 DIAGNOSIS — I2781 Cor pulmonale (chronic): Secondary | ICD-10-CM | POA: Diagnosis present

## 2018-09-20 LAB — POCT I-STAT EG7
Acid-Base Excess: 17 mmol/L — ABNORMAL HIGH (ref 0.0–2.0)
Bicarbonate: 46.3 mmol/L — ABNORMAL HIGH (ref 20.0–28.0)
Calcium, Ion: 1.09 mmol/L — ABNORMAL LOW (ref 1.15–1.40)
HCT: 29 % — ABNORMAL LOW (ref 36.0–46.0)
Hemoglobin: 9.9 g/dL — ABNORMAL LOW (ref 12.0–15.0)
O2 Saturation: 45 %
Potassium: 6.9 mmol/L (ref 3.5–5.1)
Sodium: 131 mmol/L — ABNORMAL LOW (ref 135–145)
TCO2: 49 mmol/L — ABNORMAL HIGH (ref 22–32)
pCO2, Ven: 93.9 mmHg (ref 44.0–60.0)
pH, Ven: 7.301 (ref 7.250–7.430)
pO2, Ven: 29 mmHg — CL (ref 32.0–45.0)

## 2018-09-20 LAB — COMPREHENSIVE METABOLIC PANEL
ALT: 59 U/L — ABNORMAL HIGH (ref 0–44)
AST: 38 U/L (ref 15–41)
Albumin: 3.2 g/dL — ABNORMAL LOW (ref 3.5–5.0)
Alkaline Phosphatase: 161 U/L — ABNORMAL HIGH (ref 38–126)
Anion gap: 9 (ref 5–15)
BUN: 66 mg/dL — ABNORMAL HIGH (ref 8–23)
CO2: 41 mmol/L — ABNORMAL HIGH (ref 22–32)
Calcium: 8.9 mg/dL (ref 8.9–10.3)
Chloride: 86 mmol/L — ABNORMAL LOW (ref 98–111)
Creatinine, Ser: 1.6 mg/dL — ABNORMAL HIGH (ref 0.44–1.00)
GFR calc Af Amer: 39 mL/min — ABNORMAL LOW (ref >60)
GFR calc non Af Amer: 33 mL/min — ABNORMAL LOW (ref >60)
Glucose, Bld: 204 mg/dL — ABNORMAL HIGH (ref 70–99)
Potassium: 7.2 mmol/L (ref 3.5–5.1)
Sodium: 136 mmol/L (ref 135–145)
Total Bilirubin: 0.7 mg/dL (ref 0.3–1.2)
Total Protein: 5.9 g/dL — ABNORMAL LOW (ref 6.5–8.1)

## 2018-09-20 LAB — I-STAT CREATININE, ED: Creatinine, Ser: 1.9 mg/dL — ABNORMAL HIGH (ref 0.44–1.00)

## 2018-09-20 LAB — CBC WITH DIFFERENTIAL/PLATELET
Abs Immature Granulocytes: 0.04 10*3/uL (ref 0.00–0.07)
Basophils Absolute: 0 10*3/uL (ref 0.0–0.1)
Basophils Relative: 0 %
Eosinophils Absolute: 0 10*3/uL (ref 0.0–0.5)
Eosinophils Relative: 0 %
HCT: 30.5 % — ABNORMAL LOW (ref 36.0–46.0)
Hemoglobin: 8.1 g/dL — ABNORMAL LOW (ref 12.0–15.0)
Immature Granulocytes: 1 %
Lymphocytes Relative: 6 %
Lymphs Abs: 0.4 10*3/uL — ABNORMAL LOW (ref 0.7–4.0)
MCH: 24.3 pg — ABNORMAL LOW (ref 26.0–34.0)
MCHC: 26.6 g/dL — ABNORMAL LOW (ref 30.0–36.0)
MCV: 91.6 fL (ref 80.0–100.0)
Monocytes Absolute: 0.4 10*3/uL (ref 0.1–1.0)
Monocytes Relative: 6 %
Neutro Abs: 5.4 10*3/uL (ref 1.7–7.7)
Neutrophils Relative %: 87 %
Platelets: 92 10*3/uL — ABNORMAL LOW (ref 150–400)
RBC: 3.33 MIL/uL — ABNORMAL LOW (ref 3.87–5.11)
RDW: 19.3 % — ABNORMAL HIGH (ref 11.5–15.5)
WBC: 6.2 10*3/uL (ref 4.0–10.5)
nRBC: 0 % (ref 0.0–0.2)

## 2018-09-20 LAB — LACTIC ACID, PLASMA: Lactic Acid, Venous: 1.5 mmol/L (ref 0.5–1.9)

## 2018-09-20 LAB — PROTIME-INR
INR: 1.3 — ABNORMAL HIGH (ref 0.8–1.2)
Prothrombin Time: 15.9 seconds — ABNORMAL HIGH (ref 11.4–15.2)

## 2018-09-20 LAB — SARS CORONAVIRUS 2 BY RT PCR (HOSPITAL ORDER, PERFORMED IN ~~LOC~~ HOSPITAL LAB): SARS Coronavirus 2: NEGATIVE

## 2018-09-20 LAB — TROPONIN I: Troponin I: 0.03 ng/mL (ref <0.03)

## 2018-09-20 LAB — BRAIN NATRIURETIC PEPTIDE: B Natriuretic Peptide: 1090.4 pg/mL — ABNORMAL HIGH (ref 0.0–100.0)

## 2018-09-20 MED ORDER — METHYLPREDNISOLONE SODIUM SUCC 125 MG IJ SOLR
125.0000 mg | Freq: Once | INTRAMUSCULAR | Status: AC
  Administered 2018-09-20: 23:00:00 125 mg via INTRAVENOUS
  Filled 2018-09-20: qty 2

## 2018-09-20 MED ORDER — HEPARIN SODIUM (PORCINE) 5000 UNIT/ML IJ SOLN
5000.0000 [IU] | Freq: Three times a day (TID) | INTRAMUSCULAR | Status: DC
  Administered 2018-09-21 – 2018-09-24 (×11): 5000 [IU] via SUBCUTANEOUS
  Filled 2018-09-20 (×11): qty 1

## 2018-09-20 MED ORDER — CALCIUM GLUCONATE-NACL 1-0.675 GM/50ML-% IV SOLN
1.0000 g | Freq: Once | INTRAVENOUS | Status: AC
  Administered 2018-09-21: 1000 mg via INTRAVENOUS
  Filled 2018-09-20: qty 50

## 2018-09-20 MED ORDER — FUROSEMIDE 10 MG/ML IJ SOLN
40.0000 mg | Freq: Once | INTRAMUSCULAR | Status: AC
  Administered 2018-09-20: 40 mg via INTRAVENOUS
  Filled 2018-09-20: qty 4

## 2018-09-20 MED ORDER — SODIUM BICARBONATE 8.4 % IV SOLN
50.0000 meq | Freq: Once | INTRAVENOUS | Status: AC
  Administered 2018-09-21: 50 meq via INTRAVENOUS
  Filled 2018-09-20: qty 50

## 2018-09-20 MED ORDER — DEXTROSE 10 % IV SOLN
Freq: Once | INTRAVENOUS | Status: AC
  Administered 2018-09-21: via INTRAVENOUS

## 2018-09-20 MED ORDER — INSULIN ASPART 100 UNIT/ML IV SOLN
5.0000 [IU] | Freq: Once | INTRAVENOUS | Status: AC
  Administered 2018-09-21: 5 [IU] via INTRAVENOUS

## 2018-09-20 MED ORDER — AEROCHAMBER PLUS FLO-VU LARGE MISC
Status: AC
  Filled 2018-09-20: qty 1

## 2018-09-20 MED ORDER — ALBUTEROL SULFATE HFA 108 (90 BASE) MCG/ACT IN AERS
8.0000 | INHALATION_SPRAY | Freq: Once | RESPIRATORY_TRACT | Status: AC
  Administered 2018-09-20: 8 via RESPIRATORY_TRACT
  Filled 2018-09-20: qty 6.7

## 2018-09-20 MED ORDER — DEXTROSE 50 % IV SOLN
1.0000 | Freq: Once | INTRAVENOUS | Status: AC
  Administered 2018-09-21: 50 mL via INTRAVENOUS
  Filled 2018-09-20: qty 50

## 2018-09-20 NOTE — ED Triage Notes (Signed)
Per EMS pt presents to the ED with Elevated K+ and altered mental status. Pt reports mild shortness of breath. Pt uses 3L nasal cannula at baseline. Pt has moments of confusion but when spoken to is alert and oriented x4.

## 2018-09-20 NOTE — H&P (Addendum)
.  NAME:  Renee Noble, MRN:  [id number], DOB:  10-26-1955, LOS: 0 ADMISSION DATE:  09/16/2018, CONSULTATION DATE:  09/21/2018 REFERRING MD:  Dr. Ralene Bathe, CHIEF COMPLAINT:  AMS  Brief History   63 yoF recently discharged back with acute on chronic hypoxic/ hypercarbic respiratory failure and acute on chronic heart failure.  Now with hyperkalemia, on BiPAP.   History of present illness   Limited HPI obtained from patient as she is currently on BiPAP.  63 year female with extensive history significant for but not limited to chronic hypoxic respiratory failure on 3 L oxygen at home, COPD, OHS, OSA, chronic diastolic CHF, PAF - not on AC given chronic thrombocytopenia/ recent anemia, diabetes type 2, and obesity presenting from rehab/ [facility] for altered mental status and hyperkalemia.   Recent hospitalization 4/13- 5/2 for acute on chronic respiratory failure requiring intubation twice and acute on chronic heart failure with noncompliance in wearing CPAP and fluid restriction; was COVID negative twice.  In ER, she was afebrile, normotensive, tachypneic, with confusion.  Labs noted for VBG PH 7.301, CO2 93.9, PO2 29, HCO2 46, K 7.2, glucose 204, sCr 1.6, Hgb 8.1, normal WBC, platelets 92, BNP 1090,  EKG with afib- rate 87.  She was treated with lasix and placed on BiPAP.  Hyperkalemia was treated with temporizing measures.  PCCM called for admit.    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 rate 19, SpO2 99 %.    FiO2 (%):  [40 %] 40 %  No intake or output data in the 24 hours ending 09/28/2018 2358 There were no vitals filed for this visit.  Examination: General:  Chronically ill morbidly obese, female on BiPAP in NAD HEENT: pupils 3/reactive, full face mask- loose fitting up and lower dentures removed and given to bedside RN, no neck Neuro: Awake, oriented, appropiate, MAE, tremor noted in BUE CV: IRIR, distant HS PULM: even/non-labored on BIPAP 16/6, 40% w/ TVs ~400, lungs bilaterally coarse, diminished in bases GI: soft, upper abd hernia- soft, non-tender, bs active, foley in place Extremities: warm/dry, + 2 LE edema w/ chronic venous stasis changes Skin: no rashes, mild bruising to upper arms, posterior not visualized at this time  Resolved Hospital Problem list    Assessment & Plan:  Acute on chronic hypoxic and hypercapnic respiratory failure - multifactorial due to diastolic HF, pulmonary edema, bilateral effusions, OHS/ OSA, COPD 4 w/FEV1 30% - her baseline CO2 is ~80 and chronically on 3L O2 P:  High risk for intubation, will monitor in ICU for now Remain NPO except meds for now Continue BiPAP prn and mandatory while sleeping Supplemental O2 for sat goal 88-94%  duonebs q6 with brovanna/ pulmicort Diuresis as below  Should qualify for home NIV, will need to set up prior to discharge Caution with chronic sedating meds- she is on MS contin   Acute on chronic HFpEF -  hx of noncompliance with fluid restriction P:  Tele monitoring  Additional lasix 40 mg now, then 40 mg BID Strict I/O's/ daily weights   Hyperkalemia  - s/p calcium gluconate, bicarb, insulin 5 units IV, and 250 ml of D10  P:  Additional insulin IV 5 units now Is getting additional lasix for HF which will additionally help lokelma 5 mg now Recheck BMP at 0400  Mild AKI on CKD 3 - baseline sCr  1.2 P:  Continue foley  Trend BMP / urinary output Avoid nephrotoxic agents, ensure adequate renal perfusion  Paroxysmal Afib - not on AC given prior worsening anemia/ thrombocytopenia P:  Rate currently controlled Hold LA cardizem to avoid hypotension with diuresis Would add prn metoprolol if needed   IDDM type 2 P:  CBG q4 SSI sensitive  levemir 10 units BID  Chronic anemia/ thrombocytopenia P:  Stable, will trend CBC  RUL pulmonary nodule P:  recs for outpatient f/u CT in June/ July 2020  Hx of noncompliance with multiple comorbidities - multiple discussions held on prior admit, PMT involved, patient wishes to remain full code.    Best practice:  Diet: NPO except meds for now, likely can advance diet in am Pain/Anxiety/Delirium protocol (if indicated): n/a VAP protocol (if indicated): n/a DVT prophylaxis: heparin SQ/ SCDs GI prophylaxis: n/a Glucose control: SSI/ levemir Mobility: BR Code Status: full Family Communication: patient updated on plan of care Disposition: ICU  Labs   CBC: Recent Labs  Lab 09/14/18 0345 09/14/18 1419 09/15/18 0401 09/15/18 1629 09/16/18 0432 09/30/2018 2157 10/06/2018 2205  WBC 6.3 6.9 5.1 6.5 5.1 6.2  --   NEUTROABS 5.2 6.2 4.3  --  4.4 5.4  --   HGB 7.1* 7.1* 6.9* 8.1* 7.7* 8.1* 9.9*  HCT 25.9* 25.2* 25.8* 28.2* 27.0* 30.5* 29.0*  MCV 86.6 87.5 87.2 88.1 89.1 91.6  --   PLT 52* 51* 43* 43* 37* 92*  --     Basic Metabolic Panel: Recent Labs  Lab 09/14/18 0345 09/15/18 0401 09/16/18 0432 09/30/2018 2157 10/06/2018 2205  NA 140 137 138 136 131*  K 3.4* 4.0 4.0 7.2* 6.9*  CL 81* 82* 84* 86*  --   CO2 49* 46* 44* 41*  --   GLUCOSE 120* 151* 117* 204*  --   BUN 46* 46* 45* 66*  --   CREATININE 1.07* 1.15* 1.08* 1.60* 1.90*  CALCIUM 8.4* 8.3* 8.6* 8.9  --    GFR: Estimated Creatinine Clearance: 39 mL/min (A) (by C-G formula based on SCr of 1.9 mg/dL (H)). Recent Labs  Lab 09/15/18 0401 09/15/18 1629 09/16/18 0432  10/06/2018 2157  WBC 5.1 6.5 5.1 6.2  LATICACIDVEN  --   --   --  1.5    Liver Function Tests: Recent Labs  Lab 09/14/18 0345 09/15/18 0401 10/02/2018 2157  AST 20 20 38  ALT 14 16 59*  ALKPHOS 57 68 161*  BILITOT 0.5 0.8 0.7  PROT 4.9* 5.3* 5.9*  ALBUMIN 2.9* 3.1* 3.2*   No results for input(s): LIPASE, AMYLASE in the last 168 hours. No results for input(s): AMMONIA in the last 168 hours.  ABG    Component Value Date/Time   PHART 7.474 (H) 09/13/2018 1236   PCO2ART 74.9 (HH) 09/13/2018 1236   PO2ART 34.8 (LL) 09/13/2018 1236   HCO3 46.3 (H) 10/10/2018 2205   TCO2 49 (H) 09/28/2018 2205   O2SAT 45.0 10/06/2018 2205     Coagulation Profile: Recent Labs  Lab 09/23/2018 2157  INR 1.3*    Cardiac Enzymes: Recent Labs  Lab 09/29/2018 2157  TROPONINI 0.03*    HbA1C: Hgb A1c MFr Bld  Date/Time Value Ref Range Status  06/12/2018 04:10 AM 5.6 4.8 - 5.6 % Final    Comment:    (NOTE) Pre diabetes:          5.7%-6.4% Diabetes:              >6.4% Glycemic control for   <7.0% adults with diabetes   04/09/2018 05:39 AM 5.2 4.8 - 5.6 % Final    Comment:    (NOTE) Pre diabetes:          5.7%-6.4% Diabetes:              >6.4% Glycemic control for   <7.0% adults with diabetes     CBG: Recent Labs  Lab 09/15/18 2031 09/16/18 0004 09/16/18 0503 09/16/18 0759 09/16/18 1143  GLUCAP 173* 137* 122* 118* 225*    Review of Systems:   Limited ROS as patient is on BiPAP  Past Medical History  She,  has a past medical history of Abdominal mass of other site, Cervical compression fracture (HCC), CHF (congestive heart failure) ([place]), Chronic kidney disease, stage 3 (HCC), Chronic lower limb pain, COPD (chronic obstructive pulmonary disease) ([place]), CTS (carpal tunnel syndrome), Depression, Diabetes ([place]), Fibromyalgia, Hepatitis C, Hypercholesteremia, Hypertension, Hypothyroidism, Morbid obesity ([place]), Neuropathy, OSA (obstructive sleep apnea), Osteoarthritis, Renal cancer  ([place]), RLS (restless legs syndrome), Syncope, and Venous stasis.   Surgical History    Past Surgical History:  Procedure Laterality Date  . ABDOMINAL HERNIA REPAIR     x2  . ABDOMINAL HYSTERECTOMY    . BLADDER SUSPENSION    . CHOLECYSTECTOMY    . CYST EXCISION     Head  . INCISIONAL HERNIA REPAIR    . KNEE ARTHROSCOPY     Bilateral  . NEPHRECTOMY     Left  . TONSILLECTOMY    . TOOTH EXTRACTION    . UMBILICAL HERNIA REPAIR    . VAGINA SURGERY    . WISDOM TOOTH EXTRACTION       Social History   reports that she quit smoking about 2 years ago. Her smoking use included cigarettes. She has a 112.50 pack-year smoking history. She has never used smokeless tobacco. She reports that she does not drink alcohol or use drugs.   Family History   Her family history includes Alcoholism in her father, mother, and paternal grandfather; Brain cancer in her maternal aunt; Breast cancer in her maternal aunt; COPD (age of onset: 58) in her father; Cancer in her sister; Diabetes in her maternal grandmother and sister; Emphysema in her father, maternal grandfather, and mother; Esophageal varices in her father; Heart attack (age of onset: 39) in her mother; Heart defect in her sister and sister; Heart disease in her maternal grandmother and mother; Lung cancer in her maternal grandfather; Obesity in her son. There is no history of Colon cancer or Esophageal cancer.   Allergies Allergies  Allergen Reactions  . Gabapentin Anaphylaxis  . Lyrica [Pregabalin] Shortness Of Breath    Trouble breathing  . Ketorolac Tromethamine Hives  . Lisinopril Cough     Home Medications  Prior to Admission medications   Medication Sig Start Date End Date Taking? Authorizing Provider  albuterol (PROAIR HFA) 108 (90 Base) MCG/ACT inhaler Inhale 1-2 puffs into the lungs every 6 (six) hours as needed for wheezing or shortness of breath. 01/20/18   Christinia Gully  B, MD  arformoterol (BROVANA) 15 MCG/2ML NEBU Take 2 mLs (15  mcg total) by nebulization 2 (two) times daily. 09/16/18   Darliss Cheney, MD  budesonide-formoterol (SYMBICORT) 160-4.5 MCG/ACT inhaler Inhale 2 puffs into the lungs 2 (two) times daily. 06/29/18   Tanda Rockers, MD  clotrimazole-betamethasone (LOTRISONE) cream Apply 1 application topically 2 (two) times daily as needed. Patient taking differently: Apply 1 application topically 2 (two) times daily as needed (yeast).  05/02/18   [name], PA-C  dextromethorphan-guaiFENesin [facility] DM) 30-600 MG 12hr tablet Take 1 tablet by mouth 2 (two) times daily as needed for cough. Patient taking differently: Take 1 tablet by mouth 2 (two) times daily.  06/15/18   Hosie Poisson, MD  diltiazem (CARDIZEM CD) 180 MG 24 hr capsule Take 1 capsule (180 mg total) by mouth daily. 06/02/18   [name], PA-C  furosemide (LASIX) 40 MG tablet Take 1 tablet (40 mg total) by mouth 2 (two) times daily. 09/16/18   Darliss Cheney, MD  glucose blood (ACCU-CHEK AVIVA) test strip Check blood sugars 4 times per day for diabetes. Dx:E11.29 07/24/18   [name], PA-C  insulin detemir (LEVEMIR) 100 UNIT/ML injection Inject 0.1 mLs (10 Units total) into the skin 2 (two) times daily. 09/16/18   Darliss Cheney, MD  insulin lispro (HUMALOG) 100 UNIT/ML injection Inject 0.15 mLs (15 Units total) into the skin 3 (three) times daily as needed for high blood sugar. Uses sliding scale. Sugar over 200 will give insulin. 07/14/18   [name], PA-C  ipratropium-albuterol (DUONEB) 0.5-2.5 (3) MG/3ML SOLN Take 3 mLs by nebulization 4 (four) times daily. Dx: J44.9 Patient taking differently: Take 3 mLs by nebulization every 6 (six) hours as needed (shortness of breath).  03/21/18   Parrett, Fonnie Mu, NP  levothyroxine (SYNTHROID, LEVOTHROID) 88 MCG tablet TAKE ONE TABLET BY MOUTH DAILY Patient taking differently: Take 88 mcg by mouth every evening.  07/20/18   [name], PA-C  morphine (MS CONTIN) 30 MG 12 hr tablet Take 1  tablet (30 mg total) by mouth every 12 (twelve) hours. 09/16/18   Darliss Cheney, MD  Multiple Vitamins-Minerals (CENTRUM SILVER PO) Take by mouth.    [provider]  nortriptyline (PAMELOR) 25 MG capsule Take 1 capsule (25 mg total) by mouth at bedtime. 06/21/18   [name], PA-C  nystatin (MYCOSTATIN/NYSTOP) powder APPLY TOPICALLY TWICE DAILY AS NEEDED FOR YEAST INFECTION Patient taking differently: Apply 1 g topically 2 (two) times daily as needed (yeast).  09/27/17   [name], PA-C  OXYGEN Inhale 3.5 L into the lungs continuous. O2 3.5L at home.    [provider]  potassium chloride SA (K-DUR) 20 MEQ tablet Take 2 tablets (40 mEq total) by mouth daily. 09/16/18   Darliss Cheney, MD  pravastatin (PRAVACHOL) 20 MG tablet TAKE ONE TABLET BY MOUTH EVERY DAY Patient taking differently: Take 20 mg by mouth every evening.  05/05/18   [name], PA-C     Critical care time: 40 mins    Kennieth Rad, MSN, AGACNP-BC [facility] Pgr: [phone] or if no answer [phone] 09/21/2018, 1:35 AM     Attending MD note  Patient was independently seen and examined, treatment plan was discussed with the  Advance Practice Provider. I agree with the above note by NP Kennieth Rad. I have personally reviewed the clinical findings, labs, ECG, imaging studies and management of this patient in detail. I have  also reviewed the orders written for this patient which were under my direction. I agree with the documentation, as recorded by the Advance Practice Provider.   Briefly, Renee Noble is a 63 y.o. female with hx of diastolic CHF, COPD, here with hypercarbic hypoxemic respiratory 2/2 CHF exacerbation.    Subjective: Feels short of breath.   Objective: Vitals:   09/21/18 0413 09/21/18 0500  BP: (!) 115/51 (!) 105/49  Pulse: 80 (!) 138  Resp: 16 13  Temp:    SpO2:  100%    FiO2 (%):  [30 %] 30 % No intake or output data in the 24 hours  ending 03/15/18 2358   General:  NAD, on bipap, talking.  Neuro:  Follows commands, Aan O x 3 HEENT:  [place]/AT, No JVD noted, PERRL Cardiovascular:  RRR, no MRG Lungs:  Diminished BS B.  Abdomen:  Soft, non-distended Musculoskeletal:  No acute deformity Skin:  Intact, MMM  CXR images B edema  Impression/Plan: 63 year old woman with chf, COPD.  Elevated CO2, appears near baseline.  Hypoxemia, responding well to bipap.   Diuresis.  No wheezing/evidence of COPD exacerbation.  Hyperkalemia - in the setting of recent K supplementation while on lasix.  Unable to assess exactly which home meds she has been taking.  AKI present but hyperkalemia out of proportion to renal dysfunction. Received temporizing measures for hyperkalemia, Lokelma  Pending.   My cc time 45  minutes  This patient is critically ill, requiring high complexity decision making for assessment and plan, frequent evaluation, application of advanced monitoring and extensive interpretations of multiple databases.     Collier Bullock, MD

## 2018-09-20 NOTE — ED Provider Notes (Signed)
[facility]ERGENCY DEPARTMENT Provider Note   CSN: [id number] Arrival date & time: 10/04/2018  2144    History   Chief Complaint Chief Complaint  Patient presents with  . Altered Mental Status    HPI Renee Noble is a 63 y.o. female.     The history is provided by the patient, the EMS personnel and medical records. No language interpreter was used.  Altered Mental Status   Renee Noble is a 63 y.o. female who presents to the Emergency Department complaining of abnormal lab and altered mental status. Level five caveat due to altered mental status. History is provided by EMS in the medical record. EMS reports that she was brought into the emergency department today for hyperkalemia. Per nursing home report she had hyperkalemia with a potassium of 6.5 and was ordered kayexelate. It is not clear from the [facility] if she received it. Patient is confused and and able to provide additional history. She does complain of mild shortness of breath.   Past Medical History:  Diagnosis Date  . Abdominal mass of other site   . Cervical compression fracture ([place])   . CHF (congestive heart failure) ([place])   . Chronic kidney disease, stage 3 (HCC)    Borderline Stage 2-3  . Chronic lower limb pain   . COPD (chronic obstructive pulmonary disease) ([place])   . CTS (carpal tunnel syndrome)   . Depression   . Diabetes ([place])    Type II  . Fibromyalgia   . Hepatitis C    cured last year (2018)  . Hypercholesteremia   . Hypertension   . Hypothyroidism   . Morbid obesity ([place])   . Neuropathy    Diabetes  . OSA (obstructive sleep apnea)   . Osteoarthritis   . Renal cancer ([place])    Left Kidney Removed  . RLS (restless legs syndrome)   . Syncope   . Venous stasis     Patient Active Problem List   Diagnosis Date Noted  . Palliative care encounter   . Endotracheal tube present   . Pressure ulcer 08/30/2018  . Acute exacerbation of chronic obstructive pulmonary disease (COPD)  ([place]) 08/28/2018  . Atrial flutter with rapid ventricular response ([place]) 08/28/2018  . AKI (acute kidney injury) ([place]) 08/28/2018  . Wound of thigh 08/15/2018  . Pressure injury of skin 07/17/2018  . Hypoglycemia due to insulin 07/14/2018  . Acute on chronic respiratory failure with hypercapnia ([place]) 06/09/2018  . HLD (hyperlipidemia) 06/09/2018  . Hypothyroidism 06/09/2018  . Depression 06/09/2018  . Fibromyalgia   . Lung nodule 05/08/2018  . Acute on chronic congestive heart failure ([place])   . Morbid obesity with BMI of 50.0-59.9, adult ([place]) 04/10/2018  . Chronic pain 04/07/2018  . Acute on chronic diastolic CHF (congestive heart failure) ([place]) 04/07/2018  . PNA (pneumonia) 03/21/2018  . Chronic obstructive pulmonary disease ([place]) 02/25/2018  . Chronic pain syndrome   . Normocytic anemia 02/24/2018  . OSA (obstructive sleep apnea) 08/16/2017  . Morbid obesity due to excess calories ([place]) 04/29/2017  . Cor pulmonale (chronic) ([place]) 04/29/2017  . Chronic respiratory failure with hypoxia and hypercapnia ([place]) 04/28/2017  . CKD (chronic kidney disease), stage III ([place]) 04/14/2016  . Hyperlipidemia 01/16/2016  . Essential hypertension 01/16/2016  . Hypoxia   . Thrombocytopenia ([place]) 07/12/2015  . Thyroid activity decreased 06/23/2015  . Encounter for orogastric (OG) tube placement 09/02/2014  . Hernia of abdominal cavity 07/30/2014  . Abnormal CXR 02/15/2014  .  COPD GOLDIII with min reversibility  12/16/2013  . History of hepatitis C 12/16/2013  . Breast cancer screening 12/16/2013  . Type II diabetes mellitus with renal manifestations ([place]) 12/16/2013  . Screening for osteoporosis 12/16/2013    Past Surgical History:  Procedure Laterality Date  . ABDOMINAL HERNIA REPAIR     x2  . ABDOMINAL HYSTERECTOMY    . BLADDER SUSPENSION    . CHOLECYSTECTOMY    . CYST EXCISION     Head  . INCISIONAL HERNIA REPAIR    . KNEE ARTHROSCOPY     Bilateral  . NEPHRECTOMY     Left  .  TONSILLECTOMY    . TOOTH EXTRACTION    . UMBILICAL HERNIA REPAIR    . VAGINA SURGERY    . WISDOM TOOTH EXTRACTION       OB History    Gravida  8   Para  1   Term  1   Preterm      AB  7   Living        SAB  7   TAB      Ectopic      Multiple      Live Births               Home Medications    Prior to Admission medications   Medication Sig Start Date End Date Taking? Authorizing Provider  albuterol (PROAIR HFA) 108 (90 Base) MCG/ACT inhaler Inhale 1-2 puffs into the lungs every 6 (six) hours as needed for wheezing or shortness of breath. 01/20/18   Tanda Rockers, MD  arformoterol (BROVANA) 15 MCG/2ML NEBU Take 2 mLs (15 mcg total) by nebulization 2 (two) times daily. 09/16/18   Darliss Cheney, MD  budesonide-formoterol (SYMBICORT) 160-4.5 MCG/ACT inhaler Inhale 2 puffs into the lungs 2 (two) times daily. 06/29/18   Tanda Rockers, MD  clotrimazole-betamethasone (LOTRISONE) cream Apply 1 application topically 2 (two) times daily as needed. Patient taking differently: Apply 1 application topically 2 (two) times daily as needed (yeast).  05/02/18   [name], PA-C  dextromethorphan-guaiFENesin [facility] DM) 30-600 MG 12hr tablet Take 1 tablet by mouth 2 (two) times daily as needed for cough. Patient taking differently: Take 1 tablet by mouth 2 (two) times daily.  06/15/18   Hosie Poisson, MD  diltiazem (CARDIZEM CD) 180 MG 24 hr capsule Take 1 capsule (180 mg total) by mouth daily. 06/02/18   [name], PA-C  furosemide (LASIX) 40 MG tablet Take 1 tablet (40 mg total) by mouth 2 (two) times daily. 09/16/18   Darliss Cheney, MD  glucose blood (ACCU-CHEK AVIVA) test strip Check blood sugars 4 times per day for diabetes. Dx:E11.29 07/24/18   [name], PA-C  insulin detemir (LEVEMIR) 100 UNIT/ML injection Inject 0.1 mLs (10 Units total) into the skin 2 (two) times daily. 09/16/18   Darliss Cheney, MD  insulin lispro (HUMALOG) 100 UNIT/ML injection Inject 0.15  mLs (15 Units total) into the skin 3 (three) times daily as needed for high blood sugar. Uses sliding scale. Sugar over 200 will give insulin. 07/14/18   [name], PA-C  ipratropium-albuterol (DUONEB) 0.5-2.5 (3) MG/3ML SOLN Take 3 mLs by nebulization 4 (four) times daily. Dx: J44.9 Patient taking differently: Take 3 mLs by nebulization every 6 (six) hours as needed (shortness of breath).  03/21/18   Parrett, Fonnie Mu, NP  levothyroxine (SYNTHROID, LEVOTHROID) 88 MCG tablet TAKE ONE TABLET BY MOUTH DAILY Patient taking differently: Take  88 mcg by mouth every evening.  07/20/18   [name], PA-C  morphine (MS CONTIN) 30 MG 12 hr tablet Take 1 tablet (30 mg total) by mouth every 12 (twelve) hours. 09/16/18   Darliss Cheney, MD  Multiple Vitamins-Minerals (CENTRUM SILVER PO) Take by mouth.    [provider]  nortriptyline (PAMELOR) 25 MG capsule Take 1 capsule (25 mg total) by mouth at bedtime. 06/21/18   [name], PA-C  nystatin (MYCOSTATIN/NYSTOP) powder APPLY TOPICALLY TWICE DAILY AS NEEDED FOR YEAST INFECTION Patient taking differently: Apply 1 g topically 2 (two) times daily as needed (yeast).  09/27/17   [name], PA-C  OXYGEN Inhale 3.5 L into the lungs continuous. O2 3.5L at home.    [provider]  potassium chloride SA (K-DUR) 20 MEQ tablet Take 2 tablets (40 mEq total) by mouth daily. 09/16/18   Darliss Cheney, MD  pravastatin (PRAVACHOL) 20 MG tablet TAKE ONE TABLET BY MOUTH EVERY DAY Patient taking differently: Take 20 mg by mouth every evening.  05/05/18   [name], PA-C    Family History Family History  Problem Relation Age of Onset  . Heart attack Mother 14       Deceased  . Heart disease Mother   . Emphysema Mother   . Alcoholism Mother   . COPD Father 88       Deceased  . Emphysema Father   . Alcoholism Father   . Esophageal varices Father   . Alcoholism Paternal Grandfather   . Diabetes Maternal Grandmother   . Heart  disease Maternal Grandmother   . Lung cancer Maternal Grandfather   . Emphysema Maternal Grandfather   . Brain cancer Maternal Aunt   . Diabetes Sister   . Heart defect Sister   . Cancer Sister        was told her sister had cancer but beat it and dont know which one   . Heart defect Sister   . Obesity Son   . Breast cancer Maternal Aunt   . Colon cancer Neg Hx   . Esophageal cancer Neg Hx     Social History Social History   Tobacco Use  . Smoking status: Former Smoker    Packs/day: 2.50    Years: 45.00    Pack years: 112.50    Types: Cigarettes    Last attempt to quit: 07/21/2016    Years since quitting: 2.1  . Smokeless tobacco: Never Used  Substance Use Topics  . Alcohol use: No  . Drug use: No     Allergies   Gabapentin; Lyrica [pregabalin]; Ketorolac tromethamine; and Lisinopril   Review of Systems Review of Systems  All other systems reviewed and are negative.    Physical Exam Updated Vital Signs BP 98/82   Pulse 87   Temp 98.5 F (36.9 C) (Oral)   Resp 19   SpO2 99%   Physical Exam Vitals signs and nursing note reviewed.  Constitutional:      Appearance: She is well-developed.  HENT:     Head: Normocephalic and atraumatic.  Cardiovascular:     Comments: Irregular rhythm Pulmonary:     Effort: Pulmonary effort is normal. No respiratory distress.     Comments: Course breath sounds bilaterally Abdominal:     Palpations: Abdomen is soft.     Tenderness: There is no abdominal tenderness. There is no guarding or rebound.     Comments: Obese abdomen  Musculoskeletal:  General: Swelling present. No tenderness.  Skin:    General: Skin is warm and dry.  Neurological:     Mental Status: She is alert.     Comments: Oriented to hospital. Disoriented to time in recent events. There is a fine tremor of bilateral upper extremities that is intermittently present. Mildly confused. Generalized weakness.  Psychiatric:        Behavior: Behavior normal.       ED Treatments / Results  Labs (all labs ordered are listed, but only abnormal results are displayed) Labs Reviewed  COMPREHENSIVE METABOLIC PANEL - Abnormal; Notable for the following components:      Result Value   Potassium 7.2 (*)    Chloride 86 (*)    CO2 41 (*)    Glucose, Bld 204 (*)    BUN 66 (*)    Creatinine, Ser 1.60 (*)    Total Protein 5.9 (*)    Albumin 3.2 (*)    ALT 59 (*)    Alkaline Phosphatase 161 (*)    GFR calc non Af Amer 33 (*)    GFR calc Af Amer 39 (*)    All other components within normal limits  TROPONIN I - Abnormal; Notable for the following components:   Troponin I 0.03 (*)    All other components within normal limits  CBC WITH DIFFERENTIAL/PLATELET - Abnormal; Notable for the following components:   RBC 3.33 (*)    Hemoglobin 8.1 (*)    HCT 30.5 (*)    MCH 24.3 (*)    MCHC 26.6 (*)    RDW 19.3 (*)    Platelets 92 (*)    Lymphs Abs 0.4 (*)    All other components within normal limits  BRAIN NATRIURETIC PEPTIDE - Abnormal; Notable for the following components:   B Natriuretic Peptide 1,090.4 (*)    All other components within normal limits  PROTIME-INR - Abnormal; Notable for the following components:   Prothrombin Time 15.9 (*)    INR 1.3 (*)    All other components within normal limits  I-STAT CREATININE, ED - Abnormal; Notable for the following components:   Creatinine, Ser 1.90 (*)    All other components within normal limits  POCT I-STAT EG7 - Abnormal; Notable for the following components:   pCO2, Ven 93.9 (*)    pO2, Ven 29.0 (*)    Bicarbonate 46.3 (*)    TCO2 49 (*)    Acid-Base Excess 17.0 (*)    Sodium 131 (*)    Potassium 6.9 (*)    Calcium, Ion 1.09 (*)    HCT 29.0 (*)    Hemoglobin 9.9 (*)    All other components within normal limits  SARS CORONAVIRUS 2 (HOSPITAL ORDER, [place] [facility])  CULTURE, BLOOD (ROUTINE X 2)  CULTURE, BLOOD (ROUTINE X 2)  LACTIC ACID, PLASMA  LACTIC ACID, PLASMA   GLUCOSE, RANDOM  I-STAT ARTERIAL BLOOD GAS, ED  CBG MONITORING, ED    EKG EKG Interpretation  Date/Time:  Wednesday Sep 20 2018 21:50:59 EDT Ventricular Rate:  87 PR Interval:    QRS Duration: 123 QT Interval:  344 QTC Calculation: 414 R Axis:   -65 Text Interpretation:  Atrial fibrillation Ventricular premature complex Right bundle branch block Anterolateral infarct, age indeterminate Confirmed by Quintella Reichert [phone]) on 09/19/2018 9:58:00 PM   Radiology Dg Chest Port 1 View  Result Date: 09/25/2018 CLINICAL DATA:  Shortness of breath EXAM: PORTABLE CHEST 1 VIEW COMPARISON:  09/11/2018 FINDINGS:  Cardiomegaly with vascular congestion and bilateral airspace opacities concerning for edema/CHF. Layering bilateral effusions. No acute bony abnormality. IMPRESSION: Moderate CHF.  Layering bilateral effusions. Electronically Signed   By: Rolm Baptise M.D.   On: 10/06/2018 22:38    Procedures Procedures (including critical care time) CRITICAL CARE Performed by: Quintella Reichert   Total critical care time: 40 minutes  Critical care time was exclusive of separately billable procedures and treating other patients.  Critical care was necessary to treat or prevent imminent or life-threatening deterioration.  Critical care was time spent personally by me on the following activities: development of treatment plan with patient and/or surrogate as well as nursing, discussions with consultants, evaluation of patient's response to treatment, examination of patient, obtaining history from patient or surrogate, ordering and performing treatments and interventions, ordering and review of laboratory studies, ordering and review of radiographic studies, pulse oximetry and re-evaluation of patient's condition.  Medications Ordered in ED Medications  AeroChamber Plus Flo-Vu Large MISC (has no administration in time range)  calcium gluconate 1 g/ 50 mL sodium chloride IVPB (has no administration in  time range)  insulin aspart (novoLOG) injection 5 Units (has no administration in time range)    And  dextrose 50 % solution 50 mL (has no administration in time range)  dextrose 10 % infusion (has no administration in time range)  sodium bicarbonate injection 50 mEq (has no administration in time range)  albuterol (VENTOLIN HFA) 108 (90 Base) MCG/ACT inhaler 8 puff (8 puffs Inhalation Given 09/17/2018 2309)  furosemide (LASIX) injection 40 mg (40 mg Intravenous Given 09/19/2018 2258)  methylPREDNISolone sodium succinate (SOLU-MEDROL) 125 mg/2 mL injection 125 mg (125 mg Intravenous Given 10/05/2018 2258)     Initial Impression / Assessment and Plan / ED Course  I have reviewed the triage vital signs and the nursing notes.  Pertinent labs & imaging results that were available during my care of the patient were reviewed by me and considered in my medical decision making (see chart for details).        Patient with history of chronic respiratory failure on home oxygen at 3 L here for evaluation of hyperkalemia. She is ill appearing on evaluation with confusion but is maintaining her airway. She was treated with albuterol, Solu-Medrol, Lasix for combination of COPD exacerbation as well as CHF exacerbation. Given her recent hospitalization COVID test was sent, which was negative. Plan to start on BiPAP for respiratory support (pt on CPAP at night).  On record review she was recently hospitalized and required intubation x2 for declining bipap.  Pt is agreeable to bipap at this time.  EKG with afib, no acute ischemic changes.  She was treated with temporizing measures for her hyperkalemia.  Critical care consulted for admission.  Pt updated of findings of studies and recommendation for admission and she is in agreement with treatment plan.    Final Clinical Impressions(s) / ED Diagnoses   Final diagnoses:  Acute on chronic respiratory failure with hypoxia and hypercapnia (HCC)  Hyperkalemia  AKI (acute  kidney injury) [facility])    ED Discharge Orders    None       Quintella Reichert, MD 09/16/2018 2345

## 2018-09-21 ENCOUNTER — Inpatient Hospital Stay (HOSPITAL_COMMUNITY): Payer: Medicare Other

## 2018-09-21 DIAGNOSIS — J9622 Acute and chronic respiratory failure with hypercapnia: Secondary | ICD-10-CM

## 2018-09-21 DIAGNOSIS — J9620 Acute and chronic respiratory failure, unspecified whether with hypoxia or hypercapnia: Secondary | ICD-10-CM

## 2018-09-21 DIAGNOSIS — E875 Hyperkalemia: Secondary | ICD-10-CM

## 2018-09-21 DIAGNOSIS — N179 Acute kidney failure, unspecified: Secondary | ICD-10-CM

## 2018-09-21 LAB — GLUCOSE, CAPILLARY
Glucose-Capillary: 186 mg/dL — ABNORMAL HIGH (ref 70–99)
Glucose-Capillary: 200 mg/dL — ABNORMAL HIGH (ref 70–99)
Glucose-Capillary: 200 mg/dL — ABNORMAL HIGH (ref 70–99)
Glucose-Capillary: 215 mg/dL — ABNORMAL HIGH (ref 70–99)
Glucose-Capillary: 218 mg/dL — ABNORMAL HIGH (ref 70–99)
Glucose-Capillary: 221 mg/dL — ABNORMAL HIGH (ref 70–99)
Glucose-Capillary: 227 mg/dL — ABNORMAL HIGH (ref 70–99)

## 2018-09-21 LAB — BLOOD GAS, ARTERIAL
Acid-Base Excess: 13.7 mmol/L — ABNORMAL HIGH (ref 0.0–2.0)
Acid-Base Excess: 13.4 mmol/L — ABNORMAL HIGH (ref 0.0–2.0)
Acid-Base Excess: 16.6 mmol/L — ABNORMAL HIGH (ref 0.0–2.0)
Bicarbonate: 41.1 mmol/L — ABNORMAL HIGH (ref 20.0–28.0)
Bicarbonate: 40.4 mmol/L — ABNORMAL HIGH (ref 20.0–28.0)
Bicarbonate: 43.7 mmol/L — ABNORMAL HIGH (ref 20.0–28.0)
Delivery systems: POSITIVE
Delivery systems: POSITIVE
Delivery systems: POSITIVE
Drawn by: 347191
Drawn by: 347191
Drawn by: 347191
Expiratory PAP: 8
Expiratory PAP: 6
Expiratory PAP: 5
FIO2: 40
FIO2: 60
FIO2: 50
Inspiratory PAP: 16
Inspiratory PAP: 20
Inspiratory PAP: 20
O2 Saturation: 92.4 %
O2 Saturation: 99.2 %
O2 Saturation: 97.9 %
Patient temperature: 98.6
Patient temperature: 98.6
Patient temperature: 98.2
RATE: 12 resp/min
pCO2 arterial: 96.6 mmHg (ref 32.0–48.0)
pCO2 arterial: 87.4 mmHg (ref 32.0–48.0)
pCO2 arterial: 91 mmHg (ref 32.0–48.0)
pH, Arterial: 7.253 — ABNORMAL LOW (ref 7.350–7.450)
pH, Arterial: 7.287 — ABNORMAL LOW (ref 7.350–7.450)
pH, Arterial: 7.301 — ABNORMAL LOW (ref 7.350–7.450)
pO2, Arterial: 70.3 mmHg — ABNORMAL LOW (ref 83.0–108.0)
pO2, Arterial: 136 mmHg — ABNORMAL HIGH (ref 83.0–108.0)
pO2, Arterial: 101 mmHg (ref 83.0–108.0)

## 2018-09-21 LAB — BASIC METABOLIC PANEL
Anion gap: 13 (ref 5–15)
Anion gap: 11 (ref 5–15)
Anion gap: 13 (ref 5–15)
Anion gap: 9 (ref 5–15)
Anion gap: 8 (ref 5–15)
BUN: 71 mg/dL — ABNORMAL HIGH (ref 8–23)
BUN: 71 mg/dL — ABNORMAL HIGH (ref 8–23)
BUN: 74 mg/dL — ABNORMAL HIGH (ref 8–23)
BUN: 76 mg/dL — ABNORMAL HIGH (ref 8–23)
BUN: 76 mg/dL — ABNORMAL HIGH (ref 8–23)
CO2: 39 mmol/L — ABNORMAL HIGH (ref 22–32)
CO2: 41 mmol/L — ABNORMAL HIGH (ref 22–32)
CO2: 40 mmol/L — ABNORMAL HIGH (ref 22–32)
CO2: 43 mmol/L — ABNORMAL HIGH (ref 22–32)
CO2: 42 mmol/L — ABNORMAL HIGH (ref 22–32)
Calcium: 8.8 mg/dL — ABNORMAL LOW (ref 8.9–10.3)
Calcium: 8.6 mg/dL — ABNORMAL LOW (ref 8.9–10.3)
Calcium: 9 mg/dL (ref 8.9–10.3)
Calcium: 9.2 mg/dL (ref 8.9–10.3)
Calcium: 8.9 mg/dL (ref 8.9–10.3)
Chloride: 84 mmol/L — ABNORMAL LOW (ref 98–111)
Chloride: 82 mmol/L — ABNORMAL LOW (ref 98–111)
Chloride: 83 mmol/L — ABNORMAL LOW (ref 98–111)
Chloride: 85 mmol/L — ABNORMAL LOW (ref 98–111)
Chloride: 87 mmol/L — ABNORMAL LOW (ref 98–111)
Creatinine, Ser: 1.55 mg/dL — ABNORMAL HIGH (ref 0.44–1.00)
Creatinine, Ser: 1.67 mg/dL — ABNORMAL HIGH (ref 0.44–1.00)
Creatinine, Ser: 1.67 mg/dL — ABNORMAL HIGH (ref 0.44–1.00)
Creatinine, Ser: 1.58 mg/dL — ABNORMAL HIGH (ref 0.44–1.00)
Creatinine, Ser: 1.47 mg/dL — ABNORMAL HIGH (ref 0.44–1.00)
GFR calc Af Amer: 40 mL/min — ABNORMAL LOW (ref >60)
GFR calc Af Amer: 37 mL/min — ABNORMAL LOW (ref >60)
GFR calc Af Amer: 37 mL/min — ABNORMAL LOW (ref >60)
GFR calc Af Amer: 39 mL/min — ABNORMAL LOW (ref >60)
GFR calc Af Amer: 43 mL/min — ABNORMAL LOW (ref >60)
GFR calc non Af Amer: 35 mL/min — ABNORMAL LOW (ref >60)
GFR calc non Af Amer: 32 mL/min — ABNORMAL LOW (ref >60)
GFR calc non Af Amer: 32 mL/min — ABNORMAL LOW (ref >60)
GFR calc non Af Amer: 34 mL/min — ABNORMAL LOW (ref >60)
GFR calc non Af Amer: 37 mL/min — ABNORMAL LOW (ref >60)
Glucose, Bld: 231 mg/dL — ABNORMAL HIGH (ref 70–99)
Glucose, Bld: 308 mg/dL — ABNORMAL HIGH (ref 70–99)
Glucose, Bld: 205 mg/dL — ABNORMAL HIGH (ref 70–99)
Glucose, Bld: 194 mg/dL — ABNORMAL HIGH (ref 70–99)
Glucose, Bld: 245 mg/dL — ABNORMAL HIGH (ref 70–99)
Potassium: 7.3 mmol/L (ref 3.5–5.1)
Potassium: 6.9 mmol/L (ref 3.5–5.1)
Potassium: 6.4 mmol/L (ref 3.5–5.1)
Potassium: 6.5 mmol/L (ref 3.5–5.1)
Potassium: 6.2 mmol/L — ABNORMAL HIGH (ref 3.5–5.1)
Sodium: 136 mmol/L (ref 135–145)
Sodium: 134 mmol/L — ABNORMAL LOW (ref 135–145)
Sodium: 136 mmol/L (ref 135–145)
Sodium: 137 mmol/L (ref 135–145)
Sodium: 137 mmol/L (ref 135–145)

## 2018-09-21 LAB — CBC
HCT: 28.8 % — ABNORMAL LOW (ref 36.0–46.0)
Hemoglobin: 8 g/dL — ABNORMAL LOW (ref 12.0–15.0)
MCH: 24.8 pg — ABNORMAL LOW (ref 26.0–34.0)
MCHC: 27.8 g/dL — ABNORMAL LOW (ref 30.0–36.0)
MCV: 89.2 fL (ref 80.0–100.0)
Platelets: 57 10*3/uL — ABNORMAL LOW (ref 150–400)
RBC: 3.23 MIL/uL — ABNORMAL LOW (ref 3.87–5.11)
RDW: 19.2 % — ABNORMAL HIGH (ref 11.5–15.5)
WBC: 4 10*3/uL (ref 4.0–10.5)
nRBC: 0 % (ref 0.0–0.2)

## 2018-09-21 LAB — ECHOCARDIOGRAM LIMITED: Weight: 4797.21 oz

## 2018-09-21 LAB — CORTISOL-PM, BLOOD: Cortisol - PM: 20 ug/dL — ABNORMAL HIGH (ref <10.0)

## 2018-09-21 LAB — NA AND K (SODIUM & POTASSIUM), RAND UR
Potassium Urine: 78 mmol/L
Potassium Urine: 82 mmol/L
Sodium, Ur: <10 mmol/L
Sodium, Ur: <10 mmol/L

## 2018-09-21 LAB — TROPONIN I: Troponin I: 0.03 ng/mL (ref <0.03)

## 2018-09-21 LAB — POTASSIUM
Potassium: 7 mmol/L (ref 3.5–5.1)
Potassium: 7.2 mmol/L (ref 3.5–5.1)

## 2018-09-21 LAB — CK: Total CK: 9 U/L — ABNORMAL LOW (ref 38–234)

## 2018-09-21 LAB — HEMOGLOBIN A1C
Hgb A1c MFr Bld: 5.6 % (ref 4.8–5.6)
Mean Plasma Glucose: 114.02 mg/dL

## 2018-09-21 LAB — MRSA PCR SCREENING: MRSA by PCR: NEGATIVE

## 2018-09-21 MED ORDER — DEXAMETHASONE SODIUM PHOSPHATE 10 MG/ML IJ SOLN
4.0000 mg | Freq: Once | INTRAMUSCULAR | Status: AC
  Administered 2018-09-21: 4 mg via INTRAVENOUS
  Filled 2018-09-21: qty 1

## 2018-09-21 MED ORDER — CHLORHEXIDINE GLUCONATE 0.12 % MT SOLN
15.0000 mL | Freq: Two times a day (BID) | OROMUCOSAL | Status: DC
  Administered 2018-09-21 – 2018-09-29 (×10): 15 mL via OROMUCOSAL
  Filled 2018-09-21 (×14): qty 15

## 2018-09-21 MED ORDER — DEXTROSE 50 % IV SOLN
1.0000 | Freq: Once | INTRAVENOUS | Status: AC
  Administered 2018-09-21: 50 mL via INTRAVENOUS
  Filled 2018-09-21: qty 50

## 2018-09-21 MED ORDER — FUROSEMIDE 10 MG/ML IJ SOLN
60.0000 mg | Freq: Once | INTRAMUSCULAR | Status: AC
  Administered 2018-09-21: 60 mg via INTRAVENOUS
  Filled 2018-09-21: qty 6

## 2018-09-21 MED ORDER — DEXTROSE 50 % IV SOLN
25.0000 mL | Freq: Once | INTRAVENOUS | Status: AC
  Administered 2018-09-21: 25 mL via INTRAVENOUS
  Filled 2018-09-21: qty 50

## 2018-09-21 MED ORDER — LEVOTHYROXINE SODIUM 88 MCG PO TABS
88.0000 ug | ORAL_TABLET | Freq: Every day | ORAL | Status: DC
  Administered 2018-09-21 – 2018-09-22 (×2): 88 ug via ORAL
  Filled 2018-09-21 (×2): qty 1

## 2018-09-21 MED ORDER — INSULIN ASPART 100 UNIT/ML ~~LOC~~ SOLN
0.0000 [IU] | SUBCUTANEOUS | Status: DC
  Administered 2018-09-21: 2 [IU] via SUBCUTANEOUS
  Administered 2018-09-21 (×2): 3 [IU] via SUBCUTANEOUS
  Administered 2018-09-21: 17:00:00 2 [IU] via SUBCUTANEOUS
  Administered 2018-09-21: 3 [IU] via SUBCUTANEOUS
  Administered 2018-09-21: 2 [IU] via SUBCUTANEOUS
  Administered 2018-09-22: 01:00:00 3 [IU] via SUBCUTANEOUS
  Administered 2018-09-22 (×2): 2 [IU] via SUBCUTANEOUS

## 2018-09-21 MED ORDER — METOLAZONE 5 MG PO TABS: 5.0000 mg | ORAL_TABLET | Freq: Once | ORAL | Status: DC

## 2018-09-21 MED ORDER — CALCIUM GLUCONATE 10 % IV SOLN
1.0000 g | Freq: Once | INTRAVENOUS | Status: DC
  Filled 2018-09-21: qty 10

## 2018-09-21 MED ORDER — SODIUM ZIRCONIUM CYCLOSILICATE 10 G PO PACK
10.0000 g | PACK | ORAL | Status: DC
  Filled 2018-09-21: qty 1

## 2018-09-21 MED ORDER — INSULIN REGULAR BOLUS VIA INFUSION
3.0000 [IU] | Freq: Once | INTRAVENOUS | Status: DC
  Filled 2018-09-21: qty 3

## 2018-09-21 MED ORDER — SODIUM POLYSTYRENE SULFONATE 15 GM/60ML PO SUSP
30.0000 g | Freq: Once | ORAL | Status: AC
  Administered 2018-09-21: 30 g via ORAL
  Filled 2018-09-21: qty 120

## 2018-09-21 MED ORDER — SODIUM ZIRCONIUM CYCLOSILICATE 10 G PO PACK
10.0000 g | PACK | Freq: Once | ORAL | Status: AC
  Administered 2018-09-21: 07:00:00 10 g via ORAL
  Filled 2018-09-21: qty 1

## 2018-09-21 MED ORDER — ALBUTEROL SULFATE (2.5 MG/3ML) 0.083% IN NEBU
10.0000 mg | INHALATION_SOLUTION | Freq: Once | RESPIRATORY_TRACT | Status: AC
  Administered 2018-09-21: 10 mg via RESPIRATORY_TRACT
  Filled 2018-09-21: qty 12

## 2018-09-21 MED ORDER — METOPROLOL TARTRATE 5 MG/5ML IV SOLN
2.5000 mg | INTRAVENOUS | Status: DC | PRN
  Filled 2018-09-21: qty 5

## 2018-09-21 MED ORDER — FUROSEMIDE 10 MG/ML IJ SOLN
20.0000 mg | Freq: Two times a day (BID) | INTRAMUSCULAR | Status: DC
  Administered 2018-09-21 (×2): 20 mg via INTRAVENOUS
  Filled 2018-09-21 (×2): qty 2

## 2018-09-21 MED ORDER — SODIUM CHLORIDE 0.9 % IV BOLUS
250.0000 mL | Freq: Once | INTRAVENOUS | Status: AC
  Administered 2018-09-21: 250 mL via INTRAVENOUS

## 2018-09-21 MED ORDER — BUDESONIDE 0.5 MG/2ML IN SUSP
0.5000 mg | Freq: Two times a day (BID) | RESPIRATORY_TRACT | Status: DC
  Administered 2018-09-21 – 2018-09-22 (×3): 0.5 mg via RESPIRATORY_TRACT
  Filled 2018-09-21 (×3): qty 2

## 2018-09-21 MED ORDER — ALBUTEROL SULFATE (2.5 MG/3ML) 0.083% IN NEBU
INHALATION_SOLUTION | RESPIRATORY_TRACT | Status: AC
  Administered 2018-09-21: 14:00:00 2.5 mg
  Filled 2018-09-21: qty 12

## 2018-09-21 MED ORDER — SODIUM CHLORIDE 0.9% FLUSH: 10.0000 mL | INTRAVENOUS | Status: DC | PRN

## 2018-09-21 MED ORDER — INSULIN ASPART 100 UNIT/ML IV SOLN
10.0000 [IU] | Freq: Once | INTRAVENOUS | Status: AC
  Administered 2018-09-21: 10 [IU] via INTRAVENOUS

## 2018-09-21 MED ORDER — IPRATROPIUM-ALBUTEROL 0.5-2.5 (3) MG/3ML IN SOLN
3.0000 mL | Freq: Four times a day (QID) | RESPIRATORY_TRACT | Status: DC
  Administered 2018-09-21 – 2018-09-22 (×6): 3 mL via RESPIRATORY_TRACT
  Filled 2018-09-21 (×6): qty 3

## 2018-09-21 MED ORDER — ORAL CARE MOUTH RINSE
15.0000 mL | Freq: Two times a day (BID) | OROMUCOSAL | Status: DC
  Administered 2018-09-22 – 2018-09-29 (×7): 15 mL via OROMUCOSAL

## 2018-09-21 MED ORDER — SODIUM POLYSTYRENE SULFONATE 15 GM/60ML PO SUSP
30.0000 g | Freq: Once | ORAL | Status: DC
  Filled 2018-09-21: qty 120

## 2018-09-21 MED ORDER — CALCIUM GLUCONATE-NACL 1-0.675 GM/50ML-% IV SOLN
1.0000 g | Freq: Once | INTRAVENOUS | Status: AC
  Administered 2018-09-21: 1000 mg via INTRAVENOUS
  Filled 2018-09-21: qty 50

## 2018-09-21 MED ORDER — SODIUM POLYSTYRENE SULFONATE 15 GM/60ML PO SUSP
30.0000 g | Freq: Four times a day (QID) | ORAL | Status: DC | PRN
  Filled 2018-09-21: qty 120

## 2018-09-21 MED ORDER — INSULIN ASPART 100 UNIT/ML IV SOLN: 5.0000 [IU] | Freq: Once | INTRAVENOUS | Status: DC

## 2018-09-21 MED ORDER — SODIUM CHLORIDE 0.9 % IV SOLN
INTRAVENOUS | Status: DC | PRN
  Administered 2018-09-21 – 2018-10-05 (×6): via INTRAVENOUS

## 2018-09-21 MED ORDER — FUROSEMIDE 10 MG/ML IJ SOLN
40.0000 mg | Freq: Once | INTRAMUSCULAR | Status: AC
  Administered 2018-09-21: 02:00:00 40 mg via INTRAVENOUS
  Filled 2018-09-21: qty 4

## 2018-09-21 MED ORDER — SODIUM POLYSTYRENE SULFONATE 15 GM/60ML PO SUSP: 30.0000 g | Freq: Once | ORAL | Status: DC

## 2018-09-21 MED ORDER — ALBUTEROL SULFATE (2.5 MG/3ML) 0.083% IN NEBU
10.0000 mg | INHALATION_SOLUTION | Freq: Once | RESPIRATORY_TRACT | Status: AC
  Administered 2018-09-21: 2.5 mg via RESPIRATORY_TRACT

## 2018-09-21 MED ORDER — SODIUM BICARBONATE 8.4 % IV SOLN
50.0000 meq | Freq: Once | INTRAVENOUS | Status: AC
  Administered 2018-09-21: 15:00:00 50 meq via INTRAVENOUS
  Filled 2018-09-21: qty 50

## 2018-09-21 MED ORDER — SODIUM POLYSTYRENE SULFONATE 15 GM/60ML PO SUSP
30.0000 g | Freq: Once | ORAL | Status: AC
  Administered 2018-09-21: 18:00:00 30 g via RECTAL
  Filled 2018-09-21: qty 120

## 2018-09-21 MED ORDER — INSULIN ASPART 100 UNIT/ML ~~LOC~~ SOLN
5.0000 [IU] | Freq: Once | SUBCUTANEOUS | Status: AC
  Administered 2018-09-21: 5 [IU] via INTRAVENOUS

## 2018-09-21 MED ORDER — SODIUM ZIRCONIUM CYCLOSILICATE 5 G PO PACK
5.0000 g | PACK | ORAL | Status: DC
  Filled 2018-09-21: qty 1

## 2018-09-21 MED ORDER — DILTIAZEM HCL ER COATED BEADS 180 MG PO CP24: 180.0000 mg | ORAL_CAPSULE | Freq: Every day | ORAL | Status: DC

## 2018-09-21 MED ORDER — INSULIN ASPART 100 UNIT/ML ~~LOC~~ SOLN
5.0000 [IU] | Freq: Once | SUBCUTANEOUS | Status: AC
  Administered 2018-09-21: 5 [IU] via SUBCUTANEOUS

## 2018-09-21 MED ORDER — INSULIN ASPART 100 UNIT/ML ~~LOC~~ SOLN
4.0000 [IU] | Freq: Once | SUBCUTANEOUS | Status: AC
  Administered 2018-09-22: 4 [IU] via SUBCUTANEOUS

## 2018-09-21 MED ORDER — ARFORMOTEROL TARTRATE 15 MCG/2ML IN NEBU
15.0000 ug | INHALATION_SOLUTION | Freq: Two times a day (BID) | RESPIRATORY_TRACT | Status: DC
  Administered 2018-09-21 – 2018-09-22 (×3): 15 ug via RESPIRATORY_TRACT
  Filled 2018-09-21 (×3): qty 2

## 2018-09-21 MED ORDER — DILTIAZEM HCL 25 MG/5ML IV SOLN
15.0000 mg | Freq: Four times a day (QID) | INTRAVENOUS | Status: DC
  Administered 2018-09-21 – 2018-09-22 (×3): 15 mg via INTRAVENOUS
  Filled 2018-09-21 (×4): qty 5

## 2018-09-21 MED ORDER — INSULIN DETEMIR 100 UNIT/ML ~~LOC~~ SOLN
10.0000 [IU] | Freq: Two times a day (BID) | SUBCUTANEOUS | Status: DC
  Administered 2018-09-21 – 2018-10-03 (×25): 10 [IU] via SUBCUTANEOUS
  Filled 2018-09-21 (×33): qty 0.1

## 2018-09-21 NOTE — Progress Notes (Signed)
CRITICAL VALUE ALERT  Critical Value: K+ 7.2   Date & Time Notied: 09/21/2018 @ 1350  Provider Notified: Audria Nine, DO  Orders Received/Actions taken: awaiting orders.

## 2018-09-21 NOTE — Progress Notes (Signed)
[facility] Progress Note Patient Name: Renee Noble DOB: 03/23/1956 MRN: [id number]   Date of Service  09/21/2018  HPI/Events of Note  Persistent hyperkalemia of unclear etiology. It is recurrent despite RX, Pt does has CKD  eICU Interventions  Hyperkalemia Rx orders, screen for adrenal insufficiency given persistence of the hyperkalemia and mild hyponatremia        Ismael Karge U Cadan Maggart 09/21/2018, 9:24 PM

## 2018-09-21 NOTE — Progress Notes (Signed)
Patient transported to 3M09 without any complications.

## 2018-09-21 NOTE — Progress Notes (Signed)
eLink Physician-Brief Progress Note Patient Name: Renee Noble DOB: 06-16-1955 MRN: [id number]   Date of Service  09/21/2018  HPI/Events of Note  Patient unable to take oral K+ binding resin due to NPO status  eICU Interventions  Kayexalate 30 gm PR Q 6 hours prn K+ > 5.0        [place] U Ogan 09/21/2018, 2:45 AM

## 2018-09-21 NOTE — Progress Notes (Signed)
RT obtained repeat ABG on pt with the following results. Critical PCO2 called to MD Ogan. BIPAP adjustments made (rate increased to 20, EPAP decreased to 5). RT will continue to monitor.     Ref. Range 09/21/2018 04:45  Sample type Unknown ARTERIAL DRAW  Delivery systems Unknown BILEVEL POSITIVE AIRWAY PRESSURE  FIO2 Unknown 60.00  Inspiratory PAP Unknown 20  Expiratory PAP Unknown 6  pH, Arterial Latest Ref Range: 7.350 - 7.450  7.287 (L)  pCO2 arterial Latest Ref Range: 32.0 - 48.0 mmHg 87.4 (HH)  pO2, Arterial Latest Ref Range: 83.0 - 108.0 mmHg 136 (H)  Acid-Base Excess Latest Ref Range: 0.0 - 2.0 mmol/L 13.4 (H)  Bicarbonate Latest Ref Range: 20.0 - 28.0 mmol/L 40.4 (H)  O2 Saturation Latest Units: % 99.2  Patient temperature Unknown 98.6  Collection site Unknown RIGHT RADIAL  Allens test (pass/fail) Latest Ref Range: PASS  PASS

## 2018-09-21 NOTE — Progress Notes (Signed)
CRITICAL VALUE ALERT  Critical Value:  Potassium 6.5  Date & Time Notied:  09/21/18 8:46 PM   Provider Notified: Lucile Shutters, MD  Orders Received/Actions taken: awaiting orders

## 2018-09-21 NOTE — Progress Notes (Signed)
Large return from enema given at [facility]

## 2018-09-21 NOTE — Progress Notes (Signed)
CRITICAL VALUE STICKER  CRITICAL VALUE: k - 7 Called to [employer]

## 2018-09-21 NOTE — Progress Notes (Signed)
RT obtained ABG on pt with the following result. Critical value called to MD Ogan on PCO2 96.9.   Ph 7.25 PCO2 96.9 PaO2 70.3 HCO3 41  Increased IPAP to 20, decreased EPAP to 6, FIO2 increased to 60% per MD Ogan. ABG in one hour post BIPAP changes. RT will continue to monitor.

## 2018-09-21 NOTE — Progress Notes (Addendum)
NAME:  Renee Noble, MRN:  [id number], DOB:  09/28/1955, LOS: 0 ADMISSION DATE:  10/12/2018, CONSULTATION DATE:  09/21/2018 REFERRING MD:  Dr. Ralene Bathe, CHIEF COMPLAINT:  AMS  Brief History   45 yoF recently discharged back with acute on chronic hypoxic/ hypercarbic respiratory failure and acute on chronic heart failure.  Now with hyperkalemia, on BiPAP.     Past Medical History  chronic hypoxic respiratory failure on 3 L oxygen at home, COPD stage 4 with FEV1 of 27%, chronic diastolic CHF, PAF - not on AC given chronic thrombocytopenia/ recent anemia, diabetes type 2, obesity, HTN, hep C, CKD, HLD, chronic pain, hypothyroidism, depression, fibromyalgia  Significant Hospital Events   4/13- 5/2 hospitalized  5/7 Admitted  Consults:   Procedures:   Significant Diagnostic Tests:   Micro Data:  5/6 BC x 2 >> 5/6 COVID >> neg  Antimicrobials:   Interim history/subjective:  5/7: potassium 7.3 this am (without improvement from previous checks), given kayexylate. Recheck to 6.9. off NIV on [place] this am. And conversant, pt endorses taking her lasix and potassium as ordered  Objective   Blood pressure 98/82, pulse 87, temperature 98.5 F (36.9 C), temperature source Oral, resp. rate 19, SpO2 99 %.    FiO2 (%):  [40 %] 40 %  No intake or output data in the 24 hours ending 09/25/2018 2358 There were no vitals filed for this visit.  Examination: General:  Chronically ill morbidly obese, female on BiPAP in NAD HEENT: pupils 3/reactive, full face mask- loose fitting up and lower dentures removed and given to bedside RN, no neck Neuro: Awake, oriented, appropiate, MAE, tremor noted in BUE CV: IRIR, distant HS PULM: even/non-labored on BIPAP 16/6, 40% w/ TVs ~400, lungs bilaterally coarse, diminished in bases GI: soft, upper abd hernia- soft, non-tender, bs active, foley in place Extremities: warm/dry, + 2 LE edema w/ chronic venous stasis changes Skin: no rashes, mild bruising to  upper arms, posterior not visualized at this time  Resolved Hospital Problem list    Assessment & Plan:  Acute on chronic hypoxic and hypercapnic respiratory failure resp acidosis - multifactorial due to diastolic HF, pulmonary edema, bilateral effusions, OHS/ OSA, COPD 4 w/FEV1 30% - her baseline CO2 is ~80 and chronically on 3L O2 P:  High risk for intubation, will monitor in ICU for now Transition to diet as tolerated but ensure renal for LOW K Continue BiPAP prn and mandatory while sleeping Supplemental O2 for sat goal 88-94%  duonebs q6 with brovanna/ pulmicort Diuresis as below  Should qualify for home NIV, will need to set up prior to discharge Caution with chronic sedating meds- she is on MS contin  -COVID screen negative -blood cx pending: ngtd -appears more related to HF RUL pulmonary nodule P:  recs for outpatient f/u CT in June/ July 2020  Acute on chronic HFpEF - hx of noncompliance with fluid restriction -BNP 1,090 -trop 0.03 will trend P:  Tele monitoring  Additional lasix 40 mg now, then 40 mg BID Strict I/O's/ daily weights  -limited echo pending, last one in 2019 with  Normal EF -follow trop, ekg without acute changed Paroxysmal Afib - not on AC given prior worsening anemia/ thrombocytopenia P:  Rate currently controlled Hold LA cardizem to avoid hypotension with diuresis Would add prn metoprolol if needed    ARF on CKD 3 - baseline sCr 1.2 P:  Continue foley  Trend BMP / urinary output Avoid nephrotoxic agents, ensure adequate renal perfusion Hyperkalemia  -  s/p calcium gluconate, bicarb, insulin 5 units IV, and 250 ml of D10  P:  Is getting additional lasix for HF which will additionally help lokelma 10 mg now kayexylate given this am via enema but pt able to take PO so will give 1 additional dose later today.  -freq potassium checks until improving -uop marginal   IDDM type 2, with hyperglycemia P:  CBG q4 SSI sensitive   levemir 10 units BID -check a1c  Chronic anemia/ thrombocytopenia P:  -relatively Stable, will trend CBC -noted some blood found after disimpaction from rectum.  -follow -no acute indication for transfusion   Hx of noncompliance with multiple comorbidities - multiple discussions held on prior admit, PMT involved, patient wishes to remain full code.    Best practice:  Diet: renal heart healthy Pain/Anxiety/Delirium protocol (if indicated): n/a VAP protocol (if indicated): n/a DVT prophylaxis: heparin SQ/ SCDs GI prophylaxis: n/a Glucose control: SSI/ levemir Mobility: bedrest Code Status: full Family Communication: patient updated on plan of care Disposition: ICU  Labs   CBC: LastLabs           Recent Labs  Lab 09/14/18 0345 09/14/18 1419 09/15/18 0401 09/15/18 1629 09/16/18 0432 09/27/2018 2157 09/26/2018 2205  WBC 6.3 6.9 5.1 6.5 5.1 6.2  --   NEUTROABS 5.2 6.2 4.3  --  4.4 5.4  --   HGB 7.1* 7.1* 6.9* 8.1* 7.7* 8.1* 9.9*  HCT 25.9* 25.2* 25.8* 28.2* 27.0* 30.5* 29.0*  MCV 86.6 87.5 87.2 88.1 89.1 91.6  --   PLT 52* 51* 43* 43* 37* 92*  --       Basic Metabolic Panel: LastLabs         Recent Labs  Lab 09/14/18 0345 09/15/18 0401 09/16/18 0432 09/22/2018 2157 10/06/2018 2205  NA 140 137 138 136 131*  K 3.4* 4.0 4.0 7.2* 6.9*  CL 81* 82* 84* 86*  --   CO2 49* 46* 44* 41*  --   GLUCOSE 120* 151* 117* 204*  --   BUN 46* 46* 45* 66*  --   CREATININE 1.07* 1.15* 1.08* 1.60* 1.90*  CALCIUM 8.4* 8.3* 8.6* 8.9  --      GFR: Estimated Creatinine Clearance: 39 mL/min (A) (by C-G formula based on SCr of 1.9 mg/dL (H)). LastLabs        Recent Labs  Lab 09/15/18 0401 09/15/18 1629 09/16/18 0432 10/10/2018 2157  WBC 5.1 6.5 5.1 6.2  LATICACIDVEN  --   --   --  1.5      Liver Function Tests: LastLabs  Recent Labs  Lab 09/14/18 0345 09/15/18 0401 09/30/2018 2157  AST 20 20 38  ALT 14 16 59*  ALKPHOS 57 68 161*  BILITOT 0.5 0.8 0.7   PROT 4.9* 5.3* 5.9*  ALBUMIN 2.9* 3.1* 3.2*     LastLabs  No results for input(s): LIPASE, AMYLASE in the last 168 hours.   LastLabs  No results for input(s): AMMONIA in the last 168 hours.    ABG Labs(Brief)          Component Value Date/Time   PHART 7.474 (H) 09/13/2018 1236   PCO2ART 74.9 (HH) 09/13/2018 1236   PO2ART 34.8 (LL) 09/13/2018 1236   HCO3 46.3 (H) 10/15/2018 2205   TCO2 49 (H) 09/19/2018 2205   O2SAT 45.0 10/13/2018 2205       Coagulation Profile: LastLabs     Recent Labs  Lab 09/21/2018 2157  INR 1.3*      Cardiac Enzymes: LastLabs  Recent Labs  Lab 10/09/2018 2157  TROPONINI 0.03*      HbA1C: LastLabs         Hgb A1c MFr Bld  Date/Time Value Ref Range Status  06/12/2018 04:10 AM 5.6 4.8 - 5.6 % Final    Comment:    (NOTE) Pre diabetes:          5.7%-6.4% Diabetes:              >6.4% Glycemic control for   <7.0% adults with diabetes   04/09/2018 05:39 AM 5.2 4.8 - 5.6 % Final    Comment:    (NOTE) Pre diabetes:          5.7%-6.4% Diabetes:              >6.4% Glycemic control for   <7.0% adults with diabetes       CBG: LastLabs   Audria Nine DO Pager: [phone] After hours pager: [phone]  [facility] 09/21/2018, 6:01 AM

## 2018-09-21 NOTE — Consult Note (Signed)
[facility] Renal Consultation Note  Requesting MD: Audria Nine, DO Indication for Consultation:  hyperkalemia  Chief complaint: shortness of breath   HPI:  Renee Noble is a 63 y.o. female with a history of CKD, DM, COPD, and CHF presented with shortness of breath, acute on chronic hypercarbic respiratory failure.  History from the patient is limited as she has been put back on the BIPAP for confusion.  She was found to have hyperkalemia.  She has received lasix 40 mg IV x 2 overnight and lasix 20 mg IV once today at 11:00; also received albuterol, insulin/glucose, bicarb x 1.  Just got kayexalate at noon.  Per nursing she spilt and did not take the dose which is actually charted at 1111 am today.  Note lokelma given 7 am today.  Her potassium is 7.2 on recheck at 13:01 pm.  She has not yet had a BM in response to kayexalate.  Per nursing she was not able to tolerate a kayexalate enema last night - this is not charted that I can find.  She is on K supplement and diuretic regimen at normally for the CHF.  Note baseline Cr is variable but appears near 1.3 - 1.6.  AKI event in April with peak Cr 2.82 on 4/16.  Spoke with patient's family - Venancio Poisson (cousin).  She reports that the patient does not have a good relationship with her son and [name] is her emergent contact and has been making her medical decisions.  She states that patient would want to avoid HD if able but to pursue if continues to be refractory.     Creatinine, Ser  Date/Time Value Ref Range Status  09/21/2018 06:01 AM 1.67 (H) 0.44 - 1.00 mg/dL Final  09/21/2018 03:22 AM 1.55 (H) 0.44 - 1.00 mg/dL Final  10/06/2018 10:05 PM 1.90 (H) 0.44 - 1.00 mg/dL Final  09/24/2018 09:57 PM 1.60 (H) 0.44 - 1.00 mg/dL Final  09/16/2018 04:32 AM 1.08 (H) 0.44 - 1.00 mg/dL Final  09/15/2018 04:01 AM 1.15 (H) 0.44 - 1.00 mg/dL Final  09/14/2018 03:45 AM 1.07 (H) 0.44 - 1.00 mg/dL Final  09/13/2018 04:31 AM 0.94 0.44 -  1.00 mg/dL Final  09/12/2018 05:00 AM 0.90 0.44 - 1.00 mg/dL Final  09/11/2018 04:00 AM 0.99 0.44 - 1.00 mg/dL Final  09/10/2018 07:37 AM 1.13 (H) 0.44 - 1.00 mg/dL Final  09/09/2018 03:01 AM 1.39 (H) 0.44 - 1.00 mg/dL Final  09/08/2018 04:20 AM 1.63 (H) 0.44 - 1.00 mg/dL Final  09/07/2018 09:28 AM 1.73 (H) 0.44 - 1.00 mg/dL Final  09/06/2018 11:30 PM 1.70 (H) 0.44 - 1.00 mg/dL Final  09/06/2018 06:02 AM 1.77 (H) 0.44 - 1.00 mg/dL Final  09/05/2018 10:42 AM 1.97 (H) 0.44 - 1.00 mg/dL Final  09/04/2018 03:34 AM 1.93 (H) 0.44 - 1.00 mg/dL Final  09/03/2018 07:10 AM 1.91 (H) 0.44 - 1.00 mg/dL Final  09/02/2018 01:11 AM 2.10 (H) 0.44 - 1.00 mg/dL Final  09/01/2018 08:26 AM 2.49 (H) 0.44 - 1.00 mg/dL Final  08/31/2018 03:21 PM 2.80 (H) 0.44 - 1.00 mg/dL Final  08/31/2018 03:30 AM 2.82 (H) 0.44 - 1.00 mg/dL Final  08/30/2018 08:18 PM 2.73 (H) 0.44 - 1.00 mg/dL Final  08/30/2018 03:20 AM 2.42 (H) 0.44 - 1.00 mg/dL Final  08/29/2018 04:12 AM 1.77 (H) 0.44 - 1.00 mg/dL Final  08/28/2018 06:24 PM 1.63 (H) 0.44 - 1.00 mg/dL Final  07/31/2018 02:31 PM 1.17 0.40 - 1.20 mg/dL Final  07/17/2018 02:52 AM 1.33 (  H) 0.44 - 1.00 mg/dL Final  07/16/2018 04:30 AM 1.12 (H) 0.44 - 1.00 mg/dL Final  07/15/2018 10:06 AM 1.15 (H) 0.44 - 1.00 mg/dL Final  07/14/2018 05:40 PM 1.10 (H) 0.44 - 1.00 mg/dL Final  07/13/2018 10:54 AM 1.14 0.40 - 1.20 mg/dL Final  06/21/2018 02:33 PM 1.15 0.40 - 1.20 mg/dL Final  06/15/2018 04:11 AM 1.18 (H) 0.44 - 1.00 mg/dL Final  06/14/2018 04:05 AM 1.28 (H) 0.44 - 1.00 mg/dL Final  06/13/2018 04:31 AM 1.52 (H) 0.44 - 1.00 mg/dL Final  06/12/2018 04:10 AM 1.46 (H) 0.44 - 1.00 mg/dL Final  06/11/2018 04:24 AM 1.20 (H) 0.44 - 1.00 mg/dL Final  06/10/2018 08:58 PM 1.28 (H) 0.44 - 1.00 mg/dL Final  06/10/2018 07:29 AM 1.24 (H) 0.44 - 1.00 mg/dL Final  06/09/2018 07:08 PM 1.40 (H) 0.44 - 1.00 mg/dL Final  06/08/2018 09:15 PM 1.57 (H) 0.44 - 1.00 mg/dL Final  04/18/2018 11:35 AM 1.20  0.40 - 1.20 mg/dL Final  04/11/2018 05:09 AM 1.59 (H) 0.44 - 1.00 mg/dL Final  04/10/2018 05:46 AM 1.37 (H) 0.44 - 1.00 mg/dL Final  04/09/2018 05:38 AM 1.58 (H) 0.44 - 1.00 mg/dL Final  04/08/2018 06:45 AM 1.35 (H) 0.44 - 1.00 mg/dL Final  04/07/2018 04:42 PM 1.38 (H) 0.44 - 1.00 mg/dL Final  03/14/2018 12:04 PM 1.18 0.40 - 1.20 mg/dL Final  03/02/2018 03:57 AM 1.98 (H) 0.44 - 1.00 mg/dL Final  03/01/2018 05:36 AM 2.02 (H) 0.44 - 1.00 mg/dL Final     PMHx:   Past Medical History:  Diagnosis Date  . Abdominal mass of other site   . Cervical compression fracture ([place])   . CHF (congestive heart failure) ([place])   . Chronic kidney disease, stage 3 (HCC)    Borderline Stage 2-3  . Chronic lower limb pain   . COPD (chronic obstructive pulmonary disease) ([place])   . CTS (carpal tunnel syndrome)   . Depression   . Diabetes ([place])    Type II  . Fibromyalgia   . Hepatitis C    cured last year (2018)  . Hypercholesteremia   . Hypertension   . Hypothyroidism   . Morbid obesity ([place])   . Neuropathy    Diabetes  . OSA (obstructive sleep apnea)   . Osteoarthritis   . Renal cancer ([place])    Left Kidney Removed  . RLS (restless legs syndrome)   . Syncope   . Venous stasis     Past Surgical History:  Procedure Laterality Date  . ABDOMINAL HERNIA REPAIR     x2  . ABDOMINAL HYSTERECTOMY    . BLADDER SUSPENSION    . CHOLECYSTECTOMY    . CYST EXCISION     Head  . INCISIONAL HERNIA REPAIR    . KNEE ARTHROSCOPY     Bilateral  . NEPHRECTOMY     Left  . TONSILLECTOMY    . TOOTH EXTRACTION    . UMBILICAL HERNIA REPAIR    . VAGINA SURGERY    . WISDOM TOOTH EXTRACTION      Family Hx:  Family History  Problem Relation Age of Onset  . Heart attack Mother 54       Deceased  . Heart disease Mother   . Emphysema Mother   . Alcoholism Mother   . COPD Father 82       Deceased  . Emphysema Father   . Alcoholism Father   . Esophageal varices Father   . Alcoholism Paternal  Grandfather   .  Diabetes Maternal Grandmother   . Heart disease Maternal Grandmother   . Lung cancer Maternal Grandfather   . Emphysema Maternal Grandfather   . Brain cancer Maternal Aunt   . Diabetes Sister   . Heart defect Sister   . Cancer Sister        was told her sister had cancer but beat it and dont know which one   . Heart defect Sister   . Obesity Son   . Breast cancer Maternal Aunt   . Colon cancer Neg Hx   . Esophageal cancer Neg Hx     Social History:  reports that she quit smoking about 2 years ago. Her smoking use included cigarettes. She has a 112.50 pack-year smoking history. She has never used smokeless tobacco. She reports that she does not drink alcohol or use drugs.  Allergies:  Allergies  Allergen Reactions  . Gabapentin Anaphylaxis  . Lyrica [Pregabalin] Shortness Of Breath    Trouble breathing  . Ketorolac Tromethamine Hives  . Lisinopril Cough    Medications: Prior to Admission medications   Medication Sig Start Date End Date Taking? Authorizing Provider  albuterol (PROAIR HFA) 108 (90 Base) MCG/ACT inhaler Inhale 1-2 puffs into the lungs every 6 (six) hours as needed for wheezing or shortness of breath. 01/20/18  Yes Tanda Rockers, MD  arformoterol (BROVANA) 15 MCG/2ML NEBU Take 2 mLs (15 mcg total) by nebulization 2 (two) times daily. 09/16/18  Yes Pahwani, Einar Grad, MD  budesonide-formoterol (SYMBICORT) 160-4.5 MCG/ACT inhaler Inhale 2 puffs into the lungs 2 (two) times daily. 06/29/18  Yes Tanda Rockers, MD  clotrimazole-betamethasone (LOTRISONE) cream Apply 1 application topically 2 (two) times daily as needed. Patient taking differently: Apply 1 application topically 2 (two) times daily as needed (yeast).  05/02/18  Yes [name], PA-C  dextromethorphan-guaiFENesin [facility] DM) 30-600 MG 12hr tablet Take 1 tablet by mouth 2 (two) times daily as needed for cough. 06/15/18  Yes Hosie Poisson, MD  diltiazem (CARDIZEM CD) 180 MG 24 hr capsule Take 1  capsule (180 mg total) by mouth daily. 06/02/18  Yes [name], PA-C  furosemide (LASIX) 40 MG tablet Take 1 tablet (40 mg total) by mouth 2 (two) times daily. 09/16/18  Yes Pahwani, Einar Grad, MD  insulin detemir (LEVEMIR) 100 UNIT/ML injection Inject 0.1 mLs (10 Units total) into the skin 2 (two) times daily. 09/16/18  Yes Pahwani, Einar Grad, MD  insulin lispro (HUMALOG) 100 UNIT/ML injection Inject 0.15 mLs (15 Units total) into the skin 3 (three) times daily as needed for high blood sugar. Uses sliding scale. Sugar over 200 will give insulin. Patient taking differently: Inject 1-10 Units into the skin See admin instructions. at 0600, 1100, 1600, 2100 per Sliding Scale: 141-170 1 unit 171-200 2 units 201-230 3 units 231-260 4 units 261-290 5 units 291-320 6 units 321-350 7 units 351-380 8 units 381-400 10 units 07/14/18  Yes [name], PA-C  ipratropium-albuterol (DUONEB) 0.5-2.5 (3) MG/3ML SOLN Take 3 mLs by nebulization 4 (four) times daily. Dx: J44.9 03/21/18  Yes Parrett, [name] S, NP  levothyroxine (SYNTHROID, LEVOTHROID) 88 MCG tablet TAKE ONE TABLET BY MOUTH DAILY Patient taking differently: Take 88 mcg by mouth every evening.  07/20/18  Yes [name], PA-C  morphine (MS CONTIN) 30 MG 12 hr tablet Take 1 tablet (30 mg total) by mouth every 12 (twelve) hours. 09/16/18  Yes Darliss Cheney, MD  Multiple Vitamins-Minerals (CENTRUM SILVER PO) Take by mouth.   Yes [provider]  nortriptyline (PAMELOR) 25 MG capsule Take 1 capsule (25 mg total) by mouth at bedtime. 06/21/18  Yes [name], PA-C  nystatin (MYCOSTATIN/NYSTOP) powder APPLY TOPICALLY TWICE DAILY AS NEEDED FOR YEAST INFECTION Patient taking differently: Apply 1 g topically 2 (two) times daily as needed (yeast).  09/27/17  Yes [name], PA-C  ondansetron (ZOFRAN) 4 MG tablet Take 4 mg by mouth 3 (three) times daily before meals. For 3 days 5.5.20 to 5.7.20   Yes [provider]  OXYGEN Inhale  3.5 L into the lungs continuous. O2 3.5L at home.   Yes [provider]  potassium chloride SA (K-DUR) 20 MEQ tablet Take 2 tablets (40 mEq total) by mouth daily. 09/16/18  Yes Pahwani, Einar Grad, MD  pravastatin (PRAVACHOL) 20 MG tablet TAKE ONE TABLET BY MOUTH EVERY DAY Patient taking differently: Take 20 mg by mouth every evening.  05/05/18  Yes [name], PA-C  promethazine (PHENERGAN) 25 MG tablet Take 25 mg by mouth every 6 (six) hours as needed for nausea or vomiting.   Yes [provider]  sodium polystyrene (KAYEXALATE) 15 GM/60ML suspension Take 30 g by mouth See admin instructions. 30g daily for two days. 5.6.20 thru 5.7.20   Yes [provider]  glucose blood (ACCU-CHEK AVIVA) test strip Check blood sugars 4 times per day for diabetes. Dx:E11.29 07/24/18   [name], PA-C  insulin glargine (LANTUS) 100 UNIT/ML injection Inject 30 Units into the skin daily.    [provider]    I have reviewed the patient's current medications.  Labs:  BMP Latest Ref Rng & Units 09/21/2018 09/21/2018 09/21/2018  Glucose 70 - 99 mg/dL - - 308(H)  BUN 8 - 23 mg/dL - - 71(H)  Creatinine 0.44 - 1.00 mg/dL - - 1.67(H)  Sodium 135 - 145 mmol/L - - 134(L)  Potassium 3.5 - 5.1 mmol/L 7.2(HH) 7.0(HH) 6.9(HH)  Chloride 98 - 111 mmol/L - - 82(L)  CO2 22 - 32 mmol/L - - 41(H)  Calcium 8.9 - 10.3 mg/dL - - 8.6(L)    Urinalysis    Component Value Date/Time   COLORURINE YELLOW 08/29/2018 0950   APPEARANCEUR CLEAR 08/29/2018 0950   LABSPEC 1.014 08/29/2018 0950   PHURINE 5.0 08/29/2018 0950   GLUCOSEU NEGATIVE 08/29/2018 0950   GLUCOSEU NEGATIVE 08/17/2016 1340   HGBUR SMALL (A) 08/29/2018 0950   BILIRUBINUR NEGATIVE 08/29/2018 0950   BILIRUBINUR neg 01/15/2015 1353   KETONESUR NEGATIVE 08/29/2018 0950   PROTEINUR 30 (A) 08/29/2018 0950   UROBILINOGEN 1.0 08/17/2016 1340   NITRITE NEGATIVE 08/29/2018 0950   LEUKOCYTESUR NEGATIVE 08/29/2018 0950      ROS:  Limited secondary to AMS   Physical Exam: Vitals:   09/21/18 0900 09/21/18 1346  BP: (!) 119/55   Pulse: (!) 140 (!) 102  Resp: 15 17  Temp:    SpO2: 100% 98%     General: Elderly female in bed on BIPAP  HEENT: NCAT  Eyes:EOMI; sclera anicteric Neck:increased neck circumference  Heart:tachycardia; no rub  Lungs:coarse breath sounds  Abdomen: body wall edema and distended with obese habitus; reduced bowel sounds Extremities: 1-2 + edema bilateral lower extremities  Skin: chronic venous stasis changes bilaterally  Neuro: awake and interactive on BIPAP but confused GU: foley catheter in place   Assessment/Plan:  # Hyperkalemia - With mild AKI and on potassium supplement at home  - Lasix IV once now; NS 250 mL IV once  - Agree with repeating  temporizing measures - insulin/glucose, bicarb - Kayexalate once now (to clarify this the second dose today) - we will attempt an enema - Repeat BMP  # CKD stage III - Baseline Cr near 1.3 - 1.6   # Acute on chronic hypercarbic respiratory failure with hypoxia - On BIPAP  - Optimize volume status   # CHF chronic diastolic  - Lasix IV once now   # Solitary kidney  - Note patient with nephrectomy    Claudia Desanctis 09/21/2018, 2:10 PM

## 2018-09-21 NOTE — ED Notes (Signed)
ED TO INPATIENT HANDOFF REPORT  ED Nurse Name and Phone #:   Lenice Pressman [phone]  S Name/Age/Gender Renee Noble 63 y.o. female Room/Bed: 024C/024C  Code Status   Code Status: Full Code  Home/SNF/Other Rehab Patient oriented to: self, place, time and situation Is this baseline? Yes   Triage Complete: Triage complete  Chief Complaint AMS  Triage Note Per EMS pt presents to the ED with Elevated K+ and altered mental status. Pt reports mild shortness of breath. Pt uses 3L nasal cannula at baseline. Pt has moments of confusion but when spoken to is alert and oriented x4.    Allergies Allergies  Allergen Reactions  . Gabapentin Anaphylaxis  . Lyrica [Pregabalin] Shortness Of Breath    Trouble breathing  . Ketorolac Tromethamine Hives  . Lisinopril Cough    Level of Care/Admitting Diagnosis ED Disposition    ED Disposition Condition [facility] Area: [place] [[id number]]  Level of Care: ICU [6]  Covid Evaluation: N/A  Diagnosis: Respiratory failure with hypercapnia [facility]) [[id number]]  Admitting Physician: Collier Bullock [[id number]]  Attending Physician: Collier Bullock [[id number]]  Estimated length of stay: 3 - 4 days  Certification:: I certify this patient will need inpatient services for at least 2 midnights  PT Class (Do Not Modify): Inpatient [101]  PT Acc Code (Do Not Modify): Private [1]       B Medical/Surgery History Past Medical History:  Diagnosis Date  . Abdominal mass of other site   . Cervical compression fracture ([place])   . CHF (congestive heart failure) ([place])   . Chronic kidney disease, stage 3 (HCC)    Borderline Stage 2-3  . Chronic lower limb pain   . COPD (chronic obstructive pulmonary disease) ([place])   . CTS (carpal tunnel syndrome)   . Depression   . Diabetes ([place])    Type II  . Fibromyalgia   . Hepatitis C    cured last year (2018)  . Hypercholesteremia   . Hypertension   . Hypothyroidism   . Morbid  obesity ([place])   . Neuropathy    Diabetes  . OSA (obstructive sleep apnea)   . Osteoarthritis   . Renal cancer ([place])    Left Kidney Removed  . RLS (restless legs syndrome)   . Syncope   . Venous stasis    Past Surgical History:  Procedure Laterality Date  . ABDOMINAL HERNIA REPAIR     x2  . ABDOMINAL HYSTERECTOMY    . BLADDER SUSPENSION    . CHOLECYSTECTOMY    . CYST EXCISION     Head  . INCISIONAL HERNIA REPAIR    . KNEE ARTHROSCOPY     Bilateral  . NEPHRECTOMY     Left  . TONSILLECTOMY    . TOOTH EXTRACTION    . UMBILICAL HERNIA REPAIR    . VAGINA SURGERY    . WISDOM TOOTH EXTRACTION       A IV Location/Drains/Wounds Patient Lines/Drains/Airways Status   Active Line/Drains/Airways    Name:   Placement date:   Placement time:   Site:   Days:   Peripheral IV 10/12/2018 Right Antecubital   09/19/2018    2216    Antecubital   1   Peripheral IV 09/21/18 Left Hand   09/21/18    0013    Hand   less than 1   Urethral Catheter Hulan Amato RN Latex 14 Fr.   10/01/2018    2258  Latex   1   External Urinary Catheter   09/12/18    1037    -   9   Pressure Injury 08/28/18 Stage II -  Partial thickness loss of dermis presenting as a shallow open ulcer with a red, pink wound bed without slough.   08/28/18    2230     24   Pressure Injury 08/28/18 Stage II -  Partial thickness loss of dermis presenting as a shallow open ulcer with a red, pink wound bed without slough.   08/28/18    2230     24   Pressure Injury 09/12/18 Stage II -  Partial thickness loss of dermis presenting as a shallow open ulcer with a red, pink wound bed without slough.   09/12/18    1300     9   Wound / Incision (Open or Dehisced) 07/15/18 Venous stasis ulcer Pretibial Right   07/15/18    0130    Pretibial   68          Intake/Output Last 24 hours No intake or output data in the 24 hours ending 09/21/18 0016  Labs/Imaging Results for orders placed or performed during the hospital encounter of 10/03/2018 (from the  past 48 hour(s))  SARS Coronavirus 2 (CEPHEID- Performed in [facility] hospital lab), Hosp Order     Status: None   Collection Time: 09/24/2018  9:54 PM  Result Value Ref Range   SARS Coronavirus 2 NEGATIVE NEGATIVE    Comment: (NOTE) If result is NEGATIVE SARS-CoV-2 target nucleic acids are NOT DETECTED. The SARS-CoV-2 RNA is generally detectable in upper and lower  respiratory specimens during the acute phase of infection. The lowest  concentration of SARS-CoV-2 viral copies this assay can detect is 250  copies / mL. A negative result does not preclude SARS-CoV-2 infection  and should not be used as the sole basis for treatment or other  patient management decisions.  A negative result may occur with  improper specimen collection / handling, submission of specimen other  than nasopharyngeal swab, presence of viral mutation(s) within the  areas targeted by this assay, and inadequate number of viral copies  (<250 copies / mL). A negative result must be combined with clinical  observations, patient history, and epidemiological information. If result is POSITIVE SARS-CoV-2 target nucleic acids are DETECTED. The SARS-CoV-2 RNA is generally detectable in upper and lower  respiratory specimens dur ing the acute phase of infection.  Positive  results are indicative of active infection with SARS-CoV-2.  Clinical  correlation with patient history and other diagnostic information is  necessary to determine patient infection status.  Positive results do  not rule out bacterial infection or co-infection with other viruses. If result is PRESUMPTIVE POSTIVE SARS-CoV-2 nucleic acids MAY BE PRESENT.   A presumptive positive result was obtained on the submitted specimen  and confirmed on repeat testing.  While 2019 novel coronavirus  (SARS-CoV-2) nucleic acids may be present in the submitted sample  additional confirmatory testing may be necessary for epidemiological  and / or clinical management  purposes  to differentiate between  SARS-CoV-2 and other Sarbecovirus currently known to infect humans.  If clinically indicated additional testing with an alternate test  methodology [phone]) is advised. The SARS-CoV-2 RNA is generally  detectable in upper and lower respiratory sp ecimens during the acute  phase of infection. The expected result is Negative. Fact Sheet for Patients:  StrictlyIdeas.no Fact Sheet for Healthcare Providers: BankingDealers.co.za This test is  not yet approved or cleared by the [place] and has been authorized for detection and/or diagnosis of SARS-CoV-2 by FDA under an Emergency Use Authorization (EUA).  This EUA will remain in effect (meaning this test can be used) for the duration of the COVID-19 declaration under Section 564(b)(1) of the Act, 21 U.S.C. section 360bbb-3(b)(1), unless the authorization is terminated or revoked sooner. Performed at [facility], [place] [address], [place], [place] [address]   Comprehensive metabolic panel     Status: Abnormal   Collection Time: 10/06/2018  9:57 PM  Result Value Ref Range   Sodium 136 135 - 145 mmol/L   Potassium 7.2 (HH) 3.5 - 5.1 mmol/L    Comment: NO VISIBLE HEMOLYSIS CRITICAL RESULT CALLED TO, READ BACK BY AND VERIFIED WITH: NEWNAM K,RN 10/05/2018 2305 WAYK    Chloride 86 (L) 98 - 111 mmol/L   CO2 41 (H) 22 - 32 mmol/L   Glucose, Bld 204 (H) 70 - 99 mg/dL   BUN 66 (H) 8 - 23 mg/dL   Creatinine, Ser 1.60 (H) 0.44 - 1.00 mg/dL   Calcium 8.9 8.9 - 10.3 mg/dL   Total Protein 5.9 (L) 6.5 - 8.1 g/dL   Albumin 3.2 (L) 3.5 - 5.0 g/dL   AST 38 15 - 41 U/L   ALT 59 (H) 0 - 44 U/L   Alkaline Phosphatase 161 (H) 38 - 126 U/L   Total Bilirubin 0.7 0.3 - 1.2 mg/dL   GFR calc non Af Amer 33 (L) >60 mL/min   GFR calc Af Amer 39 (L) >60 mL/min   Anion gap 9 5 - 15    Comment: Performed at [facility], [place] [address] [place], [place] [address]   Troponin I - Once     Status: Abnormal   Collection Time: 09/16/2018  9:57 PM  Result Value Ref Range   Troponin I 0.03 (HH) <0.03 ng/mL    Comment: CRITICAL RESULT CALLED TO, READ BACK BY AND VERIFIED WITH: NEWNAM K,RN 10/02/2018 [place] Performed at [place] [address], [place], [place] [address]   CBC with Differential     Status: Abnormal   Collection Time: 10/04/2018  9:57 PM  Result Value Ref Range   WBC 6.2 4.0 - 10.5 K/uL   RBC 3.33 (L) 3.87 - 5.11 MIL/uL   Hemoglobin 8.1 (L) 12.0 - 15.0 g/dL   HCT 30.5 (L) 36.0 - 46.0 %   MCV 91.6 80.0 - 100.0 fL   MCH 24.3 (L) 26.0 - 34.0 pg   MCHC 26.6 (L) 30.0 - 36.0 g/dL   RDW 19.3 (H) 11.5 - 15.5 %   Platelets 92 (L) 150 - 400 K/uL    Comment: REPEATED TO VERIFY PLATELET COUNT CONFIRMED BY SMEAR SPECIMEN CHECKED FOR CLOTS Immature Platelet Fraction may be clinically indicated, consider ordering this additional test [id number]    nRBC 0.0 0.0 - 0.2 %   Neutrophils Relative % 87 %   Neutro Abs 5.4 1.7 - 7.7 K/uL   Lymphocytes Relative 6 %   Lymphs Abs 0.4 (L) 0.7 - 4.0 K/uL   Monocytes Relative 6 %   Monocytes Absolute 0.4 0.1 - 1.0 K/uL   Eosinophils Relative 0 %   Eosinophils Absolute 0.0 0.0 - 0.5 K/uL   Basophils Relative 0 %   Basophils Absolute 0.0 0.0 - 0.1 K/uL   Immature Granulocytes 1 %   Abs Immature Granulocytes 0.04 0.00 - 0.07 K/uL  Comment: Performed at [facility], [place] [address] [place], [place] [address]  Brain natriuretic peptide     Status: Abnormal   Collection Time: 09/30/2018  9:57 PM  Result Value Ref Range   B Natriuretic Peptide 1,090.4 (H) 0.0 - 100.0 pg/mL    Comment: Performed at [place] [facility] [address] [place], [place] [address]  Protime-INR     Status: Abnormal   Collection Time: 10/08/2018  9:57 PM  Result Value Ref Range   Prothrombin Time 15.9 (H) 11.4 - 15.2 seconds   INR 1.3 (H) 0.8 - 1.2    Comment: (NOTE) INR goal varies based on device and disease  states. Performed at [facility], [place] [address] [place], [place] [address]   Lactic acid, plasma     Status: None   Collection Time: 10/11/2018  9:57 PM  Result Value Ref Range   Lactic Acid, Venous 1.5 0.5 - 1.9 mmol/L    Comment: Performed at [place] [address] [place], [place] [address]  I-stat Creatinine, ED     Status: Abnormal   Collection Time: 09/26/2018 10:05 PM  Result Value Ref Range   Creatinine, Ser 1.90 (H) 0.44 - 1.00 mg/dL  POCT I-Stat EG7     Status: Abnormal   Collection Time: 10/09/2018 10:05 PM  Result Value Ref Range   pH, Ven 7.301 7.250 - 7.430   pCO2, Ven 93.9 (HH) 44.0 - 60.0 mmHg   pO2, Ven 29.0 (LL) 32.0 - 45.0 mmHg   Bicarbonate 46.3 (H) 20.0 - 28.0 mmol/L   TCO2 49 (H) 22 - 32 mmol/L   O2 Saturation 45.0 %   Acid-Base Excess 17.0 (H) 0.0 - 2.0 mmol/L   Sodium 131 (L) 135 - 145 mmol/L   Potassium 6.9 (HH) 3.5 - 5.1 mmol/L   Calcium, Ion 1.09 (L) 1.15 - 1.40 mmol/L   HCT 29.0 (L) 36.0 - 46.0 %   Hemoglobin 9.9 (L) 12.0 - 15.0 g/dL   Patient temperature HIDE    Sample type VENOUS    Comment NOTIFIED PHYSICIAN    Dg Chest Port 1 View  Result Date: 09/19/2018 CLINICAL DATA:  Shortness of breath EXAM: PORTABLE CHEST 1 VIEW COMPARISON:  09/11/2018 FINDINGS: Cardiomegaly with vascular congestion and bilateral airspace opacities concerning for edema/CHF. Layering bilateral effusions. No acute bony abnormality. IMPRESSION: Moderate CHF.  Layering bilateral effusions. Electronically Signed   By: Rolm Baptise M.D.   On: 10/03/2018 22:38    Pending Labs Unresulted Labs (From admission, onward)    Start     Ordered   09/21/18 0500  CBC  Tomorrow morning,   R     09/28/2018 2358   09/21/18 0500  Magnesium  Tomorrow morning,   R     09/18/2018 2358   09/21/18 0500  Phosphorus  Tomorrow morning,   R     09/28/2018 2358   09/21/18 6160  Basic metabolic panel  Tomorrow morning,   R     10/09/2018 2358   09/21/18 0026  Glucose, random  (Severe  hyperkalemia - Potassium  > 7 mEq/L with or without ECG changes)  Once,   R    Comments:  1 hour after insulin administered.    09/17/2018 2310   10/13/2018 2151  Culture, blood (routine x 2)  BLOOD CULTURE X 2,   STAT     10/14/2018 2150   09/23/2018 2151  Lactic acid, plasma  Now then every 2  hours,   STAT     09/28/2018 2150          Vitals/Pain Today's Vitals   09/26/2018 2300 09/26/2018 2322 10/14/2018 2330 09/21/18 0000  BP: 123/83  (!) 115/55 (!) 103/58  Pulse: 89 87 88 68  Resp: 16 19 14 16   Temp:      TempSrc:      SpO2: 95% 99% 95% 93%  PainSc:        Isolation Precautions Droplet and Contact precautions  Medications Medications  AeroChamber Plus Flo-Vu Large MISC (has no administration in time range)  calcium gluconate 1 g/ 50 mL sodium chloride IVPB (1,000 mg Intravenous New Bag/Given 09/21/18 0013)  heparin injection 5,000 Units (has no administration in time range)  albuterol (VENTOLIN HFA) 108 (90 Base) MCG/ACT inhaler 8 puff (8 puffs Inhalation Given 10/13/2018 2309)  furosemide (LASIX) injection 40 mg (40 mg Intravenous Given 10/01/2018 2258)  methylPREDNISolone sodium succinate (SOLU-MEDROL) 125 mg/2 mL injection 125 mg (125 mg Intravenous Given 10/11/2018 2258)  insulin aspart (novoLOG) injection 5 Units (5 Units Intravenous Given 09/21/18 0004)    And  dextrose 50 % solution 50 mL (50 mLs Intravenous Given 09/21/18 0004)  dextrose 10 % infusion ( Intravenous New Bag/Given 09/21/18 0012)  sodium bicarbonate injection 50 mEq (50 mEq Intravenous Given 09/21/18 0004)    Mobility non-ambulatory     Focused Assessments Pulmonary Assessment Handoff:  Lung sounds: Bilateral Breath Sounds: Diminished L Breath Sounds: Fine crackles R Breath Sounds: Fine crackles O2 Device: Bi-PAP O2 Flow Rate (L/min): 4 L/min   ,    R Recommendations: See Admitting Provider Note  Report given to:   Additional Notes:

## 2018-09-21 NOTE — Progress Notes (Signed)
In the process of attempting to give Kayexalate Enema, stool had to be disimpacted first but RN noted blood coming out from rectal area. Elink MD notified. Will continue monitor for bleeding throughout shift.

## 2018-09-21 NOTE — ED Notes (Signed)
Dr. Ralene Bathe notified of critical high potassium and Troponin.

## 2018-09-21 NOTE — Progress Notes (Signed)
Patient hallucinating, provider made aware, advises to put back on BIPAP when can, Kayexalate given, per respiratory therapist will put back on BIPAP in one hour.

## 2018-09-21 NOTE — Progress Notes (Signed)
  Echocardiogram 2D Echocardiogram has been performed.  Renee Noble 09/21/2018, 8:30 AM

## 2018-09-21 NOTE — Progress Notes (Signed)
CRITICAL VALUE ALERT  Critical Value:  Potassium   Date & Time Notied: 09/21/18  04:30  Provider Notified: Warren Lacy MD   Orders Received/Actions taken: Orders given

## 2018-09-21 NOTE — Progress Notes (Addendum)
Patient off bipap prior to arrival. Patient is tolerating well on 6L [place]. Vitals are stable. Bipap on standby if needed. RT will continue to monitor.

## 2018-09-21 NOTE — Progress Notes (Signed)
RT obtained ABG on pt with the following results. MD Ogan notified of pts critical PCO2 of 91.0. No BIPAP changes at this time. MD would like pt to remain on continuous BIPAP for the night and reassess in the morning. RT will continue to monitor.   Results for Renee Noble, Renee Noble (MRN [id number]) as of 09/21/2018 21:39  Ref. Range 09/21/2018 21:25  Sample type Unknown ARTERIAL DRAW  Delivery systems Unknown BILEVEL POSITIVE AIRWAY PRESSURE  FIO2 Unknown 50.00  Inspiratory PAP Unknown 20  Expiratory PAP Unknown 5  pH, Arterial Latest Ref Range: 7.350 - 7.450  7.301 (L)  pCO2 arterial Latest Ref Range: 32.0 - 48.0 mmHg 91.0 (HH)  pO2, Arterial Latest Ref Range: 83.0 - 108.0 mmHg 101  Acid-Base Excess Latest Ref Range: 0.0 - 2.0 mmol/L 16.6 (H)  Bicarbonate Latest Ref Range: 20.0 - 28.0 mmol/L 43.7 (H)  O2 Saturation Latest Units: % 97.9  Patient temperature Unknown 98.2  Collection site Unknown RIGHT RADIAL  Allens test (pass/fail) Latest Ref Range: PASS  PASS

## 2018-09-22 LAB — MAGNESIUM: Magnesium: 2 mg/dL (ref 1.7–2.4)

## 2018-09-22 LAB — POTASSIUM
Potassium: 6.2 mmol/L — ABNORMAL HIGH (ref 3.5–5.1)
Potassium: 5.6 mmol/L — ABNORMAL HIGH (ref 3.5–5.1)
Potassium: 5.3 mmol/L — ABNORMAL HIGH (ref 3.5–5.1)
Potassium: 5.5 mmol/L — ABNORMAL HIGH (ref 3.5–5.1)

## 2018-09-22 LAB — COMPREHENSIVE METABOLIC PANEL
ALT: 84 U/L — ABNORMAL HIGH (ref 0–44)
AST: 62 U/L — ABNORMAL HIGH (ref 15–41)
Albumin: 3 g/dL — ABNORMAL LOW (ref 3.5–5.0)
Alkaline Phosphatase: 181 U/L — ABNORMAL HIGH (ref 38–126)
Anion gap: 13 (ref 5–15)
BUN: 78 mg/dL — ABNORMAL HIGH (ref 8–23)
CO2: 42 mmol/L — ABNORMAL HIGH (ref 22–32)
Calcium: 8.9 mg/dL (ref 8.9–10.3)
Chloride: 83 mmol/L — ABNORMAL LOW (ref 98–111)
Creatinine, Ser: 1.51 mg/dL — ABNORMAL HIGH (ref 0.44–1.00)
GFR calc Af Amer: 42 mL/min — ABNORMAL LOW (ref >60)
GFR calc non Af Amer: 36 mL/min — ABNORMAL LOW (ref >60)
Glucose, Bld: 187 mg/dL — ABNORMAL HIGH (ref 70–99)
Potassium: 5.4 mmol/L — ABNORMAL HIGH (ref 3.5–5.1)
Sodium: 138 mmol/L (ref 135–145)
Total Bilirubin: 0.6 mg/dL (ref 0.3–1.2)
Total Protein: 5.5 g/dL — ABNORMAL LOW (ref 6.5–8.1)

## 2018-09-22 LAB — BASIC METABOLIC PANEL WITH GFR
Anion gap: 10 (ref 5–15)
BUN: 81 mg/dL — ABNORMAL HIGH (ref 8–23)
CO2: 42 mmol/L — ABNORMAL HIGH (ref 22–32)
Calcium: 8.7 mg/dL — ABNORMAL LOW (ref 8.9–10.3)
Chloride: 83 mmol/L — ABNORMAL LOW (ref 98–111)
Creatinine, Ser: 1.55 mg/dL — ABNORMAL HIGH (ref 0.44–1.00)
GFR calc Af Amer: 40 mL/min — ABNORMAL LOW
GFR calc non Af Amer: 35 mL/min — ABNORMAL LOW
Glucose, Bld: 132 mg/dL — ABNORMAL HIGH (ref 70–99)
Potassium: 5.2 mmol/L — ABNORMAL HIGH (ref 3.5–5.1)
Sodium: 135 mmol/L (ref 135–145)

## 2018-09-22 LAB — CBC
HCT: 26.2 % — ABNORMAL LOW (ref 36.0–46.0)
Hemoglobin: 7.3 g/dL — ABNORMAL LOW (ref 12.0–15.0)
MCH: 24.5 pg — ABNORMAL LOW (ref 26.0–34.0)
MCHC: 27.9 g/dL — ABNORMAL LOW (ref 30.0–36.0)
MCV: 87.9 fL (ref 80.0–100.0)
Platelets: 65 10*3/uL — ABNORMAL LOW (ref 150–400)
RBC: 2.98 MIL/uL — ABNORMAL LOW (ref 3.87–5.11)
RDW: 19.6 % — ABNORMAL HIGH (ref 11.5–15.5)
WBC: 3.1 10*3/uL — ABNORMAL LOW (ref 4.0–10.5)
nRBC: 0 % (ref 0.0–0.2)

## 2018-09-22 LAB — GLUCOSE, CAPILLARY
Glucose-Capillary: 203 mg/dL — ABNORMAL HIGH (ref 70–99)
Glucose-Capillary: 183 mg/dL — ABNORMAL HIGH (ref 70–99)
Glucose-Capillary: 165 mg/dL — ABNORMAL HIGH (ref 70–99)
Glucose-Capillary: 171 mg/dL — ABNORMAL HIGH (ref 70–99)
Glucose-Capillary: 135 mg/dL — ABNORMAL HIGH (ref 70–99)
Glucose-Capillary: 189 mg/dL — ABNORMAL HIGH (ref 70–99)

## 2018-09-22 LAB — BLOOD CULTURE ID PANEL (REFLEXED)

## 2018-09-22 LAB — FERRITIN: Ferritin: 39 ng/mL (ref 11–307)

## 2018-09-22 LAB — IRON AND TIBC
Iron: 16 ug/dL — ABNORMAL LOW (ref 28–170)
Saturation Ratios: 5 % — ABNORMAL LOW (ref 10.4–31.8)
TIBC: 328 ug/dL (ref 250–450)
UIBC: 312 ug/dL

## 2018-09-22 LAB — PHOSPHORUS: Phosphorus: 5.7 mg/dL — ABNORMAL HIGH (ref 2.5–4.6)

## 2018-09-22 MED ORDER — DILTIAZEM HCL ER COATED BEADS 180 MG PO CP24
180.0000 mg | ORAL_CAPSULE | Freq: Every day | ORAL | Status: DC
  Administered 2018-09-22 – 2018-09-28 (×7): 180 mg via ORAL
  Filled 2018-09-22 (×7): qty 1

## 2018-09-22 MED ORDER — LEVOTHYROXINE SODIUM 88 MCG PO TABS
88.0000 ug | ORAL_TABLET | Freq: Every day | ORAL | Status: DC
  Administered 2018-09-23 – 2018-10-04 (×11): 88 ug via ORAL
  Filled 2018-09-22 (×12): qty 1

## 2018-09-22 MED ORDER — PRAVASTATIN SODIUM 10 MG PO TABS
20.0000 mg | ORAL_TABLET | Freq: Every day | ORAL | Status: DC
  Administered 2018-09-22 – 2018-10-03 (×12): 20 mg via ORAL
  Filled 2018-09-22 (×12): qty 2

## 2018-09-22 MED ORDER — DM-GUAIFENESIN ER 30-600 MG PO TB12
1.0000 | ORAL_TABLET | Freq: Two times a day (BID) | ORAL | Status: DC | PRN
  Administered 2018-09-23: 1 via ORAL
  Filled 2018-09-22 (×2): qty 1

## 2018-09-22 MED ORDER — FUROSEMIDE 10 MG/ML IJ SOLN
40.0000 mg | Freq: Once | INTRAMUSCULAR | Status: AC
  Administered 2018-09-22: 40 mg via INTRAVENOUS
  Filled 2018-09-22: qty 4

## 2018-09-22 MED ORDER — MOMETASONE FURO-FORMOTEROL FUM 200-5 MCG/ACT IN AERO
2.0000 | INHALATION_SPRAY | Freq: Two times a day (BID) | RESPIRATORY_TRACT | Status: DC
  Administered 2018-09-22 – 2018-10-05 (×20): 2 via RESPIRATORY_TRACT
  Filled 2018-09-22 (×2): qty 8.8

## 2018-09-22 MED ORDER — FUROSEMIDE 10 MG/ML IJ SOLN
80.0000 mg | Freq: Two times a day (BID) | INTRAMUSCULAR | Status: DC
  Administered 2018-09-22 – 2018-09-24 (×4): 80 mg via INTRAVENOUS
  Filled 2018-09-22 (×5): qty 8

## 2018-09-22 MED ORDER — FUROSEMIDE 10 MG/ML IJ SOLN
60.0000 mg | Freq: Once | INTRAMUSCULAR | Status: AC
  Administered 2018-09-22: 60 mg via INTRAVENOUS
  Filled 2018-09-22: qty 6

## 2018-09-22 MED ORDER — IPRATROPIUM-ALBUTEROL 0.5-2.5 (3) MG/3ML IN SOLN
3.0000 mL | RESPIRATORY_TRACT | Status: DC | PRN
  Administered 2018-09-24 – 2018-10-05 (×6): 3 mL via RESPIRATORY_TRACT
  Filled 2018-09-22 (×8): qty 3

## 2018-09-22 MED ORDER — INSULIN ASPART 100 UNIT/ML ~~LOC~~ SOLN: 0.0000 [IU] | Freq: Every day | SUBCUTANEOUS | Status: DC

## 2018-09-22 MED ORDER — INSULIN ASPART 100 UNIT/ML ~~LOC~~ SOLN
0.0000 [IU] | Freq: Three times a day (TID) | SUBCUTANEOUS | Status: DC
  Administered 2018-09-22: 2 [IU] via SUBCUTANEOUS
  Administered 2018-09-22: 3 [IU] via SUBCUTANEOUS
  Administered 2018-09-23: 2 [IU] via SUBCUTANEOUS
  Administered 2018-09-23: 3 [IU] via SUBCUTANEOUS
  Administered 2018-09-23: 2 [IU] via SUBCUTANEOUS
  Administered 2018-09-24 (×3): 3 [IU] via SUBCUTANEOUS
  Administered 2018-09-25 – 2018-09-26 (×5): 2 [IU] via SUBCUTANEOUS
  Administered 2018-09-27: 1 [IU] via SUBCUTANEOUS
  Administered 2018-09-28 (×2): 2 [IU] via SUBCUTANEOUS
  Administered 2018-09-29: 5 [IU] via SUBCUTANEOUS

## 2018-09-22 MED ORDER — ALBUTEROL SULFATE (2.5 MG/3ML) 0.083% IN NEBU
3.0000 mL | INHALATION_SOLUTION | Freq: Four times a day (QID) | RESPIRATORY_TRACT | Status: DC | PRN
  Administered 2018-09-24 – 2018-10-05 (×2): 3 mL via RESPIRATORY_TRACT
  Filled 2018-09-22 (×2): qty 3

## 2018-09-22 NOTE — Progress Notes (Signed)
PHARMACY - PHYSICIAN COMMUNICATION CRITICAL VALUE ALERT - BLOOD CULTURE IDENTIFICATION (BCID)  Renee Noble is an 63 y.o. female who presented to [facility] on 10/13/[facility]    Name of physician (or Provider) Contacted: Dr. Lucile Shutters  Current antibiotics: None  Changes to prescribed antibiotics recommended:  Cont to monitor off anti-biotics  Results for orders placed or performed during the hospital encounter of 09/29/2018  Blood Culture ID Panel (Reflexed) (Collected: 10/14/2018 10:05 PM)  Result Value Ref Range   Enterococcus species NOT DETECTED NOT DETECTED   Listeria monocytogenes NOT DETECTED NOT DETECTED   Staphylococcus species DETECTED (A) NOT DETECTED   Staphylococcus aureus (BCID) NOT DETECTED NOT DETECTED   Methicillin resistance DETECTED (A) NOT DETECTED   Streptococcus species NOT DETECTED NOT DETECTED   Streptococcus agalactiae NOT DETECTED NOT DETECTED   Streptococcus pneumoniae NOT DETECTED NOT DETECTED   Streptococcus pyogenes NOT DETECTED NOT DETECTED   Acinetobacter baumannii NOT DETECTED NOT DETECTED   Enterobacteriaceae species NOT DETECTED NOT DETECTED   Enterobacter cloacae complex NOT DETECTED NOT DETECTED   Escherichia coli NOT DETECTED NOT DETECTED   Klebsiella oxytoca NOT DETECTED NOT DETECTED   Klebsiella pneumoniae NOT DETECTED NOT DETECTED   Proteus species NOT DETECTED NOT DETECTED   Serratia marcescens NOT DETECTED NOT DETECTED   Haemophilus influenzae NOT DETECTED NOT DETECTED   Neisseria meningitidis NOT DETECTED NOT DETECTED   Pseudomonas aeruginosa NOT DETECTED NOT DETECTED   Candida albicans NOT DETECTED NOT DETECTED   Candida glabrata NOT DETECTED NOT DETECTED   Candida krusei NOT DETECTED NOT DETECTED   Candida parapsilosis NOT DETECTED NOT DETECTED   Candida tropicalis NOT DETECTED NOT DETECTED    [name] 09/22/2018  6:58 AM

## 2018-09-22 NOTE — Progress Notes (Signed)
Pt declining BPAP at this time. Stated "I will let them know when I am ready to get back on"

## 2018-09-22 NOTE — Progress Notes (Signed)
Pt on continuous BIPAP v60 currently. Pt resting comfortably and with decreased WOB. RT will continue to monitor.

## 2018-09-22 NOTE — Progress Notes (Signed)
[facility]    NEPHROLOGY PROGRESS NOTE  SUBJECTIVE: Still complains of shortness of breath, but reports she is better than yesterday.  Denies any headaches, fevers, chills, dysuria, hematuria, or skin rash.  Does feel the constant urge to urinate due to the Foley.  Denies any skin rash, arthralgias or myalgias.  All other review of systems are negative.  OBJECTIVE:  Vitals:   09/22/18 1118 09/22/18 1200  BP:  104/69  Pulse:  63  Resp:  15  Temp: 97.9 F (36.6 C)   SpO2:  98%    Intake/Output Summary (Last 24 hours) at 09/22/2018 1322 Last data filed at 09/22/2018 1200 Gross per 24 hour  Intake 1190.26 ml  Output 1087 ml  Net 103.26 ml      General:  AAOx3 NAD HEENT: MMM [place] AT anicteric sclera Neck:  No JVD, no adenopathy CV:  Heart RRR  Lungs:  L/S with bibasilar Rales Abd:  abd SNT/ND with normal BS, obese GU:  Bladder non-palpable Extremities: +2 bilateral lower extremity edema Skin:  No skin rash  MEDICATIONS:  . chlorhexidine  15 mL Mouth Rinse BID  . diltiazem  180 mg Oral Daily  . heparin  5,000 Units Subcutaneous Q8H  . insulin aspart  0-15 Units Subcutaneous TID WC  . insulin aspart  0-5 Units Subcutaneous QHS  . insulin detemir  10 Units Subcutaneous BID  . [START ON 09/23/2018] levothyroxine  88 mcg Oral Q0600  . mouth rinse  15 mL Mouth Rinse q12n4p  . mometasone-formoterol  2 puff Inhalation BID  . pravastatin  20 mg Oral Daily  . sodium polystyrene  30 g Oral Once       LABS:   CBC Latest Ref Rng & Units 09/22/2018 09/21/2018 09/23/2018  WBC 4.0 - 10.5 K/uL 3.1(L) 4.0 -  Hemoglobin 12.0 - 15.0 g/dL 7.3(L) 8.0(L) 9.9(L)  Hematocrit 36.0 - 46.0 % 26.2(L) 28.8(L) 29.0(L)  Platelets 150 - 400 K/uL 65(L) 57(L) -    CMP Latest Ref Rng & Units 09/22/2018 09/22/2018 09/22/2018  Glucose 70 - 99 mg/dL - - 187(H)  BUN 8 - 23 mg/dL - - 78(H)  Creatinine 0.44 - 1.00 mg/dL - - 1.51(H)  Sodium 135 - 145 mmol/L - - 138  Potassium 3.5 - 5.1 mmol/L 5.5(H)  5.3(H) 5.4(H)  Chloride 98 - 111 mmol/L - - 83(L)  CO2 22 - 32 mmol/L - - 42(H)  Calcium 8.9 - 10.3 mg/dL - - 8.9  Total Protein 6.5 - 8.1 g/dL - - 5.5(L)  Total Bilirubin 0.3 - 1.2 mg/dL - - 0.6  Alkaline Phos 38 - 126 U/L - - 181(H)  AST 15 - 41 U/L - - 62(H)  ALT 0 - 44 U/L - - 84(H)    Lab Results  Component Value Date   CALCIUM 8.9 09/22/2018   CAION 1.09 (L) 09/25/2018   PHOS 5.7 (H) 09/22/2018       Component Value Date/Time   COLORURINE YELLOW 08/29/2018 0950   APPEARANCEUR CLEAR 08/29/2018 0950   LABSPEC 1.014 08/29/2018 0950   PHURINE 5.0 08/29/2018 0950   GLUCOSEU NEGATIVE 08/29/2018 0950   GLUCOSEU NEGATIVE 08/17/2016 1340   HGBUR SMALL (A) 08/29/2018 0950   BILIRUBINUR NEGATIVE 08/29/2018 0950   BILIRUBINUR neg 01/15/2015 1353   [place] 08/29/2018 0950   PROTEINUR 30 (A) 08/29/2018 0950   UROBILINOGEN 1.0 08/17/2016 1340   NITRITE NEGATIVE 08/29/2018 0950   LEUKOCYTESUR NEGATIVE 08/29/2018 0950      Component Value  Date/Time   PHART 7.301 (L) 09/21/2018 2125   PCO2ART 91.0 (HH) 09/21/2018 2125   PO2ART 101 09/21/2018 2125   HCO3 43.7 (H) 09/21/2018 2125   TCO2 49 (H) 09/22/2018 2205   O2SAT 97.9 09/21/2018 2125       Component Value Date/Time   IRON 19 (L) 09/14/2018 1014   TIBC 281 09/14/2018 1014   FERRITIN 35 09/14/2018 1014   IRONPCTSAT 7 (L) 09/14/2018 1014       ASSESSMENT/PLAN:    63 year old female patient with a past medical history significant for chronic kidney disease stage III with a baseline creatinine of 1.3-1.6, diabetes, COPD, and congestive heart failure who presented with shortness of breath and acute on chronic hypercarbic respiratory failure.  She had a recent acute kidney injury event with a peak creatinine of 2.82 on 4/16.  She was markedly hyperkalemic with a potassium of 7.2 on admission, and was on potassium supplementation at home.  1.  Hyperkalemia.  Likely secondary to acute kidney injury and potassium  supplementation.  Status post IV Lasix.  Did not tolerate Kayexalate enema.  Potassium has improved.  Cortisol level pending.  2.  Chronic kidney disease stage III.  Her baseline serum creatinine runs around 1.3-1.6.  She is status post nephrectomy with a solitary kidney.  Suspect related to underlying diabetes.  3.  Acute on chronic hypercarbic respiratory failure with hypoxia.  She has underlying severe COPD.  She continues BiPAP intermittently.  Continue to diurese as able.  CT of the chest is pending.  Chest x-ray from 5/6 revealed vascular congestion and bilateral airspace opacities  4.  Chronic diastolic congestive heart failure.  She appears to be fluid overloaded.  Agree with continued diuresis.  Urinalysis from 08/29/2018 with minimal proteinuria and small blood.  5.  Atrial fibrillation.  On diltiazem.  Not on oral anticoagulation reportedly due to chronic thrombocytopenia     [employer], DO, FACP

## 2018-09-22 NOTE — Progress Notes (Addendum)
NAME:  Renee Noble, MRN:  [id number], DOB:  [birth year], LOS: 0 ADMISSION DATE:  09/26/2018, CONSULTATION DATE:  09/21/2018 REFERRING MD:  Dr. Ralene Bathe, CHIEF COMPLAINT:  AMS  Brief History   63 yo female from SNF with altered mental status, acute on chronic hypercapnia with acidosis and hyperkalemia.     Past Medical History  Severe COPD, chronic respiratory failure on 3 liters oxygen, diastolic CHF, DM II, Hypothyroidism, OSA, Depression, Chronic pain  Significant Hospital Events   4/13- 5/2 hospitalized 5/07 Admitted 5/08 transfer to progressive care  Consults:  Renal 5/07   Procedures:    Significant Diagnostic Tests:    Micro Data:  5/6 BC x 2 >> 5/6 COVID >> neg  Antimicrobials:    Interim history/subjective:  Feels weak from deconditioning.  Denies chest pain, abdominal pain, or dyspnea.  Objective   BP (!) 127/56 (BP Location: Right Arm)   Pulse (!) 105   Temp 98.3 F (36.8 C) (Axillary)   Resp 15   Wt (!) 136.4 kg   SpO2 97%   BMI 51.62 kg/m   I/O last 3 completed shifts: In: 950.6 [I.V.:354.5; IV Piggyback:596] Out: 2052 [Urine:2052]  Examination:  General - alert Eyes - pupils reactive ENT - no sinus tenderness, no stridor Cardiac - regular rate/rhythm, no murmur Chest - equal breath sounds b/l, no wheezing or rales Abdomen - soft, non tender, + bowel sounds Extremities - 1+ edema Skin - no rashes Neuro - follows commands  Resolved Hospital Problem list    Assessment & Plan:   Hyperkalemia. CKD 3. Discussion: Likely in setting of respiratory acidosis. Plan - f/u BMET - renal consulted  Acute on chronic hypoxic/hypercapnic respiratory failure. Severe COPD. OSA/OHS. Plan - dulera, prn abluterol/duoneb - oxygen to keep SpO2 88 to 95% - Bipap qhs and prn  Permanent atrial fibrillation. Acute on chronic diastolic CHF. Hx of HLD. Plan - cardizem, pravachol - lasix 40 mg IV x one 5/08 - not candidate for oral  anticoagulation reportedly due to chronic thrombocytopenia  RUL pulmonary nodule. Plan - f/u CT chest in July 2020 as outpt  DM type II. Hx of hypothyroidism. Plan - SSI with levemir - continue synthroid  Anemia, thrombocytopenia of chronic disease and iron deficiency. Plan - f/u CBC - repeat iron levels  Acute metabolic encephalopathy. Hx of depression, chronic pain. Plan - monitor mental status - hold outpt elavil, MS contin for now  Deconditioning. Plan - PT/OT   Best practice:  Diet: renal heart healthy DVT prophylaxis: heparin SQ/ SCDs GI prophylaxis: n/a Mobility: OOB Code Status: full Disposition: transfer to progressive care.  To [facility] 5/09 and PCCM off.  Labs    CMP Latest Ref Rng & Units 09/22/2018 09/22/2018 09/22/2018  Glucose 70 - 99 mg/dL - - 187(H)  BUN 8 - 23 mg/dL - - 78(H)  Creatinine 0.44 - 1.00 mg/dL - - 1.51(H)  Sodium 135 - 145 mmol/L - - 138  Potassium 3.5 - 5.1 mmol/L 5.5(H) 5.3(H) 5.4(H)  Chloride 98 - 111 mmol/L - - 83(L)  CO2 22 - 32 mmol/L - - 42(H)  Calcium 8.9 - 10.3 mg/dL - - 8.9  Total Protein 6.5 - 8.1 g/dL - - 5.5(L)  Total Bilirubin 0.3 - 1.2 mg/dL - - 0.6  Alkaline Phos 38 - 126 U/L - - 181(H)  AST 15 - 41 U/L - - 62(H)  ALT 0 - 44 U/L - - 84(H)   CBC Latest Ref Rng & Units 09/22/2018 09/21/2018  09/19/2018  WBC 4.0 - 10.5 K/uL 3.1(L) 4.0 -  Hemoglobin 12.0 - 15.0 g/dL 7.3(L) 8.0(L) 9.9(L)  Hematocrit 36.0 - 46.0 % 26.2(L) 28.8(L) 29.0(L)  Platelets 150 - 400 K/uL 65(L) 57(L) -   CBG (last 3)  Recent Labs    09/22/18 0106 09/22/18 0341 09/22/18 0753  GLUCAP 203* 183* 165*   ABG    Component Value Date/Time   PHART 7.301 (L) 09/21/2018 2125   PCO2ART 91.0 (HH) 09/21/2018 2125   PO2ART 101 09/21/2018 2125   HCO3 43.7 (H) 09/21/2018 2125   TCO2 49 (H) 09/16/2018 2205   O2SAT 97.9 09/21/2018 2125   Chesley Mires, MD [facility] 09/22/2018, 10:17 AM

## 2018-09-23 ENCOUNTER — Other Ambulatory Visit: Payer: Self-pay

## 2018-09-23 LAB — CULTURE, BLOOD (ROUTINE X 2)

## 2018-09-23 LAB — BASIC METABOLIC PANEL
Anion gap: 11 (ref 5–15)
BUN: 83 mg/dL — ABNORMAL HIGH (ref 8–23)
CO2: 43 mmol/L — ABNORMAL HIGH (ref 22–32)
Calcium: 8.7 mg/dL — ABNORMAL LOW (ref 8.9–10.3)
Chloride: 82 mmol/L — ABNORMAL LOW (ref 98–111)
Creatinine, Ser: 1.56 mg/dL — ABNORMAL HIGH (ref 0.44–1.00)
GFR calc Af Amer: 40 mL/min — ABNORMAL LOW (ref >60)
GFR calc non Af Amer: 35 mL/min — ABNORMAL LOW (ref >60)
Glucose, Bld: 114 mg/dL — ABNORMAL HIGH (ref 70–99)
Potassium: 4.8 mmol/L (ref 3.5–5.1)
Sodium: 136 mmol/L (ref 135–145)

## 2018-09-23 LAB — GLUCOSE, CAPILLARY
Glucose-Capillary: 123 mg/dL — ABNORMAL HIGH (ref 70–99)
Glucose-Capillary: 140 mg/dL — ABNORMAL HIGH (ref 70–99)
Glucose-Capillary: 169 mg/dL — ABNORMAL HIGH (ref 70–99)
Glucose-Capillary: 192 mg/dL — ABNORMAL HIGH (ref 70–99)

## 2018-09-23 LAB — CBC
HCT: 26.1 % — ABNORMAL LOW (ref 36.0–46.0)
Hemoglobin: 7.3 g/dL — ABNORMAL LOW (ref 12.0–15.0)
MCH: 24.3 pg — ABNORMAL LOW (ref 26.0–34.0)
MCHC: 28 g/dL — ABNORMAL LOW (ref 30.0–36.0)
MCV: 87 fL (ref 80.0–100.0)
Platelets: 79 10*3/uL — ABNORMAL LOW (ref 150–400)
RBC: 3 MIL/uL — ABNORMAL LOW (ref 3.87–5.11)
RDW: 19.9 % — ABNORMAL HIGH (ref 11.5–15.5)
WBC: 3.4 10*3/uL — ABNORMAL LOW (ref 4.0–10.5)
nRBC: 0 % (ref 0.0–0.2)

## 2018-09-23 MED ORDER — ACETAMINOPHEN 325 MG PO TABS
650.0000 mg | ORAL_TABLET | Freq: Four times a day (QID) | ORAL | Status: DC
  Administered 2018-09-23 – 2018-10-01 (×21): 650 mg via ORAL
  Filled 2018-09-23 (×27): qty 2

## 2018-09-23 MED ORDER — OXYCODONE HCL 5 MG PO TABS: 10.0000 mg | ORAL_TABLET | Freq: Four times a day (QID) | ORAL | Status: DC | PRN

## 2018-09-23 MED ORDER — OXYCODONE HCL 5 MG PO TABS
10.0000 mg | ORAL_TABLET | ORAL | Status: DC | PRN
  Administered 2018-09-23 (×2): 10 mg via ORAL
  Filled 2018-09-23 (×2): qty 2

## 2018-09-23 MED ORDER — LIDOCAINE 5 % EX PTCH
1.0000 | MEDICATED_PATCH | CUTANEOUS | Status: DC
  Administered 2018-09-28 – 2018-09-30 (×2): 1 via TRANSDERMAL
  Filled 2018-09-23 (×9): qty 1

## 2018-09-23 MED ORDER — METOLAZONE 5 MG PO TABS
5.0000 mg | ORAL_TABLET | Freq: Every day | ORAL | Status: AC
  Administered 2018-09-23 – 2018-09-24 (×2): 5 mg via ORAL
  Filled 2018-09-23 (×3): qty 1

## 2018-09-23 MED ORDER — POLYETHYLENE GLYCOL 3350 17 G PO PACK
17.0000 g | PACK | Freq: Two times a day (BID) | ORAL | Status: DC
  Administered 2018-09-23 – 2018-10-01 (×9): 17 g via ORAL
  Filled 2018-09-23 (×15): qty 1

## 2018-09-23 MED ORDER — DICLOFENAC SODIUM 1 % TD GEL
2.0000 g | Freq: Four times a day (QID) | TRANSDERMAL | Status: DC
  Administered 2018-09-26 – 2018-10-04 (×6): 2 g via TOPICAL
  Filled 2018-09-23: qty 100

## 2018-09-23 NOTE — Progress Notes (Signed)
[facility]    NEPHROLOGY PROGRESS NOTE  SUBJECTIVE: Reports breathing is improved compared to yesterday. Denies any headaches, fevers, chills, dysuria, hematuria, or skin rash.  Denies any skin rash, arthralgias or myalgias.  All other review of systems are negative.  OBJECTIVE:  Vitals:   09/23/18 0726 09/23/18 1136  BP: 132/84 (!) 121/54  Pulse: 89 (!) 101  Resp: 17 10  Temp: 98 F (36.7 C) 97.7 F (36.5 C)  SpO2: 98% 97%    Intake/Output Summary (Last 24 hours) at 09/23/2018 1151 Last data filed at 09/23/2018 0914 Gross per 24 hour  Intake 395.35 ml  Output 1240 ml  Net -844.65 ml      General:  AAOx3 NAD HEENT: MMM [place] AT anicteric sclera Neck:  No JVD, no adenopathy CV:  Heart RRR  Lungs:  L/S with bibasilar Rales Abd:  abd SNT/ND with normal BS, obese GU:  Bladder non-palpable Extremities: +2 bilateral lower extremity edema Skin:  No skin rash  MEDICATIONS:  . acetaminophen  650 mg Oral Q6H  . chlorhexidine  15 mL Mouth Rinse BID  . diclofenac sodium  2 g Topical QID  . diltiazem  180 mg Oral Daily  . furosemide  80 mg Intravenous Q12H  . heparin  5,000 Units Subcutaneous Q8H  . insulin aspart  0-15 Units Subcutaneous TID WC  . insulin aspart  0-5 Units Subcutaneous QHS  . insulin detemir  10 Units Subcutaneous BID  . levothyroxine  88 mcg Oral Q0600  . lidocaine  1 patch Transdermal Q24H  . mouth rinse  15 mL Mouth Rinse q12n4p  . mometasone-formoterol  2 puff Inhalation BID  . polyethylene glycol  17 g Oral BID  . pravastatin  20 mg Oral Daily       LABS:   CBC Latest Ref Rng & Units 09/23/2018 09/22/2018 09/21/2018  WBC 4.0 - 10.5 K/uL 3.4(L) 3.1(L) 4.0  Hemoglobin 12.0 - 15.0 g/dL 7.3(L) 7.3(L) 8.0(L)  Hematocrit 36.0 - 46.0 % 26.1(L) 26.2(L) 28.8(L)  Platelets 150 - 400 K/uL 79(L) 65(L) 57(L)    CMP Latest Ref Rng & Units 09/23/2018 09/22/2018 09/22/2018  Glucose 70 - 99 mg/dL 114(H) 132(H) -  BUN 8 - 23 mg/dL 83(H) 81(H) -  Creatinine  0.44 - 1.00 mg/dL 1.56(H) 1.55(H) -  Sodium 135 - 145 mmol/L 136 135 -  Potassium 3.5 - 5.1 mmol/L 4.8 5.2(H) 5.5(H)  Chloride 98 - 111 mmol/L 82(L) 83(L) -  CO2 22 - 32 mmol/L 43(H) 42(H) -  Calcium 8.9 - 10.3 mg/dL 8.7(L) 8.7(L) -  Total Protein 6.5 - 8.1 g/dL - - -  Total Bilirubin 0.3 - 1.2 mg/dL - - -  Alkaline Phos 38 - 126 U/L - - -  AST 15 - 41 U/L - - -  ALT 0 - 44 U/L - - -    Lab Results  Component Value Date   CALCIUM 8.7 (L) 09/23/2018   CAION 1.09 (L) 10/14/2018   PHOS 5.7 (H) 09/22/2018       Component Value Date/Time   COLORURINE YELLOW 08/29/2018 0950   APPEARANCEUR CLEAR 08/29/2018 0950   LABSPEC 1.014 08/29/2018 0950   PHURINE 5.0 08/29/2018 0950   GLUCOSEU NEGATIVE 08/29/2018 0950   GLUCOSEU NEGATIVE 08/17/2016 1340   HGBUR SMALL (A) 08/29/2018 0950   BILIRUBINUR NEGATIVE 08/29/2018 0950   BILIRUBINUR neg 01/15/2015 1353   KETONESUR NEGATIVE 08/29/2018 0950   PROTEINUR 30 (A) 08/29/2018 0950   UROBILINOGEN 1.0 08/17/2016 1340  NITRITE NEGATIVE 08/29/2018 0950   LEUKOCYTESUR NEGATIVE 08/29/2018 0950      Component Value Date/Time   PHART 7.301 (L) 09/21/2018 2125   PCO2ART 91.0 (HH) 09/21/2018 2125   PO2ART 101 09/21/2018 2125   HCO3 43.7 (H) 09/21/2018 2125   TCO2 49 (H) 09/16/2018 2205   O2SAT 97.9 09/21/2018 2125       Component Value Date/Time   IRON 16 (L) 09/22/2018 1728   TIBC 328 09/22/2018 1728   FERRITIN 39 09/22/2018 1728   IRONPCTSAT 5 (L) 09/22/2018 1728       ASSESSMENT/PLAN:    63 year old female patient with a past medical history significant for chronic kidney disease stage III with a baseline creatinine of 1.3-1.6, diabetes, COPD, and congestive heart failure who presented with shortness of breath and acute on chronic hypercarbic respiratory failure.  She had a recent acute kidney injury event with a peak creatinine of 2.82 on 4/16.  She was markedly hyperkalemic with a potassium of 7.2 on admission, and was on potassium  supplementation at home.  1.  Hyperkalemia.  Likely secondary to acute kidney injury and potassium supplementation.  Status post IV Lasix.  Did not tolerate Kayexalate enema.  Potassium has improved.  Cortisol level pending.  2.  Chronic kidney disease stage III.  Her baseline serum creatinine runs around 1.3-1.6.  She is status post nephrectomy with a solitary kidney.  Suspect related to underlying diabetes.  3.  Acute on chronic hypercarbic respiratory failure with hypoxia.  She has underlying severe COPD.  She continues BiPAP intermittently.  Continue to diurese as able.  CT of the chest is pending.  Chest x-ray from 5/6 revealed vascular congestion and bilateral airspace opacities  4.  Chronic diastolic congestive heart failure.  She appears to be fluid overloaded.  Agree with continued diuresis.  Urinalysis from 08/29/2018 with minimal proteinuria and small blood.  Has significant more volume to diurese.  Will add metolazone.  5.  Atrial fibrillation.  On diltiazem.  Not on oral anticoagulation reportedly due to chronic thrombocytopenia     [employer], DO, FACP

## 2018-09-23 NOTE — Progress Notes (Signed)
PROGRESS NOTE    Renee Noble  RAQ:[id number] DOB: 1955/09/29 DOA: 10/08/2018 PCP: [name], PA-C   Brief Narrative:  63 year female with extensive history significant for but not limited to chronic hypoxic respiratory failure on 3 L oxygen at home, COPD, OHS, OSA, chronic diastolic CHF, PAF - not on AC given chronic thrombocytopenia/ recent anemia, diabetes type 2, and obesity presenting from rehab/ [facility] for altered mental status and hyperkalemia.   Recent hospitalization 4/13- 5/2 for acute on chronic respiratory failure requiring intubation twice and acute on chronic heart failure with noncompliance in wearing CPAP and fluid restriction; was COVID negative twice.  She was admitted for acute on chronic hypoxic and hypercapnic respiratory failure as well as hyperkalemia.  She was initially admitted to the ICU on bipap, does not appear she was ever intubated.   Assessment & Plan:   Active Problems:   Respiratory failure with hypercapnia (HCC)   Hyperkalemia   Acute on chronic hypoxic/hypercapnic respiratory failure. Severe COPD. OSA/OHS. - dulera, prn abluterol/duoneb - oxygen to keep SpO2 88 to 95% - Bipap qhs and prn - caution with opiates and sedating medications  HFpEF Exacerbation: Echo with normal EF. RV with severely reduced systolic function. (see report)  BNP ~1090.4 CXR on 5/6 with evidence of HF Lasix 80 IV BID (on 40 PO BID at home) Getting dosed with metolazone x 2 doses today Appreciate renal assistance with diuresis I/O, daily weights Repeat CXR tomorrow  Permanent atrial fibrillation. Not on anticoagulation due to chronic thrombocytopenia Continue to follow Cardizem  Hyperkalemia:  Improved Appreciate nephrology Continue lasix Cortisol 20  Acute Kidney Injury:  Baseline creatinine is <1 1.56 now Renal following, appreciate recs  Hx of HLD. -  pravachol  RUL pulmonary nodule. - f/u CT chest in July 2020 as outpt   DM type II. - SSI with levemir  Hypothyroidism: - synthroid  Anemia, thrombocytopenia of chronic disease and iron deficiency. - f/u CBC, stable - low iron levels, consider iron infusion prior to d/c - pt constipated, hold off on PO iron  Acute metabolic encephalopathy - improved  Hx of depression, chronic pain - scheduled APAP, voltaren, lidocaine patch - resume elavil - hold MS contin for now, restart oxycodone (of note, she told me she was taking oxy 10 mg for breakthrough pain, but I don't see this on PMP aware - ? If this was from facility?)  Deconditioning. - PT/OT - recommending SNF  Elevated LFT's Follow  1/2 Blood Cx with coag neg staph, suspect contaminant, follow  DVT prophylaxis: SCD Code Status: full  Family Communication: none at bedside Disposition Plan: pending improvement   Consultants:   PCCM  Nephrology  Procedures:  IMPRESSIONS    1. The left ventricle has low normal systolic function, with an ejection fraction of 50-55%. The cavity size was normal. There is mildly increased left ventricular wall thickness. Left ventricular diastolic Doppler parameters are consistent with  indeterminate diastolic dysfunction.  2. The right ventricle has severely reduced systolic function. The cavity was severely enlarged. There is no increase in right ventricular wall thickness.  3. Left atrial size was moderately dilated.  4. Right atrial size was moderately dilated.  5. Mildly calcified tricuspid valve leaflets.  6. Moderate thickening of the mitral valve leaflet. Moderate calcification of the mitral valve leaflet. There is moderate mitral annular calcification present.  7. The aortic valve was not well visualized Moderate thickening of the aortic valve Moderate calcification of the aortic  valve. Aortic valve regurgitation is trivial by color flow Doppler. Mild-moderate stenosis of the aortic valve.  8. The inferior vena cava was dilated in size with <50%  respiratory variability.  Antimicrobials:  Anti-infectives (From admission, onward)   None      Subjective: C/o chronic pain Asking for her MS contin.  Note she took this and oxy for breakthrough.  Objective: Vitals:   09/23/18 0426 09/23/18 0726 09/23/18 1136 09/23/18 1500  BP: 118/64 132/84 (!) 121/54 (!) 120/54  Pulse:  89 (!) 101 96  Resp: 14 17 10    Temp:  98 F (36.7 C) 97.7 F (36.5 C) 97.6 F (36.4 C)  TempSrc:  Oral Oral Oral  SpO2: 99% 98% 97% 95%  Weight:        Intake/Output Summary (Last 24 hours) at 09/23/2018 1724 Last data filed at 09/23/2018 1500 Gross per 24 hour  Intake 125.35 ml  Output 1400 ml  Net -1274.65 ml   Filed Weights   09/21/18 0500 09/22/18 0500  Weight: 136 kg (!) 136.4 kg    Examination:  General exam: Appears calm and comfortable, obese  Respiratory system: Clear to auscultation. Respiratory effort normal. Cardiovascular system: S1 & S2 heard, RRR Gastrointestinal system: Abdomen is nondistended, soft and nontender.  Central nervous system: Alert and oriented. No focal neurological deficits. Extremities: bilateral LE edema Skin: No rashes, lesions or ulcers Psychiatry: Judgement and insight appear normal. Mood & affect appropriate.   Data Reviewed: I have personally reviewed following labs and imaging studies  CBC: Recent Labs  Lab 09/19/2018 2157 09/22/2018 2205 09/21/18 0322 09/22/18 0336 09/23/18 0545  WBC 6.2  --  4.0 3.1* 3.4*  NEUTROABS 5.4  --   --   --   --   HGB 8.1* 9.9* 8.0* 7.3* 7.3*  HCT 30.5* 29.0* 28.8* 26.2* 26.1*  MCV 91.6  --  89.2 87.9 87.0  PLT 92*  --  57* 65* 79*   Basic Metabolic Panel: Recent Labs  Lab 09/21/18 2001 09/21/18 2159  09/22/18 0336 09/22/18 0527 09/22/18 0840 09/22/18 1728 09/23/18 0545  NA 137 137  --  138  --   --  135 136  K 6.5* 6.2*   < > 5.4* 5.3* 5.5* 5.2* 4.8  CL 85* 87*  --  83*  --   --  83* 82*  CO2 43* 42*  --  42*  --   --  42* 43*  GLUCOSE 194* 245*  --  187*   --   --  132* 114*  BUN 76* 76*  --  78*  --   --  81* 83*  CREATININE 1.58* 1.47*  --  1.51*  --   --  1.55* 1.56*  CALCIUM 9.2 8.9  --  8.9  --   --  8.7* 8.7*  MG  --   --   --  2.0  --   --   --   --   PHOS  --   --   --  5.7*  --   --   --   --    < > = values in this interval not displayed.   GFR: Estimated Creatinine Clearance: 49.6 mL/min (A) (by C-G formula based on SCr of 1.56 mg/dL (H)). Liver Function Tests: Recent Labs  Lab 10/01/2018 2157 09/22/18 0336  AST 38 62*  ALT 59* 84*  ALKPHOS 161* 181*  BILITOT 0.7 0.6  PROT 5.9* 5.5*  ALBUMIN 3.2* 3.0*   No results  for input(s): LIPASE, AMYLASE in the last 168 hours. No results for input(s): AMMONIA in the last 168 hours. Coagulation Profile: Recent Labs  Lab 10/06/2018 2157  INR 1.3*   Cardiac Enzymes: Recent Labs  Lab 09/17/2018 2157 09/21/18 0736 09/21/18 2159  CKTOTAL  --   --  9*  TROPONINI 0.03* 0.03*  --    BNP (last 3 results) No results for input(s): PROBNP in the last 8760 hours. HbA1C: Recent Labs    09/21/18 0736  HGBA1C 5.6   CBG: Recent Labs  Lab 09/22/18 1558 09/22/18 2105 09/23/18 0725 09/23/18 1133 09/23/18 1633  GLUCAP 135* 189* 123* 140* 169*   Lipid Profile: No results for input(s): CHOL, HDL, LDLCALC, TRIG, CHOLHDL, LDLDIRECT in the last 72 hours. Thyroid Function Tests: No results for input(s): TSH, T4TOTAL, FREET4, T3FREE, THYROIDAB in the last 72 hours. Anemia Panel: Recent Labs    09/22/18 1728  FERRITIN 39  TIBC 328  IRON 16*   Sepsis Labs: Recent Labs  Lab 10/10/2018 2157  LATICACIDVEN 1.5    Recent Results (from the past 240 hour(s))  Novel Coronavirus, NAA (hospital order; send-out to ref lab)     Status: None   Collection Time: 09/15/18  3:36 PM  Result Value Ref Range Status   SARS-CoV-2, NAA NOT DETECTED NOT DETECTED Final    Comment: (NOTE) This test was developed and its performance characteristics determined by [employer]. This test has not  been FDA cleared or approved. This test has been authorized by FDA under an Emergency Use Authorization (EUA). This test is only authorized for the duration of time the declaration that circumstances exist justifying the authorization of the emergency use of in vitro diagnostic tests for detection of SARS-CoV-2 virus and/or diagnosis of COVID-19 infection under section 564(b)(1) of the Act, 21 U.S.C. 854OEV-0(J)(5), unless the authorization is terminated or revoked sooner. When diagnostic testing is negative, the possibility of a false negative result should be considered in the context of a patient's recent exposures and the presence of clinical signs and symptoms consistent with COVID-19. An individual without symptoms of COVID-19 and who is not shedding SARS-CoV-2 virus would expect to have a negative (not detected) result in this assay. Performed  At: [facility] [place], [place] [id number] Rush Farmer MD HB:[id number]    [place]  Final    Comment: Performed at [facility] [address], [place], [place] [address]  Culture, blood (routine x 2)     Status: None (Preliminary result)   Collection Time: 10/04/2018  9:50 PM  Result Value Ref Range Status   Specimen Description BLOOD LEFT HAND  Final   Special Requests   Final    BOTTLES DRAWN AEROBIC AND ANAEROBIC Blood Culture adequate volume   Culture   Final    NO GROWTH 2 DAYS Performed at [facility], [place] [address], [place], [place] [address]    Report Status PENDING  Incomplete  SARS Coronavirus 2 (CEPHEID- Performed in [place] hospital lab), Hosp Order     Status: None   Collection Time: 10/10/2018  9:54 PM  Result Value Ref Range Status   SARS Coronavirus 2 NEGATIVE NEGATIVE Final    Comment: (NOTE) If result is NEGATIVE SARS-CoV-2 target nucleic acids are NOT DETECTED. The SARS-CoV-2 RNA is generally detectable in upper and lower   respiratory specimens during the acute phase of infection. The lowest  concentration of SARS-CoV-2 viral copies this assay can detect  is 250  copies / mL. A negative result does not preclude SARS-CoV-2 infection  and should not be used as the sole basis for treatment or other  patient management decisions.  A negative result may occur with  improper specimen collection / handling, submission of specimen other  than nasopharyngeal swab, presence of viral mutation(s) within the  areas targeted by this assay, and inadequate number of viral copies  (<250 copies / mL). A negative result must be combined with clinical  observations, patient history, and epidemiological information. If result is POSITIVE SARS-CoV-2 target nucleic acids are DETECTED. The SARS-CoV-2 RNA is generally detectable in upper and lower  respiratory specimens dur ing the acute phase of infection.  Positive  results are indicative of active infection with SARS-CoV-2.  Clinical  correlation with patient history and other diagnostic information is  necessary to determine patient infection status.  Positive results do  not rule out bacterial infection or co-infection with other viruses. If result is PRESUMPTIVE POSTIVE SARS-CoV-2 nucleic acids MAY BE PRESENT.   A presumptive positive result was obtained on the submitted specimen  and confirmed on repeat testing.  While 2019 novel coronavirus  (SARS-CoV-2) nucleic acids may be present in the submitted sample  additional confirmatory testing may be necessary for epidemiological  and / or clinical management purposes  to differentiate between  SARS-CoV-2 and other Sarbecovirus currently known to infect humans.  If clinically indicated additional testing with an alternate test  methodology [phone]) is advised. The SARS-CoV-2 RNA is generally  detectable in upper and lower respiratory sp ecimens during the acute  phase of infection. The expected result is Negative. Fact  Sheet for Patients:  StrictlyIdeas.no Fact Sheet for Healthcare Providers: BankingDealers.co.za This test is not yet approved or cleared by the [place] FDA and has been authorized for detection and/or diagnosis of SARS-CoV-2 by FDA under an Emergency Use Authorization (EUA).  This EUA will remain in effect (meaning this test can be used) for the duration of the COVID-19 declaration under Section 564(b)(1) of the Act, 21 U.S.C. section 360bbb-3(b)(1), unless the authorization is terminated or revoked sooner. Performed at [facility], [place] [address], [place], [place] [address]   Culture, blood (routine x 2)     Status: Abnormal   Collection Time: 09/26/2018 10:05 PM  Result Value Ref Range Status   Specimen Description BLOOD LEFT ARM  Final   Special Requests   Final    BOTTLES DRAWN AEROBIC AND ANAEROBIC Blood Culture results may not be optimal due to an excessive volume of blood received in culture bottles   Culture  Setup Time   Final    AEROBIC BOTTLE ONLY GRAM POSITIVE COCCI CRITICAL RESULT CALLED TO, READ BACK BY AND VERIFIED WITH: PHARMD J LEDFORD [id number] AT 110 AM BY CM    Culture (A)  Final    STAPHYLOCOCCUS SPECIES (COAGULASE NEGATIVE) THE SIGNIFICANCE OF ISOLATING THIS ORGANISM FROM A SINGLE SET OF BLOOD CULTURES WHEN MULTIPLE SETS ARE DRAWN IS UNCERTAIN. PLEASE NOTIFY THE MICROBIOLOGY DEPARTMENT WITHIN ONE WEEK IF SPECIATION AND SENSITIVITIES ARE REQUIRED. Performed at [facility], [place] [address] [place],  [address]    Report Status 09/23/2018 FINAL  Final  Blood Culture ID Panel (Reflexed)     Status: Abnormal   Collection Time: 09/21/2018 10:05 PM  Result Value Ref Range Status   Enterococcus species NOT DETECTED NOT DETECTED Final   Listeria monocytogenes NOT DETECTED NOT DETECTED Final   Staphylococcus species DETECTED (  A) NOT DETECTED Final    Comment: Methicillin (oxacillin) resistant coagulase  negative staphylococcus. Possible blood culture contaminant (unless isolated from more than one blood culture draw or clinical case suggests pathogenicity). No antibiotic treatment is indicated for blood  culture contaminants. CRITICAL RESULT CALLED TO, READ BACK BY AND VERIFIED WITH: PHARMD J LEDFORD [id number] AT 45 AM BY CM    Staphylococcus aureus (BCID) NOT DETECTED NOT DETECTED Final   Methicillin resistance DETECTED (A) NOT DETECTED Final    Comment: CRITICAL RESULT CALLED TO, READ BACK BY AND VERIFIED WITH: PHARMD J LEDFORD [id number] AT 70 AM BY CM    Streptococcus species NOT DETECTED NOT DETECTED Final   Streptococcus agalactiae NOT DETECTED NOT DETECTED Final   Streptococcus pneumoniae NOT DETECTED NOT DETECTED Final   Streptococcus pyogenes NOT DETECTED NOT DETECTED Final   Acinetobacter baumannii NOT DETECTED NOT DETECTED Final   Enterobacteriaceae species NOT DETECTED NOT DETECTED Final   Enterobacter cloacae complex NOT DETECTED NOT DETECTED Final   Escherichia coli NOT DETECTED NOT DETECTED Final   Klebsiella oxytoca NOT DETECTED NOT DETECTED Final   Klebsiella pneumoniae NOT DETECTED NOT DETECTED Final   Proteus species NOT DETECTED NOT DETECTED Final   Serratia marcescens NOT DETECTED NOT DETECTED Final   Haemophilus influenzae NOT DETECTED NOT DETECTED Final   Neisseria meningitidis NOT DETECTED NOT DETECTED Final   Pseudomonas aeruginosa NOT DETECTED NOT DETECTED Final   Candida albicans NOT DETECTED NOT DETECTED Final   Candida glabrata NOT DETECTED NOT DETECTED Final   Candida krusei NOT DETECTED NOT DETECTED Final   Candida parapsilosis NOT DETECTED NOT DETECTED Final   Candida tropicalis NOT DETECTED NOT DETECTED Final    Comment: Performed at [facility], [address] [place], [place] [address]  MRSA PCR Screening     Status: None   Collection Time: 09/21/18  2:34 AM  Result Value Ref Range Status   MRSA by PCR NEGATIVE NEGATIVE Final    Comment:         The GeneXpert MRSA Assay (FDA approved for NASAL specimens only), is one component of a comprehensive MRSA colonization surveillance program. It is not intended to diagnose MRSA infection nor to guide or monitor treatment for MRSA infections. Performed at [facility], [place] [address] [place], [place] [address]          Radiology Studies: No results found.      Scheduled Meds: . acetaminophen  650 mg Oral Q6H  . chlorhexidine  15 mL Mouth Rinse BID  . diclofenac sodium  2 g Topical QID  . diltiazem  180 mg Oral Daily  . furosemide  80 mg Intravenous Q12H  . heparin  5,000 Units Subcutaneous Q8H  . insulin aspart  0-15 Units Subcutaneous TID WC  . insulin aspart  0-5 Units Subcutaneous QHS  . insulin detemir  10 Units Subcutaneous BID  . levothyroxine  88 mcg Oral Q0600  . lidocaine  1 patch Transdermal Q24H  . mouth rinse  15 mL Mouth Rinse q12n4p  . metolazone  5 mg Oral Daily  . mometasone-formoterol  2 puff Inhalation BID  . polyethylene glycol  17 g Oral BID  . pravastatin  20 mg Oral Daily   Continuous Infusions: . sodium chloride 10 mL/hr at 09/22/18 1400     LOS: 3 days    Time spent: over 30 min    Fayrene Helper, MD [facility] Pager AMION  If 7PM-7AM, please contact night-coverage [email or web address] Password  [email or web address] 09/23/2018, 5:24 PM

## 2018-09-23 NOTE — Progress Notes (Signed)
Multiple attempts to make pt comfortable with BPAP on. Pt refusing BPAP at this time. Pt instructed on possible outcomes of not wearing BPAP. Respiratory notified of pt's refusal of BPAP

## 2018-09-23 NOTE — Evaluation (Signed)
Physical Therapy Evaluation Patient Details Name: Renee Noble MRN: [id number] DOB: August 27, 1955 Today's Date: 09/23/2018   History of Present Illness  63 year female with extensive history significant for but not limited to chronic hypoxic respiratory failure on 3 L oxygen at home, COPD, OHS, OSA, chronic diastolic CHF, PAF - not on AC given chronic thrombocytopenia/ recent anemia, diabetes type 2, and obesity presenting from rehab/ [facility] for altered mental status and hyperkalemia.   Clinical Impression  Pt admitted with above. With maxAX2 pt able to transfer to EOB with max encouragement and tolerate sitting x 4 min with maxA. Pt remains appropriate to return to [facility] once medically stable.    Follow Up Recommendations SNF    Equipment Recommendations  None recommended by PT    Recommendations for Other Services       Precautions / Restrictions Precautions Precautions: Fall Precaution Comments: from [place] Weight Bearing Restrictions: No      Mobility  Bed Mobility Overal bed mobility: Needs Assistance Bed Mobility: Supine to Sit;Sit to Supine;Rolling Rolling: Total assist;+2 for physical assistance Sidelying to sit: Total assist;+2 for physical assistance Supine to sit: Total assist;+2 for physical assistance;+2 for safety/equipment;HOB elevated Sit to supine: Total assist;+2 for physical assistance;+2 for safety/equipment;HOB elevated Sit to sidelying: Total assist;+2 for physical assistance General bed mobility comments: with max encouragement pt able to move LEs to EOB with 50% assist, maxAX2 for trunk elevation and total assist x 2 to pivot to EOB and return to supine  Transfers                 General transfer comment: unable  Ambulation/Gait             General Gait Details: unable  Stairs            Wheelchair Mobility    Modified Rankin (Stroke Patients Only)       Balance Overall  balance assessment: Needs assistance Sitting-balance support: Bilateral upper extremity supported;Feet supported Sitting balance-Leahy Scale: Poor Sitting balance - Comments: maxA to maintain EOB balance, c/o back pain, tolerated x 4 min                                     Pertinent Vitals/Pain Pain Assessment: 0-10 Pain Score: 8  Pain Location: back and neck Pain Descriptors / Indicators: Guarding Pain Intervention(s): Monitored during session    Home Living Family/patient expects to be discharged to:: Skilled nursing facility                 Additional Comments: was at [facility], recent admission/d/c on 5/2    Prior Function Level of Independence: Needs assistance   Gait / Transfers Assistance Needed: per pt staff was using lift to transfer pt to chair and EOB, she only stood one time with therapy  ADL's / Homemaking Assistance Needed: dependent on staff        Hand Dominance   Dominant Hand: Right    Extremity/Trunk Assessment   Upper Extremity Assessment Upper Extremity Assessment: Defer to OT evaluation    Lower Extremity Assessment Lower Extremity Assessment: Generalized weakness       Communication   Communication: No difficulties  Cognition Arousal/Alertness: Awake/alert Behavior During Therapy: WFL for tasks assessed/performed Overall Cognitive Status: Within Functional Limits for tasks assessed  General Comments: pt can be self limiting, very particular      General Comments General comments (skin integrity, edema, etc.): SpO2 >91% t/o session on 4LO2 via [place]    Exercises     Assessment/Plan    PT Assessment Patient needs continued PT services  PT Problem List Decreased strength;Decreased activity tolerance;Decreased balance;Decreased mobility;Decreased knowledge of use of DME;Obesity       PT Treatment Interventions DME instruction;Gait training;Functional  mobility training;Therapeutic activities;Therapeutic exercise;Patient/family education    PT Goals (Current goals can be found in the Care Plan section)  Acute Rehab PT Goals Patient Stated Goal: got back to [facility] PT Goal Formulation: With patient Time For Goal Achievement: 10/13/18 Potential to Achieve Goals: Good    Frequency Min 2X/week   Barriers to discharge        Co-evaluation PT/OT/SLP Co-Evaluation/Treatment: Yes Reason for Co-Treatment: For patient/therapist safety PT goals addressed during session: Mobility/safety with mobility         AM-PAC PT "6 Clicks" Mobility  Outcome Measure Help needed turning from your back to your side while in a flat bed without using bedrails?: Total Help needed moving from lying on your back to sitting on the side of a flat bed without using bedrails?: Total Help needed moving to and from a bed to a chair (including a wheelchair)?: Total Help needed standing up from a chair using your arms (e.g., wheelchair or bedside chair)?: Total Help needed to walk in hospital room?: Total Help needed climbing 3-5 steps with a railing? : Total 6 Click Score: 6    End of Session Equipment Utilized During Treatment: Oxygen Activity Tolerance: Patient limited by fatigue;Treatment limited secondary to medical complications (Comment) Patient left: in bed;with bed alarm set;with call bell/phone within reach Nurse Communication: Mobility status PT Visit Diagnosis: Difficulty in walking, not elsewhere classified (R26.2)    Time: 0034-9179 PT Time Calculation (min) (ACUTE ONLY): 19 min   Charges:   PT Evaluation $PT Eval Moderate Complexity: 1 Mod          Kittie Plater, PT, DPT Acute Rehabilitation Services Pager #: [phone] Office #: [phone]   [place] Lopes 09/23/2018, 2:42 PM

## 2018-09-23 NOTE — TOC Initial Note (Signed)
Transition of Care [facility]) - Initial/Assessment Note    Patient Details  Name: Renee Noble MRN: [id number] Date of Birth: 1955/06/05  Transition of Care [facility]) CM/SW Contact:    Gelene Mink, [place] Phone Number: 09/23/2018, 10:21 AM  Clinical Narrative:                  CSW called and spoke with the patient. CSW introduced herself and explained her role. Before this hospitalization, the patient was at [facility]. The patient wants to return to [facility] as soon as possible. She inquired about her potential discharge date, CSW stated that she would follow up with the doctor and have the doctor follow up with the patient. The patient stated that she also needs a bipap machine. CSW asked if she had her own at home, she stated no. CSW will inform SNF that the patient needs a bipap machine.   CSW will continue to follow and assist with disposition.   Expected Discharge Plan: [place] Barriers to Discharge: Continued Medical Work up   Patient Goals and CMS Choice Patient states their goals for this hospitalization and ongoing recovery are:: Pt would like to return back to [facility] as soon as possible CMS Medicare.gov Compare Post Acute Care list provided to:: Other (Comment Required)(Pt returning back to the facility, [facility]) Choice offered to / list presented to : NA  Expected Discharge Plan and Services Expected Discharge Plan: [place] In-house Referral: Clinical Social Work Discharge Planning Services: NA Post Acute Care Choice: [place] Living arrangements for the past 2 months: [place], [place]                 DME Arranged: N/A DME Agency: NA         [place] Agency: NA        Prior Living Arrangements/Services Living arrangements for the past 2 months: [place], [place] Lives with:: Facility Resident Patient language and need  for interpreter reviewed:: No Do you feel safe going back to the place where you live?: Yes      Need for Family Participation in Patient Care: No (Comment) Care giver support system in place?: No (comment)   Criminal Activity/Legal Involvement Pertinent to Current Situation/Hospitalization: No - Comment as needed  Activities of Daily Living      Permission Sought/Granted Permission sought to share information with : Case Manager Permission granted to share information with : Yes, Verbal Permission Granted  Share Information with NAME: Ma  Permission granted to share info w AGENCY: [place] granted to share info w Relationship: SNF  Permission granted to share info w Contact Information: [name]  Emotional Assessment Appearance:: Appears stated age Attitude/Demeanor/Rapport: Unable to Assess Affect (typically observed): Unable to Assess Orientation: : Oriented to Self, Oriented to  Time, Oriented to Place, Oriented to Situation Alcohol / Substance Use: Not Applicable Psych Involvement: No (comment)  Admission diagnosis:  Hyperkalemia [E87.5] AKI (acute kidney injury) ([place]) [N17.9] Acute on chronic respiratory failure with hypoxia and hypercapnia (HCC) [B01.75, J96.22] Patient Active Problem List   Diagnosis Date Noted  . Hyperkalemia   . Respiratory failure with hypercapnia ([place]) 09/25/2018  . Palliative care encounter   . Endotracheal tube present   . Pressure ulcer 08/30/2018  . Acute exacerbation of chronic obstructive pulmonary disease (COPD) ([place]) 08/28/2018  . Atrial flutter with rapid ventricular response ([place]) 08/28/2018  .  AKI (acute kidney injury) ([place]) 08/28/2018  . Wound of thigh 08/15/2018  . Pressure injury of skin 07/17/2018  . Hypoglycemia due to insulin 07/14/2018  . Acute on chronic respiratory failure with hypercapnia ([place]) 06/09/2018  . HLD (hyperlipidemia) 06/09/2018  . Hypothyroidism 06/09/2018  . Depression 06/09/2018  .  Fibromyalgia   . Lung nodule 05/08/2018  . Acute on chronic congestive heart failure ([place])   . Morbid obesity with BMI of 50.0-59.9, adult ([place]) 04/10/2018  . Chronic pain 04/07/2018  . Acute on chronic diastolic CHF (congestive heart failure) ([place]) 04/07/2018  . PNA (pneumonia) 03/21/2018  . Chronic obstructive pulmonary disease ([place]) 02/25/2018  . Chronic pain syndrome   . Normocytic anemia 02/24/2018  . OSA (obstructive sleep apnea) 08/16/2017  . Morbid obesity due to excess calories ([place]) 04/29/2017  . Cor pulmonale (chronic) ([place]) 04/29/2017  . Chronic respiratory failure with hypoxia and hypercapnia ([place]) 04/28/2017  . CKD (chronic kidney disease), stage III ([place]) 04/14/2016  . Hyperlipidemia 01/16/2016  . Essential hypertension 01/16/2016  . Hypoxia   . Thrombocytopenia ([place]) 07/12/2015  . Thyroid activity decreased 06/23/2015  . Encounter for orogastric (OG) tube placement 09/02/2014  . Hernia of abdominal cavity 07/30/2014  . Abnormal CXR 02/15/2014  . COPD GOLDIII with min reversibility  12/16/2013  . History of hepatitis C 12/16/2013  . Breast cancer screening 12/16/2013  . Type II diabetes mellitus with renal manifestations ([place]) 12/16/2013  . Screening for osteoporosis 12/16/2013   PCP:  [name], PA-C Pharmacy:  No Pharmacies Listed    Social Determinants of Health (SDOH) Interventions    Readmission Risk Interventions No flowsheet data found.

## 2018-09-23 NOTE — Evaluation (Signed)
Occupational Therapy Evaluation Patient Details Name: Renee Noble MRN: [id number] DOB: 1956/05/16 Today's Date: 09/23/2018    History of Present Illness 63 year female with extensive history significant for but not limited to chronic hypoxic respiratory failure on 3 L oxygen at home, COPD, OHS, OSA, chronic diastolic CHF, PAF - not on AC given chronic thrombocytopenia/ recent anemia, diabetes type 2, and obesity presenting from rehab/ [facility] for altered mental status and hyperkalemia.    Clinical Impression   PTA, pt was at [facility] for rehab after recent admission and required assistance for ADLs and life for transfers with staff. Pt currently requiring Max-Total A for ADLs, Total A +2 for bed mobility, and Max A to maintain sitting at EOB. Pt presenting with decreased strength, balance, and activity tolerance. VSS throughout session on 4L O2. Pt would benefit from further acute OT to facilitate safe dc. Recommend dc to SNF for further OT to optimize safety, independence with ADLs, and return to PLOF.      Follow Up Recommendations  SNF    Equipment Recommendations  Other (comment)(Defer to next venue)    Recommendations for Other Services PT consult     Precautions / Restrictions Precautions Precautions: Fall Precaution Comments: from [place] Weight Bearing Restrictions: No      Mobility Bed Mobility Overal bed mobility: Needs Assistance Bed Mobility: Supine to Sit;Sit to Supine;Rolling Rolling: Total assist;+2 for physical assistance Sidelying to sit: Total assist;+2 for physical assistance Supine to sit: Total assist;+2 for physical assistance;+2 for safety/equipment;HOB elevated Sit to supine: Total assist;+2 for physical assistance;+2 for safety/equipment;HOB elevated Sit to sidelying: Total assist;+2 for physical assistance General bed mobility comments: with max encouragement pt able to move LEs to EOB with 50% assist, maxAX2  for trunk elevation and total assist x 2 to pivot to EOB and return to supine  Transfers                 General transfer comment: unable    Balance Overall balance assessment: Needs assistance Sitting-balance support: Bilateral upper extremity supported;Feet supported Sitting balance-Leahy Scale: Poor Sitting balance - Comments: maxA to maintain EOB balance, c/o back pain, tolerated x 4 min                                   ADL either performed or assessed with clinical judgement   ADL Overall ADL's : Needs assistance/impaired Eating/Feeding: Set up;Supervision/ safety;Bed level   Grooming: Set up;Supervision/safety;Bed level   Upper Body Bathing: Maximal assistance;Bed level   Lower Body Bathing: Total assistance;Bed level   Upper Body Dressing : Maximal assistance;Bed level   Lower Body Dressing: Total assistance;Bed level Lower Body Dressing Details (indicate cue type and reason): Total A for donning socks. Pt unable to lift her foot off bed to assist in donning socks     Toileting- Clothing Manipulation and Hygiene: Total assistance;Bed level         General ADL Comments: Pt with decreased activity tolerance and strength. Pt performing bed mobility to sit at EOB. However, unable to tolerate for long due to back pain     Vision         Perception     Praxis      Pertinent Vitals/Pain Pain Assessment: 0-10 Pain Score: 8  Pain Location: back and neck Pain Descriptors / Indicators: Guarding Pain Intervention(s): Monitored during session;Limited activity within patient's tolerance;Repositioned  Hand Dominance Right   Extremity/Trunk Assessment Upper Extremity Assessment Upper Extremity Assessment: Generalized weakness   Lower Extremity Assessment Lower Extremity Assessment: Generalized weakness   Cervical / Trunk Assessment Cervical / Trunk Assessment: Other exceptions Cervical / Trunk Exceptions: Increased body habitus    Communication Communication Communication: No difficulties   Cognition Arousal/Alertness: Awake/alert Behavior During Therapy: WFL for tasks assessed/performed Overall Cognitive Status: Within Functional Limits for tasks assessed                                 General Comments: pt can be self limiting, very particular   General Comments  SpO2 >91% t/o session on 4LO2 via [place]    Exercises     Shoulder Instructions      Home Living Family/patient expects to be discharged to:: Skilled nursing facility                                 Additional Comments: was at [facility], recent admission/d/c on 5/2      Prior Functioning/Environment Level of Independence: Needs assistance  Gait / Transfers Assistance Needed: per pt staff was using lift to transfer pt to chair and EOB, she only stood one time with therapy ADL's / Homemaking Assistance Needed: dependent on staff            OT Problem List: Decreased range of motion;Decreased strength;Decreased activity tolerance;Impaired balance (sitting and/or standing);Obesity;Cardiopulmonary status limiting activity;Pain      OT Treatment/Interventions: Self-care/ADL training;Therapeutic exercise;Neuromuscular education;Energy conservation;DME and/or AE instruction;Therapeutic activities;Patient/family education;Balance training    OT Goals(Current goals can be found in the care plan section) Acute Rehab OT Goals Patient Stated Goal: got back to [place] OT Goal Formulation: With patient Time For Goal Achievement: Oct 08, 2018 Potential to Achieve Goals: Good  OT Frequency: Min 2X/week   Barriers to D/C:            Co-evaluation PT/OT/SLP Co-Evaluation/Treatment: Yes Reason for Co-Treatment: Complexity of the patient's impairments (multi-system involvement);For patient/therapist safety;To address functional/ADL transfers PT goals addressed during session: Mobility/safety with mobility OT  goals addressed during session: ADL's and self-care      AM-PAC OT "6 Clicks" Daily Activity     Outcome Measure Help from another person eating meals?: None Help from another person taking care of personal grooming?: A Lot Help from another person toileting, which includes using toliet, bedpan, or urinal?: Total Help from another person bathing (including washing, rinsing, drying)?: A Lot Help from another person to put on and taking off regular upper body clothing?: A Lot Help from another person to put on and taking off regular lower body clothing?: Total 6 Click Score: 12   End of Session Equipment Utilized During Treatment: Oxygen(4L) Nurse Communication: Mobility status;Other (comment)(SpO2)  Activity Tolerance: Patient limited by fatigue Patient left: in bed;with call bell/phone within reach;with bed alarm set  OT Visit Diagnosis: Unsteadiness on feet (R26.81);Other abnormalities of gait and mobility (R26.89);Muscle weakness (generalized) (M62.81);Pain Pain - part of body: (Backand neck)                Time: 6222-9798 OT Time Calculation (min): 18 min Charges:  OT General Charges $OT Visit: 1 Visit OT Evaluation $OT Eval Moderate Complexity: 1 Mod  Aysia Lowder MSOT, OTR/L Acute Rehab Pager: [phone] Office: [place] 09/23/2018, 3:30 PM

## 2018-09-23 NOTE — NC FL2 (Signed)
[place] MEDICAID FL2 LEVEL OF CARE SCREENING TOOL     IDENTIFICATION  Patient Name: Renee Noble Birthdate: 08-08-1955 Sex: female Admission Date (Current Location): 09/22/2018  [facility] and [place] Number:  Herbalist and Address:  [facility] [place]. [facility], [place] [address] [place], [place] [address]      Provider Number: [id number]  Attending Physician Name and Address:  Elodia Florence., *  Relative Name and Phone Number:  Venancio Poisson, Relative, [phone]    Current Level of Care: Hospital Recommended Level of Care: [place] Prior Approval Number:    Date Approved/Denied:   PASRR Number: [id number]  Discharge Plan: SNF    Current Diagnoses: Patient Active Problem List   Diagnosis Date Noted  . Hyperkalemia   . Respiratory failure with hypercapnia ([place]) 09/29/2018  . Palliative care encounter   . Endotracheal tube present   . Pressure ulcer 08/30/2018  . Acute exacerbation of chronic obstructive pulmonary disease (COPD) ([place]) 08/28/2018  . Atrial flutter with rapid ventricular response ([place]) 08/28/2018  . AKI (acute kidney injury) ([place]) 08/28/2018  . Wound of thigh 08/15/2018  . Pressure injury of skin 07/17/2018  . Hypoglycemia due to insulin 07/14/2018  . Acute on chronic respiratory failure with hypercapnia ([place]) 06/09/2018  . HLD (hyperlipidemia) 06/09/2018  . Hypothyroidism 06/09/2018  . Depression 06/09/2018  . Fibromyalgia   . Lung nodule 05/08/2018  . Acute on chronic congestive heart failure ([place])   . Morbid obesity with BMI of 50.0-59.9, adult ([place]) 04/10/2018  . Chronic pain 04/07/2018  . Acute on chronic diastolic CHF (congestive heart failure) ([place]) 04/07/2018  . PNA (pneumonia) 03/21/2018  . Chronic obstructive pulmonary disease ([place]) 02/25/2018  . Chronic pain syndrome   . Normocytic anemia 02/24/2018  . OSA (obstructive sleep apnea) 08/16/2017  . Morbid obesity due to excess calories ([place])  04/29/2017  . Cor pulmonale (chronic) ([place]) 04/29/2017  . Chronic respiratory failure with hypoxia and hypercapnia ([place]) 04/28/2017  . CKD (chronic kidney disease), stage III ([place]) 04/14/2016  . Hyperlipidemia 01/16/2016  . Essential hypertension 01/16/2016  . Hypoxia   . Thrombocytopenia ([place]) 07/12/2015  . Thyroid activity decreased 06/23/2015  . Encounter for orogastric (OG) tube placement 09/02/2014  . Hernia of abdominal cavity 07/30/2014  . Abnormal CXR 02/15/2014  . COPD GOLDIII with min reversibility  12/16/2013  . History of hepatitis C 12/16/2013  . Breast cancer screening 12/16/2013  . Type II diabetes mellitus with renal manifestations ([place]) 12/16/2013  . Screening for osteoporosis 12/16/2013    Orientation RESPIRATION BLADDER Height & Weight     Self, Time, Situation, Place  O2, Other (Comment)(98, , 4L; also on bipap set rate 20, 02% 40, flow rate 0) Incontinent, External catheter Weight: (!) 300 lb 11.3 oz (136.4 kg) Height:     BEHAVIORAL SYMPTOMS/MOOD NEUROLOGICAL BOWEL NUTRITION STATUS      Incontinent Diet(Renal diet/carb modified, thin liquids with fluid restriciton of 1259ml, no snacks)  AMBULATORY STATUS COMMUNICATION OF NEEDS Skin   Limited Assist   Other (Comment), Skin abrasions, Bruising(blister on lip, skin cracking on leg, brusing on arm/chest/back, MASD on buttocks/breast, skin tear on arm, pressure injury on ischial tuberosity and buttocks)                       Personal Care Assistance Level of Assistance  Bathing, Feeding, Dressing, Total care Bathing Assistance: Limited assistance Feeding assistance: Independent(Can eat by herself) Dressing Assistance: Limited assistance Total  Care Assistance: Limited assistance   Functional Limitations Info  Sight, Hearing, Speech Sight Info: Adequate Hearing Info: Adequate Speech Info: Adequate    SPECIAL CARE FACTORS FREQUENCY  PT (By licensed PT), OT (By licensed OT)     PT Frequency: Ordered  but no eval OT Frequency: ordered but no eval            Contractures Contractures Info: Not present    Additional Factors Info  Code Status, Allergies Code Status Info: Full Code Allergies Info: Gabapentin, Lyrica Pregabalin, Ketorolac Tromethamine, Lisinopril   Insulin Sliding Scale Info: insulin aspart novolog 0-15 units 3x daily w/meals, insulin aspart novolog 0-5 units daily at bedtime, insulin detemir (levemir) 10 units 2x daily       Current Medications (09/23/2018):  This is the current hospital active medication list Current Facility-Administered Medications  Medication Dose Route Frequency Provider Last Rate Last Dose  . 0.9 %  sodium chloride infusion   Intravenous PRN Chesley Mires, MD 10 mL/hr at 09/22/18 1400    . acetaminophen (TYLENOL) tablet 650 mg  650 mg Oral Q6H Elodia Florence., MD   650 mg at 09/23/18 1017  . albuterol (PROVENTIL) (2.5 MG/3ML) 0.083% nebulizer solution 3 mL  3 mL Inhalation Q6H PRN Chesley Mires, MD      . chlorhexidine (PERIDEX) 0.12 % solution 15 mL  15 mL Mouth Rinse BID Chesley Mires, MD   15 mL at 09/23/18 0848  . dextromethorphan-guaiFENesin (MUCINEX DM) 30-600 MG per 12 hr tablet 1 tablet  1 tablet Oral BID PRN Chesley Mires, MD   1 tablet at 09/23/18 0848  . diclofenac sodium (VOLTAREN) 1 % transdermal gel 2 g  2 g Topical QID Elodia Florence., MD      . diltiazem (CARDIZEM CD) 24 hr capsule 180 mg  180 mg Oral Daily Chesley Mires, MD   180 mg at 09/23/18 0848  . furosemide (LASIX) injection 80 mg  80 mg Intravenous Q12H Finnigan, Nancy A, DO   80 mg at 09/23/18 0123  . heparin injection 5,000 Units  5,000 Units Subcutaneous Q8H Chesley Mires, MD   5,000 Units at 09/23/18 0532  . insulin aspart (novoLOG) injection 0-15 Units  0-15 Units Subcutaneous TID WC Chesley Mires, MD   2 Units at 09/23/18 0847  . insulin aspart (novoLOG) injection 0-5 Units  0-5 Units Subcutaneous QHS Sood, Vineet, MD      . insulin detemir (LEVEMIR) injection  10 Units  10 Units Subcutaneous BID Chesley Mires, MD   10 Units at 09/23/18 0848  . ipratropium-albuterol (DUONEB) 0.5-2.5 (3) MG/3ML nebulizer solution 3 mL  3 mL Nebulization Q4H PRN Chesley Mires, MD      . levothyroxine (SYNTHROID) tablet 88 mcg  88 mcg Oral Q0600 Chesley Mires, MD   88 mcg at 09/23/18 0532  . lidocaine (LIDODERM) 5 % 1 patch  1 patch Transdermal Q24H Elodia Florence., MD      . MEDLINE mouth rinse  15 mL Mouth Rinse q12n4p Chesley Mires, MD   15 mL at 09/22/18 1736  . metoprolol tartrate (LOPRESSOR) injection 2.5 mg  2.5 mg Intravenous Q3H PRN Chesley Mires, MD      . mometasone-formoterol (DULERA) 200-5 MCG/ACT inhaler 2 puff  2 puff Inhalation BID Chesley Mires, MD   2 puff at 09/22/18 2055  . oxyCODONE (Oxy IR/ROXICODONE) immediate release tablet 10 mg  10 mg Oral Q4H PRN Elodia Florence., MD   10 mg at  09/23/18 1016  . pravastatin (PRAVACHOL) tablet 20 mg  20 mg Oral Daily Chesley Mires, MD   20 mg at 09/23/18 0848  . sodium chloride flush (NS) 0.9 % injection 10-40 mL  10-40 mL Intracatheter PRN Chesley Mires, MD         Discharge Medications: Please see discharge summary for a list of discharge medications.  Relevant Imaging Results:  Relevant Lab Results:   Additional Information SSN: [id number]  Philippa Chester Lavana Huckeba, LCSWA

## 2018-09-24 ENCOUNTER — Other Ambulatory Visit: Payer: Self-pay

## 2018-09-24 ENCOUNTER — Encounter (HOSPITAL_COMMUNITY): Payer: Self-pay

## 2018-09-24 LAB — PREPARE RBC (CROSSMATCH)

## 2018-09-24 LAB — CBC
HCT: 24.8 % — ABNORMAL LOW (ref 36.0–46.0)
Hemoglobin: 7 g/dL — ABNORMAL LOW (ref 12.0–15.0)
MCH: 24.4 pg — ABNORMAL LOW (ref 26.0–34.0)
MCHC: 28.2 g/dL — ABNORMAL LOW (ref 30.0–36.0)
MCV: 86.4 fL (ref 80.0–100.0)
Platelets: 74 10*3/uL — ABNORMAL LOW (ref 150–400)
RBC: 2.87 MIL/uL — ABNORMAL LOW (ref 3.87–5.11)
RDW: 19.3 % — ABNORMAL HIGH (ref 11.5–15.5)
WBC: 2 10*3/uL — ABNORMAL LOW (ref 4.0–10.5)
nRBC: 0 % (ref 0.0–0.2)

## 2018-09-24 LAB — COMPREHENSIVE METABOLIC PANEL
ALT: 48 U/L — ABNORMAL HIGH (ref 0–44)
AST: 26 U/L (ref 15–41)
Albumin: 3 g/dL — ABNORMAL LOW (ref 3.5–5.0)
Alkaline Phosphatase: 143 U/L — ABNORMAL HIGH (ref 38–126)
Anion gap: 8 (ref 5–15)
BUN: 85 mg/dL — ABNORMAL HIGH (ref 8–23)
CO2: 44 mmol/L — ABNORMAL HIGH (ref 22–32)
Calcium: 8.3 mg/dL — ABNORMAL LOW (ref 8.9–10.3)
Chloride: 83 mmol/L — ABNORMAL LOW (ref 98–111)
Creatinine, Ser: 1.44 mg/dL — ABNORMAL HIGH (ref 0.44–1.00)
GFR calc Af Amer: 44 mL/min — ABNORMAL LOW (ref >60)
GFR calc non Af Amer: 38 mL/min — ABNORMAL LOW (ref >60)
Glucose, Bld: 124 mg/dL — ABNORMAL HIGH (ref 70–99)
Potassium: 4.3 mmol/L (ref 3.5–5.1)
Sodium: 135 mmol/L (ref 135–145)
Total Bilirubin: 0.7 mg/dL (ref 0.3–1.2)
Total Protein: 5.4 g/dL — ABNORMAL LOW (ref 6.5–8.1)

## 2018-09-24 LAB — DIFFERENTIAL
Abs Immature Granulocytes: 0.01 10*3/uL (ref 0.00–0.07)
Basophils Absolute: 0 10*3/uL (ref 0.0–0.1)
Basophils Relative: 0 %
Eosinophils Absolute: 0 10*3/uL (ref 0.0–0.5)
Eosinophils Relative: 0 %
Immature Granulocytes: 1 %
Lymphocytes Relative: 10 %
Lymphs Abs: 0.2 10*3/uL — ABNORMAL LOW (ref 0.7–4.0)
Monocytes Absolute: 0.3 10*3/uL (ref 0.1–1.0)
Monocytes Relative: 13 %
Neutro Abs: 1.6 10*3/uL — ABNORMAL LOW (ref 1.7–7.7)
Neutrophils Relative %: 76 %

## 2018-09-24 LAB — HEMOGLOBIN AND HEMATOCRIT, BLOOD
HCT: 22.8 % — ABNORMAL LOW (ref 36.0–46.0)
HCT: 25.7 % — ABNORMAL LOW (ref 36.0–46.0)
Hemoglobin: 6.6 g/dL — CL (ref 12.0–15.0)
Hemoglobin: 7.5 g/dL — ABNORMAL LOW (ref 12.0–15.0)

## 2018-09-24 LAB — GLUCOSE, CAPILLARY
Glucose-Capillary: 196 mg/dL — ABNORMAL HIGH (ref 70–99)
Glucose-Capillary: 152 mg/dL — ABNORMAL HIGH (ref 70–99)
Glucose-Capillary: 121 mg/dL — ABNORMAL HIGH (ref 70–99)

## 2018-09-24 LAB — MAGNESIUM: Magnesium: 2 mg/dL (ref 1.7–2.4)

## 2018-09-24 LAB — ABO/RH: ABO/RH(D): A POS

## 2018-09-24 MED ORDER — FUROSEMIDE 10 MG/ML IJ SOLN
80.0000 mg | Freq: Three times a day (TID) | INTRAMUSCULAR | Status: DC
  Administered 2018-09-24: 80 mg via INTRAVENOUS

## 2018-09-24 MED ORDER — OXYCODONE HCL 5 MG PO TABS: 10.0000 mg | ORAL_TABLET | ORAL | Status: DC | PRN

## 2018-09-24 MED ORDER — SODIUM CHLORIDE 0.9% IV SOLUTION
Freq: Once | INTRAVENOUS | Status: AC
  Administered 2018-09-24: 17:00:00 via INTRAVENOUS

## 2018-09-24 MED ORDER — FUROSEMIDE 10 MG/ML IJ SOLN: 80.0000 mg | Freq: Three times a day (TID) | INTRAMUSCULAR | Status: DC

## 2018-09-24 MED ORDER — OXYCODONE HCL 5 MG PO TABS
10.0000 mg | ORAL_TABLET | Freq: Four times a day (QID) | ORAL | Status: DC | PRN
  Administered 2018-09-25 – 2018-09-28 (×6): 10 mg via ORAL
  Filled 2018-09-24 (×6): qty 2

## 2018-09-24 MED ORDER — BISACODYL 10 MG RE SUPP
10.0000 mg | Freq: Every day | RECTAL | Status: DC | PRN
  Administered 2018-09-27 – 2018-09-28 (×2): 10 mg via RECTAL
  Filled 2018-09-24 (×2): qty 1

## 2018-09-24 MED ORDER — SODIUM CHLORIDE 0.9 % IV SOLN
125.0000 mg | Freq: Once | INTRAVENOUS | Status: AC
  Administered 2018-09-24: 125 mg via INTRAVENOUS
  Filled 2018-09-24: qty 10

## 2018-09-24 MED ORDER — FUROSEMIDE 10 MG/ML IJ SOLN
80.0000 mg | Freq: Three times a day (TID) | INTRAMUSCULAR | Status: DC
  Administered 2018-09-24 – 2018-09-25 (×2): 80 mg via INTRAVENOUS
  Filled 2018-09-24 (×2): qty 8

## 2018-09-24 MED ORDER — ONDANSETRON HCL 4 MG/2ML IJ SOLN
4.0000 mg | Freq: Four times a day (QID) | INTRAMUSCULAR | Status: DC | PRN
  Administered 2018-09-24 – 2018-10-02 (×4): 4 mg via INTRAVENOUS
  Filled 2018-09-24 (×4): qty 2

## 2018-09-24 MED ORDER — PROMETHAZINE HCL 25 MG/ML IJ SOLN
12.5000 mg | Freq: Once | INTRAMUSCULAR | Status: AC | PRN
  Administered 2018-09-24: 12.5 mg via INTRAVENOUS
  Filled 2018-09-24: qty 1

## 2018-09-24 MED ORDER — NORTRIPTYLINE HCL 25 MG PO CAPS
25.0000 mg | ORAL_CAPSULE | Freq: Every day | ORAL | Status: DC
  Administered 2018-09-24 – 2018-10-05 (×12): 25 mg via ORAL
  Filled 2018-09-24 (×13): qty 1

## 2018-09-24 MED ORDER — LACTULOSE 10 GM/15ML PO SOLN
20.0000 g | Freq: Once | ORAL | Status: AC
  Administered 2018-09-24: 20 g via ORAL
  Filled 2018-09-24: qty 30

## 2018-09-24 NOTE — Progress Notes (Signed)
PROGRESS NOTE    Renee Noble  YKD:[id number] DOB: Oct 12, 1955 DOA: 10/06/2018 PCP: [name], PA-C   Brief Narrative:  63 year female with extensive history significant for but not limited to chronic hypoxic respiratory failure on 3 L oxygen at home, COPD, OHS, OSA, chronic diastolic CHF, PAF - not on AC given chronic thrombocytopenia/ recent anemia, diabetes type 2, and obesity presenting from rehab/ [facility] for altered mental status and hyperkalemia.   Recent hospitalization 4/13- 5/2 for acute on chronic respiratory failure requiring intubation twice and acute on chronic heart failure with noncompliance in wearing CPAP and fluid restriction; was COVID negative twice.  She was admitted for acute on chronic hypoxic and hypercapnic respiratory failure as well as hyperkalemia.  She was initially admitted to the ICU on bipap, does not appear she was ever intubated.   Assessment & Plan:   Active Problems:   Acute on chronic respiratory failure with hypoxia and hypercapnia (HCC)   Respiratory failure with hypercapnia (HCC)   Hyperkalemia   Acute on chronic hypoxic/hypercapnic respiratory failure. Severe COPD. OSA/OHS. - dulera, prn abluterol/duoneb - oxygen to keep SpO2 88 to 95% - Bipap qhs and prn - caution with opiates and sedating medications  HFpEF Exacerbation: Echo with normal EF. RV with severely reduced systolic function. (see report)  BNP ~1090.4 CXR on 5/6 with evidence of HF Lasix 80 IV TID (on 40 PO BID at home) Getting dosed with metolazone per nephrology Appreciate renal assistance with diuresis I/O, daily weights Repeat CXR tomorrow  Permanent atrial fibrillation. Not on anticoagulation due to chronic thrombocytopenia Continue to follow Cardizem  Hyperkalemia:  Improved Appreciate nephrology Continue lasix Cortisol 20  Acute Kidney Injury:  Baseline creatinine is <1 Improving with diuresis (peaked at 1.67) Renal following,  appreciate recs  Hx of HLD. -  pravachol  RUL pulmonary nodule. - f/u CT chest in July 2020 as outpt  DM type II. - SSI with levemir  Hypothyroidism: - synthroid  Anemia, thrombocytopenia of chronic disease and iron deficiency. - f/u CBC, stable - s/p IV iron per renal - pt constipated, hold off on PO iron  Acute metabolic encephalopathy - improved  Hx of depression, chronic pain - scheduled APAP, voltaren, lidocaine patch - resume nortriptyline   - hold MS contin for now, restart oxycodone (of note, she told me she was taking oxy 10 mg for breakthrough pain, but I don't see this on PMP aware - ? If this was from facility?)  Deconditioning. - PT/OT - recommending SNF  Elevated LFT's Follow  1/2 Blood Cx with coag neg staph, suspect contaminant, follow  DVT prophylaxis: SCD Code Status: full  Family Communication: none at bedside Disposition Plan: pending improvement   Consultants:   PCCM  Nephrology  Procedures:  IMPRESSIONS    1. The left ventricle has low normal systolic function, with an ejection fraction of 50-55%. The cavity size was normal. There is mildly increased left ventricular wall thickness. Left ventricular diastolic Doppler parameters are consistent with  indeterminate diastolic dysfunction.  2. The right ventricle has severely reduced systolic function. The cavity was severely enlarged. There is no increase in right ventricular wall thickness.  3. Left atrial size was moderately dilated.  4. Right atrial size was moderately dilated.  5. Mildly calcified tricuspid valve leaflets.  6. Moderate thickening of the mitral valve leaflet. Moderate calcification of the mitral valve leaflet. There is moderate mitral annular calcification present.  7. The aortic valve was not well  visualized Moderate thickening of the aortic valve Moderate calcification of the aortic valve. Aortic valve regurgitation is trivial by color flow Doppler.  Mild-moderate stenosis of the aortic valve.  8. The inferior vena cava was dilated in size with <50% respiratory variability.  Antimicrobials:  Anti-infectives (From admission, onward)   None      Subjective: Has been on MS contin a long time SOB feels better  Objective: Vitals:   09/24/18 0631 09/24/18 0843 09/24/18 0949 09/24/18 1131  BP: 118/65   131/60  Pulse: 80   94  Resp:    12  Temp: 98 F (36.7 C)   97.8 F (36.6 C)  TempSrc: Oral   Oral  SpO2: 99% 99%  94%  Weight:   (!) 136.4 kg   Height:   5\' 4"  (1.626 m)     Intake/Output Summary (Last 24 hours) at 09/24/2018 1416 Last data filed at 09/24/2018 0900 Gross per 24 hour  Intake 360 ml  Output 1850 ml  Net -1490 ml   Filed Weights   09/21/18 0500 09/22/18 0500 09/24/18 0949  Weight: 136 kg (!) 136.4 kg (!) 136.4 kg    Examination:  General: No acute distress. Cardiovascular: Heart sounds show a regular rate, and rhythm. Lungs: Clear to auscultation bilaterally  Abdomen: Soft, nontender, nondistended  Neurological: Alert and oriented 3. Moves all extremities 4. Cranial nerves II through XII grossly intact. Skin: Warm and dry. No rashes or lesions. Extremities: bilateral LE edema Psychiatric: Mood and affect are normal. Insight and judgment are appropriate.  Data Reviewed: I have personally reviewed following labs and imaging studies  CBC: Recent Labs  Lab 09/19/2018 2157 09/15/2018 2205 09/21/18 0322 09/22/18 0336 09/23/18 0545 09/24/18 0445  WBC 6.2  --  4.0 3.1* 3.4* 2.0*  NEUTROABS 5.4  --   --   --   --  1.6*  HGB 8.1* 9.9* 8.0* 7.3* 7.3* 7.0*  HCT 30.5* 29.0* 28.8* 26.2* 26.1* 24.8*  MCV 91.6  --  89.2 87.9 87.0 86.4  PLT 92*  --  57* 65* 79* 74*   Basic Metabolic Panel: Recent Labs  Lab 09/21/18 2159  09/22/18 0336 09/22/18 0527 09/22/18 0840 09/22/18 1728 09/23/18 0545 09/24/18 0445  NA 137  --  138  --   --  135 136 135  K 6.2*   < > 5.4* 5.3* 5.5* 5.2* 4.8 4.3  CL 87*  --   83*  --   --  83* 82* 83*  CO2 42*  --  42*  --   --  42* 43* 44*  GLUCOSE 245*  --  187*  --   --  132* 114* 124*  BUN 76*  --  78*  --   --  81* 83* 85*  CREATININE 1.47*  --  1.51*  --   --  1.55* 1.56* 1.44*  CALCIUM 8.9  --  8.9  --   --  8.7* 8.7* 8.3*  MG  --   --  2.0  --   --   --   --  2.0  PHOS  --   --  5.7*  --   --   --   --   --    < > = values in this interval not displayed.   GFR: Estimated Creatinine Clearance: 53.7 mL/min (A) (by C-G formula based on SCr of 1.44 mg/dL (H)). Liver Function Tests: Recent Labs  Lab 09/18/2018 2157 09/22/18 0336 09/24/18 0445  AST 38 62*  26  ALT 59* 84* 48*  ALKPHOS 161* 181* 143*  BILITOT 0.7 0.6 0.7  PROT 5.9* 5.5* 5.4*  ALBUMIN 3.2* 3.0* 3.0*   No results for input(s): LIPASE, AMYLASE in the last 168 hours. No results for input(s): AMMONIA in the last 168 hours. Coagulation Profile: Recent Labs  Lab 10/06/2018 2157  INR 1.3*   Cardiac Enzymes: Recent Labs  Lab 10/04/2018 2157 09/21/18 0736 09/21/18 2159  CKTOTAL  --   --  9*  TROPONINI 0.03* 0.03*  --    BNP (last 3 results) No results for input(s): PROBNP in the last 8760 hours. HbA1C: No results for input(s): HGBA1C in the last 72 hours. CBG: Recent Labs  Lab 09/23/18 0725 09/23/18 1133 09/23/18 1633 09/23/18 2143 09/24/18 1130  GLUCAP 123* 140* 169* 192* 196*   Lipid Profile: No results for input(s): CHOL, HDL, LDLCALC, TRIG, CHOLHDL, LDLDIRECT in the last 72 hours. Thyroid Function Tests: No results for input(s): TSH, T4TOTAL, FREET4, T3FREE, THYROIDAB in the last 72 hours. Anemia Panel: Recent Labs    09/22/18 1728  FERRITIN 39  TIBC 328  IRON 16*   Sepsis Labs: Recent Labs  Lab 09/15/2018 2157  LATICACIDVEN 1.5    Recent Results (from the past 240 hour(s))  Novel Coronavirus, NAA (hospital order; send-out to ref lab)     Status: None   Collection Time: 09/15/18  3:36 PM  Result Value Ref Range Status   SARS-CoV-2, NAA NOT DETECTED NOT  DETECTED Final    Comment: (NOTE) This test was developed and its performance characteristics determined by [employer]. This test has not been FDA cleared or approved. This test has been authorized by FDA under an Emergency Use Authorization (EUA). This test is only authorized for the duration of time the declaration that circumstances exist justifying the authorization of the emergency use of in vitro diagnostic tests for detection of SARS-CoV-2 virus and/or diagnosis of COVID-19 infection under section 564(b)(1) of the Act, 21 U.S.C. 161WRU-0(A)(5), unless the authorization is terminated or revoked sooner. When diagnostic testing is negative, the possibility of a false negative result should be considered in the context of a patient's recent exposures and the presence of clinical signs and symptoms consistent with COVID-19. An individual without symptoms of COVID-19 and who is not shedding SARS-CoV-2 virus would expect to have a negative (not detected) result in this assay. Performed  At: [facility] [place], [place] [id number] Rush Farmer MD NW:[id number]    [place]  Final    Comment: Performed at [facility] [address], [place], [place] [address]  Culture, blood (routine x 2)     Status: None (Preliminary result)   Collection Time: 09/16/2018  9:50 PM  Result Value Ref Range Status   Specimen Description BLOOD LEFT HAND  Final   Special Requests   Final    BOTTLES DRAWN AEROBIC AND ANAEROBIC Blood Culture adequate volume   Culture   Final    NO GROWTH 4 DAYS Performed at [facility], [place] [address] [place], [place] [address]    Report Status PENDING  Incomplete  SARS Coronavirus 2 (CEPHEID- Performed in [place] hospital lab), Hosp Order     Status: None   Collection Time: 09/29/2018  9:54 PM  Result Value Ref Range Status   SARS Coronavirus 2 NEGATIVE NEGATIVE Final    Comment:  (NOTE) If result is NEGATIVE SARS-CoV-2 target nucleic acids are NOT DETECTED. The SARS-CoV-2 RNA  is generally detectable in upper and lower  respiratory specimens during the acute phase of infection. The lowest  concentration of SARS-CoV-2 viral copies this assay can detect is 250  copies / mL. A negative result does not preclude SARS-CoV-2 infection  and should not be used as the sole basis for treatment or other  patient management decisions.  A negative result may occur with  improper specimen collection / handling, submission of specimen other  than nasopharyngeal swab, presence of viral mutation(s) within the  areas targeted by this assay, and inadequate number of viral copies  (<250 copies / mL). A negative result must be combined with clinical  observations, patient history, and epidemiological information. If result is POSITIVE SARS-CoV-2 target nucleic acids are DETECTED. The SARS-CoV-2 RNA is generally detectable in upper and lower  respiratory specimens dur ing the acute phase of infection.  Positive  results are indicative of active infection with SARS-CoV-2.  Clinical  correlation with patient history and other diagnostic information is  necessary to determine patient infection status.  Positive results do  not rule out bacterial infection or co-infection with other viruses. If result is PRESUMPTIVE POSTIVE SARS-CoV-2 nucleic acids MAY BE PRESENT.   A presumptive positive result was obtained on the submitted specimen  and confirmed on repeat testing.  While 2019 novel coronavirus  (SARS-CoV-2) nucleic acids may be present in the submitted sample  additional confirmatory testing may be necessary for epidemiological  and / or clinical management purposes  to differentiate between  SARS-CoV-2 and other Sarbecovirus currently known to infect humans.  If clinically indicated additional testing with an alternate test  methodology [phone]) is advised. The SARS-CoV-2 RNA is  generally  detectable in upper and lower respiratory sp ecimens during the acute  phase of infection. The expected result is Negative. Fact Sheet for Patients:  StrictlyIdeas.no Fact Sheet for Healthcare Providers: BankingDealers.co.za This test is not yet approved or cleared by the [place] FDA and has been authorized for detection and/or diagnosis of SARS-CoV-2 by FDA under an Emergency Use Authorization (EUA).  This EUA will remain in effect (meaning this test can be used) for the duration of the COVID-19 declaration under Section 564(b)(1) of the Act, 21 U.S.C. section 360bbb-3(b)(1), unless the authorization is terminated or revoked sooner. Performed at [facility], [place] [address] [place], [place] [address]   Culture, blood (routine x 2)     Status: Abnormal   Collection Time: 10/06/2018 10:05 PM  Result Value Ref Range Status   Specimen Description BLOOD LEFT ARM  Final   Special Requests   Final    BOTTLES DRAWN AEROBIC AND ANAEROBIC Blood Culture results may not be optimal due to an excessive volume of blood received in culture bottles   Culture  Setup Time   Final    AEROBIC BOTTLE ONLY GRAM POSITIVE COCCI CRITICAL RESULT CALLED TO, READ BACK BY AND VERIFIED WITH: PHARMD J LEDFORD [id number] AT 62 AM BY CM    Culture (A)  Final    STAPHYLOCOCCUS SPECIES (COAGULASE NEGATIVE) THE SIGNIFICANCE OF ISOLATING THIS ORGANISM FROM A SINGLE SET OF BLOOD CULTURES WHEN MULTIPLE SETS ARE DRAWN IS UNCERTAIN. PLEASE NOTIFY THE MICROBIOLOGY DEPARTMENT WITHIN ONE WEEK IF SPECIATION AND SENSITIVITIES ARE REQUIRED. Performed at [facility], [place] [address] [place], [place] [address]    Report Status 09/23/2018 FINAL  Final  Blood Culture ID Panel (Reflexed)     Status: Abnormal   Collection Time: 10/15/2018 10:05 PM  Result Value Ref Range Status   Enterococcus species NOT DETECTED NOT DETECTED Final   Listeria monocytogenes NOT  DETECTED NOT DETECTED Final   Staphylococcus species DETECTED (A) NOT DETECTED Final    Comment: Methicillin (oxacillin) resistant coagulase negative staphylococcus. Possible blood culture contaminant (unless isolated from more than one blood culture draw or clinical case suggests pathogenicity). No antibiotic treatment is indicated for blood  culture contaminants. CRITICAL RESULT CALLED TO, READ BACK BY AND VERIFIED WITH: PHARMD J LEDFORD [id number] AT 26 AM BY CM    Staphylococcus aureus (BCID) NOT DETECTED NOT DETECTED Final   Methicillin resistance DETECTED (A) NOT DETECTED Final    Comment: CRITICAL RESULT CALLED TO, READ BACK BY AND VERIFIED WITH: PHARMD J LEDFORD [id number] AT 45 AM BY CM    Streptococcus species NOT DETECTED NOT DETECTED Final   Streptococcus agalactiae NOT DETECTED NOT DETECTED Final   Streptococcus pneumoniae NOT DETECTED NOT DETECTED Final   Streptococcus pyogenes NOT DETECTED NOT DETECTED Final   Acinetobacter baumannii NOT DETECTED NOT DETECTED Final   Enterobacteriaceae species NOT DETECTED NOT DETECTED Final   Enterobacter cloacae complex NOT DETECTED NOT DETECTED Final   Escherichia coli NOT DETECTED NOT DETECTED Final   Klebsiella oxytoca NOT DETECTED NOT DETECTED Final   Klebsiella pneumoniae NOT DETECTED NOT DETECTED Final   Proteus species NOT DETECTED NOT DETECTED Final   Serratia marcescens NOT DETECTED NOT DETECTED Final   Haemophilus influenzae NOT DETECTED NOT DETECTED Final   Neisseria meningitidis NOT DETECTED NOT DETECTED Final   Pseudomonas aeruginosa NOT DETECTED NOT DETECTED Final   Candida albicans NOT DETECTED NOT DETECTED Final   Candida glabrata NOT DETECTED NOT DETECTED Final   Candida krusei NOT DETECTED NOT DETECTED Final   Candida parapsilosis NOT DETECTED NOT DETECTED Final   Candida tropicalis NOT DETECTED NOT DETECTED Final    Comment: Performed at [facility], [address], [place], [place] [address]  MRSA PCR Screening      Status: None   Collection Time: 09/21/18  2:34 AM  Result Value Ref Range Status   MRSA by PCR NEGATIVE NEGATIVE Final    Comment:        The GeneXpert MRSA Assay (FDA approved for NASAL specimens only), is one component of a comprehensive MRSA colonization surveillance program. It is not intended to diagnose MRSA infection nor to guide or monitor treatment for MRSA infections. Performed at [facility], [place] [address], [place], [place] [address]          Radiology Studies: No results found.      Scheduled Meds: . acetaminophen  650 mg Oral Q6H  . chlorhexidine  15 mL Mouth Rinse BID  . diclofenac sodium  2 g Topical QID  . diltiazem  180 mg Oral Daily  . furosemide  80 mg Intravenous Q8H  . heparin  5,000 Units Subcutaneous Q8H  . insulin aspart  0-15 Units Subcutaneous TID WC  . insulin aspart  0-5 Units Subcutaneous QHS  . insulin detemir  10 Units Subcutaneous BID  . levothyroxine  88 mcg Oral Q0600  . lidocaine  1 patch Transdermal Q24H  . mouth rinse  15 mL Mouth Rinse q12n4p  . mometasone-formoterol  2 puff Inhalation BID  . polyethylene glycol  17 g Oral BID  . pravastatin  20 mg Oral Daily   Continuous Infusions: . sodium chloride 10 mL/hr at 09/22/18 1400  . ferric gluconate (FERRLECIT/NULECIT) IV       LOS:  4 days    Time spent: over 30 min    Fayrene Helper, MD [facility] Pager AMION  If 7PM-7AM, please contact night-coverage [email or web address] Password [email or web address] 09/24/2018, 2:16 PM

## 2018-09-24 NOTE — Progress Notes (Signed)
[facility]    NEPHROLOGY PROGRESS NOTE  SUBJECTIVE: Reports breathing is improved compared to yesterday.  Continues to have significant swelling in her legs.  Denies any headaches, fevers, chills, dysuria, hematuria, or skin rash.  Denies any skin rash, arthralgias or myalgias.  All other review of systems are negative.  OBJECTIVE:  Vitals:   09/24/18 0843 09/24/18 1131  BP:  131/60  Pulse:  94  Resp:  12  Temp:  97.8 F (36.6 C)  SpO2: 99% 94%    Intake/Output Summary (Last 24 hours) at 09/24/2018 1202 Last data filed at 09/24/2018 0900 Gross per 24 hour  Intake 360 ml  Output 1850 ml  Net -1490 ml      General:  AAOx3 NAD HEENT: MMM [place] AT anicteric sclera Neck:  No JVD, no adenopathy CV:  Heart RRR  Lungs:  L/S with bibasilar Rales Abd:  abd SNT/ND with normal BS, obese, pitting edema to her pannus GU:  Bladder non-palpable Extremities: +2 bilateral lower extremity edema Skin:  No skin rash  MEDICATIONS:  . acetaminophen  650 mg Oral Q6H  . chlorhexidine  15 mL Mouth Rinse BID  . diclofenac sodium  2 g Topical QID  . diltiazem  180 mg Oral Daily  . furosemide  80 mg Intravenous Q12H  . heparin  5,000 Units Subcutaneous Q8H  . insulin aspart  0-15 Units Subcutaneous TID WC  . insulin aspart  0-5 Units Subcutaneous QHS  . insulin detemir  10 Units Subcutaneous BID  . levothyroxine  88 mcg Oral Q0600  . lidocaine  1 patch Transdermal Q24H  . mouth rinse  15 mL Mouth Rinse q12n4p  . mometasone-formoterol  2 puff Inhalation BID  . polyethylene glycol  17 g Oral BID  . pravastatin  20 mg Oral Daily       LABS:   CBC Latest Ref Rng & Units 09/24/2018 09/23/2018 09/22/2018  WBC 4.0 - 10.5 K/uL 2.0(L) 3.4(L) 3.1(L)  Hemoglobin 12.0 - 15.0 g/dL 7.0(L) 7.3(L) 7.3(L)  Hematocrit 36.0 - 46.0 % 24.8(L) 26.1(L) 26.2(L)  Platelets 150 - 400 K/uL 74(L) 79(L) 65(L)    CMP Latest Ref Rng & Units 09/24/2018 09/23/2018 09/22/2018  Glucose 70 - 99 mg/dL 124(H)  114(H) 132(H)  BUN 8 - 23 mg/dL 85(H) 83(H) 81(H)  Creatinine 0.44 - 1.00 mg/dL 1.44(H) 1.56(H) 1.55(H)  Sodium 135 - 145 mmol/L 135 136 135  Potassium 3.5 - 5.1 mmol/L 4.3 4.8 5.2(H)  Chloride 98 - 111 mmol/L 83(L) 82(L) 83(L)  CO2 22 - 32 mmol/L 44(H) 43(H) 42(H)  Calcium 8.9 - 10.3 mg/dL 8.3(L) 8.7(L) 8.7(L)  Total Protein 6.5 - 8.1 g/dL 5.4(L) - -  Total Bilirubin 0.3 - 1.2 mg/dL 0.7 - -  Alkaline Phos 38 - 126 U/L 143(H) - -  AST 15 - 41 U/L 26 - -  ALT 0 - 44 U/L 48(H) - -    Lab Results  Component Value Date   CALCIUM 8.3 (L) 09/24/2018   CAION 1.09 (L) 10/14/2018   PHOS 5.7 (H) 09/22/2018       Component Value Date/Time   COLORURINE YELLOW 08/29/2018 0950   APPEARANCEUR CLEAR 08/29/2018 0950   LABSPEC 1.014 08/29/2018 0950   PHURINE 5.0 08/29/2018 0950   GLUCOSEU NEGATIVE 08/29/2018 0950   GLUCOSEU NEGATIVE 08/17/2016 1340   HGBUR SMALL (A) 08/29/2018 0950   BILIRUBINUR NEGATIVE 08/29/2018 0950   BILIRUBINUR neg 01/15/2015 [place] 08/29/2018 0950   PROTEINUR 30 (A)  08/29/2018 0950   UROBILINOGEN 1.0 08/17/2016 1340   NITRITE NEGATIVE 08/29/2018 0950   LEUKOCYTESUR NEGATIVE 08/29/2018 0950      Component Value Date/Time   PHART 7.301 (L) 09/21/2018 2125   PCO2ART 91.0 (HH) 09/21/2018 2125   PO2ART 101 09/21/2018 2125   HCO3 43.7 (H) 09/21/2018 2125   TCO2 49 (H) 10/02/2018 2205   O2SAT 97.9 09/21/2018 2125       Component Value Date/Time   IRON 16 (L) 09/22/2018 1728   TIBC 328 09/22/2018 1728   FERRITIN 39 09/22/2018 1728   IRONPCTSAT 5 (L) 09/22/2018 1728       ASSESSMENT/PLAN:    63 year old female patient with a past medical history significant for chronic kidney disease stage III with a baseline creatinine of 1.3-1.6, diabetes, COPD, and congestive heart failure who presented with shortness of breath and acute on chronic hypercarbic respiratory failure.  She had a recent acute kidney injury event with a peak creatinine of 2.82 on  4/16.  She was markedly hyperkalemic with a potassium of 7.2 on admission, and was on potassium supplementation at home.  1.  Hyperkalemia.  Likely secondary to acute kidney injury and potassium supplementation.  Status post IV Lasix.  Did not tolerate Kayexalate enema.  Potassium has improved.  Cortisol level ok.  2.  Chronic kidney disease stage III.  Her baseline serum creatinine runs around 1.3-1.6.  She is status post nephrectomy with a solitary kidney.  Suspect related to underlying diabetes.  3.  Acute on chronic hypercarbic respiratory failure with hypoxia.  She has underlying severe COPD.  She continues BiPAP intermittently.  Continue to diurese as able.  Chest x-ray from 5/6 revealed vascular congestion and bilateral airspace opacities  4.  Chronic diastolic congestive heart failure.  She appears to be fluid overloaded.  Agree with continued diuresis.  Urinalysis from 08/29/2018 with minimal proteinuria and small blood.  Has significant more volume to diurese.  Will continue metolazone.  Monitor BUN during diuresis.  Will increase furosemide to 80 mg thrice daily.  5.  Atrial fibrillation.  On diltiazem.  Not on oral anticoagulation reportedly due to chronic thrombocytopenia  6.  Hyperphosphatemia.  Add sevelamer.  7.  Anemia.  Replete iron IV.    [place], DO, [place]

## 2018-09-25 ENCOUNTER — Inpatient Hospital Stay (HOSPITAL_COMMUNITY): Payer: Medicare Other

## 2018-09-25 DIAGNOSIS — E1140 Type 2 diabetes mellitus with diabetic neuropathy, unspecified: Secondary | ICD-10-CM

## 2018-09-25 DIAGNOSIS — M797 Fibromyalgia: Secondary | ICD-10-CM

## 2018-09-25 DIAGNOSIS — I872 Venous insufficiency (chronic) (peripheral): Secondary | ICD-10-CM

## 2018-09-25 DIAGNOSIS — I2781 Cor pulmonale (chronic): Secondary | ICD-10-CM

## 2018-09-25 DIAGNOSIS — Z905 Acquired absence of kidney: Secondary | ICD-10-CM

## 2018-09-25 DIAGNOSIS — Z85528 Personal history of other malignant neoplasm of kidney: Secondary | ICD-10-CM

## 2018-09-25 DIAGNOSIS — I50810 Right heart failure, unspecified: Secondary | ICD-10-CM

## 2018-09-25 DIAGNOSIS — E039 Hypothyroidism, unspecified: Secondary | ICD-10-CM

## 2018-09-25 DIAGNOSIS — E611 Iron deficiency: Secondary | ICD-10-CM

## 2018-09-25 DIAGNOSIS — Z87891 Personal history of nicotine dependence: Secondary | ICD-10-CM

## 2018-09-25 DIAGNOSIS — Z8619 Personal history of other infectious and parasitic diseases: Secondary | ICD-10-CM

## 2018-09-25 DIAGNOSIS — G2581 Restless legs syndrome: Secondary | ICD-10-CM

## 2018-09-25 DIAGNOSIS — E1122 Type 2 diabetes mellitus with diabetic chronic kidney disease: Secondary | ICD-10-CM

## 2018-09-25 DIAGNOSIS — J449 Chronic obstructive pulmonary disease, unspecified: Secondary | ICD-10-CM

## 2018-09-25 DIAGNOSIS — R161 Splenomegaly, not elsewhere classified: Secondary | ICD-10-CM

## 2018-09-25 DIAGNOSIS — Z888 Allergy status to other drugs, medicaments and biological substances status: Secondary | ICD-10-CM

## 2018-09-25 DIAGNOSIS — I130 Hypertensive heart and chronic kidney disease with heart failure and stage 1 through stage 4 chronic kidney disease, or unspecified chronic kidney disease: Secondary | ICD-10-CM

## 2018-09-25 DIAGNOSIS — D696 Thrombocytopenia, unspecified: Secondary | ICD-10-CM

## 2018-09-25 DIAGNOSIS — D72818 Other decreased white blood cell count: Secondary | ICD-10-CM

## 2018-09-25 DIAGNOSIS — N183 Chronic kidney disease, stage 3 (moderate): Secondary | ICD-10-CM

## 2018-09-25 DIAGNOSIS — R918 Other nonspecific abnormal finding of lung field: Secondary | ICD-10-CM

## 2018-09-25 LAB — BPAM RBC
Blood Product Expiration Date: 202005162359
ISSUE DATE / TIME: 202005101703
Unit Type and Rh: 6200

## 2018-09-25 LAB — CBC
HCT: 26.1 % — ABNORMAL LOW (ref 36.0–46.0)
Hemoglobin: 7.8 g/dL — ABNORMAL LOW (ref 12.0–15.0)
MCH: 25.3 pg — ABNORMAL LOW (ref 26.0–34.0)
MCHC: 29.9 g/dL — ABNORMAL LOW (ref 30.0–36.0)
MCV: 84.7 fL (ref 80.0–100.0)
Platelets: UNDETERMINED 10*3/uL (ref 150–400)
RBC: 3.08 MIL/uL — ABNORMAL LOW (ref 3.87–5.11)
RDW: 18.6 % — ABNORMAL HIGH (ref 11.5–15.5)
WBC: 1.5 10*3/uL — ABNORMAL LOW (ref 4.0–10.5)
nRBC: 0 % (ref 0.0–0.2)

## 2018-09-25 LAB — COMPREHENSIVE METABOLIC PANEL
ALT: 34 U/L (ref 0–44)
AST: 23 U/L (ref 15–41)
Albumin: 3 g/dL — ABNORMAL LOW (ref 3.5–5.0)
Alkaline Phosphatase: 129 U/L — ABNORMAL HIGH (ref 38–126)
Anion gap: 13 (ref 5–15)
BUN: 79 mg/dL — ABNORMAL HIGH (ref 8–23)
CO2: 42 mmol/L — ABNORMAL HIGH (ref 22–32)
Calcium: 8.3 mg/dL — ABNORMAL LOW (ref 8.9–10.3)
Chloride: 80 mmol/L — ABNORMAL LOW (ref 98–111)
Creatinine, Ser: 1.3 mg/dL — ABNORMAL HIGH (ref 0.44–1.00)
GFR calc Af Amer: 50 mL/min — ABNORMAL LOW (ref >60)
GFR calc non Af Amer: 43 mL/min — ABNORMAL LOW (ref >60)
Glucose, Bld: 95 mg/dL (ref 70–99)
Potassium: 2.8 mmol/L — ABNORMAL LOW (ref 3.5–5.1)
Sodium: 135 mmol/L (ref 135–145)
Total Bilirubin: 0.7 mg/dL (ref 0.3–1.2)
Total Protein: 5 g/dL — ABNORMAL LOW (ref 6.5–8.1)

## 2018-09-25 LAB — CULTURE, BLOOD (ROUTINE X 2)
Culture: NO GROWTH
Special Requests: ADEQUATE

## 2018-09-25 LAB — CBC WITH DIFFERENTIAL/PLATELET
Abs Immature Granulocytes: 0.02 10*3/uL (ref 0.00–0.07)
Basophils Absolute: 0 10*3/uL (ref 0.0–0.1)
Basophils Relative: 0 %
Eosinophils Absolute: 0 10*3/uL (ref 0.0–0.5)
Eosinophils Relative: 0 %
HCT: 26.5 % — ABNORMAL LOW (ref 36.0–46.0)
Hemoglobin: 7.7 g/dL — ABNORMAL LOW (ref 12.0–15.0)
Immature Granulocytes: 1 %
Lymphocytes Relative: 9 %
Lymphs Abs: 0.2 10*3/uL — ABNORMAL LOW (ref 0.7–4.0)
MCH: 24.5 pg — ABNORMAL LOW (ref 26.0–34.0)
MCHC: 29.1 g/dL — ABNORMAL LOW (ref 30.0–36.0)
MCV: 84.4 fL (ref 80.0–100.0)
Monocytes Absolute: 0.2 10*3/uL (ref 0.1–1.0)
Monocytes Relative: 13 %
Neutro Abs: 1.2 10*3/uL — ABNORMAL LOW (ref 1.7–7.7)
Neutrophils Relative %: 77 %
Platelets: 71 10*3/uL — ABNORMAL LOW (ref 150–400)
RBC: 3.14 MIL/uL — ABNORMAL LOW (ref 3.87–5.11)
RDW: 18.5 % — ABNORMAL HIGH (ref 11.5–15.5)
WBC: 1.6 10*3/uL — ABNORMAL LOW (ref 4.0–10.5)
nRBC: 1.3 % — ABNORMAL HIGH (ref 0.0–0.2)

## 2018-09-25 LAB — GLUCOSE, CAPILLARY
Glucose-Capillary: 134 mg/dL — ABNORMAL HIGH (ref 70–99)
Glucose-Capillary: 103 mg/dL — ABNORMAL HIGH (ref 70–99)
Glucose-Capillary: 140 mg/dL — ABNORMAL HIGH (ref 70–99)
Glucose-Capillary: 135 mg/dL — ABNORMAL HIGH (ref 70–99)

## 2018-09-25 LAB — TYPE AND SCREEN
ABO/RH(D): A POS
Antibody Screen: NEGATIVE
Unit division: 0

## 2018-09-25 LAB — SAVE SMEAR(SSMR), FOR PROVIDER SLIDE REVIEW

## 2018-09-25 MED ORDER — FUROSEMIDE 10 MG/ML IJ SOLN
80.0000 mg | Freq: Two times a day (BID) | INTRAMUSCULAR | Status: DC
  Administered 2018-09-25 – 2018-09-30 (×10): 80 mg via INTRAVENOUS
  Filled 2018-09-25 (×11): qty 8

## 2018-09-25 MED ORDER — TBO-FILGRASTIM 480 MCG/0.8ML ~~LOC~~ SOSY
480.0000 ug | PREFILLED_SYRINGE | Freq: Every day | SUBCUTANEOUS | Status: DC
  Administered 2018-09-26: 480 ug via SUBCUTANEOUS
  Filled 2018-09-25 (×2): qty 0.8

## 2018-09-25 MED ORDER — POTASSIUM CHLORIDE CRYS ER 20 MEQ PO TBCR
40.0000 meq | EXTENDED_RELEASE_TABLET | Freq: Three times a day (TID) | ORAL | Status: DC
  Administered 2018-09-25 – 2018-09-28 (×12): 40 meq via ORAL
  Filled 2018-09-25 (×12): qty 2

## 2018-09-25 MED ORDER — SEVELAMER CARBONATE 800 MG PO TABS
800.0000 mg | ORAL_TABLET | Freq: Three times a day (TID) | ORAL | Status: DC
  Administered 2018-09-25 – 2018-09-26 (×2): 800 mg via ORAL
  Filled 2018-09-25 (×2): qty 1

## 2018-09-25 NOTE — Consult Note (Signed)
   [facility] CM Inpatient Consult   09/25/2018  ELLE VEZINA May 11, 1956 [id number]     Patient'schart has been reviewed as benefit from her [place] and for extreme high risk score of 48%, for unplanned readmissions; with 7 day and 30 day readmissions and 5 day hospitalizations in the last 6 months. Patient had been active with [facility] the past for community care management services.  Patient was admitted for Acute on chronic hypoxic/ hypercapnic respiratory failure as well as hyperkalemia, severe COPD, OSA/OHS, HFpEF Exacerbation, was initially admitted to the ICU on bipap.  History and physical dated 09/26/2018 as follows: Patient is a 63 year female with extensive history significant for but not limited tochronic hypoxic respiratory failure on 3 L oxygen at home, COPD, OHS,OSA, chronic diastolic CHF,PAF - not on AC given chronic thrombocytopenia/ recent anemia; diabetes type 2andobesity,presenting from rehab/ [facility] for altered mental status and hyperkalemia.  Recent hospitalization 4/13- 5/2 for acute on chronic respiratory failure requiring intubation twice and acute on chronic heart failure with noncompliance in wearing CPAP and fluid restriction; was COVID negative twice.  Chart review reveals current disposition plan is for skilled  nursing facility (SNF). Patient would like to return back to [facility] where she was prior to admission.  There are no [facility] Care Management needs at this point. If disposition plan changes, please make a referral to [facility] CM for follow-up as appropriate.    For questions and additional information, please contact:  Skyann Ganim A. Khalie Wince, BSN, RN-BC [facility] Liaison Cell: [phone]

## 2018-09-25 NOTE — Plan of Care (Signed)
  Problem: Education:continued reinforcement of plan of care,. Goal: Knowledge of General Education information will improve Description Including pain rating scale, medication(s)/side effects and non-pharmacologic comfort measures Outcome: Progressing   Problem: Health Behavior/Discharge Planning: Goal: Ability to manage health-related needs will improve Outcome: Progressing

## 2018-09-25 NOTE — Consult Note (Signed)
Referral MD  Reason for Referral: Pancytopenia  Chief Complaint  Patient presents with  . Altered Mental Status  : I have a hard time moving around and breathing.  HPI: Ms. Pittsley is a very nice 63 year old white female.  She is originally from [place].  She and some family moved down here about 6 years ago.  She has COPD.  She has CHF.  By a last echocardiogram, she clearly has right-sided heart failure.  She apparently has a past history of Hepatitis C.  She says this was treated.  Back in March, apparently, her family doctor was going to refer her to hematology because of pancytopenia.  This was never done.  She has been hospitalized several times.  She said that she was over at [facility] and was intubated.  Thinks she has been to rehab.  She apparently was admitted on 10/10/2018.  At that time, her white cell count was 6.2.  Hemoglobin 8.1.  Platelet count 92,000.  Since being hospitalized, her white cell count is trended downward.  I think her hemoglobin is also trended downward.  On 09/23/2018, her white cell count 3.4.  Hemoglobin 7.3.  Platelet count 79,000.  On 510, white cell count was 2.  Hemoglobin 7.  Platelet count 74,000.  Today, her white count is 1.6.  Hemoglobin 7.7.  Platelet count 71,000.  Her electrolytes today show sodium of 135.  Potassium 2.8.  Her blood sugar is 95.  Her BUN is 79 creatinine 1.3.  Her liver function studies are okay.  She had markedly depressed iron studies.  On 09/22/2018, her ferritin was 39 with an iron saturation of only 5%.  She used to smoke.  She is to smoke a pack and half a day.  She says she stopped "many years ago."  She says she does not drink.  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  We were asked to see her because of the blood counts dropping.  Of note she had one positive blood culture for coag negative staph back on 09/16/2018.  I think this may been felt to be a contaminant.  Of note, she had a CT  of the chest back on 05/03/2018.  This was relatively unrevealing.  She has transient pulmonary nodules.  She had a mildly enlarged spleen however.  Her liver looked okay.  She is not a vegetarian.  Overall, her performance status is ECOG 3.    Past Medical History:  Diagnosis Date  . Abdominal mass of other site   . Cervical compression fracture ([place])   . CHF (congestive heart failure) ([place])   . Chronic kidney disease, stage 3 (HCC)    Borderline Stage 2-3  . Chronic lower limb pain   . COPD (chronic obstructive pulmonary disease) ([place])   . CTS (carpal tunnel syndrome)   . Depression   . Diabetes ([place])    Type II  . Fibromyalgia   . Hepatitis C    cured last year (2018)  . Hypercholesteremia   . Hypertension   . Hypothyroidism   . Morbid obesity ([place])   . Neuropathy    Diabetes  . OSA (obstructive sleep apnea)   . Osteoarthritis   . Renal cancer ([place])    Left Kidney Removed  . RLS (restless legs syndrome)   . Syncope   . Venous stasis   :  Past Surgical History:  Procedure Laterality Date  . ABDOMINAL HERNIA REPAIR     x2  .  ABDOMINAL HYSTERECTOMY    . BLADDER SUSPENSION    . CHOLECYSTECTOMY    . CYST EXCISION     Head  . INCISIONAL HERNIA REPAIR    . KNEE ARTHROSCOPY     Bilateral  . NEPHRECTOMY     Left  . TONSILLECTOMY    . TOOTH EXTRACTION    . UMBILICAL HERNIA REPAIR    . VAGINA SURGERY    . WISDOM TOOTH EXTRACTION    :   Current Facility-Administered Medications:  .  0.9 %  sodium chloride infusion, , Intravenous, PRN, Chesley Mires, MD, Last Rate: 10 mL/hr at 09/22/18 1400 .  acetaminophen (TYLENOL) tablet 650 mg, 650 mg, Oral, Q6H, Elodia Florence., MD, 650 mg at 09/25/18 0948 .  albuterol (PROVENTIL) (2.5 MG/3ML) 0.083% nebulizer solution 3 mL, 3 mL, Inhalation, Q6H PRN, Chesley Mires, MD, 3 mL at 09/24/18 1824 .  bisacodyl (DULCOLAX) suppository 10 mg, 10 mg, Rectal, Daily PRN, Elodia Florence., MD .  chlorhexidine (PERIDEX) 0.12  % solution 15 mL, 15 mL, Mouth Rinse, BID, Chesley Mires, MD, 15 mL at 09/24/18 2318 .  dextromethorphan-guaiFENesin (MUCINEX DM) 30-600 MG per 12 hr tablet 1 tablet, 1 tablet, Oral, BID PRN, Chesley Mires, MD, 1 tablet at 09/23/18 0848 .  diclofenac sodium (VOLTAREN) 1 % transdermal gel 2 g, 2 g, Topical, QID, Florene Glen, A Clint Lipps., MD .  diltiazem (CARDIZEM CD) 24 hr capsule 180 mg, 180 mg, Oral, Daily, Sood, Vineet, MD, 180 mg at 09/25/18 0947 .  furosemide (LASIX) injection 80 mg, 80 mg, Intravenous, BID, Finnigan, Nancy A, DO .  insulin aspart (novoLOG) injection 0-15 Units, 0-15 Units, Subcutaneous, TID WC, Chesley Mires, MD, 2 Units at 09/25/18 0957 .  insulin aspart (novoLOG) injection 0-5 Units, 0-5 Units, Subcutaneous, QHS, Sood, Vineet, MD .  insulin detemir (LEVEMIR) injection 10 Units, 10 Units, Subcutaneous, BID, Chesley Mires, MD, 10 Units at 09/25/18 0958 .  ipratropium-albuterol (DUONEB) 0.5-2.5 (3) MG/3ML nebulizer solution 3 mL, 3 mL, Nebulization, Q4H PRN, Chesley Mires, MD, 3 mL at 09/25/18 1524 .  levothyroxine (SYNTHROID) tablet 88 mcg, 88 mcg, Oral, Q0600, Chesley Mires, MD, 88 mcg at 09/25/18 0554 .  lidocaine (LIDODERM) 5 % 1 patch, 1 patch, Transdermal, Q24H, Florene Glen, A Clint Lipps., MD .  MEDLINE mouth rinse, 15 mL, Mouth Rinse, q12n4p, Sood, Vineet, MD, 15 mL at 09/24/18 1707 .  metoprolol tartrate (LOPRESSOR) injection 2.5 mg, 2.5 mg, Intravenous, Q3H PRN, Chesley Mires, MD .  mometasone-formoterol (DULERA) 200-5 MCG/ACT inhaler 2 puff, 2 puff, Inhalation, BID, Chesley Mires, MD, 2 puff at 09/25/18 0840 .  nortriptyline (PAMELOR) capsule 25 mg, 25 mg, Oral, QHS, Elodia Florence., MD, 25 mg at 09/24/18 2320 .  ondansetron (ZOFRAN) injection 4 mg, 4 mg, Intravenous, Q6H PRN, Elodia Florence., MD, 4 mg at 09/24/18 1825 .  oxyCODONE (Oxy IR/ROXICODONE) immediate release tablet 10 mg, 10 mg, Oral, Q6H PRN, Elodia Florence., MD, 10 mg at 09/25/18 1124 .   polyethylene glycol (MIRALAX / GLYCOLAX) packet 17 g, 17 g, Oral, BID, Elodia Florence., MD, 17 g at 09/24/18 [phone]  potassium chloride SA (K-DUR) CR tablet 40 mEq, 40 mEq, Oral, TID, Finnigan, Nancy A, DO, 40 mEq at 09/25/18 1614 .  pravastatin (PRAVACHOL) tablet 20 mg, 20 mg, Oral, Daily, Chesley Mires, MD, 20 mg at 09/25/18 0947 .  sevelamer carbonate (RENVELA) tablet 800 mg, 800 mg, Oral, TID WC, Finnigan, Nancy A, DO .  sodium chloride flush (NS) 0.9 % injection 10-40 mL, 10-40 mL, Intracatheter, PRN, Chesley Mires, MD:  . acetaminophen  650 mg Oral Q6H  . chlorhexidine  15 mL Mouth Rinse BID  . diclofenac sodium  2 g Topical QID  . diltiazem  180 mg Oral Daily  . furosemide  80 mg Intravenous BID  . insulin aspart  0-15 Units Subcutaneous TID WC  . insulin aspart  0-5 Units Subcutaneous QHS  . insulin detemir  10 Units Subcutaneous BID  . levothyroxine  88 mcg Oral Q0600  . lidocaine  1 patch Transdermal Q24H  . mouth rinse  15 mL Mouth Rinse q12n4p  . mometasone-formoterol  2 puff Inhalation BID  . nortriptyline  25 mg Oral QHS  . polyethylene glycol  17 g Oral BID  . potassium chloride  40 mEq Oral TID  . pravastatin  20 mg Oral Daily  . sevelamer carbonate  800 mg Oral TID WC  :  Allergies  Allergen Reactions  . Gabapentin Anaphylaxis  . Lyrica [Pregabalin] Shortness Of Breath    Trouble breathing  . Ketorolac Tromethamine Hives  . Lisinopril Cough  :  Family History  Problem Relation Age of Onset  . Heart attack Mother 7       Deceased  . Heart disease Mother   . Emphysema Mother   . Alcoholism Mother   . COPD Father 23       Deceased  . Emphysema Father   . Alcoholism Father   . Esophageal varices Father   . Alcoholism Paternal Grandfather   . Diabetes Maternal Grandmother   . Heart disease Maternal Grandmother   . Lung cancer Maternal Grandfather   . Emphysema Maternal Grandfather   . Brain cancer Maternal Aunt   . Diabetes Sister   . Heart  defect Sister   . Cancer Sister        was told her sister had cancer but beat it and dont know which one   . Heart defect Sister   . Obesity Son   . Breast cancer Maternal Aunt   . Colon cancer Neg Hx   . Esophageal cancer Neg Hx   :  Social History   Socioeconomic History  . Marital status: Widowed    Spouse name: Not on file  . Number of children: 1  . Years of education: Not on file  . Highest education level: Not on file  Occupational History  . Occupation: Retired  Scientific laboratory technician  . Financial resource strain: Not on file  . Food insecurity:    Worry: Not on file    Inability: Not on file  . Transportation needs:    Medical: Not on file    Non-medical: Not on file  Tobacco Use  . Smoking status: Former Smoker    Packs/day: 2.50    Years: 45.00    Pack years: 112.50    Types: Cigarettes    Last attempt to quit: 07/21/2016    Years since quitting: 2.1  . Smokeless tobacco: Never Used  Substance and Sexual Activity  . Alcohol use: No  . Drug use: No  . Sexual activity: Never  Lifestyle  . Physical activity:    Days per week: Not on file    Minutes per session: Not on file  . Stress: Not on file  Relationships  . Social connections:    Talks on phone: Not on file    Gets together: Not on file    Attends religious  service: Not on file    Active member of club or organization: Not on file    Attends meetings of clubs or organizations: Not on file    Relationship status: Not on file  . Intimate partner violence:    Fear of current or ex partner: Not on file    Emotionally abused: Not on file    Physically abused: Not on file    Forced sexual activity: Not on file  Other Topics Concern  . Not on file  Social History Narrative  . Not on file  :  Review of Systems  Constitutional: Positive for malaise/fatigue.  HENT: Negative.   Eyes: Negative.   Respiratory: Positive for shortness of breath.   Cardiovascular: Positive for orthopnea, leg swelling and PND.   Gastrointestinal: Positive for abdominal pain and nausea.  Genitourinary: Positive for frequency.  Musculoskeletal: Positive for back pain and joint pain.  Skin: Negative.   Neurological: Positive for weakness.  Endo/Heme/Allergies: Bruises/bleeds easily.      Exam: Morbidly obese white female in no obvious distress.  Head neck exam shows no ocular or oral lesions.  She has no palpable adenopathy in the neck.  There is no scleral icterus.  She has no thrush.  Lungs are with decreased breath sounds throughout all lung fields.  Cardiac exam regular rate and rhythm.  She has a 2/6 systolic ejection murmur.  Abdomen is morbidly obese.  Abdomen is soft.  Bowel sounds are present.  There is no obvious fluid wave.  Is not possible to palpate her liver or spleen tip.  Extremities shows chronic edema in her legs.  Skin exam shows scattered ecchymoses in upper lower extremities.  Neurological exam is nonfocal. Patient Vitals for the past 24 hrs:  BP Temp Temp src Pulse Resp SpO2 Weight  09/25/18 1615 - - - - - 94 % -  09/25/18 1519 (!) 116/43 (!) 97.4 F (36.3 C) Oral 77 11 95 % -  09/25/18 1312 - - - - - - 299 lb 9.7 oz (135.9 kg)  09/25/18 1200 (!) 121/46 98.1 F (36.7 C) Oral 82 18 94 % -  09/25/18 0840 - - - - - 99 % -  09/25/18 0815 (!) 133/59 97.6 F (36.4 C) Oral 94 16 98 % -  09/25/18 0500 (!) 108/53 (!) 97.4 F (36.3 C) Axillary 85 - 98 % -  09/25/18 0258 (!) 107/41 98 F (36.7 C) Oral 77 15 100 % 208 lb 8.9 oz (94.6 kg)  09/25/18 0034 (!) 112/47 98.5 F (36.9 C) Oral 80 - 98 % -  09/24/18 2323 (!) 128/56 98.5 F (36.9 C) Oral 87 19 99 % -  09/24/18 2051 109/65 (!) 97.5 F (36.4 C) Oral 82 12 90 % -  09/24/18 1958 (!) 133/56 98.2 F (36.8 C) Oral 81 14 100 % -     Recent Labs    09/25/18 0821 09/25/18 1302  WBC 1.5* 1.6*  HGB 7.8* 7.7*  HCT 26.1* 26.5*  PLT PLATELET CLUMPS NOTED ON SMEAR, UNABLE TO ESTIMATE 71*   Recent Labs    09/24/18 0445 09/25/18 0821  NA 135  135  K 4.3 2.8*  CL 83* 80*  CO2 44* 42*  GLUCOSE 124* 95  BUN 85* 79*  CREATININE 1.44* 1.30*  CALCIUM 8.3* 8.3*    Blood smear review: Mild erythrocytosis and poikilocytosis.  She has hypochromic red blood cells.  I see no teardrop cells.  I see no nucleated red blood cells.  She has no rouleaux formation.  There is no schistocytes or spherocytes.  White cells are markedly decreased in number.  She has mature polys.  Seen occasional monocyte.  There are no immature myeloid or lymphoid cells.  Platelets are decreased in number.  She has several large platelets.  Pathology: None    Assessment and Plan: Ms. Finan is a 63 year old white female.  She has significant cor pulmonale.  She has a very poor performance status.  When she came into the hospital, her white cell count was fine.  It is subsequently dropped.  Gradually this is by some medication that she has been getting.  Not sure what medication it would be.  Her medications that she is on really do not have a role in leukopenia.  Her blood smear certainly is relatively benign.  I do not see anything that would suggest a hematologic malignancy.  I think that a bone marrow biopsy would be very impossible to do on her.  As such, this is not an option for [place].  I think that a CT of the abdomen would be reasonable.  She may have splenomegaly which could act as a sequestering organ.  Given the fact that she had Hepatitis C in the past, she may have cirrhosis.  There is also a explain the thrombocytopenia.  She clearly has iron deficiency.  I will know she is got any IV iron or not.  She definitely needs a dose.  I would give her Neupogen to see if this gets her white cell count back up.  I suspect that it would.  If she does have cor pulmonale.  This might also be a factor with her blood counts.  I would suspect that she would have hepatic congestion because of poor right-sided heart function.  Again, when she came in, her white cell  count was okay.  It is dropped since her admission.  As such, this has to be iatrogenic.  We have to keep in mind that her quality of life and performance status are just not that great.  As such, I really do not think that aggressive interventions are going to be indicated with her.  It was nice talking with her.  She does have a good attitude toward everything.  We will follow along and try to help out.   Lattie Haw, MD  Psalm 27:1

## 2018-09-25 NOTE — Research (Addendum)
Late Entry For [id number]  VITAK- 19 Antibody Fast Detection Kit Study  Informed Consent  Subject Name: Renee Noble. Bowl[place]  This Patient has been consented to the above reference Clinical Trial in compliance with FDA regulations, GCP Guidelines and [facility] [facility] requirements. The informed consent and study related procedures have been explained to the subject by Study Coordinator prior to obtaining consent. The subject expressed comprehension and requirements of the study and subject involved activities. No study procedures were conducted prior to obtained consent. The patient was given ample time to review the consent form. The subject was informed that this study is for the purpose of research and that participation was completely voluntary. All questions were answered to the satisfaction of the subject. The subject voluntarily signed consent version 1.0 on [id number]. A copy of the signed consent was given to the patient and a copy was placed in the subject's medical record. The subject was thanked for their participation and contribution to science and research.   PI Dr. Brand Males, participated in the consent process and signed the consent form.  Due to the nature of this study being of minimal risk, the [facility] Informed Consent Document Checklist and Coordinator Visit Worksheet will not be utilized for the inpatient arm of this trial.  Please refer to the subject paper source binder for further information regarding todays' visit.    [name] BS, [place], [place], [place]

## 2018-09-25 NOTE — Progress Notes (Signed)
[facility]    NEPHROLOGY PROGRESS NOTE  SUBJECTIVE: Patient resting without complaints.  Continues to have significant swelling in her legs.  Denies any headaches, fevers, chills, dysuria, hematuria, or skin rash.  Denies any skin rash, arthralgias or myalgias.  All other review of systems are negative.  OBJECTIVE:  Vitals:   09/25/18 0815 09/25/18 0840  BP: (!) 133/59   Pulse: 94   Resp: 16   Temp: 97.6 F (36.4 C)   SpO2: 98% 99%    Intake/Output Summary (Last 24 hours) at 09/25/2018 1129 Last data filed at 09/25/2018 0600 Gross per 24 hour  Intake 1993.83 ml  Output 4400 ml  Net -2406.17 ml      General:  AAOx3 NAD HEENT: MMM [place] AT anicteric sclera Neck:  No JVD, no adenopathy CV:  Heart RRR  Lungs:  L/S with bibasilar Rales Abd:  abd SNT/ND with normal BS, obese, pitting edema to her pannus GU:  Bladder non-palpable Extremities: +2 bilateral lower extremity edema Skin:  No skin rash  MEDICATIONS:  . acetaminophen  650 mg Oral Q6H  . chlorhexidine  15 mL Mouth Rinse BID  . diclofenac sodium  2 g Topical QID  . diltiazem  180 mg Oral Daily  . furosemide  80 mg Intravenous Q8H  . insulin aspart  0-15 Units Subcutaneous TID WC  . insulin aspart  0-5 Units Subcutaneous QHS  . insulin detemir  10 Units Subcutaneous BID  . levothyroxine  88 mcg Oral Q0600  . lidocaine  1 patch Transdermal Q24H  . mouth rinse  15 mL Mouth Rinse q12n4p  . mometasone-formoterol  2 puff Inhalation BID  . nortriptyline  25 mg Oral QHS  . polyethylene glycol  17 g Oral BID  . potassium chloride  40 mEq Oral TID  . pravastatin  20 mg Oral Daily       LABS:   CBC Latest Ref Rng & Units 09/25/2018 09/24/2018 09/24/2018  WBC 4.0 - 10.5 K/uL 1.5(L) - -  Hemoglobin 12.0 - 15.0 g/dL 7.8(L) 7.5(L) 6.6(LL)  Hematocrit 36.0 - 46.0 % 26.1(L) 25.7(L) 22.8(L)  Platelets 150 - 400 K/uL PLATELET CLUMPS NOTED ON SMEAR, UNABLE TO ESTIMATE - -    CMP Latest Ref Rng & Units  09/25/2018 09/24/2018 09/23/2018  Glucose 70 - 99 mg/dL 95 124(H) 114(H)  BUN 8 - 23 mg/dL 79(H) 85(H) 83(H)  Creatinine 0.44 - 1.00 mg/dL 1.30(H) 1.44(H) 1.56(H)  Sodium 135 - 145 mmol/L 135 135 136  Potassium 3.5 - 5.1 mmol/L 2.8(L) 4.3 4.8  Chloride 98 - 111 mmol/L 80(L) 83(L) 82(L)  CO2 22 - 32 mmol/L 42(H) 44(H) 43(H)  Calcium 8.9 - 10.3 mg/dL 8.3(L) 8.3(L) 8.7(L)  Total Protein 6.5 - 8.1 g/dL 5.0(L) 5.4(L) -  Total Bilirubin 0.3 - 1.2 mg/dL 0.7 0.7 -  Alkaline Phos 38 - 126 U/L 129(H) 143(H) -  AST 15 - 41 U/L 23 26 -  ALT 0 - 44 U/L 34 48(H) -    Lab Results  Component Value Date   CALCIUM 8.3 (L) 09/25/2018   CAION 1.09 (L) 10/06/2018   PHOS 5.7 (H) 09/22/2018       Component Value Date/Time   COLORURINE YELLOW 08/29/2018 0950   APPEARANCEUR CLEAR 08/29/2018 0950   LABSPEC 1.014 08/29/2018 0950   PHURINE 5.0 08/29/2018 0950   GLUCOSEU NEGATIVE 08/29/2018 0950   GLUCOSEU NEGATIVE 08/17/2016 1340   HGBUR SMALL (A) 08/29/2018 [place] NEGATIVE 08/29/2018 [place]  neg 01/15/2015 [place] 08/29/2018 0950   PROTEINUR 30 (A) 08/29/2018 0950   UROBILINOGEN 1.0 08/17/2016 1340   NITRITE NEGATIVE 08/29/2018 0950   LEUKOCYTESUR NEGATIVE 08/29/2018 0950      Component Value Date/Time   PHART 7.301 (L) 09/21/2018 2125   PCO2ART 91.0 (HH) 09/21/2018 2125   PO2ART 101 09/21/2018 2125   HCO3 43.7 (H) 09/21/2018 2125   TCO2 49 (H) 10/14/2018 2205   O2SAT 97.9 09/21/2018 2125       Component Value Date/Time   IRON 16 (L) 09/22/2018 1728   TIBC 328 09/22/2018 1728   FERRITIN 39 09/22/2018 1728   IRONPCTSAT 5 (L) 09/22/2018 1728       ASSESSMENT/PLAN:    63 year old female patient with a past medical history significant for chronic kidney disease stage III with a baseline creatinine of 1.3-1.6, diabetes, COPD, and congestive heart failure who presented with shortness of breath and acute on chronic hypercarbic respiratory failure.  She had  a recent acute kidney injury event with a peak creatinine of 2.82 on 4/16.  She was markedly hyperkalemic with a potassium of 7.2 on admission, and was on potassium supplementation at home.  1.  Hyperkalemia.  Likely secondary to acute kidney injury and potassium supplementation.  Status post IV Lasix.  Did not tolerate Kayexalate enema.  Potassium has improved and is now actually low likely due to diuresis.  We will replete.  Cortisol level ok.  2.  Chronic kidney disease stage III.  Her baseline serum creatinine runs around 1.3-1.6.  She is status post nephrectomy with a solitary kidney.  Suspect related to underlying diabetes.  3.  Acute on chronic hypercarbic respiratory failure with hypoxia.  She has underlying severe COPD.  She continues BiPAP intermittently.  Continue to diurese as able.  Chest x-ray from 5/6 revealed vascular congestion and bilateral airspace opacities  4.  Chronic diastolic congestive heart failure.  She appears to be fluid overloaded.  Agree with continued diuresis.  Urinalysis from 08/29/2018 with minimal proteinuria and small blood.  Has significant more volume to diurese.  Will continue metolazone.  Monitor BUN during diuresis.  Increased furosemide to furosemide to 80 mg thrice daily on 09/24/2018.  Will decrease due to low K today  5.  Atrial fibrillation.  On diltiazem.  Not on oral anticoagulation reportedly due to chronic thrombocytopenia  6.  Hyperphosphatemia.  Add sevelamer.  7.  Anemia.  Replete iron IV.    [place], DO, [place]

## 2018-09-25 NOTE — Progress Notes (Signed)
PROGRESS NOTE    Renee Noble  WIO:[id number] DOB: [birth year] DOA: 10/13/2018 PCP: [name], PA-C   Brief Narrative:  63 year female with extensive history significant for but not limited to chronic hypoxic respiratory failure on 3 L oxygen at home, COPD, OHS, OSA, chronic diastolic CHF, PAF - not on AC given chronic thrombocytopenia/ recent anemia, diabetes type 2, and obesity presenting from rehab/ [facility] for altered mental status and hyperkalemia.   Recent hospitalization 4/13- 5/2 for acute on chronic respiratory failure requiring intubation twice and acute on chronic heart failure with noncompliance in wearing CPAP and fluid restriction; was COVID negative twice.  She was admitted for acute on chronic hypoxic and hypercapnic respiratory failure as well as hyperkalemia.  She was initially admitted to the ICU on bipap, does not appear she was ever intubated.   Assessment & Plan:   Active Problems:   Acute on chronic respiratory failure with hypoxia and hypercapnia (HCC)   Respiratory failure with hypercapnia (HCC)   Hyperkalemia   Acute on chronic hypoxic/hypercapnic respiratory failure. Severe COPD. OSA/OHS. - dulera, prn abluterol/duoneb - oxygen to keep SpO2 88 to 95% - Bipap qhs and prn - caution with opiates and sedating medications  HFpEF Exacerbation: Echo with normal EF. RV with severely reduced systolic function. (see report)  BNP ~1090.4 CXR on 5/6 with evidence of HF Lasix 80 IV TID (on 40 PO BID at home) Getting dosed with metolazone per nephrology Appreciate renal assistance with diuresis I/O, daily weights -> she notes her baseline weight is ~273.  Weight yesterday charted at 300 lbs.  todays weight likely inaccurate.  Repeat.   Repeat CXR tomorrow  Permanent atrial fibrillation. Not on anticoagulation due to chronic thrombocytopenia Continue to follow Cardizem  Hyperkalemia:  Improved Appreciate nephrology Continue lasix  Cortisol 20  Hypokalemia: replace, follow  Acute Kidney Injury:  Baseline creatinine is <1 Improving with diuresis (peaked at 1.67) Renal following, appreciate recs  Hx of HLD. -  pravachol  RUL pulmonary nodule. - f/u CT chest in July 2020 as outpt  DM type II. - SSI with levemir  Hypothyroidism: - synthroid  Anemia, thrombocytopenia of chronic disease and iron deficiency. - f/u CBC, stable - s/p IV iron per renal - s/p 1 unit pRBC - appropriate bump in Hb, pt stable  Leukopenia: WBC count 1.5 today, downtrending.  ANC yesterday 1.6.  Follow diff.  Continue to monitor.  Consider discussion with heme.  Pancytopenia: she has heme referral pending as outpatient, low threshold for c/s here.  Acute metabolic encephalopathy - improved  Hx of depression, chronic pain - scheduled APAP, voltaren, lidocaine patch - resume nortriptyline   - hold MS contin for now, restart oxycodone (of note, she told me she was taking oxy 10 mg for breakthrough pain, but I don't see this on PMP aware - ? If this was from facility?) - pain seems ok right now without any long acting meds  Deconditioning. - PT/OT - recommending SNF  Elevated LFT's Follow  1/2 Blood Cx with coag neg staph, suspect contaminant, follow  DVT prophylaxis: SCD Code Status: full  Family Communication: none at bedside Disposition Plan: pending improvement - needs continued IV diuresis   Consultants:   PCCM  Nephrology  Procedures:  IMPRESSIONS    1. The left ventricle has low normal systolic function, with an ejection fraction of 50-55%. The cavity size was normal. There is mildly increased left ventricular wall thickness. Left ventricular diastolic Doppler parameters  are consistent with  indeterminate diastolic dysfunction.  2. The right ventricle has severely reduced systolic function. The cavity was severely enlarged. There is no increase in right ventricular wall thickness.  3. Left atrial  size was moderately dilated.  4. Right atrial size was moderately dilated.  5. Mildly calcified tricuspid valve leaflets.  6. Moderate thickening of the mitral valve leaflet. Moderate calcification of the mitral valve leaflet. There is moderate mitral annular calcification present.  7. The aortic valve was not well visualized Moderate thickening of the aortic valve Moderate calcification of the aortic valve. Aortic valve regurgitation is trivial by color flow Doppler. Mild-moderate stenosis of the aortic valve.  8. The inferior vena cava was dilated in size with <50% respiratory variability.  Antimicrobials:  Anti-infectives (From admission, onward)   None      Subjective: Feels better with diuresis  Objective: Vitals:   09/25/18 0258 09/25/18 0500 09/25/18 0815 09/25/18 0840  BP: (!) 107/41 (!) 108/53 (!) 133/59   Pulse: 77 85 94   Resp: 15  16   Temp: 98 F (36.7 C) (!) 97.4 F (36.3 C) 97.6 F (36.4 C)   TempSrc: Oral Axillary Oral   SpO2: 100% 98% 98% 99%  Weight: 94.6 kg     Height:        Intake/Output Summary (Last 24 hours) at 09/25/2018 1205 Last data filed at 09/25/2018 0600 Gross per 24 hour  Intake 1993.83 ml  Output 4400 ml  Net -2406.17 ml   Filed Weights   09/22/18 0500 09/24/18 0949 09/25/18 0258  Weight: (!) 136.4 kg (!) 136.4 kg 94.6 kg    Examination:  General: No acute distress. Cardiovascular: Heart sounds show a regular rate, and rhythm.  Lungs: Clear to auscultation bilaterally  Abdomen: edema to abdomen, soft and nontender Neurological: Alert and oriented 3. Moves all extremities 4. Cranial nerves II through XII grossly intact. Skin: Warm and dry. No rashes or lesions. Extremities: bilateral LE edema Psychiatric: Mood and affect are normal. Insight and judgment are appropriate.  Data Reviewed: I have personally reviewed following labs and imaging studies  CBC: Recent Labs  Lab 10/11/2018 2157  09/21/18 0322 09/22/18 0336 09/23/18  0545 09/24/18 0445 09/24/18 1559 09/24/18 2227 09/25/18 0821  WBC 6.2  --  4.0 3.1* 3.4* 2.0*  --   --  1.5*  NEUTROABS 5.4  --   --   --   --  1.6*  --   --   --   HGB 8.1*   < > 8.0* 7.3* 7.3* 7.0* 6.6* 7.5* 7.8*  HCT 30.5*   < > 28.8* 26.2* 26.1* 24.8* 22.8* 25.7* 26.1*  MCV 91.6  --  89.2 87.9 87.0 86.4  --   --  84.7  PLT 92*  --  57* 65* 79* 74*  --   --  PLATELET CLUMPS NOTED ON SMEAR, UNABLE TO ESTIMATE   < > = values in this interval not displayed.   Basic Metabolic Panel: Recent Labs  Lab 09/22/18 0336  09/22/18 0840 09/22/18 1728 09/23/18 0545 09/24/18 0445 09/25/18 0821  NA 138  --   --  135 136 135 135  K 5.4*   < > 5.5* 5.2* 4.8 4.3 2.8*  CL 83*  --   --  83* 82* 83* 80*  CO2 42*  --   --  42* 43* 44* 42*  GLUCOSE 187*  --   --  132* 114* 124* 95  BUN 78*  --   --  81* 83* 85* 79*  CREATININE 1.51*  --   --  1.55* 1.56* 1.44* 1.30*  CALCIUM 8.9  --   --  8.7* 8.7* 8.3* 8.3*  MG 2.0  --   --   --   --  2.0  --   PHOS 5.7*  --   --   --   --   --   --    < > = values in this interval not displayed.   GFR: Estimated Creatinine Clearance: 48.2 mL/min (A) (by C-G formula based on SCr of 1.3 mg/dL (H)). Liver Function Tests: Recent Labs  Lab 10/14/2018 2157 09/22/18 0336 09/24/18 0445 09/25/18 0821  AST 38 62* 26 23  ALT 59* 84* 48* 34  ALKPHOS 161* 181* 143* 129*  BILITOT 0.7 0.6 0.7 0.7  PROT 5.9* 5.5* 5.4* 5.0*  ALBUMIN 3.2* 3.0* 3.0* 3.0*   No results for input(s): LIPASE, AMYLASE in the last 168 hours. No results for input(s): AMMONIA in the last 168 hours. Coagulation Profile: Recent Labs  Lab 09/18/2018 2157  INR 1.3*   Cardiac Enzymes: Recent Labs  Lab 09/29/2018 2157 09/21/18 0736 09/21/18 2159  CKTOTAL  --   --  9*  TROPONINI 0.03* 0.03*  --    BNP (last 3 results) No results for input(s): PROBNP in the last 8760 hours. HbA1C: No results for input(s): HGBA1C in the last 72 hours. CBG: Recent Labs  Lab 09/24/18 1130 09/24/18 1615  09/24/18 2315 09/25/18 0809 09/25/18 1155  GLUCAP 196* 152* 121* 134* 103*   Lipid Profile: No results for input(s): CHOL, HDL, LDLCALC, TRIG, CHOLHDL, LDLDIRECT in the last 72 hours. Thyroid Function Tests: No results for input(s): TSH, T4TOTAL, FREET4, T3FREE, THYROIDAB in the last 72 hours. Anemia Panel: Recent Labs    09/22/18 1728  FERRITIN 39  TIBC 328  IRON 16*   Sepsis Labs: Recent Labs  Lab 10/12/2018 2157  LATICACIDVEN 1.5    Recent Results (from the past 240 hour(s))  Novel Coronavirus, NAA (hospital order; send-out to ref lab)     Status: None   Collection Time: 09/15/18  3:36 PM  Result Value Ref Range Status   SARS-CoV-2, NAA NOT DETECTED NOT DETECTED Final    Comment: (NOTE) This test was developed and its performance characteristics determined by [employer]. This test has not been FDA cleared or approved. This test has been authorized by FDA under an Emergency Use Authorization (EUA). This test is only authorized for the duration of time the declaration that circumstances exist justifying the authorization of the emergency use of in vitro diagnostic tests for detection of SARS-CoV-2 virus and/or diagnosis of COVID-19 infection under section 564(b)(1) of the Act, 21 U.S.C. 025ENI-7(P)(8), unless the authorization is terminated or revoked sooner. When diagnostic testing is negative, the possibility of a false negative result should be considered in the context of a patient's recent exposures and the presence of clinical signs and symptoms consistent with COVID-19. An individual without symptoms of COVID-19 and who is not shedding SARS-CoV-2 virus would expect to have a negative (not detected) result in this assay. Performed  At: [facility] [place], [place] [id number] Rush Farmer MD ER:[id number]    [place]  Final    Comment: Performed at [place] [address], [place], [place] [address]  Culture, blood (routine x 2)     Status: None   Collection Time: 09/22/2018  9:50 PM  Result Value Ref Range  Status   Specimen Description BLOOD LEFT HAND  Final   Special Requests   Final    BOTTLES DRAWN AEROBIC AND ANAEROBIC Blood Culture adequate volume   Culture   Final    NO GROWTH 5 DAYS Performed at [facility], [address] [place], [place] [address]    Report Status 09/25/2018 FINAL  Final  SARS Coronavirus 2 (CEPHEID- Performed in [place] hospital lab), Hosp Order     Status: None   Collection Time: 09/21/2018  9:54 PM  Result Value Ref Range Status   SARS Coronavirus 2 NEGATIVE NEGATIVE Final    Comment: (NOTE) If result is NEGATIVE SARS-CoV-2 target nucleic acids are NOT DETECTED. The SARS-CoV-2 RNA is generally detectable in upper and lower  respiratory specimens during the acute phase of infection. The lowest  concentration of SARS-CoV-2 viral copies this assay can detect is 250  copies / mL. A negative result does not preclude SARS-CoV-2 infection  and should not be used as the sole basis for treatment or other  patient management decisions.  A negative result may occur with  improper specimen collection / handling, submission of specimen other  than nasopharyngeal swab, presence of viral mutation(s) within the  areas targeted by this assay, and inadequate number of viral copies  (<250 copies / mL). A negative result must be combined with clinical  observations, patient history, and epidemiological information. If result is POSITIVE SARS-CoV-2 target nucleic acids are DETECTED. The SARS-CoV-2 RNA is generally detectable in upper and lower  respiratory specimens dur ing the acute phase of infection.  Positive  results are indicative of active infection with SARS-CoV-2.  Clinical  correlation with patient history and other diagnostic information is  necessary to determine patient infection status.  Positive results do  not rule  out bacterial infection or co-infection with other viruses. If result is PRESUMPTIVE POSTIVE SARS-CoV-2 nucleic acids MAY BE PRESENT.   A presumptive positive result was obtained on the submitted specimen  and confirmed on repeat testing.  While 2019 novel coronavirus  (SARS-CoV-2) nucleic acids may be present in the submitted sample  additional confirmatory testing may be necessary for epidemiological  and / or clinical management purposes  to differentiate between  SARS-CoV-2 and other Sarbecovirus currently known to infect humans.  If clinically indicated additional testing with an alternate test  methodology [id number]) is advised. The SARS-CoV-2 RNA is generally  detectable in upper and lower respiratory sp ecimens during the acute  phase of infection. The expected result is Negative. Fact Sheet for Patients:  StrictlyIdeas.no Fact Sheet for Healthcare Providers: BankingDealers.co.za This test is not yet approved or cleared by the [place] FDA and has been authorized for detection and/or diagnosis of SARS-CoV-2 by FDA under an Emergency Use Authorization (EUA).  This EUA will remain in effect (meaning this test can be used) for the duration of the COVID-19 declaration under Section 564(b)(1) of the Act, 21 U.S.C. section 360bbb-3(b)(1), unless the authorization is terminated or revoked sooner. Performed at [facility], [place] [address], [place], [place] [address]   Culture, blood (routine x 2)     Status: Abnormal   Collection Time: 10/13/2018 10:05 PM  Result Value Ref Range Status   Specimen Description BLOOD LEFT ARM  Final   Special Requests   Final    BOTTLES DRAWN AEROBIC AND ANAEROBIC Blood Culture results may not be optimal due to an excessive volume of blood received in culture bottles   Culture  Setup Time   Final    AEROBIC BOTTLE ONLY GRAM POSITIVE COCCI CRITICAL RESULT CALLED TO, READ BACK BY AND VERIFIED WITH:  PHARMD J LEDFORD [id number] AT 68 AM BY CM    Culture (A)  Final    STAPHYLOCOCCUS SPECIES (COAGULASE NEGATIVE) THE SIGNIFICANCE OF ISOLATING THIS ORGANISM FROM A SINGLE SET OF BLOOD CULTURES WHEN MULTIPLE SETS ARE DRAWN IS UNCERTAIN. PLEASE NOTIFY THE MICROBIOLOGY DEPARTMENT WITHIN ONE WEEK IF SPECIATION AND SENSITIVITIES ARE REQUIRED. Performed at [facility], [place] [address] [place], [place] [address]    Report Status 09/23/2018 FINAL  Final  Blood Culture ID Panel (Reflexed)     Status: Abnormal   Collection Time: 09/29/2018 10:05 PM  Result Value Ref Range Status   Enterococcus species NOT DETECTED NOT DETECTED Final   Listeria monocytogenes NOT DETECTED NOT DETECTED Final   Staphylococcus species DETECTED (A) NOT DETECTED Final    Comment: Methicillin (oxacillin) resistant coagulase negative staphylococcus. Possible blood culture contaminant (unless isolated from more than one blood culture draw or clinical case suggests pathogenicity). No antibiotic treatment is indicated for blood  culture contaminants. CRITICAL RESULT CALLED TO, READ BACK BY AND VERIFIED WITH: PHARMD J LEDFORD [id number] AT 55 AM BY CM    Staphylococcus aureus (BCID) NOT DETECTED NOT DETECTED Final   Methicillin resistance DETECTED (A) NOT DETECTED Final    Comment: CRITICAL RESULT CALLED TO, READ BACK BY AND VERIFIED WITH: PHARMD J LEDFORD [id number] AT 22 AM BY CM    Streptococcus species NOT DETECTED NOT DETECTED Final   Streptococcus agalactiae NOT DETECTED NOT DETECTED Final   Streptococcus pneumoniae NOT DETECTED NOT DETECTED Final   Streptococcus pyogenes NOT DETECTED NOT DETECTED Final   Acinetobacter baumannii NOT DETECTED NOT DETECTED Final   Enterobacteriaceae species NOT DETECTED NOT DETECTED Final   Enterobacter cloacae complex NOT DETECTED NOT DETECTED Final   Escherichia coli NOT DETECTED NOT DETECTED Final   Klebsiella oxytoca NOT DETECTED NOT DETECTED Final   Klebsiella pneumoniae NOT DETECTED  NOT DETECTED Final   Proteus species NOT DETECTED NOT DETECTED Final   Serratia marcescens NOT DETECTED NOT DETECTED Final   Haemophilus influenzae NOT DETECTED NOT DETECTED Final   Neisseria meningitidis NOT DETECTED NOT DETECTED Final   Pseudomonas aeruginosa NOT DETECTED NOT DETECTED Final   Candida albicans NOT DETECTED NOT DETECTED Final   Candida glabrata NOT DETECTED NOT DETECTED Final   Candida krusei NOT DETECTED NOT DETECTED Final   Candida parapsilosis NOT DETECTED NOT DETECTED Final   Candida tropicalis NOT DETECTED NOT DETECTED Final    Comment: Performed at [facility], [address] [place], [place] [address]  MRSA PCR Screening     Status: None   Collection Time: 09/21/18  2:34 AM  Result Value Ref Range Status   MRSA by PCR NEGATIVE NEGATIVE Final    Comment:        The GeneXpert MRSA Assay (FDA approved for NASAL specimens only), is one component of a comprehensive MRSA colonization surveillance program. It is not intended to diagnose MRSA infection nor to guide or monitor treatment for MRSA infections. Performed at [facility], [place] [address], [place], [place] [address]          Radiology Studies: Dg Chest 2 View  Result Date: 09/25/2018 CLINICAL DATA:  Heart failure EXAM: CHEST - 2 VIEW COMPARISON:  09/30/2018 FINDINGS: Cardiac enlargement. Pulmonary vascular congestion. Progression of diffuse bilateral airspace disease with particular prominence in the left upper  lobe. Small bilateral effusions. Atherosclerotic aortic arch. IMPRESSION: Progression of congestive heart failure with edema and small effusions. Electronically Signed   By: Franchot Gallo M.D.   On: 09/25/2018 07:33        Scheduled Meds: . acetaminophen  650 mg Oral Q6H  . chlorhexidine  15 mL Mouth Rinse BID  . diclofenac sodium  2 g Topical QID  . diltiazem  180 mg Oral Daily  . furosemide  80 mg Intravenous BID  . insulin aspart  0-15 Units Subcutaneous TID WC  .  insulin aspart  0-5 Units Subcutaneous QHS  . insulin detemir  10 Units Subcutaneous BID  . levothyroxine  88 mcg Oral Q0600  . lidocaine  1 patch Transdermal Q24H  . mouth rinse  15 mL Mouth Rinse q12n4p  . mometasone-formoterol  2 puff Inhalation BID  . nortriptyline  25 mg Oral QHS  . polyethylene glycol  17 g Oral BID  . potassium chloride  40 mEq Oral TID  . pravastatin  20 mg Oral Daily  . sevelamer carbonate  800 mg Oral TID WC   Continuous Infusions: . sodium chloride 10 mL/hr at 09/22/18 1400     LOS: 5 days    Time spent: over 30 min    Fayrene Helper, MD [facility] Pager AMION  If 7PM-7AM, please contact night-coverage [email or web address] Password [facility] Meeting 09/25/2018, 12:05 PM

## 2018-09-25 NOTE — Care Management Important Message (Signed)
Important Message  Patient Details  Name: Renee Noble MRN: [id number] Date of Birth: [birth year]   Medicare Important Message Given:  Yes    Zenon Mayo, RN 09/25/2018, 4:02 PM

## 2018-09-26 ENCOUNTER — Inpatient Hospital Stay (HOSPITAL_COMMUNITY): Payer: Medicare Other

## 2018-09-26 DIAGNOSIS — R188 Other ascites: Secondary | ICD-10-CM

## 2018-09-26 DIAGNOSIS — J90 Pleural effusion, not elsewhere classified: Secondary | ICD-10-CM

## 2018-09-26 LAB — BASIC METABOLIC PANEL
Anion gap: 6 (ref 5–15)
BUN: 68 mg/dL — ABNORMAL HIGH (ref 8–23)
CO2: 48 mmol/L — ABNORMAL HIGH (ref 22–32)
Calcium: 8.5 mg/dL — ABNORMAL LOW (ref 8.9–10.3)
Chloride: 82 mmol/L — ABNORMAL LOW (ref 98–111)
Creatinine, Ser: 1.22 mg/dL — ABNORMAL HIGH (ref 0.44–1.00)
GFR calc Af Amer: 54 mL/min — ABNORMAL LOW (ref >60)
GFR calc non Af Amer: 46 mL/min — ABNORMAL LOW (ref >60)
Glucose, Bld: 152 mg/dL — ABNORMAL HIGH (ref 70–99)
Potassium: 3 mmol/L — ABNORMAL LOW (ref 3.5–5.1)
Sodium: 136 mmol/L (ref 135–145)

## 2018-09-26 LAB — CBC WITH DIFFERENTIAL/PLATELET
Abs Immature Granulocytes: 0 10*3/uL (ref 0.00–0.07)
Band Neutrophils: 9 %
Basophils Absolute: 0 10*3/uL (ref 0.0–0.1)
Basophils Relative: 0 %
Eosinophils Absolute: 0 10*3/uL (ref 0.0–0.5)
Eosinophils Relative: 1 %
HCT: 26.6 % — ABNORMAL LOW (ref 36.0–46.0)
Hemoglobin: 7.8 g/dL — ABNORMAL LOW (ref 12.0–15.0)
Lymphocytes Relative: 11 %
Lymphs Abs: 0.2 10*3/uL — ABNORMAL LOW (ref 0.7–4.0)
MCH: 25.1 pg — ABNORMAL LOW (ref 26.0–34.0)
MCHC: 29.3 g/dL — ABNORMAL LOW (ref 30.0–36.0)
MCV: 85.5 fL (ref 80.0–100.0)
Monocytes Absolute: 0.2 10*3/uL (ref 0.1–1.0)
Monocytes Relative: 9 %
Neutro Abs: 1.4 10*3/uL — ABNORMAL LOW (ref 1.7–7.7)
Neutrophils Relative %: 70 %
Platelets: 74 10*3/uL — ABNORMAL LOW (ref 150–400)
RBC: 3.11 MIL/uL — ABNORMAL LOW (ref 3.87–5.11)
RDW: 19.3 % — ABNORMAL HIGH (ref 11.5–15.5)
WBC: 1.8 10*3/uL — ABNORMAL LOW (ref 4.0–10.5)
nRBC: 0 % (ref 0.0–0.2)

## 2018-09-26 LAB — COMPREHENSIVE METABOLIC PANEL
ALT: 28 U/L (ref 0–44)
AST: 16 U/L (ref 15–41)
Albumin: 2.9 g/dL — ABNORMAL LOW (ref 3.5–5.0)
Alkaline Phosphatase: 115 U/L (ref 38–126)
Anion gap: 17 — ABNORMAL HIGH (ref 5–15)
BUN: 73 mg/dL — ABNORMAL HIGH (ref 8–23)
CO2: 44 mmol/L — ABNORMAL HIGH (ref 22–32)
Calcium: 8.6 mg/dL — ABNORMAL LOW (ref 8.9–10.3)
Chloride: 75 mmol/L — ABNORMAL LOW (ref 98–111)
Creatinine, Ser: 1.17 mg/dL — ABNORMAL HIGH (ref 0.44–1.00)
GFR calc Af Amer: 57 mL/min — ABNORMAL LOW (ref >60)
GFR calc non Af Amer: 49 mL/min — ABNORMAL LOW (ref >60)
Glucose, Bld: 104 mg/dL — ABNORMAL HIGH (ref 70–99)
Potassium: 2.6 mmol/L — CL (ref 3.5–5.1)
Sodium: 136 mmol/L (ref 135–145)
Total Bilirubin: 0.7 mg/dL (ref 0.3–1.2)
Total Protein: 5 g/dL — ABNORMAL LOW (ref 6.5–8.1)

## 2018-09-26 LAB — GLUCOSE, CAPILLARY
Glucose-Capillary: 125 mg/dL — ABNORMAL HIGH (ref 70–99)
Glucose-Capillary: 145 mg/dL — ABNORMAL HIGH (ref 70–99)
Glucose-Capillary: 147 mg/dL — ABNORMAL HIGH (ref 70–99)
Glucose-Capillary: 145 mg/dL — ABNORMAL HIGH (ref 70–99)
Glucose-Capillary: 134 mg/dL — ABNORMAL HIGH (ref 70–99)

## 2018-09-26 LAB — LACTATE DEHYDROGENASE: LDH: 97 U/L — ABNORMAL LOW (ref 98–192)

## 2018-09-26 LAB — HIV ANTIBODY (ROUTINE TESTING W REFLEX): HIV Screen 4th Generation wRfx: NONREACTIVE

## 2018-09-26 LAB — MAGNESIUM: Magnesium: 1.6 mg/dL — ABNORMAL LOW (ref 1.7–2.4)

## 2018-09-26 MED ORDER — LORAZEPAM 0.5 MG PO TABS
0.5000 mg | ORAL_TABLET | Freq: Every day | ORAL | Status: DC | PRN
  Administered 2018-09-26: 0.5 mg via ORAL
  Filled 2018-09-26: qty 1

## 2018-09-26 MED ORDER — LACTULOSE 10 GM/15ML PO SOLN
20.0000 g | Freq: Every day | ORAL | Status: DC | PRN
  Administered 2018-10-01: 09:00:00 20 g via ORAL
  Filled 2018-09-26: qty 30

## 2018-09-26 MED ORDER — POTASSIUM CHLORIDE 10 MEQ/100ML IV SOLN
10.0000 meq | INTRAVENOUS | Status: AC
  Administered 2018-09-26 (×2): 10 meq via INTRAVENOUS
  Filled 2018-09-26 (×2): qty 100

## 2018-09-26 MED ORDER — MAGNESIUM SULFATE 2 GM/50ML IV SOLN
2.0000 g | Freq: Once | INTRAVENOUS | Status: AC
  Administered 2018-09-26: 2 g via INTRAVENOUS
  Filled 2018-09-26: qty 50

## 2018-09-26 MED ORDER — POTASSIUM CHLORIDE CRYS ER 20 MEQ PO TBCR
40.0000 meq | EXTENDED_RELEASE_TABLET | Freq: Once | ORAL | Status: AC
  Administered 2018-09-26: 40 meq via ORAL
  Filled 2018-09-26: qty 2

## 2018-09-26 NOTE — Progress Notes (Addendum)
I am very much surprised by the results of the CT scan.  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  She has a large right renal mass.  She has ascites.  She has splenomegaly.  She has bilateral pleural effusions.  One has to believe that she has a couple malignancies.  The problem now is the fact that she is not a candidate for any intervention with respect to surgery or chemotherapy.  I talked her about this this morning.  I told her that it looks like the CAT scans do show cancer.  Were not definitive about this.  I suspect that we will probably get a need to get a biopsy somehow.  I would think that the mass of the head of the pancreas could be obtained by EUS.  I am unsure if gastroenterology would feel comfortable doing a upper endoscopy on her given her overall condition.  As far as the right kidney mass.  Maybe a percutaneous biopsy could be done.  Again, I do not think she is a candidate for any intervention with respect to either malignancies.  Maybe, for the pancreatic mass, she could have SBRT.  She has a lot of fluid on her scans.  I suspect that her cytopenias are probably from the splenomegaly.  Again she is iron deficient.  I started her on Granix.  Her white cell count today is 1.8.  Hemoglobin 7.8.  Platelet count 74,000.  Her renal function is better.  Her creatinine is 1.17.  BUN is 73.  Potassium 2.6.  I am not sure if she could do a MRI of the abdomen.  This may not be a bad idea to better delineate the renal issue.  Overall, I think that we have "uncovered" much bigger issues for her.  Again I realize she has a bad cor pulmonale.  Nothing can be done for this.  I really think we are looking at quality of life issues now.  We will continue to follow along.  Again, I think we are "stuck" and we really not being able to do anything for her likely malignancies.  At least biopsies could be attempted to try to confirm cancer.   Lattie Haw, MD  [place] 23:25

## 2018-09-26 NOTE — Progress Notes (Signed)
Occupational Therapy Treatment Patient Details Name: Renee Noble MRN: [id number] DOB: May 24, 1955 Today's Date: 09/26/2018    History of present illness 63 year female with extensive history significant for but not limited to chronic hypoxic respiratory failure on 3 L oxygen at home, COPD, OHS, OSA, chronic diastolic CHF, PAF - not on AC given chronic thrombocytopenia/ recent anemia, diabetes type 2, and obesity presenting from rehab/ [facility] for altered mental status and hyperkalemia.  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     OT comments  Pt agreeable to perform bed level exercises. Pt requiring Total A +2 to reposition in the bed; optimizing position in bed for pt comfort. Pt performing 5 reps of shoulder horizontal abduction/adduction with level 1 theraband and 10 reps of right elbow flexion with level 1 therband; pt unabel to perform left elbow flexion due to fatigue. Applying lotion to bilateral feet. Pt reports she is planning on dc to "respite care" and is agreeable to therapy within hospital.    Follow Up Recommendations  SNF;Other (comment)(Hospice care)    Equipment Recommendations  (TBA)    Recommendations for Other Services PT consult    Precautions / Restrictions Precautions Precautions: Fall Precaution Comments: Recent diagnosis of cancer       Mobility Bed Mobility Overal bed mobility: Needs Assistance             General bed mobility comments: Total A +2 for repositioning in bed and positioned to pt's comfort and to maximize HOB elevated for upright posture  Transfers                 General transfer comment: Pt requesting bedlevel today.    Balance                                           ADL either performed or assessed with clinical judgement   ADL Overall ADL's : Needs assistance/impaired             Lower Body Bathing: Total  assistance Lower Body Bathing Details (indicate cue type and reason): Total A for applying lotion to feet                       General ADL Comments: Focused session on repositioning in bed and UE exercises     Vision       Perception     Praxis      Cognition Arousal/Alertness: Awake/alert Behavior During Therapy: WFL for tasks assessed/performed Overall Cognitive Status: Within Functional Limits for tasks assessed                                 General Comments: Agreeable to bed level activity for ROM.         Exercises Exercises: General Upper Extremity General Exercises - Upper Extremity Shoulder Horizontal ABduction: AROM;Both;Supine;Theraband;5 reps(chair position in bed) Theraband Level (Shoulder Horizontal Abduction): Level 1 (Yellow) Shoulder Horizontal ADduction: AROM;Both;5 reps;Supine;Theraband(chair position) Theraband Level (Shoulder Horizontal Adduction): Level 1 (Yellow) Elbow Flexion: AAROM;Right;10 reps;Supine;Theraband;Limitations Theraband Level (Elbow Flexion): Level 1 (Yellow) Elbow Flexion Limitations: Unable to perform left side due to fatigue   Shoulder Instructions       General Comments VSS throughout    Pertinent Vitals/  Pain       Pain Assessment: Faces Faces Pain Scale: Hurts even more Pain Location: back and neck Pain Descriptors / Indicators: Constant Pain Intervention(s): Monitored during session;Repositioned  Home Living                                          Prior Functioning/Environment              Frequency  Min 2X/week        Progress Toward Goals  OT Goals(current goals can now be found in the care plan section)  Progress towards OT goals: Not progressing toward goals - comment(Recent cancer diagnosis)  Acute Rehab OT Goals Patient Stated Goal: "Go to respite care OT Goal Formulation: With patient Time For Goal Achievement: November 02, 2018 Potential to Achieve Goals:  Good ADL Goals Pt Will Perform Grooming: with min assist;sitting Pt Will Transfer to Toilet: with max assist;with +2 assist;stand pivot transfer;bedside commode Pt Will Perform Toileting - Clothing Manipulation and hygiene: with max assist;sitting/lateral leans;sit to/from stand Additional ADL Goal #1: Pt will perform bed mobility with Mod A +2 in preparation for ADLs Additional ADL Goal #2: Pt will tolerate sitting at EOB for 5 minutes with Min A in preparation for ADLs  Plan Discharge plan needs to be updated    Co-evaluation    PT/OT/SLP Co-Evaluation/Treatment: Yes Reason for Co-Treatment: Other (comment)(Dove tail for repositioning in bed)          AM-PAC OT "6 Clicks" Daily Activity     Outcome Measure   Help from another person eating meals?: None Help from another person taking care of personal grooming?: A Lot Help from another person toileting, which includes using toliet, bedpan, or urinal?: Total Help from another person bathing (including washing, rinsing, drying)?: A Lot Help from another person to put on and taking off regular upper body clothing?: A Lot Help from another person to put on and taking off regular lower body clothing?: Total 6 Click Score: 12    End of Session Equipment Utilized During Treatment: Oxygen  OT Visit Diagnosis: Unsteadiness on feet (R26.81);Other abnormalities of gait and mobility (R26.89);Muscle weakness (generalized) (M62.81);Pain Pain - part of body: (Lower back and neck)   Activity Tolerance Patient limited by fatigue   Patient Left in bed;with call bell/phone within reach   Nurse Communication Mobility status        Time: 9470-7615 OT Time Calculation (min): 11 min  Charges: OT General Charges $OT Visit: 1 Visit OT Treatments $Therapeutic Exercise: 8-22 mins  [place], OTR/L Acute Rehab Pager: [phone] Office: [place] 09/26/2018, 10:58 AM

## 2018-09-26 NOTE — Progress Notes (Signed)
No indication when 24 hour FR was started so will start 24 hour FR at midnight and go from midnight to midnight. Will make day shift aware in the morning

## 2018-09-26 NOTE — Consult Note (Signed)
[facility] Consult: 9:49 AM 09/27/2018  LOS: 7 days    Referring Provider: Dr Janyce Llanos  Primary Care Physician:  [name], PA-C Primary Gastroenterologist:  Dr. Alonza Bogus in [place]>> Dr Bryan Lemma 01/2018.      Reason for Consultation:  Pancreatic mass.     HPI: Renee Noble is a 63 y.o. female.  PMH obesity.  Chronic pain syndrome.  COPD.  OSA.  OHS.  Diastolic CHF.  PAF, not on anticoagulation due to anemia and thrombocytopenia.  Type II IDDM.  CKD stage III.  Hepatitis C, treated with Sovaldi/Ribaviran 2015, SVR on labs 2017.  2016 CT suspicious for cirrhosis and stable, marked splenomegaly.  Chronic thrombocytopenia with readings as low as 56 in 2017.  S/p nephrectomy, cholecystectomy, hernia repair, hysterectomy 02/2014 colonoscopy.  Colon cancer screening.,  Periodic minor hematochezia.   Poor prep, good bit of solid stool present, case aborted.  Plan was to repeat colonoscopy with 2-day prep. 02/2014 EGD.  Irregular squamocolumnar junction, biopsy C/W Barrett's mucosa.  No dysplasia or malignancy.  Mild gastritis, CLO tested results not found.  Dr. Bryan Lemma added Reglan, for no more than 8 weeks, after only OV in 2019 for c/o N/V.  He queried diabetic gastroparesis.  She declined any further work-up of liver disease.  4/13 through 09/16/2018 admission for acute on chronic respiratory failure requiring intubation twice.  Problems developed as a result of noncompliance with CPAP, fluid restriction.  Tested COVID negative twice during the admission.  Other issues included hyperkalemia, received PRBC x 1 for Hgb 6.9  Readmitted last week with hyperkalemia, confusion, acute on chronic hypercarbic respiratory failure/hypoxia, CHF, pancytopenia.  Echocardiogram shows severely reduced right  ventricular function.  09/26/2018 CT abdomen/pelvis without contrast.  Mass at the head of the pancreas, question primary pancreatic neoplasm versus metastatic disease/adenopathy.  Cirrhosis. Splenomegaly.  Ascites, anasarca.  Portal hypertension.  Mass right kidney bilateral pleural effusions.    LFTs, coags mostly nml except low albumin and now resolved elevated alk phos. Hgb low of 6.6 on 5/10 >> 1 U PRBC >> 7.5 >> 8.8.     Pt endorses nausea chronically for 2 months.  It is worse with meals.  If she takes antiemetics and waits 30 minutes she can tolerate p.o.  Only vomited once per her recall.  Moves her bowels every other day which is her normal pattern.  Some postprandial abdominal pain.  No dysuria, no change in urinary habits or appearance.  No history of jaundice.  Factor for hepatitis C appears to be a home/amateur tattoos at age 4.  Never used IV drugs, snorted illicit drugs or had blood transfusion. The shortness of breath present on arrival has improved.     Past Medical History:  Diagnosis Date  . Abdominal mass of other site   . Cervical compression fracture ([place])   . CHF (congestive heart failure) ([place])   . Chronic kidney disease, stage 3 (HCC)    Borderline Stage 2-3  . Chronic lower limb pain   . COPD (chronic obstructive pulmonary disease) ([place])   .  CTS (carpal tunnel syndrome)   . Depression   . Diabetes ([place])    Type II  . Fibromyalgia   . Hepatitis C    cured last year (2018)  . Hypercholesteremia   . Hypertension   . Hypothyroidism   . Morbid obesity ([place])   . Neuropathy    Diabetes  . OSA (obstructive sleep apnea)   . Osteoarthritis   . Renal cancer ([place])    Left Kidney Removed  . RLS (restless legs syndrome)   . Syncope   . Venous stasis     Past Surgical History:  Procedure Laterality Date  . ABDOMINAL HERNIA REPAIR     x2  . ABDOMINAL HYSTERECTOMY    . BLADDER SUSPENSION    . CHOLECYSTECTOMY    . CYST EXCISION     Head  . INCISIONAL  HERNIA REPAIR    . KNEE ARTHROSCOPY     Bilateral  . NEPHRECTOMY     Left  . TONSILLECTOMY    . TOOTH EXTRACTION    . UMBILICAL HERNIA REPAIR    . VAGINA SURGERY    . WISDOM TOOTH EXTRACTION      Prior to Admission medications   Medication Sig Start Date End Date Taking? Authorizing Provider  albuterol (PROAIR HFA) 108 (90 Base) MCG/ACT inhaler Inhale 1-2 puffs into the lungs every 6 (six) hours as needed for wheezing or shortness of breath. 01/20/18  Yes Tanda Rockers, MD  arformoterol (BROVANA) 15 MCG/2ML NEBU Take 2 mLs (15 mcg total) by nebulization 2 (two) times daily. 09/16/18  Yes Pahwani, Einar Grad, MD  budesonide-formoterol (SYMBICORT) 160-4.5 MCG/ACT inhaler Inhale 2 puffs into the lungs 2 (two) times daily. 06/29/18  Yes Tanda Rockers, MD  clotrimazole-betamethasone (LOTRISONE) cream Apply 1 application topically 2 (two) times daily as needed. Patient taking differently: Apply 1 application topically 2 (two) times daily as needed (yeast).  05/02/18  Yes [name], PA-C  dextromethorphan-guaiFENesin [facility] DM) 30-600 MG 12hr tablet Take 1 tablet by mouth 2 (two) times daily as needed for cough. 06/15/18  Yes Hosie Poisson, MD  diltiazem (CARDIZEM CD) 180 MG 24 hr capsule Take 1 capsule (180 mg total) by mouth daily. 06/02/18  Yes [name], PA-C  furosemide (LASIX) 40 MG tablet Take 1 tablet (40 mg total) by mouth 2 (two) times daily. 09/16/18  Yes Pahwani, Einar Grad, MD  insulin detemir (LEVEMIR) 100 UNIT/ML injection Inject 0.1 mLs (10 Units total) into the skin 2 (two) times daily. 09/16/18  Yes Pahwani, Einar Grad, MD  insulin lispro (HUMALOG) 100 UNIT/ML injection Inject 0.15 mLs (15 Units total) into the skin 3 (three) times daily as needed for high blood sugar. Uses sliding scale. Sugar over 200 will give insulin. Patient taking differently: Inject 1-10 Units into the skin See admin instructions. at 0600, 1100, 1600, 2100 per Sliding Scale: 141-170 1 unit 171-200 2 units  201-230 3 units 231-260 4 units 261-290 5 units 291-320 6 units 321-350 7 units 351-380 8 units 381-400 10 units 07/14/18  Yes [name], PA-C  ipratropium-albuterol (DUONEB) 0.5-2.5 (3) MG/3ML SOLN Take 3 mLs by nebulization 4 (four) times daily. Dx: J44.9 03/21/18  Yes Parrett, [name] S, NP  levothyroxine (SYNTHROID, LEVOTHROID) 88 MCG tablet TAKE ONE TABLET BY MOUTH DAILY Patient taking differently: Take 88 mcg by mouth every evening.  07/20/18  Yes [name], PA-C  morphine (MS CONTIN) 30 MG 12 hr tablet Take 1 tablet (30 mg total) by mouth every 12 (  twelve) hours. 09/16/18  Yes Darliss Cheney, MD  Multiple Vitamins-Minerals (CENTRUM SILVER PO) Take by mouth.   Yes [provider]  nortriptyline (PAMELOR) 25 MG capsule Take 1 capsule (25 mg total) by mouth at bedtime. 06/21/18  Yes [name], PA-C  nystatin (MYCOSTATIN/NYSTOP) powder APPLY TOPICALLY TWICE DAILY AS NEEDED FOR YEAST INFECTION Patient taking differently: Apply 1 g topically 2 (two) times daily as needed (yeast).  09/27/17  Yes [name], PA-C  ondansetron (ZOFRAN) 4 MG tablet Take 4 mg by mouth 3 (three) times daily before meals. For 3 days 5.5.20 to 5.7.20   Yes [provider]  OXYGEN Inhale 3.5 L into the lungs continuous. O2 3.5L at home.   Yes [provider]  potassium chloride SA (K-DUR) 20 MEQ tablet Take 2 tablets (40 mEq total) by mouth daily. 09/16/18  Yes Pahwani, Einar Grad, MD  pravastatin (PRAVACHOL) 20 MG tablet TAKE ONE TABLET BY MOUTH EVERY DAY Patient taking differently: Take 20 mg by mouth every evening.  05/05/18  Yes [name], PA-C  promethazine (PHENERGAN) 25 MG tablet Take 25 mg by mouth every 6 (six) hours as needed for nausea or vomiting.   Yes [provider]  sodium polystyrene (KAYEXALATE) 15 GM/60ML suspension Take 30 g by mouth See admin instructions. 30g daily for two days. 5.6.20 thru 5.7.20   Yes [provider]  glucose  blood (ACCU-CHEK AVIVA) test strip Check blood sugars 4 times per day for diabetes. Dx:E11.29 07/24/18   [name], PA-C  insulin glargine (LANTUS) 100 UNIT/ML injection Inject 30 Units into the skin daily.    [provider]    Scheduled Meds: . acetaminophen  650 mg Oral Q6H  . chlorhexidine  15 mL Mouth Rinse BID  . diclofenac sodium  2 g Topical QID  . diltiazem  180 mg Oral Daily  . furosemide  80 mg Intravenous BID  . insulin aspart  0-15 Units Subcutaneous TID WC  . insulin aspart  0-5 Units Subcutaneous QHS  . insulin detemir  10 Units Subcutaneous BID  . levothyroxine  88 mcg Oral Q0600  . lidocaine  1 patch Transdermal Q24H  . mouth rinse  15 mL Mouth Rinse q12n4p  . mometasone-formoterol  2 puff Inhalation BID  . nortriptyline  25 mg Oral QHS  . polyethylene glycol  17 g Oral BID  . potassium chloride  40 mEq Oral TID  . pravastatin  20 mg Oral Daily  . Tbo-Filgrastim  480 mcg Subcutaneous q1800   Infusions: . sodium chloride 10 mL/hr at 09/22/18 1400   PRN Meds: sodium chloride, albuterol, bisacodyl, dextromethorphan-guaiFENesin, ipratropium-albuterol, lactulose, LORazepam, metoprolol tartrate, ondansetron (ZOFRAN) IV, oxyCODONE, sodium chloride flush   Allergies as of 09/19/2018 - Review Complete 09/28/2018  Allergen Reaction Noted  . Gabapentin Anaphylaxis 12/06/2013  . Lyrica [pregabalin] Shortness Of Breath 06/18/2015  . Ketorolac tromethamine Hives 12/06/2013  . Lisinopril Cough 12/06/2013    Family History  Problem Relation Age of Onset  . Heart attack Mother 15       Deceased  . Heart disease Mother   . Emphysema Mother   . Alcoholism Mother   . COPD Father 61       Deceased  . Emphysema Father   . Alcoholism Father   . Esophageal varices Father   . Alcoholism Paternal Grandfather   . Diabetes Maternal Grandmother   . Heart disease Maternal Grandmother   . Lung cancer Maternal Grandfather   .  Emphysema Maternal Grandfather   .  Brain cancer Maternal Aunt   . Diabetes Sister   . Heart defect Sister   . Cancer Sister        was told her sister had cancer but beat it and dont know which one   . Heart defect Sister   . Obesity Son   . Breast cancer Maternal Aunt   . Colon cancer Neg Hx   . Esophageal cancer Neg Hx     Social History   Socioeconomic History  . Marital status: Widowed    Spouse name: Not on file  . Number of children: 1  . Years of education: Not on file  . Highest education level: Not on file  Occupational History  . Occupation: Retired  Scientific laboratory technician  . Financial resource strain: Not on file  . Food insecurity:    Worry: Not on file    Inability: Not on file  . Transportation needs:    Medical: Not on file    Non-medical: Not on file  Tobacco Use  . Smoking status: Former Smoker    Packs/day: 2.50    Years: 45.00    Pack years: 112.50    Types: Cigarettes    Last attempt to quit: 07/21/2016    Years since quitting: 2.1  . Smokeless tobacco: Never Used  Substance and Sexual Activity  . Alcohol use: No  . Drug use: No  . Sexual activity: Never  Lifestyle  . Physical activity:    Days per week: Not on file    Minutes per session: Not on file  . Stress: Not on file  Relationships  . Social connections:    Talks on phone: Not on file    Gets together: Not on file    Attends religious service: Not on file    Active member of club or organization: Not on file    Attends meetings of clubs or organizations: Not on file    Relationship status: Not on file  . Intimate partner violence:    Fear of current or ex partner: Not on file    Emotionally abused: Not on file    Physically abused: Not on file    Forced sexual activity: Not on file  Other Topics Concern  . Not on file  Social History Narrative  . Not on file    REVIEW OF SYSTEMS: Constitutional: Weakness, unable to stand, unable to walk since her recent admission. ENT:  No nose bleeds Pulm: Dyspnea improved.  No  cough currently. CV:  No palpitations, no chest pain.  Chronic LE edema.  GU:  No hematuria, no frequency GI: Per HPI. Heme: Generally does not have issues with bleeding or bruising but these develop when she is in the hospital and receiving Transfusions:  None prior to 09/15/18.   Neuro:  No headaches, no peripheral tingling or numbness Psych: Claustrophobia. Derm:  No itching, no rash or sores.  Endocrine:  No sweats or chills.  No polyuria or dysuria Immunization: Reviewed.  Current on her flu shot. Travel:  None   PHYSICAL EXAM: Vital signs in last 24 hours: Vitals:   09/27/18 0744 09/27/18 0944  BP:  117/77  Pulse: 83 90  Resp: (!) 21 17  Temp:  98.4 F (36.9 C)  SpO2: 100% 100%   Wt Readings from Last 3 Encounters:  09/26/18 94.8 kg  09/15/18 126.9 kg  08/15/18 123.4 kg    General: Morbidly obese, chronically and somewhat acutely ill-appearing WF.  Laying comfortably in bed. Head: No facial asymmetry or swelling.  No signs of head trauma. Eyes: No scleral icterus.  No conjunctival pallor. Ears: Not hard of hearing Nose: No discharge or congestion. Mouth: Pink, moist oral mucosa.  Full set of dentures in place, not removed for exam.  Lateral aspects of tongue with nonspecific, superficial white spots. Neck: No JVD, no masses but neck is obese. Lungs: Clear bilaterally in front.  No labored breathing. Heart: RRR.  S1, S2 present.  No MRG. Abdomen: Large/obese.  Not distended.  Tissue firm, especially on the left abdomen..   Rectal: Deferred Musc/Skeltl: No joint redness or swelling. Extremities: Venous stasis dermatitis in the lower legs from the knee down, bilaterally.  1+ pitting edema in feet/legs.  Nonpitting swelling in the arms, greater on the left. Neurologic: Oriented x3.  No tremors.  Moves all 4 limbs, strength not tested.  No gross deficits. Skin: Venous stasis dermatitis in the legs.  Remnants of hematoma across the left upper chest and the left arm.  Tattoos: Small tattoo on right finger, left lateral ankle. Nodes: No cervical adenopathy appreciated. PsychCriss Rosales affect.  Speech fluid.  No agitation.  Intake/Output from previous day: 05/12 0701 - 05/13 0700 In: 1432.5 [P.O.:1382; IV Piggyback:50.5] Out: 4970 [Urine:3950] Intake/Output this shift: No intake/output data recorded.  LAB RESULTS: Recent Labs    09/26/18 0539 09/27/18 0429 09/27/18 0724  WBC 1.8* 5.8 6.4  HGB 7.8* 8.4* 8.5*  HCT 26.6* 29.4* 30.1*  PLT 74* 83* 83*   BMET Lab Results  Component Value Date   NA 139 09/27/2018   NA 136 09/26/2018   NA 136 09/26/2018   K 3.3 (L) 09/27/2018   K 3.0 (L) 09/26/2018   K 2.6 (LL) 09/26/2018   CL 81 (L) 09/27/2018   CL 82 (L) 09/26/2018   CL 75 (L) 09/26/2018   CO2 45 (H) 09/27/2018   CO2 48 (H) 09/26/2018   CO2 44 (H) 09/26/2018   GLUCOSE 100 (H) 09/27/2018   GLUCOSE 152 (H) 09/26/2018   GLUCOSE 104 (H) 09/26/2018   BUN 64 (H) 09/27/2018   BUN 68 (H) 09/26/2018   BUN 73 (H) 09/26/2018   CREATININE 1.19 (H) 09/27/2018   CREATININE 1.22 (H) 09/26/2018   CREATININE 1.17 (H) 09/26/2018   CALCIUM 8.8 (L) 09/27/2018   CALCIUM 8.5 (L) 09/26/2018   CALCIUM 8.6 (L) 09/26/2018   LFT Recent Labs    09/25/18 0821 09/26/18 0539 09/27/18 0429  PROT 5.0* 5.0* 5.4*  ALBUMIN 3.0* 2.9* 2.9*  AST '23 16 17  '$ ALT 34 28 24  ALKPHOS 129* 115 107  BILITOT 0.7 0.7 1.0   PT/INR Lab Results  Component Value Date   INR 1.3 (H) 09/24/2018   INR 1.3 (A) 04/09/2008   Hepatitis Panel No results for input(s): HEPBSAG, HCVAB, HEPAIGM, HEPBIGM in the last 72 hours. C-Diff No components found for: CDIFF Lipase     Component Value Date/Time   LIPASE 5.0 (L) 11/29/2017 1054    Drugs of Abuse     Component Value Date/Time   LABOPIA PPS 12/02/2014 1351   COCAINSCRNUR NEG 12/02/2014 1351   LABBENZ NEG 12/02/2014 1351   AMPHETMU NEG 12/02/2014 1351   THCU NEG 12/02/2014 1351   LABBARB NEG 12/02/2014 1351      RADIOLOGY STUDIES: Ct Abdomen Pelvis Wo Contrast  Result Date: 09/26/2018 CLINICAL DATA:  64 year old female with pancytopenia. Splenomegaly and cirrhosis. EXAM: CT ABDOMEN AND PELVIS WITHOUT CONTRAST TECHNIQUE: Multidetector CT imaging of  the abdomen and pelvis was performed following the standard protocol without IV contrast. COMPARISON:  CT of the abdomen pelvis dated 08/23/2014 and ultrasound of the abdomen dated 09/04/2018 FINDINGS: Evaluation of this exam is limited in the absence of intravenous contrast as well as due to anasarca. Lower chest: Partially visualized moderate-sized bilateral pleural effusions with associated compressive atelectasis of the lower lobes. Superimposed pneumonia is not excluded. There is mild cardiomegaly. Coronary vascular calcifications noted. No intra-abdominal free air. There is a small ascites. Hepatobiliary: Morphologic changes of cirrhosis. Cholecystectomy. Pancreas: There is a 3.5 x 4.7 cm mass in the region of the head of the pancreas. There is atrophy of the body and tail of the pancreas. This mass may represent a primary pancreatic malignancy or metastatic disease/adenopathy. Spleen: Splenomegaly measuring up to 21 cm in greatest length. Adrenals/Urinary Tract: There is a solitary right kidney. There is a lobulated mass involving the inferior pole of the right kidney measuring approximately 8 x 11 cm in greatest axial dimensions and 14 cm in craniocaudal length most consistent with a neoplasm. There is no hydronephrosis or nephrolithiasis. The urinary bladder is decompressed around a Foley catheter and contains a small amount of air. Stomach/Bowel: There is a large amount of stool within the colon. There is a left spigelian hernia containing a segment of the colon. No associated obstruction. The appendix is not identified with certainty. Vascular/Lymphatic: There is moderate aortoiliac atherosclerotic disease. There is dilatation of the right renal vein with apparent  high attenuating content likely representing tumor thrombus. Further evaluation with MRI or CT with IV contrast recommended. No portal venous gas. There is no retroperitoneal adenopathy. Reproductive: Hysterectomy. There is a 2.7 x 2.1 cm ill-defined high attenuating structure in the region of the right adnexa (series 3, image 61). Other: Diffuse subcutaneous edema and anasarca. Musculoskeletal: Degenerative changes of the spine. No acute osseous pathology. IMPRESSION: 1. Large right renal inferior pole mass most consistent with malignancy. There is suggestion of extension of tumor thrombus into the right renal vein and possibly IVC. Further evaluation with MRI or CT with IV contrast recommended. 2. Rounded mass in the region of the head of the pancreas may represent a primary pancreatic neoplasm versus metastatic disease/adenopathy. 3. Cirrhosis with evidence of portal hypertension, splenomegaly, ascites and anasarca. 4. Partially visualized moderate-sized bilateral pleural effusions with associated compressive atelectasis of the lower lobes. Pneumonia is not excluded. Electronically Signed   By: Anner Crete M.D.   On: 09/26/2018 01:53     IMPRESSION:   *    Mass at the head of the pancreas. LFTs normal.  *    R renal mass.  *      Cirrhosis of the liver.   Ascites, anasarca. Hx hepatitis C treated with Solvadi/ribavirin.  Achieved SVR.  *     Chronic splenomegaly and thrombocytopenia.   *      Pancytopenia.  On Granix per Dr Marin Olp.  1 U PRBC on 5/1, 1 U PRBC on 5/10.  *      Hypokalemia.  *      Acute on chronic hypercarbic respiratory failure.  Cor pulmonale, right heart failure.  Pleural effusions.  *    AKI >> CKD.    *   Claustrophobia.     PLAN:     *    Per Dr Rush Landmark.  ? EUS with FNA.  Would need intubation/general anesthesia for this.  Her sedation risk is high.    *   Await  report of chest CT performed this morning.  Dr Marin Olp order MRI abdomen though pt may not  tolerate.     Azucena Freed  09/27/2018, 9:49 AM Phone [phone]

## 2018-09-26 NOTE — Progress Notes (Addendum)
Physical Therapy Treatment Patient Details Name: Renee Noble MRN: [id number] DOB: March 28, 1956 Today's Date: 09/26/2018    History of Present Illness 63 year female with extensive history significant for but not limited to chronic hypoxic respiratory failure on 3 L oxygen at home, COPD, OHS, OSA, chronic diastolic CHF, PAF - not on AC given chronic thrombocytopenia/ recent anemia, diabetes type 2, and obesity presenting from rehab/ [facility] for altered mental status and hyperkalemia.  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     PT Comments    Pt agreeable to bed level activity. Pt reports new terminal CA diagnosis. Recommend palliative medicine consult.   Follow Up Recommendations  SNF     Equipment Recommendations  None recommended by PT    Recommendations for Other Services Other (comment)(Palliative consult)     Precautions / Restrictions Precautions Precautions: Fall Precaution Comments: New CA diagnosis 5/12 Restrictions Weight Bearing Restrictions: No    Mobility  Bed Mobility Overal bed mobility: Needs Assistance             General bed mobility comments: +2 total to slide up in bed and reposition  Transfers                 General transfer comment: Pt requesting bedlevel today.  Ambulation/Gait                 Stairs             Wheelchair Mobility    Modified Rankin (Stroke Patients Only)       Balance                                            Cognition Arousal/Alertness: Awake/alert Behavior During Therapy: WFL for tasks assessed/performed Overall Cognitive Status: Within Functional Limits for tasks assessed                                 General Comments: Agreeable to bed level activity.      Exercises General Exercises - Lower Extremity Ankle Circles/Pumps: AAROM;Both;10 reps;Supine Short Arc Quad:  PROM;Both;10 reps;Supine Hip ABduction/ADduction: AAROM;Both;10 reps;Supine Other Exercises Other Exercises: Passive internal/external rotation for pain relief    General Comments General comments (skin integrity, edema, etc.): VSS throughout      Pertinent Vitals/Pain Pain Assessment: Faces Faces Pain Scale: Hurts even more Pain Location: back and neck Pain Descriptors / Indicators: Constant Pain Intervention(s): Monitored during session;Repositioned    Home Living                      Prior Function            PT Goals (current goals can now be found in the care plan section) Acute Rehab PT Goals Patient Stated Goal: "Go to respite care Progress towards PT goals: Not progressing toward goals - comment    Frequency    Min 1X/week      PT Plan Current plan remains appropriate;Frequency needs to be updated    Co-evaluation   Reason for Co-Treatment: Other (comment)([place] with OT for sliding up in bed)          AM-PAC PT "6 Clicks" Mobility   Outcome Measure  Help needed turning from your  back to your side while in a flat bed without using bedrails?: Total Help needed moving from lying on your back to sitting on the side of a flat bed without using bedrails?: Total Help needed moving to and from a bed to a chair (including a wheelchair)?: Total Help needed standing up from a chair using your arms (e.g., wheelchair or bedside chair)?: Total Help needed to walk in hospital room?: Total Help needed climbing 3-5 steps with a railing? : Total 6 Click Score: 6    End of Session Equipment Utilized During Treatment: Oxygen Activity Tolerance: Patient limited by fatigue Patient left: in bed;with call bell/phone within reach   PT Visit Diagnosis: Other abnormalities of gait and mobility (R26.89)     Time: 6333-5456 PT Time Calculation (min) (ACUTE ONLY): 11 min  Charges:  $Therapeutic Exercise: 8-22 mins                     [place] Pager [phone] Office [place] 09/26/2018, 11:18 AM

## 2018-09-26 NOTE — Progress Notes (Addendum)
Renee NOTE    Renee Noble  NKN:[id number] DOB: Nov 03, 1955 DOA: 10/09/2018 PCP: [name], PA-C   Brief Narrative:  63 year female with extensive history significant for but not limited to chronic hypoxic respiratory failure on 3 L oxygen at home, COPD, OHS, OSA, chronic diastolic CHF, PAF - not on AC given chronic thrombocytopenia/ recent anemia, diabetes type 2, and obesity presenting from rehab/ [facility] for altered mental status and hyperkalemia.   Recent hospitalization 4/13- 5/2 for acute on chronic respiratory failure requiring intubation twice and acute on chronic heart failure with noncompliance in wearing CPAP and fluid restriction; was COVID negative twice.  She was admitted for acute on chronic hypoxic and hypercapnic respiratory failure as well as hyperkalemia.  She was initially admitted to the ICU on bipap, does not appear she was ever intubated.   Assessment & Plan:   Active Problems:   Acute on chronic respiratory failure with hypoxia and hypercapnia (HCC)   Respiratory failure with hypercapnia (HCC)   Hyperkalemia   Acute on chronic hypoxic/hypercapnic respiratory failure. Severe COPD. OSA/OHS. - dulera, prn abluterol/duoneb - oxygen to keep SpO2 88 to 95% - Bipap qhs and prn - caution with opiates and sedating medications  HFpEF Exacerbation: Echo with normal EF. RV with severely reduced systolic function. (see report)  BNP ~1090.4 CXR on 5/6 with evidence of HF Lasix 80 IV BID now, needs continued diuresis.  Nephrology has now signed off. I/O, daily weights -> she notes her baseline weight is ~273.  Weight 5/11 charted at 299.61 lbs.  Bed weights have been inaccurate. Repeat CXR tomorrow CT scan notable for bilateral pleural effusions with compressive atelectasis.  Suspect this is 2/2 overload.  Continue diuresis, lower suspicion for pneumonia.  Pt afebrile.   Large Right Renal Inferior Pole Mass   Rounded Mass in the region of the  head of the pancreas:  Renal inferior pole mass concerning for malignancy with suggestion of extension of tumor thrombus into R renal vein and possibly IVC Also round mass in region of head of pancreas, possibly primary pancreatic neoplasm vs metastatic disease Appreciate oncology recommendations Will c/s GI to eval for possible EUS/biopsy? -> planning to see her tomorrow, recommending CT abdomen with contrast pancreas protocol (will plan for this tomorrow) Will consult IR for possible percutaneous biopsy Pt did not tolerate MRI today, consider attempt to repeat tomorrow  Pancytopenia: ANC 1.4 today, started on granix per oncology - suspect 2/2 splenomegaly per oncology  Permanent atrial fibrillation. Not on anticoagulation due to chronic thrombocytopenia Continue to follow Cardizem  Hyperkalemia:  Improved Appreciate nephrology Continue lasix Cortisol 20  Hypokalemia: severe, replace, follow  Acute Kidney Injury:  Baseline creatinine is <1 Improving with diuresis (peaked at 1.67) Renal following, appreciate recs - now signed off  Hx of HLD. -  pravachol  RUL pulmonary nodule. - f/u CT chest in July 2020 as outpt  DM type II. - SSI with levemir  Hypothyroidism: - synthroid  Anemia, thrombocytopenia of chronic disease and iron deficiency. - f/u CBC, stable - s/p IV iron per renal - s/p 1 unit pRBC - appropriate bump in Hb, pt stable  Leukopenia: WBC count 1.5 today, downtrending.  ANC yesterday 1.6.  Follow diff.  Continue to monitor.  Consider discussion with heme.  Acute metabolic encephalopathy - improved  Hx of depression, chronic pain - scheduled APAP, voltaren, lidocaine patch - resume nortriptyline   - hold MS contin for now, restart oxycodone (of note, she  told me she was taking oxy 10 mg for breakthrough pain, but I don't see this on PMP aware - ? If this was from facility?) - pain seems ok right now without any long acting meds  Deconditioning. -  PT/OT - recommending SNF  Elevated LFT's Follow  1/2 Blood Cx with coag neg staph, suspect contaminant, follow  DVT prophylaxis: SCD Code Status: full  Family Communication: none at bedside Disposition Plan: pending improvement - needs continued IV diuresis and work up of malignancy    Consultants:   PCCM  Nephrology  Procedures:  IMPRESSIONS    1. The left ventricle has low normal systolic function, with an ejection fraction of 50-55%. The cavity size was normal. There is mildly increased left ventricular wall thickness. Left ventricular diastolic Doppler parameters are consistent with  indeterminate diastolic dysfunction.  2. The right ventricle has severely reduced systolic function. The cavity was severely enlarged. There is no increase in right ventricular wall thickness.  3. Left atrial size was moderately dilated.  4. Right atrial size was moderately dilated.  5. Mildly calcified tricuspid valve leaflets.  6. Moderate thickening of the mitral valve leaflet. Moderate calcification of the mitral valve leaflet. There is moderate mitral annular calcification present.  7. The aortic valve was not well visualized Moderate thickening of the aortic valve Moderate calcification of the aortic valve. Aortic valve regurgitation is trivial by color flow Doppler. Mild-moderate stenosis of the aortic valve.  8. The inferior vena cava was dilated in size with <50% respiratory variability.  Antimicrobials:  Anti-infectives (From admission, onward)   None      Subjective: Anxious.  Feels sob 2/2 anxiety. Asking about cancer.  Objective: Vitals:   09/26/18 0741 09/26/18 0826 09/26/18 1012 09/26/18 1202  BP: 134/62   (!) 117/55  Pulse: 77   99  Resp: 14   17  Temp: 98.6 F (37 C)   98.4 F (36.9 C)  TempSrc: Oral   Oral  SpO2: 95% 95% 97% 97%  Weight:      Height:        Intake/Output Summary (Last 24 hours) at 09/26/2018 1602 Last data filed at 09/26/2018 1508 Gross  per 24 hour  Intake 1640 ml  Output 4700 ml  Net -3060 ml   Filed Weights   09/25/18 0258 09/25/18 1312 09/26/18 0300  Weight: 94.6 kg 135.9 kg 94.8 kg    Examination:  General: No acute distress. Cardiovascular: Heart sounds show Angelica Wix regular rate, and rhythm.  Lungs: Clear to auscultation bilaterally Abdomen: Soft, nontender, nondistended  Neurological: Alert and oriented 3. Moves all extremities 4. Cranial nerves II through XII grossly intact. Skin: Warm and dry. No rashes or lesions. Extremities: No clubbing or cyanosis. Bilateral LE edema. Psychiatric: Mood and affect are normal. Insight and judgment are appropriate.   Data Reviewed: I have personally reviewed following labs and imaging studies  CBC: Recent Labs  Lab 09/19/2018 2157  09/23/18 0545 09/24/18 0445 09/24/18 1559 09/24/18 2227 09/25/18 0821 09/25/18 1302 09/26/18 0539  WBC 6.2   < > 3.4* 2.0*  --   --  1.5* 1.6* 1.8*  NEUTROABS 5.4  --   --  1.6*  --   --   --  1.2* 1.4*  HGB 8.1*   < > 7.3* 7.0* 6.6* 7.5* 7.8* 7.7* 7.8*  HCT 30.5*   < > 26.1* 24.8* 22.8* 25.7* 26.1* 26.5* 26.6*  MCV 91.6   < > 87.0 86.4  --   --  84.7 84.4 85.5  PLT 92*   < > 79* 74*  --   --  PLATELET CLUMPS NOTED ON SMEAR, UNABLE TO ESTIMATE 71* 74*   < > = values in this interval not displayed.   Basic Metabolic Panel: Recent Labs  Lab 09/22/18 0336  09/22/18 1728 09/23/18 0545 09/24/18 0445 09/25/18 0821 09/26/18 0539  NA 138  --  135 136 135 135 136  K 5.4*   < > 5.2* 4.8 4.3 2.8* 2.6*  CL 83*  --  83* 82* 83* 80* 75*  CO2 42*  --  42* 43* 44* 42* 44*  GLUCOSE 187*  --  132* 114* 124* 95 104*  BUN 78*  --  81* 83* 85* 79* 73*  CREATININE 1.51*  --  1.55* 1.56* 1.44* 1.30* 1.17*  CALCIUM 8.9  --  8.7* 8.7* 8.3* 8.3* 8.6*  MG 2.0  --   --   --  2.0  --  1.6*  PHOS 5.7*  --   --   --   --   --   --    < > = values in this interval not displayed.   GFR: Estimated Creatinine Clearance: 53.5 mL/min (Silver Parkey) (by C-G formula  based on SCr of 1.17 mg/dL (H)). Liver Function Tests: Recent Labs  Lab 10/10/2018 2157 09/22/18 0336 09/24/18 0445 09/25/18 0821 09/26/18 0539  AST 38 62* 26 23 16   ALT 59* 84* 48* 34 28  ALKPHOS 161* 181* 143* 129* 115  BILITOT 0.7 0.6 0.7 0.7 0.7  PROT 5.9* 5.5* 5.4* 5.0* 5.0*  ALBUMIN 3.2* 3.0* 3.0* 3.0* 2.9*   No results for input(s): LIPASE, AMYLASE in the last 168 hours. No results for input(s): AMMONIA in the last 168 hours. Coagulation Profile: Recent Labs  Lab 10/01/2018 2157  INR 1.3*   Cardiac Enzymes: Recent Labs  Lab 09/30/2018 2157 09/21/18 0736 09/21/18 2159  CKTOTAL  --   --  9*  TROPONINI 0.03* 0.03*  --    BNP (last 3 results) No results for input(s): PROBNP in the last 8760 hours. HbA1C: No results for input(s): HGBA1C in the last 72 hours. CBG: Recent Labs  Lab 09/25/18 1155 09/25/18 1701 09/25/18 2134 09/26/18 0745 09/26/18 1150  GLUCAP 103* 140* 135* 145* 147*   Lipid Profile: No results for input(s): CHOL, HDL, LDLCALC, TRIG, CHOLHDL, LDLDIRECT in the last 72 hours. Thyroid Function Tests: No results for input(s): TSH, T4TOTAL, FREET4, T3FREE, THYROIDAB in the last 72 hours. Anemia Panel: No results for input(s): VITAMINB12, FOLATE, FERRITIN, TIBC, IRON, RETICCTPCT in the last 72 hours. Sepsis Labs: Recent Labs  Lab 09/27/2018 2157  LATICACIDVEN 1.5    Recent Results (from the past 240 hour(s))  Culture, blood (routine x 2)     Status: None   Collection Time: 10/11/2018  9:50 PM  Result Value Ref Range Status   Specimen Description BLOOD LEFT HAND  Final   Special Requests   Final    BOTTLES DRAWN AEROBIC AND ANAEROBIC Blood Culture adequate volume   Culture   Final    NO GROWTH 5 DAYS Performed at [facility], [address], [place], [place] [address]    Report Status 09/25/2018 FINAL  Final  SARS Coronavirus 2 (CEPHEID- Performed in [facility] hospital lab), Hosp Order     Status: None   Collection Time: 09/21/2018  9:54  PM  Result Value Ref Range Status   SARS Coronavirus 2 NEGATIVE NEGATIVE Final    Comment: (  NOTE) If result is NEGATIVE SARS-CoV-2 target nucleic acids are NOT DETECTED. The SARS-CoV-2 RNA is generally detectable in upper and lower  respiratory specimens during the acute phase of infection. The lowest  concentration of SARS-CoV-2 viral copies this assay can detect is 250  copies / mL. Franceska Strahm negative result does not preclude SARS-CoV-2 infection  and should not be used as the sole basis for treatment or other  patient management decisions.  Aryan Sparks negative result may occur with  improper specimen collection / handling, submission of specimen other  than nasopharyngeal swab, presence of viral mutation(s) within the  areas targeted by this assay, and inadequate number of viral copies  (<250 copies / mL). Darlisha Kelm negative result must be combined with clinical  observations, patient history, and epidemiological information. If result is POSITIVE SARS-CoV-2 target nucleic acids are DETECTED. The SARS-CoV-2 RNA is generally detectable in upper and lower  respiratory specimens dur ing the acute phase of infection.  Positive  results are indicative of active infection with SARS-CoV-2.  Clinical  correlation with patient history and other diagnostic information is  necessary to determine patient infection status.  Positive results do  not rule out bacterial infection or co-infection with other viruses. If result is PRESUMPTIVE POSTIVE SARS-CoV-2 nucleic acids MAY BE PRESENT.   Zyquan Crotty presumptive positive result was obtained on the submitted specimen  and confirmed on repeat testing.  While 2019 novel coronavirus  (SARS-CoV-2) nucleic acids may be present in the submitted sample  additional confirmatory testing may be necessary for epidemiological  and / or clinical management purposes  to differentiate between  SARS-CoV-2 and other Sarbecovirus currently known to infect humans.  If clinically indicated  additional testing with an alternate test  methodology [phone]) is advised. The SARS-CoV-2 RNA is generally  detectable in upper and lower respiratory sp ecimens during the acute  phase of infection. The expected result is Negative. Fact Sheet for Patients:  StrictlyIdeas.no Fact Sheet for Healthcare Providers: BankingDealers.co.za This test is not yet approved or cleared by the [place] FDA and has been authorized for detection and/or diagnosis of SARS-CoV-2 by FDA under an Emergency Use Authorization (EUA).  This EUA will remain in effect (meaning this test can be used) for the duration of the COVID-19 declaration under Section 564(b)(1) of the Act, 21 U.S.C. section 360bbb-3(b)(1), unless the authorization is terminated or revoked sooner. Performed at [facility], [place] [address] [place], [place] [address]   Culture, blood (routine x 2)     Status: Abnormal   Collection Time: 09/15/2018 10:05 PM  Result Value Ref Range Status   Specimen Description BLOOD LEFT ARM  Final   Special Requests   Final    BOTTLES DRAWN AEROBIC AND ANAEROBIC Blood Culture results may not be optimal due to an excessive volume of blood received in culture bottles   Culture  Setup Time   Final    AEROBIC BOTTLE ONLY GRAM POSITIVE COCCI CRITICAL RESULT CALLED TO, READ BACK BY AND VERIFIED WITH: PHARMD J LEDFORD [id number] AT 52 AM BY CM    Culture (Rossy Virag)  Final    STAPHYLOCOCCUS SPECIES (COAGULASE NEGATIVE) THE SIGNIFICANCE OF ISOLATING THIS ORGANISM FROM Yola Paradiso SINGLE SET OF BLOOD CULTURES WHEN MULTIPLE SETS ARE DRAWN IS UNCERTAIN. PLEASE NOTIFY THE MICROBIOLOGY DEPARTMENT WITHIN ONE WEEK IF SPECIATION AND SENSITIVITIES ARE REQUIRED. Performed at [facility], [place] [address], [place], [place] [address]    Report Status 09/23/2018 FINAL  Final  Blood Culture ID Panel (  Reflexed)     Status: Abnormal   Collection Time: 09/24/2018 10:05 PM  Result Value Ref  Range Status   Enterococcus species NOT DETECTED NOT DETECTED Final   Listeria monocytogenes NOT DETECTED NOT DETECTED Final   Staphylococcus species DETECTED (Adiba Fargnoli) NOT DETECTED Final    Comment: Methicillin (oxacillin) resistant coagulase negative staphylococcus. Possible blood culture contaminant (unless isolated from more than one blood culture draw or clinical case suggests pathogenicity). No antibiotic treatment is indicated for blood  culture contaminants. CRITICAL RESULT CALLED TO, READ BACK BY AND VERIFIED WITH: PHARMD J LEDFORD [id number] AT 59 AM BY CM    Staphylococcus aureus (BCID) NOT DETECTED NOT DETECTED Final   Methicillin resistance DETECTED (Bryen Hinderman) NOT DETECTED Final    Comment: CRITICAL RESULT CALLED TO, READ BACK BY AND VERIFIED WITH: PHARMD J LEDFORD [id number] AT 26 AM BY CM    Streptococcus species NOT DETECTED NOT DETECTED Final   Streptococcus agalactiae NOT DETECTED NOT DETECTED Final   Streptococcus pneumoniae NOT DETECTED NOT DETECTED Final   Streptococcus pyogenes NOT DETECTED NOT DETECTED Final   Acinetobacter baumannii NOT DETECTED NOT DETECTED Final   Enterobacteriaceae species NOT DETECTED NOT DETECTED Final   Enterobacter cloacae complex NOT DETECTED NOT DETECTED Final   Escherichia coli NOT DETECTED NOT DETECTED Final   Klebsiella oxytoca NOT DETECTED NOT DETECTED Final   Klebsiella pneumoniae NOT DETECTED NOT DETECTED Final   Proteus species NOT DETECTED NOT DETECTED Final   Serratia marcescens NOT DETECTED NOT DETECTED Final   Haemophilus influenzae NOT DETECTED NOT DETECTED Final   Neisseria meningitidis NOT DETECTED NOT DETECTED Final   Pseudomonas aeruginosa NOT DETECTED NOT DETECTED Final   Candida albicans NOT DETECTED NOT DETECTED Final   Candida glabrata NOT DETECTED NOT DETECTED Final   Candida krusei NOT DETECTED NOT DETECTED Final   Candida parapsilosis NOT DETECTED NOT DETECTED Final   Candida tropicalis NOT DETECTED NOT DETECTED Final    Comment:  Performed at [facility], [address], [place], [place] [address]  MRSA PCR Screening     Status: None   Collection Time: 09/21/18  2:34 AM  Result Value Ref Range Status   MRSA by PCR NEGATIVE NEGATIVE Final    Comment:        The GeneXpert MRSA Assay (FDA approved for NASAL specimens only), is one component of Masashi Snowdon comprehensive MRSA colonization surveillance program. It is not intended to diagnose MRSA infection nor to guide or monitor treatment for MRSA infections. Performed at [facility], [place] [address], [place], [place] [address]          Radiology Studies: Ct Abdomen Pelvis Wo Contrast  Result Date: 09/26/2018 CLINICAL DATA:  64 year old female with pancytopenia. Splenomegaly and cirrhosis. EXAM: CT ABDOMEN AND PELVIS WITHOUT CONTRAST TECHNIQUE: Multidetector CT imaging of the abdomen and pelvis was performed following the standard protocol without IV contrast. COMPARISON:  CT of the abdomen pelvis dated 08/23/2014 and ultrasound of the abdomen dated 09/04/2018 FINDINGS: Evaluation of this exam is limited in the absence of intravenous contrast as well as due to anasarca. Lower chest: Partially visualized moderate-sized bilateral pleural effusions with associated compressive atelectasis of the lower lobes. Superimposed pneumonia is not excluded. There is mild cardiomegaly. Coronary vascular calcifications noted. No intra-abdominal free air. There is Deloss Amico small ascites. Hepatobiliary: Morphologic changes of cirrhosis. Cholecystectomy. Pancreas: There is Ajanee Buren 3.5 x 4.7 cm mass in the region of the head of the pancreas. There is atrophy of the body and  tail of the pancreas. This mass may represent Lashay Osborne primary pancreatic malignancy or metastatic disease/adenopathy. Spleen: Splenomegaly measuring up to 21 cm in greatest length. Adrenals/Urinary Tract: There is Makenzye Troutman solitary right kidney. There is Skyelynn Rambeau lobulated mass involving the inferior pole of the right kidney measuring approximately 8  x 11 cm in greatest axial dimensions and 14 cm in craniocaudal length most consistent with Keeana Pieratt neoplasm. There is no hydronephrosis or nephrolithiasis. The urinary bladder is decompressed around Amneet Cendejas Foley catheter and contains Kylil Swopes small amount of air. Stomach/Bowel: There is Tynetta Bachmann large amount of stool within the colon. There is Tayvien Kane left spigelian hernia containing Kanyla Omeara segment of the colon. No associated obstruction. The appendix is not identified with certainty. Vascular/Lymphatic: There is moderate aortoiliac atherosclerotic disease. There is dilatation of the right renal vein with apparent high attenuating content likely representing tumor thrombus. Further evaluation with MRI or CT with IV contrast recommended. No portal venous gas. There is no retroperitoneal adenopathy. Reproductive: Hysterectomy. There is Arsalan Brisbin 2.7 x 2.1 cm ill-defined high attenuating structure in the region of the right adnexa (series 3, image 61). Other: Diffuse subcutaneous edema and anasarca. Musculoskeletal: Degenerative changes of the spine. No acute osseous pathology. IMPRESSION: 1. Large right renal inferior pole mass most consistent with malignancy. There is suggestion of extension of tumor thrombus into the right renal vein and possibly IVC. Further evaluation with MRI or CT with IV contrast recommended. 2. Rounded mass in the region of the head of the pancreas may represent Amarilis Belflower primary pancreatic neoplasm versus metastatic disease/adenopathy. 3. Cirrhosis with evidence of portal hypertension, splenomegaly, ascites and anasarca. 4. Partially visualized moderate-sized bilateral pleural effusions with associated compressive atelectasis of the lower lobes. Pneumonia is not excluded. Electronically Signed   By: Anner Crete M.D.   On: 09/26/2018 01:53   Dg Chest 2 View  Result Date: 09/25/2018 CLINICAL DATA:  Heart failure EXAM: CHEST - 2 VIEW COMPARISON:  10/09/2018 FINDINGS: Cardiac enlargement. Pulmonary vascular congestion. Progression of  diffuse bilateral airspace disease with particular prominence in the left upper lobe. Small bilateral effusions. Atherosclerotic aortic arch. IMPRESSION: Progression of congestive heart failure with edema and small effusions. Electronically Signed   By: Franchot Gallo M.D.   On: 09/25/2018 07:33        Scheduled Meds:  acetaminophen  650 mg Oral Q6H   chlorhexidine  15 mL Mouth Rinse BID   diclofenac sodium  2 g Topical QID   diltiazem  180 mg Oral Daily   furosemide  80 mg Intravenous BID   insulin aspart  0-15 Units Subcutaneous TID WC   insulin aspart  0-5 Units Subcutaneous QHS   insulin detemir  10 Units Subcutaneous BID   levothyroxine  88 mcg Oral Q0600   lidocaine  1 patch Transdermal Q24H   mouth rinse  15 mL Mouth Rinse q12n4p   mometasone-formoterol  2 puff Inhalation BID   nortriptyline  25 mg Oral QHS   polyethylene glycol  17 g Oral BID   potassium chloride  40 mEq Oral TID   pravastatin  20 mg Oral Daily   Tbo-Filgrastim  480 mcg Subcutaneous q1800   Continuous Infusions:  sodium chloride 10 mL/hr at 09/22/18 1400     LOS: 6 days    Time spent: over 30 min    Fayrene Helper, MD [facility] Pager AMION  If 7PM-7AM, please contact night-coverage [email or web address] Password [facility] 09/26/2018, 4:02 PM

## 2018-09-26 NOTE — Progress Notes (Signed)
RT Note:  Patient requested not to go on Bipap till about 2 am.  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  Will place her on bipap at that time.

## 2018-09-26 NOTE — Progress Notes (Signed)
Text Sent to Dr.Schorr for CT. Pt order for CT of Abd was entered incorrectly and needs to be changed also pt is on a 1200 ml FR and has met her limit today and she needs to drink 400 ml of water for CT. Number for CT put in text for Dr. Schorr to contact. 

## 2018-09-26 NOTE — Progress Notes (Signed)
Dr.Schorr changed order for CT of Abd but pt refused to go tonight stated she will go tomorrow. CT aware.

## 2018-09-26 NOTE — Progress Notes (Signed)
MRI attempted on patient.  She started yelling that she could not breathe laying flat and did not wish to continue at this time. Pt was on 4L of O2 while in scanner.

## 2018-09-26 NOTE — Progress Notes (Signed)
[facility]    NEPHROLOGY PROGRESS NOTE  INTERVAL EVENTS: Patient resting without complaints.  Hematology saw her yesterday for pancytopenia and imaging has now revealed pancreatic and renal masses.  She has no new complaints today.   OBJECTIVE:  Vitals:   09/26/18 1012 09/26/18 1202  BP:  (!) 117/55  Pulse:  99  Resp:  17  Temp:  98.4 F (36.9 C)  SpO2: 97% 97%    Intake/Output Summary (Last 24 hours) at 09/26/2018 1220 Last data filed at 09/26/2018 1048 Gross per 24 hour  Intake 1300 ml  Output 4350 ml  Net -3050 ml      General:  Sleepy and comfortable in bed. HEENT: MMM [place] AT anicteric sclera Neck:  No JVD, no adenopathy CV:  Heart RRR  Lungs:  Normal WOB at rest Abd:  abd SNT/ND with normal BS, obese, pitting edema to her pannus GU:  Bladder non-palpable Extremities: +1 bilateral lower extremity edema Skin:  No skin rash  MEDICATIONS:  . acetaminophen  650 mg Oral Q6H  . chlorhexidine  15 mL Mouth Rinse BID  . diclofenac sodium  2 g Topical QID  . diltiazem  180 mg Oral Daily  . furosemide  80 mg Intravenous BID  . insulin aspart  0-15 Units Subcutaneous TID WC  . insulin aspart  0-5 Units Subcutaneous QHS  . insulin detemir  10 Units Subcutaneous BID  . levothyroxine  88 mcg Oral Q0600  . lidocaine  1 patch Transdermal Q24H  . mouth rinse  15 mL Mouth Rinse q12n4p  . mometasone-formoterol  2 puff Inhalation BID  . nortriptyline  25 mg Oral QHS  . polyethylene glycol  17 g Oral BID  . potassium chloride  40 mEq Oral TID  . pravastatin  20 mg Oral Daily  . sevelamer carbonate  800 mg Oral TID WC  . Tbo-Filgrastim  480 mcg Subcutaneous q1800       LABS:   CBC Latest Ref Rng & Units 09/26/2018 09/25/2018 09/25/2018  WBC 4.0 - 10.5 K/uL 1.8(L) 1.6(L) 1.5(L)  Hemoglobin 12.0 - 15.0 g/dL 7.8(L) 7.7(L) 7.8(L)  Hematocrit 36.0 - 46.0 % 26.6(L) 26.5(L) 26.1(L)  Platelets 150 - 400 K/uL 74(L) 71(L) PLATELET CLUMPS NOTED ON SMEAR, UNABLE TO  ESTIMATE    CMP Latest Ref Rng & Units 09/26/2018 09/25/2018 09/24/2018  Glucose 70 - 99 mg/dL 104(H) 95 124(H)  BUN 8 - 23 mg/dL 73(H) 79(H) 85(H)  Creatinine 0.44 - 1.00 mg/dL 1.17(H) 1.30(H) 1.44(H)  Sodium 135 - 145 mmol/L 136 135 135  Potassium 3.5 - 5.1 mmol/L 2.6(LL) 2.8(L) 4.3  Chloride 98 - 111 mmol/L 75(L) 80(L) 83(L)  CO2 22 - 32 mmol/L 44(H) 42(H) 44(H)  Calcium 8.9 - 10.3 mg/dL 8.6(L) 8.3(L) 8.3(L)  Total Protein 6.5 - 8.1 g/dL 5.0(L) 5.0(L) 5.4(L)  Total Bilirubin 0.3 - 1.2 mg/dL 0.7 0.7 0.7  Alkaline Phos 38 - 126 U/L 115 129(H) 143(H)  AST 15 - 41 U/L 16 23 26   ALT 0 - 44 U/L 28 34 48(H)    Lab Results  Component Value Date   CALCIUM 8.6 (L) 09/26/2018   CAION 1.09 (L) 10/09/2018   PHOS 5.7 (H) 09/22/2018       Component Value Date/Time   COLORURINE YELLOW 08/29/2018 0950   APPEARANCEUR CLEAR 08/29/2018 0950   LABSPEC 1.014 08/29/2018 0950   PHURINE 5.0 08/29/2018 0950   GLUCOSEU NEGATIVE 08/29/2018 0950   GLUCOSEU NEGATIVE 08/17/2016 1340   HGBUR SMALL (A) 08/29/2018  [place] 08/29/2018 0950   BILIRUBINUR neg 01/15/2015 [place] 08/29/2018 0950   PROTEINUR 30 (A) 08/29/2018 0950   UROBILINOGEN 1.0 08/17/2016 1340   NITRITE NEGATIVE 08/29/2018 0950   LEUKOCYTESUR NEGATIVE 08/29/2018 0950      Component Value Date/Time   PHART 7.301 (L) 09/21/2018 2125   PCO2ART 91.0 (HH) 09/21/2018 2125   PO2ART 101 09/21/2018 2125   HCO3 43.7 (H) 09/21/2018 2125   TCO2 49 (H) 10/06/2018 2205   O2SAT 97.9 09/21/2018 2125       Component Value Date/Time   IRON 16 (L) 09/22/2018 1728   TIBC 328 09/22/2018 1728   FERRITIN 39 09/22/2018 1728   IRONPCTSAT 5 (L) 09/22/2018 1728       ASSESSMENT/PLAN:    63 year old female patient with a past medical history significant for chronic kidney disease stage III with a baseline creatinine of 1.3-1.6, diabetes, COPD, and congestive heart failure who presented with shortness of breath and  acute on chronic hypercarbic respiratory failure.  She had a recent acute kidney injury event with a peak creatinine of 2.82 on 4/16.  She was markedly hyperkalemic with a potassium of 7.2 on admission, and was on potassium supplementation at home.  Her hyperkalemia has resolved with supportive care.  Her creatinine has improved to 1.17 this morning.  She's been diuresing nicely and is ~9L net negative for the admission.    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  On background of significant comorbidties it appears at this point diagnostic evaluation will be pursued but therapeutic options may be quite limited.    Nephrology will sign off though we would be happy to see her again at any time.  I don't think we are adding much to her care at the present moment.    Jannifer Hick MD [facility] Pager [phone]

## 2018-09-27 ENCOUNTER — Inpatient Hospital Stay (HOSPITAL_COMMUNITY): Payer: Medicare Other

## 2018-09-27 DIAGNOSIS — K8689 Other specified diseases of pancreas: Secondary | ICD-10-CM

## 2018-09-27 LAB — GLUCOSE, CAPILLARY
Glucose-Capillary: 89 mg/dL (ref 70–99)
Glucose-Capillary: 122 mg/dL — ABNORMAL HIGH (ref 70–99)
Glucose-Capillary: 99 mg/dL (ref 70–99)
Glucose-Capillary: 130 mg/dL — ABNORMAL HIGH (ref 70–99)

## 2018-09-27 LAB — LACTATE DEHYDROGENASE: LDH: 108 U/L (ref 98–192)

## 2018-09-27 LAB — CBC WITH DIFFERENTIAL/PLATELET
Abs Immature Granulocytes: 0.04 10*3/uL (ref 0.00–0.07)
Abs Immature Granulocytes: 0.04 10*3/uL (ref 0.00–0.07)
Basophils Absolute: 0 10*3/uL (ref 0.0–0.1)
Basophils Absolute: 0 10*3/uL (ref 0.0–0.1)
Basophils Relative: 0 %
Basophils Relative: 0 %
Eosinophils Absolute: 0 10*3/uL (ref 0.0–0.5)
Eosinophils Absolute: 0 10*3/uL (ref 0.0–0.5)
Eosinophils Relative: 0 %
Eosinophils Relative: 0 %
HCT: 29.4 % — ABNORMAL LOW (ref 36.0–46.0)
HCT: 30.1 % — ABNORMAL LOW (ref 36.0–46.0)
Hemoglobin: 8.4 g/dL — ABNORMAL LOW (ref 12.0–15.0)
Hemoglobin: 8.5 g/dL — ABNORMAL LOW (ref 12.0–15.0)
Immature Granulocytes: 1 %
Immature Granulocytes: 1 %
Lymphocytes Relative: 5 %
Lymphocytes Relative: 6 %
Lymphs Abs: 0.3 10*3/uL — ABNORMAL LOW (ref 0.7–4.0)
Lymphs Abs: 0.4 10*3/uL — ABNORMAL LOW (ref 0.7–4.0)
MCH: 24.8 pg — ABNORMAL LOW (ref 26.0–34.0)
MCH: 24.5 pg — ABNORMAL LOW (ref 26.0–34.0)
MCHC: 28.6 g/dL — ABNORMAL LOW (ref 30.0–36.0)
MCHC: 28.2 g/dL — ABNORMAL LOW (ref 30.0–36.0)
MCV: 86.7 fL (ref 80.0–100.0)
MCV: 86.7 fL (ref 80.0–100.0)
Monocytes Absolute: 0.6 10*3/uL (ref 0.1–1.0)
Monocytes Absolute: 0.5 10*3/uL (ref 0.1–1.0)
Monocytes Relative: 10 %
Monocytes Relative: 8 %
Neutro Abs: 4.9 10*3/uL (ref 1.7–7.7)
Neutro Abs: 5.5 10*3/uL (ref 1.7–7.7)
Neutrophils Relative %: 84 %
Neutrophils Relative %: 85 %
Platelets: 83 10*3/uL — ABNORMAL LOW (ref 150–400)
Platelets: 83 10*3/uL — ABNORMAL LOW (ref 150–400)
RBC: 3.39 MIL/uL — ABNORMAL LOW (ref 3.87–5.11)
RBC: 3.47 MIL/uL — ABNORMAL LOW (ref 3.87–5.11)
RDW: 19.8 % — ABNORMAL HIGH (ref 11.5–15.5)
RDW: 19.6 % — ABNORMAL HIGH (ref 11.5–15.5)
WBC: 5.8 10*3/uL (ref 4.0–10.5)
WBC: 6.4 10*3/uL (ref 4.0–10.5)
nRBC: 0.3 % — ABNORMAL HIGH (ref 0.0–0.2)
nRBC: 0 % (ref 0.0–0.2)

## 2018-09-27 LAB — COMPREHENSIVE METABOLIC PANEL
ALT: 24 U/L (ref 0–44)
AST: 17 U/L (ref 15–41)
Albumin: 2.9 g/dL — ABNORMAL LOW (ref 3.5–5.0)
Alkaline Phosphatase: 107 U/L (ref 38–126)
Anion gap: 13 (ref 5–15)
BUN: 64 mg/dL — ABNORMAL HIGH (ref 8–23)
CO2: 45 mmol/L — ABNORMAL HIGH (ref 22–32)
Calcium: 8.8 mg/dL — ABNORMAL LOW (ref 8.9–10.3)
Chloride: 81 mmol/L — ABNORMAL LOW (ref 98–111)
Creatinine, Ser: 1.19 mg/dL — ABNORMAL HIGH (ref 0.44–1.00)
GFR calc Af Amer: 55 mL/min — ABNORMAL LOW (ref >60)
GFR calc non Af Amer: 48 mL/min — ABNORMAL LOW (ref >60)
Glucose, Bld: 100 mg/dL — ABNORMAL HIGH (ref 70–99)
Potassium: 3.3 mmol/L — ABNORMAL LOW (ref 3.5–5.1)
Sodium: 139 mmol/L (ref 135–145)
Total Bilirubin: 1 mg/dL (ref 0.3–1.2)
Total Protein: 5.4 g/dL — ABNORMAL LOW (ref 6.5–8.1)

## 2018-09-27 LAB — PROTIME-INR
INR: 1.1 (ref 0.8–1.2)
Prothrombin Time: 14.4 seconds (ref 11.4–15.2)

## 2018-09-27 LAB — MAGNESIUM: Magnesium: 1.8 mg/dL (ref 1.7–2.4)

## 2018-09-27 LAB — CANCER ANTIGEN 19-9: CA 19-9: 51 U/mL — ABNORMAL HIGH (ref 0–35)

## 2018-09-27 LAB — ERYTHROPOIETIN: Erythropoietin: 157.4 m[IU]/mL — ABNORMAL HIGH (ref 2.6–18.5)

## 2018-09-27 MED ORDER — BISACODYL 5 MG PO TBEC
10.0000 mg | DELAYED_RELEASE_TABLET | Freq: Once | ORAL | Status: AC
  Administered 2018-09-27: 10 mg via ORAL
  Filled 2018-09-27: qty 2

## 2018-09-27 MED ORDER — IOHEXOL 300 MG/ML  SOLN
100.0000 mL | Freq: Once | INTRAMUSCULAR | Status: AC | PRN
  Administered 2018-09-27: 100 mL via INTRAVENOUS

## 2018-09-27 NOTE — Progress Notes (Signed)
Pt is very needy. She calls out frequently for staff to move her legs, adjust her covers (close of her to do herself), and wants you to feed her her meds. I explained if you do this at home you can do it here. Pt frequently asking for something to drink although she knows she is on a FR and about how much she has drank for the day

## 2018-09-27 NOTE — Progress Notes (Signed)
There really is not that much different today as compared to yesterday.  She is not yet had an MRI of the abdomen and pelvis to try to better evaluate the right renal mass and potential tumor thrombus.  She needs to be seen by gastroenterology.  Maybe they will do an upper endoscopic ultrasound and biopsy this pancreatic head mass.  There is no CBC back yet today.  Her renal function continues to improve.  As such, the MRI can be done with IV contrast.  There is no bleeding.  I am not sure how much she is really eating.  She still has a very limited performance status.  At best, her performance status is ECOG 3.  I still am of the opinion that there is very little that we can do to help her out if she has a malignancy.  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  We will have to see what her CA 19-9 is.  We will just have to wait the radiographic studies and possible upper endoscopy.  Again, I am not sure if GI would scope her given her marginal cardiopulmonary status.  I very much appreciate everybody's help with her on 2 W.  Lattie Haw, MD  Darlyn Chamber 33:3

## 2018-09-27 NOTE — Progress Notes (Signed)
[name] Kitchen  PROGRESS NOTE    Renee Noble  GUR:[id number] DOB: 08/20/1955 DOA: 09/26/2018 PCP: [name], PA-C   Brief Narrative:   63 year female with extensive history significant for but not limited tochronic hypoxic respiratory failure on 3 L oxygen at home, COPD, OHS,OSA, chronic diastolic CHF,PAF - not on AC given chronic thrombocytopenia/ recent anemia,diabetes type 2,andobesitypresenting from rehab/ [facility] for altered mental status and hyperkalemia.   Recent hospitalization 4/13- 5/2 for acute on chronic respiratory failure requiring intubation twice and acute on chronic heart failure with noncompliance in wearing CPAP and fluid restriction; was COVID negative twice.  She was admitted for acute on chronic hypoxic and hypercapnic respiratory failure as well as hyperkalemia.  She was initially admitted to the ICU on bipap, does not appear she was ever intubated.    Assessment & Plan:   Active Problems:   Acute on chronic respiratory failure with hypoxia and hypercapnia (HCC)   Respiratory failure with hypercapnia (HCC)   Hyperkalemia   Acute on chronic hypoxic/hypercapnic respiratory failure. Severe COPD. OSA/OHS.     - dulera, prn abluterol/duoneb     - oxygen to keep SpO2 88 to 95%     - Bipap qhs and prn     - caution with opiates and sedating medications  HFpEF Exacerbation:     - Echo with normal EF. RV with severely reduced systolic function. (see report)      - BNP ~1090.4     - CXR on 5/6 with evidence of HF     - Lasix 80 IV BID now, needs continued diuresis.  Nephrology has now signed off.     - I/O, daily weights -> she notes her baseline weight is ~273.  Weight 5/11 charted at 299.61 lbs.  Bed weights have been inaccurate.     - CT scan notable for bilateral pleural effusions with compressive atelectasis; Suspect this is 2/2 overload.       - Continue diuresis, lower suspicion for pneumonia.  Pt afebrile.   Large Right Renal Inferior  Pole Mass   Rounded Mass in the region of the head of the pancreas:      - Renal inferior pole mass concerning for malignancy with suggestion of extension of tumor thrombus into R renal vein and possibly IVC     - Also round mass in region of head of pancreas, possibly primary pancreatic neoplasm vs metastatic disease     - Appreciate oncology recommendations     - GI consulted; poor candidate for scope      - IR consulted for possible percutaneous biopsy; appreciate assistance     - Pt did not tolerate MRI, attempt to repeat?  Pancytopenia     - s/p granix; monitor CBC  Permanent atrial fibrillation.     - Not on anticoagulation due to chronic thrombocytopenia     - Cardizem  Hypokalemia:      - severe, replace, follow  Acute Kidney Injury:      - Baseline creatinine is <1     - Improving with diuresis (peaked at 1.67)     - Renal following, appreciate recs - now signed off  Hx of HLD     - pravachol  RUL pulmonary nodule.     - f/u CT chest in July 2020 as outpt  DM type II.     - SSI with levemir  Hypothyroidism:     - synthroid  Anemia, thrombocytopenia of chronic disease  and iron deficiency.     - f/u CBC, stable     - s/p IV iron per renal     - s/p 1 unit pRBC     - appropriate bump in Hb, pt stable  Leukopenia     - resolves, s/p granix  Acute metabolic encephalopathy     - resolved  Hx of depression, chronic pain     - scheduled APAP, voltaren, lidocaine patch, nortriptyline       - PRN oxycodone  Deconditioning.     - PT/OT     - recommending SNF   DVT prophylaxis: SCDs Code Status: FULL Family Communication: None   Disposition Plan: TBD   Consultants:   PCCM  Nephrology  GI  IR  Procedures:   None   Subjective: "I don't like small spaces"  Objective: Vitals:   09/27/18 0744 09/27/18 0944 09/27/18 1310 09/27/18 1609  BP:  117/77 (!) 105/50 (!) 107/54  Pulse: 83 90 83 83  Resp: (!) 21 17 12 20   Temp:  98.4 F  (36.9 C) 98.2 F (36.8 C) 97.7 F (36.5 C)  TempSrc:  Oral Oral Oral  SpO2: 100% 100% 91% 95%  Weight:      Height:        Intake/Output Summary (Last 24 hours) at 09/27/2018 1623 Last data filed at 09/27/2018 1300 Gross per 24 hour  Intake 1196.51 ml  Output 2700 ml  Net -1503.49 ml   Filed Weights   09/25/18 0258 09/25/18 1312 09/26/18 0300  Weight: 94.6 kg 135.9 kg 94.8 kg    Examination:  .General: 63 y.o. female resting in bed in NAD Cardiovascular: RRR, +S1, S2, no m/g/r, equal pulses throughout Respiratory: decreased at bases, upper airway transmission, shallow breathing GI: BS+, NDNT, no masses noted, no organomegaly noted MSK: No e/c/c Skin: No rashes, bruises, ulcerations noted Neuro: A&O x 3, no focal deficits     Data Reviewed: I have personally reviewed following labs and imaging studies.  CBC: Recent Labs  Lab 09/24/18 0445  09/25/18 0821 09/25/18 1302 09/26/18 0539 09/27/18 0429 09/27/18 0724  WBC 2.0*  --  1.5* 1.6* 1.8* 5.8 6.4  NEUTROABS 1.6*  --   --  1.2* 1.4* 4.9 5.5  HGB 7.0*   < > 7.8* 7.7* 7.8* 8.4* 8.5*  HCT 24.8*   < > 26.1* 26.5* 26.6* 29.4* 30.1*  MCV 86.4  --  84.7 84.4 85.5 86.7 86.7  PLT 74*  --  PLATELET CLUMPS NOTED ON SMEAR, UNABLE TO ESTIMATE 71* 74* 83* 83*   < > = values in this interval not displayed.   Basic Metabolic Panel: Recent Labs  Lab 09/22/18 0336  09/24/18 0445 09/25/18 0821 09/26/18 0539 09/26/18 1603 09/27/18 0429  NA 138   < > 135 135 136 136 139  K 5.4*   < > 4.3 2.8* 2.6* 3.0* 3.3*  CL 83*   < > 83* 80* 75* 82* 81*  CO2 42*   < > 44* 42* 44* 48* 45*  GLUCOSE 187*   < > 124* 95 104* 152* 100*  BUN 78*   < > 85* 79* 73* 68* 64*  CREATININE 1.51*   < > 1.44* 1.30* 1.17* 1.22* 1.19*  CALCIUM 8.9   < > 8.3* 8.3* 8.6* 8.5* 8.8*  MG 2.0  --  2.0  --  1.6*  --  1.8  PHOS 5.7*  --   --   --   --   --   --    < > =  values in this interval not displayed.   GFR: Estimated Creatinine Clearance: 52.6  mL/min (A) (by C-G formula based on SCr of 1.19 mg/dL (H)). Liver Function Tests: Recent Labs  Lab 09/22/18 0336 09/24/18 0445 09/25/18 0821 09/26/18 0539 09/27/18 0429  AST 62* 26 23 16 17   ALT 84* 48* 34 28 24  ALKPHOS 181* 143* 129* 115 107  BILITOT 0.6 0.7 0.7 0.7 1.0  PROT 5.5* 5.4* 5.0* 5.0* 5.4*  ALBUMIN 3.0* 3.0* 3.0* 2.9* 2.9*   No results for input(s): LIPASE, AMYLASE in the last 168 hours. No results for input(s): AMMONIA in the last 168 hours. Coagulation Profile: Recent Labs  Lab 10/08/2018 2157  INR 1.3*   Cardiac Enzymes: Recent Labs  Lab 09/30/2018 2157 09/21/18 0736 09/21/18 2159  CKTOTAL  --   --  9*  TROPONINI 0.03* 0.03*  --    BNP (last 3 results) No results for input(s): PROBNP in the last 8760 hours. HbA1C: No results for input(s): HGBA1C in the last 72 hours. CBG: Recent Labs  Lab 09/26/18 1639 09/26/18 2109 09/27/18 0940 09/27/18 1241 09/27/18 1613  GLUCAP 145* 134* 89 122* 99   Lipid Profile: No results for input(s): CHOL, HDL, LDLCALC, TRIG, CHOLHDL, LDLDIRECT in the last 72 hours. Thyroid Function Tests: No results for input(s): TSH, T4TOTAL, FREET4, T3FREE, THYROIDAB in the last 72 hours. Anemia Panel: No results for input(s): VITAMINB12, FOLATE, FERRITIN, TIBC, IRON, RETICCTPCT in the last 72 hours. Sepsis Labs: Recent Labs  Lab 10/08/2018 2157  LATICACIDVEN 1.5    Recent Results (from the past 240 hour(s))  Culture, blood (routine x 2)     Status: None   Collection Time: 09/23/2018  9:50 PM  Result Value Ref Range Status   Specimen Description BLOOD LEFT HAND  Final   Special Requests   Final    BOTTLES DRAWN AEROBIC AND ANAEROBIC Blood Culture adequate volume   Culture   Final    NO GROWTH 5 DAYS Performed at [facility], [address], [place], [place] [address]    Report Status 09/25/2018 FINAL  Final  SARS Coronavirus 2 (CEPHEID- Performed in [facility] lab), Hosp Order     Status: None   Collection  Time: 09/29/2018  9:54 PM  Result Value Ref Range Status   SARS Coronavirus 2 NEGATIVE NEGATIVE Final    Comment: (NOTE) If result is NEGATIVE SARS-CoV-2 target nucleic acids are NOT DETECTED. The SARS-CoV-2 RNA is generally detectable in upper and lower  respiratory specimens during the acute phase of infection. The lowest  concentration of SARS-CoV-2 viral copies this assay can detect is 250  copies / mL. A negative result does not preclude SARS-CoV-2 infection  and should not be used as the sole basis for treatment or other  patient management decisions.  A negative result may occur with  improper specimen collection / handling, submission of specimen other  than nasopharyngeal swab, presence of viral mutation(s) within the  areas targeted by this assay, and inadequate number of viral copies  (<250 copies / mL). A negative result must be combined with clinical  observations, patient history, and epidemiological information. If result is POSITIVE SARS-CoV-2 target nucleic acids are DETECTED. The SARS-CoV-2 RNA is generally detectable in upper and lower  respiratory specimens dur ing the acute phase of infection.  Positive  results are indicative of active infection with SARS-CoV-2.  Clinical  correlation with patient history and other diagnostic information is  necessary to determine patient infection  status.  Positive results do  not rule out bacterial infection or co-infection with other viruses. If result is PRESUMPTIVE POSTIVE SARS-CoV-2 nucleic acids MAY BE PRESENT.   A presumptive positive result was obtained on the submitted specimen  and confirmed on repeat testing.  While 2019 novel coronavirus  (SARS-CoV-2) nucleic acids may be present in the submitted sample  additional confirmatory testing may be necessary for epidemiological  and / or clinical management purposes  to differentiate between  SARS-CoV-2 and other Sarbecovirus currently known to infect humans.  If  clinically indicated additional testing with an alternate test  methodology [phone]) is advised. The SARS-CoV-2 RNA is generally  detectable in upper and lower respiratory sp ecimens during the acute  phase of infection. The expected result is Negative. Fact Sheet for Patients:  StrictlyIdeas.no Fact Sheet for Healthcare Providers: BankingDealers.co.za This test is not yet approved or cleared by the [place] FDA and has been authorized for detection and/or diagnosis of SARS-CoV-2 by FDA under an Emergency Use Authorization (EUA).  This EUA will remain in effect (meaning this test can be used) for the duration of the COVID-19 declaration under Section 564(b)(1) of the Act, 21 U.S.C. section 360bbb-3(b)(1), unless the authorization is terminated or revoked sooner. Performed at [facility], [place] [address] [place], [place] [address]   Culture, blood (routine x 2)     Status: Abnormal   Collection Time: 09/17/2018 10:05 PM  Result Value Ref Range Status   Specimen Description BLOOD LEFT ARM  Final   Special Requests   Final    BOTTLES DRAWN AEROBIC AND ANAEROBIC Blood Culture results may not be optimal due to an excessive volume of blood received in culture bottles   Culture  Setup Time   Final    AEROBIC BOTTLE ONLY GRAM POSITIVE COCCI CRITICAL RESULT CALLED TO, READ BACK BY AND VERIFIED WITH: PHARMD J LEDFORD [id number] AT 57 AM BY CM    Culture (A)  Final    STAPHYLOCOCCUS SPECIES (COAGULASE NEGATIVE) THE SIGNIFICANCE OF ISOLATING THIS ORGANISM FROM A SINGLE SET OF BLOOD CULTURES WHEN MULTIPLE SETS ARE DRAWN IS UNCERTAIN. PLEASE NOTIFY THE MICROBIOLOGY DEPARTMENT WITHIN ONE WEEK IF SPECIATION AND SENSITIVITIES ARE REQUIRED. Performed at [facility], [place] [address] [place], [place] [address]    Report Status 09/23/2018 FINAL  Final  Blood Culture ID Panel (Reflexed)     Status: Abnormal   Collection Time: 09/23/2018 10:05  PM  Result Value Ref Range Status   Enterococcus species NOT DETECTED NOT DETECTED Final   Listeria monocytogenes NOT DETECTED NOT DETECTED Final   Staphylococcus species DETECTED (A) NOT DETECTED Final    Comment: Methicillin (oxacillin) resistant coagulase negative staphylococcus. Possible blood culture contaminant (unless isolated from more than one blood culture draw or clinical case suggests pathogenicity). No antibiotic treatment is indicated for blood  culture contaminants. CRITICAL RESULT CALLED TO, READ BACK BY AND VERIFIED WITH: PHARMD J LEDFORD [id number] AT 97 AM BY CM    Staphylococcus aureus (BCID) NOT DETECTED NOT DETECTED Final   Methicillin resistance DETECTED (A) NOT DETECTED Final    Comment: CRITICAL RESULT CALLED TO, READ BACK BY AND VERIFIED WITH: PHARMD J LEDFORD [id number] AT 14 AM BY CM    Streptococcus species NOT DETECTED NOT DETECTED Final   Streptococcus agalactiae NOT DETECTED NOT DETECTED Final   Streptococcus pneumoniae NOT DETECTED NOT DETECTED Final   Streptococcus pyogenes NOT DETECTED NOT DETECTED Final   Acinetobacter baumannii NOT DETECTED NOT DETECTED  Final   Enterobacteriaceae species NOT DETECTED NOT DETECTED Final   Enterobacter cloacae complex NOT DETECTED NOT DETECTED Final   Escherichia coli NOT DETECTED NOT DETECTED Final   Klebsiella oxytoca NOT DETECTED NOT DETECTED Final   Klebsiella pneumoniae NOT DETECTED NOT DETECTED Final   Proteus species NOT DETECTED NOT DETECTED Final   Serratia marcescens NOT DETECTED NOT DETECTED Final   Haemophilus influenzae NOT DETECTED NOT DETECTED Final   Neisseria meningitidis NOT DETECTED NOT DETECTED Final   Pseudomonas aeruginosa NOT DETECTED NOT DETECTED Final   Candida albicans NOT DETECTED NOT DETECTED Final   Candida glabrata NOT DETECTED NOT DETECTED Final   Candida krusei NOT DETECTED NOT DETECTED Final   Candida parapsilosis NOT DETECTED NOT DETECTED Final   Candida tropicalis NOT DETECTED NOT  DETECTED Final    Comment: Performed at [facility], [place] [address], [place], [place] [address]  MRSA PCR Screening     Status: None   Collection Time: 09/21/18  2:34 AM  Result Value Ref Range Status   MRSA by PCR NEGATIVE NEGATIVE Final    Comment:        The GeneXpert MRSA Assay (FDA approved for NASAL specimens only), is one component of a comprehensive MRSA colonization surveillance program. It is not intended to diagnose MRSA infection nor to guide or monitor treatment for MRSA infections. Performed at [facility], [place] [address], [place], [place] [address]          Radiology Studies: Ct Abdomen Pelvis W Wo Contrast  Result Date: 09/27/2018 CLINICAL DATA:  Right renal and pancreatic lesions on recent noncontrast CT. Cirrhosis. Bilateral pleural effusions. EXAM: CT CHEST WITHOUT CONTRAST CT ABDOMEN AND PELVIS WITH AND WITHOUT CONTRAST TECHNIQUE: Multidetector CT imaging of the chest was performed prior to intravenous contrast administration. Multidetector CT imaging of the abdomen and pelvis was performed following the standard protocol before and during bolus administration of intravenous contrast. CONTRAST:  168mL OMNIPAQUE IOHEXOL 300 MG/ML  SOLN COMPARISON:  Noncontrast AP CT on 09/26/2018, and chest CT on 05/03/2018 FINDINGS: CT CHEST FINDINGS Cardiovascular: No acute findings. Aortic and coronary artery atherosclerosis. Mediastinum/Nodes: No masses or pathologically enlarged lymph nodes identified on this unenhanced exam. Lungs/Pleura: Moderate bilateral pleural effusions. Compressive atelectasis of both lung bases. Airspace disease with central air bronchograms seen in the left upper lobe, suspicious for pneumonia. Central tracheobronchial airways remain patent. Musculoskeletal:  No suspicious bone lesions. CT ABDOMEN AND PELVIS FINDINGS Hepatobiliary: Hepatic cirrhosis again demonstrated. No liver masses identified. Prior cholecystectomy. No evidence of biliary  obstruction. Pancreas: Solid mass is seen involving the pancreatic head which measures 4.4 x 4.0 cm, which shows arterial phase hyperenhancement. 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 No evidence of pancreatic ductal dilatation. Spleen: Moderate splenomegaly, consistent with portal venous hypertension. Adrenals/Urinary Tract: Absence of left kidney again seen. A large solid mass showing arterial phase hyperenhancement is seen involving the mid and lower pole of the right kidney. This measures 10.7 x 9.4 cm, consistent with renal cell carcinoma. This appears to be contained within Gerota's fascia. No evidence of hydronephrosis. No evidence of renal vein or IVC tumor thrombus. Stomach/Bowel: No evidence of obstruction, inflammatory process or abnormal fluid collections. Surgical mesh again seen in anterior abdominal wall. A small left abdominal ventral hernia is again seen containing a loop of distal transverse colon, and is stable. Vascular/Lymphatic: No pathologically enlarged lymph nodes. No abdominal aortic aneurysm. Aortic atherosclerosis. Reproductive: Prior hysterectomy noted. Adnexal  regions are unremarkable in appearance. Other: Mild ascites and diffuse mesenteric and body wall edema show no significant change. Musculoskeletal:  No suspicious bone lesions identified. IMPRESSION: 1. 10.7 cm solid mass involving the mid and lower pole of right kidney, consistent with renal cell carcinoma. No evidence of renal vein or IVC tumor thrombus. 2. 4.4 cm hypervascular mass in the pancreatic head. This favors hypervascular metastasis or pancreatic neuroendocrine tumor over pancreatic adenocarcinoma. 3. Moderate bilateral pleural effusions and compressive atelectasis. Airspace disease with central air bronchograms in left upper lobe, suspicious for pneumonia. 4. Hepatic cirrhosis and findings of portal venous hypertension. No radiographic evidence of hepatic neoplasm. 5.  Mild ascites, and diffuse mesenteric and body wall edema. 6. Stable small left abdominal ventral hernia containing loop of distal transverse colon. Electronically Signed   By: Earle Gell M.D.   On: 09/27/2018 10:03   Ct Abdomen Pelvis Wo Contrast  Result Date: 09/26/2018 CLINICAL DATA:  63 year old female with pancytopenia. Splenomegaly and cirrhosis. EXAM: CT ABDOMEN AND PELVIS WITHOUT CONTRAST TECHNIQUE: Multidetector CT imaging of the abdomen and pelvis was performed following the standard protocol without IV contrast. COMPARISON:  CT of the abdomen pelvis dated 08/23/2014 and ultrasound of the abdomen dated 09/04/2018 FINDINGS: Evaluation of this exam is limited in the absence of intravenous contrast as well as due to anasarca. Lower chest: Partially visualized moderate-sized bilateral pleural effusions with associated compressive atelectasis of the lower lobes. Superimposed pneumonia is not excluded. There is mild cardiomegaly. Coronary vascular calcifications noted. No intra-abdominal free air. There is a small ascites. Hepatobiliary: Morphologic changes of cirrhosis. Cholecystectomy. Pancreas: There is a 3.5 x 4.7 cm mass in the region of the head of the pancreas. There is atrophy of the body and tail of the pancreas. This mass may represent a primary pancreatic malignancy or metastatic disease/adenopathy. Spleen: Splenomegaly measuring up to 21 cm in greatest length. Adrenals/Urinary Tract: There is a solitary right kidney. There is a lobulated mass involving the inferior pole of the right kidney measuring approximately 8 x 11 cm in greatest axial dimensions and 14 cm in craniocaudal length most consistent with a neoplasm. There is no hydronephrosis or nephrolithiasis. The urinary bladder is decompressed around a Foley catheter and contains a small amount of air. Stomach/Bowel: There is a large amount of stool within the colon. There is a left spigelian hernia containing a segment of the colon. No  associated obstruction. The appendix is not identified with certainty. Vascular/Lymphatic: There is moderate aortoiliac atherosclerotic disease. There is dilatation of the right renal vein with apparent high attenuating content likely representing tumor thrombus. Further evaluation with MRI or CT with IV contrast recommended. No portal venous gas. There is no retroperitoneal adenopathy. Reproductive: Hysterectomy. There is a 2.7 x 2.1 cm ill-defined high attenuating structure in the region of the right adnexa (series 3, image 61). Other: Diffuse subcutaneous edema and anasarca. Musculoskeletal: Degenerative changes of the spine. No acute osseous pathology. IMPRESSION: 1. Large right renal inferior pole mass most consistent with malignancy. There is suggestion of extension of tumor thrombus into the right renal vein and possibly IVC. Further evaluation with MRI or CT with IV contrast recommended. 2. Rounded mass in the region of the head of the pancreas may represent a primary pancreatic neoplasm versus metastatic disease/adenopathy. 3. Cirrhosis with evidence of portal hypertension, splenomegaly, ascites and anasarca. 4. Partially visualized moderate-sized bilateral pleural effusions with associated compressive atelectasis of the lower lobes. Pneumonia is not excluded. Electronically Signed   By: Milas Hock  Radparvar M.D.   On: 09/26/2018 01:53   Ct Chest Wo Contrast  Result Date: 09/27/2018 CLINICAL DATA:  Right renal and pancreatic lesions on recent noncontrast CT. Cirrhosis. Bilateral pleural effusions. EXAM: CT CHEST WITHOUT CONTRAST CT ABDOMEN AND PELVIS WITH AND WITHOUT CONTRAST TECHNIQUE: Multidetector CT imaging of the chest was performed prior to intravenous contrast administration. Multidetector CT imaging of the abdomen and pelvis was performed following the standard protocol before and during bolus administration of intravenous contrast. CONTRAST:  157mL OMNIPAQUE IOHEXOL 300 MG/ML  SOLN COMPARISON:   Noncontrast AP CT on 09/26/2018, and chest CT on 05/03/2018 FINDINGS: CT CHEST FINDINGS Cardiovascular: No acute findings. Aortic and coronary artery atherosclerosis. Mediastinum/Nodes: No masses or pathologically enlarged lymph nodes identified on this unenhanced exam. Lungs/Pleura: Moderate bilateral pleural effusions. Compressive atelectasis of both lung bases. Airspace disease with central air bronchograms seen in the left upper lobe, suspicious for pneumonia. Central tracheobronchial airways remain patent. Musculoskeletal:  No suspicious bone lesions. CT ABDOMEN AND PELVIS FINDINGS Hepatobiliary: Hepatic cirrhosis again demonstrated. No liver masses identified. Prior cholecystectomy. No evidence of biliary obstruction. Pancreas: Solid mass is seen involving the pancreatic head which measures 4.4 x 4.0 cm, which shows arterial phase hyperenhancement. 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 No evidence of pancreatic ductal dilatation. Spleen: Moderate splenomegaly, consistent with portal venous hypertension. Adrenals/Urinary Tract: Absence of left kidney again seen. A large solid mass showing arterial phase hyperenhancement is seen involving the mid and lower pole of the right kidney. This measures 10.7 x 9.4 cm, consistent with renal cell carcinoma. This appears to be contained within Gerota's fascia. No evidence of hydronephrosis. No evidence of renal vein or IVC tumor thrombus. Stomach/Bowel: No evidence of obstruction, inflammatory process or abnormal fluid collections. Surgical mesh again seen in anterior abdominal wall. A small left abdominal ventral hernia is again seen containing a loop of distal transverse colon, and is stable. Vascular/Lymphatic: No pathologically enlarged lymph nodes. No abdominal aortic aneurysm. Aortic atherosclerosis. Reproductive: Prior hysterectomy noted. Adnexal regions are unremarkable in appearance. Other: Mild ascites and diffuse  mesenteric and body wall edema show no significant change. Musculoskeletal:  No suspicious bone lesions identified. IMPRESSION: 1. 10.7 cm solid mass involving the mid and lower pole of right kidney, consistent with renal cell carcinoma. No evidence of renal vein or IVC tumor thrombus. 2. 4.4 cm hypervascular mass in the pancreatic head. This favors hypervascular metastasis or pancreatic neuroendocrine tumor over pancreatic adenocarcinoma. 3. Moderate bilateral pleural effusions and compressive atelectasis. Airspace disease with central air bronchograms in left upper lobe, suspicious for pneumonia. 4. Hepatic cirrhosis and findings of portal venous hypertension. No radiographic evidence of hepatic neoplasm. 5. Mild ascites, and diffuse mesenteric and body wall edema. 6. Stable small left abdominal ventral hernia containing loop of distal transverse colon. Electronically Signed   By: Earle Gell M.D.   On: 09/27/2018 10:03        Scheduled Meds:  acetaminophen  650 mg Oral Q6H   chlorhexidine  15 mL Mouth Rinse BID   diclofenac sodium  2 g Topical QID   diltiazem  180 mg Oral Daily   furosemide  80 mg Intravenous BID   insulin aspart  0-15 Units Subcutaneous TID WC   insulin aspart  0-5 Units Subcutaneous QHS   insulin detemir  10 Units Subcutaneous BID   levothyroxine  88 mcg Oral Q0600   lidocaine  1 patch Transdermal Q24H   mouth rinse  15 mL Mouth Rinse  q12n4p   mometasone-formoterol  2 puff Inhalation BID   nortriptyline  25 mg Oral QHS   polyethylene glycol  17 g Oral BID   potassium chloride  40 mEq Oral TID   pravastatin  20 mg Oral Daily   Continuous Infusions:  sodium chloride 10 mL/hr at 09/22/18 1400     LOS: 7 days    Time spent: 35 minutes spent in the coordination of care today.    Jonnie Finner, DO [facility] Pager [phone]  If 7PM-7AM, please contact night-coverage [email or web address] Password [facility] 09/27/2018, 4:23 PM

## 2018-09-27 NOTE — Progress Notes (Signed)
RT Note:  Patient refusing Bipap at this time.  No apparent distress noted at this time.  Will continue to monitor.

## 2018-09-27 NOTE — Progress Notes (Signed)
IR consulted for renal mass biopsy.   Case reviewed by Dr. Pascal Lux who approves patient for procedure.  Patient to be NPO p MN. INR pending. Plan to proceed with procedure tomorrow as schedule allows.  Brynda Greathouse, MS RD PA-C 4:13 PM

## 2018-09-28 ENCOUNTER — Inpatient Hospital Stay (HOSPITAL_COMMUNITY): Payer: Medicare Other

## 2018-09-28 DIAGNOSIS — R935 Abnormal findings on diagnostic imaging of other abdominal regions, including retroperitoneum: Secondary | ICD-10-CM

## 2018-09-28 LAB — CBC WITH DIFFERENTIAL/PLATELET
Abs Immature Granulocytes: 0.15 10*3/uL — ABNORMAL HIGH (ref 0.00–0.07)
Basophils Absolute: 0 10*3/uL (ref 0.0–0.1)
Basophils Relative: 0 %
Eosinophils Absolute: 0 10*3/uL (ref 0.0–0.5)
Eosinophils Relative: 0 %
HCT: 31.5 % — ABNORMAL LOW (ref 36.0–46.0)
Hemoglobin: 8.7 g/dL — ABNORMAL LOW (ref 12.0–15.0)
Immature Granulocytes: 2 %
Lymphocytes Relative: 4 %
Lymphs Abs: 0.3 10*3/uL — ABNORMAL LOW (ref 0.7–4.0)
MCH: 25 pg — ABNORMAL LOW (ref 26.0–34.0)
MCHC: 27.6 g/dL — ABNORMAL LOW (ref 30.0–36.0)
MCV: 90.5 fL (ref 80.0–100.0)
Monocytes Absolute: 0.7 10*3/uL (ref 0.1–1.0)
Monocytes Relative: 9 %
Neutro Abs: 7 10*3/uL (ref 1.7–7.7)
Neutrophils Relative %: 85 %
Platelets: 101 10*3/uL — ABNORMAL LOW (ref 150–400)
RBC: 3.48 MIL/uL — ABNORMAL LOW (ref 3.87–5.11)
RDW: 20.2 % — ABNORMAL HIGH (ref 11.5–15.5)
WBC: 8.1 10*3/uL (ref 4.0–10.5)
nRBC: 0.2 % (ref 0.0–0.2)

## 2018-09-28 LAB — LACTATE DEHYDROGENASE: LDH: 171 U/L (ref 98–192)

## 2018-09-28 LAB — GLUCOSE, CAPILLARY
Glucose-Capillary: 115 mg/dL — ABNORMAL HIGH (ref 70–99)
Glucose-Capillary: 95 mg/dL (ref 70–99)
Glucose-Capillary: 135 mg/dL — ABNORMAL HIGH (ref 70–99)
Glucose-Capillary: 138 mg/dL — ABNORMAL HIGH (ref 70–99)
Glucose-Capillary: 143 mg/dL — ABNORMAL HIGH (ref 70–99)

## 2018-09-28 MED ORDER — ONDANSETRON HCL 4 MG/2ML IJ SOLN
INTRAMUSCULAR | Status: AC
  Filled 2018-09-28: qty 2

## 2018-09-28 MED ORDER — LIDOCAINE-EPINEPHRINE 1 %-1:100000 IJ SOLN
INTRAMUSCULAR | Status: AC
  Filled 2018-09-28: qty 1

## 2018-09-28 MED ORDER — SENNOSIDES-DOCUSATE SODIUM 8.6-50 MG PO TABS
2.0000 | ORAL_TABLET | Freq: Two times a day (BID) | ORAL | Status: DC
  Administered 2018-09-29 – 2018-10-02 (×6): 2 via ORAL
  Filled 2018-09-28 (×7): qty 2

## 2018-09-28 MED ORDER — SENNOSIDES-DOCUSATE SODIUM 8.6-50 MG PO TABS
3.0000 | ORAL_TABLET | Freq: Once | ORAL | Status: AC
  Administered 2018-09-28: 3 via ORAL
  Filled 2018-09-28: qty 3

## 2018-09-28 MED ORDER — MIDAZOLAM HCL 2 MG/2ML IJ SOLN
INTRAMUSCULAR | Status: AC | PRN
  Administered 2018-09-28: 0.5 mg via INTRAVENOUS

## 2018-09-28 MED ORDER — MIDAZOLAM HCL 2 MG/2ML IJ SOLN
INTRAMUSCULAR | Status: AC
  Filled 2018-09-28: qty 2

## 2018-09-28 MED ORDER — ONDANSETRON HCL 4 MG/2ML IJ SOLN
INTRAMUSCULAR | Status: AC | PRN
  Administered 2018-09-28: 4 mg via INTRAVENOUS

## 2018-09-28 MED ORDER — FENTANYL CITRATE (PF) 100 MCG/2ML IJ SOLN
INTRAMUSCULAR | Status: AC
  Filled 2018-09-28: qty 2

## 2018-09-28 MED ORDER — SENNA 8.6 MG PO TABS: 2.0000 | ORAL_TABLET | Freq: Every day | ORAL | Status: DC

## 2018-09-28 MED ORDER — GELATIN ABSORBABLE 12-7 MM EX MISC
CUTANEOUS | Status: AC
  Filled 2018-09-28: qty 1

## 2018-09-28 NOTE — Progress Notes (Signed)
Pt refusing bipap at this time and is resting comfortably. RT will place pt on bipap if needed. Will continue to monitor as needed.

## 2018-09-28 NOTE — Progress Notes (Signed)
Sleeping soundly. Resp easy and nonlabored. Remains on oxygen at 4 L/M [place]

## 2018-09-28 NOTE — Progress Notes (Signed)
MEDICATION-RELATED CONSULT NOTE   IR Procedure Consult - Anticoagulant/Antiplatelet PTA/Inpatient Med List Review by Pharmacist    Procedure: Renal biopsy    Completed: 5/14  Post-Procedural bleeding risk per IR MD assessment:  Low  Antithrombotic medications on inpatient or PTA profile prior to procedure:   None, on SCDs only    Recommended restart time per IR Post-Procedure Guidelines:  Not applicable   Other considerations:      Plan:  Continue SCDs  Minda Ditto PharmD 09/28/2018, 10:53 AM

## 2018-09-28 NOTE — Progress Notes (Addendum)
Daily Rounding Note  09/28/2018, 11:06 AM  LOS: 8 days   SUBJECTIVE:   Chief complaint: Pancreatic mass.  Cirrhosis of liver.  Renal mass     Constipated.  Has had manual disimpaction of small amunts stools in previous 2 days, requesting disimpaction now.  No nausea.   Says her breathing is fine though obviously SOB during conversation. Underwent renal mass biopsy this AM.     OBJECTIVE:         Vital signs in last 24 hours:    Temp:  [97.6 F (36.4 C)-98.2 F (36.8 C)] 98.1 F (36.7 C) (05/14 1030) Pulse Rate:  [65-102] 102 (05/14 0945) Resp:  [12-23] 20 (05/14 0945) BP: (95-159)/(50-93) 114/60 (05/14 1100) SpO2:  [87 %-97 %] 90 % (05/14 0945) Weight:  [136.8 kg] 136.8 kg (05/14 0435) Last BM Date: 09/23/18(per day shift nurse pt is completely impacted) Filed Weights   09/25/18 1312 09/26/18 0300 09/28/18 0435  Weight: 135.9 kg 94.8 kg (!) 136.8 kg   General: looks ill, dyspneic   Heart: RRR Chest: SOB while at rest and with speaking Abdomen: obese, soft, NT.  BS hypoactive  Extremities: + brawny edema Neuro/Psych:  Oriented x 3.  Moves all 4 limbs.  Fluid speech.    Intake/Output from previous day: 05/13 0701 - 05/14 0700 In: 744 [P.O.:744] Out: 1850 [Urine:1850]  Intake/Output this shift: No intake/output data recorded.  Lab Results: Recent Labs    09/27/18 0429 09/27/18 0724 09/28/18 0420  WBC 5.8 6.4 8.1  HGB 8.4* 8.5* 8.7*  HCT 29.4* 30.1* 31.5*  PLT 83* 83* 101*   BMET Recent Labs    09/26/18 0539 09/26/18 1603 09/27/18 0429  NA 136 136 139  K 2.6* 3.0* 3.3*  CL 75* 82* 81*  CO2 44* 48* 45*  GLUCOSE 104* 152* 100*  BUN 73* 68* 64*  CREATININE 1.17* 1.22* 1.19*  CALCIUM 8.6* 8.5* 8.8*   LFT Recent Labs    09/26/18 0539 09/27/18 0429  PROT 5.0* 5.4*  ALBUMIN 2.9* 2.9*  AST 16 17  ALT 28 24  ALKPHOS 115 107  BILITOT 0.7 1.0   PT/INR Recent Labs    09/27/18 1634    LABPROT 14.4  INR 1.1    Studies/Results: Ct Abdomen Pelvis W Wo Contrast Ct Chest Wo Contrast   Result Date: 09/27/2018 CLINICAL DATA:  Right renal and pancreatic lesions on recent noncontrast CT. Cirrhosis. Bilateral pleural effusions. EXAM: CT CHEST WITHOUT CONTRAST CT ABDOMEN AND PELVIS WITH AND WITHOUT CONTRAST TECHNIQUE: Multidetector CT imaging of the chest was performed prior to intravenous contrast administration. Multidetector CT imaging of the abdomen and pelvis was performed following the standard protocol before and during bolus administration of intravenous contrast. CONTRAST:  151mL OMNIPAQUE IOHEXOL 300 MG/ML  SOLN COMPARISON:  Noncontrast AP CT on 09/26/2018, and chest CT on 05/03/2018 FINDINGS: CT CHEST FINDINGS Cardiovascular: No acute findings. Aortic and coronary artery atherosclerosis. Mediastinum/Nodes: No masses or pathologically enlarged lymph nodes identified on this unenhanced exam. Lungs/Pleura: Moderate bilateral pleural effusions. Compressive atelectasis of both lung bases. Airspace disease with central air bronchograms seen in the left upper lobe, suspicious for pneumonia. Central tracheobronchial airways remain patent. Musculoskeletal:  No suspicious bone lesions. CT ABDOMEN AND PELVIS FINDINGS Hepatobiliary: Hepatic cirrhosis again demonstrated. No liver masses identified. Prior cholecystectomy. No evidence of biliary obstruction. Pancreas: Solid mass is seen involving the pancreatic head which measures 4.4 x 4.0 cm, which shows arterial phase hyperenhancement. This  favors a pancreatic neuroendocrine tumor or metastasis over pancreatic carcinoma. No evidence of pancreatic ductal dilatation. Spleen: Moderate splenomegaly, consistent with portal venous hypertension. Adrenals/Urinary Tract: Absence of left kidney again seen. A large solid mass showing arterial phase hyperenhancement is seen involving the mid and lower pole of the right kidney. This measures 10.7 x 9.4 cm,  consistent with renal cell carcinoma. This appears to be contained within Gerota's fascia. No evidence of hydronephrosis. No evidence of renal vein or IVC tumor thrombus. Stomach/Bowel: No evidence of obstruction, inflammatory process or abnormal fluid collections. Surgical mesh again seen in anterior abdominal wall. A small left abdominal ventral hernia is again seen containing a loop of distal transverse colon, and is stable. Vascular/Lymphatic: No pathologically enlarged lymph nodes. No abdominal aortic aneurysm. Aortic atherosclerosis. Reproductive: Prior hysterectomy noted. Adnexal regions are unremarkable in appearance. Other: Mild ascites and diffuse mesenteric and body wall edema show no significant change. Musculoskeletal:  No suspicious bone lesions identified. IMPRESSION: 1. 10.7 cm solid mass involving the mid and lower pole of right kidney, consistent with renal cell carcinoma. No evidence of renal vein or IVC tumor thrombus. 2. 4.4 cm hypervascular mass in the pancreatic head. This favors hypervascular metastasis or pancreatic neuroendocrine tumor over pancreatic adenocarcinoma. 3. Moderate bilateral pleural effusions and compressive atelectasis. Airspace disease with central air bronchograms in left upper lobe, suspicious for pneumonia. 4. Hepatic cirrhosis and findings of portal venous hypertension. No radiographic evidence of hepatic neoplasm. 5. Mild ascites, and diffuse mesenteric and body wall edema. 6. Stable small left abdominal ventral hernia containing loop of distal transverse colon. Electronically Signed   By: Earle Gell M.D.   On: 09/27/2018 10:03     Ct Biopsy  Result Date: 09/28/2018 INDICATION: Remote history of left-sided RCC, post nephrectomy, now with large right-sided renal mass worry for contralateral RCC. EXAM: CT BIOPSY COMPARISON:  CT the chest, abdomen pelvis-09/27/2018 MEDICATIONS: Zofran 4 mg IV ANESTHESIA/SEDATION: Versed 0.5 mg IV Sedation time: 22 minutes; The  patient was continuously monitored during the procedure by the interventional radiology nurse under my direct supervision. CONTRAST:  None. COMPLICATIONS: None immediate. PROCEDURE: Informed consent was obtained from the patient following an explanation of the procedure, risks, benefits and alternatives. A time out was performed prior to the initiation of the procedure. The patient was positioned supine on the CT table and a limited CT was performed for procedural planning demonstrating unchanged size and appearance of the infiltrative mass involving the caudal aspect the right kidney with dominant component measuring 9.1 x 8.4 cm (image 51, series 2). The procedure was planned. The operative site was prepped and draped in the usual sterile fashion. Appropriate trajectory was confirmed with a 22 gauge spinal needle after the adjacent tissues were anesthetized with 1% Lidocaine with epinephrine. Under intermittent CT guidance, a 17 gauge coaxial needle was advanced into the peripheral aspect of the mass. Appropriate positioning was confirmed and 5 core needle biopsy samples were obtained with an 18 gauge core needle biopsy device. The co-axial needle was removed following the administration of a Gel-Foam slurry and superficial hemostasis was achieved with manual compression. A limited postprocedural CT was negative for hemorrhage or additional complication. A dressing was placed. The patient tolerated the procedure well without immediate postprocedural complication. IMPRESSION: Technically successful CT guided core needle biopsy of infiltrative mass involving the right kidney. Electronically Signed   By: Sandi Mariscal M.D.   On: 09/28/2018 10:45   Scheduled Meds:  acetaminophen  650 mg Oral Q6H  chlorhexidine  15 mL Mouth Rinse BID   diclofenac sodium  2 g Topical QID   diltiazem  180 mg Oral Daily   furosemide  80 mg Intravenous BID   gelatin adsorbable       insulin aspart  0-15 Units Subcutaneous  TID WC   insulin aspart  0-5 Units Subcutaneous QHS   insulin detemir  10 Units Subcutaneous BID   levothyroxine  88 mcg Oral Q0600   lidocaine  1 patch Transdermal Q24H   lidocaine-EPINEPHrine       mouth rinse  15 mL Mouth Rinse q12n4p   midazolam       mometasone-formoterol  2 puff Inhalation BID   nortriptyline  25 mg Oral QHS   ondansetron       polyethylene glycol  17 g Oral BID   potassium chloride  40 mEq Oral TID   pravastatin  20 mg Oral Daily   Continuous Infusions:  sodium chloride 10 mL/hr at 09/22/18 1400   PRN Meds:.sodium chloride, albuterol, bisacodyl, dextromethorphan-guaiFENesin, ipratropium-albuterol, lactulose, LORazepam, metoprolol tartrate, ondansetron (ZOFRAN) IV, oxyCODONE, sodium chloride flush   ASSESMENT:   *    Mass at the head of the pancreas.  CA 19-9 is 51.  LFTs normal. ? primary pancreatic malignancy, neuroendocrine tumor, or metastatic disease Dr Marin Olp suspects pancreatic cancer.   Poor candidate for endoscopic/EUS eval due to sedation risk.   *    R renal mass. Underwent biopsy today.    *      Cirrhosis of the liver.   Ascites, anasarca. Hx hepatitis C treated with Solvadi/ribavirin.  Achieved SVR. PT/INR wnl.    *     Chronic splenomegaly and thrombocytopenia.   *   Constipation.  On BID miralax, prn dulcolax PR and prn lactulose.     *      Pancytopenia.  1 U PRBC on 5/1, 1 U PRBC on 5/10. So far good response to Neupogen.  WBCs 1.8 >> 8.1  *      Hypokalemia.  Improved, persists.    *      Acute on chronic hypercarbic respiratory failure.  Cor pulmonale, right heart failure.  Pleural effusions.    *    AKI >> CKD.    *   Claustrophobia.      PLAN   *   Dr Rush Landmark will see pt today.   Await path report from kidney biospy.    *   MRI abdomen ordered by Dr Marin Olp yesterday, not yet done.    *   Adding Senokot  *   Consider Pall care [place] consult once renal bx resulted.        Azucena Freed   09/28/2018, 11:06 AM Phone [phone]

## 2018-09-28 NOTE — Progress Notes (Signed)
We still are in a "wait mode."  She has been seen by gastroenterology.  Not sure when the upper endoscopy is going be done or if it will be done.  She still not has had the MRI.  Her CA 19-9 is elevated at 71.  As such, I have to believe that she probably will have pancreatic cancer.  She may have the biopsy of the right renal mass today.  I think if she has 2 separate malignancies, clearly, the worst 1 will be the pancreatic cancer.  She responded very nicely to the Neupogen.  Her white cell count went up to 8.1.  As such, the leukopenia that she had had to be iatrogenic and I will think transient.  Her hemoglobin is 8.7.  This will have to be watched closely.  Her platelet count is coming back up.  It is 101,000.  She still has of the other health issues.  These will clearly be a factor in deciding whether or not she would be a candidate for treatment.  Again, I doubt that she would even be considered a surgical candidate.  I do still see that she would do well with surgery and being intubated.  We still have to wait for biopsies to be done.  Again, I believe that the real key biopsy is going to be the pancreas.  If she truly has pancreatic cancer, I think the only option for this is going to be radiation therapy.  I appreciate everybody's care on 2 W.  I know this is a very complicated situation.  Lattie Haw, MD  Oswaldo Milian 26:3

## 2018-09-28 NOTE — Consult Note (Addendum)
Chief Complaint: Patient was seen in consultation today for right renal mass biopsy Chief Complaint  Patient presents with   Altered Mental Status   at the request of Dr Pearletha Alfred  Supervising Physician: Sandi Mariscal  Patient Status: [facility] - In-pt  History of Present Illness: Renee Noble is a 63 y.o. female   Morbid obese Chronic hypoxia COPD; OHS; OSA; Chronic HF PAF-- not on anticoagulation-- chronic thrombocytopenia Recent anemia DM; Hyperkalemia Was admitted 5/6 with AMS  Leukopenia-- 8.1 today   Now mentally better Work up reveals: CT IMPRESSION: 1. 10.7 cm solid mass involving the mid and lower pole of right kidney, consistent with renal cell carcinoma. No evidence of renal vein or IVC tumor thrombus. 2. 4.4 cm hypervascular mass in the pancreatic head. This favors hypervascular metastasis or pancreatic neuroendocrine tumor over pancreatic adenocarcinoma. 3. Moderate bilateral pleural effusions and compressive atelectasis. Airspace disease with central air bronchograms in left upper lobe, suspicious for pneumonia. 4. Hepatic cirrhosis and findings of portal venous hypertension. No radiographic evidence of hepatic neoplasm. 5. Mild ascites, and diffuse mesenteric and body wall edema. 6. Stable small left abdominal ventral hernia containing loop of distal transverse colon.  Dr Marin Olp needing biopsy for tissue diagnosis   Past Medical History:  Diagnosis Date   Abdominal mass of other site    Cervical compression fracture (HCC)    CHF (congestive heart failure) (HCC)    Chronic kidney disease, stage 3 (HCC)    Borderline Stage 2-3   Chronic lower limb pain    COPD (chronic obstructive pulmonary disease) (HCC)    CTS (carpal tunnel syndrome)    Depression    Diabetes ([place])    Type II   Fibromyalgia    Hepatitis C    cured last year (2018)   Hypercholesteremia    Hypertension    Hypothyroidism    Morbid obesity (HCC)     Neuropathy    Diabetes   OSA (obstructive sleep apnea)    Osteoarthritis    Renal cancer (HCC)    Left Kidney Removed   RLS (restless legs syndrome)    Syncope    Venous stasis     Past Surgical History:  Procedure Laterality Date   ABDOMINAL HERNIA REPAIR     x2   ABDOMINAL HYSTERECTOMY     BLADDER SUSPENSION     CHOLECYSTECTOMY     CYST EXCISION     Head   INCISIONAL HERNIA REPAIR     KNEE ARTHROSCOPY     Bilateral   NEPHRECTOMY     Left   TONSILLECTOMY     TOOTH EXTRACTION     UMBILICAL HERNIA REPAIR     VAGINA SURGERY     WISDOM TOOTH EXTRACTION      Allergies: Gabapentin; Lyrica [pregabalin]; Ketorolac tromethamine; and Lisinopril  Medications: Prior to Admission medications   Medication Sig Start Date End Date Taking? Authorizing Provider  albuterol (PROAIR HFA) 108 (90 Base) MCG/ACT inhaler Inhale 1-2 puffs into the lungs every 6 (six) hours as needed for wheezing or shortness of breath. 01/20/18  Yes Tanda Rockers, MD  arformoterol (BROVANA) 15 MCG/2ML NEBU Take 2 mLs (15 mcg total) by nebulization 2 (two) times daily. 09/16/18  Yes Pahwani, Einar Grad, MD  budesonide-formoterol (SYMBICORT) 160-4.5 MCG/ACT inhaler Inhale 2 puffs into the lungs 2 (two) times daily. 06/29/18  Yes Tanda Rockers, MD  clotrimazole-betamethasone (LOTRISONE) cream Apply 1 application topically 2 (two) times daily as needed. Patient taking differently:  Apply 1 application topically 2 (two) times daily as needed (yeast).  05/02/18  Yes [name], PA-C  dextromethorphan-guaiFENesin [facility] DM) 30-600 MG 12hr tablet Take 1 tablet by mouth 2 (two) times daily as needed for cough. 06/15/18  Yes Hosie Poisson, MD  diltiazem (CARDIZEM CD) 180 MG 24 hr capsule Take 1 capsule (180 mg total) by mouth daily. 06/02/18  Yes [name], PA-C  furosemide (LASIX) 40 MG tablet Take 1 tablet (40 mg total) by mouth 2 (two) times daily. 09/16/18  Yes Pahwani, Einar Grad, MD  insulin  detemir (LEVEMIR) 100 UNIT/ML injection Inject 0.1 mLs (10 Units total) into the skin 2 (two) times daily. 09/16/18  Yes Pahwani, Einar Grad, MD  insulin lispro (HUMALOG) 100 UNIT/ML injection Inject 0.15 mLs (15 Units total) into the skin 3 (three) times daily as needed for high blood sugar. Uses sliding scale. Sugar over 200 will give insulin. Patient taking differently: Inject 1-10 Units into the skin See admin instructions. at 0600, 1100, 1600, 2100 per Sliding Scale: 141-170 1 unit 171-200 2 units 201-230 3 units 231-260 4 units 261-290 5 units 291-320 6 units 321-350 7 units 351-380 8 units 381-400 10 units 07/14/18  Yes [name], PA-C  ipratropium-albuterol (DUONEB) 0.5-2.5 (3) MG/3ML SOLN Take 3 mLs by nebulization 4 (four) times daily. Dx: J44.9 03/21/18  Yes Parrett, [name] S, NP  levothyroxine (SYNTHROID, LEVOTHROID) 88 MCG tablet TAKE ONE TABLET BY MOUTH DAILY Patient taking differently: Take 88 mcg by mouth every evening.  07/20/18  Yes [name], PA-C  morphine (MS CONTIN) 30 MG 12 hr tablet Take 1 tablet (30 mg total) by mouth every 12 (twelve) hours. 09/16/18  Yes Darliss Cheney, MD  Multiple Vitamins-Minerals (CENTRUM SILVER PO) Take by mouth.   Yes [provider]  nortriptyline (PAMELOR) 25 MG capsule Take 1 capsule (25 mg total) by mouth at bedtime. 06/21/18  Yes [name], PA-C  nystatin (MYCOSTATIN/NYSTOP) powder APPLY TOPICALLY TWICE DAILY AS NEEDED FOR YEAST INFECTION Patient taking differently: Apply 1 g topically 2 (two) times daily as needed (yeast).  09/27/17  Yes [name], PA-C  ondansetron (ZOFRAN) 4 MG tablet Take 4 mg by mouth 3 (three) times daily before meals. For 3 days 5.5.20 to 5.7.20   Yes [provider]  OXYGEN Inhale 3.5 L into the lungs continuous. O2 3.5L at home.   Yes [provider]  potassium chloride SA (K-DUR) 20 MEQ tablet Take 2 tablets (40 mEq total) by mouth daily. 09/16/18  Yes Pahwani, Einar Grad, MD    pravastatin (PRAVACHOL) 20 MG tablet TAKE ONE TABLET BY MOUTH EVERY DAY Patient taking differently: Take 20 mg by mouth every evening.  05/05/18  Yes [name], PA-C  promethazine (PHENERGAN) 25 MG tablet Take 25 mg by mouth every 6 (six) hours as needed for nausea or vomiting.   Yes [provider]  sodium polystyrene (KAYEXALATE) 15 GM/60ML suspension Take 30 g by mouth See admin instructions. 30g daily for two days. 5.6.20 thru 5.7.20   Yes [provider]  glucose blood (ACCU-CHEK AVIVA) test strip Check blood sugars 4 times per day for diabetes. Dx:E11.29 07/24/18   [name], PA-C  insulin glargine (LANTUS) 100 UNIT/ML injection Inject 30 Units into the skin daily.    [provider]     Family History  Problem Relation Age of Onset   Heart attack Mother 31       Deceased   Heart disease  Mother    Emphysema Mother    Alcoholism Mother    COPD Father 3       Deceased   Emphysema Father    Alcoholism Father    Esophageal varices Father    Alcoholism Paternal Grandfather    Diabetes Maternal Grandmother    Heart disease Maternal Grandmother    Lung cancer Maternal Grandfather    Emphysema Maternal Grandfather    Brain cancer Maternal Aunt    Diabetes Sister    Heart defect Sister    Cancer Sister        was told her sister had cancer but beat it and dont know which one    Heart defect Sister    Obesity Son    Breast cancer Maternal Aunt    Colon cancer Neg Hx    Esophageal cancer Neg Hx     Social History   Socioeconomic History   Marital status: Widowed    Spouse name: Not on file   Number of children: 1   Years of education: Not on file   Highest education level: Not on file  Occupational History   Occupation: Retired  Scientist, product/process development strain: Not on file   Food insecurity:    Worry: Not on file    Inability: Not on file   Transportation needs:    Medical: Not on file     Non-medical: Not on file  Tobacco Use   Smoking status: Former Smoker    Packs/day: 2.50    Years: 45.00    Pack years: 112.50    Types: Cigarettes    Last attempt to quit: 07/21/2016    Years since quitting: 2.1   Smokeless tobacco: Never Used  Substance and Sexual Activity   Alcohol use: No   Drug use: No   Sexual activity: Never  Lifestyle   Physical activity:    Days per week: Not on file    Minutes per session: Not on file   Stress: Not on file  Relationships   Social connections:    Talks on phone: Not on file    Gets together: Not on file    Attends religious service: Not on file    Active member of club or organization: Not on file    Attends meetings of clubs or organizations: Not on file    Relationship status: Not on file  Other Topics Concern   Not on file  Social History Narrative   Not on file    Review of Systems: A 12 point ROS discussed and pertinent positives are indicated in the HPI above.  All other systems are negative.  Review of Systems  Constitutional: Positive for activity change, diaphoresis and fatigue. Negative for fever.  Respiratory: Positive for shortness of breath and wheezing.   Cardiovascular: Negative for chest pain.  Gastrointestinal: Positive for abdominal pain and nausea.  Musculoskeletal: Positive for gait problem.  Neurological: Positive for weakness.  Psychiatric/Behavioral: Negative for behavioral problems and confusion.    Vital Signs: BP (!) 95/54 (BP Location: Right Wrist)    Pulse 91    Temp 97.8 F (36.6 C) (Axillary)    Resp (!) 23    Ht 5\' 4"  (1.626 m)    Wt (!) 301 lb 9.4 oz (136.8 kg)    SpO2 90%    BMI 51.77 kg/m   Physical Exam Vitals signs reviewed.  Constitutional:      Appearance: She is ill-appearing.  Cardiovascular:  Rate and Rhythm: Normal rate and regular rhythm.     Heart sounds: Normal heart sounds.  Pulmonary:     Breath sounds: Wheezing and rhonchi present.  Abdominal:      Tenderness: There is abdominal tenderness.  Musculoskeletal: Normal range of motion.  Skin:    General: Skin is warm and dry.  Neurological:     Mental Status: She is alert and oriented to person, place, and time.     Comments: Can answer all questions correctly Knows name; dob; place and reason for exam  Psychiatric:        Mood and Affect: Mood normal.        Behavior: Behavior normal.        Thought Content: Thought content normal.        Judgment: Judgment normal.     Imaging: Ct Abdomen Pelvis W Wo Contrast  Result Date: 09/27/2018 CLINICAL DATA:  Right renal and pancreatic lesions on recent noncontrast CT. Cirrhosis. Bilateral pleural effusions. EXAM: CT CHEST WITHOUT CONTRAST CT ABDOMEN AND PELVIS WITH AND WITHOUT CONTRAST TECHNIQUE: Multidetector CT imaging of the chest was performed prior to intravenous contrast administration. Multidetector CT imaging of the abdomen and pelvis was performed following the standard protocol before and during bolus administration of intravenous contrast. CONTRAST:  118mL OMNIPAQUE IOHEXOL 300 MG/ML  SOLN COMPARISON:  Noncontrast AP CT on 09/26/2018, and chest CT on 05/03/2018 FINDINGS: CT CHEST FINDINGS Cardiovascular: No acute findings. Aortic and coronary artery atherosclerosis. Mediastinum/Nodes: No masses or pathologically enlarged lymph nodes identified on this unenhanced exam. Lungs/Pleura: Moderate bilateral pleural effusions. Compressive atelectasis of both lung bases. Airspace disease with central air bronchograms seen in the left upper lobe, suspicious for pneumonia. Central tracheobronchial airways remain patent. Musculoskeletal:  No suspicious bone lesions. CT ABDOMEN AND PELVIS FINDINGS Hepatobiliary: Hepatic cirrhosis again demonstrated. No liver masses identified. Prior cholecystectomy. No evidence of biliary obstruction. Pancreas: Solid mass is seen involving the pancreatic head which measures 4.4 x 4.0 cm, which shows arterial phase  hyperenhancement. 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 No evidence of pancreatic ductal dilatation. Spleen: Moderate splenomegaly, consistent with portal venous hypertension. Adrenals/Urinary Tract: Absence of left kidney again seen. A large solid mass showing arterial phase hyperenhancement is seen involving the mid and lower pole of the right kidney. This measures 10.7 x 9.4 cm, consistent with renal cell carcinoma. This appears to be contained within Gerota's fascia. No evidence of hydronephrosis. No evidence of renal vein or IVC tumor thrombus. Stomach/Bowel: No evidence of obstruction, inflammatory process or abnormal fluid collections. Surgical mesh again seen in anterior abdominal wall. A small left abdominal ventral hernia is again seen containing a loop of distal transverse colon, and is stable. Vascular/Lymphatic: No pathologically enlarged lymph nodes. No abdominal aortic aneurysm. Aortic atherosclerosis. Reproductive: Prior hysterectomy noted. Adnexal regions are unremarkable in appearance. Other: Mild ascites and diffuse mesenteric and body wall edema show no significant change. Musculoskeletal:  No suspicious bone lesions identified. IMPRESSION: 1. 10.7 cm solid mass involving the mid and lower pole of right kidney, consistent with renal cell carcinoma. No evidence of renal vein or IVC tumor thrombus. 2. 4.4 cm hypervascular mass in the pancreatic head. This favors hypervascular metastasis or pancreatic neuroendocrine tumor over pancreatic adenocarcinoma. 3. Moderate bilateral pleural effusions and compressive atelectasis. Airspace disease with central air bronchograms in left upper lobe, suspicious for pneumonia. 4. Hepatic cirrhosis and findings of portal venous hypertension. No radiographic evidence of hepatic neoplasm. 5. Mild ascites,  and diffuse mesenteric and body wall edema. 6. Stable small left abdominal ventral hernia containing loop of distal  transverse colon. Electronically Signed   By: Earle Gell M.D.   On: 09/27/2018 10:03   Ct Abdomen Pelvis Wo Contrast  Result Date: 09/26/2018 CLINICAL DATA:  63 year old female with pancytopenia. Splenomegaly and cirrhosis. EXAM: CT ABDOMEN AND PELVIS WITHOUT CONTRAST TECHNIQUE: Multidetector CT imaging of the abdomen and pelvis was performed following the standard protocol without IV contrast. COMPARISON:  CT of the abdomen pelvis dated 08/23/2014 and ultrasound of the abdomen dated 09/04/2018 FINDINGS: Evaluation of this exam is limited in the absence of intravenous contrast as well as due to anasarca. Lower chest: Partially visualized moderate-sized bilateral pleural effusions with associated compressive atelectasis of the lower lobes. Superimposed pneumonia is not excluded. There is mild cardiomegaly. Coronary vascular calcifications noted. No intra-abdominal free air. There is a small ascites. Hepatobiliary: Morphologic changes of cirrhosis. Cholecystectomy. Pancreas: There is a 3.5 x 4.7 cm mass in the region of the head of the pancreas. There is atrophy of the body and tail of the pancreas. This mass may represent a primary pancreatic malignancy or metastatic disease/adenopathy. Spleen: Splenomegaly measuring up to 21 cm in greatest length. Adrenals/Urinary Tract: There is a solitary right kidney. There is a lobulated mass involving the inferior pole of the right kidney measuring approximately 8 x 11 cm in greatest axial dimensions and 14 cm in craniocaudal length most consistent with a neoplasm. There is no hydronephrosis or nephrolithiasis. The urinary bladder is decompressed around a Foley catheter and contains a small amount of air. Stomach/Bowel: There is a large amount of stool within the colon. There is a left spigelian hernia containing a segment of the colon. No associated obstruction. The appendix is not identified with certainty. Vascular/Lymphatic: There is moderate aortoiliac atherosclerotic  disease. There is dilatation of the right renal vein with apparent high attenuating content likely representing tumor thrombus. Further evaluation with MRI or CT with IV contrast recommended. No portal venous gas. There is no retroperitoneal adenopathy. Reproductive: Hysterectomy. There is a 2.7 x 2.1 cm ill-defined high attenuating structure in the region of the right adnexa (series 3, image 61). Other: Diffuse subcutaneous edema and anasarca. Musculoskeletal: Degenerative changes of the spine. No acute osseous pathology. IMPRESSION: 1. Large right renal inferior pole mass most consistent with malignancy. There is suggestion of extension of tumor thrombus into the right renal vein and possibly IVC. Further evaluation with MRI or CT with IV contrast recommended. 2. Rounded mass in the region of the head of the pancreas may represent a primary pancreatic neoplasm versus metastatic disease/adenopathy. 3. Cirrhosis with evidence of portal hypertension, splenomegaly, ascites and anasarca. 4. Partially visualized moderate-sized bilateral pleural effusions with associated compressive atelectasis of the lower lobes. Pneumonia is not excluded. Electronically Signed   By: Anner Crete M.D.   On: 09/26/2018 01:53   Dg Chest 2 View  Result Date: 09/25/2018 CLINICAL DATA:  Heart failure EXAM: CHEST - 2 VIEW COMPARISON:  10/02/2018 FINDINGS: Cardiac enlargement. Pulmonary vascular congestion. Progression of diffuse bilateral airspace disease with particular prominence in the left upper lobe. Small bilateral effusions. Atherosclerotic aortic arch. IMPRESSION: Progression of congestive heart failure with edema and small effusions. Electronically Signed   By: Franchot Gallo M.D.   On: 09/25/2018 07:33   Dg Abd 1 View  Result Date: 09/06/2018 CLINICAL DATA:  OG tube placement EXAM: ABDOMEN - 1 VIEW COMPARISON:  None. FINDINGS: OG tube tip in the mid stomach.  IMPRESSION: OG tube tip in the mid stomach. Electronically  Signed   By: Rolm Baptise M.D.   On: 09/06/2018 23:56   Ct Chest Wo Contrast  Result Date: 09/27/2018 CLINICAL DATA:  Right renal and pancreatic lesions on recent noncontrast CT. Cirrhosis. Bilateral pleural effusions. EXAM: CT CHEST WITHOUT CONTRAST CT ABDOMEN AND PELVIS WITH AND WITHOUT CONTRAST TECHNIQUE: Multidetector CT imaging of the chest was performed prior to intravenous contrast administration. Multidetector CT imaging of the abdomen and pelvis was performed following the standard protocol before and during bolus administration of intravenous contrast. CONTRAST:  154mL OMNIPAQUE IOHEXOL 300 MG/ML  SOLN COMPARISON:  Noncontrast AP CT on 09/26/2018, and chest CT on 05/03/2018 FINDINGS: CT CHEST FINDINGS Cardiovascular: No acute findings. Aortic and coronary artery atherosclerosis. Mediastinum/Nodes: No masses or pathologically enlarged lymph nodes identified on this unenhanced exam. Lungs/Pleura: Moderate bilateral pleural effusions. Compressive atelectasis of both lung bases. Airspace disease with central air bronchograms seen in the left upper lobe, suspicious for pneumonia. Central tracheobronchial airways remain patent. Musculoskeletal:  No suspicious bone lesions. CT ABDOMEN AND PELVIS FINDINGS Hepatobiliary: Hepatic cirrhosis again demonstrated. No liver masses identified. Prior cholecystectomy. No evidence of biliary obstruction. Pancreas: Solid mass is seen involving the pancreatic head which measures 4.4 x 4.0 cm, which shows arterial phase hyperenhancement. 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 No evidence of pancreatic ductal dilatation. Spleen: Moderate splenomegaly, consistent with portal venous hypertension. Adrenals/Urinary Tract: Absence of left kidney again seen. A large solid mass showing arterial phase hyperenhancement is seen involving the mid and lower pole of the right kidney. This measures 10.7 x 9.4 cm, consistent with renal cell  carcinoma. This appears to be contained within Gerota's fascia. No evidence of hydronephrosis. No evidence of renal vein or IVC tumor thrombus. Stomach/Bowel: No evidence of obstruction, inflammatory process or abnormal fluid collections. Surgical mesh again seen in anterior abdominal wall. A small left abdominal ventral hernia is again seen containing a loop of distal transverse colon, and is stable. Vascular/Lymphatic: No pathologically enlarged lymph nodes. No abdominal aortic aneurysm. Aortic atherosclerosis. Reproductive: Prior hysterectomy noted. Adnexal regions are unremarkable in appearance. Other: Mild ascites and diffuse mesenteric and body wall edema show no significant change. Musculoskeletal:  No suspicious bone lesions identified. IMPRESSION: 1. 10.7 cm solid mass involving the mid and lower pole of right kidney, consistent with renal cell carcinoma. No evidence of renal vein or IVC tumor thrombus. 2. 4.4 cm hypervascular mass in the pancreatic head. This favors hypervascular metastasis or pancreatic neuroendocrine tumor over pancreatic adenocarcinoma. 3. Moderate bilateral pleural effusions and compressive atelectasis. Airspace disease with central air bronchograms in left upper lobe, suspicious for pneumonia. 4. Hepatic cirrhosis and findings of portal venous hypertension. No radiographic evidence of hepatic neoplasm. 5. Mild ascites, and diffuse mesenteric and body wall edema. 6. Stable small left abdominal ventral hernia containing loop of distal transverse colon. Electronically Signed   By: Earle Gell M.D.   On: 09/27/2018 10:03   US Renal  Result Date: 09/04/2018 CLINICAL DATA:  Acute kidney injury. History of left nephrectomy, chronic kidney disease stage 3 and bladder tack procedure. EXAM: RENAL / URINARY TRACT ULTRASOUND COMPLETE COMPARISON:  06/21/2016 renal sonogram. FINDINGS: Right Kidney: Renal measurements: 16.7 x 6.7 x 5.2 cm = volume: 302 mL. Compensatory hypertrophy of the right  kidney. Renal parenchymal thickness and echogenicity within normal limits. No hydronephrosis. No renal mass. Left Kidney: Surgically absent. No mass or fluid collection demonstrated in the left nephrectomy  bed. Bladder: Limited visualization of the nondistended grossly normal bladder. IMPRESSION: 1. Compensatory hypertrophy of the otherwise normal right kidney. No right hydronephrosis. 2. Left nephrectomy. 3. Limited visualization of the nondistended normal appearing bladder. Electronically Signed   By: Ilona Sorrel M.D.   On: 09/04/2018 08:37   Dg Chest Port 1 View  Result Date: 09/26/2018 CLINICAL DATA:  Shortness of breath EXAM: PORTABLE CHEST 1 VIEW COMPARISON:  09/11/2018 FINDINGS: Cardiomegaly with vascular congestion and bilateral airspace opacities concerning for edema/CHF. Layering bilateral effusions. No acute bony abnormality. IMPRESSION: Moderate CHF.  Layering bilateral effusions. Electronically Signed   By: Rolm Baptise M.D.   On: 09/19/2018 22:38   Dg Chest Port 1 View  Result Date: 09/11/2018 CLINICAL DATA:  63 year old female with PICC placement EXAM: PORTABLE CHEST 1 VIEW COMPARISON:  09/11/2018 FINDINGS: Clavicles are midline on the current chest x-ray. The tip of the PICC terminates just to the left of midline. Left subclavian central venous catheter again terminates in the region of the brachiocephalic vein. Hazy opacities at the bilateral lung bases are unchanged with blunting of the bilateral costophrenic angles and obscuration of the bilateral hemidiaphragm. No pneumothorax. IMPRESSION: The right upper extremity PICC again terminates with the tip terminating over the midline. The plain film is indeterminate for specifically locating the tip. If there is any further concern, correlation with blood gas or pressure transduction may be useful. Similar appearance of bilateral pleural effusions and associated atelectasis/consolidation Unchanged left subclavian central line Electronically  Signed   By: Corrie Mckusick D.O.   On: 09/11/2018 20:42   Dg Chest Port 1 View  Result Date: 09/11/2018 CLINICAL DATA:  63 year old female with central line placement EXAM: PORTABLE CHEST 1 VIEW COMPARISON:  09/09/2018, 09/08/2018 FINDINGS: Cardiomediastinal silhouette unchanged in size and contour with cardiomegaly. Interval placement of right upper extremity PICC, with the tip terminating on the left aspect of the spine. There is left rotation the patient. Unchanged position of left subclavian central venous catheter with the tip appearing to terminate in the brachiocephalic vein. Hazy opacities of the bilateral lung bases with obscuration of the retrocardiac region of the bilateral cardiophrenic angles. No new airspace opacity. IMPRESSION: Interval placement of right upper extremity PICC. The right rotation of the chest x-ray limits evaluation of the tip of the catheter, which terminates over the left aspect of the spine. While this most likely is projectional, arterial placement cannot be excluded on this plain film. If there is any concern for the location, correlation with blood gas may be useful, or repeat plain film. Unchanged left subclavian central venous catheter. Similar appearance of pleural effusions with associated atelectasis/consolidation. Electronically Signed   By: Corrie Mckusick D.O.   On: 09/11/2018 19:25   Dg Chest Port 1 View  Result Date: 09/09/2018 CLINICAL DATA:  Pulmonary edema. EXAM: PORTABLE CHEST 1 VIEW COMPARISON:  September 08, 2018 FINDINGS: The left central line is been pulled back slightly in the interval, terminating just proximal to the brachiocephalic confluence. No pneumothorax. Bilateral pleural effusions with underlying opacity. Decreased pulmonary edema. IMPRESSION: 1. Left central line is been pulled back slightly in the interval, likely terminating just proximal to the brachiocephalic confluence. Removal of ET and NG tubes. 2. Bilateral pleural effusions with underlying  opacities, likely atelectasis. Electronically Signed   By: Dorise Bullion III M.D   On: 09/09/2018 05:28   Dg Chest Port 1 View  Result Date: 09/08/2018 CLINICAL DATA:  63 year old female with respiratory failure. Negative for COVID-19 earlier this  month. EXAM: PORTABLE CHEST 1 VIEW COMPARISON:  09/06/2018 and earlier. FINDINGS: Portable AP semi upright view at 0441 hours. Stable endotracheal tube. Enteric tube has been placed and courses to the abdomen, tip not included. Stable left subclavian central line. Stable cardiomegaly and mediastinal contours. Continued dense lung base opacity greater on the right. Mildly increased veiling opacity in the lower lungs bilaterally. No pneumothorax. Upper lung pulmonary vascularity appears mildly increased. IMPRESSION: 1. Enteric tube placed and courses to the abdomen, tip not included. 2. Otherwise stable lines and tubes. 3. Continued lower lobe collapse or consolidation with suspicion of increased bilateral pleural effusions. 4. Mildly increased pulmonary vascularity, consider mild interstitial edema. Electronically Signed   By: Genevie Ann M.D.   On: 09/08/2018 07:42   Dg Chest Port 1 View  Result Date: 09/06/2018 CLINICAL DATA:  Acute respiratory distress EXAM: PORTABLE CHEST 1 VIEW COMPARISON:  08/30/2018, 08/28/2018, 07/14/2018 FINDINGS: Endotracheal tube tip is about 18 mm superior to the carina. Left-sided central venous catheter tip over the brachiocephalic region. Removal of esophageal tube. Continued bilateral pleural effusions and left greater than right basilar consolidations. Enlarged cardiomediastinal silhouette with vascular congestion. No pneumothorax. IMPRESSION: 1. Removal of esophageal tube. 2. Overall no significant interval change in cardiomegaly with vascular congestion Scala bilateral pleural effusions and left greater than right basilar airspace disease. Electronically Signed   By: Donavan Foil M.D.   On: 09/06/2018 23:45   Dg Chest Port 1  View  Result Date: 08/30/2018 CLINICAL DATA:  Check endotracheal tube placement EXAM: PORTABLE CHEST 1 VIEW COMPARISON:  Film from earlier in the same day. FINDINGS: Endotracheal tube, gastric catheter and left subclavian central line are noted in satisfactory position. No pneumothorax is seen. Left basilar infiltrate with associated effusion is noted. Right-sided effusion is seen as well with mild basilar infiltrate/atelectasis. No bony abnormality is noted. IMPRESSION: Tubes and lines as described above. Electronically Signed   By: Inez Catalina M.D.   On: 08/30/2018 20:34   Dg Chest Port 1 View  Result Date: 08/30/2018 CLINICAL DATA:  COPD. EXAM: PORTABLE CHEST 1 VIEW COMPARISON:  August 28, 2018 FINDINGS: The focal infiltrate in the right base has improved. Left retrocardiac opacity remains. No other changes. IMPRESSION: Improving focal opacity in the right base. Stable to mildly improved left retrocardiac opacity. Electronically Signed   By: Dorise Bullion III M.D   On: 08/30/2018 09:19   Dg Abd Portable 1v  Result Date: 08/30/2018 CLINICAL DATA:  Check gastric catheter placement EXAM: PORTABLE ABDOMEN - 1 VIEW COMPARISON:  None. FINDINGS: Gastric catheter is noted extending into the mid stomach. No obstructive changes are seen. No free air is noted. Bibasilar changes are noted left greater than right similar to that seen on prior chest x-ray. IMPRESSION: Gastric catheter within the stomach. Electronically Signed   By: Inez Catalina M.D.   On: 08/30/2018 20:35   [place] Ekg Site Rite  Result Date: 09/11/2018 If Site Rite image not attached, placement could not be confirmed due to current cardiac rhythm.   Labs:  CBC: Recent Labs    09/26/18 0539 09/27/18 0429 09/27/18 0724 09/28/18 0420  WBC 1.8* 5.8 6.4 8.1  HGB 7.8* 8.4* 8.5* 8.7*  HCT 26.6* 29.4* 30.1* 31.5*  PLT 74* 83* 83* 101*    COAGS: Recent Labs    10/10/2018 2157 09/27/18 1634  INR 1.3* 1.1    BMP: Recent Labs     09/25/18 0821 09/26/18 0539 09/26/18 1603 09/27/18 0429  NA 135 136  136 139  K 2.8* 2.6* 3.0* 3.3*  CL 80* 75* 82* 81*  CO2 42* 44* 48* 45*  GLUCOSE 95 104* 152* 100*  BUN 79* 73* 68* 64*  CALCIUM 8.3* 8.6* 8.5* 8.8*  CREATININE 1.30* 1.17* 1.22* 1.19*  GFRNONAA 43* 49* 46* 48*  GFRAA 50* 57* 54* 55*    LIVER FUNCTION TESTS: Recent Labs    09/24/18 0445 09/25/18 0821 09/26/18 0539 09/27/18 0429  BILITOT 0.7 0.7 0.7 1.0  AST 26 23 16 17   ALT 48* 34 28 24  ALKPHOS 143* 129* 115 107  PROT 5.4* 5.0* 5.0* 5.4*  ALBUMIN 3.0* 3.0* 2.9* 2.9*    TUMOR MARKERS: No results for input(s): AFPTM, CEA, CA199, CHROMGRNA in the last 8760 hours.  Assessment and Plan:  Pancreatic mass and rt renal mass Need tissue diagnosis Now scheduled for biopsy of renal mass Risks and benefits of right renal mass was discussed with the patient and/or patient's family including, but not limited to bleeding, infection, damage to adjacent structures or low yield requiring additional tests.  All of the questions were answered and there is agreement to proceed. Consent signed and in chart.   Thank you for this interesting consult.  I greatly enjoyed meeting KHARISMA GLASNER and look forward to participating in their care.  A copy of this report was sent to the requesting provider on this date.  Electronically Signed: Lavonia Drafts, PA-C 09/28/2018, 8:44 AM   I spent a total of 40 Minutes    in face to face in clinical consultation, greater than 50% of which was counseling/coordinating care for right renal mass biopsy

## 2018-09-28 NOTE — Progress Notes (Signed)
Heard pt from down the hall yelling "I can't breathe." Resp easy and nonlabored. Placed [place] oxygen back in nares. Oxygen at 4 L/M [place]. Lungs sounds unchanged from previous assessment. Skin cool to touch so finger stick done to check on sugar level which results were 115. Oxygen sat on 4 L/M [place] 100%. Pt back to sleep before assessment was complete.

## 2018-09-28 NOTE — Procedures (Signed)
Pre procedural Dx: Right renal mass  Post procedural Dx: Same  Technically successful CT guided biopsy of right renal mass   EBL: None.   Complications: None immediate.   Ronny Bacon, MD Pager #: [phone]

## 2018-09-28 NOTE — Care Management Important Message (Signed)
Important Message  Patient Details  Name: Renee Noble MRN: [id number] Date of Birth: 08/09/1955   Medicare Important Message Given:  Yes    Zenon Mayo, RN 09/28/2018, 3:30 PM

## 2018-09-28 NOTE — Progress Notes (Signed)
Renee Noble  PROGRESS NOTE    Renee Noble  JXB:[id number] DOB: December 31, 1955 DOA: 10/01/2018 PCP: [name], PA-C   Brief Narrative:   63 year female with extensive history significant for but not limited tochronic hypoxic respiratory failure on 3 L oxygen at home, COPD, OHS,OSA, chronic diastolic CHF,PAF - not on AC given chronic thrombocytopenia/ recent anemia,diabetes type 2,andobesitypresenting from re[facility] for altered [facility] status and hyperkalemia.   Recent hospitalization 4/13- 5/2 for acute on chronic respiratory failure requiring intubation twice and acute on chronic heart failure with noncompliance in wearing CPAP and fluid restriction; was COVID negative twice.  She was admitted for acute on chronic hypoxic and hypercapnic respiratory failure as well as hyperkalemia. She was initially admitted to the ICU on bipap, does not appear she was ever intubated.    Assessment & Plan:   Active Problems:   Acute on chronic respiratory failure with hypoxia and hypercapnia (HCC)   Respiratory failure with hypercapnia (HCC)   Hyperkalemia   Acute on chronic hypoxic/hypercapnic respiratory failure. Severe COPD. OSA/OHS.     - dulera, prn abluterol/duoneb     - oxygen to keep SpO2 88 to 95%     - Bipap qhs and prn     - caution with opiates and sedating medications     - says her breathing is "doing what it is doing"  HFpEF Exacerbation:     - Echo with normal EF. RV with severely reduced systolic function. (see report)      - BNP ~1090.4     - CXR on 5/6 with evidence of HF     - Lasix 80 IV BID now, needs continued diuresis.  Nephrology has now signed off.     - I/O, daily weights -> she notes her baseline weight is ~273.  Weight 5/11 charted at 299.61 lbs.  Bed weights have been inaccurate.     - CT scan notable for bilateral pleural effusions with compressive atelectasis; Suspect this is 2/2 overload.       - Continue diuresis, lower suspicion for  pneumonia.  Pt afebrile.   Large Right Renal Inferior Pole Mass   Rounded Mass in the region of the head of the pancreas:      - Renal inferior pole mass concerning for malignancy with suggestion of extension of tumor thrombus into R renal vein and possibly IVC     - Also round mass in region of head of pancreas, possibly primary pancreatic neoplasm vs metastatic disease     - Appreciate oncology recommendations     - GI consulted; poor candidate for scope      - IR consulted for possible percutaneous biopsy; appreciate assistance     - Pt did not tolerate MRI, attempt to repeat?     - currently refusing to repeat MRI     - now s/p CT guided Bx of renal mass  Pancytopenia     - s/p granix; monitor CBC  Permanent atrial fibrillation.     - Not on anticoagulation due to chronic thrombocytopenia     - Cardizem     - HR ok, monitor  Hypokalemia:      - severe, replace, follow  Acute Kidney Injury:      - Baseline creatinine is <1     - Improving with diuresis (peaked at 1.67)     - Renal following, appreciate recs - now signed off  Hx of HLD     -  pravachol  RUL pulmonary nodule.     - f/u CT chest in July 2020 as outpt  DM type II.     - SSI with levemir  Hypothyroidism:     - synthroid  Anemia, thrombocytopenia of chronic disease and iron deficiency.     - f/u CBC, stable     - s/p IV iron per renal     - s/p 1 unit pRBC     - stable  Leukopenia     - resolved, s/p granix  Acute metabolic encephalopathy     - resolved  Hx of depression, chronic pain     - scheduled APAP, voltaren, lidocaine patch, nortriptyline       - PRN oxycodone  Deconditioning.     - PT/OT     - recommending SNF  Refusing MRI d/t claustrophobia. Now s/p renal Bx. Await results. Stable Hgb. Respiratory status stable. Continue as above. Appreciate all consultants assistance.    DVT prophylaxis: SCDs Code Status: FULL Family Communication: None   Disposition Plan:  TBD   Consultants:   PCCM  Nephrology  GI  IR  Procedures:   Renal Bx   Subjective: "I'm doing that already."  Objective: Vitals:   09/28/18 1030 09/28/18 1045 09/28/18 1100 09/28/18 1119  BP: 112/62 110/64 114/60 129/64  Pulse:    93  Resp:    13  Temp: 98.1 F (36.7 C)   98 F (36.7 C)  TempSrc: Axillary   Oral  SpO2:    90%  Weight:      Height:        Intake/Output Summary (Last 24 hours) at 09/28/2018 1416 Last data filed at 09/28/2018 0900 Gross per 24 hour  Intake 300 ml  Output 900 ml  Net -600 ml   Filed Weights   09/25/18 1312 09/26/18 0300 09/28/18 0435  Weight: 135.9 kg 94.8 kg (!) 136.8 kg    Examination:  General: 63 y.o. female resting in bed in NAD Cardiovascular: RRR, +S1, S2, no m/g/r, equal pulses throughout Respiratory: decreased at bases, upper airway transmission, shallow breathing GI: BS+, NDNT, no masses noted, no organomegaly noted MSK: No e/c/c Skin: No rashes, bruises, ulcerations noted Neuro: A&O x 3, no focal deficits    Data Reviewed: I have personally reviewed following labs and imaging studies.  CBC: Recent Labs  Lab 09/25/18 1302 09/26/18 0539 09/27/18 0429 09/27/18 0724 09/28/18 0420  WBC 1.6* 1.8* 5.8 6.4 8.1  NEUTROABS 1.2* 1.4* 4.9 5.5 7.0  HGB 7.7* 7.8* 8.4* 8.5* 8.7*  HCT 26.5* 26.6* 29.4* 30.1* 31.5*  MCV 84.4 85.5 86.7 86.7 90.5  PLT 71* 74* 83* 83* 400*   Basic Metabolic Panel: Recent Labs  Lab 09/22/18 0336  09/24/18 0445 09/25/18 0821 09/26/18 0539 09/26/18 1603 09/27/18 0429  NA 138   < > 135 135 136 136 139  K 5.4*   < > 4.3 2.8* 2.6* 3.0* 3.3*  CL 83*   < > 83* 80* 75* 82* 81*  CO2 42*   < > 44* 42* 44* 48* 45*  GLUCOSE 187*   < > 124* 95 104* 152* 100*  BUN 78*   < > 85* 79* 73* 68* 64*  CREATININE 1.51*   < > 1.44* 1.30* 1.17* 1.22* 1.19*  CALCIUM 8.9   < > 8.3* 8.3* 8.6* 8.5* 8.8*  MG 2.0  --  2.0  --  1.6*  --  1.8  PHOS 5.7*  --   --   --   --   --   --    < > =  values in  this interval not displayed.   GFR: Estimated Creatinine Clearance: 65.1 mL/min (A) (by C-G formula based on SCr of 1.19 mg/dL (H)). Liver Function Tests: Recent Labs  Lab 09/22/18 0336 09/24/18 0445 09/25/18 0821 09/26/18 0539 09/27/18 0429  AST 62* 26 23 16 17   ALT 84* 48* 34 28 24  ALKPHOS 181* 143* 129* 115 107  BILITOT 0.6 0.7 0.7 0.7 1.0  PROT 5.5* 5.4* 5.0* 5.0* 5.4*  ALBUMIN 3.0* 3.0* 3.0* 2.9* 2.9*   No results for input(s): LIPASE, AMYLASE in the last 168 hours. No results for input(s): AMMONIA in the last 168 hours. Coagulation Profile: Recent Labs  Lab 09/27/18 1634  INR 1.1   Cardiac Enzymes: Recent Labs  Lab 09/21/18 2159  CKTOTAL 9*   BNP (last 3 results) No results for input(s): PROBNP in the last 8760 hours. HbA1C: No results for input(s): HGBA1C in the last 72 hours. CBG: Recent Labs  Lab 09/27/18 1613 09/27/18 2115 09/28/18 0407 09/28/18 0800 09/28/18 1117  GLUCAP 99 130* 115* 95 135*   Lipid Profile: No results for input(s): CHOL, HDL, LDLCALC, TRIG, CHOLHDL, LDLDIRECT in the last 72 hours. Thyroid Function Tests: No results for input(s): TSH, T4TOTAL, FREET4, T3FREE, THYROIDAB in the last 72 hours. Anemia Panel: No results for input(s): VITAMINB12, FOLATE, FERRITIN, TIBC, IRON, RETICCTPCT in the last 72 hours. Sepsis Labs: No results for input(s): PROCALCITON, LATICACIDVEN in the last 168 hours.  Recent Results (from the past 240 hour(s))  Culture, blood (routine x 2)     Status: None   Collection Time: 09/27/2018  9:50 PM  Result Value Ref Range Status   Specimen Description BLOOD LEFT HAND  Final   Special Requests   Final    BOTTLES DRAWN AEROBIC AND ANAEROBIC Blood Culture adequate volume   Culture   Final    NO GROWTH 5 DAYS Performed at [facility], [address] [place], [place] [address]    Report Status 09/25/2018 FINAL  Final  SARS Coronavirus 2 (CEPHEID- Performed in [place] hospital lab), Hosp Order      Status: None   Collection Time: 10/12/2018  9:54 PM  Result Value Ref Range Status   SARS Coronavirus 2 NEGATIVE NEGATIVE Final    Comment: (NOTE) If result is NEGATIVE SARS-CoV-2 target nucleic acids are NOT DETECTED. The SARS-CoV-2 RNA is generally detectable in upper and lower  respiratory specimens during the acute phase of infection. The lowest  concentration of SARS-CoV-2 viral copies this assay can detect is 250  copies / mL. A negative result does not preclude SARS-CoV-2 infection  and should not be used as the sole basis for treatment or other  patient management decisions.  A negative result may occur with  improper specimen collection / handling, submission of specimen other  than nasopharyngeal swab, presence of viral mutation(s) within the  areas targeted by this assay, and inadequate number of viral copies  (<250 copies / mL). A negative result must be combined with clinical  observations, patient history, and epidemiological information. If result is POSITIVE SARS-CoV-2 target nucleic acids are DETECTED. The SARS-CoV-2 RNA is generally detectable in upper and lower  respiratory specimens dur ing the acute phase of infection.  Positive  results are indicative of active infection with SARS-CoV-2.  Clinical  correlation with patient history and other diagnostic information is  necessary to determine patient infection status.  Positive results do  not rule out bacterial infection or co-infection with other viruses. If  result is PRESUMPTIVE POSTIVE SARS-CoV-2 nucleic acids MAY BE PRESENT.   A presumptive positive result was obtained on the submitted specimen  and confirmed on repeat testing.  While 2019 novel coronavirus  (SARS-CoV-2) nucleic acids may be present in the submitted sample  additional confirmatory testing may be necessary for epidemiological  and / or clinical management purposes  to differentiate between  SARS-CoV-2 and other Sarbecovirus currently known to  infect humans.  If clinically indicated additional testing with an alternate test  methodology [phone]) is advised. The SARS-CoV-2 RNA is generally  detectable in upper and lower respiratory sp ecimens during the acute  phase of infection. The expected result is Negative. Fact Sheet for Patients:  StrictlyIdeas.no Fact Sheet for Healthcare Providers: BankingDealers.co.za This test is not yet approved or cleared by the [place] FDA and has been authorized for detection and/or diagnosis of SARS-CoV-2 by FDA under an Emergency Use Authorization (EUA).  This EUA will remain in effect (meaning this test can be used) for the duration of the COVID-19 declaration under Section 564(b)(1) of the Act, 21 U.S.C. section 360bbb-3(b)(1), unless the authorization is terminated or revoked sooner. Performed at [facility], [place] [address], [place], [place] [address]   Culture, blood (routine x 2)     Status: Abnormal   Collection Time: 10/14/2018 10:05 PM  Result Value Ref Range Status   Specimen Description BLOOD LEFT ARM  Final   Special Requests   Final    BOTTLES DRAWN AEROBIC AND ANAEROBIC Blood Culture results may not be optimal due to an excessive volume of blood received in culture bottles   Culture  Setup Time   Final    AEROBIC BOTTLE ONLY GRAM POSITIVE COCCI CRITICAL RESULT CALLED TO, READ BACK BY AND VERIFIED WITH: PHARMD J LEDFORD [id number] AT 30 AM BY CM    Culture (A)  Final    STAPHYLOCOCCUS SPECIES (COAGULASE NEGATIVE) THE SIGNIFICANCE OF ISOLATING THIS ORGANISM FROM A SINGLE SET OF BLOOD CULTURES WHEN MULTIPLE SETS ARE DRAWN IS UNCERTAIN. PLEASE NOTIFY THE MICROBIOLOGY DEPARTMENT WITHIN ONE WEEK IF SPECIATION AND SENSITIVITIES ARE REQUIRED. Performed at [facility], [place] [address], [place], [place] [address]    Report Status 09/23/2018 FINAL  Final  Blood Culture ID Panel (Reflexed)     Status: Abnormal   Collection  Time: 10/06/2018 10:05 PM  Result Value Ref Range Status   Enterococcus species NOT DETECTED NOT DETECTED Final   Listeria monocytogenes NOT DETECTED NOT DETECTED Final   Staphylococcus species DETECTED (A) NOT DETECTED Final    Comment: Methicillin (oxacillin) resistant coagulase negative staphylococcus. Possible blood culture contaminant (unless isolated from more than one blood culture draw or clinical case suggests pathogenicity). No antibiotic treatment is indicated for blood  culture contaminants. CRITICAL RESULT CALLED TO, READ BACK BY AND VERIFIED WITH: PHARMD J LEDFORD [id number] AT 81 AM BY CM    Staphylococcus aureus (BCID) NOT DETECTED NOT DETECTED Final   Methicillin resistance DETECTED (A) NOT DETECTED Final    Comment: CRITICAL RESULT CALLED TO, READ BACK BY AND VERIFIED WITH: PHARMD J LEDFORD [id number] AT 649 AM BY CM    Streptococcus species NOT DETECTED NOT DETECTED Final   Streptococcus agalactiae NOT DETECTED NOT DETECTED Final   Streptococcus pneumoniae NOT DETECTED NOT DETECTED Final   Streptococcus pyogenes NOT DETECTED NOT DETECTED Final   Acinetobacter baumannii NOT DETECTED NOT DETECTED Final   Enterobacteriaceae species NOT DETECTED NOT DETECTED Final   Enterobacter cloacae complex NOT DETECTED  NOT DETECTED Final   Escherichia coli NOT DETECTED NOT DETECTED Final   Klebsiella oxytoca NOT DETECTED NOT DETECTED Final   Klebsiella pneumoniae NOT DETECTED NOT DETECTED Final   Proteus species NOT DETECTED NOT DETECTED Final   Serratia marcescens NOT DETECTED NOT DETECTED Final   Haemophilus influenzae NOT DETECTED NOT DETECTED Final   Neisseria meningitidis NOT DETECTED NOT DETECTED Final   Pseudomonas aeruginosa NOT DETECTED NOT DETECTED Final   Candida albicans NOT DETECTED NOT DETECTED Final   Candida glabrata NOT DETECTED NOT DETECTED Final   Candida krusei NOT DETECTED NOT DETECTED Final   Candida parapsilosis NOT DETECTED NOT DETECTED Final   Candida tropicalis  NOT DETECTED NOT DETECTED Final    Comment: Performed at [facility], [place] [address], [place], [place] [address]  MRSA PCR Screening     Status: None   Collection Time: 09/21/18  2:34 AM  Result Value Ref Range Status   MRSA by PCR NEGATIVE NEGATIVE Final    Comment:        The GeneXpert MRSA Assay (FDA approved for NASAL specimens only), is one component of a comprehensive MRSA colonization surveillance program. It is not intended to diagnose MRSA infection nor to guide or monitor treatment for MRSA infections. Performed at [facility], [place] [address] [place], [place] [address]          Radiology Studies: Ct Abdomen Pelvis W Wo Contrast  Result Date: 09/27/2018 CLINICAL DATA:  Right renal and pancreatic lesions on recent noncontrast CT. Cirrhosis. Bilateral pleural effusions. EXAM: CT CHEST WITHOUT CONTRAST CT ABDOMEN AND PELVIS WITH AND WITHOUT CONTRAST TECHNIQUE: Multidetector CT imaging of the chest was performed prior to intravenous contrast administration. Multidetector CT imaging of the abdomen and pelvis was performed following the standard protocol before and during bolus administration of intravenous contrast. CONTRAST:  129mL OMNIPAQUE IOHEXOL 300 MG/ML  SOLN COMPARISON:  Noncontrast AP CT on 09/26/2018, and chest CT on 05/03/2018 FINDINGS: CT CHEST FINDINGS Cardiovascular: No acute findings. Aortic and coronary artery atherosclerosis. Mediastinum/Nodes: No masses or pathologically enlarged lymph nodes identified on this unenhanced exam. Lungs/Pleura: Moderate bilateral pleural effusions. Compressive atelectasis of both lung bases. Airspace disease with central air bronchograms seen in the left upper lobe, suspicious for pneumonia. Central tracheobronchial airways remain patent. Musculoskeletal:  No suspicious bone lesions. CT ABDOMEN AND PELVIS FINDINGS Hepatobiliary: Hepatic cirrhosis again demonstrated. No liver masses identified. Prior cholecystectomy. No  evidence of biliary obstruction. Pancreas: Solid mass is seen involving the pancreatic head which measures 4.4 x 4.0 cm, which shows arterial phase hyperenhancement. 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 No evidence of pancreatic ductal dilatation. Spleen: Moderate splenomegaly, consistent with portal venous hypertension. Adrenals/Urinary Tract: Absence of left kidney again seen. A large solid mass showing arterial phase hyperenhancement is seen involving the mid and lower pole of the right kidney. This measures 10.7 x 9.4 cm, consistent with renal cell carcinoma. This appears to be contained within Gerota's fascia. No evidence of hydronephrosis. No evidence of renal vein or IVC tumor thrombus. Stomach/Bowel: No evidence of obstruction, inflammatory process or abnormal fluid collections. Surgical mesh again seen in anterior abdominal wall. A small left abdominal ventral hernia is again seen containing a loop of distal transverse colon, and is stable. Vascular/Lymphatic: No pathologically enlarged lymph nodes. No abdominal aortic aneurysm. Aortic atherosclerosis. Reproductive: Prior hysterectomy noted. Adnexal regions are unremarkable in appearance. Other: Mild ascites and diffuse mesenteric and body wall edema show no  significant change. Musculoskeletal:  No suspicious bone lesions identified. IMPRESSION: 1. 10.7 cm solid mass involving the mid and lower pole of right kidney, consistent with renal cell carcinoma. No evidence of renal vein or IVC tumor thrombus. 2. 4.4 cm hypervascular mass in the pancreatic head. This favors hypervascular metastasis or pancreatic neuroendocrine tumor over pancreatic adenocarcinoma. 3. Moderate bilateral pleural effusions and compressive atelectasis. Airspace disease with central air bronchograms in left upper lobe, suspicious for pneumonia. 4. Hepatic cirrhosis and findings of portal venous hypertension. No radiographic evidence of  hepatic neoplasm. 5. Mild ascites, and diffuse mesenteric and body wall edema. 6. Stable small left abdominal ventral hernia containing loop of distal transverse colon. Electronically Signed   By: Earle Gell M.D.   On: 09/27/2018 10:03   Ct Chest Wo Contrast  Result Date: 09/27/2018 CLINICAL DATA:  Right renal and pancreatic lesions on recent noncontrast CT. Cirrhosis. Bilateral pleural effusions. EXAM: CT CHEST WITHOUT CONTRAST CT ABDOMEN AND PELVIS WITH AND WITHOUT CONTRAST TECHNIQUE: Multidetector CT imaging of the chest was performed prior to intravenous contrast administration. Multidetector CT imaging of the abdomen and pelvis was performed following the standard protocol before and during bolus administration of intravenous contrast. CONTRAST:  183mL OMNIPAQUE IOHEXOL 300 MG/ML  SOLN COMPARISON:  Noncontrast AP CT on 09/26/2018, and chest CT on 05/03/2018 FINDINGS: CT CHEST FINDINGS Cardiovascular: No acute findings. Aortic and coronary artery atherosclerosis. Mediastinum/Nodes: No masses or pathologically enlarged lymph nodes identified on this unenhanced exam. Lungs/Pleura: Moderate bilateral pleural effusions. Compressive atelectasis of both lung bases. Airspace disease with central air bronchograms seen in the left upper lobe, suspicious for pneumonia. Central tracheobronchial airways remain patent. Musculoskeletal:  No suspicious bone lesions. CT ABDOMEN AND PELVIS FINDINGS Hepatobiliary: Hepatic cirrhosis again demonstrated. No liver masses identified. Prior cholecystectomy. No evidence of biliary obstruction. Pancreas: Solid mass is seen involving the pancreatic head which measures 4.4 x 4.0 cm, which shows arterial phase hyperenhancement. 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 No evidence of pancreatic ductal dilatation. Spleen: Moderate splenomegaly, consistent with portal venous hypertension. Adrenals/Urinary Tract: Absence of left kidney again  seen. A large solid mass showing arterial phase hyperenhancement is seen involving the mid and lower pole of the right kidney. This measures 10.7 x 9.4 cm, consistent with renal cell carcinoma. This appears to be contained within Gerota's fascia. No evidence of hydronephrosis. No evidence of renal vein or IVC tumor thrombus. Stomach/Bowel: No evidence of obstruction, inflammatory process or abnormal fluid collections. Surgical mesh again seen in anterior abdominal wall. A small left abdominal ventral hernia is again seen containing a loop of distal transverse colon, and is stable. Vascular/Lymphatic: No pathologically enlarged lymph nodes. No abdominal aortic aneurysm. Aortic atherosclerosis. Reproductive: Prior hysterectomy noted. Adnexal regions are unremarkable in appearance. Other: Mild ascites and diffuse mesenteric and body wall edema show no significant change. Musculoskeletal:  No suspicious bone lesions identified. IMPRESSION: 1. 10.7 cm solid mass involving the mid and lower pole of right kidney, consistent with renal cell carcinoma. No evidence of renal vein or IVC tumor thrombus. 2. 4.4 cm hypervascular mass in the pancreatic head. This favors hypervascular metastasis or pancreatic neuroendocrine tumor over pancreatic adenocarcinoma. 3. Moderate bilateral pleural effusions and compressive atelectasis. Airspace disease with central air bronchograms in left upper lobe, suspicious for pneumonia. 4. Hepatic cirrhosis and findings of portal venous hypertension. No radiographic evidence of hepatic neoplasm. 5. Mild ascites, and diffuse mesenteric and body wall edema. 6. Stable small left abdominal ventral  hernia containing loop of distal transverse colon. Electronically Signed   By: Earle Gell M.D.   On: 09/27/2018 10:03   Ct Biopsy  Result Date: 09/28/2018 INDICATION: Remote history of left-sided RCC, post nephrectomy, now with large right-sided renal mass worry for contralateral RCC. EXAM: CT BIOPSY  COMPARISON:  CT the chest, abdomen pelvis-09/27/2018 MEDICATIONS: Zofran 4 mg IV ANESTHESIA/SEDATION: Versed 0.5 mg IV Sedation time: 22 minutes; The patient was continuously monitored during the procedure by the interventional radiology nurse under my direct supervision. CONTRAST:  None. COMPLICATIONS: None immediate. PROCEDURE: Informed consent was obtained from the patient following an explanation of the procedure, risks, benefits and alternatives. A time out was performed prior to the initiation of the procedure. The patient was positioned supine on the CT table and a limited CT was performed for procedural planning demonstrating unchanged size and appearance of the infiltrative mass involving the caudal aspect the right kidney with dominant component measuring 9.1 x 8.4 cm (image 51, series 2). The procedure was planned. The operative site was prepped and draped in the usual sterile fashion. Appropriate trajectory was confirmed with a 22 gauge spinal needle after the adjacent tissues were anesthetized with 1% Lidocaine with epinephrine. Under intermittent CT guidance, a 17 gauge coaxial needle was advanced into the peripheral aspect of the mass. Appropriate positioning was confirmed and 5 core needle biopsy samples were obtained with an 18 gauge core needle biopsy device. The co-axial needle was removed following the administration of a Gel-Foam slurry and superficial hemostasis was achieved with manual compression. A limited postprocedural CT was negative for hemorrhage or additional complication. A dressing was placed. The patient tolerated the procedure well without immediate postprocedural complication. IMPRESSION: Technically successful CT guided core needle biopsy of infiltrative mass involving the right kidney. Electronically Signed   By: Sandi Mariscal M.D.   On: 09/28/2018 10:45        Scheduled Meds:  acetaminophen  650 mg Oral Q6H   chlorhexidine  15 mL Mouth Rinse BID   diclofenac sodium   2 g Topical QID   diltiazem  180 mg Oral Daily   furosemide  80 mg Intravenous BID   gelatin adsorbable       insulin aspart  0-15 Units Subcutaneous TID WC   insulin aspart  0-5 Units Subcutaneous QHS   insulin detemir  10 Units Subcutaneous BID   levothyroxine  88 mcg Oral Q0600   lidocaine  1 patch Transdermal Q24H   lidocaine-EPINEPHrine       mouth rinse  15 mL Mouth Rinse q12n4p   midazolam       mometasone-formoterol  2 puff Inhalation BID   nortriptyline  25 mg Oral QHS   ondansetron       polyethylene glycol  17 g Oral BID   potassium chloride  40 mEq Oral TID   pravastatin  20 mg Oral Daily   [START ON 09/29/2018] senna-docusate  2 tablet Oral BID   Continuous Infusions:  sodium chloride 10 mL/hr at 09/22/18 1400     LOS: 8 days    Time spent: 25 minutes spent in the coordination of care today.     Jonnie Finner, DO [facility] Pager [phone]  If 7PM-7AM, please contact night-coverage [email or web address] Password TRH1 09/28/2018, 2:16 PM

## 2018-09-29 ENCOUNTER — Inpatient Hospital Stay: Payer: Self-pay

## 2018-09-29 ENCOUNTER — Inpatient Hospital Stay (HOSPITAL_COMMUNITY): Payer: Medicare Other

## 2018-09-29 DIAGNOSIS — I5033 Acute on chronic diastolic (congestive) heart failure: Secondary | ICD-10-CM

## 2018-09-29 DIAGNOSIS — N2889 Other specified disorders of kidney and ureter: Secondary | ICD-10-CM

## 2018-09-29 DIAGNOSIS — G9340 Encephalopathy, unspecified: Secondary | ICD-10-CM

## 2018-09-29 LAB — POCT I-STAT 7, (LYTES, BLD GAS, ICA,H+H)
Acid-Base Excess: 23 mmol/L — ABNORMAL HIGH (ref 0.0–2.0)
Acid-Base Excess: 25 mmol/L — ABNORMAL HIGH (ref 0.0–2.0)
Bicarbonate: 49.6 mmol/L — ABNORMAL HIGH (ref 20.0–28.0)
Bicarbonate: 50.3 mmol/L — ABNORMAL HIGH (ref 20.0–28.0)
Calcium, Ion: 1.15 mmol/L (ref 1.15–1.40)
Calcium, Ion: 1.16 mmol/L (ref 1.15–1.40)
HCT: 30 % — ABNORMAL LOW (ref 36.0–46.0)
HCT: 28 % — ABNORMAL LOW (ref 36.0–46.0)
Hemoglobin: 10.2 g/dL — ABNORMAL LOW (ref 12.0–15.0)
Hemoglobin: 9.5 g/dL — ABNORMAL LOW (ref 12.0–15.0)
O2 Saturation: 95 %
O2 Saturation: 99 %
Potassium: 5.6 mmol/L — ABNORMAL HIGH (ref 3.5–5.1)
Potassium: 4.9 mmol/L (ref 3.5–5.1)
Sodium: 134 mmol/L — ABNORMAL LOW (ref 135–145)
Sodium: 134 mmol/L — ABNORMAL LOW (ref 135–145)
TCO2: >50 mmol/L — ABNORMAL HIGH (ref 22–32)
TCO2: >50 mmol/L — ABNORMAL HIGH (ref 22–32)
pCO2 arterial: 62.6 mmHg — ABNORMAL HIGH (ref 32.0–48.0)
pCO2 arterial: 58.1 mmHg — ABNORMAL HIGH (ref 32.0–48.0)
pH, Arterial: 7.507 — ABNORMAL HIGH (ref 7.350–7.450)
pH, Arterial: 7.546 — ABNORMAL HIGH (ref 7.350–7.450)
pO2, Arterial: 74 mmHg — ABNORMAL LOW (ref 83.0–108.0)
pO2, Arterial: 133 mmHg — ABNORMAL HIGH (ref 83.0–108.0)

## 2018-09-29 LAB — CBC WITH DIFFERENTIAL/PLATELET
Abs Immature Granulocytes: 0.3 10*3/uL — ABNORMAL HIGH (ref 0.00–0.07)
Basophils Absolute: 0 10*3/uL (ref 0.0–0.1)
Basophils Relative: 0 %
Eosinophils Absolute: 0 10*3/uL (ref 0.0–0.5)
Eosinophils Relative: 0 %
HCT: 32.6 % — ABNORMAL LOW (ref 36.0–46.0)
Hemoglobin: 8.8 g/dL — ABNORMAL LOW (ref 12.0–15.0)
Lymphocytes Relative: 2 %
Lymphs Abs: 0.2 10*3/uL — ABNORMAL LOW (ref 0.7–4.0)
MCH: 24.9 pg — ABNORMAL LOW (ref 26.0–34.0)
MCHC: 27 g/dL — ABNORMAL LOW (ref 30.0–36.0)
MCV: 92.1 fL (ref 80.0–100.0)
Metamyelocytes Relative: 1 %
Monocytes Absolute: 0.2 10*3/uL (ref 0.1–1.0)
Monocytes Relative: 2 %
Myelocytes: 1 %
Neutro Abs: 8.8 10*3/uL — ABNORMAL HIGH (ref 1.7–7.7)
Neutrophils Relative %: 93 %
Platelets: 121 10*3/uL — ABNORMAL LOW (ref 150–400)
Promyelocytes Relative: 1 %
RBC: 3.54 MIL/uL — ABNORMAL LOW (ref 3.87–5.11)
RDW: 20 % — ABNORMAL HIGH (ref 11.5–15.5)
WBC: 9.5 10*3/uL (ref 4.0–10.5)
nRBC: 0 /100 WBC
nRBC: 0.4 % — ABNORMAL HIGH (ref 0.0–0.2)

## 2018-09-29 LAB — BLOOD GAS, ARTERIAL
Acid-Base Excess: 22.1 mmol/L — ABNORMAL HIGH (ref 0.0–2.0)
Acid-Base Excess: 23 mmol/L — ABNORMAL HIGH (ref 0.0–2.0)
Bicarbonate: 50.2 mmol/L — ABNORMAL HIGH (ref 20.0–28.0)
Bicarbonate: 49.5 mmol/L — ABNORMAL HIGH (ref 20.0–28.0)
Delivery systems: POSITIVE
Drawn by: 54887
Drawn by: 511851
Expiratory PAP: 8
FIO2: 100
FIO2: 100
Inspiratory PAP: 20
MECHVT: 430 mL
O2 Saturation: 88 %
PEEP: 10 cmH2O
Patient temperature: 98.6
Patient temperature: 98.6
RATE: 14 resp/min
pCO2 arterial: 81.7 mmHg (ref 32.0–48.0)
pH, Arterial: 7.243 — ABNORMAL LOW (ref 7.350–7.450)
pH, Arterial: 7.399 (ref 7.350–7.450)
pO2, Arterial: 59.2 mmHg — ABNORMAL LOW (ref 83.0–108.0)
pO2, Arterial: 310 mmHg — ABNORMAL HIGH (ref 83.0–108.0)

## 2018-09-29 LAB — BASIC METABOLIC PANEL
Anion gap: 12 (ref 5–15)
BUN: 67 mg/dL — ABNORMAL HIGH (ref 8–23)
CO2: 43 mmol/L — ABNORMAL HIGH (ref 22–32)
Calcium: 9.2 mg/dL (ref 8.9–10.3)
Chloride: 83 mmol/L — ABNORMAL LOW (ref 98–111)
Creatinine, Ser: 1.39 mg/dL — ABNORMAL HIGH (ref 0.44–1.00)
GFR calc Af Amer: 46 mL/min — ABNORMAL LOW (ref >60)
GFR calc non Af Amer: 40 mL/min — ABNORMAL LOW (ref >60)
Glucose, Bld: 92 mg/dL (ref 70–99)
Potassium: 5.3 mmol/L — ABNORMAL HIGH (ref 3.5–5.1)
Sodium: 138 mmol/L (ref 135–145)

## 2018-09-29 LAB — BRAIN NATRIURETIC PEPTIDE: B Natriuretic Peptide: 980.3 pg/mL — ABNORMAL HIGH (ref 0.0–100.0)

## 2018-09-29 LAB — RENAL FUNCTION PANEL
Albumin: 3.1 g/dL — ABNORMAL LOW (ref 3.5–5.0)
Anion gap: 8 (ref 5–15)
BUN: 64 mg/dL — ABNORMAL HIGH (ref 8–23)
CO2: 48 mmol/L — ABNORMAL HIGH (ref 22–32)
Calcium: 9.2 mg/dL (ref 8.9–10.3)
Chloride: 82 mmol/L — ABNORMAL LOW (ref 98–111)
Creatinine, Ser: 1.48 mg/dL — ABNORMAL HIGH (ref 0.44–1.00)
GFR calc Af Amer: 43 mL/min — ABNORMAL LOW (ref >60)
GFR calc non Af Amer: 37 mL/min — ABNORMAL LOW (ref >60)
Glucose, Bld: 128 mg/dL — ABNORMAL HIGH (ref 70–99)
Phosphorus: 4.8 mg/dL — ABNORMAL HIGH (ref 2.5–4.6)
Potassium: 5.2 mmol/L — ABNORMAL HIGH (ref 3.5–5.1)
Sodium: 138 mmol/L (ref 135–145)

## 2018-09-29 LAB — POCT ACTIVATED CLOTTING TIME: Activated Clotting Time: 0 seconds

## 2018-09-29 LAB — GLUCOSE, CAPILLARY
Glucose-Capillary: 209 mg/dL — ABNORMAL HIGH (ref 70–99)
Glucose-Capillary: 121 mg/dL — ABNORMAL HIGH (ref 70–99)
Glucose-Capillary: 91 mg/dL (ref 70–99)
Glucose-Capillary: 105 mg/dL — ABNORMAL HIGH (ref 70–99)
Glucose-Capillary: 114 mg/dL — ABNORMAL HIGH (ref 70–99)

## 2018-09-29 LAB — LACTATE DEHYDROGENASE: LDH: 114 U/L (ref 98–192)

## 2018-09-29 LAB — MAGNESIUM
Magnesium: 1.9 mg/dL (ref 1.7–2.4)
Magnesium: 2.2 mg/dL (ref 1.7–2.4)

## 2018-09-29 LAB — PHOSPHORUS: Phosphorus: 3 mg/dL (ref 2.5–4.6)

## 2018-09-29 MED ORDER — INSULIN ASPART 100 UNIT/ML ~~LOC~~ SOLN
3.0000 [IU] | SUBCUTANEOUS | Status: DC
  Administered 2018-09-29: 12:00:00 3 [IU] via SUBCUTANEOUS
  Administered 2018-09-30 (×5): 6 [IU] via SUBCUTANEOUS
  Administered 2018-09-30: 3 [IU] via SUBCUTANEOUS
  Administered 2018-10-01: 04:00:00 6 [IU] via SUBCUTANEOUS
  Administered 2018-10-01 – 2018-10-02 (×2): 3 [IU] via SUBCUTANEOUS
  Administered 2018-10-03: 6 [IU] via SUBCUTANEOUS

## 2018-09-29 MED ORDER — FAMOTIDINE 40 MG/5ML PO SUSR
20.0000 mg | Freq: Two times a day (BID) | ORAL | Status: DC
  Administered 2018-09-29 – 2018-10-05 (×12): 20 mg via ORAL
  Filled 2018-09-29 (×13): qty 2.5

## 2018-09-29 MED ORDER — DILTIAZEM HCL 60 MG PO TABS
60.0000 mg | ORAL_TABLET | Freq: Three times a day (TID) | ORAL | Status: DC
  Administered 2018-09-29 – 2018-10-03 (×8): 60 mg via ORAL
  Filled 2018-09-29 (×11): qty 1

## 2018-09-29 MED ORDER — CHLORHEXIDINE GLUCONATE CLOTH 2 % EX PADS
6.0000 | MEDICATED_PAD | Freq: Every day | CUTANEOUS | Status: DC
  Administered 2018-09-29 – 2018-10-02 (×4): 6 via TOPICAL

## 2018-09-29 MED ORDER — ETOMIDATE 2 MG/ML IV SOLN
20.0000 mg | Freq: Once | INTRAVENOUS | Status: AC
  Administered 2018-09-29: 11:00:00 20 mg via INTRAVENOUS

## 2018-09-29 MED ORDER — POTASSIUM CHLORIDE 20 MEQ/15ML (10%) PO SOLN
40.0000 meq | Freq: Three times a day (TID) | ORAL | Status: DC
  Administered 2018-09-29: 13:00:00 40 meq
  Filled 2018-09-29: qty 30

## 2018-09-29 MED ORDER — DEXMEDETOMIDINE HCL IN NACL 400 MCG/100ML IV SOLN
0.0000 ug/kg/h | INTRAVENOUS | Status: DC
  Administered 2018-09-29: 16:00:00 0.8 ug/kg/h via INTRAVENOUS
  Administered 2018-09-29: 1 ug/kg/h via INTRAVENOUS
  Administered 2018-09-29: 0.8 ug/kg/h via INTRAVENOUS
  Administered 2018-09-29: 11:00:00 0.4 ug/kg/h via INTRAVENOUS
  Administered 2018-09-30: 12:00:00 0.7 ug/kg/h via INTRAVENOUS
  Administered 2018-09-30: 22:00:00 0.6 ug/kg/h via INTRAVENOUS
  Administered 2018-09-30: 0.8 ug/kg/h via INTRAVENOUS
  Administered 2018-09-30: 0.6 ug/kg/h via INTRAVENOUS
  Administered 2018-09-30 (×2): 1 ug/kg/h via INTRAVENOUS
  Administered 2018-10-01: 06:00:00 0.3 ug/kg/h via INTRAVENOUS
  Administered 2018-10-01: 0.7 ug/kg/h via INTRAVENOUS
  Filled 2018-09-29 (×13): qty 100

## 2018-09-29 MED ORDER — CHLORHEXIDINE GLUCONATE 0.12% ORAL RINSE (MEDLINE KIT)
15.0000 mL | Freq: Two times a day (BID) | OROMUCOSAL | Status: DC
  Administered 2018-09-30 – 2018-10-04 (×8): 15 mL via OROMUCOSAL

## 2018-09-29 MED ORDER — MIDAZOLAM HCL 2 MG/2ML IJ SOLN: 1.0000 mg | INTRAMUSCULAR | Status: DC | PRN

## 2018-09-29 MED ORDER — SODIUM CHLORIDE 0.9% FLUSH: 10.0000 mL | INTRAVENOUS | Status: DC | PRN

## 2018-09-29 MED ORDER — MAGNESIUM SULFATE 2 GM/50ML IV SOLN
2.0000 g | Freq: Once | INTRAVENOUS | Status: AC
  Administered 2018-09-29: 2 g via INTRAVENOUS
  Filled 2018-09-29: qty 50

## 2018-09-29 MED ORDER — NOREPINEPHRINE 4 MG/250ML-% IV SOLN
INTRAVENOUS | Status: AC
  Filled 2018-09-29: qty 250

## 2018-09-29 MED ORDER — FENTANYL CITRATE (PF) 100 MCG/2ML IJ SOLN
100.0000 ug | Freq: Once | INTRAMUSCULAR | Status: AC
  Administered 2018-09-29: 100 ug via INTRAVENOUS

## 2018-09-29 MED ORDER — NOREPINEPHRINE 4 MG/250ML-% IV SOLN
0.0000 ug/min | INTRAVENOUS | Status: DC
  Administered 2018-09-29: 19:00:00 1 ug/min via INTRAVENOUS
  Administered 2018-09-29: 5 ug/min via INTRAVENOUS
  Administered 2018-09-30: 22:00:00 2 ug/min via INTRAVENOUS
  Administered 2018-10-01 (×2): 1 ug/min via INTRAVENOUS
  Filled 2018-09-29 (×2): qty 250

## 2018-09-29 MED ORDER — MIDAZOLAM HCL 2 MG/2ML IJ SOLN
INTRAMUSCULAR | Status: AC
  Filled 2018-09-29: qty 2

## 2018-09-29 MED ORDER — MIDAZOLAM HCL 2 MG/2ML IJ SOLN
2.0000 mg | Freq: Once | INTRAMUSCULAR | Status: AC
  Administered 2018-09-29: 11:00:00 2 mg via INTRAVENOUS

## 2018-09-29 MED ORDER — PRO-STAT SUGAR FREE PO LIQD
30.0000 mL | Freq: Two times a day (BID) | ORAL | Status: DC
  Administered 2018-09-29 – 2018-10-01 (×4): 30 mL
  Filled 2018-09-29 (×4): qty 30

## 2018-09-29 MED ORDER — ORAL CARE MOUTH RINSE
15.0000 mL | OROMUCOSAL | Status: DC
  Administered 2018-09-29 – 2018-10-01 (×17): 15 mL via OROMUCOSAL

## 2018-09-29 MED ORDER — VITAL HIGH PROTEIN PO LIQD
1000.0000 mL | ORAL | Status: DC
  Administered 2018-09-29 – 2018-09-30 (×2): 1000 mL

## 2018-09-29 MED ORDER — SODIUM CHLORIDE 0.9% FLUSH
10.0000 mL | Freq: Two times a day (BID) | INTRAVENOUS | Status: DC
  Administered 2018-09-29 – 2018-10-04 (×10): 10 mL

## 2018-09-29 MED ORDER — SUCCINYLCHOLINE CHLORIDE 20 MG/ML IJ SOLN
100.0000 mg | Freq: Once | INTRAMUSCULAR | Status: AC
  Administered 2018-09-29: 11:00:00 100 mg via INTRAVENOUS

## 2018-09-29 MED ORDER — POTASSIUM CHLORIDE 20 MEQ/15ML (10%) PO SOLN: 40.0000 meq | Freq: Two times a day (BID) | ORAL | Status: DC

## 2018-09-29 MED ORDER — FENTANYL CITRATE (PF) 100 MCG/2ML IJ SOLN
INTRAMUSCULAR | Status: AC
  Filled 2018-09-29: qty 2

## 2018-09-29 MED ORDER — FENTANYL CITRATE (PF) 100 MCG/2ML IJ SOLN
25.0000 ug | INTRAMUSCULAR | Status: DC | PRN
  Administered 2018-09-29: 100 ug via INTRAVENOUS
  Administered 2018-09-29 – 2018-09-30 (×2): 50 ug via INTRAVENOUS
  Filled 2018-09-29 (×3): qty 2

## 2018-09-29 MED ORDER — FENTANYL CITRATE (PF) 100 MCG/2ML IJ SOLN
25.0000 ug | INTRAMUSCULAR | Status: DC | PRN
  Administered 2018-09-29: 25 ug via INTRAVENOUS
  Filled 2018-09-29: qty 2

## 2018-09-29 NOTE — Progress Notes (Signed)
Brief GI progress note  I evaluated the patient earlier this morning around 745 and spoke with Dr. Marylyn Ishihara. Patient unable to be a part of conversation was noted to have low O2 sats while on BiPAP.  My plan had been to see her a couple hours later and discussed with her the goal of an EUS with possible FNB and understanding the risks of needing intubation for procedure. When I went to go and evaluate the patient had seen she got transferred to the MICU and was already intubated at that point in time. Reading Dr. Antonieta Pert note and I agree that overall clinical status continues to worsen. There was no doubt that if a EUS were to be considered she would need to be intubated as per my discussions with anesthesia. At this point would not pursue EUS without the patient understanding full well the risks/benefits of sampling as in the sense that if she got pancreatitis in her current state things could be devastating. At this point in time, we will sign off from a GI perspective. If her clinical status improves significantly the point where she is thought to be a candidate for oncologic care and please feel free to reach out to the inpatient GI service.  Justice Britain, MD [facility] Office # [phone]

## 2018-09-29 NOTE — Progress Notes (Signed)
Initial Nutrition Assessment  DOCUMENTATION CODES:   Morbid obesity  INTERVENTION:   Tube Feeding:  Vital High Protein at 50 ml/hr Pro-Stat 30 mL BID Provide 136 g of protein, 1400 kcals, 1008 mL of free water  Recommend discontinuing KCL  NUTRITION DIAGNOSIS:   Inadequate oral intake related to acute illness as evidenced by NPO status.  GOAL:   Provide needs based on ASPEN/SCCM guidelines  MONITOR:   TF tolerance, Vent status, Labs, Weight trends, Skin  REASON FOR ASSESSMENT:   Ventilator Enteral/tube feeding initiation and management  ASSESSMENT:   63 yo female admitted with acute on chronic respiratory failure with acute on chronic CHF, hyperkalemia, AKI on CKD III on 5/07. Recent hospitalization for the same.This admission, pt with newly identified mass of the head of the pancreas and right kidney mass.  Pt developed worsening respiratory failure with AMS requiring intubation on 5/15. PMH includes COPD, CHF, OSA, PAF, DM, CKD  5/7 Admit 5/13 CT abdomen with 10.7 cm solid mass right kidney, 4.4 cm pancreatic head mass, hepatic cirrhosis, mild ascites, diffuse mesenteric and body wall edema 5/14 Renal Biopsy performed 5/15 Intubated  Pt currently sedated on vent. Sedated on precedex, on levophed.  Unable to obtain diet and weight history. Weight relatively stable per weight encounters  Per oncology note today, pt likely with pancreatic cancer.  Potassium 5.3 but noted order for KCl. Recommend considering d/c  OG tube in stomach  Admission wt 136 kg; current wt 138.3 kg. Pt is net negative 11 L per I/O flow sheet  Labs: potassium 5.3 (H), Creatinine 1.39, BUN 67 Meds: lasix, ss novolog, levemir, lactulose, KCL    NUTRITION - FOCUSED PHYSICAL EXAM:  Unable to assess, working remotely  Diet Order:   Diet Order            Diet NPO time specified  Diet effective now              EDUCATION NEEDS:   Not appropriate for education at this  time  Skin:  Skin Assessment: Skin Integrity Issues: Skin Integrity Issues:: Stage II Stage II: buttock, bilateral IT  Last BM:  5/14  Height:   Ht Readings from Last 1 Encounters:  09/24/18 5\' 4"  (1.626 m)    Weight:   Wt Readings from Last 1 Encounters:  09/29/18 (!) 138.3 kg    Ideal Body Weight:  54.5 kg  BMI:  Body mass index is 52.34 kg/m.  Estimated Nutritional Needs:   Kcal:  3254-9826 kcals   Protein:  110-140 g  Fluid:  >/= 1.5 L   Kerman Passey MS, RD, LDN, CNSC [phone] Pager  [phone] Weekend/On-Call Pager

## 2018-09-29 NOTE — Progress Notes (Signed)
Peripherally Inserted Central Catheter/Midline Placement  The IV Nurse has discussed with the patient and/or persons authorized to consent for the patient, the purpose of this procedure and the potential benefits and risks involved with this procedure.  The benefits include less needle sticks, lab draws from the catheter, and the patient may be discharged home with the catheter. Risks include, but not limited to, infection, bleeding, blood clot (thrombus formation), and puncture of an artery; nerve damage and irregular heartbeat and possibility to perform a PICC exchange if needed/ordered by physician.  Alternatives to this procedure were also discussed.  Bard Power PICC patient education guide, fact sheet on infection prevention and patient information card has been provided to patient /or left at bedside.    PICC/Midline Placement Documentation        Darlyn Read 09/29/2018, 5:10 PM

## 2018-09-29 NOTE — Progress Notes (Signed)
Called for hypotension w/ BP 80s/60s, HR in 70s. Likely not tachycardic 2dary to b-blockade. Pt. Afebrile, nl WBC, more likely related to intravasc. Vol. Depletion (given lasix 80 mg earlier today for CHF), and sedation 2ndary to intubation.   Stopped sedation for now. BMP pending but trying to keep IVF nominal given CHF status. Low dose levophed for now, may try small fluid challenge.

## 2018-09-29 NOTE — Progress Notes (Signed)
Pt placed on bipap due to pt desatting. Pt currently on 20/8 70% satting 91% at this time. RN aware along with day shift RT.

## 2018-09-29 NOTE — Progress Notes (Signed)
Unfortunately, I think that the issues were going to have clearly boil down to her poor cardiopulmonary status.  She is yet to have the MRI of the abdomen.  I am unsure what the issue with this might be.  I think she did have the renal mass biopsy.  I doubt that she is going to have a endoscopic ultrasound for the pancreatic lesion.  It sounds like this is going to be incredibly complicated with respect to her cardiopulmonary status.  It sounds like she is going to have to be intubated for this.  I think that if she were to go on a ventilator, it would be very difficult for her to come off and I think this would certainly not help her quality of life.  As such, I probably would forego the endoscopic ultrasound of the pancreatic lesion.  Her CA 19-9 is elevated so I have to believe that this is indicative of pancreatic cancer.  Maybe the MRI will help [place] out better delineate the extent of this lesion.  The only thing that I would worry about is whether or not she would be considered for any type of local therapy (i.e. SBRT) without a tissue diagnosis.  Overall, I think the risks of a procedure far outweigh the benefits.  She just has a very marginal performance status.  Again this is all reflective of her significant cor pulmonale.  Her labs look are looking much better.  Her white cell count is 9.5.  Hemoglobin 8.8.  Platelet count 121,000.  Her BUN is 64 and the creatinine is 1.48.  We will see what the biopsy shows for the renal mass.  Unfortunately, I think there is very little that we will be able to do for this.  It is hard to talk to her as she is always sedated and somewhat lethargic.  Ultimately, we just may be left with not having to do anything for these likely malignancies.  Given her cardiac issues, her prognosis good easily be determined by her heart.  I do not think there is really a "right or wrong answer" as to how to approach her with respect to the likely malignancies that  she has.  I think we are really stuck because of her cardiopulmonary issues which really have no treatment itself.  I know that she is getting wonderful care by all the staff up on [address].  Lattie Haw, MD  Psalm 34:19

## 2018-09-29 NOTE — Progress Notes (Signed)
Received call from bedside RN. Pt was saturating within the 60s on 50% BIPAP. Turned 02 up to 100% to even sustain her saturations above 88. Pt it transferring to unit.

## 2018-09-29 NOTE — Progress Notes (Addendum)
NAME:  Renee Noble, MRN:  [id number], DOB:  [birth year], LOS: 0 ADMISSION DATE:  09/18/2018, CONSULTATION DATE:  09/21/2018 REFERRING MD:  Dr. Ralene Bathe, CHIEF COMPLAINT:  AMS  Brief History   63 yo female from SNF with altered mental status, acute on chronic hypercapnia with acidosis and hyperkalemia readmitted May 6.    .  Patient has been seen by oncology and gastroenterology.  Patient was treated with BiPAP support diuresis initially.  Transfer to hospitalist on May 9. .  Has been found to have a newly identified 4.7 cm mass of the head of the pancreas and a right kidney mass.  Per GI notes felt to be a poor candidate for endoscopy.  Plans for MRI MRCP.  Consulted by interventional radiology for right renal mass biopsy. 5/15 am with altered mental status , lethargic , PCCM consulted , patient required intubation .   Past Medical History  Severe COPD, chronic respiratory failure on 3 -4 liters oxygen, diastolic CHF, DM II, Hypothyroidism, OSA, Depression, Chronic pain, Hepatitis C , OHS  Cirrhosis and splenomegaly on CT abdomen 2016, chronic thrombocytopenia, previous nephrectomy, insulin-dependent diabetes  patient has hepatitis C treated with SOVALDI and ribavirin 2015.  Hospitalization April 13 through Sep 16, 2018 for acute on chronic respiratory failure requiring intubation x2.  Felt to be noncompliant of nocturnal CPAP.  Significant Hospital Events   4/13- 5/2 hospitalized 5/07 Admitted 5/08 transfer to progressive care  Consults:  Renal 5/07  GI 5/13 Oncology 5/11 IR 5/14   Procedures:  5/15 ETT   Significant Diagnostic Tests:  5/13 CT abd/pelvis -10.7 cm solid mass right kidney, 4.4 cm pancreatic head mass, moderate bilateral pleural effusions, hepatic cirrhosis, portal venous hypertension, mild ascites, diffuse mesenteric and body wall edema Moderate splenomegaly, no pathologically enlarged lymph nodes  Micro Data:  5/6 BC x 2 >>NEG x  5/6 BC -Staph species (coag neg )   5/6 COVID >> neg  Antimicrobials:    Interim history/subjective:  5/15- PCCM reconsulted for respiratory distress , altered mental status requiring intubation   Objective   BP (!) 114/93 (BP Location: Right Arm)   Pulse (!) 102   Temp 98.6 F (37 C) (Axillary)   Resp (!) 22   Ht 5\' 4"  (1.626 m)   Wt (!) 138.3 kg   SpO2 100%   BMI 52.34 kg/m   I/O last 3 completed shifts: In: 7 [P.O.:550] Out: 1201 [Urine:1200; Stool:1]  Examination:  General - morbidly obese female sedated on Vent  HENT : ETT in place Cardiac - ST, no mrg  Chest - diminished BS in bases , no wheezing , vent  Abdomen - large panus, BS + ,  Extremities - 1-2+ edema, stasis dermatatic changes  Skin - stasis dermatatic changes to LE  Neuro - sedated on vent   Resolved Hospital Problem list    Assessment & Plan:   Hyperkalemia. CKD 3. Hypomagnesium   Plan Repeat bmet  Tr scr /strict I/O  Replace Mg +     Acute on chronic hypoxic/hypercapnic respiratory failure.-? Pulmonary edema  Worsening with altered mental status on 5/15 requiring intubation  Severe COPD. OSA/OHS. RUL pulmonary nodule - CT chest 5/13 >bilateral effusion , aspdz w/ bronchograms LUL   Plan -full vent support  -assess for daily SBT/wean  -VAP  -wean fiO2 as able  - dulera, prn abluterol/duoneb -check abg in 1 hr  -CXR now  -hold on abx for now , tr temp/wbc   Permanent atrial fibrillation.  Acute on chronic diastolic CHF.-echo 5/7 EF 50-55% , decreased RV systolic fxn , RV severe dilation  Hx of HLD. Plan -diu -  - not candidate for oral anticoagulation reportedly due to chronic thrombocytopenia -diruesis as able  -cardizem  -metoprolol As needed   -SCD     DM type II. Hx of hypothyroidism. Plan - SSI  - continue synthroid  Anemia, thrombocytopenia of chronic disease and iron deficiency. Plan - f/u CBC    Acute metabolic encephalopathy. Hx of depression, chronic pain. Plan - sedation protocol  , precedex, fent , versed        Best practice:  Diet: TF  DVT prophylaxis: SCDs GI prophylaxis: Pepcid  Mobility: BR  Code Status: full Disposition: ICU      Labs    CMP Latest Ref Rng & Units 09/29/2018 09/27/2018 09/26/2018  Glucose 70 - 99 mg/dL 128(H) 100(H) 152(H)  BUN 8 - 23 mg/dL 64(H) 64(H) 68(H)  Creatinine 0.44 - 1.00 mg/dL 1.48(H) 1.19(H) 1.22(H)  Sodium 135 - 145 mmol/L 138 139 136  Potassium 3.5 - 5.1 mmol/L 5.2(H) 3.3(L) 3.0(L)  Chloride 98 - 111 mmol/L 82(L) 81(L) 82(L)  CO2 22 - 32 mmol/L 48(H) 45(H) 48(H)  Calcium 8.9 - 10.3 mg/dL 9.2 8.8(L) 8.5(L)  Total Protein 6.5 - 8.1 g/dL - 5.4(L) -  Total Bilirubin 0.3 - 1.2 mg/dL - 1.0 -  Alkaline Phos 38 - 126 U/L - 107 -  AST 15 - 41 U/L - 17 -  ALT 0 - 44 U/L - 24 -   CBC Latest Ref Rng & Units 09/29/2018 09/28/2018 09/27/2018  WBC 4.0 - 10.5 K/uL 9.5 8.1 6.4  Hemoglobin 12.0 - 15.0 g/dL 8.8(L) 8.7(L) 8.5(L)  Hematocrit 36.0 - 46.0 % 32.6(L) 31.5(L) 30.1(L)  Platelets 150 - 400 K/uL 121(L) 101(L) 83(L)   CBG (last 3)  Recent Labs    09/28/18 2115 09/29/18 0813 09/29/18 1037  GLUCAP 143* 209* 121*   ABG    Component Value Date/Time   PHART 7.243 (L) 09/29/2018 0942   PCO2ART  09/29/2018 0942    CRITICAL RESULT CALLED TO, READ BACK BY AND VERIFIED WITH:   PO2ART 59.2 (L) 09/29/2018 0942   HCO3 50.2 (H) 09/29/2018 0942   TCO2 49 (H) 10/03/2018 2205   O2SAT 88.0 09/29/2018 0942   .Lynelle Smoke  NP-C  [facility]  [phone]  09/29/2018  09/29/2018, 11:13 AM

## 2018-09-29 NOTE — Progress Notes (Signed)
12:30 ABG reviewed.  FiO2--> 0.5 from 1 RR 14-->16  Given bicarb, likely metabolic compensation for chronic respiratory acidosis.  BNP 980;   Continue diuresis. TTE ordered

## 2018-09-29 NOTE — Progress Notes (Signed)
Chest x-ray reviewed.  ET tube is in good position, approximately 4 cm above the carina.  Bilateral diffuse patchy infiltrates, enhanced vascular markings suggestive of CHF but also concerning for developing PNA. Bases obscured by patient habitus but suggestion of atalectasis, increased cardiac shadow

## 2018-09-29 NOTE — Progress Notes (Signed)
OT Cancellation Note  Patient Details Name: Renee Noble MRN: [id number] DOB: Jun 14, 1955   Cancelled Treatment:    Reason Eval/Treat Not Completed: Medical issues which prohibited therapy(OT signing off. Not medically required at this time.)   Ebony Hail Harold Hedge) Marsa Aris OTR/L Acute Rehabilitation Services Pager: [phone] Office: [place] 09/29/2018, 1:43 PM

## 2018-09-29 NOTE — Procedures (Signed)
Intubation Procedure Note LATRESHIA BEAUCHAINE [id number] 1955/12/09  Procedure: Intubation Indications: Respiratory insufficiency  Procedure Details Consent: Risks of procedure as well as the alternatives and risks of each were explained to the (patient/caregiver).  Consent for procedure obtained.  Initially indicated that she not want to be intubated, but did wish to remain a full code.  As we explained to her the meaning of full code, she then consented for intubation Time Out: Verified patient identification, verified procedure, site/side was marked, verified correct patient position, special equipment/implants available, medications/allergies/relevent history reviewed, required imaging and test results available.  Performed  Drugs:  50 mcg Fentanyl, 2 mg Versed, 20 mg Etomidate, 100 mg succinylcholine. DL x 1 with # 4 Glidescope blade. Grade 1 view. 7.5  tube visualized passing through vocal cords. Following intubation:  positive color change on ETCO2, condensation seen in endotracheal tube, equal breath sounds bilaterally.  Evaluation Hemodynamic Status: BP stable throughout; O2 sats: transiently fell during during procedure Patient's Current Condition: stable Complications: No apparent complications Patient did tolerate procedure well. Chest X-ray ordered to verify placement.  CXR: pending.  Bonna Gains, MD, PhD 09/29/18 10:47 AM

## 2018-09-29 NOTE — Progress Notes (Signed)
[name] Kitchen  PROGRESS NOTE    KIYLAH LOYER  JOA:[id number] DOB: 12-Jun-1955 DOA: 09/18/2018 PCP: [name], PA-C   Brief Narrative:   63 year female with extensive history significant for but not limited tochronic hypoxic respiratory failure on 3 L oxygen at home, COPD, OHS,OSA, chronic diastolic CHF,PAF - not on AC given chronic thrombocytopenia/ recent anemia,diabetes type 2,andobesitypresenting from rehab/ [facility] for altered mental status and hyperkalemia.   Recent hospitalization 4/13- 5/2 for acute on chronic respiratory failure requiring intubation twice and acute on chronic heart failure with noncompliance in wearing CPAP and fluid restriction; was COVID negative twice.  She was admitted for acute on chronic hypoxic and hypercapnic respiratory failure as well as hyperkalemia. She was initially admitted to the ICU on bipap, does not appear she was ever intubated.   Assessment & Plan:   Active Problems:   Acute on chronic respiratory failure with hypoxia and hypercapnia (HCC)   Respiratory failure with hypercapnia (HCC)   Hyperkalemia   Acute on chronic hypoxic/hypercapnic respiratory failure. Severe COPD. OSA/OHS.     - dulera, prn abluterol/duoneb     - oxygen to keep SpO2 88 to 95%     - Bipap qhs and prn     - caution with opiates and sedating medications     - was lethargic this AM -- has a hx of refusing CPap at night; placed on Bipap this AM; lethargy temporarily improved, but her SpO2 dropped into the 60's sustained. Bipap was turned up to 100% ABG was obtain after on BiPap support at that level for at least 20 minutes. Her pH was 7.243, pCO2 undetectable, pO2 59.2; info relayed to PCCM and pt transferred to ICU for intubation.  HFpEF Exacerbation:     - Echo with normal EF. RV with severely reduced systolic function. (see report)      - BNP ~1090.4     - CXR on 5/6 with evidence of HF     - Lasix 80 IV BID now, needs continued diuresis.   Nephrology has now signed off.     - I/O, daily weights -> she notes her baseline weight is ~273. Weight 5/11 charted at 299.61 lbs.  Bed weights have been inaccurate.     - CT scan notable for bilateral pleural effusions with compressive atelectasis; Suspect this is 2/2 overload.       - Continue diuresis     - respiratory distress this AM, see above  Large Right Renal Inferior Pole Mass  Rounded Mass in the region of the head of the pancreas:      - Renal inferior pole mass concerning for malignancy with suggestion of extension of tumor thrombus into R renal vein and possibly IVC     - Also round mass in region of head of pancreas, possibly primary pancreatic neoplasm vs metastatic disease     - Appreciate oncology recommendations     - GI consulted; poor candidate for scope      - IR consulted for possible percutaneous biopsy; appreciate assistance     - Pt did not tolerate MRI, attempt to repeat?     - currently refusing to repeat MRI     - now s/p CT guided Bx of renal mass; awaiting results  Pancytopenia     - s/p granix; monitor CBC  Permanent atrial fibrillation.     - Not on anticoagulation due to chronic thrombocytopenia     - Cardizem     -  HR ok, monitor  Hypokalemia:      - severe, replace, follow  Acute Kidney Injury:      - Baseline creatinine is <1     - Improving with diuresis (peaked at 1.67)     - Renal following, appreciate recs - now signed off  Hx of HLD     - pravachol  RUL pulmonary nodule.     - f/u CT chest in July 2020 as outpt  DM type II.     - SSI with levemir  Hypothyroidism:     - synthroid  Anemia, thrombocytopenia of chronic disease and iron deficiency.     - f/u CBC, stable     - s/p IV iron per renal     - s/p 1 unit pRBC     - stable  Leukopenia     - resolved, s/p granix  Acute metabolic encephalopathy     - resolved  Hx of depression, chronic pain     - scheduled APAP, voltaren, lidocaine patch,  nortriptyline       - PRN oxycodone  Deconditioning.     - PT/OT     - recommending SNF  Was lethargic this AM -- has a hx of refusing CPap at night; placed on Bipap this AM; lethargy temporarily improved, but her SpO2 dropped into the 60's sustained. Bipap was turned up to 100% ABG was obtain after on BiPap support at that level for at least 20 minutes. Her pH was 7.243, pCO2 undetectable, pO2 59.2; info relayed to PCCM and pt transferred to ICU for intubation. Patient transferred for acute respiratory failure w/ hypoxia and hypercarbia.    DVT prophylaxis: SCDs Code Status: FULL Family Communication: None at bedside   Disposition Plan: TBD   Consultants:   PCCM  Neprhology  Oncology  GI  IR  Procedures:   Renal Bx  Intubation   Subjective: Lethargic this AM. Answering some questions. Fighting BiPap.   Objective: Vitals:   09/29/18 0340 09/29/18 0607 09/29/18 0749 09/29/18 0814  BP: (!) 110/58   (!) 114/93  Pulse: 86 86 93 (!) 102  Resp: 12 20 (!) 25 (!) 22  Temp: (!) 97.5 F (36.4 C)   97.8 F (36.6 C)  TempSrc: Oral   Axillary  SpO2: 96% 93% 93% 91%  Weight: (!) 138.3 kg     Height:        Intake/Output Summary (Last 24 hours) at 09/29/2018 1040 Last data filed at 09/29/2018 1002 Gross per 24 hour  Intake 490 ml  Output 601 ml  Net -111 ml   Filed Weights   09/26/18 0300 09/28/18 0435 09/29/18 0340  Weight: 94.8 kg (!) 136.8 kg (!) 138.3 kg    Examination:  General exam: 63 y.o. female lethargic but appears to be fighting BiPap Respiratory system: B/l rhonchi, increased WOB on BiPap Cardiovascular system: S1 & S2 heard but distant, tachy. No JVD, m/r/r. BLE edema Gastrointestinal system: Obsese, soft and nontender. No organomegaly or masses felt. Normal bowel sounds heard. Central nervous system: lethargic. Skin: No rashes, lesions or ulcers Psychiatry: lethargic    Data Reviewed: I have personally reviewed following labs and imaging  studies.  CBC: Recent Labs  Lab 09/26/18 0539 09/27/18 0429 09/27/18 0724 09/28/18 0420 09/29/18 0423  WBC 1.8* 5.8 6.4 8.1 9.5  NEUTROABS 1.4* 4.9 5.5 7.0 8.8*  HGB 7.8* 8.4* 8.5* 8.7* 8.8*  HCT 26.6* 29.4* 30.1* 31.5* 32.6*  MCV 85.5 86.7 86.7 90.5 92.1  PLT  74* 83* 83* 101* 948*   Basic Metabolic Panel: Recent Labs  Lab 09/24/18 0445 09/25/18 0821 09/26/18 0539 09/26/18 1603 09/27/18 0429 09/29/18 0423  NA 135 135 136 136 139 138  K 4.3 2.8* 2.6* 3.0* 3.3* 5.2*  CL 83* 80* 75* 82* 81* 82*  CO2 44* 42* 44* 48* 45* 48*  GLUCOSE 124* 95 104* 152* 100* 128*  BUN 85* 79* 73* 68* 64* 64*  CREATININE 1.44* 1.30* 1.17* 1.22* 1.19* 1.48*  CALCIUM 8.3* 8.3* 8.6* 8.5* 8.8* 9.2  MG 2.0  --  1.6*  --  1.8 1.9  PHOS  --   --   --   --   --  4.8*   GFR: Estimated Creatinine Clearance: 52.7 mL/min (A) (by C-G formula based on SCr of 1.48 mg/dL (H)). Liver Function Tests: Recent Labs  Lab 09/24/18 0445 09/25/18 0821 09/26/18 0539 09/27/18 0429 09/29/18 0423  AST 26 23 16 17   --   ALT 48* 34 28 24  --   ALKPHOS 143* 129* 115 107  --   BILITOT 0.7 0.7 0.7 1.0  --   PROT 5.4* 5.0* 5.0* 5.4*  --   ALBUMIN 3.0* 3.0* 2.9* 2.9* 3.1*   No results for input(s): LIPASE, AMYLASE in the last 168 hours. No results for input(s): AMMONIA in the last 168 hours. Coagulation Profile: Recent Labs  Lab 09/27/18 1634  INR 1.1   Cardiac Enzymes: No results for input(s): CKTOTAL, CKMB, CKMBINDEX, TROPONINI in the last 168 hours. BNP (last 3 results) No results for input(s): PROBNP in the last 8760 hours. HbA1C: No results for input(s): HGBA1C in the last 72 hours. CBG: Recent Labs  Lab 09/28/18 1117 09/28/18 1644 09/28/18 2115 09/29/18 0813 09/29/18 1037  GLUCAP 135* 138* 143* 209* 121*   Lipid Profile: No results for input(s): CHOL, HDL, LDLCALC, TRIG, CHOLHDL, LDLDIRECT in the last 72 hours. Thyroid Function Tests: No results for input(s): TSH, T4TOTAL, FREET4, T3FREE,  THYROIDAB in the last 72 hours. Anemia Panel: No results for input(s): VITAMINB12, FOLATE, FERRITIN, TIBC, IRON, RETICCTPCT in the last 72 hours. Sepsis Labs: No results for input(s): PROCALCITON, LATICACIDVEN in the last 168 hours.  Recent Results (from the past 240 hour(s))  Culture, blood (routine x 2)     Status: None   Collection Time: 10/11/2018  9:50 PM  Result Value Ref Range Status   Specimen Description BLOOD LEFT HAND  Final   Special Requests   Final    BOTTLES DRAWN AEROBIC AND ANAEROBIC Blood Culture adequate volume   Culture   Final    NO GROWTH 5 DAYS Performed at [facility], [address], [place], [place] [address]    Report Status 09/25/2018 FINAL  Final  SARS Coronavirus 2 (CEPHEID- Performed in [place] [facility] lab), Hosp Order     Status: None   Collection Time: 09/24/2018  9:54 PM  Result Value Ref Range Status   SARS Coronavirus 2 NEGATIVE NEGATIVE Final    Comment: (NOTE) If result is NEGATIVE SARS-CoV-2 target nucleic acids are NOT DETECTED. The SARS-CoV-2 RNA is generally detectable in upper and lower  respiratory specimens during the acute phase of infection. The lowest  concentration of SARS-CoV-2 viral copies this assay can detect is 250  copies / mL. A negative result does not preclude SARS-CoV-2 infection  and should not be used as the sole basis for treatment or other  patient management decisions.  A negative result may occur with  improper specimen  collection / handling, submission of specimen other  than nasopharyngeal swab, presence of viral mutation(s) within the  areas targeted by this assay, and inadequate number of viral copies  (<250 copies / mL). A negative result must be combined with clinical  observations, patient history, and epidemiological information. If result is POSITIVE SARS-CoV-2 target nucleic acids are DETECTED. The SARS-CoV-2 RNA is generally detectable in upper and lower  respiratory specimens dur ing the acute  phase of infection.  Positive  results are indicative of active infection with SARS-CoV-2.  Clinical  correlation with patient history and other diagnostic information is  necessary to determine patient infection status.  Positive results do  not rule out bacterial infection or co-infection with other viruses. If result is PRESUMPTIVE POSTIVE SARS-CoV-2 nucleic acids MAY BE PRESENT.   A presumptive positive result was obtained on the submitted specimen  and confirmed on repeat testing.  While 2019 novel coronavirus  (SARS-CoV-2) nucleic acids may be present in the submitted sample  additional confirmatory testing may be necessary for epidemiological  and / or clinical management purposes  to differentiate between  SARS-CoV-2 and other Sarbecovirus currently known to infect humans.  If clinically indicated additional testing with an alternate test  methodology [phone]) is advised. The SARS-CoV-2 RNA is generally  detectable in upper and lower respiratory sp ecimens during the acute  phase of infection. The expected result is Negative. Fact Sheet for Patients:  StrictlyIdeas.no Fact Sheet for Healthcare Providers: BankingDealers.co.za This test is not yet approved or cleared by the [place] FDA and has been authorized for detection and/or diagnosis of SARS-CoV-2 by FDA under an Emergency Use Authorization (EUA).  This EUA will remain in effect (meaning this test can be used) for the duration of the COVID-19 declaration under Section 564(b)(1) of the Act, 21 U.S.C. section 360bbb-3(b)(1), unless the authorization is terminated or revoked sooner. Performed at [facility], [place] [address], [place], [place] [address]   Culture, blood (routine x 2)     Status: Abnormal   Collection Time: 09/30/2018 10:05 PM  Result Value Ref Range Status   Specimen Description BLOOD LEFT ARM  Final   Special Requests   Final    BOTTLES DRAWN  AEROBIC AND ANAEROBIC Blood Culture results may not be optimal due to an excessive volume of blood received in culture bottles   Culture  Setup Time   Final    AEROBIC BOTTLE ONLY GRAM POSITIVE COCCI CRITICAL RESULT CALLED TO, READ BACK BY AND VERIFIED WITH: PHARMD J LEDFORD [id number] AT 67 AM BY CM    Culture (A)  Final    STAPHYLOCOCCUS SPECIES (COAGULASE NEGATIVE) THE SIGNIFICANCE OF ISOLATING THIS ORGANISM FROM A SINGLE SET OF BLOOD CULTURES WHEN MULTIPLE SETS ARE DRAWN IS UNCERTAIN. PLEASE NOTIFY THE MICROBIOLOGY DEPARTMENT WITHIN ONE WEEK IF SPECIATION AND SENSITIVITIES ARE REQUIRED. Performed at [facility], [place] [address], [place], [place] [address]    Report Status 09/23/2018 FINAL  Final  Blood Culture ID Panel (Reflexed)     Status: Abnormal   Collection Time: 10/12/2018 10:05 PM  Result Value Ref Range Status   Enterococcus species NOT DETECTED NOT DETECTED Final   Listeria monocytogenes NOT DETECTED NOT DETECTED Final   Staphylococcus species DETECTED (A) NOT DETECTED Final    Comment: Methicillin (oxacillin) resistant coagulase negative staphylococcus. Possible blood culture contaminant (unless isolated from more than one blood culture draw or clinical case suggests pathogenicity). No antibiotic treatment is indicated for blood  culture  contaminants. CRITICAL RESULT CALLED TO, READ BACK BY AND VERIFIED WITH: PHARMD J LEDFORD [id number] AT 40 AM BY CM    Staphylococcus aureus (BCID) NOT DETECTED NOT DETECTED Final   Methicillin resistance DETECTED (A) NOT DETECTED Final    Comment: CRITICAL RESULT CALLED TO, READ BACK BY AND VERIFIED WITH: PHARMD J LEDFORD [id number] AT 32 AM BY CM    Streptococcus species NOT DETECTED NOT DETECTED Final   Streptococcus agalactiae NOT DETECTED NOT DETECTED Final   Streptococcus pneumoniae NOT DETECTED NOT DETECTED Final   Streptococcus pyogenes NOT DETECTED NOT DETECTED Final   Acinetobacter baumannii NOT DETECTED NOT DETECTED Final    Enterobacteriaceae species NOT DETECTED NOT DETECTED Final   Enterobacter cloacae complex NOT DETECTED NOT DETECTED Final   Escherichia coli NOT DETECTED NOT DETECTED Final   Klebsiella oxytoca NOT DETECTED NOT DETECTED Final   Klebsiella pneumoniae NOT DETECTED NOT DETECTED Final   Proteus species NOT DETECTED NOT DETECTED Final   Serratia marcescens NOT DETECTED NOT DETECTED Final   Haemophilus influenzae NOT DETECTED NOT DETECTED Final   Neisseria meningitidis NOT DETECTED NOT DETECTED Final   Pseudomonas aeruginosa NOT DETECTED NOT DETECTED Final   Candida albicans NOT DETECTED NOT DETECTED Final   Candida glabrata NOT DETECTED NOT DETECTED Final   Candida krusei NOT DETECTED NOT DETECTED Final   Candida parapsilosis NOT DETECTED NOT DETECTED Final   Candida tropicalis NOT DETECTED NOT DETECTED Final    Comment: Performed at [facility], [address] [place], [place] [address]  MRSA PCR Screening     Status: None   Collection Time: 09/21/18  2:34 AM  Result Value Ref Range Status   MRSA by PCR NEGATIVE NEGATIVE Final    Comment:        The GeneXpert MRSA Assay (FDA approved for NASAL specimens only), is one component of a comprehensive MRSA colonization surveillance program. It is not intended to diagnose MRSA infection nor to guide or monitor treatment for MRSA infections. Performed at [facility], [place] [address] [place], [place] [address]          Radiology Studies: Ct Biopsy  Result Date: 10-10-2018 INDICATION: Remote history of left-sided RCC, post nephrectomy, now with large right-sided renal mass worry for contralateral RCC. EXAM: CT BIOPSY COMPARISON:  CT the chest, abdomen pelvis-09/27/2018 MEDICATIONS: Zofran 4 mg IV ANESTHESIA/SEDATION: Versed 0.5 mg IV Sedation time: 22 minutes; The patient was continuously monitored during the procedure by the interventional radiology nurse under my direct supervision. CONTRAST:  None. COMPLICATIONS: None  immediate. PROCEDURE: Informed consent was obtained from the patient following an explanation of the procedure, risks, benefits and alternatives. A time out was performed prior to the initiation of the procedure. The patient was positioned supine on the CT table and a limited CT was performed for procedural planning demonstrating unchanged size and appearance of the infiltrative mass involving the caudal aspect the right kidney with dominant component measuring 9.1 x 8.4 cm (image 51, series 2). The procedure was planned. The operative site was prepped and draped in the usual sterile fashion. Appropriate trajectory was confirmed with a 22 gauge spinal needle after the adjacent tissues were anesthetized with 1% Lidocaine with epinephrine. Under intermittent CT guidance, a 17 gauge coaxial needle was advanced into the peripheral aspect of the mass. Appropriate positioning was confirmed and 5 core needle biopsy samples were obtained with an 18 gauge core needle biopsy device. The co-axial needle was removed following the administration of a  Gel-Foam slurry and superficial hemostasis was achieved with manual compression. A limited postprocedural CT was negative for hemorrhage or additional complication. A dressing was placed. The patient tolerated the procedure well without immediate postprocedural complication. IMPRESSION: Technically successful CT guided core needle biopsy of infiltrative mass involving the right kidney. Electronically Signed   By: Sandi Mariscal M.D.   On: 09/28/2018 10:45        Scheduled Meds: . acetaminophen  650 mg Oral Q6H  . chlorhexidine  15 mL Mouth Rinse BID  . diclofenac sodium  2 g Topical QID  . diltiazem  180 mg Oral Daily  . fentaNYL      . furosemide  80 mg Intravenous BID  . insulin aspart  0-15 Units Subcutaneous TID WC  . insulin aspart  0-5 Units Subcutaneous QHS  . insulin detemir  10 Units Subcutaneous BID  . levothyroxine  88 mcg Oral Q0600  . lidocaine  1 patch  Transdermal Q24H  . mouth rinse  15 mL Mouth Rinse q12n4p  . midazolam      . mometasone-formoterol  2 puff Inhalation BID  . nortriptyline  25 mg Oral QHS  . polyethylene glycol  17 g Oral BID  . potassium chloride  40 mEq Oral TID  . pravastatin  20 mg Oral Daily  . senna-docusate  2 tablet Oral BID   Continuous Infusions: . sodium chloride 10 mL/hr at 09/22/18 1400  . dexmedetomidine (PRECEDEX) IV infusion       LOS: 9 days    Time spent: 90 minutes spent in the coordination of care including 30 minutes of critical care time.    Jonnie Finner, DO [facility] Pager [phone]  If 7PM-7AM, please contact night-coverage [email or web address] Password [email or web address] 09/29/2018, 10:40 AM

## 2018-09-29 NOTE — Progress Notes (Signed)
Spoke with Alyse Low, RN regarding inability to obtain consent from relative listed in computer due to no answer and mailbox not taking messages.  Alyse Low will attempt to get in touch with relative or medical necessity consent.  Will follow up.  Carolee Rota, RN VAST

## 2018-09-29 NOTE — Progress Notes (Signed)
Transferred pt from 2w11 to 3M07 on bipap without incident.

## 2018-09-29 NOTE — Progress Notes (Signed)
  Echocardiogram 2D Echocardiogram has been performed.  Jannett Celestine 09/29/2018, 4:25 PM

## 2018-09-29 NOTE — Progress Notes (Signed)
Physical Therapy Discharge Patient Details Name: Renee Noble MRN: [id number] DOB: 07-Mar-1956 Today's Date: 09/29/2018 Time:  -     Patient discharged from PT services secondary to medical decline - will need to re-order PT to resume therapy services.  Please see latest therapy progress note for current level of functioning and progress toward goals.    Progress and discharge plan discussed with patient and/or caregiver: Patient unable to participate in discharge planning and no caregivers available  GP     Shary Decamp [facility] 09/29/2018, 10:33 AM  Samirah Scarpati [place] Pager [phone] Office [phone]

## 2018-09-30 ENCOUNTER — Inpatient Hospital Stay (HOSPITAL_COMMUNITY): Payer: Medicare Other

## 2018-09-30 DIAGNOSIS — J9621 Acute and chronic respiratory failure with hypoxia: Secondary | ICD-10-CM

## 2018-09-30 DIAGNOSIS — J189 Pneumonia, unspecified organism: Secondary | ICD-10-CM

## 2018-09-30 LAB — BASIC METABOLIC PANEL
Anion gap: 8 (ref 5–15)
BUN: 71 mg/dL — ABNORMAL HIGH (ref 8–23)
CO2: 46 mmol/L — ABNORMAL HIGH (ref 22–32)
Calcium: 8.7 mg/dL — ABNORMAL LOW (ref 8.9–10.3)
Chloride: 84 mmol/L — ABNORMAL LOW (ref 98–111)
Creatinine, Ser: 1.42 mg/dL — ABNORMAL HIGH (ref 0.44–1.00)
GFR calc Af Amer: 45 mL/min — ABNORMAL LOW (ref >60)
GFR calc non Af Amer: 39 mL/min — ABNORMAL LOW (ref >60)
Glucose, Bld: 130 mg/dL — ABNORMAL HIGH (ref 70–99)
Potassium: 4.7 mmol/L (ref 3.5–5.1)
Sodium: 138 mmol/L (ref 135–145)

## 2018-09-30 LAB — CBC WITH DIFFERENTIAL/PLATELET
Abs Immature Granulocytes: 0.51 10*3/uL — ABNORMAL HIGH (ref 0.00–0.07)
Basophils Absolute: 0 10*3/uL (ref 0.0–0.1)
Basophils Relative: 0 %
Eosinophils Absolute: 0 10*3/uL (ref 0.0–0.5)
Eosinophils Relative: 0 %
HCT: 27.4 % — ABNORMAL LOW (ref 36.0–46.0)
Hemoglobin: 7.8 g/dL — ABNORMAL LOW (ref 12.0–15.0)
Immature Granulocytes: 8 %
Lymphocytes Relative: 7 %
Lymphs Abs: 0.5 10*3/uL — ABNORMAL LOW (ref 0.7–4.0)
MCH: 25 pg — ABNORMAL LOW (ref 26.0–34.0)
MCHC: 28.5 g/dL — ABNORMAL LOW (ref 30.0–36.0)
MCV: 87.8 fL (ref 80.0–100.0)
Monocytes Absolute: 0.5 10*3/uL (ref 0.1–1.0)
Monocytes Relative: 8 %
Neutro Abs: 5.3 10*3/uL (ref 1.7–7.7)
Neutrophils Relative %: 77 %
Platelets: 101 10*3/uL — ABNORMAL LOW (ref 150–400)
RBC: 3.12 MIL/uL — ABNORMAL LOW (ref 3.87–5.11)
RDW: 20.5 % — ABNORMAL HIGH (ref 11.5–15.5)
WBC: 6.8 10*3/uL (ref 4.0–10.5)
nRBC: 0.3 % — ABNORMAL HIGH (ref 0.0–0.2)

## 2018-09-30 LAB — GLUCOSE, CAPILLARY
Glucose-Capillary: 125 mg/dL — ABNORMAL HIGH (ref 70–99)
Glucose-Capillary: 155 mg/dL — ABNORMAL HIGH (ref 70–99)
Glucose-Capillary: 163 mg/dL — ABNORMAL HIGH (ref 70–99)
Glucose-Capillary: 166 mg/dL — ABNORMAL HIGH (ref 70–99)
Glucose-Capillary: 171 mg/dL — ABNORMAL HIGH (ref 70–99)
Glucose-Capillary: 192 mg/dL — ABNORMAL HIGH (ref 70–99)

## 2018-09-30 LAB — PHOSPHORUS: Phosphorus: 2.9 mg/dL (ref 2.5–4.6)

## 2018-09-30 LAB — MAGNESIUM: Magnesium: 1.9 mg/dL (ref 1.7–2.4)

## 2018-09-30 LAB — LACTATE DEHYDROGENASE: LDH: 123 U/L (ref 98–192)

## 2018-09-30 MED ORDER — SODIUM CHLORIDE 0.9 % IV SOLN
2.0000 g | INTRAVENOUS | Status: AC
  Administered 2018-09-30 – 2018-10-04 (×5): 2 g via INTRAVENOUS
  Filled 2018-09-30 (×5): qty 20

## 2018-09-30 MED ORDER — DEXTROSE 5 % IV SOLN
250.0000 mg | INTRAVENOUS | Status: DC
  Administered 2018-09-30 – 2018-10-01 (×2): 250 mg via INTRAVENOUS
  Filled 2018-09-30 (×4): qty 250

## 2018-09-30 NOTE — Progress Notes (Signed)
NAME:  Renee Noble, MRN:  [id number], DOB:  [birth year], LOS: 0 ADMISSION DATE:  09/18/2018, CONSULTATION DATE:  09/21/2018 REFERRING MD:  Dr. Ralene Bathe, CHIEF COMPLAINT:  AMS  Brief History   63 yo female from SNF with altered mental status, acute on chronic hypercapnia with acidosis and hyperkalemia readmitted May 6. Patient has been seen by oncology and gastroenterology. Patient was treated with BiPAP support diuresis initially.  Transfer to hospitalist on May 9. Has been found to have a newly identified 4.7 cm mass of the head of the pancreas and a right kidney mass.  Per GI notes felt to be a poor candidate for endoscopy.  Plans for MRI MRCP.  Consulted by interventional radiology for right renal mass biopsy. 5/15 am with altered mental status , lethargic , PCCM consulted , patient required intubation .   Past Medical History  Severe COPD, chronic respiratory failure on 3 -4 liters oxygen, diastolic CHF, DM II, Hypothyroidism, OSA, Depression, Chronic pain, Hepatitis C , OHS  Cirrhosis and splenomegaly on CT abdomen 2016, chronic thrombocytopenia, previous nephrectomy, insulin-dependent diabetes  patient has hepatitis C treated with SOVALDI and ribavirin 2015.  Hospitalization April 13 through Sep 16, 2018 for acute on chronic respiratory failure requiring intubation x2.  Felt to be noncompliant of nocturnal CPAP.  Significant Hospital Events   4/13- 5/2 hospitalized 5/07 Admitted 5/08 Transfered to progressive care for BiPAP 5/15 Transferred to ICU for AHRF requiring intubation  Consults:  Renal 5/07  GI 5/13 Oncology 5/11 IR 5/14   Procedures:  5/15 ETT   Significant Diagnostic Tests:  5/13 CT abd/pelvis -10.7 cm solid mass right kidney, 4.4 cm pancreatic head mass, moderate bilateral pleural effusions, hepatic cirrhosis, portal venous hypertension, mild ascites, diffuse mesenteric and body wall edema Moderate splenomegaly, no pathologically enlarged lymph nodes  Micro Data:   5/6 BC x 2 >>NEG x  5/6 BC -Staph species (coag neg )  5/6 COVID >> neg  Antimicrobials:    Interim history/subjective:  Febrile to 101.3 overnight, currently afebrile.  Objective   BP (!) 101/55 (BP Location: Left Arm)   Pulse 73   Temp 98.2 F (36.8 C) (Oral)   Resp 15   Ht 5\' 4"  (1.626 m)   Wt 135.4 kg   SpO2 100%   BMI 51.24 kg/m   I/O last 3 completed shifts: In: 1550.2 [P.O.:250; I.V.:734.5; NG/GT:565.7] Out: 1280 [Urine:1280]  Physical Exam: General: Obese, chronically ill-appearing, no acute distress HENT: [place], AT, ETT in place Eyes: EOMI, no scleral icterus Respiratory: Decreased breath sounds bilaterally.  No crackles, wheezing or rales Cardiovascular: RRR, -M/R/G, no JVD GI: BS+, soft, nontender Extremities:2+ pitting edema in lower extremities,-tenderness Neuro: Awakens to voice, follows commands, moves extremities x 4 Skin: Stasis dermatitis GU: Foley in place  CXR 5/16 personally reviewed by me: Bilateral ASD and bilateral small pleural effusions. ETT in place  Resolved Hospital Problem list    Assessment & Plan:  63 year old female with morbid obesity, chronic respiratory failure secondary to COPD/OSA/OHS, chronic diastolic heart failure, AF and DM2 transferred to ICU for acute on chronic hypercarbic and hypoxemic respiratory failure requiring intubation.  Acute on chronic hypoxic/hypercapnic respiratory failure Likely multifactorial in setting of known COPD/OSA/OHS and acute heart failure COVID-19 negative Plan Full vent support SBT daily Wean FIO2/PEEP for goal SpO2 88-95%. Decreased FIO2 to 50% now VAP Bronchodilators Hold diuresis today for low-normal pressures Obtain trach asp and blood cultures CAP coverage. QTc 360 (patient on azithro and nortryptyline)  Permanent atrial fibrillation Acute on chronic diastolic CHF TTE 5/7 EF 53-74% , decreased RV systolic fxn , RV severe dilation  TTE 5/15 EF 60-65%, reduced RV systolic fxn, mild  RV enlargement, moderate biatrial enlargement Plan Telemetry   PO diltiazem 60mg  q8h    Not candidate for oral anticoagulation reportedly due to chronic thrombocytopenia   AoCKD Monitor UOP/Cr  DM type II Hypothyroidism Plan SSI  Continue synthroid  Anemia, thrombocytopenia of chronic disease and iron deficiency. Plan Trend CBC Holding anticoagulation/DVT ppx. On SCDs   Hx of depression, chronic pain. Plan On home nortryptyline  Best practice:  Diet: TF  DVT prophylaxis: SCDs GI prophylaxis: Pepcid  Mobility: BR  Code Status: full Disposition: ICU   Labs    CMP Latest Ref Rng & Units 09/30/2018 09/29/2018 09/29/2018  Glucose 70 - 99 mg/dL 130(H) - -  BUN 8 - 23 mg/dL 71(H) - -  Creatinine 0.44 - 1.00 mg/dL 1.42(H) - -  Sodium 135 - 145 mmol/L 138 134(L) 134(L)  Potassium 3.5 - 5.1 mmol/L 4.7 4.9 5.6(H)  Chloride 98 - 111 mmol/L 84(L) - -  CO2 22 - 32 mmol/L 46(H) - -  Calcium 8.9 - 10.3 mg/dL 8.7(L) - -  Total Protein 6.5 - 8.1 g/dL - - -  Total Bilirubin 0.3 - 1.2 mg/dL - - -  Alkaline Phos 38 - 126 U/L - - -  AST 15 - 41 U/L - - -  ALT 0 - 44 U/L - - -   CBC Latest Ref Rng & Units 09/30/2018 09/29/2018 09/29/2018  WBC 4.0 - 10.5 K/uL 6.8 - -  Hemoglobin 12.0 - 15.0 g/dL 7.8(L) 9.5(L) 10.2(L)  Hematocrit 36.0 - 46.0 % 27.4(L) 28.0(L) 30.0(L)  Platelets 150 - 400 K/uL 101(L) - -   CBG (last 3)  Recent Labs    09/29/18 2305 09/30/18 0302 09/30/18 0759  GLUCAP 114* 125* 155*   ABG    Component Value Date/Time   PHART 7.546 (H) 09/29/2018 1853   PCO2ART 58.1 (H) 09/29/2018 1853   PO2ART 133.0 (H) 09/29/2018 1853   HCO3 50.3 (H) 09/29/2018 1853   TCO2 >50 (H) 09/29/2018 1853   O2SAT 99.0 09/29/2018 1853   Critical Care: 35 min  The patient is critically ill with multiple organ systems failure and requires high complexity decision making for assessment and support, frequent evaluation and titration of therapies, application of advanced monitoring  technologies and extensive interpretation of multiple databases.   Critical Care Time devoted to patient care services described in this note is 35 Minutes. This time reflects time of care of this signee Dr. Rodman Pickle. This critical care time does not reflect procedure time, or teaching time or supervisory time of PA/NP/Med student/Med Resident etc but could involve care discussion time.  Rodman Pickle, M.D. [facility] Pulmonary/Critical Care Medicine Pager: [phone] After hours pager: [phone]

## 2018-10-01 LAB — CBC
HCT: 28.7 % — ABNORMAL LOW (ref 36.0–46.0)
Hemoglobin: 8.2 g/dL — ABNORMAL LOW (ref 12.0–15.0)
MCH: 24.8 pg — ABNORMAL LOW (ref 26.0–34.0)
MCHC: 28.6 g/dL — ABNORMAL LOW (ref 30.0–36.0)
MCV: 87 fL (ref 80.0–100.0)
Platelets: 94 10*3/uL — ABNORMAL LOW (ref 150–400)
RBC: 3.3 MIL/uL — ABNORMAL LOW (ref 3.87–5.11)
RDW: 20.8 % — ABNORMAL HIGH (ref 11.5–15.5)
WBC: 7.8 10*3/uL (ref 4.0–10.5)
nRBC: 0 % (ref 0.0–0.2)

## 2018-10-01 LAB — BASIC METABOLIC PANEL
Anion gap: 8 (ref 5–15)
BUN: 76 mg/dL — ABNORMAL HIGH (ref 8–23)
CO2: 47 mmol/L — ABNORMAL HIGH (ref 22–32)
Calcium: 8.5 mg/dL — ABNORMAL LOW (ref 8.9–10.3)
Chloride: 85 mmol/L — ABNORMAL LOW (ref 98–111)
Creatinine, Ser: 1.25 mg/dL — ABNORMAL HIGH (ref 0.44–1.00)
GFR calc Af Amer: 52 mL/min — ABNORMAL LOW (ref >60)
GFR calc non Af Amer: 45 mL/min — ABNORMAL LOW (ref >60)
Glucose, Bld: 153 mg/dL — ABNORMAL HIGH (ref 70–99)
Potassium: 3.4 mmol/L — ABNORMAL LOW (ref 3.5–5.1)
Sodium: 140 mmol/L (ref 135–145)

## 2018-10-01 LAB — MAGNESIUM: Magnesium: 1.8 mg/dL (ref 1.7–2.4)

## 2018-10-01 LAB — GLUCOSE, CAPILLARY
Glucose-Capillary: 160 mg/dL — ABNORMAL HIGH (ref 70–99)
Glucose-Capillary: 135 mg/dL — ABNORMAL HIGH (ref 70–99)
Glucose-Capillary: 116 mg/dL — ABNORMAL HIGH (ref 70–99)
Glucose-Capillary: 117 mg/dL — ABNORMAL HIGH (ref 70–99)

## 2018-10-01 LAB — PHOSPHORUS: Phosphorus: 2.7 mg/dL (ref 2.5–4.6)

## 2018-10-01 MED ORDER — AMMONIUM LACTATE 12 % EX LOTN
TOPICAL_LOTION | Freq: Two times a day (BID) | CUTANEOUS | Status: DC
  Administered 2018-10-01 – 2018-10-05 (×4): via TOPICAL
  Filled 2018-10-01: qty 225

## 2018-10-01 MED ORDER — POTASSIUM CHLORIDE 20 MEQ/15ML (10%) PO SOLN
40.0000 meq | Freq: Once | ORAL | Status: AC
  Administered 2018-10-01: 06:00:00 40 meq
  Filled 2018-10-01: qty 30

## 2018-10-01 NOTE — Progress Notes (Signed)
Patient is on NIV QHS at this time tolerating it fairly well.

## 2018-10-01 NOTE — Evaluation (Signed)
Clinical/Bedside Swallow Evaluation Patient Details  Name: Renee Noble MRN: [id number] Date of Birth: 1955/12/18  Today's Date: 10/01/2018 Time: SLP Start Time (ACUTE ONLY): 1347 SLP Stop Time (ACUTE ONLY): 1403 SLP Time Calculation (min) (ACUTE ONLY): 16 min  Past Medical History:  Past Medical History:  Diagnosis Date  . Abdominal mass of other site   . Cervical compression fracture ([place])   . CHF (congestive heart failure) ([place])   . Chronic kidney disease, stage 3 (HCC)    Borderline Stage 2-3  . Chronic lower limb pain   . COPD (chronic obstructive pulmonary disease) ([place])   . CTS (carpal tunnel syndrome)   . Depression   . Diabetes ([place])    Type II  . Fibromyalgia   . Hepatitis C    cured last year (2018)  . Hypercholesteremia   . Hypertension   . Hypothyroidism   . Morbid obesity ([place])   . Neuropathy    Diabetes  . OSA (obstructive sleep apnea)   . Osteoarthritis   . Renal cancer ([place])    Left Kidney Removed  . RLS (restless legs syndrome)   . Syncope   . Venous stasis    Past Surgical History:  Past Surgical History:  Procedure Laterality Date  . ABDOMINAL HERNIA REPAIR     x2  . ABDOMINAL HYSTERECTOMY    . BLADDER SUSPENSION    . CHOLECYSTECTOMY    . CYST EXCISION     Head  . INCISIONAL HERNIA REPAIR    . KNEE ARTHROSCOPY     Bilateral  . NEPHRECTOMY     Left  . TONSILLECTOMY    . TOOTH EXTRACTION    . UMBILICAL HERNIA REPAIR    . VAGINA SURGERY    . WISDOM TOOTH EXTRACTION     HPI:  Patient is a 63 y.o. female with PMH: fibromyalgia, CHF, nephrectomy, chronic hypoxic respiratory failure on 3 L continuous nasal cannula O2, obesity, DM-2, chronic diastolic heart failure admitted from SNF on 5/6 with AMS, acute on chronic hypercapnia with acidosis and hyperkalemia.  Found to have newly identified 4.7 cm mass of the head of the pancreas and a right kidney mass. Transferred to ICU for AHRF requiring intubation 5/15-5/17.  Recent hospitalization  4/13-5/2 for acute on chronic respiratory failure requiring intubation x2.  Pt was followed by SLP during last admission for dysphagia.    Assessment / Plan / Recommendation Clinical Impression  Pt presents with a post-extubation dysphagia marked by consistent cough response after consuming water, concerning for glottal insufficiency.  Voice remains aphonic after being extubated this a.m.  Pt demonstrated adequate safety with teaspoons of applesauce.  For tonight, recommend allowing meds crushed with applesauce, may have ice chips, but hold on PO diet given current risk for aspiration.  SLP will f/u next date to re-assess for readiness.  Anticipate that pt will advance to POs quickly given short period of intubation.  D/W MD, RN.  SLP Visit Diagnosis: Dysphagia, unspecified (R13.10)    Aspiration Risk       Diet Recommendation   meds crushed with puree, ice chips  Medication Administration: Crushed with puree    Other  Recommendations Oral Care Recommendations: Oral care QID   Follow up Recommendations Other (comment)(tba)      Frequency and Duration min 3x week  1 week       Prognosis Prognosis for Safe Diet Advancement: Good      Swallow Study   General Date of Onset: 09/01/18 HPI:  Patient is a 63 y.o. female with PMH: fibromyalgia, CHF, nephrectomy, chronic hypoxic respiratory failure on 3 L continuous nasal cannula O2, obesity, DM-2, chronic diastolic heart failure admitted from SNF on 5/6 with AMS, acute on chronic hypercapnia with acidosis and hyperkalemia.  Found to have newly identified 4.7 cm mass of the head of the pancreas and a right kidney mass. Transferred to ICU for AHRF requiring intubation 5/15-5/17.  Recent hospitalization 4/13-5/2 for acute on chronic respiratory failure requiring intubation x2.  Pt was followed by SLP during last admission for dysphagia.  Type of Study: Bedside Swallow Evaluation Previous Swallow Assessment: BSE 09/01/2018 Diet Prior to this Study:  NPO Temperature Spikes Noted: No Respiratory Status: Nasal cannula History of Recent Intubation: Yes Length of Intubations (days): 2 days Date extubated: 09/01/18 Behavior/Cognition: Alert;Cooperative Oral Cavity Assessment: Dry Oral Care Completed by SLP: No Oral Cavity - Dentition: Edentulous Vision: Functional for self-feeding Self-Feeding Abilities: Able to feed self Patient Positioning: Upright in bed Baseline Vocal Quality: Aphonic Volitional Cough: Weak Volitional Swallow: Able to elicit    Oral/Motor/Sensory Function Overall Oral Motor/Sensory Function: Within functional limits   Ice Chips Ice chips: Within functional limits   Thin Liquid Thin Liquid: Impaired Presentation: Cup;Straw Pharyngeal  Phase Impairments: Cough - Delayed    Nectar Thick Nectar Thick Liquid: Not tested   Honey Thick Honey Thick Liquid: Not tested   Puree Puree: Within functional limits Presentation: Self Fed;Spoon   Solid     Solid: Not tested      Juan Quam Laurice 10/01/2018,2:26 PM  Estill Bamberg L. Tivis Ringer, [place] Office number [phone] Pager [phone]

## 2018-10-01 NOTE — Procedures (Signed)
Extubation Procedure Note  Patient Details:   Name: LAYNEY GILLSON DOB: 11-10-1955 MRN: [id number]   Airway Documentation:    Vent end date: 10/01/18 Vent end time: 1037   Evaluation  O2 sats: stable throughout Complications: No apparent complications Patient did tolerate procedure well. Bilateral Breath Sounds: Diminished   Yes  PT was extubated to bipap at 7/7 with 40%  PT has strong cough and able to clear secretions  Sats are stable  RT to monitor    Nemiah Kissner, Leonie Douglas 10/01/2018, 10:38 AM

## 2018-10-01 NOTE — Consult Note (Signed)
[facility] Nurse wound consult note Reason for Consult: Bilateral venous insufficiency with chronic, dry ulceration to right lateral LE and healed area to left lateral LE. Intertriginous partial thickness areas at the gluteal cleft (friction plus moisture). Requires bariatric bed with low air loss feature due to body habitus (not weight). Fecal incontinence. Wound type: CVI, moisture associated skin damage, healed full thickness pressure injury to left ischial tuberosity Pressure Injury POA: Yes Measurement: Left IT:  3cm x 2.5cm newly reepithelialized Left lateral LE: Healed VLU. Staff noted weeping at area near knee today, no longer weeping. Right medial LE: 3cm x 2.5cm dry, crusted ulcer. Unable to assess depth due to the presence of dried serum (scab) vs eschar Intertriginous skin loss at the gluteal cleft: partial thickness two area measuring 3cm x 0.4cm x 0.2cm with pink, moist wound bed. Scant serous to serosanguinous exudate. Wound bed:As described above Drainage (amount, consistency, odor) As described above Periwound: dry, intact Dressing procedure/placement/frequency: I will provide a bariatric mattress with low air loss feature due to body habitus; Prevalon Boots will correct lateral rotation of feet and pressure redistribution of the heels. Silicone foam dressings to gluteal cleft is in place and will be continued.  [facility] nursing team will not follow, but will remain available to this patient, the nursing and medical teams.  Please re-consult if needed. Thanks, Maudie Flakes, MSN, RN, [place], Arther Abbott  Pager# [phone]

## 2018-10-01 NOTE — Progress Notes (Signed)
NAME:  Renee Noble, MRN:  [id number], DOB:  1955/06/22, LOS: 0 ADMISSION DATE:  09/21/2018, CONSULTATION DATE:  09/21/2018 REFERRING MD:  Dr. Ralene Bathe, CHIEF COMPLAINT:  AMS  Brief History   63 yo female from SNF with altered mental status, acute on chronic hypercapnia with acidosis and hyperkalemia readmitted May 6. Patient has been seen by oncology and gastroenterology. Patient was treated with BiPAP support diuresis initially.  Transfer to hospitalist on May 9. Has been found to have a newly identified 4.7 cm mass of the head of the pancreas and a right kidney mass.  Per GI notes felt to be a poor candidate for endoscopy.  Plans for MRI MRCP.  Consulted by interventional radiology for right renal mass biopsy. 5/15 am with altered mental status , lethargic , PCCM consulted , patient required intubation .   Past Medical History  Severe COPD, chronic respiratory failure on 3 -4 liters oxygen, diastolic CHF, DM II, Hypothyroidism, OSA, Depression, Chronic pain, Hepatitis C , OHS  Cirrhosis and splenomegaly on CT abdomen 2016, chronic thrombocytopenia, previous nephrectomy, insulin-dependent diabetes  patient has hepatitis C treated with SOVALDI and ribavirin 2015.  Hospitalization April 13 through Sep 16, 2018 for acute on chronic respiratory failure requiring intubation x2.  Felt to be noncompliant of nocturnal CPAP.  Significant Hospital Events   4/13- 5/2 hospitalized 5/07 Admitted 5/08 Transfered to progressive care for BiPAP 5/15 Transferred to ICU for AHRF requiring intubation 5/16 Febrile 5/17 Tolerated SBT and plan for extubation  Consults:  Renal 5/07  GI 5/13 Oncology 5/11 IR 5/14   Procedures:  5/15 ETT   Significant Diagnostic Tests:  5/13 CT abd/pelvis -10.7 cm solid mass right kidney, 4.4 cm pancreatic head mass, moderate bilateral pleural effusions, hepatic cirrhosis, portal venous hypertension, mild ascites, diffuse mesenteric and body wall edema Moderate  splenomegaly, no pathologically enlarged lymph nodes  Micro Data:  5/6 BC x 2 >>NEG x  5/6 BC -Staph species (coag neg )  5/6 COVID >> neg 5/16 Moderate gram variable rod, final pending Antimicrobials:  Ceftriaxone 5/16> Azithro 5/16>  Interim history/subjective:  Afebrile. Tolerated SBT this am. Required low-dose vasopressor support overnight.  Objective   BP (!) 105/55   Pulse 94   Temp 99.4 F (37.4 C) (Oral)   Resp (!) 28   Ht 5\' 4"  (1.626 m)   Wt 133.4 kg   SpO2 99%   BMI 50.48 kg/m   I/O last 3 completed shifts: In: 3785.2 [I.V.:1334.4; NG/GT:2225.7; IV Piggyback:225.1] Out: 2482 [Urine:3200]  Physical Exam: General: Obese, chronically ill-appearing, no acute distress HENT: [place], AT, ETT in place Eyes: EOMI, no scleral icterus Respiratory: Decreased breath sounds bilaterally.  No crackles, wheezing or rales Cardiovascular: RRR, -M/R/G, no JVD GI: BS+, soft, nontender Extremities: 2+ pitting edema,-tenderness Neuro: Awake, alert, follow commands, moves extremities x 4 GU: Foley in place  Resolved Hospital Problem list    Assessment & Plan:  63 year old female with morbid obesity, chronic respiratory failure 2/2 to COPD/OSA/OHS, acute heart failure, AF and DM2 transferred to ICU for acute on chronic hypercarbic and hypoxemic respiratory failure requiring intubation.  Acute on chronic hypoxic/hypercapnic respiratory failure Likely multifactorial in setting of known COPD/OSA/OHS and acute heart failure COVID-19 negative CFB negative this admission Plan Plan to extubate Bronchodilators Hold diuresis today for low-normal pressures F/u trach asp and blood cultures CAP coverage. Monitor QTc (patient on azithro and nortryptyline)  Permanent atrial fibrillation Acute on chronic diastolic CHF TTE 5/7 EF 50-03% , decreased  RV systolic fxn , RV severe dilation  TTE 5/15 EF 60-65%, reduced RV systolic fxn, mild RV enlargement, moderate biatrial enlargement Plan  Telemetry   PO diltiazem 60mg  q8h    Not candidate for oral anticoagulation reportedly due to chronic thrombocytopenia   AoCKD Plan Monitor UOP/Cr  Hypokalemia Plan Repleted  DM type II Hypothyroidism Plan SSI  Continue synthroid  Anemia, thrombocytopenia of chronic disease and iron deficiency. Plan Trend CBC Holding anticoagulation/DVT ppx. On SCDs   Hx of depression, chronic pain. Plan On home nortryptyline  Best practice:  Diet: TF  DVT prophylaxis: SCDs GI prophylaxis: Pepcid  Mobility: BR  Code Status: full Disposition: ICU   Labs    CMP Latest Ref Rng & Units 10/01/2018 09/30/2018 09/29/2018  Glucose 70 - 99 mg/dL 153(H) 130(H) -  BUN 8 - 23 mg/dL 76(H) 71(H) -  Creatinine 0.44 - 1.00 mg/dL 1.25(H) 1.42(H) -  Sodium 135 - 145 mmol/L 140 138 134(L)  Potassium 3.5 - 5.1 mmol/L 3.4(L) 4.7 4.9  Chloride 98 - 111 mmol/L 85(L) 84(L) -  CO2 22 - 32 mmol/L 47(H) 46(H) -  Calcium 8.9 - 10.3 mg/dL 8.5(L) 8.7(L) -  Total Protein 6.5 - 8.1 g/dL - - -  Total Bilirubin 0.3 - 1.2 mg/dL - - -  Alkaline Phos 38 - 126 U/L - - -  AST 15 - 41 U/L - - -  ALT 0 - 44 U/L - - -   CBC Latest Ref Rng & Units 10/01/2018 09/30/2018 09/29/2018  WBC 4.0 - 10.5 K/uL 7.8 6.8 -  Hemoglobin 12.0 - 15.0 g/dL 8.2(L) 7.8(L) 9.5(L)  Hematocrit 36.0 - 46.0 % 28.7(L) 27.4(L) 28.0(L)  Platelets 150 - 400 K/uL 94(L) 101(L) -   CBG (last 3)  Recent Labs    09/30/18 2335 10/01/18 0329 10/01/18 0806  GLUCAP 171* 160* 135*   ABG    Component Value Date/Time   PHART 7.546 (H) 09/29/2018 1853   PCO2ART 58.1 (H) 09/29/2018 1853   PO2ART 133.0 (H) 09/29/2018 1853   HCO3 50.3 (H) 09/29/2018 1853   TCO2 >50 (H) 09/29/2018 1853   O2SAT 99.0 09/29/2018 1853   Critical Care: 32 min  The patient is critically ill with multiple organ systems failure and requires high complexity decision making for assessment and support, frequent evaluation and titration of therapies, application of advanced  monitoring technologies and extensive interpretation of multiple databases.   Critical Care Time devoted to patient care services described in this note is 32 Minutes. This time reflects time of care of this signee Dr. Rodman Pickle. This critical care time does not reflect procedure time, or teaching time or supervisory time of PA/NP/Med student/Med Resident etc but could involve care discussion time.  Rodman Pickle, M.D. [facility] Pulmonary/Critical Care Medicine Pager: [phone] After hours pager: [phone]

## 2018-10-02 DIAGNOSIS — I5032 Chronic diastolic (congestive) heart failure: Secondary | ICD-10-CM

## 2018-10-02 DIAGNOSIS — R911 Solitary pulmonary nodule: Secondary | ICD-10-CM

## 2018-10-02 DIAGNOSIS — Z6841 Body Mass Index (BMI) 40.0 and over, adult: Secondary | ICD-10-CM

## 2018-10-02 DIAGNOSIS — G4733 Obstructive sleep apnea (adult) (pediatric): Secondary | ICD-10-CM

## 2018-10-02 DIAGNOSIS — D61818 Other pancytopenia: Secondary | ICD-10-CM

## 2018-10-02 DIAGNOSIS — C642 Malignant neoplasm of left kidney, except renal pelvis: Secondary | ICD-10-CM

## 2018-10-02 LAB — CULTURE, RESPIRATORY W GRAM STAIN: Special Requests: NORMAL

## 2018-10-02 LAB — BASIC METABOLIC PANEL
Anion gap: 9 (ref 5–15)
BUN: 64 mg/dL — ABNORMAL HIGH (ref 8–23)
CO2: 47 mmol/L — ABNORMAL HIGH (ref 22–32)
Calcium: 8.4 mg/dL — ABNORMAL LOW (ref 8.9–10.3)
Chloride: 84 mmol/L — ABNORMAL LOW (ref 98–111)
Creatinine, Ser: 1.04 mg/dL — ABNORMAL HIGH (ref 0.44–1.00)
GFR calc Af Amer: >60 mL/min (ref >60)
GFR calc non Af Amer: 56 mL/min — ABNORMAL LOW (ref >60)
Glucose, Bld: 107 mg/dL — ABNORMAL HIGH (ref 70–99)
Potassium: 3.6 mmol/L (ref 3.5–5.1)
Sodium: 140 mmol/L (ref 135–145)

## 2018-10-02 LAB — CBC
HCT: 30.4 % — ABNORMAL LOW (ref 36.0–46.0)
Hemoglobin: 8.4 g/dL — ABNORMAL LOW (ref 12.0–15.0)
MCH: 24.9 pg — ABNORMAL LOW (ref 26.0–34.0)
MCHC: 27.6 g/dL — ABNORMAL LOW (ref 30.0–36.0)
MCV: 89.9 fL (ref 80.0–100.0)
Platelets: 87 10*3/uL — ABNORMAL LOW (ref 150–400)
RBC: 3.38 MIL/uL — ABNORMAL LOW (ref 3.87–5.11)
RDW: 20.7 % — ABNORMAL HIGH (ref 11.5–15.5)
WBC: 7.8 10*3/uL (ref 4.0–10.5)
nRBC: 0 % (ref 0.0–0.2)

## 2018-10-02 LAB — GLUCOSE, CAPILLARY
Glucose-Capillary: 100 mg/dL — ABNORMAL HIGH (ref 70–99)
Glucose-Capillary: 111 mg/dL — ABNORMAL HIGH (ref 70–99)
Glucose-Capillary: 111 mg/dL — ABNORMAL HIGH (ref 70–99)
Glucose-Capillary: 143 mg/dL — ABNORMAL HIGH (ref 70–99)
Glucose-Capillary: 157 mg/dL — ABNORMAL HIGH (ref 70–99)
Glucose-Capillary: 94 mg/dL (ref 70–99)
Glucose-Capillary: 94 mg/dL (ref 70–99)

## 2018-10-02 LAB — PHOSPHORUS: Phosphorus: 3.6 mg/dL (ref 2.5–4.6)

## 2018-10-02 LAB — HEMOGLOBIN A1C
Hgb A1c MFr Bld: 5.3 % (ref 4.8–5.6)
Mean Plasma Glucose: 105.41 mg/dL

## 2018-10-02 LAB — MAGNESIUM: Magnesium: 1.8 mg/dL (ref 1.7–2.4)

## 2018-10-02 MED ORDER — ENSURE MAX PROTEIN PO LIQD
11.0000 [oz_av] | Freq: Two times a day (BID) | ORAL | Status: DC
  Administered 2018-10-02 – 2018-10-03 (×3): 11 [oz_av] via ORAL
  Filled 2018-10-02 (×5): qty 330

## 2018-10-02 MED ORDER — RESOURCE THICKENUP CLEAR PO POWD
ORAL | Status: DC | PRN
  Filled 2018-10-02: qty 125

## 2018-10-02 MED ORDER — AZITHROMYCIN 250 MG PO TABS
250.0000 mg | ORAL_TABLET | Freq: Every day | ORAL | Status: DC
  Administered 2018-10-02 – 2018-10-03 (×2): 250 mg via ORAL
  Filled 2018-10-02 (×2): qty 1

## 2018-10-02 MED ORDER — ADULT MULTIVITAMIN W/MINERALS CH
1.0000 | ORAL_TABLET | Freq: Every day | ORAL | Status: DC
  Administered 2018-10-03: 1 via ORAL
  Filled 2018-10-02: qty 1

## 2018-10-02 MED ORDER — FUROSEMIDE 40 MG PO TABS
40.0000 mg | ORAL_TABLET | Freq: Two times a day (BID) | ORAL | Status: DC
  Administered 2018-10-03: 40 mg via ORAL
  Filled 2018-10-02 (×2): qty 1

## 2018-10-02 NOTE — Progress Notes (Signed)
Nutrition Follow-up  RD working remotely.  DOCUMENTATION CODES:   Morbid obesity  INTERVENTION:   -Ensure Max po BID, each supplement provides 150 kcal and 30 grams of protein.  -MVI with minerals daily  NUTRITION DIAGNOSIS:   Increased nutrient needs related to chronic illness, wound healing as evidenced by estimated needs.  Ongoing  GOAL:   Patient will meet greater than or equal to 90% of their needs  Progressing  MONITOR:   PO intake, Supplement acceptance, Diet advancement, Labs, Weight trends, Skin, I & O's  REASON FOR ASSESSMENT:   Ventilator Enteral/tube feeding initiation and management  ASSESSMENT:   63 yo female admitted with acute on chronic respiratory failure with acute on chronic CHF, hyperkalemia, AKI on CKD III on 5/07. Recent hospitalization for the same.This admission, pt with newly identified mass of the head of the pancreas and right kidney mass.  Pt developed worsening respiratory failure with AMS requiring intubation on 5/15. PMH includes COPD, CHF, OSA, PAF, DM, CKD  5/7 Admit 5/13 CT abdomen with 10.7 cm solid mass right kidney, 4.4 cm pancreatic head mass, hepatic cirrhosis, mild ascites, diffuse mesenteric and body wall edema 5/14 Renal Biopsy performed 5/15 Intubated; PICC placed 5/17- extubated, s/p BSE- recommending NPO 5/18- s/p BSE- advanced to dysphagia 3 diet with nectar thick liquids  Reviewed I/O's: -1.9 L x 24 hours and -13.1 L since admission  UOP: 2.3 L x 24 hours  Per MD notes, pt with kidney and pancreatic mass, which pt is aware is likely cancer. Palliative care has been consulted for goals of care discussions  Pt just advanced to a diet today. Noted meal completion 50-80%. Pt with increased nutritional needs due to wound healing and would benefit from addition of nutritional supplements.   Labs reviewed: Mg, K, and Phos WDL.  CBGS: 94-100 (inpatient orders for glycemic control are 3-9 units insulin aspart every 4 hours  and 10 units insulin detemir BID).   Diet Order:   Diet Order            DIET DYS 3 Room service appropriate? Yes; Fluid consistency: Nectar Thick  Diet effective now              EDUCATION NEEDS:   Not appropriate for education at this time  Skin:  Skin Assessment: Skin Integrity Issues: Skin Integrity Issues:: Other (Comment), Incisions, Stage II Stage II: buttocks, ischial tuberosity Incisions: close abdomen Other: lt lower distal arm wound  Last BM:  10/02/18  Height:   Ht Readings from Last 1 Encounters:  09/24/18 5\' 4"  (1.626 m)    Weight:   Wt Readings from Last 1 Encounters:  10/02/18 (!) 137.3 kg    Ideal Body Weight:  54.5 kg  BMI:  Body mass index is 51.96 kg/m.  Estimated Nutritional Needs:   Kcal:  1700-1900  Protein:  110-125 grams  Fluid:  > 1.7 L    Talesha Ellithorpe A. Jimmye Norman, RD, LDN, [place] Registered Dietitian II Certified Diabetes Care and Education Specialist Pager: [phone] After hours Pager: [phone]

## 2018-10-02 NOTE — Evaluation (Signed)
Physical Therapy Evaluation Patient Details Name: Renee Noble MRN: [id number] DOB: [birth year] Today's Date: 10/02/2018   History of Present Illness  63 year female with extensive history significant for but not limited to chronic hypoxic respiratory failure on 3 L oxygen at home, COPD, OHS, OSA, chronic diastolic CHF, PAF - not on AC given chronic thrombocytopenia/ recent anemia, diabetes type 2, and obesity presenting from rehab/ [facility] for altered mental status and hyperkalemia.  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 intubated 09/29/18-10/01/18. 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    Clinical Impression  Patient received in bed and agreeable to therapies. Patient requiring Max-Total A +2 for bed level mobility (supine to/from sit at EOB). PT/OT able to bring patient to EOB with patient requiring Max A to maintain upright posturing at EOB for ~2-3 min. Primary limiting factor B LE pain. PT to continue to follow patient acutely to progress functional mobility as able. Will recommend SNF at discharge.      Follow Up Recommendations SNF;Supervision/Assistance - 24 hour    Equipment Recommendations  Other (comment)(TBD)    Recommendations for Other Services       Precautions / Restrictions Precautions Precautions: Fall Precaution Comments: watch vitals Restrictions Weight Bearing Restrictions: No      Mobility  Bed Mobility Overal bed mobility: Needs Assistance Bed Mobility: Supine to Sit;Sit to Supine     Supine to sit: Max assist;Total assist;+2 for physical assistance Sit to supine: Max assist;Total assist;+2 for physical assistance   General bed mobility comments: required physical assist to move LE towards EOB; Max/Total A +2 with sue of bed pad to sit EOB; patient tolerable to ~2-3 min with LE pain/discomfort limiting; Max A for seated  balance EOB  Transfers                 General transfer comment: deferred  Ambulation/Gait                Stairs            Wheelchair Mobility    Modified Rankin (Stroke Patients Only)       Balance Overall balance assessment: Needs assistance Sitting-balance support: Bilateral upper extremity supported;Feet supported Sitting balance-Leahy Scale: Poor Sitting balance - Comments: Max A to maintain EOB balance, c/o B LE pain Postural control: Posterior lean                                   Pertinent Vitals/Pain Pain Assessment: Faces Faces Pain Scale: Hurts even more Pain Location: B LE, generalized Pain Descriptors / Indicators: Aching;Discomfort;Grimacing;Guarding Pain Intervention(s): Limited activity within patient's tolerance;Monitored during session;Repositioned;Patient requesting pain meds-RN notified    Home Living Family/patient expects to be discharged to:: Skilled nursing facility                      Prior Function Level of Independence: Needs assistance   Gait / Transfers Assistance Needed: per pt staff was using lift to transfer pt to chair and EOB, she only stood one time with therapy  ADL's / Homemaking Assistance Needed: dependent on staff        Hand Dominance        Extremity/Trunk Assessment   Upper Extremity Assessment Upper Extremity Assessment: Defer to  OT evaluation    Lower Extremity Assessment Lower Extremity Assessment: Generalized weakness    Cervical / Trunk Assessment Cervical / Trunk Assessment: Other exceptions Cervical / Trunk Exceptions: Increased body habitus  Communication   Communication: No difficulties(soft spoken)  Cognition Arousal/Alertness: Awake/alert Behavior During Therapy: WFL for tasks assessed/performed Overall Cognitive Status: Within Functional Limits for tasks assessed                                        General Comments General  comments (skin integrity, edema, etc.): VSS throughout session    Exercises     Assessment/Plan    PT Assessment Patient needs continued PT services  PT Problem List Decreased strength;Decreased activity tolerance;Decreased balance;Decreased mobility;Decreased knowledge of use of DME;Decreased safety awareness       PT Treatment Interventions DME instruction;Gait training;Functional mobility training;Therapeutic activities;Therapeutic exercise;Patient/family education;Balance training    PT Goals (Current goals can be found in the Care Plan section)  Acute Rehab PT Goals Patient Stated Goal: reduce pain PT Goal Formulation: With patient Time For Goal Achievement: 10/16/18 Potential to Achieve Goals: Good    Frequency Min 2X/week   Barriers to discharge        Co-evaluation PT/OT/SLP Co-Evaluation/Treatment: Yes Reason for Co-Treatment: Complexity of the patient's impairments (multi-system involvement);For patient/therapist safety;To address functional/ADL transfers PT goals addressed during session: Mobility/safety with mobility;Strengthening/ROM         AM-PAC PT "6 Clicks" Mobility  Outcome Measure Help needed turning from your back to your side while in a flat bed without using bedrails?: Total Help needed moving from lying on your back to sitting on the side of a flat bed without using bedrails?: Total Help needed moving to and from a bed to a chair (including a wheelchair)?: Total Help needed standing up from a chair using your arms (e.g., wheelchair or bedside chair)?: Total Help needed to walk in hospital room?: Total Help needed climbing 3-5 steps with a railing? : Total 6 Click Score: 6    End of Session Equipment Utilized During Treatment: Oxygen Activity Tolerance: Patient limited by pain Patient left: in bed;with call bell/phone within reach;with bed alarm set Nurse Communication: Mobility status PT Visit Diagnosis: Unsteadiness on feet (R26.81);Other  abnormalities of gait and mobility (R26.89);Muscle weakness (generalized) (M62.81)    Time: 4854-6270 PT Time Calculation (min) (ACUTE ONLY): 16 min   Charges:   PT Evaluation $PT Eval Moderate Complexity: 1 Mod          Lanney Gins, PT, DPT Supplemental Physical Therapist 10/02/18 10:14 AM Pager: [phone] Office: [phone]

## 2018-10-02 NOTE — Progress Notes (Signed)
Pt seen for re-eval post intubation. Pt requiring max-totalA+2 for bed mobility, tolerating sitting EOB only approx 34min with maxA for sitting balance, limited due to pain, significant weakness and decreased activity tolerance. Pt currently requires totalA for LB ADL, maxA for UB ADL; encouraged pt to participate in ADL with nursing staff as much as she can to also aide in increasing strength/endurance and pt verbalizing understanding. She will benefit from continued acute OT services and recommend follow up therapy services in SNF setting after discharge to maximize her safety and independence with ADL and mobility. Will follow.      10/02/18 1059  OT Visit Information  Last OT Received On 10/02/18  Assistance Needed +2  PT/OT/SLP Co-Evaluation/Treatment Yes  Reason for Co-Treatment Complexity of the patient's impairments (multi-system involvement);For patient/therapist safety;To address functional/ADL transfers  History of Present Illness 63 year female with extensive history significant for but not limited to chronic hypoxic respiratory failure on 3 L oxygen at home, COPD, OHS, OSA, chronic diastolic CHF, PAF - not on AC given chronic thrombocytopenia/ recent anemia, diabetes type 2, and obesity presenting from rehab/ [facility] for altered mental status and hyperkalemia.  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 intubated 09/29/18-10/01/18. 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  Precautions  Precautions Fall  Precaution Comments watch vitals  Restrictions  Weight Bearing Restrictions No  Home Living  Family/patient expects to be discharged to: Skilled nursing facility  Prior Function  Level of Independence Needs assistance  Gait / Transfers Assistance Needed per pt staff was using lift to transfer pt to chair and EOB, she only stood one time with therapy  ADL's /  [place] Needed dependent on staff  Communication  Communication No difficulties (soft spoken)  Pain Assessment  Pain Assessment Faces  Faces Pain Scale 6  Pain Location B LE, generalized  Pain Descriptors / Indicators Aching;Discomfort;Grimacing;Guarding  Pain Intervention(s) Limited activity within patient's tolerance;Monitored during session;Repositioned;Patient requesting pain meds-RN notified  Cognition  Arousal/Alertness Awake/alert  Behavior During Therapy WFL for tasks assessed/performed  Overall Cognitive Status Within Functional Limits for tasks assessed  Upper Extremity Assessment  Upper Extremity Assessment Generalized weakness  Lower Extremity Assessment  Lower Extremity Assessment Defer to PT evaluation  Cervical / Trunk Assessment  Cervical / Trunk Assessment Other exceptions  Cervical / Trunk Exceptions Increased body habitus  ADL  Overall ADL's  Needs assistance/impaired  Eating/Feeding Set up;Supervision/ safety;Bed level  Grooming Set up;Supervision/safety;Bed level  Upper Body Bathing Maximal assistance;Bed level  Lower Body Bathing Total assistance  Upper Body Dressing  Maximal assistance;Bed level  Lower Body Dressing Total assistance;Bed level  Toileting- Clothing Manipulation and Hygiene Total assistance;Bed level;+2 for physical assistance  Functional mobility during ADLs Total assistance;+2 for physical assistance;+2 for safety/equipment (bed mobility)  General ADL Comments pt only able to tolerate sitting EOB for brief period of time (1-2 min) with maxA  Bed Mobility  Overal bed mobility Needs Assistance  Bed Mobility Supine to Sit;Sit to Supine  Supine to sit Max assist;Total assist;+2 for physical assistance  Sit to supine Max assist;Total assist;+2 for physical assistance  General bed mobility comments required physical assist to move LE towards EOB; Max/Total A +2 with sue of bed pad to sit EOB; patient tolerable to ~2-3 min with LE  pain/discomfort limiting; Max A for seated balance EOB  Transfers  General transfer comment deferred  Balance  Overall balance assessment Needs assistance  Sitting-balance support Bilateral upper extremity supported;Feet supported  Sitting balance-Leahy Scale Poor  Sitting balance - Comments Max A to maintain EOB balance, c/o B LE pain  Postural control Posterior lean  General Comments  General comments (skin integrity, edema, etc.) VSS  OT - End of Session  Equipment Utilized During Treatment Oxygen  Activity Tolerance Patient limited by fatigue;Patient limited by pain  Patient left in bed;with call bell/phone within reach  Nurse Communication Mobility status  OT Assessment  OT Recommendation/Assessment Patient needs continued OT Services  OT Visit Diagnosis Muscle weakness (generalized) (M62.81);Adult, failure to thrive (R62.7);Pain  Pain - part of body Leg;Ankle and joints of foot (bil)  OT Problem List Decreased strength;Decreased range of motion;Decreased activity tolerance;Impaired balance (sitting and/or standing);Pain;Obesity;Cardiopulmonary status limiting activity;Decreased knowledge of use of DME or AE  OT Plan  OT Frequency (ACUTE ONLY) Min 2X/week  OT Treatment/Interventions (ACUTE ONLY) Self-care/ADL training;Therapeutic exercise;Neuromuscular education;Energy conservation;DME and/or AE instruction;Therapeutic activities;Patient/family education;Balance training  AM-PAC OT "6 Clicks" Daily Activity Outcome Measure (Version 2)  Help from another person eating meals? 3  Help from another person taking care of personal grooming? 2  Help from another person toileting, which includes using toliet, bedpan, or urinal? 1  Help from another person bathing (including washing, rinsing, drying)? 2  Help from another person to put on and taking off regular upper body clothing? 1  Help from another person to put on and taking off regular lower body clothing? 1  6 Click Score 10  OT  Recommendation  Follow Up Recommendations SNF  OT Equipment Other (comment) (TBA)  Individuals Consulted  Consulted and Agree with Results and Recommendations Patient  Acute Rehab OT Goals  Patient Stated Goal reduce pain  OT Goal Formulation With patient  Time For Goal Achievement 10/16/18  Potential to Achieve Goals Fair  OT Time Calculation  OT Start Time (ACUTE ONLY) 7482  OT Stop Time (ACUTE ONLY) 0848  OT Time Calculation (min) 16 min  OT General Charges  $OT Visit 1 Visit  OT Evaluation  $OT Re-eval 1 Re-eval   Lou Cal, OT Supplemental Rehabilitation Services Pager [phone] Office [phone]

## 2018-10-02 NOTE — Progress Notes (Signed)
  Speech Language Pathology Treatment: Dysphagia  Patient Details Name: Renee Noble MRN: [id number] DOB: [birth year] Today's Date: 10/02/2018 Time: 3428-7681 SLP Time Calculation (min) (ACUTE ONLY): 23 min  Assessment / Plan / Recommendation Clinical Impression  Pt demonstrates consistent cough after singles sips and significant cough after 3 0z water swallow. Vocal quality is still mildly hoarse. Coughing concerning for ongoing dysphagia, possibly acute in nature following recent intubation. Trials of nectar thick liquids elicited no coughing and pt tolerated solids well. Recommend initiating mech soft diet with nectar thick liquids. If coughing persists may need to consider objective testing though this may be difficult due to pts body habitus.   HPI HPI: Patient is a 63 y.o. female with PMH: fibromyalgia, CHF, nephrectomy, chronic hypoxic respiratory failure on 3 L continuous nasal cannula O2, obesity, DM-2, chronic diastolic heart failure admitted from SNF on 5/6 with AMS, acute on chronic hypercapnia with acidosis and hyperkalemia.  Found to have newly identified 4.7 cm mass of the head of the pancreas and a right kidney mass. Transferred to ICU for AHRF requiring intubation 5/15-5/17.  Recent hospitalization 4/13-5/2 for acute on chronic respiratory failure requiring intubation x2.  Pt was followed by SLP during last admission for dysphagia.       SLP Plan  Continue with current plan of care       Recommendations  Diet recommendations: Dysphagia 3 (mechanical soft);Nectar-thick liquid Liquids provided via: Cup;Straw Medication Administration: Whole meds with puree Supervision: Patient able to self feed Compensations: Slow rate;Small sips/bites Postural Changes and/or Swallow Maneuvers: Seated upright 90 degrees                Oral Care Recommendations: Oral care BID Follow up Recommendations: Other (comment) Plan: Continue with current plan of care       GO              Herbie Baltimore, MA [place] Pager [phone] Office [phone]  Lynann Beaver 10/02/2018, 9:51 AM

## 2018-10-02 NOTE — Progress Notes (Signed)
PROGRESS NOTE  Renee Noble UUV:[id number] DOB: Jan 30, 1956 DOA: 09/24/2018 PCP: [name], PA-C   LOS: 12 days   [facility] is from: [facility]  Brief Narrative / Interim history: 63 year old female with history of chronic hypoxic and hypercarbic respiratory failure on 4 L oxygen, COPD, OHS, OSA on CPAP, chronic diastolic CHF, paroxysmal atrial fibrillation, DM-2, thrombocytopenia, anemia of chronic disease admitted on 5/6 with altered mental status and hyperkalemia found to be on acute on chronic respiratory failure.  COVID-19 is negative.  In ED, afebrile, normotensive, tachypneic with altered mental status.  ABG with compensated respiratory acidosis and hypoxemia.  Potassium 7.2.  Serum creatinine 1.6.  Hemoglobin 8.1.  No leukocytosis.  Platelet 92.  BNP 1090.  EKG with A. fib and rate of 87.  She was treated with Lasix and placed on BiPAP.  Hyperkalemia was treated with temporizing measures.  She was admitted to ICU.   In ICU, she was continued on BiPAP, breathing treatments and diuretics.  She was transferred to [facility] on 5/9.  She had a CT abdomen that showed pancreatic and renal mass.  She had renal biopsy now shows renal cell carcinoma.  Oncology was consulted but did not feel she could be a good candidate for aggressive intervention about the malignancies.  Gastroenterology was also consulted about the pancreatic mass for potential biopsy.  However, patient had worsening encephalopathy on 5/15 and moved back to ICU and got intubated.  Patient was extubated on 5/17 and [facility] service assumed care on 5/18.  Subjective: No major events overnight on this morning.  Currently saturating well on 3 L by nasal cannula.  Reports improvement in her breathing.  She is asking about when she can eat.  Denies chest pain.  Aware of the pancreatic mass and the kidney mass which could potentially be a cancer.   Assessment & Plan: Acute on chronic hypoxic/hypercapnic respiratory  failure-improving. Severe COPD exacerbation-improved OSA/OHS-on nightly CPAP -Currently on home level oxygen. -Started on ceftriaxone and azithromycin on 5/16 for CAP coverage. -ETT 5/15-5/17. -Continue PRN DuoNeb, scheduled Dulera -Has already completed steroid course. -Avoid sedating medications -Continue nightly CPAP.  HFpEF Exacerbation:  -TTE 5/7 EF 50-55% , decreased RV systolic fxn , RV severe dilation  -TTE 5/15 EF 60-65%, reduced RV systolic fxn, mild RV enlargement, moderate biatrial  -BNP ~1090.4.  CXR and CT chest concerning for pulmonary congestion. -Currently appears euvolemic.  On home level oxygen. -Restart home Lasix. -Daily weight, intake output and renal function. -Monitor electrolytes closely and replenish as appropriate.  Permanent atrial fibrillation: Rate controlled.  Not on anticoagulation likely due to thrombocytopenia. -Continue home diltiazem  Renal cell carcinoma/pancreatic mass/RUL pulmonary nodule:  -Evaluated by oncology-likely not a candidate for aggressive intervention. -Oncology recommended MRI versus EUS biopsy for the pancreatic mass given her comorbidities but she did not tolerate MRI -Renal inferior pole mass concerning for malignancy with suggestion of extension of tumor thrombus into R renal vein and possibly IVC -Palliative care consult.  Pancytopenia with anemia and thrombocytopenia: Stable. -s/p granix; monitor CBC  IDDM-2 with renal complication.  CBG fairly controlled. -Check A1c -CBG monitoring -Continue Lantus and SSI insulin.  Hypothyroidism -Continue Synthroid.  Hx of depression/chronic pain -scheduled APAP, voltaren, lidocaine patch, nortriptyline  -Holding home opiate.  Acute toxic and metabolic encephalopathy: likely a combination of sedating medications and respiratory failure -Resolved.  Deconditioning. -PT/OT -recommending   Wound type: CVI, moisture associated skin damage, healed full thickness pressure  injury to left ischial tuberosity Pressure Injury  POA: Yes Measurement: Left IT:  3cm x 2.5cm newly reepithelialized Left lateral LE: Healed VLU. Staff noted weeping at area near knee today, no longer weeping. Right medial LE: 3cm x 2.5cm dry, crusted ulcer. Unable to assess depth due to the presence of dried serum (scab) vs eschar Intertriginous skin loss at the gluteal cleft: partial thickness two area measuring 3cm x 0.4cm x 0.2cm with pink, moist wound bed. Scant serous to serosanguinous exudate. Wound bed:As described above -Appreciate wound care input.  Morbid obesity BMI 52.  Multiple comorbidities -Appreciate nutrition input.  Goal of care: patient with multiple comorbidities now with renal cell carcinoma and possible pancreatic cancer.  Recurrent intubations.  Remains full code. -Palliative care consult for goals of care discussion  Scheduled Meds: . acetaminophen  650 mg Oral Q6H  . ammonium lactate   Topical BID  . chlorhexidine gluconate (MEDLINE KIT)  15 mL Mouth Rinse BID  . Chlorhexidine Gluconate Cloth  6 each Topical Daily  . diclofenac sodium  2 g Topical QID  . diltiazem  60 mg Oral Q8H  . famotidine  20 mg Oral BID  . insulin aspart  3-9 Units Subcutaneous Q4H  . insulin detemir  10 Units Subcutaneous BID  . levothyroxine  88 mcg Oral Q0600  . lidocaine  1 patch Transdermal Q24H  . mometasone-formoterol  2 puff Inhalation BID  . nortriptyline  25 mg Oral QHS  . polyethylene glycol  17 g Oral BID  . pravastatin  20 mg Oral Daily  . senna-docusate  2 tablet Oral BID  . sodium chloride flush  10-40 mL Intracatheter Q12H   Continuous Infusions: . sodium chloride 10 mL/hr at 10/01/18 1500  . azithromycin Stopped (10/01/18 1209)  . cefTRIAXone (ROCEPHIN)  IV Stopped (10/01/18 1053)  . norepinephrine (LEVOPHED) Adult infusion Stopped (10/01/18 0831)   PRN Meds:.sodium chloride, albuterol, bisacodyl, dextromethorphan-guaiFENesin, ipratropium-albuterol, lactulose,  metoprolol tartrate, ondansetron (ZOFRAN) IV, sodium chloride flush, sodium chloride flush   DVT prophylaxis: SCD due to thrombocytopenia Code Status: Full code Family Communication: None at bedside.  Disposition Plan: Remains inpatient on IV antibiotic.  Final disposition likely SNF after PT/OT/palliative care input.  Consultants:   PCCM  Nephrology on 5/7  Oncology on 5/11  Gastroenterology on 5/13  IR on 5/14  Palliative care on 5/18  Procedures:   EGD 5/15-5/17  CT-guided renal biopsy on 5/14  Microbiology: 5/6 BC x 2 >>NEG x  5/6 BC -Staph species (coag neg )  5/6 COVID >> neg 5/16 Moderate gram variable rod, final pending  Antimicrobials: Anti-infectives (From admission, onward)   Start     Dose/Rate Route Frequency Ordered Stop   09/30/18 1100  cefTRIAXone (ROCEPHIN) 2 g in sodium chloride 0.9 % 100 mL IVPB     2 g 200 mL/hr over 30 Minutes Intravenous Every 24 hours 09/30/18 1020 10/05/18 1059   09/30/18 1100  azithromycin (ZITHROMAX) 250 mg in dextrose 5 % 125 mL IVPB     250 mg 125 mL/hr over 60 Minutes Intravenous Every 24 hours 09/30/18 1020 10/05/18 1059       Objective: Vitals:   10/02/18 0400 10/02/18 0500 10/02/18 0520 10/02/18 0600  BP:   (!) 127/59 (!) 128/49  Pulse: 93 100  (!) 101  Resp: _0 Temp:      TempSrc:      SpO2: 98% 99%  99%  Weight:      Height:        Intake/Output Summary (Last  24 hours) at 10/02/2018 0700 Last data filed at 10/02/2018 0300 Gross per 24 hour  Intake 422.32 ml  Output 2325 ml  Net -1902.68 ml   Filed Weights   09/30/18 0405 10/01/18 0359 10/02/18 0300  Weight: 135.4 kg 133.4 kg (!) 137.3 kg    Examination:  GENERAL: No acute distress.  Appears well.  HEENT: MMM.  Vision and hearing grossly intact.  NECK: Supple.  Difficult to assess JVD due to body habitus. LUNGS:  No IWOB.  Fair air movement but limited exam due to body habitus. HEART: Irregular rhythm.  Normal rate. Heart sounds  normal.  ABD: Bowel sounds present. Soft. Non tender.  MSK/EXT:  Moves all extremities. No apparent deformity.  Bilateral venous insufficiency SKIN: Pressure skin injuries as above NEURO: Awake, alert and oriented appropriately.  No gross deficit.  PSYCH: Calm. Normal affect.   Data Reviewed: I have independently reviewed following labs and imaging studies  CBC: Recent Labs  Lab 09/27/18 0429 09/27/18 0724 09/28/18 0420 09/29/18 0423 09/29/18 1537 09/29/18 1853 09/30/18 0357 10/01/18 0345  WBC 5.8 6.4 8.1 9.5  --   --  6.8 7.8  NEUTROABS 4.9 5.5 7.0 8.8*  --   --  5.3  --   HGB 8.4* 8.5* 8.7* 8.8* 10.2* 9.5* 7.8* 8.2*  HCT 29.4* 30.1* 31.5* 32.6* 30.0* 28.0* 27.4* 28.7*  MCV 86.7 86.7 90.5 92.1  --   --  87.8 87.0  PLT 83* 83* 101* 121*  --   --  101* 94*   Basic Metabolic Panel: Recent Labs  Lab 09/27/18 0429 09/29/18 0423 09/29/18 1314 09/29/18 1537 09/29/18 1629 09/29/18 1853 09/30/18 0357 10/01/18 0345  NA 139 138 138 134*  --  134* 138 140  K 3.3* 5.2* 5.3* 5.6*  --  4.9 4.7 3.4*  CL 81* 82* 83*  --   --   --  84* 85*  CO2 45* 48* 43*  --   --   --  46* 47*  GLUCOSE 100* 128* 92  --   --   --  130* 153*  BUN 64* 64* 67*  --   --   --  71* 76*  CREATININE 1.19* 1.48* 1.39*  --   --   --  1.42* 1.25*  CALCIUM 8.8* 9.2 9.2  --   --   --  8.7* 8.5*  MG 1.8 1.9  --   --  2.2  --  1.9 1.8  PHOS  --  4.8*  --   --  3.0  --  2.9 2.7   GFR: Estimated Creatinine Clearance: 62.1 mL/min (A) (by C-G formula based on SCr of 1.25 mg/dL (H)). Liver Function Tests: Recent Labs  Lab 09/25/18 0821 09/26/18 0539 09/27/18 0429 09/29/18 0423  AST _0 --   ALT 34 28 24  --   ALKPHOS 129* 115 107  --   BILITOT 0.7 0.7 1.0  --   PROT 5.0* 5.0* 5.4*  --   ALBUMIN 3.0* 2.9* 2.9* 3.1*   No results for input(s): LIPASE, AMYLASE in the last 168 hours. No results for input(s): AMMONIA in the last 168 hours. Coagulation Profile: Recent Labs  Lab 09/27/18 1634  INR  1.1   Cardiac Enzymes: No results for input(s): CKTOTAL, CKMB, CKMBINDEX, TROPONINI in the last 168 hours. BNP (last 3 results) No results for input(s): PROBNP in the last 8760 hours. HbA1C: No results for input(s): HGBA1C in the last 72 hours. CBG: Recent Labs  Lab 10/01/18 0806 10/01/18 1225 10/01/18 1608 10/02/18 0028 10/02/18 0413  GLUCAP 135* 116* 117* 111* 94   Lipid Profile: No results for input(s): CHOL, HDL, LDLCALC, TRIG, CHOLHDL, LDLDIRECT in the last 72 hours. Thyroid Function Tests: No results for input(s): TSH, T4TOTAL, FREET4, T3FREE, THYROIDAB in the last 72 hours. Anemia Panel: No results for input(s): VITAMINB12, FOLATE, FERRITIN, TIBC, IRON, RETICCTPCT in the last 72 hours. Urine analysis:    Component Value Date/Time   COLORURINE YELLOW 08/29/2018 0950   APPEARANCEUR CLEAR 08/29/2018 0950   LABSPEC 1.014 08/29/2018 0950   PHURINE 5.0 08/29/2018 0950   GLUCOSEU NEGATIVE 08/29/2018 0950   GLUCOSEU NEGATIVE 08/17/2016 1340   HGBUR SMALL (A) 08/29/2018 0950   BILIRUBINUR NEGATIVE 08/29/2018 0950   BILIRUBINUR neg 01/15/2015 1353   KETONESUR NEGATIVE 08/29/2018 0950   PROTEINUR 30 (A) 08/29/2018 0950   UROBILINOGEN 1.0 08/17/2016 1340   NITRITE NEGATIVE 08/29/2018 0950   LEUKOCYTESUR NEGATIVE 08/29/2018 0950   Sepsis Labs: Invalid input(s): PROCALCITONIN, LACTICIDVEN  Recent Results (from the past 240 hour(s))  Culture, blood (routine x 2)     Status: None (Preliminary result)   Collection Time: 09/30/18 11:24 AM  Result Value Ref Range Status   Specimen Description BLOOD LEFT ANTECUBITAL  Final   Special Requests   Final    BOTTLES DRAWN AEROBIC ONLY Blood Culture adequate volume   Culture   Final    NO GROWTH 1 DAY Performed at [facility], [address] [place], [place] [address]    Report Status PENDING  Incomplete  Culture, blood (routine x 2)     Status: None (Preliminary result)   Collection Time: 09/30/18 11:24 AM  Result  Value Ref Range Status   Specimen Description BLOOD LEFT HAND  Final   Special Requests   Final    BOTTLES DRAWN AEROBIC ONLY Blood Culture results may not be optimal due to an inadequate volume of blood received in culture bottles   Culture   Final    NO GROWTH 1 DAY Performed at [facility], [place] [address] [place], [place] [address]    Report Status PENDING  Incomplete  Culture, respiratory     Status: None (Preliminary result)   Collection Time: 09/30/18  2:01 PM  Result Value Ref Range Status   Specimen Description TRACHEAL ASPIRATE  Final   Special Requests Normal  Final   Gram Stain   Final    MODERATE WBC PRESENT, PREDOMINANTLY PMN MODERATE GRAM VARIABLE ROD Performed at [facility], [place] [address] [place], [place] [address]    Culture ABUNDANT DIPHTHEROIDS(CORYNEBACTERIUM SPECIES)  Final   Report Status PENDING  Incomplete      Radiology Studies: No results found.   35 minutes with more than 50% spent in reviewing records, counseling patient and coordinating care.  Alyxander Kollmann [facility] Pager [phone]  If 7PM-7AM, please contact night-coverage [email or web address] Password [email or web address] 10/02/2018, 7:00 AM

## 2018-10-02 NOTE — Care Management Important Message (Signed)
Important Message  Patient Details  Name: Renee Noble MRN: [id number] Date of Birth: Jun 23, 1955   Medicare Important Message Given:  Yes    Keithan Dileonardo 10/02/2018, 3:00 PM

## 2018-10-03 ENCOUNTER — Inpatient Hospital Stay (HOSPITAL_COMMUNITY): Payer: Medicare Other

## 2018-10-03 DIAGNOSIS — E662 Morbid (severe) obesity with alveolar hypoventilation: Secondary | ICD-10-CM

## 2018-10-03 LAB — BASIC METABOLIC PANEL
Anion gap: 10 (ref 5–15)
BUN: 54 mg/dL — ABNORMAL HIGH (ref 8–23)
CO2: 48 mmol/L — ABNORMAL HIGH (ref 22–32)
Calcium: 8.6 mg/dL — ABNORMAL LOW (ref 8.9–10.3)
Chloride: 84 mmol/L — ABNORMAL LOW (ref 98–111)
Creatinine, Ser: 0.9 mg/dL (ref 0.44–1.00)
GFR calc Af Amer: >60 mL/min (ref >60)
GFR calc non Af Amer: >60 mL/min (ref >60)
Glucose, Bld: 92 mg/dL (ref 70–99)
Potassium: 3.5 mmol/L (ref 3.5–5.1)
Sodium: 142 mmol/L (ref 135–145)

## 2018-10-03 LAB — GLUCOSE, CAPILLARY
Glucose-Capillary: 84 mg/dL (ref 70–99)
Glucose-Capillary: 81 mg/dL (ref 70–99)
Glucose-Capillary: 94 mg/dL (ref 70–99)
Glucose-Capillary: 102 mg/dL — ABNORMAL HIGH (ref 70–99)
Glucose-Capillary: 92 mg/dL (ref 70–99)
Glucose-Capillary: 78 mg/dL (ref 70–99)

## 2018-10-03 LAB — BLOOD GAS, ARTERIAL
Acid-Base Excess: 23.7 mmol/L — ABNORMAL HIGH (ref 0.0–2.0)
Acid-Base Excess: 23.3 mmol/L — ABNORMAL HIGH (ref 0.0–2.0)
Bicarbonate: 51.4 mmol/L — ABNORMAL HIGH (ref 20.0–28.0)
Bicarbonate: 50.7 mmol/L — ABNORMAL HIGH (ref 20.0–28.0)
Delivery systems: POSITIVE
Drawn by: 51185
FIO2: 40
Inspiratory PAP: 17
O2 Content: 4 L/min
O2 Saturation: 87.7 %
O2 Saturation: 96 %
Patient temperature: 98.6
Patient temperature: 98.6
pCO2 arterial: 110 mmHg (ref 32.0–48.0)
pCO2 arterial: 101 mmHg (ref 32.0–48.0)
pH, Arterial: 7.289 — ABNORMAL LOW (ref 7.350–7.450)
pH, Arterial: 7.319 — ABNORMAL LOW (ref 7.350–7.450)
pO2, Arterial: 57.9 mmHg — ABNORMAL LOW (ref 83.0–108.0)
pO2, Arterial: 80.8 mmHg — ABNORMAL LOW (ref 83.0–108.0)

## 2018-10-03 LAB — CBC
HCT: 30.6 % — ABNORMAL LOW (ref 36.0–46.0)
Hemoglobin: 8.3 g/dL — ABNORMAL LOW (ref 12.0–15.0)
MCH: 24.6 pg — ABNORMAL LOW (ref 26.0–34.0)
MCHC: 27.1 g/dL — ABNORMAL LOW (ref 30.0–36.0)
MCV: 90.5 fL (ref 80.0–100.0)
Platelets: 75 10*3/uL — ABNORMAL LOW (ref 150–400)
RBC: 3.38 MIL/uL — ABNORMAL LOW (ref 3.87–5.11)
RDW: 20.2 % — ABNORMAL HIGH (ref 11.5–15.5)
WBC: 6.6 10*3/uL (ref 4.0–10.5)
nRBC: 0 % (ref 0.0–0.2)

## 2018-10-03 LAB — MAGNESIUM: Magnesium: 1.9 mg/dL (ref 1.7–2.4)

## 2018-10-03 LAB — PHOSPHORUS: Phosphorus: 3.9 mg/dL (ref 2.5–4.6)

## 2018-10-03 MED ORDER — ACETAZOLAMIDE 250 MG PO TABS
250.0000 mg | ORAL_TABLET | Freq: Two times a day (BID) | ORAL | Status: DC
  Administered 2018-10-03 (×2): 250 mg via ORAL
  Filled 2018-10-03 (×4): qty 1

## 2018-10-03 MED ORDER — FUROSEMIDE 10 MG/ML IJ SOLN
40.0000 mg | Freq: Two times a day (BID) | INTRAMUSCULAR | Status: DC
  Administered 2018-10-03 – 2018-10-05 (×4): 40 mg via INTRAVENOUS
  Filled 2018-10-03 (×4): qty 4

## 2018-10-03 MED ORDER — INSULIN ASPART 100 UNIT/ML ~~LOC~~ SOLN: 3.0000 [IU] | Freq: Three times a day (TID) | SUBCUTANEOUS | Status: DC

## 2018-10-03 MED ORDER — METOPROLOL TARTRATE 5 MG/5ML IV SOLN
5.0000 mg | Freq: Three times a day (TID) | INTRAVENOUS | Status: DC
  Administered 2018-10-03 – 2018-10-04 (×4): 5 mg via INTRAVENOUS
  Filled 2018-10-03 (×4): qty 5

## 2018-10-03 NOTE — Progress Notes (Signed)
Upon this nurse return to unit, pt noted to have mask removed and O2 sats decreased to 74%. Pt was attempting to put on a nasal cannula and said "the doctor told me I could". Pt informed of her O2 sats and that no notification was made to this nurse by doctor or other team members. That all team members that has been in this nurse present has informed the patient that she needed to keep bipap on for breathing purpose. When patient confirmed whether she wantt less medical interventions then a plan of care can be put in place at that time.

## 2018-10-03 NOTE — Progress Notes (Signed)
Critical ABG results called to Dr Wendee Beavers.  Pt is to remain on BiPAP until ABG normalizes.  [facility] is down at this time.  Critical results will be entered when system is back up.

## 2018-10-03 NOTE — Progress Notes (Signed)
SLP Cancellation Note  Patient Details Name: Renee Noble MRN: [id number] DOB: June 11, 1955   Cancelled treatment:       Reason Eval/Treat Not Completed: Patient declined, no reason specified. SLP checked with RN and was told patient could come off BiPap and go on 4 liters O2. SLP explained purpose of visit to patient and she declined to come off BiPap, shaking head no, and when asked if she was having difficulty with her breathing, she nodded head yes. Will attempt later PM or next date.   [name] 10/03/2018, 12:15 PM   [name], MA, CCC-SLP Speech Therapy [facility] Pager: [phone]

## 2018-10-03 NOTE — Progress Notes (Signed)
NAME:  Renee Noble, MRN:  [id number], DOB:  03-07-1956, LOS: 0 ADMISSION DATE:  10/08/2018, CONSULTATION DATE:  09/21/2018 REFERRING MD:  Dr. Ralene Bathe, CHIEF COMPLAINT:  AMS  Brief History   63 yo female from SNF with altered mental status, acute on chronic hypercapnia with acidosis and hyperkalemia readmitted May 6. Patient has been seen by oncology and gastroenterology. Patient was treated with BiPAP support diuresis initially.  Transfer to hospitalist on May 9. Has been found to have a newly identified 4.7 cm mass of the head of the pancreas and a right kidney mass.  Per GI notes felt to be a poor candidate for endoscopy.  Plans for MRI MRCP.  Consulted by interventional radiology for right renal mass biopsy. 5/15 am with altered mental status , lethargic , PCCM consulted , patient required intubation.  Extubated 5/17 and transferred to PCU and [facility] service as of 5/18.   On 5/19, patient with increasing work of breathing and found on ABG to be hypercarbic with CO2 of 110.  PCCM asked to see again.   Past Medical History  Severe COPD, chronic respiratory failure on 3 -4 liters oxygen, diastolic CHF, DM II, Hypothyroidism, OSA, Depression, Chronic pain, Hepatitis C , OHS  Cirrhosis and splenomegaly on CT abdomen 2016, chronic thrombocytopenia, previous nephrectomy, insulin-dependent diabetes  patient has hepatitis C treated with SOVALDI and ribavirin 2015.  Hospitalization April 13 through Sep 16, 2018 for acute on chronic respiratory failure requiring intubation x2.  Felt to be noncompliant of nocturnal CPAP.  Significant Hospital Events   4/13- 5/2 hospitalized 5/07 Admitted 5/08 Transfered to progressive care for BiPAP 5/15 Transferred to ICU for AHRF requiring intubation 5/16 Febrile 5/17 Tolerated SBT and plan for extubation 5/18 to [facility] and PCU 5/19 re-eval for hypercarbia.   Consults:  Renal 5/07  GI 5/13 Oncology 5/11 IR 5/14   Procedures:  5/15 ETT >> 5/17  Significant  Diagnostic Tests:  5/13 CT abd/pelvis -10.7 cm solid mass right kidney, 4.4 cm pancreatic head mass, moderate bilateral pleural effusions, hepatic cirrhosis, portal venous hypertension, mild ascites, diffuse mesenteric and body wall edema Moderate splenomegaly, no pathologically enlarged lymph nodes  Micro Data:  5/6 BC x 2 >>NEG x  5/6 BC -Staph species (coag neg )  5/6 COVID >> neg 5/16 Moderate gram variable rod, final pending  Antimicrobials:  Ceftriaxone 5/16> Azithro 5/16>  Interim history/subjective:  Patient found awake on BiPAP, talking, asking to eat.  Objective   BP (!) 162/48   Pulse (!) 109   Temp (!) 97.3 F (36.3 C) (Axillary)   Resp (!) 21   Ht 5\' 4"  (1.626 m)   Wt 134.2 kg   SpO2 100%   BMI 50.78 kg/m   I/O last 3 completed shifts: In: 572.4 [P.O.:400; I.V.:72.4; IV Piggyback:100] Out: 6578 [Urine:3450; Stool:1]  Physical Exam: General:  Obese, chronically ill appearing female on BiPAP in NAD HEENT: full face BIPAP in place, dentures intermittently loose Neuro: Awake, f/c, MAE CV: rr, no murmur PULM: even/non-labored, lungs bilaterally clear anteriorly, diminished in bases GI: morbidly obese, +bs, +stool Extremities: warm/dry, generalized edema Skin: no rashes   Resolved Hospital Problem list    Assessment & Plan:  62 year old female with morbid obesity, chronic respiratory failure 2/2 to COPD/OSA/OHS, acute heart failure, AF and DM2.    Acute on chronic hypoxic/hypercapnic respiratory failure - multifactorial in setting of known COPD/OSA/OHS and acute heart failure COVID-19 negative Plan Ok to remain in PCU BiPAP prn and  mandatory q HS Diuresis per primary team Continue dulera and duonebs  PMT for [place] clarification     She states she wore BiPAP last night but the chart does not reflect and verified with RT.   Given her morbid obesity- BMI 50, known COPD 4 with FEV1 30%/ OSA/ OHS and known baseline CO2 ~80, she will need BiPAP prn and  mandatory here and at discharge.    Currently, her mental status has returned to baseline.  She verbalizes understanding that is she does not wear her BiPAP she will require intubation again.  She stated she did not want to be re-intubated.  Clarifying this, she understands that if she does not want intubation, she understands and verbalizes that she will die.  She states she wants to talk to her son.    Reading Dr. Juliann Pares note this morning, patient states she wanted to be a full code.  He has already asked for PMT to see patient again for clarification of GOCs.  She will remain a full code for now.    Best practice:  Diet: per primary  DVT prophylaxis: SCDs GI prophylaxis: Pepcid  Mobility: BR  Code Status: full Disposition: PCU  Labs    CMP Latest Ref Rng & Units 10/03/2018 10/02/2018 10/01/2018  Glucose 70 - 99 mg/dL 92 107(H) 153(H)  BUN 8 - 23 mg/dL 54(H) 64(H) 76(H)  Creatinine 0.44 - 1.00 mg/dL 0.90 1.04(H) 1.25(H)  Sodium 135 - 145 mmol/L 142 140 140  Potassium 3.5 - 5.1 mmol/L 3.5 3.6 3.4(L)  Chloride 98 - 111 mmol/L 84(L) 84(L) 85(L)  CO2 22 - 32 mmol/L 48(H) 47(H) 47(H)  Calcium 8.9 - 10.3 mg/dL 8.6(L) 8.4(L) 8.5(L)  Total Protein 6.5 - 8.1 g/dL - - -  Total Bilirubin 0.3 - 1.2 mg/dL - - -  Alkaline Phos 38 - 126 U/L - - -  AST 15 - 41 U/L - - -  ALT 0 - 44 U/L - - -   CBC Latest Ref Rng & Units 10/03/2018 10/02/2018 10/01/2018  WBC 4.0 - 10.5 K/uL 6.6 7.8 7.8  Hemoglobin 12.0 - 15.0 g/dL 8.3(L) 8.4(L) 8.2(L)  Hematocrit 36.0 - 46.0 % 30.6(L) 30.4(L) 28.7(L)  Platelets 150 - 400 K/uL 75(L) 87(L) 94(L)   CBG (last 3)  Recent Labs    10/03/18 0502 10/03/18 0728 10/03/18 1113  GLUCAP 81 94 102*   ABG    Component Value Date/Time   PHART 7.289 (L) 10/03/2018 0935   PCO2ART 110 (HH) 10/03/2018 0935   PO2ART 57.9 (L) 10/03/2018 0935   HCO3 51.4 (H) 10/03/2018 0935   TCO2 >50 (H) 09/29/2018 1853   O2SAT 87.7 10/03/2018 [place], MSN, AGACNP-BC  [facility] Pgr: [phone] or if no answer [phone] 10/03/2018, 3:16 PM

## 2018-10-03 NOTE — Progress Notes (Signed)
PROGRESS NOTE  Renee Noble QQI:[id number] DOB: Apr 04, 1956 DOA: 09/24/2018 PCP: [name], PA-C   LOS: 13 days   Patient is from: [facility]  Brief Narrative / Interim history: 63 year old female with history of chronic hypoxic and hypercarbic respiratory failure on 4 L oxygen, COPD, OHS, OSA on CPAP, chronic diastolic CHF, paroxysmal atrial fibrillation, DM-2, thrombocytopenia, anemia of chronic disease admitted on 5/6 with altered mental status and hyperkalemia found to be on acute on chronic respiratory failure.  COVID-19 is negative.  In ED, afebrile, normotensive, tachypneic with altered mental status.  ABG with compensated respiratory acidosis and hypoxemia.  Potassium 7.2.  Serum creatinine 1.6.  Hemoglobin 8.1.  No leukocytosis.  Platelet 92.  BNP 1090.  EKG with A. fib and rate of 87.  She was treated with Lasix and placed on BiPAP.  Hyperkalemia was treated with temporizing measures.  She was admitted to ICU.   In ICU, she was continued on BiPAP, breathing treatments and diuretics.  She was transferred to [facility] on 5/9.  She had a CT abdomen that showed pancreatic and renal mass.  She had renal biopsy now shows renal cell carcinoma.  Oncology was consulted but did not feel she could be a good candidate for aggressive intervention about the malignancies.  Gastroenterology was also consulted about the pancreatic mass for potential biopsy.  However, patient had worsening encephalopathy on 5/15 and moved back to ICU and got intubated.  Patient was extubated on 5/17 and [facility] service assumed care on 5/18.  Subjective: No major events overnight.  Working hard to breathe this morning.  She says she wore BiPAP last night.  Denies chest pain.  Briefly discussed about goal of care including CODE STATUS in light of her current illness and comorbidities.  Patient would like to remain full code.  She is open to meet with palliative care to discuss about this further.  Assessment &  Plan: Acute on chronic hypoxic/hypercapnic respiratory failur Severe COPD exacerbation-improved OSA/OHS-on nightly CPAP Recurrent intubation with most recent one from 5/15 to 5/17 -Working hard to breathe this morning.  Protecting her airway.  ABG with acute on chronic respiratory acidosis with hypercarbia to 110. -Currently alert and oriented x4.  Protecting airway. Started on BiPAP.  CCM reconsulted -We will get CXR now and repeat ABG in about an hour after BiPAP.  If no improvement, she may need to be intubated again. -We will keep n.p.o. while on BiPAP. -Started on ceftriaxone and azithromycin on 5/16 for CAP coverage. -Continue PRN DuoNeb, scheduled Dulera -Has already completed steroid course. -Avoid sedating medications -Moved to progressive care  HFpEF Exacerbation:  -TTE 5/7 EF 50-55% , decreased RV systolic fxn , RV severe dilation  -TTE 5/15 EF 60-65%, reduced RV systolic fxn, mild RV enlargement, moderate biatrial  -BNP ~1090.4.  CXR and CT chest concerning for pulmonary congestion. -Excellent urine output with with net negative of -1.4 over the last 24-hour/-14.5 since admission -Continue home Lasix.  May have to change medications to IV if she remains on BiPAP. -BiPAP as above. -Daily weight, intake output and renal function. -Monitor electrolytes closely and replenish as appropriate.  Permanent atrial fibrillation: Rate controlled.  Not on anticoagulation likely due to thrombocytopenia. -Continue home diltiazem -May have to change to IV if she remains on BiPAP.  Renal cell carcinoma/pancreatic mass/RUL pulmonary nodule:  -Evaluated by oncology-likely not a candidate for aggressive intervention. -Oncology recommended MRI versus EUS biopsy for the pancreatic mass given her comorbidities but she did  not tolerate MRI -Renal inferior pole mass concerning for malignancy with suggestion of extension of tumor thrombus into R renal vein and possibly IVC -Palliative care  consulted  Bicytopenia with anemia and thrombocytopenia: Stable. -s/p granix; monitor CBC  Well-controlled IDDM-2 with renal complication.  A1c 5.3%.  CBG within normal range. -CBG monitoring -Continue Lantus and SSI insulin.  Hypothyroidism -Continue Synthroid.  Hx of depression/chronic pain -scheduled APAP, voltaren, lidocaine patch, nortriptyline  -Holding home opiate.  Acute toxic and metabolic encephalopathy: likely a combination of sedating medications and respiratory failure -Resolved.  Deconditioning/goal of care: Patient with acute on chronic respiratory failure, recurrent intubation now with renal cancer and possible pancreatic cancer.  Per oncology, not a good candidate for aggressive intervention given her underlying cardiopulmonary comorbidities.  We have discussed about goal of care and CODE STATUS in light of her acute illness, chronic comorbidities and current diagnosis of cancer.  Patient would like to remain full code.  She is open to meet with palliative care and discuss this further. -Palliative care consulted.  Wound type: CVI, moisture associated skin damage, healed full thickness pressure injury to left ischial tuberosity Pressure Injury POA: Yes Measurement: Left IT:  3cm x 2.5cm newly reepithelialized Left lateral LE: Healed VLU. Staff noted weeping at area near knee today, no longer weeping. Right medial LE: 3cm x 2.5cm dry, crusted ulcer. Unable to assess depth due to the presence of dried serum (scab) vs eschar Intertriginous skin loss at the gluteal cleft: partial thickness two area measuring 3cm x 0.4cm x 0.2cm with pink, moist wound bed. Scant serous to serosanguinous exudate. Wound bed:As described above -Appreciate wound care input.  Morbid obesity BMI 52.  Multiple comorbidities -Appreciate nutrition input.   Scheduled Meds: . acetaminophen  650 mg Oral Q6H  . acetaZOLAMIDE  250 mg Oral BID  . ammonium lactate   Topical BID  . azithromycin   250 mg Oral Daily  . chlorhexidine gluconate (MEDLINE KIT)  15 mL Mouth Rinse BID  . Chlorhexidine Gluconate Cloth  6 each Topical Daily  . diclofenac sodium  2 g Topical QID  . diltiazem  60 mg Oral Q8H  . famotidine  20 mg Oral BID  . furosemide  40 mg Oral BID  . insulin aspart  3-9 Units Subcutaneous Q4H  . insulin detemir  10 Units Subcutaneous BID  . levothyroxine  88 mcg Oral Q0600  . lidocaine  1 patch Transdermal Q24H  . mometasone-formoterol  2 puff Inhalation BID  . multivitamin with minerals  1 tablet Oral Daily  . nortriptyline  25 mg Oral QHS  . polyethylene glycol  17 g Oral BID  . pravastatin  20 mg Oral Daily  . Ensure Max Protein  11 oz Oral BID  . senna-docusate  2 tablet Oral BID  . sodium chloride flush  10-40 mL Intracatheter Q12H   Continuous Infusions: . sodium chloride 10 mL/hr at 10/01/18 1500  . cefTRIAXone (ROCEPHIN)  IV 2 g (10/02/18 1213)   PRN Meds:.sodium chloride, albuterol, bisacodyl, dextromethorphan-guaiFENesin, ipratropium-albuterol, lactulose, metoprolol tartrate, ondansetron (ZOFRAN) IV, Resource ThickenUp Clear, sodium chloride flush, sodium chloride flush   DVT prophylaxis: SCD due to thrombocytopenia Code Status: Full code Family Communication: None at bedside.  Disposition Plan: Remains inpatient due to acute on chronic respiratory failure.  Escalating care to stepdown level  Consultants:   PCCM  Nephrology on 5/7  Oncology on 5/11  Gastroenterology on 5/13  IR on 5/14  Palliative care on 5/18  Procedures:  EGD 5/15-5/17  CT-guided renal biopsy on 5/14  Microbiology: 5/6 BC x 2 >>NEG x  5/6 BC -Staph species (coag neg )  5/6 COVID >> neg 5/16 Moderate gram variable rod, final pending  Antimicrobials: Anti-infectives (From admission, onward)   Start     Dose/Rate Route Frequency Ordered Stop   10/02/18 1245  azithromycin (ZITHROMAX) tablet 250 mg     250 mg Oral Daily 10/02/18 1244 10/05/18 0959   09/30/18 1100   cefTRIAXone (ROCEPHIN) 2 g in sodium chloride 0.9 % 100 mL IVPB     2 g 200 mL/hr over 30 Minutes Intravenous Every 24 hours 09/30/18 1020 10/05/18 1059   09/30/18 1100  azithromycin (ZITHROMAX) 250 mg in dextrose 5 % 125 mL IVPB  Status:  Discontinued     250 mg 125 mL/hr over 60 Minutes Intravenous Every 24 hours 09/30/18 1020 10/02/18 1243      Objective: Vitals:   10/02/18 2026 10/02/18 2244 10/02/18 2351 10/03/18 0400  BP:   106/75 128/61  Pulse: 100 91 82 98  Resp: '19 18 17 15  '$ Temp:   98.2 F (36.8 C) 98.1 F (36.7 C)  TempSrc:   Oral Oral  SpO2: 100% 100% 99% 100%  Weight:    134.2 kg  Height:        Intake/Output Summary (Last 24 hours) at 10/03/2018 0703 Last data filed at 10/03/2018 0424 Gross per 24 hour  Intake 572.38 ml  Output 2001 ml  Net -1428.62 ml   Filed Weights   10/01/18 0359 10/02/18 0300 10/03/18 0400  Weight: 133.4 kg (!) 137.3 kg 134.2 kg    Examination:  GENERAL: Appears to be working hard to breathe. HEENT: MMM.  Vision and hearing grossly intact.  NECK: Supple.  Difficult to assess JVD. LUNGS: Some increased work of breathing.  Poor aeration bilaterally but difficult exam due to body habitus. HEART:  RRR. Heart sounds normal.  ABD: Bowel sounds present. Soft. Non tender.  MSK/EXT:  Moves all extremities.  Venous stasis bilaterally. SKIN: Skin change over lower extremities due to venous stasis. NEURO: Awake, alert and oriented x4.  No gross deficit.   Data Reviewed: I have independently reviewed following labs and imaging studies  CBC: Recent Labs  Lab 09/27/18 0429 09/27/18 0724 09/28/18 0420 09/29/18 0423  09/29/18 1853 09/30/18 0357 10/01/18 0345 10/02/18 0645 10/03/18 0449  WBC 5.8 6.4 8.1 9.5  --   --  6.8 7.8 7.8 6.6  NEUTROABS 4.9 5.5 7.0 8.8*  --   --  5.3  --   --   --   HGB 8.4* 8.5* 8.7* 8.8*   < > 9.5* 7.8* 8.2* 8.4* 8.3*  HCT 29.4* 30.1* 31.5* 32.6*   < > 28.0* 27.4* 28.7* 30.4* 30.6*  MCV 86.7 86.7 90.5 92.1   --   --  87.8 87.0 89.9 90.5  PLT 83* 83* 101* 121*  --   --  101* 94* 87* 75*   < > = values in this interval not displayed.   Basic Metabolic Panel: Recent Labs  Lab 09/29/18 1314  09/29/18 1629 09/29/18 1853 09/30/18 0357 10/01/18 0345 10/02/18 0645 10/03/18 0449  NA 138   < >  --  134* 138 140 140 142  K 5.3*   < >  --  4.9 4.7 3.4* 3.6 3.5  CL 83*  --   --   --  84* 85* 84* 84*  CO2 43*  --   --   --  46* 47* 47* 48*  GLUCOSE 92  --   --   --  130* 153* 107* 92  BUN 67*  --   --   --  71* 76* 64* 54*  CREATININE 1.39*  --   --   --  1.42* 1.25* 1.04* 0.90  CALCIUM 9.2  --   --   --  8.7* 8.5* 8.4* 8.6*  MG  --   --  2.2  --  1.9 1.8 1.8 1.9  PHOS  --   --  3.0  --  2.9 2.7 3.6 3.9   < > = values in this interval not displayed.   GFR: Estimated Creatinine Clearance: 85.1 mL/min (by C-G formula based on SCr of 0.9 mg/dL). Liver Function Tests: Recent Labs  Lab 09/27/18 0429 09/29/18 0423  AST 17  --   ALT 24  --   ALKPHOS 107  --   BILITOT 1.0  --   PROT 5.4*  --   ALBUMIN 2.9* 3.1*   No results for input(s): LIPASE, AMYLASE in the last 168 hours. No results for input(s): AMMONIA in the last 168 hours. Coagulation Profile: Recent Labs  Lab 09/27/18 1634  INR 1.1   Cardiac Enzymes: No results for input(s): CKTOTAL, CKMB, CKMBINDEX, TROPONINI in the last 168 hours. BNP (last 3 results) No results for input(s): PROBNP in the last 8760 hours. HbA1C: Recent Labs    10/02/18 1404  HGBA1C 5.3   CBG: Recent Labs  Lab 10/02/18 1603 10/02/18 1944 10/02/18 2353 10/03/18 0406 10/03/18 0502  GLUCAP 111* 143* 157* 84 81   Lipid Profile: No results for input(s): CHOL, HDL, LDLCALC, TRIG, CHOLHDL, LDLDIRECT in the last 72 hours. Thyroid Function Tests: No results for input(s): TSH, T4TOTAL, FREET4, T3FREE, THYROIDAB in the last 72 hours. Anemia Panel: No results for input(s): VITAMINB12, FOLATE, FERRITIN, TIBC, IRON, RETICCTPCT in the last 72 hours. Urine  analysis:    Component Value Date/Time   COLORURINE YELLOW 08/29/2018 0950   APPEARANCEUR CLEAR 08/29/2018 0950   LABSPEC 1.014 08/29/2018 0950   PHURINE 5.0 08/29/2018 0950   GLUCOSEU NEGATIVE 08/29/2018 0950   GLUCOSEU NEGATIVE 08/17/2016 1340   HGBUR SMALL (A) 08/29/2018 0950   BILIRUBINUR NEGATIVE 08/29/2018 0950   BILIRUBINUR neg 01/15/2015 1353   KETONESUR NEGATIVE 08/29/2018 0950   PROTEINUR 30 (A) 08/29/2018 0950   UROBILINOGEN 1.0 08/17/2016 1340   NITRITE NEGATIVE 08/29/2018 0950   LEUKOCYTESUR NEGATIVE 08/29/2018 0950   Sepsis Labs: Invalid input(s): PROCALCITONIN, LACTICIDVEN  Recent Results (from the past 240 hour(s))  Culture, blood (routine x 2)     Status: None (Preliminary result)   Collection Time: 09/30/18 11:24 AM  Result Value Ref Range Status   Specimen Description BLOOD LEFT ANTECUBITAL  Final   Special Requests   Final    BOTTLES DRAWN AEROBIC ONLY Blood Culture adequate volume   Culture   Final    NO GROWTH 2 DAYS Performed at [facility], [address], [place], [place] [address]    Report Status PENDING  Incomplete  Culture, blood (routine x 2)     Status: None (Preliminary result)   Collection Time: 09/30/18 11:24 AM  Result Value Ref Range Status   Specimen Description BLOOD LEFT HAND  Final   Special Requests   Final    BOTTLES DRAWN AEROBIC ONLY Blood Culture results may not be optimal due to an inadequate volume of blood received in culture bottles   Culture   Final  NO GROWTH 2 DAYS Performed at [facility], [place] [address] [place], [place] [address]    Report Status PENDING  Incomplete  Culture, respiratory     Status: None   Collection Time: 09/30/18  2:01 PM  Result Value Ref Range Status   Specimen Description TRACHEAL ASPIRATE  Final   Special Requests Normal  Final   Gram Stain   Final    MODERATE WBC PRESENT, PREDOMINANTLY PMN MODERATE GRAM VARIABLE ROD    Culture   Final    ABUNDANT  DIPHTHEROIDS(CORYNEBACTERIUM SPECIES) Standardized susceptibility testing for this organism is not available. Performed at [facility], [place] [address], [place], [place] [address]    Report Status 10/02/2018 FINAL  Final      Radiology Studies: No results found.   35 minutes with more than 50% spent in reviewing records, counseling patient and coordinating care.   T. [facility] Pager [phone]  If 7PM-7AM, please contact night-coverage [email or web address] Password [email or web address] 10/03/2018, 7:03 AM

## 2018-10-03 NOTE — Progress Notes (Signed)
Pt request pain med. for midsternum pain. MD paged.

## 2018-10-03 NOTE — Consult Note (Signed)
Consultation Note Date: 10/03/2018   Patient Name: Renee Noble  DOB: May 10, 1956  MRN: [id number]  Age / Sex: 63 y.o., female   PCP: [name], PA-C Referring Physician: Mercy Riding, MD   REASON FOR CONSULTATION:Establishing goals of care  Palliative Care consult requested for this 63 y.o. female with multiple medical problems including COPD (3L at home), diastolic CHF, chronic hypoxic/hypercarbic respiratory failure, OSA, paroxysmal atrial fibrillation (not on anticoagulants due to chronic thrombocytopenia), type 2 diabetes, anemia, obesity, apical compression fracture, chronic kidney disease stage III, fibromyalgia, hepatitis C, hypertension, neuropathy, osteoarthritis, venous stasis, and renal cancer.  Patient was recently hospitalized for acute on chronic respiratory failure requiring intubation x2.  Patient admitted with similar presentation now including hyperkalemia.  Continues to be dependent on BiPAP for respiratory support.  Clinical Assessment and Goals of Care: I have reviewed medical records including lab results, imaging, Epic notes, and MAR, received report from the bedside RN, and assessed the patient. I met at the bedside with patient and spoke with her cousin/poa (who is more like a sister) via phone to discuss diagnosis prognosis, Tens[place], EOL wishes, disposition and options.  Patient is awake, alert, and oriented x3.  She remains dependent on BiPAP.  Able to communicate as much as possible through mask. Patient continues to ask about taking mask off and actually pulled off several times stating she wants to eat. Respiratory called to the bedside to reposition mask. Patient educated the importance of continuing to wear mass due to her rapid decline, hypoxia, worsening CO2.  She verbalized understanding.  I introduced Palliative Medicine as specialized medical care for people living with serious illness. It focuses on providing relief from the symptoms and stress of a  serious illness. The goal is to improve quality of life for both the patient and the family.  Patient and family are familiar with our team on previous hospitalizations.  We discussed a brief life review of the patient, along with her functional and nutritional status.  Family reports patient is originally from [place] and recently moved to [place] approximately 7 years ago after living in her car in [place].  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  Family reports she also worked in a Pharmacologist for many years which they contribute to her interstitial lung disease.  Patient was residing at [facility] for rehabilitation.  He required assistance with ADLs.  Family reports patient has not always been compliant with medical treatments and recommendations.  We discussed Her current illness and what it means in the larger context of Her on-going co-morbidities. With specific discussions regarding CHF, worsening respiratory failure, BiPAP dependency, renal cell carcinoma with pancreatic mass and right upper lobe pulmonary nodule.  Natural disease trajectory and expectations at EOL were discussed.  Patient and family verbalized understanding of her current medical conditions and comorbidities.  Patient expressed "I am thinking about hospice please tell me more.  I just want to be comfortable and not continue to suffer any longer but I need to talk to my son first"). Support given.   I attempted to elicit values and goals of care important to the patient.    The difference between aggressive medical intervention and comfort care was considered in light of the patient's goals of care. I educated patient/family on what comfort care measures would look like. Patient pulled off BiPAP and stated "I just want  to be able to eat what I want when I can, kept comfortable, not want to wear this mask, and die in peace. I don't want to be  hooked to machines or anything!" Support and comfort provided. She is tearful in her statement. I was able to encourage her to reapply mask. I educated patient, she was required to continue to wear BiPAP due to severity of her condition, until she was able to make a decision regarding her level of care. Without the mask she immediately desaturates and although she is wanting to eat, this was contraindicated at this time due to risk of aspiration with BiPAP. She verbalized understanding and expressed she would continue to wear mask until she spoke with her son and he verbalized he was ok with her decisions.   I discussed with patient and her family member the need for a continuous morphine or Dilaudid drip to ensure symptom management of air hunger as well as medication usage for other symptoms such as anxiety, nausea, and excessive secretions. Both verbalized understanding and agreement once decision was made.   I explained in detail both patient and [name] patient would most likely pass away if she chooses not to continue to wear BiPAP as her body could not meet her oxygenation demands.  Patient mouthed she understood and [name] expressed she understood. [name] is tearful and expressed she did not want Ms. Faeth to continue to suffer and she knows that she is not strong enough to take any type of aggressive treatments even if it was an option. She states she is tired of seeing her suffer and she knows her body is tired. Patient expressed she was tired. She again stated she needed to talk with her son.   [name] has reached out to the son, she was able to speak with him last night she reported and she told him at that time it was not looking good. She reported that son verbalized understanding but was unable to come to visit as he lives in [place]. [name] attempted to reach out to him but he does not carry his phone with him at work and she left a message with his wife to have him to call the  nurses station to get someone to allow them to communicate. I also attempted to call son, and no answer and no voicemail. Patient is aware and present during call attempts.   Ms. Tuccillo verbalized that Venancio Poisson is her designated Education officer, community.  I discussed in detail with patient and her family her full CODE STATUS with respect to her current illnesses, comorbidities, and verbalized wishes for comfort.  Patient expressed she does not want to be reintubated, does not want CPR, ACLS medications, or defibrillation.  She has requested to continue with BiPAP usage until she has had the opportunity to speak with her son and then at that point she is requesting ability to have BiPAP removed for comfort.  Bedside RN was present during this discussion.  Again patient educated on her decision regarding a limited code.  She verbalized her wishes again for no heroic measures with the exception of BiPAP usage until she is able to make a connection with her son.   Discussed with patient hospice.  She and her sister verbalized understanding.  I explained to patient although we are discussing hospice, if she chooses to be removed from BiPAP and shift all care to a more comfort approach she would most likely have an in hospital death given her heavy  symptom burden.   Questions and concerns were addressed. The family was encouraged to call with questions or concerns.  PMT will continue to support holistically.   SOCIAL HISTORY:     reports that she quit smoking about 2 years ago. Her smoking use included cigarettes. She has a 112.50 pack-year smoking history. She has never used smokeless tobacco. She reports that she does not drink alcohol or use drugs.  CODE STATUS: Limited code BIPAP USE ONLY!   ADVANCE DIRECTIVES: Primary Decision Maker: PATIENT & [name] MILLER (POA)     SYMPTOM MANAGEMENT: per attending   Palliative Prophylaxis:   Aspiration, Delirium Protocol, Frequent Pain Assessment and Turn  Reposition  PSYCHO-SOCIAL/SPIRITUAL:  Support System: Family   Desire for further Chaplaincy support: YES   Additional Recommendations (Limitations, Scope, Preferences):  Continue to treat without escalation.    PAST MEDICAL HISTORY: Past Medical History:  Diagnosis Date   Abdominal mass of other site    Cervical compression fracture (HCC)    CHF (congestive heart failure) (HCC)    Chronic kidney disease, stage 3 (HCC)    Borderline Stage 2-3   Chronic lower limb pain    COPD (chronic obstructive pulmonary disease) (HCC)    CTS (carpal tunnel syndrome)    Depression    Diabetes (HCC)    Type II   Fibromyalgia    Hepatitis C    cured last year (2018)   Hypercholesteremia    Hypertension    Hypothyroidism    Morbid obesity (HCC)    Neuropathy    Diabetes   OSA (obstructive sleep apnea)    Osteoarthritis    Renal cancer (HCC)    Left Kidney Removed   RLS (restless legs syndrome)    Syncope    Venous stasis     PAST SURGICAL HISTORY:  Past Surgical History:  Procedure Laterality Date   ABDOMINAL HERNIA REPAIR     x2   ABDOMINAL HYSTERECTOMY     BLADDER SUSPENSION     CHOLECYSTECTOMY     CYST EXCISION     Head   INCISIONAL HERNIA REPAIR     KNEE ARTHROSCOPY     Bilateral   NEPHRECTOMY     Left   TONSILLECTOMY     TOOTH EXTRACTION     UMBILICAL HERNIA REPAIR     VAGINA SURGERY     WISDOM TOOTH EXTRACTION      ALLERGIES:  is allergic to gabapentin; lyrica [pregabalin]; ketorolac tromethamine; and lisinopril.   MEDICATIONS:  Current Facility-Administered Medications  Medication Dose Route Frequency Provider Last Rate Last Dose   0.9 %  sodium chloride infusion   Intravenous PRN Wendee Beavers T, MD 10 mL/hr at 10/01/18 1500     acetaminophen (TYLENOL) tablet 650 mg  650 mg Oral Q6H Gonfa, Taye T, MD   650 mg at 10/01/18 2226   acetaZOLAMIDE (DIAMOX) tablet 250 mg  250 mg Oral BID Wendee Beavers T, MD   250 mg at  10/03/18 1114   albuterol (PROVENTIL) (2.5 MG/3ML) 0.083% nebulizer solution 3 mL  3 mL Inhalation Q6H PRN Wendee Beavers T, MD   3 mL at 09/24/18 1824   ammonium lactate (LAC-HYDRIN) 12 % lotion   Topical BID Wendee Beavers T, MD       azithromycin (ZITHROMAX) tablet 250 mg  250 mg Oral Daily Wendee Beavers T, MD   250 mg at 10/03/18 0914   bisacodyl (DULCOLAX) suppository 10 mg  10 mg Rectal Daily PRN Mercy Riding,  MD   10 mg at 09/28/18 1240   cefTRIAXone (ROCEPHIN) 2 g in sodium chloride 0.9 % 100 mL IVPB  2 g Intravenous Q24H Gonfa, Taye T, MD 200 mL/hr at 10/03/18 1055 2 g at 10/03/18 1055   chlorhexidine gluconate (MEDLINE KIT) (PERIDEX) 0.12 % solution 15 mL  15 mL Mouth Rinse BID Wendee Beavers T, MD   15 mL at 10/02/18 2306   Chlorhexidine Gluconate Cloth 2 % PADS 6 each  6 each Topical Daily Wendee Beavers T, MD   6 each at 10/02/18 1214   dextromethorphan-guaiFENesin ([place] DM) 30-600 MG per 12 hr tablet 1 tablet  1 tablet Oral BID PRN Wendee Beavers T, MD   1 tablet at 09/23/18 0848   diclofenac sodium (VOLTAREN) 1 % transdermal gel 2 g  2 g Topical QID Wendee Beavers T, MD   2 g at 10/01/18 2224   diltiazem (CARDIZEM) tablet 60 mg  60 mg Oral Q8H Gonfa, Taye T, MD   60 mg at 10/03/18 1313   famotidine (PEPCID) 40 MG/5ML suspension 20 mg  20 mg Oral BID Wendee Beavers T, MD   20 mg at 10/03/18 0916   furosemide (LASIX) tablet 40 mg  40 mg Oral BID Wendee Beavers T, MD   40 mg at 10/03/18 0913   insulin aspart (novoLOG) injection 3-9 Units  3-9 Units Subcutaneous TID WC Gonfa, Taye T, MD       insulin detemir (LEVEMIR) injection 10 Units  10 Units Subcutaneous BID Wendee Beavers T, MD   10 Units at 10/03/18 0917   ipratropium-albuterol (DUONEB) 0.5-2.5 (3) MG/3ML nebulizer solution 3 mL  3 mL Nebulization Q4H PRN Gonfa, Taye T, MD   3 mL at 10/01/18 1017   lactulose (CHRONULAC) 10 GM/15ML solution 20 g  20 g Oral Daily PRN Wendee Beavers T, MD   20 g at 10/01/18 0858   levothyroxine (SYNTHROID)  tablet 88 mcg  88 mcg Oral Q0600 Wendee Beavers T, MD   88 mcg at 10/03/18 0508   lidocaine (LIDODERM) 5 % 1 patch  1 patch Transdermal Q24H Wendee Beavers T, MD   1 patch at 09/30/18 0954   metoprolol tartrate (LOPRESSOR) injection 2.5 mg  2.5 mg Intravenous Q3H PRN Gonfa, Taye T, MD       mometasone-formoterol (DULERA) 200-5 MCG/ACT inhaler 2 puff  2 puff Inhalation BID Mercy Riding, MD   2 puff at 10/03/18 0810   multivitamin with minerals tablet 1 tablet  1 tablet Oral Daily Wendee Beavers T, MD   1 tablet at 10/03/18 0915   nortriptyline (PAMELOR) capsule 25 mg  25 mg Oral QHS Gonfa, Taye T, MD   25 mg at 10/02/18 2025   ondansetron (ZOFRAN) injection 4 mg  4 mg Intravenous Q6H PRN Wendee Beavers T, MD   4 mg at 10/02/18 1209   polyethylene glycol (MIRALAX / GLYCOLAX) packet 17 g  17 g Oral BID Wendee Beavers T, MD   17 g at 10/01/18 0857   pravastatin (PRAVACHOL) tablet 20 mg  20 mg Oral Daily Wendee Beavers T, MD   20 mg at 10/03/18 0915   protein supplement (ENSURE MAX) liquid  11 oz Oral BID Mercy Riding, MD   11 oz at 10/03/18 1054   Resource ThickenUp Clear   Oral PRN Mercy Riding, MD       senna-docusate (Senokot-S) tablet 2 tablet  2 tablet Oral BID Mercy Riding, MD   2 tablet at  10/02/18 0806   sodium chloride flush (NS) 0.9 % injection 10-40 mL  10-40 mL Intracatheter PRN Gonfa, Taye T, MD       sodium chloride flush (NS) 0.9 % injection 10-40 mL  10-40 mL Intracatheter Q12H Cyndia Skeeters, Taye T, MD   10 mL at 10/03/18 0918   sodium chloride flush (NS) 0.9 % injection 10-40 mL  10-40 mL Intracatheter PRN Mercy Riding, MD        VITAL SIGNS: BP (!) 162/48    Pulse (!) 109    Temp (!) 97.3 F (36.3 C) (Axillary)    Resp (!) 21    Ht _0  (1.626 m)    Wt 134.2 kg    SpO2 100%    BMI 50.78 kg/m  Filed Weights   10/01/18 0359 10/02/18 0300 10/03/18 0400  Weight: 133.4 kg (!) 137.3 kg 134.2 kg    Estimated body mass index is 50.78 kg/m as calculated from the following:   Height as of  this encounter: _1  (1.626 m).   Weight as of this encounter: 134.2 kg.  LABS: CBC:    Component Value Date/Time   WBC 6.6 10/03/2018 0449   HGB 8.3 (L) 10/03/2018 0449   HCT 30.6 (L) 10/03/2018 0449   PLT 75 (L) 10/03/2018 0449   Comprehensive Metabolic Panel:    Component Value Date/Time   NA 142 10/03/2018 0449   K 3.5 10/03/2018 0449   CO2 48 (H) 10/03/2018 0449   BUN 54 (H) 10/03/2018 0449   CREATININE 0.90 10/03/2018 0449   ALBUMIN 3.1 (L) 09/29/2018 0423     Review of Systems  Constitutional: Positive for fatigue.  Respiratory: Positive for shortness of breath.   Musculoskeletal: Positive for back pain and neck pain.  Neurological: Positive for weakness.  All other systems reviewed and are negative.  Physical Exam Vitals signs and nursing note reviewed.  Constitutional:      General: She is awake.     Appearance: She is morbidly obese. She is ill-appearing.     Comments: Chronically ill, BiPAP dependent   Cardiovascular:     Rate and Rhythm: Regular rhythm. Tachycardia present.     Pulses: Decreased pulses.     Heart sounds: Normal heart sounds.     Comments: Bilateral lower extremity weeping/edema  Pulmonary:     Effort: Tachypnea present.     Breath sounds: Decreased breath sounds present.  Abdominal:     General: Bowel sounds are normal.     Palpations: Abdomen is soft.  Musculoskeletal:     Right lower leg: Edema present.     Left lower leg: Edema present.  Skin:    General: Skin is warm and dry.     Findings: Bruising present.     Comments: Lower extremity discoloration   Neurological:     Mental Status: She is alert and oriented to person, place, and time.     Motor: Weakness present.  Psychiatric:        Attention and Perception: Attention normal.        Mood and Affect: Mood normal.        Speech: Speech normal.        Behavior: Behavior normal.        Thought Content: Thought content normal.        Cognition and Memory: Cognition  normal.        Judgment: Judgment normal.   Prognosis: Poor in the setting of acute on chronic hypoxic/hypercapnic respiratory  failure, pCO2 110, CKD stage III, BUN 54, severe COPD exacerbation, OSA, BiPAP dependency, diastolic CHF, BNP 7543.6, atrial fibrillation, thrombocytopenia, renal cell carcinoma with pancreatic mass, and RUL pulmonary nodule, type 2 diabetes, chronic pain, deconditioning, bilateral pleural effusions, and multifocal pneumonia.  Discharge Planning:  To Be Determined  Recommendations:  Limited code (BIPAP use only) at patient's request. Does not want to transition off BiPAP until able to have a discussion with her son.   Continue to treat the treatable.  Patient is requesting to speak with son before transitioning care. She and family are leaning towards comfort/EOL support.   Educated patient and family on comfort measures, most likely anticipated hospital death if she chooses to transition to comfort and come off BiPAP. Patient and family verbalized understanding.   PMT will continue to support and follow.   Palliative Performance Scale: BIPAP DEPENDENT                 Patient and [name] expressed understanding and was in agreement with this plan.   Thank you for allowing the Palliative Medicine Team to assist in the care of this patient.  Time In: 1415 Time Out: 1545 Time Total: 90 min.   Visit consisted of counseling and education dealing with the complex and emotionally intense issues of symptom management and palliative care in the setting of serious and potentially life-threatening illness.Greater than 50%  of this time was spent counseling and coordinating care related to the above assessment and plan.  Signed by:  Alda Lea, AGPCNP-BC Palliative Medicine Team  Phone: [phone] Fax: [phone] Pager: [phone] Amion: Bjorn Pippin

## 2018-10-04 DIAGNOSIS — R229 Localized swelling, mass and lump, unspecified: Secondary | ICD-10-CM

## 2018-10-04 DIAGNOSIS — Z7189 Other specified counseling: Secondary | ICD-10-CM

## 2018-10-04 DIAGNOSIS — Z66 Do not resuscitate: Secondary | ICD-10-CM

## 2018-10-04 LAB — GLUCOSE, CAPILLARY
Glucose-Capillary: 114 mg/dL — ABNORMAL HIGH (ref 70–99)
Glucose-Capillary: 83 mg/dL (ref 70–99)
Glucose-Capillary: 83 mg/dL (ref 70–99)
Glucose-Capillary: 87 mg/dL (ref 70–99)
Glucose-Capillary: 92 mg/dL (ref 70–99)

## 2018-10-04 LAB — BASIC METABOLIC PANEL
Anion gap: 10 (ref 5–15)
BUN: 47 mg/dL — ABNORMAL HIGH (ref 8–23)
CO2: 48 mmol/L — ABNORMAL HIGH (ref 22–32)
Calcium: 8.8 mg/dL — ABNORMAL LOW (ref 8.9–10.3)
Chloride: 87 mmol/L — ABNORMAL LOW (ref 98–111)
Creatinine, Ser: 0.86 mg/dL (ref 0.44–1.00)
GFR calc Af Amer: >60 mL/min (ref >60)
GFR calc non Af Amer: >60 mL/min (ref >60)
Glucose, Bld: 96 mg/dL (ref 70–99)
Potassium: 3.1 mmol/L — ABNORMAL LOW (ref 3.5–5.1)
Sodium: 145 mmol/L (ref 135–145)

## 2018-10-04 LAB — BLOOD GAS, ARTERIAL
Acid-Base Excess: 21.9 mmol/L — ABNORMAL HIGH (ref 0.0–2.0)
Bicarbonate: 49.6 mmol/L — ABNORMAL HIGH (ref 20.0–28.0)
Delivery systems: POSITIVE
Drawn by: 54605
Expiratory PAP: 7
FIO2: 50
Inspiratory PAP: 17
O2 Saturation: 98.9 %
Patient temperature: 97.6
RATE: 8 resp/min
pCO2 arterial: 108 mmHg (ref 32.0–48.0)
pH, Arterial: 7.282 — ABNORMAL LOW (ref 7.350–7.450)
pO2, Arterial: 116 mmHg — ABNORMAL HIGH (ref 83.0–108.0)

## 2018-10-04 LAB — CBC
HCT: 29.7 % — ABNORMAL LOW (ref 36.0–46.0)
Hemoglobin: 8.1 g/dL — ABNORMAL LOW (ref 12.0–15.0)
MCH: 25.2 pg — ABNORMAL LOW (ref 26.0–34.0)
MCHC: 27.3 g/dL — ABNORMAL LOW (ref 30.0–36.0)
MCV: 92.5 fL (ref 80.0–100.0)
Platelets: 65 10*3/uL — ABNORMAL LOW (ref 150–400)
RBC: 3.21 MIL/uL — ABNORMAL LOW (ref 3.87–5.11)
RDW: 20 % — ABNORMAL HIGH (ref 11.5–15.5)
WBC: 5.8 10*3/uL (ref 4.0–10.5)
nRBC: 0 % (ref 0.0–0.2)

## 2018-10-04 LAB — NOVEL CORONAVIRUS, NAA (HOSP ORDER, SEND-OUT TO REF LAB; TAT 18-24 HRS): SARS-CoV-2, NAA: NOT DETECTED

## 2018-10-04 LAB — MAGNESIUM: Magnesium: 1.9 mg/dL (ref 1.7–2.4)

## 2018-10-04 LAB — PHOSPHORUS: Phosphorus: 4.4 mg/dL (ref 2.5–4.6)

## 2018-10-04 MED ORDER — MORPHINE SULFATE (CONCENTRATE) 10 MG/0.5ML PO SOLN: 5.0000 mg | ORAL | Status: DC | PRN

## 2018-10-04 MED ORDER — LORAZEPAM 2 MG/ML IJ SOLN: 1.0000 mg | INTRAMUSCULAR | Status: DC | PRN

## 2018-10-04 MED ORDER — POTASSIUM CHLORIDE 10 MEQ/100ML IV SOLN: 10.0000 meq | INTRAVENOUS | Status: DC

## 2018-10-04 MED ORDER — GLYCOPYRROLATE 0.2 MG/ML IJ SOLN: 0.2000 mg | INTRAMUSCULAR | Status: DC | PRN

## 2018-10-04 MED ORDER — LORAZEPAM 1 MG PO TABS: 1.0000 mg | ORAL_TABLET | ORAL | Status: DC | PRN

## 2018-10-04 MED ORDER — MORPHINE SULFATE (PF) 2 MG/ML IV SOLN: 2.0000 mg | INTRAVENOUS | Status: DC | PRN

## 2018-10-04 MED ORDER — POLYVINYL ALCOHOL 1.4 % OP SOLN
1.0000 [drp] | Freq: Four times a day (QID) | OPHTHALMIC | Status: DC | PRN
  Filled 2018-10-04: qty 15

## 2018-10-04 MED ORDER — BIOTENE DRY MOUTH MT LIQD: 15.0000 mL | OROMUCOSAL | Status: DC | PRN

## 2018-10-04 NOTE — Progress Notes (Signed)
Pt may have thins for comfort per NP P[place]pack.

## 2018-10-04 NOTE — Progress Notes (Signed)
Daily Progress Note   Patient Name: Renee Noble       Date: 10/04/2018 DOB: 1955-05-29  Age: 63 y.o. MRN#: [id number] Attending Physician: Elmarie Shiley, MD Primary Care Physician: Delorse Limber Admit Date: 09/16/2018  Reason for Consultation/Follow-up: Establishing goals of care, Non pain symptom management, Pain control and Psychosocial/spiritual support  Subjective: Patient awake, alert, and oriented x3.  Complains of some lower back pain and continuous shortness of breath.  She remains on BiPAP, and becomes extremely tachypneic and dyspneic with any types of exertion or removal of BiPAP.  Patient expressed her wishes to be taken off of BiPAP.  States that she spoke to her son briefly last night and was hoping to be able to video chat with him on today and say her goodbyes.  Patient states her wishes are to proceed with a more comfort care approach.  Although we discussed yesterday, I discussed again at length with patient what comfort care would look like. She is aware she would be able to have comfort foods if awake and able to tolerate. She was educated of the risk of aspiration also. Renee Noble verbalized understanding. We contacted her cousin (who is more like a sister), Renee Noble via phone. We discussed in details comfort measures again. Renee Noble tearful verbalized her agreement.,  Allow for patient and sister to engage in conversation.  I was able to relay information that Renee Noble was unable to understand given patient remain on BiPAP.  Renee Noble expressed to her sister her continuous love throughout the years and her bravery and making such a difficult decision to care for herself and the best manner.  Patient observed expressing to her sister her love and support over the years.   He requested of her sister to place her obituary in their hometown newspaper in [place].  Sister agreed.  Patient also expressed to her sister wishes in regards to cremation versus burial. Sister verbalized support and understanding.   Patients son called and with the use of Doximetry we were able to allow them to video conference. Patient and son tearful in discussion and the sight of seeing one another. Son expressed his love and sadness of not being able to visit as he is in [place]. Patient observed asking Jenny Reichmann, he was okay with her decisions of positioning her care  to comfort for EOL support.  He expressed to him "it is like I am in a box and it continues to get smaller and smaller.  The doctors of care for me for him many years and I am tired, my body is tired, and I have nothing left to give." Renee Noble verbalized his agreement and his wishes for her not to continue to suffer as he has watched for so long.  Patient expressed she was at peace if he was at peace with this decision and was tearful and explained to him "I am not sure of I will die today, tomorrow or in a few days but know that my love would never die for you" and expressed his love as well.  Patient and son spent time sharing memories and their love.  Patient ended call expressing her love to her son, how proud she was, and made him promise her he would continue to take good care of his wife.   Patient also requested to contact her best friend, Renee Noble. They were able to speak and also expressed their love for each other over the years. Patient asked Renee Noble for her advice on transitioning to comfort in the hospital verus hospice home. 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 She expressed to patient given her symptoms and respiratory distress she felt transitioning to comfort in the hospital would allow her better control and symptom management. She also explained to her the process of symptom management  and care in the residential hospice homes. Patient verbalized her appreciation and support of decisions. Patient and her friend spent time sharing memories and their appreciation of each other over the years.   Patient requested to transition care to full comfort with peace knowing she had spoken to everyone that was important to her. She is requesting for family to be able to visit and given her transition she is aware that family will be allowed to visit. Patient requesting to be allowed to intermittently use BiPAP machine until she has had time to visit with family this afternoon. This is a reasonable request and once family has visited she is in agreement to d/c BiPAP and continue with nasal cannula and medications for symptom support.   We discussed in detail her symptom management. She expressed she would not want to be placed on continuous morphine drip at this time as she is hopeful to be able to see her family and engage in conversation. She is in agreement to PRN usage. I educated her on the use of morphine for air hunger and the possibility for morphine drip to provide her with comfort. She verbalized understanding and expressed once she has seen family she would be in agreement.   I have called all of her family members as requested them aware of the ability to visit due to end-of-life transition.  Her son is unsure if he will be able to visit and states he is content with their video conference.  Renee Noble and Renee Noble expressed their wishes to visit.  I discussed with family members the importance of safety during this COVID-19 time. All family members briefly questioned about any signs/symptoms of COVID or any known contact with a known positive COVID individual. All denied. They are aware of the strict visitor restrictions, mandatory masking, and entrance screening.   The above conversation was completed via telephone due to the visitor restrictions during the COVID-19 pandemic. Thorough chart  review and discussion with necessary members  of the care team was completed as part of assessment.    Length of Stay: 14  Current Medications: Scheduled Meds:   acetaminophen  650 mg Oral Q6H   ammonium lactate   Topical BID   chlorhexidine gluconate (MEDLINE KIT)  15 mL Mouth Rinse BID   Chlorhexidine Gluconate Cloth  6 each Topical Daily   diclofenac sodium  2 g Topical QID   famotidine  20 mg Oral BID   furosemide  40 mg Intravenous BID   lidocaine  1 patch Transdermal Q24H   mometasone-formoterol  2 puff Inhalation BID   nortriptyline  25 mg Oral QHS   polyethylene glycol  17 g Oral BID   senna-docusate  2 tablet Oral BID    Continuous Infusions:  sodium chloride 10 mL/hr at 10/03/18 1821    PRN Meds: sodium chloride, albuterol, antiseptic oral rinse, bisacodyl, dextromethorphan-guaiFENesin, glycopyrrolate, ipratropium-albuterol, lactulose, LORazepam **OR** LORazepam, morphine injection, morphine CONCENTRATE **OR** morphine CONCENTRATE, ondansetron (ZOFRAN) IV, polyvinyl alcohol, Resource ThickenUp Clear  Physical Exam   General: morbidly obese, chronically-ill appearing Cardiovascular: RRR, bilateral lower extremity edema/weeping Pulmonary: Tachypnea, BiPAP, diminished bases Skin:Scattered bruising to upper extremities, lower extremity discoloration        Vital Signs: BP (!) 140/52 (BP Location: Left Arm)    Pulse 97    Temp 97.7 F (36.5 C) (Axillary)    Resp 18    Ht '5\' 4"'$  (1.626 m)    Wt 134.2 kg    SpO2 96%    BMI 50.78 kg/m  SpO2: SpO2: 96 % O2 Device: O2 Device: Bi-PAP O2 Flow Rate: O2 Flow Rate (L/min): (S) 50 L/min(patient having desAturations on 30-40%)  Intake/output summary:   Intake/Output Summary (Last 24 hours) at 10/04/2018 1511 Last data filed at 10/04/2018 0449 Gross per 24 hour  Intake --  Output 1500 ml  Net -1500 ml   LBM: Last BM Date: 10/03/18 Baseline Weight: Weight: 136 kg Most recent weight: Weight: 134.2 kg        Palliative Assessment/Data: BIPAP    Flowsheet Rows     Most Recent Value  Intake Tab  Referral Department  Hospitalist  Unit at Time of Referral  Intermediate Care Unit  Palliative Care Primary Diagnosis  Pulmonary  Date Notified  10/02/18  Palliative Care Type  Return patient Palliative Care  Reason for referral  Clarify Goals of Care  Date of Admission  09/26/2018  # of days IP prior to Palliative referral  12  Clinical Assessment  Psychosocial & Spiritual Assessment  Palliative Care Outcomes      Patient Active Problem List   Diagnosis Date Noted   Hyperkalemia    Respiratory failure with hypercapnia ([place]) 10/06/2018   Palliative care encounter    Endotracheal tube present    Pressure ulcer 08/30/2018   Acute exacerbation of chronic obstructive pulmonary disease (COPD) ([place]) 08/28/2018   Atrial flutter with rapid ventricular response ([place]) 08/28/2018   AKI (acute kidney injury) ([place]) 08/28/2018   Wound of thigh 08/15/2018   Pressure injury of skin 07/17/2018   Hypoglycemia due to insulin 07/14/2018   Acute on chronic respiratory failure with hypercapnia ([place]) 06/09/2018   HLD (hyperlipidemia) 06/09/2018   Hypothyroidism 06/09/2018   Depression 06/09/2018   Fibromyalgia    Lung nodule 05/08/2018   Acute on chronic congestive heart failure ([place])    Morbid obesity with BMI of 50.0-59.9, adult ([place]) 04/10/2018   Chronic pain 04/07/2018   Acute on chronic diastolic CHF (congestive heart failure) ([place])  04/07/2018   PNA (pneumonia) 03/21/2018   Chronic obstructive pulmonary disease ([place]) 02/25/2018   Chronic pain syndrome    Normocytic anemia 02/24/2018   Acute on chronic respiratory failure with hypoxia and hypercapnia ([facility]) 02/24/2018   OSA (obstructive sleep apnea) 08/16/2017   Morbid obesity due to excess calories ([place]) 04/29/2017   Cor pulmonale (chronic) ([place]) 04/29/2017   Chronic respiratory failure with hypoxia and hypercapnia  ([place]) 04/28/2017   CKD (chronic kidney disease), stage III ([place]) 04/14/2016   Hyperlipidemia 01/16/2016   Essential hypertension 01/16/2016   Hypoxia    Thrombocytopenia ([place]) 07/12/2015   Thyroid activity decreased 06/23/2015   Encounter for orogastric (OG) tube placement 09/02/2014   Hernia of abdominal cavity 07/30/2014   Abnormal CXR 02/15/2014   COPD GOLDIII with min reversibility  12/16/2013   History of hepatitis C 12/16/2013   Breast cancer screening 12/16/2013   Type II diabetes mellitus with renal manifestations ([place]) 12/16/2013   Screening for osteoporosis 12/16/2013    Palliative Care Assessment & Plan   Patient Profile: Palliative Care consult requested for this 63 y.o. female with multiple medical problems including COPD (3L at home), diastolic CHF, chronic hypoxic/hypercarbic respiratory failure, OSA, paroxysmal atrial fibrillation (not on anticoagulants due to chronic thrombocytopenia), type 2 diabetes, anemia, obesity, apical compression fracture, chronic kidney disease stage III, fibromyalgia, hepatitis C, hypertension, neuropathy, osteoarthritis, venous stasis, and renal cancer.  Patient was recently hospitalized for acute on chronic respiratory failure requiring intubation x2.  Patient admitted with similar presentation now including hyperkalemia.  Continues to be dependent on BiPAP for respiratory support.  Recommendations/Plan:  DNR/DNI-as requested by patient  She is requesting ability to continue with intermittent BiPAP usage until her family is able to come visit. She is aware of the risk and knows she would not be able to eat and immediately be placed on BiPAP.   Patient had extensive phone conversations and video chat with family. Video conference with her son Jenny Reichmann for more than 20 min, and also spoke with her sister via speaker phone and assistance, as well as her best friend. Patient expressed her wishes and all family tearful but verbalized  their support of her decision and agreement. Patient and Renee Noble discussed funeral arrangements and obituary.   Patient has requested to transition care to comfort. Aware of anticipated hospital death versus possibility of transferring to residential hospice if symptoms are managed/stable.   Family is approved for visitations due to comfort/EOL transition.   Will d/c orders not comfort focused.   Morphine as needed for air hunger/pain  Ativan PRN for anxiety/agitation  Robinul PRN for excessive secretions  Liquifilm Tears as needed for dry eyes  Patient may have comfort food/D3 diet  Intermittent BiPAP for comfort until family arrives and visits, then may discontinue.  Oxygen via nasal cannula for respiratory support.  PMT will continue to support and follow  Goals of Care and Additional Recommendations:  Limitations on Scope of Treatment: Full Comfort Care and Minimize Medications  Code Status:    Code Status Orders  (From admission, onward)         Start     Ordered   10/04/18 1504  Do not attempt resuscitation (DNR)  Continuous    Question Answer Comment  In the event of cardiac or respiratory ARREST Do not call a code blue   In the event of cardiac or respiratory ARREST Do not perform Intubation, CPR, defibrillation or ACLS   In the event of cardiac or respiratory ARREST  Use medication by any route, position, wound care, and other measures to relive pain and suffering. May use oxygen, suction and manual treatment of airway obstruction as needed for comfort.      10/04/18 1507        Code Status History    Date Active Date Inactive Code Status Order ID Comments User Context   10/04/2018 1459 10/04/2018 1507 DNR [id number]  Jimmy Footman, NP Inpatient   10/03/2018 1509 10/04/2018 1459 Partial Code [id number]  Jimmy Footman, NP Inpatient   09/29/2018 2358 10/03/2018 1509 Full Code [id number]  Arnell Asal, NP ED   08/28/2018 2102 09/16/2018  1831 Full Code [id number]  Howerter, Ethelda Chick, DO ED   07/15/2018 0028 07/17/2018 1616 Full Code [id number]  Phillips Grout, MD ED   06/09/2018 0034 06/15/2018 1836 Full Code [id number]  Ivor Costa, MD ED   04/07/2018 2041 04/11/2018 1742 Full Code [id number]  Truett Mainland, DO Inpatient   02/24/2018 2355 03/02/2018 2023 Full Code [id number]  [name], MD Inpatient   07/11/2015 1846 07/17/2015 1511 Full Code [id number]  Debbe Odea, MD ED    Advance Directive Documentation     Most Recent Value  Type of Advance Directive  Healthcare Power of Attorney  Pre-existing out of facility DNR order (yellow form or pink MOST form)  --  "MOST" Form in Place?  --     Prognosis:   Hours - Days in the setting of full comfort care.   Discharge Planning:  Anticipated Hospital Death versus residential hospice if symptoms are manage and stable.   Care plan was discussed with patient, her sister, Renee Noble, bedside RN, and message sent to Dr. Beckey Downing, NP.   Thank you for allowing the Palliative Medicine Team to assist in the care of this patient.  Time In: 1130 Time Out: 1345 Total Time: 135 min  Greater than 50%  of this time was spent counseling and coordinating care related to the above assessment and plan.  Alda Lea, AGPCNP-BC Palliative Medicine Team  Phone: [phone] Pager: [phone] Amion: Bjorn Pippin    Please contact Palliative Medicine Team phone at [phone] for questions and concerns.

## 2018-10-04 NOTE — Progress Notes (Signed)
Attempted to call son, Jenny Reichmann late yesterday evening and first thing this morning with no answer or ability to leave a voicemail [phone]). Will continue to make attempts as patient wishes to speak with him before making decisions to transition to comfort.   Elder Love, NP Palliative Medicine  [phone]

## 2018-10-04 NOTE — Progress Notes (Signed)
On call physician paged regarding patient's morning blood gas results

## 2018-10-04 NOTE — Progress Notes (Signed)
SLP Cancellation Note  Patient Details Name: Renee Noble MRN: [id number] DOB: 03-09-1956   Cancelled treatment:   Pt remains dependent upon BiPAP to maintain 02 sats ; Palliative care consult reviewed.  SLP will follow for plan; pt remains NPO given condition.         Juan Quam Laurice 10/04/2018, 9:26 AM

## 2018-10-04 NOTE — Progress Notes (Signed)
PROGRESS NOTE    Renee Noble  PJA:[id number] DOB: 12/18/1955 DOA: 10/11/2018 PCP: [name], PA-C    Brief Narrative: 63 year old with past medical history significant for chronic hypoxic and hypercarbic respiratory failure on 4 L of oxygen at home, COPD, OHS, OSA on CPAP, chronic diastolic heart failure, paroxysmal A. fib, diabetes, thrombocytopenia, anemia of chronic disease admitted on 5/6 with altered mental status and hyperkalemia found to be in acute on chronic respiratory failure.  COVID-19 test was negative. In the ED patient was noted to be tachypneic with altered mental status.  ABG with compensated respiratory acidosis and hypoxemia.  Potassium 7.2.  Serum creatinine 1.6.  Hemoglobin 8.1.  Platelet 92.  BNP 1090.  EKG with A. fib and rate at 87.  She was treated with IV Lasix, placed on BiPAP.  Hyperkalemia was treated with temporizing measures.  She was admitted to ICU.  In the ICU she was continued on BiPAP, breathing treatment and diuretics.  She was transferred back to [facility] on 5/9.  She had a CT abdomen that showed pancreatic and renal mass.  She had a renal biopsy that showed renal cell carcinoma.  Oncology was consulted but did not feel she could be a good candidate for aggressive intervention regarding her malignancies.  Gastroenterologist was also consulted about the pancreatic mass for potential biopsy.  However patient had worsening encephalopathy on 5/15 and she was moved back to ICU on that intubated.  Patient was extubated on 5/17 and [facility] service care on 5/18. Patient continued to decline and develop recurrent hypercapnic respiratory failure.  She is now dependent on BiPAP.  CCM was consulted and they agree with palliative care consult as well. Palliative care was consulted and at this point we are waiting for patient to be able to speak with her son prior to discontinuation of BiPAP.  Assessment & Plan:   Active Problems:   Acute on chronic respiratory failure with  hypoxia and hypercapnia (HCC)   Respiratory failure with hypercapnia (HCC)   Hyperkalemia  1-Acute on chronic hypoxic/hypercapnic respiratory failure, severe COPD exacerbation, OSA/OHS on nightly CPAP -Requiring intubation with most recent one from 5/15 to 5/17. -Patient with recurrent hypercapnic hypoxic respiratory failure, now BiPAP dependent. -She was a started on ceftriaxone and azithromycin on 5/16. -Continue with duo nebs, scheduled Dulera._0 -She complete her course of IV steroids. -PCO2 elevated on blood gas this morning.  Discussed with respiratory therapy were going to increase respiration rate. -Patient awaiting to speak with son prior to discontinuation of BiPAP and moving to full comfort care  2-Acute on chronic diastolic heart failure exacerbation: TTE 5/7 EF 50-55% , decreased RV systolic fxn , RV severe dilation  -TTE 5/15 EF 60-65%, reduced RV systolic fxn, mild RV enlargement, moderate biatrial  -BNP ~1090.4.  CXR and CT chest concerning for pulmonary congestion. -Continue with IV Lasix. -Good urine output: 2.3 L overnight.  -16 L  3-Permanent A. fib: Rate controlled.  Not on anticoagulation due to thrombocytopenia. Continue with diltiazem.   4-Renal cell carcinoma/pancreatic mass/right upper pulmonary nodule: Patient was evaluated by oncology, likely not a candidate for aggressive intervention. Oncology recommended MRI versus endoscopic ultrasound biopsy for the pancreatic mass given her co morbidities but patient did not tolerate MRI.[name] Kitchen Renal inferior pole mass concerning for malignancy and suggestion of extension of tumor thrombus into right renal vein and possible IVC.  -Palliative care has been consulted.  Cytopenia, with anemia and thrombocytopenia; Status post Granix.  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  Continue with a sliding scale insulin  Hypothyroidism: Continue with  Synthroid  History of depression: Chronic pain: Continue Voltaren and lidocaine patch nortriptyline.  Acute toxic and metabolic encephalopathy: Multifactorial medications sedative that has been reduced.  Respiratory failure.  Goals of care: Patient was admitted with acute on chronic respiratory failure, requiring intubation now with renal cell cancer and possible pancreatic cancer.  Per oncology not a good candidate for aggressive intervention, palliative care has been consulted, patient does not want to be intubated she wants to remain on BiPAP on and off until her family arrive.   CVI, moisture associated skin damage, healed full thickness pressure injury to left ischial tuberosity Pressure Injury POA: Yes Measurement: Left IT: 3cm x 2.5cm newly reepithelialized Left lateral LE: Healed VLU. Staff noted weeping at area near knee today, no longer weeping. Right medial LE: 3cm x 2.5cm dry, crusted ulcer. Unable to assess depth due to the presence of dried serum (scab) vs eschar Intertriginous skin loss at the gluteal cleft: partial thickness two area measuring 3cm x 0.4cm x 0.2cm with pink, moist wound bed. Scant serous to serosanguinous exudate. Wound bed:As described above -Appreciate wound care input.   Morbid obesity, BMI 52: Multiple comorbidities Appreciate nutritionist input    Pressure Injury 08/28/18 Stage II -  Partial thickness loss of dermis presenting as a shallow open ulcer with a red, pink wound bed without slough. (Active)  08/28/18 2230  Location: Ischial tuberosity  Location Orientation: Right  Staging: Stage II -  Partial thickness loss of dermis presenting as a shallow open ulcer with a red, pink wound bed without slough.  Wound Description (Comments):   Present on Admission: No     Pressure Injury 08/28/18 Stage II -  Partial thickness loss of dermis presenting as a shallow open ulcer with a red, pink wound bed without slough. (Active)  08/28/18 2230   Location: Ischial tuberosity  Location Orientation: Left  Staging: Stage II -  Partial thickness loss of dermis presenting as a shallow open ulcer with a red, pink wound bed without slough.  Wound Description (Comments):   Present on Admission: No     Pressure Injury 09/12/18 Stage II -  Partial thickness loss of dermis presenting as a shallow open ulcer with a red, pink wound bed without slough. (Active)  09/12/18 1300  Location: Buttocks  Location Orientation: Medial  Staging: Stage II -  Partial thickness loss of dermis presenting as a shallow open ulcer with a red, pink wound bed without slough.  Wound Description (Comments):   Present on Admission: No     Nutrition Problem: Increased nutrient needs Etiology: chronic illness, wound healing    Signs/Symptoms: estimated needs    Interventions: MVI, Premier Protein  Estimated body mass index is 50.78 kg/m as calculated from the following:   Height as of this encounter: 5' 4" (1.626 m).   Weight as of this encounter: 134.2 kg.   DVT prophylaxis: SCDs Code Status: Partial code, does not want to be intubated only  BiPAP Family Communication: Family at bedside, palliative coordinating care with family Disposition Plan: Suspect hospital death  Consultants:   PCCM  Nephrology on 5/7  Oncology on 5/11  Gastroenterology on 5/13  IR on 5/14  Palliative care on 5/18   Procedures:  EGD 5/15-5/17  CT-guided renal biopsy on 5/14   Antimicrobials:  5/6 BC x 2 >>NEG x  5/6 BC -  Staph species (coag neg )  5/6 COVID >> neg 5/16 Moderate gram variable rod, final pending    Subjective: This morning, she is on BiPAP.  She follows command.  She makes signs and said that she wants to eat and drink water.  Objective: Vitals:   10/04/18 0809 10/04/18 0902 10/04/18 1046 10/04/18 1145  BP: (!) 129/54 (!) 121/51 (!) 121/51 (!) 140/52  Pulse: 98 94 98 97  Resp: (!) 21 (!) _0 Temp: (!) 97.3 F (36.3 C)   97.7  F (36.5 C)  TempSrc: Axillary   Axillary  SpO2: 100% 100% 100% 96%  Weight:      Height:        Intake/Output Summary (Last 24 hours) at 10/04/2018 1348 Last data filed at 10/04/2018 0449 Gross per 24 hour  Intake 50 ml  Output 2350 ml  Net -2300 ml   Filed Weights   10/01/18 0359 10/02/18 0300 10/03/18 0400  Weight: 133.4 kg (!) 137.3 kg 134.2 kg    Examination:  General exam: Morbidly obese, on BiPAP Respiratory system: Decreased breath sounds Cardiovascular system: S1 & S2 heard, RRR. No JVD, murmurs, rubs, gallops or clicks. No pedal edema. Gastrointestinal system: Abdomen is nondistended, soft and nontender. No organomegaly or masses felt. Normal bowel sounds heard. Central nervous system: Alert.  Extremities: Symmetric 5 x 5 power. Skin: No rashes, lesions or ulcers    Data Reviewed: I have personally reviewed following labs and imaging studies  CBC: Recent Labs  Lab 09/28/18 0420 09/29/18 0423  09/30/18 0357 10/01/18 0345 10/02/18 0645 10/03/18 0449 10/04/18 0605  WBC 8.1 9.5  --  6.8 7.8 7.8 6.6 5.8  NEUTROABS 7.0 8.8*  --  5.3  --   --   --   --   HGB 8.7* 8.8*   < > 7.8* 8.2* 8.4* 8.3* 8.1*  HCT 31.5* 32.6*   < > 27.4* 28.7* 30.4* 30.6* 29.7*  MCV 90.5 92.1  --  87.8 87.0 89.9 90.5 92.5  PLT 101* 121*  --  101* 94* 87* 75* 65*   < > = values in this interval not displayed.   Basic Metabolic Panel: Recent Labs  Lab 09/30/18 0357 10/01/18 0345 10/02/18 0645 10/03/18 0449 10/04/18 0605  NA 138 140 140 142 145  K 4.7 3.4* 3.6 3.5 3.1*  CL 84* 85* 84* 84* 87*  CO2 46* 47* 47* 48* 48*  GLUCOSE 130* 153* 107* 92 96  BUN 71* 76* 64* 54* 47*  CREATININE 1.42* 1.25* 1.04* 0.90 0.86  CALCIUM 8.7* 8.5* 8.4* 8.6* 8.8*  MG 1.9 1.8 1.8 1.9 1.9  PHOS 2.9 2.7 3.6 3.9 4.4   GFR: Estimated Creatinine Clearance: 89.1 mL/min (by C-G formula based on SCr of 0.86 mg/dL). Liver Function Tests: Recent Labs  Lab 09/29/18 0423  ALBUMIN 3.1*   No results  for input(s): LIPASE, AMYLASE in the last 168 hours. No results for input(s): AMMONIA in the last 168 hours. Coagulation Profile: Recent Labs  Lab 09/27/18 1634  INR 1.1   Cardiac Enzymes: No results for input(s): CKTOTAL, CKMB, CKMBINDEX, TROPONINI in the last 168 hours. BNP (last 3 results) No results for input(s): PROBNP in the last 8760 hours. HbA1C: Recent Labs    10/02/18 1404  HGBA1C 5.3   CBG: Recent Labs  Lab 10/03/18 1637 10/03/18 2021 10/04/18 0012 10/04/18 0442 10/04/18 0835  GLUCAP 92 78 92 83 87   Lipid Profile: No results for input(s): CHOL, HDL, LDLCALC, TRIG,  CHOLHDL, LDLDIRECT in the last 72 hours. Thyroid Function Tests: No results for input(s): TSH, T4TOTAL, FREET4, T3FREE, THYROIDAB in the last 72 hours. Anemia Panel: No results for input(s): VITAMINB12, FOLATE, FERRITIN, TIBC, IRON, RETICCTPCT in the last 72 hours. Sepsis Labs: No results for input(s): PROCALCITON, LATICACIDVEN in the last 168 hours.  Recent Results (from the past 240 hour(s))  Culture, blood (routine x 2)     Status: None (Preliminary result)   Collection Time: 09/30/18 11:24 AM  Result Value Ref Range Status   Specimen Description BLOOD LEFT ANTECUBITAL  Final   Special Requests   Final    BOTTLES DRAWN AEROBIC ONLY Blood Culture adequate volume   Culture   Final    NO GROWTH 3 DAYS Performed at [facility], [address] [place], [place] [address]    Report Status PENDING  Incomplete  Culture, blood (routine x 2)     Status: None (Preliminary result)   Collection Time: 09/30/18 11:24 AM  Result Value Ref Range Status   Specimen Description BLOOD LEFT HAND  Final   Special Requests   Final    BOTTLES DRAWN AEROBIC ONLY Blood Culture results may not be optimal due to an inadequate volume of blood received in culture bottles   Culture   Final    NO GROWTH 3 DAYS Performed at [facility], [place] [address], [place], [place] [address]    Report Status PENDING   Incomplete  Culture, respiratory     Status: None   Collection Time: 09/30/18  2:01 PM  Result Value Ref Range Status   Specimen Description TRACHEAL ASPIRATE  Final   Special Requests Normal  Final   Gram Stain   Final    MODERATE WBC PRESENT, PREDOMINANTLY PMN MODERATE GRAM VARIABLE ROD    Culture   Final    ABUNDANT DIPHTHEROIDS(CORYNEBACTERIUM SPECIES) Standardized susceptibility testing for this organism is not available. Performed at [facility], [place] [address], [place], [place] [address]    Report Status 10/02/2018 FINAL  Final  Novel Coronavirus, NAA (hospital order; send-out to ref lab)     Status: None   Collection Time: 10/03/18 12:03 AM  Result Value Ref Range Status   SARS-CoV-2, NAA NOT DETECTED NOT DETECTED Final    Comment: (NOTE) This test was developed and its performance characteristics determined by [employer]. This test has not been FDA cleared or approved. This test has been authorized by FDA under an Emergency Use Authorization (EUA). This test is only authorized for the duration of time the declaration that circumstances exist justifying the authorization of the emergency use of in vitro diagnostic tests for detection of SARS-CoV-2 virus and/or diagnosis of COVID-19 infection under section 564(b)(1) of the Act, 21 U.S.C. 616OHF-2(B)(0), unless the authorization is terminated or revoked sooner. When diagnostic testing is negative, the possibility of a false negative result should be considered in the context of a patient's recent exposures and the presence of clinical signs and symptoms consistent with COVID-19. An individual without symptoms of COVID-19 and who is not shedding SARS-CoV-2 virus would expect to have a negative (not detected) result in this assay. Performed  At: [facility] [address] [place], [place] [id number] Rush Farmer MD YE:[id number]    [place]  Final    Comment:  Performed at [facility], [place] [address], [place], [place] [address]         Radiology Studies: Dg Chest [place] 1  View  Result Date: 10/03/2018 CLINICAL DATA:  Dyspnea. History of COPD, congestive heart failure and hypertension. EXAM: PORTABLE CHEST 1 VIEW COMPARISON:  09/30/2018 and 09/29/2018 radiographs.  CT 09/27/2018. FINDINGS: 1212 hours. Interval extubation and removal of the enteric tube. The right arm PICC is unchanged at the level of the upper SVC. Patchy bilateral airspace opacities appear minimally improved over the last 3 days. There is an asymmetric retrocardiac component which may be due to superimposed atelectasis. There are probable small bilateral pleural effusions. No pneumothorax. The bones appear unchanged. IMPRESSION: 1. Slight improvement in bilateral airspace opacities following extubation. Multifocal pneumonia favored based on prior CT appearance. 2. Small residual bilateral pleural effusions. Electronically Signed   By: Richardean Sale M.D.   On: 10/03/2018 12:43        Scheduled Meds: . acetaminophen  650 mg Oral Q6H  . acetaZOLAMIDE  250 mg Oral BID  . ammonium lactate   Topical BID  . azithromycin  250 mg Oral Daily  . chlorhexidine gluconate (MEDLINE KIT)  15 mL Mouth Rinse BID  . Chlorhexidine Gluconate Cloth  6 each Topical Daily  . diclofenac sodium  2 g Topical QID  . famotidine  20 mg Oral BID  . furosemide  40 mg Intravenous BID  . insulin aspart  3-9 Units Subcutaneous TID WC  . levothyroxine  88 mcg Oral Q0600  . lidocaine  1 patch Transdermal Q24H  . metoprolol tartrate  5 mg Intravenous Q8H  . mometasone-formoterol  2 puff Inhalation BID  . multivitamin with minerals  1 tablet Oral Daily  . nortriptyline  25 mg Oral QHS  . polyethylene glycol  17 g Oral BID  . pravastatin  20 mg Oral Daily  . Ensure Max Protein  11 oz Oral BID  . senna-docusate  2 tablet Oral BID  . sodium chloride flush  10-40 mL Intracatheter Q12H   Continuous  Infusions: . sodium chloride 10 mL/hr at 10/03/18 1821     LOS: 14 days    Time spent: 35 minutes.     Elmarie Shiley, MD [facility] Pager [phone]  If 7PM-7AM, please contact night-coverage [email or web address] Password [email or web address] 10/04/2018, 1:48 PM

## 2018-10-04 NOTE — Progress Notes (Signed)
Rt note: ABG done on Bipap 17/7 r8 50% patient stayed on bipap all night, no other problems noted from nurse

## 2018-10-05 DIAGNOSIS — D72819 Decreased white blood cell count, unspecified: Secondary | ICD-10-CM

## 2018-10-05 LAB — CULTURE, BLOOD (ROUTINE X 2)
Culture: NO GROWTH
Culture: NO GROWTH
Special Requests: ADEQUATE

## 2018-10-05 MED ORDER — MORPHINE 100MG IN NS 100ML (1MG/ML) PREMIX INFUSION
2.0000 mg/h | INTRAVENOUS | Status: DC
  Administered 2018-10-05: 3 mg/h via INTRAVENOUS
  Filled 2018-10-05 (×2): qty 100

## 2018-10-05 MED ORDER — MORPHINE BOLUS VIA INFUSION
2.0000 mg | INTRAVENOUS | Status: DC | PRN
  Filled 2018-10-05: qty 2

## 2018-10-05 MED ORDER — MORPHINE SULFATE (PF) 2 MG/ML IV SOLN
2.0000 mg | INTRAVENOUS | Status: DC | PRN
  Administered 2018-10-05: 4 mg via INTRAVENOUS
  Filled 2018-10-05: qty 2

## 2018-10-05 MED ORDER — ACETAMINOPHEN 325 MG PO TABS: 650.0000 mg | ORAL_TABLET | Freq: Four times a day (QID) | ORAL | Status: DC | PRN

## 2018-10-05 MED ORDER — MOMETASONE FURO-FORMOTEROL FUM 200-5 MCG/ACT IN AERO
2.0000 | INHALATION_SPRAY | Freq: Two times a day (BID) | RESPIRATORY_TRACT | Status: DC | PRN
  Filled 2018-10-05: qty 8.8

## 2018-10-05 MED ORDER — POLYETHYLENE GLYCOL 3350 17 G PO PACK: 17.0000 g | PACK | Freq: Every day | ORAL | Status: DC | PRN

## 2018-10-05 MED ORDER — DICLOFENAC SODIUM 1 % TD GEL
2.0000 g | Freq: Four times a day (QID) | TRANSDERMAL | Status: DC | PRN
  Filled 2018-10-05: qty 100

## 2018-10-05 MED ORDER — SENNOSIDES-DOCUSATE SODIUM 8.6-50 MG PO TABS: 2.0000 | ORAL_TABLET | Freq: Two times a day (BID) | ORAL | Status: DC | PRN

## 2018-10-05 NOTE — Progress Notes (Signed)
Nutrition Brief Note  Chart reviewed. Pt now transitioning to comfort care.  No further nutrition interventions warranted at this time.   Chaquita Basques, MS, RD, LDN [facility] Clinical Dietitian Pager: [phone] After Hours Pager: [phone]   

## 2018-10-05 NOTE — Care Management Important Message (Signed)
Important Message  Patient Details  Name: Renee Noble MRN: [id number] Date of Birth: 16-Jul-1955   Medicare Important Message Given:  Yes    [name] 10/05/2018, 11:01 AM

## 2018-10-05 NOTE — Progress Notes (Signed)
PROGRESS NOTE    Renee Noble  HEN:[id number] DOB: 22-Jul-1955 DOA: 10/15/2018 PCP: [name], PA-C    Brief Narrative: 63 year old with past medical history significant for chronic hypoxic and hypercarbic respiratory failure on 4 L of oxygen at home, COPD, OHS, OSA on CPAP, chronic diastolic heart failure, paroxysmal A. fib, diabetes, thrombocytopenia, anemia of chronic disease admitted on 5/6 with altered mental status and hyperkalemia found to be in acute on chronic respiratory failure.  COVID-19 test was negative. In the ED patient was noted to be tachypneic with altered mental status.  ABG with compensated respiratory acidosis and hypoxemia.  Potassium 7.2.  Serum creatinine 1.6.  Hemoglobin 8.1.  Platelet 92.  BNP 1090.  EKG with A. fib and rate at 87.  She was treated with IV Lasix, placed on BiPAP.  Hyperkalemia was treated with temporizing measures.  She was admitted to ICU.  In the ICU she was continued on BiPAP, breathing treatment and diuretics.  She was transferred back to [facility] on 5/9.  She had a CT abdomen that showed pancreatic and renal mass.  She had a renal biopsy that showed renal cell carcinoma.  Oncology was consulted but did not feel she could be a good candidate for aggressive intervention regarding her malignancies.  Gastroenterologist was also consulted about the pancreatic mass for potential biopsy.  However patient had worsening encephalopathy on 5/15 and she was moved back to ICU on that intubated.  Patient was extubated on 5/17 and [facility] service care on 5/18. Patient continued to decline and develop recurrent hypercapnic respiratory failure.  She is now dependent on BiPAP.  CCM was consulted and they agree with palliative care consult as well. Patient was able to speak with son over the phone, per palliative care report.  Patient is currently refusing to wear BiPAP. Patient was able to see sister 5-20. Son Laurian Brim be able to come to hospital, per palliative. Plan is to  discontinue BIPAP.   Assessment & Plan:   Active Problems:   Acute on chronic respiratory failure with hypoxia and hypercapnia (HCC)   Respiratory failure with hypercapnia (HCC)   Hyperkalemia  1-Acute on chronic hypoxic/hypercapnic respiratory failure, severe COPD exacerbation, OSA/OHS on nightly CPAP -Requiring intubation with most recent one from 5/15 to 5/17. -Patient with recurrent hypercapnic hypoxic respiratory failure, now BiPAP dependent. -She was a started on ceftriaxone and azithromycin on 5/16. -Continue with duo nebs, scheduled Dulera.\ -She complete her course of IV steroids. -PCO2 elevated on blood gas this morning.  Discussed with respiratory therapy were going to increase respiration rate. -Plan was for son to visit patient yesterday afternoon to discontinue BiPAP after.  Son has not been able to be by.  Patient is currently declining BiPAP.[name] Kitchen   2-Acute on chronic diastolic heart failure exacerbation: TTE 5/7 EF 50-55% , decreased RV systolic fxn , RV severe dilation  -TTE 5/15 EF 60-65%, reduced RV systolic fxn, mild RV enlargement, moderate biatrial  -BNP ~1090.4.  CXR and CT chest concerning for pulmonary congestion. -Continue with IV Lasix. -Good urine output: 2.3 L overnight.  -18 L  3-Permanent A. fib: Rate controlled.  Not on anticoagulation due to thrombocytopenia. Continue with diltiazem.   4-Renal cell carcinoma/pancreatic mass/right upper pulmonary nodule: Patient was evaluated by oncology, likely not a candidate for aggressive intervention. Oncology recommended MRI versus endoscopic ultrasound biopsy for the pancreatic mass given her co morbidities but patient did not tolerate MRI.[name] Kitchen Renal inferior pole mass concerning for malignancy and suggestion of extension  of tumor thrombus into right renal vein and possible IVC.  -Palliative care has been consulted and has been following patient.   Cytopenia, with anemia and thrombocytopenia; Status post Granix.   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  Continue with a sliding scale insulin  Hypothyroidism: Continue with Synthroid  History of depression: Chronic pain: Continue Voltaren and lidocaine patch nortriptyline.  Acute toxic and metabolic encephalopathy: Multifactorial medications sedative that has been reduced.  Respiratory failure.  Goals of care: Patient was admitted with acute on chronic respiratory failure, requiring intubation now with renal cell cancer and possible pancreatic cancer.  Per oncology not a good candidate for aggressive intervention, palliative care has been consulted, patient does not want to be intubated she wants to remain on BiPAP on and off until her family arrive.   CVI, moisture associated skin damage, healed full thickness pressure injury to left ischial tuberosity Pressure Injury POA: Yes Measurement: Left IT: 3cm x 2.5cm newly reepithelialized Left lateral LE: Healed VLU. Staff noted weeping at area near knee today, no longer weeping. Right medial LE: 3cm x 2.5cm dry, crusted ulcer. Unable to assess depth due to the presence of dried serum (scab) vs eschar Intertriginous skin loss at the gluteal cleft: partial thickness two area measuring 3cm x 0.4cm x 0.2cm with pink, moist wound bed. Scant serous to serosanguinous exudate. Wound bed:As described above -Appreciate wound care input.   Morbid obesity, BMI 52: Multiple comorbidities Appreciate nutritionist input    Pressure Injury 08/28/18 Stage II -  Partial thickness loss of dermis presenting as a shallow open ulcer with a red, pink wound bed without slough. (Active)  08/28/18 2230  Location: Ischial tuberosity  Location Orientation: Right  Staging: Stage II -  Partial thickness loss of dermis presenting as a shallow open ulcer with a red, pink wound bed without slough.  Wound Description (Comments):   Present on  Admission: No     Pressure Injury 08/28/18 Stage II -  Partial thickness loss of dermis presenting as a shallow open ulcer with a red, pink wound bed without slough. (Active)  08/28/18 2230  Location: Ischial tuberosity  Location Orientation: Left  Staging: Stage II -  Partial thickness loss of dermis presenting as a shallow open ulcer with a red, pink wound bed without slough.  Wound Description (Comments):   Present on Admission: No     Pressure Injury 09/12/18 Stage II -  Partial thickness loss of dermis presenting as a shallow open ulcer with a red, pink wound bed without slough. (Active)  09/12/18 1300  Location: Buttocks  Location Orientation: Medial  Staging: Stage II -  Partial thickness loss of dermis presenting as a shallow open ulcer with a red, pink wound bed without slough.  Wound Description (Comments):   Present on Admission: No     Nutrition Problem: Increased nutrient needs Etiology: chronic illness, wound healing    Signs/Symptoms: estimated needs    Interventions: MVI, Premier Protein  Estimated body mass index is 50.78 kg/m as calculated from the following:   Height as of this encounter: '5\' 4"'$  (1.626 m).   Weight as of this encounter: 134.2 kg.   DVT prophylaxis: SCDs Code Status: Partial code, does not want to be intubated only  BiPAP Family Communication: palliative coordinating care with family Disposition Plan: Suspect hospital death  Consultants:   PCCM  Nephrology on 5/7  Oncology on 5/11  Gastroenterology on 5/13  IR on 5/14  Palliative care on 5/18   Procedures:  EGD 5/15-5/17  CT-guided renal biopsy on 5/14   Antimicrobials:  5/6 BC x 2 >>NEG x  5/6 BC -Staph species (coag neg )  5/6 COVID >> neg 5/16 Moderate gram variable rod, final pending    Subjective: Patient is sleepy this am. She is  Refusing to use BIPAP.   Objective: Vitals:   10/04/18 1628 10/04/18 1951 10/04/18 2032 10/05/18 0809  BP: 110/78 (!)  136/53    Pulse: 96 94 95 95  Resp: '15 15 20 20  '$ Temp:  98 F (36.7 C)    TempSrc:  Oral    SpO2: 100% 100% 98% 95%  Weight:      Height:        Intake/Output Summary (Last 24 hours) at 10/05/2018 1046 Last data filed at 10/05/2018 8127 Gross per 24 hour  Intake 68 ml  Output 1900 ml  Net -1832 ml   Filed Weights   10/01/18 0359 10/02/18 0300 10/03/18 0400  Weight: 133.4 kg (!) 137.3 kg 134.2 kg    Examination:  General exam: Morbid obesity, lethargic  Respiratory system: Bilateral wheezing.  Cardiovascular system: S 1, S 2 RRR Gastrointestinal system: BS present, soft, nt, obese.  Central nervous system: sleepy Extremities: trace edema Skin: chronic hyperpigmentation changes LE.     Data Reviewed: I have personally reviewed following labs and imaging studies  CBC: Recent Labs  Lab 09/29/18 0423  09/30/18 0357 10/01/18 0345 10/02/18 0645 10/03/18 0449 10/04/18 0605  WBC 9.5  --  6.8 7.8 7.8 6.6 5.8  NEUTROABS 8.8*  --  5.3  --   --   --   --   HGB 8.8*   < > 7.8* 8.2* 8.4* 8.3* 8.1*  HCT 32.6*   < > 27.4* 28.7* 30.4* 30.6* 29.7*  MCV 92.1  --  87.8 87.0 89.9 90.5 92.5  PLT 121*  --  101* 94* 87* 75* 65*   < > = values in this interval not displayed.   Basic Metabolic Panel: Recent Labs  Lab 09/30/18 0357 10/01/18 0345 10/02/18 0645 10/03/18 0449 10/04/18 0605  NA 138 140 140 142 145  K 4.7 3.4* 3.6 3.5 3.1*  CL 84* 85* 84* 84* 87*  CO2 46* 47* 47* 48* 48*  GLUCOSE 130* 153* 107* 92 96  BUN 71* 76* 64* 54* 47*  CREATININE 1.42* 1.25* 1.04* 0.90 0.86  CALCIUM 8.7* 8.5* 8.4* 8.6* 8.8*  MG 1.9 1.8 1.8 1.9 1.9  PHOS 2.9 2.7 3.6 3.9 4.4   GFR: Estimated Creatinine Clearance: 89.1 mL/min (by C-G formula based on SCr of 0.86 mg/dL). Liver Function Tests: Recent Labs  Lab 09/29/18 0423  ALBUMIN 3.1*   No results for input(s): LIPASE, AMYLASE in the last 168 hours. No results for input(s): AMMONIA in the last 168 hours. Coagulation Profile: No  results for input(s): INR, PROTIME in the last 168 hours. Cardiac Enzymes: No results for input(s): CKTOTAL, CKMB, CKMBINDEX, TROPONINI in the last 168 hours. BNP (last 3 results) No results for input(s): PROBNP in the last 8760 hours. HbA1C: Recent Labs    10/02/18 1404  HGBA1C 5.3   CBG: Recent Labs  Lab 10/04/18 0012 10/04/18 0442 10/04/18 0835 10/04/18 1215 10/04/18 1939  GLUCAP 92 83 87 83 114*   Lipid Profile: No results for input(s): CHOL, HDL, LDLCALC, TRIG, CHOLHDL, LDLDIRECT in the last 72 hours. Thyroid Function Tests: No results for input(s): TSH, T4TOTAL, FREET4, T3FREE,  THYROIDAB in the last 72 hours. Anemia Panel: No results for input(s): VITAMINB12, FOLATE, FERRITIN, TIBC, IRON, RETICCTPCT in the last 72 hours. Sepsis Labs: No results for input(s): PROCALCITON, LATICACIDVEN in the last 168 hours.  Recent Results (from the past 240 hour(s))  Culture, blood (routine x 2)     Status: None (Preliminary result)   Collection Time: 09/30/18 11:24 AM  Result Value Ref Range Status   Specimen Description BLOOD LEFT ANTECUBITAL  Final   Special Requests   Final    BOTTLES DRAWN AEROBIC ONLY Blood Culture adequate volume   Culture   Final    NO GROWTH 4 DAYS Performed at [facility], [address] [place], [place] [address]    Report Status PENDING  Incomplete  Culture, blood (routine x 2)     Status: None (Preliminary result)   Collection Time: 09/30/18 11:24 AM  Result Value Ref Range Status   Specimen Description BLOOD LEFT HAND  Final   Special Requests   Final    BOTTLES DRAWN AEROBIC ONLY Blood Culture results may not be optimal due to an inadequate volume of blood received in culture bottles   Culture   Final    NO GROWTH 4 DAYS Performed at [facility], [place] [address], [place], [place] [address]    Report Status PENDING  Incomplete  Culture, respiratory     Status: None   Collection Time: 09/30/18  2:01 PM  Result Value Ref Range  Status   Specimen Description TRACHEAL ASPIRATE  Final   Special Requests Normal  Final   Gram Stain   Final    MODERATE WBC PRESENT, PREDOMINANTLY PMN MODERATE GRAM VARIABLE ROD    Culture   Final    ABUNDANT DIPHTHEROIDS(CORYNEBACTERIUM SPECIES) Standardized susceptibility testing for this organism is not available. Performed at [facility], [place] [address], [place], [place] [address]    Report Status 10/02/2018 FINAL  Final  Novel Coronavirus, NAA (hospital order; send-out to ref lab)     Status: None   Collection Time: 10/03/18 12:03 AM  Result Value Ref Range Status   SARS-CoV-2, NAA NOT DETECTED NOT DETECTED Final    Comment: (NOTE) This test was developed and its performance characteristics determined by [employer]. This test has not been FDA cleared or approved. This test has been authorized by FDA under an Emergency Use Authorization (EUA). This test is only authorized for the duration of time the declaration that circumstances exist justifying the authorization of the emergency use of in vitro diagnostic tests for detection of SARS-CoV-2 virus and/or diagnosis of COVID-19 infection under section 564(b)(1) of the Act, 21 U.S.C. 222LNL-8(X)(2), unless the authorization is terminated or revoked sooner. When diagnostic testing is negative, the possibility of a false negative result should be considered in the context of a patient's recent exposures and the presence of clinical signs and symptoms consistent with COVID-19. An individual without symptoms of COVID-19 and who is not shedding SARS-CoV-2 virus would expect to have a negative (not detected) result in this assay. Performed  At: [facility] [address] [place], [place] [id number] Rush Farmer MD XK:[id number]    [place]  Final    Comment: Performed at [facility], [place] [address], [place], [place] [address]         Radiology Studies: Dg Chest  Port 1 View  Result Date: 10/03/2018 CLINICAL DATA:  Dyspnea. History of COPD, congestive heart failure and hypertension. EXAM:  PORTABLE CHEST 1 VIEW COMPARISON:  09/30/2018 and 09/29/2018 radiographs.  CT 09/27/2018. FINDINGS: 1212 hours. Interval extubation and removal of the enteric tube. The right arm PICC is unchanged at the level of the upper SVC. Patchy bilateral airspace opacities appear minimally improved over the last 3 days. There is an asymmetric retrocardiac component which may be due to superimposed atelectasis. There are probable small bilateral pleural effusions. No pneumothorax. The bones appear unchanged. IMPRESSION: 1. Slight improvement in bilateral airspace opacities following extubation. Multifocal pneumonia favored based on prior CT appearance. 2. Small residual bilateral pleural effusions. Electronically Signed   By: Richardean Sale M.D.   On: 10/03/2018 12:43        Scheduled Meds:  acetaminophen  650 mg Oral Q6H   ammonium lactate   Topical BID   chlorhexidine gluconate (MEDLINE KIT)  15 mL Mouth Rinse BID   Chlorhexidine Gluconate Cloth  6 each Topical Daily   diclofenac sodium  2 g Topical QID   famotidine  20 mg Oral BID   furosemide  40 mg Intravenous BID   lidocaine  1 patch Transdermal Q24H   mometasone-formoterol  2 puff Inhalation BID   nortriptyline  25 mg Oral QHS   polyethylene glycol  17 g Oral BID   senna-docusate  2 tablet Oral BID   Continuous Infusions:  sodium chloride 10 mL/hr at 10/03/18 1821     LOS: 15 days    Time spent: 35 minutes.     Elmarie Shiley, MD [facility] Pager [phone]  If 7PM-7AM, please contact night-coverage [email or web address] Password [facility] 10/05/2018, 10:46 AM

## 2018-10-05 NOTE — Progress Notes (Signed)
Daily Progress Note   Patient Name: Renee Noble       Date: 10/05/2018 DOB: November 08, 1955  Age: 63 y.o. MRN#: [id number] Attending Physician: Elmarie Shiley, MD Primary Care Physician: Delorse Limber Admit Date: 09/29/2018  Reason for Consultation/Follow-up: Establishing goals of care, Non pain symptom management, Pain control and Psychosocial/spiritual support  Subjective: Patient somewhat somnolent. Will awaken and engage in conversation. Oriented x3. States Vaughan Basta came to visit yesterday afternoon and that [name] and her husband is not well enough to visit. She expresses she is at peace and no longer wishes to wear BiPAP. Support and comfort provided. She request a sip of ice water. Tolerated well. Is tolerating HFNC without signs of discomfort, however, given request to transition care to full comfort, explained we will begin weaning down HFNC and transition to no greater than 2L/[place] for comfort. She verbalized understanding and requesting not to suffer or feel as if she can't breath. Support given and she is aware nursing will closely assess and manage symptoms. Patient smiling and verbalizing her appreciation of the care she has been receiving and how much it meant for her to see and video chat with her son.   Called and updated [name] on patient's status and shifting of care to full comfort. She verbalized appreciation and states she is glad that patient is in the hospital for EOL and will be kept comfortable. She expressed she has watched patient suffer for some time and although she will miss her she knows this decision is for the best. Support given. She expressed she would speak with Jenny Reichmann later this afternoon but was able to speak with him earlier this morning and he was at peace  with his mother's decision and informed her to call once she passed away.   The above conversation was completed via telephone due to the visitor restrictions during the COVID-19 pandemic. Thorough chart review and discussion with necessary members of the care team was completed as part of assessment.    Length of Stay: 15  Current Medications: Scheduled Meds:  . ammonium lactate   Topical BID  . lidocaine  1 patch Transdermal Q24H  . nortriptyline  25 mg Oral QHS    Continuous Infusions: . sodium chloride 10 mL/hr at 10/03/18 1821    PRN Meds: sodium chloride,  acetaminophen, albuterol, antiseptic oral rinse, bisacodyl, diclofenac sodium, glycopyrrolate, ipratropium-albuterol, lactulose, LORazepam **OR** LORazepam, mometasone-formoterol, morphine injection, ondansetron (ZOFRAN) IV, polyethylene glycol, polyvinyl alcohol, Resource ThickenUp Clear, senna-docusate  Physical Exam   General: morbidly obese, chronically-ill appearing, somewhat somnolent, arousalge, A&O x3.  Cardiovascular: RRR, bilateral lower extremity edema/weeping Pulmonary: HFNC, diminished bases Skin:Scattered bruising to upper extremities, lower extremity discoloration        Vital Signs: BP (!) 136/53 (BP Location: Left Arm)   Pulse 95   Temp 98 F (36.7 C) (Oral)   Resp 20   Ht 5\' 4"  (1.626 m)   Wt 134.2 kg   SpO2 95%   BMI 50.78 kg/m  SpO2: SpO2: 95 % O2 Device: O2 Device: High Flow Nasal Cannula O2 Flow Rate: O2 Flow Rate (L/min): 4 L/min  Intake/output summary:   Intake/Output Summary (Last 24 hours) at 10/05/2018 1109 Last data filed at 10/05/2018 2440 Gross per 24 hour  Intake 68 ml  Output 1900 ml  Net -1832 ml   LBM: Last BM Date: 10/03/18 Baseline Weight: Weight: 136 kg Most recent weight: Weight: 134.2 kg       Palliative Assessment/Data:FULL COMFORT    Flowsheet Rows     Most Recent Value  Intake Tab  Referral Department  Hospitalist  Unit at Time of Referral  Intermediate Care  Unit  Palliative Care Primary Diagnosis  Pulmonary  Date Notified  10/02/18  Palliative Care Type  Return patient Palliative Care  Reason for referral  Clarify Goals of Care  Date of Admission  10/03/2018  # of days IP prior to Palliative referral  12  Clinical Assessment  Psychosocial & Spiritual Assessment  Palliative Care Outcomes      Patient Active Problem List   Diagnosis Date Noted  . Hyperkalemia   . Respiratory failure with hypercapnia ([place]) 10/14/2018  . Palliative care encounter   . Endotracheal tube present   . Pressure ulcer 08/30/2018  . Acute exacerbation of chronic obstructive pulmonary disease (COPD) ([place]) 08/28/2018  . Atrial flutter with rapid ventricular response ([place]) 08/28/2018  . AKI (acute kidney injury) ([place]) 08/28/2018  . Wound of thigh 08/15/2018  . Pressure injury of skin 07/17/2018  . Hypoglycemia due to insulin 07/14/2018  . Acute on chronic respiratory failure with hypercapnia ([place]) 06/09/2018  . HLD (hyperlipidemia) 06/09/2018  . Hypothyroidism 06/09/2018  . Depression 06/09/2018  . Fibromyalgia   . Lung nodule 05/08/2018  . Acute on chronic congestive heart failure ([place])   . Morbid obesity with BMI of 50.0-59.9, adult ([place]) 04/10/2018  . Chronic pain 04/07/2018  . Acute on chronic diastolic CHF (congestive heart failure) ([place]) 04/07/2018  . PNA (pneumonia) 03/21/2018  . Chronic obstructive pulmonary disease ([place]) 02/25/2018  . Chronic pain syndrome   . Normocytic anemia 02/24/2018  . Acute on chronic respiratory failure with hypoxia and hypercapnia ([place]) 02/24/2018  . OSA (obstructive sleep apnea) 08/16/2017  . Morbid obesity due to excess calories ([place]) 04/29/2017  . Cor pulmonale (chronic) ([place]) 04/29/2017  . Chronic respiratory failure with hypoxia and hypercapnia ([place]) 04/28/2017  . CKD (chronic kidney disease), stage III ([place]) 04/14/2016  . Hyperlipidemia 01/16/2016  . Essential hypertension 01/16/2016  . Hypoxia   .  Thrombocytopenia ([place]) 07/12/2015  . Thyroid activity decreased 06/23/2015  . Encounter for orogastric (OG) tube placement 09/02/2014  . Hernia of abdominal cavity 07/30/2014  . Abnormal CXR 02/15/2014  . COPD GOLDIII with min reversibility  12/16/2013  . History of hepatitis C 12/16/2013  . Breast  cancer screening 12/16/2013  . Type II diabetes mellitus with renal manifestations ([place]) 12/16/2013  . Screening for osteoporosis 12/16/2013    Palliative Care Assessment & Plan   Patient Profile: Palliative Care consult requested for this 63 y.o. female with multiple medical problems including COPD (3L at home), diastolic CHF, chronic hypoxic/hypercarbic respiratory failure, OSA, paroxysmal atrial fibrillation (not on anticoagulants due to chronic thrombocytopenia), type 2 diabetes, anemia, obesity, apical compression fracture, chronic kidney disease stage III, fibromyalgia, hepatitis C, hypertension, neuropathy, osteoarthritis, venous stasis, and renal cancer.  Patient was recently hospitalized for acute on chronic respiratory failure requiring intubation x2.  Patient admitted with similar presentation now including hyperkalemia.  Continues to be dependent on BiPAP for respiratory support.  Recommendations/Plan:  DNR/DNI  FULL COMFORT  Family updated. No plans for anyone to visit today per [name].   Discussed with RN regarding weaning down off of HFNC and transitioning to 2L/ or less for comfort. Manage air hunger/anxiety with PRNs.   All orders d/c that are not comfort focused   PMT will continue to support and follow  Goals of Care and Additional Recommendations:  Limitations on Scope of Treatment: Full Comfort Care and Minimize Medications  Code Status:    Code Status Orders  (From admission, onward)         Start     Ordered   10/04/18 1504  Do not attempt resuscitation (DNR)  Continuous    Question Answer Comment  In the event of cardiac or respiratory ARREST Do not  call a "code blue"   In the event of cardiac or respiratory ARREST Do not perform Intubation, CPR, defibrillation or ACLS   In the event of cardiac or respiratory ARREST Use medication by any route, position, wound care, and other measures to relive pain and suffering. May use oxygen, suction and manual treatment of airway obstruction as needed for comfort.      10/04/18 1507        Code Status History    Date Active Date Inactive Code Status Order ID Comments User Context   10/04/2018 1459 10/04/2018 1507 DNR [id number]  Jimmy Footman, NP Inpatient   10/03/2018 1509 10/04/2018 1459 Partial Code [id number]  Jimmy Footman, NP Inpatient   09/29/2018 2358 10/03/2018 1509 Full Code [id number]  Arnell Asal, NP ED   08/28/2018 2102 09/16/2018 1831 Full Code [id number]  Howerter, Ethelda Chick, DO ED   07/15/2018 0028 07/17/2018 1616 Full Code [id number]  Phillips Grout, MD ED   06/09/2018 0034 06/15/2018 1836 Full Code [id number]  Ivor Costa, MD ED   04/07/2018 2041 04/11/2018 1742 Full Code [id number]  Truett Mainland, DO Inpatient   02/24/2018 2355 03/02/2018 2023 Full Code [id number]  [name], MD Inpatient   07/11/2015 1846 07/17/2015 1511 Full Code [id number]  Debbe Odea, MD ED    Advance Directive Documentation     Most Recent Value  Type of Advance Directive  Healthcare Power of Attorney  Pre-existing out of facility DNR order (yellow form or pink MOST form)  -  "MOST" Form in Place?  -     Prognosis:   Hours - Days in the setting of full comfort care.   Discharge Planning:  Anticipated Hospital Death versus residential hospice if symptoms are managed and stable.   Care plan was discussed with patient, her sister, [name], bedside RN, and Dr. Tyrell Antonio.   Thank you for allowing the Palliative Medicine Team to assist in the care of this patient.  Total Time: 40 min.   Greater than 50%  of this time was spent counseling and coordinating care related to the above  assessment and plan.  Alda Lea, AGPCNP-BC Palliative Medicine Team  Phone: [phone] Pager: [phone] Amion: Bjorn Pippin    Please contact Palliative Medicine Team phone at [phone] for questions and concerns.

## 2018-10-05 NOTE — Progress Notes (Signed)
Daily Nursing Note  Received report. Introduced self to patient who was somnolent at the time of assessment. Spoke to patients cousin [name] who stated she would not be able to see the patient as she'd like to remember her how she was prior to chronic illness. Spoke to patient friend, Tye [place] who said she would not be coming by. Patient spoke with her son via facetime yesterday, per Palliative he will also not be coming by. Communicated this to the palliative team who have transitioned patient to full comfort care. Transitioned from 5LPM HFNC to 3 LPM basic [place]. Given morphine 59m IVP x1 with relief of dypnea. All patient needs met throughout the course of the day.

## 2018-10-05 NOTE — Progress Notes (Signed)
Patient has repeatedly refused to wear Bipap at this time.  MD notified.  Patient is wearing the nasal cannula.

## 2018-10-06 DIAGNOSIS — Z515 Encounter for palliative care: Secondary | ICD-10-CM

## 2018-10-06 NOTE — Progress Notes (Addendum)
PROGRESS NOTE   Renee Noble  EPP:[id number]    DOB: [birth year]    DOA: 10/06/2018  PCP: [name], PA-C   I have briefly reviewed patients previous medical records in [facility].  Brief Narrative:  63 year old female with extensive PMH but not limited to chronic hypoxic respiratory failure on 3 L/min home oxygen, COPD, OHS, OSA, chronic diastolic CHF, PAF not on AC given chronic thrombocytopenia/recent anemia, DM 2, obesity, recent hospitalization 4/13-5/2 for acute on chronic hypoxic respiratory failure requiring intubation twice and acute on chronic heart failure, presented back from SNF/[facility] for acute on chronic hypoxic/hypercarbic respiratory failure, acute on chronic CHF, altered mental status and hyperkalemia.  Admitted to ICU by CCM, improved and transferred to [facility] on 5/9, intubated 5/15 for AMS and lethargy, extubated 5/17 and transferred again to [facility] on 5/18.  Evaluation with CT abdomen showed pancreatic and renal mass.  Renal biopsy confirmed renal cell carcinoma.  Oncology felt that she was not a good candidate for aggressive intervention.  She could not tolerate further work-up of her pancreatic mass.  Since transfer out of ICU to [facility], she continued to decline, developed recurrent hypercapnic respiratory failure and was BiPAP dependent.  CCM reassessed and recommended palliative care consultation who discussed with family/friends and transitioned her to full comfort care on 10-19-2018.  Anticipated hospital death versus residential hospice if symptoms managed and stable.  Assessment & Plan:   Active Problems:   Acute on chronic respiratory failure with hypoxia and hypercapnia (HCC)   Respiratory failure with hypercapnia (HCC)   Hyperkalemia   Assessment and plan:  1. Acute on chronic hypoxic and hypercapnic respiratory failure, severe COPD with exacerbation, OSA/OHS on nightly CPAP: History as noted above.  Required multiple intubations during recent  hospitalization and last during this hospitalization from 5/15-5/17.  Became BiPAP dependent.  PMT consulted for goals of care and transitioned her to full comfort care on 2022/10/19.  Seen today as palliative care encounter. 2. Acute on chronic diastolic CHF: TTE 5/7: LVEF 50-55%.  TTE 5/15: LVEF 60-65%.  Diuresed earlier with IV Lasix.  Now comfort care. 3. Permanent A. fib: Not on telemetry at this time. 4. Renal cell carcinoma/pancreatic mass/right upper lobe pulmonary nodule: Oncology evaluated and likely not a candidate for aggressive intervention.  Oncology recommended MRI versus endoscopic ultrasound biopsy for the pancreatic mass but patient reportedly did not tolerate MRI.  Renal inferior pole mass concerning for malignancy and suggestion of extension of tumor thrombus into the right renal vein and possible IVC.  Now comfort care. 5. Anemia and thrombocytopenia: Noted.  No further lab draws 6. Type II IDDM with renal complications: S0Y 5.3. 7. Hypothyroid 8. History of depression 9. Acute toxic and metabolic encephalopathy: Multifactorial.  Currently unresponsive. 10. Morbid obesity/Body mass index is 50.78 kg/m. 11. Multiple pressure injuries: As noted in Dr. Paulina Fusi progress note from 19-Oct-2022.  Not examined today. 12. Adult failure to thrive.    DVT prophylaxis: None.  Comfort care. Code Status: DNR Family Communication: None at bedside Disposition:  Anticipated hospital death versus residential hospice if symptoms managed and stable.   Consultants:   PCCM  Nephrology on 5/7  Oncology on 5/11  Gastroenterology on 5/13  IR on 5/14  Palliative care on 5/18   Procedures:  Intubation/extubation. CT-guided renal biopsy 5/14  Antimicrobials:  Completed or discontinued   Subjective: Unresponsive.  No history available from patient.  As per RN, no acute issues noted and patient is comfortable.  ROS: Unable.  Objective:  Vitals:   10/04/18 2032 10/05/18 0809 10/05/18  1734 10/05/18 2158  BP:      Pulse: 95 95 98   Resp: 20 20 16    Temp:      TempSrc:      SpO2: 98% 95%  (!) 75%  Weight:      Height:        Examination:  General exam: Middle-aged female, appears older than stated age, moderately built and morbidly obese lying comfortably propped up in bed without distress.  On nasal cannula oxygen at this time. Respiratory system: Reduced breath sounds bilaterally.  No increased work of breathing. Cardiovascular system: S1 & S2 heard, RRR. No JVD, murmurs, rubs, gallops or clicks.  Chronic appearing bilateral leg edema.  Not on telemetry. Gastrointestinal system: Abdomen is nondistended, soft and nontender. No organomegaly or masses felt. Normal bowel sounds heard. Central nervous system: Mental status as noted above. Extremities: Bruising of upper extremities.  Chronic edema changes of bilateral lower extremities with lichenification of skin.  Right upper arm PICC line. Skin: As per PN from 5/21, not examined today.     Data Reviewed: I have personally reviewed following labs and imaging studies  CBC: Recent Labs  Lab 09/30/18 0357 10/01/18 0345 10/02/18 0645 10/03/18 0449 10/04/18 0605  WBC 6.8 7.8 7.8 6.6 5.8  NEUTROABS 5.3  --   --   --   --   HGB 7.8* 8.2* 8.4* 8.3* 8.1*  HCT 27.4* 28.7* 30.4* 30.6* 29.7*  MCV 87.8 87.0 89.9 90.5 92.5  PLT 101* 94* 87* 75* 65*   Basic Metabolic Panel: Recent Labs  Lab 09/30/18 0357 10/01/18 0345 10/02/18 0645 10/03/18 0449 10/04/18 0605  NA 138 140 140 142 145  K 4.7 3.4* 3.6 3.5 3.1*  CL 84* 85* 84* 84* 87*  CO2 46* 47* 47* 48* 48*  GLUCOSE 130* 153* 107* 92 96  BUN 71* 76* 64* 54* 47*  CREATININE 1.42* 1.25* 1.04* 0.90 0.86  CALCIUM 8.7* 8.5* 8.4* 8.6* 8.8*  MG 1.9 1.8 1.8 1.9 1.9  PHOS 2.9 2.7 3.6 3.9 4.4   CBG: Recent Labs  Lab 10/04/18 0012 10/04/18 0442 10/04/18 0835 10/04/18 1215 10/04/18 1939  GLUCAP 92 83 87 83 114*    Recent Results (from the past 240 hour(s))   Culture, blood (routine x 2)     Status: None   Collection Time: 09/30/18 11:24 AM  Result Value Ref Range Status   Specimen Description BLOOD LEFT ANTECUBITAL  Final   Special Requests   Final    BOTTLES DRAWN AEROBIC ONLY Blood Culture adequate volume   Culture   Final    NO GROWTH 5 DAYS Performed at [facility], [address] [place], [place] [address]    Report Status 10/05/2018 FINAL  Final  Culture, blood (routine x 2)     Status: None   Collection Time: 09/30/18 11:24 AM  Result Value Ref Range Status   Specimen Description BLOOD LEFT HAND  Final   Special Requests   Final    BOTTLES DRAWN AEROBIC ONLY Blood Culture results may not be optimal due to an inadequate volume of blood received in culture bottles   Culture   Final    NO GROWTH 5 DAYS Performed at [facility], [place] [address] [place], [place] [address]    Report Status 10/05/2018 FINAL  Final  Culture, respiratory     Status: None   Collection Time:  09/30/18  2:01 PM  Result Value Ref Range Status   Specimen Description TRACHEAL ASPIRATE  Final   Special Requests Normal  Final   Gram Stain   Final    MODERATE WBC PRESENT, PREDOMINANTLY PMN MODERATE GRAM VARIABLE ROD    Culture   Final    ABUNDANT DIPHTHEROIDS(CORYNEBACTERIUM SPECIES) Standardized susceptibility testing for this organism is not available. Performed at [facility], [place] [address], [place], [place] [address]    Report Status 10/02/2018 FINAL  Final  Novel Coronavirus, NAA (hospital order; send-out to ref lab)     Status: None   Collection Time: 10/03/18 12:03 AM  Result Value Ref Range Status   SARS-CoV-2, NAA NOT DETECTED NOT DETECTED Final    Comment: (NOTE) This test was developed and its performance characteristics determined by [employer]. This test has not been FDA cleared or approved. This test has been authorized by FDA under an Emergency Use Authorization (EUA). This test is only authorized for the  duration of time the declaration that circumstances exist justifying the authorization of the emergency use of in vitro diagnostic tests for detection of SARS-CoV-2 virus and/or diagnosis of COVID-19 infection under section 564(b)(1) of the Act, 21 U.S.C. 262MBT-5(H)(7), unless the authorization is terminated or revoked sooner. When diagnostic testing is negative, the possibility of a false negative result should be considered in the context of a patient's recent exposures and the presence of clinical signs and symptoms consistent with COVID-19. An individual without symptoms of COVID-19 and who is not shedding SARS-CoV-2 virus would expect to have a negative (not detected) result in this assay. Performed  At: [facility] [address] [place], [place] [id number] [place] Farmer MD IW:[id number]    [place]  Final    Comment: Performed at [facility], [place] [address], [place], [place] [address]         Radiology Studies: No results found.      Scheduled Meds: Continuous Infusions: . sodium chloride 10 mL/hr at 10/05/18 1549  . morphine 3 mg/hr (10/05/18 2129)     LOS: 16 days     Vernell Leep, MD, FACP, [facility]. [facility]  To contact the attending provider between 7A-7P or the covering provider during after hours 7P-7A, please log into the web site [email or web address] and access using universal [place] password for that web site. If you do not have the password, please call the hospital operator.  10/06/2018, 11:43 AM

## 2018-10-06 NOTE — Plan of Care (Signed)
  Problem: Education: Goal: Knowledge of General Education information will improve Description Including pain rating scale, medication(s)/side effects and non-pharmacologic comfort measures Outcome: Not Progressing   Problem: Health Behavior/Discharge Planning: Goal: Ability to manage health-related needs will improve Outcome: Not Progressing   Problem: Nutrition: Goal: Adequate nutrition will be maintained Outcome: Progressing   Problem: Coping: Goal: Level of anxiety will decrease Outcome: Progressing

## 2018-10-06 NOTE — Progress Notes (Signed)
PALLIATIVE NOTE:  Patient unresponsive, full comfort measures. She was started on morphine drip during the night for better symptom management and comfort. She appears comfortable in no obvious signs of distress. No family at the bedside. Some family was able to visit earlier this week.   I attempted to contact her son with updates but he was unavailable. [name] her sister has been updated and aware of her current condition. She verbalized appreciation of the care the medical team has been providing and her being happy knowing Daesha is not suffering during this time.   Assessment: morbidly obese, chronically-ill, actively dying, unresponsive, IRRR, shallow breathing, diminished bases, 2L/[place]  Plan: -Full Comfort Measures -PRNs for symptom management -[name] to be contacted once patient passes away -PMT will continue to support and follow  Total Time: 25 min  Greater than 50%  of this time was spent counseling and coordinating care related to the above assessment and plan/  Alda Lea, AGPCNP-BC Palliative Medicine Team  Phone: [phone] Pager: [phone] Amion: Bjorn Pippin

## 2018-10-16 NOTE — Final Progress Note (Signed)
Patient passed away at approximately 0230.  Death was confirmed by Dorothyann Peng, RN and Jacqulyn Ducking, RN.  Death certificate was signed by Jeannette Corpus.  This nurse notified next of kin, Venancio Poisson.  She said she will call back with funeral home information at a later time.  [facility] was called.  Guerry Bruin was the [employer] representative.  The patient's body was bathed and transported to the morgue.

## 2018-10-16 NOTE — Death Summary Note (Signed)
DEATH SUMMARY   Patient Details  Name: Renee Noble MRN: [id number] DOB: Nov 12, 1955  Admission/Discharge Information   Admit Date:  Sep 26, 2018  Date of Death: Date of Death: Oct 13, 2018  Time of Death: Time of Death: 0225  Length of Stay: 08/07/22  Referring Physician: [name], PA-C   Reason(s) for Hospitalization    Diagnoses  Preliminary cause of death: Respiratory arrest Secondary Diagnoses (including complications and co-morbidities):  Active Problems:   Acute on chronic respiratory failure with hypoxia and hypercapnia (HCC)   Respiratory failure with hypercapnia (HCC)   Hyperkalemia   Brief Hospital Course (including significant findings, care, treatment, and services provided and events leading to death)  Renee Noble was a 63 year old female with extensive PMH but not limited to chronic hypoxic respiratory failure on 3 L/min home oxygen, COPD, OHS, OSA, chronic diastolic CHF, PAF not on AC given chronic thrombocytopenia/recent anemia, DM 2, obesity, recent hospitalization 4/13-5/2 for acute on chronic hypoxic respiratory failure requiring intubation twice and acute on chronic heart failure, presented back from SNF/[facility] for acute on chronic hypoxic/hypercarbic respiratory failure, acute on chronic CHF, altered mental status and hyperkalemia.  Admitted to ICU by CCM, improved and transferred to [facility] on 5/9, intubated 5/15 for AMS and lethargy, extubated 5/17 and transferred again to [facility] on 5/18.  Evaluation with CT abdomen showed pancreatic and renal mass.  Renal biopsy confirmed renal cell carcinoma.  Oncology felt that she was not a good candidate for aggressive intervention.  She could not tolerate further work-up of her pancreatic mass.  Since transfer out of ICU to [facility], she continued to decline, developed recurrent hypercapnic respiratory failure and was BiPAP dependent.  CCM reassessed and recommended palliative care consultation who discussed with  family/friends and transitioned her to full comfort care on 10/05/2018.    She subsequently demised on 13-Oct-2018 at 2:25 AM.  Following is the detailed hospital course during her last hospitalization.  Assessment and plan:  1. Acute on chronic hypoxic and hypercapnic respiratory failure, severe COPD with exacerbation, OSA/OHS on nightly CPAP: History as noted above.  Required multiple intubations during recent hospitalization and last during this hospitalization from 5/15-5/17.  Became BiPAP dependent.  PMT consulted for goals of care and transitioned her to full comfort care on 5/21.  2. Acute on chronic diastolic CHF: TTE 5/7: LVEF 50-55%.  TTE 5/15: LVEF 60-65%.  Diuresed earlier with IV Lasix then transitioned to full comfort care. 3. Permanent A. fib:  4. Renal cell carcinoma/pancreatic mass/right upper lobe pulmonary nodule: Oncology evaluated and likely not a candidate for aggressive intervention.  Oncology recommended MRI versus endoscopic ultrasound biopsy for the pancreatic mass but patient reportedly did not tolerate MRI.  Renal inferior pole mass concerning for malignancy and suggestion of extension of tumor thrombus into the right renal vein and possible IVC.    Transitioned to full comfort care. 5. Anemia and thrombocytopenia: Noted.   6. Type II IDDM with renal complications: Z6O 5.3. 7. Hypothyroid 8. History of depression 9. Acute toxic and metabolic encephalopathy: Multifactorial.  Eventually was comatose/unresponsive but comfortable. 10. Morbid obesity/Body mass index is 50.78 kg/m. 11. Multiple pressure injuries: As noted in note from 5/21.  12. Adult failure to thrive.   Consultants:   PCCM  Nephrology on 5/7  Oncology on 5/11  Gastroenterology on 5/13  IR on 5/14  Palliative care on 5/18   Procedures:  Intubation/extubation. CT-guided renal biopsy 5/14   Pertinent Labs and Studies  Significant Diagnostic Studies  Ct Abdomen Pelvis W Wo  Contrast  Result Date: 09/27/2018 CLINICAL DATA:  Right renal and pancreatic lesions on recent noncontrast CT. Cirrhosis. Bilateral pleural effusions. EXAM: CT CHEST WITHOUT CONTRAST CT ABDOMEN AND PELVIS WITH AND WITHOUT CONTRAST TECHNIQUE: Multidetector CT imaging of the chest was performed prior to intravenous contrast administration. Multidetector CT imaging of the abdomen and pelvis was performed following the standard protocol before and during bolus administration of intravenous contrast. CONTRAST:  120mL OMNIPAQUE IOHEXOL 300 MG/ML  SOLN COMPARISON:  Noncontrast AP CT on 09/26/2018, and chest CT on 05/03/2018 FINDINGS: CT CHEST FINDINGS Cardiovascular: No acute findings. Aortic and coronary artery atherosclerosis. Mediastinum/Nodes: No masses or pathologically enlarged lymph nodes identified on this unenhanced exam. Lungs/Pleura: Moderate bilateral pleural effusions. Compressive atelectasis of both lung bases. Airspace disease with central air bronchograms seen in the left upper lobe, suspicious for pneumonia. Central tracheobronchial airways remain patent. Musculoskeletal:  No suspicious bone lesions. CT ABDOMEN AND PELVIS FINDINGS Hepatobiliary: Hepatic cirrhosis again demonstrated. No liver masses identified. Prior cholecystectomy. No evidence of biliary obstruction. Pancreas: Solid mass is seen involving the pancreatic head which measures 4.4 x 4.0 cm, which shows arterial phase hyperenhancement. 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 No evidence of pancreatic ductal dilatation. Spleen: Moderate splenomegaly, consistent with portal venous hypertension. Adrenals/Urinary Tract: Absence of left kidney again seen. A large solid mass showing arterial phase hyperenhancement is seen involving the mid and lower pole of the right kidney. This measures 10.7 x 9.4 cm, consistent with renal cell carcinoma. This appears to be contained within Gerota's fascia. No  evidence of hydronephrosis. No evidence of renal vein or IVC tumor thrombus. Stomach/Bowel: No evidence of obstruction, inflammatory process or abnormal fluid collections. Surgical mesh again seen in anterior abdominal wall. A small left abdominal ventral hernia is again seen containing a loop of distal transverse colon, and is stable. Vascular/Lymphatic: No pathologically enlarged lymph nodes. No abdominal aortic aneurysm. Aortic atherosclerosis. Reproductive: Prior hysterectomy noted. Adnexal regions are unremarkable in appearance. Other: Mild ascites and diffuse mesenteric and body wall edema show no significant change. Musculoskeletal:  No suspicious bone lesions identified. IMPRESSION: 1. 10.7 cm solid mass involving the mid and lower pole of right kidney, consistent with renal cell carcinoma. No evidence of renal vein or IVC tumor thrombus. 2. 4.4 cm hypervascular mass in the pancreatic head. This favors hypervascular metastasis or pancreatic neuroendocrine tumor over pancreatic adenocarcinoma. 3. Moderate bilateral pleural effusions and compressive atelectasis. Airspace disease with central air bronchograms in left upper lobe, suspicious for pneumonia. 4. Hepatic cirrhosis and findings of portal venous hypertension. No radiographic evidence of hepatic neoplasm. 5. Mild ascites, and diffuse mesenteric and body wall edema. 6. Stable small left abdominal ventral hernia containing loop of distal transverse colon. Electronically Signed   By: Earle Gell M.D.   On: 09/27/2018 10:03   Ct Abdomen Pelvis Wo Contrast  Result Date: 09/26/2018 CLINICAL DATA:  63 year old female with pancytopenia. Splenomegaly and cirrhosis. EXAM: CT ABDOMEN AND PELVIS WITHOUT CONTRAST TECHNIQUE: Multidetector CT imaging of the abdomen and pelvis was performed following the standard protocol without IV contrast. COMPARISON:  CT of the abdomen pelvis dated 08/23/2014 and ultrasound of the abdomen dated 09/04/2018 FINDINGS: Evaluation  of this exam is limited in the absence of intravenous contrast as well as due to anasarca. Lower chest: Partially visualized moderate-sized bilateral pleural effusions with associated compressive atelectasis of the lower lobes. Superimposed pneumonia is not excluded. There is mild cardiomegaly. Coronary vascular calcifications  noted. No intra-abdominal free air. There is a small ascites. Hepatobiliary: Morphologic changes of cirrhosis. Cholecystectomy. Pancreas: There is a 3.5 x 4.7 cm mass in the region of the head of the pancreas. There is atrophy of the body and tail of the pancreas. This mass may represent a primary pancreatic malignancy or metastatic disease/adenopathy. Spleen: Splenomegaly measuring up to 21 cm in greatest length. Adrenals/Urinary Tract: There is a solitary right kidney. There is a lobulated mass involving the inferior pole of the right kidney measuring approximately 8 x 11 cm in greatest axial dimensions and 14 cm in craniocaudal length most consistent with a neoplasm. There is no hydronephrosis or nephrolithiasis. The urinary bladder is decompressed around a Foley catheter and contains a small amount of air. Stomach/Bowel: There is a large amount of stool within the colon. There is a left spigelian hernia containing a segment of the colon. No associated obstruction. The appendix is not identified with certainty. Vascular/Lymphatic: There is moderate aortoiliac atherosclerotic disease. There is dilatation of the right renal vein with apparent high attenuating content likely representing tumor thrombus. Further evaluation with MRI or CT with IV contrast recommended. No portal venous gas. There is no retroperitoneal adenopathy. Reproductive: Hysterectomy. There is a 2.7 x 2.1 cm ill-defined high attenuating structure in the region of the right adnexa (series 3, image 61). Other: Diffuse subcutaneous edema and anasarca. Musculoskeletal: Degenerative changes of the spine. No acute osseous  pathology. IMPRESSION: 1. Large right renal inferior pole mass most consistent with malignancy. There is suggestion of extension of tumor thrombus into the right renal vein and possibly IVC. Further evaluation with MRI or CT with IV contrast recommended. 2. Rounded mass in the region of the head of the pancreas may represent a primary pancreatic neoplasm versus metastatic disease/adenopathy. 3. Cirrhosis with evidence of portal hypertension, splenomegaly, ascites and anasarca. 4. Partially visualized moderate-sized bilateral pleural effusions with associated compressive atelectasis of the lower lobes. Pneumonia is not excluded. Electronically Signed   By: Anner Crete M.D.   On: 09/26/2018 01:53   Dg Chest 2 View  Result Date: 09/25/2018 CLINICAL DATA:  Heart failure EXAM: CHEST - 2 VIEW COMPARISON:  10/09/2018 FINDINGS: Cardiac enlargement. Pulmonary vascular congestion. Progression of diffuse bilateral airspace disease with particular prominence in the left upper lobe. Small bilateral effusions. Atherosclerotic aortic arch. IMPRESSION: Progression of congestive heart failure with edema and small effusions. Electronically Signed   By: Franchot Gallo M.D.   On: 09/25/2018 07:33   Ct Chest Wo Contrast  Result Date: 09/27/2018 CLINICAL DATA:  Right renal and pancreatic lesions on recent noncontrast CT. Cirrhosis. Bilateral pleural effusions. EXAM: CT CHEST WITHOUT CONTRAST CT ABDOMEN AND PELVIS WITH AND WITHOUT CONTRAST TECHNIQUE: Multidetector CT imaging of the chest was performed prior to intravenous contrast administration. Multidetector CT imaging of the abdomen and pelvis was performed following the standard protocol before and during bolus administration of intravenous contrast. CONTRAST:  13mL OMNIPAQUE IOHEXOL 300 MG/ML  SOLN COMPARISON:  Noncontrast AP CT on 09/26/2018, and chest CT on 05/03/2018 FINDINGS: CT CHEST FINDINGS Cardiovascular: No acute findings. Aortic and coronary artery  atherosclerosis. Mediastinum/Nodes: No masses or pathologically enlarged lymph nodes identified on this unenhanced exam. Lungs/Pleura: Moderate bilateral pleural effusions. Compressive atelectasis of both lung bases. Airspace disease with central air bronchograms seen in the left upper lobe, suspicious for pneumonia. Central tracheobronchial airways remain patent. Musculoskeletal:  No suspicious bone lesions. CT ABDOMEN AND PELVIS FINDINGS Hepatobiliary: Hepatic cirrhosis again demonstrated. No liver masses identified. Prior cholecystectomy.  No evidence of biliary obstruction. Pancreas: Solid mass is seen involving the pancreatic head which measures 4.4 x 4.0 cm, which shows arterial phase hyperenhancement. 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 No evidence of pancreatic ductal dilatation. Spleen: Moderate splenomegaly, consistent with portal venous hypertension. Adrenals/Urinary Tract: Absence of left kidney again seen. A large solid mass showing arterial phase hyperenhancement is seen involving the mid and lower pole of the right kidney. This measures 10.7 x 9.4 cm, consistent with renal cell carcinoma. This appears to be contained within Gerota's fascia. No evidence of hydronephrosis. No evidence of renal vein or IVC tumor thrombus. Stomach/Bowel: No evidence of obstruction, inflammatory process or abnormal fluid collections. Surgical mesh again seen in anterior abdominal wall. A small left abdominal ventral hernia is again seen containing a loop of distal transverse colon, and is stable. Vascular/Lymphatic: No pathologically enlarged lymph nodes. No abdominal aortic aneurysm. Aortic atherosclerosis. Reproductive: Prior hysterectomy noted. Adnexal regions are unremarkable in appearance. Other: Mild ascites and diffuse mesenteric and body wall edema show no significant change. Musculoskeletal:  No suspicious bone lesions identified. IMPRESSION: 1. 10.7 cm solid mass  involving the mid and lower pole of right kidney, consistent with renal cell carcinoma. No evidence of renal vein or IVC tumor thrombus. 2. 4.4 cm hypervascular mass in the pancreatic head. This favors hypervascular metastasis or pancreatic neuroendocrine tumor over pancreatic adenocarcinoma. 3. Moderate bilateral pleural effusions and compressive atelectasis. Airspace disease with central air bronchograms in left upper lobe, suspicious for pneumonia. 4. Hepatic cirrhosis and findings of portal venous hypertension. No radiographic evidence of hepatic neoplasm. 5. Mild ascites, and diffuse mesenteric and body wall edema. 6. Stable small left abdominal ventral hernia containing loop of distal transverse colon. Electronically Signed   By: Earle Gell M.D.   On: 09/27/2018 10:03   Ct Biopsy  Result Date: 09/28/2018 INDICATION: Remote history of left-sided RCC, post nephrectomy, now with large right-sided renal mass worry for contralateral RCC. EXAM: CT BIOPSY COMPARISON:  CT the chest, abdomen pelvis-09/27/2018 MEDICATIONS: Zofran 4 mg IV ANESTHESIA/SEDATION: Versed 0.5 mg IV Sedation time: 22 minutes; The patient was continuously monitored during the procedure by the interventional radiology nurse under my direct supervision. CONTRAST:  None. COMPLICATIONS: None immediate. PROCEDURE: Informed consent was obtained from the patient following an explanation of the procedure, risks, benefits and alternatives. A time out was performed prior to the initiation of the procedure. The patient was positioned supine on the CT table and a limited CT was performed for procedural planning demonstrating unchanged size and appearance of the infiltrative mass involving the caudal aspect the right kidney with dominant component measuring 9.1 x 8.4 cm (image 51, series 2). The procedure was planned. The operative site was prepped and draped in the usual sterile fashion. Appropriate trajectory was confirmed with a 22 gauge spinal  needle after the adjacent tissues were anesthetized with 1% Lidocaine with epinephrine. Under intermittent CT guidance, a 17 gauge coaxial needle was advanced into the peripheral aspect of the mass. Appropriate positioning was confirmed and 5 core needle biopsy samples were obtained with an 18 gauge core needle biopsy device. The co-axial needle was removed following the administration of a Gel-Foam slurry and superficial hemostasis was achieved with manual compression. A limited postprocedural CT was negative for hemorrhage or additional complication. A dressing was placed. The patient tolerated the procedure well without immediate postprocedural complication. IMPRESSION: Technically successful CT guided core needle biopsy of infiltrative mass involving the right kidney. Electronically  Signed   By: Sandi Mariscal M.D.   On: 09/28/2018 10:45   Dg Chest Port 1 View  Result Date: 10/03/2018 CLINICAL DATA:  Dyspnea. History of COPD, congestive heart failure and hypertension. EXAM: PORTABLE CHEST 1 VIEW COMPARISON:  09/30/2018 and 09/29/2018 radiographs.  CT 09/27/2018. FINDINGS: 1212 hours. Interval extubation and removal of the enteric tube. The right arm PICC is unchanged at the level of the upper SVC. Patchy bilateral airspace opacities appear minimally improved over the last 3 days. There is an asymmetric retrocardiac component which may be due to superimposed atelectasis. There are probable small bilateral pleural effusions. No pneumothorax. The bones appear unchanged. IMPRESSION: 1. Slight improvement in bilateral airspace opacities following extubation. Multifocal pneumonia favored based on prior CT appearance. 2. Small residual bilateral pleural effusions. Electronically Signed   By: Richardean Sale M.D.   On: 10/03/2018 12:43   Dg Chest Port 1 View  Result Date: 09/30/2018 CLINICAL DATA:  Expiratory failure EXAM: PORTABLE CHEST 1 VIEW COMPARISON:  09/29/2018 FINDINGS: Endotracheal tube terminates 4.5 cm  above the carina. Multifocal interstitial/airspace opacities, left upper lobe and right lower lobe predominant, favoring multifocal pneumonia over interstitial edema. Small bilateral pleural effusions. Cardiomegaly. Right arm PICC terminates in the upper SVC. Enteric tube courses into the stomach. IMPRESSION: Endotracheal tube terminates 4.5 cm above the carina. Additional support apparatus as above. Multifocal interstitial/airspace opacities, favoring multifocal pneumonia, as above. Small bilateral pleural effusions. Overall appearance is unchanged. Electronically Signed   By: Julian Hy M.D.   On: 09/30/2018 06:01   Dg Chest Port 1 View  Result Date: 09/29/2018 CLINICAL DATA:  Status post PICC placement. EXAM: PORTABLE CHEST 1 VIEW COMPARISON:  Single-view of the chest earlier today. FINDINGS: Right PICC is in place with the tip projecting good position in the mid superior vena cava. Endotracheal tube and NG tube are unchanged and in good position. Left worse than right airspace disease persists. Heart size is enlarged. IMPRESSION: Right PICC projects in good position. ETT and NG tube are unchanged and project in good position. No change in left worse than right airspace disease likely due to pulmonary edema Electronically Signed   By: Inge Rise M.D.   On: 09/29/2018 18:11   Portable Chest X-ray  Result Date: 09/29/2018 CLINICAL DATA:  63 year old female status post intubation and gastric tube placement. EXAM: PORTABLE CHEST 1 VIEW COMPARISON:  CT scan of the chest 09/27/2018 FINDINGS: The tip of the endotracheal tube is 3.7 cm above the carina. The gastric tube takes the expected course along the esophagus. The tip lies below the field of view, below the diaphragm and likely within the stomach. Cardiomegaly and pulmonary vascular congestion bordering on mild edema. No pneumothorax. Probable small bilateral pleural effusions and associated atelectasis worse on the left than the right.  IMPRESSION: 1. The tip of the endotracheal tube is 3.7 cm above the carina. 2. The tip of the gastric tube lies off the field of view, below the diaphragm and likely within the stomach. 3. Cardiomegaly with mild pulmonary edema. 4. Bilateral layering pleural effusions and associated atelectasis worse on the left than the right. Electronically Signed   By: Jacqulynn Cadet M.D.   On: 09/29/2018 11:07   Dg Chest Port 1 View  Result Date: 10/03/2018 CLINICAL DATA:  Shortness of breath EXAM: PORTABLE CHEST 1 VIEW COMPARISON:  09/11/2018 FINDINGS: Cardiomegaly with vascular congestion and bilateral airspace opacities concerning for edema/CHF. Layering bilateral effusions. No acute bony abnormality. IMPRESSION: Moderate CHF.  Layering  bilateral effusions. Electronically Signed   By: Rolm Baptise M.D.   On: 10/03/2018 22:38   Dg Chest Port 1 View  Result Date: 09/11/2018 CLINICAL DATA:  63 year old female with PICC placement EXAM: PORTABLE CHEST 1 VIEW COMPARISON:  09/11/2018 FINDINGS: Clavicles are midline on the current chest x-ray. The tip of the PICC terminates just to the left of midline. Left subclavian central venous catheter again terminates in the region of the brachiocephalic vein. Hazy opacities at the bilateral lung bases are unchanged with blunting of the bilateral costophrenic angles and obscuration of the bilateral hemidiaphragm. No pneumothorax. IMPRESSION: The right upper extremity PICC again terminates with the tip terminating over the midline. The plain film is indeterminate for specifically locating the tip. If there is any further concern, correlation with blood gas or pressure transduction may be useful. Similar appearance of bilateral pleural effusions and associated atelectasis/consolidation Unchanged left subclavian central line Electronically Signed   By: Corrie Mckusick D.O.   On: 09/11/2018 20:42   Dg Chest Port 1 View  Result Date: 09/11/2018 CLINICAL DATA:  63 year old female with  central line placement EXAM: PORTABLE CHEST 1 VIEW COMPARISON:  09/09/2018, 09/08/2018 FINDINGS: Cardiomediastinal silhouette unchanged in size and contour with cardiomegaly. Interval placement of right upper extremity PICC, with the tip terminating on the left aspect of the spine. There is left rotation the patient. Unchanged position of left subclavian central venous catheter with the tip appearing to terminate in the brachiocephalic vein. Hazy opacities of the bilateral lung bases with obscuration of the retrocardiac region of the bilateral cardiophrenic angles. No new airspace opacity. IMPRESSION: Interval placement of right upper extremity PICC. The right rotation of the chest x-ray limits evaluation of the tip of the catheter, which terminates over the left aspect of the spine. While this most likely is projectional, arterial placement cannot be excluded on this plain film. If there is any concern for the location, correlation with blood gas may be useful, or repeat plain film. Unchanged left subclavian central venous catheter. Similar appearance of pleural effusions with associated atelectasis/consolidation. Electronically Signed   By: Corrie Mckusick D.O.   On: 09/11/2018 19:25   [place] Ekg Site Rite  Result Date: 09/29/2018 If Site Rite image not attached, placement could not be confirmed due to current cardiac rhythm.  [place] Ekg Site Rite  Result Date: 09/11/2018 If Site Rite image not attached, placement could not be confirmed due to current cardiac rhythm.   Microbiology Recent Results (from the past 240 hour(s))  Culture, blood (routine x 2)     Status: None   Collection Time: 09/30/18 11:24 AM  Result Value Ref Range Status   Specimen Description BLOOD LEFT ANTECUBITAL  Final   Special Requests   Final    BOTTLES DRAWN AEROBIC ONLY Blood Culture adequate volume   Culture   Final    NO GROWTH 5 DAYS Performed at [facility], [address], [place], [place] [address]    Report  Status 10/05/2018 FINAL  Final  Culture, blood (routine x 2)     Status: None   Collection Time: 09/30/18 11:24 AM  Result Value Ref Range Status   Specimen Description BLOOD LEFT HAND  Final   Special Requests   Final    BOTTLES DRAWN AEROBIC ONLY Blood Culture results may not be optimal due to an inadequate volume of blood received in culture bottles   Culture   Final    NO GROWTH 5 DAYS Performed at [facility]  [facility], [address], [place], [place] [address]    Report Status 10/05/2018 FINAL  Final  Culture, respiratory     Status: None   Collection Time: 09/30/18  2:01 PM  Result Value Ref Range Status   Specimen Description TRACHEAL ASPIRATE  Final   Special Requests Normal  Final   Gram Stain   Final    MODERATE WBC PRESENT, PREDOMINANTLY PMN MODERATE GRAM VARIABLE ROD    Culture   Final    ABUNDANT DIPHTHEROIDS(CORYNEBACTERIUM SPECIES) Standardized susceptibility testing for this organism is not available. Performed at [facility], [place] [address], [place], [place] [address]    Report Status 10/02/2018 FINAL  Final  Novel Coronavirus, NAA (hospital order; send-out to ref lab)     Status: None   Collection Time: 10/03/18 12:03 AM  Result Value Ref Range Status   SARS-CoV-2, NAA NOT DETECTED NOT DETECTED Final    Comment: (NOTE) This test was developed and its performance characteristics determined by [employer]. This test has not been FDA cleared or approved. This test has been authorized by FDA under an Emergency Use Authorization (EUA). This test is only authorized for the duration of time the declaration that circumstances exist justifying the authorization of the emergency use of in vitro diagnostic tests for detection of SARS-CoV-2 virus and/or diagnosis of COVID-19 infection under section 564(b)(1) of the Act, 21 U.S.C. 983JAS-5(K)(5), unless the authorization is terminated or revoked sooner. When diagnostic testing is negative, the possibility  of a false negative result should be considered in the context of a patient's recent exposures and the presence of clinical signs and symptoms consistent with COVID-19. An individual without symptoms of COVID-19 and who is not shedding SARS-CoV-2 virus would expect to have a negative (not detected) result in this assay. Performed  At: [facility] [address] [place], [place] [id number] Rush Farmer MD FX:[id number]    [place]  Final    Comment: Performed at [facility], [place] [address] [place], [place] [address]    Lab Basic Metabolic Panel: Recent Labs  Lab 10/03/18 0449 10/04/18 0605  NA 142 145  K 3.5 3.1*  CL 84* 87*  CO2 48* 48*  GLUCOSE 92 96  BUN 54* 47*  CREATININE 0.90 0.86  CALCIUM 8.6* 8.8*  MG 1.9 1.9  PHOS 3.9 4.4   CBC: Recent Labs  Lab 10/03/18 0449 10/04/18 0605  WBC 6.6 5.8  HGB 8.3* 8.1*  HCT 30.6* 29.7*  MCV 90.5 92.5  PLT 75* 65*   Sepsis Labs: Recent Labs  Lab 10/03/18 0449 10/04/18 0605  WBC 6.6 5.8    Procedures/Operations  As noted above   [facility] 10/09/2018, 10:46 AM

## 2018-10-16 DEATH — deceased

## 2018-11-03 ENCOUNTER — Ambulatory Visit: Payer: Medicare Other | Admitting: Internal Medicine

## 2020-04-24 IMAGING — CT CT ABDOMEN AND PELVIS WITHOUT AND WITH CONTRAST
2 of 9 series · 11 of 46 positions shown, 16 images · IV contrast (omnipaque)
Comparison: Noncontrast AP CT on 09/26/2018, and chest CT on
05/03/2018

CLINICAL DATA: Right renal and pancreatic lesions on recent
noncontrast CT. Cirrhosis. Bilateral pleural effusions.

EXAM:
CT CHEST WITHOUT CONTRAST
CT ABDOMEN AND PELVIS WITH AND WITHOUT CONTRAST
TECHNIQUE: Multidetector CT imaging of the chest was performed prior to
intravenous contrast administration. Multidetector CT imaging of the
abdomen and pelvis was performed following the standard protocol
before and during bolus administration of intravenous contrast.
CONTRAST:  100mL OMNIPAQUE IOHEXOL 300 MG/ML  SOLN

[Series 7: arterial 2.0 cor · coronal · arterial · 0.63mm/px · 3 of 151 slices shown]
[im 38/151  soft-tissue]
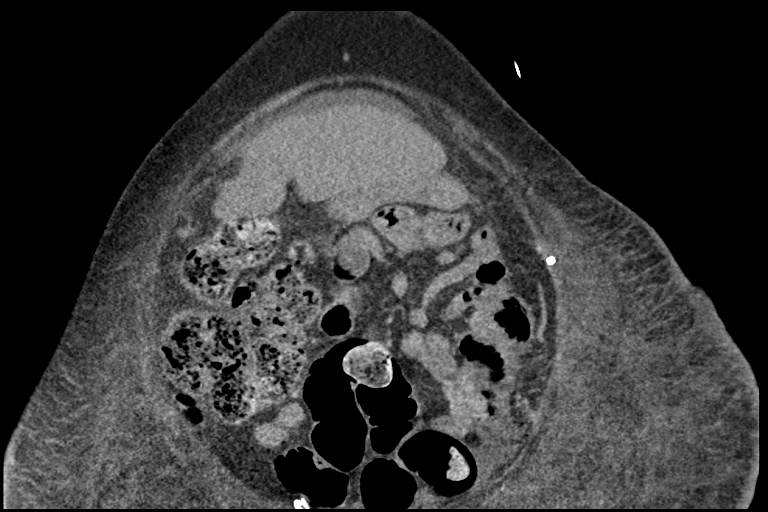
[im 76/151  soft-tissue]
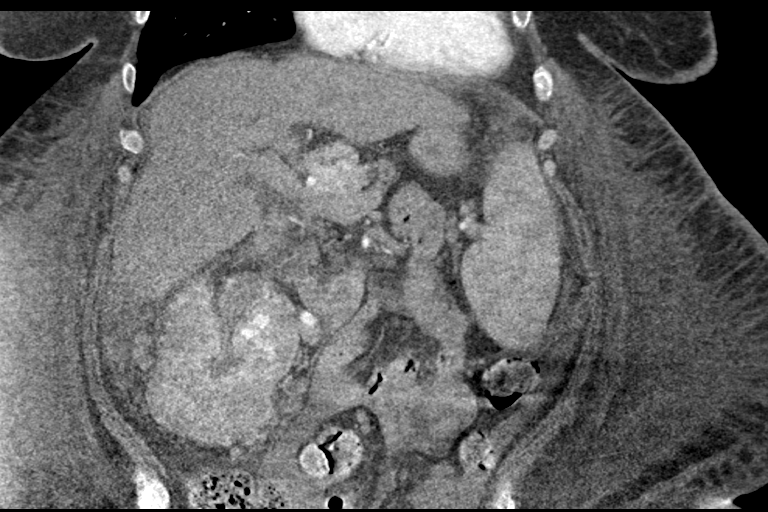
[im 113/151  soft-tissue]
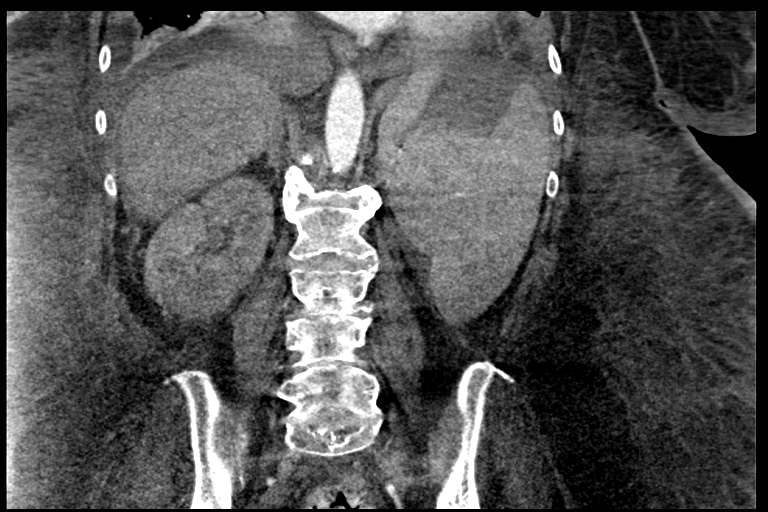

[Series 9: venous 3.0 · axial · portal-venous · 0.91mm/px · z∈[+759,+1116]mm · 8 of 153 slices shown, 13 images]
[im 17/153  soft-tissue]
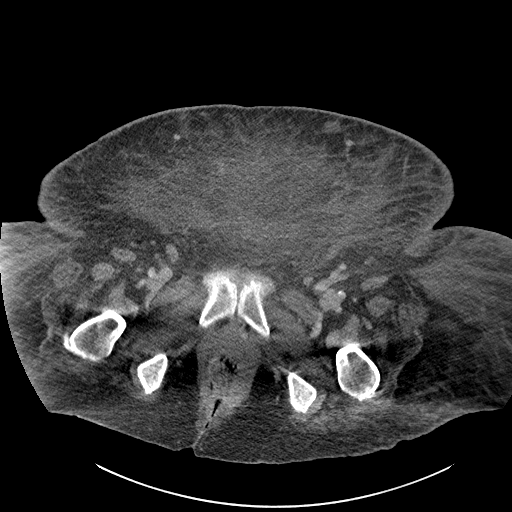
[im 17/153  bone]
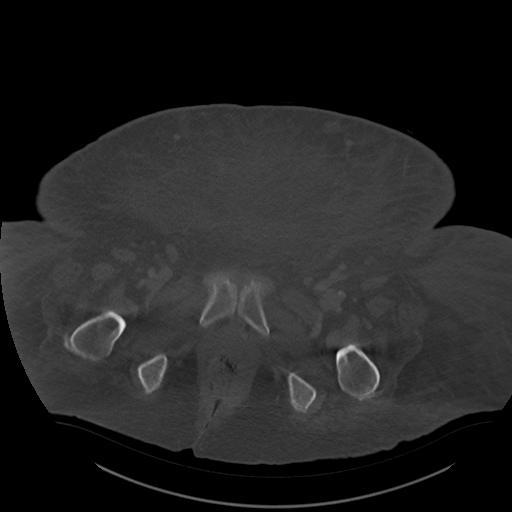
[im 34/153  soft-tissue]
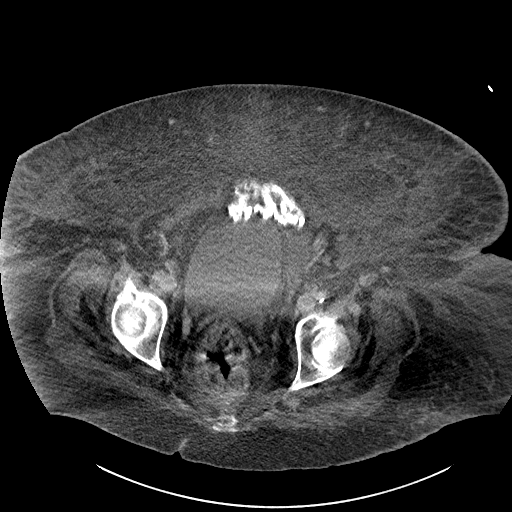
[im 51/153  soft-tissue]
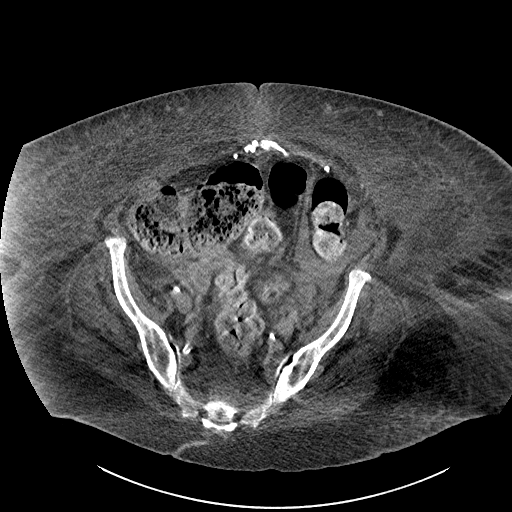
[im 68/153  soft-tissue]
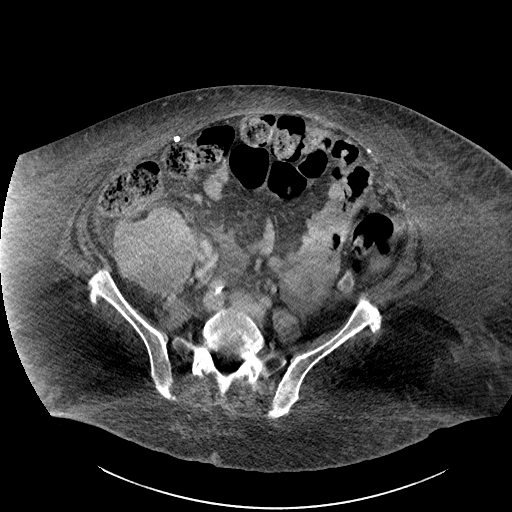
[im 85/153  soft-tissue]
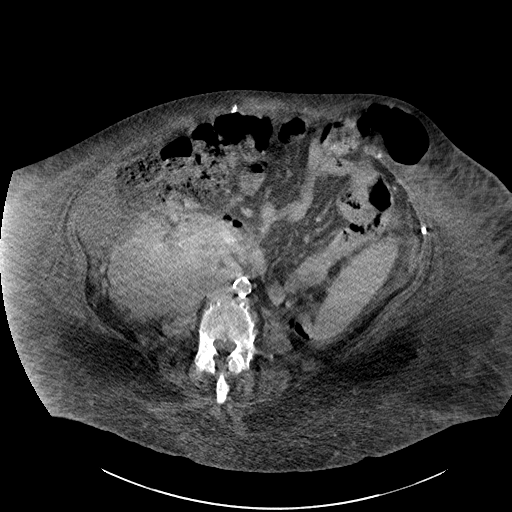
[im 85/153  lung]
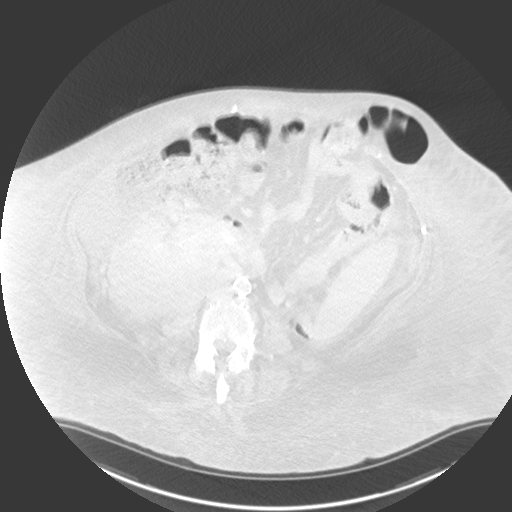
[im 102/153  soft-tissue]
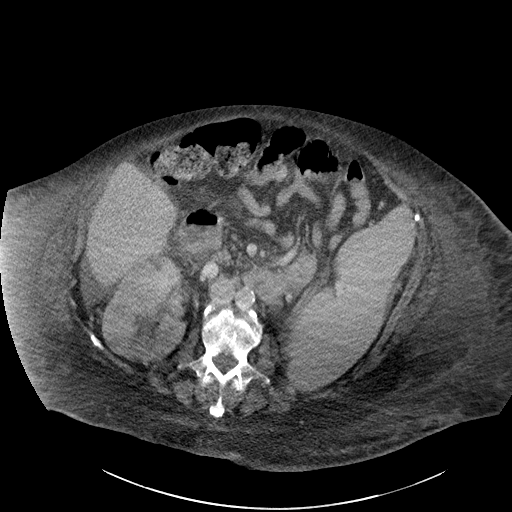
[im 102/153  lung]
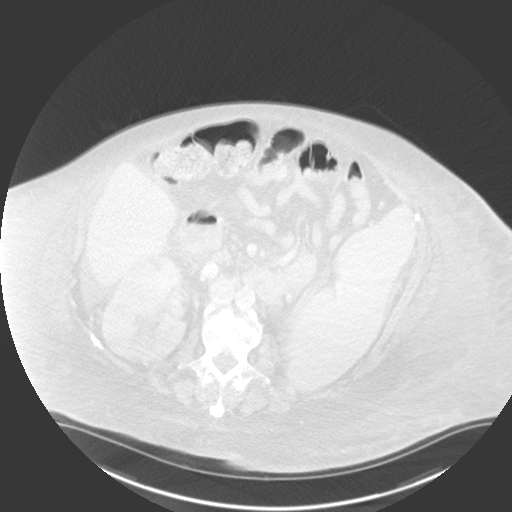
[im 119/153  soft-tissue]
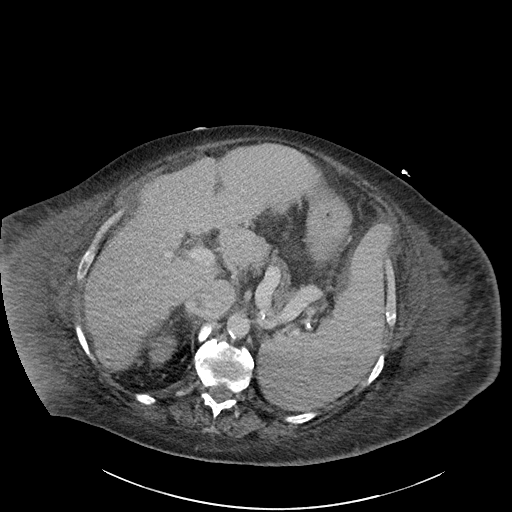
[im 119/153  lung]
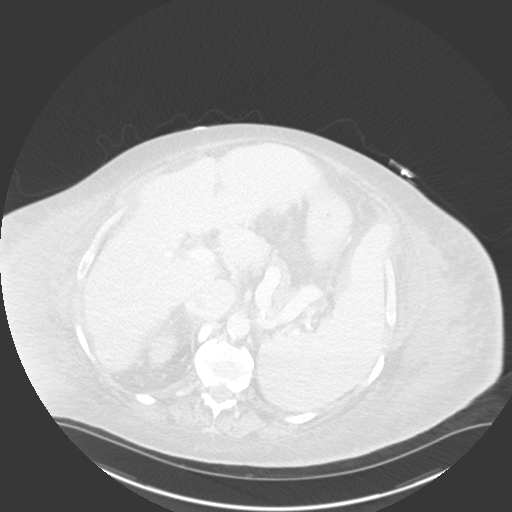
[im 136/153  soft-tissue]
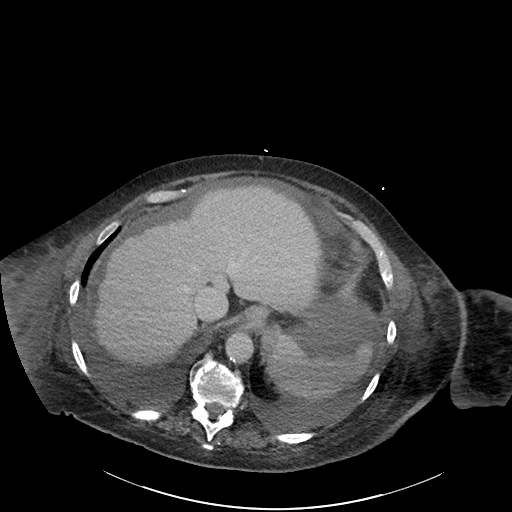
[im 136/153  lung]
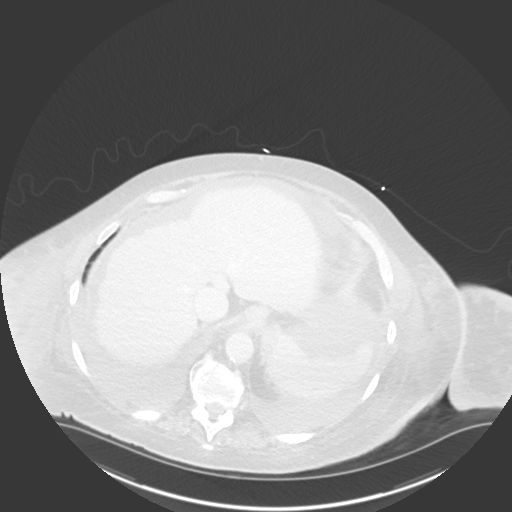

[11 of 46 positions shown; findings below may reference images not displayed]

FINDINGS: CT CHEST FINDINGS

Cardiovascular: No acute findings. Aortic and coronary artery
atherosclerosis.

Mediastinum/Nodes: No masses or pathologically enlarged lymph nodes
identified on this unenhanced exam.

Lungs/Pleura: Moderate bilateral pleural effusions. Compressive
atelectasis of both lung bases. Airspace disease with central air
bronchograms seen in the left upper lobe, suspicious for pneumonia.
Central tracheobronchial airways remain patent.

Musculoskeletal:  No suspicious bone lesions.

CT ABDOMEN AND PELVIS FINDINGS

Hepatobiliary: Hepatic cirrhosis again demonstrated. No liver masses
identified. Prior cholecystectomy. No evidence of biliary
obstruction.

Pancreas: Solid mass is seen involving the pancreatic head which
measures 4.4 x 4.0 cm, which shows arterial phase hyperenhancement.
This favors a pancreatic neuroendocrine tumor or metastasis over
pancreatic carcinoma. No evidence of pancreatic ductal dilatation.

Spleen: Moderate splenomegaly, consistent with portal venous
hypertension.

Adrenals/Urinary Tract: Absence of left kidney again seen. A large
solid mass showing arterial phase hyperenhancement is seen involving
the mid and lower pole of the right kidney. This measures 10.7 x
cm, consistent with renal cell carcinoma. This appears to be
contained within Gerota's fascia. No evidence of hydronephrosis. No
evidence of renal vein or IVC tumor thrombus.

Stomach/Bowel: No evidence of obstruction, inflammatory process or
abnormal fluid collections. Surgical mesh again seen in anterior
abdominal wall. A small left abdominal ventral hernia is again seen
containing a loop of distal transverse colon, and is stable.

Vascular/Lymphatic: No pathologically enlarged lymph nodes. No
abdominal aortic aneurysm. Aortic atherosclerosis.

Reproductive: Prior hysterectomy noted. Adnexal regions are
unremarkable in appearance.

Other: Mild ascites and diffuse mesenteric and body wall edema show
no significant change.

Musculoskeletal:  No suspicious bone lesions identified.
IMPRESSION: 1. 10.7 cm solid mass involving the mid and lower pole of right
kidney, consistent with renal cell carcinoma. No evidence of renal
vein or IVC tumor thrombus.
2. 4.4 cm hypervascular mass in the pancreatic head. This favors
hypervascular metastasis or pancreatic neuroendocrine tumor over
pancreatic adenocarcinoma.
3. Moderate bilateral pleural effusions and compressive atelectasis.
Airspace disease with central air bronchograms in left upper lobe,
suspicious for pneumonia.
4. Hepatic cirrhosis and findings of portal venous hypertension. No
radiographic evidence of hepatic neoplasm.
5. Mild ascites, and diffuse mesenteric and body wall edema.
6. Stable small left abdominal ventral hernia containing loop of
distal transverse colon.
